# Patient Record
Sex: Female | Born: 1940
Health system: Southern US, Community
[De-identification: ages and names within clinical notes are randomized; demographics above are authoritative.]

## PROBLEM LIST (undated history)

## (undated) DIAGNOSIS — E785 Hyperlipidemia, unspecified: Secondary | ICD-10-CM

## (undated) DIAGNOSIS — I471 Supraventricular tachycardia, unspecified: Secondary | ICD-10-CM

## (undated) DIAGNOSIS — R42 Dizziness and giddiness: Secondary | ICD-10-CM

## (undated) DIAGNOSIS — G629 Polyneuropathy, unspecified: Secondary | ICD-10-CM

## (undated) DIAGNOSIS — D649 Anemia, unspecified: Secondary | ICD-10-CM

## (undated) DIAGNOSIS — IMO0001 Reserved for inherently not codable concepts without codable children: Secondary | ICD-10-CM

## (undated) DIAGNOSIS — F329 Major depressive disorder, single episode, unspecified: Secondary | ICD-10-CM

## (undated) DIAGNOSIS — Z8719 Personal history of other diseases of the digestive system: Secondary | ICD-10-CM

## (undated) DIAGNOSIS — G9520 Unspecified cord compression: Secondary | ICD-10-CM

## (undated) DIAGNOSIS — R519 Headache, unspecified: Secondary | ICD-10-CM

## (undated) DIAGNOSIS — F32A Depression, unspecified: Secondary | ICD-10-CM

## (undated) DIAGNOSIS — T4145XA Adverse effect of unspecified anesthetic, initial encounter: Secondary | ICD-10-CM

## (undated) DIAGNOSIS — R112 Nausea with vomiting, unspecified: Secondary | ICD-10-CM

## (undated) DIAGNOSIS — M797 Fibromyalgia: Secondary | ICD-10-CM

## (undated) DIAGNOSIS — K591 Functional diarrhea: Secondary | ICD-10-CM

## (undated) DIAGNOSIS — M1990 Unspecified osteoarthritis, unspecified site: Secondary | ICD-10-CM

## (undated) DIAGNOSIS — F410 Panic disorder [episodic paroxysmal anxiety] without agoraphobia: Secondary | ICD-10-CM

## (undated) DIAGNOSIS — R51 Headache: Secondary | ICD-10-CM

## (undated) DIAGNOSIS — F419 Anxiety disorder, unspecified: Secondary | ICD-10-CM

## (undated) DIAGNOSIS — J189 Pneumonia, unspecified organism: Secondary | ICD-10-CM

## (undated) DIAGNOSIS — E119 Type 2 diabetes mellitus without complications: Secondary | ICD-10-CM

## (undated) DIAGNOSIS — M810 Age-related osteoporosis without current pathological fracture: Secondary | ICD-10-CM

## (undated) DIAGNOSIS — I499 Cardiac arrhythmia, unspecified: Secondary | ICD-10-CM

## (undated) DIAGNOSIS — T8859XA Other complications of anesthesia, initial encounter: Secondary | ICD-10-CM

## (undated) DIAGNOSIS — I4719 Other supraventricular tachycardia: Secondary | ICD-10-CM

## (undated) DIAGNOSIS — I10 Essential (primary) hypertension: Secondary | ICD-10-CM

## (undated) DIAGNOSIS — C801 Malignant (primary) neoplasm, unspecified: Secondary | ICD-10-CM

## (undated) DIAGNOSIS — K219 Gastro-esophageal reflux disease without esophagitis: Secondary | ICD-10-CM

## (undated) DIAGNOSIS — G2581 Restless legs syndrome: Secondary | ICD-10-CM

## (undated) DIAGNOSIS — Z9889 Other specified postprocedural states: Secondary | ICD-10-CM

## (undated) HISTORY — PX: BACK SURGERY: SHX140

## (undated) HISTORY — DX: Dizziness and giddiness: R42

## (undated) HISTORY — PX: KNEE SURGERY: SHX244

## (undated) HISTORY — PX: ABDOMINAL HYSTERECTOMY: SHX81

## (undated) HISTORY — PX: EXCISION MORTON'S NEUROMA: SHX5013

## (undated) HISTORY — PX: UPPER GASTROINTESTINAL ENDOSCOPY: SHX188

## (undated) HISTORY — DX: Age-related osteoporosis without current pathological fracture: M81.0

## (undated) HISTORY — PX: WRIST SURGERY: SHX841

## (undated) HISTORY — PX: NECK SURGERY: SHX720

## (undated) HISTORY — PX: TONSILLECTOMY: SUR1361

## (undated) HISTORY — PX: EYE SURGERY: SHX253

## (undated) HISTORY — PX: FRACTURE SURGERY: SHX138

## (undated) HISTORY — PX: CHOLECYSTECTOMY: SHX55

## (undated) HISTORY — PX: ANKLE SURGERY: SHX546

## (undated) HISTORY — PX: COLONOSCOPY: SHX174

## (undated) HISTORY — PX: ROTATOR CUFF REPAIR: SHX139

---

## 1997-07-09 ENCOUNTER — Ambulatory Visit (HOSPITAL_COMMUNITY): Admission: RE | Admit: 1997-07-09 | Discharge: 1997-07-09 | Payer: Self-pay | Admitting: *Deleted

## 1997-08-18 ENCOUNTER — Ambulatory Visit (HOSPITAL_COMMUNITY): Admission: RE | Admit: 1997-08-18 | Discharge: 1997-08-18 | Payer: Self-pay | Admitting: *Deleted

## 1998-03-27 ENCOUNTER — Emergency Department (HOSPITAL_COMMUNITY): Admission: EM | Admit: 1998-03-27 | Discharge: 1998-03-27 | Payer: Self-pay | Admitting: Emergency Medicine

## 1998-11-05 ENCOUNTER — Encounter: Payer: Self-pay | Admitting: *Deleted

## 1998-11-05 ENCOUNTER — Ambulatory Visit (HOSPITAL_COMMUNITY): Admission: RE | Admit: 1998-11-05 | Discharge: 1998-11-05 | Payer: Self-pay | Admitting: *Deleted

## 1998-11-29 ENCOUNTER — Encounter: Payer: Self-pay | Admitting: Unknown Physician Specialty

## 1998-11-29 ENCOUNTER — Ambulatory Visit (HOSPITAL_COMMUNITY): Admission: RE | Admit: 1998-11-29 | Discharge: 1998-11-29 | Payer: Self-pay | Admitting: Unknown Physician Specialty

## 1999-04-14 ENCOUNTER — Observation Stay (HOSPITAL_COMMUNITY): Admission: EM | Admit: 1999-04-14 | Discharge: 1999-04-15 | Payer: Self-pay | Admitting: Emergency Medicine

## 1999-04-14 ENCOUNTER — Encounter: Payer: Self-pay | Admitting: Emergency Medicine

## 1999-06-24 ENCOUNTER — Encounter: Payer: Self-pay | Admitting: Unknown Physician Specialty

## 1999-06-24 ENCOUNTER — Ambulatory Visit (HOSPITAL_COMMUNITY): Admission: RE | Admit: 1999-06-24 | Discharge: 1999-06-24 | Payer: Self-pay | Admitting: Unknown Physician Specialty

## 1999-08-10 ENCOUNTER — Ambulatory Visit (HOSPITAL_COMMUNITY): Admission: RE | Admit: 1999-08-10 | Discharge: 1999-08-10 | Payer: Self-pay | Admitting: Internal Medicine

## 1999-08-10 ENCOUNTER — Encounter: Payer: Self-pay | Admitting: Internal Medicine

## 1999-11-07 ENCOUNTER — Encounter: Payer: Self-pay | Admitting: *Deleted

## 1999-11-07 ENCOUNTER — Ambulatory Visit (HOSPITAL_COMMUNITY): Admission: RE | Admit: 1999-11-07 | Discharge: 1999-11-07 | Payer: Self-pay | Admitting: *Deleted

## 2000-10-08 ENCOUNTER — Encounter: Admission: RE | Admit: 2000-10-08 | Discharge: 2000-10-25 | Payer: Self-pay | Admitting: Family Medicine

## 2000-10-16 ENCOUNTER — Emergency Department (HOSPITAL_COMMUNITY): Admission: EM | Admit: 2000-10-16 | Discharge: 2000-10-17 | Payer: Self-pay | Admitting: Emergency Medicine

## 2000-11-14 ENCOUNTER — Encounter: Payer: Self-pay | Admitting: Neurological Surgery

## 2000-11-15 ENCOUNTER — Observation Stay (HOSPITAL_COMMUNITY): Admission: RE | Admit: 2000-11-15 | Discharge: 2000-11-16 | Payer: Self-pay | Admitting: Neurological Surgery

## 2000-11-15 ENCOUNTER — Encounter: Payer: Self-pay | Admitting: Neurological Surgery

## 2000-12-12 ENCOUNTER — Encounter: Admission: RE | Admit: 2000-12-12 | Discharge: 2000-12-12 | Payer: Self-pay | Admitting: Neurological Surgery

## 2000-12-12 ENCOUNTER — Encounter: Payer: Self-pay | Admitting: Neurological Surgery

## 2001-06-12 ENCOUNTER — Emergency Department (HOSPITAL_COMMUNITY): Admission: EM | Admit: 2001-06-12 | Discharge: 2001-06-12 | Payer: Self-pay | Admitting: Emergency Medicine

## 2001-06-18 ENCOUNTER — Ambulatory Visit (HOSPITAL_BASED_OUTPATIENT_CLINIC_OR_DEPARTMENT_OTHER): Admission: RE | Admit: 2001-06-18 | Discharge: 2001-06-18 | Payer: Self-pay | Admitting: Orthopedic Surgery

## 2001-07-30 ENCOUNTER — Encounter: Payer: Self-pay | Admitting: *Deleted

## 2001-07-30 ENCOUNTER — Encounter: Admission: RE | Admit: 2001-07-30 | Discharge: 2001-07-30 | Payer: Self-pay | Admitting: *Deleted

## 2001-09-03 ENCOUNTER — Emergency Department (HOSPITAL_COMMUNITY): Admission: EM | Admit: 2001-09-03 | Discharge: 2001-09-03 | Payer: Self-pay | Admitting: Emergency Medicine

## 2001-09-03 ENCOUNTER — Encounter: Payer: Self-pay | Admitting: Emergency Medicine

## 2001-09-05 ENCOUNTER — Encounter: Payer: Self-pay | Admitting: *Deleted

## 2001-09-05 ENCOUNTER — Encounter: Admission: RE | Admit: 2001-09-05 | Discharge: 2001-09-05 | Payer: Self-pay | Admitting: *Deleted

## 2001-09-10 ENCOUNTER — Encounter: Payer: Self-pay | Admitting: *Deleted

## 2001-09-11 ENCOUNTER — Encounter (INDEPENDENT_AMBULATORY_CARE_PROVIDER_SITE_OTHER): Payer: Self-pay | Admitting: Specialist

## 2001-09-11 ENCOUNTER — Ambulatory Visit (HOSPITAL_COMMUNITY): Admission: RE | Admit: 2001-09-11 | Discharge: 2001-09-12 | Payer: Self-pay | Admitting: *Deleted

## 2001-09-11 ENCOUNTER — Encounter: Payer: Self-pay | Admitting: *Deleted

## 2002-05-26 ENCOUNTER — Encounter: Payer: Self-pay | Admitting: Emergency Medicine

## 2002-05-26 ENCOUNTER — Emergency Department (HOSPITAL_COMMUNITY): Admission: EM | Admit: 2002-05-26 | Discharge: 2002-05-26 | Payer: Self-pay

## 2002-08-22 ENCOUNTER — Encounter: Payer: Self-pay | Admitting: *Deleted

## 2002-08-22 ENCOUNTER — Encounter: Admission: RE | Admit: 2002-08-22 | Discharge: 2002-08-22 | Payer: Self-pay | Admitting: *Deleted

## 2002-11-01 ENCOUNTER — Encounter: Payer: Self-pay | Admitting: Emergency Medicine

## 2002-11-01 ENCOUNTER — Emergency Department (HOSPITAL_COMMUNITY): Admission: EM | Admit: 2002-11-01 | Discharge: 2002-11-01 | Payer: Self-pay | Admitting: Emergency Medicine

## 2002-12-31 ENCOUNTER — Ambulatory Visit (HOSPITAL_COMMUNITY): Admission: RE | Admit: 2002-12-31 | Discharge: 2002-12-31 | Payer: Self-pay | Admitting: Family Medicine

## 2003-09-03 ENCOUNTER — Ambulatory Visit (HOSPITAL_COMMUNITY): Admission: RE | Admit: 2003-09-03 | Discharge: 2003-09-03 | Payer: Self-pay | Admitting: Internal Medicine

## 2003-12-28 ENCOUNTER — Ambulatory Visit (HOSPITAL_COMMUNITY): Admission: RE | Admit: 2003-12-28 | Discharge: 2003-12-28 | Payer: Self-pay | Admitting: Specialist

## 2004-01-25 ENCOUNTER — Ambulatory Visit (HOSPITAL_COMMUNITY): Admission: RE | Admit: 2004-01-25 | Discharge: 2004-01-25 | Payer: Self-pay | Admitting: Specialist

## 2004-01-27 ENCOUNTER — Ambulatory Visit: Payer: Self-pay | Admitting: Family Medicine

## 2004-04-27 ENCOUNTER — Ambulatory Visit: Payer: Self-pay | Admitting: Family Medicine

## 2004-05-30 ENCOUNTER — Emergency Department (HOSPITAL_COMMUNITY): Admission: EM | Admit: 2004-05-30 | Discharge: 2004-05-30 | Payer: Self-pay | Admitting: Emergency Medicine

## 2004-07-05 ENCOUNTER — Ambulatory Visit: Payer: Self-pay | Admitting: Family Medicine

## 2004-07-27 ENCOUNTER — Ambulatory Visit: Payer: Self-pay | Admitting: Family Medicine

## 2004-09-14 ENCOUNTER — Ambulatory Visit: Payer: Self-pay | Admitting: Family Medicine

## 2004-10-04 ENCOUNTER — Ambulatory Visit: Payer: Self-pay | Admitting: Family Medicine

## 2004-12-13 ENCOUNTER — Ambulatory Visit: Payer: Self-pay | Admitting: Family Medicine

## 2004-12-16 ENCOUNTER — Ambulatory Visit (HOSPITAL_COMMUNITY): Admission: RE | Admit: 2004-12-16 | Discharge: 2004-12-16 | Payer: Self-pay | Admitting: *Deleted

## 2004-12-20 ENCOUNTER — Encounter: Admission: RE | Admit: 2004-12-20 | Discharge: 2004-12-20 | Payer: Self-pay | Admitting: Family Medicine

## 2004-12-27 ENCOUNTER — Ambulatory Visit: Payer: Self-pay | Admitting: Family Medicine

## 2005-01-11 ENCOUNTER — Ambulatory Visit: Payer: Self-pay | Admitting: Family Medicine

## 2005-02-22 ENCOUNTER — Ambulatory Visit: Payer: Self-pay | Admitting: Family Medicine

## 2005-03-20 ENCOUNTER — Inpatient Hospital Stay (HOSPITAL_COMMUNITY): Admission: RE | Admit: 2005-03-20 | Discharge: 2005-03-22 | Payer: Self-pay | Admitting: Neurological Surgery

## 2005-05-30 ENCOUNTER — Encounter: Admission: RE | Admit: 2005-05-30 | Discharge: 2005-06-15 | Payer: Self-pay | Admitting: Neurological Surgery

## 2005-05-31 ENCOUNTER — Ambulatory Visit: Payer: Self-pay | Admitting: Family Medicine

## 2005-06-26 ENCOUNTER — Encounter: Admission: RE | Admit: 2005-06-26 | Discharge: 2005-06-26 | Payer: Self-pay | Admitting: Family Medicine

## 2005-09-07 ENCOUNTER — Ambulatory Visit: Payer: Self-pay | Admitting: Family Medicine

## 2005-12-18 ENCOUNTER — Ambulatory Visit: Payer: Self-pay | Admitting: Family Medicine

## 2006-02-14 ENCOUNTER — Encounter: Admission: RE | Admit: 2006-02-14 | Discharge: 2006-02-14 | Payer: Self-pay | Admitting: Family Medicine

## 2006-02-15 ENCOUNTER — Ambulatory Visit: Payer: Self-pay | Admitting: Family Medicine

## 2006-03-19 ENCOUNTER — Ambulatory Visit: Payer: Self-pay | Admitting: Family Medicine

## 2006-04-11 ENCOUNTER — Ambulatory Visit: Payer: Self-pay | Admitting: Family Medicine

## 2006-06-27 ENCOUNTER — Ambulatory Visit: Payer: Self-pay | Admitting: Family Medicine

## 2006-09-12 ENCOUNTER — Emergency Department (HOSPITAL_COMMUNITY): Admission: EM | Admit: 2006-09-12 | Discharge: 2006-09-12 | Payer: Self-pay | Admitting: Emergency Medicine

## 2007-08-18 ENCOUNTER — Emergency Department (HOSPITAL_COMMUNITY): Admission: EM | Admit: 2007-08-18 | Discharge: 2007-08-18 | Payer: Self-pay | Admitting: Family Medicine

## 2007-09-24 ENCOUNTER — Encounter: Admission: RE | Admit: 2007-09-24 | Discharge: 2007-09-24 | Payer: Self-pay | Admitting: Family Medicine

## 2008-09-24 ENCOUNTER — Encounter: Admission: RE | Admit: 2008-09-24 | Discharge: 2008-09-24 | Payer: Self-pay | Admitting: Family Medicine

## 2008-10-20 ENCOUNTER — Ambulatory Visit (HOSPITAL_COMMUNITY): Admission: RE | Admit: 2008-10-20 | Discharge: 2008-10-20 | Payer: Self-pay | Admitting: Family Medicine

## 2008-10-20 ENCOUNTER — Encounter: Payer: Self-pay | Admitting: Family Medicine

## 2009-03-14 ENCOUNTER — Inpatient Hospital Stay (HOSPITAL_COMMUNITY): Admission: EM | Admit: 2009-03-14 | Discharge: 2009-03-16 | Payer: Self-pay | Admitting: Emergency Medicine

## 2009-09-30 ENCOUNTER — Encounter: Admission: RE | Admit: 2009-09-30 | Discharge: 2009-09-30 | Payer: Self-pay | Admitting: *Deleted

## 2010-03-06 ENCOUNTER — Encounter: Payer: Self-pay | Admitting: Family Medicine

## 2010-04-17 ENCOUNTER — Emergency Department (INDEPENDENT_AMBULATORY_CARE_PROVIDER_SITE_OTHER): Payer: Medicare Other

## 2010-04-17 ENCOUNTER — Emergency Department (HOSPITAL_BASED_OUTPATIENT_CLINIC_OR_DEPARTMENT_OTHER)
Admission: EM | Admit: 2010-04-17 | Discharge: 2010-04-17 | Disposition: A | Discharge status: Home / Self Care | Payer: Medicare Other | Attending: Emergency Medicine | Admitting: Emergency Medicine

## 2010-04-17 DIAGNOSIS — R197 Diarrhea, unspecified: Secondary | ICD-10-CM | POA: Insufficient documentation

## 2010-04-17 DIAGNOSIS — IMO0001 Reserved for inherently not codable concepts without codable children: Secondary | ICD-10-CM | POA: Insufficient documentation

## 2010-04-17 DIAGNOSIS — R110 Nausea: Secondary | ICD-10-CM

## 2010-04-17 DIAGNOSIS — R109 Unspecified abdominal pain: Secondary | ICD-10-CM

## 2010-04-17 DIAGNOSIS — Z79899 Other long term (current) drug therapy: Secondary | ICD-10-CM | POA: Insufficient documentation

## 2010-04-17 DIAGNOSIS — E1169 Type 2 diabetes mellitus with other specified complication: Secondary | ICD-10-CM | POA: Insufficient documentation

## 2010-04-17 LAB — URINE MICROSCOPIC-ADD ON

## 2010-04-17 LAB — URINALYSIS, ROUTINE W REFLEX MICROSCOPIC
Bilirubin Urine: NEGATIVE
Glucose, UA: 250 mg/dL — AB
Hgb urine dipstick: NEGATIVE
Ketones, ur: NEGATIVE mg/dL
Leukocytes, UA: NEGATIVE
Nitrite: NEGATIVE
Protein, ur: 30 mg/dL — AB
Specific Gravity, Urine: 1.015 (ref 1.005–1.030)
Urobilinogen, UA: 0.2 mg/dL (ref 0.0–1.0)
pH: 7.5 (ref 5.0–8.0)

## 2010-04-17 LAB — CBC
HCT: 41.4 % (ref 36.0–46.0)
Hemoglobin: 14.3 g/dL (ref 12.0–15.0)
MCH: 29.2 pg (ref 26.0–34.0)
MCHC: 34.5 g/dL (ref 30.0–36.0)
MCV: 84.5 fL (ref 78.0–100.0)
Platelets: 286 10*3/uL (ref 150–400)
RBC: 4.9 MIL/uL (ref 3.87–5.11)
RDW: 12.7 % (ref 11.5–15.5)
WBC: 9.5 10*3/uL (ref 4.0–10.5)

## 2010-04-17 LAB — DIFFERENTIAL
Basophils Absolute: 0 10*3/uL (ref 0.0–0.1)
Basophils Relative: 0 % (ref 0–1)
Eosinophils Absolute: 0.1 10*3/uL (ref 0.0–0.7)
Eosinophils Relative: 1 % (ref 0–5)
Lymphocytes Relative: 24 % (ref 12–46)
Lymphs Abs: 2.3 10*3/uL (ref 0.7–4.0)
Monocytes Absolute: 0.6 10*3/uL (ref 0.1–1.0)
Monocytes Relative: 7 % (ref 3–12)
Neutro Abs: 6.5 10*3/uL (ref 1.7–7.7)
Neutrophils Relative %: 68 % (ref 43–77)

## 2010-04-17 LAB — LIPASE, BLOOD: Lipase: 133 U/L (ref 23–300)

## 2010-04-17 LAB — COMPREHENSIVE METABOLIC PANEL
ALT: 39 U/L — ABNORMAL HIGH (ref 0–35)
AST: 30 U/L (ref 0–37)
Albumin: 4 g/dL (ref 3.5–5.2)
Alkaline Phosphatase: 73 U/L (ref 39–117)
BUN: 13 mg/dL (ref 6–23)
CO2: 22 mEq/L (ref 19–32)
Calcium: 9.5 mg/dL (ref 8.4–10.5)
Chloride: 104 mEq/L (ref 96–112)
Creatinine, Ser: 0.6 mg/dL (ref 0.4–1.2)
GFR calc Af Amer: >60 mL/min (ref >60)
GFR calc non Af Amer: >60 mL/min (ref >60)
Glucose, Bld: 223 mg/dL — ABNORMAL HIGH (ref 70–99)
Potassium: 4.4 mEq/L (ref 3.5–5.1)
Sodium: 137 mEq/L (ref 135–145)
Total Bilirubin: 0.8 mg/dL (ref 0.3–1.2)
Total Protein: 7.1 g/dL (ref 6.0–8.3)

## 2010-04-17 LAB — DIGOXIN LEVEL: Digoxin Level: 0.9 ng/mL (ref 0.8–2.0)

## 2010-04-20 ENCOUNTER — Emergency Department (HOSPITAL_BASED_OUTPATIENT_CLINIC_OR_DEPARTMENT_OTHER)
Admission: EM | Admit: 2010-04-20 | Discharge: 2010-04-20 | Disposition: A | Discharge status: Home / Self Care | Payer: Medicare Other | Attending: Emergency Medicine | Admitting: Emergency Medicine

## 2010-04-20 DIAGNOSIS — R112 Nausea with vomiting, unspecified: Secondary | ICD-10-CM | POA: Insufficient documentation

## 2010-04-20 DIAGNOSIS — R197 Diarrhea, unspecified: Secondary | ICD-10-CM | POA: Insufficient documentation

## 2010-04-20 DIAGNOSIS — Z79899 Other long term (current) drug therapy: Secondary | ICD-10-CM | POA: Insufficient documentation

## 2010-04-20 DIAGNOSIS — E119 Type 2 diabetes mellitus without complications: Secondary | ICD-10-CM | POA: Insufficient documentation

## 2010-04-20 DIAGNOSIS — IMO0001 Reserved for inherently not codable concepts without codable children: Secondary | ICD-10-CM | POA: Insufficient documentation

## 2010-04-20 LAB — COMPREHENSIVE METABOLIC PANEL
ALT: 38 U/L — ABNORMAL HIGH (ref 0–35)
AST: 31 U/L (ref 0–37)
Albumin: 4 g/dL (ref 3.5–5.2)
Alkaline Phosphatase: 75 U/L (ref 39–117)
BUN: 18 mg/dL (ref 6–23)
CO2: 23 mEq/L (ref 19–32)
Calcium: 9.5 mg/dL (ref 8.4–10.5)
Chloride: 104 mEq/L (ref 96–112)
Creatinine, Ser: 0.7 mg/dL (ref 0.4–1.2)
GFR calc Af Amer: >60 mL/min (ref >60)
GFR calc non Af Amer: >60 mL/min (ref >60)
Glucose, Bld: 197 mg/dL — ABNORMAL HIGH (ref 70–99)
Potassium: 4.3 mEq/L (ref 3.5–5.1)
Sodium: 140 mEq/L (ref 135–145)
Total Bilirubin: 0.6 mg/dL (ref 0.3–1.2)
Total Protein: 7 g/dL (ref 6.0–8.3)

## 2010-04-20 LAB — LIPASE, BLOOD: Lipase: 102 U/L (ref 23–300)

## 2010-05-02 LAB — DIFFERENTIAL
Basophils Absolute: 0 10*3/uL (ref 0.0–0.1)
Basophils Relative: 1 % (ref 0–1)
Eosinophils Absolute: 0.2 10*3/uL (ref 0.0–0.7)
Eosinophils Relative: 3 % (ref 0–5)
Lymphocytes Relative: 34 % (ref 12–46)
Lymphs Abs: 2.7 10*3/uL (ref 0.7–4.0)
Monocytes Absolute: 0.6 10*3/uL (ref 0.1–1.0)
Monocytes Relative: 7 % (ref 3–12)
Neutro Abs: 4.3 10*3/uL (ref 1.7–7.7)
Neutrophils Relative %: 55 % (ref 43–77)

## 2010-05-02 LAB — GLUCOSE, CAPILLARY
Glucose-Capillary: 131 mg/dL — ABNORMAL HIGH (ref 70–99)
Glucose-Capillary: 169 mg/dL — ABNORMAL HIGH (ref 70–99)
Glucose-Capillary: 195 mg/dL — ABNORMAL HIGH (ref 70–99)
Glucose-Capillary: 221 mg/dL — ABNORMAL HIGH (ref 70–99)

## 2010-05-02 LAB — HEPARIN LEVEL (UNFRACTIONATED)
Heparin Unfractionated: 0.14 IU/mL — ABNORMAL LOW (ref 0.30–0.70)
Heparin Unfractionated: <0.1 IU/mL — ABNORMAL LOW (ref 0.30–0.70)

## 2010-05-02 LAB — CARDIAC PANEL(CRET KIN+CKTOT+MB+TROPI)
CK, MB: 1 ng/mL (ref 0.3–4.0)
CK, MB: 1.1 ng/mL (ref 0.3–4.0)
Relative Index: INVALID (ref 0.0–2.5)
Relative Index: INVALID (ref 0.0–2.5)
Total CK: 58 U/L (ref 7–177)
Total CK: 66 U/L (ref 7–177)
Troponin I: 0.01 ng/mL (ref 0.00–0.06)
Troponin I: 0.01 ng/mL (ref 0.00–0.06)

## 2010-05-02 LAB — POCT CARDIAC MARKERS
CKMB, poc: <1 ng/mL — ABNORMAL LOW (ref 1.0–8.0)
Myoglobin, poc: 42.2 ng/mL (ref 12–200)
Troponin i, poc: <0.05 ng/mL (ref 0.00–0.09)

## 2010-05-02 LAB — CK TOTAL AND CKMB (NOT AT ARMC)
CK, MB: 1.2 ng/mL (ref 0.3–4.0)
Relative Index: INVALID (ref 0.0–2.5)
Total CK: 66 U/L (ref 7–177)

## 2010-05-02 LAB — CBC
HCT: 36.9 % (ref 36.0–46.0)
HCT: 39.1 % (ref 36.0–46.0)
Hemoglobin: 12.5 g/dL (ref 12.0–15.0)
Hemoglobin: 13.2 g/dL (ref 12.0–15.0)
MCHC: 33.8 g/dL (ref 30.0–36.0)
MCHC: 33.9 g/dL (ref 30.0–36.0)
MCV: 88.3 fL (ref 78.0–100.0)
MCV: 88.3 fL (ref 78.0–100.0)
Platelets: 213 10*3/uL (ref 150–400)
Platelets: 217 10*3/uL (ref 150–400)
RBC: 4.18 MIL/uL (ref 3.87–5.11)
RBC: 4.43 MIL/uL (ref 3.87–5.11)
RDW: 13.2 % (ref 11.5–15.5)
RDW: 13.5 % (ref 11.5–15.5)
WBC: 7.8 10*3/uL (ref 4.0–10.5)
WBC: 9 10*3/uL (ref 4.0–10.5)

## 2010-05-02 LAB — COMPREHENSIVE METABOLIC PANEL
ALT: 38 U/L — ABNORMAL HIGH (ref 0–35)
AST: 34 U/L (ref 0–37)
Albumin: 3.7 g/dL (ref 3.5–5.2)
Alkaline Phosphatase: 69 U/L (ref 39–117)
BUN: 13 mg/dL (ref 6–23)
CO2: 26 mEq/L (ref 19–32)
Calcium: 9.2 mg/dL (ref 8.4–10.5)
Chloride: 104 mEq/L (ref 96–112)
Creatinine, Ser: 0.73 mg/dL (ref 0.4–1.2)
GFR calc Af Amer: >60 mL/min (ref >60)
GFR calc non Af Amer: >60 mL/min (ref >60)
Glucose, Bld: 197 mg/dL — ABNORMAL HIGH (ref 70–99)
Potassium: 3.7 mEq/L (ref 3.5–5.1)
Sodium: 138 mEq/L (ref 135–145)
Total Bilirubin: 0.7 mg/dL (ref 0.3–1.2)
Total Protein: 6.2 g/dL (ref 6.0–8.3)

## 2010-05-02 LAB — POCT I-STAT, CHEM 8
BUN: 14 mg/dL (ref 6–23)
Calcium, Ion: 1.15 mmol/L (ref 1.12–1.32)
Chloride: 105 mEq/L (ref 96–112)
Creatinine, Ser: 0.5 mg/dL (ref 0.4–1.2)
Glucose, Bld: 111 mg/dL — ABNORMAL HIGH (ref 70–99)
HCT: 39 % (ref 36.0–46.0)
Hemoglobin: 13.3 g/dL (ref 12.0–15.0)
Potassium: 3.7 mEq/L (ref 3.5–5.1)
Sodium: 138 mEq/L (ref 135–145)
TCO2: 27 mmol/L (ref 0–100)

## 2010-05-02 LAB — LIPID PANEL
Cholesterol: 172 mg/dL (ref 0–200)
HDL: 27 mg/dL — ABNORMAL LOW (ref >39)
LDL Cholesterol: UNDETERMINED mg/dL (ref 0–99)
Total CHOL/HDL Ratio: 6.4 RATIO
Triglycerides: 741 mg/dL — ABNORMAL HIGH (ref <150)
VLDL: UNDETERMINED mg/dL (ref 0–40)

## 2010-05-02 LAB — APTT: aPTT: 48 seconds — ABNORMAL HIGH (ref 24–37)

## 2010-05-02 LAB — BASIC METABOLIC PANEL
BUN: 14 mg/dL (ref 6–23)
CO2: 22 mEq/L (ref 19–32)
Calcium: 9.1 mg/dL (ref 8.4–10.5)
Chloride: 104 mEq/L (ref 96–112)
Creatinine, Ser: 0.66 mg/dL (ref 0.4–1.2)
GFR calc Af Amer: >60 mL/min (ref >60)
GFR calc non Af Amer: >60 mL/min (ref >60)
Glucose, Bld: 169 mg/dL — ABNORMAL HIGH (ref 70–99)
Potassium: 4 mEq/L (ref 3.5–5.1)
Sodium: 136 mEq/L (ref 135–145)

## 2010-05-02 LAB — HEMOGLOBIN A1C
Hgb A1c MFr Bld: 7.9 % — ABNORMAL HIGH (ref 4.6–6.1)
Mean Plasma Glucose: 180 mg/dL

## 2010-05-02 LAB — TSH: TSH: 1.075 u[IU]/mL (ref 0.350–4.500)

## 2010-05-02 LAB — PROTIME-INR
INR: 0.91 (ref 0.00–1.49)
Prothrombin Time: 12.2 seconds (ref 11.6–15.2)

## 2010-05-02 LAB — TROPONIN I: Troponin I: 0.02 ng/mL (ref 0.00–0.06)

## 2010-05-06 LAB — GLUCOSE, CAPILLARY
Glucose-Capillary: 161 mg/dL — ABNORMAL HIGH (ref 70–99)
Glucose-Capillary: 210 mg/dL — ABNORMAL HIGH (ref 70–99)

## 2010-06-06 ENCOUNTER — Emergency Department (HOSPITAL_COMMUNITY)
Admission: EM | Admit: 2010-06-06 | Discharge: 2010-06-06 | Disposition: A | Discharge status: Home / Self Care | Payer: Medicare Other | Attending: Emergency Medicine | Admitting: Emergency Medicine

## 2010-06-06 DIAGNOSIS — K5289 Other specified noninfective gastroenteritis and colitis: Secondary | ICD-10-CM | POA: Insufficient documentation

## 2010-06-06 DIAGNOSIS — E860 Dehydration: Secondary | ICD-10-CM | POA: Insufficient documentation

## 2010-06-06 LAB — COMPREHENSIVE METABOLIC PANEL
ALT: 36 U/L — ABNORMAL HIGH (ref 0–35)
AST: 39 U/L — ABNORMAL HIGH (ref 0–37)
Albumin: 4.2 g/dL (ref 3.5–5.2)
Alkaline Phosphatase: 62 U/L (ref 39–117)
BUN: 21 mg/dL (ref 6–23)
CO2: 22 mEq/L (ref 19–32)
Calcium: 10.2 mg/dL (ref 8.4–10.5)
Chloride: 101 mEq/L (ref 96–112)
Creatinine, Ser: 1.44 mg/dL — ABNORMAL HIGH (ref 0.4–1.2)
GFR calc Af Amer: 44 mL/min — ABNORMAL LOW (ref >60)
GFR calc non Af Amer: 36 mL/min — ABNORMAL LOW (ref >60)
Glucose, Bld: 246 mg/dL — ABNORMAL HIGH (ref 70–99)
Potassium: 4.4 mEq/L (ref 3.5–5.1)
Sodium: 135 mEq/L (ref 135–145)
Total Bilirubin: 0.9 mg/dL (ref 0.3–1.2)
Total Protein: 7.4 g/dL (ref 6.0–8.3)

## 2010-06-06 LAB — DIFFERENTIAL
Basophils Absolute: 0 10*3/uL (ref 0.0–0.1)
Basophils Relative: 0 % (ref 0–1)
Eosinophils Absolute: 0.1 10*3/uL (ref 0.0–0.7)
Eosinophils Relative: 0 % (ref 0–5)
Lymphocytes Relative: 5 % — ABNORMAL LOW (ref 12–46)
Lymphs Abs: 1.1 10*3/uL (ref 0.7–4.0)
Monocytes Absolute: 1.4 10*3/uL — ABNORMAL HIGH (ref 0.1–1.0)
Monocytes Relative: 7 % (ref 3–12)
Neutro Abs: 18.5 10*3/uL — ABNORMAL HIGH (ref 1.7–7.7)
Neutrophils Relative %: 88 % — ABNORMAL HIGH (ref 43–77)

## 2010-06-06 LAB — CBC
HCT: 45.9 % (ref 36.0–46.0)
Hemoglobin: 15.5 g/dL — ABNORMAL HIGH (ref 12.0–15.0)
MCH: 29.4 pg (ref 26.0–34.0)
MCHC: 33.8 g/dL (ref 30.0–36.0)
MCV: 87.1 fL (ref 78.0–100.0)
Platelets: 294 10*3/uL (ref 150–400)
RBC: 5.27 MIL/uL — ABNORMAL HIGH (ref 3.87–5.11)
RDW: 12.7 % (ref 11.5–15.5)
WBC: 21.1 10*3/uL — ABNORMAL HIGH (ref 4.0–10.5)

## 2010-07-01 NOTE — H&P (Signed)
[place]. [facility]  Patient:    [name], DUQUETTE                        MRN: [id number] Adm. Date:  [id number] Attending:  Doug Sou CC:         Dr. Dewaine Conger                         History and Physical  REASON FOR ADMISSION: Chest discomfort.  HISTORY:  This 70 year old female has a long-standing history of severe anxiety and tenseness.  She has a history of PAT, which has been treated medically.  She has been under marked situational stress.  She has had rare episodes of PAT.  She has had recent fibromyalgia and was treated by Dr. Estill Bakes recently with some prednisone and has been on Vioxx for approximately one year.  She had the onset of lower sternal and epigastric and mid-sternal chest discomfort described as heaviness, pressure, and tightness.  She uses the words diaphoresis, paresthesia, and sweaty when describing her pain.  She took a nitroglycerin that was in the house that belonged to her mother with relief of the pain.  Her daughter called EMS and she was transported here.  The symptoms lasted around 20 minutes and were nonradiating.  She described sweatiness in both extremities at the time.  The emergency room doctor felt that she should be admitted to be ruled out for a myocardial infarction.  PAST MEDICAL HISTORY:  No diabetes, hiatal hernia, or hypertension.   Does have  fibromyalgia.  PAST SURGICAL HISTORY: 1. Vaginal hysterectomy. 2. Wrist surgery.  ALLERGIES:  None.  CURRENT MEDICATIONS: 1. Calan SR 120 mg b.i.d. 2. Lanoxin 0.25 mg daily. 3. Baby aspirin daily. 4. Vioxx daily. 5. Prozac 40 mg daily.  FAMILY HISTORY:  Mother has had a stroke after a catheterization previously that concerns her tremendously.  Father is deceased and had a past history of lymphoma. Her mother has recently moved in with her.  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  She quit smoking in 1893.   She has a  very tense and anxious ______.  Lives at home with  her husband; her mother has recently moved in with her.  REVIEW OF SYSTEMS:  She says she does not feel good.  Complains of fatigue, malaise, generalized aching and has been diagnosed with fibromyalgia.   Has chronic anxiety and depression.   Seen by Dr. Anne Hahn in the past and was thought to have possible panic disorder in the past.  She has some chronic dyspepsia.  Has frequency of urination.   Has no edema.  Remainder of review of systems unremarkable except as noted above.  PHYSICAL EXAMINATION:  On examination, she is a tense, anxious woman.  VITAL SIGNS:  Blood pressure 140/80, pulse 76.  Skin:  Warm and dry.  ENT:  Unremarkable.  Neck:  No carotid bruits or thyromegaly.  Lungs:  Clear.  Cardiac:  Normal S1, S2, no S3.  Abdomen:  Soft and nontender.  Extremities:  Femoral distal pulses are 2+, no edema is present.  12-lead ECG shows nonspecific ST changes.   Chest x-ray is unremarkable.  LABORATORY DATA:  ISTAT shows a potassium of 3.6, hemoglobin 14, hematocrit 40%, sodium 136.  IMPRESSION: 1. Single isolated episode of prolonged chest discomfort occurring at rest    with features suggestive of myocardial ischemia.  2. Anxiety and depression. 3. History of fibromyalgia. 4. History of PAT.  RECOMMENDATIONS:  Admit to rule myocardial infarction.  Begin Lovenox.  She says that she is frightened of having a catheterization because of her mother having had a stroke.  If she has no recurrent chest discomfort and rules out for an myocardial infarction, may consider an adenosine Cardiolite scan.  I suspect that the stress and anxiety have been playing large role in this. DD:  04/14/99 TD:  04/14/99 Job: [id number]

## 2010-07-01 NOTE — Op Note (Signed)
[place]  Patient:    DIANAH, PRUETT Visit Number: [id number] MRN: [id number]          Service Type: SUR Location: 3000 [id number] Attending Physician:  Jonne Ply Dictated by:   Stefani Dama, M.D. Proc. Date: 11/15/00 Admit Date:  11/15/2000                             Operative Report  PREOPERATIVE DIAGNOSIS:  Cervical spondylosis with right cervical radiculopathy, C4-5, C5-6 and C6-7.  POSTOPERATIVE DIAGNOSIS:  Cervical spondylosis with right cervical radiculopathy, C4-5, C5-6 and C6-7.  OPERATION PERFORMED:  Anterior cervical diskectomy and arthrodesis, C4-5, C5-6 and C6-7.  SURGEON:  Stefani Dama, M.D.  ANESTHESIA:  General endotracheal.  INDICATIONS FOR PROCEDURE:  The patient is a 70 year old individual who has had significant neck, shoulder and right arm pain.  She has severe spondylitic disease at C4-5, 5-6 and 6-7 and this has been progressively getting worse for a number of months despite conservative management.  She is admitted to the operating room to undergo surgical decompression.  DESCRIPTION OF PROCEDURE:  The patient was brought to the operating room and placed on the table in supine position.  After smooth induction of general endotracheal anesthesia, she was placed in five pounds of halter traction, neck was shaved, prepped with DuraPrep and draped in a sterile fashion.  A transverse incision was made on the left side of the neck and carried down through the platysma.  The plane between the sternocleidomastoid and the strap muscles was dissected bluntly until the prevertebral space was identified, the first space being noted to be C3-4.  C4-5, C5-6 and then C6-7 were all sequentially identified and dissected free with the longus colli muscle being peeled laterally, so a Caspar retractor could be placed in the wound. Diskectomy was then performed at C4-5 using combination of curets and rongeurs to  remove the ventral aspect of the disk space.  The space was then opened further by placing an interbody spacer into the disk space and then using a high speed drill and a 2.3 mm dissecting tool to remove lateral bony osteophytes and posterior bony osteophytes from the inferior portion of the body of C4 and the superior margin of the body of C5 including the uncinate processes.  The compressive phenomenon was primarily bony with a markedly degenerated disk.  Once this was all relieved, hemostasis in the lateral gutters was obtained with some pledgets of Gelfoam soaked in thrombin which were later removed and then an 8 mm graft was used but cut down to the 7 mm size and this was sized and fitted appropriately and tamped into position. Attention was then turned to the next disk space and at C5-6 a similar procedure was carried out as was a similar procedure carried out at C6-7.  The C5-6 disk space was quite as severe as the C4-5 space.  At C6-7, however, the spondylitic process was not nearly as severe though it was moderate in degree. Adequate decompressions were performed at each level.  Once all the grafts were placed, traction was removed.  The neck was placed in slight flexion.  A 51 mm standard Synthes plate was then affixed to the ventral aspect of the vertebral bodies with eight locking 4 x 14 mm screws.  A number of the screws were required to be 4.35 x 14 mm because of decreased count of the 4 mm  screws.  Hemostasis was achieved in the soft tissues.  The area was copiously irrigated with antibiotic irrigating solution.  Radiographic positioning of the grafts and the plate was obtained and once this was verified, the area was then closed using 3-0 Vicryl in interrupted fashion to close the platysma and 3-0 Vicryl to close the subcuticular tissues.  Steri-Strips were then placed on the skin.  The patient tolerated the procedure well and was returned to the recovery room in a stable  condition.  Estimated blood loss was 250 cc. Dictated by:   Stefani Dama, M.D. Attending Physician:  Jonne Ply DD:  11/15/00 TD:  11/16/00 Job: [id number]

## 2010-07-01 NOTE — Op Note (Signed)
[name], [name] NO.:  [id number]   MEDICAL RECORD NO.:  [id number]          PATIENT TYPE:  INP   LOCATION:  3172                         FACILITY:  [facility]   PHYSICIAN:  Stefani Dama, M.D.  DATE OF BIRTH:  06-28-1940   DATE OF PROCEDURE:  03/20/2005  DATE OF DISCHARGE:                                 OPERATIVE REPORT   PREOPERATIVE DIAGNOSIS:  T12-L1 herniated nucleus pulposus with spondylosis  and myelopathy, stenosis.   POSTOPERATIVE DIAGNOSIS:  T12-L1 herniated nucleus pulposus with spondylosis  and myelopathy, stenosis.   PROCEDURE:  T12-L1 bilateral diskectomy, posterior lumbar interbody fusion  with PEEK bone spacers, fixation, T12-L1, with pedicle screws, and  posterolateral arthrodesis with local autograft, allograft and Infuse.   SURGEON:  Stefani Dama, M.D.   FIRST ASSISTANT:  Hilda Lias, M.D.   ANESTHESIA:  General endotracheal.   INDICATIONS:  [name] is a 70 year old individual who has had  significant problems with cervical spondylosis that I had taken care of in  the past.  She developed progressive difficulty with her gait, noticing  significant weakness in her gait, dysesthesias in the lower extremities, and  was felt to be myelopathic.  An MRI demonstrated the presence of a large,  chronically herniated nucleus pulposus at the T12-L1 level, which was  flattening just at the bottom of the conus.  The patient had no problems  with bowel or bladder control to this point but was having difficulties with  bilateral lower extremity pain and weakness.   PROCEDURE:  The patient was brought to the operating room supine on the  stretcher.  After smooth induction of general endotracheal anesthesia, she  was turned prone and the back was prepped with DuraPrep and draped in a  sterile fashion.  A midline incision was created over the region of T12-L1  and the spinous process of T12 was ultimately identified positively with  several radiographs, both in the AP and lateral projection, as the patient's  large body habitus made it very difficult visualize exact segment.  Once the  area was localized definitively, a laminotomy was then created first on the  left side, removing the inferior margin of the lamina at T12 out to and  including the entirety of the facet joint.  The disk space was identified  and as the lateral aspect of the dura was explored, it was noted that there  was a substantial mass from the dorsal aspect of the disk space that was  compressing the common dural tube.  By enlarging and performing a laminotomy  on the opposite side, this allowed for enough room to allow mobilization of  dura, just medial enough to allow entry into the disk space.  Once the disk  space was entered, a significant fragment of material could be removed from  the dorsal aspect of the disk space.  However, there was noted to be  significant attachment of the disk to a bony-covered ligament that was also  causing partial compression.  Then in a piecemeal fashion using a  combination of curettes, rongeurs and small osteotomes, the  disk itself was  removed along with posterior longitudinal ligament from this left-sided  approach.  Once the medial aspect was explored, it was felt that  decompression should be attempted from the opposite side, and this similarly  revealed some bulging material with bony cover.  This was incised and then  using a 2 mm Kerrison punch with the use the operating microscope and  microdissection technique, this space could be gradually dissected free and  decompressed.  The midline structures were then carefully decompressed using  a series of [employer] dissectors in a downgoing mode and small  curved osteotomes.  In the end the disk space was completely evacuated, the  subligamentous mass that had been present previously was removed, and the  alignment was more neutral.  At this point the  interspace was sized for an  appropriate-size spacer and it was felt that an 8 mm PLIF bone spacer would  be used.  This was filled with some of the autologous bone that was mixed  with 10 mL of AlphaGrain allograft bone substitute.  The disk space was then  shaped to the appropriate size using a series of disk space shapers out to  the 8 mm size.  Infuse was also prepared and a small strip of Infuse was  placed along the ventral aspect of the disk space.  Then the interbody space  was filled with a combination of allograft and autograft bone along with  bone marrow aspirate that was obtained from the left pedicle of the L1  vertebra.  Then a spacer was placed first on the left side and then a  similar procedure was carried out on right side.  Care was taken to make  sure that the common dural tube and the subligamentous space remained well-  decompressed during this procedure.  Pedicle entry sites were then chosen at  T12 and L1.  These were verified radiographically.  Once verified, a 5.5 mm  tap was used to tap down to 45 mm depth and 5.5 x 45 mm pedicle screws were  inserted to each of T12 and L1.  Again, radiographic localization of the  screws was obtained.  Cutout was checked for prior to placement of the  screws after tapping was performed.  The construct was then fixed in the  neutral position with 40 mm straight rods.  The system was torqued down into  its final configuration.  Prior to doing this, the posterolateral gutters  were decorticated and the remainder of the autograft and allograft mixture  was placed into these lateral gutters along with two strips of Infuse.  The  medial aspect was then grafted with a strip of the Vitoss tricalcium  phosphate that was soaked in the patient's own blood.  This was placed along  the laminotomy defect.  With this being completed, then the lumbodorsal  fascia was closed with Vicryl in interrupted fashion, 2-0 Vicryl was used in the  subcutaneous and subcuticular tissues, 3-0 Vicryl was used subcuticular.  Dermabond was placed on the skin.  The patient tolerated the procedure well.  Blood loss estimated about 400 mL.      Stefani Dama, M.D.  Electronically Signed     HJE/MEDQ  D:  03/20/2005  T:  03/20/2005  Job:  [id number]

## 2010-07-01 NOTE — Op Note (Signed)
[name], [name]                         ACCOUNT NO.:  [id number]   MEDICAL RECORD NO.:  [id number]                   PATIENT TYPE:  AMB   LOCATION:  DAY                                  FACILITY:  [facility]   PHYSICIAN:  Lionel December, M.D.                 DATE OF BIRTH:  03/13/1940   DATE OF PROCEDURE:  09/03/2003  DATE OF DISCHARGE:                                 OPERATIVE REPORT   PROCEDURE:  Total colonoscopy followed by esophagogastroduodenoscopy.   INDICATIONS:  Cartha is a 70 year old Caucasian female with chronic  diarrhea felt to be due to IBS.  She also has chronic GERD.  Symptoms are  well controlled with therapy.  It was recommended that she should have EGD  to make sure she does not have Barrett's esophagus.  She is undergoing  colonoscopy both for diagnostic and screening purposes.  Both procedures  were reviewed with the patient and informed consent was obtained.   PREOPERATIVE MEDICATIONS:  Demerol 50 mg IV, Versed 8 mg IV, Cetacaine spray  pharyngeal topical anesthesia.   FINDINGS:  Both procedures were performed in the endoscopy suite.  The  patient's vital signs and O2 saturation were monitored during the procedure  and remained stable.   TOTAL COLONOSCOPY:  Rectal examination was performed.  No abnormality noted  on the external or digital exam.  Olympus videoscope was placed in the  rectum and advanced under vision into the sigmoid colon and beyond.  Preparation was satisfactory.  The scope was passed to the cecum which was  identified by the ileocecal valve and appendiceal orifice.  Short segment of  terminal ileum was also examined and was normal.  Pictures taken for the  record.  As the scope was withdrawn, colonic mucosa was once again carefully  examined and was normal throughout.  Rectal mucosa similarly was normal.  Scope was retroflexed to examine the anorectal junction which is  unremarkable.  She had small hemorrhoids below the dentate line.   Endoscope  was withdrawn and the patient prepared for procedure #2.   ESOPHAGOGASTRODUODENOSCOPY:  The Olympus videoscope was passed through the  oropharynx without any difficulty into the esophagus.   Esophagus:  Mucosa of the esophagus was normal throughout.  The  squamocolumnar junction was unremarkable and no ring, stricture or hernia  was noted.   Stomach:  There was some bile in it but there was no food debris.  Stomach  distended very well on insufflation.  Folds of the proximal stomach were  normal.  Examination of the mucosa and body, pyloric channel, angularis,  fundus and cardia was normal.   Duodenum:  Examination of the bulb revealed normal mucosa.  Scope was passed  to the second part of the duodenum.  Mucosa and folds were normal.  The  endoscope was withdrawn.  The patient tolerated the procedure well.   FINAL DIAGNOSIS:  1. Normal colonoscopy and  terminal ileoscopy except small external     hemorrhoids.  2. Her symptoms were felt to be due to irritable bowel syndrome.  3. Normal esophagogastroduodenoscopy.  No evidence of Barrett's esophagus or     hiatal hernia.   RECOMMENDATIONS:  1. She will continue her antireflux therapy and __________  as before.  2. She will also continue sugar-free Citrucel half to one tablet every     morning and use Imodium 1-2 mg before lunch daily p.r.n.      ___________________________________________                                            Lionel December, M.D.   NR/MEDQ  D:  09/03/2003  T:  09/03/2003  Job:  [id number]   cc:   Colon Flattery, D.O.  [address]  [place]  [place] [address]  Fax: [phone]

## 2010-07-01 NOTE — Op Note (Signed)
[place]. [facility]  Patient:    [name], [name] Visit Number: [id number] MRN: [id number]          Service Type: DSU Location: [facility] Attending Physician:  Twana First Dictated by:   Elana Alm Thurston Hole, M.D. Proc. Date: 06/18/01 Admit Date:  06/18/2001                             Operative Report  PREOPERATIVE DIAGNOSIS: Right knee lateral meniscus tear and lateral meniscal cyst.  POSTOPERATIVE DIAGNOSIS:  1. Right knee medial and lateral meniscal tears.  2. Right knee chondromalacia.  3. Right knee lateral meniscal cyst.  OPERATION/PROCEDURE:  1. Right knee examination under anesthesia followed by arthroscopic partial     medial and lateral meniscectomy.  2. Right knee chondroplasty.  3. Right knee open lateral meniscal cyst excision.  SURGEON: Elana Alm. Thurston Hole, M.D.  ASSISTANT: Julien Girt, P.A.  ANESTHESIA: General.  OPERATIVE TIME: Forty-five minutes.  COMPLICATIONS: None.  INDICATIONS FOR PROCEDURE: [name] is a 70 year old who has had significant right knee pain for the past year, increasing in nature, with signs and symptoms and MRI documenting lateral meniscal tear and meniscal cyst, who has failed conservative care and is now to undergo arthroscopy and meniscal cystic excision.  DESCRIPTION: [name] was brought to the operating room on Jun 18, 2001, placed on the operative table in the supine position.  After an adequate level of general anesthesia was obtained her right knee was examined under anesthesia.  She had full range of motion.  Her knee was stable ligamentous examination with normal patella tracking.  There was a small palpable mass noted on the lateral joint line consistent with the lateral meniscal cyst. The right knee was sterilely injected with 0.25% Marcaine with epinephrine. The right leg was then prepped using sterile Betadine and draped using sterile technique.  Originally through an inferolateral  portal the arthroscope with a pump attached was placed and through an inferior medial portal an arthroscopic probe was placed.  On initial inspection of the medial compartment the articular cartilage, medial femoral condyle, medial tibial plateau showed 25% grade III chondromalacia, which was debrided.  Medial meniscus showed small tear of the posterior medial corner of which 25% was resected back to a stable rim.  The intercondylar notch was inspected and anterior and posterior cruciate ligaments were normal.  Lateral compartment inspected.  She had 30-40% grade III chondromalacia which was debrided on the lateral femoral condyle, lateral tibial plateau.  She had some complex tearing of the posterior and lateral horn of the lateral meniscus, of which 30-40% was resected back to a stable rim.  The cyst could not be well identified from the intra-articular portion on visualization.  The popliteus tendon was inspected. This was found to be normal.  The patellofemoral joint was inspected.  She had 25-30% grade III chondromalacia on the patella which was debrided.  Femoral groove showed grade I and II chondromalacia.  The patella tracked normally. Moderate synovitis in the medial and lateral gutters were debrided; otherwise, they were free of pathology.  After this was done the arthroscopic instruments were removed and then a 3 cm lateral incision was made over the lateral joint line.  The underlying subcutaneous tissues were incised in line with the skin incision.  The iliotibial band was incised longitudinally revealing the underlying cyst, which was multiloculated and was excised.  It was very thin-walled and filled with  a gelatinous material and was not sent for pathologic review due to the obvious benign nature of it.  After this was done then the joint capsule over this was closed with interrupted 2-0 Vicryl suture and then the iliotibial band closed with 2-0 Vicryl and subcutaneous  tissues closed with 2-0 Vicryl, subcuticular layer closed with 3-0 Prolene, Steri-Strips were applied.  Sterile dressings were applied after the knee was injected with 0.25% Marcaine with epinephrine and 4 mg of morphine.  The patient was then awakened and taken to the recovery room in stable condition. She had received Ancef 1 g IV preoperatively for prophylaxis.  FOLLOW-UP CARE: Ms. Hurd will be followed as an outpatient, on Vicodin and Bextra.  See her back in the office in a week for sutures out and follow-up. Dictated by:   Elana Alm Thurston Hole, M.D. Attending Physician:  Twana First DD:  06/18/01 TD:  06/19/01 Job: [id number]

## 2010-07-01 NOTE — Op Note (Signed)
[name], [name]                         ACCOUNT NO.:  [id number]   MEDICAL RECORD NO.:  [id number]                   PATIENT TYPE:  OIB   LOCATION:  5740                                 FACILITY:  [facility]   PHYSICIAN:  Maisie Fus B. Samuella Cota, M.D.               DATE OF BIRTH:  [birth year]   DATE OF PROCEDURE:  09/11/2001  DATE OF DISCHARGE:  09/12/2001                                 OPERATIVE REPORT   PREOPERATIVE DIAGNOSIS:  Chronic cholecystitis with cholelithiasis.   POSTOPERATIVE DIAGNOSIS:  Chronic cholecystitis with cholelithiasis.   OPERATION:  Laparoscopic cholecystectomy with operative cholangiogram.   SURGEON:  Maisie Fus B. Samuella Cota, M.D.   ASSISTANT:  Thornton Park. Daphine Deutscher, M.D.   ANESTHESIA:  General.   ANESTHESIOLOGIST:  Maren Beach, M.D. and CRNA.   DESCRIPTION OF PROCEDURE:  The patient was taken to the operating room,  placed on the table in the supine position.  After satisfactory general  anesthetic with intubation the entire abdomen was prepped and draped as a  sterile field.  A small vertical infraumbilical incision was made through  the skin and subcutaneous tissue and the midline fascia.  A finger was  placed in the peritoneal cavity and there were no adhesions noted to the  anterior abdominal wall.  A pursestring suture of 0 Vicryl was placed and  the Hasson trocar placed into the abdomen, and the abdomin insufflated to 14  mmHg pressure.  A second 10 mm trocar was placed just to the right of the  midline in the subxiphoid area.  Two 5 mm trocars were placed laterally.  The gallbladder was visualized.  There were adhesions extending up about two-  thirds of the way of the gallbladder.  The adhesions were taken down with  gentle dissection.  The patient had a prominent common duct which was easily  visible.  The cystic duct was somewhat short.  The patient had a small  vessel which was probably a lymphatic coming across superior to the cystic  duct and this  was clipped on each side and divided.  The cystic duct was  dissected free and a clip placed on the gallbladder side.  A Cook  cholangiocath was introduced through a separate stab wound in the right  upper quadrant of the abdomen.  A small opening was made in the cystic duct  and the catheter was easily placed in the cystic duct and held with an  Endoclip.  Using Real-Time CR fluoroscopy a cholangiogram was carried out.  This revealed good filling of the entire biliary system with free spillage  of contrast into the duodenum.  There was no evidence of any stones.  Following this, the cystic duct was triple clipped on the remaining side and  divided.  Dissection then revealed a cystic artery which was superior to the  cystic duct.  The cystic artery was triple clipped on the remaining side,  once on the gallbladder side, and divided.  The gallbladder was dissected  from the gallbladder bed using the cautery.  A small opening was made into  the gallbladder with some leakage of bile but no stones leaked out.  After  the gallbladder had been freed up it was placed in the EndoCatch bag and  easily delivered through the infraumbilical incision.  By palpation the  patient had one large stone - perhaps a centimeter in size.  The gallbladder  bed was irrigated and there was no evidence of bleeding.  The pursestring  suture at the infraumbilical incision was tied to give a good closure.  The  two lateral trocars were removed under direct vision after the fluid in the  right upper quadrant had been suctioned.  The abdomen was deflated.  The  second 10 mm trocar was removed.  Marcaine 0.25% without epinephrine was  injected at the infraumbilical incision at the end of the procedure and it  had previously been injected at the other trocar sites.  The two midline  incisions were closed with running subcuticular sutures of 4-0 Vicryl and a  single subcuticular 4-0 Vicryl was placed at the two lateral  trocar sites.  Benzoin and half-inch Steri-Strips were used to reinforce all skin closures,  and one strip was also placed across the stab wound made by the  cholangiocath. Dry sterile dressings were applied.  The patient seemed to  tolerate the procedure well and was taken to the PACU in satisfactory  condition.                                               Thomas B. Samuella Cota, M.D.    TBP/MEDQ  D:  09/11/2001  T:  09/16/2001  Job:  [id number]   cc:   Colon Flattery

## 2010-07-01 NOTE — H&P (Signed)
NAMEMARCHELLE, [name] NO.:  [id number]   MEDICAL RECORD NO.:  [id number]          PATIENT TYPE:  INP   LOCATION:  3016                         FACILITY:  [facility]   PHYSICIAN:  Stefani Dama, M.D.  DATE OF BIRTH:  1940/09/09   DATE OF ADMISSION:  03/20/2005  DATE OF DISCHARGE:                                HISTORY & PHYSICAL   ADMITTING DIAGNOSES:  Lumbar spondylosis, herniated nucleus pulposus T12-L1  with myelopathy, lumbar radiculopathy.   HISTORY OF PRESENT ILLNESS:  [name] is a 70 year old right-handed  individual who I have taken care of for cervical spondylitic disease.  I saw  her on March 10, 2005, but with complaints of bilateral lower extremity  weakness, back pain, and spasms which has been becoming progressively more  severe.  She finds that she has been requiring increasingly small steps to  walk and she has decreased stamina in her legs.  This has been a process  that has been coming on for several years.  She was diagnosed with diabetes  several years ago and she is managed by Dr. Joette Catching.  She is on oral  hypoglycemics and her last  HBA1c has been 6.  An MRI in November of this  year demonstrated she has evidence of profound sclerotic changes at T12-L1  with chronic disk herniation with calcification and effacement of the nerve  roots and the conus medullaris.  She has been having increasing frequency of  bladder and bowel incontinence and on occasion has had some difficulties  with this.  Furthermore, she notes that there is an associated decrease in  sensation in her distal lower extremities and her peroneal area.   PAST MEDICAL HISTORY:  1.  She is diabetic.  2.  She has some history of PAT.  3.  Fibromyalgia.   CURRENT MEDICATIONS:  Meloxicam, Zyrtec, Skelaxin, Avandia, Nexium,  Singulair, vitamin supplement.  She has also been on verapamil, potassium  supplement, aspirin, Lanoxin, Toprol, fluoxetine, TriCor, Zetia,  Requip.  Carisoprodol and Ambien have been used to help with sleep.  She is also  taking some fish oil supplements and calcium supplementation.   She is allergic to DEMEROL which causes vomiting.   SOCIAL HISTORY:  She does not smoke.  She does not drink alcohol.  Her  height/weight have been stable, 5 feet 5 inches, 190 pounds.   REVIEW OF SYSTEMS:  Notable for balance disturbance and sinus problems,  irregular pulse, swelling in her feet and hands, leg pain while walking,  swelling in lower extremities, weakness, back pain, incontinence are also  noted as in history of present illness.   PHYSICAL EXAMINATION:  NEUROLOGIC:  She is alert, oriented, and cooperative  individual.  She is moderately anxious.  She stands straight and erect  without any difficulty and she flexes forwards through 90 degrees.  She  extends 10 degrees.  She side bends only 15 degrees.  Her motor strength is  good in the iliopsoas, quadriceps, tibialis anterior, and the gastrocnemii.  Reflexes are 2+ in the patella and 3+ in the Achilles' with some modest  hyperreflexia and a hint of clonus.  Babinski's are equivocal bilaterally.  Sensation is intact to vibration in the distal lower extremities.  Palpation  and percussion in her back does not reproduce any localized pain or  tenderness.  Cranial nerve examination reveals the pupils are 3 mm, briskly  reactive to light and accommodation.  The extraocular movements are full.  Face is symmetric to grimace.  Tongue and uvula are in the midline.  Sclerae  and conjunctivae are clear.  Neck reveals no masses.  No bruits are heard.  Upper extremity strength and reflexes are within the limits of normal.  LUNGS:  Clear to auscultation.  HEART:  Regular rate and rhythm.  ABDOMEN:  Soft.  Bowel sounds positive.  No masses are palpable.  EXTREMITIES:  No clubbing, cyanosis, edema.   IMPRESSION:  The patient has evidence of spondylitic disease in the lower  lumbar spine.   She is now being admitted to undergo surgical decompression  at the T12-L1 level where there is a large disk herniation with conus  compression.  She will be mobilized early and we will institute some  diabetic control measures.      Stefani Dama, M.D.  Electronically Signed     HJE/MEDQ  D:  03/21/2005  T:  03/21/2005  Job:  [id number]

## 2010-07-01 NOTE — Discharge Summary (Signed)
NAMEZORI, BENBROOK NO.:  [id number]   MEDICAL RECORD NO.:  [id number]          PATIENT TYPE:  INP   LOCATION:  3016                         FACILITY:  [facility]   PHYSICIAN:  Stefani Dama, M.D.  DATE OF BIRTH:  [birth year]   DATE OF ADMISSION:  03/20/2005  DATE OF DISCHARGE:  03/22/2005                                 DISCHARGE SUMMARY   ADMISSION DIAGNOSIS:  Herniated nucleus pulposus with spondylosis, T12-L1,  with lumbar myelopathy.   POSTOPERATIVE DIAGNOSIS:  Herniated nucleus pulposus with spondylosis, T12-  L1, with lumbar myelopathy.   PROCEDURE:  Bilateral diskectomy, T12-L1, posterior interbody arthrodesis  with cage technique, segmental fixation T12-L1, posterolateral arthrodesis  with local autograft and allograft.   CONDITION ON DISCHARGE:  Improving.   HOSPITAL COURSE:  [name] is a 70 year old individual who has had  significant back and bilateral lower extremity pain with weakness.  She has  evidence of spondylitic myelopathy at the T12-L1 level.  She was advised  regarding surgical decompression, and this was undertaken on February 5.  Postoperatively she has done quite well.  She was able to be weaned off of  narcotic pain medications in the first 24 hours.  She tolerated oral pain  medication, and the last 24 hours she is walking independently.  Her bowel  have not moved; however, she is feeling well, tolerating a diet.  She is  passing her water without difficulty.  Neurologically she appears intact and  walks independently.  Her incision is clean and dry.  She is given a  prescription for Percocet #60 without refills, Valium 5 mg #40 without  refills.  She will be seen in the office in two weeks' time.   CONDITION ON DISCHARGE:  Improved.   DISCHARGE AND FINAL DIAGNOSIS:  Herniated nucleus pulposus with spondylosis,  T12-L1, with lumbar myelopathy.      Stefani Dama, M.D.  Electronically Signed     HJE/MEDQ  D:   03/22/2005  T:  03/23/2005  Job:  [id number]

## 2010-09-21 ENCOUNTER — Other Ambulatory Visit: Payer: Self-pay | Admitting: Family Medicine

## 2010-09-21 DIAGNOSIS — Z1231 Encounter for screening mammogram for malignant neoplasm of breast: Secondary | ICD-10-CM

## 2010-10-03 ENCOUNTER — Ambulatory Visit
Admission: RE | Admit: 2010-10-03 | Discharge: 2010-10-03 | Disposition: A | Discharge status: Home / Self Care | Payer: Medicare Other | Source: Ambulatory Visit | Attending: Family Medicine | Admitting: Family Medicine

## 2010-10-03 DIAGNOSIS — Z1231 Encounter for screening mammogram for malignant neoplasm of breast: Secondary | ICD-10-CM

## 2010-11-23 ENCOUNTER — Emergency Department (HOSPITAL_BASED_OUTPATIENT_CLINIC_OR_DEPARTMENT_OTHER)
Admission: EM | Admit: 2010-11-23 | Discharge: 2010-11-23 | Disposition: A | Discharge status: Home / Self Care | Payer: Medicare Other | Attending: Emergency Medicine | Admitting: Emergency Medicine

## 2010-11-23 ENCOUNTER — Encounter: Payer: Self-pay | Admitting: *Deleted

## 2010-11-23 DIAGNOSIS — R197 Diarrhea, unspecified: Secondary | ICD-10-CM

## 2010-11-23 DIAGNOSIS — I10 Essential (primary) hypertension: Secondary | ICD-10-CM | POA: Insufficient documentation

## 2010-11-23 DIAGNOSIS — F341 Dysthymic disorder: Secondary | ICD-10-CM | POA: Insufficient documentation

## 2010-11-23 DIAGNOSIS — R112 Nausea with vomiting, unspecified: Secondary | ICD-10-CM

## 2010-11-23 DIAGNOSIS — IMO0001 Reserved for inherently not codable concepts without codable children: Secondary | ICD-10-CM | POA: Insufficient documentation

## 2010-11-23 DIAGNOSIS — E785 Hyperlipidemia, unspecified: Secondary | ICD-10-CM | POA: Insufficient documentation

## 2010-11-23 HISTORY — DX: Supraventricular tachycardia: I47.1

## 2010-11-23 HISTORY — DX: Restless legs syndrome: G25.81

## 2010-11-23 HISTORY — DX: Hyperlipidemia, unspecified: E78.5

## 2010-11-23 HISTORY — DX: Fibromyalgia: M79.7

## 2010-11-23 HISTORY — DX: Major depressive disorder, single episode, unspecified: F32.9

## 2010-11-23 HISTORY — DX: Depression, unspecified: F32.A

## 2010-11-23 HISTORY — DX: Gastro-esophageal reflux disease without esophagitis: K21.9

## 2010-11-23 HISTORY — DX: Reserved for inherently not codable concepts without codable children: IMO0001

## 2010-11-23 HISTORY — DX: Anxiety disorder, unspecified: F41.9

## 2010-11-23 HISTORY — DX: Other supraventricular tachycardia: I47.19

## 2010-11-23 LAB — CBC
HCT: 45.7 % (ref 36.0–46.0)
Hemoglobin: 15.9 g/dL — ABNORMAL HIGH (ref 12.0–15.0)
MCH: 29.5 pg (ref 26.0–34.0)
MCHC: 34.8 g/dL (ref 30.0–36.0)
MCV: 84.8 fL (ref 78.0–100.0)
Platelets: 312 10*3/uL (ref 150–400)
RBC: 5.39 MIL/uL — ABNORMAL HIGH (ref 3.87–5.11)
RDW: 12.9 % (ref 11.5–15.5)
WBC: 10.8 10*3/uL — ABNORMAL HIGH (ref 4.0–10.5)

## 2010-11-23 LAB — COMPREHENSIVE METABOLIC PANEL
ALT: 31 U/L (ref 0–35)
AST: 28 U/L (ref 0–37)
Albumin: 4.2 g/dL (ref 3.5–5.2)
Alkaline Phosphatase: 65 U/L (ref 39–117)
BUN: 24 mg/dL — ABNORMAL HIGH (ref 6–23)
CO2: 22 mEq/L (ref 19–32)
Calcium: 10.3 mg/dL (ref 8.4–10.5)
Chloride: 101 mEq/L (ref 96–112)
Creatinine, Ser: 0.8 mg/dL (ref 0.50–1.10)
GFR calc Af Amer: 86 mL/min — ABNORMAL LOW (ref >90)
GFR calc non Af Amer: 74 mL/min — ABNORMAL LOW (ref >90)
Glucose, Bld: 200 mg/dL — ABNORMAL HIGH (ref 70–99)
Potassium: 4.1 mEq/L (ref 3.5–5.1)
Sodium: 136 mEq/L (ref 135–145)
Total Bilirubin: 0.7 mg/dL (ref 0.3–1.2)
Total Protein: 7.3 g/dL (ref 6.0–8.3)

## 2010-11-23 LAB — URINE MICROSCOPIC-ADD ON

## 2010-11-23 LAB — URINALYSIS, ROUTINE W REFLEX MICROSCOPIC
Bilirubin Urine: NEGATIVE
Glucose, UA: NEGATIVE mg/dL
Hgb urine dipstick: NEGATIVE
Ketones, ur: NEGATIVE mg/dL
Leukocytes, UA: NEGATIVE
Nitrite: NEGATIVE
Protein, ur: 30 mg/dL — AB
Specific Gravity, Urine: 1.028 (ref 1.005–1.030)
Urobilinogen, UA: 0.2 mg/dL (ref 0.0–1.0)
pH: 6 (ref 5.0–8.0)

## 2010-11-23 MED ORDER — PANTOPRAZOLE SODIUM 40 MG IV SOLR
40.0000 mg | Freq: Once | INTRAVENOUS | Status: AC
  Administered 2010-11-23: 40 mg via INTRAVENOUS
  Filled 2010-11-23: qty 40

## 2010-11-23 MED ORDER — SODIUM CHLORIDE 0.9 % IV BOLUS (SEPSIS)
1000.0000 mL | Freq: Once | INTRAVENOUS | Status: AC
  Administered 2010-11-23: 1000 mL via INTRAVENOUS

## 2010-11-23 MED ORDER — ONDANSETRON HCL 4 MG/2ML IJ SOLN
4.0000 mg | Freq: Once | INTRAMUSCULAR | Status: AC
  Administered 2010-11-23: 4 mg via INTRAVENOUS
  Filled 2010-11-23: qty 2

## 2010-11-23 MED ORDER — ONDANSETRON HCL 8 MG PO TABS: ORAL_TABLET | ORAL | Status: AC

## 2010-11-23 MED ORDER — LORAZEPAM 2 MG/ML IJ SOLN
0.5000 mg | Freq: Once | INTRAMUSCULAR | Status: AC
  Administered 2010-11-23: 11:00:00 via INTRAVENOUS
  Filled 2010-11-23: qty 1

## 2010-11-23 MED ORDER — SODIUM CHLORIDE 0.9 % IV BOLUS (SEPSIS)
500.0000 mL | Freq: Once | INTRAVENOUS | Status: AC
  Administered 2010-11-23: 1000 mL via INTRAVENOUS

## 2010-11-23 MED ORDER — MORPHINE SULFATE 4 MG/ML IJ SOLN
4.0000 mg | Freq: Once | INTRAMUSCULAR | Status: AC
  Administered 2010-11-23: 4 mg via INTRAVENOUS
  Filled 2010-11-23: qty 1

## 2010-11-23 NOTE — ED Provider Notes (Signed)
History     CSN: [id number] Arrival date & time: 11/23/2010  8:33 AM  Chief Complaint  Patient presents with  . Emesis    Nausea and diarrhea x 12 hours    (Consider location/radiation/quality/duration/timing/severity/associated sxs/prior treatment) Patient is a 70 y.o. female presenting with vomiting. The history is provided by the patient.  Emesis  This is a new problem. There has been no fever. Associated symptoms include diarrhea. Pertinent negatives include no fever and no headaches.  pt c/o nvd since last pm. Several episodes of each. Emesis clear, not bloody or bilious. Diarrhea watery, not bloody. No exacerbating or alleviating factors. Mid abd crampy pain, comes and goes, non radiating. No constant or focal pain. No gu c/o. Prior abd surgery includes hysterectomy and cholecystectomy. No fever or chills. Notes 3-4 other episodes similar symptoms within past year, no specific dx. No recent ill contacts, travel or antibiotic use.   Past Medical History  Diagnosis Date  . Diabetes mellitus   . PAT (paroxysmal atrial tachycardia)   . Hypertension   . Reflux   . Depression   . Anxiety   . Restless leg syndrome   . Hyperlipemia   . Hypertriglyceridemia   . Fibromyalgia     Past Surgical History  Procedure Date  . Cholecystectomy   . Depression   . Anxiety   . Back surgery     T12-L1 fusion, Anterior Cervical fusion  . Tonsillectomy   . Fracture surgery   . Knee surgery     x 3  . Excision morton's neuroma   . Abdominal hysterectomy     No family history on file.  History  Substance Use Topics  . Smoking status: Never Smoker   . Smokeless tobacco: Not on file  . Alcohol Use: No    OB History    Grav Para Term Preterm Abortions TAB SAB Ect Mult Living                  Review of Systems  Constitutional: Negative for fever.  HENT: Negative for neck pain.   Eyes: Negative for redness.  Respiratory: Negative for shortness of breath.   Cardiovascular:  Negative for chest pain.  Gastrointestinal: Positive for vomiting and diarrhea.  Genitourinary: Negative for dysuria.  Skin: Negative for rash.  Neurological: Negative for headaches.  Hematological: Does not bruise/bleed easily.  Psychiatric/Behavioral: Negative for behavioral problems.    Allergies  Chocolate; Demerol; and Lisinopril  Home Medications   Current Outpatient Rx  Name Route Sig Dispense Refill  . ALPRAZOLAM 0.25 MG PO TABS Oral Take 0.25 mg by mouth at bedtime as needed.     . ASPIRIN 81 MG PO TABS Oral Take 81 mg by mouth daily.      Marland Kitchen CALCIUM CARBONATE-VITAMIN D 500-200 MG-UNIT PO TABS Oral Take 2 tablets by mouth 2 (two) times daily.      Marland Kitchen CETIRIZINE HCL 10 MG PO TABS Oral Take 10 mg by mouth 2 (two) times daily.      Marland Kitchen VITAMIN D 1000 UNITS PO TABS Oral Take 50,000 Units by mouth 2 (two) times a week.     . CHOLINE FENOFIBRATE 135 MG PO CPDR Oral Take 135 mg by mouth daily.     Marland Kitchen CLOPIDOGREL BISULFATE 75 MG PO TABS Oral Take 75 mg by mouth daily.     Marland Kitchen DIGOXIN 0.25 MG PO TABS Oral Take 250 mcg by mouth daily.     . DULOXETINE HCL 60 MG PO CPEP  Oral Take 60 mg by mouth daily. As needed for fibromyalgia     . EZETIMIBE 10 MG PO TABS Oral Take 10 mg by mouth daily.     . OMEGA-3 FATTY ACIDS 1000 MG PO CAPS Oral Take 2 g by mouth 2 (two) times daily.      Marland Kitchen GLIMEPIRIDE 2 MG PO TABS Oral Take 2 mg by mouth daily before breakfast.      . INSULIN GLARGINE 100 UNIT/ML [place] SOLN Subcutaneous Inject into the skin at bedtime.      Marland Kitchen LIRAGLUTIDE 18 MG/3ML [place] SOLN Subcutaneous Inject 1.8 mg into the skin daily.     Marland Kitchen METFORMIN HCL 1000 MG PO TABS Oral Take 500 mg by mouth 2 (two) times daily with a meal.     . METOPROLOL SUCCINATE 25 MG PO TB24 Oral Take 25 mg by mouth daily.     Marland Kitchen PRESERVISION AREDS 2 PO CAPS Oral Take 2 capsules by mouth 2 (two) times daily.      Marland Kitchen OMEPRAZOLE 40 MG PO CPDR Oral Take 40 mg by mouth daily.     Marland Kitchen PRAVASTATIN SODIUM 40 MG PO TABS Oral Take 40 mg  by mouth daily.     Marland Kitchen RAMIPRIL 10 MG PO CAPS Oral Take 10 mg by mouth daily.     Marland Kitchen VERAPAMIL HCL ER 240 MG PO CP24 Oral Take 240 mg by mouth at bedtime.     . CYCLOBENZAPRINE HCL 5 MG PO TABS Oral Take 5 mg by mouth 3 (three) times daily as needed. As need for fibromialgia    . DIAZEPAM 5 MG PO TABS Oral Take 5 mg by mouth every 6 (six) hours as needed. As needed for axiety    . NITROGLYCERIN 0.4 MG SL SUBL Sublingual Place 0.4 mg under the tongue every 5 (five) minutes as needed. As needed chest pain    . ONDANSETRON HCL 8 MG PO TABS  One (1) po q 6 hours prn nausea 12 tablet 0    BP 158/86  Pulse 93  Temp(Src) 97.7 F (36.5 C) (Oral)  Resp 20  Ht 5\' 3"  (1.6 m)  Wt 194 lb (87.998 kg)  BMI 34.37 kg/m2  SpO2 96%  Physical Exam  Nursing note and vitals reviewed. Constitutional: She is oriented to person, place, and time. She appears well-developed and well-nourished. No distress.  HENT:  Head: Atraumatic.  Eyes: Conjunctivae are normal. No scleral icterus.  Neck: Neck supple. No tracheal deviation present.  Cardiovascular: Normal rate and normal heart sounds.   Pulmonary/Chest: Effort normal and breath sounds normal. No respiratory distress.  Abdominal: Soft. Normal appearance and bowel sounds are normal. She exhibits no distension and no mass. There is no tenderness. There is no rebound and no guarding.  Genitourinary:       No cva tenderness  Musculoskeletal: She exhibits no edema.  Neurological: She is alert and oriented to person, place, and time.  Skin: Skin is warm and dry. No rash noted.  Psychiatric: She has a normal mood and affect.    ED Course  Procedures (including critical care time)  Labs Reviewed  CBC - Abnormal; Notable for the following:    WBC 10.8 (*)    RBC 5.39 (*)    Hemoglobin 15.9 (*)    All other components within normal limits  COMPREHENSIVE METABOLIC PANEL - Abnormal; Notable for the following:    Glucose, Bld 200 (*)    BUN 24 (*)    GFR calc  non Af Amer 74 (*)    GFR calc Af Amer 86 (*)    All other components within normal limits  URINALYSIS, ROUTINE W REFLEX MICROSCOPIC - Abnormal; Notable for the following:    Protein, ur 30 (*)    All other components within normal limits  URINE MICROSCOPIC-ADD ON - Abnormal; Notable for the following:    Casts HYALINE CASTS (*) GRANULAR CAST   All other components within normal limits     1. Nausea vomiting and diarrhea       MDM  IV ns bolus. zofran iv. Labs.    Additional ivf. abd soft nt. Pt takes xanax for anxiety. Ativan .5 iv. protonix iv, morphine iv.   Pt notes resolution of symptoms, no nvd in ed. abd soft nt.      Suzi Roots, MD 11/23/10 1101

## 2010-11-23 NOTE — ED Notes (Signed)
Patient states she has had nausea, vomiting and diarrhea for the last 12 hours.  States she had same symptoms 3 days ago which resolved and she felt better.  States this is the 4th time this year she had theses symptoms.

## 2010-11-28 LAB — POCT CARDIAC MARKERS
CKMB, poc: <1 — ABNORMAL LOW
CKMB, poc: <1 — ABNORMAL LOW
Myoglobin, poc: 36.2
Myoglobin, poc: 43.9
Operator id: 4295
Operator id: 4661
Troponin i, poc: <0.05
Troponin i, poc: <0.05

## 2010-11-28 LAB — DIFFERENTIAL
Basophils Absolute: 0
Basophils Relative: 0
Eosinophils Absolute: 0.1
Eosinophils Relative: 2
Lymphocytes Relative: 37
Lymphs Abs: 2.6
Monocytes Absolute: 0.5
Monocytes Relative: 7
Neutro Abs: 3.8
Neutrophils Relative %: 54

## 2010-11-28 LAB — CBC
HCT: 39.6
Hemoglobin: 13.9
MCHC: 35.1
MCV: 85.5
Platelets: 255
RBC: 4.63
RDW: 13.6
WBC: 7.1

## 2010-11-28 LAB — BASIC METABOLIC PANEL
BUN: 17
CO2: 23
Calcium: 10
Chloride: 106
Creatinine, Ser: 0.81
GFR calc Af Amer: >60
GFR calc non Af Amer: >60
Glucose, Bld: 127 — ABNORMAL HIGH
Potassium: 4
Sodium: 139

## 2010-11-28 LAB — DIGOXIN LEVEL: Digoxin Level: 0.6 — ABNORMAL LOW

## 2010-11-28 LAB — D-DIMER, QUANTITATIVE (NOT AT ARMC): D-Dimer, Quant: 0.94 — ABNORMAL HIGH

## 2010-12-21 ENCOUNTER — Encounter (HOSPITAL_COMMUNITY)
Admission: RE | Disposition: A | Discharge status: Home / Self Care | Payer: Self-pay | Source: Ambulatory Visit | Attending: Gastroenterology

## 2010-12-21 ENCOUNTER — Ambulatory Visit (HOSPITAL_COMMUNITY)
Admission: RE | Admit: 2010-12-21 | Discharge: 2010-12-21 | Disposition: A | Discharge status: Home / Self Care | Payer: Medicare Other | Source: Ambulatory Visit | Attending: Gastroenterology | Admitting: Gastroenterology

## 2010-12-21 ENCOUNTER — Other Ambulatory Visit: Payer: Self-pay | Admitting: Gastroenterology

## 2010-12-21 ENCOUNTER — Encounter (HOSPITAL_COMMUNITY): Payer: Self-pay | Admitting: Anesthesiology

## 2010-12-21 ENCOUNTER — Encounter (HOSPITAL_COMMUNITY): Payer: Self-pay | Admitting: *Deleted

## 2010-12-21 ENCOUNTER — Ambulatory Visit (HOSPITAL_COMMUNITY): Payer: Medicare Other | Admitting: Anesthesiology

## 2010-12-21 DIAGNOSIS — K644 Residual hemorrhoidal skin tags: Secondary | ICD-10-CM | POA: Insufficient documentation

## 2010-12-21 DIAGNOSIS — K921 Melena: Secondary | ICD-10-CM | POA: Insufficient documentation

## 2010-12-21 DIAGNOSIS — E785 Hyperlipidemia, unspecified: Secondary | ICD-10-CM | POA: Insufficient documentation

## 2010-12-21 DIAGNOSIS — E119 Type 2 diabetes mellitus without complications: Secondary | ICD-10-CM | POA: Insufficient documentation

## 2010-12-21 DIAGNOSIS — K5730 Diverticulosis of large intestine without perforation or abscess without bleeding: Secondary | ICD-10-CM | POA: Insufficient documentation

## 2010-12-21 DIAGNOSIS — R1310 Dysphagia, unspecified: Secondary | ICD-10-CM | POA: Insufficient documentation

## 2010-12-21 DIAGNOSIS — IMO0001 Reserved for inherently not codable concepts without codable children: Secondary | ICD-10-CM | POA: Insufficient documentation

## 2010-12-21 DIAGNOSIS — K648 Other hemorrhoids: Secondary | ICD-10-CM | POA: Insufficient documentation

## 2010-12-21 DIAGNOSIS — K219 Gastro-esophageal reflux disease without esophagitis: Secondary | ICD-10-CM | POA: Insufficient documentation

## 2010-12-21 DIAGNOSIS — R112 Nausea with vomiting, unspecified: Secondary | ICD-10-CM | POA: Insufficient documentation

## 2010-12-21 DIAGNOSIS — Z79899 Other long term (current) drug therapy: Secondary | ICD-10-CM | POA: Insufficient documentation

## 2010-12-21 DIAGNOSIS — K449 Diaphragmatic hernia without obstruction or gangrene: Secondary | ICD-10-CM | POA: Insufficient documentation

## 2010-12-21 DIAGNOSIS — K2970 Gastritis, unspecified, without bleeding: Secondary | ICD-10-CM | POA: Insufficient documentation

## 2010-12-21 HISTORY — PX: ESOPHAGOGASTRODUODENOSCOPY: SHX5428

## 2010-12-21 LAB — GLUCOSE, CAPILLARY: Glucose-Capillary: 199 mg/dL — ABNORMAL HIGH (ref 70–99)

## 2010-12-21 SURGERY — EGD (ESOPHAGOGASTRODUODENOSCOPY)
Anesthesia: Monitor Anesthesia Care

## 2010-12-21 MED ORDER — OMEPRAZOLE 40 MG PO CPDR: DELAYED_RELEASE_CAPSULE | ORAL | Status: DC

## 2010-12-21 MED ORDER — MIDAZOLAM HCL 5 MG/5ML IJ SOLN
INTRAMUSCULAR | Status: DC | PRN
  Administered 2010-12-21: 2 mg via INTRAVENOUS

## 2010-12-21 MED ORDER — PROPOFOL 10 MG/ML IV EMUL
INTRAVENOUS | Status: DC | PRN
  Administered 2010-12-21: 70 ug/kg/min via INTRAVENOUS

## 2010-12-21 MED ORDER — KETAMINE HCL 10 MG/ML IJ SOLN
INTRAMUSCULAR | Status: DC | PRN
  Administered 2010-12-21: 20 mg via INTRAVENOUS

## 2010-12-21 MED ORDER — STERILE WATER FOR IRRIGATION IR SOLN
Status: DC | PRN
  Administered 2010-12-21: 10:00:00

## 2010-12-21 MED ORDER — SODIUM CHLORIDE 0.9 % IV SOLN
Freq: Once | INTRAVENOUS | Status: AC
  Administered 2010-12-21: 20 mL via INTRAVENOUS
  Administered 2010-12-21: 09:00:00 via INTRAVENOUS

## 2010-12-21 MED ORDER — BUTAMBEN-TETRACAINE-BENZOCAINE 2-2-14 % EX AERO
INHALATION_SPRAY | CUTANEOUS | Status: DC | PRN
  Administered 2010-12-21: 2 via TOPICAL

## 2010-12-21 MED ORDER — FENTANYL CITRATE 0.05 MG/ML IJ SOLN
INTRAMUSCULAR | Status: DC | PRN
  Administered 2010-12-21: 100 ug via INTRAVENOUS

## 2010-12-21 NOTE — Anesthesia Preprocedure Evaluation (Addendum)
Anesthesia Evaluation  Patient identified by MRN, date of birth, ID band Patient awake    Reviewed: Allergy & Precautions, H&P , NPO status , Patient's Chart, lab work & pertinent test results, reviewed documented beta blocker date and time   Airway Mallampati: III TM Distance: >3 FB Neck ROM: Full    Dental No notable dental hx. (+) Teeth Intact   Pulmonary neg pulmonary ROS,  clear to auscultation  Pulmonary exam normal       Cardiovascular hypertension, neg cardio ROS Regular Normal H/o PAT on dig plus b-blocker    Neuro/Psych Negative Neurological ROS  Negative Psych ROS   GI/Hepatic negative GI ROS, Neg liver ROS,   Endo/Other  Negative Endocrine ROSDiabetes mellitus-  Renal/GU negative Renal ROS  Genitourinary negative   Musculoskeletal negative musculoskeletal ROS (+) Fibromyalgia -  Abdominal   Peds negative pediatric ROS (+)  Hematology negative hematology ROS (+)   Anesthesia Other Findings   Reproductive/Obstetrics negative OB ROS                           Anesthesia Physical Anesthesia Plan  ASA: III  Anesthesia Plan: General and MAC   Post-op Pain Management:    Induction: Intravenous  Airway Management Planned:   Additional Equipment:   Intra-op Plan:   Post-operative Plan:   Informed Consent: I have reviewed the patients History and Physical, chart, labs and discussed the procedure including the risks, benefits and alternatives for the proposed anesthesia with the patient or authorized representative who has indicated his/her understanding and acceptance.   Dental advisory given  Plan Discussed with: CRNA  Anesthesia Plan Comments:         Anesthesia Quick Evaluation Pt took Metoprolol 12-21-10 at [facility]

## 2010-12-21 NOTE — Preoperative (Signed)
Beta Blockers   Reason not to administer Beta Blockers:Not Applicable 

## 2010-12-21 NOTE — Transfer of Care (Signed)
Immediate Anesthesia Transfer of Care Note  Patient: [name]  Procedure(s) Performed:  ESOPHAGOGASTRODUODENOSCOPY (EGD); BALLOON DILATION; COLONOSCOPY WITH PROPOFOL  Patient Location: ENDO PACU  Anesthesia Type: MAC  Level of Consciousness: awake, alert , oriented and patient cooperative  Airway & Oxygen Therapy: Patient Spontanous Breathing and Patient connected to nasal cannula oxygen  Post-op Assessment: Report given to PACU RN  Post vital signs: Reviewed  Complications: No apparent anesthesia complications

## 2010-12-21 NOTE — Anesthesia Postprocedure Evaluation (Signed)
  Anesthesia Post-op Note  Patient: [name]  Procedure(s) Performed:  ESOPHAGOGASTRODUODENOSCOPY (EGD); BALLOON DILATION; COLONOSCOPY WITH PROPOFOL  Patient Location: PACU  Anesthesia Type: MAC  Level of Consciousness: awake and alert   Airway and Oxygen Therapy: Patient Spontanous Breathing  Post-op Pain: mild  Post-op Assessment: Post-op Vital signs reviewed, Patient's Cardiovascular Status Stable, Respiratory Function Stable, Patent Airway and No signs of Nausea or vomiting  Post-op Vital Signs: stable  Complications: No apparent anesthesia complications

## 2010-12-21 NOTE — Brief Op Note (Signed)
Please see [facility] note dated 12/21/2010              

## 2010-12-21 NOTE — H&P (Signed)
Patient interval history reviewed.  Patient examined again.  There has been no change from documented H/P dated 12/09/10 (scanned into chart from our office) except as documented above.

## 2010-12-23 ENCOUNTER — Emergency Department (HOSPITAL_BASED_OUTPATIENT_CLINIC_OR_DEPARTMENT_OTHER)
Admission: EM | Admit: 2010-12-23 | Discharge: 2010-12-23 | Disposition: A | Discharge status: Home / Self Care | Payer: Medicare Other | Attending: Emergency Medicine | Admitting: Emergency Medicine

## 2010-12-23 ENCOUNTER — Encounter (HOSPITAL_BASED_OUTPATIENT_CLINIC_OR_DEPARTMENT_OTHER): Payer: Self-pay

## 2010-12-23 ENCOUNTER — Emergency Department (INDEPENDENT_AMBULATORY_CARE_PROVIDER_SITE_OTHER): Payer: Medicare Other

## 2010-12-23 DIAGNOSIS — R109 Unspecified abdominal pain: Secondary | ICD-10-CM

## 2010-12-23 DIAGNOSIS — K2970 Gastritis, unspecified, without bleeding: Secondary | ICD-10-CM | POA: Insufficient documentation

## 2010-12-23 DIAGNOSIS — R110 Nausea: Secondary | ICD-10-CM

## 2010-12-23 DIAGNOSIS — R112 Nausea with vomiting, unspecified: Secondary | ICD-10-CM | POA: Insufficient documentation

## 2010-12-23 DIAGNOSIS — I10 Essential (primary) hypertension: Secondary | ICD-10-CM | POA: Insufficient documentation

## 2010-12-23 DIAGNOSIS — E119 Type 2 diabetes mellitus without complications: Secondary | ICD-10-CM | POA: Insufficient documentation

## 2010-12-23 DIAGNOSIS — F341 Dysthymic disorder: Secondary | ICD-10-CM | POA: Insufficient documentation

## 2010-12-23 DIAGNOSIS — K2990 Gastroduodenitis, unspecified, without bleeding: Secondary | ICD-10-CM | POA: Insufficient documentation

## 2010-12-23 DIAGNOSIS — IMO0001 Reserved for inherently not codable concepts without codable children: Secondary | ICD-10-CM | POA: Insufficient documentation

## 2010-12-23 LAB — COMPREHENSIVE METABOLIC PANEL
ALT: 35 U/L (ref 0–35)
AST: 28 U/L (ref 0–37)
Albumin: 4 g/dL (ref 3.5–5.2)
Alkaline Phosphatase: 65 U/L (ref 39–117)
BUN: 13 mg/dL (ref 6–23)
CO2: 22 mEq/L (ref 19–32)
Calcium: 9.8 mg/dL (ref 8.4–10.5)
Chloride: 103 mEq/L (ref 96–112)
Creatinine, Ser: 0.6 mg/dL (ref 0.50–1.10)
GFR calc Af Amer: >90 mL/min (ref >90)
GFR calc non Af Amer: >90 mL/min (ref >90)
Glucose, Bld: 211 mg/dL — ABNORMAL HIGH (ref 70–99)
Potassium: 4.2 mEq/L (ref 3.5–5.1)
Sodium: 136 mEq/L (ref 135–145)
Total Bilirubin: 0.4 mg/dL (ref 0.3–1.2)
Total Protein: 6.8 g/dL (ref 6.0–8.3)

## 2010-12-23 LAB — URINE MICROSCOPIC-ADD ON

## 2010-12-23 LAB — DIFFERENTIAL
Basophils Absolute: 0 10*3/uL (ref 0.0–0.1)
Basophils Relative: 0 % (ref 0–1)
Eosinophils Absolute: 0.2 10*3/uL (ref 0.0–0.7)
Eosinophils Relative: 1 % (ref 0–5)
Lymphocytes Relative: 21 % (ref 12–46)
Lymphs Abs: 2.5 10*3/uL (ref 0.7–4.0)
Monocytes Absolute: 0.5 10*3/uL (ref 0.1–1.0)
Monocytes Relative: 4 % (ref 3–12)
Neutro Abs: 8.8 10*3/uL — ABNORMAL HIGH (ref 1.7–7.7)
Neutrophils Relative %: 74 % (ref 43–77)

## 2010-12-23 LAB — URINALYSIS, ROUTINE W REFLEX MICROSCOPIC
Bilirubin Urine: NEGATIVE
Glucose, UA: NEGATIVE mg/dL
Hgb urine dipstick: NEGATIVE
Ketones, ur: NEGATIVE mg/dL
Nitrite: NEGATIVE
Protein, ur: NEGATIVE mg/dL
Specific Gravity, Urine: 1.016 (ref 1.005–1.030)
Urobilinogen, UA: 0.2 mg/dL (ref 0.0–1.0)
pH: 5.5 (ref 5.0–8.0)

## 2010-12-23 LAB — CBC
HCT: 40.5 % (ref 36.0–46.0)
Hemoglobin: 13.6 g/dL (ref 12.0–15.0)
MCH: 29.3 pg (ref 26.0–34.0)
MCHC: 33.6 g/dL (ref 30.0–36.0)
MCV: 87.3 fL (ref 78.0–100.0)
Platelets: 273 10*3/uL (ref 150–400)
RBC: 4.64 MIL/uL (ref 3.87–5.11)
RDW: 12.8 % (ref 11.5–15.5)
WBC: 12 10*3/uL — ABNORMAL HIGH (ref 4.0–10.5)

## 2010-12-23 LAB — LIPASE, BLOOD: Lipase: 38 U/L (ref 11–59)

## 2010-12-23 MED ORDER — MORPHINE SULFATE 4 MG/ML IJ SOLN
4.0000 mg | Freq: Once | INTRAMUSCULAR | Status: AC
  Administered 2010-12-23: 4 mg via INTRAVENOUS
  Filled 2010-12-23: qty 1

## 2010-12-23 MED ORDER — SODIUM CHLORIDE 0.9 % IV BOLUS (SEPSIS)
1000.0000 mL | Freq: Once | INTRAVENOUS | Status: AC
  Administered 2010-12-23: 1000 mL via INTRAVENOUS

## 2010-12-23 MED ORDER — SODIUM CHLORIDE 0.9 % IV SOLN: 999.0000 mL | INTRAVENOUS | Status: DC

## 2010-12-23 MED ORDER — ONDANSETRON HCL 4 MG/2ML IJ SOLN
4.0000 mg | Freq: Once | INTRAMUSCULAR | Status: AC
  Administered 2010-12-23: 4 mg via INTRAVENOUS
  Filled 2010-12-23: qty 2

## 2010-12-23 MED ORDER — ONDANSETRON 8 MG PO TBDP: 8.0000 mg | ORAL_TABLET | Freq: Three times a day (TID) | ORAL | Status: AC | PRN

## 2010-12-23 NOTE — ED Provider Notes (Addendum)
History     CSN: [id number] Arrival date & time: 12/23/2010  7:57 AM   First MD Initiated Contact with Patient 12/23/10 0754      No chief complaint on file.   (Consider location/radiation/quality/duration/timing/severity/associated sxs/prior treatment) HPI Patient states she's having nausea and vomiting that started this morning. Patient states she vomited up about 1000 cc of fluid. The emesis is clear without blood or bilious color. Patient states she has had a burning upper abdominal pain associated with this. The pain does not radiate. She has not had any diarrhea or fever with this. The patient has had prior abdominal surgeries including hysterectomy and cholecystectomy. She recently saw her GI doctor and had an endoscopy and colonoscopy. Patient brought the results for today and that showed inflammation/gastritis. Patient states she had a biopsy done as well to test for helicobacter pylori.  Patient states the symptoms are severe. She's tired of having these recurrent episodes that have been ongoing over the last year requiring several ED visits. Patient denies any chest pain or shortness of breath   Past Medical History  Diagnosis Date  . Diabetes mellitus   . PAT (paroxysmal atrial tachycardia)   . Hypertension   . Reflux   . Depression   . Anxiety   . Restless leg syndrome   . Hyperlipemia   . Hypertriglyceridemia   . Fibromyalgia     Past Surgical History  Procedure Date  . Cholecystectomy   . Depression   . Anxiety   . Back surgery     T12-L1 fusion, Anterior Cervical fusion  . Tonsillectomy   . Fracture surgery   . Knee surgery     x 3  . Excision morton's neuroma   . Abdominal hysterectomy     No family history on file.  History  Substance Use Topics  . Smoking status: Never Smoker   . Smokeless tobacco: Not on file  . Alcohol Use: No    OB History    Grav Para Term Preterm Abortions TAB SAB Ect Mult Living                  Review of Systems    All other systems reviewed and are negative.    Allergies  Chocolate; Demerol; and Lisinopril  Home Medications   Current Outpatient Rx  Name Route Sig Dispense Refill  . ALPRAZOLAM 0.25 MG PO TABS Oral Take 0.25 mg by mouth at bedtime as needed.     . ASPIRIN 81 MG PO TABS Oral Take 81 mg by mouth daily.      Marland Kitchen CALCIUM CARBONATE-VITAMIN D 500-200 MG-UNIT PO TABS Oral Take 2 tablets by mouth 2 (two) times daily.      Marland Kitchen CETIRIZINE HCL 10 MG PO TABS Oral Take 10 mg by mouth 2 (two) times daily.      Marland Kitchen VITAMIN D 1000 UNITS PO TABS Oral Take 50,000 Units by mouth 2 (two) times a week.     . CHOLINE FENOFIBRATE 135 MG PO CPDR Oral Take 135 mg by mouth daily.     Marland Kitchen CLOPIDOGREL BISULFATE 75 MG PO TABS Oral Take 75 mg by mouth daily.     . CYCLOBENZAPRINE HCL 5 MG PO TABS Oral Take 5 mg by mouth 3 (three) times daily as needed. As need for fibromialgia    . DIAZEPAM 5 MG PO TABS Oral Take 5 mg by mouth every 6 (six) hours as needed. As needed for axiety    . DIGOXIN 0.25  MG PO TABS Oral Take 250 mcg by mouth daily.     . DULOXETINE HCL 60 MG PO CPEP Oral Take 60 mg by mouth daily. As needed for fibromyalgia     . EXENATIDE 10 MCG/0.04ML [place] SOLN Subcutaneous Inject 10 mcg into the skin 2 (two) times daily with a meal.      . EZETIMIBE 10 MG PO TABS Oral Take 10 mg by mouth daily.     . OMEGA-3 FATTY ACIDS 1000 MG PO CAPS Oral Take 2 g by mouth 2 (two) times daily.      Marland Kitchen GLIMEPIRIDE 2 MG PO TABS Oral Take 2 mg by mouth daily before breakfast.      . INSULIN GLARGINE 100 UNIT/ML [place] SOLN Subcutaneous Inject into the skin at bedtime.      Marland Kitchen LIRAGLUTIDE 18 MG/3ML [place] SOLN Subcutaneous Inject 1.8 mg into the skin daily.     Marland Kitchen METFORMIN HCL 1000 MG PO TABS Oral Take 500 mg by mouth 2 (two) times daily with a meal.     . METOPROLOL SUCCINATE 25 MG PO TB24 Oral Take 25 mg by mouth daily.     Marland Kitchen PRESERVISION AREDS 2 PO CAPS Oral Take 2 capsules by mouth 2 (two) times daily.      Marland Kitchen NITROGLYCERIN 0.4 MG  SL SUBL Sublingual Place 0.4 mg under the tongue every 5 (five) minutes as needed. As needed chest pain    . OMEPRAZOLE 40 MG PO CPDR  Take one tablet thirty minutes before breakfast, and another tablet thirty minutes before dinner. 60 capsule 2  . ONDANSETRON HCL 4 MG PO TABS Oral Take 4 mg by mouth every 8 (eight) hours as needed.      Marland Kitchen PRAVASTATIN SODIUM 40 MG PO TABS Oral Take 40 mg by mouth daily.     Marland Kitchen RAMIPRIL 10 MG PO CAPS Oral Take 10 mg by mouth daily.     Marland Kitchen ROPINIROLE HCL 1 MG PO TABS Oral Take 1 mg by mouth 1 day or 1 dose.      Marland Kitchen VERAPAMIL HCL ER 240 MG PO CP24 Oral Take 240 mg by mouth at bedtime.       There were no vitals taken for this visit.  Physical Exam  Nursing note and vitals reviewed. Constitutional: She appears distressed.       Overweight  HENT:  Head: Normocephalic and atraumatic.  Right Ear: External ear normal.  Left Ear: External ear normal.  Eyes: Conjunctivae are normal. Right eye exhibits no discharge. Left eye exhibits no discharge. No scleral icterus.  Neck: Neck supple. No tracheal deviation present.  Cardiovascular: Normal rate, regular rhythm and intact distal pulses.   Pulmonary/Chest: Effort normal and breath sounds normal. No stridor. No respiratory distress. She has no wheezes. She has no rales.  Abdominal: Soft. Bowel sounds are normal. She exhibits no distension. There is tenderness. There is no rebound and no guarding.       Mild tenderness palpation epigastric  Musculoskeletal: She exhibits no edema and no tenderness.  Neurological: She is alert. She has normal strength. No sensory deficit. Cranial nerve deficit:  no gross defecits noted. She exhibits normal muscle tone. She displays no seizure activity. Coordination normal.  Skin: Skin is warm and dry. No rash noted.  Psychiatric: She has a normal mood and affect.    ED Course  Procedures (including critical care time)  Labs Reviewed  CBC - Abnormal; Notable for the following:    WBC  12.0 (*)    All other components within normal limits  DIFFERENTIAL - Abnormal; Notable for the following:    Neutro Abs 8.8 (*)    All other components within normal limits  COMPREHENSIVE METABOLIC PANEL - Abnormal; Notable for the following:    Glucose, Bld 211 (*)    All other components within normal limits  URINALYSIS, ROUTINE W REFLEX MICROSCOPIC - Abnormal; Notable for the following:    Leukocytes, UA SMALL (*)    All other components within normal limits  URINE MICROSCOPIC-ADD ON - Abnormal; Notable for the following:    Bacteria, UA FEW (*)    All other components within normal limits  LIPASE, BLOOD   Dg Abd Acute W/chest  12/23/2010  *RADIOLOGY REPORT*  Clinical Data: Abdominal pain.  Nausea and vomiting.  Upper endoscopy and colonoscopy 2 days ago.  ACUTE ABDOMEN SERIES (ABDOMEN 2 VIEW & CHEST 1 VIEW) 12/23/2010:  Comparison: Two-view abdomen x-ray 04/17/2010 [facility]. Portable chest x-ray 03/14/2009 [facility].  Findings: Bowel gas pattern unremarkable without evidence of obstruction or significant ileus.  No evidence of free air on the erect image.  Scattered air-fluid levels in the colon consistent with liquid stool.  Phleboliths in the pelvis.  No visible opaque urinary tract calculi.  Prior L1-2 fusion.  Surgical clips in the right upper quadrant from prior cholecystectomy.  Degenerative changes throughout the remainder of the lumbar spine.  Cardiomediastinal silhouette unremarkable.  Lungs clear.  No pleural effusions.  IMPRESSION: No acute abdominal or pulmonary abnormality.  Original Report Authenticated By: Arnell Sieving, M.D.    Medications  insulin glargine (LANTUS) 100 UNIT/ML injection (not administered)  0.9 %  sodium chloride infusion (0 mL Intravenous Stopped 12/23/10 0815)  omeprazole (PRILOSEC) 40 MG capsule (not administered)  ondansetron (ZOFRAN ODT) 8 MG disintegrating tablet (not administered)  ondansetron (ZOFRAN) injection 4 mg (4  mg Intravenous Given 12/23/10 0828)  morphine 4 MG/ML injection 4 mg (4 mg Intravenous Given 12/23/10 0828)  sodium chloride 0.9 % bolus 1,000 mL (1000 mL Intravenous Given 12/23/10 0820)      MDM  Patient is feeling better after treatment. She has a history of recurrent episodes of this. Patient has recently had an endoscopy that was confirmed gastritis. I suspect this is a re\re exacerbation of the same. There does not appear to be any evidence of an acute emergency condition at this time. I doubt cardiac etiology. There is no evidence to suggest a bowel obstruction. Known to suggest pancreatitis or biliary disease. Patient will be discharged home to continue her outpatient medications.  Diagnosis: #1 gastritis #2 abdominal pain        Celene Kras, MD 12/23/10 1009  Celene Kras, MD 12/23/10 1016

## 2010-12-23 NOTE — ED Notes (Signed)
Pt vomited moderate amount of emesis.  Pt transported to radiology.

## 2010-12-23 NOTE — ED Notes (Signed)
Family at bedside. 

## 2010-12-27 ENCOUNTER — Other Ambulatory Visit (HOSPITAL_COMMUNITY): Payer: Self-pay | Admitting: Gastroenterology

## 2010-12-27 DIAGNOSIS — R112 Nausea with vomiting, unspecified: Secondary | ICD-10-CM

## 2011-01-06 ENCOUNTER — Encounter (HOSPITAL_COMMUNITY): Payer: Self-pay | Admitting: Gastroenterology

## 2011-01-16 ENCOUNTER — Encounter (HOSPITAL_COMMUNITY)
Admission: RE | Admit: 2011-01-16 | Discharge: 2011-01-16 | Disposition: A | Discharge status: Home / Self Care | Payer: Medicare Other | Source: Ambulatory Visit | Attending: Gastroenterology | Admitting: Gastroenterology

## 2011-01-16 DIAGNOSIS — R112 Nausea with vomiting, unspecified: Secondary | ICD-10-CM

## 2011-01-16 MED ORDER — TECHNETIUM TC 99M SULFUR COLLOID
2.0000 | Freq: Once | INTRAVENOUS | Status: AC | PRN
  Administered 2011-01-16: 2 via INTRAVENOUS

## 2011-05-23 ENCOUNTER — Other Ambulatory Visit: Payer: Self-pay | Admitting: Neurological Surgery

## 2011-05-23 DIAGNOSIS — M47812 Spondylosis without myelopathy or radiculopathy, cervical region: Secondary | ICD-10-CM

## 2011-05-29 ENCOUNTER — Ambulatory Visit
Admission: RE | Admit: 2011-05-29 | Discharge: 2011-05-29 | Disposition: A | Discharge status: Home / Self Care | Payer: Medicare Other | Source: Ambulatory Visit | Attending: Neurological Surgery | Admitting: Neurological Surgery

## 2011-05-29 DIAGNOSIS — M47812 Spondylosis without myelopathy or radiculopathy, cervical region: Secondary | ICD-10-CM

## 2011-09-08 ENCOUNTER — Other Ambulatory Visit: Payer: Self-pay | Admitting: Family Medicine

## 2011-09-08 DIAGNOSIS — Z1231 Encounter for screening mammogram for malignant neoplasm of breast: Secondary | ICD-10-CM

## 2011-10-04 ENCOUNTER — Ambulatory Visit
Admission: RE | Admit: 2011-10-04 | Discharge: 2011-10-04 | Disposition: A | Discharge status: Home / Self Care | Payer: Medicare Other | Source: Ambulatory Visit | Attending: Family Medicine | Admitting: Family Medicine

## 2011-10-04 DIAGNOSIS — Z1231 Encounter for screening mammogram for malignant neoplasm of breast: Secondary | ICD-10-CM

## 2012-04-30 ENCOUNTER — Ambulatory Visit: Payer: Medicare Other | Attending: Specialist | Admitting: Physical Therapy

## 2012-04-30 DIAGNOSIS — IMO0001 Reserved for inherently not codable concepts without codable children: Secondary | ICD-10-CM | POA: Insufficient documentation

## 2012-04-30 DIAGNOSIS — R5381 Other malaise: Secondary | ICD-10-CM | POA: Insufficient documentation

## 2012-04-30 DIAGNOSIS — M25519 Pain in unspecified shoulder: Secondary | ICD-10-CM | POA: Insufficient documentation

## 2012-05-02 ENCOUNTER — Ambulatory Visit: Payer: Medicare Other | Admitting: Physical Therapy

## 2012-05-07 ENCOUNTER — Emergency Department (HOSPITAL_BASED_OUTPATIENT_CLINIC_OR_DEPARTMENT_OTHER): Payer: Medicare Other

## 2012-05-07 ENCOUNTER — Inpatient Hospital Stay (HOSPITAL_BASED_OUTPATIENT_CLINIC_OR_DEPARTMENT_OTHER)
Admission: EM | Admit: 2012-05-07 | Discharge: 2012-05-08 | DRG: 392 | Disposition: A | Discharge status: Home / Self Care | Payer: Medicare Other | Attending: Internal Medicine | Admitting: Internal Medicine

## 2012-05-07 ENCOUNTER — Ambulatory Visit: Payer: Medicare Other | Admitting: Physical Therapy

## 2012-05-07 ENCOUNTER — Encounter (HOSPITAL_BASED_OUTPATIENT_CLINIC_OR_DEPARTMENT_OTHER): Payer: Self-pay | Admitting: Radiology

## 2012-05-07 DIAGNOSIS — K5289 Other specified noninfective gastroenteritis and colitis: Principal | ICD-10-CM | POA: Diagnosis present

## 2012-05-07 DIAGNOSIS — F32A Depression, unspecified: Secondary | ICD-10-CM | POA: Diagnosis present

## 2012-05-07 DIAGNOSIS — F3289 Other specified depressive episodes: Secondary | ICD-10-CM | POA: Diagnosis present

## 2012-05-07 DIAGNOSIS — I471 Supraventricular tachycardia, unspecified: Secondary | ICD-10-CM | POA: Diagnosis present

## 2012-05-07 DIAGNOSIS — R109 Unspecified abdominal pain: Secondary | ICD-10-CM

## 2012-05-07 DIAGNOSIS — K529 Noninfective gastroenteritis and colitis, unspecified: Secondary | ICD-10-CM | POA: Diagnosis present

## 2012-05-07 DIAGNOSIS — E1149 Type 2 diabetes mellitus with other diabetic neurological complication: Secondary | ICD-10-CM | POA: Diagnosis present

## 2012-05-07 DIAGNOSIS — E119 Type 2 diabetes mellitus without complications: Secondary | ICD-10-CM | POA: Diagnosis present

## 2012-05-07 DIAGNOSIS — E785 Hyperlipidemia, unspecified: Secondary | ICD-10-CM | POA: Diagnosis present

## 2012-05-07 DIAGNOSIS — I10 Essential (primary) hypertension: Secondary | ICD-10-CM | POA: Diagnosis present

## 2012-05-07 DIAGNOSIS — IMO0001 Reserved for inherently not codable concepts without codable children: Secondary | ICD-10-CM | POA: Diagnosis present

## 2012-05-07 DIAGNOSIS — M797 Fibromyalgia: Secondary | ICD-10-CM | POA: Diagnosis present

## 2012-05-07 DIAGNOSIS — F329 Major depressive disorder, single episode, unspecified: Secondary | ICD-10-CM | POA: Diagnosis present

## 2012-05-07 HISTORY — DX: Unspecified osteoarthritis, unspecified site: M19.90

## 2012-05-07 LAB — CBC WITH DIFFERENTIAL/PLATELET
Band Neutrophils: 2 % (ref 0–10)
Basophils Absolute: 0 10*3/uL (ref 0.0–0.1)
Basophils Relative: 0 % (ref 0–1)
Eosinophils Absolute: 0 10*3/uL (ref 0.0–0.7)
Eosinophils Relative: 0 % (ref 0–5)
HCT: 50.6 % — ABNORMAL HIGH (ref 36.0–46.0)
Hemoglobin: 17 g/dL — ABNORMAL HIGH (ref 12.0–15.0)
Lymphocytes Relative: 5 % — ABNORMAL LOW (ref 12–46)
Lymphs Abs: 1.3 10*3/uL (ref 0.7–4.0)
MCH: 29.5 pg (ref 26.0–34.0)
MCHC: 33.6 g/dL (ref 30.0–36.0)
MCV: 87.7 fL (ref 78.0–100.0)
Monocytes Absolute: 1.3 10*3/uL — ABNORMAL HIGH (ref 0.1–1.0)
Monocytes Relative: 5 % (ref 3–12)
Neutro Abs: 23.2 10*3/uL — ABNORMAL HIGH (ref 1.7–7.7)
Neutrophils Relative %: 88 % — ABNORMAL HIGH (ref 43–77)
Platelets: 331 10*3/uL (ref 150–400)
RBC: 5.77 MIL/uL — ABNORMAL HIGH (ref 3.87–5.11)
RDW: 14.2 % (ref 11.5–15.5)
WBC: 25.8 10*3/uL — ABNORMAL HIGH (ref 4.0–10.5)

## 2012-05-07 LAB — URINALYSIS, ROUTINE W REFLEX MICROSCOPIC
Bilirubin Urine: NEGATIVE
Glucose, UA: NEGATIVE mg/dL
Hgb urine dipstick: NEGATIVE
Ketones, ur: 15 mg/dL — AB
Leukocytes, UA: NEGATIVE
Nitrite: NEGATIVE
Protein, ur: 30 mg/dL — AB
Specific Gravity, Urine: 1.029 (ref 1.005–1.030)
Urobilinogen, UA: 0.2 mg/dL (ref 0.0–1.0)
pH: 5.5 (ref 5.0–8.0)

## 2012-05-07 LAB — TROPONIN I: Troponin I: <0.3 ng/mL (ref <0.30)

## 2012-05-07 LAB — BASIC METABOLIC PANEL
BUN: 30 mg/dL — ABNORMAL HIGH (ref 6–23)
CO2: 22 mEq/L (ref 19–32)
Calcium: 10.2 mg/dL (ref 8.4–10.5)
Chloride: 99 mEq/L (ref 96–112)
Creatinine, Ser: 0.9 mg/dL (ref 0.50–1.10)
GFR calc Af Amer: 74 mL/min — ABNORMAL LOW (ref >90)
GFR calc non Af Amer: 64 mL/min — ABNORMAL LOW (ref >90)
Glucose, Bld: 195 mg/dL — ABNORMAL HIGH (ref 70–99)
Potassium: 4.5 mEq/L (ref 3.5–5.1)
Sodium: 136 mEq/L (ref 135–145)

## 2012-05-07 LAB — LIPASE, BLOOD: Lipase: 127 U/L — ABNORMAL HIGH (ref 11–59)

## 2012-05-07 LAB — URINE MICROSCOPIC-ADD ON

## 2012-05-07 LAB — DIGOXIN LEVEL: Digoxin Level: 0.5 ng/mL — ABNORMAL LOW (ref 0.8–2.0)

## 2012-05-07 LAB — GLUCOSE, CAPILLARY: Glucose-Capillary: 117 mg/dL — ABNORMAL HIGH (ref 70–99)

## 2012-05-07 MED ORDER — METOPROLOL SUCCINATE ER 25 MG PO TB24
25.0000 mg | ORAL_TABLET | Freq: Every day | ORAL | Status: DC
  Administered 2012-05-08: 25 mg via ORAL
  Filled 2012-05-07: qty 1

## 2012-05-07 MED ORDER — INSULIN ASPART 100 UNIT/ML ~~LOC~~ SOLN: 0.0000 [IU] | Freq: Every day | SUBCUTANEOUS | Status: DC

## 2012-05-07 MED ORDER — OMEGA-3-ACID ETHYL ESTERS 1 G PO CAPS
2.0000 g | ORAL_CAPSULE | Freq: Two times a day (BID) | ORAL | Status: DC
  Administered 2012-05-07 – 2012-05-08 (×2): 2 g via ORAL
  Filled 2012-05-07 (×3): qty 2

## 2012-05-07 MED ORDER — SODIUM CHLORIDE 0.9 % IV SOLN: INTRAVENOUS | Status: DC

## 2012-05-07 MED ORDER — IOHEXOL 300 MG/ML  SOLN
100.0000 mL | Freq: Once | INTRAMUSCULAR | Status: AC | PRN
  Administered 2012-05-07: 100 mL via INTRAVENOUS

## 2012-05-07 MED ORDER — VITAMIN D 1000 UNITS PO TABS
50000.0000 [IU] | ORAL_TABLET | ORAL | Status: DC
  Filled 2012-05-07: qty 50

## 2012-05-07 MED ORDER — EZETIMIBE 10 MG PO TABS
10.0000 mg | ORAL_TABLET | Freq: Every day | ORAL | Status: DC
  Administered 2012-05-08: 10 mg via ORAL
  Filled 2012-05-07: qty 1

## 2012-05-07 MED ORDER — OMEGA-3 FATTY ACIDS 1000 MG PO CAPS: 2.0000 g | ORAL_CAPSULE | Freq: Two times a day (BID) | ORAL | Status: DC

## 2012-05-07 MED ORDER — ROPINIROLE HCL 1 MG PO TABS
1.0000 mg | ORAL_TABLET | Freq: Every day | ORAL | Status: DC
  Administered 2012-05-07: 1 mg via ORAL
  Filled 2012-05-07 (×2): qty 1

## 2012-05-07 MED ORDER — ENOXAPARIN SODIUM 40 MG/0.4ML ~~LOC~~ SOLN
40.0000 mg | SUBCUTANEOUS | Status: DC
  Administered 2012-05-07: 40 mg via SUBCUTANEOUS
  Filled 2012-05-07 (×2): qty 0.4

## 2012-05-07 MED ORDER — ONDANSETRON HCL 4 MG/2ML IJ SOLN: 4.0000 mg | Freq: Four times a day (QID) | INTRAMUSCULAR | Status: DC | PRN

## 2012-05-07 MED ORDER — INSULIN ASPART 100 UNIT/ML ~~LOC~~ SOLN
0.0000 [IU] | Freq: Three times a day (TID) | SUBCUTANEOUS | Status: DC
  Administered 2012-05-08: 13:00:00 via SUBCUTANEOUS

## 2012-05-07 MED ORDER — DULOXETINE HCL 60 MG PO CPEP
60.0000 mg | ORAL_CAPSULE | Freq: Every day | ORAL | Status: DC
  Administered 2012-05-08: 60 mg via ORAL
  Filled 2012-05-07: qty 1

## 2012-05-07 MED ORDER — CIPROFLOXACIN IN D5W 400 MG/200ML IV SOLN
400.0000 mg | Freq: Two times a day (BID) | INTRAVENOUS | Status: DC
  Administered 2012-05-07 – 2012-05-08 (×2): 400 mg via INTRAVENOUS
  Filled 2012-05-07 (×3): qty 200

## 2012-05-07 MED ORDER — DIGOXIN 250 MCG PO TABS
250.0000 ug | ORAL_TABLET | Freq: Every day | ORAL | Status: DC
  Administered 2012-05-08: 250 ug via ORAL
  Filled 2012-05-07: qty 1

## 2012-05-07 MED ORDER — CLOPIDOGREL BISULFATE 75 MG PO TABS
75.0000 mg | ORAL_TABLET | Freq: Every day | ORAL | Status: DC
  Filled 2012-05-07: qty 1

## 2012-05-07 MED ORDER — ASPIRIN 81 MG PO CHEW
81.0000 mg | CHEWABLE_TABLET | Freq: Every day | ORAL | Status: DC
  Administered 2012-05-08: 81 mg via ORAL
  Filled 2012-05-07: qty 1

## 2012-05-07 MED ORDER — VERAPAMIL HCL ER 240 MG PO TBCR
240.0000 mg | EXTENDED_RELEASE_TABLET | Freq: Every day | ORAL | Status: DC
  Administered 2012-05-08: 240 mg via ORAL
  Filled 2012-05-07: qty 1

## 2012-05-07 MED ORDER — SIMVASTATIN 20 MG PO TABS
20.0000 mg | ORAL_TABLET | Freq: Every day | ORAL | Status: DC
  Filled 2012-05-07: qty 1

## 2012-05-07 MED ORDER — SODIUM CHLORIDE 0.9 % IV SOLN
INTRAVENOUS | Status: DC
  Administered 2012-05-07 – 2012-05-08 (×2): via INTRAVENOUS

## 2012-05-07 MED ORDER — HYDROMORPHONE HCL PF 1 MG/ML IJ SOLN
0.5000 mg | Freq: Once | INTRAMUSCULAR | Status: AC
  Administered 2012-05-07: 0.5 mg via INTRAVENOUS
  Filled 2012-05-07: qty 1

## 2012-05-07 MED ORDER — IOHEXOL 300 MG/ML  SOLN
50.0000 mL | Freq: Once | INTRAMUSCULAR | Status: AC | PRN
  Administered 2012-05-07: 50 mL via ORAL

## 2012-05-07 MED ORDER — GI COCKTAIL ~~LOC~~
30.0000 mL | Freq: Once | ORAL | Status: AC
  Administered 2012-05-07: 30 mL via ORAL
  Filled 2012-05-07: qty 30

## 2012-05-07 MED ORDER — METRONIDAZOLE IN NACL 5-0.79 MG/ML-% IV SOLN
500.0000 mg | Freq: Three times a day (TID) | INTRAVENOUS | Status: DC
  Administered 2012-05-07 – 2012-05-08 (×3): 500 mg via INTRAVENOUS
  Filled 2012-05-07 (×5): qty 100

## 2012-05-07 MED ORDER — ONDANSETRON HCL 4 MG/2ML IJ SOLN
4.0000 mg | Freq: Once | INTRAMUSCULAR | Status: AC
  Administered 2012-05-07: 4 mg via INTRAVENOUS
  Filled 2012-05-07: qty 2

## 2012-05-07 MED ORDER — CALCIUM CARBONATE-VITAMIN D 500-200 MG-UNIT PO TABS
2.0000 | ORAL_TABLET | Freq: Two times a day (BID) | ORAL | Status: DC
  Administered 2012-05-07 – 2012-05-08 (×2): 2 via ORAL
  Filled 2012-05-07 (×3): qty 2

## 2012-05-07 MED ORDER — RAMIPRIL 10 MG PO CAPS
10.0000 mg | ORAL_CAPSULE | Freq: Every day | ORAL | Status: DC
  Administered 2012-05-08: 10 mg via ORAL
  Filled 2012-05-07: qty 1

## 2012-05-07 MED ORDER — VERAPAMIL HCL ER 240 MG PO CP24: 240.0000 mg | ORAL_CAPSULE | Freq: Every day | ORAL | Status: DC

## 2012-05-07 MED ORDER — PANTOPRAZOLE SODIUM 40 MG PO TBEC
40.0000 mg | DELAYED_RELEASE_TABLET | Freq: Every day | ORAL | Status: DC
  Administered 2012-05-08: 40 mg via ORAL
  Filled 2012-05-07: qty 1

## 2012-05-07 MED ORDER — HYDROCODONE-ACETAMINOPHEN 5-325 MG PO TABS
1.0000 | ORAL_TABLET | ORAL | Status: DC | PRN
  Administered 2012-05-07: 1 via ORAL
  Filled 2012-05-07: qty 2

## 2012-05-07 MED ORDER — LORATADINE 10 MG PO TABS
10.0000 mg | ORAL_TABLET | Freq: Every day | ORAL | Status: DC
  Administered 2012-05-08: 10 mg via ORAL
  Filled 2012-05-07: qty 1

## 2012-05-07 MED ORDER — ONDANSETRON HCL 4 MG PO TABS: 4.0000 mg | ORAL_TABLET | Freq: Four times a day (QID) | ORAL | Status: DC | PRN

## 2012-05-07 MED ORDER — NITROGLYCERIN 0.4 MG SL SUBL: 0.4000 mg | SUBLINGUAL_TABLET | SUBLINGUAL | Status: DC | PRN

## 2012-05-07 MED ORDER — ASPIRIN 81 MG PO TABS: 81.0000 mg | ORAL_TABLET | Freq: Every day | ORAL | Status: DC

## 2012-05-07 NOTE — ED Provider Notes (Cosign Needed)
Medical screening examination/treatment/procedure(s) were conducted as a shared visit with non-physician practitioner(s) and myself.  I personally evaluated the patient during the encounter  Patient's blood work and EKG noted. Patient with persistent epigastric pain. We'll check troponin although her symptoms are consistent with ACS. Her abdomen is nonsurgical at this time. Have spoken with [facility] hospitalist and patient will be admitted  Toy Baker, MD 05/07/12 [phone]

## 2012-05-07 NOTE — ED Provider Notes (Signed)
History     CSN: [id number]  Arrival date & time 05/07/12  1433   First MD Initiated Contact with Patient 05/07/12 1447      Chief Complaint  Patient presents with  . Nausea  . Emesis  . Diarrhea    (Consider location/radiation/quality/duration/timing/severity/associated sxs/prior treatment) HPI Comments: Patient is a 72 y/o F with PMHx DM, HTN, HLD, PAT c/o abdominal pain, nausea, emesis, and diarrhea x 1 day. Patient reported that symptoms started this morning (05/07/2012) at approximately 2:00-3:00am. Patient reported that she did not feel like herself yesterday (05/06/2012). Patient reported abdominal pain as being generalized that is a constant, sharp pain. Patient reported no blood in emesis and diarrhea. Patient reported emesis to be mainly of stomach contents and diarrhea to be loose-watery consistency. Patient reported that Sunday night family went out to eat for dinner; daughter, grandson, and herself ate chicken that night and all individuals that has the chicken had similar symptoms. Patient reported difficulty breathing. Denied fever, sore throat, eye pain, ear pain, chest pain, shortness of breathe.   Patient is a 72 y.o. female presenting with abdominal pain. The history is provided by the patient. No language interpreter was used.  Abdominal Pain Pain location:  Generalized Pain quality: burning   Pain radiation: radiates all over abdomen.  Pain severity:  Severe Onset quality:  Sudden Duration:  1 day Timing:  Constant Progression:  Unchanged Chronicity:  New Context: suspicious food intake   Context: not recent illness   Relieved by:  None tried Worsened by:  Nothing tried Ineffective treatments:  None tried Associated symptoms: diarrhea, nausea and vomiting   Associated symptoms: no chest pain, no fever and no shortness of breath   Diarrhea:    Quality:  Watery   Severity:  Moderate   Duration:  1 day   Timing:  Intermittent   Progression:   Unchanged Vomiting:    Quality:  Stomach contents   Severity:  Moderate   Duration:  1 day   Timing:  Intermittent   Progression:  Unchanged Risk factors: being elderly     Past Medical History  Diagnosis Date  . Diabetes mellitus   . PAT (paroxysmal atrial tachycardia)   . Hypertension   . Reflux   . Depression   . Anxiety   . Restless leg syndrome   . Hyperlipemia   . Hypertriglyceridemia   . Fibromyalgia     Past Surgical History  Procedure Laterality Date  . Cholecystectomy    . Depression    . Anxiety    . Back surgery      T12-L1 fusion, Anterior Cervical fusion  . Tonsillectomy    . Fracture surgery    . Knee surgery      x 3  . Excision morton's neuroma    . Abdominal hysterectomy    . Upper gastrointestinal endoscopy    . Colonoscopy    . Esophagogastroduodenoscopy  12/21/2010    Procedure: ESOPHAGOGASTRODUODENOSCOPY (EGD);  Surgeon: Freddy Jaksch, MD;  Location: [facility];  Service: Endoscopy;  Laterality: N/A;    No family history on file.  History  Substance Use Topics  . Smoking status: Never Smoker   . Smokeless tobacco: Not on file  . Alcohol Use: No    OB History   Grav Para Term Preterm Abortions TAB SAB Ect Mult Living                  Review of Systems  Constitutional: Negative for fever.  HENT: Negative for ear pain and neck stiffness.   Respiratory: Negative for chest tightness and shortness of breath.   Cardiovascular: Negative for chest pain.  Gastrointestinal: Positive for nausea, vomiting, abdominal pain and diarrhea.  10 Systems reviewed and are negative for acute change except as noted in the HPI.   Allergies  Amoxil; Chocolate; Demerol; and Lisinopril  Home Medications   Current Outpatient Rx  Name  Route  Sig  Dispense  Refill  . Exenatide (BYDUREON [place])   Subcutaneous   Inject into the skin.         Marland Kitchen aspirin 81 MG tablet   Oral   Take 81 mg by mouth daily.          . calcium-vitamin D (OSCAL WITH  D) 500-200 MG-UNIT per tablet   Oral   Take 2 tablets by mouth 2 (two) times daily.          . cetirizine (ZYRTEC) 10 MG tablet   Oral   Take 10 mg by mouth 2 (two) times daily.          . cholecalciferol (VITAMIN D) 1000 UNITS tablet   Oral   Take 50,000 Units by mouth 2 (two) times a week. Last taken on Tuesday 12-20-10         . Choline Fenofibrate (TRILIPIX) 135 MG capsule   Oral   Take 135 mg by mouth daily.          . clopidogrel (PLAVIX) 75 MG tablet   Oral   Take 75 mg by mouth daily.          . digoxin (LANOXIN) 0.25 MG tablet   Oral   Take 250 mcg by mouth daily.          . DULoxetine (CYMBALTA) 60 MG capsule   Oral   Take 60 mg by mouth daily. As needed for fibromyalgia          . exenatide (BYETTA) 10 MCG/0.04ML SOLN   Subcutaneous   Inject 10 mcg into the skin 2 (two) times daily with a meal.          . ezetimibe (ZETIA) 10 MG tablet   Oral   Take 10 mg by mouth daily.          . fish oil-omega-3 fatty acids 1000 MG capsule   Oral   Take 2 g by mouth 2 (two) times daily.          . insulin glargine (LANTUS) 100 UNIT/ML injection   Subcutaneous   Inject 66 Units into the skin at bedtime.          . metFORMIN (GLUCOPHAGE) 1000 MG tablet   Oral   Take 1,000 mg by mouth 2 (two) times daily with a meal.          . metoprolol succinate (TOPROL-XL) 25 MG 24 hr tablet   Oral   Take 25 mg by mouth daily.          . nitroGLYCERIN (NITROSTAT) 0.4 MG SL tablet   Sublingual   Place 0.4 mg under the tongue every 5 (five) minutes as needed. As needed chest pain         . omeprazole (PRILOSEC) 40 MG capsule   Oral   Take 40 mg by mouth 2 (two) times daily. Take one tablet thirty minutes before breakfast, and another tablet thirty minutes before dinner.          . ondansetron (ZOFRAN) 4 MG tablet  Oral   Take 4 mg by mouth every 8 (eight) hours as needed.          . pravastatin (PRAVACHOL) 40 MG tablet   Oral   Take 40 mg  by mouth daily.          . ramipril (ALTACE) 10 MG capsule   Oral   Take 10 mg by mouth daily.          Marland Kitchen rOPINIRole (REQUIP) 1 MG tablet   Oral   Take 1 mg by mouth at bedtime.          . verapamil (VERELAN PM) 240 MG 24 hr capsule   Oral   Take 240 mg by mouth daily.            BP 156/87  Pulse 113  Temp(Src) 97.3 F (36.3 C)  Resp 18  SpO2 100%  Physical Exam  Nursing note and vitals reviewed. Constitutional: She is oriented to person, place, and time. She appears well-developed and well-nourished. No distress.  HENT:  Head: Normocephalic.  Right Ear: External ear normal.  Left Ear: External ear normal.  Nose: Nose normal.  Mouth/Throat: Oropharynx is clear and moist. No oropharyngeal exudate.  Possible hairy leukoplakia - soft, black dots on tongue.  Eyes: Conjunctivae and EOM are normal. Pupils are equal, round, and reactive to light. Right eye exhibits no discharge. Left eye exhibits no discharge.  Neck: Normal range of motion. Neck supple.  Cardiovascular: Normal rate, regular rhythm, normal heart sounds and intact distal pulses.  Exam reveals no friction rub.   No murmur heard. Pulmonary/Chest: Effort normal and breath sounds normal. No respiratory distress. She has no wheezes. She has no rales.  Abdominal: Soft. Bowel sounds are normal. She exhibits no distension. There is tenderness. There is no rebound and no guarding.  Tenderness upon palpation to all 4 quadrants.  Lymphadenopathy:    She has no cervical adenopathy.  Neurological: She is alert and oriented to person, place, and time.  Skin: Skin is warm and dry. No rash noted. She is not diaphoretic. No erythema.  Psychiatric: She has a normal mood and affect. Her behavior is normal.    ED Course  Procedures (including critical care time)  Labs Reviewed  CBC WITH DIFFERENTIAL - Abnormal; Notable for the following:    WBC 25.8 (*)    RBC 5.77 (*)    Hemoglobin 17.0 (*)    HCT 50.6 (*)     Neutrophils Relative 88 (*)    Lymphocytes Relative 5 (*)    Neutro Abs 23.2 (*)    Monocytes Absolute 1.3 (*)    All other components within normal limits  BASIC METABOLIC PANEL - Abnormal; Notable for the following:    Glucose, Bld 195 (*)    BUN 30 (*)    GFR calc non Af Amer 64 (*)    GFR calc Af Amer 74 (*)    All other components within normal limits  LIPASE, BLOOD - Abnormal; Notable for the following:    Lipase 127 (*)    All other components within normal limits  URINALYSIS, ROUTINE W REFLEX MICROSCOPIC - Abnormal; Notable for the following:    Color, Urine AMBER (*)    APPearance CLOUDY (*)    Ketones, ur 15 (*)    Protein, ur 30 (*)    All other components within normal limits  URINE MICROSCOPIC-ADD ON - Abnormal; Notable for the following:    Squamous Epithelial / LPF FEW (*)  All other components within normal limits  DIGOXIN LEVEL - Abnormal; Notable for the following:    Digoxin Level 0.5 (*)    All other components within normal limits  TROPONIN I    Date: 05/07/2012  Rate: 105  Rhythm: sinus tachycardia  QRS Axis: right  Intervals: normal  ST/T Wave abnormalities: nonspecific ST/T changes - mild ST segment depressions - nothing to compare EKG to  Conduction Disutrbances:none  Narrative Interpretation:   Old EKG Reviewed: none available PQRS: Left atrial abnormality  Results for orders placed during the hospital encounter of 05/07/12  CBC WITH DIFFERENTIAL      Result Value Range   WBC 25.8 (*) 4.0 - 10.5 K/uL   RBC 5.77 (*) 3.87 - 5.11 MIL/uL   Hemoglobin 17.0 (*) 12.0 - 15.0 g/dL   HCT 16.1 (*) 09.6 - 04.5 %   MCV 87.7  78.0 - 100.0 fL   MCH 29.5  26.0 - 34.0 pg   MCHC 33.6  30.0 - 36.0 g/dL   RDW 40.9  81.1 - 91.4 %   Platelets 331  150 - 400 K/uL   Neutrophils Relative 88 (*) 43 - 77 %   Lymphocytes Relative 5 (*) 12 - 46 %   Monocytes Relative 5  3 - 12 %   Eosinophils Relative 0  0 - 5 %   Basophils Relative 0  0 - 1 %   Band Neutrophils  2  0 - 10 %   Neutro Abs 23.2 (*) 1.7 - 7.7 K/uL   Lymphs Abs 1.3  0.7 - 4.0 K/uL   Monocytes Absolute 1.3 (*) 0.1 - 1.0 K/uL   Eosinophils Absolute 0.0  0.0 - 0.7 K/uL   Basophils Absolute 0.0  0.0 - 0.1 K/uL  BASIC METABOLIC PANEL      Result Value Range   Sodium 136  135 - 145 mEq/L   Potassium 4.5  3.5 - 5.1 mEq/L   Chloride 99  96 - 112 mEq/L   CO2 22  19 - 32 mEq/L   Glucose, Bld 195 (*) 70 - 99 mg/dL   BUN 30 (*) 6 - 23 mg/dL   Creatinine, Ser 7.82  0.50 - 1.10 mg/dL   Calcium 95.6  8.4 - 21.3 mg/dL   GFR calc non Af Amer 64 (*) >90 mL/min   GFR calc Af Amer 74 (*) >90 mL/min  LIPASE, BLOOD      Result Value Range   Lipase 127 (*) 11 - 59 U/L  URINALYSIS, ROUTINE W REFLEX MICROSCOPIC      Result Value Range   Color, Urine AMBER (*) YELLOW   APPearance CLOUDY (*) CLEAR   Specific Gravity, Urine 1.029  1.005 - 1.030   pH 5.5  5.0 - 8.0   Glucose, UA NEGATIVE  NEGATIVE mg/dL   Hgb urine dipstick NEGATIVE  NEGATIVE   Bilirubin Urine NEGATIVE  NEGATIVE   Ketones, ur 15 (*) NEGATIVE mg/dL   Protein, ur 30 (*) NEGATIVE mg/dL   Urobilinogen, UA 0.2  0.0 - 1.0 mg/dL   Nitrite NEGATIVE  NEGATIVE   Leukocytes, UA NEGATIVE  NEGATIVE  URINE MICROSCOPIC-ADD ON      Result Value Range   Squamous Epithelial / LPF FEW (*) RARE   Bacteria, UA RARE  RARE   Urine-Other MUCOUS PRESENT    TROPONIN I      Result Value Range   Troponin I <0.30  <0.30 ng/mL  DIGOXIN LEVEL      Result  Value Range   Digoxin Level 0.5 (*) 0.8 - 2.0 ng/mL   Dg Chest 2 View  05/07/2012  *RADIOLOGY REPORT*  Clinical Data: Nausea, diarrhea, vomiting, hypertension, history of diabetes  CHEST - 2 VIEW  Comparison: March 14, 2009  Findings: There is no focal infiltrate, pulmonary edema, or pleural effusion.  The right hemidiaphragm is elevated worse compared to prior exam.  The aorta is tortuous.  The heart size is normal.  The patient status post prior fixation of the lower cervical spine and of the lumbar  spine.  Surgical clips are identified in the right upper quadrant consistent with prior cholecystectomy.  Degenerative joint changes of the spine are noted.  IMPRESSION: No acute cardiopulmonary disease identified.   Original Report Authenticated By: Sherian Rein, M.D.    Ct Abdomen Pelvis W Contrast  05/07/2012  *RADIOLOGY REPORT*  Clinical Data: Nausea, vomiting, diarrhea, abdominal cramping, status post hysterectomy, status post cholecystectomy  CT ABDOMEN AND PELVIS WITH CONTRAST  Technique:  Multidetector CT imaging of the abdomen and pelvis was performed following the standard protocol during bolus administration of intravenous contrast.  Contrast: 50mL OMNIPAQUE IOHEXOL 300 MG/ML  SOLN, OMNIPAQUE IOHEXOL 300 MG/ML  SOLN  Comparison: None.  Findings: Lung bases are unremarkable.  Sagittal images of the spine shows postsurgical changes at the T12-L1 level.  Degenerative changes are noted lumbar spine multilevel.  Mild hepatic fatty infiltration.  Status post cholecystectomy.  The pancreas, spleen and adrenal glands are unremarkable.  Kidneys shows a lobulated contour.  There is symmetrical enhancement.  No focal renal mass.  No hydronephrosis or hydroureter.  Delayed renal images shows bilateral renal symmetrical excretion.  Bilateral visualized proximal ureter is unremarkable.  There is no small bowel obstruction.  Mild distention of the jejunal loops without evidence of transition in caliber.  No surrounding inflammation. Mild enteritis cannot be excluded. Colon is partially collapsed with fluid.  Normal appendix is clearly visualized axial image 55.  There is no pericecal inflammation.  The patient is status post hysterectomy.  Fluid is noted in the rectosigmoid colon.  The urinary bladder is unremarkable.  IMPRESSION:  1.  Fatty infiltration of the liver.  Status post cholecystectomy. 2.  Nonspecific mild distention of jejunal bowel loops without surrounding inflammation.  Mild enteritis cannot be  excluded. No small bowel obstruction.   3.  No hydronephrosis or hydroureter.  Bilateral renal symmetrical excretion. 4.  Normal appendix.  No pericecal inflammation.   Original Report Authenticated By: Natasha Mead, M.D.     1. Abdominal pain       MDM  Patient is a 72 y/o F c/o abdominal pain, nausea, vomiting, diarrhea x 1 day. Patient reported difficulty breathing. Denied fever, chest pain, shortness of breathe, ear pain, eye pain.  I personally evaluated and examined the patient. Discussed case with Dr. Lorre Nick PE: Alert and oriented. Abdomen: Non-distended, BS normoactive, tenderness upon palpation to all quadrants, tenderness upon epigastrum extreme.   Ordered EKG, troponin, CBC, BMP, Lipase, UA, urine microscopic, Chest x-ray, Digoxin level  Zofran given for nausea/vomting Dilaudid and GI cocktail given for abdominal discomfort  CBC: High WBC (25.8), low Hgb and Hct consistent with previous labs reviewed BMP: high BUN, negative findings Lipase: High (127) Chest x-ray: no acute cardiopulmonary disease. Troponins: negative (< 0.30) UA: cloudy, proteins (30), ketones (15); negative Hgb, bilirubin, nitrites, leukocytes Urine microscopic: rare bacteria, mucus present Digoxin levels: Low levels (0.5) CT Scan with contrast of Abdomen Pelvis:  Pancreas, spleen, and  adrenal glands unremarkable. No small bowel obstruction. No surrounding inflammation to bowels. Negative findings.   Discussed case with Dr. Lorre Nick - patient will be admitted to [facility] for Telemetry with contact isolation for possible acute pancreatitis - admission orders filled out by Dr. Lorre Nick, Dr. Lorre Nick spoke with admitting physician at [facility].          Raymon Mutton, PA-C 05/07/12 2233

## 2012-05-07 NOTE — ED Notes (Signed)
Pt. Walked steady gait to restroom and back to room 11.  No distress noted in Pt. And no complaints at present time.  IV patent and flushes well.

## 2012-05-07 NOTE — ED Notes (Signed)
Pt. Asking for ice chips.  Explained why she cant due to her complaints of nausea vomiting and diarrhea.

## 2012-05-07 NOTE — ED Provider Notes (Signed)
4:06 PM  Date: 05/07/2012  Rate: 105  Rhythm: sinus tachycardia  QRS Axis: right  Intervals: normal PQRS:  Left atrial abnormality  ST/T Wave abnormalities: nonspecific ST/T changes  Conduction Disutrbances:none  Narrative Interpretation: Abnormal EKG  Old EKG Reviewed: none available    Carleene Cooper III, MD 05/07/12 [phone]

## 2012-05-07 NOTE — H&P (Signed)
History and Physical  DULA HAVLIK ZOX:[id number] DOB: December 24, 1940 DOA: 05/07/2012  Referring physician: [facility] [place] PCP: No primary provider on file.   Chief Complaint: Nausea vomiting and diarrhea  HPI: Patient is a 72 y/o F with PMHx DM, HTN, HLD and PAT c/o abdominal pain, nausea, emesis, and diarrhea x 1 day. Patient reported that symptoms started this morning (05/07/2012) at approximately 2:00-3:00am. Patient reported that she did not feel like herself yesterday (05/06/2012). Patient reported abdominal pain as being generalized that is a constant, sharp pain, 10/10 in intensity, non radiating. No aggravating and relieving factors. Patient reported no blood in emesis and diarrhea. Patient reported emesis to be mainly of stomach contents and diarrhea to be loose-watery consistency. Patient reported that Sunday night family went out to eat for dinner at a [place]; daughter, grandson, and herself ate chicken that night and all have similar symptoms. Patient reported difficulty breathing. Denied fever, sore throat, eye pain, ear pain, chest pain, shortness of breathe.    Review of Systems:  15 point review of systems is negative except what is noted above  Past Medical History  Diagnosis Date  . Diabetes mellitus   . PAT (paroxysmal atrial tachycardia)   . Hypertension   . Reflux   . Depression   . Anxiety   . Restless leg syndrome   . Hyperlipemia   . Hypertriglyceridemia   . Fibromyalgia     Past Surgical History  Procedure Laterality Date  . Cholecystectomy    . Depression    . Anxiety    . Back surgery      T12-L1 fusion, Anterior Cervical fusion  . Tonsillectomy    . Fracture surgery    . Knee surgery      x 3  . Excision morton's neuroma    . Abdominal hysterectomy    . Upper gastrointestinal endoscopy    . Colonoscopy    . Esophagogastroduodenoscopy  12/21/2010    Procedure: ESOPHAGOGASTRODUODENOSCOPY (EGD);  Surgeon: Freddy Jaksch, MD;   Location: [facility];  Service: Endoscopy;  Laterality: N/A;    Social History:  reports that she has never smoked. She does not have any smokeless tobacco history on file. She reports that she does not drink alcohol or use illicit drugs.  Allergies  Allergen Reactions  . Amoxil (Amoxicillin)   . Chocolate Hives  . Demerol Nausea And Vomiting  . Lisinopril Itching and Swelling    No family history on file.   Prior to Admission medications   Medication Sig Start Date End Date Taking? Authorizing Provider  Exenatide (BYDUREON [place]) Inject into the skin.   Yes Historical Provider, MD  aspirin 81 MG tablet Take 81 mg by mouth daily.     Historical Provider, MD  calcium-vitamin D (OSCAL WITH D) 500-200 MG-UNIT per tablet Take 2 tablets by mouth 2 (two) times daily.     Historical Provider, MD  cetirizine (ZYRTEC) 10 MG tablet Take 10 mg by mouth 2 (two) times daily.     Historical Provider, MD  cholecalciferol (VITAMIN D) 1000 UNITS tablet Take 50,000 Units by mouth 2 (two) times a week. Last taken on Tuesday 12-20-10    Historical Provider, MD  Choline Fenofibrate (TRILIPIX) 135 MG capsule Take 135 mg by mouth daily.     Historical Provider, MD  clopidogrel (PLAVIX) 75 MG tablet Take 75 mg by mouth daily.     Historical Provider, MD  digoxin (LANOXIN) 0.25 MG tablet Take 250 mcg by  mouth daily.     Historical Provider, MD  DULoxetine (CYMBALTA) 60 MG capsule Take 60 mg by mouth daily. As needed for fibromyalgia     Historical Provider, MD  exenatide (BYETTA) 10 MCG/0.04ML SOLN Inject 10 mcg into the skin 2 (two) times daily with a meal.     Historical Provider, MD  ezetimibe (ZETIA) 10 MG tablet Take 10 mg by mouth daily.     Historical Provider, MD  fish oil-omega-3 fatty acids 1000 MG capsule Take 2 g by mouth 2 (two) times daily.     Historical Provider, MD  insulin glargine (LANTUS) 100 UNIT/ML injection Inject 66 Units into the skin at bedtime.     Historical Provider, MD  metFORMIN  (GLUCOPHAGE) 1000 MG tablet Take 1,000 mg by mouth 2 (two) times daily with a meal.     Historical Provider, MD  metoprolol succinate (TOPROL-XL) 25 MG 24 hr tablet Take 25 mg by mouth daily.     Historical Provider, MD  nitroGLYCERIN (NITROSTAT) 0.4 MG SL tablet Place 0.4 mg under the tongue every 5 (five) minutes as needed. As needed chest pain    Historical Provider, MD  omeprazole (PRILOSEC) 40 MG capsule Take 40 mg by mouth 2 (two) times daily. Take one tablet thirty minutes before breakfast, and another tablet thirty minutes before dinner.  12/21/10   Willis Modena, MD  ondansetron (ZOFRAN) 4 MG tablet Take 4 mg by mouth every 8 (eight) hours as needed.     Historical Provider, MD  pravastatin (PRAVACHOL) 40 MG tablet Take 40 mg by mouth daily.     Historical Provider, MD  ramipril (ALTACE) 10 MG capsule Take 10 mg by mouth daily.     Historical Provider, MD  rOPINIRole (REQUIP) 1 MG tablet Take 1 mg by mouth at bedtime.     Historical Provider, MD  verapamil (VERELAN PM) 240 MG 24 hr capsule Take 240 mg by mouth daily.     Historical Provider, MD   Physical Exam: Filed Vitals:   05/07/12 1447 05/07/12 1734 05/07/12 1949 05/07/12 2110  BP:  156/87  139/85  Pulse:  113 94 110  Temp: 97.3 F (36.3 C)   99.1 F (37.3 C)  TempSrc:    Oral  Resp:  18  18  Height:    5' 4.5" (1.638 m)  Weight:    209 lb 10.5 oz (95.1 kg)  SpO2:  100%  96%   Physical Exam: General: Vital signs reviewed and noted. Well-developed, well-nourished, in no acute distress; alert, appropriate and cooperative throughout examination.  Head: Normocephalic, atraumatic.  Eyes: PERRL, EOMI, No signs of anemia or jaundince.  Nose: Mucous membranes moist, not inflammed, nonerythematous.  Throat: Oropharynx nonerythematous, no exudate appreciated.   Neck: No deformities, masses, or tenderness noted.Supple, No carotid Bruits, no JVD.  Lungs:  Normal respiratory effort. Clear to auscultation BL without crackles or  wheezes.  Heart: RRR. S1 and S2 normal without gallop, murmur, or rubs.  Abdomen:  BS normoactive. Soft, Nondistended, non-tender.  No masses or organomegaly.  Extremities: No pretibial edema.  Neurologic: A&O X3, CN II - XII are grossly intact. Motor strength is 5/5 in the all 4 extremities, Sensations intact to light touch, Cerebellar signs negative.  Skin: No visible rashes, scars.     Wt Readings from Last 3 Encounters:  05/07/12 209 lb 10.5 oz (95.1 kg)  12/23/10 200 lb (90.719 kg)  12/21/10 194 lb (87.998 kg)    Labs on Admission:  Basic  Metabolic Panel:  Recent Labs Lab 05/07/12 1540  NA 136  K 4.5  CL 99  CO2 22  GLUCOSE 195*  BUN 30*  CREATININE 0.90  CALCIUM 10.2     Recent Labs Lab 05/07/12 1540  LIPASE 127*   CBC:  Recent Labs Lab 05/07/12 1540  WBC 25.8*  NEUTROABS 23.2*  HGB 17.0*  HCT 50.6*  MCV 87.7  PLT 331    Cardiac Enzymes:  Recent Labs Lab 05/07/12 1845  TROPONINI <0.30    Radiological Exams on Admission: Dg Chest 2 View  05/07/2012  *RADIOLOGY REPORT*  Clinical Data: Nausea, diarrhea, vomiting, hypertension, history of diabetes  CHEST - 2 VIEW  Comparison: March 14, 2009  Findings: There is no focal infiltrate, pulmonary edema, or pleural effusion.  The right hemidiaphragm is elevated worse compared to prior exam.  The aorta is tortuous.  The heart size is normal.  The patient status post prior fixation of the lower cervical spine and of the lumbar spine.  Surgical clips are identified in the right upper quadrant consistent with prior cholecystectomy.  Degenerative joint changes of the spine are noted.  IMPRESSION: No acute cardiopulmonary disease identified.   Original Report Authenticated By: Sherian Rein, M.D.    Ct Abdomen Pelvis W Contrast  05/07/2012  *RADIOLOGY REPORT*  Clinical Data: Nausea, vomiting, diarrhea, abdominal cramping, status post hysterectomy, status post cholecystectomy  CT ABDOMEN AND PELVIS WITH CONTRAST   Technique:  Multidetector CT imaging of the abdomen and pelvis was performed following the standard protocol during bolus administration of intravenous contrast.  Contrast: 50mL OMNIPAQUE IOHEXOL 300 MG/ML  SOLN, OMNIPAQUE IOHEXOL 300 MG/ML  SOLN  Comparison: None.  Findings: Lung bases are unremarkable.  Sagittal images of the spine shows postsurgical changes at the T12-L1 level.  Degenerative changes are noted lumbar spine multilevel.  Mild hepatic fatty infiltration.  Status post cholecystectomy.  The pancreas, spleen and adrenal glands are unremarkable.  Kidneys shows a lobulated contour.  There is symmetrical enhancement.  No focal renal mass.  No hydronephrosis or hydroureter.  Delayed renal images shows bilateral renal symmetrical excretion.  Bilateral visualized proximal ureter is unremarkable.  There is no small bowel obstruction.  Mild distention of the jejunal loops without evidence of transition in caliber.  No surrounding inflammation. Mild enteritis cannot be excluded. Colon is partially collapsed with fluid.  Normal appendix is clearly visualized axial image 55.  There is no pericecal inflammation.  The patient is status post hysterectomy.  Fluid is noted in the rectosigmoid colon.  The urinary bladder is unremarkable.  IMPRESSION:  1.  Fatty infiltration of the liver.  Status post cholecystectomy. 2.  Nonspecific mild distention of jejunal bowel loops without surrounding inflammation.  Mild enteritis cannot be excluded. No small bowel obstruction.   3.  No hydronephrosis or hydroureter.  Bilateral renal symmetrical excretion. 4.  Normal appendix.  No pericecal inflammation.   Original Report Authenticated By: Natasha Mead, M.D.    Principal Problem:   Acute gastroenteritis Active Problems:   Paroxysmal atrial tachycardia   Depression   Fibromyalgia   Type II or unspecified type diabetes mellitus with neurological manifestations, not stated as uncontrolled(250.60)   Essential hypertension,  benign   Other and unspecified hyperlipidemia   Assessment/Plan  Patient is 72 year old female with past medical history most significant for diabetes, hypertension, hyperlipidemia, depression, fibromyalgia and paroxysmal atrial tachycardia who complains of symptoms consistent with acute gastroenteritis. I will start her on IV antibiotics for presumptive bacterial gastroenteritis  with Cipro and Flagyl. Given that patient has high white cell count with left shift and not being a very frequent care seeker I believe that this is something which needs to be treated aggressively at least initially.  -Keep n.p.o. for tonight and start clear liquid diet in the morning -IV fluids at 125 cc an hour normal saline -IV ciprofloxacin and Flagyl -Supportive treatment  Type 2 diabetes : Sliding scale insulin for diabetes and hold oral hypoglycemics and diet  Paroxysmal atrial tachycardia: Continue metoprolol and verapamil  Continue home medications for other medical problems.  Code Status: Full code Family Communication: Updated at bedside Disposition Plan/Anticipated LOS: Guarded  Time spent: 75 minutes  Lars Mage, MD  [facility] Team 5  If 7PM-7AM, please contact night-coverage at [email or web address], password [facility] 05/07/2012, 9:17 PM

## 2012-05-07 NOTE — ED Notes (Signed)
Vitals with EMS B/P 120 82  HR 99  Resp 16   Sat. 98%  CBG is 289

## 2012-05-07 NOTE — Progress Notes (Signed)
Dr. Lorre Nick, EDP at [facility] requested transfer of this patient to [facility] for further evaluation, observation and management. Patient with history of diabetes, hypertension, hyperlipidemia, paroxysmal atrial tachycardia presented with one-day history of intractable nausea, vomiting, diarrhea and abdominal pain. Vital signs stable. Marked leukocytosis. CT abdomen does not show any acute findings. No chest pain. Accepted patient for transfer to telemetry bed, observation status, TRH team 10. Advised Dr. Freida Busman & [facility] to call Flow Manager when patient arrives.  [name] 6:30 PM 05/07/2012.

## 2012-05-07 NOTE — ED Notes (Signed)
Pt. Reports she had an egg sandwich this morning.

## 2012-05-08 DIAGNOSIS — F329 Major depressive disorder, single episode, unspecified: Secondary | ICD-10-CM

## 2012-05-08 DIAGNOSIS — I10 Essential (primary) hypertension: Secondary | ICD-10-CM

## 2012-05-08 DIAGNOSIS — F3289 Other specified depressive episodes: Secondary | ICD-10-CM

## 2012-05-08 DIAGNOSIS — K5289 Other specified noninfective gastroenteritis and colitis: Principal | ICD-10-CM

## 2012-05-08 LAB — CBC WITH DIFFERENTIAL/PLATELET
Basophils Absolute: 0 K/uL (ref 0.0–0.1)
Basophils Relative: 0 % (ref 0–1)
Eosinophils Absolute: 0.1 K/uL (ref 0.0–0.7)
Eosinophils Relative: 1 % (ref 0–5)
HCT: 40.4 % (ref 36.0–46.0)
Hemoglobin: 13.3 g/dL (ref 12.0–15.0)
Lymphocytes Relative: 15 % (ref 12–46)
Lymphs Abs: 1.4 K/uL (ref 0.7–4.0)
MCH: 29.1 pg (ref 26.0–34.0)
MCHC: 32.9 g/dL (ref 30.0–36.0)
MCV: 88.4 fL (ref 78.0–100.0)
Monocytes Absolute: 0.6 K/uL (ref 0.1–1.0)
Monocytes Relative: 6 % (ref 3–12)
Neutro Abs: 7.1 K/uL (ref 1.7–7.7)
Neutrophils Relative %: 78 % — ABNORMAL HIGH (ref 43–77)
Platelets: 228 K/uL (ref 150–400)
RBC: 4.57 MIL/uL (ref 3.87–5.11)
RDW: 14 % (ref 11.5–15.5)
WBC: 9.2 K/uL (ref 4.0–10.5)

## 2012-05-08 LAB — CLOSTRIDIUM DIFFICILE BY PCR: Toxigenic C. Difficile by PCR: NEGATIVE

## 2012-05-08 LAB — GLUCOSE, CAPILLARY
Glucose-Capillary: 95 mg/dL (ref 70–99)
Glucose-Capillary: 135 mg/dL — ABNORMAL HIGH (ref 70–99)

## 2012-05-08 LAB — COMPREHENSIVE METABOLIC PANEL WITH GFR
ALT: 35 U/L (ref 0–35)
AST: 34 U/L (ref 0–37)
Albumin: 3.2 g/dL — ABNORMAL LOW (ref 3.5–5.2)
Alkaline Phosphatase: 60 U/L (ref 39–117)
BUN: 22 mg/dL (ref 6–23)
CO2: 25 meq/L (ref 19–32)
Calcium: 8.7 mg/dL (ref 8.4–10.5)
Chloride: 101 meq/L (ref 96–112)
Creatinine, Ser: 0.79 mg/dL (ref 0.50–1.10)
GFR calc Af Amer: >90 mL/min
GFR calc non Af Amer: 83 mL/min — ABNORMAL LOW
Glucose, Bld: 107 mg/dL — ABNORMAL HIGH (ref 70–99)
Potassium: 4.2 meq/L (ref 3.5–5.1)
Sodium: 136 meq/L (ref 135–145)
Total Bilirubin: 0.5 mg/dL (ref 0.3–1.2)
Total Protein: 5.9 g/dL — ABNORMAL LOW (ref 6.0–8.3)

## 2012-05-08 MED ORDER — SACCHAROMYCES BOULARDII 250 MG PO CAPS
250.0000 mg | ORAL_CAPSULE | Freq: Two times a day (BID) | ORAL | Status: DC
  Administered 2012-05-08: 250 mg via ORAL
  Filled 2012-05-08 (×2): qty 1

## 2012-05-08 MED ORDER — INSULIN GLARGINE 100 UNIT/ML ~~LOC~~ SOLN
15.0000 [IU] | Freq: Every day | SUBCUTANEOUS | Status: DC
  Filled 2012-05-08: qty 0.15

## 2012-05-08 MED ORDER — LOPERAMIDE HCL 2 MG PO CAPS
2.0000 mg | ORAL_CAPSULE | ORAL | Status: DC | PRN
  Administered 2012-05-08: 2 mg via ORAL
  Filled 2012-05-08: qty 1

## 2012-05-08 MED ORDER — VITAMIN D (ERGOCALCIFEROL) 1.25 MG (50000 UNIT) PO CAPS: 50000.0000 [IU] | ORAL_CAPSULE | ORAL | Status: DC

## 2012-05-08 MED ORDER — CIPROFLOXACIN HCL 500 MG PO TABS: 500.0000 mg | ORAL_TABLET | Freq: Two times a day (BID) | ORAL | Status: DC

## 2012-05-08 MED ORDER — METRONIDAZOLE 500 MG PO TABS: 500.0000 mg | ORAL_TABLET | Freq: Three times a day (TID) | ORAL | Status: DC

## 2012-05-08 MED ORDER — ACETAMINOPHEN 325 MG PO TABS
650.0000 mg | ORAL_TABLET | ORAL | Status: DC | PRN
  Administered 2012-05-08: 650 mg via ORAL
  Filled 2012-05-08: qty 2

## 2012-05-08 MED ORDER — PRAVASTATIN SODIUM 40 MG PO TABS
40.0000 mg | ORAL_TABLET | Freq: Every day | ORAL | Status: DC
  Filled 2012-05-08: qty 1

## 2012-05-08 NOTE — Care Management Note (Signed)
    Page 1 of 1   05/08/2012     4:10:33 PM   CARE MANAGEMENT NOTE 05/08/2012  Patient:  [name], [name]   Account Number:  [id number]  Date Initiated:  05/08/2012  Documentation initiated by:  Letha Cape  Subjective/Objective Assessment:   dx acute gastroenteritis  admit- lives with spouse.     Action/Plan:   Anticipated DC Date:  05/10/2012   Anticipated DC Plan:  HOME/SELF CARE      DC Planning Services  CM consult      Choice offered to / List presented to:             Status of service:  Completed, signed off Medicare Important Message given?   (If response is "NO", the following Medicare IM given date fields will be blank) Date Medicare IM given:   Date Additional Medicare IM given:    Discharge Disposition:  HOME/SELF CARE  Per UR Regulation:  Reviewed for med. necessity/level of care/duration of stay  If discussed at Long Length of Stay Meetings, dates discussed:    Comments:  05/08/12 14:09 Letha Cape RN, BSN [phone] patient lives with spouse, pta indep.  Patient has medication coverage.  Patient has transportation , no needs anticipated.

## 2012-05-08 NOTE — Progress Notes (Signed)
Patient discharge teaching given, including activity, diet, follow-up appoints, and medications. Patient verbalized understanding of all discharge instructions. IV access was d/c'd. Vitals are stable. Skin is intact except as charted in most recent assessments. Pt to be escorted out by NT, to be driven home by family. 

## 2012-05-08 NOTE — Discharge Summary (Signed)
PATIENT DETAILS Name: [name] Age: 72 y.o. Sex: female Date of Birth: 1940-09-06 MRN: [id number]. Admit Date: 05/07/2012 Admitting Physician: Elease Etienne, MD PCP:No primary provider on file.  Recommendations for Outpatient Follow-up:  1. General Health Maintenance  PRIMARY DISCHARGE DIAGNOSIS:  Principal Problem:   Acute gastroenteritis Active Problems:   Type II or unspecified type diabetes mellitus with neurological manifestations, not stated as uncontrolled(250.60)   Paroxysmal atrial tachycardia   Essential hypertension, benign   Depression   Other and unspecified hyperlipidemia   Fibromyalgia      PAST MEDICAL HISTORY: Past Medical History  Diagnosis Date  . Diabetes mellitus   . PAT (paroxysmal atrial tachycardia)   . Hypertension   . Reflux   . Depression   . Anxiety   . Restless leg syndrome   . Hyperlipemia   . Hypertriglyceridemia   . Fibromyalgia   . Arthritis     DISCHARGE MEDICATIONS:   Medication List    TAKE these medications       ALIGN PO  Take 1 tablet by mouth at bedtime and may repeat dose one time if needed.     aspirin 81 MG tablet  Take 81 mg by mouth daily.     BYDUREON 2 MG Susr  Generic drug:  Exenatide  Inject 2 mg into the skin once a week. On Sunday     cetirizine 10 MG tablet  Commonly known as:  ZYRTEC  Take 10 mg by mouth 2 (two) times daily.     cholecalciferol 1000 UNITS tablet  Commonly known as:  VITAMIN D  Take 50,000 Units by mouth 2 (two) times a week. Last taken on Tuesday 12-20-10     ciprofloxacin 500 MG tablet  Commonly known as:  CIPRO  Take 1 tablet (500 mg total) by mouth 2 (two) times daily.     digoxin 0.25 MG tablet  Commonly known as:  LANOXIN  Take 250 mcg by mouth daily.     DULoxetine 60 MG capsule  Commonly known as:  CYMBALTA  Take 60 mg by mouth daily. As needed for fibromyalgia       ezetimibe 10 MG tablet  Commonly known as:  ZETIA  Take 10 mg by mouth daily.     ICAPS  AREDS FORMULA PO  Take 1 tablet by mouth 2 (two) times daily.     insulin glargine 100 UNIT/ML injection  Commonly known as:  LANTUS  Inject 66 Units into the skin at bedtime.     metFORMIN 1000 MG tablet  Commonly known as:  GLUCOPHAGE  Take 1,000 mg by mouth 2 (two) times daily with a meal.     metoprolol succinate 25 MG 24 hr tablet  Commonly known as:  TOPROL-XL  Take 25 mg by mouth daily.     metroNIDAZOLE 500 MG tablet  Commonly known as:  FLAGYL  Take 1 tablet (500 mg total) by mouth 3 (three) times daily.     nitroGLYCERIN 0.4 MG SL tablet  Commonly known as:  NITROSTAT  Place 0.4 mg under the tongue every 5 (five) minutes as needed. As needed chest pain     omeprazole 40 MG capsule  Commonly known as:  PRILOSEC  Take 40 mg by mouth 2 (two) times daily. Take one tablet thirty minutes before breakfast, and another tablet thirty minutes before dinner.     ondansetron 4 MG tablet  Commonly known as:  ZOFRAN  Take 4 mg by mouth every 8 (eight)  hours as needed.     pravastatin 40 MG tablet  Commonly known as:  PRAVACHOL  Take 40 mg by mouth daily.     ramipril 10 MG capsule  Commonly known as:  ALTACE  Take 10 mg by mouth daily.     rOPINIRole 1 MG tablet  Commonly known as:  REQUIP  Take 1 mg by mouth at bedtime.     TRILIPIX 135 MG capsule  Generic drug:  Choline Fenofibrate  Take 135 mg by mouth daily.     verapamil 240 MG 24 hr capsule  Commonly known as:  VERELAN PM  Take 240 mg by mouth daily.     VITAMIN B COMPLEX IJ  Inject as directed every 30 (thirty) days. Receives at Dr. Jillyn Hidden         BRIEF HPI:  See H&P, Labs, Consult and Test reports for all details in brief,Patient is a 72 y/o F with PMHx DM, HTN, HLD and PAT c/o abdominal pain, nausea, emesis, and diarrhea x 1 day. She was then admitted to the hospitalist service for further evaluation   CONSULTATIONS:   None  PERTINENT RADIOLOGIC STUDIES: Dg Chest 2 View  05/07/2012  *RADIOLOGY  REPORT*  Clinical Data: Nausea, diarrhea, vomiting, hypertension, history of diabetes  CHEST - 2 VIEW  Comparison: March 14, 2009  Findings: There is no focal infiltrate, pulmonary edema, or pleural effusion.  The right hemidiaphragm is elevated worse compared to prior exam.  The aorta is tortuous.  The heart size is normal.  The patient status post prior fixation of the lower cervical spine and of the lumbar spine.  Surgical clips are identified in the right upper quadrant consistent with prior cholecystectomy.  Degenerative joint changes of the spine are noted.  IMPRESSION: No acute cardiopulmonary disease identified.   Original Report Authenticated By: Sherian Rein, M.D.    Ct Abdomen Pelvis W Contrast  05/07/2012  *RADIOLOGY REPORT*  Clinical Data: Nausea, vomiting, diarrhea, abdominal cramping, status post hysterectomy, status post cholecystectomy  CT ABDOMEN AND PELVIS WITH CONTRAST  Technique:  Multidetector CT imaging of the abdomen and pelvis was performed following the standard protocol during bolus administration of intravenous contrast.  Contrast: 50mL OMNIPAQUE IOHEXOL 300 MG/ML  SOLN, OMNIPAQUE IOHEXOL 300 MG/ML  SOLN  Comparison: None.  Findings: Lung bases are unremarkable.  Sagittal images of the spine shows postsurgical changes at the T12-L1 level.  Degenerative changes are noted lumbar spine multilevel.  Mild hepatic fatty infiltration.  Status post cholecystectomy.  The pancreas, spleen and adrenal glands are unremarkable.  Kidneys shows a lobulated contour.  There is symmetrical enhancement.  No focal renal mass.  No hydronephrosis or hydroureter.  Delayed renal images shows bilateral renal symmetrical excretion.  Bilateral visualized proximal ureter is unremarkable.  There is no small bowel obstruction.  Mild distention of the jejunal loops without evidence of transition in caliber.  No surrounding inflammation. Mild enteritis cannot be excluded. Colon is partially collapsed with  fluid.  Normal appendix is clearly visualized axial image 55.  There is no pericecal inflammation.  The patient is status post hysterectomy.  Fluid is noted in the rectosigmoid colon.  The urinary bladder is unremarkable.  IMPRESSION:  1.  Fatty infiltration of the liver.  Status post cholecystectomy. 2.  Nonspecific mild distention of jejunal bowel loops without surrounding inflammation.  Mild enteritis cannot be excluded. No small bowel obstruction.   3.  No hydronephrosis or hydroureter.  Bilateral renal symmetrical excretion. 4.  Normal  appendix.  No pericecal inflammation.   Original Report Authenticated By: Natasha Mead, M.D.      PERTINENT LAB RESULTS: CBC:  Recent Labs  05/07/12 1540 05/08/12 0530  WBC 25.8* 9.2  HGB 17.0* 13.3  HCT 50.6* 40.4  PLT 331 228   CMET CMP     Component Value Date/Time   NA 136 05/08/2012 0530   K 4.2 05/08/2012 0530   CL 101 05/08/2012 0530   CO2 25 05/08/2012 0530   GLUCOSE 107* 05/08/2012 0530   BUN 22 05/08/2012 0530   CREATININE 0.79 05/08/2012 0530   CALCIUM 8.7 05/08/2012 0530   PROT 5.9* 05/08/2012 0530   ALBUMIN 3.2* 05/08/2012 0530   AST 34 05/08/2012 0530   ALT 35 05/08/2012 0530   ALKPHOS 60 05/08/2012 0530   BILITOT 0.5 05/08/2012 0530   GFRNONAA 83* 05/08/2012 0530   GFRAA >90 05/08/2012 0530    GFR Estimated Creatinine Clearance: 75 ml/min (by C-G formula based on Cr of 0.79).  Recent Labs  05/07/12 1540  LIPASE 127*    Recent Labs  05/07/12 1845  TROPONINI <0.30   No components found with this basename: POCBNP,  No results found for this basename: DDIMER,  in the last 72 hours No results found for this basename: HGBA1C,  in the last 72 hours No results found for this basename: CHOL, HDL, LDLCALC, TRIG, CHOLHDL, LDLDIRECT,  in the last 72 hours No results found for this basename: TSH, T4TOTAL, FREET3, T3FREE, THYROIDAB,  in the last 72 hours No results found for this basename: VITAMINB12, FOLATE, FERRITIN, TIBC, IRON,  RETICCTPCT,  in the last 72 hours Coags: No results found for this basename: PT, INR,  in the last 72 hours Microbiology: Recent Results (from the past 240 hour(s))  CLOSTRIDIUM DIFFICILE BY PCR     Status: None   Collection Time    05/08/12  9:47 AM      Result Value Range Status   C difficile by pcr NEGATIVE  NEGATIVE Final     BRIEF HOSPITAL COURSE:   Principal Problem: Acute gastroenteritis  -did have significant leukocytosis on admission  -3 out of 5 family members who ate at a same restaurant-are down with similar illness -stool for C Diff-neg -given the fact that the patient had significant leukocytosis-she was started on Cipro and Flagyl on admission,I dont think that this is bacterial-I suggested we stop all antibiotics at discharge-however the patient requested that it be continue for a few more days-therefore will continue for total of 5 days and stop.   Active Problems: Type II or unspecified type diabetes mellitus  -CBG's stable  -patient has been instructed to take half of her lantus dosing for the next 1-2 days-and then slowly increase to her usual dosing.  Paroxysmal atrial tachycardia:  Continue Digoxin, metoprolol and verapamil   HTN  -controlled with Metoprolol, Ramipril and Verapamil   Depression  -stable  -c/w Cymbalta   Dyslipidemia  -c/w statins and Zetia   H/o IBS -prn imodium  TODAY-DAY OF DISCHARGE:  Subjective:   Myca Perno today has no headache,no chest abdominal pain,no new weakness tingling or numbness, feels much better wants to go home today.  Objective:   Blood pressure 152/78, pulse 84, temperature 98.3 F (36.8 C), temperature source Oral, resp. rate 18, height 5' 4.5" (1.638 m), weight 95.1 kg (209 lb 10.5 oz), SpO2 96.00%.  Intake/Output Summary (Last 24 hours) at 05/08/12 1632 Last data filed at 05/08/12 1114  Gross per 24  hour  Intake 1582.5 ml  Output    975 ml  Net  607.5 ml    Exam Awake Alert, Oriented *3, No  new F.N deficits, Normal affect [place].AT,PERRAL Supple Neck,No JVD, No cervical lymphadenopathy appriciated.  Symmetrical Chest wall movement, Good air movement bilaterally, CTAB RRR,No Gallops,Rubs or new Murmurs, No Parasternal Heave +ve B.Sounds, Abd Soft, Non tender, No organomegaly appriciated, No rebound -guarding or rigidity. No Cyanosis, Clubbing or edema, No new Rash or bruise  DISCHARGE CONDITION: Stable  DISPOSITION: HOME  DISCHARGE INSTRUCTIONS:    Activity:  As tolerated   Diet recommendation: Diabetic Diet Heart Healthy diet  Follow-up Information   Follow up with FULP, CAMMIE, MD. Schedule an appointment as soon as possible for a visit in 1 week.   Contact information:   [address] [place] [address] [phone]         Total Time spent on discharge equals 45 minutes.  SignedJeoffrey Massed 05/08/2012 4:32 PM

## 2012-05-08 NOTE — Progress Notes (Signed)
Diarrhea and vomiting has stopped-no abdominal pain-patient has tolerated a regular diet-and is requesting to be discharged home. Abd is soft and non tender on exam. C Diff PCR neg. Will discharge at patient's request.

## 2012-05-08 NOTE — Progress Notes (Addendum)
Dr,Garg came to see pt. & wrote orders.Called him back to ask if she needs  To be on TELE.  & if she needs to be on enteric prec. & to get stool specimen for c.diff.pcr,but he said she does'nt need to.Discontinued tele. & notified central tele.Lissa Hoard Satsuki Zillmer.RN

## 2012-05-08 NOTE — Progress Notes (Addendum)
Admitted pt.to 5504 ,from [facility] ED ,via [facility],AAO X 4,bec.of nausea vomiting & diarrhea since Sunday after eating in a [place] Rest.Oriented pt.to the room ,call bell & let her watched  the Patient safety video,& gave to pt.the Patient Care guide booklet & INP admission arm band verified with pt. & put in place.Fall risk assessment done  & discussed with pt.the Fall prevention safety plan & verbalized understanding.Skin is intact,no evidence of skin breakdown.  Called DR.GARG who is the admitting MD.& ask him if the pt.needs to be on TELE.& said to put her on Tele.for now,& he will come to see pt.Placed pt.on tele.& verified with Central tele,With IVF NS infusing well on her rt.antecub.@ 100 cc/hr.Will continue to monitor pt.Lissa Hoard Adelita Hone,RN

## 2012-05-08 NOTE — Progress Notes (Signed)
PATIENT DETAILS Name: [name] Age: 72 y.o. Sex: female Date of Birth: [birth year] Admit Date: 05/07/2012 Admitting Physician Elease Etienne, MD PCP:No primary provider on file.  Subjective: Feels better-abd pain resolved, no vomiting-diarrhea seems to be slowing down  Assessment/Plan: Principal Problem:   Acute gastroenteritis -did have significant leukocytosis on admission -3 out of 5 family members who ate at a same restaurant-are down with similar illness -check stool for C Diff-but think this is acute gastroenteritis-from ?viral food poisoning-c/w cipro and flagyl for now-day 2 -if stool C Diff neg-will start Imodium -monitor clinical course  Active Problems:   Type II or unspecified type diabetes mellitus  -CBG's stable -c/w SSI -restart  Lantus take 66 units at bedtime-will start from 25 untis-as diet advanced-suspect more insulin requirement as diet is advanced -resume Metformin on Lantus  Paroxysmal atrial tachycardia:  Continue Digoxin, metoprolol and verapamil  HTN -controlled with Metoprolol, Ramipril and Verapamil    Depression -stable -c/w Cymbalta  Dyslipidemia -c/w statins and Zetia  H/o IBS -resume Imodium if CDiff neg  Disposition: Remain inpatient-home 1-2 days  DVT Prophylaxis: Prophylactic Lovenox   Code Status: Full code   Procedures:  None  CONSULTS:  None  PHYSICAL EXAM: Vital signs in last 24 hours: Filed Vitals:   05/07/12 1949 05/07/12 2110 05/08/12 0448 05/08/12 1013  BP:  139/85 145/82 153/79  Pulse: 94 110 96 76  Temp:  99.1 F (37.3 C) 98 F (36.7 C)   TempSrc:  Oral Oral   Resp:  18 20   Height:  5' 4.5" (1.638 m)    Weight:  95.1 kg (209 lb 10.5 oz)    SpO2:  96% 98%     Weight change:  Body mass index is 35.44 kg/(m^2).   Gen Exam: Awake and alert with clear speech.   Neck: Supple, No JVD.   Chest: B/L Clear.   CVS: S1 S2 Regular, no murmurs.  Abdomen: soft, BS +, non tender, non distended.   Extremities: no edema, lower extremities warm to touch. Neurologic: Non Focal.   Skin: No Rash.   Wounds: N/A.    Intake/Output from previous day:  Intake/Output Summary (Last 24 hours) at 05/08/12 1047 Last data filed at 05/08/12 5621  Gross per 24 hour  Intake 1582.5 ml  Output    450 ml  Net 1132.5 ml     LAB RESULTS: CBC  Recent Labs Lab 05/07/12 1540 05/08/12 0530  WBC 25.8* 9.2  HGB 17.0* 13.3  HCT 50.6* 40.4  PLT 331 228  MCV 87.7 88.4  MCH 29.5 29.1  MCHC 33.6 32.9  RDW 14.2 14.0  LYMPHSABS 1.3 1.4  MONOABS 1.3* 0.6  EOSABS 0.0 0.1  BASOSABS 0.0 0.0    Chemistries   Recent Labs Lab 05/07/12 1540 05/08/12 0530  NA 136 136  K 4.5 4.2  CL 99 101  CO2 22 25  GLUCOSE 195* 107*  BUN 30* 22  CREATININE 0.90 0.79  CALCIUM 10.2 8.7    CBG:  Recent Labs Lab 05/07/12 2254 05/08/12 0740  GLUCAP 117* 95    GFR Estimated Creatinine Clearance: 75 ml/min (by C-G formula based on Cr of 0.79).  Coagulation profile No results found for this basename: INR, PROTIME,  in the last 168 hours  Cardiac Enzymes  Recent Labs Lab 05/07/12 1845  TROPONINI <0.30    No components found with this basename: POCBNP,  No results found for this basename: DDIMER,  in the last 72 hours No  results found for this basename: HGBA1C,  in the last 72 hours No results found for this basename: CHOL, HDL, LDLCALC, TRIG, CHOLHDL, LDLDIRECT,  in the last 72 hours No results found for this basename: TSH, T4TOTAL, FREET3, T3FREE, THYROIDAB,  in the last 72 hours No results found for this basename: VITAMINB12, FOLATE, FERRITIN, TIBC, IRON, RETICCTPCT,  in the last 72 hours  Recent Labs  05/07/12 1540  LIPASE 127*    Urine Studies No results found for this basename: UACOL, UAPR, USPG, UPH, UTP, UGL, UKET, UBIL, UHGB, UNIT, UROB, ULEU, UEPI, UWBC, URBC, UBAC, CAST, CRYS, UCOM, BILUA,  in the last 72 hours  MICROBIOLOGY: No results found for this or any previous visit  (from the past 240 hour(s)).  RADIOLOGY STUDIES/RESULTS: Dg Chest 2 View  05/07/2012  *RADIOLOGY REPORT*  Clinical Data: Nausea, diarrhea, vomiting, hypertension, history of diabetes  CHEST - 2 VIEW  Comparison: March 14, 2009  Findings: There is no focal infiltrate, pulmonary edema, or pleural effusion.  The right hemidiaphragm is elevated worse compared to prior exam.  The aorta is tortuous.  The heart size is normal.  The patient status post prior fixation of the lower cervical spine and of the lumbar spine.  Surgical clips are identified in the right upper quadrant consistent with prior cholecystectomy.  Degenerative joint changes of the spine are noted.  IMPRESSION: No acute cardiopulmonary disease identified.   Original Report Authenticated By: Sherian Rein, M.D.    Ct Abdomen Pelvis W Contrast  05/07/2012  *RADIOLOGY REPORT*  Clinical Data: Nausea, vomiting, diarrhea, abdominal cramping, status post hysterectomy, status post cholecystectomy  CT ABDOMEN AND PELVIS WITH CONTRAST  Technique:  Multidetector CT imaging of the abdomen and pelvis was performed following the standard protocol during bolus administration of intravenous contrast.  Contrast: 50mL OMNIPAQUE IOHEXOL 300 MG/ML  SOLN, OMNIPAQUE IOHEXOL 300 MG/ML  SOLN  Comparison: None.  Findings: Lung bases are unremarkable.  Sagittal images of the spine shows postsurgical changes at the T12-L1 level.  Degenerative changes are noted lumbar spine multilevel.  Mild hepatic fatty infiltration.  Status post cholecystectomy.  The pancreas, spleen and adrenal glands are unremarkable.  Kidneys shows a lobulated contour.  There is symmetrical enhancement.  No focal renal mass.  No hydronephrosis or hydroureter.  Delayed renal images shows bilateral renal symmetrical excretion.  Bilateral visualized proximal ureter is unremarkable.  There is no small bowel obstruction.  Mild distention of the jejunal loops without evidence of transition in caliber.   No surrounding inflammation. Mild enteritis cannot be excluded. Colon is partially collapsed with fluid.  Normal appendix is clearly visualized axial image 55.  There is no pericecal inflammation.  The patient is status post hysterectomy.  Fluid is noted in the rectosigmoid colon.  The urinary bladder is unremarkable.  IMPRESSION:  1.  Fatty infiltration of the liver.  Status post cholecystectomy. 2.  Nonspecific mild distention of jejunal bowel loops without surrounding inflammation.  Mild enteritis cannot be excluded. No small bowel obstruction.   3.  No hydronephrosis or hydroureter.  Bilateral renal symmetrical excretion. 4.  Normal appendix.  No pericecal inflammation.   Original Report Authenticated By: Natasha Mead, M.D.     MEDICATIONS: Scheduled Meds: . aspirin  81 mg Oral Daily  . calcium-vitamin D  2 tablet Oral BID  . ciprofloxacin  400 mg Intravenous Q12H  . clopidogrel  75 mg Oral Daily  . digoxin  250 mcg Oral Daily  . DULoxetine  60 mg Oral  Daily  . enoxaparin (LOVENOX) injection  40 mg Subcutaneous Q24H  . ezetimibe  10 mg Oral Daily  . insulin aspart  0-5 Units Subcutaneous QHS  . insulin aspart  0-9 Units Subcutaneous TID WC  . loratadine  10 mg Oral Daily  . metoprolol succinate  25 mg Oral Daily  . metronidazole  500 mg Intravenous Q8H  . omega-3 acid ethyl esters  2 g Oral BID  . pantoprazole  40 mg Oral Daily  . ramipril  10 mg Oral Daily  . rOPINIRole  1 mg Oral QHS  . saccharomyces boulardii  250 mg Oral BID  . simvastatin  20 mg Oral q1800  . verapamil  240 mg Oral Daily  . [START ON 05/09/2012] Vitamin D (Ergocalciferol)  50,000 Units Oral Custom   Continuous Infusions: . sodium chloride 125 mL/hr at 05/08/12 1018   PRN Meds:.acetaminophen, HYDROcodone-acetaminophen, nitroGLYCERIN, ondansetron (ZOFRAN) IV, ondansetron  Antibiotics: Anti-infectives   Start     Dose/Rate Route Frequency Ordered Stop   05/07/12 2300  ciprofloxacin (CIPRO) IVPB 400 mg     400  mg 200 mL/hr over 60 Minutes Intravenous Every 12 hours 05/07/12 2247     05/07/12 2300  metroNIDAZOLE (FLAGYL) IVPB 500 mg     500 mg 100 mL/hr over 60 Minutes Intravenous Every 8 hours 05/07/12 2247         Jeoffrey Massed, MD  [facility] Pager:[phone]  If 7PM-7AM, please contact night-coverage [email or web address] Password [facility] 05/08/2012, 10:47 AM   LOS: 1 day

## 2012-05-09 ENCOUNTER — Encounter: Payer: Medicare Other | Admitting: Physical Therapy

## 2012-05-10 NOTE — ED Provider Notes (Signed)
Medical screening examination/treatment/procedure(s) were performed by non-physician practitioner and as supervising physician I was immediately available for consultation/collaboration.  Trevis Eden T Clotiel Troop, MD 05/10/12 0734 

## 2012-05-20 ENCOUNTER — Ambulatory Visit: Payer: Medicare Other | Attending: Specialist | Admitting: *Deleted

## 2012-05-20 DIAGNOSIS — IMO0001 Reserved for inherently not codable concepts without codable children: Secondary | ICD-10-CM | POA: Insufficient documentation

## 2012-05-20 DIAGNOSIS — R5381 Other malaise: Secondary | ICD-10-CM | POA: Insufficient documentation

## 2012-05-20 DIAGNOSIS — M25519 Pain in unspecified shoulder: Secondary | ICD-10-CM | POA: Insufficient documentation

## 2012-05-22 ENCOUNTER — Ambulatory Visit: Payer: Medicare Other | Admitting: *Deleted

## 2012-05-27 ENCOUNTER — Ambulatory Visit: Payer: Medicare Other | Admitting: *Deleted

## 2012-05-29 ENCOUNTER — Encounter: Payer: Medicare Other | Admitting: *Deleted

## 2012-05-30 ENCOUNTER — Ambulatory Visit: Payer: Medicare Other | Admitting: Physical Therapy

## 2012-06-03 ENCOUNTER — Encounter: Payer: Medicare Other | Admitting: Physical Therapy

## 2012-06-11 ENCOUNTER — Ambulatory Visit: Payer: Medicare Other | Admitting: Physical Therapy

## 2012-06-12 ENCOUNTER — Ambulatory Visit: Payer: Medicare Other | Admitting: Physical Therapy

## 2012-08-13 ENCOUNTER — Encounter (HOSPITAL_BASED_OUTPATIENT_CLINIC_OR_DEPARTMENT_OTHER): Payer: Self-pay | Admitting: *Deleted

## 2012-08-13 ENCOUNTER — Emergency Department (HOSPITAL_BASED_OUTPATIENT_CLINIC_OR_DEPARTMENT_OTHER)
Admission: EM | Admit: 2012-08-13 | Discharge: 2012-08-13 | Disposition: A | Discharge status: Home / Self Care | Payer: Medicare Other | Attending: Emergency Medicine | Admitting: Emergency Medicine

## 2012-08-13 ENCOUNTER — Emergency Department (HOSPITAL_BASED_OUTPATIENT_CLINIC_OR_DEPARTMENT_OTHER): Payer: Medicare Other

## 2012-08-13 DIAGNOSIS — I471 Supraventricular tachycardia, unspecified: Secondary | ICD-10-CM | POA: Insufficient documentation

## 2012-08-13 DIAGNOSIS — Y939 Activity, unspecified: Secondary | ICD-10-CM | POA: Insufficient documentation

## 2012-08-13 DIAGNOSIS — K219 Gastro-esophageal reflux disease without esophagitis: Secondary | ICD-10-CM | POA: Insufficient documentation

## 2012-08-13 DIAGNOSIS — Z862 Personal history of diseases of the blood and blood-forming organs and certain disorders involving the immune mechanism: Secondary | ICD-10-CM | POA: Insufficient documentation

## 2012-08-13 DIAGNOSIS — W19XXXA Unspecified fall, initial encounter: Secondary | ICD-10-CM

## 2012-08-13 DIAGNOSIS — G2581 Restless legs syndrome: Secondary | ICD-10-CM | POA: Insufficient documentation

## 2012-08-13 DIAGNOSIS — E785 Hyperlipidemia, unspecified: Secondary | ICD-10-CM | POA: Insufficient documentation

## 2012-08-13 DIAGNOSIS — E119 Type 2 diabetes mellitus without complications: Secondary | ICD-10-CM | POA: Insufficient documentation

## 2012-08-13 DIAGNOSIS — IMO0001 Reserved for inherently not codable concepts without codable children: Secondary | ICD-10-CM | POA: Insufficient documentation

## 2012-08-13 DIAGNOSIS — Z79899 Other long term (current) drug therapy: Secondary | ICD-10-CM | POA: Insufficient documentation

## 2012-08-13 DIAGNOSIS — F411 Generalized anxiety disorder: Secondary | ICD-10-CM | POA: Insufficient documentation

## 2012-08-13 DIAGNOSIS — F329 Major depressive disorder, single episode, unspecified: Secondary | ICD-10-CM | POA: Insufficient documentation

## 2012-08-13 DIAGNOSIS — Z7982 Long term (current) use of aspirin: Secondary | ICD-10-CM | POA: Insufficient documentation

## 2012-08-13 DIAGNOSIS — S52599A Other fractures of lower end of unspecified radius, initial encounter for closed fracture: Secondary | ICD-10-CM | POA: Insufficient documentation

## 2012-08-13 DIAGNOSIS — F3289 Other specified depressive episodes: Secondary | ICD-10-CM | POA: Insufficient documentation

## 2012-08-13 DIAGNOSIS — M129 Arthropathy, unspecified: Secondary | ICD-10-CM | POA: Insufficient documentation

## 2012-08-13 DIAGNOSIS — Z794 Long term (current) use of insulin: Secondary | ICD-10-CM | POA: Insufficient documentation

## 2012-08-13 DIAGNOSIS — S5292XA Unspecified fracture of left forearm, initial encounter for closed fracture: Secondary | ICD-10-CM

## 2012-08-13 DIAGNOSIS — I10 Essential (primary) hypertension: Secondary | ICD-10-CM | POA: Insufficient documentation

## 2012-08-13 DIAGNOSIS — W010XXA Fall on same level from slipping, tripping and stumbling without subsequent striking against object, initial encounter: Secondary | ICD-10-CM | POA: Insufficient documentation

## 2012-08-13 DIAGNOSIS — Y929 Unspecified place or not applicable: Secondary | ICD-10-CM | POA: Insufficient documentation

## 2012-08-13 DIAGNOSIS — Z8639 Personal history of other endocrine, nutritional and metabolic disease: Secondary | ICD-10-CM | POA: Insufficient documentation

## 2012-08-13 MED ORDER — HYDROCODONE-ACETAMINOPHEN 5-325 MG PO TABS
1.0000 | ORAL_TABLET | Freq: Once | ORAL | Status: AC
  Administered 2012-08-13: 1 via ORAL
  Filled 2012-08-13: qty 1

## 2012-08-13 MED ORDER — HYDROCODONE-ACETAMINOPHEN 5-325 MG PO TABS: 2.0000 | ORAL_TABLET | ORAL | Status: DC | PRN

## 2012-08-13 NOTE — ED Notes (Signed)
Pt c/o left wrist injury x 1 hr ago 

## 2012-08-13 NOTE — ED Provider Notes (Signed)
This chart was scribed for Glynn Octave, MD, by Candelaria Stagers, ED Scribe. This patient was seen in room MH06/MH06 and the patient's care was started at 9:24 PM   History    CSN: [id number] Arrival date & time 08/13/12  2101  First MD Initiated Contact with Patient 08/13/12 2116     Chief Complaint  Patient presents with  . Wrist Pain    The history is provided by the patient. No language interpreter was used.   HPI Comments: [name] is a 72 y.o. female who presents to the Emergency Department complaining of sudden onset of left wrist pain after she tripped and fell backwards catching herself with her left hand and landing on her buttocks earlier today.  She denies hitting her head or LOC.  She has no other injuries.  She has taken tylenol with some relief.    Past Medical History  Diagnosis Date  . Diabetes mellitus   . PAT (paroxysmal atrial tachycardia)   . Hypertension   . Reflux   . Depression   . Anxiety   . Restless leg syndrome   . Hyperlipemia   . Hypertriglyceridemia   . Fibromyalgia   . Arthritis    Past Surgical History  Procedure Laterality Date  . Cholecystectomy    . Depression    . Anxiety    . Back surgery      T12-L1 fusion, Anterior Cervical fusion  . Tonsillectomy    . Fracture surgery    . Knee surgery      x 3  . Excision morton's neuroma    . Abdominal hysterectomy    . Upper gastrointestinal endoscopy    . Colonoscopy    . Esophagogastroduodenoscopy  12/21/2010    Procedure: ESOPHAGOGASTRODUODENOSCOPY (EGD);  Surgeon: Freddy Jaksch, MD;  Location: [facility];  Service: Endoscopy;  Laterality: N/A;   History reviewed. No pertinent family history. History  Substance Use Topics  . Smoking status: Never Smoker   . Smokeless tobacco: Not on file  . Alcohol Use: No   OB History   Grav Para Term Preterm Abortions TAB SAB Ect Mult Living                 Review of Systems  Musculoskeletal: Positive for arthralgias (left elbow  pain, left wrist pain).  All other systems reviewed and are negative.    Allergies  Amoxil; Chocolate; Demerol; and Lisinopril  Home Medications   Current Outpatient Rx  Name  Route  Sig  Dispense  Refill  . aspirin 81 MG tablet   Oral   Take 81 mg by mouth daily.          . B Complex Vitamins (VITAMIN B COMPLEX IJ)   Injection   Inject as directed every 30 (thirty) days. Receives at Dr. Jillyn Hidden         . cetirizine (ZYRTEC) 10 MG tablet   Oral   Take 10 mg by mouth 2 (two) times daily.          . cholecalciferol (VITAMIN D) 1000 UNITS tablet   Oral   Take 50,000 Units by mouth 2 (two) times a week. Last taken on Tuesday 12-20-10         . Choline Fenofibrate (TRILIPIX) 135 MG capsule   Oral   Take 135 mg by mouth daily.          . ciprofloxacin (CIPRO) 500 MG tablet   Oral   Take 1 tablet (500 mg  total) by mouth 2 (two) times daily.   6 tablet   0   . digoxin (LANOXIN) 0.25 MG tablet   Oral   Take 250 mcg by mouth daily.          . DULoxetine (CYMBALTA) 60 MG capsule   Oral   Take 60 mg by mouth daily. As needed for fibromyalgia          . Exenatide (BYDUREON) 2 MG SUSR   Subcutaneous   Inject 2 mg into the skin once a week. On Sunday         . ezetimibe (ZETIA) 10 MG tablet   Oral   Take 10 mg by mouth daily.          . insulin glargine (LANTUS) 100 UNIT/ML injection   Subcutaneous   Inject 66 Units into the skin at bedtime.          . metFORMIN (GLUCOPHAGE) 1000 MG tablet   Oral   Take 1,000 mg by mouth 2 (two) times daily with a meal.          . metoprolol succinate (TOPROL-XL) 25 MG 24 hr tablet   Oral   Take 25 mg by mouth daily.          . metroNIDAZOLE (FLAGYL) 500 MG tablet   Oral   Take 1 tablet (500 mg total) by mouth 3 (three) times daily.   9 tablet   0   . Multiple Vitamins-Minerals (ICAPS AREDS FORMULA PO)   Oral   Take 1 tablet by mouth 2 (two) times daily.         . nitroGLYCERIN (NITROSTAT) 0.4 MG SL  tablet   Sublingual   Place 0.4 mg under the tongue every 5 (five) minutes as needed. As needed chest pain         . omeprazole (PRILOSEC) 40 MG capsule   Oral   Take 40 mg by mouth 2 (two) times daily. Take one tablet thirty minutes before breakfast, and another tablet thirty minutes before dinner.          . ondansetron (ZOFRAN) 4 MG tablet   Oral   Take 4 mg by mouth every 8 (eight) hours as needed.          . pravastatin (PRAVACHOL) 40 MG tablet   Oral   Take 40 mg by mouth daily.          . Probiotic Product (ALIGN PO)   Oral   Take 1 tablet by mouth at bedtime and may repeat dose one time if needed.         . ramipril (ALTACE) 10 MG capsule   Oral   Take 10 mg by mouth daily.          Marland Kitchen rOPINIRole (REQUIP) 1 MG tablet   Oral   Take 1 mg by mouth at bedtime.          . verapamil (VERELAN PM) 240 MG 24 hr capsule   Oral   Take 240 mg by mouth daily.           BP 160/80  Pulse 89  Temp(Src) 98.3 F (36.8 C)  Resp 16  Ht 5\' 5"  (1.651 m)  Wt 212 lb (96.163 kg)  BMI 35.28 kg/m2  SpO2 100% Physical Exam  Nursing note and vitals reviewed. Constitutional: She is oriented to person, place, and time. She appears well-developed and well-nourished.  HENT:  Head: Normocephalic and atraumatic.  Right Ear: External ear normal.  Left Ear: External ear normal.  Nose: Nose normal.  Mouth/Throat: Oropharynx is clear and moist.  Eyes: Conjunctivae and EOM are normal. Pupils are equal, round, and reactive to light.  Neck: Normal range of motion. Neck supple.  Cardiovascular: Normal rate, regular rhythm, normal heart sounds and intact distal pulses.   Pulmonary/Chest: Effort normal and breath sounds normal.  Abdominal: Soft. Bowel sounds are normal.  Musculoskeletal: Normal range of motion.  No C-spine tenderness. Midline lumbar spine tenderness.  Tender over the left distal radius with slight dorsal deformity.  +2 radial pulse.  Cardinal hand movements are  intact.  Some pain to palpation over left lateral elbow with full ROM.    Neurological: She is alert and oriented to person, place, and time. She has normal reflexes. No cranial nerve deficit.     Skin: Skin is warm and dry.  Psychiatric: She has a normal mood and affect. Her behavior is normal. Thought content normal.    ED Course  Procedures  DIAGNOSTIC STUDIES: Oxygen Saturation is 100% on room air, normal by my interpretation.    COORDINATION OF CARE:  9:37 PM Discussed course of care with pt which includes additional images of elbow and back.  Pt understands and agrees.   10:02 PM Consult with Dr. Amanda Pea who will see pt tomorrow.  Advised pt to have sugar tong splint placed.    10:17 PM Discussed images with pt.  Will prescribe pain medication.    Labs Reviewed - No data to display Dg Lumbar Spine Complete  08/13/2012   *RADIOLOGY REPORT*  Clinical Data: Low back pain.  Injury.  LUMBAR SPINE - COMPLETE 4+ VIEW  Comparison: Of CT abdomen and pelvis 05/07/2012.  Findings: Again seen is postoperative change of T12-L1 fusion. Multilevel degenerative disc disease appears worst at L2-3 and L4- 5.  Cholecystectomy clips are noted.  IMPRESSION:  1.  No acute finding. 2.  Multilevel degenerative change.  Status post T12-L1 fusion.   Original Report Authenticated By: Holley Dexter, M.D.   Dg Elbow Complete Left  08/13/2012   *RADIOLOGY REPORT*  Clinical Data: Left elbow injury, pain.  LEFT ELBOW - COMPLETE 3+ VIEW  Comparison: None.  Findings: No acute bony or joint abnormality is identified.  There is no joint effusion.  Mild degenerative change is noted.  IMPRESSION: No acute abnormality.   Original Report Authenticated By: Holley Dexter, M.D.   Dg Wrist Complete Left  08/13/2012   *RADIOLOGY REPORT*  Clinical Data: Left wrist pain after injury.  LEFT WRIST - COMPLETE 3+ VIEW  Comparison: None.  Findings: The patient has a mildly impacted intra-articular fracture of the distal radius  with slight dorsal angulation.  No other fracture is identified.  Small accessory ossicle off the ulnar styloid is noted.  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  IMPRESSION: Mildly impacted intra-articular fracture of the left distal radius.   Original Report Authenticated By: Holley Dexter, M.D.   No diagnosis found.  MDM  Mechanical stumble and fall one hour ago landing backwards onto a trash left wrist. Denies hitting head or losing consciousness. No use of anticoagulants. No dizziness, lightheadedness, chest pain or shortness of breath.  Complaining of pain to the left wrist and low back. Denies any focal weakness, numbness or tingling. Elbow and lumbar xray negative. Wrist is closed without obvious deformity. Intra-articular distal radius fracture seen on x-ray. Neurovascularly intact.  D/w Dr. Amanda Pea who will see at 4pm tomorrow. Sugartong splint placed. Fracture splinted. Patient understands to follow up  with Dr. Amanda Pea tomorrow.  I personally performed the services described in this documentation, which was scribed in my presence. The recorded information has been reviewed and is accurate.   Glynn Octave, MD 08/13/12 [phone]

## 2012-08-28 ENCOUNTER — Other Ambulatory Visit: Payer: Self-pay

## 2012-08-28 DIAGNOSIS — Z1231 Encounter for screening mammogram for malignant neoplasm of breast: Secondary | ICD-10-CM

## 2012-10-04 ENCOUNTER — Ambulatory Visit: Payer: Medicare Other

## 2012-10-17 ENCOUNTER — Ambulatory Visit
Admission: RE | Admit: 2012-10-17 | Discharge: 2012-10-17 | Disposition: A | Discharge status: Home / Self Care | Payer: Self-pay | Source: Ambulatory Visit

## 2012-10-17 DIAGNOSIS — Z1231 Encounter for screening mammogram for malignant neoplasm of breast: Secondary | ICD-10-CM

## 2013-07-03 ENCOUNTER — Other Ambulatory Visit: Payer: Self-pay | Admitting: Gastroenterology

## 2013-07-03 DIAGNOSIS — R1310 Dysphagia, unspecified: Secondary | ICD-10-CM

## 2013-07-03 DIAGNOSIS — K219 Gastro-esophageal reflux disease without esophagitis: Secondary | ICD-10-CM

## 2013-07-09 ENCOUNTER — Ambulatory Visit
Admission: RE | Admit: 2013-07-09 | Discharge: 2013-07-09 | Disposition: A | Discharge status: Home / Self Care | Payer: Medicare Other | Source: Ambulatory Visit | Attending: Gastroenterology | Admitting: Gastroenterology

## 2013-07-09 DIAGNOSIS — R1310 Dysphagia, unspecified: Secondary | ICD-10-CM

## 2013-07-09 DIAGNOSIS — K219 Gastro-esophageal reflux disease without esophagitis: Secondary | ICD-10-CM

## 2013-07-13 DIAGNOSIS — E119 Type 2 diabetes mellitus without complications: Secondary | ICD-10-CM | POA: Insufficient documentation

## 2013-07-13 DIAGNOSIS — H35369 Drusen (degenerative) of macula, unspecified eye: Secondary | ICD-10-CM | POA: Insufficient documentation

## 2013-12-02 ENCOUNTER — Other Ambulatory Visit: Payer: Self-pay

## 2013-12-02 DIAGNOSIS — Z1231 Encounter for screening mammogram for malignant neoplasm of breast: Secondary | ICD-10-CM

## 2013-12-25 ENCOUNTER — Ambulatory Visit
Admission: RE | Admit: 2013-12-25 | Discharge: 2013-12-25 | Disposition: A | Discharge status: Home / Self Care | Payer: Medicare Other | Source: Ambulatory Visit

## 2013-12-25 DIAGNOSIS — Z1231 Encounter for screening mammogram for malignant neoplasm of breast: Secondary | ICD-10-CM

## 2014-08-03 ENCOUNTER — Ambulatory Visit
Admission: RE | Admit: 2014-08-03 | Discharge: 2014-08-03 | Disposition: A | Discharge status: Home / Self Care | Payer: Medicare Other | Source: Ambulatory Visit | Attending: Internal Medicine | Admitting: Internal Medicine

## 2014-08-03 ENCOUNTER — Other Ambulatory Visit: Payer: Self-pay | Admitting: Internal Medicine

## 2014-08-03 DIAGNOSIS — R1310 Dysphagia, unspecified: Secondary | ICD-10-CM

## 2014-08-19 ENCOUNTER — Other Ambulatory Visit: Payer: Self-pay | Admitting: Family Medicine

## 2014-08-19 ENCOUNTER — Ambulatory Visit
Admission: RE | Admit: 2014-08-19 | Discharge: 2014-08-19 | Disposition: A | Discharge status: Home / Self Care | Payer: Medicare Other | Source: Ambulatory Visit | Attending: Family Medicine | Admitting: Family Medicine

## 2014-08-19 DIAGNOSIS — R0602 Shortness of breath: Secondary | ICD-10-CM

## 2014-08-25 ENCOUNTER — Encounter (HOSPITAL_BASED_OUTPATIENT_CLINIC_OR_DEPARTMENT_OTHER): Payer: Self-pay

## 2014-08-25 ENCOUNTER — Emergency Department (HOSPITAL_BASED_OUTPATIENT_CLINIC_OR_DEPARTMENT_OTHER): Payer: Medicare Other

## 2014-08-25 ENCOUNTER — Emergency Department (HOSPITAL_BASED_OUTPATIENT_CLINIC_OR_DEPARTMENT_OTHER)
Admission: EM | Admit: 2014-08-25 | Discharge: 2014-08-25 | Disposition: A | Discharge status: Home / Self Care | Payer: Medicare Other | Attending: Emergency Medicine | Admitting: Emergency Medicine

## 2014-08-25 DIAGNOSIS — Z8739 Personal history of other diseases of the musculoskeletal system and connective tissue: Secondary | ICD-10-CM | POA: Diagnosis not present

## 2014-08-25 DIAGNOSIS — M25561 Pain in right knee: Secondary | ICD-10-CM

## 2014-08-25 DIAGNOSIS — S8991XA Unspecified injury of right lower leg, initial encounter: Secondary | ICD-10-CM | POA: Diagnosis not present

## 2014-08-25 DIAGNOSIS — F329 Major depressive disorder, single episode, unspecified: Secondary | ICD-10-CM | POA: Diagnosis not present

## 2014-08-25 DIAGNOSIS — Y998 Other external cause status: Secondary | ICD-10-CM | POA: Diagnosis not present

## 2014-08-25 DIAGNOSIS — E119 Type 2 diabetes mellitus without complications: Secondary | ICD-10-CM | POA: Insufficient documentation

## 2014-08-25 DIAGNOSIS — X58XXXA Exposure to other specified factors, initial encounter: Secondary | ICD-10-CM | POA: Insufficient documentation

## 2014-08-25 DIAGNOSIS — F419 Anxiety disorder, unspecified: Secondary | ICD-10-CM | POA: Insufficient documentation

## 2014-08-25 DIAGNOSIS — I10 Essential (primary) hypertension: Secondary | ICD-10-CM | POA: Diagnosis not present

## 2014-08-25 DIAGNOSIS — Z794 Long term (current) use of insulin: Secondary | ICD-10-CM | POA: Diagnosis not present

## 2014-08-25 DIAGNOSIS — K219 Gastro-esophageal reflux disease without esophagitis: Secondary | ICD-10-CM | POA: Diagnosis not present

## 2014-08-25 DIAGNOSIS — Z79899 Other long term (current) drug therapy: Secondary | ICD-10-CM | POA: Insufficient documentation

## 2014-08-25 DIAGNOSIS — E785 Hyperlipidemia, unspecified: Secondary | ICD-10-CM | POA: Insufficient documentation

## 2014-08-25 DIAGNOSIS — Y9289 Other specified places as the place of occurrence of the external cause: Secondary | ICD-10-CM | POA: Insufficient documentation

## 2014-08-25 DIAGNOSIS — M25569 Pain in unspecified knee: Secondary | ICD-10-CM

## 2014-08-25 DIAGNOSIS — Y9389 Activity, other specified: Secondary | ICD-10-CM | POA: Insufficient documentation

## 2014-08-25 DIAGNOSIS — G2581 Restless legs syndrome: Secondary | ICD-10-CM | POA: Diagnosis not present

## 2014-08-25 NOTE — Discharge Instructions (Signed)
Where knee immobilizer as applied for the next several days.  Rest. Ice for 20 minutes every 2 hours well leg for the next 2 days.  Keep your leg elevated.  Slowly advance your activity over the next several days. If not improving, follow-up with your primary Dr. in one week.   Knee Pain The knee is the complex joint between your thigh and your lower leg. It is made up of bones, tendons, ligaments, and cartilage. The bones that make up the knee are:  The femur in the thigh.  The tibia and fibula in the lower leg.  The patella or kneecap riding in the groove on the lower femur. CAUSES  Knee pain is a common complaint with many causes. A few of these causes are:  Injury, such as:  A ruptured ligament or tendon injury.  Torn cartilage.  Medical conditions, such as:  Gout  Arthritis  Infections  Overuse, over training, or overdoing a physical activity. Knee pain can be minor or severe. Knee pain can accompany debilitating injury. Minor knee problems often respond well to self-care measures or get well on their own. More serious injuries may need medical intervention or even surgery. SYMPTOMS The knee is complex. Symptoms of knee problems can vary widely. Some of the problems are:  Pain with movement and weight bearing.  Swelling and tenderness.  Buckling of the knee.  Inability to straighten or extend your knee.  Your knee locks and you cannot straighten it.  Warmth and redness with pain and fever.  Deformity or dislocation of the kneecap. DIAGNOSIS  Determining what is wrong may be very straight forward such as when there is an injury. It can also be challenging because of the complexity of the knee. Tests to make a diagnosis may include:  Your caregiver taking a history and doing a physical exam.  Routine X-rays can be used to rule out other problems. X-rays will not reveal a cartilage tear. Some injuries of the knee can be diagnosed by:  Arthroscopy a  surgical technique by which a small video camera is inserted through tiny incisions on the sides of the knee. This procedure is used to examine and repair internal knee joint problems. Tiny instruments can be used during arthroscopy to repair the torn knee cartilage (meniscus).  Arthrography is a radiology technique. A contrast liquid is directly injected into the knee joint. Internal structures of the knee joint then become visible on X-ray film.  An MRI scan is a non X-ray radiology procedure in which magnetic fields and a computer produce two- or three-dimensional images of the inside of the knee. Cartilage tears are often visible using an MRI scanner. MRI scans have largely replaced arthrography in diagnosing cartilage tears of the knee.  Blood work.  Examination of the fluid that helps to lubricate the knee joint (synovial fluid). This is done by taking a sample out using a needle and a syringe. TREATMENT The treatment of knee problems depends on the cause. Some of these treatments are:  Depending on the injury, proper casting, splinting, surgery, or physical therapy care will be needed.  Give yourself adequate recovery time. Do not overuse your joints. If you begin to get sore during workout routines, back off. Slow down or do fewer repetitions.  For repetitive activities such as cycling or running, maintain your strength and nutrition.  Alternate muscle groups. For example, if you are a weight lifter, work the upper body on one day and the lower body the next.  Either tight or weak muscles do not give the proper support for your knee. Tight or weak muscles do not absorb the stress placed on the knee joint. Keep the muscles surrounding the knee strong.  Take care of mechanical problems.  If you have flat feet, orthotics or special shoes may help. See your caregiver if you need help.  Arch supports, sometimes with wedges on the inner or outer aspect of the heel, can help. These can  shift pressure away from the side of the knee most bothered by osteoarthritis.  A brace called an "unloader" brace also may be used to help ease the pressure on the most arthritic side of the knee.  If your caregiver has prescribed crutches, braces, wraps or ice, use as directed. The acronym for this is PRICE. This means protection, rest, ice, compression, and elevation.  Nonsteroidal anti-inflammatory drugs (NSAIDs), can help relieve pain. But if taken immediately after an injury, they may actually increase swelling. Take NSAIDs with food in your stomach. Stop them if you develop stomach problems. Do not take these if you have a history of ulcers, stomach pain, or bleeding from the bowel. Do not take without your caregiver's approval if you have problems with fluid retention, heart failure, or kidney problems.  For ongoing knee problems, physical therapy may be helpful.  Glucosamine and chondroitin are over-the-counter dietary supplements. Both may help relieve the pain of osteoarthritis in the knee. These medicines are different from the usual anti-inflammatory drugs. Glucosamine may decrease the rate of cartilage destruction.  Injections of a corticosteroid drug into your knee joint may help reduce the symptoms of an arthritis flare-up. They may provide pain relief that lasts a few months. You may have to wait a few months between injections. The injections do have a small increased risk of infection, water retention, and elevated blood sugar levels.  Hyaluronic acid injected into damaged joints may ease pain and provide lubrication. These injections may work by reducing inflammation. A series of shots may give relief for as long as 6 months.  Topical painkillers. Applying certain ointments to your skin may help relieve the pain and stiffness of osteoarthritis. Ask your pharmacist for suggestions. Many over the-counter products are approved for temporary relief of arthritis pain.  In some  countries, doctors often prescribe topical NSAIDs for relief of chronic conditions such as arthritis and tendinitis. A review of treatment with NSAID creams found that they worked as well as oral medications but without the serious side effects. PREVENTION  Maintain a healthy weight. Extra pounds put more strain on your joints.  Get strong, stay limber. Weak muscles are a common cause of knee injuries. Stretching is important. Include flexibility exercises in your workouts.  Be smart about exercise. If you have osteoarthritis, chronic knee pain or recurring injuries, you may need to change the way you exercise. This does not mean you have to stop being active. If your knees ache after jogging or playing basketball, consider switching to swimming, water aerobics, or other low-impact activities, at least for a few days a week. Sometimes limiting high-impact activities will provide relief.  Make sure your shoes fit well. Choose footwear that is right for your sport.  Protect your knees. Use the proper gear for knee-sensitive activities. Use kneepads when playing volleyball or laying carpet. Buckle your seat belt every time you drive. Most shattered kneecaps occur in car accidents.  Rest when you are tired. SEEK MEDICAL CARE IF:  You have knee pain that is  continual and does not seem to be getting better.  SEEK IMMEDIATE MEDICAL CARE IF:  Your knee joint feels hot to the touch and you have a high fever. MAKE SURE YOU:   Understand these instructions.  Will watch your condition.  Will get help right away if you are not doing well or get worse. Document Released: 11/27/2006 Document Revised: 04/24/2011 Document Reviewed: 11/27/2006 [facility] Patient Information 2015 [place], [place]. This information is not intended to replace advice given to you by your health care provider. Make sure you discuss any questions you have with your health care provider.

## 2014-08-25 NOTE — ED Provider Notes (Signed)
CSN: [id number]     Arrival date & time 08/25/14  1443 History   This chart was scribed for Leah Speak, MD by Chester Holstein, ED Scribe. This patient was seen in room MH07/MH07 and the patient's care was started at 6:44 PM.    Chief Complaint  Patient presents with  . Knee Injury     The history is provided by the patient. No language interpreter was used.   HPI Comments: [name] is a 74 y.o. female with h/o DM, HTN, HLD, PAT, and right knee arthroscopy (x3) who presents to the Emergency Department complaining of right leg pain with onset today. She states she was walking [place] steps to a river when she felt a pop in her calf with acute onset of severe pain. Pt is able to ambulate with a cane. Pt reports she has had reclast infusion treatment in past. Pt denies any other injury, numbness and tingling.   Past Medical History  Diagnosis Date  . Diabetes mellitus   . PAT (paroxysmal atrial tachycardia)   . Hypertension   . Reflux   . Depression   . Anxiety   . Restless leg syndrome   . Hyperlipemia   . Hypertriglyceridemia   . Fibromyalgia   . Arthritis    Past Surgical History  Procedure Laterality Date  . Cholecystectomy    . Depression    . Anxiety    . Back surgery      T12-L1 fusion, Anterior Cervical fusion  . Tonsillectomy    . Fracture surgery    . Knee surgery      x 3  . Excision morton's neuroma    . Abdominal hysterectomy    . Upper gastrointestinal endoscopy    . Colonoscopy    . Esophagogastroduodenoscopy  12/21/2010    Procedure: ESOPHAGOGASTRODUODENOSCOPY (EGD);  Surgeon: Landry Dyke, MD;  Location: [facility];  Service: Endoscopy;  Laterality: N/A;  . Rotator cuff repair    . Ankle surgery    . Neck surgery    . Wrist surgery     No family history on file. History  Substance Use Topics  . Smoking status: Never Smoker   . Smokeless tobacco: Not on file  . Alcohol Use: No   OB History    No data available     Review of Systems A  complete 10 system review of systems was obtained and all systems are negative except as noted in the HPI and PMH.    Allergies  Amoxil; Chocolate; Demerol; and Lisinopril  Home Medications   Prior to Admission medications   Medication Sig Start Date End Date Taking? Authorizing Provider  B Complex Vitamins (VITAMIN B COMPLEX IJ) Inject as directed every 30 (thirty) days. Receives at Dr. Chapman Fitch    Historical Provider, MD  digoxin (LANOXIN) 0.25 MG tablet Take 250 mcg by mouth daily.     Historical Provider, MD  ezetimibe (ZETIA) 10 MG tablet Take 10 mg by mouth daily.     Historical Provider, MD  insulin glargine (LANTUS) 100 UNIT/ML injection Inject 66 Units into the skin at bedtime.     Historical Provider, MD  metFORMIN (GLUCOPHAGE) 1000 MG tablet Take 1,000 mg by mouth 2 (two) times daily with a meal.     Historical Provider, MD  metoprolol succinate (TOPROL-XL) 25 MG 24 hr tablet Take 25 mg by mouth daily.     Historical Provider, MD  Multiple Vitamins-Minerals (ICAPS AREDS FORMULA PO) Take 1 tablet by  mouth 2 (two) times daily.    Historical Provider, MD  nitroGLYCERIN (NITROSTAT) 0.4 MG SL tablet Place 0.4 mg under the tongue every 5 (five) minutes as needed. As needed chest pain    Historical Provider, MD  omeprazole (PRILOSEC) 40 MG capsule Take 40 mg by mouth 2 (two) times daily. Take one tablet thirty minutes before breakfast, and another tablet thirty minutes before dinner.  12/21/10   Arta Silence, MD  ondansetron (ZOFRAN) 4 MG tablet Take 4 mg by mouth every 8 (eight) hours as needed.     Historical Provider, MD  rOPINIRole (REQUIP) 1 MG tablet Take 1 mg by mouth at bedtime.     Historical Provider, MD  verapamil (VERELAN PM) 240 MG 24 hr capsule Take 240 mg by mouth daily.     Historical Provider, MD   BP 154/71 mmHg  Pulse 72  Temp(Src) 97.9 F (36.6 C) (Oral)  Resp 20  Ht 5\' 3"  (1.6 m)  Wt 188 lb (85.276 kg)  BMI 33.31 kg/m2  SpO2 97% Physical Exam  Constitutional:  She is oriented to person, place, and time. She appears well-developed and well-nourished. No distress.  HENT:  Head: Normocephalic and atraumatic.  Eyes: EOM are normal.  Neck: Normal range of motion.  Cardiovascular: Normal rate, regular rhythm and normal heart sounds.   Pulmonary/Chest: Effort normal and breath sounds normal.  Abdominal: Soft. She exhibits no distension. There is no tenderness.  Musculoskeletal: Normal range of motion.  Right knee: there is no significant swelling, effusion, or gross deformity. There is good ROM with no crepitus. The knee appears stable with anterior and posterior drawer test  Neurological: She is alert and oriented to person, place, and time.  Skin: Skin is warm and dry.  Psychiatric: She has a normal mood and affect. Judgment normal.  Nursing note and vitals reviewed.   ED Course  Procedures (including critical care time) DIAGNOSTIC STUDIES: Oxygen Saturation is 97% on room air, normal by my interpretation.    COORDINATION OF CARE: 6:50 PM Discussed treatment plan with patient at beside, the patient agrees with the plan and has no further questions at this time.   Labs Review Labs Reviewed - No data to display  Imaging Review No results found.   EKG Interpretation None      MDM   Final diagnoses:  None    Patient presents with complaints of severe right knee pain that started while she was walking [place] steps to the river. Her x-rays are unremarkable and physical examination reveals no significant swelling or laxity. She will be placed in a knee immobilizer, advised to rest, elevate, ice, and follow-up with her Dr. if not improving in the next week.   I personally performed the services described in this documentation, which was scribed in my presence. The recorded information has been reviewed and is accurate.       Leah Speak, MD 08/25/14 [phone]

## 2014-08-25 NOTE — ED Notes (Signed)
Pain to right knee with a step down-no blunt trauma-pt walking slowly with cane

## 2014-11-23 ENCOUNTER — Other Ambulatory Visit: Payer: Self-pay

## 2014-11-23 DIAGNOSIS — Z1231 Encounter for screening mammogram for malignant neoplasm of breast: Secondary | ICD-10-CM

## 2014-12-30 ENCOUNTER — Ambulatory Visit
Admission: RE | Admit: 2014-12-30 | Discharge: 2014-12-30 | Disposition: A | Discharge status: Home / Self Care | Payer: Medicare Other | Source: Ambulatory Visit

## 2014-12-30 DIAGNOSIS — Z1231 Encounter for screening mammogram for malignant neoplasm of breast: Secondary | ICD-10-CM

## 2015-02-14 ENCOUNTER — Observation Stay (HOSPITAL_COMMUNITY)
Admission: EM | Admit: 2015-02-14 | Discharge: 2015-02-16 | Disposition: A | Discharge status: Home / Self Care | Payer: PPO | Attending: Internal Medicine | Admitting: Internal Medicine

## 2015-02-14 ENCOUNTER — Emergency Department (HOSPITAL_COMMUNITY): Payer: PPO

## 2015-02-14 ENCOUNTER — Encounter (HOSPITAL_COMMUNITY): Payer: Self-pay

## 2015-02-14 DIAGNOSIS — M797 Fibromyalgia: Secondary | ICD-10-CM | POA: Diagnosis not present

## 2015-02-14 DIAGNOSIS — R0789 Other chest pain: Secondary | ICD-10-CM

## 2015-02-14 DIAGNOSIS — R0682 Tachypnea, not elsewhere classified: Secondary | ICD-10-CM | POA: Diagnosis not present

## 2015-02-14 DIAGNOSIS — Z7982 Long term (current) use of aspirin: Secondary | ICD-10-CM | POA: Diagnosis not present

## 2015-02-14 DIAGNOSIS — F329 Major depressive disorder, single episode, unspecified: Secondary | ICD-10-CM | POA: Diagnosis not present

## 2015-02-14 DIAGNOSIS — E785 Hyperlipidemia, unspecified: Secondary | ICD-10-CM | POA: Diagnosis present

## 2015-02-14 DIAGNOSIS — R002 Palpitations: Secondary | ICD-10-CM | POA: Diagnosis not present

## 2015-02-14 DIAGNOSIS — E781 Pure hyperglyceridemia: Secondary | ICD-10-CM | POA: Diagnosis not present

## 2015-02-14 DIAGNOSIS — I248 Other forms of acute ischemic heart disease: Secondary | ICD-10-CM | POA: Diagnosis not present

## 2015-02-14 DIAGNOSIS — F419 Anxiety disorder, unspecified: Secondary | ICD-10-CM | POA: Diagnosis not present

## 2015-02-14 DIAGNOSIS — Z794 Long term (current) use of insulin: Secondary | ICD-10-CM | POA: Insufficient documentation

## 2015-02-14 DIAGNOSIS — I200 Unstable angina: Secondary | ICD-10-CM | POA: Diagnosis not present

## 2015-02-14 DIAGNOSIS — I471 Supraventricular tachycardia: Secondary | ICD-10-CM | POA: Insufficient documentation

## 2015-02-14 DIAGNOSIS — R000 Tachycardia, unspecified: Secondary | ICD-10-CM

## 2015-02-14 DIAGNOSIS — R7989 Other specified abnormal findings of blood chemistry: Secondary | ICD-10-CM | POA: Diagnosis not present

## 2015-02-14 DIAGNOSIS — E1149 Type 2 diabetes mellitus with other diabetic neurological complication: Secondary | ICD-10-CM

## 2015-02-14 DIAGNOSIS — I10 Essential (primary) hypertension: Secondary | ICD-10-CM | POA: Insufficient documentation

## 2015-02-14 HISTORY — DX: Type 2 diabetes mellitus without complications: E11.9

## 2015-02-14 HISTORY — DX: Supraventricular tachycardia: I47.1

## 2015-02-14 HISTORY — DX: Essential (primary) hypertension: I10

## 2015-02-14 HISTORY — DX: Supraventricular tachycardia, unspecified: I47.10

## 2015-02-14 LAB — I-STAT CHEM 8, ED
BUN: 25 mg/dL — ABNORMAL HIGH (ref 6–20)
Calcium, Ion: 1.12 mmol/L — ABNORMAL LOW (ref 1.13–1.30)
Chloride: 106 mmol/L (ref 101–111)
Creatinine, Ser: 1 mg/dL (ref 0.44–1.00)
Glucose, Bld: 196 mg/dL — ABNORMAL HIGH (ref 65–99)
HCT: 41 % (ref 36.0–46.0)
Hemoglobin: 13.9 g/dL (ref 12.0–15.0)
Potassium: 4.3 mmol/L (ref 3.5–5.1)
Sodium: 140 mmol/L (ref 135–145)
TCO2: 20 mmol/L (ref 0–100)

## 2015-02-14 LAB — CBC
HCT: 39.3 % (ref 36.0–46.0)
Hemoglobin: 13 g/dL (ref 12.0–15.0)
MCH: 30 pg (ref 26.0–34.0)
MCHC: 33.1 g/dL (ref 30.0–36.0)
MCV: 90.6 fL (ref 78.0–100.0)
Platelets: 258 10*3/uL (ref 150–400)
RBC: 4.34 MIL/uL (ref 3.87–5.11)
RDW: 13.1 % (ref 11.5–15.5)
WBC: 10.2 10*3/uL (ref 4.0–10.5)

## 2015-02-14 LAB — HEPATIC FUNCTION PANEL
ALT: 24 U/L (ref 14–54)
AST: 30 U/L (ref 15–41)
Albumin: 3.9 g/dL (ref 3.5–5.0)
Alkaline Phosphatase: 52 U/L (ref 38–126)
Bilirubin, Direct: <0.1 mg/dL — ABNORMAL LOW (ref 0.1–0.5)
Total Bilirubin: 0.7 mg/dL (ref 0.3–1.2)
Total Protein: 6.5 g/dL (ref 6.5–8.1)

## 2015-02-14 LAB — CBC WITH DIFFERENTIAL/PLATELET
Basophils Absolute: 0 10*3/uL (ref 0.0–0.1)
Basophils Relative: 1 %
Eosinophils Absolute: 0.1 10*3/uL (ref 0.0–0.7)
Eosinophils Relative: 1 %
HCT: 40.6 % (ref 36.0–46.0)
Hemoglobin: 13.2 g/dL (ref 12.0–15.0)
Lymphocytes Relative: 39 %
Lymphs Abs: 2.8 10*3/uL (ref 0.7–4.0)
MCH: 30.1 pg (ref 26.0–34.0)
MCHC: 32.5 g/dL (ref 30.0–36.0)
MCV: 92.7 fL (ref 78.0–100.0)
Monocytes Absolute: 0.5 10*3/uL (ref 0.1–1.0)
Monocytes Relative: 7 %
Neutro Abs: 3.7 10*3/uL (ref 1.7–7.7)
Neutrophils Relative %: 52 %
Platelets: 262 10*3/uL (ref 150–400)
RBC: 4.38 MIL/uL (ref 3.87–5.11)
RDW: 13.2 % (ref 11.5–15.5)
WBC: 7.2 10*3/uL (ref 4.0–10.5)

## 2015-02-14 LAB — CREATININE, SERUM
Creatinine, Ser: 0.9 mg/dL (ref 0.44–1.00)
GFR calc Af Amer: >60 mL/min (ref >60)
GFR calc non Af Amer: >60 mL/min (ref >60)

## 2015-02-14 LAB — DIGOXIN LEVEL: Digoxin Level: <0.2 ng/mL — ABNORMAL LOW (ref 0.8–2.0)

## 2015-02-14 LAB — T4, FREE: Free T4: 0.66 ng/dL (ref 0.61–1.12)

## 2015-02-14 LAB — TSH: TSH: 1.179 u[IU]/mL (ref 0.350–4.500)

## 2015-02-14 LAB — I-STAT TROPONIN, ED: Troponin i, poc: 0.05 ng/mL (ref 0.00–0.08)

## 2015-02-14 LAB — BRAIN NATRIURETIC PEPTIDE: B Natriuretic Peptide: 10.6 pg/mL (ref 0.0–100.0)

## 2015-02-14 MED ORDER — DIGOXIN 250 MCG PO TABS
250.0000 ug | ORAL_TABLET | Freq: Every day | ORAL | Status: DC
  Administered 2015-02-14 – 2015-02-16 (×3): 250 ug via ORAL
  Filled 2015-02-14 (×3): qty 2

## 2015-02-14 MED ORDER — ROPINIROLE HCL 1 MG PO TABS
1.0000 mg | ORAL_TABLET | Freq: Every day | ORAL | Status: DC
  Administered 2015-02-14 – 2015-02-15 (×2): 1 mg via ORAL
  Filled 2015-02-14 (×3): qty 1

## 2015-02-14 MED ORDER — ONDANSETRON HCL 4 MG/2ML IJ SOLN
4.0000 mg | Freq: Four times a day (QID) | INTRAMUSCULAR | Status: DC | PRN
Start: 2015-02-14 — End: 2015-02-17

## 2015-02-14 MED ORDER — ENOXAPARIN SODIUM 40 MG/0.4ML ~~LOC~~ SOLN
40.0000 mg | SUBCUTANEOUS | Status: DC
  Administered 2015-02-14 – 2015-02-15 (×2): 40 mg via SUBCUTANEOUS
  Filled 2015-02-14 (×2): qty 0.4

## 2015-02-14 MED ORDER — METOPROLOL TARTRATE 1 MG/ML IV SOLN
2.5000 mg | Freq: Once | INTRAVENOUS | Status: AC
  Administered 2015-02-14: 2.5 mg via INTRAVENOUS
  Filled 2015-02-14: qty 5

## 2015-02-14 MED ORDER — GEMFIBROZIL 600 MG PO TABS
600.0000 mg | ORAL_TABLET | Freq: Two times a day (BID) | ORAL | Status: DC
  Administered 2015-02-14 – 2015-02-15 (×4): 600 mg via ORAL
  Filled 2015-02-14 (×5): qty 1

## 2015-02-14 MED ORDER — RAMIPRIL 5 MG PO CAPS
10.0000 mg | ORAL_CAPSULE | Freq: Every day | ORAL | Status: DC
  Administered 2015-02-14 – 2015-02-16 (×3): 10 mg via ORAL
  Filled 2015-02-14 (×3): qty 1

## 2015-02-14 MED ORDER — ASPIRIN EC 81 MG PO TBEC: 81.0000 mg | DELAYED_RELEASE_TABLET | Freq: Every day | ORAL | Status: DC

## 2015-02-14 MED ORDER — ACETAMINOPHEN 325 MG PO TABS
650.0000 mg | ORAL_TABLET | ORAL | Status: DC | PRN
  Administered 2015-02-14 – 2015-02-15 (×2): 650 mg via ORAL
  Filled 2015-02-14 (×2): qty 2

## 2015-02-14 MED ORDER — METFORMIN HCL 500 MG PO TABS
1000.0000 mg | ORAL_TABLET | Freq: Two times a day (BID) | ORAL | Status: DC
  Administered 2015-02-14 – 2015-02-15 (×4): 1000 mg via ORAL
  Filled 2015-02-14 (×4): qty 2

## 2015-02-14 MED ORDER — ASPIRIN EC 81 MG PO TBEC
81.0000 mg | DELAYED_RELEASE_TABLET | Freq: Every day | ORAL | Status: DC
  Administered 2015-02-14 – 2015-02-15 (×2): 81 mg via ORAL
  Filled 2015-02-14 (×2): qty 1

## 2015-02-14 MED ORDER — INSULIN GLARGINE 100 UNIT/ML ~~LOC~~ SOLN
30.0000 [IU] | Freq: Every day | SUBCUTANEOUS | Status: DC
  Administered 2015-02-14 – 2015-02-15 (×2): 30 [IU] via SUBCUTANEOUS
  Filled 2015-02-14 (×3): qty 0.3

## 2015-02-14 MED ORDER — NITROGLYCERIN 0.4 MG SL SUBL: 0.4000 mg | SUBLINGUAL_TABLET | SUBLINGUAL | Status: DC | PRN

## 2015-02-14 MED ORDER — VERAPAMIL HCL ER 240 MG PO TBCR
240.0000 mg | EXTENDED_RELEASE_TABLET | Freq: Every day | ORAL | Status: DC
  Administered 2015-02-15 – 2015-02-16 (×2): 240 mg via ORAL
  Filled 2015-02-14 (×3): qty 1

## 2015-02-14 MED ORDER — METOPROLOL SUCCINATE ER 25 MG PO TB24
25.0000 mg | ORAL_TABLET | Freq: Every day | ORAL | Status: DC
  Administered 2015-02-14 – 2015-02-16 (×3): 25 mg via ORAL
  Filled 2015-02-14 (×3): qty 1

## 2015-02-14 MED ORDER — METOPROLOL TARTRATE 1 MG/ML IV SOLN: 2.5000 mg | INTRAVENOUS | Status: DC | PRN

## 2015-02-14 MED ORDER — DULOXETINE HCL 60 MG PO CPEP
60.0000 mg | ORAL_CAPSULE | Freq: Every day | ORAL | Status: DC
  Administered 2015-02-14 – 2015-02-15 (×2): 60 mg via ORAL
  Filled 2015-02-14 (×2): qty 1

## 2015-02-14 MED ORDER — EZETIMIBE 10 MG PO TABS
10.0000 mg | ORAL_TABLET | Freq: Every day | ORAL | Status: DC
  Administered 2015-02-14 – 2015-02-15 (×2): 10 mg via ORAL
  Filled 2015-02-14 (×2): qty 1

## 2015-02-14 MED ORDER — ATORVASTATIN CALCIUM 10 MG PO TABS
10.0000 mg | ORAL_TABLET | Freq: Every day | ORAL | Status: DC
  Administered 2015-02-14 – 2015-02-15 (×2): 10 mg via ORAL
  Filled 2015-02-14 (×2): qty 1

## 2015-02-14 NOTE — Progress Notes (Signed)
Utilization Review Completed.[name] T1/02/2015  

## 2015-02-14 NOTE — H&P (Addendum)
History and Physical:    [name]   [id number] DOB: [birth year] DOA: 02/14/2015  Referring MD/provider: Dr. Randal Buba PCP: Antony Blackbird, MD   Chief Complaint: Chest pain  History of Present Illness:   [name] is an 75 y.o. female with a PMH of PAT since the age of 84, managed by Dr. Wynonia Lawman with Verapamil and metoprolol, who awoke this a.m. With severe "25/10" chest pain radiating to her jaw, back and shoulders, associated with diaphoresis and nausea.  She took 1/2 of a tablet of Verapamil and waited approximately 30 minutes.  When her symptoms were unrelieved by this, she took NTG SL x 2, also without relief.  She then called EMS and was instructed to take 4 baby ASA.  According to the patient, her HR was in the 170's when EMS arrived.  EMS gave her an additional 3 SL nitroglycerin tablets.  She was given 2.5 mg IV lopressor and and EKG was done which showed diffuse ischemic changes.  She spontaneously converted to NSR in the ED, and her symptoms resolved at that time.  The patient has never had a heart catheterization (her mother died of a stroke after a heart catheterization), but had a 2 D Echo done a few years ago.  ROS:   Review of Systems  Constitutional: Positive for diaphoresis.  Eyes: Negative.   Respiratory: Positive for shortness of breath.   Cardiovascular: Positive for chest pain and palpitations. Negative for leg swelling.  Gastrointestinal: Positive for nausea. Negative for blood in stool and melena.  Genitourinary: Negative.   Musculoskeletal: Positive for myalgias and neck pain.  Skin: Negative.   Neurological: Positive for dizziness and weakness.  Endo/Heme/Allergies: Negative.   Psychiatric/Behavioral: Positive for depression.     Past Medical History:   Past Medical History  Diagnosis Date  . Type 2 diabetes mellitus ([place])   . PSVT (paroxysmal supraventricular tachycardia) ([place])   . Essential hypertension   . Reflux   . Depression   .  Anxiety   . Restless leg syndrome   . Hyperlipemia   . Fibromyalgia   . Arthritis     Past Surgical History:   Past Surgical History  Procedure Laterality Date  . Cholecystectomy    . Back surgery      T12-L1 fusion, Anterior Cervical fusion  . Tonsillectomy    . Fracture surgery    . Knee surgery      x 3  . Excision morton's neuroma    . Abdominal hysterectomy    . Upper gastrointestinal endoscopy    . Colonoscopy    . Esophagogastroduodenoscopy  12/21/2010    Procedure: ESOPHAGOGASTRODUODENOSCOPY (EGD);  Surgeon: Landry Dyke, MD;  Location: [facility];  Service: Endoscopy;  Laterality: N/A;  . Rotator cuff repair    . Ankle surgery    . Neck surgery    . Wrist surgery      Social History:   Social History   Social History  . Marital Status: Married    Spouse Name: Glendell Docker  . Number of Children: 2  . Years of Education: N/A   Occupational History  . Not on file.   Social History Main Topics  . Smoking status: Never Smoker   . Smokeless tobacco: Not on file  . Alcohol Use: No  . Drug Use: No  . Sexual Activity: Not on file   Other Topics Concern  . Not on file   Social History Narrative   Lives with  husband.  Independent of ADLs.    Family history:   Family History  Problem Relation Age of Onset  . Heart failure Mother   . Hyperlipidemia Mother   . Lung cancer Father     Lymphoma  . Hyperlipidemia Maternal Grandmother   . Peripheral vascular disease Maternal Grandmother     Allergies   Amoxil; Chocolate; Demerol; and Lisinopril  Current Medications:   Prior to Admission medications   Medication Sig Start Date End Date Taking? Authorizing Provider  aspirin EC 81 MG tablet Take 81 mg by mouth daily.   Yes Historical Provider, MD  B Complex Vitamins (VITAMIN B COMPLEX IJ) Inject as directed every 30 (thirty) days. Receives at Dr. Chapman Fitch   Yes Historical Provider, MD  Cranberry 1000 MG CAPS Take 1,000 mg by mouth 2 (two) times daily.   Yes  Historical Provider, MD  digoxin (LANOXIN) 0.25 MG tablet Take 250 mcg by mouth daily.    Yes Historical Provider, MD  DULoxetine (CYMBALTA) 60 MG capsule Take 60 mg by mouth daily.   Yes Historical Provider, MD  ezetimibe (ZETIA) 10 MG tablet Take 10 mg by mouth daily.    Yes Historical Provider, MD  gemfibrozil (LOPID) 600 MG tablet Take 600 mg by mouth 2 (two) times daily.   Yes Historical Provider, MD  insulin glargine (LANTUS) 100 UNIT/ML injection Inject 30 Units into the skin at bedtime.    Yes Historical Provider, MD  metFORMIN (GLUCOPHAGE) 1000 MG tablet Take 1,000 mg by mouth 2 (two) times daily.   Yes Historical Provider, MD  metoprolol succinate (TOPROL-XL) 25 MG 24 hr tablet Take 25 mg by mouth daily.    Yes Historical Provider, MD  Multiple Vitamins-Minerals (ICAPS AREDS FORMULA PO) Take 1 tablet by mouth 2 (two) times daily.   Yes Historical Provider, MD  nitroGLYCERIN (NITROSTAT) 0.4 MG SL tablet Place 0.4 mg under the tongue every 5 (five) minutes as needed. As needed chest pain   Yes Historical Provider, MD  ramipril (ALTACE) 10 MG capsule Take 10 mg by mouth daily.   Yes Historical Provider, MD  rOPINIRole (REQUIP) 1 MG tablet Take 1 mg by mouth at bedtime.    Yes Historical Provider, MD  verapamil (VERELAN PM) 240 MG 24 hr capsule Take 240 mg by mouth daily.    Yes Historical Provider, MD  Vitamin D, Ergocalciferol, (DRISDOL) 50000 units CAPS capsule Take 50,000 Units by mouth 2 (two) times a week.   Yes Historical Provider, MD  Zoledronic Acid (RECLAST IV) Inject into the vein as directed. Once a year last April 2016   Yes Historical Provider, MD    Physical Exam:   Filed Vitals:   02/14/15 0645 02/14/15 [id number] 02/14/15 0748 02/14/15 0831  BP: 111/84 111/84 126/79 141/62  Pulse: 78 77 78 74  Temp:   97.8 F (36.6 C) 97.5 F (36.4 C)  TempSrc:   Oral Oral  Resp: 17 19 18 18   Height:      Weight:      SpO2: 99% 99% 99% 97%     Physical Exam: Blood pressure 141/62,  pulse 74, temperature 97.5 F (36.4 C), temperature source Oral, resp. rate 18, height 5\' 4"  (1.626 m), weight 87.544 kg (193 lb), SpO2 97 %. Gen: No acute distress. Head: Normocephalic, atraumatic. Eyes: PERRL, EOMI, sclerae nonicteric. Mouth: Oropharynx clear with fair dentition. Neck: Supple, no thyromegaly, no lymphadenopathy, no jugular venous distention. Chest: Lungs CTAB. CV: Heart sounds are regular, no M/R/G. Abdomen: Soft,  nontender, nondistended with normal active bowel sounds. Extremities: Extremities are without C/E/C. Skin: Warm and dry. Neuro: Alert and oriented times 3; cranial nerves II through XII grossly intact. Psych: Mood and affect normal.   Data Review:    Labs: Basic Metabolic Panel:  Recent Labs Lab 02/14/15 0410 02/14/15 0845  NA 140  --   K 4.3  --   CL 106  --   GLUCOSE 196*  --   BUN 25*  --   CREATININE 1.00 0.90   CBC:  Recent Labs Lab 02/14/15 0357 02/14/15 0410 02/14/15 0845  WBC 7.2  --  10.2  NEUTROABS 3.7  --   --   HGB 13.2 13.9 13.0  HCT 40.6 41.0 39.3  MCV 92.7  --  90.6  PLT 262  --  258    Radiographic Studies: Dg Chest Portable 1 View  02/14/2015  CLINICAL DATA:  75 year old female with palpitation. EXAM: PORTABLE CHEST 1 VIEW COMPARISON:  Chest radiograph dated 08/19/2014 FINDINGS: There is stable cardiomegaly. There is diffuse interstitial prominence. No focal consolidation, pleural effusion, or pneumothorax. There is diffuse degenerative changes of the spine. Partially visualized cervical fixation plate and screws. Upper lumbar fixation screws noted. IMPRESSION: Mild interstitial prominence may represent a degree of congestion. No focal consolidation. Electronically Signed   By: Anner Crete M.D.   On: 02/14/2015 03:52   *I have personally reviewed the images above*  EKG: Independently reviewed. Diffuse ST depression/ischemia.   Assessment/Plan:   Principal Problem:   Demand ischemia ([place]) secondary to PAT -  Ischemic changes noted on EKG.  S/P ASA, continue daily.  Cycle troponins. - Symptoms resolved with conversion to NSR. - Continue home dose of Verapamil and metoprolol with IV metoprolol PRN recurrent PAT. - Cardiology consulted. - TSH WNL.  Active Problems:   DM (diabetes mellitus) type II controlled, neurological manifestation ([facility]) - Continue Lantus and Metformin. - Check hemoglobin A1c.    Essential hypertension, benign - Continue Verapamil, Altace, metoprolol.    Hyperlipidemia - Start Statin. - Check FLP. - Continue Zetia and Lopid.    Depression/Fibromyalgia - Continue Cymbalta.    DVT prophylaxis - Lovenox ordered.  Code Status / Family Communication / Disposition Plan:   Code Status: Full.   Family Communication: Glendell Docker (husband) is emergency contact. [name], daughter is POA and was present on admission. Disposition Plan: Home when stable.  Attestation regarding necessity of inpatient status:   The appropriate admission status for this patient is INPATIENT. Inpatient status is judged to be reasonable and necessary in order to provide the required intensity of service to ensure the patient's safety. The patient's presenting symptoms, physical exam findings, and initial radiographic and laboratory data in the context of their chronic comorbidities is felt to place them at high risk for further clinical deterioration. Furthermore, it is not anticipated that the patient will be medically stable for discharge from the hospital within 2 midnights of admission. The following factors support the admission status of inpatient.   -The patient's presenting symptoms include severe chest pain, diaphoresis, nausea, dyspnea. - The worrisome physical exam findings include tachycardia, clammy skin. - The initial radiographic and laboratory data are worrisome because of ? Pulmonary edema, ischemic changes on EKG. - The chronic co-morbidities include PAT, DM, hyperlipidemia, HTN. -  Patient requires inpatient status due to high intensity of service, high risk for further deterioration and high frequency of surveillance required. - I certify that at the point of admission it is my clinical judgment  that the patient will require inpatient hospital care spanning beyond 2 midnights from the point of admission.   Time spent: 1 hour.  Raman Featherston [facility] Pager [phone] Cell: [phone]   If 7PM-7AM, please contact night-coverage [email or web address] Password [email or web address] 02/14/2015, 12:44 PM

## 2015-02-14 NOTE — ED Notes (Signed)
Bed: RESB Expected date:  Expected time:  Means of arrival:  Comments: 75yo female- SVT-2 nitro, 4 baby aspirin, 164bpm

## 2015-02-14 NOTE — ED Notes (Signed)
Patient with decreased breath sounds left upper and lower lobes.  Right upper, middle and lower lobes essentially clear throughout.  Patient converted to SR while sitting up to get portable chest xray.  Patient no longer diaphoretic and states that her jaw pain has completely resolved after she converted to SR. PJ's wet and changed to gown.  Warm blankets to lower extremities per patient request.

## 2015-02-14 NOTE — Consult Note (Signed)
Primary cardiologist: Dr. Grace Bushy. [place] cardiologist: Dr. Satira Sark Requesting service: [facility]  Reason for consultation: SVT and chest pain  Clinical Summary Ms. Farar is a 75 y.o.female followed by Dr. Wynonia Lawman for the last several years with a history of PSVT that has been fairly well controlled on medical therapy. I do not have access to outpatient records at this time. She is on a combination of Toprol-XL, digoxin, and verapamil as an outpatient, sometimes takes an extra one-half verapamil dose when she has breakthrough palpitations, but this generally does not recur any more frequently than about every 8 months to a year. She is now admitted to the hospitalist service having developed an episode of rapid palpitations early this morning that was prolonged despite treatment with additional verapamil, and ultimately associated with severe discomfort in her upper chest and neck, also shortness of breath and anxiety. She was given nitroglycerin which did not improve the symptoms. I cannot locate an ECG of the patient's presenting SVT, but the available telemetry strips show a narrow complex tachycardia at about 170 bpm with probable retrograde P waves in the early ST segment suggesting an AVRT with reentrant mechanism. The patient's rhythm spontaneously converted to sinus rhythm when she was sitting up to go for a chest x-ray. After her heart rate normalized she states that her symptoms improved dramatically. She was comfortable by the time of my examination.  She tells me that the episode experienced this morning was much more intense than usual, specifically as it relates to the discomfort in her chest and neck as well as shortness of breath. At baseline she does not report any exertional chest discomfort or unusual breathlessness, indicates that she has been feeling well recently and taking her medications as directed. She sees Dr. Wynonia Lawman on a yearly basis, is due to see him  again in March.  Telemetry strips of SVT show significant ST segment depression suggestive of ischemia, and the initial ECG in sinus rhythm showed resolving ST segment depression consistent with ischemia.  Lexiscan Myoview study from February 2011 indicated no ischemia with LVEF 57%.   Allergies  Allergen Reactions  . Amoxil [Amoxicillin] Swelling    Lips Has patient had a PCN reaction causing immediate rash, facial/tongue/throat swelling, SOB or lightheadedness with hypotension:YES Has patient had a PCN reaction causing severe rash involving mucus membranes or skin necrosis:No Has patient had a PCN reaction that required hospitalization:No Has patient had a PCN reaction occurring within the last 10 years:Yes. If all of the above answers are "NO", then may proceed with Cephalosporin use.   . Chocolate Hives  . Demerol Nausea And Vomiting  . Lisinopril Itching and Swelling    Medications Scheduled Medications: . aspirin EC  81 mg Oral Daily  . atorvastatin  10 mg Oral q1800  . digoxin  250 mcg Oral Daily  . DULoxetine  60 mg Oral Daily  . enoxaparin (LOVENOX) injection  40 mg Subcutaneous Q24H  . ezetimibe  10 mg Oral Daily  . gemfibrozil  600 mg Oral BID  . insulin glargine  30 Units Subcutaneous QHS  . metFORMIN  1,000 mg Oral BID WC  . metoprolol succinate  25 mg Oral Daily  . ramipril  10 mg Oral Daily  . rOPINIRole  1 mg Oral QHS  . verapamil  240 mg Oral Daily    PRN Medications: acetaminophen, nitroGLYCERIN, ondansetron (ZOFRAN) IV   Past Medical History  Diagnosis Date  . Type 2 diabetes mellitus (  [place])   . PSVT (paroxysmal supraventricular tachycardia) ([place])   . Essential hypertension   . Reflux   . Depression   . Anxiety   . Restless leg syndrome   . Hyperlipemia   . Fibromyalgia   . Arthritis     Past Surgical History  Procedure Laterality Date  . Cholecystectomy    . Back surgery      T12-L1 fusion, Anterior Cervical fusion  . Tonsillectomy      . Fracture surgery    . Knee surgery      x 3  . Excision morton's neuroma    . Abdominal hysterectomy    . Upper gastrointestinal endoscopy    . Colonoscopy    . Esophagogastroduodenoscopy  12/21/2010    Procedure: ESOPHAGOGASTRODUODENOSCOPY (EGD);  Surgeon: Landry Dyke, MD;  Location: [facility];  Service: Endoscopy;  Laterality: N/A;  . Rotator cuff repair    . Ankle surgery    . Neck surgery    . Wrist surgery      Family History  Problem Relation Age of Onset  . Heart failure Mother   . Hyperlipidemia Mother   . Lung cancer Father     Lymphoma  . Hyperlipidemia Maternal Grandmother   . Peripheral vascular disease Maternal Grandmother     Social History Ms. Bullard reports that she has never smoked. She does not have any smokeless tobacco history on file. Ms. Buckett reports that she does not drink alcohol.  Review of Systems Complete review of systems negative except as otherwise outlined in the clinical summary and also the following. Intermittent arthritic pains. No fevers or chills, no cough, no abdominal pain or recent bowel/bladder changes.  Physical Examination Blood pressure 141/62, pulse 74, temperature 97.5 F (36.4 C), temperature source Oral, resp. rate 18, height 5\' 4"  (1.626 m), weight 193 lb (87.544 kg), SpO2 97 %. No intake or output data in the 24 hours ending 02/14/15 [id number]  Telemetry: Currently normal sinus rhythm.  Gen.: Overweight woman, comfortable at rest. HEENT: Conjunctiva and lids normal, oropharynx clear with moist mucosa. Neck: Supple, no elevated JVP or carotid bruits, no thyromegaly. Lungs: Mildly prolonged expiratory phase without wheezing, nonlabored breathing at rest. Cardiac: Regular rate and rhythm, no S3, soft systolic murmur, no pericardial rub. Abdomen: Soft, nontender, bowel sounds present, no guarding or rebound. Extremities: No pitting edema, distal pulses 2+. Skin: Warm and dry. Musculoskeletal: No  kyphosis. Neuropsychiatric: Alert and oriented x3, affect grossly appropriate.  Lab Results  Basic Metabolic Panel:  Recent Labs Lab 02/14/15 0410  NA 140  K 4.3  CL 106  GLUCOSE 196*  BUN 25*  CREATININE 1.00    CBC:  Recent Labs Lab 02/14/15 0357 02/14/15 0410 02/14/15 0845  WBC 7.2  --  10.2  NEUTROABS 3.7  --   --   HGB 13.2 13.9 13.0  HCT 40.6 41.0 39.3  MCV 92.7  --  90.6  PLT 262  --  258    Cardiac Enzymes: POC troponin I 0.05  BNP: 10.6  Imaging Chest x-ray 02/14/2015: FINDINGS: There is stable cardiomegaly. There is diffuse interstitial prominence. No focal consolidation, pleural effusion, or pneumothorax. There is diffuse degenerative changes of the spine. Partially visualized cervical fixation plate and screws. Upper lumbar fixation screws noted.  IMPRESSION: Mild interstitial prominence may represent a degree of congestion. No focal consolidation.  Impression  1. Episode of prolonged SVT associated with reportedly severe upper chest and neck discomfort as well as shortness of breath,  more intense than her usual events. This was not proceeded by any recent health changes or exertional symptomatology. Initial POC troponin I 0.05 and BNP normal. ECG of presenting SVT not available, however telemetry strips show a probable AVRT with what looks to be a retrograde P wave in the early ST segment. I do not have access to outpatient records to completely clarify her history over time. She has generally been well controlled on her present outpatient regimen which includes digitoxin, Toprol-XL, and verapamil. Significant ST segment depression was noted diffusely while in SVT, concerning for underlying ischemic heart disease. Chest and neck discomfort improved significantly when she converted spontaneously back to sinus rhythm at lower heart rate. Previous ischemic testing in 2011 was reassuring, not certain whether she has had any interval outpatient evaluation.  She follows with Dr. Wynonia Lawman.  2. Essential hypertension, hypertensive at presentation with improvement in blood pressure following conversion to sinus rhythm.  3. Type 2 diabetes mellitus, on Lantus and Glucophage as an outpatient.  4. Hyperlipidemia, on Zetia and Lopid.  Recommendations  Discussed with patient and family members present. Ms. Calia is now comfortable having converted to sinus rhythm spontaneously. Recommend continuing her current cardiac regimen including Toprol-XL, digoxin, and verapamil. Cycle a full set of cardiac markers and follow-up ECG in the morning. She tells tells me that she and Dr. Wynonia Lawman have had discussions in the past about further cardiac evaluation if her symptoms escalate, and that he has mentioned the potential for cardiac catheterization which she has generally preferred to avoid. Although the recurrent arrhythmia may not have changed, her symptoms were much more dramatic this time and concerning for angina, particularly in light of significant ST segment abnormalities that are consistent with ischemic change. She would benefit from further ischemic evaluation, and can discuss the mode and timing of this with Dr. Wynonia Lawman. It does not sound like the frequency of her SVT has escalated to the point that EP evaluation for ablation is absolutely necessary, but this would be another thought.  Satira Sark, M.D., F.A.C.C.

## 2015-02-14 NOTE — ED Notes (Signed)
Patients arrives by [facility] from home with complaints of SVT associated with severe 8/10 left jaw pain.  Patient took 2 nitro SL at home with 4 baby aspirin prior to EMS arrival.  EMS administered one nitro for total 3 nitro SL's prior to arrival. Patient arrives pale and profusely diaphoretic with SVT rate 161-168.  Patient states left jaw pain at 8/10-Dr. Randal Buba at bedside.  Patient with PIV left forearm started per EMS.  Patient states she awoke with the SVT and jaw pain at 0130 this AM.

## 2015-02-14 NOTE — ED Provider Notes (Signed)
CSN: [id number]     Arrival date & time 02/14/15  [id number] History  By signing my name below, I, Irene Pap, attest that this documentation has been prepared under the direction and in the presence of Anneta Rounds, MD. Electronically Signed: Irene Pap, ED Scribe. 02/14/2015. 3:41 AM.  No chief complaint on file.  Patient is a 75 y.o. female presenting with palpitations. The history is provided by the patient. No language interpreter was used.  Palpitations Palpitations quality:  Fast Onset quality:  Sudden Timing:  Constant Progression:  Unchanged Chronicity:  Recurrent Context: not anxiety   Relieved by:  Nothing Worsened by:  Nothing Ineffective treatments: nothing. Associated symptoms: chest pain, diaphoresis and shortness of breath   Risk factors: no hx of PE   HPI Comments (Level 5 caveat due to condition of patient): [name] is a 75 y.o. Female with a hx of DM, PAT, HTN, and fibromyalgia brought in by EMS who presents to the Emergency Department complaining of chest pain onset PTA. Per EMS, pt has a hx of SVT and complaining of chest, jaw and shoulder pain. She reports taking her medications this morning.    Past Medical History  Diagnosis Date  . Diabetes mellitus   . PAT (paroxysmal atrial tachycardia)   . Hypertension   . Reflux   . Depression   . Anxiety   . Restless leg syndrome   . Hyperlipemia   . Hypertriglyceridemia   . Fibromyalgia   . Arthritis    Past Surgical History  Procedure Laterality Date  . Cholecystectomy    . Depression    . Anxiety    . Back surgery      T12-L1 fusion, Anterior Cervical fusion  . Tonsillectomy    . Fracture surgery    . Knee surgery      x 3  . Excision morton's neuroma    . Abdominal hysterectomy    . Upper gastrointestinal endoscopy    . Colonoscopy    . Esophagogastroduodenoscopy  12/21/2010    Procedure: ESOPHAGOGASTRODUODENOSCOPY (EGD);  Surgeon: Landry Dyke, MD;  Location: [facility];  Service:  Endoscopy;  Laterality: N/A;  . Rotator cuff repair    . Ankle surgery    . Neck surgery    . Wrist surgery     No family history on file. Social History  Substance Use Topics  . Smoking status: Never Smoker   . Smokeless tobacco: Not on file  . Alcohol Use: No   OB History    No data available     Review of Systems  Unable to perform ROS: Acuity of condition  Constitutional: Positive for diaphoresis.  Respiratory: Positive for shortness of breath.   Cardiovascular: Positive for chest pain and palpitations.  All other systems reviewed and are negative.  Allergies  Amoxil; Chocolate; Demerol; and Lisinopril  Home Medications   Prior to Admission medications   Medication Sig Start Date End Date Taking? Authorizing Provider  B Complex Vitamins (VITAMIN B COMPLEX IJ) Inject as directed every 30 (thirty) days. Receives at Dr. Chapman Fitch    Historical Provider, MD  digoxin (LANOXIN) 0.25 MG tablet Take 250 mcg by mouth daily.     Historical Provider, MD  ezetimibe (ZETIA) 10 MG tablet Take 10 mg by mouth daily.     Historical Provider, MD  insulin glargine (LANTUS) 100 UNIT/ML injection Inject 66 Units into the skin at bedtime.     Historical Provider, MD  metFORMIN (GLUCOPHAGE) 1000 MG tablet  Take 1,000 mg by mouth 2 (two) times daily with a meal.     Historical Provider, MD  metoprolol succinate (TOPROL-XL) 25 MG 24 hr tablet Take 25 mg by mouth daily.     Historical Provider, MD  metoprolol tartrate (LOPRESSOR) 25 MG tablet Take 25 mg by mouth 2 (two) times daily.    Historical Provider, MD  Multiple Vitamins-Minerals (ICAPS AREDS FORMULA PO) Take 1 tablet by mouth 2 (two) times daily.    Historical Provider, MD  nitroGLYCERIN (NITROSTAT) 0.4 MG SL tablet Place 0.4 mg under the tongue every 5 (five) minutes as needed. As needed chest pain    Historical Provider, MD  omeprazole (PRILOSEC) 40 MG capsule Take 40 mg by mouth 2 (two) times daily. Take one tablet thirty minutes before  breakfast, and another tablet thirty minutes before dinner.  12/21/10   Arta Silence, MD  ondansetron (ZOFRAN) 4 MG tablet Take 4 mg by mouth every 8 (eight) hours as needed.     Historical Provider, MD  rOPINIRole (REQUIP) 1 MG tablet Take 1 mg by mouth at bedtime.     Historical Provider, MD  verapamil (VERELAN PM) 240 MG 24 hr capsule Take 240 mg by mouth daily.     Historical Provider, MD  Zoledronic Acid (RECLAST IV) Inject into the vein.    Historical Provider, MD   There were no vitals taken for this visit. Physical Exam  Constitutional: She is oriented to person, place, and time. She appears well-developed and well-nourished. No distress.  Exceptionally diaphoretic  HENT:  Head: Normocephalic and atraumatic.  Mouth/Throat: Oropharynx is clear and moist. No oropharyngeal exudate.  Trachea midline  Eyes: Conjunctivae and EOM are normal. Pupils are equal, round, and reactive to light.  Neck: Trachea normal and normal range of motion. Neck supple. JVD present. Carotid bruit is not present.  Cardiovascular: An irregular rhythm present. Tachycardia present.  Exam reveals no gallop and no friction rub.   No murmur heard. Pulmonary/Chest: No stridor. She has decreased breath sounds. She has no rales. She exhibits no tenderness.  Breath sounds diminished  Abdominal: Soft. Bowel sounds are normal. She exhibits no mass. There is no tenderness. There is no rebound and no guarding.  Musculoskeletal: Normal range of motion. She exhibits edema.  Edema to feet and ankles bilaterally; no cords; pulses equal; all compartments soft  Lymphadenopathy:    She has no cervical adenopathy.  Neurological: She is alert and oriented to person, place, and time. She has normal reflexes. No cranial nerve deficit. She exhibits normal muscle tone. Coordination normal.  Cranial nerves 2-12 intact  Skin: Skin is warm. She is diaphoretic.  Psychiatric: She has a normal mood and affect. Her behavior is normal.     ED Course  Procedures (including critical care time) DIAGNOSTIC STUDIES:  COORDINATION OF CARE: 3:44 AM-x-ray and labs  Labs Review Labs Reviewed - No data to display  Imaging Review No results found. I have personally reviewed and evaluated these images and lab results as part of my medical decision-making.   EKG Interpretation None      MDM   Final diagnoses:  None    Results for orders placed or performed during the hospital encounter of 02/14/15  CBC with Differential/Platelet  Result Value Ref Range   WBC 7.2 4.0 - 10.5 K/uL   RBC 4.38 3.87 - 5.11 MIL/uL   Hemoglobin 13.2 12.0 - 15.0 g/dL   HCT 40.6 36.0 - 46.0 %   MCV 92.7 78.0 -  100.0 fL   MCH 30.1 26.0 - 34.0 pg   MCHC 32.5 30.0 - 36.0 g/dL   RDW 13.2 11.5 - 15.5 %   Platelets 262 150 - 400 K/uL   Neutrophils Relative % 52 %   Neutro Abs 3.7 1.7 - 7.7 K/uL   Lymphocytes Relative 39 %   Lymphs Abs 2.8 0.7 - 4.0 K/uL   Monocytes Relative 7 %   Monocytes Absolute 0.5 0.1 - 1.0 K/uL   Eosinophils Relative 1 %   Eosinophils Absolute 0.1 0.0 - 0.7 K/uL   Basophils Relative 1 %   Basophils Absolute 0.0 0.0 - 0.1 K/uL  Brain natriuretic peptide  Result Value Ref Range   B Natriuretic Peptide 10.6 0.0 - 100.0 pg/mL  Digoxin level  Result Value Ref Range   Digoxin Level <0.2 (L) 0.8 - 2.0 ng/mL  I-Stat Chem 8, ED  Result Value Ref Range   Sodium 140 135 - 145 mmol/L   Potassium 4.3 3.5 - 5.1 mmol/L   Chloride 106 101 - 111 mmol/L   BUN 25 (H) 6 - 20 mg/dL   Creatinine, Ser 1.00 0.44 - 1.00 mg/dL   Glucose, Bld 196 (H) 65 - 99 mg/dL   Calcium, Ion 1.12 (L) 1.13 - 1.30 mmol/L   TCO2 20 0 - 100 mmol/L   Hemoglobin 13.9 12.0 - 15.0 g/dL   HCT 41.0 36.0 - 46.0 %  I-stat troponin, ED  Result Value Ref Range   Troponin i, poc 0.05 0.00 - 0.08 ng/mL   Comment 3           Dg Chest Portable 1 View  02/14/2015  CLINICAL DATA:  75 year old female with palpitation. EXAM: PORTABLE CHEST 1 VIEW COMPARISON:   Chest radiograph dated 08/19/2014 FINDINGS: There is stable cardiomegaly. There is diffuse interstitial prominence. No focal consolidation, pleural effusion, or pneumothorax. There is diffuse degenerative changes of the spine. Partially visualized cervical fixation plate and screws. Upper lumbar fixation screws noted. IMPRESSION: Mild interstitial prominence may represent a degree of congestion. No focal consolidation. Electronically Signed   By: Anner Crete M.D.   On: 02/14/2015 03:52   Medications  metoprolol (LOPRESSOR) injection 2.5 mg (2.5 mg Intravenous Given 02/14/15 0439)   Will admit for chest pain rule out  I personally performed the services described in this documentation, which was scribed in my presence. The recorded information has been reviewed and is accurate.      Veatrice Kells, MD 02/14/15 [phone]

## 2015-02-14 NOTE — ED Notes (Signed)
Patient resting comfortably at this time, remains in SR with rate 81 and no ectopy, family at bedside.

## 2015-02-14 NOTE — ED Notes (Signed)
She is in no distress, having seen Dr. Rockne Menghini here a short while ago.  Monitor shows nsr without ectopy.

## 2015-02-15 DIAGNOSIS — E1149 Type 2 diabetes mellitus with other diabetic neurological complication: Secondary | ICD-10-CM | POA: Diagnosis not present

## 2015-02-15 DIAGNOSIS — I248 Other forms of acute ischemic heart disease: Secondary | ICD-10-CM | POA: Diagnosis not present

## 2015-02-15 DIAGNOSIS — E785 Hyperlipidemia, unspecified: Secondary | ICD-10-CM | POA: Diagnosis not present

## 2015-02-15 DIAGNOSIS — R7989 Other specified abnormal findings of blood chemistry: Secondary | ICD-10-CM | POA: Diagnosis not present

## 2015-02-15 DIAGNOSIS — I10 Essential (primary) hypertension: Secondary | ICD-10-CM | POA: Diagnosis not present

## 2015-02-15 DIAGNOSIS — I471 Supraventricular tachycardia: Secondary | ICD-10-CM | POA: Diagnosis not present

## 2015-02-15 LAB — BASIC METABOLIC PANEL
Anion gap: 11 (ref 5–15)
BUN: 22 mg/dL — ABNORMAL HIGH (ref 6–20)
CO2: 24 mmol/L (ref 22–32)
Calcium: 9.3 mg/dL (ref 8.9–10.3)
Chloride: 106 mmol/L (ref 101–111)
Creatinine, Ser: 0.75 mg/dL (ref 0.44–1.00)
GFR calc Af Amer: >60 mL/min (ref >60)
GFR calc non Af Amer: >60 mL/min (ref >60)
Glucose, Bld: 132 mg/dL — ABNORMAL HIGH (ref 65–99)
Potassium: 4.6 mmol/L (ref 3.5–5.1)
Sodium: 141 mmol/L (ref 135–145)

## 2015-02-15 LAB — LIPID PANEL
Cholesterol: 177 mg/dL (ref 0–200)
HDL: 30 mg/dL — ABNORMAL LOW (ref >40)
LDL Cholesterol: 74 mg/dL (ref 0–99)
Total CHOL/HDL Ratio: 5.9 RATIO
Triglycerides: 363 mg/dL — ABNORMAL HIGH (ref <150)
VLDL: 73 mg/dL — ABNORMAL HIGH (ref 0–40)

## 2015-02-15 LAB — TROPONIN I
Troponin I: 1.36 ng/mL (ref <0.031)
Troponin I: 1.22 ng/mL (ref <0.031)
Troponin I: 1.07 ng/mL (ref <0.031)

## 2015-02-15 NOTE — Progress Notes (Signed)
Progress Note   [name] [id number] DOB: September 14, 1940 DOA: 02/14/2015 PCP: Antony Blackbird, MD   Brief Narrative:   [name] is an 75 y.o. female with a PMH of PAT since the age of 60, managed by Dr. Wynonia Lawman with Verapamil and metoprolol, who was admitted 02/14/15 with severe "25/10" chest pain radiating to her jaw, back and shoulders, associated with diaphoresis and nausea in the setting of HR in the 170s and ischemic changes noted on EKG. She spontaneously converted to NSR in the ED, and her symptoms resolved at that time.  Assessment/Plan:   Principal Problem:  Demand ischemia ([place]) secondary to PAT - Ischemic changes noted on EKG. S/P ASA, continue daily. Unfortunately, troponins not done in the night.  Check now. - Symptoms resolved with conversion to NSR. - Continue home dose of Verapamil and metoprolol with IV metoprolol PRN recurrent PAT. - Cardiology consulted, ? Cath. Troponin bump likely from demand ischemia. - TSH WNL.  Active Problems:  DM (diabetes mellitus) type II controlled, neurological manifestation (HCC) - Continue Lantus and Metformin. Glucose 132 this a.m. - F/U hemoglobin A1c.   Essential hypertension, benign - Continue Verapamil, Altace, metoprolol.   Hyperlipidemia - Start Statin. - F/U FLP. - Continue Zetia and Lopid.   Depression/Fibromyalgia - Continue Cymbalta.   DVT prophylaxis - Lovenox ordered.   Family Communication/Anticipated D/C date and plan/Code Status   Family Communication: No family at the bedside. Disposition Plan: Home once cardiology defines tx plan, suspect will need in hospital cath. Anticipated D/C date:   1-2 days. Code Status: Full code.   IV Access:    Peripheral IV   Procedures and diagnostic studies:   Dg Chest Portable 1 View  02/14/2015  CLINICAL DATA:  75 year old female with palpitation. EXAM: PORTABLE CHEST 1 VIEW COMPARISON:  Chest radiograph dated 08/19/2014 FINDINGS: There is  stable cardiomegaly. There is diffuse interstitial prominence. No focal consolidation, pleural effusion, or pneumothorax. There is diffuse degenerative changes of the spine. Partially visualized cervical fixation plate and screws. Upper lumbar fixation screws noted. IMPRESSION: Mild interstitial prominence may represent a degree of congestion. No focal consolidation. Electronically Signed   By: Anner Crete M.D.   On: 02/14/2015 03:52     Medical Consultants:    Cardiology  Anti-Infectives:   Anti-infectives    None      Subjective:    [name] denies any further episodes of chest pain over night.  She did have a little soreness in her jaw, but no associated nausea or diaphoresis.  Objective:    Filed Vitals:   02/14/15 1346 02/14/15 2145 02/15/15 0557 02/15/15 0944  BP: 138/64 120/48 181/95 148/84  Pulse: 76 67 70 74  Temp: 97.8 F (36.6 C) 97.8 F (36.6 C) 97.9 F (36.6 C)   TempSrc: Oral Oral Oral   Resp: 18 18 18    Height:      Weight:   88.225 kg (194 lb 8 oz)   SpO2: 99% 99% 100%     Intake/Output Summary (Last 24 hours) at 02/15/15 1215 Last data filed at 02/15/15 0700  Gross per 24 hour  Intake    240 ml  Output      0 ml  Net    240 ml   Filed Weights   02/14/15 0350 02/15/15 0557  Weight: 87.544 kg (193 lb) 88.225 kg (194 lb 8 oz)    Exam: Gen:  NAD Cardiovascular:  RRR, No M/R/G Respiratory:  Lungs  CTAB Gastrointestinal:  Abdomen soft, NT/ND, + BS Extremities:  No C/E/C   Data Reviewed:    Labs: Basic Metabolic Panel:  Recent Labs Lab 02/14/15 0410 02/14/15 0845 02/15/15 0544  NA 140  --  141  K 4.3  --  4.6  CL 106  --  106  CO2  --   --  24  GLUCOSE 196*  --  132*  BUN 25*  --  22*  CREATININE 1.00 0.90 0.75  CALCIUM  --   --  9.3   GFR Estimated Creatinine Clearance: 68.3 mL/min (by C-G formula based on Cr of 0.75). Liver Function Tests:  Recent Labs Lab 02/14/15 0845  AST 30  ALT 24  ALKPHOS 52  BILITOT  0.7  PROT 6.5  ALBUMIN 3.9   CBC:  Recent Labs Lab 02/14/15 0357 02/14/15 0410 02/14/15 0845  WBC 7.2  --  10.2  NEUTROABS 3.7  --   --   HGB 13.2 13.9 13.0  HCT 40.6 41.0 39.3  MCV 92.7  --  90.6  PLT 262  --  258   Cardiac Enzymes:  Recent Labs Lab 02/15/15 0826  TROPONINI 1.36*   Hgb A1c: No results for input(s): HGBA1C in the last 72 hours. Lipid Profile:  Recent Labs  02/15/15 0514  CHOL 177  HDL 30*  LDLCALC 74  TRIG 363*  CHOLHDL 5.9   Thyroid function studies:  Recent Labs  02/14/15 0845  TSH 1.179   Microbiology No results found for this or any previous visit (from the past 240 hour(s)).   Medications:   . aspirin EC  81 mg Oral Daily  . atorvastatin  10 mg Oral q1800  . digoxin  250 mcg Oral Daily  . DULoxetine  60 mg Oral Daily  . enoxaparin (LOVENOX) injection  40 mg Subcutaneous Q24H  . ezetimibe  10 mg Oral Daily  . gemfibrozil  600 mg Oral BID  . insulin glargine  30 Units Subcutaneous QHS  . metFORMIN  1,000 mg Oral BID WC  . metoprolol succinate  25 mg Oral Daily  . ramipril  10 mg Oral Daily  . rOPINIRole  1 mg Oral QHS  . verapamil  240 mg Oral Daily   Continuous Infusions:   Time spent: 25 minutes.   LOS: 1 day   Kyrie Fludd  [facility] Pager [phone]. If unable to reach me by pager, please call my cell phone at [phone].  *Please refer to [email or web address], password [email or web address] to get updated schedule on who will round on this patient, as hospitalists switch teams weekly. If 7PM-7AM, please contact night-coverage at [email or web address], password [email or web address] for any overnight needs.  02/15/2015, 12:15 PM

## 2015-02-15 NOTE — Progress Notes (Signed)
ABNORMAL LAB- TROP 1.36, Provider on call paged with update. Pt denies complaints of any kind at this time. VS stable 140/60, 74, 16, 98 % ra. Will continue to monitor

## 2015-02-16 ENCOUNTER — Encounter (HOSPITAL_COMMUNITY)
Admission: EM | Disposition: A | Discharge status: Home / Self Care | Payer: Self-pay | Source: Home / Self Care | Attending: Internal Medicine

## 2015-02-16 ENCOUNTER — Encounter (HOSPITAL_COMMUNITY): Payer: Self-pay | Admitting: Cardiovascular Disease

## 2015-02-16 DIAGNOSIS — I10 Essential (primary) hypertension: Secondary | ICD-10-CM | POA: Diagnosis not present

## 2015-02-16 DIAGNOSIS — E1149 Type 2 diabetes mellitus with other diabetic neurological complication: Secondary | ICD-10-CM | POA: Diagnosis not present

## 2015-02-16 DIAGNOSIS — E785 Hyperlipidemia, unspecified: Secondary | ICD-10-CM | POA: Diagnosis not present

## 2015-02-16 DIAGNOSIS — R7989 Other specified abnormal findings of blood chemistry: Secondary | ICD-10-CM | POA: Diagnosis not present

## 2015-02-16 DIAGNOSIS — R079 Chest pain, unspecified: Secondary | ICD-10-CM | POA: Diagnosis not present

## 2015-02-16 DIAGNOSIS — I471 Supraventricular tachycardia: Secondary | ICD-10-CM | POA: Diagnosis not present

## 2015-02-16 DIAGNOSIS — I248 Other forms of acute ischemic heart disease: Secondary | ICD-10-CM | POA: Diagnosis not present

## 2015-02-16 HISTORY — PX: CARDIAC CATHETERIZATION: SHX172

## 2015-02-16 LAB — CBC
HCT: 42 % (ref 36.0–46.0)
Hemoglobin: 14 g/dL (ref 12.0–15.0)
MCH: 30.6 pg (ref 26.0–34.0)
MCHC: 33.3 g/dL (ref 30.0–36.0)
MCV: 91.7 fL (ref 78.0–100.0)
Platelets: 242 10*3/uL (ref 150–400)
RBC: 4.58 MIL/uL (ref 3.87–5.11)
RDW: 13.2 % (ref 11.5–15.5)
WBC: 6.5 10*3/uL (ref 4.0–10.5)

## 2015-02-16 LAB — PROTIME-INR
INR: 1 (ref 0.00–1.49)
Prothrombin Time: 13.4 seconds (ref 11.6–15.2)

## 2015-02-16 LAB — COMPREHENSIVE METABOLIC PANEL
ALT: 20 U/L (ref 14–54)
AST: 27 U/L (ref 15–41)
Albumin: 4.2 g/dL (ref 3.5–5.0)
Alkaline Phosphatase: 59 U/L (ref 38–126)
Anion gap: 8 (ref 5–15)
BUN: 20 mg/dL (ref 6–20)
CO2: 22 mmol/L (ref 22–32)
Calcium: 9.4 mg/dL (ref 8.9–10.3)
Chloride: 108 mmol/L (ref 101–111)
Creatinine, Ser: 0.64 mg/dL (ref 0.44–1.00)
GFR calc Af Amer: >60 mL/min (ref >60)
GFR calc non Af Amer: >60 mL/min (ref >60)
Glucose, Bld: 103 mg/dL — ABNORMAL HIGH (ref 65–99)
Potassium: 4.6 mmol/L (ref 3.5–5.1)
Sodium: 138 mmol/L (ref 135–145)
Total Bilirubin: 1.1 mg/dL (ref 0.3–1.2)
Total Protein: 7 g/dL (ref 6.5–8.1)

## 2015-02-16 LAB — HEMOGLOBIN A1C
Hgb A1c MFr Bld: 7 % — ABNORMAL HIGH (ref 4.8–5.6)
Mean Plasma Glucose: 154 mg/dL

## 2015-02-16 LAB — GLUCOSE, CAPILLARY: Glucose-Capillary: 101 mg/dL — ABNORMAL HIGH (ref 65–99)

## 2015-02-16 LAB — TROPONIN I: Troponin I: 0.97 ng/mL (ref <0.031)

## 2015-02-16 SURGERY — LEFT HEART CATH AND CORONARY ANGIOGRAPHY
Anesthesia: LOCAL

## 2015-02-16 MED ORDER — SODIUM CHLORIDE 0.9 % IV SOLN: 250.0000 mL | INTRAVENOUS | Status: DC | PRN

## 2015-02-16 MED ORDER — HEPARIN SODIUM (PORCINE) 1000 UNIT/ML IJ SOLN
INTRAMUSCULAR | Status: DC | PRN
  Administered 2015-02-16: 5000 [IU] via INTRAVENOUS

## 2015-02-16 MED ORDER — MIDAZOLAM HCL 2 MG/2ML IJ SOLN
INTRAMUSCULAR | Status: AC
  Filled 2015-02-16: qty 2

## 2015-02-16 MED ORDER — ASPIRIN 81 MG PO CHEW
81.0000 mg | CHEWABLE_TABLET | ORAL | Status: AC
  Administered 2015-02-16: 81 mg via ORAL

## 2015-02-16 MED ORDER — LIDOCAINE HCL (PF) 1 % IJ SOLN
INTRAMUSCULAR | Status: AC
  Filled 2015-02-16: qty 30

## 2015-02-16 MED ORDER — SODIUM CHLORIDE 0.9 % IJ SOLN: 3.0000 mL | Freq: Two times a day (BID) | INTRAMUSCULAR | Status: DC

## 2015-02-16 MED ORDER — SODIUM CHLORIDE 0.9 % IV SOLN: INTRAVENOUS | Status: DC

## 2015-02-16 MED ORDER — HEPARIN SODIUM (PORCINE) 1000 UNIT/ML IJ SOLN
INTRAMUSCULAR | Status: AC
  Filled 2015-02-16: qty 1

## 2015-02-16 MED ORDER — SODIUM CHLORIDE 0.9 % IJ SOLN: 3.0000 mL | INTRAMUSCULAR | Status: DC | PRN

## 2015-02-16 MED ORDER — IOHEXOL 350 MG/ML SOLN
INTRAVENOUS | Status: DC | PRN
  Administered 2015-02-16: 60 mL via INTRA_ARTERIAL

## 2015-02-16 MED ORDER — LIDOCAINE HCL (PF) 1 % IJ SOLN
INTRAMUSCULAR | Status: DC | PRN
  Administered 2015-02-16: 16:00:00

## 2015-02-16 MED ORDER — MIDAZOLAM HCL 2 MG/2ML IJ SOLN
INTRAMUSCULAR | Status: DC | PRN
  Administered 2015-02-16: 2 mg via INTRAVENOUS

## 2015-02-16 MED ORDER — HEPARIN (PORCINE) IN NACL 2-0.9 UNIT/ML-% IJ SOLN
INTRAMUSCULAR | Status: AC
  Filled 2015-02-16: qty 1000

## 2015-02-16 MED ORDER — VERAPAMIL HCL 2.5 MG/ML IV SOLN
INTRAVENOUS | Status: DC | PRN
  Administered 2015-02-16: 10 mL via INTRA_ARTERIAL

## 2015-02-16 MED ORDER — VERAPAMIL HCL 2.5 MG/ML IV SOLN
INTRAVENOUS | Status: AC
  Filled 2015-02-16: qty 2

## 2015-02-16 MED ORDER — ASPIRIN 81 MG PO CHEW
CHEWABLE_TABLET | ORAL | Status: AC
  Filled 2015-02-16: qty 1

## 2015-02-16 SURGICAL SUPPLY — 11 items

## 2015-02-16 NOTE — H&P (View-Only) (Signed)
Subjective:  Patient admitted with SVT but also severe chest pain is different than what she had before.  Opponens are abnormal and post tachycardia EKG showed significant ST depression.  Multiple cardiac risk factors.  Objective:  Vital Signs in the last 24 hours: BP 164/88 mmHg  Pulse 80  Temp(Src) 98 F (36.7 C) (Oral)  Resp 18  Ht 5\' 4"  (1.626 m)  Wt 88.361 kg (194 lb 12.8 oz)  BMI 33.42 kg/m2  SpO2 98%  Physical Exam: Pleasant mildly obese female in no acute distress Lungs:  Clear Cardiac:  Regular rhythm, normal S1 and S2, no S3 Abdomen:  Soft, nontender, no masses Extremities: Radial pulse present bilaterally.  Intake/Output from previous day:    Weight Filed Weights   02/14/15 0350 02/15/15 0557 02/16/15 0619  Weight: 87.544 kg (193 lb) 88.225 kg (194 lb 8 oz) 88.361 kg (194 lb 12.8 oz)    Lab Results: Basic Metabolic Panel:  Recent Labs  02/14/15 0410 02/14/15 0845 02/15/15 0544  NA 140  --  141  K 4.3  --  4.6  CL 106  --  106  CO2  --   --  24  GLUCOSE 196*  --  132*  BUN 25*  --  22*  CREATININE 1.00 0.90 0.75   CBC:  Recent Labs  02/14/15 0357 02/14/15 0410 02/14/15 0845  WBC 7.2  --  10.2  NEUTROABS 3.7  --   --   HGB 13.2 13.9 13.0  HCT 40.6 41.0 39.3  MCV 92.7  --  90.6  PLT 262  --  258   Cardiac Enzymes: Troponin (Point of Care Test)  Recent Labs  02/14/15 0407  TROPIPOC 0.05   Cardiac Panel (last 3 results)  Recent Labs  02/15/15 1430 02/15/15 2028 02/16/15 0220  TROPONINI 1.22* 1.07* 0.97*    Telemetry: Sinus rhythm overnight  Assessment/Plan:  1.  Prolonged chest discomfort in the setting of SVT accompanied with significant ST depression and elevated troponins.  Unclear if troponins are due to demand ischemia due to tachycardia or due to underlying coronary artery disease.  She has had a functional stress test.  With multiple cardiovascular risk factors and abnormal troponins and ST depression recommend  catheterization.  Cardiac catheterization was discussed with the patient fully including risks of myocardial infarction, death, stroke, bleeding, arrhythmia, dye allergy, renal insufficiency or bleeding.  The patient understands and is willing to proceed.  The stability of stenting also discussed with patient.  Discussed radial versus femoral approach.  She is agreeable to proceed. 2.  Type 2 diabetes 3.  History of anxiety 4.  History of SVT  Recommendations:  Nothing by mouth for now and plan catheterization later today by [facility]   Through the radial approach.     Kerry Hough  MD [facility]  02/16/2015, 7:21 AM

## 2015-02-16 NOTE — Discharge Summary (Signed)
Physician Discharge Summary  [name] [id number] DOB: 10/31/1940 DOA: 02/14/2015  PCP: Antony Blackbird, MD  Admit date: 02/14/2015 Discharge date: 02/16/2015   Recommendations for Outpatient Follow-Up:   1. F/U with PCP and cardiologist as needed.  Discharge Diagnosis:   Principal Problem:    Demand ischemia ([place]) Active Problems:    DM (diabetes mellitus) type II controlled, neurological manifestation (HCC)    Paroxysmal atrial tachycardia (HCC)    Essential hypertension, benign    Hyperlipidemia    Palpitations    PAT (paroxysmal atrial tachycardia) (HCC)    Elevated troponin  Discharge disposition:  Home.   Discharge Condition: Improved.  Diet recommendation: Low sodium, heart healthy.  Carbohydrate-modified.    History of Present Illness:   [name] is an 75 y.o. female with a PMH of PAT since the age of 34, managed by Dr. Wynonia Lawman with Verapamil and metoprolol, who was admitted 02/14/15 with severe "25/10" chest pain radiating to her jaw, back and shoulders, associated with diaphoresis and nausea in the setting of HR in the 170s and ischemic changes noted on EKG. She spontaneously converted to NSR in the ED, and her symptoms resolved at that time.   Hospital Course by Problem:   Principal Problem:  Demand ischemia ([place]) secondary to PAT - Ischemic changes noted on EKG. S/P ASA, continue daily.  - Symptoms resolved with conversion to NSR. - Continue home dose of Verapamil and metoprolol with IV metoprolol PRN recurrent PAT. - Cardiology consulted, s/p cardiac catheterization. Troponin elevations likely from demand ischemia. - TSH WNL. - Cleared for D/C by cardiology post cath.  Active Problems:  DM (diabetes mellitus) type II controlled, neurological manifestation (HCC) - Continue Lantus and Metformin.  - Hemoglobin A1c 7%.   Essential hypertension, benign - Continue Verapamil, Altace, metoprolol.    Hyperlipidemia/Hypertriglyceridemia - Continue Statin, Zetia and Lopid.   Depression/Fibromyalgia - Continue Cymbalta.    Medical Consultants:    Cardiology   Discharge Exam:   Filed Vitals:   02/16/15 1625 02/16/15 1630  BP: 152/79 147/85  Pulse: 67 69  Temp:    Resp: 12 9   Filed Vitals:   02/16/15 1615 02/16/15 1620 02/16/15 1625 02/16/15 1630  BP: 166/78 165/79 152/79 147/85  Pulse: 72 75 67 69  Temp:      TempSrc:      Resp: 19 14 12 9   Height:      Weight:      SpO2: 96% 97% 100% 98%    Gen:  NAD Cardiovascular:  RRR, No M/R/G Respiratory: Lungs CTAB Gastrointestinal: Abdomen soft, NT/ND with normal active bowel sounds. Extremities: No C/E/C   The results of significant diagnostics from this hospitalization (including imaging, microbiology, ancillary and laboratory) are listed below for reference.     Procedures and Diagnostic Studies:   Dg Chest Portable 1 View  02/14/2015  CLINICAL DATA:  75 year old female with palpitation. EXAM: PORTABLE CHEST 1 VIEW COMPARISON:  Chest radiograph dated 08/19/2014 FINDINGS: There is stable cardiomegaly. There is diffuse interstitial prominence. No focal consolidation, pleural effusion, or pneumothorax. There is diffuse degenerative changes of the spine. Partially visualized cervical fixation plate and screws. Upper lumbar fixation screws noted. IMPRESSION: Mild interstitial prominence may represent a degree of congestion. No focal consolidation. Electronically Signed   By: Anner Crete M.D.   On: 02/14/2015 03:52     Labs:   Basic Metabolic Panel:  Recent Labs Lab 02/14/15 0410 02/14/15 0845 02/15/15 0544 02/16/15 1243  NA 140  --  141 138  K 4.3  --  4.6 4.6  CL 106  --  106 108  CO2  --   --  24 22  GLUCOSE 196*  --  132* 103*  BUN 25*  --  22* 20  CREATININE 1.00 0.90 0.75 0.64  CALCIUM  --   --  9.3 9.4   GFR Estimated Creatinine Clearance: 68.4 mL/min (by C-G formula based on Cr of  0.64). Liver Function Tests:  Recent Labs Lab 02/14/15 0845 02/16/15 1243  AST 30 27  ALT 24 20  ALKPHOS 52 59  BILITOT 0.7 1.1  PROT 6.5 7.0  ALBUMIN 3.9 4.2   Coagulation profile  Recent Labs Lab 02/16/15 1243  INR 1.00    CBC:  Recent Labs Lab 02/14/15 0357 02/14/15 0410 02/14/15 0845 02/16/15 1243  WBC 7.2  --  10.2 6.5  NEUTROABS 3.7  --   --   --   HGB 13.2 13.9 13.0 14.0  HCT 40.6 41.0 39.3 42.0  MCV 92.7  --  90.6 91.7  PLT 262  --  258 242   Cardiac Enzymes:  Recent Labs Lab 02/15/15 0826 02/15/15 1430 02/15/15 2028 02/16/15 0220  TROPONINI 1.36* 1.22* 1.07* 0.97*   CBG:  Recent Labs Lab 02/16/15 1511  GLUCAP 101*   Hgb A1c  Recent Labs  02/14/15 0845  HGBA1C 7.0*   Lipid Profile  Recent Labs  02/15/15 0514  CHOL 177  HDL 30*  LDLCALC 74  TRIG 363*  CHOLHDL 5.9   Thyroid function studies  Recent Labs  02/14/15 0845  TSH 1.179     Discharge Instructions:   Discharge Instructions    Call MD for:  extreme fatigue    Complete by:  As directed      Call MD for:  persistant nausea and vomiting    Complete by:  As directed      Call MD for:  severe uncontrolled pain    Complete by:  As directed      Diet - low sodium heart healthy    Complete by:  As directed      Diet Carb Modified    Complete by:  As directed      Increase activity slowly    Complete by:  As directed             Medication List    STOP taking these medications        metFORMIN 1000 MG tablet  Commonly known as:  GLUCOPHAGE      TAKE these medications        aspirin EC 81 MG tablet  Take 81 mg by mouth daily.     Cranberry 1000 MG Caps  Take 1,000 mg by mouth 2 (two) times daily.     digoxin 0.25 MG tablet  Commonly known as:  LANOXIN  Take 250 mcg by mouth daily.     DULoxetine 60 MG capsule  Commonly known as:  CYMBALTA  Take 60 mg by mouth daily.     ezetimibe 10 MG tablet  Commonly known as:  ZETIA  Take 10 mg by mouth  daily.     gemfibrozil 600 MG tablet  Commonly known as:  LOPID  Take 600 mg by mouth 2 (two) times daily.     ICAPS AREDS FORMULA PO  Take 1 tablet by mouth 2 (two) times daily.     insulin glargine 100 UNIT/ML injection  Commonly known as:  LANTUS  Inject  30 Units into the skin at bedtime.     metoprolol succinate 25 MG 24 hr tablet  Commonly known as:  TOPROL-XL  Take 25 mg by mouth daily.     nitroGLYCERIN 0.4 MG SL tablet  Commonly known as:  NITROSTAT  Place 0.4 mg under the tongue every 5 (five) minutes as needed. As needed chest pain     ramipril 10 MG capsule  Commonly known as:  ALTACE  Take 10 mg by mouth daily.     RECLAST IV  Inject into the vein as directed. Once a year last April 2016     rOPINIRole 1 MG tablet  Commonly known as:  REQUIP  Take 1 mg by mouth at bedtime.     verapamil 240 MG 24 hr capsule  Commonly known as:  VERELAN PM  Take 240 mg by mouth daily.     VITAMIN B COMPLEX IJ  Inject as directed every 30 (thirty) days. Receives at Dr. Chapman Fitch     Vitamin D (Ergocalciferol) 50000 units Caps capsule  Commonly known as:  DRISDOL  Take 50,000 Units by mouth 2 (two) times a week.           Follow-up Information    Schedule an appointment as soon as possible for a visit with FULP, CAMMIE, MD.   Specialty:  Family Medicine   Why:  If symptoms worsen   Contact information:   [address] [address] [phone]        Time coordinating discharge: 35 minutes.  Signed:  Niralya Ohanian  Pager [phone] [facility] 02/16/2015, 4:50 PM

## 2015-02-16 NOTE — Interval H&P Note (Signed)
Cath Lab Visit (complete for each Cath Lab visit)  Clinical Evaluation Leading to the Procedure:   ACS: Yes.    Non-ACS:    Anginal Classification: CCS IV  Anti-ischemic medical therapy: Minimal Therapy (1 class of medications)  Non-Invasive Test Results: No non-invasive testing performed  Prior CABG: No previous CABG      History and Physical Interval Note:  02/16/2015 3:55 PM  [name]  has presented today for surgery, with the diagnosis of c/p  The various methods of treatment have been discussed with the patient and family. After consideration of risks, benefits and other options for treatment, the patient has consented to  Procedure(s): Left Heart Cath and Coronary Angiography (N/A) as a surgical intervention .  The patient's history has been reviewed, patient examined, no change in status, stable for surgery.  I have reviewed the patient's chart and labs.  Questions were answered to the patient's satisfaction.     Sherren Mocha

## 2015-02-16 NOTE — Progress Notes (Signed)
Subjective:  Patient admitted with SVT but also severe chest pain is different than what she had before.  Opponens are abnormal and post tachycardia EKG showed significant ST depression.  Multiple cardiac risk factors.  Objective:  Vital Signs in the last 24 hours: BP 164/88 mmHg  Pulse 80  Temp(Src) 98 F (36.7 C) (Oral)  Resp 18  Ht 5\' 4"  (1.626 m)  Wt 88.361 kg (194 lb 12.8 oz)  BMI 33.42 kg/m2  SpO2 98%  Physical Exam: Pleasant mildly obese female in no acute distress Lungs:  Clear Cardiac:  Regular rhythm, normal S1 and S2, no S3 Abdomen:  Soft, nontender, no masses Extremities: Radial pulse present bilaterally.  Intake/Output from previous day:    Weight Filed Weights   02/14/15 0350 02/15/15 0557 02/16/15 0619  Weight: 87.544 kg (193 lb) 88.225 kg (194 lb 8 oz) 88.361 kg (194 lb 12.8 oz)    Lab Results: Basic Metabolic Panel:  Recent Labs  02/14/15 0410 02/14/15 0845 02/15/15 0544  NA 140  --  141  K 4.3  --  4.6  CL 106  --  106  CO2  --   --  24  GLUCOSE 196*  --  132*  BUN 25*  --  22*  CREATININE 1.00 0.90 0.75   CBC:  Recent Labs  02/14/15 0357 02/14/15 0410 02/14/15 0845  WBC 7.2  --  10.2  NEUTROABS 3.7  --   --   HGB 13.2 13.9 13.0  HCT 40.6 41.0 39.3  MCV 92.7  --  90.6  PLT 262  --  258   Cardiac Enzymes: Troponin (Point of Care Test)  Recent Labs  02/14/15 0407  TROPIPOC 0.05   Cardiac Panel (last 3 results)  Recent Labs  02/15/15 1430 02/15/15 2028 02/16/15 0220  TROPONINI 1.22* 1.07* 0.97*    Telemetry: Sinus rhythm overnight  Assessment/Plan:  1.  Prolonged chest discomfort in the setting of SVT accompanied with significant ST depression and elevated troponins.  Unclear if troponins are due to demand ischemia due to tachycardia or due to underlying coronary artery disease.  She has had a functional stress test.  With multiple cardiovascular risk factors and abnormal troponins and ST depression recommend  catheterization.  Cardiac catheterization was discussed with the patient fully including risks of myocardial infarction, death, stroke, bleeding, arrhythmia, dye allergy, renal insufficiency or bleeding.  The patient understands and is willing to proceed.  The stability of stenting also discussed with patient.  Discussed radial versus femoral approach.  She is agreeable to proceed. 2.  Type 2 diabetes 3.  History of anxiety 4.  History of SVT  Recommendations:  Nothing by mouth for now and plan catheterization later today by [facility]   Through the radial approach.     Kerry Hough  MD [facility]  02/16/2015, 7:21 AM

## 2015-02-16 NOTE — Progress Notes (Signed)
Inpatient Diabetes Program Recommendations  AACE/ADA: New Consensus Statement on Inpatient Glycemic Control (2015)  Target Ranges:  Prepandial:   less than 140 mg/dL      Peak postprandial:   less than 180 mg/dL (1-2 hours)      Critically ill patients:  140 - 180 mg/dL   Review of Glycemic Control  Diabetes history: DM2 Outpatient Diabetes medications: Lantus 30 units QHS, metformin 1000 mg bid Current orders for Inpatient glycemic control: Lantus 30 units QHS  Results for Leah, [name] (MRN [id number]) as of 02/16/2015 10:52  Ref. Range 02/14/2015 08:45  Hemoglobin A1C Latest Ref Range: 4.8-5.6 % 7.0 (H)  Results for Leah, [name] (MRN [id number]) as of 02/16/2015 10:52  Ref. Range 02/15/2015 05:44  Sodium Latest Ref Range: 135-145 mmol/L 141  Potassium Latest Ref Range: 3.5-5.1 mmol/L 4.6  Chloride Latest Ref Range: 101-111 mmol/L 106  CO2 Latest Ref Range: 22-32 mmol/L 24  BUN Latest Ref Range: 6-20 mg/dL 22 (H)  Creatinine Latest Ref Range: 0.44-1.00 mg/dL 0.75  Calcium Latest Ref Range: 8.9-10.3 mg/dL 9.3  EGFR (Non-African Amer.) Latest Ref Range: >60 mL/min >60  EGFR (African American) Latest Ref Range: >60 mL/min >60  Glucose Latest Ref Range: 65-99 mg/dL 132 (H)  Anion gap Latest Ref Range: 5-15  11  HgbA1C shows good glycemic control at home.  Inpatient Diabetes Program Recommendations:    Add Novolog sensitive tidwc.  Will continue to follow. Thank you. Lorenda Peck, RD, LDN, CDE Inpatient Diabetes Coordinator [phone]

## 2015-02-16 NOTE — Progress Notes (Signed)
Progress Note   [name] [id number] DOB: July 16, 1940 DOA: 02/14/2015 PCP: Antony Blackbird, MD   Brief Narrative:   [name] is an 75 y.o. female with a PMH of PAT since the age of 89, managed by Dr. Wynonia Lawman with Verapamil and metoprolol, who was admitted 02/14/15 with severe "25/10" chest pain radiating to her jaw, back and shoulders, associated with diaphoresis and nausea in the setting of HR in the 170s and ischemic changes noted on EKG. She spontaneously converted to NSR in the ED, and her symptoms resolved at that time.  Assessment/Plan:   Principal Problem:  Demand ischemia ([place]) secondary to PAT - Ischemic changes noted on EKG. S/P ASA, continue daily.  - Symptoms resolved with conversion to NSR. - Continue home dose of Verapamil and metoprolol with IV metoprolol PRN recurrent PAT. - Cardiology consulted, cardiac catheterization planned for today. Troponin elevations likely from demand ischemia. - TSH WNL.  Active Problems:  DM (diabetes mellitus) type II controlled, neurological manifestation (HCC) - Continue Lantus and Metformin.  - Hemoglobin A1c 7%.   Essential hypertension, benign - Continue Verapamil, Altace, metoprolol.   Hyperlipidemia/Hypertriglyceridemia - Continue Statin, Zetia and Lopid.   Depression/Fibromyalgia - Continue Cymbalta.   DVT prophylaxis - Lovenox ordered.   Family Communication/Anticipated D/C date and plan/Code Status   Family Communication: No family at the bedside. Disposition Plan: Home if stable after cardiac catheterization. Anticipated D/C date:   Later today if catheterization clear, otherwise tomorrow. Code Status: Full code.   IV Access:    Peripheral IV   Procedures and diagnostic studies:   Dg Chest Portable 1 View  02/14/2015  CLINICAL DATA:  75 year old female with palpitation. EXAM: PORTABLE CHEST 1 VIEW COMPARISON:  Chest radiograph dated 08/19/2014 FINDINGS: There is stable cardiomegaly.  There is diffuse interstitial prominence. No focal consolidation, pleural effusion, or pneumothorax. There is diffuse degenerative changes of the spine. Partially visualized cervical fixation plate and screws. Upper lumbar fixation screws noted. IMPRESSION: Mild interstitial prominence may represent a degree of congestion. No focal consolidation. Electronically Signed   By: Anner Crete M.D.   On: 02/14/2015 03:52     Medical Consultants:    Cardiology  Anti-Infectives:   Anti-infectives    None      Subjective:   [name] denies any further episodes of chest pain. Ambulating in the halls without difficulty. No nausea or diaphoresis.  Objective:    Filed Vitals:   02/15/15 0944 02/15/15 1357 02/15/15 2053 02/16/15 0619  BP: 148/84 121/66 157/73 164/88  Pulse: 74 66 59 80  Temp:  97.7 F (36.5 C) 97.6 F (36.4 C) 98 F (36.7 C)  TempSrc:  Oral Oral Oral  Resp:  18 20 18   Height:      Weight:    88.361 kg (194 lb 12.8 oz)  SpO2:  99% 97% 98%   No intake or output data in the 24 hours ending 02/16/15 0852 Filed Weights   02/14/15 0350 02/15/15 0557 02/16/15 0619  Weight: 87.544 kg (193 lb) 88.225 kg (194 lb 8 oz) 88.361 kg (194 lb 12.8 oz)    Exam: Gen:  NAD Cardiovascular:  RRR, No M/R/G Respiratory:  Lungs CTAB Gastrointestinal:  Abdomen soft, NT/ND, + BS Extremities:  No C/E/C   Data Reviewed:    Labs: Basic Metabolic Panel:  Recent Labs Lab 02/14/15 0410 02/14/15 0845 02/15/15 0544  NA 140  --  141  K 4.3  --  4.6  CL  106  --  106  CO2  --   --  24  GLUCOSE 196*  --  132*  BUN 25*  --  22*  CREATININE 1.00 0.90 0.75  CALCIUM  --   --  9.3   GFR Estimated Creatinine Clearance: 68.4 mL/min (by C-G formula based on Cr of 0.75). Liver Function Tests:  Recent Labs Lab 02/14/15 0845  AST 30  ALT 24  ALKPHOS 52  BILITOT 0.7  PROT 6.5  ALBUMIN 3.9   CBC:  Recent Labs Lab 02/14/15 0357 02/14/15 0410 02/14/15 0845  WBC 7.2   --  10.2  NEUTROABS 3.7  --   --   HGB 13.2 13.9 13.0  HCT 40.6 41.0 39.3  MCV 92.7  --  90.6  PLT 262  --  258   Cardiac Enzymes:  Recent Labs Lab 02/15/15 0826 02/15/15 1430 02/15/15 2028 02/16/15 0220  TROPONINI 1.36* 1.22* 1.07* 0.97*   Hgb A1c:  Recent Labs  02/14/15 0845  HGBA1C 7.0*   CBG (last 3)  No results for input(s): GLUCAP in the last 72 hours.   Lipid Profile:  Recent Labs  02/15/15 0514  CHOL 177  HDL 30*  LDLCALC 74  TRIG 363*  CHOLHDL 5.9   Thyroid function studies:  Recent Labs  02/14/15 0845  TSH 1.179   Microbiology No results found for this or any previous visit (from the past 240 hour(s)).   Medications:   . aspirin EC  81 mg Oral Daily  . atorvastatin  10 mg Oral q1800  . digoxin  250 mcg Oral Daily  . DULoxetine  60 mg Oral Daily  . enoxaparin (LOVENOX) injection  40 mg Subcutaneous Q24H  . ezetimibe  10 mg Oral Daily  . gemfibrozil  600 mg Oral BID  . insulin glargine  30 Units Subcutaneous QHS  . metFORMIN  1,000 mg Oral BID WC  . metoprolol succinate  25 mg Oral Daily  . ramipril  10 mg Oral Daily  . rOPINIRole  1 mg Oral QHS  . verapamil  240 mg Oral Daily   Continuous Infusions:   Time spent: 25 minutes.   LOS: 2 days   Renley Banwart  [facility] Pager [phone]. If unable to reach me by pager, please call my cell phone at [phone].  *Please refer to [email or web address], password [email or web address] to get updated schedule on who will round on this patient, as hospitalists switch teams weekly. If 7PM-7AM, please contact night-coverage at [email or web address], password [email or web address] for any overnight needs.  02/16/2015, 8:52 AM

## 2015-02-18 DIAGNOSIS — I2510 Atherosclerotic heart disease of native coronary artery without angina pectoris: Secondary | ICD-10-CM | POA: Diagnosis not present

## 2015-02-18 DIAGNOSIS — I471 Supraventricular tachycardia: Secondary | ICD-10-CM | POA: Diagnosis not present

## 2015-02-18 DIAGNOSIS — E538 Deficiency of other specified B group vitamins: Secondary | ICD-10-CM | POA: Diagnosis not present

## 2015-02-18 DIAGNOSIS — Z01810 Encounter for preprocedural cardiovascular examination: Secondary | ICD-10-CM | POA: Diagnosis not present

## 2015-02-18 DIAGNOSIS — E785 Hyperlipidemia, unspecified: Secondary | ICD-10-CM | POA: Diagnosis not present

## 2015-02-18 DIAGNOSIS — E668 Other obesity: Secondary | ICD-10-CM | POA: Diagnosis not present

## 2015-02-18 DIAGNOSIS — I10 Essential (primary) hypertension: Secondary | ICD-10-CM | POA: Diagnosis not present

## 2015-02-18 DIAGNOSIS — E1140 Type 2 diabetes mellitus with diabetic neuropathy, unspecified: Secondary | ICD-10-CM | POA: Diagnosis not present

## 2015-03-03 DIAGNOSIS — E1140 Type 2 diabetes mellitus with diabetic neuropathy, unspecified: Secondary | ICD-10-CM | POA: Diagnosis not present

## 2015-03-03 DIAGNOSIS — E559 Vitamin D deficiency, unspecified: Secondary | ICD-10-CM | POA: Diagnosis not present

## 2015-03-03 DIAGNOSIS — I471 Supraventricular tachycardia: Secondary | ICD-10-CM | POA: Diagnosis not present

## 2015-03-03 DIAGNOSIS — I10 Essential (primary) hypertension: Secondary | ICD-10-CM | POA: Diagnosis not present

## 2015-03-03 DIAGNOSIS — E785 Hyperlipidemia, unspecified: Secondary | ICD-10-CM | POA: Diagnosis not present

## 2015-03-03 DIAGNOSIS — F419 Anxiety disorder, unspecified: Secondary | ICD-10-CM | POA: Diagnosis not present

## 2015-03-15 DIAGNOSIS — M25551 Pain in right hip: Secondary | ICD-10-CM | POA: Diagnosis not present

## 2015-03-18 DIAGNOSIS — M1611 Unilateral primary osteoarthritis, right hip: Secondary | ICD-10-CM | POA: Diagnosis not present

## 2015-03-22 DIAGNOSIS — E538 Deficiency of other specified B group vitamins: Secondary | ICD-10-CM | POA: Diagnosis not present

## 2015-04-19 DIAGNOSIS — E538 Deficiency of other specified B group vitamins: Secondary | ICD-10-CM | POA: Diagnosis not present

## 2015-05-20 DIAGNOSIS — E538 Deficiency of other specified B group vitamins: Secondary | ICD-10-CM | POA: Diagnosis not present

## 2015-05-21 DIAGNOSIS — H35033 Hypertensive retinopathy, bilateral: Secondary | ICD-10-CM | POA: Diagnosis not present

## 2015-05-21 DIAGNOSIS — H524 Presbyopia: Secondary | ICD-10-CM | POA: Diagnosis not present

## 2015-05-24 DIAGNOSIS — E781 Pure hyperglyceridemia: Secondary | ICD-10-CM | POA: Diagnosis not present

## 2015-05-24 DIAGNOSIS — E042 Nontoxic multinodular goiter: Secondary | ICD-10-CM | POA: Diagnosis not present

## 2015-05-24 DIAGNOSIS — Z7984 Long term (current) use of oral hypoglycemic drugs: Secondary | ICD-10-CM | POA: Diagnosis not present

## 2015-05-24 DIAGNOSIS — E1140 Type 2 diabetes mellitus with diabetic neuropathy, unspecified: Secondary | ICD-10-CM | POA: Diagnosis not present

## 2015-05-24 DIAGNOSIS — E113293 Type 2 diabetes mellitus with mild nonproliferative diabetic retinopathy without macular edema, bilateral: Secondary | ICD-10-CM | POA: Diagnosis not present

## 2015-05-24 DIAGNOSIS — Z5181 Encounter for therapeutic drug level monitoring: Secondary | ICD-10-CM | POA: Diagnosis not present

## 2015-05-24 DIAGNOSIS — Z6834 Body mass index (BMI) 34.0-34.9, adult: Secondary | ICD-10-CM | POA: Diagnosis not present

## 2015-05-24 DIAGNOSIS — Z808 Family history of malignant neoplasm of other organs or systems: Secondary | ICD-10-CM | POA: Diagnosis not present

## 2015-05-24 DIAGNOSIS — M810 Age-related osteoporosis without current pathological fracture: Secondary | ICD-10-CM | POA: Diagnosis not present

## 2015-06-14 DIAGNOSIS — E1140 Type 2 diabetes mellitus with diabetic neuropathy, unspecified: Secondary | ICD-10-CM | POA: Diagnosis not present

## 2015-06-14 DIAGNOSIS — M810 Age-related osteoporosis without current pathological fracture: Secondary | ICD-10-CM | POA: Diagnosis not present

## 2015-06-14 DIAGNOSIS — Z7984 Long term (current) use of oral hypoglycemic drugs: Secondary | ICD-10-CM | POA: Diagnosis not present

## 2015-06-14 DIAGNOSIS — Z794 Long term (current) use of insulin: Secondary | ICD-10-CM | POA: Diagnosis not present

## 2015-06-22 DIAGNOSIS — E538 Deficiency of other specified B group vitamins: Secondary | ICD-10-CM | POA: Diagnosis not present

## 2015-06-24 DIAGNOSIS — T148 Other injury of unspecified body region: Secondary | ICD-10-CM | POA: Diagnosis not present

## 2015-06-24 DIAGNOSIS — R599 Enlarged lymph nodes, unspecified: Secondary | ICD-10-CM | POA: Diagnosis not present

## 2015-07-02 DIAGNOSIS — M810 Age-related osteoporosis without current pathological fracture: Secondary | ICD-10-CM | POA: Diagnosis not present

## 2015-07-15 DIAGNOSIS — M810 Age-related osteoporosis without current pathological fracture: Secondary | ICD-10-CM | POA: Diagnosis not present

## 2015-07-15 DIAGNOSIS — I471 Supraventricular tachycardia: Secondary | ICD-10-CM | POA: Diagnosis not present

## 2015-07-15 DIAGNOSIS — M797 Fibromyalgia: Secondary | ICD-10-CM | POA: Diagnosis not present

## 2015-07-15 DIAGNOSIS — E559 Vitamin D deficiency, unspecified: Secondary | ICD-10-CM | POA: Diagnosis not present

## 2015-07-15 DIAGNOSIS — E871 Hypo-osmolality and hyponatremia: Secondary | ICD-10-CM | POA: Diagnosis not present

## 2015-07-15 DIAGNOSIS — F329 Major depressive disorder, single episode, unspecified: Secondary | ICD-10-CM | POA: Diagnosis not present

## 2015-07-15 DIAGNOSIS — K219 Gastro-esophageal reflux disease without esophagitis: Secondary | ICD-10-CM | POA: Diagnosis not present

## 2015-07-15 DIAGNOSIS — E1140 Type 2 diabetes mellitus with diabetic neuropathy, unspecified: Secondary | ICD-10-CM | POA: Diagnosis not present

## 2015-07-15 DIAGNOSIS — Z8719 Personal history of other diseases of the digestive system: Secondary | ICD-10-CM | POA: Diagnosis not present

## 2015-07-15 DIAGNOSIS — E785 Hyperlipidemia, unspecified: Secondary | ICD-10-CM | POA: Diagnosis not present

## 2015-07-15 DIAGNOSIS — E538 Deficiency of other specified B group vitamins: Secondary | ICD-10-CM | POA: Diagnosis not present

## 2015-07-15 DIAGNOSIS — I10 Essential (primary) hypertension: Secondary | ICD-10-CM | POA: Diagnosis not present

## 2015-07-15 DIAGNOSIS — M179 Osteoarthritis of knee, unspecified: Secondary | ICD-10-CM | POA: Diagnosis not present

## 2015-07-26 ENCOUNTER — Encounter: Payer: Self-pay | Admitting: Cardiovascular Disease

## 2015-08-11 DIAGNOSIS — D239 Other benign neoplasm of skin, unspecified: Secondary | ICD-10-CM | POA: Diagnosis not present

## 2015-08-11 DIAGNOSIS — B079 Viral wart, unspecified: Secondary | ICD-10-CM | POA: Diagnosis not present

## 2015-08-11 DIAGNOSIS — L821 Other seborrheic keratosis: Secondary | ICD-10-CM | POA: Diagnosis not present

## 2015-09-21 DIAGNOSIS — E538 Deficiency of other specified B group vitamins: Secondary | ICD-10-CM | POA: Diagnosis not present

## 2015-09-21 DIAGNOSIS — M5136 Other intervertebral disc degeneration, lumbar region: Secondary | ICD-10-CM | POA: Diagnosis not present

## 2015-09-21 DIAGNOSIS — R296 Repeated falls: Secondary | ICD-10-CM | POA: Diagnosis not present

## 2015-10-05 DIAGNOSIS — M5136 Other intervertebral disc degeneration, lumbar region: Secondary | ICD-10-CM | POA: Diagnosis not present

## 2015-10-05 DIAGNOSIS — M545 Low back pain: Secondary | ICD-10-CM | POA: Diagnosis not present

## 2015-10-05 DIAGNOSIS — R296 Repeated falls: Secondary | ICD-10-CM | POA: Diagnosis not present

## 2015-10-08 DIAGNOSIS — R296 Repeated falls: Secondary | ICD-10-CM | POA: Diagnosis not present

## 2015-10-08 DIAGNOSIS — M5136 Other intervertebral disc degeneration, lumbar region: Secondary | ICD-10-CM | POA: Diagnosis not present

## 2015-10-08 DIAGNOSIS — M545 Low back pain: Secondary | ICD-10-CM | POA: Diagnosis not present

## 2015-10-11 DIAGNOSIS — M545 Low back pain: Secondary | ICD-10-CM | POA: Diagnosis not present

## 2015-10-11 DIAGNOSIS — R296 Repeated falls: Secondary | ICD-10-CM | POA: Diagnosis not present

## 2015-10-11 DIAGNOSIS — M5136 Other intervertebral disc degeneration, lumbar region: Secondary | ICD-10-CM | POA: Diagnosis not present

## 2015-10-15 DIAGNOSIS — M5136 Other intervertebral disc degeneration, lumbar region: Secondary | ICD-10-CM | POA: Diagnosis not present

## 2015-10-15 DIAGNOSIS — R296 Repeated falls: Secondary | ICD-10-CM | POA: Diagnosis not present

## 2015-10-15 DIAGNOSIS — M545 Low back pain: Secondary | ICD-10-CM | POA: Diagnosis not present

## 2015-10-19 DIAGNOSIS — E538 Deficiency of other specified B group vitamins: Secondary | ICD-10-CM | POA: Diagnosis not present

## 2015-10-19 DIAGNOSIS — M069 Rheumatoid arthritis, unspecified: Secondary | ICD-10-CM | POA: Diagnosis not present

## 2015-11-18 DIAGNOSIS — E538 Deficiency of other specified B group vitamins: Secondary | ICD-10-CM | POA: Diagnosis not present

## 2015-11-18 DIAGNOSIS — Z23 Encounter for immunization: Secondary | ICD-10-CM | POA: Diagnosis not present

## 2015-12-06 ENCOUNTER — Other Ambulatory Visit: Payer: Self-pay | Admitting: Family Medicine

## 2015-12-06 DIAGNOSIS — Z1231 Encounter for screening mammogram for malignant neoplasm of breast: Secondary | ICD-10-CM

## 2015-12-08 ENCOUNTER — Other Ambulatory Visit: Payer: Self-pay | Admitting: Dermatology

## 2015-12-08 DIAGNOSIS — D485 Neoplasm of uncertain behavior of skin: Secondary | ICD-10-CM | POA: Diagnosis not present

## 2015-12-08 DIAGNOSIS — L820 Inflamed seborrheic keratosis: Secondary | ICD-10-CM | POA: Diagnosis not present

## 2015-12-23 DIAGNOSIS — E539 Vitamin B deficiency, unspecified: Secondary | ICD-10-CM | POA: Diagnosis not present

## 2016-01-10 DIAGNOSIS — J069 Acute upper respiratory infection, unspecified: Secondary | ICD-10-CM | POA: Diagnosis not present

## 2016-01-10 DIAGNOSIS — Z636 Dependent relative needing care at home: Secondary | ICD-10-CM | POA: Diagnosis not present

## 2016-01-13 ENCOUNTER — Ambulatory Visit: Payer: PPO

## 2016-01-17 DIAGNOSIS — E042 Nontoxic multinodular goiter: Secondary | ICD-10-CM | POA: Diagnosis not present

## 2016-01-17 DIAGNOSIS — Z5181 Encounter for therapeutic drug level monitoring: Secondary | ICD-10-CM | POA: Diagnosis not present

## 2016-01-17 DIAGNOSIS — E1140 Type 2 diabetes mellitus with diabetic neuropathy, unspecified: Secondary | ICD-10-CM | POA: Diagnosis not present

## 2016-01-17 DIAGNOSIS — Z808 Family history of malignant neoplasm of other organs or systems: Secondary | ICD-10-CM | POA: Diagnosis not present

## 2016-01-17 DIAGNOSIS — M810 Age-related osteoporosis without current pathological fracture: Secondary | ICD-10-CM | POA: Diagnosis not present

## 2016-01-17 DIAGNOSIS — E113293 Type 2 diabetes mellitus with mild nonproliferative diabetic retinopathy without macular edema, bilateral: Secondary | ICD-10-CM | POA: Diagnosis not present

## 2016-01-17 DIAGNOSIS — E781 Pure hyperglyceridemia: Secondary | ICD-10-CM | POA: Diagnosis not present

## 2016-01-17 DIAGNOSIS — E559 Vitamin D deficiency, unspecified: Secondary | ICD-10-CM | POA: Diagnosis not present

## 2016-01-17 DIAGNOSIS — Z7984 Long term (current) use of oral hypoglycemic drugs: Secondary | ICD-10-CM | POA: Diagnosis not present

## 2016-01-17 DIAGNOSIS — Z79899 Other long term (current) drug therapy: Secondary | ICD-10-CM | POA: Diagnosis not present

## 2016-01-19 DIAGNOSIS — F329 Major depressive disorder, single episode, unspecified: Secondary | ICD-10-CM | POA: Diagnosis not present

## 2016-01-19 DIAGNOSIS — E538 Deficiency of other specified B group vitamins: Secondary | ICD-10-CM | POA: Diagnosis not present

## 2016-01-19 DIAGNOSIS — M810 Age-related osteoporosis without current pathological fracture: Secondary | ICD-10-CM | POA: Diagnosis not present

## 2016-01-19 DIAGNOSIS — E781 Pure hyperglyceridemia: Secondary | ICD-10-CM | POA: Diagnosis not present

## 2016-01-19 DIAGNOSIS — K219 Gastro-esophageal reflux disease without esophagitis: Secondary | ICD-10-CM | POA: Diagnosis not present

## 2016-01-19 DIAGNOSIS — G2581 Restless legs syndrome: Secondary | ICD-10-CM | POA: Diagnosis not present

## 2016-01-19 DIAGNOSIS — I10 Essential (primary) hypertension: Secondary | ICD-10-CM | POA: Diagnosis not present

## 2016-01-19 DIAGNOSIS — I471 Supraventricular tachycardia: Secondary | ICD-10-CM | POA: Diagnosis not present

## 2016-01-20 LAB — LIPID PANEL
Cholesterol: 151 (ref 0–200)
HDL: 27 — AB (ref 35–70)
LDL Cholesterol: 82
Triglycerides: 438 — AB (ref 40–160)

## 2016-01-20 LAB — BASIC METABOLIC PANEL
BUN: 18 (ref 4–21)
Creatinine: 0.9 (ref <=1.1)
Glucose: 187
Potassium: 4.3 (ref 3.4–5.3)
Sodium: 136 — AB (ref 137–147)

## 2016-01-20 LAB — HEPATIC FUNCTION PANEL
ALT: 28 (ref 7–35)
AST: 23 (ref 13–35)

## 2016-01-20 LAB — HEMOGLOBIN A1C: Hemoglobin A1C: 7.7

## 2016-01-20 LAB — MICROALBUMIN, URINE: Microalb, Ur: 118.07

## 2016-02-15 ENCOUNTER — Ambulatory Visit
Admission: RE | Admit: 2016-02-15 | Discharge: 2016-02-15 | Disposition: A | Discharge status: Home / Self Care | Payer: PPO | Source: Ambulatory Visit | Attending: Family Medicine | Admitting: Family Medicine

## 2016-02-15 DIAGNOSIS — Z1231 Encounter for screening mammogram for malignant neoplasm of breast: Secondary | ICD-10-CM

## 2016-02-18 DIAGNOSIS — E538 Deficiency of other specified B group vitamins: Secondary | ICD-10-CM | POA: Diagnosis not present

## 2016-02-18 DIAGNOSIS — M722 Plantar fascial fibromatosis: Secondary | ICD-10-CM | POA: Diagnosis not present

## 2016-02-18 DIAGNOSIS — J019 Acute sinusitis, unspecified: Secondary | ICD-10-CM | POA: Diagnosis not present

## 2016-02-18 DIAGNOSIS — B9689 Other specified bacterial agents as the cause of diseases classified elsewhere: Secondary | ICD-10-CM | POA: Diagnosis not present

## 2016-02-22 DIAGNOSIS — D485 Neoplasm of uncertain behavior of skin: Secondary | ICD-10-CM | POA: Diagnosis not present

## 2016-02-22 DIAGNOSIS — M65872 Other synovitis and tenosynovitis, left ankle and foot: Secondary | ICD-10-CM | POA: Diagnosis not present

## 2016-02-22 DIAGNOSIS — M722 Plantar fascial fibromatosis: Secondary | ICD-10-CM | POA: Diagnosis not present

## 2016-02-22 DIAGNOSIS — M792 Neuralgia and neuritis, unspecified: Secondary | ICD-10-CM | POA: Diagnosis not present

## 2016-02-29 DIAGNOSIS — M722 Plantar fascial fibromatosis: Secondary | ICD-10-CM | POA: Diagnosis not present

## 2016-02-29 DIAGNOSIS — M7662 Achilles tendinitis, left leg: Secondary | ICD-10-CM | POA: Diagnosis not present

## 2016-02-29 DIAGNOSIS — M792 Neuralgia and neuritis, unspecified: Secondary | ICD-10-CM | POA: Diagnosis not present

## 2016-03-16 ENCOUNTER — Ambulatory Visit (HOSPITAL_COMMUNITY)
Admission: RE | Admit: 2016-03-16 | Discharge: 2016-03-16 | Disposition: A | Discharge status: Home / Self Care | Payer: PPO | Source: Ambulatory Visit | Attending: Internal Medicine | Admitting: Internal Medicine

## 2016-03-16 ENCOUNTER — Ambulatory Visit (INDEPENDENT_AMBULATORY_CARE_PROVIDER_SITE_OTHER): Payer: PPO | Admitting: Internal Medicine

## 2016-03-16 VITALS — BP 152/73 | HR 78 | Temp 97.5°F | Ht 63.0 in | Wt 202.3 lb

## 2016-03-16 DIAGNOSIS — Z9689 Presence of other specified functional implants: Secondary | ICD-10-CM | POA: Insufficient documentation

## 2016-03-16 DIAGNOSIS — M79672 Pain in left foot: Secondary | ICD-10-CM | POA: Diagnosis not present

## 2016-03-16 DIAGNOSIS — M797 Fibromyalgia: Secondary | ICD-10-CM | POA: Diagnosis not present

## 2016-03-16 DIAGNOSIS — Z79899 Other long term (current) drug therapy: Secondary | ICD-10-CM

## 2016-03-16 DIAGNOSIS — M7732 Calcaneal spur, left foot: Secondary | ICD-10-CM | POA: Insufficient documentation

## 2016-03-16 DIAGNOSIS — R05 Cough: Secondary | ICD-10-CM | POA: Insufficient documentation

## 2016-03-16 DIAGNOSIS — F330 Major depressive disorder, recurrent, mild: Secondary | ICD-10-CM

## 2016-03-16 DIAGNOSIS — F418 Other specified anxiety disorders: Secondary | ICD-10-CM | POA: Diagnosis not present

## 2016-03-16 DIAGNOSIS — I10 Essential (primary) hypertension: Secondary | ICD-10-CM | POA: Diagnosis not present

## 2016-03-16 DIAGNOSIS — J9811 Atelectasis: Secondary | ICD-10-CM | POA: Insufficient documentation

## 2016-03-16 DIAGNOSIS — R053 Chronic cough: Secondary | ICD-10-CM

## 2016-03-16 MED ORDER — SERTRALINE HCL 50 MG PO TABS: 50.0000 mg | ORAL_TABLET | Freq: Every day | ORAL | 2 refills | Status: DC

## 2016-03-16 MED ORDER — GUAIFENESIN-DM 100-10 MG/5ML PO SYRP: 5.0000 mL | ORAL_SOLUTION | ORAL | 0 refills | Status: DC | PRN

## 2016-03-16 MED ORDER — PREDNISONE 20 MG PO TABS: 40.0000 mg | ORAL_TABLET | Freq: Every day | ORAL | 0 refills | Status: AC

## 2016-03-16 NOTE — Assessment & Plan Note (Signed)
Patient reports feeling quite depressed and frustrated with multiple areas of her life. She is depressed and frustrated with her husbands rapidly declining health and being his primary care taker while she is not feeling well herself. She has been on Zoloft before temporarily and had significant improvement of her symptoms during a particularly rough period.  -Start Zoloft 50 mg daily -Follow up 1 month

## 2016-03-16 NOTE — Assessment & Plan Note (Addendum)
Suspect viral upper respiratory illness that has not yet cleared however differential includes bronchitis vs pneumonia as well. Lungs clear however will order CXR for evaluation and will treat depending on results.  -CXR -Prednisone 40 mg x 5 days -Robitussin DM for cough -Supportive therapy with tylenol, ibuprofen, increased fluids and rest

## 2016-03-16 NOTE — Assessment & Plan Note (Signed)
Compliant with Toprol XL 25 mg daily and ramipril 10 mg daily as well. Pressure elevated today here in clinic and patient attributes this to anxiety and stress from current condition. Will recheck at next visit to ensure proper control.

## 2016-03-16 NOTE — Progress Notes (Signed)
   CC: persistent cough, depression  HPI:  [name] is a 76 y.o. female well-known to me with medical history as outlined below who presents for evaluation of cough.   Cough: Describes a 2 month history of cough and congestion. Initially was improving on her own however symptoms had worsened and she reportedly was started on an antibiotic without significant improvement of her symptoms. Since that time her symptoms have been progressing to include chest congestion, nagging cough that keeps her awake and generalized malaise. She also endorsed occasional bilateral ear pain as well. Has tried OTC Mucinex however noticed no improvement.   Depression and Anxiety: [name] reports she has a lot of stress at home. Reports her husbands health has declined significantly over the past 2 months and that he is very weak and barely able to walk without assistance. In addition, he developed renal failure which requires hemodialysis. She is the primary caretaker of her husband and feels distraught over their current situation. (Husband refuses to go to ED however has an appt here tomorrow). Has been on zoloft in the past with significant improvement of her symptoms.  Fibromyalgia: Has been on Cymbalta for many many years. Notes worsening of her pain and increasing fatigue over the past few months.   Past Medical History:  Diagnosis Date  . Anxiety   . Arthritis   . Depression   . Essential hypertension   . Fibromyalgia   . Hyperlipemia   . PSVT (paroxysmal supraventricular tachycardia) ([place])   . Reflux   . Restless leg syndrome   . Type 2 diabetes mellitus ([place])     Review of Systems:  Review of Systems  Constitutional: Positive for malaise/fatigue. Negative for chills, fever and weight loss.  HENT: Positive for congestion, ear pain, sinus pain and sore throat.   Respiratory: Positive for cough and wheezing. Negative for sputum production.   Gastrointestinal: Negative for abdominal pain,  diarrhea, nausea and vomiting.  Musculoskeletal: Positive for myalgias. Negative for falls.  Neurological: Positive for weakness.  Psychiatric/Behavioral: Positive for depression. Negative for suicidal ideas. The patient is nervous/anxious.     Physical Exam: Physical Exam  Constitutional:  Anxious and depressed.   HENT:  Head: Normocephalic and atraumatic.  Mouth/Throat: Oropharynx is clear and moist. No oropharyngeal exudate.  Eyes: Right eye exhibits no discharge. Left eye exhibits no discharge. No scleral icterus.  Cardiovascular: Normal rate, regular rhythm and normal heart sounds.   Pulmonary/Chest: Effort normal. No respiratory distress.  Abdominal: Soft. Bowel sounds are normal. She exhibits no distension.  Musculoskeletal: She exhibits tenderness (diffuse msk tenderness throughout. no gross deformity. ).  Skin: Skin is warm and dry. She is not diaphoretic. No erythema.  Psychiatric:  Depressed with labile moods.     Vitals:   03/16/16 1416  BP: (!) 152/73  Pulse: 78  Temp: 97.5 F (36.4 C)  TempSrc: Oral  SpO2: 97%  Weight: 202 lb 4.8 oz (91.8 kg)  Height: 5\' 3"  (1.6 m)    Assessment & Plan:   See Encounters Tab for problem based charting.  Patient seen with Dr. Beryle Beams

## 2016-03-16 NOTE — Assessment & Plan Note (Signed)
Patient reports worsening fatigue, weakness and diffuse body pain, consistent with her fibro flares. Has been on Cymbalta for many years and reports frustration that this condition is not yet controlled.  I suspect her worsening fibromyalgia presentation is due to her overall increased stress levels, depression and acute illness. Would favor optimizing the above before starting any sort of chronic pain regimen.  -Cymbalta 60 mg -- treat modifiable triggers

## 2016-03-16 NOTE — Progress Notes (Signed)
Medicine attending: Medical history, presenting problems, physical findings, and medications, reviewed with resident physician Dr Bethany Molt on the day of the patient visit and I concur with her evaluation and management plan. 

## 2016-03-16 NOTE — Patient Instructions (Addendum)
It was a pleasure seeing you again! I'm sorry you are not feeling well.   1. Today we talked about your persistent cough and not feeling well. For this I am getting a Chest X-ray to make sure there is nothing going on in your lungs. I am also prescribing you a steroid to help relax your lungs. I have also written a cough syrup as well.  2. I will wait on the results of the x-ray before prescribing antibiotics. 3. Please continue to drink fluids, take Tylenol or Ibuprofen.  4. For your low mood secondary to all the stressful things you have going on in your life, I have started a medication called Zoloft 50 mg. Please take one daily.

## 2016-03-17 ENCOUNTER — Other Ambulatory Visit: Payer: Self-pay | Admitting: Internal Medicine

## 2016-03-17 ENCOUNTER — Telehealth: Payer: Self-pay

## 2016-03-17 DIAGNOSIS — E785 Hyperlipidemia, unspecified: Secondary | ICD-10-CM | POA: Diagnosis not present

## 2016-03-17 DIAGNOSIS — I10 Essential (primary) hypertension: Secondary | ICD-10-CM | POA: Diagnosis not present

## 2016-03-17 DIAGNOSIS — I471 Supraventricular tachycardia: Secondary | ICD-10-CM | POA: Diagnosis not present

## 2016-03-17 DIAGNOSIS — E668 Other obesity: Secondary | ICD-10-CM | POA: Diagnosis not present

## 2016-03-17 DIAGNOSIS — I2510 Atherosclerotic heart disease of native coronary artery without angina pectoris: Secondary | ICD-10-CM | POA: Diagnosis not present

## 2016-03-17 DIAGNOSIS — E1140 Type 2 diabetes mellitus with diabetic neuropathy, unspecified: Secondary | ICD-10-CM | POA: Diagnosis not present

## 2016-03-17 LAB — CBC WITH DIFFERENTIAL/PLATELET
Basophils Absolute: 0 10*3/uL (ref 0.0–0.2)
Basos: 0 %
EOS (ABSOLUTE): 0.2 10*3/uL (ref 0.0–0.4)
Eos: 3 %
Hematocrit: 40.4 % (ref 34.0–46.6)
Hemoglobin: 13.4 g/dL (ref 11.1–15.9)
Immature Grans (Abs): 0 10*3/uL (ref 0.0–0.1)
Immature Granulocytes: 1 %
Lymphocytes Absolute: 3.6 10*3/uL — ABNORMAL HIGH (ref 0.7–3.1)
Lymphs: 59 %
MCH: 29.6 pg (ref 26.6–33.0)
MCHC: 33.2 g/dL (ref 31.5–35.7)
MCV: 89 fL (ref 79–97)
Monocytes Absolute: 0.4 10*3/uL (ref 0.1–0.9)
Monocytes: 6 %
Neutrophils Absolute: 1.8 10*3/uL (ref 1.4–7.0)
Neutrophils: 31 %
Platelets: 235 10*3/uL (ref 150–379)
RBC: 4.53 x10E6/uL (ref 3.77–5.28)
RDW: 14 % (ref 12.3–15.4)
WBC: 6 10*3/uL (ref 3.4–10.8)

## 2016-03-17 MED ORDER — AZITHROMYCIN 250 MG PO TABS: ORAL_TABLET | ORAL | 0 refills | Status: AC

## 2016-03-17 NOTE — Telephone Encounter (Signed)
Informed pt .

## 2016-03-17 NOTE — Telephone Encounter (Signed)
Hello Bonnita Nasuti! If you could please let the patient know I am sending in Azithromycin for her.

## 2016-03-17 NOTE — Telephone Encounter (Signed)
Pt want to reminder Dr. Danford Bad to called her antibiotic into the pharmacy. Please call pt back.

## 2016-03-27 ENCOUNTER — Ambulatory Visit (INDEPENDENT_AMBULATORY_CARE_PROVIDER_SITE_OTHER): Payer: PPO | Admitting: Pulmonary Disease

## 2016-03-27 VITALS — BP 142/70 | HR 75 | Temp 97.5°F | Ht 63.0 in | Wt 203.8 lb

## 2016-03-27 DIAGNOSIS — K219 Gastro-esophageal reflux disease without esophagitis: Secondary | ICD-10-CM

## 2016-03-27 DIAGNOSIS — J309 Allergic rhinitis, unspecified: Secondary | ICD-10-CM

## 2016-03-27 DIAGNOSIS — E538 Deficiency of other specified B group vitamins: Secondary | ICD-10-CM

## 2016-03-27 DIAGNOSIS — M797 Fibromyalgia: Secondary | ICD-10-CM

## 2016-03-27 DIAGNOSIS — I10 Essential (primary) hypertension: Secondary | ICD-10-CM

## 2016-03-27 DIAGNOSIS — Z79899 Other long term (current) drug therapy: Secondary | ICD-10-CM

## 2016-03-27 DIAGNOSIS — R053 Chronic cough: Secondary | ICD-10-CM

## 2016-03-27 DIAGNOSIS — F330 Major depressive disorder, recurrent, mild: Secondary | ICD-10-CM

## 2016-03-27 DIAGNOSIS — R05 Cough: Secondary | ICD-10-CM | POA: Diagnosis not present

## 2016-03-27 MED ORDER — DULOXETINE HCL 20 MG PO CPEP: ORAL_CAPSULE | ORAL | 0 refills | Status: DC

## 2016-03-27 MED ORDER — LORATADINE 10 MG PO TABS: 10.0000 mg | ORAL_TABLET | Freq: Every day | ORAL | 1 refills | Status: DC

## 2016-03-27 MED ORDER — CYANOCOBALAMIN 1000 MCG/ML IJ SOLN
1000.0000 ug | Freq: Once | INTRAMUSCULAR | Status: AC
  Administered 2016-03-27: 1000 ug via INTRAMUSCULAR

## 2016-03-27 MED ORDER — SERTRALINE HCL 50 MG PO TABS: 50.0000 mg | ORAL_TABLET | Freq: Every day | ORAL | 1 refills | Status: DC

## 2016-03-27 MED ORDER — FLUTICASONE PROPIONATE 50 MCG/ACT NA SUSP: 2.0000 | Freq: Every day | NASAL | 2 refills | Status: DC

## 2016-03-27 NOTE — Assessment & Plan Note (Signed)
She feels that Zoloft is helping her fibromyalgia. Would prefer to have her not be on both an SSRI and SNRI. Will taper her Cymbalta. She may increase her Zoloft to 100mg  daily in 1-2 weeks if she feels she needs better control

## 2016-03-27 NOTE — Progress Notes (Signed)
   CC: cough  HPI:  [name] is a 76 y.o. woman with history as noted below presenting with cough.  She was seen in clinic 03/16/2016. She was prescribed prednisone and azithromycin. She still has cough. It has been going on since December 24. She has nasal congestion. She uses a saline spray. The mucous is white. No blood. She does have heartburn and acid reflux which is controlled by her omeprazole.    Past Medical History:  Diagnosis Date  . Anxiety   . Arthritis   . Depression   . Essential hypertension   . Fibromyalgia   . Hyperlipemia   . PSVT (paroxysmal supraventricular tachycardia) ([place])   . Reflux   . Restless leg syndrome   . Type 2 diabetes mellitus (HCC)     Review of Systems:   No chest pain No diarrhea  Physical Exam:  Vitals:   03/27/16 1447 03/27/16 1525  BP: (!) 160/78 (!) 142/70  Pulse: 69 75  Temp: 97.5 F (36.4 C)   TempSrc: Oral   SpO2: 100%   Weight: 203 lb 12.8 oz (92.4 kg)   Height: 5\' 3"  (1.6 m)    General Apperance: NAD HEENT: Normocephalic, atraumatic, anicteric sclera Neck: Supple, trachea midline Lungs: Clear to auscultation bilaterally. No wheezes, rhonchi or rales. Breathing comfortably Heart: Regular rate and rhythm, no murmur/rub/gallop Abdomen: Soft, nontender, nondistended, no rebound/guarding Extremities: Warm and well perfused, no edema Skin: No rashes or lesions Neurologic: Alert and interactive. No gross deficits.  Assessment & Plan:   See Encounters Tab for problem based charting.  Patient discussed with Dr. Lynnae January

## 2016-03-27 NOTE — Patient Instructions (Addendum)
Cymbalta: Take 2 capsules (40 mg total) by mouth daily for 2 weeks then decrease to 1 capsule (20 mg total) by mouth daily for 2 weeks. Stop taking cymbalta after this.  You may increase your Zoloft to two tablets daily in 1-2 weeks.  Take loratidine daily Use Flonase nasal spray daily Do saline rinses daily  Follow up in 6 weeks

## 2016-03-27 NOTE — Assessment & Plan Note (Signed)
She gets monthly injections. Will administer today. At follow up recheck vitamin b12 levels.

## 2016-03-27 NOTE — Assessment & Plan Note (Addendum)
Assessment: BP initially 160/78 and on repeat 142/70  Plan: Cont metoprolol succinate 25mg  daily, ramipril 10mg  daily, verapamil 240mg  daily.  She will work on diet control

## 2016-03-27 NOTE — Assessment & Plan Note (Deleted)
Assessment: Chronic cough that has not responded to abx and PO prednisone. She is already on PPI for GERD. Will treat for allergic rhinitis/post nasal drip  Plan: Flonase daily Claritin daily Saline rinses Follow up in 4-6 weeks

## 2016-03-27 NOTE — Assessment & Plan Note (Signed)
Assessment: Chronic cough that has not responded to abx and PO prednisone. She is already on PPI for GERD. Will treat for allergic rhinitis/post nasal drip  Plan: Flonase daily Claritin daily Saline rinses Follow up in 4-6 weeks

## 2016-03-31 NOTE — Progress Notes (Signed)
Internal Medicine Clinic Attending  Case discussed with Dr. Krall soon after the resident saw the patient.  We reviewed the resident's history and exam and pertinent patient test results.  I agree with the assessment, diagnosis, and plan of care documented in the resident's note. 

## 2016-04-11 ENCOUNTER — Telehealth: Payer: Self-pay | Admitting: *Deleted

## 2016-04-11 NOTE — Telephone Encounter (Signed)
Pt had called and spoken w/ hme, wanted a B12 injection, became upset and hung up, attempted to call pt back, no answer

## 2016-04-12 DIAGNOSIS — M9904 Segmental and somatic dysfunction of sacral region: Secondary | ICD-10-CM | POA: Diagnosis not present

## 2016-04-12 DIAGNOSIS — M9902 Segmental and somatic dysfunction of thoracic region: Secondary | ICD-10-CM | POA: Diagnosis not present

## 2016-04-12 DIAGNOSIS — M5430 Sciatica, unspecified side: Secondary | ICD-10-CM | POA: Diagnosis not present

## 2016-04-12 DIAGNOSIS — M9903 Segmental and somatic dysfunction of lumbar region: Secondary | ICD-10-CM | POA: Diagnosis not present

## 2016-04-13 ENCOUNTER — Ambulatory Visit (INDEPENDENT_AMBULATORY_CARE_PROVIDER_SITE_OTHER): Payer: PPO

## 2016-04-13 ENCOUNTER — Ambulatory Visit (INDEPENDENT_AMBULATORY_CARE_PROVIDER_SITE_OTHER): Payer: PPO | Admitting: Podiatry

## 2016-04-13 ENCOUNTER — Other Ambulatory Visit: Payer: Self-pay | Admitting: *Deleted

## 2016-04-13 ENCOUNTER — Encounter: Payer: Self-pay | Admitting: *Deleted

## 2016-04-13 DIAGNOSIS — M722 Plantar fascial fibromatosis: Secondary | ICD-10-CM

## 2016-04-13 NOTE — Patient Instructions (Signed)

## 2016-04-13 NOTE — Progress Notes (Signed)
   Subjective:    Patient ID: [name], female    DOB: December 20, 1940, 76 y.o.   MRN: [id number]  HPI: She presents today with a chief complaint of pain to the left heel times a past several months states that she had plantar fasciitis in the right heel several years ago had multiple injections and the only injection that really work was the one that went through the bottom of her foot. She is also diabetic Ace that she is in good control with hemoglobin A1c was 7.0.    Review of Systems  Constitutional: Positive for diaphoresis and fatigue.  HENT: Positive for sinus pain and sinus pressure.   Eyes: Positive for pain, redness and itching.  Respiratory: Positive for shortness of breath.   Endocrine: Positive for polydipsia.  Musculoskeletal: Positive for arthralgias, back pain, gait problem and myalgias.  Neurological: Positive for weakness.  All other systems reviewed and are negative.      Objective:   Physical Exam: Vital signs are stable alert and oriented 3. Pulses are palpable. Neurologic sensorium is intact. Deep tendon reflexes are intact. Muscle strength was 5 over 5 dorsiflexion plantar flexors and inverters everters onto the musculature is intact. Orthopedic evaluation and states all joints distal to the ankle full range of motion without crepitation. She has pain with palpation medical Kenner tubercle of the left heel. Radiographs taken today bilateral foot do not demonstrate any type of osseus arm allergies other than soft tissue increase in density of the plantar fascia calcaneal insertion site of the left heel. Plantar distally oriented cranial spurs are present. No open lesions are noted.        Assessment & Plan:  Diabetes and plantar fasciitis left.  Plan: I injected plantarly today into the left heel with Kenalog and local anesthetic. Provided her with a ankle splint and a night splint discussed appropriate shoe gear stretching excise ice therapy and shoe  modifications. Follow-up with her in 1 month.

## 2016-05-10 DIAGNOSIS — M9903 Segmental and somatic dysfunction of lumbar region: Secondary | ICD-10-CM | POA: Diagnosis not present

## 2016-05-10 DIAGNOSIS — E538 Deficiency of other specified B group vitamins: Secondary | ICD-10-CM | POA: Diagnosis not present

## 2016-05-10 DIAGNOSIS — M5430 Sciatica, unspecified side: Secondary | ICD-10-CM | POA: Diagnosis not present

## 2016-05-10 DIAGNOSIS — M9904 Segmental and somatic dysfunction of sacral region: Secondary | ICD-10-CM | POA: Diagnosis not present

## 2016-05-10 DIAGNOSIS — M9902 Segmental and somatic dysfunction of thoracic region: Secondary | ICD-10-CM | POA: Diagnosis not present

## 2016-05-11 ENCOUNTER — Ambulatory Visit: Payer: PPO | Admitting: Podiatry

## 2016-05-12 ENCOUNTER — Ambulatory Visit: Payer: PPO | Admitting: Podiatry

## 2016-05-15 DIAGNOSIS — M9904 Segmental and somatic dysfunction of sacral region: Secondary | ICD-10-CM | POA: Diagnosis not present

## 2016-05-15 DIAGNOSIS — M9902 Segmental and somatic dysfunction of thoracic region: Secondary | ICD-10-CM | POA: Diagnosis not present

## 2016-05-15 DIAGNOSIS — M5430 Sciatica, unspecified side: Secondary | ICD-10-CM | POA: Diagnosis not present

## 2016-05-15 DIAGNOSIS — M9903 Segmental and somatic dysfunction of lumbar region: Secondary | ICD-10-CM | POA: Diagnosis not present

## 2016-05-16 DIAGNOSIS — H35033 Hypertensive retinopathy, bilateral: Secondary | ICD-10-CM | POA: Diagnosis not present

## 2016-05-16 DIAGNOSIS — H524 Presbyopia: Secondary | ICD-10-CM | POA: Diagnosis not present

## 2016-05-17 ENCOUNTER — Encounter: Payer: Self-pay | Admitting: Internal Medicine

## 2016-05-17 ENCOUNTER — Ambulatory Visit (INDEPENDENT_AMBULATORY_CARE_PROVIDER_SITE_OTHER): Payer: PPO | Admitting: Internal Medicine

## 2016-05-17 VITALS — BP 142/60 | HR 67 | Temp 97.8°F | Ht 63.0 in | Wt 204.5 lb

## 2016-05-17 DIAGNOSIS — Z79899 Other long term (current) drug therapy: Secondary | ICD-10-CM

## 2016-05-17 DIAGNOSIS — I10 Essential (primary) hypertension: Secondary | ICD-10-CM

## 2016-05-17 MED ORDER — VERAPAMIL HCL ER 240 MG PO CP24: 240.0000 mg | ORAL_CAPSULE | Freq: Every day | ORAL | 2 refills | Status: DC

## 2016-05-17 NOTE — Patient Instructions (Signed)
General Instructions: - Record blood pressures on log for next 1 week - Only take blood pressure in morning - Bring your home blood pressure machine with you so we can compare to ours - Your blood pressure is doing great here! - Try to do some relaxing activities such as sewing, going for a walk, reading, coloring books, etc - Follow up in 1 week  Thank you for bringing your medicines today. This helps [place] keep you safe from mistakes.   Progress Toward Treatment Goals:  No flowsheet data found.  Self Care Goals & Plans:  No flowsheet data found.  No flowsheet data found.   Care Management & Community Referrals:  No flowsheet data found.

## 2016-05-17 NOTE — Progress Notes (Signed)
   CC: High blood pressure  HPI:  [name] is a 76 y.o. woman with past medical history as noted below who presents today for follow-up of her hypertension.  She reports her blood pressure has been running significantly higher than usual over the last few days. She reports she went to her eye doctor a few days ago and her blood pressure was 190/99 at that visit. She has since rechecked her blood pressures at home with readings of 209/99, 174/89, and 170/85. The 170/85 reading was this morning. She uses an electronic pressure machine at home. Her blood pressure today is 142/60. She states her normal blood pressure is in the 347Q systolic. She is taking verapamil ER 240 mg daily, metoprolol ER 25 mg daily, and ramipril 10 mg daily. She denies any chest pain, shortness of breath, or headaches. She does note yesterday she had some left-sided neck squeezing but this only lasted a few seconds. She states she is a very anxious person and her elevated blood pressures have been stressing her out. She also notes her husband just passed away last month and has been significantly upsetting to her. Additionally, she reports her daughter is a Marine scientist and encouraged her to follow up with her doctor in reference to her blood pressure. She states her family has been "driving me nuts" since the death of her husband and this has been stressful as well.  Past Medical History:  Diagnosis Date  . Anxiety   . Arthritis   . Depression   . Essential hypertension   . Fibromyalgia   . Hyperlipemia   . PSVT (paroxysmal supraventricular tachycardia) ([place])   . Reflux   . Restless leg syndrome   . Type 2 diabetes mellitus (H[facility])     Review of Systems:   General: Denies fever, chills, night sweats, changes in weight, changes in appetite HEENT: Denies ear pain, changes in vision, rhinorrhea, sore throat CV: Denies palpitations, orthopnea Pulm: Denies cough, wheezing GI: Denies abdominal pain, nausea, vomiting,  diarrhea, constipation, melena, hematochezia GU: Denies dysuria, hematuria, frequency Msk: Denies muscle cramps, joint pains Neuro: Denies weakness, numbness, tingling Skin: Denies rashes, bruising Psych: Denies depression, hallucinations  Physical Exam:  Vitals:   05/17/16 1025  BP: (!) 142/60  Pulse: 67  Temp: 97.8 F (36.6 C)  TempSrc: Oral  SpO2: 100%  Weight: 204 lb 8 oz (92.8 kg)  Height: 5\' 3"  (1.6 m)   General: Well-nourished elderly woman who appears anxious CV: RRR, no m/g/r Pulm: CTA bilaterally, breaths non-labored Ext: warm, no peripheral edema   Assessment & Plan:   See Encounters Tab for problem based charting.  Patient discussed with Dr. Eppie Gibson

## 2016-05-17 NOTE — Assessment & Plan Note (Signed)
Her blood pressure is well-controlled here in the office today. Recommended that she log her blood pressure over the next week. She will bring in her home blood pressure monitor so that it can be compared to our blood pressure monitors here. Her elevated blood pressures at home are likely in part due to anxiety and the recent death of her husband. Advised to follow a low-sodium diet and to find activities that relax her. Continue her current regimen. Will have her follow-up in 1 week to reassess her blood pressure.

## 2016-05-18 DIAGNOSIS — M9903 Segmental and somatic dysfunction of lumbar region: Secondary | ICD-10-CM | POA: Diagnosis not present

## 2016-05-18 DIAGNOSIS — M5430 Sciatica, unspecified side: Secondary | ICD-10-CM | POA: Diagnosis not present

## 2016-05-18 DIAGNOSIS — M9902 Segmental and somatic dysfunction of thoracic region: Secondary | ICD-10-CM | POA: Diagnosis not present

## 2016-05-18 DIAGNOSIS — M9904 Segmental and somatic dysfunction of sacral region: Secondary | ICD-10-CM | POA: Diagnosis not present

## 2016-05-18 NOTE — Progress Notes (Signed)
Case discussed with Dr. Rivet soon after the resident saw the patient. We reviewed the resident's history and exam and pertinent patient test results. I agree with the assessment, diagnosis, and plan of care documented in the resident's note. 

## 2016-05-24 ENCOUNTER — Ambulatory Visit: Payer: PPO

## 2016-05-30 DIAGNOSIS — E668 Other obesity: Secondary | ICD-10-CM | POA: Diagnosis not present

## 2016-05-30 DIAGNOSIS — I10 Essential (primary) hypertension: Secondary | ICD-10-CM | POA: Diagnosis not present

## 2016-05-30 DIAGNOSIS — E785 Hyperlipidemia, unspecified: Secondary | ICD-10-CM | POA: Diagnosis not present

## 2016-05-30 DIAGNOSIS — I471 Supraventricular tachycardia: Secondary | ICD-10-CM | POA: Diagnosis not present

## 2016-05-30 DIAGNOSIS — I2510 Atherosclerotic heart disease of native coronary artery without angina pectoris: Secondary | ICD-10-CM | POA: Diagnosis not present

## 2016-05-30 DIAGNOSIS — E1140 Type 2 diabetes mellitus with diabetic neuropathy, unspecified: Secondary | ICD-10-CM | POA: Diagnosis not present

## 2016-06-01 ENCOUNTER — Encounter: Payer: Self-pay | Admitting: Family Medicine

## 2016-06-01 ENCOUNTER — Ambulatory Visit (INDEPENDENT_AMBULATORY_CARE_PROVIDER_SITE_OTHER): Payer: PPO | Admitting: Family Medicine

## 2016-06-01 VITALS — BP 140/80 | HR 65 | Resp 12 | Ht 63.0 in | Wt 205.2 lb

## 2016-06-01 DIAGNOSIS — M797 Fibromyalgia: Secondary | ICD-10-CM | POA: Diagnosis not present

## 2016-06-01 DIAGNOSIS — E66812 Obesity, class 2: Secondary | ICD-10-CM

## 2016-06-01 DIAGNOSIS — Z6836 Body mass index (BMI) 36.0-36.9, adult: Secondary | ICD-10-CM

## 2016-06-01 DIAGNOSIS — E1149 Type 2 diabetes mellitus with other diabetic neurological complication: Secondary | ICD-10-CM

## 2016-06-01 DIAGNOSIS — E559 Vitamin D deficiency, unspecified: Secondary | ICD-10-CM

## 2016-06-01 DIAGNOSIS — M5410 Radiculopathy, site unspecified: Secondary | ICD-10-CM | POA: Diagnosis not present

## 2016-06-01 DIAGNOSIS — I10 Essential (primary) hypertension: Secondary | ICD-10-CM

## 2016-06-01 DIAGNOSIS — Z794 Long term (current) use of insulin: Secondary | ICD-10-CM | POA: Diagnosis not present

## 2016-06-01 DIAGNOSIS — F330 Major depressive disorder, recurrent, mild: Secondary | ICD-10-CM

## 2016-06-01 MED ORDER — SERTRALINE HCL 50 MG PO TABS: 50.0000 mg | ORAL_TABLET | Freq: Every day | ORAL | 1 refills | Status: DC

## 2016-06-01 NOTE — Patient Instructions (Signed)
A few things to remember from today's visit:   Essential hypertension, benign   Please be sure medication list is accurate. If a new problem present, please set up appointment sooner than planned today.           Prediabetes Eating Plan Prediabetes-also called impaired glucose tolerance or impaired fasting glucose-is a condition that causes blood sugar (blood glucose) levels to be higher than normal. Following a healthy diet can help to keep prediabetes under control. It can also help to lower the risk of type 2 diabetes and heart disease, which are increased in people who have prediabetes. Along with regular exercise, a healthy diet:  Promotes weight loss.  Helps to control blood sugar levels.  Helps to improve the way that the body uses insulin. What do I need to know about this eating plan?  Use the glycemic index (GI) to plan your meals. The index tells you how quickly a food will raise your blood sugar. Choose low-GI foods. These foods take a longer time to raise blood sugar.  Pay close attention to the amount of carbohydrates in the food that you eat. Carbohydrates increase blood sugar levels.  Keep track of how many calories you take in. Eating the right amount of calories will help you to achieve a healthy weight. Losing about 7 percent of your starting weight can help to prevent type 2 diabetes.  You may want to follow a Mediterranean diet. This diet includes a lot of vegetables, lean meats or fish, whole grains, fruits, and healthy oils and fats. What foods can I eat? Grains  Whole grains, such as whole-wheat or whole-grain breads, crackers, cereals, and pasta. Unsweetened oatmeal. Bulgur. Barley. Quinoa. Brown rice. Corn or whole-wheat flour tortillas or taco shells. Vegetables  Lettuce. Spinach. Peas. Beets. Cauliflower. Cabbage. Broccoli. Carrots. Tomatoes. Squash. Eggplant. Herbs. Peppers. Onions. Cucumbers. Brussels sprouts. Fruits  Berries. Bananas. Apples.  Oranges. Grapes. Papaya. Mango. Pomegranate. Kiwi. Grapefruit. Cherries. Meats and Other Protein Sources  Seafood. Lean meats, such as chicken and [place] or lean cuts of pork and beef. Tofu. Eggs. Nuts. Beans. Dairy  Low-fat or fat-free dairy products, such as yogurt, cottage cheese, and cheese. Beverages  Water. Tea. Coffee. Sugar-free or diet soda. Seltzer water. Milk. Milk alternatives, such as soy or almond milk. Condiments  Mustard. Relish. Low-fat, low-sugar ketchup. Low-fat, low-sugar barbecue sauce. Low-fat or fat-free mayonnaise. Sweets and Desserts  Sugar-free or low-fat pudding. Sugar-free or low-fat ice cream and other frozen treats. Fats and Oils  Avocado. Walnuts. Olive oil. The items listed above may not be a complete list of recommended foods or beverages. Contact your dietitian for more options.  What foods are not recommended? Grains  Refined white flour and flour products, such as bread, pasta, snack foods, and cereals. Beverages  Sweetened drinks, such as sweet iced tea and soda. Sweets and Desserts  Baked goods, such as cake, cupcakes, pastries, cookies, and cheesecake. The items listed above may not be a complete list of foods and beverages to avoid. Contact your dietitian for more information.  This information is not intended to replace advice given to you by your health care provider. Make sure you discuss any questions you have with your health care provider. Document Released: 06/16/2014 Document Revised: 07/08/2015 Document Reviewed: 02/25/2014 Elsevier Interactive Patient Education  2017 [employer].

## 2016-06-01 NOTE — Progress Notes (Signed)
Pre visit review using our clinic review tool, if applicable. No additional management support is needed unless otherwise documented below in the visit note. 

## 2016-06-01 NOTE — Progress Notes (Signed)
HPI:   [name] is a 76 y.o. female, who is here today to establish care.  [facility] PCP: [facility]   Chronic medical problems: Depression, DM II, HTN, fibromyalgia,chronic back pain with radiculopathy among some.   Concerns today: Back pain, depression, myalgias, IBS,HLD,and HTN among some.  Hypertension:  Dx a few years ago. Currently on Metoprolol Succinate 25 mg daily,Verapamil 240 mg daily, Ramipril 10 mg, and recently added Spironolactone 25 mg (has not started yet)  Hx of premature atrial tachycardia, well controlled with medication, follows with Dr Chalmers Cater. She is taking medications as instructed, no side effects reported.  She has not noted unusual headache, visual changes, exertional chest pain, dyspnea, or focal weakness. BP 160/80 yesterday. 2 weeks ago BP 209/160.   Lab Results  Component Value Date   CREATININE 0.64 02/16/2015   BUN 20 02/16/2015   NA 138 02/16/2015   K 4.6 02/16/2015   CL 108 02/16/2015   CO2 22 02/16/2015   Depression: She discontinued Zooft 50 mg a couple weeks ago, this was started a couple months ago and Cymbalta was discontinued. According to patient, she has been on Cymbalta for years and he was not longer helping. States that she did not know whether or not she was supposed to continue taking Zoloft.  Her husband died in 20-May-2016 after long illness. She lives alone,her daughter visits frequently. She denies suicidal thoughts.  + Crying spells.  Chronic pain:  C/O severe lower back pain and hurting all over. It seems to be worse in the morning and for the past 2 weeks having burning sensation on anterior aspect of thighs and inner right thigh. She denies changes in urine or bowel continence or saddle anesthesia. Pain is constant, exacerbated by movement and alleviated by rest. She follows with ortho, s/p lumbar and cervical spine surgery. Has appt next Wednesday. According to pt, she had an  abnormal lumbar MRI recently but surgery was not recommended because surgical risk.  She also mentions Hx of left plantar fascitis, s/p steroid injection that did not help.   Vit D deficiency, recently changed from Ergocalciferol 50,000 U weekly to OTC Vit D3 1000 U daily.She wonders if it is Ok to change.  She would like some dietary recommendations in regard to diabetes diet. She has not been consistent with a healthy diet. She is upset because "no salt and not eating out" has been recommended during prior visits. She states that she eats "what and when" she can. She does not exercise regularly either.  She follows with Dr Buddy Duty. Dx with DM II about 10 years ago. Last F/U 01/2016.   Review of Systems  Constitutional: Positive for appetite change and fatigue. Negative for fever.  HENT: Negative for mouth sores, nosebleeds, sore throat and trouble swallowing.   Eyes: Negative for redness and visual disturbance.  Respiratory: Negative for cough, shortness of breath and wheezing.   Cardiovascular: Negative for chest pain, palpitations and leg swelling.  Gastrointestinal: Negative for abdominal pain, nausea and vomiting.       Negative for changes in bowel habits.  Endocrine: Negative for polydipsia, polyphagia and polyuria.  Genitourinary: Negative for decreased urine volume, dysuria and hematuria.  Musculoskeletal: Positive for arthralgias, back pain, myalgias and neck pain. Negative for gait problem.  Skin: Negative for color change and rash.  Neurological: Negative for syncope, weakness and headaches.  Psychiatric/Behavioral: Negative for confusion, hallucinations and suicidal ideas. The patient is nervous/anxious.  Current Outpatient Prescriptions on File Prior to Visit  Medication Sig Dispense Refill  . ALPRAZolam (XANAX) 0.25 MG tablet     . aspirin EC 81 MG tablet Take 81 mg by mouth daily.    . Cranberry 1000 MG CAPS Take 1,000 mg by mouth 2 (two) times daily.    .  digoxin (LANOXIN) 0.125 MG tablet Take 0.125 mg by mouth daily.     Marland Kitchen ezetimibe (ZETIA) 10 MG tablet Take 10 mg by mouth daily.     Marland Kitchen gemfibrozil (LOPID) 600 MG tablet Take 600 mg by mouth 2 (two) times daily.    . insulin glargine (LANTUS) 100 UNIT/ML injection Inject 60 Units into the skin at bedtime.     . metFORMIN (GLUCOPHAGE) 1000 MG tablet Take 1,000 mg by mouth 2 (two) times daily with a meal.     . metoprolol succinate (TOPROL-XL) 25 MG 24 hr tablet Take 25 mg by mouth daily.     . nitroGLYCERIN (NITROSTAT) 0.4 MG SL tablet Place 0.4 mg under the tongue every 5 (five) minutes as needed. As needed chest pain    . ramipril (ALTACE) 10 MG capsule Take 10 mg by mouth daily.    Marland Kitchen RELION INSULIN SYRINGE 1ML/31G 31G X 5/16" 1 ML MISC     . rOPINIRole (REQUIP) 1 MG tablet Take 1 mg by mouth at bedtime.     . verapamil (VERELAN PM) 240 MG 24 hr capsule Take 1 capsule (240 mg total) by mouth daily. 30 capsule 2   No current facility-administered medications on file prior to visit.      Past Medical History:  Diagnosis Date  . Anxiety   . Arthritis   . Depression   . Essential hypertension   . Fibromyalgia   . Hyperlipemia   . PSVT (paroxysmal supraventricular tachycardia) ([place])   . Reflux   . Restless leg syndrome   . Type 2 diabetes mellitus (HCC)    Allergies  Allergen Reactions  . Amoxil [Amoxicillin] Swelling    Lips Has patient had a PCN reaction causing immediate rash, facial/tongue/throat swelling, SOB or lightheadedness with hypotension:YES Has patient had a PCN reaction causing severe rash involving mucus membranes or skin necrosis:No Has patient had a PCN reaction that required hospitalization:No Has patient had a PCN reaction occurring within the last 10 years:Yes. If all of the above answers are "NO", then may proceed with Cephalosporin use.   . Chocolate Hives  . Demerol Nausea And Vomiting  . Lisinopril Itching and Swelling    Family History  Problem Relation  Age of Onset  . Heart failure Mother   . Hyperlipidemia Mother   . Lung cancer Father     Lymphoma  . Hyperlipidemia Maternal Grandmother   . Peripheral vascular disease Maternal Grandmother     Social History   Social History  . Marital status: Married    Spouse name: Glendell Docker  . Number of children: 2  . Years of education: N/A   Social History Main Topics  . Smoking status: Never Smoker  . Smokeless tobacco: Never Used  . Alcohol use No  . Drug use: No  . Sexual activity: Not Asked   Other Topics Concern  . None   Social History Narrative   Lives with husband.  Independent of ADLs.    Vitals:   06/01/16 1505 06/01/16 1550  BP: 138/80 140/80  Pulse: 65   Resp: 12   O2 sat 97% at RA.  Body mass index is  36.36 kg/m.   Physical Exam  Constitutional: She is oriented to person, place, and time. She appears well-developed. No distress.  HENT:  Head: Atraumatic.  Mouth/Throat: Oropharynx is clear and moist and mucous membranes are normal.  Eyes: Conjunctivae and EOM are normal. Pupils are equal, round, and reactive to light.  Cardiovascular: Normal rate and regular rhythm.   No murmur heard. Pulses:      Dorsalis pedis pulses are 2+ on the right side, and 2+ on the left side.  Respiratory: Effort normal and breath sounds normal. No respiratory distress. She exhibits tenderness.  GI: Soft. She exhibits no mass. There is no hepatomegaly. There is no tenderness.  Musculoskeletal: She exhibits no edema.  +Trigger points on upper, mid, and lower back. Also on chest wall, upper and lower extremities. Mild limitation of ROM of cervical spine.  Lymphadenopathy:    She has no cervical adenopathy.  Neurological: She is alert and oriented to person, place, and time. She has normal strength. Coordination and gait normal.  Skin: Skin is warm. No erythema.  Psychiatric: Her mood appears anxious. Her affect is labile. She expresses no suicidal ideation.  Fairly groomed, good eye  contact.      ASSESSMENT AND PLAN:   Kaoru was seen today for establish care.  Diagnoses and all orders for this visit:  Radicular pain of both lower extremities  Seems to be worse for the past 2 weeks. Keep appt with ortho, she is hoping to get epidural injection. Instructed about warning signs. Gabapentin to consider in the future.  Essential hypertension, benign  Today BP adequate. Some side effects of antihypertensive medications she is taking were discussed. BMP in 2 weeks given the fact Spironolactone was recently added. Possible complications of elevated BP discussed. Annual eye examination. BP check in 6 weeks and f/u in 3-4 months.  -     Basic metabolic panel; Future  Fibromyalgia  We discussed physiopathology and prognosis of this problem as well as treatment options. Cymbalta was recently discontinued because he was not helping with depression. Other treatment options gabapentin or Lyrica, which we could consider later on. Low impact exercise and fall precautions.  Mild episode of recurrent major depressive disorder (HCC)  Symptomatic and aggravated most likely by the lost of her husband. She will resume Sertraline 50 mg, explained that this type of medication cannot be discontinue abruptly.  Instructed about warning signs. F/U in 4-6 weeks,before if needed.  -     sertraline (ZOLOFT) 50 MG tablet; Take 1 tablet (50 mg total) by mouth daily. May increase to 2 tablets (100mg  total) by mouth daily in 1-2 weeks  Class 2 severe obesity due to excess calories with serious comorbidity and body mass index (BMI) of 36.0 to 36.9 in adult [facility])  We discussed benefits of wt loss as well as adverse effects of obesity. Consistency with a healthy diet and low impact physical activity as tolerated.   Controlled type 2 diabetes mellitus with other neurologic complication, with long-term current use of insulin ([place])  We discussed some dietary recommendations. No  changes in current management. She will continue following with Dr. Buddy Duty Last eye exam reported: last week.  Vitamin D deficiency  For now she was instructed to continue vitamin D3 1000 units daily. We'll recheck in 3-4 months.     Zariya Minner G. [name], MD  [facility]. [place] office.

## 2016-06-02 ENCOUNTER — Encounter (HOSPITAL_COMMUNITY): Payer: Self-pay | Admitting: Emergency Medicine

## 2016-06-02 ENCOUNTER — Emergency Department (HOSPITAL_COMMUNITY)
Admission: EM | Admit: 2016-06-02 | Discharge: 2016-06-02 | Disposition: A | Discharge status: Home / Self Care | Payer: PPO | Attending: Emergency Medicine | Admitting: Emergency Medicine

## 2016-06-02 DIAGNOSIS — I10 Essential (primary) hypertension: Secondary | ICD-10-CM | POA: Diagnosis not present

## 2016-06-02 DIAGNOSIS — R040 Epistaxis: Secondary | ICD-10-CM | POA: Diagnosis not present

## 2016-06-02 DIAGNOSIS — Z794 Long term (current) use of insulin: Secondary | ICD-10-CM | POA: Insufficient documentation

## 2016-06-02 DIAGNOSIS — E119 Type 2 diabetes mellitus without complications: Secondary | ICD-10-CM | POA: Diagnosis not present

## 2016-06-02 DIAGNOSIS — Z79899 Other long term (current) drug therapy: Secondary | ICD-10-CM | POA: Insufficient documentation

## 2016-06-02 DIAGNOSIS — Z7982 Long term (current) use of aspirin: Secondary | ICD-10-CM | POA: Insufficient documentation

## 2016-06-02 DIAGNOSIS — R58 Hemorrhage, not elsewhere classified: Secondary | ICD-10-CM | POA: Diagnosis not present

## 2016-06-02 LAB — I-STAT CHEM 8, ED
BUN: 25 mg/dL — ABNORMAL HIGH (ref 6–20)
Calcium, Ion: 1.27 mmol/L (ref 1.15–1.40)
Chloride: 103 mmol/L (ref 101–111)
Creatinine, Ser: 0.9 mg/dL (ref 0.44–1.00)
Glucose, Bld: 187 mg/dL — ABNORMAL HIGH (ref 65–99)
HCT: 38 % (ref 36.0–46.0)
Hemoglobin: 12.9 g/dL (ref 12.0–15.0)
Potassium: 4.1 mmol/L (ref 3.5–5.1)
Sodium: 141 mmol/L (ref 135–145)
TCO2: 25 mmol/L (ref 0–100)

## 2016-06-02 MED ORDER — TRANEXAMIC ACID 1000 MG/10ML IV SOLN
500.0000 mg | Freq: Once | INTRAVENOUS | Status: AC
  Administered 2016-06-02: 500 mg via TOPICAL
  Filled 2016-06-02: qty 10

## 2016-06-02 MED ORDER — ALPRAZOLAM 0.25 MG PO TABS
0.2500 mg | ORAL_TABLET | Freq: Once | ORAL | Status: AC
  Administered 2016-06-02: 0.25 mg via ORAL
  Filled 2016-06-02: qty 1

## 2016-06-02 NOTE — ED Provider Notes (Signed)
[facility] DEPT Provider Note   CSN: [id number] Arrival date & time: 06/02/16  0045  By signing my name below, I, [name], attest that this documentation has been prepared under the direction and in the presence of No att. providers found. Electronically Signed: Oleh [name], Scribe. 06/04/16. 7:41 AM.   History   Chief Complaint Chief Complaint  Patient presents with  . Epistaxis    HPI [name] is a 76 y.o. female with history of hypertension, hyperlipidemia, and diabetes who presents to the ED with epistaxis. According to medic report, the patient developed bleeding out the L nare approximately 1 hour ago. She was given 2x Afrin by EMS which has improved her symptoms. She takes 81 mg aspirin daily. She is not otherwise anticoagulated. At interview, she states that she is feeling better, although is experiencing mild bleeding. No syncope.   The history is provided by the patient. No language interpreter was used.    Past Medical History:  Diagnosis Date  . Anxiety   . Arthritis   . Depression   . Essential hypertension   . Fibromyalgia   . Hyperlipemia   . PSVT (paroxysmal supraventricular tachycardia) ([place])   . Reflux   . Restless leg syndrome   . Type 2 diabetes mellitus [facility])     Patient Active Problem List   Diagnosis Date Noted  . Class 2 severe obesity due to excess calories with serious comorbidity and body mass index (BMI) of 36.0 to 36.9 in adult ([place]) 06/01/2016  . Vitamin D deficiency 06/01/2016  . Vitamin B12 deficiency 03/27/2016  . Chronic cough 03/16/2016  . Left foot pain 03/16/2016  . Demand ischemia ([place]) 02/14/2015  . Degenerative drusen 07/13/2013  . Type 2 diabetes mellitus without ophthalmic manifestations ([place]) 07/13/2013  . DM (diabetes mellitus) type II controlled, neurological manifestation ([place]) 05/07/2012  . Paroxysmal atrial tachycardia ([place]) 05/07/2012  . Essential hypertension, benign 05/07/2012  . Depression  05/07/2012  . Hyperlipidemia 05/07/2012  . Fibromyalgia 05/07/2012    Past Surgical History:  Procedure Laterality Date  . ABDOMINAL HYSTERECTOMY    . ANKLE SURGERY    . BACK SURGERY     T12-L1 fusion, Anterior Cervical fusion  . CARDIAC CATHETERIZATION N/A 02/16/2015   Procedure: Left Heart Cath and Coronary Angiography;  Surgeon: Sherren Mocha, MD;  Location: [facility] CV LAB;  Service: Cardiovascular;  Laterality: N/A;  . CHOLECYSTECTOMY    . COLONOSCOPY    . ESOPHAGOGASTRODUODENOSCOPY  12/21/2010   Procedure: ESOPHAGOGASTRODUODENOSCOPY (EGD);  Surgeon: Landry Dyke, MD;  Location: [facility];  Service: Endoscopy;  Laterality: N/A;  . EXCISION MORTON'S NEUROMA    . FRACTURE SURGERY    . KNEE SURGERY     x 3  . NECK SURGERY    . ROTATOR CUFF REPAIR    . TONSILLECTOMY    . UPPER GASTROINTESTINAL ENDOSCOPY    . WRIST SURGERY      OB History    No data available       Home Medications    Prior to Admission medications   Medication Sig Start Date End Date Taking? Authorizing Provider  ALPRAZolam (XANAX) 0.25 MG tablet Take 0.25 mg by mouth 3 (three) times daily as needed for anxiety.  03/03/16  Yes Historical Provider, MD  aspirin EC 81 MG tablet Take 81 mg by mouth daily.   Yes Historical Provider, MD  Calcium Carbonate (CALCIUM 600 PO) Take 1 tablet by mouth daily.   Yes Historical Provider, MD  cholecalciferol (VITAMIN D) 1000 units tablet Take 1,000 Units by mouth daily.   Yes Historical Provider, MD  Cranberry 1000 MG CAPS Take 1,000 mg by mouth 2 (two) times daily.   Yes Historical Provider, MD  Cyanocobalamin (VITAMIN B-12 PO) Take 1 tablet by mouth daily.   Yes Historical Provider, MD  digoxin (LANOXIN) 0.125 MG tablet Take 0.125 mg by mouth daily.  02/12/16  Yes Historical Provider, MD  ezetimibe (ZETIA) 10 MG tablet Take 10 mg by mouth daily.    Yes Historical Provider, MD  gemfibrozil (LOPID) 600 MG tablet Take 600 mg by mouth 2 (two) times daily.   Yes  Historical Provider, MD  insulin glargine (LANTUS) 100 UNIT/ML injection Inject 60 Units into the skin at bedtime.    Yes Historical Provider, MD  metFORMIN (GLUCOPHAGE) 1000 MG tablet Take 1,000 mg by mouth 2 (two) times daily with a meal.  02/12/16  Yes Historical Provider, MD  metoprolol succinate (TOPROL-XL) 25 MG 24 hr tablet Take 25 mg by mouth daily.    Yes Historical Provider, MD  Multiple Vitamins-Minerals (ICAPS AREDS 2 PO) Take 1 capsule by mouth 2 (two) times daily.   Yes Historical Provider, MD  nitroGLYCERIN (NITROSTAT) 0.4 MG SL tablet Place 0.4 mg under the tongue every 5 (five) minutes as needed. As needed chest pain   Yes Historical Provider, MD  omeprazole (PRILOSEC OTC) 20 MG tablet Take 20 mg by mouth daily.   Yes Historical Provider, MD  ramipril (ALTACE) 10 MG capsule Take 10 mg by mouth daily.   Yes Historical Provider, MD  rOPINIRole (REQUIP) 1 MG tablet Take 1 mg by mouth at bedtime.    Yes Historical Provider, MD  verapamil (VERELAN PM) 240 MG 24 hr capsule Take 1 capsule (240 mg total) by mouth daily. 05/17/16  Yes Juliet Rude, MD  RELION INSULIN SYRINGE 1ML/31G 31G X 5/16" 1 ML MISC  02/15/16   Historical Provider, MD  sertraline (ZOLOFT) 50 MG tablet Take 1 tablet (50 mg total) by mouth daily. May increase to 2 tablets (100mg  total) by mouth daily in 1-2 weeks 06/01/16 06/01/17  Leah G [name], MD  spironolactone (ALDACTONE) 25 MG tablet Take 25 mg by mouth daily.    Historical Provider, MD    Family History Family History  Problem Relation Age of Onset  . Heart failure Mother   . Hyperlipidemia Mother   . Lung cancer Father     Lymphoma  . Hyperlipidemia Maternal Grandmother   . Peripheral vascular disease Maternal Grandmother     Social History Social History  Substance Use Topics  . Smoking status: Never Smoker  . Smokeless tobacco: Never Used  . Alcohol use No     Allergies   Amoxil [amoxicillin]; Chocolate; Demerol; and Lisinopril   Review of  Systems Review of Systems  HENT: Positive for nosebleeds.   Neurological: Negative for syncope.  All other systems reviewed and are negative.    Physical Exam Updated Vital Signs BP (!) 158/83   Pulse 73   Temp (!) 96.6 F (35.9 C) (Oral)   Resp 18   Ht 5\' 3"  (1.6 m)   Wt 205 lb (93 kg)   SpO2 98%   BMI 36.31 kg/m   Physical Exam  Constitutional: She is oriented to person, place, and time. She appears well-developed and well-nourished. No distress.  HENT:  Head: Normocephalic and atraumatic.  Right Ear: Hearing normal.  Left Ear: Hearing normal.  Mouth/Throat: Oropharynx is clear and  moist and mucous membranes are normal.  Active bleeding from the L nare.   Eyes: Conjunctivae and EOM are normal. Pupils are equal, round, and reactive to light.  Neck: Normal range of motion. Neck supple.  Cardiovascular: Regular rhythm, S1 normal and S2 normal.  Exam reveals no gallop and no friction rub.   No murmur heard. Pulmonary/Chest: Effort normal and breath sounds normal. No respiratory distress. She exhibits no tenderness.  Abdominal: Soft. Normal appearance and bowel sounds are normal. There is no hepatosplenomegaly. There is no tenderness. There is no rebound, no guarding, no tenderness at McBurney's point and negative Murphy's sign. No hernia.  Musculoskeletal: Normal range of motion.  Neurological: She is alert and oriented to person, place, and time. She has normal strength. No cranial nerve deficit or sensory deficit. Coordination normal. GCS eye subscore is 4. GCS verbal subscore is 5. GCS motor subscore is 6.  Skin: Skin is warm, dry and intact. No rash noted. No cyanosis.  Psychiatric: She has a normal mood and affect. Her speech is normal and behavior is normal. Thought content normal.  Nursing note and vitals reviewed.    ED Treatments / Results  Labs (all labs ordered are listed, but only abnormal results are displayed) Labs Reviewed  I-STAT CHEM 8, ED - Abnormal;  Notable for the following:       Result Value   BUN 25 (*)    Glucose, Bld 187 (*)    All other components within normal limits    EKG  EKG Interpretation None       Radiology No results found.  Procedures Procedures (including critical care time)  Medications Ordered in ED Medications  tranexamic acid (CYKLOKAPRON) injection 500 mg (500 mg Topical Given 06/02/16 0100)  ALPRAZolam (XANAX) tablet 0.25 mg (0.25 mg Oral Given 06/02/16 0226)     Initial Impression / Assessment and Plan / ED Course  I have reviewed the triage vital signs and the nursing notes.  Pertinent labs & imaging results that were available during my care of the patient were reviewed by me and considered in my medical decision making (see chart for details).     Patient presents to the emergency para for evaluation of nosebleed. Patient has been having bleeding from the left side of her nose tonight. She takes low-dose aspirin, no other blood thinners. She denies trauma. Hemoglobin within normal limits. Patient hypertensive at arrival, does have a history. She apparently has been fairly well-controlled on her medications, appears to be very anxious. Was given Xanax which she takes for anxiety and blood pressure improved.  Treated with tranexemic acid but continued to bleed. Patient therefore was packed. Bleeding slowly stopped. Patient did sneeze and the packing was dislodged but there was no further bleeding.  Final Clinical Impressions(s) / ED Diagnoses   Final diagnoses:  Epistaxis    New Prescriptions Discharge Medication List as of 06/02/2016  5:02 AM    I personally performed the services described in this documentation, which was scribed in my presence. The recorded information has been reviewed and is accurate.    Orpah Greek, MD 06/04/16 [phone]

## 2016-06-02 NOTE — ED Notes (Signed)
Pt sneezed rhino rocket out

## 2016-06-02 NOTE — ED Triage Notes (Addendum)
Patient with nosebleed from left nare.  She awoke from sleep with the bleeding.  Patient is hypertensive per EMS.  Patient was given Affrin with EMS (2 sprays) which did slow the bleeding a bit.  Patient is CAOx4.  Patient is on ASA.

## 2016-06-02 NOTE — ED Notes (Signed)
TXA given to MD for use

## 2016-06-02 NOTE — ED Notes (Signed)
Pt ambulated to restroom and back with this RN

## 2016-06-02 NOTE — ED Notes (Signed)
Pt ambulated to restroom and given diet coke

## 2016-06-07 DIAGNOSIS — M48062 Spinal stenosis, lumbar region with neurogenic claudication: Secondary | ICD-10-CM | POA: Diagnosis not present

## 2016-06-22 DIAGNOSIS — M48062 Spinal stenosis, lumbar region with neurogenic claudication: Secondary | ICD-10-CM | POA: Diagnosis not present

## 2016-06-22 DIAGNOSIS — M545 Low back pain: Secondary | ICD-10-CM | POA: Diagnosis not present

## 2016-07-06 DIAGNOSIS — M48062 Spinal stenosis, lumbar region with neurogenic claudication: Secondary | ICD-10-CM | POA: Diagnosis not present

## 2016-07-11 ENCOUNTER — Telehealth: Payer: Self-pay | Admitting: Family Medicine

## 2016-07-11 NOTE — Telephone Encounter (Signed)
Called to see if pt wanted to schedule awv ( 5/31 appt - awv at 3? )  Called twice - first time sounded like we got disconnected ; second time phone didn't ring.

## 2016-07-12 NOTE — Progress Notes (Signed)
HPI:   [name] is a 76 y.o. female, who is here today to follow on recent OV.   She was seen on 06/01/16 when Sertraline 50 mg was re-started for depression, she was taking this medication before but she discontinued abruptly. She is tolerating medication well,deneis side effects. She is not longer taking Alprazolam. In general she feels better, still feeling frustrated dealing with all the things related to her husband recently death. She tells me that her family wants to place her in a assisted leaving. She is dealing with stress better. She denies suicidal thoughts.  She is eating healthier. Limited in regard to regular exercise due to chronic pain. Foot pain, today she has appt with ortho.  Hypertension:   Currently on Aldactone 25 mg daily, Altace 10 mg daily,Metoprolol Succinate 25 mg daily, and Verapamil 240 mg daily.  Aldactone was added a few weeks ago. She has not checked BP at home. No side effects reported.  She has not noted unusual headache, visual changes, exertional chest pain, dyspnea,  focal weakness, or edema.   Lab Results  Component Value Date   CREATININE 0.90 06/02/2016   BUN 25 (H) 06/02/2016   NA 141 06/02/2016   K 4.1 06/02/2016   CL 103 06/02/2016   CO2 22 02/16/2015     GERD: + Heartburn exacerbated by any type of food. She has not identified alleviating factors.  Regurgitation food and non productive cough. Burning sensation on mid chest.  Omeprazole discontinued because somebody from [facility] told her this medication can cause "soft bones."  Denies dysphagia. Denies abdominal pain,vomiting, changes in bowel habits, blood in stool or melena.   Review of Systems  Constitutional: Positive for fatigue. Negative for activity change, appetite change, fever and unexpected weight change.  HENT: Negative for mouth sores, nosebleeds and trouble swallowing.   Eyes: Negative for redness and visual disturbance.  Respiratory:  Positive for cough. Negative for shortness of breath and wheezing.   Cardiovascular: Negative for palpitations and leg swelling.  Gastrointestinal: Positive for nausea. Negative for abdominal pain and vomiting.       Negative for changes in bowel habits.  Genitourinary: Negative for decreased urine volume and hematuria.  Musculoskeletal: Positive for arthralgias and gait problem.  Skin: Negative for rash.  Neurological: Negative for syncope, weakness and headaches.  Psychiatric/Behavioral: Negative for confusion and suicidal ideas. The patient is nervous/anxious.     Current Outpatient Prescriptions on File Prior to Visit  Medication Sig Dispense Refill  . ALPRAZolam (XANAX) 0.25 MG tablet Take 0.25 mg by mouth 3 (three) times daily as needed for anxiety.     Marland Kitchen aspirin EC 81 MG tablet Take 81 mg by mouth daily.    . cholecalciferol (VITAMIN D) 1000 units tablet Take 1,000 Units by mouth daily.    . Cranberry 1000 MG CAPS Take 1,000 mg by mouth 2 (two) times daily.    . Cyanocobalamin (VITAMIN B-12 PO) Take 1 tablet by mouth daily.    . digoxin (LANOXIN) 0.125 MG tablet Take 0.125 mg by mouth daily.     Marland Kitchen ezetimibe (ZETIA) 10 MG tablet Take 10 mg by mouth daily.     Marland Kitchen gemfibrozil (LOPID) 600 MG tablet Take 600 mg by mouth 2 (two) times daily.    . insulin glargine (LANTUS) 100 UNIT/ML injection Inject 60 Units into the skin at bedtime.     . metFORMIN (GLUCOPHAGE) 1000 MG tablet Take 1,000 mg by mouth 2 (two)  times daily with a meal.     . metoprolol succinate (TOPROL-XL) 25 MG 24 hr tablet Take 25 mg by mouth daily.     . nitroGLYCERIN (NITROSTAT) 0.4 MG SL tablet Place 0.4 mg under the tongue every 5 (five) minutes as needed. As needed chest pain    . ramipril (ALTACE) 10 MG capsule Take 10 mg by mouth daily.    Marland Kitchen RELION INSULIN SYRINGE 1ML/31G 31G X 5/16" 1 ML MISC     . rOPINIRole (REQUIP) 1 MG tablet Take 1 mg by mouth at bedtime.     Marland Kitchen spironolactone (ALDACTONE) 25 MG tablet Take 25  mg by mouth daily.    . verapamil (VERELAN PM) 240 MG 24 hr capsule Take 1 capsule (240 mg total) by mouth daily. 30 capsule 2   No current facility-administered medications on file prior to visit.      Past Medical History:  Diagnosis Date  . Anxiety   . Arthritis   . Depression   . Essential hypertension   . Fibromyalgia   . Hyperlipemia   . PSVT (paroxysmal supraventricular tachycardia) ([place])   . Reflux   . Restless leg syndrome   . Type 2 diabetes mellitus (HCC)    Allergies  Allergen Reactions  . Amoxil [Amoxicillin] Swelling    Lips Has patient had a PCN reaction causing immediate rash, facial/tongue/throat swelling, SOB or lightheadedness with hypotension:YES Has patient had a PCN reaction causing severe rash involving mucus membranes or skin necrosis:No Has patient had a PCN reaction that required hospitalization:No Has patient had a PCN reaction occurring within the last 10 years:Yes. If all of the above answers are "NO", then may proceed with Cephalosporin use.   . Chocolate Hives  . Demerol Nausea And Vomiting  . Lisinopril Itching and Swelling    Social History   Social History  . Marital status: Married    Spouse name: Glendell Docker  . Number of children: 2  . Years of education: N/A   Social History Main Topics  . Smoking status: Never Smoker  . Smokeless tobacco: Never Used  . Alcohol use No  . Drug use: No  . Sexual activity: Not Asked   Other Topics Concern  . None   Social History Narrative   Lives with husband.  Independent of ADLs.    Vitals:   07/13/16 1350 07/13/16 1407  BP: 138/90 120/75  Pulse: 83   Resp: 12   O2 sat at RA 97% Body mass index is 36.14 kg/m.   Physical Exam  Nursing note and vitals reviewed. Constitutional: She is oriented to person, place, and time. She appears well-developed. No distress.  HENT:  Head: Atraumatic.  Mouth/Throat: Oropharynx is clear and moist and mucous membranes are normal.  Eyes: Conjunctivae  and EOM are normal. Pupils are equal, round, and reactive to light.  Cardiovascular: Normal rate and regular rhythm.   No murmur heard. Pulses:      Dorsalis pedis pulses are 2+ on the right side, and 2+ on the left side.  Respiratory: Effort normal and breath sounds normal. No respiratory distress.  GI: Soft. She exhibits no mass. There is no hepatomegaly. There is no tenderness.  Musculoskeletal: She exhibits no edema.  Lymphadenopathy:    She has no cervical adenopathy.  Neurological: She is alert and oriented to person, place, and time. She has normal strength. Coordination normal.  Antalgic gait, stable overall,not assisted.  Skin: Skin is warm. No erythema.  Psychiatric: Her mood appears anxious.  Well groomed, good eye contact.     ASSESSMENT AND PLAN:   Wells was seen today for follow-up.  Diagnoses and all orders for this visit:  Gastroesophageal reflux disease, esophagitis presence not specified  We discussed some side effects of PPI medications, she is very symptomatic, so I recommend resuming Omeprazole. GERD precautions discussed. Follow-up in 3-4 months.  -     omeprazole (PRILOSEC) 20 MG capsule; Take 1 capsule (20 mg total) by mouth daily.  Essential hypertension, benign  Adequately controlled. No changes in current management, further recommendations will be given according to lab results. We discussed some side effects of antihypertensive medications. DASH-Low salt diet recommended. Monitor BP at home. F/U in 3-4 months, before if needed.  -     Basic metabolic panel  Mild episode of recurrent major depressive disorder (HCC)  Improved. Continue sertraline 50 mg daily. Some side effects discussed. Instructed about warning signs. Follow-up in 3-4 months, before if needed.  -     sertraline (ZOLOFT) 50 MG tablet; Take 1 tablet (50 mg total) by mouth daily.       Lorretta Kerce G. [name], MD  [facility]. [place]  office.

## 2016-07-13 ENCOUNTER — Ambulatory Visit (INDEPENDENT_AMBULATORY_CARE_PROVIDER_SITE_OTHER): Payer: PPO | Admitting: Family Medicine

## 2016-07-13 ENCOUNTER — Ambulatory Visit: Payer: PPO | Admitting: Podiatry

## 2016-07-13 ENCOUNTER — Encounter: Payer: Self-pay | Admitting: Family Medicine

## 2016-07-13 VITALS — BP 120/75 | HR 83 | Resp 12 | Ht 63.0 in | Wt 204.0 lb

## 2016-07-13 DIAGNOSIS — I10 Essential (primary) hypertension: Secondary | ICD-10-CM

## 2016-07-13 DIAGNOSIS — M722 Plantar fascial fibromatosis: Secondary | ICD-10-CM | POA: Diagnosis not present

## 2016-07-13 DIAGNOSIS — F330 Major depressive disorder, recurrent, mild: Secondary | ICD-10-CM

## 2016-07-13 DIAGNOSIS — K219 Gastro-esophageal reflux disease without esophagitis: Secondary | ICD-10-CM | POA: Insufficient documentation

## 2016-07-13 LAB — BASIC METABOLIC PANEL
BUN: 28 mg/dL — ABNORMAL HIGH (ref 6–23)
CO2: 24 mEq/L (ref 19–32)
Calcium: 9.9 mg/dL (ref 8.4–10.5)
Chloride: 102 mEq/L (ref 96–112)
Creatinine, Ser: 1.13 mg/dL (ref 0.40–1.20)
GFR: 50.04 mL/min — ABNORMAL LOW (ref >60.00)
Glucose, Bld: 186 mg/dL — ABNORMAL HIGH (ref 70–99)
Potassium: 4.4 mEq/L (ref 3.5–5.1)
Sodium: 135 mEq/L (ref 135–145)

## 2016-07-13 MED ORDER — OMEPRAZOLE 20 MG PO CPDR: 20.0000 mg | DELAYED_RELEASE_CAPSULE | Freq: Every day | ORAL | 3 refills | Status: DC

## 2016-07-13 MED ORDER — SERTRALINE HCL 50 MG PO TABS: 50.0000 mg | ORAL_TABLET | Freq: Every day | ORAL | 1 refills | Status: DC

## 2016-07-13 NOTE — Patient Instructions (Signed)
A few things to remember from today's visit:   Essential hypertension, benign  Mild episode of recurrent major depressive disorder (HCC)  Gastroesophageal reflux disease, esophagitis presence not specified - Plan: omeprazole (PRILOSEC) 20 MG capsule   Please be sure medication list is accurate. If a new problem present, please set up appointment sooner than planned today.

## 2016-07-17 DIAGNOSIS — Z634 Disappearance and death of family member: Secondary | ICD-10-CM | POA: Diagnosis not present

## 2016-07-17 DIAGNOSIS — E119 Type 2 diabetes mellitus without complications: Secondary | ICD-10-CM | POA: Diagnosis not present

## 2016-07-17 DIAGNOSIS — M810 Age-related osteoporosis without current pathological fracture: Secondary | ICD-10-CM | POA: Diagnosis not present

## 2016-07-17 DIAGNOSIS — Z794 Long term (current) use of insulin: Secondary | ICD-10-CM | POA: Diagnosis not present

## 2016-07-17 DIAGNOSIS — Z808 Family history of malignant neoplasm of other organs or systems: Secondary | ICD-10-CM | POA: Diagnosis not present

## 2016-07-17 DIAGNOSIS — E781 Pure hyperglyceridemia: Secondary | ICD-10-CM | POA: Diagnosis not present

## 2016-07-17 DIAGNOSIS — E042 Nontoxic multinodular goiter: Secondary | ICD-10-CM | POA: Diagnosis not present

## 2016-07-25 ENCOUNTER — Ambulatory Visit: Payer: PPO | Admitting: Family Medicine

## 2016-07-25 ENCOUNTER — Encounter: Payer: Self-pay | Admitting: Internal Medicine

## 2016-07-25 ENCOUNTER — Encounter: Payer: Self-pay | Admitting: Family Medicine

## 2016-07-25 ENCOUNTER — Ambulatory Visit (INDEPENDENT_AMBULATORY_CARE_PROVIDER_SITE_OTHER): Payer: PPO | Admitting: Internal Medicine

## 2016-07-25 VITALS — BP 130/78 | HR 86 | Temp 98.5°F

## 2016-07-25 DIAGNOSIS — J22 Unspecified acute lower respiratory infection: Secondary | ICD-10-CM

## 2016-07-25 DIAGNOSIS — J019 Acute sinusitis, unspecified: Secondary | ICD-10-CM | POA: Diagnosis not present

## 2016-07-25 DIAGNOSIS — Z7189 Other specified counseling: Secondary | ICD-10-CM | POA: Diagnosis not present

## 2016-07-25 MED ORDER — DOXYCYCLINE HYCLATE 100 MG PO TABS: 100.0000 mg | ORAL_TABLET | Freq: Two times a day (BID) | ORAL | 0 refills | Status: DC

## 2016-07-25 MED ORDER — PROMETHAZINE-DM 6.25-15 MG/5ML PO SYRP: 2.5000 mL | ORAL_SOLUTION | Freq: Four times a day (QID) | ORAL | 0 refills | Status: DC | PRN

## 2016-07-25 NOTE — Patient Instructions (Signed)
This is a respiratory infection   . With sinus and   Bronchitis sx .   Expect achiness and sob .Marland Kitchen   To be better in the next few days.    If face pain or not improved by  The weekend can add anitbiotic.  Cough meds  ,may not work  But can try  mucinex  Dm ,    Or the rx one I gave you .    Fu   If  Fever  .    Or not getting better .  You oxygen level is good.

## 2016-07-25 NOTE — Progress Notes (Signed)
Chief Complaint  Patient presents with  . Shortness of Breath    sx started   . Generalized Body Aches  . Cough  . Nasal Congestion    HPI: [name] 76 y.o. sda pcp na  Got sick fairly fast.  Onset 2 dauys ago had sore throat and   Drainage upper and coughin junk and yellow  .   Bronchial like  Sore all over .    Doesn't feel good and achy .  Feels like  Fever but doesn't have one.  Runny nose   .   At onset  Sinuses hurt  No hx of copd or such .   Had hx of  Flu 2 x per year  . Didn't this year   Husband passed  March 4   renbal    2 weeks ago has plantar fasciitis.   hsa FM and dm.   Very tired and grieving the recnet passing of her husband of many  Years.  No cp no wheezing but feels sob   ROS: See pertinent positives and negatives per HPI. No chills  henoptysisi bad cough at night    Past Medical History:  Diagnosis Date  . Anxiety   . Arthritis   . Depression   . Essential hypertension   . Fibromyalgia   . Hyperlipemia   . PSVT (paroxysmal supraventricular tachycardia) ([place])   . Reflux   . Restless leg syndrome   . Type 2 diabetes mellitus (HCC)     Family History  Problem Relation Age of Onset  . Heart failure Mother   . Hyperlipidemia Mother   . Lung cancer Father        Lymphoma  . Hyperlipidemia Maternal Grandmother   . Peripheral vascular disease Maternal Grandmother     Social History   Social History  . Marital status: Married    Spouse name: Glendell Docker  . Number of children: 2  . Years of education: N/A   Social History Main Topics  . Smoking status: Never Smoker  . Smokeless tobacco: Never Used  . Alcohol use No  . Drug use: No  . Sexual activity: Not Asked   Other Topics Concern  . None   Social History Narrative   Lives with husband.  Independent of ADLs.    Outpatient Medications Prior to Visit  Medication Sig Dispense Refill  . ALPRAZolam (XANAX) 0.25 MG tablet Take 0.25 mg by mouth 3 (three) times daily as needed for  anxiety.     Marland Kitchen aspirin EC 81 MG tablet Take 81 mg by mouth daily.    . Calcium Citrate-Vitamin D (CALCIUM CITRATE +D PO) Take 1 tablet by mouth daily.    . cholecalciferol (VITAMIN D) 1000 units tablet Take 1,000 Units by mouth daily.    . Cranberry 1000 MG CAPS Take 1,000 mg by mouth 2 (two) times daily.    . Cyanocobalamin (VITAMIN B-12 PO) Take 1 tablet by mouth daily.    . digoxin (LANOXIN) 0.125 MG tablet Take 0.125 mg by mouth daily.     Marland Kitchen ezetimibe (ZETIA) 10 MG tablet Take 10 mg by mouth daily.     Marland Kitchen gemfibrozil (LOPID) 600 MG tablet Take 600 mg by mouth 2 (two) times daily.    . insulin glargine (LANTUS) 100 UNIT/ML injection Inject 60 Units into the skin at bedtime.     Marland Kitchen loratadine (CLARITIN) 10 MG tablet Take 10 mg by mouth daily.    . metFORMIN (GLUCOPHAGE) 1000  MG tablet Take 1,000 mg by mouth 2 (two) times daily with a meal.     . methocarbamol (ROBAXIN) 500 MG tablet Take 500 mg by mouth.    . metoprolol succinate (TOPROL-XL) 25 MG 24 hr tablet Take 25 mg by mouth daily.     . nitroGLYCERIN (NITROSTAT) 0.4 MG SL tablet Place 0.4 mg under the tongue every 5 (five) minutes as needed. As needed chest pain    . omeprazole (PRILOSEC) 20 MG capsule Take 1 capsule (20 mg total) by mouth daily. 90 capsule 3  . ramipril (ALTACE) 10 MG capsule Take 10 mg by mouth daily.    Marland Kitchen RELION INSULIN SYRINGE 1ML/31G 31G X 5/16" 1 ML MISC     . rOPINIRole (REQUIP) 1 MG tablet Take 1 mg by mouth at bedtime.     . sertraline (ZOLOFT) 50 MG tablet Take 1 tablet (50 mg total) by mouth daily. 60 tablet 1  . spironolactone (ALDACTONE) 25 MG tablet Take 25 mg by mouth daily.    . verapamil (VERELAN PM) 240 MG 24 hr capsule Take 1 capsule (240 mg total) by mouth daily. 30 capsule 2   No facility-administered medications prior to visit.      EXAM:  BP 130/78 (BP Location: Left Arm, Patient Position: Sitting, Cuff Size: Large)   Pulse 86   Temp 98.5 F (36.9 C) (Oral)   SpO2 98%   There is no  height or weight on file to calculate BMI. WDWN in NAD  quiet respirations; mildly congested  somewhat hoarse. Non toxic . Looks like under the weather   ? Some dyspnea at rest ?  Go color no retraction s HEENT: Normocephalic ;atraumatic , Eyes;  PERRL, EOMs  Full, lids and conjunctiva clear,,Ears: no deformities, canals nl, TM landmarks normal, Nose: no deformity or discharge but congested;face max  tender Mouth : OP clear without lesion or edema . Neck: Supple without adenopathy or masses or bruits Chest:  Clear to A&P without wheezes rales or rhonchi deep bronchial cough  CV:  S1-S2 no gallops or murmurs peripheral perfusion is normal Skin :nl perfusion and no acute rashes  Walks with cane can get on table independently  ASSESSMENT AND PLAN:  Discussed the following assessment and plan:  Acute respiratory infection - flu like viral but sinusitis   comfort care declined chest x ray  add antibiotic if continues wo improvment  rov if worse  Acute rhinosinusitis  Bereavement counseling - lossof husband  march  suggest hospice supportive grief counseling Total visit 29mins > 50% spent counseling and coordinating care as indicated in above note and in instructions to patient .   -Patient advised to return or notify health care team  if symptoms worsen ,persist or new concerns arise.  Patient Instructions  This is a respiratory infection   . With sinus and   Bronchitis sx .   Expect achiness and sob .Marland Kitchen   To be better in the next few days.    If face pain or not improved by  The weekend can add anitbiotic.  Cough meds  ,may not work  But can try  mucinex  Dm ,    Or the rx one I gave you .    Fu   If  Fever  .    Or not getting better .  You oxygen level is good.        Standley Brooking. Lacy Sofia M.D.

## 2016-07-31 ENCOUNTER — Ambulatory Visit (INDEPENDENT_AMBULATORY_CARE_PROVIDER_SITE_OTHER): Payer: PPO | Admitting: Family Medicine

## 2016-07-31 ENCOUNTER — Encounter: Payer: Self-pay | Admitting: Family Medicine

## 2016-07-31 VITALS — BP 120/74 | HR 70 | Temp 98.5°F | Wt 208.6 lb

## 2016-07-31 DIAGNOSIS — R05 Cough: Secondary | ICD-10-CM | POA: Diagnosis not present

## 2016-07-31 DIAGNOSIS — R062 Wheezing: Secondary | ICD-10-CM

## 2016-07-31 DIAGNOSIS — J069 Acute upper respiratory infection, unspecified: Secondary | ICD-10-CM | POA: Diagnosis not present

## 2016-07-31 DIAGNOSIS — R059 Cough, unspecified: Secondary | ICD-10-CM

## 2016-07-31 MED ORDER — PROMETHAZINE-DM 6.25-15 MG/5ML PO SYRP: 2.5000 mL | ORAL_SOLUTION | Freq: Four times a day (QID) | ORAL | 0 refills | Status: DC | PRN

## 2016-07-31 MED ORDER — ALBUTEROL SULFATE (2.5 MG/3ML) 0.083% IN NEBU
2.5000 mg | INHALATION_SOLUTION | Freq: Once | RESPIRATORY_TRACT | Status: AC
  Administered 2016-07-31: 2.5 mg via RESPIRATORY_TRACT

## 2016-07-31 MED ORDER — DOXYCYCLINE HYCLATE 100 MG PO TABS: 100.0000 mg | ORAL_TABLET | Freq: Two times a day (BID) | ORAL | 0 refills | Status: DC

## 2016-07-31 NOTE — Progress Notes (Signed)
Patient ID: [name], female   DOB: 16-Jul-1940, 76 y.o.   MRN: [id number]  PCP: [name], [name], MD  Subjective:  [name] is a 76 y.o. year old very pleasant female patient who presents with Upper Respiratory infection symptoms including nasal congestion, sore throat, cough that is productive yellow drainage, intermittent SOB is noted with exertion and is not present at rest -started: 8 to 10 days ago, symptoms are not improving; she was evaluated and treated on 07/25/16 for symptoms that she reports have minimally improved; she also reports having stress due to recent death of her husband. -previous treatments: Doxycycline for 5 days has provided limited/moderate benefit. Promethazine/dextromethorphan has provided limited benefit. Mucinex DM has provided limited benefit. -sick contacts/travel/risks: denies flu exposure; denies recent sick contact exposure -Hx of: allergies  ROS-denies fever, NVD, tooth pain  Pertinent Past Medical History- HTN, T2DM that is controlled, depression, hyperlipidemia  Medications- reviewed  Current Outpatient Prescriptions  Medication Sig Dispense Refill  . ALPRAZolam (XANAX) 0.25 MG tablet Take 0.25 mg by mouth 3 (three) times daily as needed for anxiety.     Marland Kitchen aspirin EC 81 MG tablet Take 81 mg by mouth daily.    . Calcium Citrate-Vitamin D (CALCIUM CITRATE +D PO) Take 1 tablet by mouth daily.    . cholecalciferol (VITAMIN D) 1000 units tablet Take 1,000 Units by mouth daily.    . Cranberry 1000 MG CAPS Take 1,000 mg by mouth 2 (two) times daily.    . Cyanocobalamin (VITAMIN B-12 PO) Take 1 tablet by mouth daily.    . digoxin (LANOXIN) 0.125 MG tablet Take 0.125 mg by mouth daily.     Marland Kitchen doxycycline (VIBRA-TABS) 100 MG tablet Take 1 tablet (100 mg total) by mouth 2 (two) times daily. 14 tablet 0  . ezetimibe (ZETIA) 10 MG tablet Take 10 mg by mouth daily.     Marland Kitchen gemfibrozil (LOPID) 600 MG tablet Take 600 mg by mouth 2 (two) times daily.    .  insulin glargine (LANTUS) 100 UNIT/ML injection Inject 60 Units into the skin at bedtime.     Marland Kitchen loratadine (CLARITIN) 10 MG tablet Take 10 mg by mouth daily.    . metFORMIN (GLUCOPHAGE) 1000 MG tablet Take 1,000 mg by mouth 2 (two) times daily with a meal.     . methocarbamol (ROBAXIN) 500 MG tablet Take 500 mg by mouth.    . metoprolol succinate (TOPROL-XL) 25 MG 24 hr tablet Take 25 mg by mouth daily.     . nitroGLYCERIN (NITROSTAT) 0.4 MG SL tablet Place 0.4 mg under the tongue every 5 (five) minutes as needed. As needed chest pain    . omeprazole (PRILOSEC) 20 MG capsule Take 1 capsule (20 mg total) by mouth daily. 90 capsule 3  . promethazine-dextromethorphan (PROMETHAZINE-DM) 6.25-15 MG/5ML syrup Take 2.5 mLs by mouth 4 (four) times daily as needed for cough. 118 mL 0  . ramipril (ALTACE) 10 MG capsule Take 10 mg by mouth daily.    Marland Kitchen RELION INSULIN SYRINGE 1ML/31G 31G X 5/16" 1 ML MISC     . rOPINIRole (REQUIP) 1 MG tablet Take 1 mg by mouth at bedtime.     . sertraline (ZOLOFT) 50 MG tablet Take 1 tablet (50 mg total) by mouth daily. 60 tablet 1  . spironolactone (ALDACTONE) 25 MG tablet Take 25 mg by mouth daily.    . verapamil (VERELAN PM) 240 MG 24 hr capsule Take 1 capsule (240 mg total) by mouth  daily. 30 capsule 2   No current facility-administered medications for this visit.     Objective: BP 120/74 (BP Location: Left Arm, Patient Position: Sitting, Cuff Size: Large)   Pulse 70   Temp 98.5 F (36.9 C) (Oral)   Wt 208 lb 9.6 oz (94.6 kg)   SpO2 97%   BMI 36.95 kg/m  Gen: NAD, resting comfortably HEENT: Turbinates erythematous, TM normal, pharynx mildly erythematous with no tonsilar exudate or edema, no sinus tenderness CV: RRR no murmurs rubs or gallops Lungs: CTAB no crackles, mild expiratory wheeze noted in right and left upper fields, No rhonchi present Abdomen: soft/nontender/nondistended/normal bowel sounds. No rebound or guarding.  Ext: no edema Skin: warm, dry, no  rash Neuro: grossly normal, moves all extremities  Assessment/Plan: 1. Upper respiratory tract infection, unspecified type Symptoms improving slowly; chronic comorbid conditions; will extend doxycycline order by 3 days for a total of 10 days of treatment that was initiated by Dr. Regis Bill on 07/25/16. Also, provided refill for cough medication and discussed that cough can last 2 to 4 weeks.   2. Cough Advised supportive measures of increasing fluids and using cough syrup as needed.   3. Wheezing Minimal wheezing noted; pulse oximeter 97%; no fever; RR 15; will provide albuterol nebulizer for patient and advise follow up if symptoms do not improve with treatment, worsen, or she develops SOB or fever .  We discussed that we did not find any infection that had higher probability of being bacterial such as pneumonia or strep throat. Further discussed that symptom of cough can linger for 2 to 4 weeks but should be improving. Also discussed that this may likely be viral in nature. Finally, we reviewed reasons to return to care including if symptoms worsen or persist or new concerns arise- once again particularly shortness of breath or fever.  She reported improvement with nebulizer and oxygenation is good with pulse oximeter at 97% prior to treatment. Can send for chest  X-ray if symptoms are not improving with extension of antibiotic and supportive care.    Laurita Quint, FNP

## 2016-07-31 NOTE — Patient Instructions (Signed)
Please take medication as directed and follow up with Dr. [name] if symptoms do not improve with treatment, worsen, or you develop a fever or shortness of breath.   Upper Respiratory Infection, Adult Most upper respiratory infections (URIs) are caused by a virus. A URI affects the nose, throat, and upper air passages. The most common type of URI is often called "the common cold." Follow these instructions at home:  Take medicines only as told by your doctor.  Gargle warm saltwater or take cough drops to comfort your throat as told by your doctor.  Use a warm mist humidifier or inhale steam from a shower to increase air moisture. This may make it easier to breathe.  Drink enough fluid to keep your pee (urine) clear or pale yellow.  Eat soups and other clear broths.  Have a healthy diet.  Rest as needed.  Go back to work when your fever is gone or your doctor says it is okay. ? You may need to stay home longer to avoid giving your URI to others. ? You can also wear a face mask and wash your hands often to prevent spread of the virus.  Use your inhaler more if you have asthma.  Do not use any tobacco products, including cigarettes, chewing tobacco, or electronic cigarettes. If you need help quitting, ask your doctor. Contact a doctor if:  You are getting worse, not better.  Your symptoms are not helped by medicine.  You have chills.  You are getting more short of breath.  You have brown or red mucus.  You have yellow or brown discharge from your nose.  You have pain in your face, especially when you bend forward.  You have a fever.  You have puffy (swollen) neck glands.  You have pain while swallowing.  You have white areas in the back of your throat. Get help right away if:  You have very bad or constant: ? Headache. ? Ear pain. ? Pain in your forehead, behind your eyes, and over your cheekbones (sinus pain). ? Chest pain.  You have long-lasting (chronic) lung  disease and any of the following: ? Wheezing. ? Long-lasting cough. ? Coughing up blood. ? A change in your usual mucus.  You have a stiff neck.  You have changes in your: ? Vision. ? Hearing. ? Thinking. ? Mood. This information is not intended to replace advice given to you by your health care provider. Make sure you discuss any questions you have with your health care provider. Document Released: 07/19/2007 Document Revised: 10/03/2015 Document Reviewed: 05/07/2013 Elsevier Interactive Patient Education  2018 [employer].

## 2016-08-01 ENCOUNTER — Other Ambulatory Visit: Payer: Self-pay | Admitting: Family Medicine

## 2016-08-01 NOTE — Telephone Encounter (Signed)
Please verify with pt that she is taking Ramipril because Lisinopril is on her allergy list. Also if she is taking Lopid and Zetia.  Thanks, BJ

## 2016-08-02 ENCOUNTER — Other Ambulatory Visit: Payer: Self-pay | Admitting: Pulmonary Disease

## 2016-08-02 DIAGNOSIS — F330 Major depressive disorder, recurrent, mild: Secondary | ICD-10-CM

## 2016-08-02 MED ORDER — GEMFIBROZIL 600 MG PO TABS: 600.0000 mg | ORAL_TABLET | Freq: Two times a day (BID) | ORAL | 2 refills | Status: DC

## 2016-08-02 MED ORDER — RAMIPRIL 10 MG PO CAPS: 10.0000 mg | ORAL_CAPSULE | Freq: Every day | ORAL | 1 refills | Status: DC

## 2016-08-02 NOTE — Telephone Encounter (Signed)
Patient said that she is taking the Ramipril, and she is also taking the Lopid and Zetia.

## 2016-09-04 ENCOUNTER — Other Ambulatory Visit: Payer: Self-pay

## 2016-09-04 DIAGNOSIS — F330 Major depressive disorder, recurrent, mild: Secondary | ICD-10-CM

## 2016-09-04 MED ORDER — SERTRALINE HCL 50 MG PO TABS: 50.0000 mg | ORAL_TABLET | Freq: Every day | ORAL | 3 refills | Status: DC

## 2016-09-12 DIAGNOSIS — I2510 Atherosclerotic heart disease of native coronary artery without angina pectoris: Secondary | ICD-10-CM | POA: Diagnosis not present

## 2016-09-12 DIAGNOSIS — E1140 Type 2 diabetes mellitus with diabetic neuropathy, unspecified: Secondary | ICD-10-CM | POA: Diagnosis not present

## 2016-09-12 DIAGNOSIS — I471 Supraventricular tachycardia: Secondary | ICD-10-CM | POA: Diagnosis not present

## 2016-09-12 DIAGNOSIS — E668 Other obesity: Secondary | ICD-10-CM | POA: Diagnosis not present

## 2016-09-12 DIAGNOSIS — I10 Essential (primary) hypertension: Secondary | ICD-10-CM | POA: Diagnosis not present

## 2016-09-12 DIAGNOSIS — E785 Hyperlipidemia, unspecified: Secondary | ICD-10-CM | POA: Diagnosis not present

## 2016-10-04 ENCOUNTER — Ambulatory Visit: Payer: PPO | Admitting: Adult Health

## 2016-11-02 ENCOUNTER — Encounter: Payer: Self-pay | Admitting: Family Medicine

## 2016-11-09 DIAGNOSIS — G2581 Restless legs syndrome: Secondary | ICD-10-CM | POA: Insufficient documentation

## 2016-11-09 NOTE — Progress Notes (Signed)
HPI:   [name] is a 76 y.o. female, who is here today with her daughter for 4 months follow up.   Her daughter has a few questions about some of her medications.  She was last seen on 07/13/16.  Since her last OV she has seen twice in 07/2016 for UR symptoms. DM II, she follows with Dr Buddy Duty at [facility] [place]   RLS: She is on Requip 1 mg at bedtime. Medication has helped with symptoms, leg discomfort. She denies erythema or edema  Lab Results  Component Value Date   WBC 6.0 03/16/2016   HGB 12.9 06/02/2016   HCT 38.0 06/02/2016   MCV 89 03/16/2016   PLT 235 03/16/2016     Depression: She is on Sertraline 50 mg daily, which seems to be helping more that Cymbalta did but still feeling depressed. Denies suicidal thoughts.  Sleeping about 8-10 hours, nocturia X 1-2 but able to fall asleep in a few minutes. She denies suicidal ideation. Grandson living with her, which seems to be causing some stress. According to daughter, she chose to allow him live with her, he has shown not responsibility and has not been able to keep a job. Complains of not able to have every thing she wants to do done.  + Crying spells.   Hypertension:   Currently on Aldactone 25 mg,Altace 10 mg,Metoprolol Succinate 25 mg,and Verapamil 240 mg daily.   She is taking medications as instructed, no side effects reported.  She has not noted unusual headache, visual changes, exertional chest pain, dyspnea,  focal weakness, or edema.   Lab Results  Component Value Date   CREATININE 1.13 07/13/2016   BUN 28 (H) 07/13/2016   NA 135 07/13/2016   K 4.4 07/13/2016   CL 102 07/13/2016   CO2 24 07/13/2016   HLD:  She has not tolerated statin medications. 01/2016 Lopid recommended, she noted worsening myalgias,so discontinued. She has not been consistent with low fat diet. She is on Zetia 10 mg daily.   Lab Results  Component Value Date   CHOL 151 01/20/2016   HDL 27 (A)  01/20/2016   LDLCALC 82 01/20/2016   TRIG 438 (A) 01/20/2016   CHOLHDL 5.9 02/15/2015    GERD:  In 06/2016 Omeprazole 20 mg was started. Still having heartburn and acid reflux at night. According to daughter,she does not follow dietary recommendations. She eat cookies and cakes daily.   Denies abdominal pain, nausea, vomiting, changes in bowel habits, blood in stool or melena.  Obesity:  Dietary changes since last OV: None Exercise: Not regularly die to back and hip pain. Hx of chronic foot pain, so it is hard to exercise regularly.  Chronic pain: Hx of fibromyalgia and lower back pain with radiation to LE's, R>L + burning sensation of LE's. According to pt, neuro referred her to ortho because right hip and leg pain do not seem to be related to her back. It was recommended to try PT but she has not heard from appt arrangements.   Vit D deficiency: She is on Vit D3 1000 U daily. She completed Ergocalciferol 50,000 U treatment. States that this has not been checked since she complated treatment.   B12 deficiency: She is on OTC B12 1000 mcg daily. She was receiving B12 injections in the past. Reporting Hx of pernicious anemia.   Review of Systems  Constitutional: Positive for fatigue. Negative for appetite change and fever.  HENT: Negative for mouth  sores, nosebleeds, sore throat and trouble swallowing.   Eyes: Negative for redness and visual disturbance.  Respiratory: Negative for cough, shortness of breath and wheezing.   Cardiovascular: Negative for chest pain, palpitations and leg swelling.  Gastrointestinal: Negative for abdominal pain, nausea and vomiting.       Negative for changes in bowel habits.  Endocrine: Negative for cold intolerance, heat intolerance, polydipsia, polyphagia and polyuria.  Genitourinary: Negative for decreased urine volume, dysuria and hematuria.  Musculoskeletal: Positive for arthralgias, back pain and myalgias.  Skin: Negative for rash and  wound.  Allergic/Immunologic: Positive for environmental allergies.  Neurological: Negative for syncope, weakness and headaches.  Hematological: Negative for adenopathy. Does not bruise/bleed easily.  Psychiatric/Behavioral: Negative for confusion, sleep disturbance and suicidal ideas. The patient is nervous/anxious.     Current Outpatient Prescriptions on File Prior to Visit  Medication Sig Dispense Refill  . aspirin EC 81 MG tablet Take 81 mg by mouth daily.    . Calcium Citrate-Vitamin D (CALCIUM CITRATE +D PO) Take 1 tablet by mouth daily.    . cholecalciferol (VITAMIN D) 1000 units tablet Take 1,000 Units by mouth daily.    . Cranberry 1000 MG CAPS Take 4,200 mg by mouth as needed. + Vit C 40 mg    . Cyanocobalamin (VITAMIN B-12 PO) Take 1 tablet by mouth daily.    . digoxin (LANOXIN) 0.125 MG tablet Take 0.125 mg by mouth daily.     . insulin glargine (LANTUS) 100 UNIT/ML injection Inject 60 Units into the skin at bedtime.     . metFORMIN (GLUCOPHAGE) 1000 MG tablet Take 1,000 mg by mouth 2 (two) times daily with a meal.     . nitroGLYCERIN (NITROSTAT) 0.4 MG SL tablet Place 0.4 mg under the tongue every 5 (five) minutes as needed. As needed chest pain    . ramipril (ALTACE) 10 MG capsule Take 1 capsule (10 mg total) by mouth daily. 90 capsule 1  . RELION INSULIN SYRINGE 1ML/31G 31G X 5/16" 1 ML MISC     . verapamil (VERELAN PM) 240 MG 24 hr capsule Take 1 capsule (240 mg total) by mouth daily. 30 capsule 2   No current facility-administered medications on file prior to visit.      Past Medical History:  Diagnosis Date  . Anxiety   . Arthritis   . Depression   . Essential hypertension   . Fibromyalgia   . Hyperlipemia   . PSVT (paroxysmal supraventricular tachycardia) ([place])   . Reflux   . Restless leg syndrome   . Type 2 diabetes mellitus (HCC)    Allergies  Allergen Reactions  . Amoxil [Amoxicillin] Swelling    Lips Has patient had a PCN reaction causing immediate  rash, facial/tongue/throat swelling, SOB or lightheadedness with hypotension:YES Has patient had a PCN reaction causing severe rash involving mucus membranes or skin necrosis:No Has patient had a PCN reaction that required hospitalization:No Has patient had a PCN reaction occurring within the last 10 years:Yes. If all of the above answers are "NO", then may proceed with Cephalosporin use.   . Chocolate Hives  . Demerol Nausea And Vomiting  . Lisinopril Itching and Swelling    Social History   Social History  . Marital status: Married    Spouse name: Glendell Docker  . Number of children: 2  . Years of education: N/A   Social History Main Topics  . Smoking status: Never Smoker  . Smokeless tobacco: Never Used  . Alcohol use No  .  Drug use: No  . Sexual activity: Not Asked   Other Topics Concern  . None   Social History Narrative   Lives with husband.  Independent of ADLs.    Vitals:   11/10/16 1443  BP: 136/80  Pulse: 80  Resp: 16  SpO2: 95%   Body mass index is 36.38 kg/m.   Wt Readings from Last 3 Encounters:  11/10/16 205 lb 6 oz (93.2 kg)  07/31/16 208 lb 9.6 oz (94.6 kg)  07/13/16 204 lb (92.5 kg)    Physical Exam  Nursing note and vitals reviewed. Constitutional: She is oriented to person, place, and time. She appears well-developed. No distress.  HENT:  Head: Normocephalic and atraumatic.  Mouth/Throat: Oropharynx is clear and moist and mucous membranes are normal.  Eyes: Pupils are equal, round, and reactive to light. Conjunctivae are normal.  Cardiovascular: Normal rate and regular rhythm.   No murmur heard. Pulses:      Dorsalis pedis pulses are 2+ on the right side, and 2+ on the left side.  Respiratory: Effort normal and breath sounds normal. No respiratory distress.  GI: Soft. She exhibits no mass. There is no hepatomegaly. There is no tenderness.  Musculoskeletal: She exhibits no edema.       Cervical back: She exhibits decreased range of motion. She  exhibits no bony tenderness.       Thoracic back: She exhibits tenderness. She exhibits no bony tenderness.       Lumbar back: She exhibits tenderness and spasm. She exhibits no bony tenderness.  Tender points upon palpation of cervical,thoracic,and lumbar paraspinal muscles. Knee crepitus bilateral.  Pain of hips with ROM, elicits pain, mainly with rotation and R>L. ROM with no significant limitations.   Lymphadenopathy:    She has no cervical adenopathy.  Neurological: She is alert and oriented to person, place, and time. She has normal strength. Coordination normal.  Stable gait with no assistance. SLR negative bilateral.  Skin: Skin is warm. No rash noted. No erythema.  Psychiatric: Her mood appears anxious. Her affect is labile. She expresses no suicidal ideation.  Fairly groomed, good eye contact.    ASSESSMENT AND PLAN:   [name] was seen today for follow-up.  Diagnoses and all orders for this visit:  Lab Results  Component Value Date   [id number] 098 11/10/2016   Lab Results  Component Value Date   CREATININE 0.97 (H) 11/10/2016   BUN 24 11/10/2016   NA 135 11/10/2016   K 4.6 11/10/2016   CL 101 11/10/2016   CO2 21 11/10/2016    Gastroesophageal reflux disease, esophagitis presence not specified  Still symptomatic but she is not following dietary recommendations either. Omeprazole increased to 20 mg bid. GERD precautions discussed and recommended. F/U in 2 months.  -     omeprazole (PRILOSEC) 20 MG capsule; Take 1 capsule (20 mg total) by mouth 2 (two) times daily before a meal.  RLS (restless legs syndrome)  Stable. No changes in current management. F/U in 12 months.  -     rOPINIRole (REQUIP) 1 MG tablet; Take 1 tablet (1 mg total) by mouth at bedtime.  Essential hypertension, benign  Adequately controlled. No changes in current management. DASH diet recommended. Eye exam recommended annually. F/U in 6 months, before if needed.  -      metoprolol succinate (TOPROL-XL) 25 MG 24 hr tablet; Take 1 tablet (25 mg total) by mouth daily. -     Basic metabolic panel  Class 2 severe  obesity due to excess calories with serious comorbidity and body mass index (BMI) of 36.0 to 36.9 in adult [facility])  We discussed benefits of wt loss as well as adverse effects of obesity. Consistency with healthy diet and physical activity recommended. Low impact regular exercise recommended.  Mild episode of recurrent major depressive disorder (HCC)  Improved some but still symptomatic. Zoloft increased from 50 mg to 100 mg. Some side effects discussed. Instructed about warning signs. F/U in 6-8 weeks.  -     sertraline (ZOLOFT) 100 MG tablet; Take 1 tablet (100 mg total) by mouth daily.  Vitamin B12 deficiency  No changes in current management, will follow labs done today and will give further recommendations accordingly.  -     Vitamin B12  Vitamin D deficiency  No changes in current management, will make further recommendations according to lab results.  -     VITAMIN D 25 Hydroxy (Vit-D Deficiency, Fractures)  Hyperlipidemia, unspecified hyperlipidemia type  She has not tolerated statins, Tricor, and now Lopid. Continue Zetia 10 mg. Livalo can be considered,she is not interested for now. Low fat diet discussed and recommended.  -     ezetimibe (ZETIA) 10 MG tablet; Take 1 tablet (10 mg total) by mouth daily.  Need for prophylactic vaccination and inoculation against influenza -     Flu vaccine HIGH DOSE PF   40 min face to face OV. > 50% was dedicated to discussion of all above Dx as well as DM II,fibromyalgia, and diabetic neuropathy. Prognosis, treatment options, side effects of medications, and coordination of care. We reviewed medication list  and indications. Fall precaution discussed also. Her daughter is planning on scheduling appt with ortho. Pain management referral is an options depending of discussion with ortho and  changes after PT.    -[name] was advised to return sooner than planned today if new concerns arise.       Leah Mittag G. [name], MD  [facility]. [place] office.

## 2016-11-10 ENCOUNTER — Ambulatory Visit (INDEPENDENT_AMBULATORY_CARE_PROVIDER_SITE_OTHER): Payer: PPO | Admitting: Family Medicine

## 2016-11-10 ENCOUNTER — Encounter: Payer: Self-pay | Admitting: Family Medicine

## 2016-11-10 VITALS — BP 136/80 | HR 80 | Resp 16 | Ht 63.0 in | Wt 205.4 lb

## 2016-11-10 DIAGNOSIS — E538 Deficiency of other specified B group vitamins: Secondary | ICD-10-CM

## 2016-11-10 DIAGNOSIS — G2581 Restless legs syndrome: Secondary | ICD-10-CM

## 2016-11-10 DIAGNOSIS — E559 Vitamin D deficiency, unspecified: Secondary | ICD-10-CM | POA: Diagnosis not present

## 2016-11-10 DIAGNOSIS — I10 Essential (primary) hypertension: Secondary | ICD-10-CM

## 2016-11-10 DIAGNOSIS — E785 Hyperlipidemia, unspecified: Secondary | ICD-10-CM

## 2016-11-10 DIAGNOSIS — F330 Major depressive disorder, recurrent, mild: Secondary | ICD-10-CM | POA: Diagnosis not present

## 2016-11-10 DIAGNOSIS — K219 Gastro-esophageal reflux disease without esophagitis: Secondary | ICD-10-CM

## 2016-11-10 DIAGNOSIS — E66812 Obesity, class 2: Secondary | ICD-10-CM

## 2016-11-10 DIAGNOSIS — Z23 Encounter for immunization: Secondary | ICD-10-CM

## 2016-11-10 DIAGNOSIS — Z6836 Body mass index (BMI) 36.0-36.9, adult: Secondary | ICD-10-CM | POA: Diagnosis not present

## 2016-11-10 MED ORDER — ROPINIROLE HCL 1 MG PO TABS: 1.0000 mg | ORAL_TABLET | Freq: Every day | ORAL | 1 refills | Status: DC

## 2016-11-10 MED ORDER — METOPROLOL SUCCINATE ER 25 MG PO TB24: 25.0000 mg | ORAL_TABLET | Freq: Every day | ORAL | 1 refills | Status: DC

## 2016-11-10 MED ORDER — EZETIMIBE 10 MG PO TABS: 10.0000 mg | ORAL_TABLET | Freq: Every day | ORAL | 2 refills | Status: DC

## 2016-11-10 MED ORDER — SERTRALINE HCL 100 MG PO TABS: 100.0000 mg | ORAL_TABLET | Freq: Every day | ORAL | 3 refills | Status: DC

## 2016-11-10 MED ORDER — OMEPRAZOLE 20 MG PO CPDR: 20.0000 mg | DELAYED_RELEASE_CAPSULE | Freq: Two times a day (BID) | ORAL | 3 refills | Status: DC

## 2016-11-10 NOTE — Patient Instructions (Addendum)
A few things to remember from today's visit:   Essential hypertension, benign - Plan: Basic metabolic panel  RLS (restless legs syndrome)  Class 2 severe obesity due to excess calories with serious comorbidity and body mass index (BMI) of 36.0 to 36.9 in adult [facility])  Mild episode of recurrent major depressive disorder ([place]) - Plan: sertraline (ZOLOFT) 100 MG tablet  Gastroesophageal reflux disease, esophagitis presence not specified - Plan: omeprazole (PRILOSEC) 20 MG capsule  Vitamin B12 deficiency - Plan: Vitamin B12  Vitamin D deficiency - Plan: VITAMIN D 25 Hydroxy (Vit-D Deficiency, Fractures)  Today Zoloft increased to 100 mg. Ortho appt.  Omeprazole increased to 20 mg 2 times per day.   Please be sure medication list is accurate. If a new problem present, please set up appointment sooner than planned today.

## 2016-11-11 LAB — BASIC METABOLIC PANEL
BUN/Creatinine Ratio: 25 (calc) — ABNORMAL HIGH (ref 6–22)
BUN: 24 mg/dL (ref 7–25)
CO2: 21 mmol/L (ref 20–32)
Calcium: 9.7 mg/dL (ref 8.6–10.4)
Chloride: 101 mmol/L (ref 98–110)
Creat: 0.97 mg/dL — ABNORMAL HIGH (ref 0.60–0.93)
Glucose, Bld: 248 mg/dL — ABNORMAL HIGH (ref 65–99)
Potassium: 4.6 mmol/L (ref 3.5–5.3)
Sodium: 135 mmol/L (ref 135–146)

## 2016-11-11 LAB — VITAMIN B12: Vitamin B-12: 914 pg/mL (ref 200–1100)

## 2016-11-11 LAB — VITAMIN D 25 HYDROXY (VIT D DEFICIENCY, FRACTURES): Vit D, 25-Hydroxy: 22 ng/mL — ABNORMAL LOW (ref 30–100)

## 2016-11-12 ENCOUNTER — Encounter: Payer: Self-pay | Admitting: Family Medicine

## 2016-11-15 DIAGNOSIS — M9904 Segmental and somatic dysfunction of sacral region: Secondary | ICD-10-CM | POA: Diagnosis not present

## 2016-11-15 DIAGNOSIS — M9903 Segmental and somatic dysfunction of lumbar region: Secondary | ICD-10-CM | POA: Diagnosis not present

## 2016-11-15 DIAGNOSIS — M9905 Segmental and somatic dysfunction of pelvic region: Secondary | ICD-10-CM | POA: Diagnosis not present

## 2016-11-15 DIAGNOSIS — M5417 Radiculopathy, lumbosacral region: Secondary | ICD-10-CM | POA: Diagnosis not present

## 2016-11-16 DIAGNOSIS — M9905 Segmental and somatic dysfunction of pelvic region: Secondary | ICD-10-CM | POA: Diagnosis not present

## 2016-11-16 DIAGNOSIS — M5417 Radiculopathy, lumbosacral region: Secondary | ICD-10-CM | POA: Diagnosis not present

## 2016-11-16 DIAGNOSIS — M9903 Segmental and somatic dysfunction of lumbar region: Secondary | ICD-10-CM | POA: Diagnosis not present

## 2016-11-16 DIAGNOSIS — M9904 Segmental and somatic dysfunction of sacral region: Secondary | ICD-10-CM | POA: Diagnosis not present

## 2016-11-18 ENCOUNTER — Encounter: Payer: Self-pay | Admitting: Family Medicine

## 2016-11-20 DIAGNOSIS — M9905 Segmental and somatic dysfunction of pelvic region: Secondary | ICD-10-CM | POA: Diagnosis not present

## 2016-11-20 DIAGNOSIS — M5417 Radiculopathy, lumbosacral region: Secondary | ICD-10-CM | POA: Diagnosis not present

## 2016-11-20 DIAGNOSIS — M9904 Segmental and somatic dysfunction of sacral region: Secondary | ICD-10-CM | POA: Diagnosis not present

## 2016-11-20 DIAGNOSIS — M9903 Segmental and somatic dysfunction of lumbar region: Secondary | ICD-10-CM | POA: Diagnosis not present

## 2016-11-22 DIAGNOSIS — M9904 Segmental and somatic dysfunction of sacral region: Secondary | ICD-10-CM | POA: Diagnosis not present

## 2016-11-22 DIAGNOSIS — M9903 Segmental and somatic dysfunction of lumbar region: Secondary | ICD-10-CM | POA: Diagnosis not present

## 2016-11-22 DIAGNOSIS — M5417 Radiculopathy, lumbosacral region: Secondary | ICD-10-CM | POA: Diagnosis not present

## 2016-11-22 DIAGNOSIS — M9905 Segmental and somatic dysfunction of pelvic region: Secondary | ICD-10-CM | POA: Diagnosis not present

## 2016-11-23 DIAGNOSIS — M9903 Segmental and somatic dysfunction of lumbar region: Secondary | ICD-10-CM | POA: Diagnosis not present

## 2016-11-23 DIAGNOSIS — M5417 Radiculopathy, lumbosacral region: Secondary | ICD-10-CM | POA: Diagnosis not present

## 2016-11-23 DIAGNOSIS — M9905 Segmental and somatic dysfunction of pelvic region: Secondary | ICD-10-CM | POA: Diagnosis not present

## 2016-11-23 DIAGNOSIS — M9904 Segmental and somatic dysfunction of sacral region: Secondary | ICD-10-CM | POA: Diagnosis not present

## 2016-11-28 DIAGNOSIS — M5417 Radiculopathy, lumbosacral region: Secondary | ICD-10-CM | POA: Diagnosis not present

## 2016-11-28 DIAGNOSIS — M9904 Segmental and somatic dysfunction of sacral region: Secondary | ICD-10-CM | POA: Diagnosis not present

## 2016-11-28 DIAGNOSIS — M9903 Segmental and somatic dysfunction of lumbar region: Secondary | ICD-10-CM | POA: Diagnosis not present

## 2016-11-28 DIAGNOSIS — M9905 Segmental and somatic dysfunction of pelvic region: Secondary | ICD-10-CM | POA: Diagnosis not present

## 2016-11-29 DIAGNOSIS — M9904 Segmental and somatic dysfunction of sacral region: Secondary | ICD-10-CM | POA: Diagnosis not present

## 2016-11-29 DIAGNOSIS — M5417 Radiculopathy, lumbosacral region: Secondary | ICD-10-CM | POA: Diagnosis not present

## 2016-11-29 DIAGNOSIS — M9905 Segmental and somatic dysfunction of pelvic region: Secondary | ICD-10-CM | POA: Diagnosis not present

## 2016-11-29 DIAGNOSIS — M9903 Segmental and somatic dysfunction of lumbar region: Secondary | ICD-10-CM | POA: Diagnosis not present

## 2016-11-30 DIAGNOSIS — M9904 Segmental and somatic dysfunction of sacral region: Secondary | ICD-10-CM | POA: Diagnosis not present

## 2016-11-30 DIAGNOSIS — M5417 Radiculopathy, lumbosacral region: Secondary | ICD-10-CM | POA: Diagnosis not present

## 2016-11-30 DIAGNOSIS — M9903 Segmental and somatic dysfunction of lumbar region: Secondary | ICD-10-CM | POA: Diagnosis not present

## 2016-11-30 DIAGNOSIS — M9905 Segmental and somatic dysfunction of pelvic region: Secondary | ICD-10-CM | POA: Diagnosis not present

## 2016-12-04 DIAGNOSIS — M9904 Segmental and somatic dysfunction of sacral region: Secondary | ICD-10-CM | POA: Diagnosis not present

## 2016-12-04 DIAGNOSIS — M9905 Segmental and somatic dysfunction of pelvic region: Secondary | ICD-10-CM | POA: Diagnosis not present

## 2016-12-04 DIAGNOSIS — M9903 Segmental and somatic dysfunction of lumbar region: Secondary | ICD-10-CM | POA: Diagnosis not present

## 2016-12-04 DIAGNOSIS — M5417 Radiculopathy, lumbosacral region: Secondary | ICD-10-CM | POA: Diagnosis not present

## 2016-12-06 DIAGNOSIS — M5417 Radiculopathy, lumbosacral region: Secondary | ICD-10-CM | POA: Diagnosis not present

## 2016-12-06 DIAGNOSIS — M9905 Segmental and somatic dysfunction of pelvic region: Secondary | ICD-10-CM | POA: Diagnosis not present

## 2016-12-06 DIAGNOSIS — M9904 Segmental and somatic dysfunction of sacral region: Secondary | ICD-10-CM | POA: Diagnosis not present

## 2016-12-06 DIAGNOSIS — M9903 Segmental and somatic dysfunction of lumbar region: Secondary | ICD-10-CM | POA: Diagnosis not present

## 2016-12-11 DIAGNOSIS — M9903 Segmental and somatic dysfunction of lumbar region: Secondary | ICD-10-CM | POA: Diagnosis not present

## 2016-12-11 DIAGNOSIS — M9904 Segmental and somatic dysfunction of sacral region: Secondary | ICD-10-CM | POA: Diagnosis not present

## 2016-12-11 DIAGNOSIS — M9905 Segmental and somatic dysfunction of pelvic region: Secondary | ICD-10-CM | POA: Diagnosis not present

## 2016-12-11 DIAGNOSIS — M5417 Radiculopathy, lumbosacral region: Secondary | ICD-10-CM | POA: Diagnosis not present

## 2016-12-14 DIAGNOSIS — M5417 Radiculopathy, lumbosacral region: Secondary | ICD-10-CM | POA: Diagnosis not present

## 2016-12-14 DIAGNOSIS — M9905 Segmental and somatic dysfunction of pelvic region: Secondary | ICD-10-CM | POA: Diagnosis not present

## 2016-12-14 DIAGNOSIS — M9904 Segmental and somatic dysfunction of sacral region: Secondary | ICD-10-CM | POA: Diagnosis not present

## 2016-12-14 DIAGNOSIS — M9903 Segmental and somatic dysfunction of lumbar region: Secondary | ICD-10-CM | POA: Diagnosis not present

## 2016-12-18 DIAGNOSIS — M5417 Radiculopathy, lumbosacral region: Secondary | ICD-10-CM | POA: Diagnosis not present

## 2016-12-18 DIAGNOSIS — M9904 Segmental and somatic dysfunction of sacral region: Secondary | ICD-10-CM | POA: Diagnosis not present

## 2016-12-18 DIAGNOSIS — M9903 Segmental and somatic dysfunction of lumbar region: Secondary | ICD-10-CM | POA: Diagnosis not present

## 2016-12-18 DIAGNOSIS — M9905 Segmental and somatic dysfunction of pelvic region: Secondary | ICD-10-CM | POA: Diagnosis not present

## 2016-12-27 ENCOUNTER — Other Ambulatory Visit: Payer: Self-pay | Admitting: Dermatology

## 2016-12-27 DIAGNOSIS — L821 Other seborrheic keratosis: Secondary | ICD-10-CM | POA: Diagnosis not present

## 2016-12-27 DIAGNOSIS — L570 Actinic keratosis: Secondary | ICD-10-CM | POA: Diagnosis not present

## 2016-12-27 DIAGNOSIS — D229 Melanocytic nevi, unspecified: Secondary | ICD-10-CM | POA: Diagnosis not present

## 2016-12-27 DIAGNOSIS — D492 Neoplasm of unspecified behavior of bone, soft tissue, and skin: Secondary | ICD-10-CM | POA: Diagnosis not present

## 2016-12-27 DIAGNOSIS — L720 Epidermal cyst: Secondary | ICD-10-CM | POA: Diagnosis not present

## 2017-01-01 DIAGNOSIS — M238X2 Other internal derangements of left knee: Secondary | ICD-10-CM | POA: Diagnosis not present

## 2017-01-01 DIAGNOSIS — M25552 Pain in left hip: Secondary | ICD-10-CM | POA: Diagnosis not present

## 2017-01-01 DIAGNOSIS — M25551 Pain in right hip: Secondary | ICD-10-CM | POA: Diagnosis not present

## 2017-01-09 DIAGNOSIS — M238X2 Other internal derangements of left knee: Secondary | ICD-10-CM | POA: Diagnosis not present

## 2017-01-11 NOTE — Progress Notes (Signed)
HPI:   [name] is a 76 y.o. female, who is here today with her daughter for 2 months follow up.   She was seen last on 11/10/16.   GERD: Last OV Omeprazole increased from 20 mg daily to bid, she to this dose for 2 weeks and decreased it back to 20 mg at night after symptoms resolved.  Denies abdominal pain, nausea, vomiting, changes in bowel habits (Hx of IBS), blood in stool or melena.  Depression: Zoloft increased from 50 mg to 100 mg.  Symptoms greatly better.  Denies suicidal thoughts or changes in sleep. No side effects reported.  Concerns today: Her daughter has a list of questions related to other problems, no new ones.  -DM II, she follows with Dr Buddy Duty and has not followed since 01/2016. She is asking me if I can continue managing her DM II. Chronic, Dx 10+ years ago. She is on Metformin and Lantus.  Hx of peripheral neuropathy. She has had nutrition education x 2 but she does not follow recommendations.  Last eye exam in 05/2016. Denies abdominal pain, nausea,vomiting, polydipsia,polyuria, or polyphagia.   -Problems with urine: She tells me that she had recurrent UTI 6 years ago, she saw urologist for a few years. States that Cranberry pills helped her to "peeing", she does not have urgency or feeling like she cannot urinate. She ran out of cranberry pills while out of town and symptoms re-occurred: Urgency and difficulty starting urination. She resumed cranberry pill and symptoms resolved. She wonders if she can continue cranberry pills. She is not having any urinary symptom now, denies gross hematuria.   "A little bit of something going on" and "I do not want to see cardiologist" then adds that "the other day" she had an episode of headache, "not bad" and a "little bit" of numbness and tingling on her whole head. Symptoms lasted about an hour, she took Tylenol 500 mg and did lie down. She denies associated chest pain ,palpitation,or diaphoresis.    She has had similar episodes in the past, stress seems to be a precipitator factor.  Occasional palpitations and usually she takes an extra 1/2 Verapamil tab.  -Right middle finger,PIP joint pain. Hx of OA, she has not had recent injury. Mild limitation of ROM. She has been sewing and this seems to aggravate pain. Tylenol helps.  [place] also concerned about balance issues: Chronic. Hx of peripheral neuropathy,fibromyalgia,and lumbar spine stenosis.  She follows with ortho and according to pt,she has a right knee MRI schedule. She has a cane but does not like to use it. She has frequent falls, last one yesterday,unharmed.   Review of Systems  Constitutional: Positive for fatigue. Negative for activity change, appetite change and fever.  HENT: Negative for mouth sores, nosebleeds and trouble swallowing.   Eyes: Negative for redness and visual disturbance.  Respiratory: Negative for cough, shortness of breath and wheezing.   Cardiovascular: Positive for palpitations. Negative for chest pain and leg swelling.  Gastrointestinal: Negative for abdominal pain, nausea and vomiting.       Negative for changes in bowel habits.  Endocrine: Negative for cold intolerance, heat intolerance, polydipsia, polyphagia and polyuria.  Genitourinary: Negative for decreased urine volume, dysuria and hematuria.  Musculoskeletal: Positive for arthralgias, back pain, gait problem, joint swelling and myalgias.  Skin: Negative for rash and wound.  Neurological: Positive for numbness. Negative for syncope, facial asymmetry, speech difficulty and weakness.  Psychiatric/Behavioral: Negative for confusion. The patient is  nervous/anxious.       Current Outpatient Medications on File Prior to Visit  Medication Sig Dispense Refill  . aspirin EC 81 MG tablet Take 81 mg by mouth daily.    . Calcium Citrate-Vitamin D (CALCIUM CITRATE +D PO) Take 1 tablet by mouth daily.    . cholecalciferol (VITAMIN D) 1000  units tablet Take 1,000 Units by mouth daily.    . Cranberry 1000 MG CAPS Take 4,200 mg by mouth as needed. + Vit C 40 mg    . Cyanocobalamin (VITAMIN B-12 PO) Take 1 tablet by mouth daily.    . digoxin (LANOXIN) 0.125 MG tablet Take 0.125 mg by mouth daily.     Marland Kitchen ezetimibe (ZETIA) 10 MG tablet Take 1 tablet (10 mg total) by mouth daily. 90 tablet 2  . insulin glargine (LANTUS) 100 UNIT/ML injection Inject 60 Units into the skin at bedtime.     . metFORMIN (GLUCOPHAGE) 1000 MG tablet Take 1,000 mg by mouth 2 (two) times daily with a meal.     . metoprolol succinate (TOPROL-XL) 25 MG 24 hr tablet Take 1 tablet (25 mg total) by mouth daily. 90 tablet 1  . nitroGLYCERIN (NITROSTAT) 0.4 MG SL tablet Place 0.4 mg under the tongue every 5 (five) minutes as needed. As needed chest pain    . omeprazole (PRILOSEC) 20 MG capsule Take 1 capsule (20 mg total) by mouth 2 (two) times daily before a meal. 90 capsule 3  . ramipril (ALTACE) 10 MG capsule Take 1 capsule (10 mg total) by mouth daily. 90 capsule 1  . RELION INSULIN SYRINGE 1ML/31G 31G X 5/16" 1 ML MISC     . rOPINIRole (REQUIP) 1 MG tablet Take 1 tablet (1 mg total) by mouth at bedtime. 90 tablet 1  . sertraline (ZOLOFT) 100 MG tablet Take 1 tablet (100 mg total) by mouth daily. 30 tablet 3  . verapamil (VERELAN PM) 120 MG 24 hr capsule Take 120 mg by mouth as needed.    . verapamil (VERELAN PM) 240 MG 24 hr capsule Take 1 capsule (240 mg total) by mouth daily. 30 capsule 2   No current facility-administered medications on file prior to visit.      Past Medical History:  Diagnosis Date  . Anxiety   . Arthritis   . Depression   . Essential hypertension   . Fibromyalgia   . Hyperlipemia   . PSVT (paroxysmal supraventricular tachycardia) ([place])   . Reflux   . Restless leg syndrome   . Type 2 diabetes mellitus (HCC)    Allergies  Allergen Reactions  . Amoxil [Amoxicillin] Swelling    Lips Has patient had a PCN reaction causing immediate  rash, facial/tongue/throat swelling, SOB or lightheadedness with hypotension:YES Has patient had a PCN reaction causing severe rash involving mucus membranes or skin necrosis:No Has patient had a PCN reaction that required hospitalization:No Has patient had a PCN reaction occurring within the last 10 years:Yes. If all of the above answers are "NO", then may proceed with Cephalosporin use.   . Chocolate Hives  . Demerol Nausea And Vomiting  . Lisinopril Itching and Swelling    Social History   Socioeconomic History  . Marital status: Married    Spouse name: Glendell Docker  . Number of children: 2  . Years of education: None  . Highest education level: None  Social Needs  . Financial resource strain: None  . Food insecurity - worry: None  . Food insecurity - inability: None  .  Transportation needs - medical: None  . Transportation needs - non-medical: None  Occupational History  . None  Tobacco Use  . Smoking status: Never Smoker  . Smokeless tobacco: Never Used  Substance and Sexual Activity  . Alcohol use: No  . Drug use: No  . Sexual activity: None  Other Topics Concern  . None  Social History Narrative   Lives with husband.  Independent of ADLs.    Vitals:   01/12/17 1035  BP: 132/86  Pulse: 84  Resp: 16  Temp: 98 F (36.7 C)  SpO2: 95%   Body mass index is 35.85 kg/m.   Physical Exam  Nursing note and vitals reviewed. Constitutional: She is oriented to person, place, and time. She appears well-developed. No distress.  HENT:  Head: Normocephalic and atraumatic.  Mouth/Throat: Oropharynx is clear and moist and mucous membranes are normal.  Eyes: Conjunctivae are normal. Pupils are equal, round, and reactive to light.  Cardiovascular: Normal rate and regular rhythm.  No murmur heard. Pulses:      Dorsalis pedis pulses are 2+ on the right side, and 2+ on the left side.  Respiratory: Effort normal and breath sounds normal. No respiratory distress.  GI: Soft. She  exhibits no mass. There is no hepatomegaly. There is no tenderness.  Musculoskeletal: She exhibits no edema.  Heberden's node and Bouchard's nodes. Tender PIP joint of right middle finger, no erythema. No signs of synovitis.  Lymphadenopathy:    She has no cervical adenopathy.  Neurological: She is alert and oriented to person, place, and time. She has normal strength.  Unstable gait, not assisted.  Skin: Skin is warm. No erythema.  Psychiatric: Her mood appears anxious.  Well groomed, good eye contact.    ASSESSMENT AND PLAN:   Ms. Keviana was seen today for follow-up.  Diagnoses and all orders for this visit:  Lab Results  Component Value Date   HGBA1C 7.4 (H) 01/12/2017   Lab Results  Component Value Date   MICROALBUR 26.1 (H) 01/12/2017    Unstable gait Chronic. Possible etiologies discussed, including OA and peripheral neuropathy.Some of her medications can also increase risk of falls.  Fall precautions discussed. PT for fall prevention recommended, referral placed.  -     Ambulatory referral to Physical Therapy  Gastroesophageal reflux disease, esophagitis presence not specified  Improved. No changes in current management. GERD precautions discussed.  F/U in 6-12 months.  Generalized osteoarthritis of hand  Educated about Dx, treatment options, and prognosis. Tylenol OTC 500 mg tid as needed. Tumeric and fish oil may also help.  Uncontrolled type 2 diabetes mellitus with neurologic complication, with long-term current use of insulin ([place])  HgA1C 01/2016 was 7.7. No changes in current management, will adjust treatment accordingly.  Regular exercise and healthy diet with avoidance of added sugar food intake is an important part of treatment and recommended. Annual eye exam, periodic dental and foot care recommended. F/U in 5  months  -     Hemoglobin A1c -     Microalbumin / creatinine urine ratio  Headache, unspecified headache type  ? Tension  headache. Resolved. Instructed about warning signs. She is not interested in further work up for now.   She follows with neurosurgeon for spinal stenosis.  Mild episode of recurrent major depressive disorder (HCC)  Improved. Still great deal of anxiety. No changes in current management for now. Instructed about warning signs. F/U in 3 months.   In regard to urinary symptoms, all resolved,continue cranberry  pills daily.     -Ms. ALEXZIA KASLER was advised to return sooner than planned today if new concerns arise.       Davonta Stroot G. [name], MD  [facility]. [place] office.

## 2017-01-12 ENCOUNTER — Ambulatory Visit: Payer: PPO | Admitting: Family Medicine

## 2017-01-12 ENCOUNTER — Encounter: Payer: Self-pay | Admitting: Family Medicine

## 2017-01-12 ENCOUNTER — Other Ambulatory Visit: Payer: Self-pay | Admitting: *Deleted

## 2017-01-12 VITALS — BP 132/86 | HR 84 | Temp 98.0°F | Resp 16 | Ht 63.0 in | Wt 202.4 lb

## 2017-01-12 DIAGNOSIS — F330 Major depressive disorder, recurrent, mild: Secondary | ICD-10-CM

## 2017-01-12 DIAGNOSIS — IMO0002 Reserved for concepts with insufficient information to code with codable children: Secondary | ICD-10-CM

## 2017-01-12 DIAGNOSIS — R51 Headache: Secondary | ICD-10-CM

## 2017-01-12 DIAGNOSIS — R2681 Unsteadiness on feet: Secondary | ICD-10-CM | POA: Insufficient documentation

## 2017-01-12 DIAGNOSIS — R519 Headache, unspecified: Secondary | ICD-10-CM

## 2017-01-12 DIAGNOSIS — E1165 Type 2 diabetes mellitus with hyperglycemia: Secondary | ICD-10-CM

## 2017-01-12 DIAGNOSIS — E1149 Type 2 diabetes mellitus with other diabetic neurological complication: Secondary | ICD-10-CM | POA: Diagnosis not present

## 2017-01-12 DIAGNOSIS — Z794 Long term (current) use of insulin: Secondary | ICD-10-CM | POA: Diagnosis not present

## 2017-01-12 DIAGNOSIS — K219 Gastro-esophageal reflux disease without esophagitis: Secondary | ICD-10-CM | POA: Diagnosis not present

## 2017-01-12 DIAGNOSIS — M159 Polyosteoarthritis, unspecified: Secondary | ICD-10-CM

## 2017-01-12 LAB — MICROALBUMIN / CREATININE URINE RATIO
Creatinine,U: 184.8 mg/dL
Microalb Creat Ratio: 14.1 mg/g (ref 0.0–30.0)
Microalb, Ur: 26.1 mg/dL — ABNORMAL HIGH (ref 0.0–1.9)

## 2017-01-12 LAB — HEMOGLOBIN A1C: Hgb A1c MFr Bld: 7.4 % — ABNORMAL HIGH (ref 4.6–6.5)

## 2017-01-12 NOTE — Patient Instructions (Signed)
A few things to remember from today's visit:   Gastroesophageal reflux disease, esophagitis presence not specified  Mild episode of recurrent major depressive disorder ([name])  Controlled type 2 diabetes mellitus with other neurologic complication, with long-term current use of insulin ([name]) - Plan: Hemoglobin A1c, Microalbumin / creatinine urine ratio  Unstable gait - Plan: Ambulatory referral to Physical Therapy   Please be sure medication list is accurate. If a new problem present, please set up appointment sooner than planned today.

## 2017-01-13 ENCOUNTER — Encounter: Payer: Self-pay | Admitting: Family Medicine

## 2017-01-24 ENCOUNTER — Other Ambulatory Visit: Payer: Self-pay | Admitting: *Deleted

## 2017-01-25 ENCOUNTER — Ambulatory Visit: Payer: PPO | Attending: Family Medicine | Admitting: Physical Therapy

## 2017-01-25 ENCOUNTER — Encounter: Payer: Self-pay | Admitting: Physical Therapy

## 2017-01-25 DIAGNOSIS — M6281 Muscle weakness (generalized): Secondary | ICD-10-CM | POA: Insufficient documentation

## 2017-01-25 DIAGNOSIS — R2681 Unsteadiness on feet: Secondary | ICD-10-CM | POA: Diagnosis not present

## 2017-01-25 NOTE — Therapy (Signed)
[facility] [place], [place], [address] Phone: [phone]   Fax:  [phone]  Physical Therapy Evaluation  Patient Details  Name: [name] MRN: [id number] Date of Birth: Mar 19, 1940 Referring Provider: Betty [name] MD   Encounter Date: 01/25/2017  PT End of Session - 01/25/17 1658    Visit Number  1    Number of Visits  16    Date for PT Re-Evaluation  03/26/17    PT Start Time  0145    PT Stop Time  0233    PT Time Calculation (min)  48 min    Activity Tolerance  Patient tolerated treatment well    Behavior During Therapy  [facility] for tasks assessed/performed       Past Medical History:  Diagnosis Date  . Anxiety   . Arthritis   . Depression   . Essential hypertension   . Fibromyalgia   . Hyperlipemia   . PSVT (paroxysmal supraventricular tachycardia) ([place])   . Reflux   . Restless leg syndrome   . Type 2 diabetes mellitus ([place])     Past Surgical History:  Procedure Laterality Date  . ABDOMINAL HYSTERECTOMY    . ANKLE SURGERY    . BACK SURGERY     T12-L1 fusion, Anterior Cervical fusion  . CARDIAC CATHETERIZATION N/A 02/16/2015   Procedure: Left Heart Cath and Coronary Angiography;  Surgeon: Sherren Mocha, MD;  Location: [facility];  Service: Cardiovascular;  Laterality: N/A;  . CHOLECYSTECTOMY    . COLONOSCOPY    . ESOPHAGOGASTRODUODENOSCOPY  12/21/2010   Procedure: ESOPHAGOGASTRODUODENOSCOPY (EGD);  Surgeon: Landry Dyke, MD;  Location: [facility];  Service: Endoscopy;  Laterality: N/A;  . EXCISION MORTON'S NEUROMA    . FRACTURE SURGERY    . KNEE SURGERY     x 3  . NECK SURGERY    . ROTATOR CUFF REPAIR    . TONSILLECTOMY    . UPPER GASTROINTESTINAL ENDOSCOPY    . WRIST SURGERY      There were no vitals filed for this visit.   Subjective Assessment - 01/25/17 1653    Subjective  The patient presents to OPPT wiht c/o balance losses.  She reports 15 falls over the last 6 months.   She states that this began about a year ago after having to lift her feeble husband from the floor.  She now uses a standing cane at all times for safety.  The patient reports episodes of her right knee buckling.    Pertinent History  Neck, back, right RTC, left wrist and left ankle surgery.  Athritis and DM.    Patient Stated Goals  I would like to be more mobile, walk better and travel.    Currently in Pain?  Yes    Pain Score  6     Pain Location  Back    Pain Orientation  Mid    Pain Descriptors / Indicators  Aching "hurts"    Pain Type  Chronic pain    Pain Onset  More than a month ago    Pain Frequency  Constant         [facility] PT Assessment - 01/25/17 0001      Assessment   Medical Diagnosis  Unstable giat.    Referring Provider  Betty [name] MD    Onset Date/Surgical Date  -- One year.      Precautions   Precautions  Fall      Restrictions  Weight Bearing Restrictions  No      Balance Screen   Has the patient fallen in the past 6 months  Yes    How many times?  -- 15.    Has the patient had a decrease in activity level because of a fear of falling?   Yes    Is the patient reluctant to leave their home because of a fear of falling?   Yes      [place] residence      Prior Function   Level of Independence  Independent      Posture/Postural Control   Posture/Postural Control  Postural limitations    Postural Limitations  Rounded Shoulders;Forward head;Decreased lumbar lordosis      ROM / Strength   AROM / PROM / Strength  AROM;Strength      AROM   Overall AROM Comments  Functional LE ROM.      Strength   Overall Strength Comments  Right hip flexion= 4-/5, abduction= 4 to 4+/5.  Left hip flexion= 4-/5 and abduction= 4-/5.      Ambulation/Gait   Gait Comments  Patient walks with cane with wide BOS and increased toe out with right knee genu valgum      Standardized Balance Assessment   Standardized Balance Assessment   Berg Balance Test      Berg Balance Test   Sit to Stand  Able to stand  independently using hands    Standing Unsupported  Able to stand 30 seconds unsupported    Sitting with Back Unsupported but Feet Supported on Floor or Stool  Able to sit safely and securely 2 minutes    Stand to Sit  Controls descent by using hands    Transfers  Able to transfer safely, definite need of hands    Standing Unsupported with Eyes Closed  Able to stand 3 seconds    Standing Ubsupported with Feet Together  Needs help to attain position but able to stand for 30 seconds with feet together    From Standing, Reach Forward with Outstretched Arm  Can reach forward >12 cm safely (5")    From Standing Position, Pick up Object from Floor  Unable to pick up shoe, but reaches 2-5 cm (1-2") from shoe and balances independently    From Standing Position, Turn to Look Behind Over each Shoulder  Looks behind one side only/other side shows less weight shift    Turn 360 Degrees  Able to turn 360 degrees safely but slowly    Standing Unsupported, Alternately Place Feet on Step/Stool  Able to complete 4 steps without aid or supervision    Standing Unsupported, One Foot in Front  Needs help to step but can hold 15 seconds    Standing on One Leg  Able to lift leg independently and hold equal to or more than 3 seconds    Total Score  33    Berg comment:  -- (+) Romberg test.             Objective measurements completed on examination: See above findings.      [facility] Adult PT Treatment/Exercise - 01/25/17 0001      Exercises   Exercises  Knee/Hip      Knee/Hip Exercises: Aerobic   Nustep  Level 3 x 15 minutes with cadenence above 70 steps a minute.               PT Short Term Goals -  01/25/17 1731      PT SHORT TERM GOAL #1   Title  Independent with an initial HEP.    Time  4    Period  Weeks    Status  New      PT SHORT TERM GOAL #2   Title  Increase Berg score to 40/56.    Time  4    Period   Weeks    Status  New        PT Long Term Goals - 01/25/17 1731      PT LONG TERM GOAL #1   Title  Independent with an advanced HEP.    Time  8    Period  Weeks    Status  New      PT LONG TERM GOAL #2   Title  Improve Berg score to 49-50/56.    Time  8    Period  Weeks    Status  New      PT LONG TERM GOAL #3   Title  Negative Romberg test.      PT LONG TERM GOAL #4   Title  Improve bilateral hip strength to 5/5 to increase stability for walking and functional activites.    Time  8    Period  Weeks    Status  New      PT LONG TERM GOAL #5   Title  Patient walk in clinic 500 feet and up/down 4 stairs without loss of balance with a straight cane.    Time  8    Period  Weeks    Status  New             Plan - 01/25/17 1722    Clinical Impression Statement  The patient presents to OPPT with c/o balance losses.  She has fallen many times over the last 15 months.  She has bilateral hip weakness.  She has multiple comorbidites that make her condition much more complex.  She has a Berg score of 33/56 and a positive Romberg test.  The patient will benefit from skiled physical therapy intervention.    History and Personal Factors relevant to plan of care:  Multiple comorbidites.      Clinical Presentation  Evolving    Clinical Presentation due to:  Falls continue.    Clinical Decision Making  Moderate    Rehab Potential  Good    Clinical Impairments Affecting Rehab Potential  [place].    PT Frequency  2x / week    PT Duration  8 weeks    PT Treatment/Interventions  Gait training;Functional mobility training;Therapeutic activities;Therapeutic exercise;Balance training    PT Next Visit Plan  Please work on core strengthening; bilateral hip strengthening; balance activites.    Consulted and Agree with Plan of Care  Patient       Patient will benefit from skilled therapeutic intervention in order to improve the following deficits and impairments:  Abnormal gait,  Decreased activity tolerance, Decreased mobility, Decreased strength, Decreased balance, Pain, Postural dysfunction  Visit Diagnosis: Unsteadiness on feet - Plan: PT plan of care cert/re-cert  Muscle weakness (generalized) - Plan: PT plan of care cert/re-cert  G-Codes - 22/02/54 1659    Functional Assessment Tool Used (Outpatient Only)  Clinical judgement...    Functional Limitation  Mobility: Walking and moving around    Mobility: Walking and Moving Around Current Status [phone])  At least 40 percent but less than 60 percent impaired, limited or restricted    Mobility:  Walking and Moving Around Goal Status [phone])  At least 20 percent but less than 40 percent impaired, limited or restricted        Problem List Patient Active Problem List   Diagnosis Date Noted  . Unstable gait 01/12/2017  . Generalized osteoarthritis of hand 01/12/2017  . RLS (restless legs syndrome) 11/09/2016  . GERD (gastroesophageal reflux disease) 07/13/2016  . Class 2 severe obesity due to excess calories with serious comorbidity and body mass index (BMI) of 36.0 to 36.9 in adult ([place]) 06/01/2016  . Vitamin D deficiency 06/01/2016  . Vitamin B12 deficiency 03/27/2016  . Chronic cough 03/16/2016  . Left foot pain 03/16/2016  . Demand ischemia ([place]) 02/14/2015  . Degenerative drusen 07/13/2013  . Type 2 diabetes mellitus without ophthalmic manifestations ([place]) 07/13/2013  . DM (diabetes mellitus) type II controlled, neurological manifestation ([place]) 05/07/2012  . Paroxysmal atrial tachycardia ([place]) 05/07/2012  . Essential hypertension, benign 05/07/2012  . Depression 05/07/2012  . Hyperlipidemia 05/07/2012  . Fibromyalgia 05/07/2012    Cebert Dettmann, [place] MPT 01/25/2017, 5:42 PM  [facility] [place], [place], [address] Phone: [phone]   Fax:  [phone]  Name: [name] MRN: [id number] Date of Birth: 1940/07/01

## 2017-01-26 ENCOUNTER — Other Ambulatory Visit: Payer: Self-pay | Admitting: *Deleted

## 2017-01-26 ENCOUNTER — Telehealth: Payer: Self-pay | Admitting: *Deleted

## 2017-01-26 DIAGNOSIS — Z794 Long term (current) use of insulin: Secondary | ICD-10-CM

## 2017-01-26 DIAGNOSIS — E1140 Type 2 diabetes mellitus with diabetic neuropathy, unspecified: Secondary | ICD-10-CM

## 2017-01-26 MED ORDER — "RELION INSULIN SYRINGE 31G X 5/16"" 1 ML MISC": 1 refills | Status: DC

## 2017-01-26 NOTE — Telephone Encounter (Signed)
It is Ok to sent Rx for syringes as requested. Thanks, BJ

## 2017-01-26 NOTE — Telephone Encounter (Signed)
Request refill on Relion Insulin Syringe 34ml/31g/6MM  [place], [place]- [phone]

## 2017-01-29 ENCOUNTER — Encounter: Payer: Self-pay | Admitting: Physical Therapy

## 2017-01-29 ENCOUNTER — Ambulatory Visit: Payer: PPO | Admitting: Physical Therapy

## 2017-01-29 DIAGNOSIS — M6281 Muscle weakness (generalized): Secondary | ICD-10-CM

## 2017-01-29 DIAGNOSIS — R2681 Unsteadiness on feet: Secondary | ICD-10-CM

## 2017-01-29 NOTE — Therapy (Signed)
[facility] [place], [place], [address] Phone: [phone]   Fax:  [phone]  Physical Therapy Treatment  Patient Details  Name: [name] MRN: [id number] Date of Birth: Jun 20, 1940 Referring Provider: Betty [name] MD   Encounter Date: 01/29/2017  PT End of Session - 01/29/17 1018    Visit Number  2    Number of Visits  16    Date for PT Re-Evaluation  03/26/17    PT Start Time  4034    PT Stop Time  7425 Treatment interrupted due to bathroom use..per patient "I have IBS."    PT Time Calculation (min)  39 min       Past Medical History:  Diagnosis Date  . Anxiety   . Arthritis   . Depression   . Essential hypertension   . Fibromyalgia   . Hyperlipemia   . PSVT (paroxysmal supraventricular tachycardia) ([place])   . Reflux   . Restless leg syndrome   . Type 2 diabetes mellitus ([place])     Past Surgical History:  Procedure Laterality Date  . ABDOMINAL HYSTERECTOMY    . ANKLE SURGERY    . BACK SURGERY     T12-L1 fusion, Anterior Cervical fusion  . CARDIAC CATHETERIZATION N/A 02/16/2015   Procedure: Left Heart Cath and Coronary Angiography;  Surgeon: Sherren Mocha, MD;  Location: [facility];  Service: Cardiovascular;  Laterality: N/A;  . CHOLECYSTECTOMY    . COLONOSCOPY    . ESOPHAGOGASTRODUODENOSCOPY  12/21/2010   Procedure: ESOPHAGOGASTRODUODENOSCOPY (EGD);  Surgeon: Landry Dyke, MD;  Location: [facility];  Service: Endoscopy;  Laterality: N/A;  . EXCISION MORTON'S NEUROMA    . FRACTURE SURGERY    . KNEE SURGERY     x 3  . NECK SURGERY    . ROTATOR CUFF REPAIR    . TONSILLECTOMY    . UPPER GASTROINTESTINAL ENDOSCOPY    . WRIST SURGERY      There were no vitals filed for this visit.  Subjective Assessment - 01/29/17 1026    Subjective  No new complaints.    Patient Stated Goals  I would like to be more mobile, walk better and travel.                      [facility] Adult  PT Treatment/Exercise - 01/29/17 0001      Exercises   Exercises  Knee/Hip      Knee/Hip Exercises: Aerobic   Nustep  Level 3 x 20 minutes.      Knee/Hip Exercises: Machines for Strengthening   Cybex Knee Flexion  30# x 3 minutes done slowly with emphasis on eccentric control.          Balance Exercises - 01/29/17 1028      Balance Exercises: Standing   Rockerboard  -- 6 minutes with draw-in in parallel bars.    Overall Comments  -- Inverted BOSU x 3 minutes.          PT Short Term Goals - 01/25/17 1731      PT SHORT TERM GOAL #1   Title  Independent with an initial HEP.    Time  4    Period  Weeks    Status  New      PT SHORT TERM GOAL #2   Title  Increase Berg score to 40/56.    Time  4    Period  Weeks    Status  New  PT Long Term Goals - 01/25/17 1731      PT LONG TERM GOAL #1   Title  Independent with an advanced HEP.    Time  8    Period  Weeks    Status  New      PT LONG TERM GOAL #2   Title  Improve Berg score to 49-50/56.    Time  8    Period  Weeks    Status  New      PT LONG TERM GOAL #3   Title  Negative Romberg test.      PT LONG TERM GOAL #4   Title  Improve bilateral hip strength to 5/5 to increase stability for walking and functional activites.    Time  8    Period  Weeks    Status  New      PT LONG TERM GOAL #5   Title  Patient walk in clinic 500 feet and up/down 4 stairs without loss of balance with a straight cane.    Time  8    Period  Weeks    Status  New              Patient will benefit from skilled therapeutic intervention in order to improve the following deficits and impairments:     Visit Diagnosis: Muscle weakness (generalized)  Unsteadiness on feet     Problem List Patient Active Problem List   Diagnosis Date Noted  . Unstable gait 01/12/2017  . Generalized osteoarthritis of hand 01/12/2017  . RLS (restless legs syndrome) 11/09/2016  . GERD (gastroesophageal reflux disease) 07/13/2016   . Class 2 severe obesity due to excess calories with serious comorbidity and body mass index (BMI) of 36.0 to 36.9 in adult ([place]) 06/01/2016  . Vitamin D deficiency 06/01/2016  . Vitamin B12 deficiency 03/27/2016  . Chronic cough 03/16/2016  . Left foot pain 03/16/2016  . Demand ischemia ([place]) 02/14/2015  . Degenerative drusen 07/13/2013  . Type 2 diabetes mellitus without ophthalmic manifestations ([place]) 07/13/2013  . DM (diabetes mellitus) type II controlled, neurological manifestation ([place]) 05/07/2012  . Paroxysmal atrial tachycardia ([place]) 05/07/2012  . Essential hypertension, benign 05/07/2012  . Depression 05/07/2012  . Hyperlipidemia 05/07/2012  . Fibromyalgia 05/07/2012    Jniya Madara, [place] MPT 01/29/2017, 11:11 AM  [facility] [address] [place], [place], [address] Phone: [phone]   Fax:  [phone]  Name: [name] MRN: [id number] Date of Birth: 1940-05-19

## 2017-01-30 MED ORDER — "RELION INSULIN SYRINGE 31G X 5/16"" 1 ML MISC": 1 refills | Status: AC

## 2017-01-30 NOTE — Telephone Encounter (Signed)
Rx sent to pharmacy   

## 2017-01-30 NOTE — Addendum Note (Signed)
Addended by: Zacarias Pontes on: 01/30/2017 02:38 PM   Modules accepted: Orders

## 2017-01-31 ENCOUNTER — Encounter: Payer: PPO | Admitting: Physical Therapy

## 2017-02-08 ENCOUNTER — Other Ambulatory Visit: Payer: Self-pay | Admitting: Family Medicine

## 2017-02-23 DIAGNOSIS — M25562 Pain in left knee: Secondary | ICD-10-CM | POA: Diagnosis not present

## 2017-02-23 DIAGNOSIS — M1712 Unilateral primary osteoarthritis, left knee: Secondary | ICD-10-CM | POA: Diagnosis not present

## 2017-02-27 ENCOUNTER — Encounter: Payer: Self-pay | Admitting: Physical Therapy

## 2017-02-27 ENCOUNTER — Ambulatory Visit: Payer: PPO | Attending: Orthopedic Surgery | Admitting: Physical Therapy

## 2017-02-27 DIAGNOSIS — M25562 Pain in left knee: Secondary | ICD-10-CM | POA: Insufficient documentation

## 2017-02-27 DIAGNOSIS — G8929 Other chronic pain: Secondary | ICD-10-CM | POA: Diagnosis not present

## 2017-02-27 DIAGNOSIS — R2681 Unsteadiness on feet: Secondary | ICD-10-CM | POA: Diagnosis not present

## 2017-02-27 NOTE — Therapy (Signed)
[facility] [place], [place], [address] Phone: [phone]   Fax:  [phone]  Physical Therapy Treatment  Patient Details  Name: [name] MRN: [id number] Date of Birth: 1940/09/05 Referring Provider: Betty [name] MD   Encounter Date: 02/27/2017  PT End of Session - 02/27/17 0930    Visit Number  1    Number of Visits  8    Date for PT Re-Evaluation  03/27/17    PT Start Time  0858    PT Stop Time  0930    PT Time Calculation (min)  32 min    Activity Tolerance  Patient tolerated treatment well    Behavior During Therapy  [facility] for tasks assessed/performed       Past Medical History:  Diagnosis Date  . Anxiety   . Arthritis   . Depression   . Essential hypertension   . Fibromyalgia   . Hyperlipemia   . PSVT (paroxysmal supraventricular tachycardia) ([place])   . Reflux   . Restless leg syndrome   . Type 2 diabetes mellitus ([employer])     Past Surgical History:  Procedure Laterality Date  . ABDOMINAL HYSTERECTOMY    . ANKLE SURGERY    . BACK SURGERY     T12-L1 fusion, Anterior Cervical fusion  . CARDIAC CATHETERIZATION N/A 02/16/2015   Procedure: Left Heart Cath and Coronary Angiography;  Surgeon: Sherren Mocha, MD;  Location: [facility];  Service: Cardiovascular;  Laterality: N/A;  . CHOLECYSTECTOMY    . COLONOSCOPY    . ESOPHAGOGASTRODUODENOSCOPY  12/21/2010   Procedure: ESOPHAGOGASTRODUODENOSCOPY (EGD);  Surgeon: Landry Dyke, MD;  Location: [facility];  Service: Endoscopy;  Laterality: N/A;  . EXCISION MORTON'S NEUROMA    . FRACTURE SURGERY    . KNEE SURGERY     x 3  . NECK SURGERY    . ROTATOR CUFF REPAIR    . TONSILLECTOMY    . UPPER GASTROINTESTINAL ENDOSCOPY    . WRIST SURGERY      There were no vitals filed for this visit.  Subjective Assessment - 02/27/17 0932    Subjective  The patient re-presents to OPPT last seen 29 days ago.  Her order is regarding left knee OA.  She is to  have a toatl knee replacement in 4 weeks and the surgeon wants her stronger.  Her pain will rise to a 8+/10 after longer walks but she states she tries to avoid this. See "therapy note" section for details.    Pertinent History  Neck, back, right RTC, left wrist and left ankle surgery.  Athritis and DM.    Patient Stated Goals  I would like to be more mobile, walk better and travel.    Pain Score  4     Pain Location  Knee    Pain Orientation  Left    Pain Descriptors / Indicators  Aching    Pain Type  Chronic pain    Pain Onset  More than a month ago    Pain Frequency  Constant      Assessment:  AROM of left knee:  0 to 126 degerees.  3+/5 left hip abduction and flexion.  Left hip ER= 4 to 4+/5.  Left knee flexion and extension= 4+/5.  Left ankle strength= 5/5.  Crepitus noted with left knee AROM and she has a Positive Patello-femoral compression test.  Her left knee is remarkable for genu valgum and her left patella tilts laterally.  Her gait  is unstable with an increased BOS and increase toe out and decreased step length.  She needs to be using a cane constantly.  She states "it's in the car."  She transfers from sit to stand with the need of armrests and she does so in obvious pain.                        PT Education - 02/27/17 0935    Education provided  Yes    Education Details  SLR    Person(s) Educated  Patient    Methods  Explanation;Demonstration    Comprehension  Verbalized understanding;Returned demonstration       PT Short Term Goals - 02/27/17 0947      PT SHORT TERM GOAL #1   Title  Independent with an initial HEP.    Time  2    Period  Weeks    Status  New        PT Long Term Goals - 02/27/17 0947      PT LONG TERM GOAL #1   Title  Independent with an advanced HEP.    Time  4    Status  New            Plan - 02/27/17 0941    Clinical Impression Statement  The patient presents with chronic left knee pain with pain increasing wiht  prolonged walking.  She has full active left knee range of motion but she is weak especially in the left hip musculature and to a lesser degree her left knee.  She has a positive left patello-femoral compression test.  Her pain and deficits hs impaired her mobility  Plan is to see the patient 4 weeks pre-op for strengthening.    PT Frequency  2x / week    PT Duration  4 weeks    PT Treatment/Interventions  Therapeutic activities;Therapeutic exercise;Gait training;Patient/family education    PT Next Visit Plan  Left SLR; hip abduction; "clamshell"; OKC/CK[facility] PAIN-FREE left quad strengtehning.      Consulted and Agree with Plan of Care  Patient       Patient will benefit from skilled therapeutic intervention in order to improve the following deficits and impairments:  Abnormal gait, Decreased activity tolerance, Decreased mobility, Decreased strength, Decreased balance, Pain, Postural dysfunction  Visit Diagnosis: Chronic pain of left knee - Plan: PT plan of care cert/re-cert  Unsteadiness on feet - Plan: PT plan of care cert/re-cert     Problem List Patient Active Problem List   Diagnosis Date Noted  . Unstable gait 01/12/2017  . Generalized osteoarthritis of hand 01/12/2017  . RLS (restless legs syndrome) 11/09/2016  . GERD (gastroesophageal reflux disease) 07/13/2016  . Class 2 severe obesity due to excess calories with serious comorbidity and body mass index (BMI) of 36.0 to 36.9 in adult ([place]) 06/01/2016  . Vitamin D deficiency 06/01/2016  . Vitamin B12 deficiency 03/27/2016  . Chronic cough 03/16/2016  . Left foot pain 03/16/2016  . Demand ischemia ([place]) 02/14/2015  . Degenerative drusen 07/13/2013  . Type 2 diabetes mellitus without ophthalmic manifestations ([place]) 07/13/2013  . DM (diabetes mellitus) type II controlled, neurological manifestation ([place]) 05/07/2012  . Paroxysmal atrial tachycardia ([place]) 05/07/2012  . Essential hypertension, benign 05/07/2012  . Depression  05/07/2012  . Hyperlipidemia 05/07/2012  . Fibromyalgia 05/07/2012    Luciann Gossett, [place] MPT 02/27/2017, 9:54 AM  [facility] [address] [place], [place], [address] Phone: [phone]  Fax:  [phone]  Name: MAUDELL STANBROUGH MRN: [id number] Date of Birth: 1941-01-11

## 2017-03-02 ENCOUNTER — Ambulatory Visit: Payer: PPO | Admitting: Physical Therapy

## 2017-03-02 DIAGNOSIS — G8929 Other chronic pain: Secondary | ICD-10-CM

## 2017-03-02 DIAGNOSIS — M25562 Pain in left knee: Principal | ICD-10-CM

## 2017-03-02 NOTE — Therapy (Signed)
[facility] [place], [place], [address] Phone: [phone]   Fax:  [phone]  Physical Therapy Treatment  Patient Details  Name: [name] MRN: [id number] Date of Birth: 1940-08-29 Referring Provider: Betty [name] MD   Encounter Date: 03/02/2017  PT End of Session - 03/02/17 0907    Visit Number  2    Number of Visits  8    Date for PT Re-Evaluation  03/27/17    PT Start Time  0908    PT Stop Time  0950    PT Time Calculation (min)  42 min    Activity Tolerance  Patient tolerated treatment well    Behavior During Therapy  [facility] for tasks assessed/performed       Past Medical History:  Diagnosis Date  . Anxiety   . Arthritis   . Depression   . Essential hypertension   . Fibromyalgia   . Hyperlipemia   . PSVT (paroxysmal supraventricular tachycardia) ([place])   . Reflux   . Restless leg syndrome   . Type 2 diabetes mellitus ([employer])     Past Surgical History:  Procedure Laterality Date  . ABDOMINAL HYSTERECTOMY    . ANKLE SURGERY    . BACK SURGERY     T12-L1 fusion, Anterior Cervical fusion  . CARDIAC CATHETERIZATION N/A 02/16/2015   Procedure: Left Heart Cath and Coronary Angiography;  Surgeon: Sherren Mocha, MD;  Location: [place] [facility];  Service: Cardiovascular;  Laterality: N/A;  . CHOLECYSTECTOMY    . COLONOSCOPY    . ESOPHAGOGASTRODUODENOSCOPY  12/21/2010   Procedure: ESOPHAGOGASTRODUODENOSCOPY (EGD);  Surgeon: Landry Dyke, MD;  Location: [facility];  Service: Endoscopy;  Laterality: N/A;  . EXCISION MORTON'S NEUROMA    . FRACTURE SURGERY    . KNEE SURGERY     x 3  . NECK SURGERY    . ROTATOR CUFF REPAIR    . TONSILLECTOMY    . UPPER GASTROINTESTINAL ENDOSCOPY    . WRIST SURGERY      There were no vitals filed for this visit.  Subjective Assessment - 03/02/17 0905    Subjective  Patient inquired about whether or not  the clinic had any braces available that she could borrow for her  knee. She states OTC braces are not big enough for her.    Pertinent History  Neck, back, right RTC, left wrist and left ankle surgery.  Athritis and DM.    Patient Stated Goals  I would like to be more mobile, walk better and travel.    Currently in Pain?  Yes    Pain Score  7     Pain Location  Knee    Pain Orientation  Left    Pain Descriptors / Indicators  Aching    Pain Type  Chronic pain                      [facility] Adult PT Treatment/Exercise - 03/02/17 0001      Knee/Hip Exercises: Stretches   Other Knee/Hip Stretches  supine "C" stretch for L QL/lumbar x 60 seconds      Knee/Hip Exercises: Aerobic   Nustep  L4 x 15 min goal of 80 steps/min      Knee/Hip Exercises: Seated   Long Arc Quad  AROM;10 reps dynamic HS stretch/cues to not lean back      Knee/Hip Exercises: Supine   Bridges  Strengthening;Both;2 sets;15 reps    Other Supine Knee/Hip Exercises  marching left hip 1x7, 2x15      Knee/Hip Exercises: Sidelying   Clams  Left 1x20      Manual Therapy   Manual Therapy  Myofascial release    Myofascial Release  TPR to L QL             PT Education - 03/02/17 1239    Education Details  HEP    Person(s) Educated  Patient    Methods  Explanation;Demonstration;Verbal cues;Tactile cues;Handout    Comprehension  Verbalized understanding;Returned demonstration       PT Short Term Goals - 02/27/17 0947      PT SHORT TERM GOAL #1   Title  Independent with an initial HEP.    Time  2    Period  Weeks    Status  New        PT Long Term Goals - 02/27/17 0947      PT LONG TERM GOAL #1   Title  Independent with an advanced HEP.    Time  4    Status  New            Plan - 03/02/17 1240    Clinical Impression Statement  Patient did very well with TE today. She has significant L hip weakness, but tolerated strengthening well. She has a tight L QL likely due to hip hike with ambulation and responsded well to QL release and stretch. HEP was  progressed.    PT Treatment/Interventions  Therapeutic activities;Therapeutic exercise;Gait training;Patient/family education    PT Next Visit Plan  Continue Bil hip/knee strengthening; stretching as indicated.    PT Home Exercise Plan  supine marching, bridge, SDLY clam, QL stretch in supine    Consulted and Agree with Plan of Care  Patient       Patient will benefit from skilled therapeutic intervention in order to improve the following deficits and impairments:  Abnormal gait, Decreased activity tolerance, Decreased mobility, Decreased strength, Decreased balance, Pain, Postural dysfunction  Visit Diagnosis: Chronic pain of left knee     Problem List Patient Active Problem List   Diagnosis Date Noted  . Unstable gait 01/12/2017  . Generalized osteoarthritis of hand 01/12/2017  . RLS (restless legs syndrome) 11/09/2016  . GERD (gastroesophageal reflux disease) 07/13/2016  . Class 2 severe obesity due to excess calories with serious comorbidity and body mass index (BMI) of 36.0 to 36.9 in adult ([place]) 06/01/2016  . Vitamin D deficiency 06/01/2016  . Vitamin B12 deficiency 03/27/2016  . Chronic cough 03/16/2016  . Left foot pain 03/16/2016  . Demand ischemia ([place]) 02/14/2015  . Degenerative drusen 07/13/2013  . Type 2 diabetes mellitus without ophthalmic manifestations ([place]) 07/13/2013  . DM (diabetes mellitus) type II controlled, neurological manifestation ([place]) 05/07/2012  . Paroxysmal atrial tachycardia ([place]) 05/07/2012  . Essential hypertension, benign 05/07/2012  . Depression 05/07/2012  . Hyperlipidemia 05/07/2012  . Fibromyalgia 05/07/2012    [name] PT 03/02/2017, 12:43 PM  [facility] [address] [place], [place], [address] Phone: [phone]   Fax:  [phone]  Name: [name] MRN: [id number] Date of Birth: 1941-02-08

## 2017-03-02 NOTE — Patient Instructions (Signed)
    Madelyn Flavors, PT 03/02/17 9:50 AM; [facility] [place], [place], [address] Phone: [phone]   Fax:  [phone]

## 2017-03-06 ENCOUNTER — Ambulatory Visit: Payer: PPO | Admitting: Physical Therapy

## 2017-03-06 DIAGNOSIS — M25562 Pain in left knee: Secondary | ICD-10-CM | POA: Diagnosis not present

## 2017-03-06 DIAGNOSIS — G8929 Other chronic pain: Secondary | ICD-10-CM

## 2017-03-06 NOTE — Therapy (Signed)
[facility] [place], [place], [address] Phone: [phone]   Fax:  [phone]  Physical Therapy Treatment  Patient Details  Name: [name] MRN: [id number] Date of Birth: 08-17-1940 Referring Provider: Betty [name] MD   Encounter Date: 03/06/2017  PT End of Session - 03/06/17 1434    Visit Number  3    Number of Visits  8    Date for PT Re-Evaluation  03/27/17    PT Start Time  1430    PT Stop Time  1518    PT Time Calculation (min)  48 min    Activity Tolerance  Patient tolerated treatment well    Behavior During Therapy  [facility] for tasks assessed/performed       Past Medical History:  Diagnosis Date  . Anxiety   . Arthritis   . Depression   . Essential hypertension   . Fibromyalgia   . Hyperlipemia   . PSVT (paroxysmal supraventricular tachycardia) ([place])   . Reflux   . Restless leg syndrome   . Type 2 diabetes mellitus ([place])     Past Surgical History:  Procedure Laterality Date  . ABDOMINAL HYSTERECTOMY    . ANKLE SURGERY    . BACK SURGERY     T12-L1 fusion, Anterior Cervical fusion  . CARDIAC CATHETERIZATION N/A 02/16/2015   Procedure: Left Heart Cath and Coronary Angiography;  Surgeon: Sherren Mocha, MD;  Location: [facility];  Service: Cardiovascular;  Laterality: N/A;  . CHOLECYSTECTOMY    . COLONOSCOPY    . ESOPHAGOGASTRODUODENOSCOPY  12/21/2010   Procedure: ESOPHAGOGASTRODUODENOSCOPY (EGD);  Surgeon: Landry Dyke, MD;  Location: [facility];  Service: Endoscopy;  Laterality: N/A;  . EXCISION MORTON'S NEUROMA    . FRACTURE SURGERY    . KNEE SURGERY     x 3  . NECK SURGERY    . ROTATOR CUFF REPAIR    . TONSILLECTOMY    . UPPER GASTROINTESTINAL ENDOSCOPY    . WRIST SURGERY      There were no vitals filed for this visit.  Subjective Assessment - 03/06/17 1434    Subjective  Patient states she was hurting so much all over today due to her fibromyalgia. She states that "whatever  you did to that muscle in my hip was wonderful. It felt better for 2 days and still feels better overall".    Pertinent History  Neck, back, right RTC, left wrist and left ankle surgery.  Athritis and DM.    Patient Stated Goals  I would like to be more mobile, walk better and travel.    Currently in Pain?  Yes    Pain Score  7     Pain Location  Knee    Pain Orientation  Left    Pain Descriptors / Indicators  Aching    Pain Type  Chronic pain    Pain Onset  More than a month ago    Pain Frequency  Constant                      [facility] Adult PT Treatment/Exercise - 03/06/17 0001      Knee/Hip Exercises: Stretches   Piriformis Stretch  Left;1 rep;20 seconds bent knee across body    Other Knee/Hip Stretches  butterfly on L x 30 sec      Knee/Hip Exercises: Aerobic   Nustep  L4 x 15 min goal of 80 steps/min      Knee/Hip Exercises: Seated  Long Arc [employer]  Strengthening;Both;20 reps for [employer] stretch      Knee/Hip Exercises: Supine   Bridges  Strengthening;Both;3 sets;10 reps    Straight Leg Raises  Strengthening;Left;1 set;10 reps    Other Supine Knee/Hip Exercises  marching left hip 1x10, 1x20 with abdominal contraction      Knee/Hip Exercises: Sidelying   Clams  Left 1x20      Manual Therapy   Manual Therapy  Myofascial release    Myofascial Release  TPR to QL and glut med/min             PT Education - 03/06/17 1528    Education provided  Yes    Education Details  hep reissued as patient lost h/o    Person(s) Educated  Patient    Methods  Explanation;Demonstration;Handout;Verbal cues    Comprehension  Verbalized understanding;Returned demonstration       PT Short Term Goals - 02/27/17 0947      PT SHORT TERM GOAL #1   Title  Independent with an initial HEP.    Time  2    Period  Weeks    Status  New        PT Long Term Goals - 02/27/17 0947      PT LONG TERM GOAL #1   Title  Independent with an advanced HEP.    Time  4    Status  New             Plan - 03/06/17 1530    Clinical Impression Statement  Patient continues to do well with TE. HEP was re-issued as pt lost h/o due to moving. LTG is ongoing.    Rehab Potential  Good    PT Frequency  2x / week    PT Duration  4 weeks    PT Treatment/Interventions  Therapeutic activities;Therapeutic exercise;Gait training;Patient/family education    PT Next Visit Plan  Continue Bil hip/knee strengthening; stretching as indicated.    PT Home Exercise Plan  supine marching, bridge, SDLY clam, QL stretch in supine    Consulted and Agree with Plan of Care  Patient       Patient will benefit from skilled therapeutic intervention in order to improve the following deficits and impairments:  Abnormal gait, Decreased activity tolerance, Decreased mobility, Decreased strength, Decreased balance, Pain, Postural dysfunction  Visit Diagnosis: Chronic pain of left knee     Problem List Patient Active Problem List   Diagnosis Date Noted  . Unstable gait 01/12/2017  . Generalized osteoarthritis of hand 01/12/2017  . RLS (restless legs syndrome) 11/09/2016  . GERD (gastroesophageal reflux disease) 07/13/2016  . Class 2 severe obesity due to excess calories with serious comorbidity and body mass index (BMI) of 36.0 to 36.9 in adult ([place]) 06/01/2016  . Vitamin D deficiency 06/01/2016  . Vitamin B12 deficiency 03/27/2016  . Chronic cough 03/16/2016  . Left foot pain 03/16/2016  . Demand ischemia ([place]) 02/14/2015  . Degenerative drusen 07/13/2013  . Type 2 diabetes mellitus without ophthalmic manifestations ([place]) 07/13/2013  . DM (diabetes mellitus) type II controlled, neurological manifestation ([place]) 05/07/2012  . Paroxysmal atrial tachycardia ([place]) 05/07/2012  . Essential hypertension, benign 05/07/2012  . Depression 05/07/2012  . Hyperlipidemia 05/07/2012  . Fibromyalgia 05/07/2012    Madelyn Flavors PT 03/06/2017, 3:32 PM  [facility] [address] [place], [place], [address] Phone: [phone]   Fax:  [phone]  Name: [name] MRN: [id number] Date of Birth: [birth year]

## 2017-03-08 NOTE — Progress Notes (Signed)
Please place orders in Epic as patient is being scheduled for a pre-op appointment! Thank you! 

## 2017-03-09 ENCOUNTER — Encounter: Payer: PPO | Admitting: Physical Therapy

## 2017-03-09 DIAGNOSIS — I10 Essential (primary) hypertension: Secondary | ICD-10-CM | POA: Diagnosis not present

## 2017-03-09 DIAGNOSIS — E7849 Other hyperlipidemia: Secondary | ICD-10-CM | POA: Diagnosis not present

## 2017-03-09 DIAGNOSIS — Z01810 Encounter for preprocedural cardiovascular examination: Secondary | ICD-10-CM | POA: Diagnosis not present

## 2017-03-09 DIAGNOSIS — I2510 Atherosclerotic heart disease of native coronary artery without angina pectoris: Secondary | ICD-10-CM | POA: Diagnosis not present

## 2017-03-09 DIAGNOSIS — E668 Other obesity: Secondary | ICD-10-CM | POA: Diagnosis not present

## 2017-03-09 DIAGNOSIS — E1140 Type 2 diabetes mellitus with diabetic neuropathy, unspecified: Secondary | ICD-10-CM | POA: Diagnosis not present

## 2017-03-09 DIAGNOSIS — I471 Supraventricular tachycardia: Secondary | ICD-10-CM | POA: Diagnosis not present

## 2017-03-12 ENCOUNTER — Ambulatory Visit: Payer: PPO | Admitting: Physical Therapy

## 2017-03-12 DIAGNOSIS — M25562 Pain in left knee: Secondary | ICD-10-CM | POA: Diagnosis not present

## 2017-03-12 DIAGNOSIS — G8929 Other chronic pain: Secondary | ICD-10-CM

## 2017-03-12 NOTE — Therapy (Signed)
[facility] [place], [place], [address] Phone: [phone]   Fax:  [phone]  Physical Therapy Treatment  Patient Details  Name: [name] MRN: [id number] Date of Birth: 06-14-1940 Referring Provider: Betty [name] MD   Encounter Date: 03/12/2017  PT End of Session - 03/12/17 1150    Visit Number  4    Number of Visits  8    Date for PT Re-Evaluation  03/27/17    PT Start Time  1138    PT Stop Time  1207    PT Time Calculation (min)  29 min    Activity Tolerance  Patient tolerated treatment well    Behavior During Therapy  [facility] for tasks assessed/performed       Past Medical History:  Diagnosis Date  . Anxiety   . Arthritis   . Depression   . Essential hypertension   . Fibromyalgia   . Hyperlipemia   . PSVT (paroxysmal supraventricular tachycardia) ([place])   . Reflux   . Restless leg syndrome   . Type 2 diabetes mellitus ([place])     Past Surgical History:  Procedure Laterality Date  . ABDOMINAL HYSTERECTOMY    . ANKLE SURGERY    . BACK SURGERY     T12-L1 fusion, Anterior Cervical fusion  . CARDIAC CATHETERIZATION N/A 02/16/2015   Procedure: Left Heart Cath and Coronary Angiography;  Surgeon: Sherren Mocha, MD;  Location: [facility];  Service: Cardiovascular;  Laterality: N/A;  . CHOLECYSTECTOMY    . COLONOSCOPY    . ESOPHAGOGASTRODUODENOSCOPY  12/21/2010   Procedure: ESOPHAGOGASTRODUODENOSCOPY (EGD);  Surgeon: Landry Dyke, MD;  Location: [facility];  Service: Endoscopy;  Laterality: N/A;  . EXCISION MORTON'S NEUROMA    . FRACTURE SURGERY    . KNEE SURGERY     x 3  . NECK SURGERY    . ROTATOR CUFF REPAIR    . TONSILLECTOMY    . UPPER GASTROINTESTINAL ENDOSCOPY    . WRIST SURGERY      There were no vitals filed for this visit.  Subjective Assessment - 03/12/17 1150    Subjective  Patient states her hip was hurting so bad on Saturday that it made her cry. She put some OTC heat pads on  them and they gave her relief after 3 hours.     Pertinent History  Neck, back, right RTC, left wrist and left ankle surgery.  Athritis and DM.    Patient Stated Goals  I would like to be more mobile, walk better and travel.    Currently in Pain?  Yes    Pain Score  3     Pain Location  Knee    Pain Orientation  Left    Pain Descriptors / Indicators  Aching    Pain Radiating Towards  L hip 5/10    Pain Onset  More than a month ago    Pain Frequency  Constant                      [facility] Adult PT Treatment/Exercise - 03/12/17 0001      Knee/Hip Exercises: Aerobic   Nustep  L5 x 2 min, L 4 x 13 min 70 steps per min.      Knee/Hip Exercises: Seated   Sit to Sand  5 reps      Knee/Hip Exercises: Supine   Bridges  Strengthening;Both;1 set;5 reps pt c/o L hip and low back pain  Straight Leg Raises  Strengthening;Left;2 sets;10 reps      Knee/Hip Exercises: Sidelying   Hip ABduction  Strengthening;Left;2 sets;5 sets;1 set;10 reps 2x5 sets then 1 x 10    Clams  Left 1x20               PT Short Term Goals - 02/27/17 0947      PT SHORT TERM GOAL #1   Title  Independent with an initial HEP.    Time  2    Period  Weeks    Status  New        PT Long Term Goals - 02/27/17 0947      PT LONG TERM GOAL #1   Title  Independent with an advanced HEP.    Time  4    Status  New            Plan - 03/12/17 1209    Clinical Impression Statement  Patient was 23 min late for appt. She had pain with bridging today so we held it and did sit to stands.    PT Treatment/Interventions  Therapeutic activities;Therapeutic exercise;Gait training;Patient/family education    PT Next Visit Plan  Continue Bil hip/knee strengthening; stretching as indicated.    PT Home Exercise Plan  supine marching, bridge, SDLY clam, QL stretch in supine, sit to stand (hold bridge if painful)       Patient will benefit from skilled therapeutic intervention in order to improve the  following deficits and impairments:  Abnormal gait, Decreased activity tolerance, Decreased mobility, Decreased strength, Decreased balance, Pain, Postural dysfunction  Visit Diagnosis: Chronic pain of left knee     Problem List Patient Active Problem List   Diagnosis Date Noted  . Unstable gait 01/12/2017  . Generalized osteoarthritis of hand 01/12/2017  . RLS (restless legs syndrome) 11/09/2016  . GERD (gastroesophageal reflux disease) 07/13/2016  . Class 2 severe obesity due to excess calories with serious comorbidity and body mass index (BMI) of 36.0 to 36.9 in adult ([place]) 06/01/2016  . Vitamin D deficiency 06/01/2016  . Vitamin B12 deficiency 03/27/2016  . Chronic cough 03/16/2016  . Left foot pain 03/16/2016  . Demand ischemia ([place]) 02/14/2015  . Degenerative drusen 07/13/2013  . Type 2 diabetes mellitus without ophthalmic manifestations ([place]) 07/13/2013  . DM (diabetes mellitus) type II controlled, neurological manifestation ([place]) 05/07/2012  . Paroxysmal atrial tachycardia ([place]) 05/07/2012  . Essential hypertension, benign 05/07/2012  . Depression 05/07/2012  . Hyperlipidemia 05/07/2012  . Fibromyalgia 05/07/2012    Madelyn Flavors PT 03/12/2017, 12:13 PM  [facility] [address] [place], [place], [address] Phone: [phone]   Fax:  [phone]  Name: [name] MRN: [id number] Date of Birth: [birth year]

## 2017-03-15 ENCOUNTER — Encounter: Payer: Self-pay | Admitting: Physical Therapy

## 2017-03-15 ENCOUNTER — Ambulatory Visit: Payer: PPO | Admitting: Physical Therapy

## 2017-03-15 DIAGNOSIS — G8929 Other chronic pain: Secondary | ICD-10-CM

## 2017-03-15 DIAGNOSIS — M25562 Pain in left knee: Principal | ICD-10-CM

## 2017-03-15 DIAGNOSIS — R2681 Unsteadiness on feet: Secondary | ICD-10-CM

## 2017-03-15 NOTE — Therapy (Signed)
[facility] [place], [place], [address] Phone: [phone]   Fax:  [phone]  Physical Therapy Treatment  Patient Details  Name: [name] MRN: [id number] Date of Birth: 20-Jul-1940 Referring Provider: Betty [name] MD   Encounter Date: 03/15/2017  PT End of Session - 03/15/17 1149    Visit Number  5    Number of Visits  8    Date for PT Re-Evaluation  03/27/17    PT Start Time  1133 82mn late    PT Stop Time  1200    PT Time Calculation (min)  27 min    Activity Tolerance  Patient tolerated treatment well    Behavior During Therapy  [facility]       Past Medical History:  Diagnosis Date  . Anxiety   . Arthritis   . Depression   . Essential hypertension   . Fibromyalgia   . Hyperlipemia   . PSVT (paroxysmal [place]ventricular tachycardia) ([place]   . Reflux   . Restless leg syndrome   . Type 2 diabetes mellitus ([place]     Past Surgical History:  Procedure Laterality Date  . ABDOMINAL HYSTERECTOMY    . ANKLE SURGERY    . BACK SURGERY     T12-L1 fusion, Anterior Cervical fusion  . CARDIAC CATHETERIZATION N/A 02/16/2015   Procedure: Left Heart Cath and Coronary Angiography;  Surgeon: MSherren Mocha MD;  [facility];  Service: Cardiovascular;  Laterality: N/A;  . CHOLECYSTECTOMY    . COLONOSCOPY    . ESOPHAGOGASTRODUODENOSCOPY  12/21/2010   Procedure: ESOPHAGOGASTRODUODENOSCOPY (EGD);  Surgeon: WLandry Dyke MD;  [facility];  Service: Endoscopy;  Laterality: N/A;  . EXCISION MORTON'S NEUROMA    . FRACTURE SURGERY    . KNEE SURGERY     x 3  . NECK SURGERY    . ROTATOR CUFF REPAIR    . TONSILLECTOMY    . UPPER GASTROINTESTINAL ENDOSCOPY    . WRIST SURGERY      There were no vitals filed for this visit.  Subjective Assessment - 03/15/17 1135    Subjective  Patient reported less pain upon arrival and no assistive device    Pertinent History  Neck,  back, right RTC, left wrist and left ankle surgery.  Athritis and DM.    Patient Stated Goals  I would like to be more mobile, walk better and travel.    Currently in Pain?  Yes    Pain Score  2     Pain Location  Knee    Pain Orientation  Left    Pain Descriptors / Indicators  Aching    Pain Type  Chronic pain    Pain Onset  More than a month ago    Pain Frequency  Constant    Aggravating Factors   unsure    Pain Relieving Factors  at rest                      [facility]      Knee/Hip Exercises: Aerobic   Nustep  L4 x457m UE/LE monitored      Knee/Hip Exercises: Standing   Heel Raises  Both;2 sets;10 reps      Knee/Hip Exercises: Seated   Long Arc Quad  Strengthening;Left;10 reps;Weights;2 sets    Long Arc Quad Weight  2 lbs.    Other Seated Knee/Hip Exercises  HS curl with red  t-band x20 LT LE    Sit to Sand  10 reps;with UE support mid to high mat table      Knee/Hip Exercises: Supine   Straight Leg Raises  Strengthening;Left;2 sets;20 reps;Limitations    Straight Leg Raises Limitations  rest after 6-8 each set    Other Supine Knee/Hip Exercises  hip abd with red t-band x30    Other Supine Knee/Hip Exercises  ball squeeze x30      Knee/Hip Exercises: Sidelying   Clams  x10 LT LE               PT Short Term Goals - 03/15/17 1150      PT SHORT TERM GOAL #1   Title  Independent with an initial HEP.    Time  2    Period  Weeks    Status  Achieved        PT Long Term Goals - 02/27/17 0947      PT LONG TERM GOAL #1   Title  Independent with an advanced HEP.    Time  4    Status  New            Plan - 03/15/17 1158    Clinical Impression Statement  Patient was late to appt today and only able to perform exercises accordingly. Patient reported feeling the best she has felt in a long time. Patient tolerated treatment well today and reported no pain. Patient has reported doing HEP as directed with  no difficulty. Met HEP goal. others ongoing.     Rehab Potential  Good    PT Frequency  2x / week    PT Duration  4 weeks    PT Treatment/Interventions  Therapeutic activities;Therapeutic exercise;Gait training;Patient/family education    PT Next Visit Plan  Continue Bil hip/knee strengthening; stretching as indicated.    Consulted and Agree with Plan of Care  Patient       Patient will benefit from skilled therapeutic intervention in order to improve the following deficits and impairments:  Abnormal gait, Decreased activity tolerance, Decreased mobility, Decreased strength, Decreased balance, Pain, Postural dysfunction  Visit Diagnosis: Chronic pain of left knee  Unsteadiness on feet     Problem List Patient Active Problem List   Diagnosis Date Noted  . Unstable gait 01/12/2017  . Generalized osteoarthritis of hand 01/12/2017  . RLS (restless legs syndrome) 11/09/2016  . GERD (gastroesophageal reflux disease) 07/13/2016  . Class 2 severe obesity due to excess calories with serious comorbidity and body mass index (BMI) of 36.0 to 36.9 in adult ([place]) 06/01/2016  . Vitamin D deficiency 06/01/2016  . Vitamin B12 deficiency 03/27/2016  . Chronic cough 03/16/2016  . Left foot pain 03/16/2016  . Demand ischemia ([place]) 02/14/2015  . Degenerative drusen 07/13/2013  . Type 2 diabetes mellitus without ophthalmic manifestations ([place]) 07/13/2013  . DM (diabetes mellitus) type II controlled, neurological manifestation ([place]) 05/07/2012  . Paroxysmal atrial tachycardia ([place]) 05/07/2012  . Essential hypertension, benign 05/07/2012  . Depression 05/07/2012  . Hyperlipidemia 05/07/2012  . Fibromyalgia 05/07/2012    Phillips Climes, PTA 03/15/2017, 12:04 PM  [facility] [place], [place], [address] Phone: [phone]   Fax:  [phone]  Name: [name] MRN: [id number] Date of Birth: 1940-08-27

## 2017-03-20 ENCOUNTER — Ambulatory Visit: Payer: PPO | Attending: Orthopedic Surgery | Admitting: *Deleted

## 2017-03-20 DIAGNOSIS — M25562 Pain in left knee: Secondary | ICD-10-CM | POA: Insufficient documentation

## 2017-03-20 DIAGNOSIS — M6281 Muscle weakness (generalized): Secondary | ICD-10-CM

## 2017-03-20 DIAGNOSIS — R2681 Unsteadiness on feet: Secondary | ICD-10-CM | POA: Diagnosis not present

## 2017-03-20 DIAGNOSIS — G8929 Other chronic pain: Secondary | ICD-10-CM | POA: Diagnosis not present

## 2017-03-20 NOTE — Therapy (Signed)
[facility] [place], [place], [address] Phone: [phone]   Fax:  [phone]  Physical Therapy Treatment  Patient Details  Name: [name] MRN: [id number] Date of Birth: 22-Mar-1940 Referring Provider: Betty [name] MD   Encounter Date: 03/20/2017  PT End of Session - 03/20/17 1114    Visit Number  6    Number of Visits  8    Date for PT Re-Evaluation  03/27/17    PT Start Time  1115    PT Stop Time  1205    PT Time Calculation (min)  50 min       Past Medical History:  Diagnosis Date  . Anxiety   . Arthritis   . Depression   . Essential hypertension   . Fibromyalgia   . Hyperlipemia   . PSVT (paroxysmal supraventricular tachycardia) ([place])   . Reflux   . Restless leg syndrome   . Type 2 diabetes mellitus ([place])     Past Surgical History:  Procedure Laterality Date  . ABDOMINAL HYSTERECTOMY    . ANKLE SURGERY    . BACK SURGERY     T12-L1 fusion, Anterior Cervical fusion  . CARDIAC CATHETERIZATION N/A 02/16/2015   Procedure: Left Heart Cath and Coronary Angiography;  Surgeon: Sherren Mocha, MD;  Location: [facility];  Service: Cardiovascular;  Laterality: N/A;  . CHOLECYSTECTOMY    . COLONOSCOPY    . ESOPHAGOGASTRODUODENOSCOPY  12/21/2010   Procedure: ESOPHAGOGASTRODUODENOSCOPY (EGD);  Surgeon: Landry Dyke, MD;  Location: [facility];  Service: Endoscopy;  Laterality: N/A;  . EXCISION MORTON'S NEUROMA    . FRACTURE SURGERY    . KNEE SURGERY     x 3  . NECK SURGERY    . ROTATOR CUFF REPAIR    . TONSILLECTOMY    . UPPER GASTROINTESTINAL ENDOSCOPY    . WRIST SURGERY      There were no vitals filed for this visit.  Subjective Assessment - 03/20/17 1114    Pertinent History  Neck, back, right RTC, left wrist and left ankle surgery.  Athritis and DM.    Patient Stated Goals  I would like to be more mobile, walk better and travel.    Currently in Pain?  Yes    Pain Score  2     Pain  Location  Knee    Pain Orientation  Left    Pain Descriptors / Indicators  Aching    Pain Onset  More than a month ago    Pain Frequency  Constant                      [facility] Adult PT Treatment/Exercise - 03/20/17 0001      Knee/Hip Exercises: Aerobic   Nustep  L4 x   15 min UE/LE monitored      Knee/Hip Exercises: Standing   Heel Raises  --    Rocker Board  5 minutes      Knee/Hip Exercises: Seated   Long Arc Quad  Strengthening;Left;10 reps;Weights;2 sets    Long Arc Quad Weight  3 lbs.    Other Seated Knee/Hip Exercises  HS curl with red t-band x20 LT LE    Sit to Sand  10 reps;with UE support mid to high mat table      Knee/Hip Exercises: Supine   Straight Leg Raises  Strengthening;Left;2 sets;20 reps;Limitations    Straight Leg Raises Limitations  rest after 6-8 each set  Other Supine Knee/Hip Exercises  hip abd with red t-band x30               PT Short Term Goals - 03/15/17 1150      PT SHORT TERM GOAL #1   Title  Independent with an initial HEP.    Time  2    Period  Weeks    Status  Achieved        PT Long Term Goals - 02/27/17 0947      PT LONG TERM GOAL #1   Title  Independent with an advanced HEP.    Time  4    Status  New            Plan - 03/20/17 1258    Clinical Impression Statement  Pt arrived today doing better and tolerated LT LE Therex easier. She was able to complete all sitting and atanding exs with mainly fatigue in LT LE. She will continue PT until surgery in 2 weeks.    Clinical Presentation  Evolving    Rehab Potential  Good    Clinical Impairments Affecting Rehab Potential  [place].    PT Frequency  2x / week    PT Duration  4 weeks    PT Treatment/Interventions  Therapeutic activities;Therapeutic exercise;Gait training;Patient/family education    PT Next Visit Plan  Continue Bil hip/knee strengthening; stretching as indicated.    PT Home Exercise Plan  supine marching, bridge, SDLY clam, QL stretch in  supine, sit to stand (hold bridge if painful)    Consulted and Agree with Plan of Care  Patient       Patient will benefit from skilled therapeutic intervention in order to improve the following deficits and impairments:  Abnormal gait, Decreased activity tolerance, Decreased mobility, Decreased strength, Decreased balance, Pain, Postural dysfunction  Visit Diagnosis: Chronic pain of left knee  Unsteadiness on feet  Muscle weakness (generalized)     Problem List Patient Active Problem List   Diagnosis Date Noted  . Unstable gait 01/12/2017  . Generalized osteoarthritis of hand 01/12/2017  . RLS (restless legs syndrome) 11/09/2016  . GERD (gastroesophageal reflux disease) 07/13/2016  . Class 2 severe obesity due to excess calories with serious comorbidity and body mass index (BMI) of 36.0 to 36.9 in adult ([place]) 06/01/2016  . Vitamin D deficiency 06/01/2016  . Vitamin B12 deficiency 03/27/2016  . Chronic cough 03/16/2016  . Left foot pain 03/16/2016  . Demand ischemia ([place]) 02/14/2015  . Degenerative drusen 07/13/2013  . Type 2 diabetes mellitus without ophthalmic manifestations ([place]) 07/13/2013  . DM (diabetes mellitus) type II controlled, neurological manifestation ([place]) 05/07/2012  . Paroxysmal atrial tachycardia ([place]) 05/07/2012  . Essential hypertension, benign 05/07/2012  . Depression 05/07/2012  . Hyperlipidemia 05/07/2012  . Fibromyalgia 05/07/2012    [name],CHRIS, PTA 03/20/2017, 1:54 PM  [facility] [place], [place], [address] Phone: [phone]   Fax:  [phone]  Name: [name] MRN: [id number] Date of Birth: 07-03-1940

## 2017-03-23 ENCOUNTER — Ambulatory Visit: Payer: PPO | Admitting: *Deleted

## 2017-03-23 DIAGNOSIS — R2681 Unsteadiness on feet: Secondary | ICD-10-CM

## 2017-03-23 DIAGNOSIS — M25562 Pain in left knee: Principal | ICD-10-CM

## 2017-03-23 DIAGNOSIS — M6281 Muscle weakness (generalized): Secondary | ICD-10-CM

## 2017-03-23 DIAGNOSIS — G8929 Other chronic pain: Secondary | ICD-10-CM

## 2017-03-23 NOTE — Therapy (Signed)
[facility] [place], [place], [address] Phone: [phone]   Fax:  [phone]  Physical Therapy Treatment  Patient Details  Name: [name] MRN: [id number] Date of Birth: [birth year] Referring Provider: Betty [name] MD   Encounter Date: 03/23/2017  PT End of Session - 03/23/17 1130    Visit Number  7    Number of Visits  8    Date for PT Re-Evaluation  03/27/17    Authorization Time Period  To MD 03-29-17    PT Start Time  1115    PT Stop Time  1205    PT Time Calculation (min)  50 min       Past Medical History:  Diagnosis Date  . Anxiety   . Arthritis   . Depression   . Essential hypertension   . Fibromyalgia   . Hyperlipemia   . PSVT (paroxysmal supraventricular tachycardia) ([place])   . Reflux   . Restless leg syndrome   . Type 2 diabetes mellitus ([place])     Past Surgical History:  Procedure Laterality Date  . ABDOMINAL HYSTERECTOMY    . ANKLE SURGERY    . BACK SURGERY     T12-L1 fusion, Anterior Cervical fusion  . CARDIAC CATHETERIZATION N/A 02/16/2015   Procedure: Left Heart Cath and Coronary Angiography;  Surgeon: Sherren Mocha, MD;  Location: [facility];  Service: Cardiovascular;  Laterality: N/A;  . CHOLECYSTECTOMY    . COLONOSCOPY    . ESOPHAGOGASTRODUODENOSCOPY  12/21/2010   Procedure: ESOPHAGOGASTRODUODENOSCOPY (EGD);  Surgeon: Landry Dyke, MD;  Location: [facility];  Service: Endoscopy;  Laterality: N/A;  . EXCISION MORTON'S NEUROMA    . FRACTURE SURGERY    . KNEE SURGERY     x 3  . NECK SURGERY    . ROTATOR CUFF REPAIR    . TONSILLECTOMY    . UPPER GASTROINTESTINAL ENDOSCOPY    . WRIST SURGERY      There were no vitals filed for this visit.  Subjective Assessment - 03/23/17 1129    Subjective  Sore all over today from Fibro    Pertinent History  Neck, back, right RTC, left wrist and left ankle surgery.  Athritis and DM.    Patient Stated Goals  I would like to be more  mobile, walk better and travel.    Currently in Pain?  Yes    Pain Score  3     Pain Location  Knee    Pain Orientation  Left    Pain Descriptors / Indicators  Aching    Pain Type  Chronic pain    Pain Onset  More than a month ago    Pain Frequency  Constant                      [facility] Adult PT Treatment/Exercise - 03/23/17 0001      Knee/Hip Exercises: Aerobic   Nustep  L4 x   15 min UE/LE monitored      Knee/Hip Exercises: Standing   Rocker Board  5 minutes    Other Standing Knee Exercises  Romberg test (-)      Knee/Hip Exercises: Seated   Long Arc Quad  Strengthening;Left;10 reps;Weights;3 sets    Long Arc Quad Weight  3 lbs.    Other Seated Knee/Hip Exercises  HS curl with red t-band x  30 LT LE    Sit to Sand  10 reps;2 sets;without UE support mid to  high mat table               PT Short Term Goals - 03/15/17 1150      PT SHORT TERM GOAL #1   Title  Independent with an initial HEP.    Time  2    Period  Weeks    Status  Achieved        PT Long Term Goals - 03/23/17 1201      PT LONG TERM GOAL #1   Title  Independent with an advanced HEP.    Time  4    Period  Weeks    Status  On-going      PT LONG TERM GOAL #2   Title  Improve Berg score to 49-50/56.    Time  8    Period  Weeks    Status  On-going      PT LONG TERM GOAL #3   Title  Negative Romberg test.    Baseline  MET 03-23-17    Status  Achieved      PT LONG TERM GOAL #4   Title  Improve bilateral hip strength to 5/5 to increase stability for walking and functional activites.    Period  Weeks    Status  On-going            Plan - 03/23/17 1208    Clinical Impression Statement  Pt arrived today doing a little better today with higher energy levels. She was able to perform sitting and standing exs for LT LE strengthening. She also did better with balance and was able to meet LTG for Romberg    Clinical Presentation  Evolving    Rehab Potential  Good    Clinical  Impairments Affecting Rehab Potential  [place].    PT Frequency  2x / week    PT Duration  4 weeks    PT Treatment/Interventions  Therapeutic activities;Therapeutic exercise;Gait training;Patient/family education    PT Next Visit Plan  Continue Bil hip/knee strengthening; stretching as indicated.    PT Home Exercise Plan  supine marching, bridge, SDLY clam, QL stretch in supine, sit to stand (hold bridge if painful)      1 visit left.. More visits?    Consulted and Agree with Plan of Care  Patient       Patient will benefit from skilled therapeutic intervention in order to improve the following deficits and impairments:  Abnormal gait, Decreased activity tolerance, Decreased mobility, Decreased strength, Decreased balance, Pain, Postural dysfunction  Visit Diagnosis: Chronic pain of left knee  Unsteadiness on feet  Muscle weakness (generalized)     Problem List Patient Active Problem List   Diagnosis Date Noted  . Unstable gait 01/12/2017  . Generalized osteoarthritis of hand 01/12/2017  . RLS (restless legs syndrome) 11/09/2016  . GERD (gastroesophageal reflux disease) 07/13/2016  . Class 2 severe obesity due to excess calories with serious comorbidity and body mass index (BMI) of 36.0 to 36.9 in adult ([place]) 06/01/2016  . Vitamin D deficiency 06/01/2016  . Vitamin B12 deficiency 03/27/2016  . Chronic cough 03/16/2016  . Left foot pain 03/16/2016  . Demand ischemia ([place]) 02/14/2015  . Degenerative drusen 07/13/2013  . Type 2 diabetes mellitus without ophthalmic manifestations ([place]) 07/13/2013  . DM (diabetes mellitus) type II controlled, neurological manifestation ([place]) 05/07/2012  . Paroxysmal atrial tachycardia ([place]) 05/07/2012  . Essential hypertension, benign 05/07/2012  . Depression 05/07/2012  . Hyperlipidemia 05/07/2012  . Fibromyalgia 05/07/2012    Ruthie Berch,CHRIS,  PTA 03/23/2017, 12:16 PM  [facility] [address] [place], [place], [address] Phone: [phone]   Fax:  [phone]  Name: GORDIE BELVIN MRN: [id number] Date of Birth: 28-Apr-1940

## 2017-03-26 ENCOUNTER — Ambulatory Visit: Payer: PPO | Admitting: Physical Therapy

## 2017-03-26 ENCOUNTER — Encounter: Payer: Self-pay | Admitting: Physical Therapy

## 2017-03-26 DIAGNOSIS — M6281 Muscle weakness (generalized): Secondary | ICD-10-CM

## 2017-03-26 DIAGNOSIS — G8929 Other chronic pain: Secondary | ICD-10-CM

## 2017-03-26 DIAGNOSIS — M25562 Pain in left knee: Secondary | ICD-10-CM | POA: Diagnosis not present

## 2017-03-26 DIAGNOSIS — R2681 Unsteadiness on feet: Secondary | ICD-10-CM

## 2017-03-26 NOTE — Therapy (Addendum)
[facility] [place], [place], [address] Phone: [phone]   Fax:  [phone]  Physical Therapy Treatment  Patient Details  Name: [name] MRN: [id number] Date of Birth: 17-Mar-1940 Referring Provider: Betty [name] MD   Encounter Date: 03/26/2017  PT End of Session - 03/26/17 1359    Visit Number  8    Number of Visits  8    Date for PT Re-Evaluation  03/27/17    Authorization Time Period  To MD 03-29-17    PT Start Time  2297    PT Stop Time  1427    PT Time Calculation (min)  35 min    Activity Tolerance  Patient tolerated treatment well;Patient limited by fatigue    Behavior During Therapy  [facility] for tasks assessed/performed       Past Medical History:  Diagnosis Date  . Anxiety   . Arthritis   . Depression   . Essential hypertension   . Fibromyalgia   . Hyperlipemia   . PSVT (paroxysmal supraventricular tachycardia) ([place])   . Reflux   . Restless leg syndrome   . Type 2 diabetes mellitus ([place])     Past Surgical History:  Procedure Laterality Date  . ABDOMINAL HYSTERECTOMY    . ANKLE SURGERY    . BACK SURGERY     T12-L1 fusion, Anterior Cervical fusion  . CARDIAC CATHETERIZATION N/A 02/16/2015   Procedure: Left Heart Cath and Coronary Angiography;  Surgeon: Sherren Mocha, MD;  Location: [facility];  Service: Cardiovascular;  Laterality: N/A;  . CHOLECYSTECTOMY    . COLONOSCOPY    . ESOPHAGOGASTRODUODENOSCOPY  12/21/2010   Procedure: ESOPHAGOGASTRODUODENOSCOPY (EGD);  Surgeon: Landry Dyke, MD;  Location: [facility];  Service: Endoscopy;  Laterality: N/A;  . EXCISION MORTON'S NEUROMA    . FRACTURE SURGERY    . KNEE SURGERY     x 3  . NECK SURGERY    . ROTATOR CUFF REPAIR    . TONSILLECTOMY    . UPPER GASTROINTESTINAL ENDOSCOPY    . WRIST SURGERY      There were no vitals filed for this visit.  Subjective Assessment - 03/26/17 1354    Subjective  Patient reported little activity  today so little discomfort upon arrival    Pertinent History  Neck, back, right RTC, left wrist and left ankle surgery.  Athritis and DM.    Patient Stated Goals  I would like to be more mobile, walk better and travel.    Currently in Pain?  Yes    Pain Score  2     Pain Location  Knee    Pain Orientation  Left    Pain Descriptors / Indicators  Discomfort    Pain Type  Chronic pain    Pain Onset  More than a month ago    Pain Frequency  Constant    Aggravating Factors   increased activity    Pain Relieving Factors  at rest                      [facility] Adult PT Treatment/Exercise - 03/26/17 0001      Knee/Hip Exercises: Aerobic   Nustep  L4 x   15 min UE/LE monitored      Knee/Hip Exercises: Seated   Long Arc Quad  Strengthening;10 reps;Weights;3 sets;Both    Long Arc Quad Weight  4 lbs.    Other Seated Knee/Hip Exercises  HS curl bil with  blue t-band x  30 LT LE    Sit to Sand  10 reps;without UE support;1 set      Knee/Hip Exercises: Supine   Other Supine Knee/Hip Exercises  hip abd with blue t-band x30               PT Short Term Goals - 03/15/17 1150      PT SHORT TERM GOAL #1   Title  Independent with an initial HEP.    Time  2    Period  Weeks    Status  Achieved        PT Long Term Goals - 03/23/17 1201      PT LONG TERM GOAL #1   Title  Independent with an advanced HEP.    Time  4    Period  Weeks    Status  On-going      PT LONG TERM GOAL #2   Title  Improve Berg score to 49-50/56.    Time  8    Period  Weeks    Status  On-going      PT LONG TERM GOAL #3   Title  Negative Romberg test.    Baseline  MET 03-23-17    Status  Achieved      PT LONG TERM GOAL #4   Title  Improve bilateral hip strength to 5/5 to increase stability for walking and functional activites.    Period  Weeks    Status  On-going            Plan - 03/26/17 1427    Clinical Impression Statement  Patient tolerated treatment fair today. Patient did  well tostart with yet eneded treatment early due to fatigue. Patient is going to join a gym program to continue to prepare for surgury. Patient has good and bad days of discomfort in knee due to the level of activity. Patient going to MD for F/U appt. No further goals met.     Rehab Potential  Good    PT Frequency  2x / week    PT Duration  4 weeks    PT Treatment/Interventions  Therapeutic activities;Therapeutic exercise;Gait training;Patient/family education    PT Next Visit Plan  Continue Bil hip/knee strengthening; stretching as indicated. (MD note sent to Dr. Alvan Dame)    Consulted and Agree with Plan of Care  Patient       Patient will benefit from skilled therapeutic intervention in order to improve the following deficits and impairments:  Abnormal gait, Decreased activity tolerance, Decreased mobility, Decreased strength, Decreased balance, Pain, Postural dysfunction  Visit Diagnosis: Chronic pain of left knee  Unsteadiness on feet  Muscle weakness (generalized)     Problem List Patient Active Problem List   Diagnosis Date Noted  . Unstable gait 01/12/2017  . Generalized osteoarthritis of hand 01/12/2017  . RLS (restless legs syndrome) 11/09/2016  . GERD (gastroesophageal reflux disease) 07/13/2016  . Class 2 severe obesity due to excess calories with serious comorbidity and body mass index (BMI) of 36.0 to 36.9 in adult ([place]) 06/01/2016  . Vitamin D deficiency 06/01/2016  . Vitamin B12 deficiency 03/27/2016  . Chronic cough 03/16/2016  . Left foot pain 03/16/2016  . Demand ischemia ([place]) 02/14/2015  . Degenerative drusen 07/13/2013  . Type 2 diabetes mellitus without ophthalmic manifestations ([place]) 07/13/2013  . DM (diabetes mellitus) type II controlled, neurological manifestation ([place]) 05/07/2012  . Paroxysmal atrial tachycardia ([place]) 05/07/2012  . Essential hypertension, benign 05/07/2012  . Depression 05/07/2012  .  Hyperlipidemia 05/07/2012  . Fibromyalgia 05/07/2012    Ladean Raya, PTA 03/26/17 2:31 PM  [facility] [place], [place], [address] Phone: [phone]   Fax:  [phone]  Name: TEXAS OBORN MRN: [id number] Date of Birth: Dec 12, 1940  PHYSICAL THERAPY DISCHARGE SUMMARY  Visits from Start of Care: 8.  Current functional level related to goals / functional outcomes: See above.   Remaining deficits: See goal section.   Education / Equipment: HEP. Plan: Patient agrees to discharge.  Patient goals were partially met. Patient is being discharged due to                                                     ?????         Mali Applegate MPT

## 2017-03-27 ENCOUNTER — Telehealth: Payer: Self-pay | Admitting: Family Medicine

## 2017-03-27 NOTE — Telephone Encounter (Signed)
Patient's daughter is calling to report that her mother is doing physical therapy and she is having some discomfort and soreness. She can not take NSAID due to other medications she uses- what are her options- is there something that can be called in for her to use when she really needs it that is not a narcotic.

## 2017-03-28 ENCOUNTER — Encounter: Payer: PPO | Admitting: Physical Therapy

## 2017-03-30 NOTE — Telephone Encounter (Signed)
Acetaminophen 500 mg tid as needed. Also may help topical Icy Hot or [place]per cream on areas she has soreness.  Thanks, BJ

## 2017-03-30 NOTE — Telephone Encounter (Signed)
Left voicemail for daughter with instructions per Dr. [name].

## 2017-03-30 NOTE — H&P (Signed)
TOTAL KNEE ADMISSION H&P  Patient is being admitted for left total knee arthroplasty.  Subjective:  Chief Complaint:   Left knee primary OA / pain  HPI: [name], 77 y.o. female, has a history of pain and functional disability in the left knee due to arthritis and has failed non-surgical conservative treatments for greater than 12 weeks to include NSAID's and/or analgesics and activity modification.  Onset of symptoms was gradual, starting ~6 years ago with gradually worsening course since that time. The patient noted no past surgery on the left knee(s).  Patient currently rates pain in the left knee(s) at 4 out of 10 with activity, more issues with instability. Patient has worsening of pain with activity and weight bearing, pain that interferes with activities of daily living, pain with passive range of motion, crepitus and joint swelling.  Patient has evidence of periarticular osteophytes and joint space narrowing by imaging studies.  There is no active infection.  Risks, benefits and expectations were discussed with the patient.  Risks including but not limited to the risk of anesthesia, blood clots, nerve damage, blood vessel damage, failure of the prosthesis, infection and up to and including death.  Patient understand the risks, benefits and expectations and wishes to proceed with surgery.   PCP: [name], [name], MD  D/C Plans:       Home   Post-op Meds:       No Rx given   Tranexamic Acid:      To be given - IV   Decadron:      Is to be given  FYI:     Xarelto then ASA (PAT and worried about clots)  Norco  No Celebrex  DME:   Pt already has equipment  PT:   OPPT Rx given   Patient Active Problem List   Diagnosis Date Noted  . Unstable gait 01/12/2017  . Generalized osteoarthritis of hand 01/12/2017  . RLS (restless legs syndrome) 11/09/2016  . GERD (gastroesophageal reflux disease) 07/13/2016  . Class 2 severe obesity due to excess calories with serious comorbidity  and body mass index (BMI) of 36.0 to 36.9 in adult ([place]) 06/01/2016  . Vitamin D deficiency 06/01/2016  . Vitamin B12 deficiency 03/27/2016  . Chronic cough 03/16/2016  . Left foot pain 03/16/2016  . Demand ischemia ([place]) 02/14/2015  . Degenerative drusen 07/13/2013  . Type 2 diabetes mellitus without ophthalmic manifestations ([place]) 07/13/2013  . DM (diabetes mellitus) type II controlled, neurological manifestation ([place]) 05/07/2012  . Paroxysmal atrial tachycardia ([place]) 05/07/2012  . Essential hypertension, benign 05/07/2012  . Depression 05/07/2012  . Hyperlipidemia 05/07/2012  . Fibromyalgia 05/07/2012   Past Medical History:  Diagnosis Date  . Anxiety   . Arthritis   . Depression   . Essential hypertension   . Fibromyalgia   . Hyperlipemia   . PSVT (paroxysmal supraventricular tachycardia) ([place])   . Reflux   . Restless leg syndrome   . Type 2 diabetes mellitus ([place])     Past Surgical History:  Procedure Laterality Date  . ABDOMINAL HYSTERECTOMY    . ANKLE SURGERY    . BACK SURGERY     T12-L1 fusion, Anterior Cervical fusion  . CARDIAC CATHETERIZATION N/A 02/16/2015   Procedure: Left Heart Cath and Coronary Angiography;  Surgeon: Sherren Mocha, MD;  Location: [facility];  Service: Cardiovascular;  Laterality: N/A;  . CHOLECYSTECTOMY    . COLONOSCOPY    . ESOPHAGOGASTRODUODENOSCOPY  12/21/2010   Procedure: ESOPHAGOGASTRODUODENOSCOPY (EGD);  Surgeon: Landry Dyke, MD;  Location: [facility];  Service: Endoscopy;  Laterality: N/A;  . EXCISION MORTON'S NEUROMA    . FRACTURE SURGERY    . KNEE SURGERY     x 3  . NECK SURGERY    . ROTATOR CUFF REPAIR    . TONSILLECTOMY    . UPPER GASTROINTESTINAL ENDOSCOPY    . WRIST SURGERY      No current facility-administered medications for this encounter.    Current Outpatient Medications  Medication Sig Dispense Refill Last Dose  . aspirin EC 81 MG tablet Take 81 mg by mouth at bedtime.    Taking  . Calcium  Citrate-Vitamin D (CALCIUM CITRATE +D PO) Take 1 tablet by mouth at bedtime.    Taking  . cholecalciferol (VITAMIN D) 1000 units tablet Take 1,000 Units by mouth daily.   Taking  . Cranberry 1000 MG CAPS Take 1 capsule by mouth 2 (two) times daily. + Vit C 40 mg   Taking  . Cyanocobalamin (VITAMIN B-12 PO) Take 1 tablet by mouth daily.   Taking  . digoxin (LANOXIN) 0.125 MG tablet Take 0.125 mg by mouth daily.    Taking  . ezetimibe (ZETIA) 10 MG tablet Take 1 tablet (10 mg total) by mouth daily. 90 tablet 2 Taking  . insulin glargine (LANTUS) 100 UNIT/ML injection Inject 60 Units into the skin at bedtime.    Taking  . loperamide (IMODIUM A-D) 2 MG tablet Take 2 mg by mouth 4 (four) times daily as needed for diarrhea or loose stools.     . metFORMIN (GLUCOPHAGE) 1000 MG tablet Take 1,000 mg by mouth 2 (two) times daily with a meal.    Taking  . metoprolol succinate (TOPROL-XL) 25 MG 24 hr tablet Take 1 tablet (25 mg total) by mouth daily. 90 tablet 1 Taking  . Multiple Vitamins-Minerals (PRESERVISION AREDS 2 PO) Take 1 capsule by mouth 2 (two) times daily.     . nitroGLYCERIN (NITROSTAT) 0.4 MG SL tablet Place 0.4 mg under the tongue every 5 (five) minutes as needed. As needed chest pain   Taking  . omeprazole (PRILOSEC) 20 MG capsule Take 1 capsule (20 mg total) by mouth 2 (two) times daily before a meal. (Patient taking differently: Take 20 mg by mouth at bedtime. ) 90 capsule 3 Taking  . ramipril (ALTACE) 10 MG capsule TAKE 1 CAPSULE BY MOUTH ONCE DAILY 90 capsule 0   . rOPINIRole (REQUIP) 1 MG tablet Take 1 tablet (1 mg total) by mouth at bedtime. 90 tablet 1 Taking  . sertraline (ZOLOFT) 100 MG tablet Take 1 tablet (100 mg total) by mouth daily. 30 tablet 3 Taking  . verapamil (VERELAN PM) 120 MG 24 hr capsule Take 120 mg by mouth daily as needed (tachycardia).    Taking  . verapamil (VERELAN PM) 240 MG 24 hr capsule Take 1 capsule (240 mg total) by mouth daily. 30 capsule 2 Taking  . RELION  INSULIN SYRINGE 1ML/31G 31G X 5/16" 1 ML MISC Use as directed (Patient not taking: Reported on 03/28/2017) 100 each 1 Not Taking at Unknown time   Allergies  Allergen Reactions  . Amoxil [Amoxicillin] Swelling and Other (See Comments)    Lips Has patient had a PCN reaction causing immediate rash, facial/tongue/throat swelling, SOB or lightheadedness with hypotension:YES Has patient had a PCN reaction causing severe rash involving mucus membranes or skin necrosis:No Has patient had a PCN reaction that required hospitalization:No Has patient had a PCN reaction  occurring within the last 10 years:Yes. If all of the above answers are "NO", then may proceed with Cephalosporin use.   . Chocolate Hives  . Demerol Nausea And Vomiting  . Lisinopril Itching and Swelling    Social History   Tobacco Use  . Smoking status: Never Smoker  . Smokeless tobacco: Never Used  Substance Use Topics  . Alcohol use: No    Family History  Problem Relation Age of Onset  . Heart failure Mother   . Hyperlipidemia Mother   . Lung cancer Father        Lymphoma  . Hyperlipidemia Maternal Grandmother   . Peripheral vascular disease Maternal Grandmother      Review of Systems  Constitutional: Negative.   HENT: Negative.   Eyes: Negative.   Respiratory: Negative.   Cardiovascular: Negative.   Gastrointestinal: Positive for diarrhea and heartburn.  Genitourinary: Positive for urgency.  Musculoskeletal: Positive for joint pain.  Skin: Negative.   Neurological: Negative.   Endo/Heme/Allergies: Negative.   Psychiatric/Behavioral: Positive for depression.    Objective:  Physical Exam  Constitutional: She is oriented to person, place, and time. She appears well-developed.  HENT:  Head: Normocephalic.  Eyes: Pupils are equal, round, and reactive to light.  Neck: Neck supple. No JVD present. No tracheal deviation present. No thyromegaly present.  Cardiovascular: Normal rate, regular rhythm and intact  distal pulses.  Respiratory: Effort normal and breath sounds normal. No respiratory distress. She has no wheezes.  GI: Soft. There is no tenderness. There is no guarding.  Musculoskeletal:       Left knee: She exhibits decreased range of motion, swelling and bony tenderness. She exhibits no ecchymosis, no deformity, no laceration and no erythema. Tenderness found.  Lymphadenopathy:    She has no cervical adenopathy.  Neurological: She is alert and oriented to person, place, and time. A sensory deficit (bilateral LE neuropathy) is present.  Skin: Skin is warm and dry.  Psychiatric: She has a normal mood and affect.      Labs:  Estimated body mass index is 35.85 kg/m as calculated from the following:   Height as of 01/12/17: 5\' 3"  (1.6 m).   Weight as of 01/12/17: 91.8 kg (202 lb 6 oz).   Imaging Review Plain radiographs demonstrate severe degenerative joint disease of the left knee(s). The bone quality appears to be good for age and reported activity level.  Assessment/Plan:  End stage arthritis, left knee   The patient history, physical examination, clinical judgment of the provider and imaging studies are consistent with end stage degenerative joint disease of the left knee(s) and total knee arthroplasty is deemed medically necessary. The treatment options including medical management, injection therapy arthroscopy and arthroplasty were discussed at length. The risks and benefits of total knee arthroplasty were presented and reviewed. The risks due to aseptic loosening, infection, stiffness, patella tracking problems, thromboembolic complications and other imponderables were discussed. The patient acknowledged the explanation, agreed to proceed with the plan and consent was signed. Patient is being admitted for inpatient treatment for surgery, pain control, PT, OT, prophylactic antibiotics, VTE prophylaxis, progressive ambulation and ADL's and discharge planning. The patient is planning  to be discharged home.      [place] Pugh Zaiden Ludlum   PA-C  03/30/2017, 1:15 PM

## 2017-04-04 NOTE — Patient Instructions (Signed)
[name]  04/04/2017   Your procedure is scheduled on: Tuesday 04/10/2017  Report to [facility] Main  Entrance              Report to admitting at  1025  AM   Call this number if you have problems the morning of surgery [phone]   How to Manage Your Diabetes Before and After Surgery  Why is it important to control my blood sugar before and after surgery? . Improving blood sugar levels before and after surgery helps healing and can limit problems. . A way of improving blood sugar control is eating a healthy diet by: o  Eating less sugar and carbohydrates o  Increasing activity/exercise o  Talking with your doctor about reaching your blood sugar goals . High blood sugars (greater than 180 mg/dL) can raise your risk of infections and slow your recovery, so you will need to focus on controlling your diabetes during the weeks before surgery. . Make sure that the doctor who takes care of your diabetes knows about your planned surgery including the date and location.  How do I manage my blood sugar before surgery? . Check your blood sugar at least 4 times a day, starting 2 days before surgery, to make sure that the level is not too high or low. o Check your blood sugar the morning of your surgery when you wake up and every 2 hours until you get to the Short Stay unit. . If your blood sugar is less than 70 mg/dL, you will need to treat for low blood sugar: o Do not take insulin. o Treat a low blood sugar (less than 70 mg/dL) with  cup of clear juice (cranberry or apple), 4 glucose tablets, OR glucose gel. o Recheck blood sugar in 15 minutes after treatment (to make sure it is greater than 70 mg/dL). If your blood sugar is not greater than 70 mg/dL on recheck, call [phone] for further instructions. . Report your blood sugar to the short stay nurse when you get to Short Stay.  . If you are admitted to the hospital after surgery: o Your blood sugar will  be checked by the staff and you will probably be given insulin after surgery (instead of oral diabetes medicines) to make sure you have good blood sugar levels. o The goal for blood sugar control after surgery is 80-180 mg/dL.   WHAT DO I DO ABOUT MY DIABETES MEDICATION?       Take the Metformin the day before surgery as usual.  . Do not take oral diabetes medicines (pills) the morning of surgery.  . THE NIGHT BEFORE SURGERY, take  30   units of    Lantus   insulin.        Remember: Do not eat food or drink liquids :After Midnight.     Take these medicines the morning of surgery with A SIP OF WATER: Digoxin (Lanoxin), Metoprolol Succinate(Toprol XL), Sertraline (Zoloft), Verapamil (Verelan)               DO NOT TAKE ANY DIABETIC MEDICATIONS DAY OF YOUR SURGERY                               You may not have any metal on your body including hair pins and  piercings  Do not wear jewelry, make-up, lotions, powders or perfumes, deodorant             Do not wear nail polish.  Do not shave  48 hours prior to surgery.              Men may shave face and neck.   Do not bring valuables to the hospital. [place].  Contacts, dentures or bridgework may not be worn into surgery.  Leave suitcase in the car. After surgery it may be brought to your room.                  Please read over the following fact sheets you were given: _____________________________________________________________________             [facility] - Preparing for Surgery Before surgery, you can play an important role.  Because skin is not sterile, your skin needs to be as free of germs as possible.  You can reduce the number of germs on your skin by washing with CHG (chlorahexidine gluconate) soap before surgery.  CHG is an antiseptic cleaner which kills germs and bonds with the skin to continue killing germs even after washing. Please DO NOT use if you have  an allergy to CHG or antibacterial soaps.  If your skin becomes reddened/irritated stop using the CHG and inform your nurse when you arrive at Short Stay. Do not shave (including legs and underarms) for at least 48 hours prior to the first CHG shower.  You may shave your face/neck. Please follow these instructions carefully:  1.  Shower with CHG Soap the night before surgery and the  morning of Surgery.  2.  If you choose to wash your hair, wash your hair first as usual with your  normal  shampoo.  3.  After you shampoo, rinse your hair and body thoroughly to remove the  shampoo.                           4.  Use CHG as you would any other liquid soap.  You can apply chg directly  to the skin and wash                       Gently with a scrungie or clean washcloth.  5.  Apply the CHG Soap to your body ONLY FROM THE NECK DOWN.   Do not use on face/ open                           Wound or open sores. Avoid contact with eyes, ears mouth and genitals (private parts).                       Wash face,  Genitals (private parts) with your normal soap.             6.  Wash thoroughly, paying special attention to the area where your surgery  will be performed.  7.  Thoroughly rinse your body with warm water from the neck down.  8.  DO NOT shower/wash with your normal soap after using and rinsing off  the CHG Soap.                9.  Pat yourself  dry with a clean towel.            10.  Wear clean pajamas.            11.  Place clean sheets on your bed the night of your first shower and do not  sleep with pets. Day of Surgery : Do not apply any lotions/deodorants the morning of surgery.  Please wear clean clothes to the hospital/surgery center.  FAILURE TO FOLLOW THESE INSTRUCTIONS MAY RESULT IN THE CANCELLATION OF YOUR SURGERY PATIENT SIGNATURE_________________________________  NURSE  SIGNATURE__________________________________  ________________________________________________________________________   Adam Phenix  An incentive spirometer is a tool that can help keep your lungs clear and active. This tool measures how well you are filling your lungs with each breath. Taking long deep breaths may help reverse or decrease the chance of developing breathing (pulmonary) problems (especially infection) following:  A long period of time when you are unable to move or be active. BEFORE THE PROCEDURE   If the spirometer includes an indicator to show your best effort, your nurse or respiratory therapist will set it to a desired goal.  If possible, sit up straight or lean slightly forward. Try not to slouch.  Hold the incentive spirometer in an upright position. INSTRUCTIONS FOR USE  1. Sit on the edge of your bed if possible, or sit up as far as you can in bed or on a chair. 2. Hold the incentive spirometer in an upright position. 3. Breathe out normally. 4. Place the mouthpiece in your mouth and seal your lips tightly around it. 5. Breathe in slowly and as deeply as possible, raising the piston or the ball toward the top of the column. 6. Hold your breath for 3-5 seconds or for as long as possible. Allow the piston or ball to fall to the bottom of the column. 7. Remove the mouthpiece from your mouth and breathe out normally. 8. Rest for a few seconds and repeat Steps 1 through 7 at least 10 times every 1-2 hours when you are awake. Take your time and take a few normal breaths between deep breaths. 9. The spirometer may include an indicator to show your best effort. Use the indicator as a goal to work toward during each repetition. 10. After each set of 10 deep breaths, practice coughing to be sure your lungs are clear. If you have an incision (the cut made at the time of surgery), support your incision when coughing by placing a pillow or rolled up towels firmly  against it. Once you are able to get out of bed, walk around indoors and cough well. You may stop using the incentive spirometer when instructed by your caregiver.  RISKS AND COMPLICATIONS  Take your time so you do not get dizzy or light-headed.  If you are in pain, you may need to take or ask for pain medication before doing incentive spirometry. It is harder to take a deep breath if you are having pain. AFTER USE  Rest and breathe slowly and easily.  It can be helpful to keep track of a log of your progress. Your caregiver can provide you with a simple table to help with this. If you are using the spirometer at home, follow these instructions: [place] IF:   You are having difficultly using the spirometer.  You have trouble using the spirometer as often as instructed.  Your pain medication is not giving enough relief while using the spirometer.  You develop fever of 100.5  F (38.1 C) or higher. SEEK IMMEDIATE MEDICAL CARE IF:   You cough up bloody sputum that had not been present before.  You develop fever of 102 F (38.9 C) or greater.  You develop worsening pain at or near the incision site. MAKE SURE YOU:   Understand these instructions.  Will watch your condition.  Will get help right away if you are not doing well or get worse. Document Released: 06/12/2006 Document Revised: 04/24/2011 Document Reviewed: 08/13/2006 Exit[employer] Patient Information 2014 [employer], [place].   ________________________________________________________________________  WHAT IS A BLOOD TRANSFUSION? Blood Transfusion Information  A transfusion is the replacement of blood or some of its parts. Blood is made up of multiple cells which provide different functions.  Red blood cells carry oxygen and are used for blood loss replacement.  White blood cells fight against infection.  Platelets control bleeding.  Plasma helps clot blood.  Other blood products are available for  specialized needs, such as hemophilia or other clotting disorders. BEFORE THE TRANSFUSION  Who gives blood for transfusions?   Healthy volunteers who are fully evaluated to make sure their blood is safe. This is blood bank blood. Transfusion therapy is the safest it has ever been in the practice of medicine. Before blood is taken from a donor, a complete history is taken to make sure that person has no history of diseases nor engages in risky social behavior (examples are intravenous drug use or sexual activity with multiple partners). The donor's travel history is screened to minimize risk of transmitting infections, such as malaria. The donated blood is tested for signs of infectious diseases, such as HIV and hepatitis. The blood is then tested to be sure it is compatible with you in order to minimize the chance of a transfusion reaction. If you or a relative donates blood, this is often done in anticipation of surgery and is not appropriate for emergency situations. It takes many days to process the donated blood. RISKS AND COMPLICATIONS Although transfusion therapy is very safe and saves many lives, the main dangers of transfusion include:   Getting an infectious disease.  Developing a transfusion reaction. This is an allergic reaction to something in the blood you were given. Every precaution is taken to prevent this. The decision to have a blood transfusion has been considered carefully by your caregiver before blood is given. Blood is not given unless the benefits outweigh the risks. AFTER THE TRANSFUSION  Right after receiving a blood transfusion, you will usually feel much better and more energetic. This is especially true if your red blood cells have gotten low (anemic). The transfusion raises the level of the red blood cells which carry oxygen, and this usually causes an energy increase.  The nurse administering the transfusion will monitor you carefully for complications. HOME CARE  INSTRUCTIONS  No special instructions are needed after a transfusion. You may find your energy is better. Speak with your caregiver about any limitations on activity for underlying diseases you may have. SEEK MEDICAL CARE IF:   Your condition is not improving after your transfusion.  You develop redness or irritation at the intravenous (IV) site. SEEK IMMEDIATE MEDICAL CARE IF:  Any of the following symptoms occur over the next 12 hours:  Shaking chills.  You have a temperature by mouth above 102 F (38.9 C), not controlled by medicine.  Chest, back, or muscle pain.  People around you feel you are not acting correctly or are confused.  Shortness of breath or difficulty breathing.  Dizziness and fainting.  You get a rash or develop hives.  You have a decrease in urine output.  Your urine turns a dark color or changes to pink, red, or brown. Any of the following symptoms occur over the next 10 days:  You have a temperature by mouth above 102 F (38.9 C), not controlled by medicine.  Shortness of breath.  Weakness after normal activity.  The white part of the eye turns yellow (jaundice).  You have a decrease in the amount of urine or are urinating less often.  Your urine turns a dark color or changes to pink, red, or brown. Document Released: 01/28/2000 Document Revised: 04/24/2011 Document Reviewed: 09/16/2007 [facility] [employer] Patient Information 2014 [place], [place].  _______________________________________________________________________

## 2017-04-04 NOTE — Progress Notes (Signed)
03/09/2017- Cardiac Clearance from Dr. Wynonia Lawman on chart with office notes.  03/09/2017- EKG from Dr. Wynonia Lawman on chart.

## 2017-04-05 ENCOUNTER — Encounter (HOSPITAL_COMMUNITY)
Admission: RE | Admit: 2017-04-05 | Discharge: 2017-04-05 | Disposition: A | Discharge status: Home / Self Care | Payer: PPO | Source: Ambulatory Visit | Attending: Orthopedic Surgery | Admitting: Orthopedic Surgery

## 2017-04-05 ENCOUNTER — Other Ambulatory Visit: Payer: Self-pay

## 2017-04-05 ENCOUNTER — Encounter (HOSPITAL_COMMUNITY): Payer: Self-pay

## 2017-04-05 DIAGNOSIS — Z01812 Encounter for preprocedural laboratory examination: Secondary | ICD-10-CM | POA: Diagnosis not present

## 2017-04-05 DIAGNOSIS — M1712 Unilateral primary osteoarthritis, left knee: Secondary | ICD-10-CM | POA: Insufficient documentation

## 2017-04-05 DIAGNOSIS — Z0183 Encounter for blood typing: Secondary | ICD-10-CM | POA: Diagnosis not present

## 2017-04-05 HISTORY — DX: Other complications of anesthesia, initial encounter: T88.59XA

## 2017-04-05 HISTORY — DX: Other specified postprocedural states: R11.2

## 2017-04-05 HISTORY — DX: Gastro-esophageal reflux disease without esophagitis: K21.9

## 2017-04-05 HISTORY — DX: Cardiac arrhythmia, unspecified: I49.9

## 2017-04-05 HISTORY — DX: Other specified postprocedural states: Z98.890

## 2017-04-05 HISTORY — DX: Adverse effect of unspecified anesthetic, initial encounter: T41.45XA

## 2017-04-05 LAB — CBC
HCT: 40.4 % (ref 36.0–46.0)
Hemoglobin: 13.7 g/dL (ref 12.0–15.0)
MCH: 29.7 pg (ref 26.0–34.0)
MCHC: 33.9 g/dL (ref 30.0–36.0)
MCV: 87.4 fL (ref 78.0–100.0)
Platelets: 196 10*3/uL (ref 150–400)
RBC: 4.62 MIL/uL (ref 3.87–5.11)
RDW: 14.2 % (ref 11.5–15.5)
WBC: 8.2 10*3/uL (ref 4.0–10.5)

## 2017-04-05 LAB — BASIC METABOLIC PANEL
Anion gap: 10 (ref 5–15)
BUN: 20 mg/dL (ref 6–20)
CO2: 20 mmol/L — ABNORMAL LOW (ref 22–32)
Calcium: 9.3 mg/dL (ref 8.9–10.3)
Chloride: 108 mmol/L (ref 101–111)
Creatinine, Ser: 0.88 mg/dL (ref 0.44–1.00)
GFR calc Af Amer: >60 mL/min (ref >60)
GFR calc non Af Amer: >60 mL/min (ref >60)
Glucose, Bld: 87 mg/dL (ref 65–99)
Potassium: 4.1 mmol/L (ref 3.5–5.1)
Sodium: 138 mmol/L (ref 135–145)

## 2017-04-05 LAB — SURGICAL PCR SCREEN
MRSA, PCR: NEGATIVE
Staphylococcus aureus: NEGATIVE

## 2017-04-05 LAB — GLUCOSE, CAPILLARY: Glucose-Capillary: 96 mg/dL (ref 65–99)

## 2017-04-06 LAB — HEMOGLOBIN A1C
Hgb A1c MFr Bld: 6.8 % — ABNORMAL HIGH (ref 4.8–5.6)
Mean Plasma Glucose: 148.46 mg/dL

## 2017-04-06 LAB — TYPE AND SCREEN
ABO/RH(D): A NEG
Antibody Screen: POSITIVE
DAT, IgG: NEGATIVE

## 2017-04-08 NOTE — Progress Notes (Signed)
HPI:   [name] is a 77 y.o. female, who is here today with her daughter for 3 months follow up.   She was last seen on 01/12/17.  Since her last OV she has done PT for unstable gait and has followed with ortho.  GERD:  Currently she is on Omeprazole 20 mg daily. She took PPI bid for a few weeks until symptoms resolved.  Symptoms well controlled. She is also avoiding foods that trigger symptoms.   Depression:  She is on Zoloft 100 mg daily. Greatly improvement. She has not had stressful situations.  She denies suicidal thoughts. No side effects.   Hypertension:    Currently on Verapamil 240 mg daily,Toprol Succinate 25 gm daily,and Ramipril 10 mg daily. She also takes Verapamil 120 mg daily prn.  Hx of atrial tachycardia with elevated troponin. Cardiac cath 02/2015 no significant CAD, coronary arteries widely patent.   She is taking medications as instructed, no side effects reported.  She has not noted unusual headache, visual changes, exertional chest pain, worsening dyspnea,  focal weakness, or edema.   Lab Results  Component Value Date   CREATININE 0.88 04/05/2017   BUN 20 04/05/2017   NA 138 04/05/2017   K 4.1 04/05/2017   CL 108 04/05/2017   CO2 20 (L) 04/05/2017     Diarrhea: According to her daughter "she takes Imodium a lot" 2-3 times per day.  Occasional fecal incontinence. Hx of IBS. This problem is chronic and getting worse for the past year.  Stool "looks like infection", "clumpy." Symptoms are worse in the morning. She has not identified exacerbating or alleviating factors.  Last colonoscopy in 2012,when she was having similar symptoms.   Denies abdominal pain, nausea, vomiting, blood in stool or melena.  She is also requesting pre op evaluation,she has a form for me to complete that she has to turn in today.  She is having left TKR surgery tomorrow. According to pt,she already was cleared by cardiologist. She had  already discontinued Aspirin and has not taken NSAID's in a week.  She has had lumbar surgery and denies having any complication during or after procedure. She does not exercise regularly due to chronic pain, Hx of SOB, which has been stable. She denies associated chest pain or diaphoresis.  DM II:  She is on Metformin 1000 mg bid. She has not had hypoglycemia. Denies abdominal pain, nausea,vomiting, polydipsia,polyuria, or polyphagia.   Lab Results  Component Value Date   HGBA1C 6.8 (H) 04/05/2017     Review of Systems  Constitutional: Positive for fatigue. Negative for activity change, appetite change and fever.  HENT: Negative for mouth sores, nosebleeds and trouble swallowing.   Eyes: Negative for redness and visual disturbance.  Respiratory: Negative for cough and wheezing.   Cardiovascular: Negative for chest pain, palpitations and leg swelling.  Gastrointestinal: Positive for diarrhea. Negative for abdominal pain, nausea and vomiting.  Endocrine: Negative for cold intolerance, heat intolerance, polydipsia, polyphagia and polyuria.  Genitourinary: Negative for decreased urine volume, dysuria and hematuria.  Musculoskeletal: Positive for arthralgias and myalgias. Negative for gait problem.  Skin: Negative for rash and wound.  Neurological: Negative for syncope, weakness and headaches.  Psychiatric/Behavioral: Negative for confusion. The patient is nervous/anxious.       Current Outpatient Medications on File Prior to Visit  Medication Sig Dispense Refill  . Calcium Citrate-Vitamin D (CALCIUM CITRATE +D PO) Take 1 tablet by mouth at bedtime.     Marland Kitchen  cholecalciferol (VITAMIN D) 1000 units tablet Take 1,000 Units by mouth daily.    . Cranberry 1000 MG CAPS Take 1 capsule by mouth 2 (two) times daily. + Vit C 40 mg    . Cyanocobalamin (VITAMIN B-12 PO) Take 1 tablet by mouth daily.    . digoxin (LANOXIN) 0.125 MG tablet Take 0.125 mg by mouth daily.     Marland Kitchen ezetimibe (ZETIA)  10 MG tablet Take 1 tablet (10 mg total) by mouth daily. 90 tablet 2  . insulin glargine (LANTUS) 100 UNIT/ML injection Inject 60 Units into the skin at bedtime.     Marland Kitchen loperamide (IMODIUM A-D) 2 MG tablet Take 2 mg by mouth 4 (four) times daily as needed for diarrhea or loose stools.    . metFORMIN (GLUCOPHAGE) 1000 MG tablet Take 1,000 mg by mouth 2 (two) times daily with a meal.     . metoprolol succinate (TOPROL-XL) 25 MG 24 hr tablet Take 1 tablet (25 mg total) by mouth daily. 90 tablet 1  . Multiple Vitamins-Minerals (PRESERVISION AREDS 2 PO) Take 1 capsule by mouth 2 (two) times daily.    . nitroGLYCERIN (NITROSTAT) 0.4 MG SL tablet Place 0.4 mg under the tongue every 5 (five) minutes as needed. As needed chest pain    . omeprazole (PRILOSEC) 20 MG capsule Take 1 capsule (20 mg total) by mouth 2 (two) times daily before a meal. (Patient taking differently: Take 20 mg by mouth at bedtime. ) 90 capsule 3  . ramipril (ALTACE) 10 MG capsule TAKE 1 CAPSULE BY MOUTH ONCE DAILY 90 capsule 0  . RELION INSULIN SYRINGE 1ML/31G 31G X 5/16" 1 ML MISC Use as directed 100 each 1  . rOPINIRole (REQUIP) 1 MG tablet Take 1 tablet (1 mg total) by mouth at bedtime. 90 tablet 1  . sertraline (ZOLOFT) 100 MG tablet Take 1 tablet (100 mg total) by mouth daily. 30 tablet 3  . verapamil (VERELAN PM) 120 MG 24 hr capsule Take 120 mg by mouth daily as needed (tachycardia).     . verapamil (VERELAN PM) 240 MG 24 hr capsule Take 1 capsule (240 mg total) by mouth daily. 30 capsule 2  . [START ON 04/28/2017] aspirin (ASPIRIN CHILDRENS) 81 MG chewable tablet Chew 1 tablet (81 mg total) by mouth 2 (two) times daily. Start the day after finishing Xarelto. Take for 4 weeks, then resume regular dose. 60 tablet 0  . docusate sodium (COLACE) 100 MG capsule Take 1 capsule (100 mg total) by mouth 2 (two) times daily. 10 capsule 0  . ferrous sulfate (FERROUSUL) 325 (65 FE) MG tablet Take 1 tablet (325 mg total) by mouth 3 (three)  times daily with meals.  3  . HYDROcodone-acetaminophen (NORCO) 7.5-325 MG tablet Take 1-2 tablets by mouth every 4 (four) hours as needed for moderate pain. 60 tablet 0  . methocarbamol (ROBAXIN) 500 MG tablet Take 1 tablet (500 mg total) by mouth every 6 (six) hours as needed for muscle spasms. 40 tablet 0  . polyethylene glycol (MIRALAX / GLYCOLAX) packet Take 17 g by mouth 2 (two) times daily. 14 each 0  . [START ON 04/13/2017] rivaroxaban (XARELTO) 10 MG TABS tablet Take 1 tablet (10 mg total) by mouth daily for 14 days. 14 tablet 0   No current facility-administered medications on file prior to visit.      Past Medical History:  Diagnosis Date  . Anxiety   . Arthritis   . Complication of anesthesia   .  Depression   . Dysrhythmia    Paroysmal Atrial Tachycardia  . Essential hypertension   . Fibromyalgia   . GERD (gastroesophageal reflux disease)   . Hyperlipemia   . PONV (postoperative nausea and vomiting)   . PSVT (paroxysmal supraventricular tachycardia) ([facility])   . Reflux   . Restless leg syndrome   . Type 2 diabetes mellitus (HCC)    Allergies  Allergen Reactions  . Amoxil [Amoxicillin] Swelling and Other (See Comments)    Lips Has patient had a PCN reaction causing immediate rash, facial/tongue/throat swelling, SOB or lightheadedness with hypotension:YES Has patient had a PCN reaction causing severe rash involving mucus membranes or skin necrosis:No Has patient had a PCN reaction that required hospitalization:No Has patient had a PCN reaction occurring within the last 10 years:Yes. If all of the above answers are "NO", then may proceed with Cephalosporin use.   . Chocolate Hives  . Demerol Nausea And Vomiting  . Lisinopril Itching and Swelling    Social History   Socioeconomic History  . Marital status: Widowed    Spouse name: [name]  . Number of children: 2  . Years of education: None  . Highest education level: None  Social Needs  . Financial resource strain:  None  . Food insecurity - worry: None  . Food insecurity - inability: None  . Transportation needs - medical: None  . Transportation needs - non-medical: None  Occupational History  . None  Tobacco Use  . Smoking status: Never Smoker  . Smokeless tobacco: Never Used  Substance and Sexual Activity  . Alcohol use: No  . Drug use: No  . Sexual activity: None  Other Topics Concern  . None  Social History Narrative   Lives with husband.  Independent of ADLs.    Vitals:   04/09/17 1044  BP: 130/76  Pulse: 88  Resp: 12  Temp: 98.3 F (36.8 C)  SpO2: 96%   Body mass index is 34.94 kg/m.  Wt Readings from Last 3 Encounters:  04/10/17 200 lb (90.7 kg)  04/09/17 200 lb 6 oz (90.9 kg)  04/05/17 201 lb 6.4 oz (91.4 kg)    Physical Exam  Nursing note and vitals reviewed. Constitutional: She is oriented to person, place, and time. She appears well-developed. No distress.  HENT:  Head: Normocephalic and atraumatic.  Mouth/Throat: Oropharynx is clear and moist and mucous membranes are normal.  Eyes: Conjunctivae are normal. Pupils are equal, round, and reactive to light.  Cardiovascular: Normal rate and regular rhythm.  No murmur heard. Pulses:      Dorsalis pedis pulses are 2+ on the right side, and 2+ on the left side.  Respiratory: Effort normal and breath sounds normal. No respiratory distress.  GI: Soft. She exhibits no mass. There is no hepatomegaly. There is no tenderness.  Musculoskeletal: She exhibits tenderness. She exhibits no edema.  Pain with movement on examination table.   Lymphadenopathy:    She has no cervical adenopathy.  Neurological: She is alert and oriented to person, place, and time. She has normal strength.  Stable gait with no assistance.  Skin: Skin is warm. No erythema.  Psychiatric: Her mood appears anxious.  Well groomed, good eye contact.   Diabetic Foot Exam - Simple   Simple Foot Form Diabetic Foot exam was performed with the following  findings:  Yes 04/09/2017  9:39 PM  Visual Inspection See comments:  Yes Sensation Testing Intact to touch and monofilament testing bilaterally:  Yes Pulse Check Posterior Tibialis  and Dorsalis pulse intact bilaterally:  Yes Comments Long hypertrophic toenails. Decreased monofilament bilateral.         ASSESSMENT AND PLAN:   [name] was seen today for 3 months follow-up.  No orders of the defined types were placed in this encounter.  Pre-op evaluation  She feels like TKR will improve quality of life. Instructed to take her Metoprolol tomorrow morning. Hold Metformin and resume a day after surgery if no complications. DVT prophylaxis: Early ambulation and oral anticoagulation. Xarelto was already recommended. Adequate glucose controlled, GLU < 180.  Form completed.  DM (diabetes mellitus) type II controlled, neurological manifestation ([place]) HgA1C at goal. She is  Not interested in stopping Metformin,so no changes in current management Regular exercise and healthy diet with avoidance of added sugar food intake is an important part of treatment and recommended. Annual eye exam, periodic dental and foot care recommended. F/U in 4 months   Essential hypertension, benign Adequately controlled. No changes in current management. DASH low salt diet to continue. Eye exam recommended annually. F/U in 4 months, before if needed.   GERD (gastroesophageal reflux disease) Improved. No changes in current management. GERD precautions discussed. F/U in 6-12 months.   Chronic diarrhea  IBS. She is not interested in decreasing or stopping Metformin. OTC Metamucil recommended. Low residual diet. Adequate hydration. Some side effects of Imodium discussed.     -[name] was advised to return sooner than planned today if new concerns arise.       Boluwatife Flight G. [name], MD  [facility] office.

## 2017-04-09 ENCOUNTER — Ambulatory Visit (INDEPENDENT_AMBULATORY_CARE_PROVIDER_SITE_OTHER): Payer: PPO | Admitting: Family Medicine

## 2017-04-09 ENCOUNTER — Encounter: Payer: Self-pay | Admitting: Family Medicine

## 2017-04-09 VITALS — BP 130/76 | HR 88 | Temp 98.3°F | Resp 12 | Ht 63.5 in | Wt 200.4 lb

## 2017-04-09 DIAGNOSIS — I10 Essential (primary) hypertension: Secondary | ICD-10-CM | POA: Diagnosis not present

## 2017-04-09 DIAGNOSIS — Z01818 Encounter for other preprocedural examination: Secondary | ICD-10-CM

## 2017-04-09 DIAGNOSIS — K219 Gastro-esophageal reflux disease without esophagitis: Secondary | ICD-10-CM | POA: Diagnosis not present

## 2017-04-09 DIAGNOSIS — E1140 Type 2 diabetes mellitus with diabetic neuropathy, unspecified: Secondary | ICD-10-CM

## 2017-04-09 DIAGNOSIS — K529 Noninfective gastroenteritis and colitis, unspecified: Secondary | ICD-10-CM

## 2017-04-09 DIAGNOSIS — Z794 Long term (current) use of insulin: Secondary | ICD-10-CM | POA: Diagnosis not present

## 2017-04-09 MED ORDER — SODIUM CHLORIDE 0.9 % IV SOLN
1000.0000 mg | INTRAVENOUS | Status: AC
  Administered 2017-04-10: 1000 mg via INTRAVENOUS
  Filled 2017-04-09: qty 1100

## 2017-04-09 MED ORDER — GENTAMICIN SULFATE 40 MG/ML IJ SOLN
5.0000 mg/kg | INTRAVENOUS | Status: DC
  Administered 2017-04-10: 340 mg via INTRAVENOUS
  Filled 2017-04-09: qty 8.5

## 2017-04-09 NOTE — Assessment & Plan Note (Signed)
Improved. No changes in current management. GERD precautions discussed. F/U in 6-12 months.

## 2017-04-09 NOTE — Patient Instructions (Addendum)
A few things to remember from today's visit:   Controlled type 2 diabetes mellitus with diabetic neuropathy, with long-term current use of insulin ([place])  Essential hypertension, benign  Gastroesophageal reflux disease, esophagitis presence not specified  Pre-op evaluation     Please be sure medication list is accurate. If a new problem present, please set up appointment sooner than planned today.

## 2017-04-09 NOTE — Assessment & Plan Note (Signed)
Adequately controlled. No changes in current management. DASH low salt diet to continue. Eye exam recommended annually. F/U in 4 months, before if needed.

## 2017-04-09 NOTE — Assessment & Plan Note (Signed)
HgA1C at goal. She is  Not interested in stopping Metformin,so no changes in current management Regular exercise and healthy diet with avoidance of added sugar food intake is an important part of treatment and recommended. Annual eye exam, periodic dental and foot care recommended. F/U in 4 months

## 2017-04-10 ENCOUNTER — Inpatient Hospital Stay (HOSPITAL_COMMUNITY): Payer: PPO | Admitting: Certified Registered Nurse Anesthetist

## 2017-04-10 ENCOUNTER — Other Ambulatory Visit: Payer: Self-pay

## 2017-04-10 ENCOUNTER — Observation Stay (HOSPITAL_COMMUNITY)
Admission: RE | Admit: 2017-04-10 | Discharge: 2017-04-11 | Disposition: A | Discharge status: Home / Self Care | Payer: PPO | Source: Ambulatory Visit | Attending: Orthopedic Surgery | Admitting: Orthopedic Surgery

## 2017-04-10 ENCOUNTER — Encounter (HOSPITAL_COMMUNITY)
Admission: RE | Disposition: A | Discharge status: Home / Self Care | Payer: Self-pay | Source: Ambulatory Visit | Attending: Orthopedic Surgery

## 2017-04-10 ENCOUNTER — Encounter (HOSPITAL_COMMUNITY): Payer: Self-pay | Admitting: *Deleted

## 2017-04-10 DIAGNOSIS — F329 Major depressive disorder, single episode, unspecified: Secondary | ICD-10-CM | POA: Diagnosis not present

## 2017-04-10 DIAGNOSIS — M25762 Osteophyte, left knee: Secondary | ICD-10-CM | POA: Diagnosis not present

## 2017-04-10 DIAGNOSIS — Z794 Long term (current) use of insulin: Secondary | ICD-10-CM | POA: Insufficient documentation

## 2017-04-10 DIAGNOSIS — F419 Anxiety disorder, unspecified: Secondary | ICD-10-CM | POA: Insufficient documentation

## 2017-04-10 DIAGNOSIS — E538 Deficiency of other specified B group vitamins: Secondary | ICD-10-CM | POA: Diagnosis not present

## 2017-04-10 DIAGNOSIS — G2581 Restless legs syndrome: Secondary | ICD-10-CM | POA: Diagnosis not present

## 2017-04-10 DIAGNOSIS — M1712 Unilateral primary osteoarthritis, left knee: Secondary | ICD-10-CM | POA: Diagnosis not present

## 2017-04-10 DIAGNOSIS — E559 Vitamin D deficiency, unspecified: Secondary | ICD-10-CM | POA: Insufficient documentation

## 2017-04-10 DIAGNOSIS — Z7982 Long term (current) use of aspirin: Secondary | ICD-10-CM | POA: Diagnosis not present

## 2017-04-10 DIAGNOSIS — Z96659 Presence of unspecified artificial knee joint: Secondary | ICD-10-CM

## 2017-04-10 DIAGNOSIS — E1139 Type 2 diabetes mellitus with other diabetic ophthalmic complication: Secondary | ICD-10-CM | POA: Insufficient documentation

## 2017-04-10 DIAGNOSIS — E1140 Type 2 diabetes mellitus with diabetic neuropathy, unspecified: Secondary | ICD-10-CM | POA: Diagnosis not present

## 2017-04-10 DIAGNOSIS — Z6836 Body mass index (BMI) 36.0-36.9, adult: Secondary | ICD-10-CM

## 2017-04-10 DIAGNOSIS — K219 Gastro-esophageal reflux disease without esophagitis: Secondary | ICD-10-CM | POA: Diagnosis not present

## 2017-04-10 DIAGNOSIS — Z96652 Presence of left artificial knee joint: Secondary | ICD-10-CM

## 2017-04-10 DIAGNOSIS — I10 Essential (primary) hypertension: Secondary | ICD-10-CM | POA: Diagnosis not present

## 2017-04-10 DIAGNOSIS — Z6834 Body mass index (BMI) 34.0-34.9, adult: Secondary | ICD-10-CM | POA: Diagnosis not present

## 2017-04-10 DIAGNOSIS — Z79899 Other long term (current) drug therapy: Secondary | ICD-10-CM | POA: Insufficient documentation

## 2017-04-10 DIAGNOSIS — E785 Hyperlipidemia, unspecified: Secondary | ICD-10-CM | POA: Diagnosis not present

## 2017-04-10 DIAGNOSIS — G8918 Other acute postprocedural pain: Secondary | ICD-10-CM | POA: Diagnosis not present

## 2017-04-10 HISTORY — PX: TOTAL KNEE ARTHROPLASTY: SHX125

## 2017-04-10 LAB — TYPE AND SCREEN
ABO/RH(D): A NEG
Antibody Screen: POSITIVE

## 2017-04-10 LAB — GLUCOSE, CAPILLARY
Glucose-Capillary: 88 mg/dL (ref 65–99)
Glucose-Capillary: 132 mg/dL — ABNORMAL HIGH (ref 65–99)
Glucose-Capillary: 180 mg/dL — ABNORMAL HIGH (ref 65–99)

## 2017-04-10 SURGERY — ARTHROPLASTY, KNEE, TOTAL
Anesthesia: Spinal | Site: Knee | Laterality: Left

## 2017-04-10 MED ORDER — ONDANSETRON HCL 4 MG/2ML IJ SOLN
INTRAMUSCULAR | Status: AC
  Filled 2017-04-10: qty 2

## 2017-04-10 MED ORDER — SODIUM CHLORIDE 0.9 % IR SOLN
Status: DC | PRN
  Administered 2017-04-10: 1000 mL

## 2017-04-10 MED ORDER — PROPOFOL 10 MG/ML IV BOLUS
INTRAVENOUS | Status: AC
Start: 2017-04-10 — End: ?
  Filled 2017-04-10: qty 20

## 2017-04-10 MED ORDER — ROPIVACAINE HCL 7.5 MG/ML IJ SOLN
INTRAMUSCULAR | Status: DC | PRN
  Administered 2017-04-10: 20 mL via PERINEURAL

## 2017-04-10 MED ORDER — MAGNESIUM CITRATE PO SOLN: 1.0000 | Freq: Once | ORAL | Status: DC | PRN

## 2017-04-10 MED ORDER — PHENOL 1.4 % MT LIQD
1.0000 | OROMUCOSAL | Status: DC | PRN
  Filled 2017-04-10: qty 177

## 2017-04-10 MED ORDER — ACETAMINOPHEN 650 MG RE SUPP: 650.0000 mg | RECTAL | Status: DC | PRN

## 2017-04-10 MED ORDER — METFORMIN HCL 500 MG PO TABS
1000.0000 mg | ORAL_TABLET | Freq: Two times a day (BID) | ORAL | Status: DC
  Administered 2017-04-10 – 2017-04-11 (×2): 1000 mg via ORAL
  Filled 2017-04-10 (×2): qty 2

## 2017-04-10 MED ORDER — BUPIVACAINE-EPINEPHRINE 0.25% -1:200000 IJ SOLN
INTRAMUSCULAR | Status: AC
  Filled 2017-04-10: qty 1

## 2017-04-10 MED ORDER — HYDROCODONE-ACETAMINOPHEN 7.5-325 MG PO TABS: 2.0000 | ORAL_TABLET | ORAL | Status: DC | PRN

## 2017-04-10 MED ORDER — METHOCARBAMOL 500 MG PO TABS: 500.0000 mg | ORAL_TABLET | Freq: Four times a day (QID) | ORAL | 0 refills | Status: DC | PRN

## 2017-04-10 MED ORDER — DIPHENHYDRAMINE HCL 12.5 MG/5ML PO ELIX: 12.5000 mg | ORAL_SOLUTION | ORAL | Status: DC | PRN

## 2017-04-10 MED ORDER — DOCUSATE SODIUM 100 MG PO CAPS
100.0000 mg | ORAL_CAPSULE | Freq: Two times a day (BID) | ORAL | Status: DC
  Administered 2017-04-10 – 2017-04-11 (×2): 100 mg via ORAL
  Filled 2017-04-10 (×2): qty 1

## 2017-04-10 MED ORDER — DOCUSATE SODIUM 100 MG PO CAPS: 100.0000 mg | ORAL_CAPSULE | Freq: Two times a day (BID) | ORAL | 0 refills | Status: DC

## 2017-04-10 MED ORDER — SODIUM CHLORIDE 0.9 % IJ SOLN
INTRAMUSCULAR | Status: AC
  Filled 2017-04-10: qty 50

## 2017-04-10 MED ORDER — DEXAMETHASONE SODIUM PHOSPHATE 10 MG/ML IJ SOLN
10.0000 mg | Freq: Once | INTRAMUSCULAR | Status: AC
  Administered 2017-04-11: 10 mg via INTRAVENOUS
  Filled 2017-04-10: qty 1

## 2017-04-10 MED ORDER — NITROGLYCERIN 0.4 MG SL SUBL: 0.4000 mg | SUBLINGUAL_TABLET | SUBLINGUAL | Status: DC | PRN

## 2017-04-10 MED ORDER — PROPOFOL 10 MG/ML IV BOLUS
INTRAVENOUS | Status: AC
  Filled 2017-04-10: qty 20

## 2017-04-10 MED ORDER — STERILE WATER FOR IRRIGATION IR SOLN
Status: DC | PRN
  Administered 2017-04-10: 2000 mL

## 2017-04-10 MED ORDER — RIVAROXABAN 10 MG PO TABS: 10.0000 mg | ORAL_TABLET | Freq: Every day | ORAL | 0 refills | Status: DC

## 2017-04-10 MED ORDER — HYDROCODONE-ACETAMINOPHEN 7.5-325 MG PO TABS: 1.0000 | ORAL_TABLET | ORAL | 0 refills | Status: DC | PRN

## 2017-04-10 MED ORDER — HYDROMORPHONE HCL 1 MG/ML IJ SOLN: 0.2500 mg | INTRAMUSCULAR | Status: DC | PRN

## 2017-04-10 MED ORDER — ASPIRIN 81 MG PO CHEW: 81.0000 mg | CHEWABLE_TABLET | Freq: Two times a day (BID) | ORAL | 0 refills | Status: AC

## 2017-04-10 MED ORDER — ONDANSETRON HCL 4 MG PO TABS: 4.0000 mg | ORAL_TABLET | Freq: Three times a day (TID) | ORAL | Status: DC | PRN

## 2017-04-10 MED ORDER — ONDANSETRON HCL 4 MG/2ML IJ SOLN
INTRAMUSCULAR | Status: DC | PRN
  Administered 2017-04-10: 4 mg via INTRAVENOUS

## 2017-04-10 MED ORDER — MENTHOL 3 MG MT LOZG: 1.0000 | LOZENGE | OROMUCOSAL | Status: DC | PRN

## 2017-04-10 MED ORDER — PROPOFOL 10 MG/ML IV BOLUS
INTRAVENOUS | Status: AC
  Filled 2017-04-10: qty 40

## 2017-04-10 MED ORDER — PHENYLEPHRINE HCL 10 MG/ML IJ SOLN
INTRAMUSCULAR | Status: DC | PRN
  Administered 2017-04-10: 25 ug/min via INTRAVENOUS

## 2017-04-10 MED ORDER — EZETIMIBE 10 MG PO TABS
10.0000 mg | ORAL_TABLET | Freq: Every day | ORAL | Status: DC
Start: 2017-04-10 — End: 2017-04-11
  Administered 2017-04-10: 10 mg via ORAL
  Filled 2017-04-10: qty 1

## 2017-04-10 MED ORDER — CRANBERRY 1000 MG PO CAPS: 1.0000 | ORAL_CAPSULE | Freq: Two times a day (BID) | ORAL | Status: DC

## 2017-04-10 MED ORDER — KETOROLAC TROMETHAMINE 30 MG/ML IJ SOLN
INTRAMUSCULAR | Status: DC | PRN
  Administered 2017-04-10: 30 mg

## 2017-04-10 MED ORDER — PROPOFOL 500 MG/50ML IV EMUL
INTRAVENOUS | Status: DC | PRN
  Administered 2017-04-10: 75 ug/kg/min via INTRAVENOUS

## 2017-04-10 MED ORDER — SODIUM CHLORIDE 0.9 % IV SOLN
INTRAVENOUS | Status: DC
  Administered 2017-04-10 – 2017-04-11 (×2): via INTRAVENOUS

## 2017-04-10 MED ORDER — METOCLOPRAMIDE HCL 5 MG PO TABS: 5.0000 mg | ORAL_TABLET | Freq: Three times a day (TID) | ORAL | Status: DC | PRN

## 2017-04-10 MED ORDER — RIVAROXABAN 10 MG PO TABS
10.0000 mg | ORAL_TABLET | ORAL | Status: DC
  Administered 2017-04-11: 10 mg via ORAL
  Filled 2017-04-10: qty 1

## 2017-04-10 MED ORDER — TRANEXAMIC ACID 1000 MG/10ML IV SOLN
1000.0000 mg | Freq: Once | INTRAVENOUS | Status: AC
  Administered 2017-04-10: 1000 mg via INTRAVENOUS
  Filled 2017-04-10: qty 1100

## 2017-04-10 MED ORDER — CHLORHEXIDINE GLUCONATE 4 % EX LIQD: 60.0000 mL | Freq: Once | CUTANEOUS | Status: DC

## 2017-04-10 MED ORDER — EPHEDRINE 5 MG/ML INJ
INTRAVENOUS | Status: AC
  Filled 2017-04-10: qty 10

## 2017-04-10 MED ORDER — BUPIVACAINE IN DEXTROSE 0.75-8.25 % IT SOLN
INTRATHECAL | Status: DC | PRN
  Administered 2017-04-10: 1.6 mL via INTRATHECAL

## 2017-04-10 MED ORDER — METHOCARBAMOL 1000 MG/10ML IJ SOLN
500.0000 mg | Freq: Four times a day (QID) | INTRAVENOUS | Status: DC | PRN
  Administered 2017-04-10: 500 mg via INTRAVENOUS
  Filled 2017-04-10: qty 550

## 2017-04-10 MED ORDER — ALUM & MAG HYDROXIDE-SIMETH 200-200-20 MG/5ML PO SUSP: 15.0000 mL | ORAL | Status: DC | PRN

## 2017-04-10 MED ORDER — ACETAMINOPHEN 325 MG PO TABS: 650.0000 mg | ORAL_TABLET | ORAL | Status: DC | PRN

## 2017-04-10 MED ORDER — VERAPAMIL HCL ER 120 MG PO TBCR
120.0000 mg | EXTENDED_RELEASE_TABLET | Freq: Every day | ORAL | Status: DC | PRN
  Filled 2017-04-10: qty 1

## 2017-04-10 MED ORDER — METOCLOPRAMIDE HCL 5 MG/ML IJ SOLN: 5.0000 mg | Freq: Three times a day (TID) | INTRAMUSCULAR | Status: DC | PRN

## 2017-04-10 MED ORDER — METHOCARBAMOL 500 MG PO TABS
500.0000 mg | ORAL_TABLET | Freq: Four times a day (QID) | ORAL | Status: DC | PRN
  Administered 2017-04-11: 500 mg via ORAL
  Filled 2017-04-10: qty 1

## 2017-04-10 MED ORDER — MIDAZOLAM HCL 2 MG/2ML IJ SOLN
INTRAMUSCULAR | Status: AC
  Filled 2017-04-10: qty 2

## 2017-04-10 MED ORDER — LACTATED RINGERS IV SOLN
INTRAVENOUS | Status: DC
  Administered 2017-04-10 (×2): via INTRAVENOUS

## 2017-04-10 MED ORDER — VANCOMYCIN HCL IN DEXTROSE 1-5 GM/200ML-% IV SOLN
1000.0000 mg | INTRAVENOUS | Status: AC
  Administered 2017-04-10: 1000 mg via INTRAVENOUS
  Filled 2017-04-10: qty 200

## 2017-04-10 MED ORDER — ROPINIROLE HCL 1 MG PO TABS
1.0000 mg | ORAL_TABLET | Freq: Every day | ORAL | Status: DC
Start: 2017-04-10 — End: 2017-04-11
  Administered 2017-04-10: 1 mg via ORAL
  Filled 2017-04-10: qty 1

## 2017-04-10 MED ORDER — SODIUM CHLORIDE 0.9 % IJ SOLN
INTRAMUSCULAR | Status: DC | PRN
  Administered 2017-04-10: 30 mL

## 2017-04-10 MED ORDER — EPHEDRINE SULFATE 50 MG/ML IJ SOLN
INTRAMUSCULAR | Status: DC | PRN
  Administered 2017-04-10 (×2): 10 mg via INTRAVENOUS

## 2017-04-10 MED ORDER — POLYETHYLENE GLYCOL 3350 17 G PO PACK
17.0000 g | PACK | Freq: Two times a day (BID) | ORAL | Status: DC
  Filled 2017-04-10: qty 1

## 2017-04-10 MED ORDER — NON FORMULARY: 20.0000 mg | Freq: Every day | Status: DC

## 2017-04-10 MED ORDER — FENTANYL CITRATE (PF) 100 MCG/2ML IJ SOLN
50.0000 ug | INTRAMUSCULAR | Status: DC
  Administered 2017-04-10: 100 ug via INTRAVENOUS

## 2017-04-10 MED ORDER — OMEPRAZOLE 20 MG PO CPDR
20.0000 mg | DELAYED_RELEASE_CAPSULE | Freq: Every day | ORAL | Status: DC
  Administered 2017-04-10: 20 mg via ORAL
  Filled 2017-04-10: qty 1

## 2017-04-10 MED ORDER — VERAPAMIL HCL ER 240 MG PO TBCR
240.0000 mg | EXTENDED_RELEASE_TABLET | Freq: Every day | ORAL | Status: DC
  Administered 2017-04-10: 240 mg via ORAL
  Filled 2017-04-10: qty 1

## 2017-04-10 MED ORDER — SERTRALINE HCL 100 MG PO TABS
100.0000 mg | ORAL_TABLET | Freq: Every day | ORAL | Status: DC
  Administered 2017-04-10: 100 mg via ORAL
  Filled 2017-04-10: qty 1

## 2017-04-10 MED ORDER — DEXAMETHASONE SODIUM PHOSPHATE 10 MG/ML IJ SOLN
INTRAMUSCULAR | Status: AC
  Filled 2017-04-10: qty 1

## 2017-04-10 MED ORDER — METOPROLOL SUCCINATE ER 25 MG PO TB24
25.0000 mg | ORAL_TABLET | Freq: Every day | ORAL | Status: DC
Start: 2017-04-11 — End: 2017-04-11
  Administered 2017-04-11: 25 mg via ORAL
  Filled 2017-04-10: qty 1

## 2017-04-10 MED ORDER — INSULIN GLARGINE 100 UNIT/ML ~~LOC~~ SOLN
60.0000 [IU] | Freq: Every day | SUBCUTANEOUS | Status: DC
  Administered 2017-04-10: 60 [IU] via SUBCUTANEOUS
  Filled 2017-04-10 (×2): qty 0.6

## 2017-04-10 MED ORDER — BISACODYL 10 MG RE SUPP: 10.0000 mg | Freq: Every day | RECTAL | Status: DC | PRN

## 2017-04-10 MED ORDER — MIDAZOLAM HCL 2 MG/2ML IJ SOLN
1.0000 mg | INTRAMUSCULAR | Status: DC
Start: 2017-04-10 — End: 2017-04-11

## 2017-04-10 MED ORDER — VANCOMYCIN HCL IN DEXTROSE 1-5 GM/200ML-% IV SOLN
1000.0000 mg | Freq: Two times a day (BID) | INTRAVENOUS | Status: AC
  Administered 2017-04-11: 1000 mg via INTRAVENOUS
  Filled 2017-04-10: qty 200

## 2017-04-10 MED ORDER — FERROUS SULFATE 325 (65 FE) MG PO TABS: 325.0000 mg | ORAL_TABLET | Freq: Three times a day (TID) | ORAL | 3 refills | Status: DC

## 2017-04-10 MED ORDER — HYDROCODONE-ACETAMINOPHEN 7.5-325 MG PO TABS
1.0000 | ORAL_TABLET | ORAL | Status: DC | PRN
  Administered 2017-04-10 – 2017-04-11 (×5): 1 via ORAL
  Filled 2017-04-10 (×7): qty 1

## 2017-04-10 MED ORDER — BUPIVACAINE-EPINEPHRINE (PF) 0.25% -1:200000 IJ SOLN
INTRAMUSCULAR | Status: DC | PRN
  Administered 2017-04-10: 30 mL

## 2017-04-10 MED ORDER — HYDROMORPHONE HCL 1 MG/ML IJ SOLN
0.5000 mg | INTRAMUSCULAR | Status: DC | PRN
  Administered 2017-04-11: 0.5 mg via INTRAVENOUS
  Filled 2017-04-10: qty 1

## 2017-04-10 MED ORDER — PROMETHAZINE HCL 25 MG/ML IJ SOLN: 6.2500 mg | INTRAMUSCULAR | Status: DC | PRN

## 2017-04-10 MED ORDER — PHENYLEPHRINE HCL 10 MG/ML IJ SOLN
INTRAMUSCULAR | Status: AC
  Filled 2017-04-10: qty 1

## 2017-04-10 MED ORDER — KETOROLAC TROMETHAMINE 30 MG/ML IJ SOLN
INTRAMUSCULAR | Status: AC
  Filled 2017-04-10: qty 1

## 2017-04-10 MED ORDER — ONDANSETRON HCL 4 MG/2ML IJ SOLN: 4.0000 mg | Freq: Three times a day (TID) | INTRAMUSCULAR | Status: DC | PRN

## 2017-04-10 MED ORDER — INSULIN ASPART 100 UNIT/ML ~~LOC~~ SOLN: 0.0000 [IU] | Freq: Three times a day (TID) | SUBCUTANEOUS | Status: DC

## 2017-04-10 MED ORDER — POLYETHYLENE GLYCOL 3350 17 G PO PACK: 17.0000 g | PACK | Freq: Two times a day (BID) | ORAL | 0 refills | Status: DC

## 2017-04-10 MED ORDER — DEXAMETHASONE SODIUM PHOSPHATE 10 MG/ML IJ SOLN
10.0000 mg | Freq: Once | INTRAMUSCULAR | Status: AC
Start: 2017-04-10 — End: 2017-04-10
  Administered 2017-04-10: 10 mg via INTRAVENOUS

## 2017-04-10 MED ORDER — DIGOXIN 125 MCG PO TABS
0.1250 mg | ORAL_TABLET | Freq: Every day | ORAL | Status: DC
  Administered 2017-04-11: 0.125 mg via ORAL
  Filled 2017-04-10: qty 1

## 2017-04-10 MED ORDER — FERROUS SULFATE 325 (65 FE) MG PO TABS
325.0000 mg | ORAL_TABLET | Freq: Three times a day (TID) | ORAL | Status: DC
  Administered 2017-04-11 (×2): 325 mg via ORAL
  Filled 2017-04-10 (×2): qty 1

## 2017-04-10 MED ORDER — FENTANYL CITRATE (PF) 100 MCG/2ML IJ SOLN
INTRAMUSCULAR | Status: AC
  Administered 2017-04-10: 100 ug via INTRAVENOUS
  Filled 2017-04-10: qty 2

## 2017-04-10 SURGICAL SUPPLY — 49 items
ADH SKN CLS APL DERMABOND .7 (GAUZE/BANDAGES/DRESSINGS) ×1
BAG DECANTER FOR FLEXI CONT (MISCELLANEOUS) IMPLANT
BAG SPEC THK2 15X12 ZIP CLS (MISCELLANEOUS)
BAG ZIPLOCK 12X15 (MISCELLANEOUS) IMPLANT
BANDAGE ACE 6X5 VEL STRL LF (GAUZE/BANDAGES/DRESSINGS) ×2 IMPLANT
BLADE SAW SGTL 11.0X1.19X90.0M (BLADE) IMPLANT
BLADE SAW SGTL 13.0X1.19X90.0M (BLADE) ×2 IMPLANT
BONE CEMENT GENTAMICIN (Cement) ×4 IMPLANT
BOWL SMART MIX CTS (DISPOSABLE) ×2 IMPLANT
CAPT KNEE TOTAL 3 ATTUNE ×1 IMPLANT
CEMENT BONE GENTAMICIN 40 (Cement) IMPLANT
COVER SURGICAL LIGHT HANDLE (MISCELLANEOUS) ×2 IMPLANT
CUFF TOURN SGL QUICK 34 (TOURNIQUET CUFF) ×2
CUFF TRNQT CYL 34X4X40X1 (TOURNIQUET CUFF) ×1 IMPLANT
DECANTER SPIKE VIAL GLASS SM (MISCELLANEOUS) ×2 IMPLANT
DERMABOND ADVANCED (GAUZE/BANDAGES/DRESSINGS) ×1
DERMABOND ADVANCED .7 DNX12 (GAUZE/BANDAGES/DRESSINGS) ×1 IMPLANT
DRAPE U-SHAPE 47X51 STRL (DRAPES) ×2 IMPLANT
DRESSING AQUACEL AG SP 3.5X10 (GAUZE/BANDAGES/DRESSINGS) ×1 IMPLANT
DRSG AQUACEL AG SP 3.5X10 (GAUZE/BANDAGES/DRESSINGS) ×2
DURAPREP 26ML APPLICATOR (WOUND CARE) ×4 IMPLANT
ELECT REM PT RETURN 15FT ADLT (MISCELLANEOUS) ×2 IMPLANT
GLOVE BIOGEL M 7.0 STRL (GLOVE) IMPLANT
GLOVE BIOGEL PI IND STRL 7.0 (GLOVE) IMPLANT
GLOVE BIOGEL PI IND STRL 7.5 (GLOVE) ×1 IMPLANT
GLOVE BIOGEL PI IND STRL 8.5 (GLOVE) ×1 IMPLANT
GLOVE BIOGEL PI INDICATOR 7.0 (GLOVE) ×6
GLOVE BIOGEL PI INDICATOR 7.5 (GLOVE) ×1
GLOVE BIOGEL PI INDICATOR 8.5 (GLOVE) ×1
GLOVE ECLIPSE 8.0 STRL XLNG CF (GLOVE) ×2 IMPLANT
GLOVE ORTHO TXT STRL SZ7.5 (GLOVE) ×4 IMPLANT
GOWN STRL REUS W/TWL LRG LVL3 (GOWN DISPOSABLE) ×4 IMPLANT
GOWN STRL REUS W/TWL XL LVL3 (GOWN DISPOSABLE) ×3 IMPLANT
HANDPIECE INTERPULSE COAX TIP (DISPOSABLE) ×2
MANIFOLD NEPTUNE II (INSTRUMENTS) ×2 IMPLANT
PACK TOTAL KNEE CUSTOM (KITS) ×2 IMPLANT
POSITIONER SURGICAL ARM (MISCELLANEOUS) ×2 IMPLANT
SET HNDPC FAN SPRY TIP SCT (DISPOSABLE) ×1 IMPLANT
SET PAD KNEE POSITIONER (MISCELLANEOUS) ×2 IMPLANT
SUT MNCRL AB 4-0 PS2 18 (SUTURE) ×2 IMPLANT
SUT STRATAFIX PDS+ 0 24IN (SUTURE) ×4 IMPLANT
SUT VIC AB 1 CT1 36 (SUTURE) ×2 IMPLANT
SUT VIC AB 2-0 CT1 27 (SUTURE) ×6
SUT VIC AB 2-0 CT1 TAPERPNT 27 (SUTURE) ×3 IMPLANT
SYR 50ML LL SCALE MARK (SYRINGE) ×2 IMPLANT
TRAY FOLEY CATH 14FR (SET/KITS/TRAYS/PACK) ×1 IMPLANT
WATER STERILE IRR 1000ML POUR (IV SOLUTION) ×3 IMPLANT
WRAP KNEE MAXI GEL POST OP (GAUZE/BANDAGES/DRESSINGS) ×2 IMPLANT
YANKAUER SUCT BULB TIP 10FT TU (MISCELLANEOUS) ×2 IMPLANT

## 2017-04-10 NOTE — Progress Notes (Signed)
Assisted Dr. Rose with left, ultrasound guided, adductor canal block. Side rails up, monitors on throughout procedure. See vital signs in flow sheet. Tolerated Procedure well.  

## 2017-04-10 NOTE — Anesthesia Preprocedure Evaluation (Addendum)
Anesthesia Evaluation  Patient identified by MRN, date of birth, ID band Patient awake    Reviewed: Allergy & Precautions, NPO status , Patient's Chart, lab work & pertinent test results  History of Anesthesia Complications (+) PONV  Airway Mallampati: II  TM Distance: <3 FB Neck ROM: Full    Dental no notable dental hx.    Pulmonary neg pulmonary ROS,    Pulmonary exam normal breath sounds clear to auscultation       Cardiovascular hypertension, Pt. on medications and Pt. on home beta blockers Normal cardiovascular exam Rhythm:Regular Rate:Normal     Neuro/Psych negative neurological ROS  negative psych ROS   GI/Hepatic Neg liver ROS, GERD  ,  Endo/Other  diabetes, Insulin Dependent  Renal/GU negative Renal ROS  negative genitourinary   Musculoskeletal negative musculoskeletal ROS (+)   Abdominal   Peds negative pediatric ROS (+)  Hematology negative hematology ROS (+)   Anesthesia Other Findings   Reproductive/Obstetrics negative OB ROS                             Anesthesia Physical Anesthesia Plan  ASA: III  Anesthesia Plan: Spinal   Post-op Pain Management:  Regional for Post-op pain   Induction: Intravenous  PONV Risk Score and Plan: 2 and Ondansetron and Dexamethasone  Airway Management Planned: Simple Face Mask  Additional Equipment:   Intra-op Plan:   Post-operative Plan:   Informed Consent: I have reviewed the patients History and Physical, chart, labs and discussed the procedure including the risks, benefits and alternatives for the proposed anesthesia with the patient or authorized representative who has indicated his/her understanding and acceptance.   Dental advisory given  Plan Discussed with: CRNA and Surgeon  Anesthesia Plan Comments:        Anesthesia Quick Evaluation

## 2017-04-10 NOTE — Anesthesia Postprocedure Evaluation (Signed)
Anesthesia Post Note  Patient: [name]  Procedure(s) Performed: LEFT TOTAL KNEE ARTHROPLASTY (Left Knee)     Patient location during evaluation: PACU Anesthesia Type: Spinal Level of consciousness: oriented and awake and alert Pain management: pain level controlled Vital Signs Assessment: post-procedure vital signs reviewed and stable Respiratory status: spontaneous breathing, respiratory function stable and patient connected to nasal cannula oxygen Cardiovascular status: blood pressure returned to baseline and stable Postop Assessment: no headache, no backache and no apparent nausea or vomiting Anesthetic complications: no    Last Vitals:  Vitals:   04/10/17 1530 04/10/17 1545  BP: 129/83 (!) 142/73  Pulse: 64 68  Resp: 16 14  Temp:    SpO2: 97% 98%    Last Pain:  Vitals:   04/10/17 1545  TempSrc:   PainSc: 0-No pain                 Eloy Fehl S

## 2017-04-10 NOTE — Anesthesia Procedure Notes (Signed)
Spinal  Patient location during procedure: OR Start time: 04/10/2017 12:11 PM End time: 04/10/2017 12:20 PM Staffing Anesthesiologist: Myrtie Soman, MD Performed: anesthesiologist  Preanesthetic Checklist Completed: patient identified, site marked, surgical consent, pre-op evaluation, timeout performed, IV checked, risks and benefits discussed and monitors and equipment checked Spinal Block Patient position: sitting Prep: Betadine Patient monitoring: heart rate, continuous pulse ox and blood pressure Injection technique: single-shot Needle Needle type: Sprotte  Needle gauge: 24 G Needle length: 9 cm Additional Notes Expiration date of kit checked and confirmed. Patient tolerated procedure well, without complications.

## 2017-04-10 NOTE — Discharge Instructions (Addendum)

## 2017-04-10 NOTE — Progress Notes (Signed)
PHARMACIST - PHYSICIAN ORDER COMMUNICATION  CONCERNING: P&T Medication Policy on Herbal Medications  DESCRIPTION:  This patient's order for:  Cranberry  has been noted.  This product(s) is classified as an "herbal" or natural product. Due to a lack of definitive safety studies or FDA approval, nonstandard manufacturing practices, plus the potential risk of unknown drug-drug interactions while on inpatient medications, the Pharmacy and Therapeutics Committee does not permit the use of "herbal" or natural products of this type within [place].   ACTION TAKEN: The pharmacy department is unable to verify this order at this time and your patient has been informed of this safety policy. Please reevaluate patient's clinical condition at discharge and address if the herbal or natural product(s) should be resumed at that time.   Benecio Kluger, PharmD 

## 2017-04-10 NOTE — Anesthesia Procedure Notes (Signed)
Anesthesia Regional Block: Adductor canal block   Pre-Anesthetic Checklist: ,, timeout performed, Correct Patient, Correct Site, Correct Laterality, Correct Procedure, Correct Position, site marked, Risks and benefits discussed,  Surgical consent,  Pre-op evaluation,  At surgeon's request and post-op pain management  Laterality: Left  Prep: chloraprep       Needles:  Injection technique: Single-shot  Needle Type: Echogenic Needle     Needle Length: 9cm      Additional Needles:   Procedures:,,,, ultrasound used (permanent image in chart),,,,  Narrative:  Start time: 04/10/2017 11:58 AM End time: 04/10/2017 12:07 PM Injection made incrementally with aspirations every 5 mL.  Performed by: Personally  Anesthesiologist: Myrtie Soman, MD  Additional Notes: Patient tolerated the procedure well without complications

## 2017-04-10 NOTE — Anesthesia Procedure Notes (Signed)
Anesthesia Procedure Image    

## 2017-04-10 NOTE — Interval H&P Note (Signed)
History and Physical Interval Note:  04/10/2017 11:11 AM  [name]  has presented today for surgery, with the diagnosis of Left knee osteoarthritis  The various methods of treatment have been discussed with the patient and family. After consideration of risks, benefits and other options for treatment, the patient has consented to  Procedure(s) with comments: LEFT TOTAL KNEE ARTHROPLASTY (Left) - 70 mins as a surgical intervention .  The patient's history has been reviewed, patient examined, no change in status, stable for surgery.  I have reviewed the patient's chart and labs.  Questions were answered to the patient's satisfaction.     Mauri Pole

## 2017-04-10 NOTE — Transfer of Care (Signed)
Immediate Anesthesia Transfer of Care Note  Patient: [name]  Procedure(s) Performed: LEFT TOTAL KNEE ARTHROPLASTY (Left Knee)  Patient Location: PACU  Anesthesia Type:Spinal  Level of Consciousness: awake, alert  and oriented  Airway & Oxygen Therapy: Patient Spontanous Breathing and Patient connected to face mask oxygen  Post-op Assessment: Report given to RN and Post -op Vital signs reviewed and stable  Post vital signs: Reviewed and stable  Last Vitals:  Vitals:   04/10/17 1207 04/10/17 1208  BP: (!) 160/81   Pulse: 69 66  Resp: 15 19  Temp:    SpO2: 97% 99%    Last Pain:  Vitals:   04/10/17 1046  TempSrc:   PainSc: 7          Complications: No apparent anesthesia complications

## 2017-04-10 NOTE — Op Note (Signed)
NAME:  [name]                      MEDICAL RECORD NO.:  [id number]                             FACILITY:  [facility]      PHYSICIAN:  Pietro Cassis. Alvan Dame, M.D.  DATE OF BIRTH:  12/02/1940      DATE OF PROCEDURE:  04/10/2017                                     OPERATIVE REPORT         PREOPERATIVE DIAGNOSIS:  Left knee osteoarthritis.      POSTOPERATIVE DIAGNOSIS:  Left knee osteoarthritis.      FINDINGS:  The patient was noted to have complete loss of cartilage and   bone-on-bone arthritis with associated osteophytes in the lateral and patellofemoral compartments of   the knee with a significant synovitis and associated effusion.      PROCEDURE:  Left total knee replacement.      COMPONENTS USED:  DePuy Attune rotating platform posterior stabilized knee   system, a size 6N femur, 4 tibia, size 8 mm PS AOX insert, and 35 anatomic patellar   button.      SURGEON:  Pietro Cassis. Alvan Dame, M.D.      ASSISTANT:  Danae Orleans, PA-C.      ANESTHESIA:  Regional and Spinal.      SPECIMENS:  None.      COMPLICATION:  None.      DRAINS:  None.  EBL: <100cc      TOURNIQUET TIME:   Total Tourniquet Time Documented: Thigh (Left) - 24 minutes Total: Thigh (Left) - 24 minutes  .      The patient was stable to the recovery room.      INDICATION FOR PROCEDURE:  [name] is a 77 y.o. female patient of   mine.  The patient had been seen, evaluated, and treated conservatively in the   office with medication, activity modification, and injections.  The patient had   radiographic changes of bone-on-bone arthritis with endplate sclerosis and osteophytes noted.      The patient failed conservative measures including medication, injections, and activity modification, and at this point was ready for more definitive measures.   Based on the radiographic changes and failed conservative measures, the patient   decided to proceed with total knee replacement.  Risks of infection,   DVT,  component failure, need for revision surgery, postop course, and   expectations were all   discussed and reviewed.  Consent was obtained for benefit of pain   relief.      PROCEDURE IN DETAIL:  The patient was brought to the operative theater.   Once adequate anesthesia, preoperative antibiotics, 2 gm of Ancef, 1 gm of Tranexamic Acid, and 10 mg of Decadron administered, the patient was positioned supine with the left thigh tourniquet placed.  The  left lower extremity was prepped and draped in sterile fashion.  A time-   out was performed identifying the patient, planned procedure, and   extremity.      The left lower extremity was placed in the [facility] leg holder.  The leg was   exsanguinated, tourniquet elevated to 250 mmHg.  A midline incision was  made followed by median parapatellar arthrotomy.  Following initial   exposure, attention was first directed to the patella.  Precut   measurement was noted to be 20 mm.  I resected down to 12-13 mm and used a   35 patellar button to restore patellar height as well as cover the cut   surface.      The lug holes were drilled and a metal shim was placed to protect the   patella from retractors and saw blades.      At this point, attention was now directed to the femur.  The femoral   canal was opened with a drill, irrigated to try to prevent fat emboli.  An   intramedullary rod was passed at 3 degrees valgus, 8 mm of bone was   resected off the distal femur due to passive hyperextension pre-op.  Following this resection, the tibia was   subluxated anteriorly.  Using the extramedullary guide, 3 mm of bone was resected off   the proximal medial tibia.  We confirmed the gap would be   stable medially and laterally with a size 6 spacer block as well as confirmed   the cut was perpendicular in the coronal plane, checking with an alignment rod.      Once this was done, I sized the femur to be a size 6 in the anterior-   posterior dimension,  chose a narrow component based on medial and   lateral dimension.  The size 6 rotation block was then pinned in   position anterior referenced using the C-clamp to set rotation.  The   anterior, posterior, and  chamfer cuts were made without difficulty nor   notching making certain that I was along the anterior cortex to help   with flexion gap stability.      The final box cut was made off the lateral aspect of distal femur.      At this point, the tibia was sized to be a size 4, the size 4 tray was   then pinned in position through the medial third of the tubercle,   drilled, and keel punched.  Trial reduction was now carried with a size 6 femur,  4 tibia, a size 6 then up to the 8 mm PS insert, and the 35 anatomic patella botton.  The knee was brought to   extension, full extension with good flexion stability with the patella   tracking through the trochlea without application of pressure.  Given   all these findings the femoral lug holes were drilled and then the trial components removed.  Final components were   opened and cement was mixed.  The knee was irrigated with normal saline   solution and pulse lavage.  The synovial lining was   then injected with 30 cc of 0.25% Marcaine with epinephrine and 1 cc of Toradol plus 30 cc of NS for a total of 61 cc.      The knee was irrigated.  Final implants were then cemented onto clean and   dried cut surfaces of bone with the knee brought to extension with a size 8 mm PS trial insert.      Once the cement had fully cured, the excess cement was removed   throughout the knee.  I confirmed I was satisfied with the range of   motion and stability, and the final size 8 mm PS AOX insert was chosen.  It was   placed into the knee.  The tourniquet had been let down at 24 minutes.  No significant   hemostasis required.  The   extensor mechanism was then reapproximated using #1 Vicryl and #1 Stratafix sutures with the knee   in flexion.  The    remaining wound was closed with 2-0 Vicryl and running 4-0 Monocryl.   The knee was cleaned, dried, dressed sterilely using Dermabond and   Aquacel dressing.  The patient was then   brought to recovery room in stable condition, tolerating the procedure   well.   Please note that Physician Assistant, Danae Orleans, PA-C, was present for the entirety of the case, and was utilized for pre-operative positioning, peri-operative retractor management, general facilitation of the procedure.  He was also utilized for primary wound closure at the end of the case.              Pietro Cassis Alvan Dame, M.D.    04/10/2017 1:37 PM

## 2017-04-11 DIAGNOSIS — M1712 Unilateral primary osteoarthritis, left knee: Secondary | ICD-10-CM | POA: Diagnosis not present

## 2017-04-11 LAB — BASIC METABOLIC PANEL
Anion gap: 7 (ref 5–15)
BUN: 19 mg/dL (ref 6–20)
CO2: 24 mmol/L (ref 22–32)
Calcium: 8.3 mg/dL — ABNORMAL LOW (ref 8.9–10.3)
Chloride: 104 mmol/L (ref 101–111)
Creatinine, Ser: 0.81 mg/dL (ref 0.44–1.00)
GFR calc Af Amer: >60 mL/min (ref >60)
GFR calc non Af Amer: >60 mL/min (ref >60)
Glucose, Bld: 141 mg/dL — ABNORMAL HIGH (ref 65–99)
Potassium: 4.9 mmol/L (ref 3.5–5.1)
Sodium: 135 mmol/L (ref 135–145)

## 2017-04-11 LAB — CBC
HCT: 34 % — ABNORMAL LOW (ref 36.0–46.0)
Hemoglobin: 11.5 g/dL — ABNORMAL LOW (ref 12.0–15.0)
MCH: 29.9 pg (ref 26.0–34.0)
MCHC: 33.8 g/dL (ref 30.0–36.0)
MCV: 88.3 fL (ref 78.0–100.0)
Platelets: 184 10*3/uL (ref 150–400)
RBC: 3.85 MIL/uL — ABNORMAL LOW (ref 3.87–5.11)
RDW: 13.8 % (ref 11.5–15.5)
WBC: 10.6 10*3/uL — ABNORMAL HIGH (ref 4.0–10.5)

## 2017-04-11 LAB — GLUCOSE, CAPILLARY
Glucose-Capillary: 249 mg/dL — ABNORMAL HIGH (ref 65–99)
Glucose-Capillary: 127 mg/dL — ABNORMAL HIGH (ref 65–99)

## 2017-04-11 MED ORDER — EZETIMIBE 10 MG PO TABS: 10.0000 mg | ORAL_TABLET | ORAL | Status: DC

## 2017-04-11 MED ORDER — SERTRALINE HCL 100 MG PO TABS: 100.0000 mg | ORAL_TABLET | ORAL | Status: DC

## 2017-04-11 NOTE — Progress Notes (Signed)
     Subjective: 1 Day Post-Op Procedure(s) (LRB): LEFT TOTAL KNEE ARTHROPLASTY (Left)   Patient reports pain as mild, pain controlled. No events throughout the night. Dr Alvan Dame discussed the procedure and how the surgery proceeded.  Patient states that she is wanting to go home.  Patient ready to be discharged home if she does well with PT.   Objective:   VITALS:   Vitals:   04/11/17 0120 04/11/17 0633  BP: 127/64 (!) 162/73  Pulse: 66 67  Resp: 13 14  Temp: 97.9 F (36.6 C) 97.7 F (36.5 C)  SpO2: 100% 96%    Dorsiflexion/Plantar flexion intact Incision: dressing C/D/I No cellulitis present Compartment soft  LABS Recent Labs    04/11/17 0536  HGB 11.5*  HCT 34.0*  WBC 10.6*  PLT 184    Recent Labs    04/11/17 0536  NA 135  K 4.9  BUN 19  CREATININE 0.81  GLUCOSE 141*     Assessment/Plan: 1 Day Post-Op Procedure(s) (LRB): LEFT TOTAL KNEE ARTHROPLASTY (Left) Foley cath d/c'ed Advance diet Up with therapy D/C IV fluids Discharge home Follow up in 2 weeks at [facility]. Follow up with OLIN,Taivon Haroon D in 2 weeks.  Contact information:  [facility] [address], [place] [phone]    Obese (BMI 30-39.9) Estimated body mass index is 34.87 kg/m as calculated from the following:   Height as of this encounter: 5' 3.5" (1.613 m).   Weight as of this encounter: 90.7 kg (200 lb). Patient also counseled that weight may inhibit the healing process Patient counseled that losing weight will help with future health issues      West Pugh. Kaydence Menard   PAC  04/11/2017, 7:45 AM

## 2017-04-11 NOTE — Evaluation (Signed)
Occupational Therapy Evaluation Patient Details Name: [name] MRN: [id number] DOB: Jun 30, 1940 Today's Date: 04/11/2017    History of Present Illness Pt admitted for L TKA. PMH: DM, fibromyalgia, arthritis, HTN.   Clinical Impression   Pt reports many falls leading up to surgery. All education completed with pt and daughter verbalizing and/or demonstrating understanding. Pt's daughter is an Therapist, sports and will be assisting pt 24 hours at home.  No further OT needs.    Follow Up Recommendations  No OT follow up    Equipment Recommendations  None recommended by OT    Recommendations for Other Services       Precautions / Restrictions Precautions Precautions: Knee;Fall Restrictions Weight Bearing Restrictions: No      Mobility Bed Mobility               General bed mobility comments: pt in chair  Transfers Overall transfer level: Needs assistance Equipment used: Rolling walker (2 wheeled) Transfers: Sit to/from Stand Sit to Stand: Min assist         General transfer comment: cues for technique, min assist to rise and steady    Balance                                           ADL either performed or assessed with clinical judgement   ADL Overall ADL's : Needs assistance/impaired Eating/Feeding: Independent;Sitting   Grooming: Min guard;Standing   Upper Body Bathing: Set up;Sitting   Lower Body Bathing: Minimal assistance;Sit to/from stand Lower Body Bathing Details (indicate cue type and reason): pt has a long handled bath sponge at home Upper Body Dressing : Set up;Sitting   Lower Body Dressing: Minimal assistance;Sit to/from stand Lower Body Dressing Details (indicate cue type and reason): pt able to don socks in sitting Toilet Transfer: Minimal assistance;Ambulation;RW Toilet Transfer Details (indicate cue type and reason): instructed in use of 3 in 1 over toilet Toileting- Clothing Manipulation and Hygiene: Minimal assistance;Sit  to/from Nurse, children's Details (indicate cue type and reason): demonstrated technique for walk in shower transfer to pt and daughter Functional mobility during ADLs: Minimal assistance;Rolling walker       Vision Baseline Vision/History: Wears glasses Wears Glasses: At all times Patient Visual Report: No change from baseline       Perception     Praxis      Pertinent Vitals/Pain Pain Assessment: 0-10 Pain Score: 5  Pain Location: L knee Pain Descriptors / Indicators: Sore;Guarding Pain Intervention(s): Monitored during session;Premedicated before session;Repositioned;Ice applied     Hand Dominance Right   Extremity/Trunk Assessment Upper Extremity Assessment Upper Extremity Assessment: RUE deficits/detail RUE Deficits / Details: hx of RCR   Lower Extremity Assessment Lower Extremity Assessment: Defer to PT evaluation   Cervical / Trunk Assessment Cervical / Trunk Assessment: Other exceptions(spinal stenosis)   Communication Communication Communication: No difficulties   Cognition Arousal/Alertness: Awake/alert Behavior During Therapy: WFL for tasks assessed/performed Overall Cognitive Status: Within Functional Limits for tasks assessed                                     General Comments       Exercises     Shoulder Instructions      Home Living Family/patient expects to be discharged to:: Private residence Living  Arrangements: Alone Available Help at Discharge: Family;Available 24 hours/day Type of Home: House Home Access: Ramped entrance     Home Layout: One level     Bathroom Shower/Tub: Occupational psychologist: Handicapped height     Home Equipment: Clinical cytogeneticist - 2 wheels;Bedside commode;Hand held shower head;Cane - single point(transport chair)          Prior Functioning/Environment Level of Independence: Independent with assistive device(s)        Comments: used either the walker or  the cane, many falls        OT Problem List: Impaired balance (sitting and/or standing);Pain;Obesity      OT Treatment/Interventions:      OT Goals(Current goals can be found in the care plan section) Acute Rehab OT Goals Patient Stated Goal: to stop falling  OT Frequency:     Barriers to D/C:            Co-evaluation              AM-PAC PT "6 Clicks" Daily Activity     Outcome Measure Help from another person eating meals?: None Help from another person taking care of personal grooming?: A Little Help from another person toileting, which includes using toliet, bedpan, or urinal?: A Little Help from another person bathing (including washing, rinsing, drying)?: A Little Help from another person to put on and taking off regular upper body clothing?: None Help from another person to put on and taking off regular lower body clothing?: A Little 6 Click Score: 20   End of Session Equipment Utilized During Treatment: Rolling walker;Gait belt  Activity Tolerance: Patient tolerated treatment well Patient left: in chair;with call bell/phone within reach;with chair alarm set;with family/visitor present  OT Visit Diagnosis: Unsteadiness on feet (R26.81);Other abnormalities of gait and mobility (R26.89);Pain;History of falling (Z91.81)                Time: 8099-8338 OT Time Calculation (min): 28 min Charges:  OT General Charges $OT Visit: 1 Visit OT Evaluation $OT Eval Moderate Complexity: 1 Mod OT Treatments $Self Care/Home Management : 8-22 mins G-Codes:     05-07-2017 Nestor Lewandowsky, OTR/L Pager: [phone]  Werner Lean, Haze Boyden 05/07/2017, 11:21 AM

## 2017-04-11 NOTE — Discharge Summary (Signed)
Physician Discharge Summary  Patient ID: [name] MRN: [id number] DOB/AGE: August 31, 1940 77 y.o.  Admit date: 04/10/2017 Discharge date:  04/11/2017  Procedures:  Procedure(s) (LRB): LEFT TOTAL KNEE ARTHROPLASTY (Left)  Attending Physician:  Dr. Paralee Cancel   Admission Diagnoses:    Left knee primary OA / pain  Discharge Diagnoses:  Principal Problem:   S/P left TKA Active Problems:   Class 2 severe obesity due to excess calories with serious comorbidity and body mass index (BMI) of 36.0 to 36.9 in adult [facility])  Past Medical History:  Diagnosis Date  . Anxiety   . Arthritis   . Complication of anesthesia   . Depression   . Dysrhythmia    Paroysmal Atrial Tachycardia  . Essential hypertension   . Fibromyalgia   . GERD (gastroesophageal reflux disease)   . Hyperlipemia   . PONV (postoperative nausea and vomiting)   . PSVT (paroxysmal supraventricular tachycardia) ([place])   . Reflux   . Restless leg syndrome   . Type 2 diabetes mellitus (HCC)     HPI:    [name], 77 y.o. female, has a history of pain and functional disability in the left knee due to arthritis and has failed non-surgical conservative treatments for greater than 12 weeks to include NSAID's and/or analgesics and activity modification.  Onset of symptoms was gradual, starting ~6 years ago with gradually worsening course since that time. The patient noted no past surgery on the left knee(s).  Patient currently rates pain in the left knee(s) at 4 out of 10 with activity, more issues with instability. Patient has worsening of pain with activity and weight bearing, pain that interferes with activities of daily living, pain with passive range of motion, crepitus and joint swelling.  Patient has evidence of periarticular osteophytes and joint space narrowing by imaging studies.  There is no active infection.  Risks, benefits and expectations were discussed with the patient.  Risks including but not limited to  the risk of anesthesia, blood clots, nerve damage, blood vessel damage, failure of the prosthesis, infection and up to and including death.  Patient understand the risks, benefits and expectations and wishes to proceed with surgery.  PCP: [name], [name], MD   Discharged Condition: good  Hospital Course:  Patient underwent the above stated procedure on 04/10/2017. Patient tolerated the procedure well and brought to the recovery room in good condition and subsequently to the floor.  POD #1 BP: 162/73 ; Pulse: 67 ; Temp: 97.7 F (36.5 C) ; Resp: 14 Patient reports pain as mild, pain controlled. No events throughout the night. Dr Alvan Dame discussed the procedure and how the surgery proceeded.  Patient states that she is wanting to go home.  Patient ready to be discharged home. Dorsiflexion/plantar flexion intact, incision: dressing C/D/I, no cellulitis present and compartment soft.   LABS  Basename    HGB     11.5  HCT     34.0    Discharge Exam: General appearance: alert, cooperative and no distress Extremities: Homans sign is negative, no sign of DVT, no edema, redness or tenderness in the calves or thighs and no ulcers, gangrene or trophic changes  Disposition: Home with follow up in 2 weeks   Follow-up Information    Paralee Cancel, MD. Schedule an appointment as soon as possible for a visit in 2 week(s).   Specialty:  Orthopedic Surgery Contact information: [address] [place] [address] [phone]  Discharge Instructions    Call MD / Call 911   Complete by:  As directed    If you experience chest pain or shortness of breath, CALL 911 and be transported to the hospital emergency room.  If you develope a fever above 101 F, pus (white drainage) or increased drainage or redness at the wound, or calf pain, call your surgeon's office.   Change dressing   Complete by:  As directed    Maintain surgical dressing until follow up in the clinic. If the  edges start to pull up, may reinforce with tape. If the dressing is no longer working, may remove and cover with gauze and tape, but must keep the area dry and clean.  Call with any questions or concerns.   Constipation Prevention   Complete by:  As directed    Drink plenty of fluids.  Prune juice may be helpful.  You may use a stool softener, such as Colace (over the counter) 100 mg twice a day.  Use MiraLax (over the counter) for constipation as needed.   Diet - low sodium heart healthy   Complete by:  As directed    Discharge instructions   Complete by:  As directed    Maintain surgical dressing until follow up in the clinic. If the edges start to pull up, may reinforce with tape. If the dressing is no longer working, may remove and cover with gauze and tape, but must keep the area dry and clean.  Follow up in 2 weeks at [facility]. Call with any questions or concerns.   Increase activity slowly as tolerated   Complete by:  As directed    Weight bearing as tolerated with assist device (walker, cane, etc) as directed, use it as long as suggested by your surgeon or therapist, typically at least 4-6 weeks.   TED hose   Complete by:  As directed    Use stockings (TED hose) for 2 weeks on both leg(s).  You may remove them at night for sleeping.      Allergies as of 04/11/2017      Reactions   Amoxil [amoxicillin] Swelling, Other (See Comments)   Lips Has patient had a PCN reaction causing immediate rash, facial/tongue/throat swelling, SOB or lightheadedness with hypotension:YES Has patient had a PCN reaction causing severe rash involving mucus membranes or skin necrosis:No Has patient had a PCN reaction that required hospitalization:No Has patient had a PCN reaction occurring within the last 10 years:Yes. If all of the above answers are "NO", then may proceed with Cephalosporin use.   Chocolate Hives   Demerol Nausea And Vomiting   Lisinopril Itching, Swelling        Medication List    STOP taking these medications   aspirin EC 81 MG tablet Replaced by:  aspirin 81 MG chewable tablet     TAKE these medications   aspirin 81 MG chewable tablet Commonly known as:  ASPIRIN CHILDRENS Chew 1 tablet (81 mg total) by mouth 2 (two) times daily. Start the day after finishing Xarelto. Take for 4 weeks, then resume regular dose. Start taking on:  04/28/2017 Replaces:  aspirin EC 81 MG tablet   CALCIUM CITRATE +D PO Take 1 tablet by mouth at bedtime.   cholecalciferol 1000 units tablet Commonly known as:  VITAMIN D Take 1,000 Units by mouth daily.   Cranberry 1000 MG Caps Take 1 capsule by mouth 2 (two) times daily. + Vit C 40 mg   digoxin 0.125  MG tablet Commonly known as:  LANOXIN Take 0.125 mg by mouth daily.   docusate sodium 100 MG capsule Commonly known as:  COLACE Take 1 capsule (100 mg total) by mouth 2 (two) times daily.   ezetimibe 10 MG tablet Commonly known as:  ZETIA Take 1 tablet (10 mg total) by mouth daily.   ferrous sulfate 325 (65 FE) MG tablet Commonly known as:  FERROUSUL Take 1 tablet (325 mg total) by mouth 3 (three) times daily with meals.   HYDROcodone-acetaminophen 7.5-325 MG tablet Commonly known as:  NORCO Take 1-2 tablets by mouth every 4 (four) hours as needed for moderate pain.   insulin glargine 100 UNIT/ML injection Commonly known as:  LANTUS Inject 60 Units into the skin at bedtime.   loperamide 2 MG tablet Commonly known as:  IMODIUM A-D Take 2 mg by mouth 4 (four) times daily as needed for diarrhea or loose stools.   metFORMIN 1000 MG tablet Commonly known as:  GLUCOPHAGE Take 1,000 mg by mouth 2 (two) times daily with a meal.   methocarbamol 500 MG tablet Commonly known as:  ROBAXIN Take 1 tablet (500 mg total) by mouth every 6 (six) hours as needed for muscle spasms.   metoprolol succinate 25 MG 24 hr tablet Commonly known as:  TOPROL-XL Take 1 tablet (25 mg total) by mouth daily.    nitroGLYCERIN 0.4 MG SL tablet Commonly known as:  NITROSTAT Place 0.4 mg under the tongue every 5 (five) minutes as needed. As needed chest pain   omeprazole 20 MG capsule Commonly known as:  PRILOSEC Take 1 capsule (20 mg total) by mouth 2 (two) times daily before a meal. What changed:  when to take this   polyethylene glycol packet Commonly known as:  MIRALAX / GLYCOLAX Take 17 g by mouth 2 (two) times daily.   PRESERVISION AREDS 2 PO Take 1 capsule by mouth 2 (two) times daily.   ramipril 10 MG capsule Commonly known as:  ALTACE TAKE 1 CAPSULE BY MOUTH ONCE DAILY   RELION INSULIN SYRINGE 1ML/31G 31G X 5/16" 1 ML Misc Generic drug:  Insulin Syringe-Needle U-100 Use as directed   rivaroxaban 10 MG Tabs tablet Commonly known as:  XARELTO Take 1 tablet (10 mg total) by mouth daily for 14 days. Start taking on:  04/13/2017   rOPINIRole 1 MG tablet Commonly known as:  REQUIP Take 1 tablet (1 mg total) by mouth at bedtime.   sertraline 100 MG tablet Commonly known as:  ZOLOFT Take 1 tablet (100 mg total) by mouth daily.   verapamil 120 MG 24 hr capsule Commonly known as:  VERELAN PM Take 120 mg by mouth daily as needed (tachycardia).   verapamil 240 MG 24 hr capsule Commonly known as:  VERELAN PM Take 1 capsule (240 mg total) by mouth daily.   VITAMIN B-12 PO Take 1 tablet by mouth daily.            Discharge Care Instructions  (From admission, onward)        Start     Ordered   04/11/17 0000  Change dressing    Comments:  Maintain surgical dressing until follow up in the clinic. If the edges start to pull up, may reinforce with tape. If the dressing is no longer working, may remove and cover with gauze and tape, but must keep the area dry and clean.  Call with any questions or concerns.   04/11/17 0749       Signed:  West Pugh Pallavi Clifton   PA-C  04/11/2017, 7:50 AM

## 2017-04-11 NOTE — Care Management Obs Status (Signed)
[facility] NOTIFICATION   Patient Details  Name: [name] MRN: [id number] Date of Birth: [birth year]   Medicare Observation Status Notification Given:  Yes    Guadalupe Maple, RN 04/11/2017, 11:23 AM

## 2017-04-11 NOTE — Progress Notes (Signed)
Physical Therapy Treatment Patient Details Name: [name] MRN: [id number] DOB: 13-Aug-1940 Today's Date: 04/11/2017    History of Present Illness Pt admitted for L TKA. PMH: DM, fibromyalgia, arthritis, HTN.    PT Comments    Pt motivated and progressing with mobility.  Home therex reviewed with written instruction provided.   Follow Up Recommendations  Follow surgeon's recommendation for DC plan and follow-up therapies     Equipment Recommendations  None recommended by PT    Recommendations for Other Services OT consult     Precautions / Restrictions Precautions Precautions: Knee;Fall Restrictions Weight Bearing Restrictions: No Other Position/Activity Restrictions: WBAT    Mobility  Bed Mobility Overal bed mobility: Needs Assistance Bed Mobility: Supine to Sit     Supine to sit: Min assist     General bed mobility comments: Pt up in chair and requests back to same  Transfers Overall transfer level: Needs assistance Equipment used: Rolling walker (2 wheeled) Transfers: Sit to/from Stand Sit to Stand: Min guard         General transfer comment: cues for LE management and use of UEs to self assist  Ambulation/Gait Ambulation/Gait assistance: Min guard Ambulation Distance (Feet): 50 Feet Assistive device: Rolling walker (2 wheeled) Gait Pattern/deviations: Step-to pattern;Decreased step length - right;Decreased step length - left;Shuffle;Trunk flexed Gait velocity: decr Gait velocity interpretation: Below normal speed for age/gender General Gait Details: cues for sequence, posture and position from [employer]            Wheelchair Mobility    Modified Rankin (Stroke Patients Only)       Balance Overall balance assessment: Needs assistance Sitting-balance support: No upper extremity supported;Feet supported Sitting balance-[name]: Good     Standing balance support: Bilateral upper extremity supported Standing balance-Leahy  Scale: Poor                              Cognition Arousal/Alertness: Awake/alert Behavior During Therapy: WFL for tasks assessed/performed Overall Cognitive Status: Within Functional Limits for tasks assessed                                        Exercises Total Joint Exercises Ankle Circles/Pumps: AROM;Both;15 reps;Supine Quad Sets: AROM;Both;10 reps;Supine Heel Slides: AAROM;Left;15 reps;Supine Hip ABduction/ADduction: AAROM;Left;10 reps;Supine Straight Leg Raises: AAROM;Left;10 reps;Supine Knee Flexion: AAROM;AROM;Left;10 reps;Seated    General Comments        Pertinent Vitals/Pain Pain Assessment: 0-10 Pain Score: 6  Pain Location: L knee Pain Descriptors / Indicators: Sore;Guarding Pain Intervention(s): Limited activity within patient's tolerance;Monitored during session;Premedicated before session;Ice applied    Home Living Family/patient expects to be discharged to:: Private residence Living Arrangements: Alone Available Help at Discharge: Family;Available 24 hours/day Type of Home: House Home Access: Ramped entrance   Home Layout: One level Home Equipment: Clinical cytogeneticist - 2 wheels;Bedside commode;Hand held shower head;Cane - single point      Prior Function Level of Independence: Independent with assistive device(s)      Comments: used either the walker or the cane, many falls   PT Goals (current goals can now be found in the care plan section) Acute Rehab PT Goals Patient Stated Goal: be able to get out and walk again PT Goal Formulation: With patient Time For Goal Achievement: 04/18/17 Potential to Achieve Goals: Good Progress towards PT goals: Progressing toward  goals    Frequency    7X/week      PT Plan Current plan remains appropriate    Co-evaluation              AM-PAC PT "6 Clicks" Daily Activity  Outcome Measure  Difficulty turning over in bed (including adjusting bedclothes, sheets and  blankets)?: Unable Difficulty moving from lying on back to sitting on the side of the bed? : Unable Difficulty sitting down on and standing up from a chair with arms (e.g., wheelchair, bedside commode, etc,.)?: Unable Help needed moving to and from a bed to chair (including a wheelchair)?: A Little Help needed walking in hospital room?: A Little Help needed climbing 3-5 steps with a railing? : A Lot 6 Click Score: 11    End of Session Equipment Utilized During Treatment: Gait belt Activity Tolerance: Patient tolerated treatment well;Patient limited by fatigue Patient left: in chair;with call bell/phone within reach;with family/visitor present Nurse Communication: Mobility status PT Visit Diagnosis: Difficulty in walking, not elsewhere classified (R26.2)     Time: 2518-9842 PT Time Calculation (min) (ACUTE ONLY): 31 min  Charges:  $Gait Training: 8-22 mins $Therapeutic Exercise: 8-22 mins                    G Codes:       [phone]    Sereen Schaff 04/11/2017, 1:28 PM

## 2017-04-11 NOTE — Evaluation (Signed)
Physical Therapy Evaluation Patient Details Name: [name] MRN: [id number] DOB: [birth year] Today's Date: 04/11/2017   History of Present Illness  Pt admitted for L TKA. PMH: DM, fibromyalgia, arthritis, HTN.  Clinical Impression  Pt s/p L TKR and presents with decreased L LE strength/ROM and post op pain limiting functional mobility.  Pt should progress to dc home with assist of family.    Follow Up Recommendations Follow surgeon's recommendation for DC plan and follow-up therapies    Equipment Recommendations  None recommended by PT    Recommendations for Other Services OT consult     Precautions / Restrictions Precautions Precautions: Knee;Fall Restrictions Weight Bearing Restrictions: No Other Position/Activity Restrictions: WBAT      Mobility  Bed Mobility Overal bed mobility: Needs Assistance Bed Mobility: Supine to Sit     Supine to sit: Min assist     General bed mobility comments: cues for sequence and use of R LE to self assist  Transfers Overall transfer level: Needs assistance Equipment used: Rolling walker (2 wheeled) Transfers: Sit to/from Stand Sit to Stand: Min assist         General transfer comment: cues for technique, min assist to rise and steady  Ambulation/Gait Ambulation/Gait assistance: Min assist Ambulation Distance (Feet): 74 Feet Assistive device: Rolling walker (2 wheeled) Gait Pattern/deviations: Step-to pattern;Decreased step length - right;Decreased step length - left;Shuffle;Trunk flexed Gait velocity: decr Gait velocity interpretation: Below normal speed for age/gender General Gait Details: cues for sequence, posture and position from [employer]            Wheelchair Mobility    Modified Rankin (Stroke Patients Only)       Balance Overall balance assessment: Needs assistance Sitting-balance support: No upper extremity supported;Feet supported Sitting balance-[name]: Good     Standing balance  support: Bilateral upper extremity supported Standing balance-[name]: Poor                               Pertinent Vitals/Pain Pain Assessment: 0-10 Pain Score: 5  Pain Location: L knee Pain Descriptors / Indicators: Sore;Guarding Pain Intervention(s): Limited activity within patient's tolerance;Monitored during session;Premedicated before session;Ice applied    Home Living Family/patient expects to be discharged to:: Private residence Living Arrangements: Alone Available Help at Discharge: Family;Available 24 hours/day Type of Home: House Home Access: Ramped entrance     Home Layout: One level Home Equipment: Clinical cytogeneticist - 2 wheels;Bedside commode;Hand held shower head;Cane - single point      Prior Function Level of Independence: Independent with assistive device(s)         Comments: used either the walker or the cane, many falls     Hand Dominance   Dominant Hand: Right    Extremity/Trunk Assessment   Upper Extremity Assessment Upper Extremity Assessment: Defer to OT evaluation RUE Deficits / Details: hx of RCR    Lower Extremity Assessment Lower Extremity Assessment: LLE deficits/detail LLE Deficits / Details: 3-/5 quads with AAROM at knee -10 - 90    Cervical / Trunk Assessment Cervical / Trunk Assessment: Other exceptions Cervical / Trunk Exceptions: spinal stenosis  Communication   Communication: No difficulties  Cognition Arousal/Alertness: Awake/alert Behavior During Therapy: WFL for tasks assessed/performed Overall Cognitive Status: Within Functional Limits for tasks assessed  General Comments      Exercises Total Joint Exercises Ankle Circles/Pumps: AROM;Both;15 reps;Supine Quad Sets: AROM;Both;10 reps;Supine Heel Slides: AAROM;Left;15 reps;Supine Straight Leg Raises: AAROM;Left;10 reps;Supine   Assessment/Plan    PT Assessment Patient needs continued PT  services  PT Problem List Decreased strength;Decreased range of motion;Decreased activity tolerance;Decreased balance;Decreased mobility;Decreased knowledge of use of DME;Pain       PT Treatment Interventions DME instruction;Gait training;Stair training;Functional mobility training;Therapeutic activities;Therapeutic exercise;Patient/family education    PT Goals (Current goals can be found in the Care Plan section)  Acute Rehab PT Goals Patient Stated Goal: be able to get out and walk again PT Goal Formulation: With patient Time For Goal Achievement: 04/18/17 Potential to Achieve Goals: Good    Frequency 7X/week   Barriers to discharge        Co-evaluation               AM-PAC PT "6 Clicks" Daily Activity  Outcome Measure Difficulty turning over in bed (including adjusting bedclothes, sheets and blankets)?: Unable Difficulty moving from lying on back to sitting on the side of the bed? : Unable Difficulty sitting down on and standing up from a chair with arms (e.g., wheelchair, bedside commode, etc,.)?: Unable Help needed moving to and from a bed to chair (including a wheelchair)?: A Little Help needed walking in hospital room?: A Little Help needed climbing 3-5 steps with a railing? : A Lot 6 Click Score: 11    End of Session Equipment Utilized During Treatment: Gait belt Activity Tolerance: Patient tolerated treatment well;Patient limited by fatigue Patient left: in chair;with call bell/phone within reach;with family/visitor present Nurse Communication: Mobility status PT Visit Diagnosis: Difficulty in walking, not elsewhere classified (R26.2)    Time: 9163-8466 PT Time Calculation (min) (ACUTE ONLY): 42 min   Charges:   PT Evaluation $PT Eval Low Complexity: 1 Low PT Treatments $Gait Training: 8-22 mins $Therapeutic Exercise: 8-22 mins   PT G Codes:        Pg [phone]   Yosiel Thieme 04/11/2017, 11:44 AM

## 2017-04-16 ENCOUNTER — Ambulatory Visit: Payer: PPO | Attending: Orthopedic Surgery | Admitting: Physical Therapy

## 2017-04-16 ENCOUNTER — Other Ambulatory Visit: Payer: Self-pay

## 2017-04-16 VITALS — BP 146/75

## 2017-04-16 DIAGNOSIS — G8929 Other chronic pain: Secondary | ICD-10-CM | POA: Diagnosis not present

## 2017-04-16 DIAGNOSIS — R2681 Unsteadiness on feet: Secondary | ICD-10-CM | POA: Insufficient documentation

## 2017-04-16 DIAGNOSIS — R600 Localized edema: Secondary | ICD-10-CM | POA: Insufficient documentation

## 2017-04-16 DIAGNOSIS — M25562 Pain in left knee: Secondary | ICD-10-CM | POA: Insufficient documentation

## 2017-04-16 DIAGNOSIS — M6281 Muscle weakness (generalized): Secondary | ICD-10-CM | POA: Diagnosis not present

## 2017-04-16 DIAGNOSIS — M25662 Stiffness of left knee, not elsewhere classified: Secondary | ICD-10-CM

## 2017-04-16 NOTE — Therapy (Signed)
[facility] [place], [place], [address] Phone: [phone]   Fax:  [phone]  Physical Therapy Evaluation  Patient Details  Name: [name] MRN: [id number] Date of Birth: 1940/03/06 Referring Provider: Paralee Cancel MD   Encounter Date: 04/16/2017  PT End of Session - 04/16/17 1337    Visit Number  1    Number of Visits  12    Date for PT Re-Evaluation  05/14/17    Authorization Type  FOTO AT LEAST EVERY 5TH VISIT.    PT Start Time  1256    PT Stop Time  1340    PT Time Calculation (min)  44 min    Activity Tolerance  Patient tolerated treatment well C/o nausea today.    Behavior During Therapy  [facility] for tasks assessed/performed       Past Medical History:  Diagnosis Date  . Anxiety   . Arthritis   . Complication of anesthesia   . Depression   . Dysrhythmia    Paroysmal Atrial Tachycardia  . Essential hypertension   . Fibromyalgia   . GERD (gastroesophageal reflux disease)   . Hyperlipemia   . PONV (postoperative nausea and vomiting)   . PSVT (paroxysmal supraventricular tachycardia) ([place])   . Reflux   . Restless leg syndrome   . Type 2 diabetes mellitus ([place])     Past Surgical History:  Procedure Laterality Date  . ABDOMINAL HYSTERECTOMY    . ANKLE SURGERY    . BACK SURGERY     T12-L1 fusion, Anterior Cervical fusion  . CARDIAC CATHETERIZATION N/A 02/16/2015   Procedure: Left Heart Cath and Coronary Angiography;  Surgeon: Sherren Mocha, MD;  Location: [facility];  Service: Cardiovascular;  Laterality: N/A;  . CHOLECYSTECTOMY    . COLONOSCOPY    . ESOPHAGOGASTRODUODENOSCOPY  12/21/2010   Procedure: ESOPHAGOGASTRODUODENOSCOPY (EGD);  Surgeon: Landry Dyke, MD;  Location: [facility];  Service: Endoscopy;  Laterality: N/A;  . EXCISION MORTON'S NEUROMA    . FRACTURE SURGERY    . KNEE SURGERY     x 3  . NECK SURGERY    . ROTATOR CUFF REPAIR    . TONSILLECTOMY    . TOTAL KNEE  ARTHROPLASTY Left 04/10/2017   Procedure: LEFT TOTAL KNEE ARTHROPLASTY;  Surgeon: Paralee Cancel, MD;  Location: [facility];  Service: Orthopedics;  Laterality: Left;  70 mins  . UPPER GASTROINTESTINAL ENDOSCOPY    . WRIST SURGERY      Vitals:   04/16/17 1316  BP: (!) 146/75     Subjective Assessment - 04/16/17 1321    Subjective  The patient underwent a left total knee replacement on 04/10/17.  She feels nauseous today and would like not exercise today.  Her daughter brought her in and states they have faithfully been doing the exercises threes times a day.  Her at rest pain-level is alow 2/10 today and much higher with range of motion activites.    Patient is accompained by:  Family member Daughter.    Patient Stated Goals  I would like to be more mobile, walk better and travel.    Currently in Pain?  Yes    Pain Score  2     Pain Location  Knee    Pain Orientation  Left    Pain Descriptors / Indicators  Aching;Throbbing    Pain Type  Surgical pain    Pain Onset  1 to 4 weeks ago    Pain Frequency  Constant    Aggravating Factors   See above.    Pain Relieving Factors  See above.         [facility] PT Assessment - 04/16/17 0001      Assessment   Medical Diagnosis  Left total knee replacement.    Referring Provider  Paralee Cancel MD    Onset Date/Surgical Date  -- 04/10/17(surgery date).      Precautions   Precautions  Fall      Restrictions   Weight Bearing Restrictions  No      Balance Screen   Has the patient fallen in the past 6 months  Yes    How many times?  -- Multiple.    Has the patient had a decrease in activity level because of a fear of falling?   Yes    Is the patient reluctant to leave their home because of a fear of falling?   Yes      [place] residence      Prior Function   Level of Independence  Independent      Observation/Other Assessments   Observations  Aquacel covering anterior left knee surgical site.    Focus  on Therapeutic Outcomes (FOTO)   90% limitation.      ROM / Strength   AROM / PROM / Strength  AROM;Strength      AROM   Overall AROM Comments  -15 degrees of left knee extension in supine and -10 degrees passively with active flexion to 90 dgerees and 95 degrees passive.      Strength   Overall Strength Comments  Left hip flexion= 4/5; abduction= 4/5 and left knee 4/5.      Palpation   Palpation comment  Very tender to palpation diffusely around left anterior knee.      Special Tests   Other special tests  Circumferential measurement at mid-patellar region is 6 cms > on left than right.      Ambulation/Gait   Gait Comments  Slow and antalgic gait pattern with FWW.             Objective measurements completed on examination: See above findings.      [place] Adult PT Treatment/Exercise - 04/16/17 0001      Modalities   Modalities  Vasopneumatic      Vasopneumatic   Number Minutes Vasopneumatic   15 minutes    Vasopnuematic Location   -- Left knee.    Vasopneumatic Pressure  Low               PT Short Term Goals - 04/16/17 1447      PT SHORT TERM GOAL #1   Title  STG's=LTG's.        PT Long Term Goals - 04/16/17 1447      PT LONG TERM GOAL #1   Title  Independent with a HEP.    Time  4    Period  Weeks    Status  New      PT LONG TERM GOAL #2   Title  Full active left knee extension in order to normalize gait.    Time  4    Period  Weeks    Status  New      PT LONG TERM GOAL #3   Title  Active knee flexion to 115 degrees+ so the patient can perform functional tasks and do so with pain not > 2-3/10.    Time  4    Period  Weeks    Status  New      PT LONG TERM GOAL #4   Title  Increase left knee strength to a solid 4+/5 to provide good stability for accomplishment of functional activities.    Time  4    Period  Weeks    Status  New      PT LONG TERM GOAL #5   Title  Perform a reciprocating stair gait with one railing with pain not >  2-3/10.    Time  4    Period  Weeks    Status  New      PT LONG TERM GOAL #6   Title  Decrease edema to within 3 cms of non-affected side to assist with pain reduction and range of motion gains.    Time  4    Period  Weeks    Status  New             Plan - 04/16/17 1442    Clinical Impression Statement  The patient is s/p left total knee replacement performed on 04/10/17.  As expected she has a loss of left knee range of motion and strength.  Her functional mobility is currently impaired and she has notable left knee edema.  She is expected to do very well with a progression through a total knee replacement protocol.    History and Personal Factors relevant to plan of care:  Multiple co-morbidities.    Clinical Presentation  Stable    Clinical Presentation due to:  Good surgical outcome.    Clinical Decision Making  Low    Rehab Potential  Excellent    PT Frequency  3x / week    PT Duration  4 weeks    PT Treatment/Interventions  ADLs/Self Care Home Management;Cryotherapy;Electrical Stimulation;Gait training;Stair training;Functional mobility training;Therapeutic activities;Therapeutic exercise;Neuromuscular re-education;Patient/family education;Passive range of motion;Manual techniques;Vasopneumatic Device    PT Next Visit Plan  Total knee replacment protocol.  Electrical stimulation and vasopneumatic.    Consulted and Agree with Plan of Care  Patient       Patient will benefit from skilled therapeutic intervention in order to improve the following deficits and impairments:  Abnormal gait, Decreased activity tolerance, Decreased mobility, Decreased range of motion, Decreased strength, Increased edema, Pain  Visit Diagnosis: Chronic pain of left knee - Plan: PT plan of care cert/re-cert  Stiffness of left knee, not elsewhere classified - Plan: PT plan of care cert/re-cert  Localized edema - Plan: PT plan of care cert/re-cert     Problem List Patient Active Problem List    Diagnosis Date Noted  . S/P left TKA 04/10/2017  . Unstable gait 01/12/2017  . Generalized osteoarthritis of hand 01/12/2017  . RLS (restless legs syndrome) 11/09/2016  . GERD (gastroesophageal reflux disease) 07/13/2016  . Class 2 severe obesity due to excess calories with serious comorbidity and body mass index (BMI) of 36.0 to 36.9 in adult ([place]) 06/01/2016  . Vitamin D deficiency 06/01/2016  . Vitamin B12 deficiency 03/27/2016  . Chronic cough 03/16/2016  . Left foot pain 03/16/2016  . Demand ischemia ([place]) 02/14/2015  . Degenerative drusen 07/13/2013  . Type 2 diabetes mellitus without ophthalmic manifestations ([place]) 07/13/2013  . DM (diabetes mellitus) type II controlled, neurological manifestation ([place]) 05/07/2012  . Paroxysmal atrial tachycardia ([place]) 05/07/2012  . Essential hypertension, benign 05/07/2012  . Depression 05/07/2012  . Hyperlipidemia 05/07/2012  . Fibromyalgia 05/07/2012    Srinika Delone, [place] MPT 04/16/2017, 3:13  PM  [facility] [place], [place], [address] Phone: [phone]   Fax:  [phone]  Name: TAMYAH CUTBIRTH MRN: [id number] Date of Birth: [birth year]

## 2017-04-17 ENCOUNTER — Ambulatory Visit: Payer: PPO | Admitting: Physical Therapy

## 2017-04-17 DIAGNOSIS — M25662 Stiffness of left knee, not elsewhere classified: Secondary | ICD-10-CM

## 2017-04-17 DIAGNOSIS — R600 Localized edema: Secondary | ICD-10-CM

## 2017-04-17 DIAGNOSIS — M25562 Pain in left knee: Secondary | ICD-10-CM

## 2017-04-17 DIAGNOSIS — G8929 Other chronic pain: Secondary | ICD-10-CM

## 2017-04-17 NOTE — Therapy (Signed)
[facility] [place], [place], [address] Phone: [phone]   Fax:  [phone]  Physical Therapy Treatment  Patient Details  Name: [name] MRN: [id number] Date of Birth: 15-Dec-1940 Referring Provider: Paralee Cancel MD   Encounter Date: 04/17/2017  PT End of Session - 04/17/17 1726    Visit Number  2    Number of Visits  12    Date for PT Re-Evaluation  05/14/17    Authorization Type  FOTO AT LEAST EVERY 5TH VISIT.    PT Start Time  0400    PT Stop Time  0456    PT Time Calculation (min)  56 min    Activity Tolerance  Patient tolerated treatment well;Other (comment) Some c/o mild nausea.       Past Medical History:  Diagnosis Date  . Anxiety   . Arthritis   . Complication of anesthesia   . Depression   . Dysrhythmia    Paroysmal Atrial Tachycardia  . Essential hypertension   . Fibromyalgia   . GERD (gastroesophageal reflux disease)   . Hyperlipemia   . PONV (postoperative nausea and vomiting)   . PSVT (paroxysmal supraventricular tachycardia) ([place])   . Reflux   . Restless leg syndrome   . Type 2 diabetes mellitus ([place])     Past Surgical History:  Procedure Laterality Date  . ABDOMINAL HYSTERECTOMY    . ANKLE SURGERY    . BACK SURGERY     T12-L1 fusion, Anterior Cervical fusion  . CARDIAC CATHETERIZATION N/A 02/16/2015   Procedure: Left Heart Cath and Coronary Angiography;  Surgeon: Sherren Mocha, MD;  Location: [facility];  Service: Cardiovascular;  Laterality: N/A;  . CHOLECYSTECTOMY    . COLONOSCOPY    . ESOPHAGOGASTRODUODENOSCOPY  12/21/2010   Procedure: ESOPHAGOGASTRODUODENOSCOPY (EGD);  Surgeon: Landry Dyke, MD;  Location: [facility];  Service: Endoscopy;  Laterality: N/A;  . EXCISION MORTON'S NEUROMA    . FRACTURE SURGERY    . KNEE SURGERY     x 3  . NECK SURGERY    . ROTATOR CUFF REPAIR    . TONSILLECTOMY    . TOTAL KNEE ARTHROPLASTY Left 04/10/2017   Procedure: LEFT TOTAL  KNEE ARTHROPLASTY;  Surgeon: Paralee Cancel, MD;  Location: [facility];  Service: Orthopedics;  Laterality: Left;  70 mins  . UPPER GASTROINTESTINAL ENDOSCOPY    . WRIST SURGERY      There were no vitals filed for this visit.  Subjective Assessment - 04/17/17 1737    Subjective  I'm okay.  Still feel sick.    Patient Stated Goals  I would like to be more mobile, walk better and travel.    Currently in Pain?  Yes    Pain Score  3     Pain Location  Knee    Pain Orientation  Left    Pain Descriptors / Indicators  Aching;Throbbing    Pain Type  Surgical pain    Pain Onset  1 to 4 weeks ago                      [facility] Adult PT Treatment/Exercise - 04/17/17 0001      Exercises   Exercises  Knee/Hip      Knee/Hip Exercises: Aerobic   Nustep  Level 1 x 20 minutes monitored moving seat forward x 3 to increase flexion.      Vasopneumatic   Number Minutes Vasopneumatic   20 minutes  Vasopnuematic Location   -- Left knee.    Vasopneumatic Pressure  Medium      Manual Therapy   Manual Therapy  Passive ROM    Manual therapy comments  In supine:  PROM to patient's left knee into flexion and extension x 5 minutes.               PT Short Term Goals - 04/16/17 1447      PT SHORT TERM GOAL #1   Title  STG's=LTG's.        PT Long Term Goals - 04/16/17 1447      PT LONG TERM GOAL #1   Title  Independent with a HEP.    Time  4    Period  Weeks    Status  New      PT LONG TERM GOAL #2   Title  Full active left knee extension in order to normalize gait.    Time  4    Period  Weeks    Status  New      PT LONG TERM GOAL #3   Title  Active knee flexion to 115 degrees+ so the patient can perform functional tasks and do so with pain not > 2-3/10.    Time  4    Period  Weeks    Status  New      PT LONG TERM GOAL #4   Title  Increase left knee strength to a solid 4+/5 to provide good stability for accomplishment of functional activities.    Time  4    Period   Weeks    Status  New      PT LONG TERM GOAL #5   Title  Perform a reciprocating stair gait with one railing with pain not > 2-3/10.    Time  4    Period  Weeks    Status  New      PT LONG TERM GOAL #6   Title  Decrease edema to within 3 cms of non-affected side to assist with pain reduction and range of motion gains.    Time  4    Period  Weeks    Status  New            Plan - 04/17/17 1745    Clinical Impression Statement  Patient did well today but after 5 minutes of PROM she asked to stop as she was beginning to feel nauseous.  Patient nearly achieved full passive extension in supine today.    Consulted and Agree with Plan of Care  Patient       Patient will benefit from skilled therapeutic intervention in order to improve the following deficits and impairments:  Abnormal gait, Decreased activity tolerance, Decreased mobility, Decreased range of motion, Decreased strength, Increased edema, Pain  Visit Diagnosis: Stiffness of left knee, not elsewhere classified  Chronic pain of left knee  Localized edema     Problem List Patient Active Problem List   Diagnosis Date Noted  . S/P left TKA 04/10/2017  . Unstable gait 01/12/2017  . Generalized osteoarthritis of hand 01/12/2017  . RLS (restless legs syndrome) 11/09/2016  . GERD (gastroesophageal reflux disease) 07/13/2016  . Class 2 severe obesity due to excess calories with serious comorbidity and body mass index (BMI) of 36.0 to 36.9 in adult ([place]) 06/01/2016  . Vitamin D deficiency 06/01/2016  . Vitamin B12 deficiency 03/27/2016  . Chronic cough 03/16/2016  . Left foot pain 03/16/2016  . Demand ischemia (  [place]) 02/14/2015  . Degenerative drusen 07/13/2013  . Type 2 diabetes mellitus without ophthalmic manifestations ([place]) 07/13/2013  . DM (diabetes mellitus) type II controlled, neurological manifestation ([place]) 05/07/2012  . Paroxysmal atrial tachycardia ([place]) 05/07/2012  . Essential hypertension, benign  05/07/2012  . Depression 05/07/2012  . Hyperlipidemia 05/07/2012  . Fibromyalgia 05/07/2012    Juanda Luba, [place] MPT 04/17/2017, 5:49 PM  [facility] [address] [place], [place], [address] Phone: [phone]   Fax:  [phone]  Name: [name] MRN: [id number] Date of Birth: [birth year]

## 2017-04-19 ENCOUNTER — Encounter: Payer: Self-pay | Admitting: Physical Therapy

## 2017-04-19 ENCOUNTER — Ambulatory Visit: Payer: PPO | Admitting: Physical Therapy

## 2017-04-19 DIAGNOSIS — R600 Localized edema: Secondary | ICD-10-CM

## 2017-04-19 DIAGNOSIS — M25562 Pain in left knee: Principal | ICD-10-CM

## 2017-04-19 DIAGNOSIS — M25662 Stiffness of left knee, not elsewhere classified: Secondary | ICD-10-CM

## 2017-04-19 DIAGNOSIS — G8929 Other chronic pain: Secondary | ICD-10-CM

## 2017-04-19 NOTE — Therapy (Signed)
[facility] [place], [place], [address] Phone: [phone]   Fax:  [phone]  Physical Therapy Treatment  Patient Details  Name: [name] MRN: [id number] Date of Birth: 1940/08/01 Referring Provider: Paralee Cancel MD   Encounter Date: 04/19/2017  PT End of Session - 04/19/17 1438    Visit Number  3    Number of Visits  12    Date for PT Re-Evaluation  05/14/17    Authorization Type  FOTO AT LEAST EVERY 5TH VISIT.    Authorization Time Period  To MD 03-29-17    PT Start Time  1438    PT Stop Time  1520    PT Time Calculation (min)  42 min    Activity Tolerance  Other (comment) limted by nausea    Behavior During Therapy  WFL for tasks assessed/performed       Past Medical History:  Diagnosis Date  . Anxiety   . Arthritis   . Complication of anesthesia   . Depression   . Dysrhythmia    Paroysmal Atrial Tachycardia  . Essential hypertension   . Fibromyalgia   . GERD (gastroesophageal reflux disease)   . Hyperlipemia   . PONV (postoperative nausea and vomiting)   . PSVT (paroxysmal supraventricular tachycardia) ([place])   . Reflux   . Restless leg syndrome   . Type 2 diabetes mellitus ([place])     Past Surgical History:  Procedure Laterality Date  . ABDOMINAL HYSTERECTOMY    . ANKLE SURGERY    . BACK SURGERY     T12-L1 fusion, Anterior Cervical fusion  . CARDIAC CATHETERIZATION N/A 02/16/2015   Procedure: Left Heart Cath and Coronary Angiography;  Surgeon: Sherren Mocha, MD;  Location: [facility];  Service: Cardiovascular;  Laterality: N/A;  . CHOLECYSTECTOMY    . COLONOSCOPY    . ESOPHAGOGASTRODUODENOSCOPY  12/21/2010   Procedure: ESOPHAGOGASTRODUODENOSCOPY (EGD);  Surgeon: Landry Dyke, MD;  Location: [facility];  Service: Endoscopy;  Laterality: N/A;  . EXCISION MORTON'S NEUROMA    . FRACTURE SURGERY    . KNEE SURGERY     x 3  . NECK SURGERY    . ROTATOR CUFF REPAIR    . TONSILLECTOMY     . TOTAL KNEE ARTHROPLASTY Left 04/10/2017   Procedure: LEFT TOTAL KNEE ARTHROPLASTY;  Surgeon: Paralee Cancel, MD;  [facility];  Service: Orthopedics;  Laterality: Left;  70 mins  . UPPER GASTROINTESTINAL ENDOSCOPY    . WRIST SURGERY      There were no vitals filed for this visit.  Subjective Assessment - 04/19/17 1437    Subjective  Reports that she still has some sick feeling.     Pertinent History  Neck, back, right RTC, left wrist and left ankle surgery.  Athritis and DM.    Patient Stated Goals  I would like to be more mobile, walk better and travel.    Currently in Pain?  Yes    Pain Score  5     Pain Location  Knee    Pain Orientation  Left    Pain Descriptors / Indicators  Aching    Pain Type  Surgical pain    Pain Onset  1 to 4 weeks ago    Pain Frequency  Constant         [facility] PT Assessment - 04/19/17 0001      Assessment   Medical Diagnosis  Left total knee replacement.    Onset Date/Surgical  Date  04/10/17    Next MD Visit  04/27/2017      Precautions   Precautions  Fall      Restrictions   Weight Bearing Restrictions  No      ROM / Strength   AROM / PROM / Strength  AROM      AROM   Overall AROM   Within functional limits for tasks performed    AROM Assessment Site  Knee    Right/Left Knee  Left    Left Knee Flexion  105                  [facility] Adult PT Treatment/Exercise - 04/19/17 0001      Knee/Hip Exercises: Aerobic   Nustep  L1, seat 9-7 x15 min      Modalities   Modalities  Vasopneumatic      Vasopneumatic   Number Minutes Vasopneumatic   15 minutes    Vasopnuematic Location   Knee    Vasopneumatic Pressure  Low    Vasopneumatic Temperature   50      Manual Therapy   Manual Therapy  Passive ROM    Passive ROM  PROM of L knee into flexion, extension in sitting and supine to improve ROM stopped secondary to nausea               PT Short Term Goals - 04/16/17 1447      PT SHORT TERM GOAL #1   Title   STG's=LTG's.        PT Long Term Goals - 04/16/17 1447      PT LONG TERM GOAL #1   Title  Independent with a HEP.    Time  4    Period  Weeks    Status  New      PT LONG TERM GOAL #2   Title  Full active left knee extension in order to normalize gait.    Time  4    Period  Weeks    Status  New      PT LONG TERM GOAL #3   Title  Active knee flexion to 115 degrees+ so the patient can perform functional tasks and do so with pain not > 2-3/10.    Time  4    Period  Weeks    Status  New      PT LONG TERM GOAL #4   Title  Increase left knee strength to a solid 4+/5 to provide good stability for accomplishment of functional activities.    Time  4    Period  Weeks    Status  New      PT LONG TERM GOAL #5   Title  Perform a reciprocating stair gait with one railing with pain not > 2-3/10.    Time  4    Period  Weeks    Status  New      PT LONG TERM GOAL #6   Title  Decrease edema to within 3 cms of non-affected side to assist with pain reduction and range of motion gains.    Time  4    Period  Weeks    Status  New            Plan - 04/19/17 1500    Clinical Impression Statement  Patient tolerated today's treatment fairly well today as she reports she is still battling pain as well as nausea. Initially PROM completed in sitting and then supine to improve ROM.  AROM of L knee measured as 105 deg in supine. Patient experienced greater discomfort with PROM of L knee into extension and PROM was stopped secondary to nauseated sensation. Patient was sat up and fanned until symptoms subsided. Patient tolerated Nustep very well with no complaints and feeling as if the L knee was loosening more. Ecchymosis observed throughout posterior LLE and medial L knee with purple to yellow coloration. Aquacell still in place over the L knee incision. Normal vasopneuamtic response noted following removal of the modalities.     Rehab Potential  Excellent    Clinical Impairments Affecting Rehab  Potential  [place].    PT Frequency  3x / week    PT Duration  4 weeks    PT Treatment/Interventions  ADLs/Self Care Home Management;Cryotherapy;Electrical Stimulation;Gait training;Stair training;Functional mobility training;Therapeutic activities;Therapeutic exercise;Neuromuscular re-education;Patient/family education;Passive range of motion;Manual techniques;Vasopneumatic Device    PT Next Visit Plan  Total knee replacment protocol.  Electrical stimulation and vasopneumatic.    PT Home Exercise Plan  supine marching, bridge, SDLY clam, QL stretch in supine, sit to stand (hold bridge if painful)      1 visit left.. More visits?    Consulted and Agree with Plan of Care  Patient       Patient will benefit from skilled therapeutic intervention in order to improve the following deficits and impairments:  Abnormal gait, Decreased activity tolerance, Decreased mobility, Decreased range of motion, Decreased strength, Increased edema, Pain  Visit Diagnosis: Chronic pain of left knee  Stiffness of left knee, not elsewhere classified  Localized edema     Problem List Patient Active Problem List   Diagnosis Date Noted  . S/P left TKA 04/10/2017  . Unstable gait 01/12/2017  . Generalized osteoarthritis of hand 01/12/2017  . RLS (restless legs syndrome) 11/09/2016  . GERD (gastroesophageal reflux disease) 07/13/2016  . Class 2 severe obesity due to excess calories with serious comorbidity and body mass index (BMI) of 36.0 to 36.9 in adult ([place]) 06/01/2016  . Vitamin D deficiency 06/01/2016  . Vitamin B12 deficiency 03/27/2016  . Chronic cough 03/16/2016  . Left foot pain 03/16/2016  . Demand ischemia ([place]) 02/14/2015  . Degenerative drusen 07/13/2013  . Type 2 diabetes mellitus without ophthalmic manifestations ([place]) 07/13/2013  . DM (diabetes mellitus) type II controlled, neurological manifestation ([place]) 05/07/2012  . Paroxysmal atrial tachycardia ([place]) 05/07/2012  . Essential  hypertension, benign 05/07/2012  . Depression 05/07/2012  . Hyperlipidemia 05/07/2012  . Fibromyalgia 05/07/2012    Standley Brooking, PTA 04/19/2017, 4:09 PM  [facility] [address] [place], [place], [address] Phone: [phone]   Fax:  [phone]  Name: [name] MRN: [id number] Date of Birth: 1940/05/30

## 2017-04-23 ENCOUNTER — Telehealth: Payer: Self-pay | Admitting: Family Medicine

## 2017-04-23 ENCOUNTER — Encounter: Payer: PPO | Admitting: Physical Therapy

## 2017-04-23 DIAGNOSIS — F330 Major depressive disorder, recurrent, mild: Secondary | ICD-10-CM

## 2017-04-23 MED ORDER — SERTRALINE HCL 100 MG PO TABS: 100.0000 mg | ORAL_TABLET | Freq: Every day | ORAL | 4 refills | Status: DC

## 2017-04-23 NOTE — Telephone Encounter (Signed)
Last office visit 04/09/17 PCP: Dr. [name] Pharmacy: [facility] in [place] for Zoloft refilled per protocol

## 2017-04-23 NOTE — Telephone Encounter (Signed)
Copied from [place] [id number]. Topic: Quick Communication - Rx Refill/Question >> Apr 23, 2017  9:52 AM Lolita Rieger, RMA wrote: Medication: zoloft 100 mg   Has the patient contacted their pharmacy? yes   (Agent: If no, request that the patient contact the pharmacy for the refill.)   Preferred Pharmacy (with phone number or street name): [facility] in [place]   Agent: Please be advised that RX refills may take up to 3 business days. We ask that you follow-up with your pharmacy.

## 2017-04-25 ENCOUNTER — Encounter: Payer: Self-pay | Admitting: Physical Therapy

## 2017-04-25 ENCOUNTER — Ambulatory Visit: Payer: PPO | Admitting: Physical Therapy

## 2017-04-25 DIAGNOSIS — G8929 Other chronic pain: Secondary | ICD-10-CM

## 2017-04-25 DIAGNOSIS — R600 Localized edema: Secondary | ICD-10-CM

## 2017-04-25 DIAGNOSIS — M25562 Pain in left knee: Secondary | ICD-10-CM | POA: Diagnosis not present

## 2017-04-25 DIAGNOSIS — M25662 Stiffness of left knee, not elsewhere classified: Secondary | ICD-10-CM

## 2017-04-25 NOTE — Therapy (Signed)
[facility] [place], [place], [address] Phone: [phone]   Fax:  [phone]  Physical Therapy Treatment  Patient Details  Name: [name] MRN: [id number] Date of Birth: [birth year] Referring Provider: Paralee Cancel MD   Encounter Date: 04/25/2017  PT End of Session - 04/25/17 1119    Visit Number  4    Number of Visits  12    Date for PT Re-Evaluation  05/14/17    Authorization Type  FOTO AT LEAST EVERY 5TH VISIT.    PT Start Time  1116    PT Stop Time  1206    PT Time Calculation (min)  50 min    Activity Tolerance  Patient tolerated treatment well    Behavior During Therapy  WFL for tasks assessed/performed       Past Medical History:  Diagnosis Date  . Anxiety   . Arthritis   . Complication of anesthesia   . Depression   . Dysrhythmia    Paroysmal Atrial Tachycardia  . Essential hypertension   . Fibromyalgia   . GERD (gastroesophageal reflux disease)   . Hyperlipemia   . PONV (postoperative nausea and vomiting)   . PSVT (paroxysmal supraventricular tachycardia) ([place])   . Reflux   . Restless leg syndrome   . Type 2 diabetes mellitus ([place])     Past Surgical History:  Procedure Laterality Date  . ABDOMINAL HYSTERECTOMY    . ANKLE SURGERY    . BACK SURGERY     T12-L1 fusion, Anterior Cervical fusion  . CARDIAC CATHETERIZATION N/A 02/16/2015   Procedure: Left Heart Cath and Coronary Angiography;  Surgeon: Sherren Mocha, MD;  Location: [facility];  Service: Cardiovascular;  Laterality: N/A;  . CHOLECYSTECTOMY    . COLONOSCOPY    . ESOPHAGOGASTRODUODENOSCOPY  12/21/2010   Procedure: ESOPHAGOGASTRODUODENOSCOPY (EGD);  Surgeon: Landry Dyke, MD;  Location: [facility];  Service: Endoscopy;  Laterality: N/A;  . EXCISION MORTON'S NEUROMA    . FRACTURE SURGERY    . KNEE SURGERY     x 3  . NECK SURGERY    . ROTATOR CUFF REPAIR    . TONSILLECTOMY    . TOTAL KNEE ARTHROPLASTY Left 04/10/2017    Procedure: LEFT TOTAL KNEE ARTHROPLASTY;  Surgeon: Paralee Cancel, MD;  Location: [facility];  Service: Orthopedics;  Laterality: Left;  70 mins  . UPPER GASTROINTESTINAL ENDOSCOPY    . WRIST SURGERY      There were no vitals filed for this visit.  Subjective Assessment - 04/25/17 1109    Subjective  Reports that she had the stomach bug and missed the last treatment. Reports that her bruising is almost gone but her pain is off the charts.     Patient is accompained by:  Family member [name]    Pertinent History  Neck, back, right RTC, left wrist and left ankle surgery.  Athritis and DM.    Patient Stated Goals  I would like to be more mobile, walk better and travel.    Currently in Pain?  Yes    Pain Score  -- "12, 15"    Pain Location  Knee    Pain Orientation  Left    Pain Descriptors / Indicators  Sore;Aching    Pain Type  Surgical pain    Pain Onset  1 to 4 weeks ago         [facility] PT Assessment - 04/25/17 0001      Assessment  Medical Diagnosis  Left total knee replacement.    Onset Date/Surgical Date  04/10/17    Next MD Visit  04/27/2017      Precautions   Precautions  Fall      Restrictions   Weight Bearing Restrictions  No      AROM   Overall AROM   Deficits    AROM Assessment Site  Knee    Right/Left Knee  Left    Left Knee Extension  -5    Left Knee Flexion  108                  [facility] Adult PT Treatment/Exercise - 04/25/17 0001      Knee/Hip Exercises: Aerobic   Nustep  L1, seat 8-7 x15 min      Knee/Hip Exercises: Standing   Hip Flexion  AROM;Left;10 reps;Knee bent    Forward Lunges  Left;15 reps;2 seconds    Hip Abduction  AROM;Left;10 reps;Knee straight    Rocker Board  2 minutes      Knee/Hip Exercises: Supine   Short Arc Quad Sets  AROM;Left;15 reps 3 sec hold      Modalities   Modalities  Vasopneumatic      Vasopneumatic   Number Minutes Vasopneumatic   15 minutes    Vasopnuematic Location   Knee    Vasopneumatic Pressure  Low     Vasopneumatic Temperature   34      Manual Therapy   Manual Therapy  Passive ROM    Passive ROM  PROM of L knee into flexion in supine but limited secondary to pain               PT Short Term Goals - 04/16/17 1447      PT SHORT TERM GOAL #1   Title  STG's=LTG's.        PT Long Term Goals - 04/16/17 1447      PT LONG TERM GOAL #1   Title  Independent with a HEP.    Time  4    Period  Weeks    Status  New      PT LONG TERM GOAL #2   Title  Full active left knee extension in order to normalize gait.    Time  4    Period  Weeks    Status  New      PT LONG TERM GOAL #3   Title  Active knee flexion to 115 degrees+ so the patient can perform functional tasks and do so with pain not > 2-3/10.    Time  4    Period  Weeks    Status  New      PT LONG TERM GOAL #4   Title  Increase left knee strength to a solid 4+/5 to provide good stability for accomplishment of functional activities.    Time  4    Period  Weeks    Status  New      PT LONG TERM GOAL #5   Title  Perform a reciprocating stair gait with one railing with pain not > 2-3/10.    Time  4    Period  Weeks    Status  New      PT LONG TERM GOAL #6   Title  Decrease edema to within 3 cms of non-affected side to assist with pain reduction and range of motion gains.    Time  4    Period  Weeks  Status  New            Plan - 04/25/17 1158    Clinical Impression Statement  Patient tolerated today's treatment fairly well as she arrived with increased pain. Patient guided through more standing exercises today which she tolerated fairly well. Patient also guided through initial SAQ with hold at end range for quad strengthening. Patient limited with PROM of L knee today due to pain but AROM measured as 5-108 deg in supine. Patient still ambulating with FWW at this time. Increased edema of the L knee observed with minimal redness along lateral L knee. Bruising along medial LLE now yellowing at this time.  Normal vasopnuematic response noted following end of treatment.    Rehab Potential  Excellent    Clinical Impairments Affecting Rehab Potential  [place].    PT Frequency  3x / week    PT Duration  4 weeks    PT Treatment/Interventions  ADLs/Self Care Home Management;Cryotherapy;Electrical Stimulation;Gait training;Stair training;Functional mobility training;Therapeutic activities;Therapeutic exercise;Neuromuscular re-education;Patient/family education;Passive range of motion;Manual techniques;Vasopneumatic Device    PT Next Visit Plan  Total knee replacment protocol.  Electrical stimulation and vasopneumatic.    Consulted and Agree with Plan of Care  Patient       Patient will benefit from skilled therapeutic intervention in order to improve the following deficits and impairments:  Abnormal gait, Decreased activity tolerance, Decreased mobility, Decreased range of motion, Decreased strength, Increased edema, Pain  Visit Diagnosis: Chronic pain of left knee  Stiffness of left knee, not elsewhere classified  Localized edema     Problem List Patient Active Problem List   Diagnosis Date Noted  . S/P left TKA 04/10/2017  . Unstable gait 01/12/2017  . Generalized osteoarthritis of hand 01/12/2017  . RLS (restless legs syndrome) 11/09/2016  . GERD (gastroesophageal reflux disease) 07/13/2016  . Class 2 severe obesity due to excess calories with serious comorbidity and body mass index (BMI) of 36.0 to 36.9 in adult ([place]) 06/01/2016  . Vitamin D deficiency 06/01/2016  . Vitamin B12 deficiency 03/27/2016  . Chronic cough 03/16/2016  . Left foot pain 03/16/2016  . Demand ischemia ([place]) 02/14/2015  . Degenerative drusen 07/13/2013  . Type 2 diabetes mellitus without ophthalmic manifestations ([place]) 07/13/2013  . DM (diabetes mellitus) type II controlled, neurological manifestation ([place]) 05/07/2012  . Paroxysmal atrial tachycardia ([place]) 05/07/2012  . Essential hypertension, benign  05/07/2012  . Depression 05/07/2012  . Hyperlipidemia 05/07/2012  . Fibromyalgia 05/07/2012    Standley Brooking, PTA 04/25/17 12:07 PM   [facility] [address] [place], [place], [address] Phone: [phone]   Fax:  [phone]  Name: [name] MRN: [id number] Date of Birth: 1940-06-02

## 2017-04-26 ENCOUNTER — Encounter: Payer: Self-pay | Admitting: Physical Therapy

## 2017-04-26 ENCOUNTER — Ambulatory Visit: Payer: PPO | Admitting: Physical Therapy

## 2017-04-26 DIAGNOSIS — M25562 Pain in left knee: Secondary | ICD-10-CM

## 2017-04-26 DIAGNOSIS — R600 Localized edema: Secondary | ICD-10-CM

## 2017-04-26 DIAGNOSIS — M25662 Stiffness of left knee, not elsewhere classified: Secondary | ICD-10-CM

## 2017-04-26 DIAGNOSIS — G8929 Other chronic pain: Secondary | ICD-10-CM

## 2017-04-26 NOTE — Therapy (Signed)
[facility] [place], [place], [address] Phone: [phone]   Fax:  [phone]  Physical Therapy Treatment  Patient Details  Name: [name] MRN: [id number] Date of Birth: [birth year] Referring Provider: Paralee Cancel MD   Encounter Date: 04/26/2017  PT End of Session - 04/26/17 1202    Visit Number  5    Number of Visits  12    Date for PT Re-Evaluation  05/14/17    Authorization Type  FOTO AT LEAST EVERY 5TH VISIT.    Authorization Time Period  To MD 03-29-17    PT Start Time  1115    PT Stop Time  1158    PT Time Calculation (min)  43 min    Activity Tolerance  Patient tolerated treatment well    Behavior During Therapy  [facility] for tasks assessed/performed       Past Medical History:  Diagnosis Date  . Anxiety   . Arthritis   . Complication of anesthesia   . Depression   . Dysrhythmia    Paroysmal Atrial Tachycardia  . Essential hypertension   . Fibromyalgia   . GERD (gastroesophageal reflux disease)   . Hyperlipemia   . PONV (postoperative nausea and vomiting)   . PSVT (paroxysmal supraventricular tachycardia) ([place])   . Reflux   . Restless leg syndrome   . Type 2 diabetes mellitus ([place])     Past Surgical History:  Procedure Laterality Date  . ABDOMINAL HYSTERECTOMY    . ANKLE SURGERY    . BACK SURGERY     T12-L1 fusion, Anterior Cervical fusion  . CARDIAC CATHETERIZATION N/A 02/16/2015   Procedure: Left Heart Cath and Coronary Angiography;  Surgeon: Sherren Mocha, MD;  Location: [place];  Service: Cardiovascular;  Laterality: N/A;  . CHOLECYSTECTOMY    . COLONOSCOPY    . ESOPHAGOGASTRODUODENOSCOPY  12/21/2010   Procedure: ESOPHAGOGASTRODUODENOSCOPY (EGD);  Surgeon: Landry Dyke, MD;  Location: [facility];  Service: Endoscopy;  Laterality: N/A;  . EXCISION MORTON'S NEUROMA    . FRACTURE SURGERY    . KNEE SURGERY     x 3  . NECK SURGERY    . ROTATOR CUFF REPAIR    . TONSILLECTOMY     . TOTAL KNEE ARTHROPLASTY Left 04/10/2017   Procedure: LEFT TOTAL KNEE ARTHROPLASTY;  Surgeon: Paralee Cancel, MD;  Location: [facility];  Service: Orthopedics;  Laterality: Left;  70 mins  . UPPER GASTROINTESTINAL ENDOSCOPY    . WRIST SURGERY      There were no vitals filed for this visit.  Subjective Assessment - 04/26/17 1150    Subjective  I'm feeling better but I still hurt a lot.    Pain Score  8     Pain Location  Knee    Pain Orientation  Left    Pain Type  Surgical pain    Pain Onset  1 to 4 weeks ago                      [facility] Adult PT Treatment/Exercise - 04/26/17 0001      Exercises   Exercises  Knee/Hip      Knee/Hip Exercises: Aerobic   Nustep  Level 1 x 15 minutes moving seat forward x 2 to increase knee flexion.      Knee/Hip Exercises: Supine   Other Supine Knee/Hip Exercises  2# Left LAQ's x 3 minutes f/b SLR's to fatigue.      Modalities  Modalities  Vasopneumatic      Vasopneumatic   Number Minutes Vasopneumatic   15 minutes    Vasopnuematic Location   -- Left knee.    Vasopneumatic Pressure  Medium      Manual Therapy   Manual Therapy  Passive ROM    Passive ROM  PROM in supine left knee flexion and extension stretching including hamstring and calf stretching x 5 minutes.               PT Short Term Goals - 04/16/17 1447      PT SHORT TERM GOAL #1   Title  STG's=LTG's.        PT Long Term Goals - 04/16/17 1447      PT LONG TERM GOAL #1   Title  Independent with a HEP.    Time  4    Period  Weeks    Status  New      PT LONG TERM GOAL #2   Title  Full active left knee extension in order to normalize gait.    Time  4    Period  Weeks    Status  New      PT LONG TERM GOAL #3   Title  Active knee flexion to 115 degrees+ so the patient can perform functional tasks and do so with pain not > 2-3/10.    Time  4    Period  Weeks    Status  New      PT LONG TERM GOAL #4   Title  Increase left knee strength to a  solid 4+/5 to provide good stability for accomplishment of functional activities.    Time  4    Period  Weeks    Status  New      PT LONG TERM GOAL #5   Title  Perform a reciprocating stair gait with one railing with pain not > 2-3/10.    Time  4    Period  Weeks    Status  New      PT LONG TERM GOAL #6   Title  Decrease edema to within 3 cms of non-affected side to assist with pain reduction and range of motion gains.    Time  4    Period  Weeks    Status  New            Plan - 04/26/17 1157    Clinical Impression Statement  The patient is making excellent progress with left knee extension to -5 degrees and able to achieve passive flexion to 110 degrees.    PT Treatment/Interventions  ADLs/Self Care Home Management;Cryotherapy;Electrical Stimulation;Gait training;Stair training;Functional mobility training;Therapeutic activities;Therapeutic exercise;Neuromuscular re-education;Patient/family education;Passive range of motion;Manual techniques;Vasopneumatic Device    PT Next Visit Plan  Total knee replacment protocol.  Electrical stimulation and vasopneumatic.    PT Home Exercise Plan  supine marching, bridge, SDLY clam, QL stretch in supine, sit to stand (hold bridge if painful)      1 visit left.. More visits?    Consulted and Agree with Plan of Care  Patient       Patient will benefit from skilled therapeutic intervention in order to improve the following deficits and impairments:  Abnormal gait, Decreased activity tolerance, Decreased mobility, Decreased range of motion, Decreased strength, Increased edema, Pain  Visit Diagnosis: Stiffness of left knee, not elsewhere classified  Chronic pain of left knee  Localized edema     Problem List Patient Active Problem List  Diagnosis Date Noted  . S/P left TKA 04/10/2017  . Unstable gait 01/12/2017  . Generalized osteoarthritis of hand 01/12/2017  . RLS (restless legs syndrome) 11/09/2016  . GERD (gastroesophageal  reflux disease) 07/13/2016  . Class 2 severe obesity due to excess calories with serious comorbidity and body mass index (BMI) of 36.0 to 36.9 in adult ([place]) 06/01/2016  . Vitamin D deficiency 06/01/2016  . Vitamin B12 deficiency 03/27/2016  . Chronic cough 03/16/2016  . Left foot pain 03/16/2016  . Demand ischemia ([place]) 02/14/2015  . Degenerative drusen 07/13/2013  . Type 2 diabetes mellitus without ophthalmic manifestations ([place]) 07/13/2013  . DM (diabetes mellitus) type II controlled, neurological manifestation ([place]) 05/07/2012  . Paroxysmal atrial tachycardia ([place]) 05/07/2012  . Essential hypertension, benign 05/07/2012  . Depression 05/07/2012  . Hyperlipidemia 05/07/2012  . Fibromyalgia 05/07/2012    Phinneas Shakoor, [place] MPT 04/26/2017, 12:06 PM  [facility] [address] [place], [place], [address] Phone: [phone]   Fax:  [phone]  Name: [name] MRN: [id number] Date of Birth: 1940/09/22

## 2017-04-27 ENCOUNTER — Encounter: Payer: PPO | Admitting: Physical Therapy

## 2017-04-30 ENCOUNTER — Encounter: Payer: PPO | Admitting: Physical Therapy

## 2017-05-01 ENCOUNTER — Other Ambulatory Visit: Payer: Self-pay | Admitting: *Deleted

## 2017-05-01 ENCOUNTER — Ambulatory Visit: Payer: PPO | Admitting: Physical Therapy

## 2017-05-01 ENCOUNTER — Telehealth: Payer: Self-pay | Admitting: *Deleted

## 2017-05-01 ENCOUNTER — Encounter: Payer: Self-pay | Admitting: Physical Therapy

## 2017-05-01 DIAGNOSIS — R600 Localized edema: Secondary | ICD-10-CM

## 2017-05-01 DIAGNOSIS — K219 Gastro-esophageal reflux disease without esophagitis: Secondary | ICD-10-CM

## 2017-05-01 DIAGNOSIS — M25562 Pain in left knee: Secondary | ICD-10-CM | POA: Diagnosis not present

## 2017-05-01 DIAGNOSIS — G8929 Other chronic pain: Secondary | ICD-10-CM

## 2017-05-01 DIAGNOSIS — M25662 Stiffness of left knee, not elsewhere classified: Secondary | ICD-10-CM

## 2017-05-01 MED ORDER — OMEPRAZOLE 20 MG PO CPDR
20.0000 mg | DELAYED_RELEASE_CAPSULE | Freq: Every day | ORAL | 2 refills | Status: DC
Start: 2017-05-01 — End: 2021-08-09

## 2017-05-01 NOTE — Telephone Encounter (Signed)
She is supposed to be on Zoloft 100 mg daily. Can you please find out what is the problem, does she want to decrease dose?  Thanks, BJ

## 2017-05-01 NOTE — Telephone Encounter (Signed)
Refill request for Sertraline 50 mg 1 tab daily #60

## 2017-05-01 NOTE — Therapy (Signed)
[facility] [place], [place], [address] Phone: [phone]   Fax:  [phone]  Physical Therapy Treatment  Patient Details  Name: [name] MRN: [id number] Date of Birth: 08/13/1940 Referring Provider: Paralee Cancel MD   Encounter Date: 05/01/2017  PT End of Session - 05/01/17 1240    Visit Number  6    Number of Visits  12    Date for PT Re-Evaluation  05/14/17    Authorization Type  FOTO AT LEAST EVERY 5TH VISIT.    Authorization Time Period  To MD 03-29-17    PT Start Time  1115    PT Stop Time  1158    PT Time Calculation (min)  43 min    Activity Tolerance  Patient tolerated treatment well    Behavior During Therapy  [facility] for tasks assessed/performed       Past Medical History:  Diagnosis Date  . Anxiety   . Arthritis   . Complication of anesthesia   . Depression   . Dysrhythmia    Paroysmal Atrial Tachycardia  . Essential hypertension   . Fibromyalgia   . GERD (gastroesophageal reflux disease)   . Hyperlipemia   . PONV (postoperative nausea and vomiting)   . PSVT (paroxysmal supraventricular tachycardia) ([place])   . Reflux   . Restless leg syndrome   . Type 2 diabetes mellitus ([place])     Past Surgical History:  Procedure Laterality Date  . ABDOMINAL HYSTERECTOMY    . ANKLE SURGERY    . BACK SURGERY     T12-L1 fusion, Anterior Cervical fusion  . CARDIAC CATHETERIZATION N/A 02/16/2015   Procedure: Left Heart Cath and Coronary Angiography;  Surgeon: Sherren Mocha, MD;  Location: [facility];  Service: Cardiovascular;  Laterality: N/A;  . CHOLECYSTECTOMY    . COLONOSCOPY    . ESOPHAGOGASTRODUODENOSCOPY  12/21/2010   Procedure: ESOPHAGOGASTRODUODENOSCOPY (EGD);  Surgeon: Landry Dyke, MD;  Location: [facility];  Service: Endoscopy;  Laterality: N/A;  . EXCISION MORTON'S NEUROMA    . FRACTURE SURGERY    . KNEE SURGERY     x 3  . NECK SURGERY    . ROTATOR CUFF REPAIR    . TONSILLECTOMY     . TOTAL KNEE ARTHROPLASTY Left 04/10/2017   Procedure: LEFT TOTAL KNEE ARTHROPLASTY;  Surgeon: Paralee Cancel, MD;  Location: [facility];  Service: Orthopedics;  Laterality: Left;  70 mins  . UPPER GASTROINTESTINAL ENDOSCOPY    . WRIST SURGERY      There were no vitals filed for this visit.  Subjective Assessment - 05/01/17 1243    Subjective  The patient presented to the clinic today in a great deal of distress c/o severe left knee pain.  She reports she was walking in her home without her walker and felt severe left sided knee pain.                        [facility] Adult PT Treatment/Exercise - 05/01/17 0001      Exercises   Exercises  Knee/Hip      Knee/Hip Exercises: Aerobic   Nustep  Level 1 x 15 minutes.      Modalities   Modalities  Health visitor Stimulation Location  Left knee.    Electrical Stimulation Action  IFC    Electrical Stimulation Parameters  1-10 Hz x 12 minutes.    Printmaker  Goals  Edema;Pain      Vasopneumatic   Number Minutes Vasopneumatic   12 minutes    Vasopnuematic Location   -- Left knee.    Vasopneumatic Pressure  Medium               PT Short Term Goals - 04/16/17 1447      PT SHORT TERM GOAL #1   Title  STG's=LTG's.        PT Long Term Goals - 04/16/17 1447      PT LONG TERM GOAL #1   Title  Independent with a HEP.    Time  4    Period  Weeks    Status  New      PT LONG TERM GOAL #2   Title  Full active left knee extension in order to normalize gait.    Time  4    Period  Weeks    Status  New      PT LONG TERM GOAL #3   Title  Active knee flexion to 115 degrees+ so the patient can perform functional tasks and do so with pain not > 2-3/10.    Time  4    Period  Weeks    Status  New      PT LONG TERM GOAL #4   Title  Increase left knee strength to a solid 4+/5 to provide good stability for accomplishment of functional activities.     Time  4    Period  Weeks    Status  New      PT LONG TERM GOAL #5   Title  Perform a reciprocating stair gait with one railing with pain not > 2-3/10.    Time  4    Period  Weeks    Status  New      PT LONG TERM GOAL #6   Title  Decrease edema to within 3 cms of non-affected side to assist with pain reduction and range of motion gains.    Time  4    Period  Weeks    Status  New            Plan - 05/01/17 1245    Clinical Impression Statement  Patient was re-assessed today and found to have less edema than when she began therapy.  She demonstrated a negative Homan test and her calf was not warm or significantly tender to touch.  The patient demonstrated full left knee extension easily today.  As she thought about what happened she feels that she may have "torn some scar tissue."  By the conclusion of the treatment, which she tolerated very well, she was very calm and relieved.       PT Treatment/Interventions  ADLs/Self Care Home Management;Cryotherapy;Electrical Stimulation;Gait training;Stair training;Functional mobility training;Therapeutic activities;Therapeutic exercise;Neuromuscular re-education;Patient/family education;Passive range of motion;Manual techniques;Vasopneumatic Device    PT Next Visit Plan  Total knee replacment protocol.  Electrical stimulation and vasopneumatic.    PT Home Exercise Plan  supine marching, bridge, SDLY clam, QL stretch in supine, sit to stand (hold bridge if painful)      1 visit left.. More visits?    Consulted and Agree with Plan of Care  Patient       Patient will benefit from skilled therapeutic intervention in order to improve the following deficits and impairments:  Abnormal gait, Decreased activity tolerance, Decreased mobility, Decreased range of motion, Decreased strength, Increased edema, Pain  Visit Diagnosis: Chronic pain of left knee  Stiffness of left knee, not elsewhere classified  Localized edema     Problem List Patient  Active Problem List   Diagnosis Date Noted  . S/P left TKA 04/10/2017  . Unstable gait 01/12/2017  . Generalized osteoarthritis of hand 01/12/2017  . RLS (restless legs syndrome) 11/09/2016  . GERD (gastroesophageal reflux disease) 07/13/2016  . Class 2 severe obesity due to excess calories with serious comorbidity and body mass index (BMI) of 36.0 to 36.9 in adult ([place]) 06/01/2016  . Vitamin D deficiency 06/01/2016  . Vitamin B12 deficiency 03/27/2016  . Chronic cough 03/16/2016  . Left foot pain 03/16/2016  . Demand ischemia ([place]) 02/14/2015  . Degenerative drusen 07/13/2013  . Type 2 diabetes mellitus without ophthalmic manifestations ([place]) 07/13/2013  . DM (diabetes mellitus) type II controlled, neurological manifestation ([place]) 05/07/2012  . Paroxysmal atrial tachycardia ([place]) 05/07/2012  . Essential hypertension, benign 05/07/2012  . Depression 05/07/2012  . Hyperlipidemia 05/07/2012  . Fibromyalgia 05/07/2012    Trenace Coughlin, [place] MPT 05/01/2017, 12:54 PM  [facility] [address] [place], [place], [address] Phone: [phone]   Fax:  [phone]  Name: [name] MRN: [id number] Date of Birth: 09-Jun-1940

## 2017-05-02 ENCOUNTER — Other Ambulatory Visit: Payer: Self-pay | Admitting: *Deleted

## 2017-05-02 ENCOUNTER — Ambulatory Visit: Payer: PPO | Admitting: Physical Therapy

## 2017-05-02 ENCOUNTER — Encounter: Payer: Self-pay | Admitting: Physical Therapy

## 2017-05-02 DIAGNOSIS — M25662 Stiffness of left knee, not elsewhere classified: Secondary | ICD-10-CM

## 2017-05-02 DIAGNOSIS — M25562 Pain in left knee: Secondary | ICD-10-CM | POA: Diagnosis not present

## 2017-05-02 DIAGNOSIS — R600 Localized edema: Secondary | ICD-10-CM

## 2017-05-02 DIAGNOSIS — F330 Major depressive disorder, recurrent, mild: Secondary | ICD-10-CM

## 2017-05-02 DIAGNOSIS — I10 Essential (primary) hypertension: Secondary | ICD-10-CM

## 2017-05-02 DIAGNOSIS — G8929 Other chronic pain: Secondary | ICD-10-CM

## 2017-05-02 MED ORDER — SERTRALINE HCL 100 MG PO TABS: 100.0000 mg | ORAL_TABLET | Freq: Every day | ORAL | 4 refills | Status: DC

## 2017-05-02 MED ORDER — INSULIN GLARGINE 100 UNIT/ML ~~LOC~~ SOLN: 60.0000 [IU] | Freq: Every day | SUBCUTANEOUS | 3 refills | Status: DC

## 2017-05-02 MED ORDER — METOPROLOL SUCCINATE ER 25 MG PO TB24: 25.0000 mg | ORAL_TABLET | Freq: Every day | ORAL | 1 refills | Status: DC

## 2017-05-02 NOTE — Therapy (Signed)
[facility] [place], [place], [address] Phone: [phone]   Fax:  [phone]  Physical Therapy Treatment  Patient Details  Name: [name] MRN: [id number] Date of Birth: [birth year] Referring Provider: Paralee Cancel MD   Encounter Date: 05/02/2017  PT End of Session - 05/02/17 1236    Visit Number  7    Number of Visits  12    Date for PT Re-Evaluation  05/14/17    Authorization Type  FOTO AT LEAST EVERY 5TH VISIT.    Authorization Time Period  To MD 03-29-17    PT Start Time  1115    PT Stop Time  1156    PT Time Calculation (min)  41 min    Activity Tolerance  Patient tolerated treatment well    Behavior During Therapy  [facility] for tasks assessed/performed       Past Medical History:  Diagnosis Date  . Anxiety   . Arthritis   . Complication of anesthesia   . Depression   . Dysrhythmia    Paroysmal Atrial Tachycardia  . Essential hypertension   . Fibromyalgia   . GERD (gastroesophageal reflux disease)   . Hyperlipemia   . PONV (postoperative nausea and vomiting)   . PSVT (paroxysmal supraventricular tachycardia) ([place])   . Reflux   . Restless leg syndrome   . Type 2 diabetes mellitus ([place])     Past Surgical History:  Procedure Laterality Date  . ABDOMINAL HYSTERECTOMY    . ANKLE SURGERY    . BACK SURGERY     T12-L1 fusion, Anterior Cervical fusion  . CARDIAC CATHETERIZATION N/A 02/16/2015   Procedure: Left Heart Cath and Coronary Angiography;  Surgeon: Sherren Mocha, MD;  Location: [facility];  Service: Cardiovascular;  Laterality: N/A;  . CHOLECYSTECTOMY    . COLONOSCOPY    . ESOPHAGOGASTRODUODENOSCOPY  12/21/2010   Procedure: ESOPHAGOGASTRODUODENOSCOPY (EGD);  Surgeon: Landry Dyke, MD;  Location: [facility];  Service: Endoscopy;  Laterality: N/A;  . EXCISION MORTON'S NEUROMA    . FRACTURE SURGERY    . KNEE SURGERY     x 3  . NECK SURGERY    . ROTATOR CUFF REPAIR    . TONSILLECTOMY     . TOTAL KNEE ARTHROPLASTY Left 04/10/2017   Procedure: LEFT TOTAL KNEE ARTHROPLASTY;  Surgeon: Paralee Cancel, MD;  Location: [facility];  Service: Orthopedics;  Laterality: Left;  70 mins  . UPPER GASTROINTESTINAL ENDOSCOPY    . WRIST SURGERY      There were no vitals filed for this visit.  Subjective Assessment - 05/02/17 1232    Subjective  I'm doing better today.  I went home and took my pain medication.  I still want to go easy today.    Patient Stated Goals  I would like to be more mobile, walk better and travel.    Pain Score  5     Pain Location  Knee    Pain Orientation  Left    Pain Descriptors / Indicators  Sore;Aching    Pain Type  Surgical pain    Pain Onset  1 to 4 weeks ago                      [facility] Adult PT Treatment/Exercise - 05/02/17 0001      Exercises   Exercises  Knee/Hip      Knee/Hip Exercises: Aerobic   Nustep  Level 3 x 14 minutes moving seat  forward as tolerated to increase flexion.      Modalities   Modalities  Health visitor Stimulation Location  Left knee.    Electrical Stimulation Action  IFC    Electrical Stimulation Parameters  1-10 Hz x 15 minutes.    Electrical Stimulation Goals  Edema;Pain      Vasopneumatic   Number Minutes Vasopneumatic   15 minutes    Vasopnuematic Location   -- Left knee.    Vasopneumatic Pressure  Low               PT Short Term Goals - 04/16/17 1447      PT SHORT TERM GOAL #1   Title  STG's=LTG's.        PT Long Term Goals - 04/16/17 1447      PT LONG TERM GOAL #1   Title  Independent with a HEP.    Time  4    Period  Weeks    Status  New      PT LONG TERM GOAL #2   Title  Full active left knee extension in order to normalize gait.    Time  4    Period  Weeks    Status  New      PT LONG TERM GOAL #3   Title  Active knee flexion to 115 degrees+ so the patient can perform functional tasks and do so with pain not  > 2-3/10.    Time  4    Period  Weeks    Status  New      PT LONG TERM GOAL #4   Title  Increase left knee strength to a solid 4+/5 to provide good stability for accomplishment of functional activities.    Time  4    Period  Weeks    Status  New      PT LONG TERM GOAL #5   Title  Perform a reciprocating stair gait with one railing with pain not > 2-3/10.    Time  4    Period  Weeks    Status  New      PT LONG TERM GOAL #6   Title  Decrease edema to within 3 cms of non-affected side to assist with pain reduction and range of motion gains.    Time  4    Period  Weeks    Status  New            Plan - 05/02/17 1238    Clinical Impression Statement  Patient doing much better today but knee remains sore.  Her left knee passive extension was full range today.    PT Treatment/Interventions  ADLs/Self Care Home Management;Cryotherapy;Electrical Stimulation;Gait training;Stair training;Functional mobility training;Therapeutic activities;Therapeutic exercise;Neuromuscular re-education;Patient/family education;Passive range of motion;Manual techniques;Vasopneumatic Device    PT Next Visit Plan  Total knee replacment protocol.  Electrical stimulation and vasopneumatic.    PT Home Exercise Plan  supine marching, bridge, SDLY clam, QL stretch in supine, sit to stand (hold bridge if painful)      1 visit left.. More visits?    Consulted and Agree with Plan of Care  Patient       Patient will benefit from skilled therapeutic intervention in order to improve the following deficits and impairments:  Abnormal gait, Decreased activity tolerance, Decreased mobility, Decreased range of motion, Decreased strength, Increased edema, Pain  Visit Diagnosis: Chronic pain of left knee  Stiffness of left knee,  not elsewhere classified  Localized edema     Problem List Patient Active Problem List   Diagnosis Date Noted  . S/P left TKA 04/10/2017  . Unstable gait 01/12/2017  . Generalized  osteoarthritis of hand 01/12/2017  . RLS (restless legs syndrome) 11/09/2016  . GERD (gastroesophageal reflux disease) 07/13/2016  . Class 2 severe obesity due to excess calories with serious comorbidity and body mass index (BMI) of 36.0 to 36.9 in adult ([place]) 06/01/2016  . Vitamin D deficiency 06/01/2016  . Vitamin B12 deficiency 03/27/2016  . Chronic cough 03/16/2016  . Left foot pain 03/16/2016  . Demand ischemia ([place]) 02/14/2015  . Degenerative drusen 07/13/2013  . Type 2 diabetes mellitus without ophthalmic manifestations ([place]) 07/13/2013  . DM (diabetes mellitus) type II controlled, neurological manifestation ([place]) 05/07/2012  . Paroxysmal atrial tachycardia ([place]) 05/07/2012  . Essential hypertension, benign 05/07/2012  . Depression 05/07/2012  . Hyperlipidemia 05/07/2012  . Fibromyalgia 05/07/2012    Dondrea Clendenin, [place] MPT 05/02/2017, 12:39 PM  [facility] [address] [place], [place], [address] Phone: [phone]   Fax:  [phone]  Name: [name] MRN: [id number] Date of Birth: 05-05-1940

## 2017-05-02 NOTE — Telephone Encounter (Signed)
Spoke with patient she confirmed 100 mg of Zoloft daily. Rx sent to pharmacy

## 2017-05-04 ENCOUNTER — Ambulatory Visit: Payer: PPO | Admitting: Physical Therapy

## 2017-05-04 ENCOUNTER — Ambulatory Visit (HOSPITAL_COMMUNITY)
Admission: RE | Admit: 2017-05-04 | Discharge: 2017-05-04 | Disposition: A | Discharge status: Home / Self Care | Payer: PPO | Source: Ambulatory Visit | Attending: Internal Medicine | Admitting: Internal Medicine

## 2017-05-04 ENCOUNTER — Encounter: Payer: Self-pay | Admitting: Internal Medicine

## 2017-05-04 ENCOUNTER — Encounter: Payer: Self-pay | Admitting: Physical Therapy

## 2017-05-04 ENCOUNTER — Ambulatory Visit (INDEPENDENT_AMBULATORY_CARE_PROVIDER_SITE_OTHER): Payer: PPO | Admitting: Internal Medicine

## 2017-05-04 VITALS — BP 158/92 | HR 84 | Temp 97.6°F | Wt 197.2 lb

## 2017-05-04 DIAGNOSIS — R600 Localized edema: Secondary | ICD-10-CM

## 2017-05-04 DIAGNOSIS — Z9889 Other specified postprocedural states: Secondary | ICD-10-CM

## 2017-05-04 DIAGNOSIS — M79605 Pain in left leg: Secondary | ICD-10-CM

## 2017-05-04 DIAGNOSIS — R5383 Other fatigue: Secondary | ICD-10-CM | POA: Diagnosis not present

## 2017-05-04 DIAGNOSIS — M25662 Stiffness of left knee, not elsewhere classified: Secondary | ICD-10-CM

## 2017-05-04 DIAGNOSIS — G8929 Other chronic pain: Secondary | ICD-10-CM

## 2017-05-04 DIAGNOSIS — R8290 Unspecified abnormal findings in urine: Secondary | ICD-10-CM | POA: Diagnosis not present

## 2017-05-04 DIAGNOSIS — Z794 Long term (current) use of insulin: Secondary | ICD-10-CM

## 2017-05-04 DIAGNOSIS — E1140 Type 2 diabetes mellitus with diabetic neuropathy, unspecified: Secondary | ICD-10-CM

## 2017-05-04 DIAGNOSIS — M797 Fibromyalgia: Secondary | ICD-10-CM

## 2017-05-04 DIAGNOSIS — M25562 Pain in left knee: Secondary | ICD-10-CM | POA: Diagnosis not present

## 2017-05-04 LAB — COMPREHENSIVE METABOLIC PANEL
ALT: 15 U/L (ref 0–35)
AST: 16 U/L (ref 0–37)
Albumin: 4.2 g/dL (ref 3.5–5.2)
Alkaline Phosphatase: 98 U/L (ref 39–117)
BUN: 22 mg/dL (ref 6–23)
CO2: 21 mEq/L (ref 19–32)
Calcium: 9.6 mg/dL (ref 8.4–10.5)
Chloride: 104 mEq/L (ref 96–112)
Creatinine, Ser: 0.94 mg/dL (ref 0.40–1.20)
GFR: 61.75 mL/min (ref >60.00)
Glucose, Bld: 102 mg/dL — ABNORMAL HIGH (ref 70–99)
Potassium: 3.9 mEq/L (ref 3.5–5.1)
Sodium: 140 mEq/L (ref 135–145)
Total Bilirubin: 0.6 mg/dL (ref 0.2–1.2)
Total Protein: 6.8 g/dL (ref 6.0–8.3)

## 2017-05-04 LAB — POC URINALSYSI DIPSTICK (AUTOMATED)
Blood, UA: NEGATIVE
Glucose, UA: NEGATIVE
Nitrite, UA: NEGATIVE
Spec Grav, UA: >=1.03 — AB (ref 1.010–1.025)
Urobilinogen, UA: 1 E.U./dL
pH, UA: 5 (ref 5.0–8.0)

## 2017-05-04 LAB — CBC WITH DIFFERENTIAL/PLATELET
Basophils Absolute: 0 10*3/uL (ref 0.0–0.1)
Basophils Relative: 0.5 % (ref 0.0–3.0)
Eosinophils Absolute: 0.1 10*3/uL (ref 0.0–0.7)
Eosinophils Relative: 1.5 % (ref 0.0–5.0)
HCT: 38.6 % (ref 36.0–46.0)
Hemoglobin: 12.8 g/dL (ref 12.0–15.0)
Lymphocytes Relative: 29.1 % (ref 12.0–46.0)
Lymphs Abs: 2.8 10*3/uL (ref 0.7–4.0)
MCHC: 33.3 g/dL (ref 30.0–36.0)
MCV: 87.5 fl (ref 78.0–100.0)
Monocytes Absolute: 0.7 10*3/uL (ref 0.1–1.0)
Monocytes Relative: 7.2 % (ref 3.0–12.0)
Neutro Abs: 5.8 10*3/uL (ref 1.4–7.7)
Neutrophils Relative %: 61.7 % (ref 43.0–77.0)
Platelets: 318 10*3/uL (ref 150.0–400.0)
RBC: 4.41 Mil/uL (ref 3.87–5.11)
RDW: 14.4 % (ref 11.5–15.5)
WBC: 9.5 10*3/uL (ref 4.0–10.5)

## 2017-05-04 LAB — TSH: TSH: 0.92 u[IU]/mL (ref 0.35–4.50)

## 2017-05-04 LAB — SEDIMENTATION RATE: Sed Rate: 18 mm/hr (ref 0–30)

## 2017-05-04 NOTE — Progress Notes (Signed)
Chief Complaint  Patient presents with  . Knee Pain    Pt had L knee surgery 3 weeks 4 days ago. Having decreased energy and does not feel well at all. Pt states that her PT recommeded at Doppler asap for possible blood clot. Pt states that her leg is aching worse than a toothache. Pt states that she has really no help at home.     HPI: [name] 77 y.o. come in  With companion friend for sda  PCP NA   Send in by rehab  Getting knee pain   rx    And   Pt sent her for  Doppler to check for blood clot   Last seen  By pcp 2 25  She is post LTKA  Dr Alvan Dame and checked a week ago  And doing well   ? If feverish .  Says feeling bad all over  Tired not feeling well.  Not hungry and doesn't want to fix food .    Some depression since husb died but   More tired in past weeks and  Left leg hrting more? 4 days ago got up and had a as sharp twinge lateral left knee  And  Then improved but  Not able to walk as well   Oxycodone   Some every day  And not  2 per day .  Now on hydrocodone . Has fibromyalgia .and hurst all over   Blood sugar .  In control  ROS: See pertinent positives and negatives per HPI. No cp sob new  No uti sx but had incontinence after surgery .    No bleeding     Past Medical History:  Diagnosis Date  . Anxiety   . Arthritis   . Complication of anesthesia   . Depression   . Dysrhythmia    Paroysmal Atrial Tachycardia  . Essential hypertension   . Fibromyalgia   . GERD (gastroesophageal reflux disease)   . Hyperlipemia   . PONV (postoperative nausea and vomiting)   . PSVT (paroxysmal supraventricular tachycardia) ([place])   . Reflux   . Restless leg syndrome   . Type 2 diabetes mellitus (HCC)     Family History  Problem Relation Age of Onset  . Heart failure Mother   . Hyperlipidemia Mother   . Lung cancer Father        Lymphoma  . Hyperlipidemia Maternal Grandmother   . Peripheral vascular disease Maternal Grandmother     Social History   Socioeconomic  History  . Marital status: Widowed    Spouse name: Glendell Docker  . Number of children: 2  . Years of education: Not on file  . Highest education level: Not on file  Occupational History  . Not on file  Social Needs  . Financial resource strain: Not on file  . Food insecurity:    Worry: Not on file    Inability: Not on file  . Transportation needs:    Medical: Not on file    Non-medical: Not on file  Tobacco Use  . Smoking status: Never Smoker  . Smokeless tobacco: Never Used  Substance and Sexual Activity  . Alcohol use: No  . Drug use: No  . Sexual activity: Not on file  Lifestyle  . Physical activity:    Days per week: Not on file    Minutes per session: Not on file  . Stress: Not on file  Relationships  . Social connections:    Talks on  phone: Not on file    Gets together: Not on file    Attends religious service: Not on file    Active member of club or organization: Not on file    Attends meetings of clubs or organizations: Not on file    Relationship status: Not on file  Other Topics Concern  . Not on file  Social History Narrative   Lives with husband.  Independent of ADLs.    Outpatient Medications Prior to Visit  Medication Sig Dispense Refill  . aspirin (ASPIRIN CHILDRENS) 81 MG chewable tablet Chew 1 tablet (81 mg total) by mouth 2 (two) times daily. Start the day after finishing Xarelto. Take for 4 weeks, then resume regular dose. 60 tablet 0  . Calcium Citrate-Vitamin D (CALCIUM CITRATE +D PO) Take 1 tablet by mouth at bedtime.     . cholecalciferol (VITAMIN D) 1000 units tablet Take 1,000 Units by mouth daily.    . Cranberry 1000 MG CAPS Take 1 capsule by mouth 2 (two) times daily. + Vit C 40 mg    . Cyanocobalamin (VITAMIN B-12 PO) Take 1 tablet by mouth daily.    . digoxin (LANOXIN) 0.125 MG tablet Take 0.125 mg by mouth daily.     Marland Kitchen docusate sodium (COLACE) 100 MG capsule Take 1 capsule (100 mg total) by mouth 2 (two) times daily. 10 capsule 0  . ezetimibe  (ZETIA) 10 MG tablet Take 1 tablet (10 mg total) by mouth daily. 90 tablet 2  . ferrous sulfate (FERROUSUL) 325 (65 FE) MG tablet Take 1 tablet (325 mg total) by mouth 3 (three) times daily with meals.  3  . insulin glargine (LANTUS) 100 UNIT/ML injection Inject 0.6 mLs (60 Units total) into the skin at bedtime. 10 mL 3  . loperamide (IMODIUM A-D) 2 MG tablet Take 2 mg by mouth 4 (four) times daily as needed for diarrhea or loose stools.    . metFORMIN (GLUCOPHAGE) 1000 MG tablet Take 1,000 mg by mouth 2 (two) times daily with a meal.     . methocarbamol (ROBAXIN) 500 MG tablet Take 1 tablet (500 mg total) by mouth every 6 (six) hours as needed for muscle spasms. 40 tablet 0  . metoprolol succinate (TOPROL-XL) 25 MG 24 hr tablet Take 1 tablet (25 mg total) by mouth daily. 90 tablet 1  . Multiple Vitamins-Minerals (PRESERVISION AREDS 2 PO) Take 1 capsule by mouth 2 (two) times daily.    . nitroGLYCERIN (NITROSTAT) 0.4 MG SL tablet Place 0.4 mg under the tongue every 5 (five) minutes as needed. As needed chest pain    . omeprazole (PRILOSEC) 20 MG capsule Take 1 capsule (20 mg total) by mouth daily. 90 capsule 2  . polyethylene glycol (MIRALAX / GLYCOLAX) packet Take 17 g by mouth 2 (two) times daily. 14 each 0  . ramipril (ALTACE) 10 MG capsule TAKE 1 CAPSULE BY MOUTH ONCE DAILY 90 capsule 0  . RELION INSULIN SYRINGE 1ML/31G 31G X 5/16" 1 ML MISC Use as directed 100 each 1  . rOPINIRole (REQUIP) 1 MG tablet Take 1 tablet (1 mg total) by mouth at bedtime. 90 tablet 1  . sertraline (ZOLOFT) 100 MG tablet Take 1 tablet (100 mg total) by mouth daily. 30 tablet 4  . verapamil (VERELAN PM) 120 MG 24 hr capsule Take 120 mg by mouth daily as needed (tachycardia).     . verapamil (VERELAN PM) 240 MG 24 hr capsule Take 1 capsule (240 mg total) by mouth daily.  30 capsule 2  . HYDROcodone-acetaminophen (NORCO) 7.5-325 MG tablet Take 1-2 tablets by mouth every 4 (four) hours as needed for moderate pain. (Patient  not taking: Reported on 05/04/2017) 60 tablet 0  . rivaroxaban (XARELTO) 10 MG TABS tablet Take 1 tablet (10 mg total) by mouth daily for 14 days. 14 tablet 0   No facility-administered medications prior to visit.      EXAM:  Pulse 84   Temp 97.6 F (36.4 C) (Oral)   Wt 197 lb 3.2 oz (89.4 kg)   BMI 34.38 kg/m   Body mass index is 34.38 kg/m.  GENERAL: vitals reviewed and listed above, alert, oriented, appears well hydrated and in no acute distress  Co of feeling bad but non toxic   HEENT: atraumatic, conjunctiva  clear, no obvious abnormalities on inspection of external nose and ears   NECK: no obvious masses on inspection palpation  LUNGS: clear to auscultation bilaterally, no wheezes, rales or rhonchi, good air movement CV: HRRR, no clubbing cyanosis  Left keg healing  Knee scar with pink and swelling  No streaking   No dc  nofocal tenderness left  Left slightly edema compared to right MS: moves all extremities can use walker   PSYCH: pleasant and cooperative, actes tired and down  Lab Results  Component Value Date   WBC 10.6 (H) 04/11/2017   HGB 11.5 (L) 04/11/2017   HCT 34.0 (L) 04/11/2017   PLT 184 04/11/2017   GLUCOSE 141 (H) 04/11/2017   CHOL 151 01/20/2016   TRIG 438 (A) 01/20/2016   HDL 27 (A) 01/20/2016   LDLCALC 82 01/20/2016   ALT 28 01/20/2016   AST 23 01/20/2016   NA 135 04/11/2017   K 4.9 04/11/2017   CL 104 04/11/2017   CREATININE 0.81 04/11/2017   BUN 19 04/11/2017   CO2 24 04/11/2017   TSH 1.179 02/14/2015   INR 1.00 02/16/2015   HGBA1C 6.8 (H) 04/05/2017   MICROALBUR 26.1 (H) 01/12/2017   BP Readings from Last 3 Encounters:  04/16/17 (!) 146/75  04/11/17 (!) 181/79  04/09/17 130/76   Wt Readings from Last 3 Encounters:  05/04/17 197 lb 3.2 oz (89.4 kg)  04/10/17 200 lb (90.7 kg)  04/09/17 200 lb 6 oz (90.9 kg)    ASSESSMENT AND PLAN:  Discussed the following assessment and plan:  Pain in central left lower extremity - Plan: CBC with  Differential/Platelet, CMP, Sedimentation rate, TSH, VAS [place] LOWER EXTREMITY VENOUS (DVT), POCT Urinalysis Dipstick (Automated)  Other fatigue - Plan: CBC with Differential/Platelet, CMP, Sedimentation rate, TSH, VAS [place] LOWER EXTREMITY VENOUS (DVT), POCT Urinalysis Dipstick (Automated)  Controlled type 2 diabetes mellitus with diabetic neuropathy, with long-term current use of insulin (HCC) - Plan: CBC with Differential/Platelet, CMP, Sedimentation rate, TSH, VAS [place] LOWER EXTREMITY VENOUS (DVT), POCT Urinalysis Dipstick (Automated)  Fibromyalgia - Plan: CBC with Differential/Platelet, CMP, Sedimentation rate, TSH, VAS [place] LOWER EXTREMITY VENOUS (DVT), POCT Urinalysis Dipstick (Automated)  S/P knee surgery - Plan: CBC with Differential/Platelet, CMP, Sedimentation rate, TSH, VAS [place] LOWER EXTREMITY VENOUS (DVT), POCT Urinalysis Dipstick (Automated)  Abnormal urinalysis - Plan: Urine Culture, Urine Culture prob not dvt but   Underlying  Other  Sx  And perhaps the fl;are up of knee pain this week  Contributing  advise lab and doppler  Fu with PCP and ortho   Ortho usually does  HH orders  But  pcp can follow up  If needed   Report from  Vascular  NO  DVT advise fu with ortho about the increase in pain  Labs pending  And fu with PCP   Suspect underlying fm and  Bereavement  Is  Making recovery more difficult but  Knee  Still needs reevaluation -Patient advised to return or notify health care team  if  new concerns arise.  Patient Instructions  Can do labs today    Check for anemia  Unexpected metabolic issues    And doppler  And want  You to see  Your ortho again .  If knee pain not subsided based on  The episode of knee pain this week.     Standley Brooking. Jeanni Allshouse M.D.

## 2017-05-04 NOTE — Patient Instructions (Addendum)
Can do labs today    Check for anemia  Unexpected metabolic issues    And doppler  And want  You to see  Your ortho again .  If knee pain not subsided based on  The episode of knee pain this week.

## 2017-05-04 NOTE — Therapy (Signed)
[facility] [place], [place], [address] Phone: [phone]   Fax:  [phone]  Physical Therapy Treatment  Patient Details  Name: [name] MRN: [id number] Date of Birth: [birth year] Referring Provider: Paralee Cancel MD   Encounter Date: 05/04/2017  PT End of Session - 05/04/17 1121    Visit Number  8    Number of Visits  12    Date for PT Re-Evaluation  05/14/17    Authorization Type  FOTO AT LEAST EVERY 5TH VISIT.    Authorization Time Period  To MD 03-29-17    PT Start Time  1121    PT Stop Time  1146    PT Time Calculation (min)  25 min    Activity Tolerance  Patient tolerated treatment well    Behavior During Therapy  [facility] for tasks assessed/performed       Past Medical History:  Diagnosis Date  . Anxiety   . Arthritis   . Complication of anesthesia   . Depression   . Dysrhythmia    Paroysmal Atrial Tachycardia  . Essential hypertension   . Fibromyalgia   . GERD (gastroesophageal reflux disease)   . Hyperlipemia   . PONV (postoperative nausea and vomiting)   . PSVT (paroxysmal supraventricular tachycardia) ([place])   . Reflux   . Restless leg syndrome   . Type 2 diabetes mellitus ([place])     Past Surgical History:  Procedure Laterality Date  . ABDOMINAL HYSTERECTOMY    . ANKLE SURGERY    . BACK SURGERY     T12-L1 fusion, Anterior Cervical fusion  . CARDIAC CATHETERIZATION N/A 02/16/2015   Procedure: Left Heart Cath and Coronary Angiography;  Surgeon: Sherren Mocha, MD;  Location: [facility];  Service: Cardiovascular;  Laterality: N/A;  . CHOLECYSTECTOMY    . COLONOSCOPY    . ESOPHAGOGASTRODUODENOSCOPY  12/21/2010   Procedure: ESOPHAGOGASTRODUODENOSCOPY (EGD);  Surgeon: Landry Dyke, MD;  Location: [facility];  Service: Endoscopy;  Laterality: N/A;  . EXCISION MORTON'S NEUROMA    . FRACTURE SURGERY    . KNEE SURGERY     x 3  . NECK SURGERY    . ROTATOR CUFF REPAIR    . TONSILLECTOMY     . TOTAL KNEE ARTHROPLASTY Left 04/10/2017   Procedure: LEFT TOTAL KNEE ARTHROPLASTY;  Surgeon: Paralee Cancel, MD;  Location: [facility];  Service: Orthopedics;  Laterality: Left;  70 mins  . UPPER GASTROINTESTINAL ENDOSCOPY    . WRIST SURGERY      There were no vitals filed for this visit.  Subjective Assessment - 05/04/17 1116    Subjective  Reports she has had a Fibromyalgia flare and reports that she has a MD appt today at 12:30 pm.     Pertinent History  Neck, back, right RTC, left wrist and left ankle surgery.  Athritis and DM.    Patient Stated Goals  I would like to be more mobile, walk better and travel.    Currently in Pain?  Yes    Pain Score  -- "25/10"    Pain Location  Leg    Pain Orientation  Left    Pain Descriptors / Indicators  Discomfort    Pain Type  Surgical pain    Pain Onset  1 to 4 weeks ago         [facility] PT Assessment - 05/04/17 0001      Assessment   Medical Diagnosis  Left total knee replacement.  Onset Date/Surgical Date  04/10/17    Next MD Visit  04/27/2017      Precautions   Precautions  Fall      Restrictions   Weight Bearing Restrictions  No            No data recorded       [facility] Adult PT Treatment/Exercise - 05/04/17 0001      Manual Therapy   Manual Therapy  Soft tissue mobilization    Soft tissue mobilization  Gentle/ very light MEM of LLE to reduce edema               PT Short Term Goals - 04/16/17 1447      PT SHORT TERM GOAL #1   Title  STG's=LTG's.        PT Long Term Goals - 04/16/17 1447      PT LONG TERM GOAL #1   Title  Independent with a HEP.    Time  4    Period  Weeks    Status  New      PT LONG TERM GOAL #2   Title  Full active left knee extension in order to normalize gait.    Time  4    Period  Weeks    Status  New      PT LONG TERM GOAL #3   Title  Active knee flexion to 115 degrees+ so the patient can perform functional tasks and do so with pain not > 2-3/10.    Time  4     Period  Weeks    Status  New      PT LONG TERM GOAL #4   Title  Increase left knee strength to a solid 4+/5 to provide good stability for accomplishment of functional activities.    Time  4    Period  Weeks    Status  New      PT LONG TERM GOAL #5   Title  Perform a reciprocating stair gait with one railing with pain not > 2-3/10.    Time  4    Period  Weeks    Status  New      PT LONG TERM GOAL #6   Title  Decrease edema to within 3 cms of non-affected side to assist with pain reduction and range of motion gains.    Time  4    Period  Weeks    Status  New            Plan - 05/04/17 1209    Clinical Impression Statement  Patient tolerated today's treatment fair as she arrived with intense L knee pain and reports of a fibromyalgia flare. Patient very tender to very light palpation of L medial calf where three circles of yellow bruising located. Very light and gentle MEM completed from L ankle where increased edema notable to L hip. No modalities completed secondary to patient's intolerance.        Patient will benefit from skilled therapeutic intervention in order to improve the following deficits and impairments:     Visit Diagnosis: Chronic pain of left knee  Stiffness of left knee, not elsewhere classified  Localized edema     Problem List Patient Active Problem List   Diagnosis Date Noted  . S/P left TKA 04/10/2017  . Unstable gait 01/12/2017  . Generalized osteoarthritis of hand 01/12/2017  . RLS (restless legs syndrome) 11/09/2016  . GERD (gastroesophageal reflux disease) 07/13/2016  . Class 2  severe obesity due to excess calories with serious comorbidity and body mass index (BMI) of 36.0 to 36.9 in adult [facility]) 06/01/2016  . Vitamin D deficiency 06/01/2016  . Vitamin B12 deficiency 03/27/2016  . Chronic cough 03/16/2016  . Left foot pain 03/16/2016  . Demand ischemia ([place]) 02/14/2015  . Degenerative drusen 07/13/2013  . Type 2 diabetes mellitus without  ophthalmic manifestations ([place]) 07/13/2013  . DM (diabetes mellitus) type II controlled, neurological manifestation ([place]) 05/07/2012  . Paroxysmal atrial tachycardia ([place]) 05/07/2012  . Essential hypertension, benign 05/07/2012  . Depression 05/07/2012  . Hyperlipidemia 05/07/2012  . Fibromyalgia 05/07/2012    Standley Brooking, PTA 05/04/2017, 12:20 PM  [facility] [address] [place], [place], [address] Phone: [phone]   Fax:  [phone]  Name: [name] MRN: [id number] Date of Birth: 1940/05/08

## 2017-05-04 NOTE — Progress Notes (Signed)
*  Preliminary Results* Left lower extremity venous duplex completed. Left lower extremity is negative for deep vein thrombosis. There is no evidence of left Baker's cyst.  05/04/2017 3:21 PM Maudry Mayhew, BS, RVT, RDCS, RDMS

## 2017-05-07 ENCOUNTER — Encounter: Payer: Self-pay | Admitting: Physical Therapy

## 2017-05-07 ENCOUNTER — Ambulatory Visit: Payer: PPO | Admitting: Physical Therapy

## 2017-05-07 ENCOUNTER — Other Ambulatory Visit: Payer: Self-pay | Admitting: Family Medicine

## 2017-05-07 DIAGNOSIS — R600 Localized edema: Secondary | ICD-10-CM

## 2017-05-07 DIAGNOSIS — G8929 Other chronic pain: Secondary | ICD-10-CM

## 2017-05-07 DIAGNOSIS — M25562 Pain in left knee: Secondary | ICD-10-CM | POA: Diagnosis not present

## 2017-05-07 DIAGNOSIS — M25662 Stiffness of left knee, not elsewhere classified: Secondary | ICD-10-CM

## 2017-05-07 LAB — URINE CULTURE
MICRO NUMBER:: 90363217
SPECIMEN QUALITY:: ADEQUATE

## 2017-05-07 MED ORDER — SULFAMETHOXAZOLE-TRIMETHOPRIM 800-160 MG PO TABS: 1.0000 | ORAL_TABLET | Freq: Two times a day (BID) | ORAL | 0 refills | Status: DC

## 2017-05-07 NOTE — Therapy (Signed)
[facility] [place], [place], [address] Phone: [phone]   Fax:  [phone]  Physical Therapy Treatment  Patient Details  Name: [name] MRN: [id number] Date of Birth: [birth year] Referring Provider: Paralee Cancel MD   Encounter Date: 05/07/2017  PT End of Session - 05/07/17 1023    Visit Number  9    Number of Visits  12    Date for PT Re-Evaluation  05/14/17    Authorization Type  FOTO AT LEAST EVERY 5TH VISIT.    PT Start Time  1018    PT Stop Time  1112    PT Time Calculation (min)  54 min    Activity Tolerance  Patient tolerated treatment well    Behavior During Therapy  WFL for tasks assessed/performed       Past Medical History:  Diagnosis Date  . Anxiety   . Arthritis   . Complication of anesthesia   . Depression   . Dysrhythmia    Paroysmal Atrial Tachycardia  . Essential hypertension   . Fibromyalgia   . GERD (gastroesophageal reflux disease)   . Hyperlipemia   . PONV (postoperative nausea and vomiting)   . PSVT (paroxysmal supraventricular tachycardia) ([place])   . Reflux   . Restless leg syndrome   . Type 2 diabetes mellitus ([place])     Past Surgical History:  Procedure Laterality Date  . ABDOMINAL HYSTERECTOMY    . ANKLE SURGERY    . BACK SURGERY     T12-L1 fusion, Anterior Cervical fusion  . CARDIAC CATHETERIZATION N/A 02/16/2015   Procedure: Left Heart Cath and Coronary Angiography;  Surgeon: Sherren Mocha, MD;  Location: [facility];  Service: Cardiovascular;  Laterality: N/A;  . CHOLECYSTECTOMY    . COLONOSCOPY    . ESOPHAGOGASTRODUODENOSCOPY  12/21/2010   Procedure: ESOPHAGOGASTRODUODENOSCOPY (EGD);  Surgeon: Landry Dyke, MD;  Location: [facility];  Service: Endoscopy;  Laterality: N/A;  . EXCISION MORTON'S NEUROMA    . FRACTURE SURGERY    . KNEE SURGERY     x 3  . NECK SURGERY    . ROTATOR CUFF REPAIR    . TONSILLECTOMY    . TOTAL KNEE ARTHROPLASTY Left 04/10/2017    Procedure: LEFT TOTAL KNEE ARTHROPLASTY;  Surgeon: Paralee Cancel, MD;  Location: [facility];  Service: Orthopedics;  Laterality: Left;  70 mins  . UPPER GASTROINTESTINAL ENDOSCOPY    . WRIST SURGERY      There were no vitals filed for this visit.  Subjective Assessment - 05/07/17 1023    Subjective  Reports that the MD states she was negetive for blood clot but just in a fibromyalgia flare up. Reports that she is very fatigued following taking a shower this morning.    Pertinent History  Neck, back, right RTC, left wrist and left ankle surgery.  Athritis and DM.    Patient Stated Goals  I would like to be more mobile, walk better and travel.    Currently in Pain?  Yes    Pain Score  10-Worst pain ever    Pain Location  Knee    Pain Orientation  Left    Pain Descriptors / Indicators  Discomfort    Pain Type  Surgical pain    Pain Onset  1 to 4 weeks ago    Pain Frequency  Constant         [facility] PT Assessment - 05/07/17 0001      Assessment   Medical  Diagnosis  Left total knee replacement.    Onset Date/Surgical Date  04/10/17    Next MD Visit  05/25/2017      Precautions   Precautions  Fall      Restrictions   Weight Bearing Restrictions  No            No data recorded       [facility] Adult PT Treatment/Exercise - 05/07/17 0001      Knee/Hip Exercises: Aerobic   Nustep  L2, seat 7 x16 min      Knee/Hip Exercises: Supine   Short Arc Quad Sets  Strengthening;Left;15 reps    Heel Slides  AROM;Left;15 reps      Modalities   Modalities  Vasopneumatic      Vasopneumatic   Number Minutes Vasopneumatic   10 minutes    Vasopnuematic Location   Knee    Vasopneumatic Pressure  Low    Vasopneumatic Temperature   60      Manual Therapy   Manual Therapy  Soft tissue mobilization    Soft tissue mobilization  Gentle MEM of LLE (from L ankle to L thigh) to reduce edema               PT Short Term Goals - 04/16/17 1447      PT SHORT TERM GOAL #1   Title   STG's=LTG's.        PT Long Term Goals - 04/16/17 1447      PT LONG TERM GOAL #1   Title  Independent with a HEP.    Time  4    Period  Weeks    Status  New      PT LONG TERM GOAL #2   Title  Full active left knee extension in order to normalize gait.    Time  4    Period  Weeks    Status  New      PT LONG TERM GOAL #3   Title  Active knee flexion to 115 degrees+ so the patient can perform functional tasks and do so with pain not > 2-3/10.    Time  4    Period  Weeks    Status  New      PT LONG TERM GOAL #4   Title  Increase left knee strength to a solid 4+/5 to provide good stability for accomplishment of functional activities.    Time  4    Period  Weeks    Status  New      PT LONG TERM GOAL #5   Title  Perform a reciprocating stair gait with one railing with pain not > 2-3/10.    Time  4    Period  Weeks    Status  New      PT LONG TERM GOAL #6   Title  Decrease edema to within 3 cms of non-affected side to assist with pain reduction and range of motion gains.    Time  4    Period  Weeks    Status  New            Plan - 05/07/17 1108    Clinical Impression Statement  Patient tolerated today's treatment fairly well today as she was able to complete more exercises today although gently. Patient able to tolerate gentle edema mobilizations as well from L ankle to L thigh region. Patient noted as small scratch along posterior L knee and had area of redness along inferior L knee  incision. Patient still very tender to palpation along L medial knee. Normal vasopneumatic respone with low pressure and less time. Patient still ambulating with FWW at this time secondary to instability and fear of falling.    Rehab Potential  Excellent    Clinical Impairments Affecting Rehab Potential  [place].    PT Frequency  3x / week    PT Duration  4 weeks    PT Treatment/Interventions  ADLs/Self Care Home Management;Cryotherapy;Electrical Stimulation;Gait training;Stair  training;Functional mobility training;Therapeutic activities;Therapeutic exercise;Neuromuscular re-education;Patient/family education;Passive range of motion;Manual techniques;Vasopneumatic Device    PT Next Visit Plan  Total knee replacment protocol.  Electrical stimulation and vasopneumatic.    PT Home Exercise Plan  supine marching, bridge, SDLY clam, QL stretch in supine, sit to stand (hold bridge if painful)      1 visit left.. More visits?    Consulted and Agree with Plan of Care  Patient       Patient will benefit from skilled therapeutic intervention in order to improve the following deficits and impairments:  Abnormal gait, Decreased activity tolerance, Decreased mobility, Decreased range of motion, Decreased strength, Increased edema, Pain  Visit Diagnosis: Chronic pain of left knee  Stiffness of left knee, not elsewhere classified  Localized edema     Problem List Patient Active Problem List   Diagnosis Date Noted  . S/P left TKA 04/10/2017  . Unstable gait 01/12/2017  . Generalized osteoarthritis of hand 01/12/2017  . RLS (restless legs syndrome) 11/09/2016  . GERD (gastroesophageal reflux disease) 07/13/2016  . Class 2 severe obesity due to excess calories with serious comorbidity and body mass index (BMI) of 36.0 to 36.9 in adult ([place]) 06/01/2016  . Vitamin D deficiency 06/01/2016  . Vitamin B12 deficiency 03/27/2016  . Chronic cough 03/16/2016  . Left foot pain 03/16/2016  . Demand ischemia ([place]) 02/14/2015  . Degenerative drusen 07/13/2013  . Type 2 diabetes mellitus without ophthalmic manifestations ([place]) 07/13/2013  . DM (diabetes mellitus) type II controlled, neurological manifestation ([place]) 05/07/2012  . Paroxysmal atrial tachycardia ([place]) 05/07/2012  . Essential hypertension, benign 05/07/2012  . Depression 05/07/2012  . Hyperlipidemia 05/07/2012  . Fibromyalgia 05/07/2012    Standley Brooking, PTA 05/07/2017, 11:26 AM  [facility] [address] [place], [place], [address] Phone: [phone]   Fax:  [phone]  Name: YAZMINE SOREY MRN: [id number] Date of Birth: 01/02/1941

## 2017-05-09 ENCOUNTER — Encounter: Payer: PPO | Admitting: Physical Therapy

## 2017-05-11 ENCOUNTER — Ambulatory Visit: Payer: PPO | Admitting: *Deleted

## 2017-05-11 DIAGNOSIS — M6281 Muscle weakness (generalized): Secondary | ICD-10-CM

## 2017-05-11 DIAGNOSIS — M25562 Pain in left knee: Secondary | ICD-10-CM | POA: Diagnosis not present

## 2017-05-11 DIAGNOSIS — R600 Localized edema: Secondary | ICD-10-CM

## 2017-05-11 DIAGNOSIS — M25662 Stiffness of left knee, not elsewhere classified: Secondary | ICD-10-CM

## 2017-05-11 DIAGNOSIS — R2681 Unsteadiness on feet: Secondary | ICD-10-CM

## 2017-05-11 DIAGNOSIS — G8929 Other chronic pain: Secondary | ICD-10-CM

## 2017-05-11 NOTE — Therapy (Signed)
[facility] [place], [place], [address] Phone: [phone]   Fax:  [phone]  Physical Therapy Treatment  Patient Details  Name: [name] MRN: [id number] Date of Birth: Sep 14, 1940 Referring Provider: Paralee Cancel MD   Encounter Date: 05/11/2017  PT End of Session - 05/11/17 1040    Visit Number  10    Number of Visits  12    Date for PT Re-Evaluation  05/14/17    Authorization Type  FOTO AT LEAST EVERY 5TH VISIT.    Authorization Time Period  To MD 03-29-17    PT Start Time  1025    PT Stop Time  1124    PT Time Calculation (min)  59 min    Activity Tolerance  Patient tolerated treatment well    Behavior During Therapy  [facility] for tasks assessed/performed       Past Medical History:  Diagnosis Date  . Anxiety   . Arthritis   . Complication of anesthesia   . Depression   . Dysrhythmia    Paroysmal Atrial Tachycardia  . Essential hypertension   . Fibromyalgia   . GERD (gastroesophageal reflux disease)   . Hyperlipemia   . PONV (postoperative nausea and vomiting)   . PSVT (paroxysmal supraventricular tachycardia) ([place])   . Reflux   . Restless leg syndrome   . Type 2 diabetes mellitus ([place])     Past Surgical History:  Procedure Laterality Date  . ABDOMINAL HYSTERECTOMY    . ANKLE SURGERY    . BACK SURGERY     T12-L1 fusion, Anterior Cervical fusion  . CARDIAC CATHETERIZATION N/A 02/16/2015   Procedure: Left Heart Cath and Coronary Angiography;  Surgeon: Sherren Mocha, MD;  Location: [facility];  Service: Cardiovascular;  Laterality: N/A;  . CHOLECYSTECTOMY    . COLONOSCOPY    . ESOPHAGOGASTRODUODENOSCOPY  12/21/2010   Procedure: ESOPHAGOGASTRODUODENOSCOPY (EGD);  Surgeon: Landry Dyke, MD;  Location: [facility];  Service: Endoscopy;  Laterality: N/A;  . EXCISION MORTON'S NEUROMA    . FRACTURE SURGERY    . KNEE SURGERY     x 3  . NECK SURGERY    . ROTATOR CUFF REPAIR    . TONSILLECTOMY     . TOTAL KNEE ARTHROPLASTY Left 04/10/2017   Procedure: LEFT TOTAL KNEE ARTHROPLASTY;  Surgeon: Paralee Cancel, MD;  Location: [facility];  Service: Orthopedics;  Laterality: Left;  70 mins  . UPPER GASTROINTESTINAL ENDOSCOPY    . WRIST SURGERY      There were no vitals filed for this visit.  Subjective Assessment - 05/11/17 1036    Subjective  Reports that the MD states she was negetive for blood clot but just in a fibromyalgia flare up.   Doing better today    Patient is accompained by:  Family member    Pertinent History  Neck, back, right RTC, left wrist and left ankle surgery.  Athritis and DM.    Patient Stated Goals  I would like to be more mobile, walk better and travel.    Pain Score  7     Pain Orientation  Left    Pain Descriptors / Indicators  Discomfort    Pain Type  Surgical pain    Pain Onset  1 to 4 weeks ago    Pain Frequency  Constant                No data recorded       [facility] Adult  PT Treatment/Exercise - 05/11/17 0001      Knee/Hip Exercises: Aerobic   Nustep  L2, seat 7 x16 min      Knee/Hip Exercises: Standing   Forward Lunges  Right;2 sets;10 reps    Rocker Board  3 minutes calf stretch      Knee/Hip Exercises: Seated   Long Arc Quad  Strengthening;10 reps;Weights;3 sets;Both    Long Arc Quad Weight  4 lbs.      Modalities   Modalities  Vasopneumatic      Electrical Stimulation   Electrical Stimulation Location  Left knee.IFC x 15 mins 1-10hz     Electrical Stimulation Goals  Edema;Pain      Vasopneumatic   Number Minutes Vasopneumatic   10 minutes removed early due to RT shin pain    Vasopnuematic Location   Knee    Vasopneumatic Pressure  Low    Vasopneumatic Temperature   60      Manual Therapy   Manual Therapy  Passive ROM    Passive ROM  PROM in supine left knee flexion and extension stretching including patella mobs               PT Short Term Goals - 04/16/17 1447      PT SHORT TERM GOAL #1   Title   STG's=LTG's.        PT Long Term Goals - 04/16/17 1447      PT LONG TERM GOAL #1   Title  Independent with a HEP.    Time  4    Period  Weeks    Status  New      PT LONG TERM GOAL #2   Title  Full active left knee extension in order to normalize gait.    Time  4    Period  Weeks    Status  New      PT LONG TERM GOAL #3   Title  Active knee flexion to 115 degrees+ so the patient can perform functional tasks and do so with pain not > 2-3/10.    Time  4    Period  Weeks    Status  New      PT LONG TERM GOAL #4   Title  Increase left knee strength to a solid 4+/5 to provide good stability for accomplishment of functional activities.    Time  4    Period  Weeks    Status  New      PT LONG TERM GOAL #5   Title  Perform a reciprocating stair gait with one railing with pain not > 2-3/10.    Time  4    Period  Weeks    Status  New      PT LONG TERM GOAL #6   Title  Decrease edema to within 3 cms of non-affected side to assist with pain reduction and range of motion gains.    Time  4    Period  Weeks    Status  New            Plan - 05/11/17 1130    Clinical Impression Statement  Pt arrived today doing a little better with less pain in LT knee, but still hurts from her hip to her toes. She was able to do more today with therex and her ROM was 5-116 degrees PROM. She was tender around the patella and had to limit Vaso due to her shin hurting.      Clinical Presentation  Stable    Rehab Potential  Excellent    Clinical Impairments Affecting Rehab Potential  [place].    PT Frequency  3x / week    PT Duration  4 weeks    PT Treatment/Interventions  ADLs/Self Care Home Management;Cryotherapy;Electrical Stimulation;Gait training;Stair training;Functional mobility training;Therapeutic activities;Therapeutic exercise;Neuromuscular re-education;Patient/family education;Passive range of motion;Manual techniques;Vasopneumatic Device    PT Next Visit Plan  Take FOTO    PT Home  Exercise Plan  supine marching, bridge, SDLY clam, QL stretch in supine, sit to stand (hold bridge if painful)      1 visit left.. More visits?    Consulted and Agree with Plan of Care  Patient       Patient will benefit from skilled therapeutic intervention in order to improve the following deficits and impairments:  Abnormal gait, Decreased activity tolerance, Decreased mobility, Decreased range of motion, Decreased strength, Increased edema, Pain  Visit Diagnosis: Chronic pain of left knee  Stiffness of left knee, not elsewhere classified  Localized edema  Unsteadiness on feet  Muscle weakness (generalized)     Problem List Patient Active Problem List   Diagnosis Date Noted  . S/P left TKA 04/10/2017  . Unstable gait 01/12/2017  . Generalized osteoarthritis of hand 01/12/2017  . RLS (restless legs syndrome) 11/09/2016  . GERD (gastroesophageal reflux disease) 07/13/2016  . Class 2 severe obesity due to excess calories with serious comorbidity and body mass index (BMI) of 36.0 to 36.9 in adult ([place]) 06/01/2016  . Vitamin D deficiency 06/01/2016  . Vitamin B12 deficiency 03/27/2016  . Chronic cough 03/16/2016  . Left foot pain 03/16/2016  . Demand ischemia ([place]) 02/14/2015  . Degenerative drusen 07/13/2013  . Type 2 diabetes mellitus without ophthalmic manifestations ([place]) 07/13/2013  . DM (diabetes mellitus) type II controlled, neurological manifestation ([place]) 05/07/2012  . Paroxysmal atrial tachycardia ([place]) 05/07/2012  . Essential hypertension, benign 05/07/2012  . Depression 05/07/2012  . Hyperlipidemia 05/07/2012  . Fibromyalgia 05/07/2012    Lonn Im,CHRIS, PTA 05/11/2017, 12:22 PM  [facility] [place], [place], [address] Phone: [phone]   Fax:  [phone]  Name: TINZLEE CRAKER MRN: [id number] Date of Birth: 17-Nov-1940

## 2017-05-14 ENCOUNTER — Ambulatory Visit: Payer: Self-pay | Admitting: *Deleted

## 2017-05-14 ENCOUNTER — Encounter: Payer: Self-pay | Admitting: Physical Therapy

## 2017-05-14 ENCOUNTER — Encounter: Payer: Self-pay | Admitting: *Deleted

## 2017-05-14 ENCOUNTER — Ambulatory Visit: Payer: PPO | Attending: Orthopedic Surgery | Admitting: Physical Therapy

## 2017-05-14 DIAGNOSIS — M25562 Pain in left knee: Secondary | ICD-10-CM | POA: Diagnosis not present

## 2017-05-14 DIAGNOSIS — R2681 Unsteadiness on feet: Secondary | ICD-10-CM | POA: Insufficient documentation

## 2017-05-14 DIAGNOSIS — M25662 Stiffness of left knee, not elsewhere classified: Secondary | ICD-10-CM | POA: Insufficient documentation

## 2017-05-14 DIAGNOSIS — M6281 Muscle weakness (generalized): Secondary | ICD-10-CM | POA: Diagnosis not present

## 2017-05-14 DIAGNOSIS — G8929 Other chronic pain: Secondary | ICD-10-CM | POA: Diagnosis not present

## 2017-05-14 DIAGNOSIS — R600 Localized edema: Secondary | ICD-10-CM | POA: Insufficient documentation

## 2017-05-14 NOTE — Telephone Encounter (Signed)
This encounter was created in error - please disregard.

## 2017-05-14 NOTE — Telephone Encounter (Signed)
Pt scheduled for Dr [name] 05/15/17 Will route to Dr [name].

## 2017-05-14 NOTE — Telephone Encounter (Signed)
Pt reports no improvement with UTI S/S since finishing course of Septra DS on Saturday. Reports burning with urination, frequency, delay in starting stream. Denies hematuria, does report bladder "pressure." Unsure of temp but has "chills." During NT, pt hung up from call to answer call from Outpatient Rehab.  Attempted to call back to schedule appt, .Marland KitchenMarland KitchenVM not set up. Will continue to attempt call back.  Reason for Disposition . [1] Taking antibiotic > 72 hours (3 days) for UTI AND [2] painful urination or frequency not improved  Answer Assessment - Initial Assessment Questions 1. ANTIBIOTIC: "What antibiotic are you taking?" "How many times per day?"     Completed course of Septra DS Saturday. Took as CHEN. 2. DURATION: "When was the antibiotic started?"     05/07/17 3. MAIN SYMPTOM: "What is the main symptom you are concerned about?"     Burning, frequency, difficulty initiating stream 4. FEVER: "Do you have a fever?" If so, ask: "What is it, how was it measured, and when did it start?"     No but "chills" 5. OTHER SYMPTOMS: "Do you have any other symptoms?" (e.g., flank pain, vaginal discharge, blood in urine)     Urine dark amber with odor. States is staying hydrated.  Protocols used: URINARY TRACT INFECTION ON ANTIBIOTIC FOLLOW-UP CALL [facility]

## 2017-05-14 NOTE — Telephone Encounter (Signed)
Pt called back and office visit made per protocol. Pt voiced understanding. Home care advice given to patient with verbal understanding and will call back if elevated temp or worsening symptoms.

## 2017-05-14 NOTE — Therapy (Signed)
[facility] [place], [place], [address] Phone: [phone]   Fax:  [phone]  Physical Therapy Treatment  Patient Details  Name: [name] MRN: [id number] Date of Birth: Mar 23, 1940 Referring Provider: Paralee Cancel MD   Encounter Date: 05/14/2017  PT End of Session - 05/14/17 1410    Visit Number  11    Number of Visits  12    Date for PT Re-Evaluation  05/14/17    Authorization Type  FOTO AT LEAST EVERY 5TH VISIT.    PT Start Time  1340    PT Stop Time  1432    PT Time Calculation (min)  52 min    Activity Tolerance  Patient tolerated treatment well    Behavior During Therapy  W[facility] for tasks assessed/performed       Past Medical History:  Diagnosis Date  . Anxiety   . Arthritis   . Complication of anesthesia   . Depression   . Dysrhythmia    Paroysmal Atrial Tachycardia  . Essential hypertension   . Fibromyalgia   . GERD (gastroesophageal reflux disease)   . Hyperlipemia   . PONV (postoperative nausea and vomiting)   . PSVT (paroxysmal supraventricular tachycardia) ([place])   . Reflux   . Restless leg syndrome   . Type 2 diabetes mellitus ([place])     Past Surgical History:  Procedure Laterality Date  . ABDOMINAL HYSTERECTOMY    . ANKLE SURGERY    . BACK SURGERY     T12-L1 fusion, Anterior Cervical fusion  . CARDIAC CATHETERIZATION N/A 02/16/2015   Procedure: Left Heart Cath and Coronary Angiography;  Surgeon: Sherren Mocha, MD;  Location: [facility];  Service: Cardiovascular;  Laterality: N/A;  . CHOLECYSTECTOMY    . COLONOSCOPY    . ESOPHAGOGASTRODUODENOSCOPY  12/21/2010   Procedure: ESOPHAGOGASTRODUODENOSCOPY (EGD);  Surgeon: Landry Dyke, MD;  Location: [facility];  Service: Endoscopy;  Laterality: N/A;  . EXCISION MORTON'S NEUROMA    . FRACTURE SURGERY    . KNEE SURGERY     x 3  . NECK SURGERY    . ROTATOR CUFF REPAIR    . TONSILLECTOMY    . TOTAL KNEE ARTHROPLASTY Left 04/10/2017    Procedure: LEFT TOTAL KNEE ARTHROPLASTY;  Surgeon: Paralee Cancel, MD;  Location: [facility];  Service: Orthopedics;  Laterality: Left;  70 mins  . UPPER GASTROINTESTINAL ENDOSCOPY    . WRIST SURGERY      There were no vitals filed for this visit.  Subjective Assessment - 05/14/17 1353    Subjective  Patient arrived with ongoing 8/10 pain in knee yet overall body feeling better    Pertinent History  Neck, back, right RTC, left wrist and left ankle surgery.  Athritis and DM.    Patient Stated Goals  I would like to be more mobile, walk better and travel.    Currently in Pain?  Yes    Pain Score  8     Pain Location  Knee    Pain Orientation  Left    Pain Descriptors / Indicators  Discomfort    Pain Type  Surgical pain    Pain Onset  More than a month ago    Pain Frequency  Constant    Aggravating Factors   any prolong pressure on LE or certain position    Pain Relieving Factors  at rest         [facility] PT Assessment - 05/14/17 0001  ROM / Strength   AROM / PROM / Strength  AROM;PROM      AROM   AROM Assessment Site  Knee    Right/Left Knee  Left    Left Knee Extension  -5    Left Knee Flexion  110      PROM   PROM Assessment Site  Knee    Right/Left Knee  Left    Left Knee Extension  -3    Left Knee Flexion  115                   [facility] Adult PT Treatment/Exercise - 05/14/17 0001      Knee/Hip Exercises: Stretches   Knee: Self-Stretch to increase Flexion  Left;3 reps;30 seconds      Knee/Hip Exercises: Aerobic   Nustep  27min L4 UE/LE      Knee/Hip Exercises: Standing   Rocker Board  2 minutes      Knee/Hip Exercises: Seated   Long Arc Quad  Strengthening;10 reps;Weights;3 sets;Both    Long Arc Quad Weight  4 lbs.      Knee/Hip Exercises: Supine   Short Arc Estate manager/land agent;Other (comment);Limitations;3 sets;10 reps    Short Arc Quad Sets Limitations  4#    Other Supine Knee/Hip Exercises  hip abd with red t-band  x30        Electrical Stimulation   Electrical Stimulation Location  Left knee.IFC x 15 mins 1-10hz     Electrical Stimulation Goals  Edema;Pain      Manual Therapy   Manual Therapy  Passive ROM    Passive ROM  PROM in supine left knee flexion and extension stretching including patella mobs               PT Short Term Goals - 04/16/17 1447      PT SHORT TERM GOAL #1   Title  STG's=LTG's.        PT Long Term Goals - 05/14/17 1421      PT LONG TERM GOAL #1   Title  Independent with a HEP.    Time  4    Period  Weeks    Status  On-going      PT LONG TERM GOAL #2   Title  Full active left knee extension in order to normalize gait.    Time  4    Period  Weeks    Status  On-going      PT LONG TERM GOAL #3   Title  Active knee flexion to 115 degrees+ so the patient can perform functional tasks and do so with pain not > 2-3/10.    Time  4    Period  Weeks    Status  On-going      PT LONG TERM GOAL #4   Title  Increase left knee strength to a solid 4+/5 to provide good stability for accomplishment of functional activities.    Time  4    Period  Weeks    Status  On-going      PT LONG TERM GOAL #5   Title  Perform a reciprocating stair gait with one railing with pain not > 2-3/10.    Time  4    Period  Weeks    Status  On-going      PT LONG TERM GOAL #6   Title  Decrease edema to within 3 cms of non-affected side to assist with pain reduction and range of motion gains.  Time  4    Period  Weeks    Status  On-going            Plan - 05/14/17 1429    Clinical Impression Statement  Patient tolerated reatment very well today. Patient able to progress left knee strengthening and ROM today. Patient has ongoing pain which limits her from progressing to much per tolerance. Patient still limited with walking, ADL's and increased activity with exercises. FOTO score improved 57% limitation (initial 90%) Goals progressing.     Rehab Potential  Excellent    Clinical  Impairments Affecting Rehab Potential  [place]. FOTO 13 visit 57%    PT Frequency  3x / week    PT Duration  4 weeks    PT Treatment/Interventions  ADLs/Self Care Home Management;Cryotherapy;Electrical Stimulation;Gait training;Stair training;Functional mobility training;Therapeutic activities;Therapeutic exercise;Neuromuscular re-education;Patient/family education;Passive range of motion;Manual techniques;Vasopneumatic Device    PT Next Visit Plan  cont with POC per patient tolerance    Consulted and Agree with Plan of Care  Patient       Patient will benefit from skilled therapeutic intervention in order to improve the following deficits and impairments:  Abnormal gait, Decreased activity tolerance, Decreased mobility, Decreased range of motion, Decreased strength, Increased edema, Pain  Visit Diagnosis: Chronic pain of left knee  Stiffness of left knee, not elsewhere classified  Localized edema     Problem List Patient Active Problem List   Diagnosis Date Noted  . S/P left TKA 04/10/2017  . Unstable gait 01/12/2017  . Generalized osteoarthritis of hand 01/12/2017  . RLS (restless legs syndrome) 11/09/2016  . GERD (gastroesophageal reflux disease) 07/13/2016  . Class 2 severe obesity due to excess calories with serious comorbidity and body mass index (BMI) of 36.0 to 36.9 in adult ([place]) 06/01/2016  . Vitamin D deficiency 06/01/2016  . Vitamin B12 deficiency 03/27/2016  . Chronic cough 03/16/2016  . Left foot pain 03/16/2016  . Demand ischemia ([place]) 02/14/2015  . Degenerative drusen 07/13/2013  . Type 2 diabetes mellitus without ophthalmic manifestations ([place]) 07/13/2013  . DM (diabetes mellitus) type II controlled, neurological manifestation ([place]) 05/07/2012  . Paroxysmal atrial tachycardia ([place]) 05/07/2012  . Essential hypertension, benign 05/07/2012  . Depression 05/07/2012  . Hyperlipidemia 05/07/2012  . Fibromyalgia 05/07/2012    Ladean Raya, PTA 05/14/17  2:42 PM  [facility] [place], [place], [address] Phone: [phone]   Fax:  [phone]  Name: ANIA LEVAY MRN: [id number] Date of Birth: 22-Jan-1941

## 2017-05-15 ENCOUNTER — Ambulatory Visit (INDEPENDENT_AMBULATORY_CARE_PROVIDER_SITE_OTHER): Payer: PPO | Admitting: Family Medicine

## 2017-05-15 ENCOUNTER — Other Ambulatory Visit: Payer: Self-pay | Admitting: Family Medicine

## 2017-05-15 ENCOUNTER — Encounter: Payer: Self-pay | Admitting: Family Medicine

## 2017-05-15 VITALS — BP 123/80 | HR 84 | Temp 97.9°F | Resp 12 | Ht 63.5 in | Wt 199.0 lb

## 2017-05-15 DIAGNOSIS — B3731 Acute candidiasis of vulva and vagina: Secondary | ICD-10-CM

## 2017-05-15 DIAGNOSIS — R300 Dysuria: Secondary | ICD-10-CM | POA: Diagnosis not present

## 2017-05-15 DIAGNOSIS — B373 Candidiasis of vulva and vagina: Secondary | ICD-10-CM

## 2017-05-15 MED ORDER — NYSTATIN 100000 UNIT/GM EX CREA: 1.0000 "application " | TOPICAL_CREAM | Freq: Two times a day (BID) | CUTANEOUS | 1 refills | Status: DC

## 2017-05-15 MED ORDER — NYSTATIN-TRIAMCINOLONE 100000-0.1 UNIT/GM-% EX CREA: 1.0000 "application " | TOPICAL_CREAM | Freq: Two times a day (BID) | CUTANEOUS | 0 refills | Status: DC

## 2017-05-15 MED ORDER — NYSTATIN-TRIAMCINOLONE 100000-0.1 UNIT/GM-% EX CREA: 1.0000 "application " | TOPICAL_CREAM | Freq: Two times a day (BID) | CUTANEOUS | 0 refills | Status: AC

## 2017-05-15 MED ORDER — FLUCONAZOLE 150 MG PO TABS: 150.0000 mg | ORAL_TABLET | Freq: Once | ORAL | 0 refills | Status: AC

## 2017-05-15 NOTE — Progress Notes (Signed)
HPI:   [name] is a 77 y.o. female, who is here today with her friend complaining persistent dysuria.  She was seen here in the office on 05/04/17 because dysuria and new onset of urine incontinence that presented after knee surgery.  UCx 05/04/17 grew E coli.  She was treated with Septra DS 1 tablet twice daily for 5 days.  She completed antibiotic treatment and concerned because still having "burning" sensation that is constant but worse with urination.  She has also noted erythema and pruritus on external genitalia.   Dysuria: Yes Urinary frequency: Yes Urinary urgency: Yes Incontinence:Improving. Hematuria: She has seen some blood on tissue, attributed to hemorrhoids.  Abdominal pain: No Nausea or vomiting: No Abnormal vaginal bleeding or discharge: No  She has not had fever, chills, worsening fatigue.  Hx of UTI: In the past she was following with urologist, according to patient low-dose daily antibiotic was recommended but she decided not to do it.  OTC medications for this problem: None   Review of Systems  Constitutional: Positive for fatigue. Negative for activity change, appetite change and fever.  Gastrointestinal: Negative for abdominal pain, constipation, diarrhea, nausea and vomiting.  Genitourinary: Positive for dysuria and frequency. Negative for decreased urine volume, hematuria, urgency, vaginal bleeding and vaginal discharge.  Musculoskeletal: Positive for arthralgias and back pain (Chronic).  Skin: Positive for rash. Negative for wound.  Psychiatric/Behavioral: Negative for confusion. The patient is nervous/anxious.       Current Outpatient Medications on File Prior to Visit  Medication Sig Dispense Refill  . aspirin (ASPIRIN CHILDRENS) 81 MG chewable tablet Chew 1 tablet (81 mg total) by mouth 2 (two) times daily. Start the day after finishing Xarelto. Take for 4 weeks, then resume regular dose. 60 tablet 0  . Calcium  Citrate-Vitamin D (CALCIUM CITRATE +D PO) Take 1 tablet by mouth at bedtime.     . cholecalciferol (VITAMIN D) 1000 units tablet Take 1,000 Units by mouth daily.    . Cranberry 1000 MG CAPS Take 1 capsule by mouth 2 (two) times daily. + Vit C 40 mg    . Cyanocobalamin (VITAMIN B-12 PO) Take 1 tablet by mouth daily.    . digoxin (LANOXIN) 0.125 MG tablet Take 0.125 mg by mouth daily.     Marland Kitchen ezetimibe (ZETIA) 10 MG tablet Take 1 tablet (10 mg total) by mouth daily. 90 tablet 2  . insulin glargine (LANTUS) 100 UNIT/ML injection Inject 0.6 mLs (60 Units total) into the skin at bedtime. 10 mL 3  . loperamide (IMODIUM A-D) 2 MG tablet Take 2 mg by mouth 4 (four) times daily as needed for diarrhea or loose stools.    . metFORMIN (GLUCOPHAGE) 1000 MG tablet Take 1,000 mg by mouth 2 (two) times daily with a meal.     . metoprolol succinate (TOPROL-XL) 25 MG 24 hr tablet Take 1 tablet (25 mg total) by mouth daily. 90 tablet 1  . Multiple Vitamins-Minerals (PRESERVISION AREDS 2 PO) Take 1 capsule by mouth 2 (two) times daily.    . nitroGLYCERIN (NITROSTAT) 0.4 MG SL tablet Place 0.4 mg under the tongue every 5 (five) minutes as needed. As needed chest pain    . omeprazole (PRILOSEC) 20 MG capsule Take 1 capsule (20 mg total) by mouth daily. 90 capsule 2  . ramipril (ALTACE) 10 MG capsule TAKE 1 CAPSULE BY MOUTH ONCE DAILY 90 capsule 0  . RELION INSULIN SYRINGE 1ML/31G 31G X 5/16" 1 ML MISC  Use as directed 100 each 1  . rOPINIRole (REQUIP) 1 MG tablet Take 1 tablet (1 mg total) by mouth at bedtime. 90 tablet 1  . sertraline (ZOLOFT) 100 MG tablet Take 1 tablet (100 mg total) by mouth daily. 30 tablet 4  . verapamil (VERELAN PM) 120 MG 24 hr capsule Take 120 mg by mouth daily as needed (tachycardia).     . verapamil (VERELAN PM) 240 MG 24 hr capsule Take 1 capsule (240 mg total) by mouth daily. 30 capsule 2  . docusate sodium (COLACE) 100 MG capsule Take 1 capsule (100 mg total) by mouth 2 (two) times daily.  (Patient not taking: Reported on 05/15/2017) 10 capsule 0  . ferrous sulfate (FERROUSUL) 325 (65 FE) MG tablet Take 1 tablet (325 mg total) by mouth 3 (three) times daily with meals. (Patient not taking: Reported on 05/15/2017)  3  . HYDROcodone-acetaminophen (NORCO) 7.5-325 MG tablet Take 1-2 tablets by mouth every 4 (four) hours as needed for moderate pain. (Patient not taking: Reported on 05/04/2017) 60 tablet 0  . methocarbamol (ROBAXIN) 500 MG tablet Take 1 tablet (500 mg total) by mouth every 6 (six) hours as needed for muscle spasms. (Patient not taking: Reported on 05/15/2017) 40 tablet 0  . polyethylene glycol (MIRALAX / GLYCOLAX) packet Take 17 g by mouth 2 (two) times daily. (Patient not taking: Reported on 05/15/2017) 14 each 0  . rivaroxaban (XARELTO) 10 MG TABS tablet Take 1 tablet (10 mg total) by mouth daily for 14 days. 14 tablet 0  . sulfamethoxazole-trimethoprim (BACTRIM DS,SEPTRA DS) 800-160 MG tablet Take 1 tablet by mouth 2 (two) times daily. X 5 DAYS (Patient not taking: Reported on 05/15/2017) 10 tablet 0   No current facility-administered medications on file prior to visit.      Past Medical History:  Diagnosis Date  . Anxiety   . Arthritis   . Complication of anesthesia   . Depression   . Dysrhythmia    Paroysmal Atrial Tachycardia  . Essential hypertension   . Fibromyalgia   . GERD (gastroesophageal reflux disease)   . Hyperlipemia   . PONV (postoperative nausea and vomiting)   . PSVT (paroxysmal supraventricular tachycardia) ([facility])   . Reflux   . Restless leg syndrome   . Type 2 diabetes mellitus ([facility])    Allergies  Allergen Reactions  . Amoxil [Amoxicillin] Swelling and Other (See Comments)    Lips Has patient had a PCN reaction causing immediate rash, facial/tongue/throat swelling, SOB or lightheadedness with hypotension:YES Has patient had a PCN reaction causing severe rash involving mucus membranes or skin necrosis:No Has patient had a PCN reaction that  required hospitalization:No Has patient had a PCN reaction occurring within the last 10 years:Yes. If all of the above answers are "NO", then may proceed with Cephalosporin use.   . Chocolate Hives  . Demerol Nausea And Vomiting  . Lisinopril Itching and Swelling  . Meperidine Hcl     Social History   Socioeconomic History  . Marital status: Widowed    Spouse name: Glendell Docker  . Number of children: 2  . Years of education: Not on file  . Highest education level: Not on file  Occupational History  . Not on file  Social Needs  . Financial resource strain: Not on file  . Food insecurity:    Worry: Not on file    Inability: Not on file  . Transportation needs:    Medical: Not on file    Non-medical: Not on  file  Tobacco Use  . Smoking status: Never Smoker  . Smokeless tobacco: Never Used  Substance and Sexual Activity  . Alcohol use: No  . Drug use: No  . Sexual activity: Not on file  Lifestyle  . Physical activity:    Days per week: Not on file    Minutes per session: Not on file  . Stress: Not on file  Relationships  . Social connections:    Talks on phone: Not on file    Gets together: Not on file    Attends religious service: Not on file    Active member of club or organization: Not on file    Attends meetings of clubs or organizations: Not on file    Relationship status: Not on file  Other Topics Concern  . Not on file  Social History Narrative   Lives with husband.  Independent of ADLs.    Vitals:   05/15/17 0939  BP: 123/80  Pulse: 84  Resp: 12  Temp: 97.9 F (36.6 C)  SpO2: 97%   Body mass index is 34.7 kg/m.   Physical Exam  Nursing note and vitals reviewed. Constitutional: She is oriented to person, place, and time. She appears well-developed. No distress.  HENT:  Head: Normocephalic and atraumatic.  Eyes: Conjunctivae are normal.  Respiratory: Effort normal and breath sounds normal. No respiratory distress.  GI: Soft. She exhibits no mass.  There is no tenderness. There is no CVA tenderness.  Genitourinary: There is rash on the right labia. There is no tenderness or lesion on the right labia. There is rash on the left labia. There is no tenderness or lesion on the left labia. There is erythema in the vagina.  Genitourinary Comments: Upon inspection of external genitalia noted mild edema. No tenderness. + Erythema of major and minor labia as well as vaginal introitus.  I do not appreciate vaginal discharge or bleeding.  Lymphadenopathy:    She has no cervical adenopathy.  Neurological: She is alert and oriented to person, place, and time. She has normal strength.  Antalgic gait.  Skin: Skin is warm. No rash noted. There is erythema.  Psychiatric: Her speech is normal. Her mood appears anxious.  Well-groomed, good eye contact.    ASSESSMENT AND PLAN:   Ms. Lashaunda was seen today for urinary tract infection.  Diagnoses and all orders for this visit:  Dysuria  We discussed possible etiologies. Recently treated as UTI with Septra with positive susceptibility according to Ucx. She is having constant dysuria, which I think is caused by vulvovaginitis.  She agrees with holding on more antibiotic treatment at this time, she is aware of side effects.  Vulvovaginal candidiasis  Recommend using topical Mycolog twice daily for up to 14 days, then she can continue with nystatin cream. Diflucan 150 mg once. Some other recommendations that might help were discussed. Follow-up as needed.  -     nystatin-triamcinolone (MYCOLOG II) cream; Apply 1 application topically 2 (two) times daily for 14 days. -     nystatin cream (MYCOSTATIN); Apply 1 application topically 2 (two) times daily. -     fluconazole (DIFLUCAN) 150 MG tablet; Take 1 tablet (150 mg total) by mouth once for 1 dose.    -Ms.PRECILLA PURNELL was advised to return or notify a doctor immediately if symptoms worsen or persist or new concerns arise.       Carita Sollars G.  [name], MD  [facility]. [place] office.

## 2017-05-15 NOTE — Telephone Encounter (Signed)
Copied from [place] [id number]. Topic: Quick Communication - Rx Refill/Question >> May 15, 2017  3:39 PM Neva Seat wrote: Nystatin cream 30 gram tube Nystatin/triamcinolone cream 30 gram tube  Call into: [facility] - Phone: [phone]  [facility] didn't have the Rx's instock

## 2017-05-15 NOTE — Patient Instructions (Addendum)
A few things to remember from today's visit:   Vulvovaginal candidiasis - Plan: nystatin-triamcinolone (MYCOLOG II) cream, nystatin cream (MYCOSTATIN), fluconazole (DIFLUCAN) 150 MG tablet  Dysuria  Diflucan sent to the pharmacy, take 1 tablet and can repeat in a week if still having symptoms. I sent to creams, use day 1 with a steroid first.  The nystatin cream can be used as needed after.   Please be sure medication list is accurate. If a new problem present, please set up appointment sooner than planned today.

## 2017-05-16 ENCOUNTER — Other Ambulatory Visit: Payer: Self-pay | Admitting: *Deleted

## 2017-05-16 ENCOUNTER — Encounter: Payer: PPO | Admitting: Physical Therapy

## 2017-05-16 ENCOUNTER — Telehealth: Payer: Self-pay | Admitting: Family Medicine

## 2017-05-16 MED ORDER — RAMIPRIL 10 MG PO CAPS: 10.0000 mg | ORAL_CAPSULE | Freq: Every day | ORAL | 0 refills | Status: DC

## 2017-05-16 NOTE — Telephone Encounter (Signed)
Rx refilled per protocol: LOV: 04/09/17

## 2017-05-16 NOTE — Telephone Encounter (Signed)
Copied from [place] [id number]. Topic: Quick Communication - Rx Refill/Question >> May 16, 2017  2:52 PM Boyd Kerbs wrote:  Medication: ramipril (ALTACE) 10 MG capsule This is a new pharmacy Pt. Is out  This was sent to office a week ago and no response  Has the patient contacted their pharmacy? Yes.    (Agent: If no, request that the patient contact the pharmacy for the refill.)  Preferred Pharmacy (with phone number or street name):   [facility], [place] - [address] [place] [address] Phone: [phone] Fax: [phone]   Agent: Please be advised that RX refills may take up to 3 business days. We ask that you follow-up with your pharmacy.

## 2017-05-18 ENCOUNTER — Encounter: Payer: PPO | Admitting: *Deleted

## 2017-05-21 ENCOUNTER — Ambulatory Visit: Payer: PPO | Admitting: Physical Therapy

## 2017-05-21 DIAGNOSIS — M25562 Pain in left knee: Secondary | ICD-10-CM | POA: Diagnosis not present

## 2017-05-21 DIAGNOSIS — G8929 Other chronic pain: Secondary | ICD-10-CM

## 2017-05-21 NOTE — Therapy (Signed)
[facility] [place], [place], [address] Phone: [phone]   Fax:  [phone]  Physical Therapy Treatment  Patient Details  Name: [name] MRN: [id number] Date of Birth: Dec 19, 1940 Referring Provider: Paralee Cancel MD   Encounter Date: 05/21/2017  PT End of Session - 05/21/17 1432    Visit Number  12    Number of Visits  18    Date for PT Re-Evaluation  06/11/17    Authorization Type  FOTO AT LEAST EVERY 5TH VISIT.    PT Start Time  1430    PT Stop Time  1528    PT Time Calculation (min)  58 min    Activity Tolerance  Patient tolerated treatment well    Behavior During Therapy  WFL for tasks assessed/performed       Past Medical History:  Diagnosis Date  . Anxiety   . Arthritis   . Complication of anesthesia   . Depression   . Dysrhythmia    Paroysmal Atrial Tachycardia  . Essential hypertension   . Fibromyalgia   . GERD (gastroesophageal reflux disease)   . Hyperlipemia   . PONV (postoperative nausea and vomiting)   . PSVT (paroxysmal supraventricular tachycardia) ([facility])   . Reflux   . Restless leg syndrome   . Type 2 diabetes mellitus ([facility])     Past Surgical History:  Procedure Laterality Date  . ABDOMINAL HYSTERECTOMY    . ANKLE SURGERY    . BACK SURGERY     T12-L1 fusion, Anterior Cervical fusion  . CARDIAC CATHETERIZATION N/A 02/16/2015   Procedure: Left Heart Cath and Coronary Angiography;  Surgeon: Sherren Mocha, MD;  Location: [facility];  Service: Cardiovascular;  Laterality: N/A;  . CHOLECYSTECTOMY    . COLONOSCOPY    . ESOPHAGOGASTRODUODENOSCOPY  12/21/2010   Procedure: ESOPHAGOGASTRODUODENOSCOPY (EGD);  Surgeon: Landry Dyke, MD;  Location: [facility];  Service: Endoscopy;  Laterality: N/A;  . EXCISION MORTON'S NEUROMA    . FRACTURE SURGERY    . KNEE SURGERY     x 3  . NECK SURGERY    . ROTATOR CUFF REPAIR    . TONSILLECTOMY    . TOTAL KNEE ARTHROPLASTY Left 04/10/2017    Procedure: LEFT TOTAL KNEE ARTHROPLASTY;  Surgeon: Paralee Cancel, MD;  Location: [facility];  Service: Orthopedics;  Laterality: Left;  70 mins  . UPPER GASTROINTESTINAL ENDOSCOPY    . WRIST SURGERY      There were no vitals filed for this visit.  Subjective Assessment - 05/21/17 1432    Subjective  Patient reports 7/10 pain in knee and hips today. She says somedays the knee feels better, somedays it feels like she's taking four steps backward.    Patient Stated Goals  I would like to be more mobile, walk better and travel.    Currently in Pain?  Yes    Pain Score  7     Pain Location  Knee    Pain Orientation  Left    Pain Descriptors / Indicators  Discomfort    Pain Type  Surgical pain    Pain Onset  More than a month ago                       [facility] Adult PT Treatment/Exercise - 05/21/17 0001      Ambulation/Gait   Stairs  Yes    Stairs Assistance  6: Modified independent (Device/Increase time)    Stair Management  Technique  One rail Right;Step to pattern;Alternating pattern;Sideways    Number of Stairs  4 x 6 reps    Height of Stairs  6      Knee/Hip Exercises: Aerobic   Nustep  60min L4 UE/LE      Knee/Hip Exercises: Machines for Strengthening   Cybex Leg Press  1 plate 3x10      Knee/Hip Exercises: Standing   Heel Raises  Both;3 sets;10 reps    Forward Step Up  Left;20 reps;Hand Hold: 1;Step Height: 8" reverse step up to 6 in with L x 10    Step Down  Right;3 sets;10 reps;Step Height: 6";Hand Hold: 1      Modalities   Modalities  Electrical engineer Stimulation Location  L knee IFC 80-150 Hz x 15 nub    Electrical Stimulation Goals  Pain               PT Short Term Goals - 04/16/17 1447      PT SHORT TERM GOAL #1   Title  STG's=LTG's.        PT Long Term Goals - 05/14/17 1421      PT LONG TERM GOAL #1   Title  Independent with a HEP.    Time  4    Period  Weeks    Status  On-going       PT LONG TERM GOAL #2   Title  Full active left knee extension in order to normalize gait.    Time  4    Period  Weeks    Status  On-going      PT LONG TERM GOAL #3   Title  Active knee flexion to 115 degrees+ so the patient can perform functional tasks and do so with pain not > 2-3/10.    Time  4    Period  Weeks    Status  On-going      PT LONG TERM GOAL #4   Title  Increase left knee strength to a solid 4+/5 to provide good stability for accomplishment of functional activities.    Time  4    Period  Weeks    Status  On-going      PT LONG TERM GOAL #5   Title  Perform a reciprocating stair gait with one railing with pain not > 2-3/10.    Time  4    Period  Weeks    Status  On-going      PT LONG TERM GOAL #6   Title  Decrease edema to within 3 cms of non-affected side to assist with pain reduction and range of motion gains.    Time  4    Period  Weeks    Status  On-going            Plan - 05/21/17 1515    Clinical Impression Statement  Patient did well with TE today. She is able to do a reciprocal gait on stairs with encouragement and is primarily reluctant due to fear since she hasn't used this gait pattern in years. She demonstrated ample strength.    PT Treatment/Interventions  ADLs/Self Care Home Management;Cryotherapy;Electrical Stimulation;Gait training;Stair training;Functional mobility training;Therapeutic activities;Therapeutic exercise;Neuromuscular re-education;Patient/family education;Passive range of motion;Manual techniques;Vasopneumatic Device    PT Next Visit Plan  Continue with stairs, functionaly strengthening.       Patient will benefit from skilled therapeutic intervention in order to improve the following deficits and impairments:  Abnormal gait, Decreased activity tolerance, Decreased mobility, Decreased range of motion, Decreased strength, Increased edema, Pain  Visit Diagnosis: Chronic pain of left knee     Problem List Patient Active  Problem List   Diagnosis Date Noted  . S/P left TKA 04/10/2017  . Unstable gait 01/12/2017  . Generalized osteoarthritis of hand 01/12/2017  . RLS (restless legs syndrome) 11/09/2016  . GERD (gastroesophageal reflux disease) 07/13/2016  . Class 2 severe obesity due to excess calories with serious comorbidity and body mass index (BMI) of 36.0 to 36.9 in adult ([facility]) 06/01/2016  . Vitamin D deficiency 06/01/2016  . Vitamin B12 deficiency 03/27/2016  . Chronic cough 03/16/2016  . Left foot pain 03/16/2016  . Demand ischemia ([facility]) 02/14/2015  . Degenerative drusen 07/13/2013  . Type 2 diabetes mellitus without ophthalmic manifestations ([place]) 07/13/2013  . DM (diabetes mellitus) type II controlled, neurological manifestation ([facility]) 05/07/2012  . Paroxysmal atrial tachycardia ([place]) 05/07/2012  . Essential hypertension, benign 05/07/2012  . Depression 05/07/2012  . Hyperlipidemia 05/07/2012  . Fibromyalgia 05/07/2012    Beza Steppe PT 05/21/2017, 3:36 PM  [facility] [address] [place], [place], [address] Phone: [phone]   Fax:  [phone]  Name: VERNAL RUTAN MRN: [id number] Date of Birth: [birth year]

## 2017-05-24 ENCOUNTER — Ambulatory Visit: Payer: PPO | Admitting: Physical Therapy

## 2017-05-24 ENCOUNTER — Encounter: Payer: Self-pay | Admitting: Physical Therapy

## 2017-05-24 DIAGNOSIS — G8929 Other chronic pain: Secondary | ICD-10-CM

## 2017-05-24 DIAGNOSIS — M25562 Pain in left knee: Principal | ICD-10-CM

## 2017-05-24 DIAGNOSIS — M25662 Stiffness of left knee, not elsewhere classified: Secondary | ICD-10-CM

## 2017-05-24 DIAGNOSIS — R600 Localized edema: Secondary | ICD-10-CM

## 2017-05-24 NOTE — Therapy (Signed)
[facility] [place], [place], [address] Phone: [phone]   Fax:  [phone]  Physical Therapy Treatment  Patient Details  Name: [name] MRN: [id number] Date of Birth: Mar 09, 1940 Referring Provider: Paralee Cancel MD   Encounter Date: 05/24/2017  PT End of Session - 05/24/17 1402    Visit Number  13    Number of Visits  18    Date for PT Re-Evaluation  06/11/17    Authorization Type  FOTO AT LEAST EVERY 5TH VISIT.    Authorization Time Period  To MD 03-29-17    PT Start Time  0145    PT Stop Time  0232    PT Time Calculation (min)  47 min    Activity Tolerance  Patient tolerated treatment well    Behavior During Therapy  [facility] for tasks assessed/performed       Past Medical History:  Diagnosis Date  . Anxiety   . Arthritis   . Complication of anesthesia   . Depression   . Dysrhythmia    Paroysmal Atrial Tachycardia  . Essential hypertension   . Fibromyalgia   . GERD (gastroesophageal reflux disease)   . Hyperlipemia   . PONV (postoperative nausea and vomiting)   . PSVT (paroxysmal supraventricular tachycardia) ([place])   . Reflux   . Restless leg syndrome   . Type 2 diabetes mellitus ([place])     Past Surgical History:  Procedure Laterality Date  . ABDOMINAL HYSTERECTOMY    . ANKLE SURGERY    . BACK SURGERY     T12-L1 fusion, Anterior Cervical fusion  . CARDIAC CATHETERIZATION N/A 02/16/2015   Procedure: Left Heart Cath and Coronary Angiography;  Surgeon: Sherren Mocha, MD;  Location: [facility];  Service: Cardiovascular;  Laterality: N/A;  . CHOLECYSTECTOMY    . COLONOSCOPY    . ESOPHAGOGASTRODUODENOSCOPY  12/21/2010   Procedure: ESOPHAGOGASTRODUODENOSCOPY (EGD);  Surgeon: Landry Dyke, MD;  Location: [facility];  Service: Endoscopy;  Laterality: N/A;  . EXCISION MORTON'S NEUROMA    . FRACTURE SURGERY    . KNEE SURGERY     x 3  . NECK SURGERY    . ROTATOR CUFF REPAIR    . TONSILLECTOMY     . TOTAL KNEE ARTHROPLASTY Left 04/10/2017   Procedure: LEFT TOTAL KNEE ARTHROPLASTY;  Surgeon: Paralee Cancel, MD;  Location: [facility];  Service: Orthopedics;  Laterality: Left;  70 mins  . UPPER GASTROINTESTINAL ENDOSCOPY    . WRIST SURGERY      There were no vitals filed for this visit.  Subjective Assessment - 05/24/17 1402    Subjective  My right hip is hurting.  I went to get up on a table at my doctors office and felt intense pain in my right hip and nearly fell over.    Patient Stated Goals  I would like to be more mobile, walk better and travel.    Pain Score  6     Pain Location  Knee    Pain Orientation  Left    Pain Descriptors / Indicators  Discomfort    Pain Type  Surgical pain    Pain Onset  More than a month ago                       [facility] Adult PT Treatment/Exercise - 05/24/17 0001      Exercises   Exercises  Knee/Hip      Knee/Hip Exercises: Aerobic  Stationary Bike  Level 1 x 15 minutes.      Acupuncturist Location  Left knee    Electrical Stimulation Action  IFC    Electrical Stimulation Parameters  1-10 hz x 15 minutes.      Vasopneumatic   Number Minutes Vasopneumatic   15 minutes    Vasopnuematic Location   -- Left knee.    Vasopneumatic Pressure  Medium      Manual Therapy   Manual Therapy  Passive ROM    Passive ROM  PROM x 8 minutes into left knee flexion and extension also included patellar mobs.               PT Short Term Goals - 04/16/17 1447      PT SHORT TERM GOAL #1   Title  STG's=LTG's.        PT Long Term Goals - 05/14/17 1421      PT LONG TERM GOAL #1   Title  Independent with a HEP.    Time  4    Period  Weeks    Status  On-going      PT LONG TERM GOAL #2   Title  Full active left knee extension in order to normalize gait.    Time  4    Period  Weeks    Status  On-going      PT LONG TERM GOAL #3   Title  Active knee flexion to 115 degrees+ so the patient can  perform functional tasks and do so with pain not > 2-3/10.    Time  4    Period  Weeks    Status  On-going      PT LONG TERM GOAL #4   Title  Increase left knee strength to a solid 4+/5 to provide good stability for accomplishment of functional activities.    Time  4    Period  Weeks    Status  On-going      PT LONG TERM GOAL #5   Title  Perform a reciprocating stair gait with one railing with pain not > 2-3/10.    Time  4    Period  Weeks    Status  On-going      PT LONG TERM GOAL #6   Title  Decrease edema to within 3 cms of non-affected side to assist with pain reduction and range of motion gains.    Time  4    Period  Weeks    Status  On-going            Plan - 05/24/17 1445    Clinical Impression Statement  Excellent progress overall with active left knee flexion to 125 degrees and passive to 130 degrees and passive left knee extension to -5 degrees.       Patient will benefit from skilled therapeutic intervention in order to improve the following deficits and impairments:     Visit Diagnosis: Chronic pain of left knee  Stiffness of left knee, not elsewhere classified  Localized edema     Problem List Patient Active Problem List   Diagnosis Date Noted  . S/P left TKA 04/10/2017  . Unstable gait 01/12/2017  . Generalized osteoarthritis of hand 01/12/2017  . RLS (restless legs syndrome) 11/09/2016  . GERD (gastroesophageal reflux disease) 07/13/2016  . Class 2 severe obesity due to excess calories with serious comorbidity and body mass index (BMI) of 36.0 to 36.9 in adult ([place]) 06/01/2016  .  Vitamin D deficiency 06/01/2016  . Vitamin B12 deficiency 03/27/2016  . Chronic cough 03/16/2016  . Left foot pain 03/16/2016  . Demand ischemia ([place]) 02/14/2015  . Degenerative drusen 07/13/2013  . Type 2 diabetes mellitus without ophthalmic manifestations ([place]) 07/13/2013  . DM (diabetes mellitus) type II controlled, neurological manifestation ([place]) 05/07/2012   . Paroxysmal atrial tachycardia ([place]) 05/07/2012  . Essential hypertension, benign 05/07/2012  . Depression 05/07/2012  . Hyperlipidemia 05/07/2012  . Fibromyalgia 05/07/2012    Shyleigh Daughtry, [place] MPT 05/24/2017, 2:51 PM  [facility] [address] [place], [place], [address] Phone: [phone]   Fax:  [phone]  Name: [name] MRN: [id number] Date of Birth: 1940-05-06

## 2017-05-25 DIAGNOSIS — Z96652 Presence of left artificial knee joint: Secondary | ICD-10-CM | POA: Diagnosis not present

## 2017-05-25 DIAGNOSIS — Z471 Aftercare following joint replacement surgery: Secondary | ICD-10-CM | POA: Diagnosis not present

## 2017-05-28 ENCOUNTER — Ambulatory Visit: Payer: PPO | Admitting: Family Medicine

## 2017-05-29 ENCOUNTER — Ambulatory Visit: Payer: PPO | Admitting: *Deleted

## 2017-05-29 DIAGNOSIS — M6281 Muscle weakness (generalized): Secondary | ICD-10-CM

## 2017-05-29 DIAGNOSIS — R600 Localized edema: Secondary | ICD-10-CM

## 2017-05-29 DIAGNOSIS — M25562 Pain in left knee: Principal | ICD-10-CM

## 2017-05-29 DIAGNOSIS — R2681 Unsteadiness on feet: Secondary | ICD-10-CM

## 2017-05-29 DIAGNOSIS — M25662 Stiffness of left knee, not elsewhere classified: Secondary | ICD-10-CM

## 2017-05-29 DIAGNOSIS — G8929 Other chronic pain: Secondary | ICD-10-CM

## 2017-05-29 NOTE — Therapy (Signed)
[facility] [place], [place], [address] Phone: [phone]   Fax:  [phone]  Physical Therapy Treatment  Patient Details  Name: [name] MRN: [id number] Date of Birth: [birth year] Referring Provider: Paralee Cancel MD   Encounter Date: 05/29/2017  PT End of Session - 05/29/17 1412    Visit Number  14    Number of Visits  18    Date for PT Re-Evaluation  06/11/17    Authorization Type  FOTO AT LEAST EVERY 5TH VISIT.    Authorization Time Period  To MD 03-29-17    PT Start Time  1405 Pt 20 mins late    PT Stop Time  1447    PT Time Calculation (min)  42 min    Activity Tolerance  Patient tolerated treatment well    Behavior During Therapy  WFL for tasks assessed/performed       Past Medical History:  Diagnosis Date  . Anxiety   . Arthritis   . Complication of anesthesia   . Depression   . Dysrhythmia    Paroysmal Atrial Tachycardia  . Essential hypertension   . Fibromyalgia   . GERD (gastroesophageal reflux disease)   . Hyperlipemia   . PONV (postoperative nausea and vomiting)   . PSVT (paroxysmal supraventricular tachycardia) ([place])   . Reflux   . Restless leg syndrome   . Type 2 diabetes mellitus ([place])     Past Surgical History:  Procedure Laterality Date  . ABDOMINAL HYSTERECTOMY    . ANKLE SURGERY    . BACK SURGERY     T12-L1 fusion, Anterior Cervical fusion  . CARDIAC CATHETERIZATION N/A 02/16/2015   Procedure: Left Heart Cath and Coronary Angiography;  Surgeon: Sherren Mocha, MD;  Location: [facility];  Service: Cardiovascular;  Laterality: N/A;  . CHOLECYSTECTOMY    . COLONOSCOPY    . ESOPHAGOGASTRODUODENOSCOPY  12/21/2010   Procedure: ESOPHAGOGASTRODUODENOSCOPY (EGD);  Surgeon: Landry Dyke, MD;  Location: [facility];  Service: Endoscopy;  Laterality: N/A;  . EXCISION MORTON'S NEUROMA    . FRACTURE SURGERY    . KNEE SURGERY     x 3  . NECK SURGERY    . ROTATOR CUFF REPAIR     . TONSILLECTOMY    . TOTAL KNEE ARTHROPLASTY Left 04/10/2017   Procedure: LEFT TOTAL KNEE ARTHROPLASTY;  Surgeon: Paralee Cancel, MD;  [facility];  Service: Orthopedics;  Laterality: Left;  70 mins  . UPPER GASTROINTESTINAL ENDOSCOPY    . WRIST SURGERY      There were no vitals filed for this visit.  Subjective Assessment - 05/29/17 1411    Subjective  Pt 20 mins late. Doing better today with less pain    Patient is accompained by:  Family member    Pertinent History  Neck, back, right RTC, left wrist and left ankle surgery.  Athritis and DM.    Patient Stated Goals  I would like to be more mobile, walk better and travel.    Currently in Pain?  Yes    Pain Score  5     Pain Location  Knee    Pain Orientation  Left    Pain Frequency  Constant                       [facility] Adult PT Treatment/Exercise - 05/29/17 0001      Exercises   Exercises  Knee/Hip      Knee/Hip Exercises: Aerobic  Nustep  76min L4 UE/LE      Modalities   Modalities  Electrical engineer Stimulation Location  Left knee IFC x 15 mins 1-10hz     Electrical Stimulation Goals  Pain      Vasopneumatic   Number Minutes Vasopneumatic   15 minutes    Vasopnuematic Location   Knee    Vasopneumatic Pressure  Medium    Vasopneumatic Temperature   60      Manual Therapy   Manual Therapy  Passive ROM    Passive ROM  PROM x 5 minutes into left knee  a extension also included patellar mobs.               PT Short Term Goals - 04/16/17 1447      PT SHORT TERM GOAL #1   Title  STG's=LTG's.        PT Long Term Goals - 05/14/17 1421      PT LONG TERM GOAL #1   Title  Independent with a HEP.    Time  4    Period  Weeks    Status  On-going      PT LONG TERM GOAL #2   Title  Full active left knee extension in order to normalize gait.    Time  4    Period  Weeks    Status  On-going      PT LONG TERM GOAL #3   Title  Active knee  flexion to 115 degrees+ so the patient can perform functional tasks and do so with pain not > 2-3/10.    Time  4    Period  Weeks    Status  On-going      PT LONG TERM GOAL #4   Title  Increase left knee strength to a solid 4+/5 to provide good stability for accomplishment of functional activities.    Time  4    Period  Weeks    Status  On-going      PT LONG TERM GOAL #5   Title  Perform a reciprocating stair gait with one railing with pain not > 2-3/10.    Time  4    Period  Weeks    Status  On-going      PT LONG TERM GOAL #6   Title  Decrease edema to within 3 cms of non-affected side to assist with pain reduction and range of motion gains.    Time  4    Period  Weeks    Status  On-going            Plan - 05/29/17 1729    Clinical Impression Statement  Pt arrived to clinic 20 mins late so Rx was shortened. Rx focused on ROM and Pt was able to reach 0-130 degrees PROM today. normal response to modalities.    Clinical Presentation  Stable    Rehab Potential  Excellent    Clinical Impairments Affecting Rehab Potential  [place]. FOTO 13 visit 57%    PT Frequency  3x / week    PT Duration  4 weeks    PT Treatment/Interventions  ADLs/Self Care Home Management;Cryotherapy;Electrical Stimulation;Gait training;Stair training;Functional mobility training;Therapeutic activities;Therapeutic exercise;Neuromuscular re-education;Patient/family education;Passive range of motion;Manual techniques;Vasopneumatic Device    PT Next Visit Plan  Continue with stairs, functionaly strengthening.    PT Home Exercise Plan  supine marching, bridge, SDLY clam, QL stretch in supine, sit to stand (hold bridge if  painful)      1 visit left.. More visits?    Consulted and Agree with Plan of Care  Patient       Patient will benefit from skilled therapeutic intervention in order to improve the following deficits and impairments:  Abnormal gait, Decreased activity tolerance, Decreased mobility, Decreased  range of motion, Decreased strength, Increased edema, Pain  Visit Diagnosis: Chronic pain of left knee  Stiffness of left knee, not elsewhere classified  Localized edema  Unsteadiness on feet  Muscle weakness (generalized)     Problem List Patient Active Problem List   Diagnosis Date Noted  . S/P left TKA 04/10/2017  . Unstable gait 01/12/2017  . Generalized osteoarthritis of hand 01/12/2017  . RLS (restless legs syndrome) 11/09/2016  . GERD (gastroesophageal reflux disease) 07/13/2016  . Class 2 severe obesity due to excess calories with serious comorbidity and body mass index (BMI) of 36.0 to 36.9 in adult ([place]) 06/01/2016  . Vitamin D deficiency 06/01/2016  . Vitamin B12 deficiency 03/27/2016  . Chronic cough 03/16/2016  . Left foot pain 03/16/2016  . Demand ischemia ([place]) 02/14/2015  . Degenerative drusen 07/13/2013  . Type 2 diabetes mellitus without ophthalmic manifestations ([place]) 07/13/2013  . DM (diabetes mellitus) type II controlled, neurological manifestation ([place]) 05/07/2012  . Paroxysmal atrial tachycardia ([place]) 05/07/2012  . Essential hypertension, benign 05/07/2012  . Depression 05/07/2012  . Hyperlipidemia 05/07/2012  . Fibromyalgia 05/07/2012    Mikhael Hendriks,CHRIS, PTA 05/29/2017, 5:39 PM  [facility] [place], [place], [address] Phone: [phone]   Fax:  [phone]  Name: HARMONIE VERRASTRO MRN: [id number] Date of Birth: 1940-05-20

## 2017-06-04 ENCOUNTER — Encounter: Payer: Self-pay | Admitting: Physical Therapy

## 2017-06-04 ENCOUNTER — Ambulatory Visit: Payer: PPO | Admitting: Physical Therapy

## 2017-06-04 DIAGNOSIS — R600 Localized edema: Secondary | ICD-10-CM

## 2017-06-04 DIAGNOSIS — M25562 Pain in left knee: Secondary | ICD-10-CM | POA: Diagnosis not present

## 2017-06-04 DIAGNOSIS — G8929 Other chronic pain: Secondary | ICD-10-CM

## 2017-06-04 DIAGNOSIS — M25662 Stiffness of left knee, not elsewhere classified: Secondary | ICD-10-CM

## 2017-06-04 NOTE — Therapy (Signed)
[facility] [place], [place], [address] Phone: [phone]   Fax:  [phone]  Physical Therapy Treatment  Patient Details  Name: [name] MRN: [id number] Date of Birth: 1940-03-18 Referring Provider: Paralee Cancel MD   Encounter Date: 06/04/2017  PT End of Session - 06/04/17 1404    Visit Number  15    Number of Visits  18    Date for PT Re-Evaluation  06/11/17    Authorization Type  FOTO AT LEAST EVERY 5TH VISIT.    PT Start Time  1400    PT Stop Time  1451    PT Time Calculation (min)  51 min    Activity Tolerance  Patient tolerated treatment well    Behavior During Therapy  WFL for tasks assessed/performed       Past Medical History:  Diagnosis Date  . Anxiety   . Arthritis   . Complication of anesthesia   . Depression   . Dysrhythmia    Paroysmal Atrial Tachycardia  . Essential hypertension   . Fibromyalgia   . GERD (gastroesophageal reflux disease)   . Hyperlipemia   . PONV (postoperative nausea and vomiting)   . PSVT (paroxysmal supraventricular tachycardia) ([place])   . Reflux   . Restless leg syndrome   . Type 2 diabetes mellitus ([place])     Past Surgical History:  Procedure Laterality Date  . ABDOMINAL HYSTERECTOMY    . ANKLE SURGERY    . BACK SURGERY     T12-L1 fusion, Anterior Cervical fusion  . CARDIAC CATHETERIZATION N/A 02/16/2015   Procedure: Left Heart Cath and Coronary Angiography;  Surgeon: Sherren Mocha, MD;  Location: [facility];  Service: Cardiovascular;  Laterality: N/A;  . CHOLECYSTECTOMY    . COLONOSCOPY    . ESOPHAGOGASTRODUODENOSCOPY  12/21/2010   Procedure: ESOPHAGOGASTRODUODENOSCOPY (EGD);  Surgeon: Landry Dyke, MD;  Location: [facility];  Service: Endoscopy;  Laterality: N/A;  . EXCISION MORTON'S NEUROMA    . FRACTURE SURGERY    . KNEE SURGERY     x 3  . NECK SURGERY    . ROTATOR CUFF REPAIR    . TONSILLECTOMY    . TOTAL KNEE ARTHROPLASTY Left 04/10/2017    Procedure: LEFT TOTAL KNEE ARTHROPLASTY;  Surgeon: Paralee Cancel, MD;  Location: [facility];  Service: Orthopedics;  Laterality: Left;  70 mins  . UPPER GASTROINTESTINAL ENDOSCOPY    . WRIST SURGERY      There were no vitals filed for this visit.  Subjective Assessment - 06/04/17 1401    Subjective  Reports that yesterday while she walked she felt her knee "go out of joint" laterally. Reports she bought compression leggings from thigh to ankle and states that they seem to help.    Pertinent History  Neck, back, right RTC, left wrist and left ankle surgery.  Athritis and DM.    Patient Stated Goals  I would like to be more mobile, walk better and travel.    Currently in Pain?  Yes    Pain Score  2     Pain Location  Knee    Pain Orientation  Left    Pain Descriptors / Indicators  Sore    Pain Type  Surgical pain    Pain Onset  More than a month ago    Pain Frequency  Constant         [facility] PT Assessment - 06/04/17 0001      Assessment   Medical  Diagnosis  Left total knee replacement.    Onset Date/Surgical Date  04/10/17    Next MD Visit  06/2017      Precautions   Precautions  Fall      Restrictions   Weight Bearing Restrictions  No      Observation/Other Assessments-Edema    Edema  Circumferential      Circumferential Edema   Circumferential - Right  49 cm    Circumferential - Left   50 cm      ROM / Strength   AROM / PROM / Strength  AROM      AROM   Overall AROM   Within functional limits for tasks performed    AROM Assessment Site  Knee    Right/Left Knee  Left    Left Knee Extension  0    Left Knee Flexion  124                   [facility] Adult PT Treatment/Exercise - 06/04/17 0001      Knee/Hip Exercises: Aerobic   Nustep  L6, seat 7 x15 min      Knee/Hip Exercises: Standing   Hip Abduction  AROM;Left;15 reps;Knee straight limited due to R hip pain    Forward Step Up  Left;2 sets;10 reps;Hand Hold: 2;Step Height: 6"      Knee/Hip Exercises:  Seated   Long Arc Quad  Strengthening;Left;2 sets;10 reps;Weights    Long Arc Quad Weight  4 lbs.      Modalities   Modalities  Psychologist, educational Location  L knee    Electrical Stimulation Action  IFC    Electrical Stimulation Parameters  1-10 hz x15 min    Electrical Stimulation Goals  Pain;Edema      Vasopneumatic   Number Minutes Vasopneumatic   15 minutes    Vasopnuematic Location   Knee    Vasopneumatic Pressure  Low    Vasopneumatic Temperature   57      Manual Therapy   Manual Therapy  Soft tissue mobilization    Soft tissue mobilization  Manual edema mobilizations to L knee to reduce L knee edema present folowing TKR               PT Short Term Goals - 04/16/17 1447      PT SHORT TERM GOAL #1   Title  STG's=LTG's.        PT Long Term Goals - 06/04/17 1450      PT LONG TERM GOAL #1   Title  Independent with a HEP.    Time  4    Period  Weeks    Status  On-going      PT LONG TERM GOAL #2   Title  Full active left knee extension in order to normalize gait.    Time  4    Period  Weeks    Status  Achieved      PT LONG TERM GOAL #3   Title  Active knee flexion to 115 degrees+ so the patient can perform functional tasks and do so with pain not > 2-3/10.    Time  4    Period  Weeks    Status  Achieved      PT LONG TERM GOAL #4   Title  Increase left knee strength to a solid 4+/5 to provide good stability for accomplishment of functional activities.    Time  4    Period  Weeks    Status  On-going      PT LONG TERM GOAL #5   Title  Perform a reciprocating stair gait with one railing with pain not > 2-3/10.    Time  4    Period  Weeks    Status  On-going      PT LONG TERM GOAL #6   Title  Decrease edema to within 3 cms of non-affected side to assist with pain reduction and range of motion gains.    Time  4    Period  Weeks    Status  Achieved            Plan -  06/04/17 1443    Clinical Impression Statement  Patient tolerated today's treatment overall well as she continues to fatigue with exercises. Patient arrived to clinic with B compression leggings donned to BLE from B ankle to B thigh. 1 cm difference with edema as L knee > R knee. AROM of L knee measured as 0-124 deg which was self limited into flexion as to not exaggerate L knee pain. Patient able to complete forward step ups well with BUE assist. Patient limited by R hip pain with L hip abduction. Normal modalities response noted following removal of the modalities.    Rehab Potential  Excellent    Clinical Impairments Affecting Rehab Potential  [place]. FOTO 13 visit 57%    PT Frequency  3x / week    PT Duration  4 weeks    PT Treatment/Interventions  ADLs/Self Care Home Management;Cryotherapy;Electrical Stimulation;Gait training;Stair training;Functional mobility training;Therapeutic activities;Therapeutic exercise;Neuromuscular re-education;Patient/family education;Passive range of motion;Manual techniques;Vasopneumatic Device    PT Next Visit Plan  Continue with stairs, functionaly strengthening.    PT Home Exercise Plan  supine marching, bridge, SDLY clam, QL stretch in supine, sit to stand (hold bridge if painful)         Consulted and Agree with Plan of Care  Patient       Patient will benefit from skilled therapeutic intervention in order to improve the following deficits and impairments:  Abnormal gait, Decreased activity tolerance, Decreased mobility, Decreased range of motion, Decreased strength, Increased edema, Pain  Visit Diagnosis: Chronic pain of left knee  Stiffness of left knee, not elsewhere classified  Localized edema     Problem List Patient Active Problem List   Diagnosis Date Noted  . S/P left TKA 04/10/2017  . Unstable gait 01/12/2017  . Generalized osteoarthritis of hand 01/12/2017  . RLS (restless legs syndrome) 11/09/2016  . GERD (gastroesophageal reflux  disease) 07/13/2016  . Class 2 severe obesity due to excess calories with serious comorbidity and body mass index (BMI) of 36.0 to 36.9 in adult ([place]) 06/01/2016  . Vitamin D deficiency 06/01/2016  . Vitamin B12 deficiency 03/27/2016  . Chronic cough 03/16/2016  . Left foot pain 03/16/2016  . Demand ischemia ([place]) 02/14/2015  . Degenerative drusen 07/13/2013  . Type 2 diabetes mellitus without ophthalmic manifestations ([place]) 07/13/2013  . DM (diabetes mellitus) type II controlled, neurological manifestation ([place]) 05/07/2012  . Paroxysmal atrial tachycardia ([place]) 05/07/2012  . Essential hypertension, benign 05/07/2012  . Depression 05/07/2012  . Hyperlipidemia 05/07/2012  . Fibromyalgia 05/07/2012    Standley Brooking, PTA 06/04/2017, 3:06 PM  [facility] [address] [place], [place], [address] Phone: [phone]   Fax:  [phone]  Name: TONILYNN BIEKER MRN: [id number] Date of Birth: May 30, 1940

## 2017-06-07 ENCOUNTER — Ambulatory Visit: Payer: PPO | Admitting: Physical Therapy

## 2017-06-07 DIAGNOSIS — M25662 Stiffness of left knee, not elsewhere classified: Secondary | ICD-10-CM

## 2017-06-07 DIAGNOSIS — R600 Localized edema: Secondary | ICD-10-CM

## 2017-06-07 DIAGNOSIS — M25562 Pain in left knee: Secondary | ICD-10-CM | POA: Diagnosis not present

## 2017-06-07 DIAGNOSIS — G8929 Other chronic pain: Secondary | ICD-10-CM

## 2017-06-07 NOTE — Therapy (Signed)
[facility] [place], [place], [address] Phone: [phone]   Fax:  [phone]  Physical Therapy Treatment  Patient Details  Name: [name] MRN: [id number] Date of Birth: [birth year] Referring Provider: Paralee Cancel MD   Encounter Date: 06/07/2017  PT End of Session - 06/07/17 1503    Visit Number  16    Number of Visits  18    Date for PT Re-Evaluation  06/11/17    Authorization Type  FOTO AT LEAST EVERY 5TH VISIT.    Authorization Time Period  To MD 03-29-17    PT Start Time  1429    PT Stop Time  1513    PT Time Calculation (min)  44 min    Activity Tolerance  Patient tolerated treatment well    Behavior During Therapy  [facility] for tasks assessed/performed       Past Medical History:  Diagnosis Date  . Anxiety   . Arthritis   . Complication of anesthesia   . Depression   . Dysrhythmia    Paroysmal Atrial Tachycardia  . Essential hypertension   . Fibromyalgia   . GERD (gastroesophageal reflux disease)   . Hyperlipemia   . PONV (postoperative nausea and vomiting)   . PSVT (paroxysmal supraventricular tachycardia) ([place])   . Reflux   . Restless leg syndrome   . Type 2 diabetes mellitus ([place])     Past Surgical History:  Procedure Laterality Date  . ABDOMINAL HYSTERECTOMY    . ANKLE SURGERY    . BACK SURGERY     T12-L1 fusion, Anterior Cervical fusion  . CARDIAC CATHETERIZATION N/A 02/16/2015   Procedure: Left Heart Cath and Coronary Angiography;  Surgeon: Sherren Mocha, MD;  Location: [facility];  Service: Cardiovascular;  Laterality: N/A;  . CHOLECYSTECTOMY    . COLONOSCOPY    . ESOPHAGOGASTRODUODENOSCOPY  12/21/2010   Procedure: ESOPHAGOGASTRODUODENOSCOPY (EGD);  Surgeon: Landry Dyke, MD;  Location: [facility];  Service: Endoscopy;  Laterality: N/A;  . EXCISION MORTON'S NEUROMA    . FRACTURE SURGERY    . KNEE SURGERY     x 3  . NECK SURGERY    . ROTATOR CUFF REPAIR    . TONSILLECTOMY     . TOTAL KNEE ARTHROPLASTY Left 04/10/2017   Procedure: LEFT TOTAL KNEE ARTHROPLASTY;  Surgeon: Paralee Cancel, MD;  [facility];  Service: Orthopedics;  Laterality: Left;  70 mins  . UPPER GASTROINTESTINAL ENDOSCOPY    . WRIST SURGERY      There were no vitals filed for this visit.  Subjective Assessment - 06/07/17 1435    Subjective  Patient arrived and reported feeling less discomfort today.     Pertinent History  Neck, back, right RTC, left wrist and left ankle surgery.  Athritis and DM.    Patient Stated Goals  I would like to be more mobile, walk better and travel.    Currently in Pain?  Yes    Pain Score  2     Pain Location  Knee    Pain Orientation  Left    Pain Descriptors / Indicators  Sore    Pain Type  Surgical pain    Pain Onset  More than a month ago    Pain Frequency  Constant    Aggravating Factors   any prolong activity    Pain Relieving Factors  at rest  [facility] Adult PT Treatment/Exercise - 06/07/17 0001      Knee/Hip Exercises: Aerobic   Nustep  L6, seat 7 x15 min UE/LE activity, monitored      Knee/Hip Exercises: Standing   Hip Abduction  Stengthening;Left;Knee straight;1 set;15 reps    Abduction Limitations  right knee discomfort      Knee/Hip Exercises: Seated   Long Arc Quad  Strengthening;Left;2 sets;10 reps;Weights    Long Arc Quad Weight  4 lbs.      Knee/Hip Exercises: Supine   Straight Leg Raises  Strengthening;Left;20 reps      Acupuncturist Location  L knee    Electrical Stimulation Action  IFC    Electrical Stimulation Parameters  1-10hz  x79min    Electrical Stimulation Goals  Pain;Edema      Vasopneumatic   Number Minutes Vasopneumatic   15 minutes    Vasopnuematic Location   Knee    Vasopneumatic Pressure  Low               PT Short Term Goals - 04/16/17 1447      PT SHORT TERM GOAL #1   Title  STG's=LTG's.        PT Long Term Goals -  06/04/17 1450      PT LONG TERM GOAL #1   Title  Independent with a HEP.    Time  4    Period  Weeks    Status  On-going      PT LONG TERM GOAL #2   Title  Full active left knee extension in order to normalize gait.    Time  4    Period  Weeks    Status  Achieved      PT LONG TERM GOAL #3   Title  Active knee flexion to 115 degrees+ so the patient can perform functional tasks and do so with pain not > 2-3/10.    Time  4    Period  Weeks    Status  Achieved      PT LONG TERM GOAL #4   Title  Increase left knee strength to a solid 4+/5 to provide good stability for accomplishment of functional activities.    Time  4    Period  Weeks    Status  On-going      PT LONG TERM GOAL #5   Title  Perform a reciprocating stair gait with one railing with pain not > 2-3/10.    Time  4    Period  Weeks    Status  On-going      PT LONG TERM GOAL #6   Title  Decrease edema to within 3 cms of non-affected side to assist with pain reduction and range of motion gains.    Time  4    Period  Weeks    Status  Achieved            Plan - 06/07/17 1504    Clinical Impression Statement  Patient tolerated treatment well today with some limitations due to right knee pain with prolong standing. Patient progressing with left knee strengthening as tolerated. Remaining goals ongoing at this time.     Rehab Potential  Excellent    Clinical Impairments Affecting Rehab Potential  [place]. FOTO 13 visit 57%    PT Frequency  3x / week    PT Duration  4 weeks    PT Treatment/Interventions  ADLs/Self Care Home Management;Cryotherapy;Electrical Stimulation;Gait training;Stair training;Functional mobility training;Therapeutic activities;Therapeutic  exercise;Neuromuscular re-education;Patient/family education;Passive range of motion;Manual techniques;Vasopneumatic Device    PT Next Visit Plan  Continue with stairs, functionaly strengthening.    Consulted and Agree with Plan of Care  Patient        Patient will benefit from skilled therapeutic intervention in order to improve the following deficits and impairments:  Abnormal gait, Decreased activity tolerance, Decreased mobility, Decreased range of motion, Decreased strength, Increased edema, Pain  Visit Diagnosis: Chronic pain of left knee  Stiffness of left knee, not elsewhere classified  Localized edema     Problem List Patient Active Problem List   Diagnosis Date Noted  . S/P left TKA 04/10/2017  . Unstable gait 01/12/2017  . Generalized osteoarthritis of hand 01/12/2017  . RLS (restless legs syndrome) 11/09/2016  . GERD (gastroesophageal reflux disease) 07/13/2016  . Class 2 severe obesity due to excess calories with serious comorbidity and body mass index (BMI) of 36.0 to 36.9 in adult ([place]) 06/01/2016  . Vitamin D deficiency 06/01/2016  . Vitamin B12 deficiency 03/27/2016  . Chronic cough 03/16/2016  . Left foot pain 03/16/2016  . Demand ischemia ([place]) 02/14/2015  . Degenerative drusen 07/13/2013  . Type 2 diabetes mellitus without ophthalmic manifestations ([place]) 07/13/2013  . DM (diabetes mellitus) type II controlled, neurological manifestation ([place]) 05/07/2012  . Paroxysmal atrial tachycardia ([place]) 05/07/2012  . Essential hypertension, benign 05/07/2012  . Depression 05/07/2012  . Hyperlipidemia 05/07/2012  . Fibromyalgia 05/07/2012    Tony Granquist P, PTA 06/07/2017, 3:16 PM  [facility] [place], [place], [address] Phone: [phone]   Fax:  [phone]  Name: [name] MRN: [id number] Date of Birth: 1940/03/08

## 2017-06-11 ENCOUNTER — Encounter: Payer: Self-pay | Admitting: Physical Therapy

## 2017-06-11 ENCOUNTER — Ambulatory Visit: Payer: PPO | Admitting: Physical Therapy

## 2017-06-11 DIAGNOSIS — G8929 Other chronic pain: Secondary | ICD-10-CM

## 2017-06-11 DIAGNOSIS — M25562 Pain in left knee: Secondary | ICD-10-CM | POA: Diagnosis not present

## 2017-06-11 DIAGNOSIS — M25662 Stiffness of left knee, not elsewhere classified: Secondary | ICD-10-CM

## 2017-06-11 DIAGNOSIS — R600 Localized edema: Secondary | ICD-10-CM

## 2017-06-11 NOTE — Therapy (Signed)
[facility] [place], [place], [address] Phone: [phone]   Fax:  [phone]  Physical Therapy Treatment  Patient Details  Name: [name] MRN: [id number] Date of Birth: [birth year] Referring Provider: Paralee Cancel MD   Encounter Date: 06/11/2017  PT End of Session - 06/11/17 1512    Visit Number  17    Number of Visits  18    Date for PT Re-Evaluation  06/11/17    Authorization Type  FOTO AT LEAST EVERY 5TH VISIT.    PT Start Time  1436    PT Stop Time  1522    PT Time Calculation (min)  46 min    Activity Tolerance  Patient tolerated treatment well    Behavior During Therapy  [facility] for tasks assessed/performed       Past Medical History:  Diagnosis Date  . Anxiety   . Arthritis   . Complication of anesthesia   . Depression   . Dysrhythmia    Paroysmal Atrial Tachycardia  . Essential hypertension   . Fibromyalgia   . GERD (gastroesophageal reflux disease)   . Hyperlipemia   . PONV (postoperative nausea and vomiting)   . PSVT (paroxysmal supraventricular tachycardia) ([facility])   . Reflux   . Restless leg syndrome   . Type 2 diabetes mellitus ([place])     Past Surgical History:  Procedure Laterality Date  . ABDOMINAL HYSTERECTOMY    . ANKLE SURGERY    . BACK SURGERY     T12-L1 fusion, Anterior Cervical fusion  . CARDIAC CATHETERIZATION N/A 02/16/2015   Procedure: Left Heart Cath and Coronary Angiography;  Surgeon: Sherren Mocha, MD;  Location: [facility];  Service: Cardiovascular;  Laterality: N/A;  . CHOLECYSTECTOMY    . COLONOSCOPY    . ESOPHAGOGASTRODUODENOSCOPY  12/21/2010   Procedure: ESOPHAGOGASTRODUODENOSCOPY (EGD);  Surgeon: Landry Dyke, MD;  Location: [facility];  Service: Endoscopy;  Laterality: N/A;  . EXCISION MORTON'S NEUROMA    . FRACTURE SURGERY    . KNEE SURGERY     x 3  . NECK SURGERY    . ROTATOR CUFF REPAIR    . TONSILLECTOMY    . TOTAL KNEE ARTHROPLASTY Left 04/10/2017    Procedure: LEFT TOTAL KNEE ARTHROPLASTY;  Surgeon: Paralee Cancel, MD;  [facility];  Service: Orthopedics;  Laterality: Left;  70 mins  . UPPER GASTROINTESTINAL ENDOSCOPY    . WRIST SURGERY      There were no vitals filed for this visit.  Subjective Assessment - 06/11/17 1435    Subjective  Reports cleaning off her front porch and getting some plants this weekend but reports a fibromyalgia flare up this weekend as well.    Pertinent History  Neck, back, right RTC, left wrist and left ankle surgery.  Athritis and DM.    Patient Stated Goals  I would like to be more mobile, walk better and travel.    Currently in Pain?  No/denies         [facility] PT Assessment - 06/11/17 0001      Assessment   Medical Diagnosis  Left total knee replacement.    Onset Date/Surgical Date  04/10/17    Next MD Visit  06/2017      Precautions   Precautions  Fall      Restrictions   Weight Bearing Restrictions  No                   [facility] Adult  PT Treatment/Exercise - 06/11/17 0001      Knee/Hip Exercises: Aerobic   Nustep  L6, seat 7 x15 min UE/LE activity, monitored      Knee/Hip Exercises: Standing   Hip Abduction  Stengthening;Left;20 reps;Knee straight    Forward Step Up  Left;20 reps;Hand Hold: 2;Step Height: 6"      Knee/Hip Exercises: Seated   Long Arc Quad  Strengthening;Left;2 sets;10 reps;Weights    Long Arc Quad Weight  4 lbs.    Clamshell with TheraBand  Red x20 reps    Hamstring Curl  Strengthening;Left;20 reps;Limitations    Hamstring Limitations  Red theraband      Knee/Hip Exercises: Supine   Straight Leg Raises  Strengthening;Left;10 reps      Modalities   Modalities  Electrical Stimulation;Vasopneumatic      Electrical Stimulation   Electrical Stimulation Location  L knee    Electrical Stimulation Action  IFC    Electrical Stimulation Parameters  1-10 hz x15 min    Electrical Stimulation Goals  Pain      Vasopneumatic   Number Minutes Vasopneumatic    15 minutes    Vasopnuematic Location   Knee    Vasopneumatic Pressure  Low    Vasopneumatic Temperature   34               PT Short Term Goals - 04/16/17 1447      PT SHORT TERM GOAL #1   Title  STG's=LTG's.        PT Long Term Goals - 06/04/17 1450      PT LONG TERM GOAL #1   Title  Independent with a HEP.    Time  4    Period  Weeks    Status  On-going      PT LONG TERM GOAL #2   Title  Full active left knee extension in order to normalize gait.    Time  4    Period  Weeks    Status  Achieved      PT LONG TERM GOAL #3   Title  Active knee flexion to 115 degrees+ so the patient can perform functional tasks and do so with pain not > 2-3/10.    Time  4    Period  Weeks    Status  Achieved      PT LONG TERM GOAL #4   Title  Increase left knee strength to a solid 4+/5 to provide good stability for accomplishment of functional activities.    Time  4    Period  Weeks    Status  On-going      PT LONG TERM GOAL #5   Title  Perform a reciprocating stair gait with one railing with pain not > 2-3/10.    Time  4    Period  Weeks    Status  On-going      PT LONG TERM GOAL #6   Title  Decrease edema to within 3 cms of non-affected side to assist with pain reduction and range of motion gains.    Time  4    Period  Weeks    Status  Achieved            Plan - 06/11/17 1513    Clinical Impression Statement  Patient arrived to today's treatment with reports of increased activity over the weekend but no pain upon arrival. Patient did experience L superior hip pain along iliac crest region with patient educated that that could be  surrounding musclature or compensatory response. L hip ER noted with L hip abduction and L foot ER with forward step ups. Small white dot located in superior L incision with small area of redness surrounding the white dot. Normal modalities response noted following removal of the modalities.     Rehab Potential  Excellent    Clinical  Impairments Affecting Rehab Potential  [place]. FOTO 13 visit 57%    PT Frequency  3x / week    PT Duration  4 weeks    PT Treatment/Interventions  ADLs/Self Care Home Management;Cryotherapy;Electrical Stimulation;Gait training;Stair training;Functional mobility training;Therapeutic activities;Therapeutic exercise;Neuromuscular re-education;Patient/family education;Passive range of motion;Manual techniques;Vasopneumatic Device    PT Next Visit Plan  Continue with stairs, functionaly strengthening.    PT Home Exercise Plan  supine marching, bridge, SDLY clam, QL stretch in supine, sit to stand (hold bridge if painful)         Consulted and Agree with Plan of Care  Patient       Patient will benefit from skilled therapeutic intervention in order to improve the following deficits and impairments:  Abnormal gait, Decreased activity tolerance, Decreased mobility, Decreased range of motion, Decreased strength, Increased edema, Pain  Visit Diagnosis: Chronic pain of left knee  Stiffness of left knee, not elsewhere classified  Localized edema     Problem List Patient Active Problem List   Diagnosis Date Noted  . S/P left TKA 04/10/2017  . Unstable gait 01/12/2017  . Generalized osteoarthritis of hand 01/12/2017  . RLS (restless legs syndrome) 11/09/2016  . GERD (gastroesophageal reflux disease) 07/13/2016  . Class 2 severe obesity due to excess calories with serious comorbidity and body mass index (BMI) of 36.0 to 36.9 in adult ([place]) 06/01/2016  . Vitamin D deficiency 06/01/2016  . Vitamin B12 deficiency 03/27/2016  . Chronic cough 03/16/2016  . Left foot pain 03/16/2016  . Demand ischemia ([place]) 02/14/2015  . Degenerative drusen 07/13/2013  . Type 2 diabetes mellitus without ophthalmic manifestations ([place]) 07/13/2013  . DM (diabetes mellitus) type II controlled, neurological manifestation ([place]) 05/07/2012  . Paroxysmal atrial tachycardia ([place]) 05/07/2012  . Essential hypertension,  benign 05/07/2012  . Depression 05/07/2012  . Hyperlipidemia 05/07/2012  . Fibromyalgia 05/07/2012    Standley Brooking, PTA 06/11/2017, 4:06 PM  [facility] [address] [place], [place], [address] Phone: [phone]   Fax:  [phone]  Name: [name] MRN: [id number] Date of Birth: [birth year]

## 2017-06-14 ENCOUNTER — Ambulatory Visit: Payer: PPO | Attending: Orthopedic Surgery | Admitting: Physical Therapy

## 2017-06-14 ENCOUNTER — Encounter: Payer: Self-pay | Admitting: Physical Therapy

## 2017-06-14 DIAGNOSIS — G8929 Other chronic pain: Secondary | ICD-10-CM | POA: Diagnosis not present

## 2017-06-14 DIAGNOSIS — M6281 Muscle weakness (generalized): Secondary | ICD-10-CM | POA: Insufficient documentation

## 2017-06-14 DIAGNOSIS — M25662 Stiffness of left knee, not elsewhere classified: Secondary | ICD-10-CM | POA: Insufficient documentation

## 2017-06-14 DIAGNOSIS — R2681 Unsteadiness on feet: Secondary | ICD-10-CM | POA: Insufficient documentation

## 2017-06-14 DIAGNOSIS — M25562 Pain in left knee: Secondary | ICD-10-CM | POA: Diagnosis not present

## 2017-06-14 DIAGNOSIS — R600 Localized edema: Secondary | ICD-10-CM | POA: Diagnosis not present

## 2017-06-14 NOTE — Patient Instructions (Addendum)
stretch 30 sec x5-10 2-3 x daily    Strengthening: Hip Abduction (Side-Lying)   Tighten muscles on front of left thigh, then lift leg __5__ inches from surface, keeping knee locked.  Repeat __10__ times per set. Do __2__ sets per session. Do _2___ sessions per day.    Strengthening: Hip Abductor - Resisted    With band looped around both legs above knees, push thighs apart. Repeat __10__ times per set. Do __1-2__ sets per session. Do _1-2___ sessions per day.

## 2017-06-14 NOTE — Therapy (Signed)
[facility] [place], [place], [address] Phone: [phone]   Fax:  [phone]  Physical Therapy Treatment  Patient Details  Name: [name] MRN: [id number] Date of Birth: 1940/07/07 Referring Provider: Paralee Cancel MD   Encounter Date: 06/14/2017  PT End of Session - 06/14/17 1438    Visit Number  18    Number of Visits  18    Date for PT Re-Evaluation  06/11/17    PT Start Time  1430    PT Stop Time  1513    PT Time Calculation (min)  43 min    Activity Tolerance  Patient tolerated treatment well    Behavior During Therapy  [facility] for tasks assessed/performed       Past Medical History:  Diagnosis Date  . Anxiety   . Arthritis   . Complication of anesthesia   . Depression   . Dysrhythmia    Paroysmal Atrial Tachycardia  . Essential hypertension   . Fibromyalgia   . GERD (gastroesophageal reflux disease)   . Hyperlipemia   . PONV (postoperative nausea and vomiting)   . PSVT (paroxysmal supraventricular tachycardia) ([place])   . Reflux   . Restless leg syndrome   . Type 2 diabetes mellitus ([place])     Past Surgical History:  Procedure Laterality Date  . ABDOMINAL HYSTERECTOMY    . ANKLE SURGERY    . BACK SURGERY     T12-L1 fusion, Anterior Cervical fusion  . CARDIAC CATHETERIZATION N/A 02/16/2015   Procedure: Left Heart Cath and Coronary Angiography;  Surgeon: Sherren Mocha, MD;  Location: [facility];  Service: Cardiovascular;  Laterality: N/A;  . CHOLECYSTECTOMY    . COLONOSCOPY    . ESOPHAGOGASTRODUODENOSCOPY  12/21/2010   Procedure: ESOPHAGOGASTRODUODENOSCOPY (EGD);  Surgeon: Landry Dyke, MD;  Location: [facility];  Service: Endoscopy;  Laterality: N/A;  . EXCISION MORTON'S NEUROMA    . FRACTURE SURGERY    . KNEE SURGERY     x 3  . NECK SURGERY    . ROTATOR CUFF REPAIR    . TONSILLECTOMY    . TOTAL KNEE ARTHROPLASTY Left 04/10/2017   Procedure: LEFT TOTAL KNEE ARTHROPLASTY;  Surgeon:  Paralee Cancel, MD;  [facility];  Service: Orthopedics;  Laterality: Left;  70 mins  . UPPER GASTROINTESTINAL ENDOSCOPY    . WRIST SURGERY      There were no vitals filed for this visit.  Subjective Assessment - 06/14/17 1437    Subjective  Patient arrived with repotes of no pain    Pertinent History  Neck, back, right RTC, left wrist and left ankle surgery.  Athritis and DM.    Patient Stated Goals  I would like to be more mobile, walk better and travel.    Currently in Pain?  No/denies         [facility] PT Assessment - 06/14/17 0001      ROM / Strength   AROM / PROM / Strength  Strength      Strength   Strength Assessment Site  Knee;Hip    Right/Left Hip  Left    Left Hip ABduction  4+/5    Right/Left Knee  Left    Left Knee Flexion  4+/5    Left Knee Extension  4+/5                   [facility] Adult PT Treatment/Exercise - 06/14/17 0001      Knee/Hip Exercises: Aerobic  Stationary Bike  L1-2 x33mn    Nustep  L6, seat 7 x15 min UE/LE activity, monitored      Knee/Hip Exercises: Standing   Hip Abduction  Stengthening;Left;20 reps;Knee straight    Other Standing Knee Exercises  up/down stairs x2      Knee/Hip Exercises: Seated   Long Arc Quad  Strengthening;Left;2 sets;10 reps;Weights    Long Arc Quad Weight  4 lbs.    Clamshell with TheraBand  Red x30    Hamstring Curl  Strengthening;Left;20 reps;Limitations    Hamstring Limitations  Red theraband      Knee/Hip Exercises: Supine   Straight Leg Raises  Strengthening;20 reps    Other Supine Knee/Hip Exercises  clam with green tband x10             PT Education - 06/14/17 1510    Education provided  Yes    Education Details  HEP    Person(s) Educated  Patient    Methods  Explanation;Handout    Comprehension  Verbalized understanding;Returned demonstration       PT Short Term Goals - 04/16/17 1447      PT SHORT TERM GOAL #1   Title  STG's=LTG's.        PT Long Term Goals - 06/14/17  1439      PT LONG TERM GOAL #1   Title  Independent with a HEP.    Time  4    Period  Weeks    Status  Achieved 06/14/17      PT LONG TERM GOAL #2   Title  Full active left knee extension in order to normalize gait.    Time  4    Period  Weeks    Status  Achieved      PT LONG TERM GOAL #3   Title  Active knee flexion to 115 degrees+ so the patient can perform functional tasks and do so with pain not > 2-3/10.    Baseline  MET 03-23-17    Time  4    Period  Weeks    Status  Achieved      PT LONG TERM GOAL #4   Title  Increase left knee strength to a solid 4+/5 to provide good stability for accomplishment of functional activities.    Time  4    Period  Weeks    Status  Achieved 06/14/17      PT LONG TERM GOAL #5   Title  Perform a reciprocating stair gait with one railing with pain not > 2-3/10.    Time  4    Period  Weeks    Status  Achieved 06/14/17      PT LONG TERM GOAL #6   Title  Decrease edema to within 3 cms of non-affected side to assist with pain reduction and range of motion gains.    Time  4    Period  Weeks    Status  Achieved            Plan - 06/14/17 1512    Clinical Impression Statement  Patient met all goals. DC to HEP and gym program    Rehab Potential  Excellent    PT Frequency  3x / week    PT Duration  4 weeks    PT Treatment/Interventions  ADLs/Self Care Home Management;Cryotherapy;Electrical Stimulation;Gait training;Stair training;Functional mobility training;Therapeutic activities;Therapeutic exercise;Neuromuscular re-education;Patient/family education;Passive range of motion;Manual techniques;Vasopneumatic Device    PT Next Visit Plan  DC    Consulted  and Agree with Plan of Care  Patient       Patient will benefit from skilled therapeutic intervention in order to improve the following deficits and impairments:  Abnormal gait, Decreased activity tolerance, Decreased mobility, Decreased range of motion, Decreased strength, Increased edema,  Pain  Visit Diagnosis: Chronic pain of left knee  Stiffness of left knee, not elsewhere classified  Localized edema  Unsteadiness on feet  Muscle weakness (generalized)     Problem List Patient Active Problem List   Diagnosis Date Noted  . S/P left TKA 04/10/2017  . Unstable gait 01/12/2017  . Generalized osteoarthritis of hand 01/12/2017  . RLS (restless legs syndrome) 11/09/2016  . GERD (gastroesophageal reflux disease) 07/13/2016  . Class 2 severe obesity due to excess calories with serious comorbidity and body mass index (BMI) of 36.0 to 36.9 in adult ([place]) 06/01/2016  . Vitamin D deficiency 06/01/2016  . Vitamin B12 deficiency 03/27/2016  . Chronic cough 03/16/2016  . Left foot pain 03/16/2016  . Demand ischemia ([place]) 02/14/2015  . Degenerative drusen 07/13/2013  . Type 2 diabetes mellitus without ophthalmic manifestations ([place]) 07/13/2013  . DM (diabetes mellitus) type II controlled, neurological manifestation ([place]) 05/07/2012  . Paroxysmal atrial tachycardia ([place]) 05/07/2012  . Essential hypertension, benign 05/07/2012  . Depression 05/07/2012  . Hyperlipidemia 05/07/2012  . Fibromyalgia 05/07/2012    Ladean Raya, PTA 06/14/17 3:14 PM  [facility] [place], [place], [address] Phone: [phone]   Fax:  [phone]  Name: [name] MRN: [id number] Date of Birth: 26-Aug-1940  PHYSICAL THERAPY DISCHARGE SUMMARY  Visits from Start of Care: 18.  Current functional level related to goals / functional outcomes: See above.   Remaining deficits: All goals met.   Education / Equipment: HEP. Plan: Patient agrees to discharge.  Patient goals were met. Patient is being discharged due to meeting the stated rehab goals.  ?????         Mali Applegate MPT

## 2017-06-22 ENCOUNTER — Telehealth: Payer: Self-pay | Admitting: Family Medicine

## 2017-06-22 DIAGNOSIS — G2581 Restless legs syndrome: Secondary | ICD-10-CM

## 2017-06-22 MED ORDER — ROPINIROLE HCL 1 MG PO TABS: 1.0000 mg | ORAL_TABLET | Freq: Every day | ORAL | 1 refills | Status: DC

## 2017-06-22 NOTE — Telephone Encounter (Signed)
Copied from [place] [id number]. Topic: Quick Communication - Rx Refill/Question >> Jun 22, 2017  9:25 AM Cleaster Corin, NT wrote: Medication:rOPINIRole (REQUIP) 1 MG tablet [[id number]]  Has the patient contacted their pharmacy?yes (Agent: If no, request that the patient contact the pharmacy for the refill.) Preferred Pharmacy (with phone number or street name): [facility], [place] - [address] [place] [address] Phone: [phone] Fax: [phone]   Agent: Please be advised that RX refills may take up to 3 business days. We ask that you follow-up with your pharmacy.

## 2017-06-22 NOTE — Telephone Encounter (Signed)
Request refill on Ropinirole 1 mg; last refill 11/10/16; # 68; RF x 1 Last office visit 05/15/17 for acute Last office visit for restless leg syndrome: 11/10/16   PCP: Dr. [name] Pharmacy: [facility]  Ropinirole refilled per protocol

## 2017-06-27 DIAGNOSIS — M9902 Segmental and somatic dysfunction of thoracic region: Secondary | ICD-10-CM | POA: Diagnosis not present

## 2017-06-27 DIAGNOSIS — M9901 Segmental and somatic dysfunction of cervical region: Secondary | ICD-10-CM | POA: Diagnosis not present

## 2017-06-27 DIAGNOSIS — M9903 Segmental and somatic dysfunction of lumbar region: Secondary | ICD-10-CM | POA: Diagnosis not present

## 2017-06-27 DIAGNOSIS — M5412 Radiculopathy, cervical region: Secondary | ICD-10-CM | POA: Diagnosis not present

## 2017-06-28 DIAGNOSIS — M9901 Segmental and somatic dysfunction of cervical region: Secondary | ICD-10-CM | POA: Diagnosis not present

## 2017-06-28 DIAGNOSIS — M9903 Segmental and somatic dysfunction of lumbar region: Secondary | ICD-10-CM | POA: Diagnosis not present

## 2017-06-28 DIAGNOSIS — M9902 Segmental and somatic dysfunction of thoracic region: Secondary | ICD-10-CM | POA: Diagnosis not present

## 2017-06-28 DIAGNOSIS — M5412 Radiculopathy, cervical region: Secondary | ICD-10-CM | POA: Diagnosis not present

## 2017-07-02 ENCOUNTER — Other Ambulatory Visit: Payer: Self-pay | Admitting: Dermatology

## 2017-07-02 DIAGNOSIS — D485 Neoplasm of uncertain behavior of skin: Secondary | ICD-10-CM | POA: Diagnosis not present

## 2017-07-02 DIAGNOSIS — M9902 Segmental and somatic dysfunction of thoracic region: Secondary | ICD-10-CM | POA: Diagnosis not present

## 2017-07-02 DIAGNOSIS — M5412 Radiculopathy, cervical region: Secondary | ICD-10-CM | POA: Diagnosis not present

## 2017-07-02 DIAGNOSIS — L820 Inflamed seborrheic keratosis: Secondary | ICD-10-CM | POA: Diagnosis not present

## 2017-07-02 DIAGNOSIS — M9903 Segmental and somatic dysfunction of lumbar region: Secondary | ICD-10-CM | POA: Diagnosis not present

## 2017-07-02 DIAGNOSIS — L570 Actinic keratosis: Secondary | ICD-10-CM | POA: Diagnosis not present

## 2017-07-02 DIAGNOSIS — M9901 Segmental and somatic dysfunction of cervical region: Secondary | ICD-10-CM | POA: Diagnosis not present

## 2017-07-03 DIAGNOSIS — M9903 Segmental and somatic dysfunction of lumbar region: Secondary | ICD-10-CM | POA: Diagnosis not present

## 2017-07-03 DIAGNOSIS — M9902 Segmental and somatic dysfunction of thoracic region: Secondary | ICD-10-CM | POA: Diagnosis not present

## 2017-07-03 DIAGNOSIS — M9901 Segmental and somatic dysfunction of cervical region: Secondary | ICD-10-CM | POA: Diagnosis not present

## 2017-07-03 DIAGNOSIS — M5412 Radiculopathy, cervical region: Secondary | ICD-10-CM | POA: Diagnosis not present

## 2017-07-05 DIAGNOSIS — M5412 Radiculopathy, cervical region: Secondary | ICD-10-CM | POA: Diagnosis not present

## 2017-07-05 DIAGNOSIS — M9902 Segmental and somatic dysfunction of thoracic region: Secondary | ICD-10-CM | POA: Diagnosis not present

## 2017-07-05 DIAGNOSIS — M9901 Segmental and somatic dysfunction of cervical region: Secondary | ICD-10-CM | POA: Diagnosis not present

## 2017-07-05 DIAGNOSIS — M9903 Segmental and somatic dysfunction of lumbar region: Secondary | ICD-10-CM | POA: Diagnosis not present

## 2017-07-06 DIAGNOSIS — M25562 Pain in left knee: Secondary | ICD-10-CM | POA: Diagnosis not present

## 2017-07-06 DIAGNOSIS — Z96652 Presence of left artificial knee joint: Secondary | ICD-10-CM | POA: Diagnosis not present

## 2017-07-06 DIAGNOSIS — M542 Cervicalgia: Secondary | ICD-10-CM | POA: Diagnosis not present

## 2017-07-06 DIAGNOSIS — Z471 Aftercare following joint replacement surgery: Secondary | ICD-10-CM | POA: Diagnosis not present

## 2017-07-10 DIAGNOSIS — M9903 Segmental and somatic dysfunction of lumbar region: Secondary | ICD-10-CM | POA: Diagnosis not present

## 2017-07-10 DIAGNOSIS — M9902 Segmental and somatic dysfunction of thoracic region: Secondary | ICD-10-CM | POA: Diagnosis not present

## 2017-07-10 DIAGNOSIS — M9901 Segmental and somatic dysfunction of cervical region: Secondary | ICD-10-CM | POA: Diagnosis not present

## 2017-07-10 DIAGNOSIS — M5412 Radiculopathy, cervical region: Secondary | ICD-10-CM | POA: Diagnosis not present

## 2017-07-11 DIAGNOSIS — M9903 Segmental and somatic dysfunction of lumbar region: Secondary | ICD-10-CM | POA: Diagnosis not present

## 2017-07-11 DIAGNOSIS — M9901 Segmental and somatic dysfunction of cervical region: Secondary | ICD-10-CM | POA: Diagnosis not present

## 2017-07-11 DIAGNOSIS — M9902 Segmental and somatic dysfunction of thoracic region: Secondary | ICD-10-CM | POA: Diagnosis not present

## 2017-07-11 DIAGNOSIS — M5412 Radiculopathy, cervical region: Secondary | ICD-10-CM | POA: Diagnosis not present

## 2017-07-12 DIAGNOSIS — M9903 Segmental and somatic dysfunction of lumbar region: Secondary | ICD-10-CM | POA: Diagnosis not present

## 2017-07-12 DIAGNOSIS — M9902 Segmental and somatic dysfunction of thoracic region: Secondary | ICD-10-CM | POA: Diagnosis not present

## 2017-07-12 DIAGNOSIS — M5412 Radiculopathy, cervical region: Secondary | ICD-10-CM | POA: Diagnosis not present

## 2017-07-12 DIAGNOSIS — M9901 Segmental and somatic dysfunction of cervical region: Secondary | ICD-10-CM | POA: Diagnosis not present

## 2017-07-16 DIAGNOSIS — M9903 Segmental and somatic dysfunction of lumbar region: Secondary | ICD-10-CM | POA: Diagnosis not present

## 2017-07-16 DIAGNOSIS — M9902 Segmental and somatic dysfunction of thoracic region: Secondary | ICD-10-CM | POA: Diagnosis not present

## 2017-07-16 DIAGNOSIS — M5412 Radiculopathy, cervical region: Secondary | ICD-10-CM | POA: Diagnosis not present

## 2017-07-16 DIAGNOSIS — M9901 Segmental and somatic dysfunction of cervical region: Secondary | ICD-10-CM | POA: Diagnosis not present

## 2017-07-18 DIAGNOSIS — M9902 Segmental and somatic dysfunction of thoracic region: Secondary | ICD-10-CM | POA: Diagnosis not present

## 2017-07-18 DIAGNOSIS — M5412 Radiculopathy, cervical region: Secondary | ICD-10-CM | POA: Diagnosis not present

## 2017-07-18 DIAGNOSIS — M9901 Segmental and somatic dysfunction of cervical region: Secondary | ICD-10-CM | POA: Diagnosis not present

## 2017-07-18 DIAGNOSIS — M9903 Segmental and somatic dysfunction of lumbar region: Secondary | ICD-10-CM | POA: Diagnosis not present

## 2017-07-19 DIAGNOSIS — M5412 Radiculopathy, cervical region: Secondary | ICD-10-CM | POA: Diagnosis not present

## 2017-07-19 DIAGNOSIS — M9903 Segmental and somatic dysfunction of lumbar region: Secondary | ICD-10-CM | POA: Diagnosis not present

## 2017-07-19 DIAGNOSIS — M9902 Segmental and somatic dysfunction of thoracic region: Secondary | ICD-10-CM | POA: Diagnosis not present

## 2017-07-19 DIAGNOSIS — M9901 Segmental and somatic dysfunction of cervical region: Secondary | ICD-10-CM | POA: Diagnosis not present

## 2017-07-26 DIAGNOSIS — M542 Cervicalgia: Secondary | ICD-10-CM | POA: Diagnosis not present

## 2017-07-26 DIAGNOSIS — M4712 Other spondylosis with myelopathy, cervical region: Secondary | ICD-10-CM | POA: Diagnosis not present

## 2017-08-07 DIAGNOSIS — M542 Cervicalgia: Secondary | ICD-10-CM | POA: Diagnosis not present

## 2017-08-07 DIAGNOSIS — M4712 Other spondylosis with myelopathy, cervical region: Secondary | ICD-10-CM | POA: Diagnosis not present

## 2017-08-08 ENCOUNTER — Encounter: Payer: Self-pay | Admitting: Family Medicine

## 2017-08-08 ENCOUNTER — Ambulatory Visit (INDEPENDENT_AMBULATORY_CARE_PROVIDER_SITE_OTHER): Payer: PPO | Admitting: Family Medicine

## 2017-08-08 VITALS — BP 124/70 | HR 79 | Temp 98.0°F | Resp 12 | Ht 63.5 in | Wt 196.4 lb

## 2017-08-08 DIAGNOSIS — K529 Noninfective gastroenteritis and colitis, unspecified: Secondary | ICD-10-CM | POA: Diagnosis not present

## 2017-08-08 DIAGNOSIS — R1032 Left lower quadrant pain: Secondary | ICD-10-CM | POA: Diagnosis not present

## 2017-08-08 DIAGNOSIS — R1900 Intra-abdominal and pelvic swelling, mass and lump, unspecified site: Secondary | ICD-10-CM

## 2017-08-08 DIAGNOSIS — M4712 Other spondylosis with myelopathy, cervical region: Secondary | ICD-10-CM | POA: Diagnosis not present

## 2017-08-08 NOTE — Progress Notes (Signed)
ACUTE VISIT   HPI:  Chief Complaint  Patient presents with  . Cyst    knot/hard place on left mid quandrant for a few months now    [name] is a 77 y.o. female, who is here today complaining of LLQ "knot" she noted about 3 weeks ago,tender with palpation. It seems like mass is growing.  LLQ abdominal pain,intermittent for over 6 months. It seems to be worse in the morning but she has pain pain intermittent throughout the day. Can not describe type of pain,"it hurts",no radiated,10/10. Stable overall.  She has not tried OTC treatments.  Abdominal pelvis CT: 1.  Fatty infiltration of the liver.  Status post cholecystectomy. 2.  Nonspecific mild distention of jejunal bowel loops without surrounding inflammation.  Mild enteritis cannot be excluded. No small bowel obstruction.   3.  No hydronephrosis or hydroureter.  Bilateral renal symmetrical excretion. 4.  Normal appendix.  No pericecal inflammation.  Diarrhea,which is chronic.It did not improve after discontinuing Metformin.  Episodes of incontinence when sudden diarrhea presents. Mild lower cramp pain that improves after defecation.  According to pt,her health insurance did not cover colonoscopy.  No blood in stool.  Negative for gross hematuria.  No associated fever,chills, worsening fatigue,or skin rash.  Hx of fibromyalgia and OA. She just had cervical MRI because LUE pain and numbness.EMG is pending.    Review of Systems  Constitutional: Positive for fatigue. Negative for activity change, appetite change and fever.  HENT: Negative for mouth sores, sore throat and trouble swallowing.   Respiratory: Negative for cough, shortness of breath and wheezing.   Gastrointestinal: Positive for abdominal pain and diarrhea. Negative for blood in stool, nausea and vomiting.  Genitourinary: Negative for dysuria, hematuria, vaginal bleeding and vaginal discharge.  Musculoskeletal: Positive for  arthralgias, back pain and neck pain.  Skin: Negative for rash and wound.  Neurological: Positive for numbness. Negative for syncope and headaches.  Hematological: Negative for adenopathy. Does not bruise/bleed easily.  Psychiatric/Behavioral: Negative for confusion. The patient is nervous/anxious.       Current Outpatient Medications on File Prior to Visit  Medication Sig Dispense Refill  . Calcium Citrate-Vitamin D (CALCIUM CITRATE +D PO) Take 1 tablet by mouth at bedtime.     . cholecalciferol (VITAMIN D) 1000 units tablet Take 1,000 Units by mouth daily.    . Cranberry 1000 MG CAPS Take 1 capsule by mouth 2 (two) times daily. + Vit C 40 mg    . digoxin (DIGOX) 0.125 MG tablet Digox 125 mcg tablet    . ezetimibe (ZETIA) 10 MG tablet Take 1 tablet (10 mg total) by mouth daily. 90 tablet 2  . insulin glargine (LANTUS) 100 UNIT/ML injection Inject 0.6 mLs (60 Units total) into the skin at bedtime. 10 mL 3  . loperamide (IMODIUM A-D) 2 MG tablet Take 2 mg by mouth 4 (four) times daily as needed for diarrhea or loose stools.    . metFORMIN (GLUCOPHAGE) 1000 MG tablet Take 1,000 mg by mouth 2 (two) times daily with a meal.     . methocarbamol (ROBAXIN) 500 MG tablet Take 1 tablet (500 mg total) by mouth every 6 (six) hours as needed for muscle spasms. 40 tablet 0  . metoprolol succinate (TOPROL-XL) 25 MG 24 hr tablet Take 1 tablet (25 mg total) by mouth daily. 90 tablet 1  . Multiple Vitamins-Minerals (PRESERVISION AREDS 2 PO) Take 1 capsule by mouth 2 (two) times daily.    Marland Kitchen  nitroGLYCERIN (NITROSTAT) 0.4 MG SL tablet Place 0.4 mg under the tongue every 5 (five) minutes as needed. As needed chest pain    . omeprazole (PRILOSEC) 20 MG capsule Take 1 capsule (20 mg total) by mouth daily. 90 capsule 2  . ramipril (ALTACE) 10 MG capsule Take 1 capsule (10 mg total) by mouth daily. 90 capsule 0  . RELION INSULIN SYRINGE 1ML/31G 31G X 5/16" 1 ML MISC Use as directed 100 each 1  . rOPINIRole (REQUIP)  1 MG tablet Take 1 tablet (1 mg total) by mouth at bedtime. 90 tablet 1  . sertraline (ZOLOFT) 100 MG tablet Take 1 tablet (100 mg total) by mouth daily. 30 tablet 4  . verapamil (VERELAN PM) 120 MG 24 hr capsule Take 120 mg by mouth daily as needed (tachycardia).     . verapamil (VERELAN PM) 240 MG 24 hr capsule Take 1 capsule (240 mg total) by mouth daily. 30 capsule 2   No current facility-administered medications on file prior to visit.      Past Medical History:  Diagnosis Date  . Anxiety   . Arthritis   . Complication of anesthesia   . Depression   . Dysrhythmia    Paroysmal Atrial Tachycardia  . Essential hypertension   . Fibromyalgia   . GERD (gastroesophageal reflux disease)   . Hyperlipemia   . PONV (postoperative nausea and vomiting)   . PSVT (paroxysmal supraventricular tachycardia) ([place])   . Reflux   . Restless leg syndrome   . Type 2 diabetes mellitus (HCC)    Allergies  Allergen Reactions  . Amoxil [Amoxicillin] Swelling and Other (See Comments)    Lips Has patient had a PCN reaction causing immediate rash, facial/tongue/throat swelling, SOB or lightheadedness with hypotension:YES Has patient had a PCN reaction causing severe rash involving mucus membranes or skin necrosis:No Has patient had a PCN reaction that required hospitalization:No Has patient had a PCN reaction occurring within the last 10 years:Yes. If all of the above answers are "NO", then may proceed with Cephalosporin use.   . Chocolate Hives  . Demerol Nausea And Vomiting  . Lisinopril Itching and Swelling  . Meperidine Hcl     Social History   Socioeconomic History  . Marital status: Widowed    Spouse name: Glendell Docker  . Number of children: 2  . Years of education: Not on file  . Highest education level: Not on file  Occupational History  . Not on file  Social Needs  . Financial resource strain: Not on file  . Food insecurity:    Worry: Not on file    Inability: Not on file  .  Transportation needs:    Medical: Not on file    Non-medical: Not on file  Tobacco Use  . Smoking status: Never Smoker  . Smokeless tobacco: Never Used  Substance and Sexual Activity  . Alcohol use: No  . Drug use: No  . Sexual activity: Not on file  Lifestyle  . Physical activity:    Days per week: Not on file    Minutes per session: Not on file  . Stress: Not on file  Relationships  . Social connections:    Talks on phone: Not on file    Gets together: Not on file    Attends religious service: Not on file    Active member of club or organization: Not on file    Attends meetings of clubs or organizations: Not on file    Relationship status:  Not on file  Other Topics Concern  . Not on file  Social History Narrative   Lives with husband.  Independent of ADLs.    Vitals:   08/08/17 1521  BP: 124/70  Pulse: 79  Resp: 12  Temp: 98 F (36.7 C)  SpO2: 97%   Body mass index is 34.24 kg/m.    Physical Exam  Nursing note and vitals reviewed. Constitutional: She is oriented to person, place, and time. She appears well-developed. She does not appear ill. No distress.  HENT:  Head: Normocephalic and atraumatic.  Mouth/Throat: Oropharynx is clear and moist and mucous membranes are normal.  Eyes: Conjunctivae are normal. No scleral icterus.  Cardiovascular: Normal rate and regular rhythm.  No murmur heard. Respiratory: Effort normal and breath sounds normal. No respiratory distress.  GI: Soft. Bowel sounds are normal. She exhibits mass. She exhibits no distension. There is no hepatomegaly. There is tenderness in the left lower quadrant. There is no rigidity, no rebound and no guarding.    Upon deep palpation there is pain in LLQ. There is a nodular, not mobile lesion, medial to iliac crest.  Musculoskeletal: She exhibits edema (Trace pitting LEV edema,bilateral.).  Lymphadenopathy:    She has no cervical adenopathy.  Neurological: She is alert and oriented to person,  place, and time. She has normal strength. Gait normal.  Skin: Skin is warm. No rash noted. No erythema.  Psychiatric: Her mood appears anxious. Her affect is labile. She expresses no suicidal ideation.  Fairly groomed, good eye contact.      ASSESSMENT AND PLAN:    Ms. Viviana was seen today for cyst.  Diagnoses and all orders for this visit:  LLQ abdominal pain  Possible causes discussed. ? IBS,diverticulosis among some to consider. Instructed about warning signs. Further recommendations will be given according to imaging results.  Chronic diarrhea  Stable over all. ? IBS-D. Non residual diet recommended.  -     Basic metabolic panel  Intra-abdominal and pelvic swelling, mass and lump, unspecified site  Not sure if nodular lesion is originating on iliac crest. It is tender and reporting growth, so pelvic/abdominal CT will be arranged.  -     CT ABDOMEN PELVIS W CONTRAST; Future      Return if symptoms worsen or fail to improve, for Keep next appt.      Djimon Lundstrom G. [name], MD  [facility]. [place] office.

## 2017-08-08 NOTE — Patient Instructions (Addendum)
A few things to remember from today's visit:   Chronic diarrhea - Plan: Basic metabolic panel  Intra-abdominal and pelvic swelling, mass and lump, unspecified site - Plan: CT ABDOMEN PELVIS W CONTRAST  Non residual diet may help with diarrhea.    Please be sure medication list is accurate. If a new problem present, please set up appointment sooner than planned today.

## 2017-08-09 LAB — BASIC METABOLIC PANEL
BUN: 23 mg/dL (ref 6–23)
CO2: 20 mEq/L (ref 19–32)
Calcium: 9.8 mg/dL (ref 8.4–10.5)
Chloride: 104 mEq/L (ref 96–112)
Creatinine, Ser: 0.98 mg/dL (ref 0.40–1.20)
GFR: 58.81 mL/min — ABNORMAL LOW (ref >60.00)
Glucose, Bld: 112 mg/dL — ABNORMAL HIGH (ref 70–99)
Potassium: 4.6 mEq/L (ref 3.5–5.1)
Sodium: 136 mEq/L (ref 135–145)

## 2017-08-10 ENCOUNTER — Ambulatory Visit (INDEPENDENT_AMBULATORY_CARE_PROVIDER_SITE_OTHER)
Admission: RE | Admit: 2017-08-10 | Discharge: 2017-08-10 | Disposition: A | Discharge status: Home / Self Care | Payer: PPO | Source: Ambulatory Visit | Attending: Family Medicine | Admitting: Family Medicine

## 2017-08-10 ENCOUNTER — Encounter: Payer: Self-pay | Admitting: Family Medicine

## 2017-08-10 ENCOUNTER — Telehealth: Payer: Self-pay | Admitting: Family Medicine

## 2017-08-10 DIAGNOSIS — R1032 Left lower quadrant pain: Secondary | ICD-10-CM | POA: Diagnosis not present

## 2017-08-10 DIAGNOSIS — R1900 Intra-abdominal and pelvic swelling, mass and lump, unspecified site: Secondary | ICD-10-CM

## 2017-08-10 MED ORDER — IOPAMIDOL (ISOVUE-300) INJECTION 61%
100.0000 mL | Freq: Once | INTRAVENOUS | Status: AC | PRN
  Administered 2017-08-10: 100 mL via INTRAVENOUS

## 2017-08-10 NOTE — Telephone Encounter (Signed)
Copied from [place] [id number]. Topic: Inquiry >> Aug 10, 2017 11:09 AM Margot Ables wrote: Pt had CT scan today w/contrast. Pt was advised not to take metformin today or for a few days and to check with physician on when to restart medication. Please advise.

## 2017-08-10 NOTE — Telephone Encounter (Signed)
Tried calling patient, unable to leave message, vm not set up. Will try again later.

## 2017-08-10 NOTE — Telephone Encounter (Signed)
Message sent to Dr. Jordan for review. 

## 2017-08-10 NOTE — Telephone Encounter (Signed)
Patient returned called and she was informed to take Metformin 48 hours after CT scan. Patient verbalized understanding.

## 2017-08-10 NOTE — Telephone Encounter (Signed)
Called patient again and was able to leave a detailed message to restart metformin in 48 hours.

## 2017-08-10 NOTE — Telephone Encounter (Signed)
She can resume metformin in 48 hours.  Thanks, BJ

## 2017-08-11 ENCOUNTER — Encounter: Payer: Self-pay | Admitting: Family Medicine

## 2017-08-13 ENCOUNTER — Encounter: Payer: Self-pay | Admitting: Physical Therapy

## 2017-08-13 ENCOUNTER — Ambulatory Visit: Payer: PPO | Attending: Neurological Surgery | Admitting: Physical Therapy

## 2017-08-13 ENCOUNTER — Ambulatory Visit: Payer: PPO | Admitting: Physical Therapy

## 2017-08-13 ENCOUNTER — Other Ambulatory Visit: Payer: Self-pay | Admitting: Family Medicine

## 2017-08-13 DIAGNOSIS — M5412 Radiculopathy, cervical region: Secondary | ICD-10-CM | POA: Diagnosis not present

## 2017-08-13 DIAGNOSIS — M542 Cervicalgia: Secondary | ICD-10-CM | POA: Diagnosis not present

## 2017-08-13 DIAGNOSIS — G5612 Other lesions of median nerve, left upper limb: Secondary | ICD-10-CM | POA: Diagnosis not present

## 2017-08-13 DIAGNOSIS — M6281 Muscle weakness (generalized): Secondary | ICD-10-CM | POA: Insufficient documentation

## 2017-08-13 DIAGNOSIS — G5622 Lesion of ulnar nerve, left upper limb: Secondary | ICD-10-CM | POA: Diagnosis not present

## 2017-08-13 NOTE — Therapy (Addendum)
[facility] [place], [place], [address] Phone: [phone]   Fax:  [phone]  Physical Therapy Treatment  Patient Details  Name: TANAKA GILLEN MRN: [id number] Date of Birth: 1941/01/31 Referring Provider: Karene Fry MD   Encounter Date: 08/13/2017  PT End of Session - 08/13/17 1430    Visit Number  1    Number of Visits  6    Date for PT Re-Evaluation  10/01/17    PT Start Time  6761    PT Stop Time  1430    PT Time Calculation (min)  45 min    Activity Tolerance  Patient tolerated treatment well    Behavior During Therapy  [facility] for tasks assessed/performed       Past Medical History:  Diagnosis Date  . Anxiety   . Arthritis   . Complication of anesthesia   . Depression   . Dysrhythmia    Paroysmal Atrial Tachycardia  . Essential hypertension   . Fibromyalgia   . GERD (gastroesophageal reflux disease)   . Hyperlipemia   . PONV (postoperative nausea and vomiting)   . PSVT (paroxysmal supraventricular tachycardia) ([place])   . Reflux   . Restless leg syndrome   . Type 2 diabetes mellitus ([place])     Past Surgical History:  Procedure Laterality Date  . ABDOMINAL HYSTERECTOMY    . ANKLE SURGERY    . BACK SURGERY     T12-L1 fusion, Anterior Cervical fusion  . CARDIAC CATHETERIZATION N/A 02/16/2015   Procedure: Left Heart Cath and Coronary Angiography;  Surgeon: Sherren Mocha, MD;  Location: [facility];  Service: Cardiovascular;  Laterality: N/A;  . CHOLECYSTECTOMY    . COLONOSCOPY    . ESOPHAGOGASTRODUODENOSCOPY  12/21/2010   Procedure: ESOPHAGOGASTRODUODENOSCOPY (EGD);  Surgeon: Landry Dyke, MD;  Location: [facility];  Service: Endoscopy;  Laterality: N/A;  . EXCISION MORTON'S NEUROMA    . FRACTURE SURGERY    . KNEE SURGERY     x 3  . NECK SURGERY    . ROTATOR CUFF REPAIR    . TONSILLECTOMY    . TOTAL KNEE ARTHROPLASTY Left 04/10/2017   Procedure: LEFT TOTAL KNEE ARTHROPLASTY;  Surgeon:  Paralee Cancel, MD;  Location: [facility];  Service: Orthopedics;  Laterality: Left;  70 mins  . UPPER GASTROINTESTINAL ENDOSCOPY    . WRIST SURGERY      There were no vitals filed for this visit.  Subjective Assessment - 08/13/17 2244    Subjective  Patient arrives to physical therapy with reports of constant neck pain that radiates to elbow. Patient reports symptoms began 6 weeks ago; she woke up with 10/10 left sided neck pain that radiated to fingers and caused cramping in index finger. Patient continues to have a cramped index finger that limits functional fine motor tasks. Patient reports seeing a chiropractor which helped alleviate some pain but reports pain remains at best as 2/10. Patient reports decreased strength in L hand as well as numbness in 4th and 5th fingers. Patient reports she will have a nerve conduction study performed today, 08/13/17.  Patient's goals are to decrease pain, improve function and to return to recreational hobby of sewing.     Pertinent History  C4-C6 spinal fusion, right RTC, left wrist and left ankle surgery.  Athritis and DM, HTN, Fibromyalgia    Limitations  House hold activities;Lifting fine motor tasks    Patient Stated Goals  Get hand strength back and sew again  Currently in Pain?  Yes    Pain Score  2     Pain Location  Neck    Pain Orientation  Left    Pain Descriptors / Indicators  Sharp;Tingling    Pain Type  Chronic pain    Pain Radiating Towards  left elbow    Pain Onset  More than a month ago    Pain Frequency  Constant    Aggravating Factors   increased activity    Pain Relieving Factors  rest    Effect of Pain on Daily Activities  difficulty with fine motor tasks secondary to L hand weakness         [facility] PT Assessment - 08/13/17 0001      Assessment   Medical Diagnosis  Cervical Spondylosis with myelopathy; left hand weaknes    Referring Provider  Karene Fry MD    Onset Date/Surgical Date  -- 6 weeks ago    Hand Dominance  Right     Next MD Visit  July 25    Prior Therapy  Yes, for knees      Precautions   Precautions  None      Balance Screen   Has the patient fallen in the past 6 months  No    Has the patient had a decrease in activity level because of a fear of falling?   No    Is the patient reluctant to leave their home because of a fear of falling?   No      Home Film/video editor residence      Prior Function   Level of Independence  Independent      Sensation   Light Touch  Impaired by gross assessment numb left 4th and 5th fingers      Posture/Postural Control   Posture/Postural Control  Postural limitations    Postural Limitations  Rounded Shoulders;Forward head;Increased thoracic kyphosis      ROM / Strength   AROM / PROM / Strength  Strength;AROM      AROM   Overall AROM   Due to pain    AROM Assessment Site  Cervical    Cervical Flexion  20    Cervical Extension  30    Cervical - Right Rotation  36    Cervical - Left Rotation  42      Strength   Overall Strength  Within functional limits for tasks performed    Overall Strength Comments  Grip strength: Right: 40 lbs of force, Left: 0 lbs of force (unable to detect grip strength)    Strength Assessment Site  Shoulder    Right/Left Shoulder  Left    Left Shoulder Flexion  4/5    Left Shoulder ABduction  4+/5    Left Shoulder Internal Rotation  4+/5    Left Shoulder External Rotation  4+/5      Palpation   Palpation comment  tenderness to palpation along L cervical paraspinals, L UT, and upper thoracic paraspinals      Special Tests    Special Tests  Thoracic Outlet Syndrome    Other special tests  (+) Froment's sign    Thoracic Outlet Syndrome   Adson Test      Adson Test   Findings  Negative    Side   Left  PT Long Term Goals - 08/13/17 2259      PT LONG TERM GOAL #1   Title  Patient will be independent with HEP    Time  4    Period  Weeks     Status  New      PT LONG TERM GOAL #2   Title  Patient will demonstrate 50+ degrees of cervical rotation AROM to scan environment.    Time  4    Period  Weeks    Status  New      PT LONG TERM GOAL #3   Title  Patient will demonstrate 10+ lbs of left grip strength to improve fine motor tasks.    Time  4    Period  Weeks    Status  New      PT LONG TERM GOAL #4   Title  --      PT LONG TERM GOAL #5   Title  --      PT LONG TERM GOAL #6   Title  --            Plan - 08/13/17 2256    Clinical Impression Statement  Patient is a 77 year old female who presents to physical therapy with left sided cervical pain, decreased cervical AROM, and decreased left grip strength. Patient's left grip strength was undetectable through  the hand held dynamometer. Patient (+) for numbness in left 4th and 5th fingers. Patient demonstrated decreased triceps DTR and normal biceps and brachioradialis DTR. Patient noted with forward head, rounded shoulders, and increased thoracic kyphosis. Patient tender to palpation along upper left cervical paraspinals, left upper trapezius, and upper thoracic spine. Patient (-) for Adson's test for thoracic outlet syndrome and (+) Froment's sign which may indicate ulnar nerve palsy. Patient may benefit from skilled physical therapy to address deficits and address patient's goals.     Clinical Presentation  Evolving    Clinical Presentation due to:  no improvement    Clinical Decision Making  Moderate    Rehab Potential  Good    PT Frequency  2x / week 2x for 1 week, 1x for 4 weeks    PT Duration  6 weeks    PT Treatment/Interventions  ADLs/Self Care Home Management;Cryotherapy;Electrical Stimulation;Gait training;Stair training;Functional mobility training;Therapeutic activities;Therapeutic exercise;Neuromuscular re-education;Patient/family education;Passive range of motion;Manual techniques;Vasopneumatic Device    PT Next Visit Plan  chin tucks, cervical AROM,  postural exercises, modalities for pain relief.    Consulted and Agree with Plan of Care  Patient       Patient will benefit from skilled therapeutic intervention in order to improve the following deficits and impairments:  Pain, Impaired UE functional use, Decreased strength, Decreased range of motion  Visit Diagnosis: Cervicalgia  Radiculopathy, cervical region  Muscle weakness (generalized)     Problem List Patient Active Problem List   Diagnosis Date Noted  . Chronic diarrhea 08/08/2017  . S/P left TKA 04/10/2017  . Unstable gait 01/12/2017  . Generalized osteoarthritis of hand 01/12/2017  . RLS (restless legs syndrome) 11/09/2016  . GERD (gastroesophageal reflux disease) 07/13/2016  . Class 2 severe obesity due to excess calories with serious comorbidity and body mass index (BMI) of 36.0 to 36.9 in adult ([place]) 06/01/2016  . Vitamin D deficiency 06/01/2016  . Vitamin B12 deficiency 03/27/2016  . Chronic cough 03/16/2016  . Left foot pain 03/16/2016  . Demand ischemia ([place]) 02/14/2015  . Degenerative drusen 07/13/2013  . Type 2 diabetes mellitus without ophthalmic  manifestations ([place]) 07/13/2013  . DM (diabetes mellitus) type II controlled, neurological manifestation ([place]) 05/07/2012  . Paroxysmal atrial tachycardia ([place]) 05/07/2012  . Essential hypertension, benign 05/07/2012  . Depression 05/07/2012  . Hyperlipidemia 05/07/2012  . Fibromyalgia 05/07/2012    PHYSICAL THERAPY DISCHARGE SUMMARY  Visits from Start of Care: 1  Current functional level related to goals / functional outcomes: See above   Remaining deficits: See goals   Education / Equipment: hep   Plan: Patient agrees to discharge.  Patient goals were not met. Patient is being discharged due to the patient's request.  ?????      Gabriela Eves, PT, DPT 08/13/2017, 11:05 PM  [facility] [place], [place], [address] Phone:  [phone]   Fax:  [phone]  Name: VAISHNAVI DALBY MRN: [id number] Date of Birth: Feb 16, 1940

## 2017-08-15 DIAGNOSIS — M5412 Radiculopathy, cervical region: Secondary | ICD-10-CM | POA: Diagnosis not present

## 2017-08-15 DIAGNOSIS — Z6833 Body mass index (BMI) 33.0-33.9, adult: Secondary | ICD-10-CM | POA: Diagnosis not present

## 2017-08-15 DIAGNOSIS — I10 Essential (primary) hypertension: Secondary | ICD-10-CM | POA: Diagnosis not present

## 2017-08-20 ENCOUNTER — Encounter: Payer: Medicare Other | Admitting: Physical Therapy

## 2017-08-23 ENCOUNTER — Other Ambulatory Visit: Payer: Self-pay | Admitting: Neurological Surgery

## 2017-08-23 ENCOUNTER — Encounter: Payer: Medicare Other | Admitting: Physical Therapy

## 2017-09-03 NOTE — Pre-Procedure Instructions (Signed)
CAI FLOTT  09/03/2017    Your procedure is scheduled on Tuesday, September 11, 2017 at 12:30 PM.   Report to [facility] Entrance "A" Admitting Office at 10:30 AM.   Call this number if you have problems the morning of surgery: [phone]   Questions prior to day of surgery, please call [phone] between 8 & 4 PM.   Remember:  Do not eat or drink after midnight Monday, 09/10/17.  Take these medicines the morning of surgery with A SIP OF WATER: Digoxin (Lanoxin), Metoprolol (Toprol XL), Verapamil (Verelan PM) - if needed, Tylenol - if needed  Stop Aspirin as instructed by physician or surgeon. Stop Multivitamins, Herbal medications and NSAIDS (Ibuprofen, Aleve, etc) as of today. Do not use Aspirin products prior to surgery.  Monday evening (the 29th) take 1/2 of your regular dose of Lantus (you will take 30 units). Do not take Metformin the morning of surgery.   How to Manage Your Diabetes Before Surgery   Why is it important to control my blood sugar before and after surgery?   Improving blood sugar levels before and after surgery helps healing and can limit problems.  A way of improving blood sugar control is eating a healthy diet by:  - Eating less sugar and carbohydrates  - Increasing activity/exercise  - Talk with your doctor about reaching your blood sugar goals  High blood sugars (greater than 180 mg/dL) can raise your risk of infections and slow down your recovery so you will need to focus on controlling your diabetes during the weeks before surgery.  Make sure that the doctor who takes care of your diabetes knows about your planned surgery including the date and location.  How do I manage my blood sugars before surgery?   Check your blood sugar at least 4 times a day, 2 days before surgery to make sure that they are not too high or low.  Check your blood sugar the morning of your surgery when you wake up and every 2 hours until you get to the Short-Stay  unit.  Treat a low blood sugar (less than 70 mg/dL) with 1/2 cup of clear juice (cranberry or apple), 4 glucose tablets, OR glucose gel.  Recheck blood sugar in 15 minutes after treatment (to make sure it is greater than 70 mg/dL).  If blood sugar is not greater than 70 mg/dL on re-check, call [phone] for further instructions.   Report your blood sugar to the Short-Stay nurse when you get to Short-Stay.  References:  [facility], 2007 "How to Manage your Diabetes Before and After Surgery".    Do not wear jewelry, make-up or nail polish.  Do not wear lotions, powders, perfumes or deodorant.  Do not shave 48 hours prior to surgery.    Do not bring valuables to the hospital.  [facility] is not responsible for any belongings or valuables.  Contacts, dentures or bridgework may not be worn into surgery.  Leave your suitcase in the car.  After surgery it may be brought to your room.  For patients admitted to the hospital, discharge time will be determined by your treatment team.  [facility] - Preparing for Surgery  Before surgery, you can play an important role.  Because skin is not sterile, your skin needs to be as free of germs as possible.  You can reduce the number of germs on you skin by washing with CHG (chlorahexidine gluconate) soap before surgery.  CHG is  an antiseptic cleaner which kills germs and bonds with the skin to continue killing germs even after washing.  Oral Hygiene is also important in reducing the risk of infection.  Remember to brush your teeth with your regular toothpaste the morning of surgery.  Please DO NOT use if you have an allergy to CHG or antibacterial soaps.  If your skin becomes reddened/irritated stop using the CHG and inform your nurse when you arrive at Short Stay.  Do not shave (including legs and underarms) for at least 48 hours prior to the first CHG shower.  You may shave your face.  Please follow these instructions  carefully:   1.  Shower with CHG Soap the night before surgery and the morning of Surgery.  2.  If you choose to wash your hair, wash your hair first as usual with your normal shampoo.  3.  After you shampoo, rinse your hair and body thoroughly to remove the shampoo. 4.  Use CHG as you would any other liquid soap.  You can apply chg directly to the skin and wash gently with a      scrungie or washcloth.           5.  Apply the CHG Soap to your body ONLY FROM THE NECK DOWN.   Do not use on open wounds or open sores. Avoid contact with your eyes, ears, mouth and genitals (private parts).  Wash genitals (private parts) with your normal soap.  6.  Wash thoroughly, paying special attention to the area where your surgery will be performed.  7.  Thoroughly rinse your body with warm water from the neck down.  8.  DO NOT shower/wash with your normal soap after using and rinsing off the CHG Soap.  9.  Pat yourself dry with a clean towel.            10.  Wear clean pajamas.            11.  Place clean sheets on your bed the night of your first shower and do not sleep with pets.  Day of Surgery  Shower as above. Do not apply any lotions/deodorants the morning of surgery.   Please wear clean clothes to the hospital. Remember to brush your teeth with toothpaste.   Please read over the fact sheets that you were given.

## 2017-09-04 ENCOUNTER — Other Ambulatory Visit: Payer: Self-pay

## 2017-09-04 ENCOUNTER — Encounter (HOSPITAL_COMMUNITY)
Admission: RE | Admit: 2017-09-04 | Discharge: 2017-09-04 | Disposition: A | Discharge status: Home / Self Care | Payer: PPO | Source: Ambulatory Visit | Attending: Neurological Surgery | Admitting: Neurological Surgery

## 2017-09-04 ENCOUNTER — Encounter (HOSPITAL_COMMUNITY): Payer: Self-pay

## 2017-09-04 DIAGNOSIS — Z01812 Encounter for preprocedural laboratory examination: Secondary | ICD-10-CM | POA: Diagnosis not present

## 2017-09-04 DIAGNOSIS — E119 Type 2 diabetes mellitus without complications: Secondary | ICD-10-CM | POA: Diagnosis not present

## 2017-09-04 DIAGNOSIS — Z01810 Encounter for preprocedural cardiovascular examination: Secondary | ICD-10-CM | POA: Insufficient documentation

## 2017-09-04 HISTORY — DX: Personal history of other diseases of the digestive system: Z87.19

## 2017-09-04 HISTORY — DX: Pneumonia, unspecified organism: J18.9

## 2017-09-04 HISTORY — DX: Functional diarrhea: K59.1

## 2017-09-04 HISTORY — DX: Anemia, unspecified: D64.9

## 2017-09-04 LAB — CBC
HCT: 42 % (ref 36.0–46.0)
Hemoglobin: 13.4 g/dL (ref 12.0–15.0)
MCH: 28.5 pg (ref 26.0–34.0)
MCHC: 31.9 g/dL (ref 30.0–36.0)
MCV: 89.4 fL (ref 78.0–100.0)
Platelets: 213 10*3/uL (ref 150–400)
RBC: 4.7 MIL/uL (ref 3.87–5.11)
RDW: 14.7 % (ref 11.5–15.5)
WBC: 9.7 10*3/uL (ref 4.0–10.5)

## 2017-09-04 LAB — BASIC METABOLIC PANEL
Anion gap: 8 (ref 5–15)
BUN: 21 mg/dL (ref 8–23)
CO2: 21 mmol/L — ABNORMAL LOW (ref 22–32)
Calcium: 9.3 mg/dL (ref 8.9–10.3)
Chloride: 108 mmol/L (ref 98–111)
Creatinine, Ser: 0.91 mg/dL (ref 0.44–1.00)
GFR calc Af Amer: >60 mL/min (ref >60)
GFR calc non Af Amer: >60 mL/min (ref >60)
Glucose, Bld: 143 mg/dL — ABNORMAL HIGH (ref 70–99)
Potassium: 4.3 mmol/L (ref 3.5–5.1)
Sodium: 137 mmol/L (ref 135–145)

## 2017-09-04 LAB — SURGICAL PCR SCREEN
MRSA, PCR: NEGATIVE
Staphylococcus aureus: NEGATIVE

## 2017-09-04 LAB — HEMOGLOBIN A1C
Hgb A1c MFr Bld: 6.7 % — ABNORMAL HIGH (ref 4.8–5.6)
Mean Plasma Glucose: 145.59 mg/dL

## 2017-09-04 LAB — GLUCOSE, CAPILLARY: Glucose-Capillary: 119 mg/dL — ABNORMAL HIGH (ref 70–99)

## 2017-09-04 NOTE — Progress Notes (Signed)
Pt has hx of Paroxymal Atrial Tachycardia. Pt's cardiologist is Dr. Wynonia Lawman. Pt denies any recent chest pain or sob. Pt is a type 2 diabetic. Last A1C was 6.8 several months ago. Pt states her fasting blood sugar is usually less than 100. Pt also states she wants someone to find the surgery that Dr. Tobias Alexander was the anesthesiologist because he found "the perfect combo for her N/V".

## 2017-09-04 NOTE — Progress Notes (Signed)
Subjective:   [name] is a 77 y.o. female who presents for Medicare Annual (Initial ) preventive examination.  Reports health as compromised today  Type 2 DM A1c 6.7   Diet BMK 35  Breakfast shredded wheat frosted Drinks cranberry juice or black cherry juice Lunch; hamburger steak, potato salad and M and Cheese Supper; brussels sprouts  or veg with corn bread, or fish Diarrhea not associated with food. States her stomach does not cramp prior to the diarrhea x on occasion   Exercise Likes to sew and embroidery  Has had recent TKR and was scheduled for neuro surgery on back tomorrow but insurer denied it  Health Maintenance Due  Topic Date Due  . OPHTHALMOLOGY EXAM  08/21/1952  . TETANUS/TDAP  08/21/1961   Colonoscopy due 12/2020 - Dr. Paulita Fujita with [facility]  Dexa 10/2008 - do not see score  Mammogram was 02/15/2016  She will schedule her mammogram when feeling better   Cardiac Risk Factors include: advanced age (>58men, >42 women);diabetes mellitus;dyslipidemia;family history of premature cardiovascular disease;hypertension;sedentary lifestyle;obesity (BMI >30kg/m2)     Objective:     Vitals: BP 124/78   Pulse 71   Ht 5\' 3"  (1.6 m)   Wt 200 lb (90.7 kg)   SpO2 98%   BMI 35.43 kg/m   Body mass index is 35.43 kg/m.  Advanced Directives 09/05/2017 09/04/2017 04/10/2017 04/05/2017 03/20/2017 01/25/2017 05/17/2016  Does Patient Have a Medical Advance Directive? Yes Yes Yes Yes Yes Yes Yes  Type of Advance Directive - Living will Living will;Healthcare Power of Attorney Living will;Healthcare Power of [place]  Does patient want to make changes to medical advance directive? - No - Patient declined No - Patient declined No - Patient declined - - -  Copy of [place] in Chart? - - No - copy requested No - copy requested - - No - copy requested  Would patient like information on creating a medical advance directive? - - - - - - -   Pre-existing out of facility DNR order (yellow form or pink MOST form) - - - - - - -    Tobacco Social History   Tobacco Use  Smoking Status Never Smoker  Smokeless Tobacco Never Used     Counseling given: Yes   Clinical Intake:    Past Medical History:  Diagnosis Date  . Anemia    during 1st pregnancy  . Anxiety   . Arthritis   . Complication of anesthesia   . Depression   . Diarrhea, functional   . Dysrhythmia    Paroysmal Atrial Tachycardia  . Essential hypertension   . Fibromyalgia   . GERD (gastroesophageal reflux disease)   . H/O acute pancreatitis   . History of hiatal hernia   . Hyperlipemia   . Pneumonia   . PONV (postoperative nausea and vomiting)   . PSVT (paroxysmal supraventricular tachycardia) ([place])   . Reflux   . Restless leg syndrome   . Type 2 diabetes mellitus ([place])    Past Surgical History:  Procedure Laterality Date  . ABDOMINAL HYSTERECTOMY    . ANKLE SURGERY    . BACK SURGERY     T12-L1 fusion, Anterior Cervical fusion  . CARDIAC CATHETERIZATION N/A 02/16/2015   Procedure: Left Heart Cath and Coronary Angiography;  Surgeon: Sherren Mocha, MD;  Location: [facility];  Service: Cardiovascular;  Laterality: N/A;  . CHOLECYSTECTOMY    . COLONOSCOPY    .  ESOPHAGOGASTRODUODENOSCOPY  12/21/2010   Procedure: ESOPHAGOGASTRODUODENOSCOPY (EGD);  Surgeon: Landry Dyke, MD;  Location: [facility];  Service: Endoscopy;  Laterality: N/A;  . EXCISION MORTON'S NEUROMA    . FRACTURE SURGERY    . KNEE SURGERY     x 3  . NECK SURGERY    . ROTATOR CUFF REPAIR    . TONSILLECTOMY    . TOTAL KNEE ARTHROPLASTY Left 04/10/2017   Procedure: LEFT TOTAL KNEE ARTHROPLASTY;  Surgeon: Paralee Cancel, MD;  Location: [facility];  Service: Orthopedics;  Laterality: Left;  70 mins  . UPPER GASTROINTESTINAL ENDOSCOPY    . WRIST SURGERY     Family History  Problem Relation Age of Onset  . Heart failure Mother   . Hyperlipidemia Mother   . Lung cancer Father          Lymphoma  . Hyperlipidemia Maternal Grandmother   . Peripheral vascular disease Maternal Grandmother    Social History   Socioeconomic History  . Marital status: Widowed    Spouse name: Glendell Docker  . Number of children: 2  . Years of education: Not on file  . Highest education level: Not on file  Occupational History  . Not on file  Social Needs  . Financial resource strain: Not on file  . Food insecurity:    Worry: Not on file    Inability: Not on file  . Transportation needs:    Medical: Not on file    Non-medical: Not on file  Tobacco Use  . Smoking status: Never Smoker  . Smokeless tobacco: Never Used  Substance and Sexual Activity  . Alcohol use: No  . Drug use: No  . Sexual activity: Not on file  Lifestyle  . Physical activity:    Days per week: Not on file    Minutes per session: Not on file  . Stress: Not on file  Relationships  . Social connections:    Talks on phone: Not on file    Gets together: Not on file    Attends religious service: Not on file    Active member of club or organization: Not on file    Attends meetings of clubs or organizations: Not on file    Relationship status: Not on file  Other Topics Concern  . Not on file  Social History Narrative   Lives with husband.  Independent of ADLs.    Outpatient Encounter Medications as of 09/05/2017  Medication Sig  . acetaminophen (TYLENOL) 325 MG tablet Take 325 mg by mouth daily as needed (for pain.).  Marland Kitchen aspirin EC 81 MG tablet Take 81 mg by mouth every evening.  . cholecalciferol (VITAMIN D) 1000 units tablet Take 1,000 Units by mouth daily.  . Cranberry 1000 MG CAPS Take 1 capsule by mouth 2 (two) times daily. + Vit C 40 mg  . digoxin (LANOXIN) 0.125 MG tablet Take 0.125 mg by mouth daily.  Marland Kitchen ezetimibe (ZETIA) 10 MG tablet Take 1 tablet (10 mg total) by mouth daily. (Patient taking differently: Take 10 mg by mouth every evening. )  . ibuprofen (ADVIL,MOTRIN) 200 MG tablet Take 200 mg by mouth  daily as needed (for pain.).  Marland Kitchen insulin glargine (LANTUS) 100 UNIT/ML injection Inject 0.6 mLs (60 Units total) into the skin at bedtime.  . metFORMIN (GLUCOPHAGE) 1000 MG tablet Take 1,000 mg by mouth 2 (two) times daily with a meal.   . metoprolol succinate (TOPROL-XL) 25 MG 24 hr tablet Take 1 tablet (25 mg total) by  mouth daily.  . Multiple Vitamins-Minerals (PRESERVISION AREDS 2 PO) Take 1 capsule by mouth 2 (two) times daily.  . nitroGLYCERIN (NITROSTAT) 0.4 MG SL tablet Place 0.4 mg under the tongue every 5 (five) minutes as needed. As needed chest pain  . omeprazole (PRILOSEC) 10 MG capsule Take 10 mg by mouth at bedtime.  . ramipril (ALTACE) 10 MG capsule TAKE 1 CAPSULE BY MOUTH EVERY DAY. (Patient taking differently: TAKE 1 CAPSULE BY MOUTH EVERY DAY IN THE EVENING.)  . RELION INSULIN SYRINGE 1ML/31G 31G X 5/16" 1 ML MISC Use as directed  . rOPINIRole (REQUIP) 1 MG tablet Take 1 tablet (1 mg total) by mouth at bedtime.  . sertraline (ZOLOFT) 100 MG tablet Take 1 tablet (100 mg total) by mouth daily. (Patient taking differently: Take 100 mg by mouth every evening. )  . verapamil (VERELAN PM) 120 MG 24 hr capsule Take 120 mg by mouth daily as needed (tachycardia).   . verapamil (VERELAN PM) 240 MG 24 hr capsule Take 1 capsule (240 mg total) by mouth daily.  . Calcium Carb-Cholecalciferol (CALCIUM 600+D3 PO) Take 1 tablet by mouth every evening.  . loperamide (IMODIUM A-D) 2 MG tablet Take 2 mg by mouth 4 (four) times daily as needed for diarrhea or loose stools.  . methocarbamol (ROBAXIN) 500 MG tablet Take 1 tablet (500 mg total) by mouth every 6 (six) hours as needed for muscle spasms. (Patient not taking: Reported on 08/29/2017)  . omeprazole (PRILOSEC) 20 MG capsule Take 1 capsule (20 mg total) by mouth daily. (Patient not taking: Reported on 09/05/2017)   No facility-administered encounter medications on file as of 09/05/2017.     Activities of Daily Living In your present state of  health, do you have any difficulty performing the following activities: 09/05/2017 09/04/2017  Hearing? N N  Vision? N N  Difficulty concentrating or making decisions? N N  Walking or climbing stairs? N Y  Comment - due to knee problems and back issues  Dressing or bathing? N N  Doing errands, shopping? N N  Preparing Food and eating ? N -  Using the Toilet? N -  In the past six months, have you accidently leaked urine? N -  Do you have problems with loss of bowel control? Y -  Comment explosive diarrhea in stores etc  -  Managing your Medications? N -  Managing your Finances? N -  Housekeeping or managing your Housekeeping? N -  Some recent data might be hidden    Patient Care Team: [name], [name], MD as PCP - General (Family Medicine)    Assessment:   This is a routine wellness examination for [place].  Exercise Activities and Dietary recommendations Current Exercise Habits: Home exercise routine  Goals    . Patient Stated     To feel better        Fall Risk Fall Risk  09/05/2017 03/27/2016 03/16/2016  Falls in the past year? Yes No No  Number falls in past yr: 2 or more - -  Injury with Fall? Yes - -  Risk Factor Category  High Fall Risk - -  Risk for fall due to : Impaired balance/gait - -  Risk for fall due to: Comment not picking up her feet; stumbled  - -  Follow up Education provided - -     Depression Screen PHQ 2/9 Scores 09/05/2017 03/27/2016 03/16/2016  PHQ - 2 Score 0 0 0     Cognitive Function MMSE -  Mini Mental State Exam 09/05/2017  Not completed: (No Data)   Ad8 score reviewed for issues:  Issues making decisions:  Less interest in hobbies / activities:  Repeats questions, stories (family complaining):  Trouble using ordinary gadgets (microwave, computer, phone):  Forgets the month or year:   Mismanaging finances:   Remembering appts:  Daily problems with thinking and/or memory: Ad8 score is=0          Immunization History    Administered Date(s) Administered  . Influenza, High Dose Seasonal PF 11/10/2016  . Pneumococcal Conjugate-13 08/13/2012  . Pneumococcal Polysaccharide-23 08/23/2007      Screening Tests Health Maintenance  Topic Date Due  . OPHTHALMOLOGY EXAM  08/21/1950  . TETANUS/TDAP  08/21/1961  . INFLUENZA VACCINE  09/13/2017  . HEMOGLOBIN A1C  03/07/2018  . FOOT EXAM  04/09/2018  . COLONOSCOPY  12/20/2020  . DEXA SCAN  Completed  . PNA vac Low Risk Adult  Completed    Plan:     PCP Notes   Health Maintenance Colonoscopy due 12/2020 - Dr. Paulita Fujita with [facility]  Dexa 10/2008 - do not see score  She will schedule her mammogram when feeling better    Abnormal Screens  Multiple falls; had PT post TKR  Education provided; left foot swings out and she will trip on chairs and objects;   Referrals  Needs to call GI for apt   Patient concerns; Diarrhea getting worse; spouse passed x 1.5 yo Goes 3 to 4 times a day and then gets weak  Breakfast shredded wheat frosted Drinks cranberry juice or black cherry juice Lunch; hamburger steak, potato salad and M and Cheese Supper; brussels sprouts  or veg with corn bread, or fish Diarrhea not associated with food. States her stomach does not cramp prior to the diarrhea x on occasion  Request lantus pen for delivery of insulin as she cannot use her hand. Lives alone; using teeth to move injector     Nurse Concerns; The patient was scheduled for neurosurgery tomorrow but this was denied via insurer; the patient will appeal  Next PCP apt Today     I have personally reviewed and noted the following in the patient's chart:   . Medical and social history . Use of alcohol, tobacco or illicit drugs  . Current medications and supplements . Functional ability and status . Nutritional status . Physical activity . Advanced directives . List of other physicians . Hospitalizations, surgeries, and ER visits in previous 12  months . Vitals . Screenings to include cognitive, depression, and falls . Referrals and appointments  In addition, I have reviewed and discussed with patient certain preventive protocols, quality metrics, and best practice recommendations. A written personalized care plan for preventive services as well as general preventive health recommendations were provided to patient.     Wynetta Fines, RN  09/05/2017

## 2017-09-05 ENCOUNTER — Ambulatory Visit (INDEPENDENT_AMBULATORY_CARE_PROVIDER_SITE_OTHER): Payer: PPO | Admitting: Family Medicine

## 2017-09-05 ENCOUNTER — Ambulatory Visit (INDEPENDENT_AMBULATORY_CARE_PROVIDER_SITE_OTHER): Payer: PPO

## 2017-09-05 ENCOUNTER — Encounter: Payer: Self-pay | Admitting: Family Medicine

## 2017-09-05 VITALS — BP 124/78 | HR 71 | Ht 63.0 in | Wt 200.0 lb

## 2017-09-05 VITALS — BP 124/78 | HR 71 | Wt 200.0 lb

## 2017-09-05 DIAGNOSIS — E1140 Type 2 diabetes mellitus with diabetic neuropathy, unspecified: Secondary | ICD-10-CM | POA: Diagnosis not present

## 2017-09-05 DIAGNOSIS — Z794 Long term (current) use of insulin: Secondary | ICD-10-CM | POA: Diagnosis not present

## 2017-09-05 DIAGNOSIS — K529 Noninfective gastroenteritis and colitis, unspecified: Secondary | ICD-10-CM | POA: Diagnosis not present

## 2017-09-05 DIAGNOSIS — Z0000 Encounter for general adult medical examination without abnormal findings: Secondary | ICD-10-CM

## 2017-09-05 NOTE — Patient Instructions (Addendum)
[name] , Thank you for taking time to come for your Medicare Wellness Visit. I appreciate your ongoing commitment to your health goals. Please review the following plan we discussed and let me know if I can assist you in the future.   You will see Dr. Volanda Napoleon today to discuss lantus pen and diarrhea.  Tell your eye doctor to send a copy of your visit to Dr. [name]   Shingrix is a vaccine for the prevention of Shingles in Adults 75 and older.  If you are on Medicare, the shingrix is covered under your Part D plan, so you will take both of the vaccines in the series at your pharmacy. Please check with your benefits regarding applicable copays or out of pocket expenses.  The Shingrix is given in 2 vaccines approx 8 weeks apart. You must receive the 2nd dose prior to 6 months from receipt of the first. Please have the pharmacist print out you Immunization  dates for our office records    The Medicare will pay for a colonoscopy based on Medical necessity for diagnosis of issues per your GI doctor  You screening colonoscopy is for preventive health.  Feels she has had pneumonia at 77 and one at 17. The Centers for Disease Control are now recommending 2 pneumonia vaccinations after 75. The first is the Prevnar 13. This helps to boost your immunity to community acquired pneumonia as well as some protection from bacterial pneumonia  The 2nd is the pneumovax 23, which offers more broad protection!  Please consider taking these as this is your best protection against pneumonia.  A Tetanus is recommended every 10 years. Medicare covers a tetanus if you have a cut or wound; otherwise, there may be a charge. If you had not had a tetanus with pertusses, known as the Tdap, you can take this anytime.       These are the goals we discussed: Goals    . Patient Stated     To feel better        This is a list of the screening recommended for you and due dates:  Health Maintenance  Topic Date Due   . Eye exam for diabetics  [birth year]/1952  . Tetanus Vaccine  08/21/1961  . Pneumonia vaccines (1 of 2 - PCV13) 08/22/2007  . Flu Shot  09/13/2017  . Hemoglobin A1C  03/07/2018  . Complete foot exam   04/09/2018  . Colon Cancer Screening  12/20/2020  . DEXA scan (bone density measurement)  Completed    Prevention of falls: Remove rugs or any tripping hazards in the home Use Non slip mats in bathtubs and showers Placing grab bars next to the toilet and or shower Placing handrails on both sides of the stair way Adding extra lighting in the home.   Personal safety issues reviewed:  1. Consider starting a community watch program per [facility] 2.  Changes batteries is smoke detector and/or carbon monoxide detector  3.  If you have firearms; keep them in a safe place 4.  Wear protection when in the sun; Always wear sunscreen or a hat; It is good to have your doctor check your skin annually or review any new areas of concern 5. Driving safety; Keep in the right lane; stay 3 car lengths behind the car in front of you on the highway; look 3 times prior to pulling out; carry your cell phone everywhere you go!    Learn about the Yellow Dot program:  The program allows first responders at your emergency to have access to who your physician is, as well as your medications and medical conditions.  Citizens requesting the Yellow Dot Packages should contact Master Corporal Nunzio Cobbs at the [facility] [phone] for the first week of the program and beginning the week after Easter citizens should contact their Scientist, physiological.     Fall Prevention in the Home Falls can cause injuries. They can happen to people of all ages. There are many things you can do to make your home safe and to help prevent falls. What can I do on the outside of my home?  Regularly fix the edges of walkways and driveways and fix any cracks.  Remove anything that might make  you trip as you walk through a door, such as a raised step or threshold.  Trim any bushes or trees on the path to your home.  Use bright outdoor lighting.  Clear any walking paths of anything that might make someone trip, such as rocks or tools.  Regularly check to see if handrails are loose or broken. Make sure that both sides of any steps have handrails.  Any raised decks and porches should have guardrails on the edges.  Have any leaves, snow, or ice cleared regularly.  Use sand or salt on walking paths during winter.  Clean up any spills in your garage right away. This includes oil or grease spills. What can I do in the bathroom?  Use night lights.  Install grab bars by the toilet and in the tub and shower. Do not use towel bars as grab bars.  Use non-skid mats or decals in the tub or shower.  If you need to sit down in the shower, use a plastic, non-slip stool.  Keep the floor dry. Clean up any water that spills on the floor as soon as it happens.  Remove soap buildup in the tub or shower regularly.  Attach bath mats securely with double-sided non-slip rug tape.  Do not have throw rugs and other things on the floor that can make you trip. What can I do in the bedroom?  Use night lights.  Make sure that you have a light by your bed that is easy to reach.  Do not use any sheets or blankets that are too big for your bed. They should not hang down onto the floor.  Have a firm chair that has side arms. You can use this for support while you get dressed.  Do not have throw rugs and other things on the floor that can make you trip. What can I do in the kitchen?  Clean up any spills right away.  Avoid walking on wet floors.  Keep items that you use a lot in easy-to-reach places.  If you need to reach something above you, use a strong step stool that has a grab bar.  Keep electrical cords out of the way.  Do not use floor polish or wax that makes floors slippery.  If you must use wax, use non-skid floor wax.  Do not have throw rugs and other things on the floor that can make you trip. What can I do with my stairs?  Do not leave any items on the stairs.  Make sure that there are handrails on both sides of the stairs and use them. Fix handrails that are broken or loose. Make sure that handrails are as long as the stairways.  Check any carpeting  to make sure that it is firmly attached to the stairs. Fix any carpet that is loose or worn.  Avoid having throw rugs at the top or bottom of the stairs. If you do have throw rugs, attach them to the floor with carpet tape.  Make sure that you have a light switch at the top of the stairs and the bottom of the stairs. If you do not have them, ask someone to add them for you. What else can I do to help prevent falls?  Wear shoes that: ? Do not have high heels. ? Have rubber bottoms. ? Are comfortable and fit you well. ? Are closed at the toe. Do not wear sandals.  If you use a stepladder: ? Make sure that it is fully opened. Do not climb a closed stepladder. ? Make sure that both sides of the stepladder are locked into place. ? Ask someone to hold it for you, if possible.  Clearly mark and make sure that you can see: ? Any grab bars or handrails. ? First and last steps. ? Where the edge of each step is.  Use tools that help you move around (mobility aids) if they are needed. These include: ? Canes. ? Walkers. ? Scooters. ? Crutches.  Turn on the lights when you go into a dark area. Replace any light bulbs as soon as they burn out.  Set up your furniture so you have a clear path. Avoid moving your furniture around.  If any of your floors are uneven, fix them.  If there are any pets around you, be aware of where they are.  Review your medicines with your doctor. Some medicines can make you feel dizzy. This can increase your chance of falling. Ask your doctor what other things that you can do to  help prevent falls. This information is not intended to replace advice given to you by your health care provider. Make sure you discuss any questions you have with your health care provider. Document Released: 11/26/2008 Document Revised: 07/08/2015 Document Reviewed: 03/06/2014 Elsevier Interactive Patient Education  2018 [place] Maintenance, Female Adopting a healthy lifestyle and getting preventive care can go a long way to promote health and wellness. Talk with your health care provider about what schedule of regular examinations is right for you. This is a good chance for you to check in with your provider about disease prevention and staying healthy. In between checkups, there are plenty of things you can do on your own. Experts have done a lot of research about which lifestyle changes and preventive measures are most likely to keep you healthy. Ask your health care provider for more information. Weight and diet Eat a healthy diet  Be sure to include plenty of vegetables, fruits, low-fat dairy products, and lean protein.  Do not eat a lot of foods high in solid fats, added sugars, or salt.  Get regular exercise. This is one of the most important things you can do for your health. ? Most adults should exercise for at least 150 minutes each week. The exercise should increase your heart rate and make you sweat (moderate-intensity exercise). ? Most adults should also do strengthening exercises at least twice a week. This is in addition to the moderate-intensity exercise.  Maintain a healthy weight  Body mass index (BMI) is a measurement that can be used to identify possible weight problems. It estimates body fat based on height and weight. Your health care provider can help determine  your BMI and help you achieve or maintain a healthy weight.  For females 74 years of age and older: ? A BMI below 18.5 is considered underweight. ? A BMI of 18.5 to 24.9 is normal. ? A BMI of  25 to 29.9 is considered overweight. ? A BMI of 30 and above is considered obese.  Watch levels of cholesterol and blood lipids  You should start having your blood tested for lipids and cholesterol at 75 years of age, then have this test every 5 years.  You may need to have your cholesterol levels checked more often if: ? Your lipid or cholesterol levels are high. ? You are older than 75 years of age. ? You are at high risk for heart disease.  Cancer screening Lung Cancer  Lung cancer screening is recommended for adults 86-82 years old who are at high risk for lung cancer because of a history of smoking.  A yearly low-dose CT scan of the lungs is recommended for people who: ? Currently smoke. ? Have quit within the past 15 years. ? Have at least a 30-pack-year history of smoking. A pack year is smoking an average of one pack of cigarettes a day for 1 year.  Yearly screening should continue until it has been 15 years since you quit.  Yearly screening should stop if you develop a health problem that would prevent you from having lung cancer treatment.  Breast Cancer  Practice breast self-awareness. This means understanding how your breasts normally appear and feel.  It also means doing regular breast self-exams. Let your health care provider know about any changes, no matter how small.  If you are in your 20s or 30s, you should have a clinical breast exam (CBE) by a health care provider every 1-3 years as part of a regular health exam.  If you are 6 or older, have a CBE every year. Also consider having a breast X-ray (mammogram) every year.  If you have a family history of breast cancer, talk to your health care provider about genetic screening.  If you are at high risk for breast cancer, talk to your health care provider about having an MRI and a mammogram every year.  Breast cancer gene (BRCA) assessment is recommended for women who have family members with BRCA-related  cancers. BRCA-related cancers include: ? Breast. ? Ovarian. ? Tubal. ? Peritoneal cancers.  Results of the assessment will determine the need for genetic counseling and BRCA1 and BRCA2 testing.  Cervical Cancer Your health care provider may recommend that you be screened regularly for cancer of the pelvic organs (ovaries, uterus, and vagina). This screening involves a pelvic examination, including checking for microscopic changes to the surface of your cervix (Pap test). You may be encouraged to have this screening done every 3 years, beginning at age 42.  For women ages 21-65, health care providers may recommend pelvic exams and Pap testing every 3 years, or they may recommend the Pap and pelvic exam, combined with testing for human papilloma virus (HPV), every 5 years. Some types of HPV increase your risk of cervical cancer. Testing for HPV may also be done on women of any age with unclear Pap test results.  Other health care providers may not recommend any screening for nonpregnant women who are considered low risk for pelvic cancer and who do not have symptoms. Ask your health care provider if a screening pelvic exam is right for you.  If you have had past treatment for cervical  cancer or a condition that could lead to cancer, you need Pap tests and screening for cancer for at least 20 years after your treatment. If Pap tests have been discontinued, your risk factors (such as having a new sexual partner) need to be reassessed to determine if screening should resume. Some women have medical problems that increase the chance of getting cervical cancer. In these cases, your health care provider may recommend more frequent screening and Pap tests.  Colorectal Cancer  This type of cancer can be detected and often prevented.  Routine colorectal cancer screening usually begins at 75 years of age and continues through 75 years of age.  Your health care provider may recommend screening at an  earlier age if you have risk factors for colon cancer.  Your health care provider may also recommend using home test kits to check for hidden blood in the stool.  A small camera at the end of a tube can be used to examine your colon directly (sigmoidoscopy or colonoscopy). This is done to check for the earliest forms of colorectal cancer.  Routine screening usually begins at age 55.  Direct examination of the colon should be repeated every 5-10 years through 75 years of age. However, you may need to be screened more often if early forms of precancerous polyps or small growths are found.  Skin Cancer  Check your skin from head to toe regularly.  Tell your health care provider about any new moles or changes in moles, especially if there is a change in a mole's shape or color.  Also tell your health care provider if you have a mole that is larger than the size of a pencil eraser.  Always use sunscreen. Apply sunscreen liberally and repeatedly throughout the day.  Protect yourself by wearing long sleeves, pants, a wide-brimmed hat, and sunglasses whenever you are outside.  Heart disease, diabetes, and high blood pressure  High blood pressure causes heart disease and increases the risk of stroke. High blood pressure is more likely to develop in: ? People who have blood pressure in the high end of the normal range (130-139/85-89 mm Hg). ? People who are overweight or obese. ? People who are African American.  If you are 6-38 years of age, have your blood pressure checked every 3-5 years. If you are 28 years of age or older, have your blood pressure checked every year. You should have your blood pressure measured twice-once when you are at a hospital or clinic, and once when you are not at a hospital or clinic. Record the average of the two measurements. To check your blood pressure when you are not at a hospital or clinic, you can use: ? An automated blood pressure machine at a  pharmacy. ? A home blood pressure monitor.  If you are between 93 years and 64 years old, ask your health care provider if you should take aspirin to prevent strokes.  Have regular diabetes screenings. This involves taking a blood sample to check your fasting blood sugar level. ? If you are at a normal weight and have a low risk for diabetes, have this test once every three years after 75 years of age. ? If you are overweight and have a high risk for diabetes, consider being tested at a younger age or more often. Preventing infection Hepatitis B  If you have a higher risk for hepatitis B, you should be screened for this virus. You are considered at high risk for hepatitis B  if: ? You were born in a country where hepatitis B is common. Ask your health care provider which countries are considered high risk. ? Your parents were born in a high-risk country, and you have not been immunized against hepatitis B (hepatitis B vaccine). ? You have HIV or AIDS. ? You use needles to inject street drugs. ? You live with someone who has hepatitis B. ? You have had sex with someone who has hepatitis B. ? You get hemodialysis treatment. ? You take certain medicines for conditions, including cancer, organ transplantation, and autoimmune conditions.  Hepatitis C  Blood testing is recommended for: ? Everyone born from [birth year] through 1963. ? Anyone with known risk factors for hepatitis C.  Sexually transmitted infections (STIs)  You should be screened for sexually transmitted infections (STIs) including gonorrhea and chlamydia if: ? You are sexually active and are younger than 75 years of age. ? You are older than 75 years of age and your health care provider tells you that you are at risk for this type of infection. ? Your sexual activity has changed since you were last screened and you are at an increased risk for chlamydia or gonorrhea. Ask your health care provider if you are at risk.  If you do not  have HIV, but are at risk, it may be recommended that you take a prescription medicine daily to prevent HIV infection. This is called pre-exposure prophylaxis (PrEP). You are considered at risk if: ? You are sexually active and do not regularly use condoms or know the HIV status of your partner(s). ? You take drugs by injection. ? You are sexually active with a partner who has HIV.  Talk with your health care provider about whether you are at high risk of being infected with HIV. If you choose to begin PrEP, you should first be tested for HIV. You should then be tested every 3 months for as long as you are taking PrEP. Pregnancy  If you are premenopausal and you may become pregnant, ask your health care provider about preconception counseling.  If you may become pregnant, take 400 to 800 micrograms (mcg) of folic acid every day.  If you want to prevent pregnancy, talk to your health care provider about birth control (contraception). Osteoporosis and menopause  Osteoporosis is a disease in which the bones lose minerals and strength with aging. This can result in serious bone fractures. Your risk for osteoporosis can be identified using a bone density scan.  If you are 17 years of age or older, or if you are at risk for osteoporosis and fractures, ask your health care provider if you should be screened.  Ask your health care provider whether you should take a calcium or vitamin D supplement to lower your risk for osteoporosis.  Menopause may have certain physical symptoms and risks.  Hormone replacement therapy may reduce some of these symptoms and risks. Talk to your health care provider about whether hormone replacement therapy is right for you. Follow these instructions at home:  Schedule regular health, dental, and eye exams.  Stay current with your immunizations.  Do not use any tobacco products including cigarettes, chewing tobacco, or electronic cigarettes.  If you are pregnant,  do not drink alcohol.  If you are breastfeeding, limit how much and how often you drink alcohol.  Limit alcohol intake to no more than 1 drink per day for nonpregnant women. One drink equals 12 ounces of beer, 5 ounces of wine, or 1  ounces of hard liquor.  Do not use street drugs.  Do not share needles.  Ask your health care provider for help if you need support or information about quitting drugs.  Tell your health care provider if you often feel depressed.  Tell your health care provider if you have ever been abused or do not feel safe at home. This information is not intended to replace advice given to you by your health care provider. Make sure you discuss any questions you have with your health care provider. Document Released: 08/15/2010 Document Revised: 07/08/2015 Document Reviewed: 11/03/2014 Elsevier Interactive Patient Education  [employer].

## 2017-09-05 NOTE — Progress Notes (Signed)
Subjective:    Patient ID: [name], female    DOB: [birth year], 77 y.o.   MRN: [id number]  No chief complaint on file.   HPI Patient was seen today for ongoing concerns.  Pt was seen for AWV when she brought up several concerns.  Pt endorses history of chronic diarrhea x4-5 years.  Pt thinks her metformin is causing her symptoms.  Was taking immodium yrs ago, but it no longer helps.  Pt has not seen GI for this.  At times pt is unable to get to the restroom and has stool running down her leg.  Pt thinks she also has urinary incontinence at times.  Pt requesting Lantus pens.  Currently has vials, but has difficulty drawing up the medicine given the deformity of her L hand.  Taking 60 units nightly.  Pt using her teeth to pull back the plunger of the syringe.  Pt states fsbs typically around 100.  Last hgb A1c was 6.7%  Pt also states she is upset as medicare denied her spinal surgery yesterday.  Pt was to have surgery to remove/replace screws and plates in her cervical spine that are causing pain, numbness, and deformity of left hand.  Of note patient also has a history of RA not currently on medication.  Past Medical History:  Diagnosis Date  . Anemia    during 1st pregnancy  . Anxiety   . Arthritis   . Complication of anesthesia   . Depression   . Diarrhea, functional   . Dysrhythmia    Paroysmal Atrial Tachycardia  . Essential hypertension   . Fibromyalgia   . GERD (gastroesophageal reflux disease)   . H/O acute pancreatitis   . History of hiatal hernia   . Hyperlipemia   . Pneumonia   . PONV (postoperative nausea and vomiting)   . PSVT (paroxysmal supraventricular tachycardia) ([place])   . Reflux   . Restless leg syndrome   . Type 2 diabetes mellitus (HCC)     Allergies  Allergen Reactions  . Amoxil [Amoxicillin] Swelling and Other (See Comments)    Lips Has patient had a PCN reaction causing immediate rash, facial/tongue/throat swelling, SOB or lightheadedness with  hypotension:YES Has patient had a PCN reaction causing severe rash involving mucus membranes or skin necrosis:No Has patient had a PCN reaction that required hospitalization:No Has patient had a PCN reaction occurring within the last 10 years:Yes. If all of the above answers are "NO", then may proceed with Cephalosporin use.   . Chocolate Hives  . Demerol Nausea And Vomiting  . Lisinopril Itching and Swelling  . Meperidine Hcl     ROS General: Denies fever, chills, night sweats, changes in weight, changes in appetite HEENT: Denies headaches, ear pain, changes in vision, rhinorrhea, sore throat CV: Denies CP, palpitations, SOB, orthopnea Pulm: Denies SOB, cough, wheezing GI: Denies abdominal pain, nausea, vomiting, constipation  + diarrhea GU: Denies dysuria, hematuria, frequency, vaginal discharge Msk: Denies muscle cramps, joint pains  + deformity of left hand Neuro: Denies weakness   +numbness, tingling of left hand Skin: Denies rashes, bruising Psych: Denies depression, anxiety, hallucinations     Objective:    Blood pressure 124/78, pulse 71, weight 200 lb (90.7 kg), SpO2 98 %.   Gen. Pleasant, well-nourished, in no distress, normal affect   HEENT: [place]/AT, face symmetric, no scleral icterus, PERRLA, nares patent without drainage Lungs: no accessory muscle use Cardiovascular: RRR, no m/r/g Abdomen: BS present, soft, NT/ND Musculoskeletal: Left index finger flexed, able  to move other digits of left hand.  PIP joints of the right hand swollen 2/2 RA, no cyanosis or clubbing, normal tone Neuro:  A&Ox3, CN II-XII intact    Wt Readings from Last 3 Encounters:  09/05/17 200 lb (90.7 kg)  09/05/17 200 lb (90.7 kg)  09/04/17 200 lb 4 oz (90.8 kg)    Lab Results  Component Value Date   WBC 9.7 09/04/2017   HGB 13.4 09/04/2017   HCT 42.0 09/04/2017   PLT 213 09/04/2017   GLUCOSE 143 (H) 09/04/2017   CHOL 151 01/20/2016   TRIG 438 (A) 01/20/2016   HDL 27 (A) 01/20/2016    LDLCALC 82 01/20/2016   ALT 15 05/04/2017   AST 16 05/04/2017   NA 137 09/04/2017   K 4.3 09/04/2017   CL 108 09/04/2017   CREATININE 0.91 09/04/2017   BUN 21 09/04/2017   CO2 21 (L) 09/04/2017   TSH 0.92 05/04/2017   INR 1.00 02/16/2015   HGBA1C 6.7 (H) 09/04/2017   MICROALBUR 26.1 (H) 01/12/2017    Assessment/Plan:  Chronic diarrhea -Concern for prolonged history of diarrhea.  Consider further imaging of spine given patient's inability to control stools at times.  Patient had CT abdomen pelvis on 08/10/2017 which did note extensive spurring with spinal stenosis at L2-3 and L4-5.  Aside from cervical degenerative changes CT otherwise negative -Stat referral placed to GI. - Plan: Ambulatory referral to Gastroenterology  Controlled type 2 diabetes mellitus with diabetic neuropathy, with long-term current use of insulin ([place]) -Patient able to use insulin pen in clinic -Given sample of Lantus as patient just filled prescription for Lantus in vial. -when pt needs refill she is to call clinic so rx can be sent in for lanuts pens and pen needles. -continue lifestyle modifications.  F/u w/ pcp   More than 50% of over 25 minutes spent in total in caring for this patient was spent face-to-face with the patient, counseling and/or coordinating care.    Grier Mitts, MD

## 2017-09-06 ENCOUNTER — Encounter: Payer: Self-pay | Admitting: Gastroenterology

## 2017-09-07 DIAGNOSIS — R202 Paresthesia of skin: Secondary | ICD-10-CM | POA: Diagnosis not present

## 2017-09-07 DIAGNOSIS — R112 Nausea with vomiting, unspecified: Secondary | ICD-10-CM | POA: Diagnosis not present

## 2017-09-07 DIAGNOSIS — E119 Type 2 diabetes mellitus without complications: Secondary | ICD-10-CM | POA: Diagnosis not present

## 2017-09-07 DIAGNOSIS — R42 Dizziness and giddiness: Secondary | ICD-10-CM | POA: Diagnosis not present

## 2017-09-07 DIAGNOSIS — Z794 Long term (current) use of insulin: Secondary | ICD-10-CM | POA: Diagnosis not present

## 2017-09-07 DIAGNOSIS — Z79899 Other long term (current) drug therapy: Secondary | ICD-10-CM | POA: Diagnosis not present

## 2017-09-10 NOTE — Progress Notes (Signed)
HPI:   [name] is a 77 y.o. female, who is here today to follow on recent ER visit.   She was in the ER on 09/07/17 while she was out of town.  She was driving to [facility] when she felt "squeeze",so she drove back,stopped in a pizza place, could not get off of her car,so she "screamed" for help. + Dizziness. "Needle" sensation all over. Thought it was hypoglycemia,so she ate a chocolate chip cookies and regular soda. ? Vertigo. Evaluated initially by fireman, BP 195/100, BS 138.   EMS was called, she received IVF and BP rechecked. BP 175/80's., pulse O2 100%, HR 80/min. Having nausea,vomiting,and diarrhea.  Zofran IV was given.  Diarrhea x 20 with mucus, watery.  No blood in stool. She was transported to the ER.  Blood work done and IV Ativan,which helped with BP.  She states that she was d/c home before lab report was back.  She went back to [employer] and stayed there until Sunday.  C/o being very "tired and weak" Needle sensation resolved.  She has Hx of diarrhea,has appt pending with GI, 10/2017.  Yesterday 6 episodes of diarrhea, which is her baseline.   DM II:  She changed Lantus from vial to pen because difficulty drawing med due to left hand problems.  She is still Metformin , she did not want to discontinue medciation until surgeries are done. On Lantus 60 U daily.  Lab Results  Component Value Date   HGBA1C 6.7 (H) 09/04/2017   Hx of anxiety and depression,currently she is on Zoloft 100 mg. Denies suicidal thoughts.  She is upset because she was supposed to have neck surgery today but her insurance denied Cervical pain with LUE numbness and pain. She also has lower back pain and fibromyalgia.  She was referred to PT and pain management,considering acupuncture as well. States that she refused starting aquatic exercises.  HTN:  She is not checking BP home. She is on Metoprolol Succinate 25 mg ,Altace 10 mg daily,and  Verapamil 120 mg daily.  No chest pain,dyspnea,or visual changes.   Review of Systems  Constitutional: Positive for fatigue. Negative for activity change, appetite change and fever.  HENT: Negative for mouth sores, nosebleeds and trouble swallowing.   Eyes: Negative for redness and visual disturbance.  Respiratory: Negative for cough, shortness of breath and wheezing.   Cardiovascular: Negative for chest pain, palpitations and leg swelling.  Gastrointestinal: Positive for diarrhea. Negative for abdominal pain, blood in stool, nausea and vomiting.  Endocrine: Negative for polydipsia, polyphagia and polyuria.  Genitourinary: Negative for decreased urine volume, dysuria and hematuria.  Musculoskeletal: Positive for arthralgias, back pain and myalgias.  Skin: Negative for rash and wound.  Neurological: Positive for numbness. Negative for syncope, weakness and headaches.  Psychiatric/Behavioral: Negative for confusion. The patient is nervous/anxious.       Current Outpatient Medications on File Prior to Visit  Medication Sig Dispense Refill  . acetaminophen (TYLENOL) 325 MG tablet Take 325 mg by mouth daily as needed (for pain.).    Marland Kitchen aspirin EC 81 MG tablet Take 81 mg by mouth every evening.    . Calcium Carb-Cholecalciferol (CALCIUM 600+D3 PO) Take 1 tablet by mouth every evening.    . cholecalciferol (VITAMIN D) 1000 units tablet Take 1,000 Units by mouth daily.    . Cranberry 1000 MG CAPS Take 1 capsule by mouth 2 (two) times daily. + Vit C 40 mg    . digoxin (  LANOXIN) 0.125 MG tablet Take 0.125 mg by mouth daily.    Marland Kitchen ezetimibe (ZETIA) 10 MG tablet Take 1 tablet (10 mg total) by mouth daily. (Patient taking differently: Take 10 mg by mouth every evening. ) 90 tablet 2  . ibuprofen (ADVIL,MOTRIN) 200 MG tablet Take 200 mg by mouth daily as needed (for pain.).    Marland Kitchen insulin glargine (LANTUS) 100 UNIT/ML injection Inject 0.6 mLs (60 Units total) into the skin at bedtime. 10 mL 3  .  loperamide (IMODIUM A-D) 2 MG tablet Take 2 mg by mouth 4 (four) times daily as needed for diarrhea or loose stools.    . methocarbamol (ROBAXIN) 500 MG tablet Take 1 tablet (500 mg total) by mouth every 6 (six) hours as needed for muscle spasms. 40 tablet 0  . metoprolol succinate (TOPROL-XL) 25 MG 24 hr tablet Take 1 tablet (25 mg total) by mouth daily. 90 tablet 1  . Multiple Vitamins-Minerals (PRESERVISION AREDS 2 PO) Take 1 capsule by mouth 2 (two) times daily.    . nitroGLYCERIN (NITROSTAT) 0.4 MG SL tablet Place 0.4 mg under the tongue every 5 (five) minutes as needed. As needed chest pain    . omeprazole (PRILOSEC) 10 MG capsule Take 10 mg by mouth at bedtime.    Marland Kitchen omeprazole (PRILOSEC) 20 MG capsule Take 1 capsule (20 mg total) by mouth daily. 90 capsule 2  . ramipril (ALTACE) 10 MG capsule TAKE 1 CAPSULE BY MOUTH EVERY DAY. (Patient taking differently: TAKE 1 CAPSULE BY MOUTH EVERY DAY IN THE EVENING.) 90 capsule 0  . RELION INSULIN SYRINGE 1ML/31G 31G X 5/16" 1 ML MISC Use as directed 100 each 1  . rOPINIRole (REQUIP) 1 MG tablet Take 1 tablet (1 mg total) by mouth at bedtime. 90 tablet 1  . sertraline (ZOLOFT) 100 MG tablet Take 1 tablet (100 mg total) by mouth daily. (Patient taking differently: Take 100 mg by mouth every evening. ) 30 tablet 4  . verapamil (VERELAN PM) 240 MG 24 hr capsule Take 1 capsule (240 mg total) by mouth daily. 30 capsule 2   No current facility-administered medications on file prior to visit.      Past Medical History:  Diagnosis Date  . Anemia    during 1st pregnancy  . Anxiety   . Arthritis   . Complication of anesthesia   . Depression   . Diarrhea, functional   . Dysrhythmia    Paroysmal Atrial Tachycardia  . Essential hypertension   . Fibromyalgia   . GERD (gastroesophageal reflux disease)   . H/O acute pancreatitis   . History of hiatal hernia   . Hyperlipemia   . Pneumonia   . PONV (postoperative nausea and vomiting)   . PSVT  (paroxysmal supraventricular tachycardia) ([place])   . Reflux   . Restless leg syndrome   . Type 2 diabetes mellitus (HCC)    Allergies  Allergen Reactions  . Amoxil [Amoxicillin] Swelling and Other (See Comments)    Lips Has patient had a PCN reaction causing immediate rash, facial/tongue/throat swelling, SOB or lightheadedness with hypotension:YES Has patient had a PCN reaction causing severe rash involving mucus membranes or skin necrosis:No Has patient had a PCN reaction that required hospitalization:No Has patient had a PCN reaction occurring within the last 10 years:Yes. If all of the above answers are "NO", then may proceed with Cephalosporin use.   . Chocolate Hives  . Demerol Nausea And Vomiting  . Lisinopril Itching and Swelling  . Meperidine Hcl  Social History   Socioeconomic History  . Marital status: Widowed    Spouse name: Glendell Docker  . Number of children: 2  . Years of education: Not on file  . Highest education level: Not on file  Occupational History  . Not on file  Social Needs  . Financial resource strain: Not on file  . Food insecurity:    Worry: Not on file    Inability: Not on file  . Transportation needs:    Medical: Not on file    Non-medical: Not on file  Tobacco Use  . Smoking status: Never Smoker  . Smokeless tobacco: Never Used  Substance and Sexual Activity  . Alcohol use: No  . Drug use: No  . Sexual activity: Not on file  Lifestyle  . Physical activity:    Days per week: Not on file    Minutes per session: Not on file  . Stress: Not on file  Relationships  . Social connections:    Talks on phone: Not on file    Gets together: Not on file    Attends religious service: Not on file    Active member of club or organization: Not on file    Attends meetings of clubs or organizations: Not on file    Relationship status: Not on file  Other Topics Concern  . Not on file  Social History Narrative   Lives with husband.  Independent of ADLs.     Vitals:   09/11/17 0957  BP: 140/88  Pulse: 84  Resp: 16  Temp: 98.3 F (36.8 C)  SpO2: 98%   Body mass index is 35.78 kg/m.   Physical Exam  Nursing note and vitals reviewed. Constitutional: She is oriented to person, place, and time. She appears well-developed. No distress.  HENT:  Head: Normocephalic and atraumatic.  Mouth/Throat: Oropharynx is clear and moist and mucous membranes are normal.  Eyes: Pupils are equal, round, and reactive to light. Conjunctivae are normal.  Cardiovascular: Normal rate and regular rhythm.  No murmur heard. DP pulses present bilateral.  Respiratory: Effort normal and breath sounds normal. No respiratory distress.  GI: Soft. She exhibits no mass. There is no hepatomegaly. There is no tenderness.  Musculoskeletal: She exhibits edema (Trace pitting LE edema. She ahs compression stockings.).  Lymphadenopathy:    She has no cervical adenopathy.  Neurological: She is alert and oriented to person, place, and time. She has normal strength. Gait normal.  Skin: Skin is warm. No rash noted. No erythema.  Psychiatric: Her mood appears anxious.  Well groomed, good eye contact.    ASSESSMENT AND PLAN:  Ms. Ayo was seen today for er follow-up.  Diagnoses and all orders for this visit:  Chronic diarrhea  Acute episode resolved and back to her baseline. Metformin discontinued. Keep appt with GI. Adequate hydration.  -     Basic metabolic panel -     CBC  Controlled type 2 diabetes mellitus with diabetic neuropathy, with long-term current use of insulin ([place])  She wants to hold on sending Lantus Pen to her pharmacy because she is not sure if her insurance covers it. Names of basal insulins given to continue 60 U,so she can find out if covered by her health insurance.  Because chronic diarrhea she agrees with discontinuing Metformin. If she does not notice any improvement in 4-6 weeks we could resume it.  -     Basic metabolic  panel  Essential hypertension, benign  For now no changes in  current management. Recommend monitoring BP at home. Continue low salt diet. F/U in 3 months.  Anxiety disorder, unspecified type  I do not recommend adding benzodiazepines. For now she will continue Zoloft 100 mg. F/U in 3 months.      Marzelle Rutten G. [name], MD  [facility]. [place] office.

## 2017-09-11 ENCOUNTER — Encounter: Payer: Self-pay | Admitting: Family Medicine

## 2017-09-11 ENCOUNTER — Ambulatory Visit (INDEPENDENT_AMBULATORY_CARE_PROVIDER_SITE_OTHER): Payer: PPO | Admitting: Family Medicine

## 2017-09-11 ENCOUNTER — Ambulatory Visit (HOSPITAL_COMMUNITY): Admission: RE | Admit: 2017-09-11 | Payer: PPO | Source: Ambulatory Visit | Admitting: Neurological Surgery

## 2017-09-11 ENCOUNTER — Telehealth: Payer: Self-pay | Admitting: Family Medicine

## 2017-09-11 ENCOUNTER — Encounter (HOSPITAL_COMMUNITY): Admission: RE | Payer: Self-pay | Source: Ambulatory Visit

## 2017-09-11 VITALS — BP 140/88 | HR 84 | Temp 98.3°F | Resp 16 | Ht 63.0 in | Wt 202.0 lb

## 2017-09-11 DIAGNOSIS — I10 Essential (primary) hypertension: Secondary | ICD-10-CM | POA: Diagnosis not present

## 2017-09-11 DIAGNOSIS — K529 Noninfective gastroenteritis and colitis, unspecified: Secondary | ICD-10-CM | POA: Diagnosis not present

## 2017-09-11 DIAGNOSIS — Z794 Long term (current) use of insulin: Secondary | ICD-10-CM

## 2017-09-11 DIAGNOSIS — E1140 Type 2 diabetes mellitus with diabetic neuropathy, unspecified: Secondary | ICD-10-CM | POA: Diagnosis not present

## 2017-09-11 DIAGNOSIS — F419 Anxiety disorder, unspecified: Secondary | ICD-10-CM | POA: Diagnosis not present

## 2017-09-11 LAB — CBC
HCT: 38.3 % (ref 36.0–46.0)
Hemoglobin: 13.3 g/dL (ref 12.0–15.0)
MCHC: 34.6 g/dL (ref 30.0–36.0)
MCV: 85.1 fl (ref 78.0–100.0)
Platelets: 204 10*3/uL (ref 150.0–400.0)
RBC: 4.5 Mil/uL (ref 3.87–5.11)
RDW: 15.3 % (ref 11.5–15.5)
WBC: 8.3 10*3/uL (ref 4.0–10.5)

## 2017-09-11 LAB — BASIC METABOLIC PANEL
BUN: 14 mg/dL (ref 6–23)
CO2: 25 mEq/L (ref 19–32)
Calcium: 9.8 mg/dL (ref 8.4–10.5)
Chloride: 105 mEq/L (ref 96–112)
Creatinine, Ser: 0.87 mg/dL (ref 0.40–1.20)
GFR: 67.45 mL/min (ref >60.00)
Glucose, Bld: 98 mg/dL (ref 70–99)
Potassium: 4.5 mEq/L (ref 3.5–5.1)
Sodium: 138 mEq/L (ref 135–145)

## 2017-09-11 SURGERY — ANTERIOR CERVICAL DECOMPRESSION/DISCECTOMY FUSION 1 LEVEL/HARDWARE REMOVAL
Anesthesia: General

## 2017-09-11 NOTE — Telephone Encounter (Signed)
Message sent to Dr. Jordan for review. 

## 2017-09-11 NOTE — Patient Instructions (Addendum)
A few things to remember from today's visit:   Controlled type 2 diabetes mellitus with diabetic neuropathy, with long-term current use of insulin (HCC)  Insulin options: Basaglar,Toujeo. Stop Metformin.  Keep appt with GI.  Adequate hydration. Continue following with neurosurgeon.   Please be sure medication list is accurate. If a new problem present, please set up appointment sooner than planned today.

## 2017-09-11 NOTE — Telephone Encounter (Signed)
Copied from [id number]. Topic: Quick Communication - See Telephone Encounter >> Sep 11, 2017  4:06 PM Conception Chancy, NT wrote: CRM for notification. See Telephone encounter for: 09/11/17.   Patient is calling and states that she was supposed to give Dr. [name] the information about 2 different insulin prices. She states they both cost over $300 and that is without insurance because they cant run it with insurance without a prescription. They also stated they would not be able to get Toujeo. Please advise.

## 2017-09-12 DIAGNOSIS — M5412 Radiculopathy, cervical region: Secondary | ICD-10-CM | POA: Diagnosis not present

## 2017-09-12 DIAGNOSIS — I10 Essential (primary) hypertension: Secondary | ICD-10-CM | POA: Diagnosis not present

## 2017-09-12 DIAGNOSIS — Z6834 Body mass index (BMI) 34.0-34.9, adult: Secondary | ICD-10-CM | POA: Diagnosis not present

## 2017-09-12 DIAGNOSIS — M542 Cervicalgia: Secondary | ICD-10-CM | POA: Diagnosis not present

## 2017-09-12 LAB — TYPE AND SCREEN
ABO/RH(D): A NEG
Antibody Screen: POSITIVE
DAT, IgG: NEGATIVE
Unit division: 0
Unit division: 0

## 2017-09-12 LAB — BPAM RBC
Blood Product Expiration Date: 201908112359
Blood Product Expiration Date: 201908112359
Unit Type and Rh: 600
Unit Type and Rh: 600

## 2017-09-15 ENCOUNTER — Encounter: Payer: Self-pay | Admitting: Family Medicine

## 2017-09-17 DIAGNOSIS — H35363 Drusen (degenerative) of macula, bilateral: Secondary | ICD-10-CM | POA: Diagnosis not present

## 2017-09-17 LAB — HM DIABETES EYE EXAM

## 2017-09-17 NOTE — Progress Notes (Signed)
Medical screening examination/treatment was performed by qualified clinical staff member and as supervising physician I was immediately available for consultation/collaboration. I have reviewed documentation and agree with assessment and plan.  Demetreus Lothamer R Daemien Fronczak, MD  

## 2017-09-18 ENCOUNTER — Other Ambulatory Visit: Payer: Self-pay | Admitting: Family Medicine

## 2017-09-18 MED ORDER — BASAGLAR KWIKPEN 100 UNIT/ML ~~LOC~~ SOPN: 60.0000 [IU] | PEN_INJECTOR | Freq: Every day | SUBCUTANEOUS | 1 refills | Status: DC

## 2017-09-18 NOTE — Telephone Encounter (Signed)
Rx for Basaglar sent to her pharmacy to replace Lantus,same dose and frequency. Thanks, BJ

## 2017-09-18 NOTE — Progress Notes (Signed)
asa

## 2017-09-18 NOTE — Telephone Encounter (Signed)
Left detailed message informing patient that Rx was sent to pharmacy.

## 2017-09-19 ENCOUNTER — Ambulatory Visit: Payer: PPO | Attending: Neurosurgery | Admitting: Physical Therapy

## 2017-09-19 ENCOUNTER — Encounter: Payer: Self-pay | Admitting: Physical Therapy

## 2017-09-19 DIAGNOSIS — M5412 Radiculopathy, cervical region: Secondary | ICD-10-CM | POA: Insufficient documentation

## 2017-09-19 DIAGNOSIS — M542 Cervicalgia: Secondary | ICD-10-CM | POA: Insufficient documentation

## 2017-09-19 DIAGNOSIS — M6281 Muscle weakness (generalized): Secondary | ICD-10-CM | POA: Diagnosis not present

## 2017-09-19 NOTE — Therapy (Signed)
[facility] [place], [place], [address] Phone: [phone]   Fax:  [phone]  Physical Therapy Evaluation  Patient Details  Name: [name] MRN: [id number] Date of Birth: 02-17-1940 Referring Provider: Simeon Craft, PA-C   Encounter Date: 09/19/2017  PT End of Session - 09/19/17 1838    Visit Number  1    Number of Visits  6    Date for PT Re-Evaluation  11/14/17    PT Start Time  4332    PT Stop Time  1430    PT Time Calculation (min)  42 min    Activity Tolerance  Patient limited by pain    Behavior During Therapy  [facility] for tasks assessed/performed       Past Medical History:  Diagnosis Date  . Anemia    during 1st pregnancy  . Anxiety   . Arthritis   . Complication of anesthesia   . Depression   . Diarrhea, functional   . Dysrhythmia    Paroysmal Atrial Tachycardia  . Essential hypertension   . Fibromyalgia   . GERD (gastroesophageal reflux disease)   . H/O acute pancreatitis   . History of hiatal hernia   . Hyperlipemia   . Pneumonia   . PONV (postoperative nausea and vomiting)   . PSVT (paroxysmal supraventricular tachycardia) ([place])   . Reflux   . Restless leg syndrome   . Type 2 diabetes mellitus ([place])     Past Surgical History:  Procedure Laterality Date  . ABDOMINAL HYSTERECTOMY    . ANKLE SURGERY    . BACK SURGERY     T12-L1 fusion, Anterior Cervical fusion  . CARDIAC CATHETERIZATION N/A 02/16/2015   Procedure: Left Heart Cath and Coronary Angiography;  Surgeon: Sherren Mocha, MD;  Location: [place] [facility];  Service: Cardiovascular;  Laterality: N/A;  . CHOLECYSTECTOMY    . COLONOSCOPY    . ESOPHAGOGASTRODUODENOSCOPY  12/21/2010   Procedure: ESOPHAGOGASTRODUODENOSCOPY (EGD);  Surgeon: Landry Dyke, MD;  Location: [facility];  Service: Endoscopy;  Laterality: N/A;  . EXCISION MORTON'S NEUROMA    . FRACTURE SURGERY    . KNEE SURGERY     x 3  . NECK SURGERY    . ROTATOR CUFF  REPAIR    . TONSILLECTOMY    . TOTAL KNEE ARTHROPLASTY Left 04/10/2017   Procedure: LEFT TOTAL KNEE ARTHROPLASTY;  Surgeon: Paralee Cancel, MD;  Location: [facility];  Service: Orthopedics;  Laterality: Left;  70 mins  . UPPER GASTROINTESTINAL ENDOSCOPY    . WRIST SURGERY      There were no vitals filed for this visit.   Subjective Assessment - 09/19/17 1826    Subjective  Patient arrives to physical therapy with reports of constant neck pain that radiates to fingers. Patient reports symptoms began late May 2019. Patient reported she woke up with 10/10 left sided neck pain that radiated to fingers and caused cramping in index finger. Patient continues to have a cramped index finger that limits functional fine motor tasks. Patient reports decreased strength in L hand as well as constant numbness in 4th and 5th fingers. Patient reports pain throughout L side of neck down arm but most of her pain is localized to the mid left forearm to wrist. Patient reports pain at worst is 5/10 with increased activity. Patient's pain at best is 9/51 with rest and certain positionings.   Patient's goals are to decrease pain, improve function and to return to recreational hobby of sewing.  Pertinent History  C4-C6 spinal fusion, L TKA, right RTC, left wrist and left ankle surgery. Athritis and DM, HTN, Fibromyalgia    Limitations  House hold activities;Lifting gripping and fine motor tasks    Diagnostic tests  MRI and Nerve Conduction Study    Patient Stated Goals  decrease pain, improve use of L hand and sew again.    Currently in Pain?  Yes    Pain Score  5     Pain Location  Arm    Pain Orientation  Left    Pain Descriptors / Indicators  Tingling;Numbness;Aching;Radiating    Pain Type  Chronic pain    Pain Radiating Towards  left wrist    Pain Onset  More than a month ago    Pain Frequency  Constant    Aggravating Factors   increased activity, movement    Pain Relieving Factors  rest and certain positionings     Effect of Pain on Daily Activities  fine motor task difficult         [facility] PT Assessment - 09/19/17 0001      Assessment   Medical Diagnosis  Cervicalgia    Referring Provider  Simeon Craft, PA-C    Onset Date/Surgical Date  -- ongoing, May 2019    Hand Dominance  Right    Next MD Visit  August 26th    Prior Therapy  Yes, for knees      Precautions   Precautions  None      Balance Screen   Has the patient fallen in the past 6 months  Yes    How many times?  4 or 5    Has the patient had a decrease in activity level because of a fear of falling?   Yes    Is the patient reluctant to leave their home because of a fear of falling?   Yes      [place] residence      Prior Function   Level of Independence  Independent increased time to perform tasks required      Sensation   Light Touch  Impaired by gross assessment decreased sensation in C6, C7, and T1 dermatome      Posture/Postural Control   Posture/Postural Control  Postural limitations    Postural Limitations  Rounded Shoulders;Forward head;Increased thoracic kyphosis      ROM / Strength   AROM / PROM / Strength  AROM;Strength      AROM   AROM Assessment Site  Cervical    Cervical Flexion  35    Cervical Extension  31    Cervical - Right Side Bend  13    Cervical - Left Side Bend  10    Cervical - Right Rotation  33    Cervical - Left Rotation  30      Strength   Overall Strength  Deficits    Overall Strength Comments  Grip strength: Right: 40 lbs of force, Left: 0 lbs of force (unable to detect grip strength)    Strength Assessment Site  Shoulder    Right/Left Shoulder  Left    Left Shoulder Flexion  4/5    Left Shoulder ABduction  4/5    Left Shoulder Internal Rotation  4+/5    Left Shoulder External Rotation  4+/5      Palpation   Palpation comment  tenderness to palpation along L cervical paraspinals, L UT, and upper thoracic paraspinals  Special Tests     Special Tests  Thoracic Outlet Syndrome    Other special tests  (+) Froment's sign    Thoracic Outlet Syndrome   Adson Test      Adson Test   Findings  Negative    Side   Left                Objective measurements completed on examination: See above findings.              PT Education - 09/19/17 1837    Education Details  Chin tucks, putty squeezes, shoulder rolls, scapular retractions, towel squeezes    Person(s) Educated  Patient    Methods  Explanation;Demonstration;Handout    Comprehension  Verbalized understanding          PT Long Term Goals - 09/19/17 1840      PT LONG TERM GOAL #1   Title  Patient will be independent with HEP    Time  6    Period  Weeks    Status  New      PT LONG TERM GOAL #2   Title  Patient will report centralization of numbness and tingling symptoms to L elbow to indicate reduced nerve irritation.    Time  6    Period  Weeks    Status  New      PT LONG TERM GOAL #3   Title  Patient will demonstrate 8+ lbs of left grip strength to improve fine motor tasks.    Time  6    Period  Weeks    Status  New      PT LONG TERM GOAL #4   Title  Patient will demonstrate 50+ degrees of active cervical rotation ROM to scan environment.    Time  6    Period  Weeks    Status  New             Plan - 09/19/17 1839    Clinical Impression Statement  Patient is a 77 year old female who presents to physical therapy with left sided cervical pain, decreased cervical AROM, and decreased left grip strength. Patient's left grip strength was undetectable through the hand held dynamometer. Patient also noted with decrease left shoulder AROM. Patient demonstrated decreased left triceps DTR and normal biceps and brachioradialis DTR. Patient (+) for numbness in left 4th and 5th fingers as well as decreased sensation along C6, C7 and T1 dermatomes in comparison to right. Patient noted with forward head, rounded shoulders, and increased thoracic  kyphosis. Patient tender to palpation along upper left cervical paraspinals, left upper trapezius, and upper thoracic spine. Patient (-) for Adson's test for thoracic outlet syndrome and (+) Froment's sign which may indicate ulnar nerve palsy. Patient may benefit from skilled physical therapy to address deficits and address patient's goals    Clinical Presentation  Evolving    Clinical Presentation due to:  not improving, worsening of symptoms    Clinical Decision Making  Moderate    Rehab Potential  Fair    PT Frequency  1x / week    PT Duration  6 weeks    PT Treatment/Interventions  ADLs/Self Care Home Management;Cryotherapy;Electrical Stimulation;Gait training;Stair training;Functional mobility training;Therapeutic activities;Therapeutic exercise;Neuromuscular re-education;Patient/family education;Passive range of motion;Manual techniques    PT Next Visit Plan  chin tucks, cervical AROM, postural exercises, STW/M, modalities for pain relief.    Consulted and Agree with Plan of Care  Patient       Patient  will benefit from skilled therapeutic intervention in order to improve the following deficits and impairments:  Pain, Impaired UE functional use, Decreased strength, Decreased range of motion, Postural dysfunction  Visit Diagnosis: Cervicalgia  Radiculopathy, cervical region  Muscle weakness (generalized)     Problem List Patient Active Problem List   Diagnosis Date Noted  . Chronic diarrhea 08/08/2017  . S/P left TKA 04/10/2017  . Unstable gait 01/12/2017  . Generalized osteoarthritis of hand 01/12/2017  . RLS (restless legs syndrome) 11/09/2016  . GERD (gastroesophageal reflux disease) 07/13/2016  . Class 2 severe obesity due to excess calories with serious comorbidity and body mass index (BMI) of 36.0 to 36.9 in adult ([place]) 06/01/2016  . Vitamin D deficiency 06/01/2016  . Vitamin B12 deficiency 03/27/2016  . Chronic cough 03/16/2016  . Left foot pain 03/16/2016  . Demand  ischemia ([place]) 02/14/2015  . Degenerative drusen 07/13/2013  . Type 2 diabetes mellitus without ophthalmic manifestations ([place]) 07/13/2013  . DM (diabetes mellitus) type II controlled, neurological manifestation ([place]) 05/07/2012  . Paroxysmal atrial tachycardia ([place]) 05/07/2012  . Essential hypertension, benign 05/07/2012  . Depression 05/07/2012  . Hyperlipidemia 05/07/2012  . Fibromyalgia 05/07/2012   Gabriela Eves, PT, DPT 09/19/2017, 6:49 PM  [facility] [address] [place], [place], [address] Phone: [phone]   Fax:  [phone]  Name: [name] MRN: [id number] Date of Birth: 03/15/1940

## 2017-09-20 ENCOUNTER — Emergency Department (HOSPITAL_COMMUNITY)
Admission: EM | Admit: 2017-09-20 | Discharge: 2017-09-21 | Disposition: A | Discharge status: Home / Self Care | Payer: PPO | Attending: Emergency Medicine | Admitting: Emergency Medicine

## 2017-09-20 ENCOUNTER — Encounter (HOSPITAL_COMMUNITY): Payer: Self-pay

## 2017-09-20 ENCOUNTER — Other Ambulatory Visit: Payer: Self-pay

## 2017-09-20 DIAGNOSIS — I10 Essential (primary) hypertension: Secondary | ICD-10-CM | POA: Insufficient documentation

## 2017-09-20 DIAGNOSIS — Z96652 Presence of left artificial knee joint: Secondary | ICD-10-CM | POA: Insufficient documentation

## 2017-09-20 DIAGNOSIS — Z79899 Other long term (current) drug therapy: Secondary | ICD-10-CM | POA: Diagnosis not present

## 2017-09-20 DIAGNOSIS — Z7982 Long term (current) use of aspirin: Secondary | ICD-10-CM | POA: Insufficient documentation

## 2017-09-20 DIAGNOSIS — R200 Anesthesia of skin: Secondary | ICD-10-CM | POA: Insufficient documentation

## 2017-09-20 DIAGNOSIS — R531 Weakness: Secondary | ICD-10-CM

## 2017-09-20 DIAGNOSIS — R51 Headache: Secondary | ICD-10-CM | POA: Diagnosis not present

## 2017-09-20 DIAGNOSIS — Z7984 Long term (current) use of oral hypoglycemic drugs: Secondary | ICD-10-CM | POA: Insufficient documentation

## 2017-09-20 DIAGNOSIS — R519 Headache, unspecified: Secondary | ICD-10-CM

## 2017-09-20 DIAGNOSIS — G4489 Other headache syndrome: Secondary | ICD-10-CM | POA: Diagnosis not present

## 2017-09-20 DIAGNOSIS — E119 Type 2 diabetes mellitus without complications: Secondary | ICD-10-CM | POA: Insufficient documentation

## 2017-09-20 DIAGNOSIS — R202 Paresthesia of skin: Secondary | ICD-10-CM | POA: Diagnosis not present

## 2017-09-20 NOTE — ED Notes (Signed)
Patient;s family updated on wait times

## 2017-09-20 NOTE — ED Triage Notes (Addendum)
Per [facility], HA and numbness/tingling all over her body since this morning. Seen at a hospital recently for same, given ativan and "felt better" Appears anxious. VS 160/80, HR 70, 98% on RA, RR 20. Stroke scale negative. Pt describes "room spinning" sensation. Was told at recent ER visit she may have vertigo.

## 2017-09-21 ENCOUNTER — Telehealth: Payer: Self-pay | Admitting: *Deleted

## 2017-09-21 ENCOUNTER — Emergency Department (HOSPITAL_COMMUNITY): Payer: PPO

## 2017-09-21 DIAGNOSIS — R51 Headache: Secondary | ICD-10-CM | POA: Diagnosis not present

## 2017-09-21 LAB — BASIC METABOLIC PANEL
Anion gap: 14 (ref 5–15)
BUN: 27 mg/dL — ABNORMAL HIGH (ref 8–23)
CO2: 20 mmol/L — ABNORMAL LOW (ref 22–32)
Calcium: 9.4 mg/dL (ref 8.9–10.3)
Chloride: 104 mmol/L (ref 98–111)
Creatinine, Ser: 1.08 mg/dL — ABNORMAL HIGH (ref 0.44–1.00)
GFR calc Af Amer: 57 mL/min — ABNORMAL LOW (ref >60)
GFR calc non Af Amer: 49 mL/min — ABNORMAL LOW (ref >60)
Glucose, Bld: 197 mg/dL — ABNORMAL HIGH (ref 70–99)
Potassium: 4.1 mmol/L (ref 3.5–5.1)
Sodium: 138 mmol/L (ref 135–145)

## 2017-09-21 LAB — CBC
HCT: 41.4 % (ref 36.0–46.0)
Hemoglobin: 13.7 g/dL (ref 12.0–15.0)
MCH: 29 pg (ref 26.0–34.0)
MCHC: 33.1 g/dL (ref 30.0–36.0)
MCV: 87.7 fL (ref 78.0–100.0)
Platelets: 196 10*3/uL (ref 150–400)
RBC: 4.72 MIL/uL (ref 3.87–5.11)
RDW: 14.4 % (ref 11.5–15.5)
WBC: 8.2 10*3/uL (ref 4.0–10.5)

## 2017-09-21 LAB — TROPONIN I: Troponin I: <0.03 ng/mL (ref <0.03)

## 2017-09-21 NOTE — Telephone Encounter (Signed)
Prior auth for [employer] sent to [email or web address]-key [id number].

## 2017-09-21 NOTE — ED Provider Notes (Signed)
[facility] EMERGENCY DEPARTMENT Provider Note   CSN: [id number] Arrival date & time: 09/20/17  1826     History   Chief Complaint Chief Complaint  Patient presents with  . Headache  . Numbness    HPI [name] is a 77 y.o. female.  HPI Patient is a 77 year old female who presents to the emergency department with headache and numbness and tingling as well as generalized weakness all over her body.  She has similar episode a week and a half ago and was seen in outside hospital and was given Ativan and had improvement in her symptoms.  She denies unilateral arm or leg weakness.  No slurred speech.  At times she has had episodes of a sensation of things moving in the room spinning.  Symptoms are mild to moderate in severity.  Seem to be improving since arrival to the emergency department.   Past Medical History:  Diagnosis Date  . Anemia    during 1st pregnancy  . Anxiety   . Arthritis   . Complication of anesthesia   . Depression   . Diarrhea, functional   . Dysrhythmia    Paroysmal Atrial Tachycardia  . Essential hypertension   . Fibromyalgia   . GERD (gastroesophageal reflux disease)   . H/O acute pancreatitis   . History of hiatal hernia   . Hyperlipemia   . Pneumonia   . PONV (postoperative nausea and vomiting)   . PSVT (paroxysmal supraventricular tachycardia) ([place])   . Reflux   . Restless leg syndrome   . Type 2 diabetes mellitus [facility])     Patient Active Problem List   Diagnosis Date Noted  . Chronic diarrhea 08/08/2017  . S/P left TKA 04/10/2017  . Unstable gait 01/12/2017  . Generalized osteoarthritis of hand 01/12/2017  . RLS (restless legs syndrome) 11/09/2016  . GERD (gastroesophageal reflux disease) 07/13/2016  . Class 2 severe obesity due to excess calories with serious comorbidity and body mass index (BMI) of 36.0 to 36.9 in adult ([place]) 06/01/2016  . Vitamin D deficiency 06/01/2016  . Vitamin B12 deficiency 03/27/2016  .  Chronic cough 03/16/2016  . Left foot pain 03/16/2016  . Demand ischemia ([place]) 02/14/2015  . Degenerative drusen 07/13/2013  . Type 2 diabetes mellitus without ophthalmic manifestations ([place]) 07/13/2013  . DM (diabetes mellitus) type II controlled, neurological manifestation ([place]) 05/07/2012  . Paroxysmal atrial tachycardia ([place]) 05/07/2012  . Essential hypertension, benign 05/07/2012  . Depression 05/07/2012  . Hyperlipidemia 05/07/2012  . Fibromyalgia 05/07/2012    Past Surgical History:  Procedure Laterality Date  . ABDOMINAL HYSTERECTOMY    . ANKLE SURGERY    . BACK SURGERY     T12-L1 fusion, Anterior Cervical fusion  . CARDIAC CATHETERIZATION N/A 02/16/2015   Procedure: Left Heart Cath and Coronary Angiography;  Surgeon: Sherren Mocha, MD;  Location: [place] [facility];  Service: Cardiovascular;  Laterality: N/A;  . CHOLECYSTECTOMY    . COLONOSCOPY    . ESOPHAGOGASTRODUODENOSCOPY  12/21/2010   Procedure: ESOPHAGOGASTRODUODENOSCOPY (EGD);  Surgeon: Landry Dyke, MD;  Location: [facility];  Service: Endoscopy;  Laterality: N/A;  . EXCISION MORTON'S NEUROMA    . FRACTURE SURGERY    . KNEE SURGERY     x 3  . NECK SURGERY    . ROTATOR CUFF REPAIR    . TONSILLECTOMY    . TOTAL KNEE ARTHROPLASTY Left 04/10/2017   Procedure: LEFT TOTAL KNEE ARTHROPLASTY;  Surgeon: Paralee Cancel, MD;  Location: [facility];  Service: Orthopedics;  Laterality: Left;  70 mins  . UPPER GASTROINTESTINAL ENDOSCOPY    . WRIST SURGERY       OB History   None      Home Medications    Prior to Admission medications   Medication Sig Start Date End Date Taking? Authorizing Provider  acetaminophen (TYLENOL) 325 MG tablet Take 325 mg by mouth daily as needed (for pain.).   Yes [provider]  aspirin EC 81 MG tablet Take 81 mg by mouth every evening.   Yes [provider]  Calcium Carb-Cholecalciferol (CALCIUM 600+D3 PO) Take 1 tablet by mouth every evening.   Yes [provider]  cholecalciferol (VITAMIN D) 1000 units tablet Take 1,000 Units by mouth daily.   Yes [provider]  Cranberry 1000 MG CAPS Take 1 capsule by mouth 2 (two) times daily. + Vit C 40 mg   Yes [provider]  digoxin (LANOXIN) 0.125 MG tablet Take 0.125 mg by mouth daily.   Yes [provider]  ezetimibe (ZETIA) 10 MG tablet Take 1 tablet (10 mg total) by mouth daily. Patient taking differently: Take 10 mg by mouth every evening.  11/10/16  Yes [name], [name], MD  ibuprofen (ADVIL,MOTRIN) 200 MG tablet Take 200 mg by mouth daily as needed (for pain.).   Yes [provider]  Insulin Glargine (BASAGLAR KWIKPEN) 100 UNIT/ML SOPN Inject 0.6 mLs (60 Units total) into the skin daily. Patient taking differently: Inject 60 Units into the skin at bedtime.  09/18/17  Yes [name], [name], MD  loperamide (IMODIUM A-D) 2 MG tablet Take 2 mg by mouth 4 (four) times daily as needed for diarrhea or loose stools.   Yes [provider]  metFORMIN (GLUCOPHAGE) 1000 MG tablet Take 1,000 mg by mouth 2 (two) times daily with a meal.   Yes [provider]  metoprolol succinate (TOPROL-XL) 25 MG 24 hr tablet Take 1 tablet (25 mg total) by mouth daily. 05/02/17  Yes [name], [name], MD  Multiple Vitamins-Minerals (PRESERVISION AREDS 2 PO) Take 1 capsule by mouth 2 (two) times daily.   Yes [provider]  nitroGLYCERIN (NITROSTAT) 0.4 MG SL tablet Place 0.4 mg under the tongue every 5 (five) minutes as needed. As needed chest pain   Yes [provider]  omeprazole (PRILOSEC) 20 MG capsule Take 1 capsule (20 mg total) by mouth daily. 05/01/17  Yes [name], [name], MD  ramipril (ALTACE) 10 MG capsule TAKE 1 CAPSULE BY MOUTH EVERY DAY. Patient taking differently: Take 10 mg by mouth every evening.  08/13/17  Yes [name], [name], MD  rOPINIRole (REQUIP) 1 MG tablet Take 1 tablet (1 mg total) by mouth at bedtime. 06/22/17  Yes [name], [name], MD    sertraline (ZOLOFT) 100 MG tablet Take 1 tablet (100 mg total) by mouth daily. Patient taking differently: Take 100 mg by mouth every evening.  05/02/17  Yes [name], [name], MD  verapamil (CALAN) 120 MG tablet Take 120 mg by mouth as needed (for heart).   Yes [provider]  verapamil (VERELAN PM) 240 MG 24 hr capsule Take 1 capsule (240 mg total) by mouth daily. 05/17/16  Yes [place]et, Sindy [place], MD  methocarbamol (ROBAXIN) 500 MG tablet Take 1 tablet (500 mg total) by mouth every 6 (six) hours as needed for muscle spasms. Patient not taking: Reported on 09/21/2017 04/10/17   Danae Orleans, PA-C  RELION INSULIN SYRINGE 1ML/31G 31G X 5/16" 1  ML MISC Use as directed 01/30/17   [name], [name], MD    Family History Family History  Problem Relation Age of Onset  . Heart failure Mother   . Hyperlipidemia Mother   . Lung cancer Father        Lymphoma  . Hyperlipidemia Maternal Grandmother   . Peripheral vascular disease Maternal Grandmother     Social History Social History   Tobacco Use  . Smoking status: Never Smoker  . Smokeless tobacco: Never Used  Substance Use Topics  . Alcohol use: No  . Drug use: No     Allergies   Amoxil [amoxicillin]; Chocolate; Demerol; Lisinopril; and Meperidine hcl   Review of Systems Review of Systems  All other systems reviewed and are negative.    Physical Exam Updated Vital Signs BP (!) 161/85   Pulse 72   Temp 98.1 F (36.7 C) (Oral)   Resp 18   Ht 5\' 3"  (1.6 m)   Wt 90.7 kg   SpO2 100%   BMI 35.43 kg/m   Physical Exam  Constitutional: She is oriented to person, place, and time. She appears well-developed and well-nourished. No distress.  HENT:  Head: Normocephalic and atraumatic.  Eyes: Pupils are equal, round, and reactive to light. EOM are normal.  Neck: Normal range of motion.  Cardiovascular: Normal rate, regular rhythm and normal heart sounds.  Pulmonary/Chest: Effort normal and breath sounds normal.  Abdominal:  Soft. She exhibits no distension. There is no tenderness.  Musculoskeletal: Normal range of motion.  Neurological: She is alert and oriented to person, place, and time.  5/5 strength in major muscle groups of  bilateral upper and lower extremities. Speech normal. No facial asymetry.   Skin: Skin is warm and dry.  Psychiatric: She has a normal mood and affect. Judgment normal.  Nursing note and vitals reviewed.    ED Treatments / Results  Labs (all labs ordered are listed, but only abnormal results are displayed) Labs Reviewed  BASIC METABOLIC PANEL - Abnormal; Notable for the following components:      Result Value   CO2 20 (*)    Glucose, Bld 197 (*)    BUN 27 (*)    Creatinine, Ser 1.08 (*)    GFR calc non Af Amer 49 (*)    GFR calc Af Amer 57 (*)    All other components within normal limits  CBC  TROPONIN I  URINALYSIS, ROUTINE W REFLEX MICROSCOPIC    EKG None  Radiology Ct Head Wo Contrast  Result Date: 09/21/2017 CLINICAL DATA:  Acute onset of headache and generalized numbness and tingling. EXAM: CT HEAD WITHOUT CONTRAST TECHNIQUE: Contiguous axial images were obtained from the base of the skull through the vertex without intravenous contrast. COMPARISON:  CT of the head performed 05/30/2004 FINDINGS: Brain: No evidence of acute infarction, hemorrhage, hydrocephalus, extra-axial collection or mass lesion / mass effect. Prominence of the ventricles and sulci reflects mild cortical volume loss. Mild cerebellar atrophy is noted. The brainstem and fourth ventricle are within normal limits. The basal ganglia are unremarkable in appearance. The cerebral hemispheres demonstrate grossly normal gray-white differentiation. No mass effect or midline shift is seen. Vascular: No hyperdense vessel or unexpected calcification. Skull: There is no evidence of fracture; visualized osseous structures are unremarkable in appearance. Sinuses/Orbits: The orbits are within normal limits. A mucus  retention cyst or polyp is noted at the right maxillary sinus. The remaining paranasal sinuses and mastoid air cells are well-aerated. Other: No significant  soft tissue abnormalities are seen. IMPRESSION: 1. No acute intracranial pathology seen on CT. 2. Mild cortical volume loss noted. 3. Mucus retention cyst or polyp at the right maxillary sinus. Electronically Signed   By: Garald Balding M.D.   On: 09/21/2017 02:24    Procedures Procedures (including critical care time)  Medications Ordered in ED Medications - No data to display   Initial Impression / Assessment and Plan / ED Course  I have reviewed the triage vital signs and the nursing notes.  Pertinent labs & imaging results that were available during my care of the patient were reviewed by me and considered in my medical decision making (see chart for details).     Patient has had 2 episodes like this in the past 2 weeks.  They both seem secondary to anxiety and stress.  These may represent panic attacks.  She could have a component of vertigo that then leads her to somewhat panic but seems like these are more classic for anxiety attacks.  Close primary care follow-up.  Work-up in the emergency department without abnormality.  Patient's daughter is a Marine scientist and this is her assessment as well based on her evaluation of her mother.  Final Clinical Impressions(s) / ED Diagnoses   Final diagnoses:  Acute nonintractable headache, unspecified headache type  General weakness    ED Discharge Orders    None       Jola Schmidt, MD 09/21/17 [phone]

## 2017-09-21 NOTE — ED Notes (Signed)
Pt states she is feeling better, back to baseline.

## 2017-09-24 ENCOUNTER — Encounter: Payer: Self-pay | Admitting: Family Medicine

## 2017-09-25 ENCOUNTER — Ambulatory Visit: Payer: PPO | Admitting: *Deleted

## 2017-09-25 DIAGNOSIS — M542 Cervicalgia: Secondary | ICD-10-CM

## 2017-09-25 DIAGNOSIS — M6281 Muscle weakness (generalized): Secondary | ICD-10-CM

## 2017-09-25 DIAGNOSIS — M5412 Radiculopathy, cervical region: Secondary | ICD-10-CM

## 2017-09-25 NOTE — Therapy (Signed)
[facility] [place], [place], [address] Phone: [phone]   Fax:  [phone]  Physical Therapy Treatment  Patient Details  Name: [name] MRN: [id number] Date of Birth: 03/16/1940 Referring Provider: Simeon Craft, PA-C   Encounter Date: 09/25/2017  PT End of Session - 09/25/17 1649    Visit Number  2    Number of Visits  6    Date for PT Re-Evaluation  11/14/17    PT Start Time  6720    PT Stop Time  1605    PT Time Calculation (min)  50 min       Past Medical History:  Diagnosis Date  . Anemia    during 1st pregnancy  . Anxiety   . Arthritis   . Complication of anesthesia   . Depression   . Diarrhea, functional   . Dysrhythmia    Paroysmal Atrial Tachycardia  . Essential hypertension   . Fibromyalgia   . GERD (gastroesophageal reflux disease)   . H/O acute pancreatitis   . History of hiatal hernia   . Hyperlipemia   . Pneumonia   . PONV (postoperative nausea and vomiting)   . PSVT (paroxysmal supraventricular tachycardia) ([place])   . Reflux   . Restless leg syndrome   . Type 2 diabetes mellitus ([place])     Past Surgical History:  Procedure Laterality Date  . ABDOMINAL HYSTERECTOMY    . ANKLE SURGERY    . BACK SURGERY     T12-L1 fusion, Anterior Cervical fusion  . CARDIAC CATHETERIZATION N/A 02/16/2015   Procedure: Left Heart Cath and Coronary Angiography;  Surgeon: Sherren Mocha, MD;  Location: [facility];  Service: Cardiovascular;  Laterality: N/A;  . CHOLECYSTECTOMY    . COLONOSCOPY    . ESOPHAGOGASTRODUODENOSCOPY  12/21/2010   Procedure: ESOPHAGOGASTRODUODENOSCOPY (EGD);  Surgeon: Landry Dyke, MD;  Location: [facility];  Service: Endoscopy;  Laterality: N/A;  . EXCISION MORTON'S NEUROMA    . FRACTURE SURGERY    . KNEE SURGERY     x 3  . NECK SURGERY    . ROTATOR CUFF REPAIR    . TONSILLECTOMY    . TOTAL KNEE ARTHROPLASTY Left 04/10/2017   Procedure: LEFT TOTAL KNEE  ARTHROPLASTY;  Surgeon: Paralee Cancel, MD;  Location: [facility];  Service: Orthopedics;  Laterality: Left;  70 mins  . UPPER GASTROINTESTINAL ENDOSCOPY    . WRIST SURGERY      There were no vitals filed for this visit.  Subjective Assessment - 09/25/17 1515    Pertinent History  C4-C6 spinal fusion, L TKA, right RTC, left wrist and left ankle surgery. Athritis and DM, HTN, Fibromyalgia    Limitations  House hold activities;Lifting    Diagnostic tests  MRI and Nerve Conduction Study    Patient Stated Goals  decrease pain, improve use of L hand and sew again.    Currently in Pain?  Yes    Pain Score  2     Pain Location  Head    Pain Orientation  Right;Left    Pain Descriptors / Indicators  Aching    Pain Type  Acute pain                       [facility] Adult PT Treatment/Exercise - 09/25/17 0001      Exercises   Exercises  Neck      Neck Exercises: Seated   Neck Retraction  10 reps  Cervical Rotation  Right;Left;10 reps    Other Seated Exercise  scapular retractions x10      Manual Therapy   Manual Therapy  Soft tissue mobilization    Soft tissue mobilization   STW/ TPR to LT Utrap, levator, and cervical paras in sitting                  PT Long Term Goals - 09/19/17 1840      PT LONG TERM GOAL #1   Title  Patient will be independent with HEP    Time  6    Period  Weeks    Status  New      PT LONG TERM GOAL #2   Title  Patient will report centralization of numbness and tingling symptoms to L elbow to indicate reduced nerve irritation.    Time  6    Period  Weeks    Status  New      PT LONG TERM GOAL #3   Title  Patient will demonstrate 8+ lbs of left grip strength to improve fine motor tasks.    Time  6    Period  Weeks    Status  New      PT LONG TERM GOAL #4   Title  Patient will demonstrate 50+ degrees of active cervical rotation ROM to scan environment.    Time  6    Period  Weeks    Status  New            Plan - 09/25/17  1521    Clinical Impression Statement  Pt arrived today doing fair, but complaining of weakness in LT hand/UE and LT sided cervical pain. HEP reviewed with cues for technique. STW/TPR was performed to LT Utrap, levato, and cervical paras. Pt had good TP release in al areas with decreased pain. Normal response to modalities.    Clinical Presentation  Evolving    Rehab Potential  Fair    PT Frequency  1x / week    PT Duration  6 weeks    PT Treatment/Interventions  ADLs/Self Care Home Management;Cryotherapy;Electrical Stimulation;Gait training;Stair training;Functional mobility training;Therapeutic activities;Therapeutic exercise;Neuromuscular re-education;Patient/family education;Passive range of motion;Manual techniques    PT Next Visit Plan  chin tucks, cervical AROM, postural exercises, STW/M, modalities for pain relief.    Consulted and Agree with Plan of Care  Patient       Patient will benefit from skilled therapeutic intervention in order to improve the following deficits and impairments:  Pain, Impaired UE functional use, Decreased strength, Decreased range of motion, Postural dysfunction  Visit Diagnosis: Cervicalgia  Radiculopathy, cervical region  Muscle weakness (generalized)     Problem List Patient Active Problem List   Diagnosis Date Noted  . Chronic diarrhea 08/08/2017  . S/P left TKA 04/10/2017  . Unstable gait 01/12/2017  . Generalized osteoarthritis of hand 01/12/2017  . RLS (restless legs syndrome) 11/09/2016  . GERD (gastroesophageal reflux disease) 07/13/2016  . Class 2 severe obesity due to excess calories with serious comorbidity and body mass index (BMI) of 36.0 to 36.9 in adult ([place]) 06/01/2016  . Vitamin D deficiency 06/01/2016  . Vitamin B12 deficiency 03/27/2016  . Chronic cough 03/16/2016  . Left foot pain 03/16/2016  . Demand ischemia ([place]) 02/14/2015  . Degenerative drusen 07/13/2013  . Type 2 diabetes mellitus without ophthalmic manifestations  ([place]) 07/13/2013  . DM (diabetes mellitus) type II controlled, neurological manifestation ([place]) 05/07/2012  . Paroxysmal atrial tachycardia ([place]) 05/07/2012  .  Essential hypertension, benign 05/07/2012  . Depression 05/07/2012  . Hyperlipidemia 05/07/2012  . Fibromyalgia 05/07/2012    Kazue Cerro,CHRIS, PTA 09/25/2017, 4:51 PM  [facility] [place], [place], [address] Phone: [phone]   Fax:  [phone]  Name: OMEKA HOLBEN MRN: [id number] Date of Birth: [birth year]

## 2017-09-26 ENCOUNTER — Ambulatory Visit (INDEPENDENT_AMBULATORY_CARE_PROVIDER_SITE_OTHER): Payer: PPO | Admitting: Family Medicine

## 2017-09-26 ENCOUNTER — Encounter: Payer: Self-pay | Admitting: Family Medicine

## 2017-09-26 VITALS — BP 120/70 | HR 71 | Temp 98.3°F | Resp 16 | Ht 63.0 in | Wt 202.0 lb

## 2017-09-26 DIAGNOSIS — E1140 Type 2 diabetes mellitus with diabetic neuropathy, unspecified: Secondary | ICD-10-CM

## 2017-09-26 DIAGNOSIS — H8110 Benign paroxysmal vertigo, unspecified ear: Secondary | ICD-10-CM

## 2017-09-26 DIAGNOSIS — J3489 Other specified disorders of nose and nasal sinuses: Secondary | ICD-10-CM

## 2017-09-26 DIAGNOSIS — R2681 Unsteadiness on feet: Secondary | ICD-10-CM | POA: Diagnosis not present

## 2017-09-26 DIAGNOSIS — Z794 Long term (current) use of insulin: Secondary | ICD-10-CM | POA: Diagnosis not present

## 2017-09-26 DIAGNOSIS — H9193 Unspecified hearing loss, bilateral: Secondary | ICD-10-CM | POA: Diagnosis not present

## 2017-09-26 MED ORDER — MECLIZINE HCL 25 MG PO TABS: 25.0000 mg | ORAL_TABLET | Freq: Two times a day (BID) | ORAL | 0 refills | Status: DC | PRN

## 2017-09-26 MED ORDER — IPRATROPIUM BROMIDE 0.06 % NA SOLN: 2.0000 | Freq: Four times a day (QID) | NASAL | 4 refills | Status: DC

## 2017-09-26 NOTE — Patient Instructions (Addendum)
A few things to remember from today's visit:   Bilateral hearing loss, unspecified hearing loss type - Plan: Ambulatory referral to ENT  Controlled type 2 diabetes mellitus with diabetic neuropathy, with long-term current use of insulin (HCC)  Chronic diarrhea  Benign paroxysmal positional vertigo, unspecified laterality  Unstable gait  Metformin could be aggravating your diarrhea, so start metformin. Continue same dose of Lantus, 60 units. Continue monitoring blood sugars. You have some stable gait, consider using a cane or a walker. Fall precautions at home.   Referral to ENT was placed today. The dizziness you are reporting seems to be vertigo, related to internal ear. Meclizine 25 mg at bedtime may help, you could take medication twice per day but it can cause drowsiness.   Please be sure medication list is accurate. If a new problem present, please set up appointment sooner than planned today.

## 2017-09-26 NOTE — Progress Notes (Signed)
HPI:   [name] is a 77 y.o. female, who is here today to follow on recent ER visit. I saw her here in the office on 09/18/2017. She was seen in the ER on 09/20/2017 complaining of headache and weakness.    Troponin normal. Lab Results  Component Value Date   WBC 8.2 09/21/2017   HGB 13.7 09/21/2017   HCT 41.4 09/21/2017   MCV 87.7 09/21/2017   PLT 196 09/21/2017   Lab Results  Component Value Date   CREATININE 1.08 (H) 09/21/2017   BUN 27 (H) 09/21/2017   NA 138 09/21/2017   K 4.1 09/21/2017   CL 104 09/21/2017   CO2 20 (L) 09/21/2017    Head CT was done on 09/21/2017:  1. No acute intracranial pathology seen on CT. 2. Mild cortical volume loss noted. 3. Mucus retention cyst or polyp at the right maxillary sinus.  Today she is c/o bilateral tinnitus,dizziness,and some hearing loss. Tinnitus "all my life" Difficulty with hearing in the past couple months and getting worse. She would like ENT evaluation.   Dizziness, spinning sensation for about 6 weeks. Deneis prior Hx. She had spinning sensation this morning. Intermittent. It can last up to 6 hours. Exacerbated by head movement. She has not identified alleviating factors.  She is upset because in the ER she was recommended assisted living. She feels like her family wants "to get rid of her."   Feeling  unbalanced "weakness."   Yesterday she went to rehab and massage/TENS unit on left trapezium and "felt good." Cervical ROM seems to be improving with PT.  Anxiety and depression: She is currently on Zoloft 100 mg daily She states that about 25-30 years ago she was admitted for 72 hours in psych hospital. 25-30 years ago.  She is also complaining about profuse clear rhinorrhea since 07/2017 and every time she eats, it is worse with eating hot food. No nose pruritus, nasal bleed, or nasal congestion. She has not used OTC medications. Problem has been going on for a few months.  DM II: Sjhe is  still on Metformin.She was instructed to stop Metformin because she has had diarrhea for some time. She is also on Lantus 60 U daily. BS's "good."   Review of Systems  Constitutional: Positive for fatigue. Negative for activity change, appetite change and fever.  HENT: Positive for postnasal drip and rhinorrhea. Negative for mouth sores, nosebleeds and trouble swallowing.   Eyes: Negative for redness and visual disturbance.  Respiratory: Negative for cough, shortness of breath and wheezing.   Cardiovascular: Negative for chest pain and palpitations.  Gastrointestinal: Negative for abdominal pain, nausea and vomiting.       Negative for changes in bowel habits.  Genitourinary: Negative for decreased urine volume and hematuria.  Musculoskeletal: Positive for arthralgias, back pain and myalgias.  Skin: Negative for rash and wound.  Allergic/Immunologic: Positive for environmental allergies.  Neurological: Positive for headaches. Negative for syncope and weakness.  Psychiatric/Behavioral: Negative for confusion. The patient is nervous/anxious.       Current Outpatient Medications on File Prior to Visit  Medication Sig Dispense Refill  . acetaminophen (TYLENOL) 325 MG tablet Take 325 mg by mouth daily as needed (for pain.).    Marland Kitchen aspirin EC 81 MG tablet Take 81 mg by mouth every evening.    . Calcium Carb-Cholecalciferol (CALCIUM 600+D3 PO) Take 1 tablet by mouth every evening.    . cholecalciferol (VITAMIN D) 1000 units tablet Take  1,000 Units by mouth daily.    . Cranberry 1000 MG CAPS Take 1 capsule by mouth 2 (two) times daily. + Vit C 40 mg    . digoxin (LANOXIN) 0.125 MG tablet Take 0.125 mg by mouth daily.    Marland Kitchen ezetimibe (ZETIA) 10 MG tablet Take 1 tablet (10 mg total) by mouth daily. (Patient taking differently: Take 10 mg by mouth every evening. ) 90 tablet 2  . ibuprofen (ADVIL,MOTRIN) 200 MG tablet Take 200 mg by mouth daily as needed (for pain.).    . Insulin Glargine (BASAGLAR  KWIKPEN) 100 UNIT/ML SOPN Inject 0.6 mLs (60 Units total) into the skin daily. (Patient taking differently: Inject 60 Units into the skin at bedtime. ) 3 pen 1  . loperamide (IMODIUM A-D) 2 MG tablet Take 2 mg by mouth 4 (four) times daily as needed for diarrhea or loose stools.    . metFORMIN (GLUCOPHAGE) 1000 MG tablet Take 1,000 mg by mouth 2 (two) times daily with a meal.    . methocarbamol (ROBAXIN) 500 MG tablet Take 1 tablet (500 mg total) by mouth every 6 (six) hours as needed for muscle spasms. 40 tablet 0  . metoprolol succinate (TOPROL-XL) 25 MG 24 hr tablet Take 1 tablet (25 mg total) by mouth daily. 90 tablet 1  . Multiple Vitamins-Minerals (PRESERVISION AREDS 2 PO) Take 1 capsule by mouth 2 (two) times daily.    . nitroGLYCERIN (NITROSTAT) 0.4 MG SL tablet Place 0.4 mg under the tongue every 5 (five) minutes as needed. As needed chest pain    . omeprazole (PRILOSEC) 20 MG capsule Take 1 capsule (20 mg total) by mouth daily. 90 capsule 2  . ramipril (ALTACE) 10 MG capsule TAKE 1 CAPSULE BY MOUTH EVERY DAY. (Patient taking differently: Take 10 mg by mouth every evening. ) 90 capsule 0  . RELION INSULIN SYRINGE 1ML/31G 31G X 5/16" 1 ML MISC Use as directed 100 each 1  . rOPINIRole (REQUIP) 1 MG tablet Take 1 tablet (1 mg total) by mouth at bedtime. 90 tablet 1  . sertraline (ZOLOFT) 100 MG tablet Take 1 tablet (100 mg total) by mouth daily. (Patient taking differently: Take 100 mg by mouth every evening. ) 30 tablet 4  . verapamil (CALAN) 120 MG tablet Take 120 mg by mouth as needed (for heart).    . verapamil (VERELAN PM) 240 MG 24 hr capsule Take 1 capsule (240 mg total) by mouth daily. 30 capsule 2   No current facility-administered medications on file prior to visit.      Past Medical History:  Diagnosis Date  . Anemia    during 1st pregnancy  . Anxiety   . Arthritis   . Complication of anesthesia   . Depression   . Diarrhea, functional   . Dysrhythmia    Paroysmal Atrial  Tachycardia  . Essential hypertension   . Fibromyalgia   . GERD (gastroesophageal reflux disease)   . H/O acute pancreatitis   . History of hiatal hernia   . Hyperlipemia   . Pneumonia   . PONV (postoperative nausea and vomiting)   . PSVT (paroxysmal supraventricular tachycardia) ([place])   . Reflux   . Restless leg syndrome   . Type 2 diabetes mellitus (HCC)    Allergies  Allergen Reactions  . Amoxil [Amoxicillin] Swelling and Other (See Comments)    Lips Has patient had a PCN reaction causing immediate rash, facial/tongue/throat swelling, SOB or lightheadedness with hypotension:YES Has patient had a PCN reaction  causing severe rash involving mucus membranes or skin necrosis:No Has patient had a PCN reaction that required hospitalization:No Has patient had a PCN reaction occurring within the last 10 years:Yes. If all of the above answers are "NO", then may proceed with Cephalosporin use.   . Chocolate Hives  . Demerol Nausea And Vomiting  . Lisinopril Itching and Swelling  . Meperidine   . Meperidine Hcl     Social History   Socioeconomic History  . Marital status: Widowed    Spouse name: Glendell Docker  . Number of children: 2  . Years of education: Not on file  . Highest education level: Not on file  Occupational History  . Not on file  Social Needs  . Financial resource strain: Not on file  . Food insecurity:    Worry: Not on file    Inability: Not on file  . Transportation needs:    Medical: Not on file    Non-medical: Not on file  Tobacco Use  . Smoking status: Never Smoker  . Smokeless tobacco: Never Used  Substance and Sexual Activity  . Alcohol use: No  . Drug use: No  . Sexual activity: Not on file  Lifestyle  . Physical activity:    Days per week: Not on file    Minutes per session: Not on file  . Stress: Not on file  Relationships  . Social connections:    Talks on phone: Not on file    Gets together: Not on file    Attends religious service: Not on  file    Active member of club or organization: Not on file    Attends meetings of clubs or organizations: Not on file    Relationship status: Not on file  Other Topics Concern  . Not on file  Social History Narrative   Lives with husband.  Independent of ADLs.    Vitals:   09/26/17 1123  BP: 120/70  Pulse: 71  Resp: 16  Temp: 98.3 F (36.8 C)  SpO2: 97%   Body mass index is 35.78 kg/m.   Physical Exam  Nursing note and vitals reviewed. Constitutional: She is oriented to person, place, and time. She appears well-developed. No distress.  HENT:  Head: Normocephalic and atraumatic.  Mouth/Throat: Oropharynx is clear and moist and mucous membranes are normal.  Eyes: Pupils are equal, round, and reactive to light. Conjunctivae are normal.  Cardiovascular: Normal rate and regular rhythm.  No murmur heard. Pulses:      Dorsalis pedis pulses are 2+ on the right side, and 2+ on the left side.  Respiratory: Effort normal and breath sounds normal. No respiratory distress.  GI: Soft. She exhibits no mass. There is no hepatomegaly. There is no tenderness.  Musculoskeletal: She exhibits edema (trace pitting edema LE,bilateral).  Lymphadenopathy:    She has no cervical adenopathy.  Neurological: She is alert and oriented to person, place, and time. She has normal strength. No cranial nerve deficit. Gait normal.  Skin: Skin is warm. No rash noted. No erythema.  Psychiatric: Her mood appears anxious.  Well groomed, good eye contact.    ASSESSMENT AND PLAN:  Ms. Ursala was seen today for er follow-up.  Orders Placed This Encounter  Procedures  . Ambulatory referral to ENT      Hearing Screening   125Hz  250Hz  500Hz  1000Hz  2000Hz  3000Hz  4000Hz  6000Hz  8000Hz   Right ear:  Pass Pass Pass Pass  Fail    Left ear:  Pass Pass Pass Pass  fail  Bilateral hearing loss, unspecified hearing loss type  Possible etiologies discussed, ?neurosensorial. Instructed about warning  signs. ENT referral placed.  -     Ambulatory referral to ENT  Controlled type 2 diabetes mellitus with diabetic neuropathy, with long-term current use of insulin (HCC)  Metformin could be aggravating diarrhea. She was instructed again to stop med and continue with Lantus 60 U. She will let me know if BS's > 200.  Benign paroxysmal positional vertigo, unspecified laterality  We discussed other possible etiologies of dizziness, Hx suggest benign vertigo. I do not think further work-up is necessary at this time. Explained that problem can be recurrent. Fall prevention. . Meclizine 25 mg tid prn, some side effects discussed. Instructed about warning signs. F/U as needed.  -     meclizine (ANTIVERT) 25 MG tablet; Take 1 tablet (25 mg total) by mouth 2 (two) times daily as needed for up to 15 days for dizziness.  Unstable gait  Fall precautions discussed. She does not want to use a cane or walker.  Rhinorrhea  ? Vasomotor rhinitis. Atrovent nasal spray recommended. Nasal irrigations with saline 5 or more times per day.  -     ipratropium (ATROVENT) 0.06 % nasal spray; Place 2 sprays into both nostrils 4 (four) times daily.     Kyiah Canepa G. [name], MD  [facility]. [place] office.

## 2017-10-02 ENCOUNTER — Encounter: Payer: Self-pay | Admitting: Physical Therapy

## 2017-10-02 ENCOUNTER — Ambulatory Visit: Payer: PPO | Admitting: Physical Therapy

## 2017-10-02 DIAGNOSIS — M5412 Radiculopathy, cervical region: Secondary | ICD-10-CM

## 2017-10-02 DIAGNOSIS — M542 Cervicalgia: Secondary | ICD-10-CM

## 2017-10-02 DIAGNOSIS — M6281 Muscle weakness (generalized): Secondary | ICD-10-CM

## 2017-10-02 NOTE — Therapy (Signed)
[facility] [place], [place], [address] Phone: [phone]   Fax:  [phone]  Physical Therapy Treatment  Patient Details  Name: SHAWNELLE SPOERL MRN: [id number] Date of Birth: [birth year] Referring Provider: Simeon Craft, PA-C   Encounter Date: 10/02/2017  PT End of Session - 10/02/17 1516    Visit Number  3    Number of Visits  6    Date for PT Re-Evaluation  11/14/17    PT Start Time  2992    PT Stop Time  1559    PT Time Calculation (min)  42 min    Activity Tolerance  Patient tolerated treatment well    Behavior During Therapy  [facility] for tasks assessed/performed       Past Medical History:  Diagnosis Date  . Anemia    during 1st pregnancy  . Anxiety   . Arthritis   . Complication of anesthesia   . Depression   . Diarrhea, functional   . Dysrhythmia    Paroysmal Atrial Tachycardia  . Essential hypertension   . Fibromyalgia   . GERD (gastroesophageal reflux disease)   . H/O acute pancreatitis   . History of hiatal hernia   . Hyperlipemia   . Pneumonia   . PONV (postoperative nausea and vomiting)   . PSVT (paroxysmal supraventricular tachycardia) ([place])   . Reflux   . Restless leg syndrome   . Type 2 diabetes mellitus ([place])     Past Surgical History:  Procedure Laterality Date  . ABDOMINAL HYSTERECTOMY    . ANKLE SURGERY    . BACK SURGERY     T12-L1 fusion, Anterior Cervical fusion  . CARDIAC CATHETERIZATION N/A 02/16/2015   Procedure: Left Heart Cath and Coronary Angiography;  Surgeon: Sherren Mocha, MD;  Location: [place] [facility];  Service: Cardiovascular;  Laterality: N/A;  . CHOLECYSTECTOMY    . COLONOSCOPY    . ESOPHAGOGASTRODUODENOSCOPY  12/21/2010   Procedure: ESOPHAGOGASTRODUODENOSCOPY (EGD);  Surgeon: Landry Dyke, MD;  Location: [facility];  Service: Endoscopy;  Laterality: N/A;  . EXCISION MORTON'S NEUROMA    . FRACTURE SURGERY    . KNEE SURGERY     x 3  . NECK SURGERY    .  ROTATOR CUFF REPAIR    . TONSILLECTOMY    . TOTAL KNEE ARTHROPLASTY Left 04/10/2017   Procedure: LEFT TOTAL KNEE ARTHROPLASTY;  Surgeon: Paralee Cancel, MD;  Location: [facility];  Service: Orthopedics;  Laterality: Left;  70 mins  . UPPER GASTROINTESTINAL ENDOSCOPY    . WRIST SURGERY      There were no vitals filed for this visit.  Subjective Assessment - 10/02/17 1516    Subjective  Reports that she can now turn her head to the L better today. But reports that her L pointer finger is still contracted.    Pertinent History  C4-C6 spinal fusion, L TKA, right RTC, left wrist and left ankle surgery. Athritis and DM, HTN, Fibromyalgia    Limitations  House hold activities;Lifting    Diagnostic tests  MRI and Nerve Conduction Study    Patient Stated Goals  decrease pain, improve use of L hand and sew again.    Currently in Pain?  No/denies         [facility] PT Assessment - 10/02/17 0001      Assessment   Medical Diagnosis  Cervicalgia    Hand Dominance  Right    Next MD Visit  August 26th    Prior Therapy  Yes, for knees      Precautions   Precautions  None                   [facility] Adult PT Treatment/Exercise - 10/02/17 0001      Exercises   Exercises  Neck;Shoulder      Neck Exercises: Standing   Wall Push Ups  20 reps      Neck Exercises: Seated   Neck Retraction  15 reps    Cervical Rotation  Right;Left;20 reps    X to V  15 reps    Other Seated Exercise  B shoulder retraction       Shoulder Exercises: Seated   Row  Strengthening;Both;20 reps;Theraband    Theraband Level (Shoulder Row)  Level 1 (Yellow)    External Rotation  Strengthening;Both;20 reps;Theraband   + B cervical pain   Theraband Level (Shoulder External Rotation)  Level 1 (Yellow)      Modalities   Modalities  Electrical Stimulation      Electrical Stimulation   Electrical Stimulation Location  B UT/ cervical paraspinals    Electrical Stimulation Action  Pre-Mod    Electrical Stimulation  Parameters  80-150 hz x15 min    Electrical Stimulation Goals  Tone      Manual Therapy   Manual Therapy  Soft tissue mobilization    Soft tissue mobilization   STW to B Utrap, levator, and cervical paras in sitting                  PT Long Term Goals - 09/19/17 1840      PT LONG TERM GOAL #1   Title  Patient will be independent with HEP    Time  6    Period  Weeks    Status  New      PT LONG TERM GOAL #2   Title  Patient will report centralization of numbness and tingling symptoms to L elbow to indicate reduced nerve irritation.    Time  6    Period  Weeks    Status  New      PT LONG TERM GOAL #3   Title  Patient will demonstrate 8+ lbs of left grip strength to improve fine motor tasks.    Time  6    Period  Weeks    Status  New      PT LONG TERM GOAL #4   Title  Patient will demonstrate 50+ degrees of active cervical rotation ROM to scan environment.    Time  6    Period  Weeks    Status  New            Plan - 10/02/17 1609    Clinical Impression Statement  Patient tolerated today's treatment well with the cervical and postural strengthening completed. Patient did report cervical pain with B resisted ER and fatigue in R shoulder with X to V but patient reported prior RTC incident. Patient demonstrated appropriate chin tuck technique. Cervical fatigue reported by patient today as well. Patient presented with continued tightness of B UT and cervical paraspinals with patient reporting improvement and less pain following session. Normal modalities response noted following removal of the modalities.    Rehab Potential  Fair    PT Frequency  1x / week    PT Duration  6 weeks    PT Treatment/Interventions  ADLs/Self Care Home Management;Cryotherapy;Electrical Stimulation;Gait training;Stair training;Functional mobility training;Therapeutic activities;Therapeutic exercise;Neuromuscular re-education;Patient/family education;Passive range of motion;Manual techniques  PT Next Visit Plan  chin tucks, cervical AROM, postural exercises, STW/M, modalities for pain relief.    Consulted and Agree with Plan of Care  Patient       Patient will benefit from skilled therapeutic intervention in order to improve the following deficits and impairments:  Pain, Impaired UE functional use, Decreased strength, Decreased range of motion, Postural dysfunction  Visit Diagnosis: Cervicalgia  Radiculopathy, cervical region  Muscle weakness (generalized)     Problem List Patient Active Problem List   Diagnosis Date Noted  . Rhinorrhea 09/26/2017  . Chronic diarrhea 08/08/2017  . S/P left TKA 04/10/2017  . Unstable gait 01/12/2017  . Generalized osteoarthritis of hand 01/12/2017  . RLS (restless legs syndrome) 11/09/2016  . GERD (gastroesophageal reflux disease) 07/13/2016  . Class 2 severe obesity due to excess calories with serious comorbidity and body mass index (BMI) of 36.0 to 36.9 in adult ([place]) 06/01/2016  . Vitamin D deficiency 06/01/2016  . Vitamin B12 deficiency 03/27/2016  . Chronic cough 03/16/2016  . Left foot pain 03/16/2016  . Demand ischemia ([place]) 02/14/2015  . Degenerative drusen 07/13/2013  . Type 2 diabetes mellitus without ophthalmic manifestations ([place]) 07/13/2013  . DM (diabetes mellitus) type II controlled, neurological manifestation ([place]) 05/07/2012  . Paroxysmal atrial tachycardia ([place]) 05/07/2012  . Essential hypertension, benign 05/07/2012  . Depression 05/07/2012  . Hyperlipidemia 05/07/2012  . Fibromyalgia 05/07/2012    Standley Brooking, PTA 10/02/2017, 4:56 PM  [facility] [address] [place], [place], [address] Phone: [phone]   Fax:  [phone]  Name: TAMIKA SHROPSHIRE MRN: [id number] Date of Birth: 09-26-1940

## 2017-10-03 ENCOUNTER — Telehealth: Payer: Self-pay | Admitting: *Deleted

## 2017-10-03 NOTE — Telephone Encounter (Signed)
Copied from [place] [id number]. Topic: Referral - Question >> Oct 03, 2017  4:32 PM Berneta Levins wrote: Reason for CRM:  Pt states she hasn't heard from Dr. Lucia Gaskins, ENT referral.  Pt wants to be referred to someone else who can see her. Pt can be reached at [phone]

## 2017-10-04 NOTE — Telephone Encounter (Signed)
Per referral notes we attempted to contact pt on 10/01/17 to advise Dr. Lucia Gaskins does not accept her insurance. We need to know where she would like referral sent. She may need to contact her insurance company for [employer] in our area as we do not have access to a list of providers.   LMTCB

## 2017-10-08 DIAGNOSIS — M4712 Other spondylosis with myelopathy, cervical region: Secondary | ICD-10-CM | POA: Diagnosis not present

## 2017-10-09 ENCOUNTER — Ambulatory Visit: Payer: PPO | Admitting: Physical Therapy

## 2017-10-09 ENCOUNTER — Encounter: Payer: Self-pay | Admitting: Physical Therapy

## 2017-10-09 DIAGNOSIS — M6281 Muscle weakness (generalized): Secondary | ICD-10-CM

## 2017-10-09 DIAGNOSIS — M542 Cervicalgia: Secondary | ICD-10-CM | POA: Diagnosis not present

## 2017-10-09 DIAGNOSIS — M5412 Radiculopathy, cervical region: Secondary | ICD-10-CM

## 2017-10-09 NOTE — Telephone Encounter (Signed)
I called pt back to inform her about Dr Lucia Gaskins office the audiologist doesn't accept her insurance so I scheduled her with [place] ent ok with pt  Pt scheduled for 10-18-2017@9 ;30- will see Dr Redmond Baseman @10 :00am pt has been informed and is ok with appt [facility], [address]. [facility] [address] Phone [phone] Fax [phone]

## 2017-10-09 NOTE — Therapy (Signed)
[facility] [place], [place], [address] Phone: [phone]   Fax:  [phone]  Physical Therapy Treatment  Patient Details  Name: [name] MRN: [id number] Date of Birth: 1940/09/24 Referring Provider: Simeon Craft, PA-C   Encounter Date: 10/09/2017  PT End of Session - 10/09/17 1519    Visit Number  4    Number of Visits  6    Date for PT Re-Evaluation  11/14/17    PT Start Time  5631    PT Stop Time  1605    PT Time Calculation (min)  48 min    Activity Tolerance  Patient tolerated treatment well    Behavior During Therapy  [facility] for tasks assessed/performed       Past Medical History:  Diagnosis Date  . Anemia    during 1st pregnancy  . Anxiety   . Arthritis   . Complication of anesthesia   . Depression   . Diarrhea, functional   . Dysrhythmia    Paroysmal Atrial Tachycardia  . Essential hypertension   . Fibromyalgia   . GERD (gastroesophageal reflux disease)   . H/O acute pancreatitis   . History of hiatal hernia   . Hyperlipemia   . Pneumonia   . PONV (postoperative nausea and vomiting)   . PSVT (paroxysmal supraventricular tachycardia) ([place])   . Reflux   . Restless leg syndrome   . Type 2 diabetes mellitus ([place])     Past Surgical History:  Procedure Laterality Date  . ABDOMINAL HYSTERECTOMY    . ANKLE SURGERY    . BACK SURGERY     T12-L1 fusion, Anterior Cervical fusion  . CARDIAC CATHETERIZATION N/A 02/16/2015   Procedure: Left Heart Cath and Coronary Angiography;  Surgeon: Sherren Mocha, MD;  Location: [place];  Service: Cardiovascular;  Laterality: N/A;  . CHOLECYSTECTOMY    . COLONOSCOPY    . ESOPHAGOGASTRODUODENOSCOPY  12/21/2010   Procedure: ESOPHAGOGASTRODUODENOSCOPY (EGD);  Surgeon: Landry Dyke, MD;  Location: [facility];  Service: Endoscopy;  Laterality: N/A;  . EXCISION MORTON'S NEUROMA    . FRACTURE SURGERY    . KNEE SURGERY     x 3  . NECK SURGERY    .  ROTATOR CUFF REPAIR    . TONSILLECTOMY    . TOTAL KNEE ARTHROPLASTY Left 04/10/2017   Procedure: LEFT TOTAL KNEE ARTHROPLASTY;  Surgeon: Paralee Cancel, MD;  [facility]: [facility];  Service: Orthopedics;  Laterality: Left;  70 mins  . UPPER GASTROINTESTINAL ENDOSCOPY    . WRIST SURGERY      There were no vitals filed for this visit.  Subjective Assessment - 10/09/17 1518    Subjective  Reports that she doesn't hurt anywhere. Had an injection of cortizone in her neck. Reports being able to complete hobbies such as sewing as well as ADLs at home.    Pertinent History  C4-C6 spinal fusion, L TKA, right RTC, left wrist and left ankle surgery. Athritis and DM, HTN, Fibromyalgia    Limitations  House hold activities;Lifting    Diagnostic tests  MRI and Nerve Conduction Study    Patient Stated Goals  decrease pain, improve use of L hand and sew again.    Currently in Pain?  No/denies         [facility] PT Assessment - 10/09/17 0001      Assessment   Medical Diagnosis  Cervicalgia    Hand Dominance  Right    Next MD Visit  10/2017  Prior Therapy  Yes, for knees      Precautions   Precautions  None      ROM / Strength   AROM / PROM / Strength  AROM      AROM   Overall AROM   Deficits    AROM Assessment Site  Cervical    Cervical - Right Rotation  35    Cervical - Left Rotation  40                   [facility] Adult PT Treatment/Exercise - 10/09/17 0001      Exercises   Exercises  Neck;Shoulder      Neck Exercises: Machines for Strengthening   UBE (Upper Arm Bike)  120 RPM x4 min    forward, backward     Neck Exercises: Theraband   Rows  20 reps;Limitations    Rows Limitations  Pink XTS    Shoulder External Rotation  20 reps;Red   with ball for stabilization   Horizontal ABduction  20 reps;Red   with ball for stabilization     Neck Exercises: Seated   Neck Retraction  15 reps;5 secs    Cervical Rotation  Both;10 reps    X to V  15 reps    Shoulder Rolls  15  reps;Backwards;Forwards      Modalities   Modalities  Electrical Stimulation;Moist Heat      Moist Heat Therapy   Number Minutes Moist Heat  15 Minutes    Moist Heat Location  Cervical      Electrical Stimulation   Electrical Stimulation Location  B UT/ cervical paraspinals    Electrical Stimulation Action  Pre-Mod    Electrical Stimulation Parameters  80-150 hz x15 min    Electrical Stimulation Goals  Tone      Manual Therapy   Manual Therapy  Soft tissue mobilization    Soft tissue mobilization   STW to L Utrap, levator, and cervical paras in sitting                  PT Long Term Goals - 10/09/17 1601      PT LONG TERM GOAL #1   Title  Patient will be independent with HEP    Time  6    Period  Weeks    Status  Achieved      PT LONG TERM GOAL #2   Title  Patient will report centralization of numbness and tingling symptoms to L elbow to indicate reduced nerve irritation.    Time  6    Period  Weeks    Status  On-going   Comes to fingertips per patient report but inconsistant per patient report 10/09/2017     PT LONG TERM GOAL #3   Title  Patient will demonstrate 8+ lbs of left grip strength to improve fine motor tasks.    Time  6    Period  Weeks    Status  On-going      PT LONG TERM GOAL #4   Title  Patient will demonstrate 50+ degrees of active cervical rotation ROM to scan environment.    Time  6    Period  Weeks    Status  On-going            Plan - 10/09/17 1555    Clinical Impression Statement  Patient tolerated today's treatment well with no complaints of cervical pain upon arrival. Patient able to complete many postural and cervical strengthening and  stabilization exercises without complaint of pain. Patient continues to present with moderate L UT tightness upon palpation with minimal release during STW session. Normal modalities response noted following removal of the modalities. Deficits in B cervical rotation noted but improvement noted  with L cervical rotation.    Rehab Potential  Fair    PT Frequency  1x / week    PT Duration  6 weeks    PT Treatment/Interventions  ADLs/Self Care Home Management;Cryotherapy;Electrical Stimulation;Gait training;Stair training;Functional mobility training;Therapeutic activities;Therapeutic exercise;Neuromuscular re-education;Patient/family education;Passive range of motion;Manual techniques    PT Next Visit Plan  chin tucks, cervical AROM, postural exercises, STW/M, modalities for pain relief.    Consulted and Agree with Plan of Care  Patient       Patient will benefit from skilled therapeutic intervention in order to improve the following deficits and impairments:  Pain, Impaired UE functional use, Decreased strength, Decreased range of motion, Postural dysfunction  Visit Diagnosis: Cervicalgia  Radiculopathy, cervical region  Muscle weakness (generalized)     Problem List Patient Active Problem List   Diagnosis Date Noted  . Rhinorrhea 09/26/2017  . Chronic diarrhea 08/08/2017  . S/P left TKA 04/10/2017  . Unstable gait 01/12/2017  . Generalized osteoarthritis of hand 01/12/2017  . RLS (restless legs syndrome) 11/09/2016  . GERD (gastroesophageal reflux disease) 07/13/2016  . Class 2 severe obesity due to excess calories with serious comorbidity and body mass index (BMI) of 36.0 to 36.9 in adult ([place]) 06/01/2016  . Vitamin D deficiency 06/01/2016  . Vitamin B12 deficiency 03/27/2016  . Chronic cough 03/16/2016  . Left foot pain 03/16/2016  . Demand ischemia ([place]) 02/14/2015  . Degenerative drusen 07/13/2013  . Type 2 diabetes mellitus without ophthalmic manifestations ([place]) 07/13/2013  . DM (diabetes mellitus) type II controlled, neurological manifestation ([place]) 05/07/2012  . Paroxysmal atrial tachycardia ([place]) 05/07/2012  . Essential hypertension, benign 05/07/2012  . Depression 05/07/2012  . Hyperlipidemia 05/07/2012  . Fibromyalgia 05/07/2012    Standley Brooking,  PTA 10/09/2017, 4:15 PM  [facility] [place], [place], [address] Phone: [phone]   Fax:  [phone]  Name: [name] MRN: [id number] Date of Birth: 1940-02-26

## 2017-10-09 NOTE — Telephone Encounter (Signed)
Please follow up with pt. Thanks!

## 2017-10-13 ENCOUNTER — Other Ambulatory Visit: Payer: Self-pay | Admitting: Family Medicine

## 2017-10-13 DIAGNOSIS — H8110 Benign paroxysmal vertigo, unspecified ear: Secondary | ICD-10-CM

## 2017-10-16 ENCOUNTER — Ambulatory Visit: Payer: PPO | Attending: Neurosurgery | Admitting: Physical Therapy

## 2017-10-16 ENCOUNTER — Encounter: Payer: Self-pay | Admitting: Physical Therapy

## 2017-10-16 DIAGNOSIS — M5412 Radiculopathy, cervical region: Secondary | ICD-10-CM | POA: Insufficient documentation

## 2017-10-16 DIAGNOSIS — M6281 Muscle weakness (generalized): Secondary | ICD-10-CM | POA: Insufficient documentation

## 2017-10-16 DIAGNOSIS — M542 Cervicalgia: Secondary | ICD-10-CM | POA: Insufficient documentation

## 2017-10-16 NOTE — Therapy (Signed)
[facility] [place], [place], [address] Phone: [phone]   Fax:  [phone]  Physical Therapy Treatment  Patient Details  Name: [name] MRN: [id number] Date of Birth: 1941/01/22 Referring Provider: Simeon Craft, PA-C   Encounter Date: 10/16/2017  PT End of Session - 10/16/17 1647    Visit Number  5    Number of Visits  6    Date for PT Re-Evaluation  11/14/17    PT Start Time  0315    PT Stop Time  0404    PT Time Calculation (min)  49 min       Past Medical History:  Diagnosis Date  . Anemia    during 1st pregnancy  . Anxiety   . Arthritis   . Complication of anesthesia   . Depression   . Diarrhea, functional   . Dysrhythmia    Paroysmal Atrial Tachycardia  . Essential hypertension   . Fibromyalgia   . GERD (gastroesophageal reflux disease)   . H/O acute pancreatitis   . History of hiatal hernia   . Hyperlipemia   . Pneumonia   . PONV (postoperative nausea and vomiting)   . PSVT (paroxysmal supraventricular tachycardia) ([place])   . Reflux   . Restless leg syndrome   . Type 2 diabetes mellitus ([place])     Past Surgical History:  Procedure Laterality Date  . ABDOMINAL HYSTERECTOMY    . ANKLE SURGERY    . BACK SURGERY     T12-L1 fusion, Anterior Cervical fusion  . CARDIAC CATHETERIZATION N/A 02/16/2015   Procedure: Left Heart Cath and Coronary Angiography;  Surgeon: Sherren Mocha, MD;  Location: [facility] [facility];  Service: Cardiovascular;  Laterality: N/A;  . CHOLECYSTECTOMY    . COLONOSCOPY    . ESOPHAGOGASTRODUODENOSCOPY  12/21/2010   Procedure: ESOPHAGOGASTRODUODENOSCOPY (EGD);  Surgeon: Landry Dyke, MD;  Location: [facility];  Service: Endoscopy;  Laterality: N/A;  . EXCISION MORTON'S NEUROMA    . FRACTURE SURGERY    . KNEE SURGERY     x 3  . NECK SURGERY    . ROTATOR CUFF REPAIR    . TONSILLECTOMY    . TOTAL KNEE ARTHROPLASTY Left 04/10/2017   Procedure: LEFT TOTAL KNEE  ARTHROPLASTY;  Surgeon: Paralee Cancel, MD;  Location: [facility];  Service: Orthopedics;  Laterality: Left;  70 mins  . UPPER GASTROINTESTINAL ENDOSCOPY    . WRIST SURGERY      There were no vitals filed for this visit.  Subjective Assessment - 10/16/17 1647    Subjective  No new complaints.    Diagnostic tests  MRI and Nerve Conduction Study    Patient Stated Goals  decrease pain, improve use of L hand and sew again.    Currently in Pain?  Yes    Pain Score  2     Pain Location  Head    Pain Orientation  Right;Left    Pain Descriptors / Indicators  Aching    Pain Type  Acute pain    Pain Onset  More than a month ago                       [facility] Adult PT Treatment/Exercise - 10/16/17 0001      Exercises   Exercises  Neck      Modalities   Modalities  Electrical Stimulation;Moist Heat;Ultrasound      Moist Heat Therapy   Number Minutes Moist Heat  13 Minutes  Moist Heat Location  Cervical      Electrical Stimulation   Electrical Stimulation Location  Bilateral UT's.    Electrical Stimulation Action  IFC    Electrical Stimulation Parameters  80-150 Hz x 20 minutes.    Electrical Stimulation Goals  Tone;Pain      Ultrasound   Ultrasound Location  --   Bilateral UT's.   Ultrasound Parameters  U/S at 1.50 W/CM2 x 12 minutes.    Ultrasound Goals  Pain      Manual Therapy   Manual Therapy  Soft tissue mobilization    Soft tissue mobilization  STW/M to bilateral UT's to decrease tone x 12 minutes.                  PT Long Term Goals - 10/09/17 1601      PT LONG TERM GOAL #1   Title  Patient will be independent with HEP    Time  6    Period  Weeks    Status  Achieved      PT LONG TERM GOAL #2   Title  Patient will report centralization of numbness and tingling symptoms to L elbow to indicate reduced nerve irritation.    Time  6    Period  Weeks    Status  On-going   Comes to fingertips per patient report but inconsistant per patient report  10/09/2017     PT LONG TERM GOAL #3   Title  Patient will demonstrate 8+ lbs of left grip strength to improve fine motor tasks.    Time  6    Period  Weeks    Status  On-going      PT LONG TERM GOAL #4   Title  Patient will demonstrate 50+ degrees of active cervical rotation ROM to scan environment.    Time  6    Period  Weeks    Status  On-going            Plan - 10/16/17 1658    Clinical Impression Statement  Patient did well with treatment today.  Increased tone in left levator scapulae effectively reduced with STW/M.    PT Treatment/Interventions  ADLs/Self Care Home Management;Cryotherapy;Electrical Stimulation;Gait training;Stair training;Functional mobility training;Therapeutic activities;Therapeutic exercise;Neuromuscular re-education;Patient/family education;Passive range of motion;Manual techniques    PT Next Visit Plan  chin tucks, cervical AROM, postural exercises, STW/M, modalities for pain relief.    Consulted and Agree with Plan of Care  Patient       Patient will benefit from skilled therapeutic intervention in order to improve the following deficits and impairments:  Pain, Impaired UE functional use, Decreased strength, Decreased range of motion, Postural dysfunction  Visit Diagnosis: Cervicalgia  Radiculopathy, cervical region     Problem List Patient Active Problem List   Diagnosis Date Noted  . Rhinorrhea 09/26/2017  . Chronic diarrhea 08/08/2017  . S/P left TKA 04/10/2017  . Unstable gait 01/12/2017  . Generalized osteoarthritis of hand 01/12/2017  . RLS (restless legs syndrome) 11/09/2016  . GERD (gastroesophageal reflux disease) 07/13/2016  . Class 2 severe obesity due to excess calories with serious comorbidity and body mass index (BMI) of 36.0 to 36.9 in adult ([place]) 06/01/2016  . Vitamin D deficiency 06/01/2016  . Vitamin B12 deficiency 03/27/2016  . Chronic cough 03/16/2016  . Left foot pain 03/16/2016  . Demand ischemia ([place]) 02/14/2015   . Degenerative drusen 07/13/2013  . Type 2 diabetes mellitus without ophthalmic manifestations ([place]) 07/13/2013  . DM (diabetes  mellitus) type II controlled, neurological manifestation ([place]) 05/07/2012  . Paroxysmal atrial tachycardia ([place]) 05/07/2012  . Essential hypertension, benign 05/07/2012  . Depression 05/07/2012  . Hyperlipidemia 05/07/2012  . Fibromyalgia 05/07/2012    Pattie Flaharty, [place] MPT 10/16/2017, 5:00 PM  [facility] [place], [place], [address] Phone: [phone]   Fax:  [phone]  Name: CASILDA PICKERILL MRN: [id number] Date of Birth: 06-21-1940

## 2017-10-19 ENCOUNTER — Ambulatory Visit: Payer: Self-pay | Admitting: Family Medicine

## 2017-10-19 ENCOUNTER — Other Ambulatory Visit: Payer: Self-pay | Admitting: Family Medicine

## 2017-10-19 ENCOUNTER — Ambulatory Visit (INDEPENDENT_AMBULATORY_CARE_PROVIDER_SITE_OTHER): Payer: PPO | Admitting: Family Medicine

## 2017-10-19 ENCOUNTER — Encounter: Payer: Self-pay | Admitting: Family Medicine

## 2017-10-19 VITALS — BP 126/80 | HR 67 | Temp 98.8°F | Resp 12 | Ht 63.0 in | Wt 204.5 lb

## 2017-10-19 DIAGNOSIS — R110 Nausea: Secondary | ICD-10-CM

## 2017-10-19 DIAGNOSIS — R944 Abnormal results of kidney function studies: Secondary | ICD-10-CM | POA: Diagnosis not present

## 2017-10-19 DIAGNOSIS — F330 Major depressive disorder, recurrent, mild: Secondary | ICD-10-CM | POA: Diagnosis not present

## 2017-10-19 DIAGNOSIS — H8110 Benign paroxysmal vertigo, unspecified ear: Secondary | ICD-10-CM

## 2017-10-19 DIAGNOSIS — H9313 Tinnitus, bilateral: Secondary | ICD-10-CM | POA: Insufficient documentation

## 2017-10-19 MED ORDER — MECLIZINE HCL 25 MG PO TABS: 25.0000 mg | ORAL_TABLET | Freq: Two times a day (BID) | ORAL | 0 refills | Status: AC | PRN

## 2017-10-19 MED ORDER — ONDANSETRON HCL 4 MG PO TABS: 4.0000 mg | ORAL_TABLET | Freq: Three times a day (TID) | ORAL | 0 refills | Status: AC | PRN

## 2017-10-19 NOTE — Telephone Encounter (Signed)
Pt called in c/o feeling dizzy and having ringing in her ears along with tingling in her fingertips.   She has been seen twice in the ED for these same symptoms and told she was having anxiety.    Pt [place]   She called her daughter who instructed her not to drive.   Her daughter is now with her in the car in the parking lot.   Daughter got on speaker phone in the car. Per daughter Dr. [name] gave her mother Meclizine for "dizziness" because that's what mother tells her.   My mother hasn't told Dr. [name] about these other symptoms she's having during these attacks so Dr. [name] is just treating the dizziness not the anxiety that the 2 ED doctors have diagnosed her with.  Pt and daughter are requesting to see Dr. [name] today if possible.   I was able to schedule her with Dr. [name] for today at 3:00 for a same day appt.   Reason for Disposition . Patient sounds very upset or troubled to the triager  Answer Assessment - Initial Assessment Questions 1. DESCRIPTION: "Describe your dizziness."     [place]  In hospital they said it was anxiety.    A week and 1/2 later happened again and my BP was real high when I went to the ED. Had a CT I have a mass in my right side sinus area.   I've seen Dr. [name] and she gave me Meclizine.   I've taken all of them.   I don't have any more left. I'm in the car now in the parking lot.   She feels dizzy and her ears are ringing.   My daughter is with me.   Daughter got on speaker phone.  Feeling tingly in her fingertips.    It's the beginning of the same thing that has happened.   2 ER doctors have told her she is having anxiety attacks.   I think Dr. [name] doesn't realize she's having anxiety attacks.   Mother tells Dr. [name] "she's dizzy".    2. LIGHTHEADED: "Do you feel lightheaded?" (e.g., somewhat faint, woozy, weak upon standing)     Yes. 3. VERTIGO: "Do you  feel like either you or the room is spinning or tilting?" (i.e. vertigo)     No 4. SEVERITY: "How bad is it?"  "Do you feel like you are going to faint?" "Can you stand and walk?"   - MILD - walking normally   - MODERATE - interferes with normal activities (e.g., work, school)    - SEVERE - unable to stand, requires support to walk, feels like passing out now.      Bad. 5. ONSET:  "When did the dizziness begin?"     Been going on for 15 minutes. 6. AGGRAVATING FACTORS: "Does anything make it worse?" (e.g., standing, change in head position)     Loud noises bothers her.   I don't know what triggered my mother today.   7. HEART RATE: "Can you tell me your heart rate?" "How many beats in 15 seconds?"  (Note: not all patients can do this)       Not asked 8. CAUSE: "What do you think is causing the dizziness?"     Anxiety  9. RECURRENT SYMPTOM: "Have you had dizziness before?" If so, ask: "When was the last time?" "What happened that time?"  Yes 10. OTHER SYMPTOMS: "Do you have any other symptoms?" (e.g., fever, chest pain, vomiting, diarrhea, bleeding)       See above 11. PREGNANCY: "Is there any chance you are pregnant?" "When was your last menstrual period?"       Not asked.  Answer Assessment - Initial Assessment Questions 1. CONCERN: "What happened that made you call today?"     It just came over me all of a sudden.   I'm sitting in the parking lot in my car at the shopping center.   My daughter is with me.   I called her and she came over.  See the dizziness protocol for more details as it was being described as dizziness at first. 2. ANXIETY SYMPTOM SCREENING: "Can you describe how you have been feeling?"  (e.g., tense, restless, panicky, anxious, keyed up, trouble sleeping, trouble concentrating)     I'm dizzy, my ears are ringing bad and having tingling in her fingertips.  It's the same thing that has happened that I've been seen in the ED for twice. 3. ONSET: "How long have you  been feeling this way?"     About 15 minutes per daughter who got on speaker phone. 4. RECURRENT: "Have you felt this way before?"  If yes: "What happened that time?" "What helped these feelings go away in the past?"      Yes.   See dizziness protocol. 5. RISK OF HARM - SUICIDAL IDEATION:  "Do you ever have thoughts of hurting or killing yourself?"  (e.g., yes, no, no but preoccupation with thoughts about death)   - INTENT:  "Do you have thoughts of hurting or killing yourself right NOW?" (e.g., yes, no, N/A)   - PLAN: "Do you have a specific plan for how you would do this?" (e.g., gun, knife, overdose, no plan, N/A)     No 6. RISK OF HARM - HOMICIDAL IDEATION:  "Do you ever have thoughts of hurting or killing someone else?"  (e.g., yes, no, no but preoccupation with thoughts about death)   - INTENT:  "Do you have thoughts of hurting or killing someone right NOW?" (e.g., yes, no, N/A)   - PLAN: "Do you have a specific plan for how you would do this?" (e.g., gun, knife, no plan, N/A)      No 7. FUNCTIONAL IMPAIRMENT: "How have things been going for you overall in your life? Have you had any more difficulties than usual doing your normal daily activities?"  (e.g., better, same, worse; self-care, school, work, interactions)     Per daughter with mother sitting next to her there is not anything that has happened today to trigger this except maybe she has been alone today because my sister and I had to work so no one has been with her. 8. SUPPORT: "Who is with you now?" "Who do you live with?" "Do you have family or friends nearby who you can talk to?"      Daughter with pt after she called her. 9. THERAPIST: "Do you have a counselor or therapist? Name?"     Not asked 10. STRESSORS: "Has there been any new stress or recent changes in your life?"       No 11. CAFFEINE ABUSE: "Do you drink caffeinated beverages, and how much each day?" (e.g., coffee, tea, colas)       Not asked 12. SUBSTANCE ABUSE:  "Do you use any illegal drugs or alcohol?"       Not asked 13. OTHER SYMPTOMS: "  Do you have any other physical symptoms right now?" (e.g., chest pain, palpitations, difficulty breathing, fever)       No.   "I just want to see Dr. [name] today if possible". 14. PREGNANCY: "Is there any chance you are pregnant?" "When was your last menstrual period?"       Not asked due to age.  Protocols used: ANXIETY AND PANIC ATTACK-A-AH, DIZZINESS - LIGHTHEADEDNESS-A-AH
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
## 2017-10-19 NOTE — Patient Instructions (Addendum)
A few things to remember from today's visit:   Mild episode of recurrent major depressive disorder (HCC)  Benign paroxysmal positional vertigo, unspecified laterality - Plan: Basic metabolic panel, meclizine (ANTIVERT) 25 MG tablet  Nausea without vomiting  Tinnitus of both ears    Dizziness is a perception of movement, it is sometimes difficult to describe and can be  caused by different problems, most benign but others can be life threaten.  Vertigo is the most common cause of dizziness, usually related with inner ear and can be associated with nausea, vomiting, and unbalance sensation. It can be complicated by falls due to lose of balance; so fall precautions are very important.  Most of the time dizziness is benign, usually intermittent, last a few seconds at the time and aggravated by certain positions. It usually resolves in a few weeks without residual effect but it could be recurrent.  Sometimes blood work is ordered to evaluate for other possible causes.  Dizziness can also be caused by certain medications, dehydration, migraines, and strokes.  Medication prescribed for vertigo, Meclizine, causes drowsiness/sleepiness, so frequently I recommended taking it at bedtime. I also recommend what we called vestibular exercise, sometimes can be done at home (Modified Semont maneuvers) other times I refer patients to vestibular rehabilitation.   Seek immediate medical attention if: New severe headache, dobble vision, fever (100 F or more), associated numbness/tingling, focal weakness, persistent vomiting, not able to walk, or sudden worsening symptoms.  Tinnitus Tinnitus refers to hearing a sound when there is no actual source for that sound. This is often described as ringing in the ears. However, people with this condition may hear a variety of noises. A person may hear the sound in one ear or in both ears. The sounds of tinnitus can be soft, loud, or somewhere in between. Tinnitus  can last for a few seconds or can be constant for days. It may go away without treatment and come back at various times. When tinnitus is constant or happens often, it can lead to other problems, such as trouble sleeping and trouble concentrating. Almost everyone experiences tinnitus at some point. Tinnitus that is long-lasting (chronic) or comes back often is a problem that may require medical attention. What are the causes? The cause of tinnitus is often not known. In some cases, it can result from other problems or conditions, including:  Exposure to loud noises from machinery, music, or other sources.  Hearing loss.  Ear or sinus infections.  Earwax buildup.  A foreign object in the ear.  Use of certain medicines.  Use of alcohol and caffeine.  High blood pressure.  Heart diseases.  Anemia.  Allergies.  Meniere disease.  Thyroid problems.  Tumors.  An enlarged part of a weakened blood vessel (aneurysm).  What are the signs or symptoms? The main symptom of tinnitus is hearing a sound when there is no source for that sound. It may sound like:  Buzzing.  Roaring.  Ringing.  Blowing air, similar to the sound heard when you listen to a seashell.  Hissing.  Whistling.  Sizzling.  Humming.  Running water.  A sustained musical note.  How is this diagnosed? Tinnitus is diagnosed based on your symptoms. Your health care provider will do a physical exam. A comprehensive hearing exam (audiologic exam) will be done if your tinnitus:  Affects only one ear (unilateral).  Causes hearing difficulties.  Lasts 6 months or longer.  You may also need to see a health care provider who  specializes in hearing disorders (audiologist). You may be asked to complete a questionnaire to determine the severity of your tinnitus. Tests may be done to help determine the cause and to rule out other conditions. These can include:  Imaging studies of your head and brain, such  as: ? A CT scan. ? An MRI.  An imaging study of your blood vessels (angiogram).  How is this treated? Treating an underlying medical condition can sometimes make tinnitus go away. If your tinnitus continues, other treatments may include:  Medicines, such as certain antidepressants or sleeping aids.  Sound generators to mask the tinnitus. These include: ? Tabletop sound machines that play relaxing sounds to help you fall asleep. ? Wearable devices that fit in your ear and play sounds or music. ? A small device that uses headphones to deliver a signal embedded in music (acoustic neural stimulation). In time, this may change the pathways of your brain and make you less sensitive to tinnitus. This device is used for very severe cases when no other treatment is working.  Therapy and counseling to help you manage the stress of living with tinnitus.  Using hearing aids or cochlear implants, if your tinnitus is related to hearing loss.  Follow these instructions at home:  When possible, avoid being in loud places and being exposed to loud sounds.  Wear hearing protection, such as earplugs, when you are exposed to loud noises.  Do not take stimulants, such as nicotine, alcohol, or caffeine.  Practice techniques for reducing stress, such as meditation, yoga, or deep breathing.  Use a white noise machine, a humidifier, or other devices to mask the sound of tinnitus.  Sleep with your head slightly raised. This may reduce the impact of tinnitus.  Try to get plenty of rest each night. Contact a health care provider if:  You have tinnitus in just one ear.  Your tinnitus continues for 3 weeks or longer without stopping.  Home care measures are not helping.  You have tinnitus after a head injury.  You have tinnitus along with any of the following: ? Dizziness. ? Loss of balance. ? Nausea and vomiting. This information is not intended to replace advice given to you by your health care  provider. Make sure you discuss any questions you have with your health care provider. Document Released: 01/30/2005 Document Revised: 10/03/2015 Document Reviewed: 07/02/2013 Elsevier Interactive Patient Education  2018 [employer].   Please be sure medication list is accurate. If a new problem present, please set up appointment sooner than planned today.

## 2017-10-19 NOTE — Assessment & Plan Note (Addendum)
Problem is bilateral, chronic, and getting worse. We discussed possible etiologies as well as treatment options (no known effective treatment). She takes ibuprofen for chronic pain, this could aggravate problem. She will keep appointment with Dr. Redmond Baseman, ENT.

## 2017-10-19 NOTE — Assessment & Plan Note (Signed)
She is not suicidal. She does not think she needs to see a psychiatrist but her daughter thinks she would benefit from counseling, so recommending arrange an appointment with psychotherapist. She will continue Zoloft 100 mg daily. She will keep next follow-up appointment.

## 2017-10-20 LAB — BASIC METABOLIC PANEL
BUN/Creatinine Ratio: 20 (calc) (ref 6–22)
BUN: 19 mg/dL (ref 7–25)
CO2: 23 mmol/L (ref 20–32)
Calcium: 10 mg/dL (ref 8.6–10.4)
Chloride: 104 mmol/L (ref 98–110)
Creat: 0.96 mg/dL — ABNORMAL HIGH (ref 0.60–0.93)
Glucose, Bld: 150 mg/dL — ABNORMAL HIGH (ref 65–99)
Potassium: 4.1 mmol/L (ref 3.5–5.3)
Sodium: 135 mmol/L (ref 135–146)

## 2017-10-21 ENCOUNTER — Encounter: Payer: Self-pay | Admitting: Family Medicine

## 2017-10-23 ENCOUNTER — Ambulatory Visit: Payer: PPO | Admitting: *Deleted

## 2017-10-23 DIAGNOSIS — M6281 Muscle weakness (generalized): Secondary | ICD-10-CM

## 2017-10-23 DIAGNOSIS — M542 Cervicalgia: Secondary | ICD-10-CM

## 2017-10-23 DIAGNOSIS — M5412 Radiculopathy, cervical region: Secondary | ICD-10-CM

## 2017-10-23 NOTE — Therapy (Addendum)
[facility] [place], [place], [address] Phone: [phone]   Fax:  [phone]  Physical Therapy Treatment  Patient Details  Name: [name] MRN: [id number] Date of Birth: 1940-08-30 Referring Provider: Simeon Craft, PA-C   Encounter Date: 10/23/2017  PT End of Session - 10/23/17 1546    Visit Number  6    Number of Visits  6    Date for PT Re-Evaluation  11/14/17    PT Start Time  1696    PT Stop Time  1555    PT Time Calculation (min)  50 min       Past Medical History:  Diagnosis Date  . Anemia    during 1st pregnancy  . Anxiety   . Arthritis   . Complication of anesthesia   . Depression   . Diarrhea, functional   . Dysrhythmia    Paroysmal Atrial Tachycardia  . Essential hypertension   . Fibromyalgia   . GERD (gastroesophageal reflux disease)   . H/O acute pancreatitis   . History of hiatal hernia   . Hyperlipemia   . Pneumonia   . PONV (postoperative nausea and vomiting)   . PSVT (paroxysmal supraventricular tachycardia) ([place])   . Reflux   . Restless leg syndrome   . Type 2 diabetes mellitus ([place])     Past Surgical History:  Procedure Laterality Date  . ABDOMINAL HYSTERECTOMY    . ANKLE SURGERY    . BACK SURGERY     T12-L1 fusion, Anterior Cervical fusion  . CARDIAC CATHETERIZATION N/A 02/16/2015   Procedure: Left Heart Cath and Coronary Angiography;  Surgeon: Sherren Mocha, MD;  Location: [facility];  Service: Cardiovascular;  Laterality: N/A;  . CHOLECYSTECTOMY    . COLONOSCOPY    . ESOPHAGOGASTRODUODENOSCOPY  12/21/2010   Procedure: ESOPHAGOGASTRODUODENOSCOPY (EGD);  Surgeon: Landry Dyke, MD;  Location: [facility];  Service: Endoscopy;  Laterality: N/A;  . EXCISION MORTON'S NEUROMA    . FRACTURE SURGERY    . KNEE SURGERY     x 3  . NECK SURGERY    . ROTATOR CUFF REPAIR    . TONSILLECTOMY    . TOTAL KNEE ARTHROPLASTY Left 04/10/2017   Procedure: LEFT TOTAL KNEE  ARTHROPLASTY;  Surgeon: Paralee Cancel, MD;  Location: [facility];  Service: Orthopedics;  Laterality: Left;  70 mins  . UPPER GASTROINTESTINAL ENDOSCOPY    . WRIST SURGERY      There were no vitals filed for this visit.  Subjective Assessment - 10/23/17 1754    Subjective  Still just very sore across the shldrs and LT hand is weak and my finger is still flexed    Pertinent History  C4-C6 spinal fusion, L TKA, right RTC, left wrist and left ankle surgery. Athritis and DM, HTN, Fibromyalgia    Limitations  House hold activities;Lifting    Diagnostic tests  MRI and Nerve Conduction Study    Patient Stated Goals  decrease pain, improve use of L hand and sew again.    Currently in Pain?  Yes    Pain Score  2     Pain Location  Head    Pain Orientation  Right;Left    Pain Descriptors / Indicators  Aching    Pain Type  Acute pain    Pain Onset  More than a month ago    Pain Frequency  Constant  [facility] Adult PT Treatment/Exercise - 10/23/17 0001      Exercises   Exercises  Neck      Modalities   Modalities  Electrical Stimulation;Moist Heat;Ultrasound      Moist Heat Therapy   Number Minutes Moist Heat  15 Minutes    Moist Heat Location  Cervical      Electrical Stimulation   Electrical Stimulation Location  Bil UTs    Electrical Stimulation Action  IFC    Electrical Stimulation Parameters  80-_0  x 15 mins    Electrical Stimulation Goals  Tone;Pain      Ultrasound   Ultrasound Parameters  [place] at 1.5 w/cm2 x 12 mins    Ultrasound Goals  Pain      Manual Therapy   Manual Therapy  Soft tissue mobilization    Soft tissue mobilization  STW/M to bilateral UT's to decrease tone x 12 minutes.                  PT Long Term Goals - 10/23/17 1548      PT LONG TERM GOAL #1   Title  Patient will be independent with HEP    Period  Weeks    Status  Achieved      PT LONG TERM GOAL #2   Title  Patient will report centralization of  numbness and tingling symptoms to L elbow to indicate reduced nerve irritation.    Time  6    Period  Weeks    Status  Not Met   NM due to still with symptoms     PT LONG TERM GOAL #3   Title  Patient will demonstrate 8+ lbs of left grip strength to improve fine motor tasks.    Time  6    Period  Weeks    Status  Not Met   2-3# grip     PT LONG TERM GOAL #4   Title  Patient will demonstrate 50+ degrees of active cervical rotation ROM to scan environment.    Time  6    Period  Weeks    Status  Achieved            Plan - 10/23/17 1622    Clinical Impression Statement  Pt arrived today doing fair. She continues to have tightness/ pain across her Utraps and neck. She still had notable tightness along BIl Utraps and levator noted during STW with some decreased tautness after  session. Her LT hand continues to have numbness and her grip strength was somewhat absent  @ 2-3#'s. She was able to meet  LTG for cervical rotation at 50 degrees bilateral.  Other LTGs NM due to weakness and symptoms in LT UE    Clinical Presentation  Evolving    Rehab Potential  Fair    PT Frequency  1x / week    PT Duration  6 weeks    PT Treatment/Interventions  ADLs/Self Care Home Management;Cryotherapy;Electrical Stimulation;Gait training;Stair training;Functional mobility training;Therapeutic activities;Therapeutic exercise;Neuromuscular re-education;Patient/family education;Passive range of motion;Manual techniques    PT Next Visit Plan  To MD tomorrow    Consulted and Agree with Plan of Care  Patient       Patient will benefit from skilled therapeutic intervention in order to improve the following deficits and impairments:  Pain, Impaired UE functional use, Decreased strength, Decreased range of motion, Postural dysfunction  Visit Diagnosis: Radiculopathy, cervical region  Cervicalgia  Muscle weakness (generalized)     Problem List Patient Active Problem List  Diagnosis Date Noted  .  Tinnitus of both ears 10/19/2017  . Rhinorrhea 09/26/2017  . Chronic diarrhea 08/08/2017  . S/P left TKA 04/10/2017  . Unstable gait 01/12/2017  . Generalized osteoarthritis of hand 01/12/2017  . RLS (restless legs syndrome) 11/09/2016  . GERD (gastroesophageal reflux disease) 07/13/2016  . Class 2 severe obesity due to excess calories with serious comorbidity and body mass index (BMI) of 36.0 to 36.9 in adult ([place]) 06/01/2016  . Vitamin D deficiency 06/01/2016  . Vitamin B12 deficiency 03/27/2016  . Chronic cough 03/16/2016  . Left foot pain 03/16/2016  . Demand ischemia ([place]) 02/14/2015  . Degenerative drusen 07/13/2013  . Type 2 diabetes mellitus without ophthalmic manifestations ([place]) 07/13/2013  . DM (diabetes mellitus) type II controlled, neurological manifestation ([place]) 05/07/2012  . Paroxysmal atrial tachycardia ([place]) 05/07/2012  . Essential hypertension, benign 05/07/2012  . Depression 05/07/2012  . Hyperlipidemia 05/07/2012  . Fibromyalgia 05/07/2012    Lillyona Polasek,CHRIS , PTA 10/23/2017, 6:07 PM  [facility] [place], [place], [address] Phone: [phone]   Fax:  [phone]  Name: [name] MRN: [id number] Date of Birth: 05/09/1940  PHYSICAL THERAPY DISCHARGE SUMMARY  Visits from Start of Care: 6.  Current functional level related to goals / functional outcomes: See above.   Remaining deficits: See goal section.   Education / Equipment: HEP. Plan: Patient agrees to discharge.  Patient goals were partially met. Patient is being discharged due to being pleased with the current functional level.  ?????          Mali Applegate MPT

## 2017-10-24 DIAGNOSIS — Z6835 Body mass index (BMI) 35.0-35.9, adult: Secondary | ICD-10-CM | POA: Diagnosis not present

## 2017-10-24 DIAGNOSIS — M5412 Radiculopathy, cervical region: Secondary | ICD-10-CM | POA: Diagnosis not present

## 2017-10-25 ENCOUNTER — Telehealth: Payer: Self-pay | Admitting: Family Medicine

## 2017-10-25 ENCOUNTER — Encounter: Payer: PPO | Admitting: *Deleted

## 2017-10-25 DIAGNOSIS — H903 Sensorineural hearing loss, bilateral: Secondary | ICD-10-CM | POA: Diagnosis not present

## 2017-10-25 DIAGNOSIS — R42 Dizziness and giddiness: Secondary | ICD-10-CM | POA: Diagnosis not present

## 2017-10-25 DIAGNOSIS — H9313 Tinnitus, bilateral: Secondary | ICD-10-CM | POA: Diagnosis not present

## 2017-10-25 NOTE — Telephone Encounter (Signed)
Copied from [place] [phone]. Topic: Quick Communication - See Telephone Encounter >> Oct 25, 2017  8:54 AM Reyne Dumas L wrote: CRM for notification. See Telephone encounter for: 10/25/17.  Pt states that after being taken off of Metformin and put on the Insulin Glargine (BASAGLAR KWIKPEN) 100 UNIT/ML SOPN, pt states that her CBGs have continued to rise and today it is the highest it has been at 140+.  Pt states that she is concerned because on the Metformin her CBGs were around 100.  Pt states that she is also coming up on Neck Surgery and if her A1C is over 7 she will not be able to have it done. Pt wants to know what she should do.  Pt can be reached at [phone]

## 2017-10-26 ENCOUNTER — Other Ambulatory Visit: Payer: Self-pay | Admitting: Family Medicine

## 2017-10-26 DIAGNOSIS — F330 Major depressive disorder, recurrent, mild: Secondary | ICD-10-CM

## 2017-10-26 NOTE — Telephone Encounter (Signed)
Spoke with patient and gave instructions per Dr. [name] to increase insulin from 60 to 65 units. Patient verbalized understanding.

## 2017-10-26 NOTE — Telephone Encounter (Signed)
Message sent to Dr. Jordan for review. 

## 2017-10-26 NOTE — Telephone Encounter (Signed)
Glucose went up as expected after discontinuation of metformin.  140+ glucose are not very concerning, I am more worried about hypoglycemia from insulin. Last HgA1C was 6.7. If she is still concerned she can increase Basaglar from 60 units to 65 units.  Thanks, BJ

## 2017-11-02 ENCOUNTER — Other Ambulatory Visit: Payer: Self-pay | Admitting: Neurological Surgery

## 2017-11-02 ENCOUNTER — Encounter: Payer: Self-pay | Admitting: Gastroenterology

## 2017-11-02 ENCOUNTER — Ambulatory Visit (INDEPENDENT_AMBULATORY_CARE_PROVIDER_SITE_OTHER): Payer: PPO | Admitting: Gastroenterology

## 2017-11-02 VITALS — BP 144/82 | HR 79 | Ht 63.5 in | Wt 203.0 lb

## 2017-11-02 DIAGNOSIS — K529 Noninfective gastroenteritis and colitis, unspecified: Secondary | ICD-10-CM | POA: Diagnosis not present

## 2017-11-02 NOTE — Patient Instructions (Signed)
If you are age 75 or older, your body mass index should be between 23-30. Your Body mass index is 35.4 kg/m. If this is out of the aforementioned range listed, please consider follow up with your Primary Care Provider.  If you are age 30 or younger, your body mass index should be between 19-25. Your Body mass index is 35.4 kg/m. If this is out of the aformentioned range listed, please consider follow up with your Primary Care Provider.   It was a pleasure to see you today!  Dr. Loletha Carrow

## 2017-11-02 NOTE — Progress Notes (Addendum)
[facility] Consult Note:  History: [name] 11/02/2017  Referring physician: [name], [name], MD  Reason for consult/chief complaint: Diarrhea (Mostly resolved since stopping metformin. Pt has loose stools 2 days out of 7 days. Pt states she cannot eat raw veggies b/c she has urgent loose stools. )   Subjective  HPI:  This is a very pleasant 77 year old woman accompanied by her daughter, referred by primary care for chronic diarrhea.  The referral was sent about 2 months ago when [name] was having ongoing complaints of 4-6 loose stools per day with urgency including nocturnal diarrhea and some episodes of incontinence.  This is been going on for at least 5 years, and she tells me that she had been asking her endocrinologist if she could stop the metformin.  It was just stopped several weeks ago primary care, and there has since been considerable improvement in the diarrhea.  She now has some formed stools, and might have 1 or 2 loose bowel movements a day a couple of times a week.  Her quality of life is improved as a result.  She reports that for many years she cannot eat certain raw vegetables because we will give her loose stool with urgency.  [name] apparently saw Dr. Paulita Fujita at [facility] about 5 years ago and had an upper endoscopy and a colonoscopy.  Those records are not available today, but we asked her to sign a release so they can be obtained and reviewed.  She recalls being told "there is a nerve at the end of the jejunum that does not allow me to digest certain foods".  Also recalls that her bowel preparation may not have been adequate.  She really has a number of other ongoing complaints including a left sided pain that is been evaluated by primary care and was the cause of a CT scan of the abdomen in June.  She is still concerned because a cause is not yet been discovered.  She has chronic musculoskeletal pain from fibromyalgia and episodes of dizziness as  well as tinnitus. ROS:  Review of Systems  Constitutional: Positive for fatigue. Negative for appetite change and unexpected weight change.  HENT: Negative for mouth sores and voice change.   Eyes: Negative for pain and redness.  Respiratory: Negative for cough and shortness of breath.   Cardiovascular: Positive for palpitations. Negative for chest pain.  Genitourinary: Negative for dysuria and hematuria.  Musculoskeletal: Positive for arthralgias, back pain and myalgias.  Skin: Negative for pallor and rash.  Neurological: Positive for dizziness. Negative for weakness and headaches.       She has chronic tingling several fingers of the left hand.  Neurosurgery recently planned but insurance declined.  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  Hematological: Negative for adenopathy.  Psychiatric/Behavioral:       Anxiety     Past Medical History: Past Medical History:  Diagnosis Date  . Anemia    during 1st pregnancy  . Anxiety   . Arthritis   . Complication of anesthesia   . Depression   . Diarrhea, functional   . Dysrhythmia    Paroysmal Atrial Tachycardia  . Essential hypertension   . Fibromyalgia   . GERD (gastroesophageal reflux disease)   . H/O acute pancreatitis   . History of hiatal hernia   . Hyperlipemia   . Pneumonia   . PONV (postoperative nausea and vomiting)   . PSVT (paroxysmal supraventricular tachycardia) ([place])   . Reflux   .  Restless leg syndrome   . Type 2 diabetes mellitus ([place])      Past Surgical History: Past Surgical History:  Procedure Laterality Date  . ABDOMINAL HYSTERECTOMY    . ANKLE SURGERY    . BACK SURGERY     T12-L1 fusion, Anterior Cervical fusion  . CARDIAC CATHETERIZATION N/A 02/16/2015   Procedure: Left Heart Cath and Coronary Angiography;  Surgeon: Sherren Mocha, MD;  Location: [facility];  Service: Cardiovascular;  Laterality: N/A;  . CHOLECYSTECTOMY    . COLONOSCOPY    . ESOPHAGOGASTRODUODENOSCOPY  12/21/2010     Procedure: ESOPHAGOGASTRODUODENOSCOPY (EGD);  Surgeon: Landry Dyke, MD;  Location: [facility];  Service: Endoscopy;  Laterality: N/A;  . EXCISION MORTON'S NEUROMA    . FRACTURE SURGERY    . KNEE SURGERY     x 3  . NECK SURGERY    . ROTATOR CUFF REPAIR    . TONSILLECTOMY    . TOTAL KNEE ARTHROPLASTY Left 04/10/2017   Procedure: LEFT TOTAL KNEE ARTHROPLASTY;  Surgeon: Paralee Cancel, MD;  Location: [facility];  Service: Orthopedics;  Laterality: Left;  70 mins  . UPPER GASTROINTESTINAL ENDOSCOPY    . WRIST SURGERY       Family History: Family History  Problem Relation Age of Onset  . Heart failure Mother   . Hyperlipidemia Mother   . Lung cancer Father        Lymphoma  . Hyperlipidemia Maternal Grandmother   . Peripheral vascular disease Maternal Grandmother     Social History: Social History   Socioeconomic History  . Marital status: Widowed    Spouse name: Glendell Docker  . Number of children: 2  . Years of education: Not on file  . Highest education level: Not on file  Occupational History  . Not on file  Social Needs  . Financial resource strain: Not on file  . Food insecurity:    Worry: Not on file    Inability: Not on file  . Transportation needs:    Medical: Not on file    Non-medical: Not on file  Tobacco Use  . Smoking status: Never Smoker  . Smokeless tobacco: Never Used  Substance and Sexual Activity  . Alcohol use: No  . Drug use: No  . Sexual activity: Not on file  Lifestyle  . Physical activity:    Days per week: Not on file    Minutes per session: Not on file  . Stress: Not on file  Relationships  . Social connections:    Talks on phone: Not on file    Gets together: Not on file    Attends religious service: Not on file    Active member of club or organization: Not on file    Attends meetings of clubs or organizations: Not on file    Relationship status: Not on file  Other Topics Concern  . Not on file  Social History Narrative   Lives with  husband.  Independent of ADLs.    Allergies: Allergies  Allergen Reactions  . Amoxil [Amoxicillin] Swelling and Other (See Comments)    Lips Has patient had a PCN reaction causing immediate rash, facial/tongue/throat swelling, SOB or lightheadedness with hypotension:YES Has patient had a PCN reaction causing severe rash involving mucus membranes or skin necrosis:No Has patient had a PCN reaction that required hospitalization:No Has patient had a PCN reaction occurring within the last 10 years:Yes. If all of the above answers are "NO", then may proceed with Cephalosporin use.   Marland Kitchen  Chocolate Hives  . Demerol Nausea And Vomiting  . Lisinopril Itching and Swelling  . Meperidine   . Meperidine Hcl     Outpatient Meds: Current Outpatient Medications  Medication Sig Dispense Refill  . acetaminophen (TYLENOL) 325 MG tablet Take 325 mg by mouth daily as needed (for pain.).    Marland Kitchen aspirin EC 81 MG tablet Take 81 mg by mouth every evening.    . Calcium Carb-Cholecalciferol (CALCIUM 600+D3 PO) Take 1 tablet by mouth every evening.    . cholecalciferol (VITAMIN D) 1000 units tablet Take 1,000 Units by mouth daily.    . Cranberry 1000 MG CAPS Take 1 capsule by mouth 2 (two) times daily. + Vit C 40 mg    . digoxin (LANOXIN) 0.125 MG tablet Take 0.125 mg by mouth daily.    Marland Kitchen ezetimibe (ZETIA) 10 MG tablet Take 1 tablet (10 mg total) by mouth daily. (Patient taking differently: Take 10 mg by mouth every evening. ) 90 tablet 2  . Insulin Glargine (BASAGLAR KWIKPEN) 100 UNIT/ML SOPN Inject 0.6 mLs (60 Units total) into the skin daily. (Patient taking differently: Inject 65 Units into the skin at bedtime. ) 3 pen 1  . ipratropium (ATROVENT) 0.06 % nasal spray Place 2 sprays into both nostrils 4 (four) times daily. 15 mL 4  . meclizine (ANTIVERT) 25 MG tablet Take 1 tablet (25 mg total) by mouth 2 (two) times daily as needed for up to 15 days for dizziness. 45 tablet 0  . methocarbamol (ROBAXIN) 500 MG  tablet Take 1 tablet (500 mg total) by mouth every 6 (six) hours as needed for muscle spasms. 40 tablet 0  . metoprolol succinate (TOPROL-XL) 25 MG 24 hr tablet Take 1 tablet (25 mg total) by mouth daily. 90 tablet 1  . Multiple Vitamins-Minerals (PRESERVISION AREDS 2 PO) Take 1 capsule by mouth 2 (two) times daily.    . nitroGLYCERIN (NITROSTAT) 0.4 MG SL tablet Place 0.4 mg under the tongue every 5 (five) minutes as needed. As needed chest pain    . omeprazole (PRILOSEC) 20 MG capsule Take 1 capsule (20 mg total) by mouth daily. 90 capsule 2  . ramipril (ALTACE) 10 MG capsule TAKE 1 CAPSULE BY MOUTH EVERY DAY. (Patient taking differently: Take 10 mg by mouth every evening. ) 90 capsule 0  . RELION INSULIN SYRINGE 1ML/31G 31G X 5/16" 1 ML MISC Use as directed 100 each 1  . rOPINIRole (REQUIP) 1 MG tablet Take 1 tablet (1 mg total) by mouth at bedtime. 90 tablet 1  . sertraline (ZOLOFT) 100 MG tablet Take 1 tablet (100 mg total) by mouth every evening. 30 tablet 0  . verapamil (CALAN) 120 MG tablet Take 120 mg by mouth as needed (for heart).    . verapamil (VERELAN PM) 240 MG 24 hr capsule Take 1 capsule (240 mg total) by mouth daily. 30 capsule 2   No current facility-administered medications for this visit.       ___________________________________________________________________ Objective   Exam:  BP (!) 144/82   Pulse 79   Ht 5' 3.5" (1.613 m)   Wt 203 lb (92.1 kg)   BMI 35.40 kg/m    General: this is a(n) elderly woman, no acute distress.  Antalgic gait, gets on and off exam table with a little assistance.  She gets dizzy when sitting up.  Eyes: sclera anicteric, no redness  ENT: oral mucosa moist without lesions, no cervical or supraclavicular lymphadenopathy, good dentition  CV: RRR without murmur,  S1/S2, no JVD, no peripheral edema  Resp: clear to auscultation bilaterally, normal RR and effort noted  GI: soft, tenderness left-sided iliac crest.  No abdominal  tenderness,  active bowel sounds of normal character. No guarding or palpable organomegaly noted.  Skin; warm and dry, no rash or jaundice noted  Neuro: awake, alert and oriented x 3. Normal gross motor function and fluent speech  Labs:  CBC Latest Ref Rng & Units 09/21/2017 09/11/2017 09/04/2017  WBC 4.0 - 10.5 K/uL 8.2 8.3 9.7  Hemoglobin 12.0 - 15.0 [name]/dL 13.7 13.3 13.4  Hematocrit 36.0 - 46.0 % 41.4 38.3 42.0  Platelets 150 - 400 K/uL 196 204.0 213   CMP Latest Ref Rng & Units 10/19/2017 09/21/2017 09/11/2017  Glucose 65 - 99 mg/dL 150(H) 197(H) 98  BUN 7 - 25 mg/dL 19 27(H) 14  Creatinine 0.60 - 0.93 mg/dL 0.96(H) 1.08(H) 0.87  Sodium 135 - 146 mmol/L 135 138 138  Potassium 3.5 - 5.3 mmol/L 4.1 4.1 4.5  Chloride 98 - 110 mmol/L 104 104 105  CO2 20 - 32 mmol/L 23 20(L) 25  Calcium 8.6 - 10.4 mg/dL 10.0 9.4 9.8  Total Protein 6.0 - 8.3 [name]/dL - - -  Total Bilirubin 0.2 - 1.2 mg/dL - - -  Alkaline Phos 39 - 117 U/L - - -  AST 0 - 37 U/L - - -  ALT 0 - 35 U/L - - -     Radiologic Studies:  CTAP 08/10/17:  Neg for GI pathology  Assessment: Encounter Diagnosis  Name Primary?  . Chronic diarrhea Yes    This appears to have been largely related to metformin since it is significantly improved lately.  I doubt microscopic colitis, and we will get prior records to see if any biopsies were taken at that time.  There may be a functional element as well.  I am not currently planning further testing.  Based on prior records we can determine whether or not she is due for any routine colonoscopy.  If so, it really should wait until improvement in her neurologic symptoms.   Thank you for the courtesy of this consult.  Please call me with any questions or concerns.  Nelida Meuse III  CC: [name], [name], MD   Addendum for record review, 11/14/17:  Office visit with Dr. Paulita Fujita 08/21/2013, recommended treatment with loperamide. EGD 12/21/2010 for nausea vomiting dysphagia small hiatal hernia,  mild gastric erythema, otherwise normal EGD.Marland Kitchen  Stomach biopsies negative for H. pylori.  Barium swallow 07/09/2013 prominent cricopharyngeus muscle, no reflux seen.  No delay of barium tablet passage.  Colonoscopy 12/21/2010 with reported internal and external hemorrhoids are rare sigmoid diverticuli, otherwise normal colonoscopy.  However, fair bowel preparation noted.  Recommendation was for repeat colonoscopy in 5 years.  No other procedure reports accompanied these records.  Madelon Lips, MD

## 2017-11-08 NOTE — Pre-Procedure Instructions (Signed)
[name]  11/08/2017    Your procedure is scheduled on Monday, September 30.  Report to [facility] Admitting at 10:30 AM                    Your surgery or procedure is scheduled for  1:00 PM     Call this number if you have problems the morning of surgery: [phone]  This is the number for the Pre- Surgical Desk.    Remember:  Do not eat or drink after midnight Sunday, September 29.   Take these medicines the morning of surgery with A SIP OF WATER : digoxin (LANOXIN)  meclizine (ANTIVERT)   metoprolol succinate (TOPROL-XL) verapamil (VERELAN PM)  Take if needed: verapamil (CALAN) nitroGLYCERIN (NITROSTAT)  methocarbamol (ROBAXIN) acetaminophen (TYLENOL)   Continue or Hold Aspirin as directed by Dr [name]    STOP taking Aspirin , Aspirin Products (Goody Powder, Excedrin Migraine), Ibuprofen (Advil), Naproxen (Aleve), Vitamins and Herbal Products (ie Fish Oil)  Special instructions:   [facility]- Preparing For Surgery  Before surgery, you can play an important role. Because skin is not sterile, your skin needs to be as free of germs as possible. You can reduce the number of germs on your skin by washing with CHG (chlorahexidine gluconate) Soap before surgery.  CHG is an antiseptic cleaner which kills germs and bonds with the skin to continue killing germs even after washing.    Oral Hygiene is also important to reduce your risk of infection.  Remember - BRUSH YOUR TEETH THE MORNING OF SURGERY WITH YOUR REGULAR TOOTHPASTE  Please do not use if you have an allergy to CHG or antibacterial soaps. If your skin becomes reddened/irritated stop using the CHG.  Do not shave (including legs and underarms) for at least 48 hours prior to first CHG shower. It is OK to shave your face.  Please follow these instructions carefully.   1. Shower the NIGHT BEFORE SURGERY and the MORNING OF SURGERY with CHG.   2. If you chose to wash your hair, wash your hair first as  usual with your normal shampoo.  3. After you shampoo, Wash your face and private area with the soap you use at home, then rinse. rinse your hair and body thoroughly to remove the shampoo.  4. Use CHG as you would any other liquid soap. You can apply CHG directly to the skin and wash gently with a scrungie or a clean washcloth.   5. Apply the CHG Soap to your body ONLY FROM THE NECK DOWN.  Do not use on open wounds or open sores. Avoid contact with your eyes, ears, mouth and genitals (private parts).   6. Wash thoroughly, paying special attention to the area where your surgery will be performed.  7. Thoroughly rinse your body with warm water from the neck down.  8. DO NOT shower/wash with your normal soap after using and rinsing off the CHG Soap.  9. Pat yourself dry with a CLEAN TOWEL.  10. Wear CLEAN PAJAMAS to bed the night before surgery, wear comfortable clothes the morning of surgery  11. Place CLEAN SHEETS on your bed the night of your first shower and DO NOT SLEEP WITH PETS.   Day of Surgery:  Shower as above  Do not apply any deodorants/lotions, powders or cologne..  Please wear clean clothes to the hospital/surgery center.   Remember to brush your teeth WITH YOUR REGULAR TOOTHPASTE.  Do not wear jewelry, make-up or nail polish..  Do not shave 48 hours prior to surgery.    Do not bring valuables to the hospital.  [facility] is not responsible for any belongings or valuables.  Contacts, dentures or bridgework may not be worn into surgery.  Leave your suitcase in the car.  After surgery it may be brought to your room.

## 2017-11-08 NOTE — Progress Notes (Signed)
   How to Manage Your Diabetes Before and After Surgery  WHAT DO I DO ABOUT MY DIABETES MEDICATION?  . THE NIGHT BEFORE SURGERY, take 32 units of Basaglar Insulin  Why is it important to control my blood sugar before and after surgery? . Improving blood sugar levels before and after surgery helps healing and can limit problems. . A way of improving blood sugar control is eating a healthy diet by: o  Eating less sugar and carbohydrates o  Increasing activity/exercise o  Talking with your doctor about reaching your blood sugar goals . High blood sugars (greater than 180 mg/dL) can raise your risk of infections and slow your recovery, so you will need to focus on controlling your diabetes during the weeks before surgery. . Make sure that the doctor who takes care of your diabetes knows about your planned surgery including the date and location.  How do I manage my blood sugar before surgery? . Check your blood sugar at least 4 times a day, starting 2 days before surgery, to make sure that the level is not too high or low. o Check your blood sugar the morning of your surgery when you wake up and every 2 hours until you get to the Short Stay unit. . If your blood sugar is less than 70 mg/dL, you will need to treat for low blood sugar: o Do not take insulin. o Treat a low blood sugar (less than 70 mg/dL) with  cup of clear juice (cranberry or apple), 4 glucose tablets, OR glucose gel. Recheck blood sugar in 15 minutes after treatment (to make sure it is greater than 70 mg/dL). If your blood sugar is not greater than 70 mg/dL on recheck, call [phone] o  for further instructions. . Report your blood sugar to the short stay nurse when you get to Short Stay.  . If you are admitted to the hospital after surgery: o Your blood sugar will be checked by the staff and you will probably be given insulin after surgery (instead of oral diabetes medicines) to make sure you have good blood sugar  levels. o The goal for blood sugar control after surgery is 80-180 mg/dL.

## 2017-11-09 ENCOUNTER — Encounter (HOSPITAL_COMMUNITY): Payer: Self-pay | Admitting: *Deleted

## 2017-11-09 ENCOUNTER — Other Ambulatory Visit: Payer: Self-pay

## 2017-11-10 ENCOUNTER — Other Ambulatory Visit: Payer: Self-pay | Admitting: Family Medicine

## 2017-11-12 ENCOUNTER — Observation Stay (HOSPITAL_COMMUNITY)
Admission: RE | Admit: 2017-11-12 | Discharge: 2017-11-13 | Disposition: A | Discharge status: Home / Self Care | Payer: PPO | Source: Ambulatory Visit | Attending: Neurological Surgery | Admitting: Neurological Surgery

## 2017-11-12 ENCOUNTER — Ambulatory Visit (HOSPITAL_COMMUNITY): Payer: PPO

## 2017-11-12 ENCOUNTER — Other Ambulatory Visit: Payer: Self-pay

## 2017-11-12 ENCOUNTER — Ambulatory Visit (HOSPITAL_COMMUNITY): Payer: PPO | Admitting: Certified Registered Nurse Anesthetist

## 2017-11-12 ENCOUNTER — Encounter (HOSPITAL_COMMUNITY)
Admission: RE | Disposition: A | Discharge status: Home / Self Care | Payer: Self-pay | Source: Ambulatory Visit | Attending: Neurological Surgery

## 2017-11-12 ENCOUNTER — Encounter (HOSPITAL_COMMUNITY): Payer: Self-pay

## 2017-11-12 DIAGNOSIS — R200 Anesthesia of skin: Secondary | ICD-10-CM | POA: Diagnosis present

## 2017-11-12 DIAGNOSIS — M4712 Other spondylosis with myelopathy, cervical region: Secondary | ICD-10-CM | POA: Diagnosis not present

## 2017-11-12 DIAGNOSIS — E785 Hyperlipidemia, unspecified: Secondary | ICD-10-CM | POA: Diagnosis not present

## 2017-11-12 DIAGNOSIS — Z794 Long term (current) use of insulin: Secondary | ICD-10-CM | POA: Diagnosis not present

## 2017-11-12 DIAGNOSIS — E119 Type 2 diabetes mellitus without complications: Secondary | ICD-10-CM | POA: Diagnosis not present

## 2017-11-12 DIAGNOSIS — Z419 Encounter for procedure for purposes other than remedying health state, unspecified: Secondary | ICD-10-CM

## 2017-11-12 DIAGNOSIS — Z981 Arthrodesis status: Secondary | ICD-10-CM | POA: Diagnosis not present

## 2017-11-12 DIAGNOSIS — Z96652 Presence of left artificial knee joint: Secondary | ICD-10-CM | POA: Diagnosis not present

## 2017-11-12 DIAGNOSIS — M4722 Other spondylosis with radiculopathy, cervical region: Secondary | ICD-10-CM | POA: Diagnosis not present

## 2017-11-12 DIAGNOSIS — I10 Essential (primary) hypertension: Secondary | ICD-10-CM | POA: Diagnosis not present

## 2017-11-12 DIAGNOSIS — M4723 Other spondylosis with radiculopathy, cervicothoracic region: Secondary | ICD-10-CM | POA: Diagnosis not present

## 2017-11-12 DIAGNOSIS — K219 Gastro-esophageal reflux disease without esophagitis: Secondary | ICD-10-CM | POA: Diagnosis not present

## 2017-11-12 DIAGNOSIS — M4713 Other spondylosis with myelopathy, cervicothoracic region: Secondary | ICD-10-CM | POA: Diagnosis not present

## 2017-11-12 DIAGNOSIS — M4322 Fusion of spine, cervical region: Secondary | ICD-10-CM | POA: Diagnosis not present

## 2017-11-12 HISTORY — DX: Headache: R51

## 2017-11-12 HISTORY — PX: ANTERIOR CERVICAL DECOMP/DISCECTOMY FUSION: SHX1161

## 2017-11-12 HISTORY — DX: Headache, unspecified: R51.9

## 2017-11-12 LAB — CBC
HCT: 42.2 % (ref 36.0–46.0)
Hemoglobin: 14 g/dL (ref 12.0–15.0)
MCH: 29.6 pg (ref 26.0–34.0)
MCHC: 33.2 g/dL (ref 30.0–36.0)
MCV: 89.2 fL (ref 78.0–100.0)
Platelets: 181 10*3/uL (ref 150–400)
RBC: 4.73 MIL/uL (ref 3.87–5.11)
RDW: 13 % (ref 11.5–15.5)
WBC: 8.3 10*3/uL (ref 4.0–10.5)

## 2017-11-12 LAB — BASIC METABOLIC PANEL
Anion gap: 9 (ref 5–15)
BUN: 16 mg/dL (ref 8–23)
CO2: 21 mmol/L — ABNORMAL LOW (ref 22–32)
Calcium: 9.2 mg/dL (ref 8.9–10.3)
Chloride: 107 mmol/L (ref 98–111)
Creatinine, Ser: 0.94 mg/dL (ref 0.44–1.00)
GFR calc Af Amer: >60 mL/min (ref >60)
GFR calc non Af Amer: 58 mL/min — ABNORMAL LOW (ref >60)
Glucose, Bld: 137 mg/dL — ABNORMAL HIGH (ref 70–99)
Potassium: 4.3 mmol/L (ref 3.5–5.1)
Sodium: 137 mmol/L (ref 135–145)

## 2017-11-12 LAB — GLUCOSE, CAPILLARY
Glucose-Capillary: 138 mg/dL — ABNORMAL HIGH (ref 70–99)
Glucose-Capillary: 125 mg/dL — ABNORMAL HIGH (ref 70–99)
Glucose-Capillary: 210 mg/dL — ABNORMAL HIGH (ref 70–99)
Glucose-Capillary: 274 mg/dL — ABNORMAL HIGH (ref 70–99)

## 2017-11-12 SURGERY — ANTERIOR CERVICAL DECOMPRESSION/DISCECTOMY FUSION 1 LEVEL/HARDWARE REMOVAL
Anesthesia: General

## 2017-11-12 MED ORDER — BASAGLAR KWIKPEN 100 UNIT/ML ~~LOC~~ SOPN: 65.0000 [IU] | PEN_INJECTOR | Freq: Every day | SUBCUTANEOUS | Status: DC

## 2017-11-12 MED ORDER — ONDANSETRON HCL 4 MG/2ML IJ SOLN: 4.0000 mg | Freq: Four times a day (QID) | INTRAMUSCULAR | Status: DC | PRN

## 2017-11-12 MED ORDER — HYDROCODONE-ACETAMINOPHEN 5-325 MG PO TABS
1.0000 | ORAL_TABLET | ORAL | Status: DC | PRN
  Administered 2017-11-12 – 2017-11-13 (×2): 2 via ORAL
  Administered 2017-11-13: 1 via ORAL
  Filled 2017-11-12: qty 1
  Filled 2017-11-12 (×2): qty 2

## 2017-11-12 MED ORDER — RAMIPRIL 10 MG PO CAPS
10.0000 mg | ORAL_CAPSULE | Freq: Every evening | ORAL | Status: DC
  Administered 2017-11-12: 10 mg via ORAL
  Filled 2017-11-12 (×2): qty 1

## 2017-11-12 MED ORDER — LIDOCAINE-EPINEPHRINE 1 %-1:100000 IJ SOLN
INTRAMUSCULAR | Status: DC | PRN
  Administered 2017-11-12: 7 mL

## 2017-11-12 MED ORDER — EZETIMIBE 10 MG PO TABS
10.0000 mg | ORAL_TABLET | Freq: Every evening | ORAL | Status: DC
  Administered 2017-11-12: 10 mg via ORAL
  Filled 2017-11-12 (×3): qty 1

## 2017-11-12 MED ORDER — PROPOFOL 500 MG/50ML IV EMUL
INTRAVENOUS | Status: DC | PRN
  Administered 2017-11-12: 20 ug/kg/min via INTRAVENOUS

## 2017-11-12 MED ORDER — ONDANSETRON HCL 4 MG PO TABS: 4.0000 mg | ORAL_TABLET | Freq: Four times a day (QID) | ORAL | Status: DC | PRN

## 2017-11-12 MED ORDER — ONDANSETRON HCL 4 MG/2ML IJ SOLN
INTRAMUSCULAR | Status: DC | PRN
  Administered 2017-11-12: 4 mg via INTRAVENOUS

## 2017-11-12 MED ORDER — SODIUM CHLORIDE 0.9% FLUSH: 3.0000 mL | INTRAVENOUS | Status: DC | PRN

## 2017-11-12 MED ORDER — FENTANYL CITRATE (PF) 100 MCG/2ML IJ SOLN
INTRAMUSCULAR | Status: DC | PRN
  Administered 2017-11-12 (×4): 50 ug via INTRAVENOUS

## 2017-11-12 MED ORDER — INSULIN ASPART 100 UNIT/ML ~~LOC~~ SOLN
0.0000 [IU] | Freq: Every day | SUBCUTANEOUS | Status: DC
  Administered 2017-11-12: 3 [IU] via SUBCUTANEOUS

## 2017-11-12 MED ORDER — DEXAMETHASONE SODIUM PHOSPHATE 10 MG/ML IJ SOLN
INTRAMUSCULAR | Status: AC
  Filled 2017-11-12: qty 1

## 2017-11-12 MED ORDER — DIGOXIN 125 MCG PO TABS
0.1250 mg | ORAL_TABLET | Freq: Every day | ORAL | Status: DC
  Administered 2017-11-13: 0.125 mg via ORAL
  Filled 2017-11-12: qty 1

## 2017-11-12 MED ORDER — PROSIGHT PO TABS
1.0000 | ORAL_TABLET | Freq: Two times a day (BID) | ORAL | Status: DC
  Administered 2017-11-12 – 2017-11-13 (×3): 1 via ORAL
  Filled 2017-11-12 (×3): qty 1

## 2017-11-12 MED ORDER — ACETAMINOPHEN 650 MG RE SUPP: 650.0000 mg | RECTAL | Status: DC | PRN

## 2017-11-12 MED ORDER — 0.9 % SODIUM CHLORIDE (POUR BTL) OPTIME
TOPICAL | Status: DC | PRN
  Administered 2017-11-12: 1000 mL

## 2017-11-12 MED ORDER — SUGAMMADEX SODIUM 200 MG/2ML IV SOLN
INTRAVENOUS | Status: DC | PRN
  Administered 2017-11-12: 190 mg via INTRAVENOUS

## 2017-11-12 MED ORDER — PROPOFOL 10 MG/ML IV BOLUS
INTRAVENOUS | Status: DC | PRN
  Administered 2017-11-12: 150 mg via INTRAVENOUS

## 2017-11-12 MED ORDER — SODIUM CHLORIDE 0.9 % IV SOLN
INTRAVENOUS | Status: DC | PRN
  Administered 2017-11-12: 11:00:00

## 2017-11-12 MED ORDER — INSULIN ASPART 100 UNIT/ML ~~LOC~~ SOLN
0.0000 [IU] | Freq: Three times a day (TID) | SUBCUTANEOUS | Status: DC
  Administered 2017-11-12: 5 [IU] via SUBCUTANEOUS
  Administered 2017-11-13: 3 [IU] via SUBCUTANEOUS

## 2017-11-12 MED ORDER — METHOCARBAMOL 500 MG PO TABS
500.0000 mg | ORAL_TABLET | Freq: Four times a day (QID) | ORAL | Status: DC | PRN
  Administered 2017-11-12 – 2017-11-13 (×3): 500 mg via ORAL
  Filled 2017-11-12 (×2): qty 1

## 2017-11-12 MED ORDER — HYDROMORPHONE HCL 1 MG/ML IJ SOLN: 0.2500 mg | INTRAMUSCULAR | Status: DC | PRN

## 2017-11-12 MED ORDER — NITROGLYCERIN 0.4 MG SL SUBL: 0.4000 mg | SUBLINGUAL_TABLET | SUBLINGUAL | Status: DC | PRN

## 2017-11-12 MED ORDER — ROCURONIUM BROMIDE 50 MG/5ML IV SOSY
PREFILLED_SYRINGE | INTRAVENOUS | Status: AC
  Filled 2017-11-12: qty 10

## 2017-11-12 MED ORDER — EPHEDRINE SULFATE 50 MG/ML IJ SOLN
INTRAMUSCULAR | Status: DC | PRN
  Administered 2017-11-12 (×2): 10 mg via INTRAVENOUS

## 2017-11-12 MED ORDER — LIDOCAINE HCL (CARDIAC) PF 100 MG/5ML IV SOSY
PREFILLED_SYRINGE | INTRAVENOUS | Status: DC | PRN
  Administered 2017-11-12: 100 mg via INTRAVENOUS

## 2017-11-12 MED ORDER — MORPHINE SULFATE (PF) 2 MG/ML IV SOLN: 2.0000 mg | INTRAVENOUS | Status: DC | PRN

## 2017-11-12 MED ORDER — CHLORHEXIDINE GLUCONATE CLOTH 2 % EX PADS: 6.0000 | MEDICATED_PAD | Freq: Once | CUTANEOUS | Status: DC

## 2017-11-12 MED ORDER — ALUM & MAG HYDROXIDE-SIMETH 200-200-20 MG/5ML PO SUSP: 30.0000 mL | Freq: Four times a day (QID) | ORAL | Status: DC | PRN

## 2017-11-12 MED ORDER — FLEET ENEMA 7-19 GM/118ML RE ENEM: 1.0000 | ENEMA | Freq: Once | RECTAL | Status: DC | PRN

## 2017-11-12 MED ORDER — FENTANYL CITRATE (PF) 250 MCG/5ML IJ SOLN
INTRAMUSCULAR | Status: AC
  Filled 2017-11-12: qty 5

## 2017-11-12 MED ORDER — LIDOCAINE 2% (20 MG/ML) 5 ML SYRINGE
INTRAMUSCULAR | Status: AC
  Filled 2017-11-12: qty 5

## 2017-11-12 MED ORDER — METOPROLOL SUCCINATE ER 25 MG PO TB24
25.0000 mg | ORAL_TABLET | Freq: Every day | ORAL | Status: DC
  Administered 2017-11-13: 25 mg via ORAL
  Filled 2017-11-12: qty 1

## 2017-11-12 MED ORDER — PRESERVISION AREDS 2 PO CAPS: ORAL_CAPSULE | Freq: Two times a day (BID) | ORAL | Status: DC

## 2017-11-12 MED ORDER — EPHEDRINE 5 MG/ML INJ
INTRAVENOUS | Status: AC
  Filled 2017-11-12: qty 10

## 2017-11-12 MED ORDER — SERTRALINE HCL 50 MG PO TABS
50.0000 mg | ORAL_TABLET | Freq: Every evening | ORAL | Status: DC
  Administered 2017-11-12: 50 mg via ORAL
  Filled 2017-11-12: qty 1

## 2017-11-12 MED ORDER — ROPINIROLE HCL 1 MG PO TABS
1.0000 mg | ORAL_TABLET | Freq: Every day | ORAL | Status: DC
  Administered 2017-11-12: 1 mg via ORAL
  Filled 2017-11-12: qty 1

## 2017-11-12 MED ORDER — METHOCARBAMOL 1000 MG/10ML IJ SOLN
500.0000 mg | Freq: Four times a day (QID) | INTRAVENOUS | Status: DC | PRN
  Filled 2017-11-12: qty 5

## 2017-11-12 MED ORDER — POLYETHYLENE GLYCOL 3350 17 G PO PACK: 17.0000 g | PACK | Freq: Every day | ORAL | Status: DC | PRN

## 2017-11-12 MED ORDER — BUPIVACAINE HCL (PF) 0.5 % IJ SOLN
INTRAMUSCULAR | Status: DC | PRN
  Administered 2017-11-12: 7 mL

## 2017-11-12 MED ORDER — ROCURONIUM BROMIDE 100 MG/10ML IV SOLN
INTRAVENOUS | Status: DC | PRN
  Administered 2017-11-12: 20 mg via INTRAVENOUS
  Administered 2017-11-12: 50 mg via INTRAVENOUS

## 2017-11-12 MED ORDER — HEMOSTATIC AGENTS (NO CHARGE) OPTIME
TOPICAL | Status: DC | PRN
  Administered 2017-11-12: 1 via TOPICAL

## 2017-11-12 MED ORDER — PROMETHAZINE HCL 25 MG/ML IJ SOLN: 6.2500 mg | INTRAMUSCULAR | Status: DC | PRN

## 2017-11-12 MED ORDER — SODIUM CHLORIDE 0.9% FLUSH
3.0000 mL | Freq: Two times a day (BID) | INTRAVENOUS | Status: DC
  Administered 2017-11-12 (×2): 3 mL via INTRAVENOUS

## 2017-11-12 MED ORDER — PROPOFOL 10 MG/ML IV BOLUS
INTRAVENOUS | Status: AC
  Filled 2017-11-12: qty 20

## 2017-11-12 MED ORDER — MIDAZOLAM HCL 2 MG/2ML IJ SOLN
INTRAMUSCULAR | Status: AC
  Filled 2017-11-12: qty 2

## 2017-11-12 MED ORDER — MIDAZOLAM HCL 5 MG/5ML IJ SOLN
INTRAMUSCULAR | Status: DC | PRN
  Administered 2017-11-12: 2 mg via INTRAVENOUS

## 2017-11-12 MED ORDER — PHENOL 1.4 % MT LIQD: 1.0000 | OROMUCOSAL | Status: DC | PRN

## 2017-11-12 MED ORDER — ACETAMINOPHEN 325 MG PO TABS: 650.0000 mg | ORAL_TABLET | Freq: Two times a day (BID) | ORAL | Status: DC | PRN

## 2017-11-12 MED ORDER — INSULIN GLARGINE 100 UNIT/ML ~~LOC~~ SOLN
65.0000 [IU] | Freq: Every day | SUBCUTANEOUS | Status: DC
  Administered 2017-11-12: 65 [IU] via SUBCUTANEOUS
  Filled 2017-11-12: qty 0.65

## 2017-11-12 MED ORDER — SENNA 8.6 MG PO TABS
1.0000 | ORAL_TABLET | Freq: Two times a day (BID) | ORAL | Status: DC
  Administered 2017-11-12: 8.6 mg via ORAL
  Filled 2017-11-12 (×2): qty 1

## 2017-11-12 MED ORDER — SODIUM CHLORIDE 0.9 % IV SOLN
INTRAVENOUS | Status: DC | PRN
  Administered 2017-11-12: 20 ug/min via INTRAVENOUS

## 2017-11-12 MED ORDER — DOCUSATE SODIUM 100 MG PO CAPS
100.0000 mg | ORAL_CAPSULE | Freq: Two times a day (BID) | ORAL | Status: DC
  Administered 2017-11-12: 100 mg via ORAL
  Filled 2017-11-12 (×2): qty 1

## 2017-11-12 MED ORDER — ONDANSETRON HCL 4 MG/2ML IJ SOLN
INTRAMUSCULAR | Status: AC
  Filled 2017-11-12: qty 2

## 2017-11-12 MED ORDER — LACTATED RINGERS IV SOLN
INTRAVENOUS | Status: DC
  Administered 2017-11-12 (×3): via INTRAVENOUS

## 2017-11-12 MED ORDER — PANTOPRAZOLE SODIUM 40 MG PO TBEC
80.0000 mg | DELAYED_RELEASE_TABLET | Freq: Every day | ORAL | Status: DC
  Administered 2017-11-12 – 2017-11-13 (×2): 80 mg via ORAL
  Filled 2017-11-12 (×2): qty 2

## 2017-11-12 MED ORDER — DEXAMETHASONE SODIUM PHOSPHATE 10 MG/ML IJ SOLN
INTRAMUSCULAR | Status: DC | PRN
  Administered 2017-11-12: 10 mg via INTRAVENOUS

## 2017-11-12 MED ORDER — METHOCARBAMOL 500 MG PO TABS
500.0000 mg | ORAL_TABLET | Freq: Every day | ORAL | Status: DC
  Administered 2017-11-12: 500 mg via ORAL
  Filled 2017-11-12 (×2): qty 1

## 2017-11-12 MED ORDER — MECLIZINE HCL 25 MG PO TABS
25.0000 mg | ORAL_TABLET | Freq: Two times a day (BID) | ORAL | Status: DC
  Administered 2017-11-12 – 2017-11-13 (×3): 25 mg via ORAL
  Filled 2017-11-12 (×3): qty 1

## 2017-11-12 MED ORDER — SODIUM CHLORIDE 0.9 % IV SOLN: 250.0000 mL | INTRAVENOUS | Status: DC

## 2017-11-12 MED ORDER — VERAPAMIL HCL 120 MG PO TABS
120.0000 mg | ORAL_TABLET | Freq: Every day | ORAL | Status: DC | PRN
  Filled 2017-11-12: qty 1

## 2017-11-12 MED ORDER — MENTHOL 3 MG MT LOZG: 1.0000 | LOZENGE | OROMUCOSAL | Status: DC | PRN

## 2017-11-12 MED ORDER — ACETAMINOPHEN 325 MG PO TABS: 650.0000 mg | ORAL_TABLET | ORAL | Status: DC | PRN

## 2017-11-12 MED ORDER — BISACODYL 10 MG RE SUPP: 10.0000 mg | Freq: Every day | RECTAL | Status: DC | PRN

## 2017-11-12 MED ORDER — VANCOMYCIN HCL IN DEXTROSE 1-5 GM/200ML-% IV SOLN
1000.0000 mg | INTRAVENOUS | Status: AC
  Administered 2017-11-12: 1000 mg via INTRAVENOUS
  Filled 2017-11-12: qty 200

## 2017-11-12 MED ORDER — VERAPAMIL HCL ER 240 MG PO TBCR
240.0000 mg | EXTENDED_RELEASE_TABLET | Freq: Every day | ORAL | Status: DC
  Administered 2017-11-13: 240 mg via ORAL
  Filled 2017-11-12: qty 1

## 2017-11-12 SURGICAL SUPPLY — 50 items
ADH SKN CLS APL DERMABOND .7 (GAUZE/BANDAGES/DRESSINGS) ×1
ALLOGRAFT 10X11X14CC (Bone Implant) ×1 IMPLANT
ALLOGRAFT TRIAD LORDOTIC CC (Bone Implant) ×1 IMPLANT
BAG DECANTER FOR FLEXI CONT (MISCELLANEOUS) ×2 IMPLANT
BASKET BONE COLLECTION (BASKET) ×1 IMPLANT
BIT DRILL NEURO 2X3.1 SFT TUCH (MISCELLANEOUS) ×1 IMPLANT
BNDG GAUZE ELAST 4 BULKY (GAUZE/BANDAGES/DRESSINGS) IMPLANT
BUR BARREL STRAIGHT FLUTE 4.0 (BURR) IMPLANT
BUR CROSS CUT FISSURE 1.2 (BURR) ×2 IMPLANT
CANISTER SUCT 3000ML PPV (MISCELLANEOUS) ×2 IMPLANT
DECANTER SPIKE VIAL GLASS SM (MISCELLANEOUS) ×2 IMPLANT
DERMABOND ADVANCED (GAUZE/BANDAGES/DRESSINGS) ×1
DERMABOND ADVANCED .7 DNX12 (GAUZE/BANDAGES/DRESSINGS) ×1 IMPLANT
DRAPE LAPAROTOMY 100X72 PEDS (DRAPES) ×2 IMPLANT
DRAPE MICROSCOPE LEICA (MISCELLANEOUS) IMPLANT
DRILL NEURO 2X3.1 SOFT TOUCH (MISCELLANEOUS) ×2
DURAPREP 6ML APPLICATOR 50/CS (WOUND CARE) ×2 IMPLANT
ELECT REM PT RETURN 9FT ADLT (ELECTROSURGICAL) ×2
ELECTRODE REM PT RTRN 9FT ADLT (ELECTROSURGICAL) ×1 IMPLANT
GAUZE 4X4 16PLY RFD (DISPOSABLE) IMPLANT
GLOVE BIOGEL PI IND STRL 7.0 (GLOVE) IMPLANT
GLOVE BIOGEL PI IND STRL 8.5 (GLOVE) ×1 IMPLANT
GLOVE BIOGEL PI INDICATOR 7.0 (GLOVE) ×1
GLOVE BIOGEL PI INDICATOR 8.5 (GLOVE) ×1
GLOVE ECLIPSE 8.5 STRL (GLOVE) ×2 IMPLANT
GLOVE SURG SS PI 7.0 STRL IVOR (GLOVE) ×1 IMPLANT
GOWN STRL REUS W/ TWL LRG LVL3 (GOWN DISPOSABLE) IMPLANT
GOWN STRL REUS W/ TWL XL LVL3 (GOWN DISPOSABLE) ×1 IMPLANT
GOWN STRL REUS W/TWL 2XL LVL3 (GOWN DISPOSABLE) ×2 IMPLANT
GOWN STRL REUS W/TWL LRG LVL3 (GOWN DISPOSABLE)
GOWN STRL REUS W/TWL XL LVL3 (GOWN DISPOSABLE) ×2
HALTER HD/CHIN CERV TRACTION D (MISCELLANEOUS) ×2 IMPLANT
HEMOSTAT POWDER KIT SURGIFOAM (HEMOSTASIS) ×2 IMPLANT
KIT BASIN OR (CUSTOM PROCEDURE TRAY) ×2 IMPLANT
KIT TURNOVER KIT B (KITS) ×2 IMPLANT
NDL SPNL 22GX3.5 QUINCKE BK (NEEDLE) ×1 IMPLANT
NEEDLE HYPO 22GX1.5 SAFETY (NEEDLE) ×2 IMPLANT
NEEDLE SPNL 22GX3.5 QUINCKE BK (NEEDLE) ×2 IMPLANT
NS IRRIG 1000ML POUR BTL (IV SOLUTION) ×2 IMPLANT
PACK LAMINECTOMY NEURO (CUSTOM PROCEDURE TRAY) ×2 IMPLANT
PAD ARMBOARD 7.5X6 YLW CONV (MISCELLANEOUS) ×6 IMPLANT
PLATE ARCHON 1-LEVEL 30MM (Plate) ×1 IMPLANT
RUBBERBAND STERILE (MISCELLANEOUS) IMPLANT
SCREW ARCHON SELFTAP 4.0X13 (Screw) ×2 IMPLANT
SCREW ARCHON ST VAR 4.0X17MM (Screw) ×2 IMPLANT
SPONGE INTESTINAL PEANUT (DISPOSABLE) ×2 IMPLANT
SUT VIC AB 4-0 RB1 18 (SUTURE) ×2 IMPLANT
TOWEL GREEN STERILE (TOWEL DISPOSABLE) ×2 IMPLANT
TOWEL GREEN STERILE FF (TOWEL DISPOSABLE) ×2 IMPLANT
WATER STERILE IRR 1000ML POUR (IV SOLUTION) ×2 IMPLANT

## 2017-11-12 NOTE — Anesthesia Preprocedure Evaluation (Addendum)
Anesthesia Evaluation  Patient identified by MRN, date of birth, ID band Patient awake    Reviewed: Allergy & Precautions, NPO status , Patient's Chart, lab work & pertinent test results, reviewed documented beta blocker date and time   History of Anesthesia Complications (+) PONV and history of anesthetic complications  Airway Mallampati: II  TM Distance: <3 FB Neck ROM: Full    Dental  (+) Dental Advisory Given   Pulmonary neg pulmonary ROS, pneumonia,    Pulmonary exam normal breath sounds clear to auscultation       Cardiovascular hypertension, Pt. on medications and Pt. on home beta blockers Normal cardiovascular exam+ dysrhythmias  Rhythm:Regular Rate:Normal     Neuro/Psych  Headaches, PSYCHIATRIC DISORDERS Anxiety Depression    GI/Hepatic Neg liver ROS, hiatal hernia, GERD  ,  Endo/Other  diabetes, Insulin Dependent  Renal/GU negative Renal ROS     Musculoskeletal  (+) Arthritis , Fibromyalgia -  Abdominal   Peds negative pediatric ROS (+)  Hematology negative hematology ROS (+) anemia ,   Anesthesia Other Findings   Reproductive/Obstetrics negative OB ROS                             Anesthesia Physical  Anesthesia Plan  ASA: III  Anesthesia Plan: General   Post-op Pain Management:    Induction: Intravenous  PONV Risk Score and Plan: 4 or greater and Ondansetron, Dexamethasone and Propofol infusion  Airway Management Planned: Oral ETT  Additional Equipment: None  Intra-op Plan:   Post-operative Plan: Extubation in OR  Informed Consent: I have reviewed the patients History and Physical, chart, labs and discussed the procedure including the risks, benefits and alternatives for the proposed anesthesia with the patient or authorized representative who has indicated his/her understanding and acceptance.   Dental advisory given  Plan Discussed with: CRNA  Anesthesia  Plan Comments:       Anesthesia Quick Evaluation

## 2017-11-12 NOTE — Progress Notes (Signed)
Patient ID: [name], female   DOB: Jan 13, 1941, 77 y.o.   MRN: [id number] Patient is awake and alert Vital signs are stable Motor function appears intact with about the same amount of C8 radicular changes Stable postop

## 2017-11-12 NOTE — Progress Notes (Signed)
Inpatient Diabetes Program Recommendations  AACE/ADA: New Consensus Statement on Inpatient Glycemic Control (2015)  Target Ranges:  Prepandial:   less than 140 mg/dL      Peak postprandial:   less than 180 mg/dL (1-2 hours)      Critically ill patients:  140 - 180 mg/dL   Lab Results  Component Value Date   GLUCAP 125 (H) 11/12/2017   HGBA1C 6.7 (H) 09/04/2017    Review of Glycemic ControlResults for BERNIS, STECHER (MRN [id number]) as of 11/12/2017 14:20  Ref. Range 11/12/2017 10:19 11/12/2017 14:16  Glucose-Capillary Latest Ref Range: 70 - 99 mg/dL 138 (H) 125 (H)    Diabetes history: Type 2 DM  Outpatient Diabetes medications: Basaglar 60 units q HS Current orders for Inpatient glycemic control:  None noted Inpatient Diabetes Program Recommendations:    Referral received for "Post-op" management of CBG's.  Recommend the addition of Novolog moderate correction tid with meals and HS (per glycemic control order set).  Also please add Lantus 30 units q HS (this is 1/2 of home dose of Basaglar).  Will call and discuss with RN.   Thanks,  Adah Perl, RN, BC-ADM Inpatient Diabetes Coordinator Pager [phone] (8a-5p)

## 2017-11-12 NOTE — Progress Notes (Signed)
Orthopedic Tech Progress Note Patient Details:  [name] [birth year] [id number]  Ortho Devices Type of Ortho Device: Soft collar Ortho Device/Splint Interventions: Application   Post Interventions Patient Tolerated: Well Instructions Provided: Care of device   [place] Pink 11/12/2017, 4:56 PM

## 2017-11-12 NOTE — Anesthesia Procedure Notes (Signed)
Procedure Name: Intubation Date/Time: 11/12/2017 11:46 AM Performed by: Cheral Cappucci T, CRNA Pre-anesthesia Checklist: Patient identified, Emergency Drugs available, Suction available and Patient being monitored Patient Re-evaluated:Patient Re-evaluated prior to induction Oxygen Delivery Method: Circle system utilized Preoxygenation: Pre-oxygenation with 100% oxygen Induction Type: IV induction Ventilation: Mask ventilation without difficulty and Oral airway inserted - appropriate to patient size Laryngoscope Size: Miller and 2 Grade View: Grade II Tube type: Oral Tube size: 7.5 mm Number of attempts: 1 Airway Equipment and Method: Patient positioned with wedge pillow and Stylet Placement Confirmation: ETT inserted through vocal cords under direct vision,  positive ETCO2 and breath sounds checked- equal and bilateral Secured at: 22 cm Tube secured with: Tape Dental Injury: Teeth and Oropharynx as per pre-operative assessment

## 2017-11-12 NOTE — Transfer of Care (Signed)
Immediate Anesthesia Transfer of Care Note  Patient: [name]  Procedure(s) Performed: Cervical seven to Thoracic one  Anterior cervical decompression/discectomy/fusion with removal of old plate (N/A )  Patient Location: PACU  Anesthesia Type:General  Level of Consciousness: drowsy  Airway & Oxygen Therapy: Patient Spontanous Breathing and Patient connected to nasal cannula oxygen  Post-op Assessment: Report given to RN, Post -op Vital signs reviewed and stable and Patient moving all extremities  Post vital signs: Reviewed and stable  Last Vitals:  Vitals Value Taken Time  BP 144/65 11/12/2017  2:13 PM  Temp 36.4 C 11/12/2017  2:13 PM  Pulse 70 11/12/2017  2:15 PM  Resp 33 11/12/2017  2:15 PM  SpO2 88 % 11/12/2017  2:15 PM  Vitals shown include unvalidated device data.  Last Pain:  Vitals:   11/12/17 1029  TempSrc:   PainSc: 0-No pain      Patients Stated Pain Goal: 2 (44/31/54 0086)  Complications: No apparent anesthesia complications

## 2017-11-12 NOTE — Evaluation (Signed)
Occupational Therapy Evaluation Patient Details Name: [name] MRN: [id number] DOB: January 04, 1941 Today's Date: 11/12/2017    History of Present Illness  Anterior cervical decompression C 7 T1.  Removal of inferior portion of previous structural plate from D3-U2.  Partial corpectomy of C7.  Anterior cervical fusion with structural allograft and plate fixation G2-R4. PHMx: LTKR   Clinical Impression   This 77 yo female admitted and underwent above presents to acute OT with decreased use of right hand, numbness in right hand, and decreased balance all affecting her safety and independence with basic ADLs. She will benefit from acute OT with follow up [place].    Follow Up Recommendations  Home health OT;Supervision - Intermittent    Equipment Recommendations  None recommended by OT       Precautions / Restrictions Precautions Precautions: Cervical Precaution Booklet Issued: Yes (comment) Required Braces or Orthoses: Cervical Brace Cervical Brace: Soft collar(can have off for bathing, dressing, eating, oral care) Restrictions Weight Bearing Restrictions: No      Mobility Bed Mobility Overal bed mobility: Needs Assistance Bed Mobility: Rolling;Sidelying to Sit Rolling: Supervision(HOB elevated) Sidelying to sit: Supervision;HOB elevated       General bed mobility comments: VCs for technique  Transfers Overall transfer level: Needs assistance Equipment used: Rolling walker (2 wheeled) Transfers: Sit to/from Stand Sit to Stand: Min guard         General transfer comment: Ambulated around bed with uni lateral HHA and pt was unsteady so went to RW for ambulation    Balance Overall balance assessment: Mild deficits observed, not formally tested                                         ADL either performed or assessed with clinical judgement   ADL Overall ADL's : Needs assistance/impaired Eating/Feeding: Set up Eating/Feeding Details (indicate cue type  and reason): needs A to open containers Grooming: Supervision/safety;Set up;Sitting   Upper Body Bathing: Supervision/ safety;Set up;Sitting   Lower Body Bathing: Set up;Supervison/ safety Lower Body Bathing Details (indicate cue type and reason): min guard A sit<>stand Upper Body Dressing : Set up;Supervision/safety;Sitting   Lower Body Dressing: Set up;Supervision/safety Lower Body Dressing Details (indicate cue type and reason): min guard A sit<>stand Toilet Transfer: Min guard;Ambulation;RW;Regular Toilet;Grab bars   Toileting- Clothing Manipulation and Hygiene: Min guard;Sit to/from stand         General ADL Comments: Reports she has had to change to pen insulin due to she cannot draw up her own insulin     Vision Patient Visual Report: No change from baseline              Pertinent Vitals/Pain Pain Assessment: No/denies pain     Hand Dominance Right   Extremity/Trunk Assessment Upper Extremity Assessment Upper Extremity Assessment: LUE deficits/detail LUE Deficits / Details: Decreased active extension of 2nd digit, can passively extend it and it will stay extended. Limited adduction of digits and cannot adduct. Can oppose thumb and digits 2-4 with effort but not 5th digit, with finger extension her wrist is in a semi-flexion position. Full AROM of all other joints with at least 3/5 strength. She was able to totally resist me trying to extend her digits from a flexed postion LUE Sensation: (digits feel numb--but reports 4 and 5 she can feel a little more now post surgery, but not digits 1-3.) LUE Coordination: decreased fine  motor           Communication Communication Communication: No difficulties   Cognition Arousal/Alertness: Awake/alert Behavior During Therapy: WFL for tasks assessed/performed Overall Cognitive Status: Within Functional Limits for tasks assessed                                                Home Living Family/patient  expects to be discharged to:: Private residence Living Arrangements: Alone Available Help at Discharge: Family(most of time for first 3 days then sporadically) Type of Home: House Home Access: [place]: One level     Bathroom Shower/Tub: Occupational psychologist: Handicapped height     Reading: Clinical cytogeneticist - 2 wheels;Bedside commode;Hand held shower head;Cane - single point   Additional Comments: adustable bed      Prior Functioning/Environment Level of Independence: Independent with assistive device(s)        Comments: used either the walker or the cane, many falls prior to knee surgery this year        OT Problem List: Decreased coordination;Decreased strength;Impaired UE functional use;Impaired balance (sitting and/or standing)      OT Treatment/Interventions: Self-care/ADL training;Balance training;DME and/or AE instruction;Patient/family education;Therapeutic exercise;Therapeutic activities    OT Goals(Current goals can be found in the care plan section) Acute Rehab OT Goals Patient Stated Goal: to go home and get back to quilitng OT Goal Formulation: With patient Time For Goal Achievement: 11/19/17 Potential to Achieve Goals: Good  OT Frequency: Min 2X/week              AM-PAC PT "6 Clicks" Daily Activity     Outcome Measure Help from another person eating meals?: A Little Help from another person taking care of personal grooming?: A Little Help from another person toileting, which includes using toliet, bedpan, or urinal?: A Little Help from another person bathing (including washing, rinsing, drying)?: A Little Help from another person to put on and taking off regular upper body clothing?: A Little Help from another person to put on and taking off regular lower body clothing?: A Little 6 Click Score: 18   End of Session Equipment Utilized During Treatment: Rolling walker;Gait belt;Cervical collar Nurse  Communication: (walk with RW for now)  Activity Tolerance: Patient tolerated treatment well Patient left: in bed;with call bell/phone within reach;with family/visitor present  OT Visit Diagnosis: Unsteadiness on feet (R26.81);Other abnormalities of gait and mobility (R26.89)                Time: 1645-1730 OT Time Calculation (min): 45 min Charges:  OT General Charges $OT Visit: 1 Visit OT Evaluation $OT Eval Moderate Complexity: 1 Mod OT Treatments $Self Care/Home Management : 23-37 mins  [place] Circle, OTR/L Acute [employer] Pager [phone] Office [phone]

## 2017-11-12 NOTE — H&P (Signed)
[name] is an 77 y.o. female.   Chief Complaint: Bilateral upper extremity numbness pain weakness in the left hand unsteadiness of gait HPI: [name] is a 77 year old individual whom I have seen and treated in the past for lumbar spondylitic disease in addition to cervical spinal stenosis.  She underwent a decompression and fusion from C4-C7 a number of years ago.  In the last year she is developed progressive weakness in her left upper extremity particularly but also numbness in both hands and difficulty with her balance and her ability to ambulate recent MRI demonstrates that she has advanced spondylosis at the C7-T1 level with by foraminal stenosis.  EMG and nerve conduction studies also confirmed the presence of significant left cervical radiculopathy that is severe in the C8 distribution in addition to some milder forms of carpal tunnel syndrome and ulnar neuropathy at the elbow.  After careful consideration of her options we discussed surgical decompression at C7-T1 via an anterior approach.  She is now admitted for this procedure.  I also noted to [place] that at some point she may need to go further posterior stabilization.  Past Medical History:  Diagnosis Date  . Anemia    during 1st pregnancy  . Anxiety   . Arthritis   . Complication of anesthesia   . Depression   . Diarrhea, functional   . Dysrhythmia    Paroysmal Atrial Tachycardia  . Essential hypertension   . Fibromyalgia   . GERD (gastroesophageal reflux disease)   . H/O acute pancreatitis   . Headache   . History of hiatal hernia   . Hyperlipemia   . Pneumonia   . PONV (postoperative nausea and vomiting)   . PSVT (paroxysmal supraventricular tachycardia) ([place])   . Reflux   . Restless leg syndrome   . Type 2 diabetes mellitus ([place])    Type II    Past Surgical History:  Procedure Laterality Date  . ABDOMINAL HYSTERECTOMY    . ANKLE SURGERY    . BACK SURGERY     T12-L1 fusion, Anterior Cervical fusion   . CARDIAC CATHETERIZATION N/A 02/16/2015   Procedure: Left Heart Cath and Coronary Angiography;  Surgeon: Sherren Mocha, MD;  Location: [facility];  Service: Cardiovascular;  Laterality: N/A;  . CHOLECYSTECTOMY    . COLONOSCOPY    . ESOPHAGOGASTRODUODENOSCOPY  12/21/2010   Procedure: ESOPHAGOGASTRODUODENOSCOPY (EGD);  Surgeon: Landry Dyke, MD;  Location: [facility];  Service: Endoscopy;  Laterality: N/A;  . EXCISION MORTON'S NEUROMA    . EYE SURGERY Bilateral    Cataract  . FRACTURE SURGERY    . KNEE SURGERY     x 3  . NECK SURGERY    . ROTATOR CUFF REPAIR    . TONSILLECTOMY    . TOTAL KNEE ARTHROPLASTY Left 04/10/2017   Procedure: LEFT TOTAL KNEE ARTHROPLASTY;  Surgeon: Paralee Cancel, MD;  Location: [facility];  Service: Orthopedics;  Laterality: Left;  70 mins  . UPPER GASTROINTESTINAL ENDOSCOPY    . WRIST SURGERY      Family History  Problem Relation Age of Onset  . Heart failure Mother   . Hyperlipidemia Mother   . Lung cancer Father        Lymphoma  . Hyperlipidemia Maternal Grandmother   . Peripheral vascular disease Maternal Grandmother    Social History:  reports that she has never smoked. She has never used smokeless tobacco. She reports that she does not drink alcohol or use drugs.  Allergies:  Allergies  Allergen Reactions  . Amoxil [Amoxicillin] Swelling and Other (See Comments)    Lips Has patient had a PCN reaction causing immediate rash, facial/tongue/throat swelling, SOB or lightheadedness with hypotension:YES Has patient had a PCN reaction causing severe rash involving mucus membranes or skin necrosis:No Has patient had a PCN reaction that required hospitalization:No Has patient had a PCN reaction occurring within the last 10 years:Yes. If all of the above answers are "NO", then may proceed with Cephalosporin use.   . Chocolate Hives  . Demerol Nausea And Vomiting  . Lisinopril Itching and Swelling  . Other     Raw food or nuts - GI pain     No medications prior to admission.    No results found for this or any previous visit (from the past 48 hour(s)). No results found.  Review of Systems  Constitutional: Negative.   HENT: Negative.   Eyes: Negative.   Respiratory: Negative.   Cardiovascular: Negative.   Gastrointestinal: Negative.   Genitourinary: Negative.   Musculoskeletal: Positive for neck pain.  Skin: Negative.   Neurological: Positive for weakness.       Unsteadiness of gait  Endo/Heme/Allergies: Negative.   Psychiatric/Behavioral: Positive for depression.    There were no vitals taken for this visit. Physical Exam  Constitutional: She is oriented to person, place, and time. She appears well-developed and well-nourished.  HENT:  Head: Normocephalic and atraumatic.  Eyes: Pupils are equal, round, and reactive to light. Conjunctivae are normal.  Neck:  Limited range of motion turning 45 degrees left and right flexing and extending less than 50% of normal.  Cardiovascular: Normal rate and regular rhythm.  Respiratory: Effort normal and breath sounds normal.  Neurological: She is alert and oriented to person, place, and time.  Bilateral intrinsic weakness worse on the left with atrophy in the thenar and hyperthenar eminence.  Absent biceps triceps reflex absent brachial radialis reflex 2+ patellar reflex absent Achilles reflex equivocal Babinski's bilaterally.  Wide-based gait and Romberg test.  Skin: Skin is warm and dry.  Psychiatric: She has a normal mood and affect. Her behavior is normal. Judgment and thought content normal.     Assessment/Plan Cervical radiculopathy C8 distribution myelopathy C7-T1.  Plan: Removal of the inferior portion of the plate at C1-Y6 with stabilization and decompression of C7-T1.  Earleen Newport, MD 11/12/2017, 8:01 AM

## 2017-11-12 NOTE — Op Note (Signed)
Date of surgery: 11/12/2017 Preoperative diagnosis: Cervical spondylosis with myelopathy and radiculopathy C7-T1.  History of decompression and fusion C4-C7. Postoperative diagnosis: Same Procedure: Anterior cervical decompression C 7 T1.  Removal of inferior portion of previous structural plate from Z6-S0.  Partial corpectomy of C7.  Anterior cervical fusion with structural allograft and plate fixation Y3-K1. Surgeon: [name] First assistant: Deatra Ina, MD Anesthesia: General endotracheal Indications: [name] is a 77 year old female who is had previous cervical decompression from C4-C7.  She has developed a significant myelopathy secondary to cord compression at the level of C7-T1 with cervical radiculopathy at that level also.  She is been advised regarding the need for surgical decompression and stabilization at C7-T1.  Procedure: The patient was brought to the operating room supine on the stretcher.  After the smooth induction of general endotracheal anesthesia, she was placed in 5 pounds of halter traction.  The shoulders were taped inferiorly.  The neck was prepped with alcohol DuraPrep and draped in a sterile fashion a transverse incision was created in the inferior margin of the cervical spine just above the clavicle on the left side.  Dissection was carried down through the platysma and the plane between the sternocleidomastoid and strap muscles was dissected bluntly.  Then by dissecting under the omohyoid muscle the prevertebral space was reached and the inferior aspect of the previously placed plate was uncovered.  By dissecting carefully up portion of the inferior plate to the screws at C6 was uncovered.  Then a high-speed drill with a metal cutting bit was used to amputate the inferior portion of the plate at this level.  The inferior screws were then removed from C7.  Further dissection yielded the C7-T1 interspace.  Because of the trajectory and the positioning of the  collarbone and the manubrium in this region it is felt that ultimately a partial corpectomy of C7 would need to be performed in order to adequately visualize the most posterior part of the disc space and decompress the dural sac.  With this in mind then a partial corpectomy of C7 was completed and this allowed visualization of the posterior aspect of the disc space then using a high-speed drill the discectomy was completed in the posterior longitudinal ligament was opened to allow good decompression of the central canal and the lateral recesses for the C8 nerve root on either side.  The C8 nerve root was decompressed amply and hemostasis in the epidural space was obtained with some Gelfoam soaked thrombin which was later irrigated away.  Once a good decompression had been obtained the interspace was sized for appropriate size spacer and is felt that a 10 mm tall lordotic graft with dimensions of 10 x 14 x 11.  This was a triad structural allograft.  Then the plate was placed between C7 and T1 with 213 mm screws placed in C7 and 217 mm screws placed in T1 plane length was measured at 30 mm was in our contemplate.  Tunnel attempts at radiographs visualized of the plate over the S0-F0 space was difficult to see much detail secondary to the patient's shoulders.  Blood loss for the procedure was estimated at less than 100 cc.

## 2017-11-13 ENCOUNTER — Other Ambulatory Visit: Payer: Self-pay | Admitting: *Deleted

## 2017-11-13 ENCOUNTER — Encounter (HOSPITAL_COMMUNITY): Payer: Self-pay | Admitting: Neurological Surgery

## 2017-11-13 DIAGNOSIS — E785 Hyperlipidemia, unspecified: Secondary | ICD-10-CM

## 2017-11-13 DIAGNOSIS — M4712 Other spondylosis with myelopathy, cervical region: Secondary | ICD-10-CM | POA: Diagnosis not present

## 2017-11-13 LAB — GLUCOSE, CAPILLARY: Glucose-Capillary: 196 mg/dL — ABNORMAL HIGH (ref 70–99)

## 2017-11-13 LAB — HEMOGLOBIN A1C
Hgb A1c MFr Bld: 7.6 % — ABNORMAL HIGH (ref 4.8–5.6)
Mean Plasma Glucose: 171 mg/dL

## 2017-11-13 MED ORDER — METHOCARBAMOL 500 MG PO TABS: 500.0000 mg | ORAL_TABLET | Freq: Four times a day (QID) | ORAL | 3 refills | Status: DC | PRN

## 2017-11-13 MED ORDER — HYDROCODONE-ACETAMINOPHEN 5-325 MG PO TABS: 1.0000 | ORAL_TABLET | ORAL | 0 refills | Status: DC | PRN

## 2017-11-13 NOTE — Anesthesia Postprocedure Evaluation (Signed)
Anesthesia Post Note  Patient: [name]  Procedure(s) Performed: Cervical seven to Thoracic one  Anterior cervical decompression/discectomy/fusion with removal of old plate (N/A )     Patient location during evaluation: PACU Anesthesia Type: General Level of consciousness: sedated and patient cooperative Pain management: pain level controlled Vital Signs Assessment: post-procedure vital signs reviewed and stable Respiratory status: spontaneous breathing Cardiovascular status: stable Anesthetic complications: no    Last Vitals:  Vitals:   11/13/17 0441 11/13/17 0715  BP: (!) 158/91 (!) 160/94  Pulse: 80 84  Resp: 18 16  Temp: 36.8 C 36.5 C  SpO2: 97% 97%    Last Pain:  Vitals:   11/13/17 0715  TempSrc: Oral  PainSc:                  Nolon Nations

## 2017-11-13 NOTE — Clinical Social Work Note (Signed)
CSW acknowledges SNF consult. PT recommending HHPT.  CSW signing off. Consult again if any other social work needs arise.  Shantina Chronister, CSW [phone]  

## 2017-11-13 NOTE — Care Management Note (Signed)
Case Management Note  Patient Details  Name: [name] MRN: [id number] Date of Birth: [birth year]  Subjective/Objective: 77 yr old female s/p C4-C7 ACDF.             Action/Plan:  Case manager spoke with patient and her daughter concerning discharge plan and need for Home Health. Choice for [place] offered, referral called to Neoma Laming, [place] Liaison. Patient will have family support at discharge.    Expected Discharge Date:  11/13/17               Expected Discharge Plan:  [place]  In-House Referral:  NA  Discharge planning Services  CM Consult  Post Acute Care Choice:  Home Health Choice offered to:  Patient  DME Arranged:  N/A DME Agency:  NA  HH Arranged:  PT, OT HH Agency:  [facility]  Status of Service:  Completed, signed off  If discussed at [place] of Stay Meetings, dates discussed:    Additional Comments:  Ninfa Meeker, RN 11/13/2017, 12:25 PM

## 2017-11-13 NOTE — Progress Notes (Signed)
Patient alert and oriented, mae's well, voiding adequate amount of urine, swallowing without difficulty, no c/o pain at time of discharge. Patient discharged home with family. Script and discharged instructions given to patient. Patient and family stated understanding of instructions given. Patient has an appointment with Dr. Elsner  

## 2017-11-13 NOTE — Evaluation (Signed)
Physical Therapy Evaluation Patient Details Name: [name] MRN: [id number] DOB: [birth year] Today's Date: 11/13/2017   History of Present Illness   Anterior cervical decompression C 7 T1.  Removal of inferior portion of previous structural plate from D2-K0.  Partial corpectomy of C7.  Anterior cervical fusion with structural allograft and plate fixation U5-K2. PHMx: LTKR  Clinical Impression  Pt admitted with above diagnosis. Pt currently with functional limitations due to the deficits listed below (see PT Problem List). At the time of PT eval pt was able to perform transfers and ambulation with gross min guard assist to supervision for safety. Pt was educated on car transfer, precautions, safe activity progression, and positioning recommendations. Pt will benefit from skilled PT to increase their independence and safety with mobility to allow discharge to the venue listed below.       Follow Up Recommendations Home health PT;Supervision for mobility/OOB    Equipment Recommendations  None recommended by PT    Recommendations for Other Services       Precautions / Restrictions Precautions Precautions: Cervical Precaution Booklet Issued: Yes (comment) Required Braces or Orthoses: Cervical Brace Cervical Brace: Soft collar(can have off for bathing, dressing, eating, oral care) Restrictions Weight Bearing Restrictions: No      Mobility  Bed Mobility               General bed mobility comments: Pt received sitting up EOB finishing her breakfast.   Transfers Overall transfer level: Needs assistance Equipment used: Rolling walker (2 wheeled) Transfers: Sit to/from Stand Sit to Stand: Min guard         General transfer comment: VC's for hand placement on seated surface for safety. Pt was able to demonstrate good power-up to full stand without assistance, however hands-on guarding provided for safety.   Ambulation/Gait Ambulation/Gait assistance: Min  guard;Supervision Gait Distance (Feet): 300 Feet Assistive device: Rolling walker (2 wheeled) Gait Pattern/deviations: Step-through pattern;Decreased stride length;Trunk flexed   Gait velocity interpretation: 1.31 - 2.62 ft/sec, indicative of limited community ambulator General Gait Details: VC's for improved posture. Pt was initially requiring min guard assist for balance support and safety however progressed to supervision for safety by end of gait training.   Stairs            Wheelchair Mobility    Modified Rankin (Stroke Patients Only)       Balance Overall balance assessment: Mild deficits observed, not formally tested                                           Pertinent Vitals/Pain Pain Assessment: No/denies pain    Home Living Family/patient expects to be discharged to:: Private residence Living Arrangements: Alone Available Help at Discharge: Family(most of time for first 3 days then sporadically) Type of Home: House Home Access: Ramped entrance     Home Layout: One level [place]: Clinical cytogeneticist - 2 wheels;Bedside commode;Hand held shower head;Cane - single point Additional Comments: adustable bed    Prior Function Level of Independence: Independent with assistive device(s)         Comments: used either the walker or the cane, many falls prior to knee surgery this year     Hand Dominance   Dominant Hand: Right    Extremity/Trunk Assessment   Upper Extremity Assessment Upper Extremity Assessment: Defer to OT evaluation    Lower Extremity Assessment Lower  Extremity Assessment: Generalized weakness(Consistent with pre-op diagnosis. )    Cervical / Trunk Assessment Cervical / Trunk Assessment: Other exceptions Cervical / Trunk Exceptions: s/p surgery  Communication   Communication: No difficulties  Cognition Arousal/Alertness: Awake/alert Behavior During Therapy: WFL for tasks assessed/performed Overall Cognitive  Status: Within Functional Limits for tasks assessed                                        General Comments      Exercises     Assessment/Plan    PT Assessment Patient needs continued PT services  PT Problem List Decreased strength;Decreased range of motion;Decreased activity tolerance;Decreased balance;Decreased mobility;Decreased knowledge of use of DME;Decreased safety awareness;Decreased knowledge of precautions;Pain       PT Treatment Interventions DME instruction;Gait training;Stair training;Functional mobility training;Therapeutic activities;Therapeutic exercise;Neuromuscular re-education;Patient/family education    PT Goals (Current goals can be found in the Care Plan section)  Acute Rehab PT Goals Patient Stated Goal: to go home and get back to quilitng - not go to a nursing home PT Goal Formulation: With patient Time For Goal Achievement: 11/20/17 Potential to Achieve Goals: Good    Frequency Min 5X/week   Barriers to discharge        Co-evaluation               AM-PAC PT "6 Clicks" Daily Activity  Outcome Measure Difficulty turning over in bed (including adjusting bedclothes, sheets and blankets)?: None Difficulty moving from lying on back to sitting on the side of the bed? : A Little Difficulty sitting down on and standing up from a chair with arms (e.g., wheelchair, bedside commode, etc,.)?: A Little Help needed moving to and from a bed to chair (including a wheelchair)?: A Little Help needed walking in hospital room?: A Little Help needed climbing 3-5 steps with a railing? : A Little 6 Click Score: 19    End of Session Equipment Utilized During Treatment: Gait belt Activity Tolerance: Patient tolerated treatment well Patient left: Other (comment)(EOB with family present and OT beginning her session) Nurse Communication: Mobility status PT Visit Diagnosis: Unsteadiness on feet (R26.81);Pain Pain - part of body: (neck)    Time:  0093-8182 PT Time Calculation (min) (ACUTE ONLY): 19 min   Charges:   PT Evaluation $PT Eval Moderate Complexity: 1 Mod          [name], PT, DPT Acute Rehabilitation Services Pager: [phone] Office: [phone]   [name] 11/13/2017, 9:08 AM

## 2017-11-13 NOTE — Discharge Summary (Signed)
Physician Discharge Summary  Patient ID: [name] MRN: [id number] DOB/AGE: [birth year] 77 y.o.  Admit date: 11/12/2017 Discharge date: 11/13/2017  Admission Diagnoses: Cervical spondylosis with myelopathy and radiculopathy C7-T1  Discharge Diagnoses: Cervical spondylosis with myelopathy and radiculopathy C7-T1.  History of fusion C4-C7 Active Problems:   Cervical spondylosis with myelopathy   Discharged Condition: good  Hospital Course: Patient was admitted to undergo anterior cervical decompression at C7-T1 having had a previous decompression and fusion at C4-C7 he tolerated surgery well intrinsic weakness that was present preoperatively is still present but no worse  Consults: None  Significant Diagnostic Studies: None  Treatments: surgery: Anterior cervical decompression cervical 7 T1 partial remover of previously placed anterior plate C4-C7  Discharge Exam: Blood pressure (!) 160/94, pulse 84, temperature 97.7 F (36.5 C), temperature source Oral, resp. rate 16, height 5' 3.5" (1.613 m), weight 90.7 kg, SpO2 97 %. Station is stable gait remains wide-based intrinsic weakness on the left greater than the right hand remains unchanged at 3 out of 5  Disposition: Discharge disposition: 01-Home or Self Care       Discharge Instructions    Call MD for:  redness, tenderness, or signs of infection (pain, swelling, redness, odor or green/yellow discharge around incision site)   Complete by:  As directed    Call MD for:  severe uncontrolled pain   Complete by:  As directed    Call MD for:  temperature >100.4   Complete by:  As directed    Diet - low sodium heart healthy   Complete by:  As directed    Discharge instructions   Complete by:  As directed    Okay to shower. Do not apply salves or appointments to incision. No heavy lifting with the upper extremities greater than 15 pounds. May resume driving when not requiring pain medication and patient feels comfortable with  doing so.   Incentive spirometry RT   Complete by:  As directed    Increase activity slowly   Complete by:  As directed      Allergies as of 11/13/2017      Reactions   Amoxil [amoxicillin] Swelling, Other (See Comments)   Lips Has patient had a PCN reaction causing immediate rash, facial/tongue/throat swelling, SOB or lightheadedness with hypotension:YES Has patient had a PCN reaction causing severe rash involving mucus membranes or skin necrosis:No Has patient had a PCN reaction that required hospitalization:No Has patient had a PCN reaction occurring within the last 10 years:Yes. If all of the above answers are "NO", then may proceed with Cephalosporin use.   Chocolate Hives   Demerol Nausea And Vomiting   Lisinopril Itching, Swelling   Other    Raw food or nuts - GI pain      Medication List    TAKE these medications   acetaminophen 325 MG tablet Commonly known as:  TYLENOL Take 650 mg by mouth 2 (two) times daily as needed for moderate pain.   aspirin EC 81 MG tablet Take 81 mg by mouth every evening.   BASAGLAR KWIKPEN 100 UNIT/ML Sopn Inject 0.6 mLs (60 Units total) into the skin daily. What changed:    how much to take  when to take this   CALCIUM 600+D3 PO Take 1 tablet by mouth every evening.   cholecalciferol 1000 units tablet Commonly known as:  VITAMIN D Take 1,000 Units by mouth daily.   Cranberry 1000 MG Caps Take 1,000 mg by mouth 2 (two) times daily.  digoxin 0.125 MG tablet Commonly known as:  LANOXIN Take 0.125 mg by mouth daily.   ezetimibe 10 MG tablet Commonly known as:  ZETIA Take 1 tablet (10 mg total) by mouth daily. What changed:  when to take this   HYDROcodone-acetaminophen 5-325 MG tablet Commonly known as:  NORCO/VICODIN Take 1-2 tablets by mouth every 4 (four) hours as needed for severe pain ((score 7 to 10)).   ipratropium 0.06 % nasal spray Commonly known as:  ATROVENT Place 2 sprays into both nostrils 4 (four) times  daily.   meclizine 25 MG tablet Commonly known as:  ANTIVERT Take 25 mg by mouth 2 (two) times daily.   methocarbamol 500 MG tablet Commonly known as:  ROBAXIN Take 1 tablet (500 mg total) by mouth every 6 (six) hours as needed for muscle spasms. What changed:    when to take this  reasons to take this   metoprolol succinate 25 MG 24 hr tablet Commonly known as:  TOPROL-XL Take 1 tablet (25 mg total) by mouth daily.   nitroGLYCERIN 0.4 MG SL tablet Commonly known as:  NITROSTAT Place 0.4 mg under the tongue every 5 (five) minutes as needed for chest pain.   omeprazole 20 MG capsule Commonly known as:  PRILOSEC Take 1 capsule (20 mg total) by mouth daily. What changed:  when to take this   PRESERVISION AREDS 2 PO Take 1 capsule by mouth 2 (two) times daily.   ramipril 10 MG capsule Commonly known as:  ALTACE Take 1 capsule (10 mg total) by mouth every evening.   RELION INSULIN SYRINGE 1ML/31G 31G X 5/16" 1 ML Misc Generic drug:  Insulin Syringe-Needle U-100 Use as directed   rOPINIRole 1 MG tablet Commonly known as:  REQUIP Take 1 tablet (1 mg total) by mouth at bedtime.   sertraline 100 MG tablet Commonly known as:  ZOLOFT Take 1 tablet (100 mg total) by mouth every evening. What changed:  how much to take   verapamil 120 MG tablet Commonly known as:  CALAN Take 120 mg by mouth daily as needed (afib).   verapamil 240 MG 24 hr capsule Commonly known as:  VERELAN PM Take 1 capsule (240 mg total) by mouth daily.        SignedEarleen Newport 11/13/2017, 10:57 AM

## 2017-11-13 NOTE — Progress Notes (Signed)
Occupational Therapy Treatment Patient Details Name: [name] MRN: [id number] DOB: [birth year] Today's Date: 11/13/2017    History of present illness  Anterior cervical decompression C 7 T1.  Removal of inferior portion of previous structural plate from L7-L8.  Partial corpectomy of C7.  Anterior cervical fusion with structural allograft and plate fixation X2-J1. PHMx: LTKR   OT comments  This 77 yo female seen for treatment session today to focus on activities to work on her LUE (mainly hand) due to muscle atrophy and decreased use of 2nd digit as well as decreased strength in intrinsics. She will benefit from continued OT at home. I did give pt and family info that if left hand (more specifically 2nd digit) did not seem to be getting any better in the next month then I would recommend they get a referral to OPOT to assess for a dynamic splint for extension. No further OT needs, we will sign off.  Follow Up Recommendations  Home health OT;Supervision - Intermittent    Equipment Recommendations  None recommended by OT       Precautions / Restrictions Precautions Precautions: Cervical Precaution Booklet Issued: Yes (comment) Required Braces or Orthoses: Cervical Brace Cervical Brace: Soft collar(can have off for bathing, dressing, eating, and oral care) Restrictions Weight Bearing Restrictions: No       Mobility Bed Mobility               General bed mobility comments: Pt received sitting up EOB finishing her breakfast.   Transfers Overall transfer level: Needs assistance Equipment used: Rolling walker (2 wheeled) Transfers: Sit to/from Stand Sit to Stand: Min guard         General transfer comment: VC's for hand placement on seated surface for safety. Pt was able to demonstrate good power-up to full stand without assistance, however hands-on guarding provided for safety.     Balance Overall balance assessment: Mild deficits observed, not formally tested                                          ADL either performed or assessed with clinical judgement   ADL Overall ADL's : Needs assistance/impaired                     Lower Body Dressing: Set up;Supervision/safety Lower Body Dressing Details (indicate cue type and reason): she can cross legs to get to feet           Tub/Shower Transfer Details (indicate cue type and reason): reminded pt and family that it would be a good idea for someone to be in bathroom with patient when she is showering to be there just in case, for pt to sit on seat she has for showering and use hand held shower head         Vision Baseline Vision/History: Wears glasses Wears Glasses: At all times(trifocals) Patient Visual Report: No change from baseline            Cognition Arousal/Alertness: Awake/alert Behavior During Therapy: WFL for tasks assessed/performed Overall Cognitive Status: Within Functional Limits for tasks assessed                                          Exercises Other Exercises Other Exercises: Issued pt handout on  FM activities and went through ones I wanted her to do, as well theraputty sequence sheet. Pt demonstrated theraputty exercises on sheet and some of the FM activities sheet. HHOT should be able to continue to facilitate LUE exercises. Educated to do theraputty sequence 3x/day and do 3-4 of the activities on the sheet with this (picking different ones each time)           Pertinent Vitals/ Pain       Pain Assessment: No/denies pain  Home Living Family/patient expects to be discharged to:: Private residence Living Arrangements: Alone Available Help at Discharge: Family(most of time for first 3 days then sporadically) Type of Home: House Home Access: [place]: One level     Bathroom Shower/Tub: Occupational psychologist: [place]: Clinical cytogeneticist - 2 wheels;Bedside  commode;Hand held shower head;Cane - single point   Additional Comments: adustable bed      Prior Functioning/Environment Level of Independence: Independent with assistive device(s)        Comments: used either the walker or the cane, many falls prior to knee surgery this year   Frequency  Min 2X/week        Progress Toward Goals  OT Goals(current goals can now be found in the care plan section)  Progress towards OT goals: Progressing toward goals  Acute Rehab OT Goals Patient Stated Goal: to go home and get back to [place] - not go to a nursing home  Plan Discharge plan remains appropriate       AM-PAC PT "6 Clicks" Daily Activity     Outcome Measure   Help from another person eating meals?: A Little Help from another person taking care of personal grooming?: A Little Help from another person toileting, which includes using toliet, bedpan, or urinal?: A Little Help from another person bathing (including washing, rinsing, drying)?: A Little Help from another person to put on and taking off regular upper body clothing?: A Little Help from another person to put on and taking off regular lower body clothing?: A Little 6 Click Score: 18    End of Session    OT Visit Diagnosis: Unsteadiness on feet (R26.81);Other abnormalities of gait and mobility (R26.89)   Activity Tolerance Patient tolerated treatment well   Patient Left (sitting EOB)   Nurse Communication          Time: 0383-3383 OT Time Calculation (min): 54 min  Charges: OT General Charges $OT Visit: 1 Visit OT Evaluation $OT Eval Moderate Complexity: 1 Mod OT Treatments $Self Care/Home Management : 8-22 mins $Therapeutic Exercise: 38-52 mins  [place], OTR/L Acute [employer] Pager [phone] Office [phone]

## 2017-11-14 ENCOUNTER — Telehealth: Payer: Self-pay | Admitting: *Deleted

## 2017-11-14 ENCOUNTER — Telehealth: Payer: Self-pay | Admitting: Gastroenterology

## 2017-11-14 ENCOUNTER — Encounter (HOSPITAL_COMMUNITY): Payer: Self-pay | Admitting: Neurological Surgery

## 2017-11-14 DIAGNOSIS — M4712 Other spondylosis with myelopathy, cervical region: Secondary | ICD-10-CM | POA: Diagnosis not present

## 2017-11-14 DIAGNOSIS — Z794 Long term (current) use of insulin: Secondary | ICD-10-CM | POA: Diagnosis not present

## 2017-11-14 DIAGNOSIS — I10 Essential (primary) hypertension: Secondary | ICD-10-CM | POA: Diagnosis not present

## 2017-11-14 DIAGNOSIS — E119 Type 2 diabetes mellitus without complications: Secondary | ICD-10-CM | POA: Diagnosis not present

## 2017-11-14 DIAGNOSIS — Z7982 Long term (current) use of aspirin: Secondary | ICD-10-CM | POA: Diagnosis not present

## 2017-11-14 DIAGNOSIS — K219 Gastro-esophageal reflux disease without esophagitis: Secondary | ICD-10-CM | POA: Diagnosis not present

## 2017-11-14 DIAGNOSIS — F329 Major depressive disorder, single episode, unspecified: Secondary | ICD-10-CM | POA: Diagnosis not present

## 2017-11-14 DIAGNOSIS — Z96652 Presence of left artificial knee joint: Secondary | ICD-10-CM | POA: Diagnosis not present

## 2017-11-14 DIAGNOSIS — Z981 Arthrodesis status: Secondary | ICD-10-CM | POA: Diagnosis not present

## 2017-11-14 DIAGNOSIS — Z4789 Encounter for other orthopedic aftercare: Secondary | ICD-10-CM | POA: Diagnosis not present

## 2017-11-14 LAB — TYPE AND SCREEN
ABO/RH(D): A NEG
Antibody Screen: POSITIVE
Unit division: 0
Unit division: 0

## 2017-11-14 LAB — BPAM RBC
Blood Product Expiration Date: 201910062359
Blood Product Expiration Date: 201910162359
Unit Type and Rh: 600
Unit Type and Rh: 600

## 2017-11-14 NOTE — Telephone Encounter (Signed)
I reviewed the records from [place], where she had been seen previously.  Her last colonoscopy in November 2012 no polyps, but the bowel preparation was reportedly fair, so Dr. Paulita Fujita I recommended a repeat routine colonoscopy at a 5-year interval.  Since she is planning to follow with our practice, she needs to be put in for a recall.  Based on those records, she is due for routine colonoscopy anytime.  However, based on our discussion in clinic recently, it does not seem like a good time given the multitude of other medical issues she has going on at this time.  I would like her to call us when she feels ready to proceed with a routine colonoscopy, and I would most likely arrange a follow-up clinic visit with me or one of our APPs at that time.

## 2017-11-14 NOTE — Telephone Encounter (Signed)
Rx request for Ezetimibe 10 mg tablet daily #90

## 2017-11-14 NOTE — Telephone Encounter (Signed)
Pt has been notified and aware. She agrees with this plan. I will reach out to her in a few months for follow up.

## 2017-11-15 ENCOUNTER — Other Ambulatory Visit (HOSPITAL_COMMUNITY): Payer: Self-pay | Admitting: Neurological Surgery

## 2017-11-15 DIAGNOSIS — M5412 Radiculopathy, cervical region: Secondary | ICD-10-CM

## 2017-11-16 ENCOUNTER — Other Ambulatory Visit: Payer: Self-pay | Admitting: Family Medicine

## 2017-11-16 ENCOUNTER — Telehealth: Payer: Self-pay | Admitting: *Deleted

## 2017-11-16 ENCOUNTER — Other Ambulatory Visit: Payer: Self-pay

## 2017-11-16 DIAGNOSIS — E785 Hyperlipidemia, unspecified: Secondary | ICD-10-CM

## 2017-11-16 MED ORDER — EZETIMIBE 10 MG PO TABS: 10.0000 mg | ORAL_TABLET | Freq: Every day | ORAL | 1 refills | Status: DC

## 2017-11-16 NOTE — Telephone Encounter (Signed)
Rx request for Zetia10 mg, take 1 tablet by mouth every day #90

## 2017-11-16 NOTE — Telephone Encounter (Signed)
Prescription for Zetia 10 mg to continue taken daily was sent to her pharmacy. Thanks, BJ

## 2017-11-16 NOTE — Patient Outreach (Signed)
[place] [facility]) Care Management  Industry   11/16/2017  OBELIA BONELLO 1940-04-22 [id number]  77 year old female outreached by [place] services for a 30 day post discharge medication review.  PMHx includes, but not limited to, paroxysmal atrial tachycardia, hypertension, GERD, type 2 diabetes mellitus, depression, fibromyalgia and hyperlipidemia.  Successful outreach to Ms. Down's.  HIPAA identifiers verified.    Subjective: Ms. Fifield reports that she is doing well after her recent spinal decompression surgery.  She states that her hand is "slightly better."  She will have OT/PT visits for 2 weeks.  She reports that she checks her CBGs every morning and that she is concerned that her glucose is not as well controlled as in the past. She states she stopped metformin about a month ago due to diarrhea up to 6-8/times/day.  She states that she had been on metformin for ~ 12 years.  She states that she was on Lantus but switched to [place] (MD preference) and increased her dose when the metformin was discontinued.  She also states that due to her "club hand" it is more difficult to give herself an insulin injection. She uses the Basaglar insulin pen.  Patient consents to [facility] diabetes health coach referral.   Objective:  HgA1c 7.6% on 11/12/17 up from 6.7% on 09/04/17 SCr 0.94 mg/dL   Objective: Medications Reviewed Today    Reviewed by Dionne Milo, [facility] (Pharmacist) on 11/16/17 at 1013  Med List Status: <None>  Medication Order Taking? Sig Documenting Provider Last Dose Status Informant  acetaminophen (TYLENOL) 325 MG tablet [id number] Yes Take 650 mg by mouth 2 (two) times daily as needed for moderate pain.  [provider] Taking Active Self           Med Note Tamala Julian, JEFFREY W   Fri Sep 21, 2017  2:29 AM)    aspirin EC 81 MG tablet [id number] Yes Take 81 mg by mouth every evening. [provider] Taking Active Self  Calcium  Carb-Cholecalciferol (CALCIUM 600+D3 PO) [id number] Yes Take 1 tablet by mouth every evening. [provider] Taking Active Self  cholecalciferol (VITAMIN D) 1000 units tablet [id number] Yes Take 1,000 Units by mouth daily. [provider] Taking Active Self  Cranberry 1000 MG CAPS [id number] Yes Take 1,000 mg by mouth 2 (two) times daily.  [provider] Taking Active Self  digoxin (LANOXIN) 0.125 MG tablet [id number] Yes Take 0.125 mg by mouth daily. [provider] Taking Active Self  ezetimibe (ZETIA) 10 MG tablet [id number] Yes Take 1 tablet (10 mg total) by mouth daily.  Patient taking differently:  Take 10 mg by mouth every evening.    [name], [name], MD Taking Active Self  HYDROcodone-acetaminophen (NORCO/VICODIN) 5-325 MG tablet [id number] Yes Take 1-2 tablets by mouth every 4 (four) hours as needed for severe pain ((score 7 to 10)). [name], MD Taking Active   Insulin Glargine [facility]) 100 UNIT/ML SOPN [id number] Yes Inject 0.6 mLs (60 Units total) into the skin daily.  Patient taking differently:  Inject 65 Units into the skin at bedtime.    [name], [name], MD Taking Active Self  meclizine (ANTIVERT) 25 MG tablet [id number] Yes Take 25 mg by mouth 2 (two) times daily. [provider] Taking Active Self  methocarbamol (ROBAXIN) 500 MG tablet [id number] Yes Take 1 tablet (500 mg total) by mouth every 6 (six) hours as needed for muscle spasms.  Patient taking differently:  Take 500 mg  by mouth every 6 (six) hours as needed for muscle spasms. Takes at bedtime only.   [name], MD Taking Active   metoprolol succinate (TOPROL-XL) 25 MG 24 hr tablet [id number] Yes Take 1 tablet (25 mg total) by mouth daily. [name], [name], MD Taking Active Self  Multiple Vitamins-Minerals (PRESERVISION AREDS 2 PO) [id number] Yes Take 1 capsule by mouth 2 (two) times daily. [provider] Taking Active Self  nitroGLYCERIN (NITROSTAT) 0.4 MG SL  tablet [id number] Yes Place 0.4 mg under the tongue every 5 (five) minutes as needed for chest pain.  [provider] Taking Active Self  omeprazole (PRILOSEC) 20 MG capsule [id number] Yes Take 1 capsule (20 mg total) by mouth daily.  Patient taking differently:  Take 20 mg by mouth at bedtime.    [name], [name], MD Taking Active Self  ramipril (ALTACE) 10 MG capsule [id number] Yes Take 1 capsule (10 mg total) by mouth every evening. [name], [name], MD Taking Active   RELION INSULIN SYRINGE 1ML/31G 31G X 5/16" 1 ML MISC [id number] Yes Use as directed [name], [name], MD Taking Active Self  rOPINIRole (REQUIP) 1 MG tablet [id number] Yes Take 1 tablet (1 mg total) by mouth at bedtime. [name], [name], MD Taking Active Self  sertraline (ZOLOFT) 100 MG tablet [id number] Yes Take 1 tablet (100 mg total) by mouth every evening.  Patient taking differently:  Take 50 mg by mouth every evening.    [name], [name], MD Taking Active Self  verapamil (CALAN) 120 MG tablet [id number] Yes Take 120 mg by mouth daily as needed (afib).  [provider] Taking Active Self  verapamil (VERELAN PM) 240 MG 24 hr capsule [id number] Yes Take 1 capsule (240 mg total) by mouth daily. Juliet Rude, MD Taking Active Self          Assessment:  Date Discharged from Hospital: 11/14/2107 Date Medication Reconciliation Performed: 11/16/2017  New Medications at Discharge:   Methocarbamol  Hydrocodone/APAP  Patient was recently discharged from hospital and all medications have been reviewed.  Drugs sorted by system:  Neurologic/Psychologic: ropinirole, sertraline  Cardiovascular: aspirin, digoxin, ezetimibe, metoprolol succinate, nitroglycerin, ramipril, verapamil  Gastrointestinal: omeprazole  Endocrine: insulin glargine  Pain: acetaminophen, hydrocodone/APAP  Vitamins/Minerals/Supplements: calcium carbonate/Vitamin D, cholecalciferol, cranberry, MVI  Miscellaneous: meclizine,  methocarbamol  Medication Review Findings:  . Patient with diabetes mellitus and not on statin therapy.    Per the Beers List, methocarbamol may be poorly tolerated by older adults.  Muscle relaxants may have anticholinergic adverse effects, sedation, increased risk of fractures and effectiveness at dosages tolerated by older adults is questionable.  There is strong evidence to avoid use in the elderly   Per the Beers List, meclizine is highly anticholinergic and clearance is reduced with advanced age. Risk of confusion, dry mouth, constipation and other anticholinergic effects or toxicity may occur.  There is strong evidence to avoid use in the elderly.   Sertraline-patient reports that she decreased her dose to 50 mg daily, due to perceived increased drowsiness.   She states that her PCP is unaware of dose change.    Metformin- patient reports that she would like to reintroduce metformin back into her regimen.   Recommend 500 mg XL once daily with breakfast for 3 weeks then increase to 500 mg XL BID with meals and slowly increase dose as tolerated.   Her prior dose was 1000 mg XL twice daily.  Plan: Route note to PCP, Dr. [name] and see if she  agreeable to patient resuming metformin.   [facility] diabetes health coach referral.  Joetta Manners, [place] [phone]

## 2017-11-16 NOTE — Telephone Encounter (Signed)
Prescription for Zetia 10 mg was sent to her pharmacy. Thanks, BJ

## 2017-11-21 ENCOUNTER — Telehealth: Payer: Self-pay | Admitting: Family Medicine

## 2017-11-21 NOTE — Telephone Encounter (Signed)
It is okay to give verbal authorization for OT as requested. Thanks, BJ

## 2017-11-21 NOTE — Telephone Encounter (Signed)
Left message for [name] to return call to office for verbal orders.

## 2017-11-21 NOTE — Telephone Encounter (Signed)
Copied from [place]. Topic: Quick Communication - See Telephone Encounter >> Nov 21, 2017  9:28 AM Adelene Idler wrote: Darnell Level from [place] calling for verbal orders for occupational therapy 2 x week for 3 weeks  Cb# [phone]

## 2017-11-21 NOTE — Telephone Encounter (Signed)
Message sent to Dr. Jordan for review and approval. 

## 2017-11-21 NOTE — Telephone Encounter (Signed)
Dr. Jordan patient 

## 2017-11-22 ENCOUNTER — Telehealth: Payer: Self-pay | Admitting: Family Medicine

## 2017-11-22 ENCOUNTER — Other Ambulatory Visit: Payer: Self-pay

## 2017-11-22 ENCOUNTER — Ambulatory Visit: Payer: Self-pay

## 2017-11-22 NOTE — Patient Outreach (Signed)
[place] [facility]) Care Management  11/22/2017  [name] 25-Jun-1940 [id number]  Call placed to Dr. Doug Sou office.  Patient reports her CBGs have been running high and she would like to resume her metformin, which was discontinued about a month ago due to diarrhea..    Recommend: Metformin 500 mg XL daily with a meal and titrate up as tolerated.   Plan: Await return call from PCP, Dr. [name].  Joetta Manners, PharmD Clinical Pharmacist [place] [phone]

## 2017-11-22 NOTE — Telephone Encounter (Signed)
Copied from [place] [id number]. Topic: Quick Communication - See Telephone Encounter >> Nov 22, 2017  9:54 AM Sheran Luz wrote: CRM for notification. See Telephone encounter for: 11/22/17.  Anderson Malta, pharmacist with [facility] calling on behalf of pt stating that pt was advised to stop taking metFORMIN in the past, Anderson Malta states pt would like to restart MetFORMIN due to her blood sugar being "higher than normal" (pt did not disclose to [place] how high) and would like to know if Dr.Jordan thought it was appropriate. Please advise.

## 2017-11-23 NOTE — Telephone Encounter (Signed)
Message sent to Dr. Jordan for review and approval. 

## 2017-11-23 NOTE — Telephone Encounter (Signed)
Verbal orders for OT given as requested.

## 2017-11-26 NOTE — Telephone Encounter (Signed)
Metformin was discontinued because she was complaining about diarrhea, she reported improvement after medication was stopped. I like Metformin , so if she wants to resume it, she can do so. No changes on rest of meds.  Recommend continue following with endocrinologist for DM 2, she has seen Dr Buddy Duty at [facility]. Thanks, BJ

## 2017-11-27 ENCOUNTER — Other Ambulatory Visit: Payer: Self-pay

## 2017-11-27 ENCOUNTER — Other Ambulatory Visit: Payer: Self-pay | Admitting: Family Medicine

## 2017-11-27 ENCOUNTER — Ambulatory Visit: Payer: Self-pay

## 2017-11-27 DIAGNOSIS — F330 Major depressive disorder, recurrent, mild: Secondary | ICD-10-CM

## 2017-11-27 MED ORDER — METFORMIN HCL ER 500 MG PO TB24: 500.0000 mg | ORAL_TABLET | Freq: Every day | ORAL | 0 refills | Status: DC

## 2017-11-27 NOTE — Telephone Encounter (Signed)
Left message to call back concerning medication.

## 2017-11-27 NOTE — Telephone Encounter (Signed)
Spoke with patient and she agrees with taking Metformin, but would like the dosage to be less than 2000 mg daily. She also agrees with seeing an endocrinologist but does not want to see Dr Buddy Duty.  Please advise.

## 2017-11-27 NOTE — Telephone Encounter (Signed)
Prescription for Metformin XR 500 mg to take 1 daily with breakfast was sent to her pharmacy. If she needs referral to see another endocrinologist, this can be placed.  If she does not need referral, recommend arrange an appointment with a new endocrinologist for DM2 management. Thanks, BJ

## 2017-11-27 NOTE — Patient Outreach (Signed)
[place] [facility]) Care Management  11/27/2017  DAMIAH MCDONALD [birth year] [id number]  Per note from Dr. [name] on 11/22/17, MD is fine with patient resuming metformin.   In-basket message sent requesting that prescription be called into [facility] pharmacy for metformin 500 mg XL once daily with food.    Joetta Manners, PharmD Clinical Pharmacist [place] [phone]  Addendum: Note from Dr. [name] that metformin prescription had been called into [facility].    Outreach call placed to Ms. Kuper and HIPAA identifiers verified.   Informed her that metformin prescription was called in for her today.  Informed her to take it daily with a meal to minimize diarrhea and that it should resolve with time.  She verbalized understanding.   Plan: Outreach to Ms. Brien in 2 weeks to see how she is tolerating metformin.  Joetta Manners, PharmD Clinical Pharmacist [place] [phone]

## 2017-11-28 ENCOUNTER — Other Ambulatory Visit: Payer: Self-pay | Admitting: *Deleted

## 2017-11-28 DIAGNOSIS — E1140 Type 2 diabetes mellitus with diabetic neuropathy, unspecified: Secondary | ICD-10-CM

## 2017-11-28 DIAGNOSIS — Z794 Long term (current) use of insulin: Secondary | ICD-10-CM

## 2017-11-28 DIAGNOSIS — E119 Type 2 diabetes mellitus without complications: Secondary | ICD-10-CM

## 2017-11-28 NOTE — Telephone Encounter (Signed)
Referral to Endocrinology placed.

## 2017-12-03 ENCOUNTER — Ambulatory Visit (HOSPITAL_COMMUNITY)
Admission: RE | Admit: 2017-12-03 | Discharge: 2017-12-03 | Disposition: A | Discharge status: Home / Self Care | Payer: PPO | Source: Ambulatory Visit | Attending: Neurological Surgery | Admitting: Neurological Surgery

## 2017-12-03 DIAGNOSIS — M4802 Spinal stenosis, cervical region: Secondary | ICD-10-CM | POA: Diagnosis not present

## 2017-12-03 DIAGNOSIS — M5412 Radiculopathy, cervical region: Secondary | ICD-10-CM | POA: Diagnosis not present

## 2017-12-03 DIAGNOSIS — Z981 Arthrodesis status: Secondary | ICD-10-CM | POA: Diagnosis not present

## 2017-12-04 ENCOUNTER — Other Ambulatory Visit: Payer: Self-pay | Admitting: Family Medicine

## 2017-12-04 DIAGNOSIS — H8110 Benign paroxysmal vertigo, unspecified ear: Secondary | ICD-10-CM

## 2017-12-11 ENCOUNTER — Other Ambulatory Visit: Payer: Self-pay

## 2017-12-11 ENCOUNTER — Ambulatory Visit: Payer: Self-pay

## 2017-12-11 NOTE — Patient Outreach (Signed)
[facility]) Care Management  [place]  12/11/2017  MELESA LECY [birth year] [id number]  77 year old female outreached by [facility] services for a 30 day post discharge medication review.  PMHx includes, but not limited to, paroxysmal atrial tachycardia, hypertension, GERD, type 2 diabetes mellitus, depression, fibromyalgia and hyperlipidemia.  Unsuccessful telephone call attempt # 1 to patient. HIPAA compliant voicemail left requesting a return call.  Plan:  I will make another outreach attempt to patient within 3-4 business days to follow up on how she is tolerating resuming metformin.  See if dosage can be increased.   Joetta Manners, PharmD Clinical Pharmacist [place] [phone]

## 2017-12-13 ENCOUNTER — Other Ambulatory Visit: Payer: Self-pay

## 2017-12-13 NOTE — Patient Outreach (Signed)
[name]) Care Management  12/13/2017  [name] 09/10/1940 [id number]    1st unsuccessful outreach attempt to the patient for initial assessment.  No answer. HIPAA compliant voice mail with contact information.  Plan: Rn health Coach will make outreach attempt the patient within in one month.  Lazaro Arms RN, BSN, [place] Direct Dial:  [phone]  Fax: [phone]

## 2017-12-14 ENCOUNTER — Ambulatory Visit: Payer: Self-pay

## 2017-12-17 ENCOUNTER — Other Ambulatory Visit: Payer: Self-pay | Admitting: Cardiology

## 2017-12-17 DIAGNOSIS — I10 Essential (primary) hypertension: Secondary | ICD-10-CM

## 2017-12-17 NOTE — Telephone Encounter (Signed)
° °  1. Which medications need to be refilled? (please list name of each medication and dose if known) metoprolol succ ER 25mg  tablet 1QD  2. Which pharmacy/location (including street and city if local pharmacy) is medication to be sent to? [facility] [phone]  3. Do they need a 30 day or 90 day supply? [place]

## 2017-12-18 ENCOUNTER — Ambulatory Visit: Payer: Self-pay

## 2017-12-18 ENCOUNTER — Telehealth: Payer: Self-pay | Admitting: Family Medicine

## 2017-12-18 ENCOUNTER — Other Ambulatory Visit: Payer: Self-pay

## 2017-12-18 MED ORDER — METOPROLOL SUCCINATE ER 25 MG PO TB24: 25.0000 mg | ORAL_TABLET | Freq: Every day | ORAL | 0 refills | Status: DC

## 2017-12-18 NOTE — Patient Outreach (Signed)
[facility]) Care Management  12/18/2017  [name] Jul 02, 1940 [id number]  Successful outreach to [name].  HIPAA identifiers verified.  [name] report that she is still having some diarrhea from the metformin being resumed.  She states that she takes this medication around lunch when she has her fullest stomach.  She reports that her glucose is running in the 150s and is aware that she wants it lower.  She reports that she is getting a referral to endocrinology.   Patient reports that she had no further questions or concerns at this time.  She states that she will discuss increasing her metformin to twice daily dosing once her stomach is more adjusted and she will discuss when she sees endocrinology.  [name], PharmD Clinical Pharmacist [place] [phone]

## 2017-12-18 NOTE — Telephone Encounter (Signed)
Metoprolol succinate 25 mg daily refilled per Dr. Agustin Cree

## 2017-12-18 NOTE — Telephone Encounter (Signed)
Copied from [place] [phone]. Topic: General - Other >> Dec 18, 2017  2:38 PM Cecelia Byars, NT wrote: Reason for CRM: Lelan Pons the pharmacist from [facility] called and would like a call back at [phone]  to do a check the patients med list ,when calling please refer to  case # [id number] .

## 2017-12-20 ENCOUNTER — Encounter: Payer: Self-pay | Admitting: *Deleted

## 2017-12-20 NOTE — Telephone Encounter (Signed)
Left message to return call concerning patient. 

## 2017-12-21 ENCOUNTER — Encounter: Payer: Self-pay | Admitting: Diagnostic Neuroimaging

## 2017-12-21 ENCOUNTER — Telehealth: Payer: Self-pay | Admitting: Diagnostic Neuroimaging

## 2017-12-21 ENCOUNTER — Ambulatory Visit (INDEPENDENT_AMBULATORY_CARE_PROVIDER_SITE_OTHER): Payer: PPO | Admitting: Diagnostic Neuroimaging

## 2017-12-21 VITALS — BP 130/84 | HR 70 | Ht 63.5 in | Wt 208.0 lb

## 2017-12-21 DIAGNOSIS — G43109 Migraine with aura, not intractable, without status migrainosus: Secondary | ICD-10-CM | POA: Diagnosis not present

## 2017-12-21 DIAGNOSIS — G43809 Other migraine, not intractable, without status migrainosus: Secondary | ICD-10-CM

## 2017-12-21 DIAGNOSIS — H814 Vertigo of central origin: Secondary | ICD-10-CM

## 2017-12-21 DIAGNOSIS — G450 Vertebro-basilar artery syndrome: Secondary | ICD-10-CM | POA: Diagnosis not present

## 2017-12-21 NOTE — Telephone Encounter (Signed)
Health team order sent to GI. No auth they will reach out to the pt to schedule.  °

## 2017-12-21 NOTE — Patient Instructions (Signed)
  DIZZINESS ATTACKS (TIA vs migraine vs panic attack) - MRI brain and IAC - MRA head / neck - echocardiogram - continue aspirin, zetia, diabetes control, BP control - vestibular and physical therapy; use cane
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
## 2017-12-21 NOTE — Telephone Encounter (Signed)
Caller name: Lelan Pons  Relation to pt: [employer] Medicare Part D Call back number:  [phone]    Reason for call:  Medication  clarity before sending out current med list.

## 2017-12-24 NOTE — Telephone Encounter (Signed)
[name] left a vmail on 12/24/17 for pt to call back about scheduling images.

## 2017-12-24 NOTE — Telephone Encounter (Signed)
Left message to return call to clinic. 

## 2017-12-25 ENCOUNTER — Other Ambulatory Visit: Payer: Self-pay

## 2017-12-25 ENCOUNTER — Ambulatory Visit (HOSPITAL_COMMUNITY): Payer: PPO | Attending: Cardiology

## 2017-12-25 ENCOUNTER — Other Ambulatory Visit: Payer: Self-pay | Admitting: Family Medicine

## 2017-12-25 DIAGNOSIS — G459 Transient cerebral ischemic attack, unspecified: Secondary | ICD-10-CM | POA: Insufficient documentation

## 2017-12-25 DIAGNOSIS — I10 Essential (primary) hypertension: Secondary | ICD-10-CM | POA: Insufficient documentation

## 2017-12-25 DIAGNOSIS — E785 Hyperlipidemia, unspecified: Secondary | ICD-10-CM | POA: Insufficient documentation

## 2017-12-25 DIAGNOSIS — F330 Major depressive disorder, recurrent, mild: Secondary | ICD-10-CM

## 2017-12-25 DIAGNOSIS — E669 Obesity, unspecified: Secondary | ICD-10-CM | POA: Insufficient documentation

## 2017-12-25 DIAGNOSIS — G450 Vertebro-basilar artery syndrome: Secondary | ICD-10-CM | POA: Diagnosis not present

## 2017-12-25 DIAGNOSIS — E119 Type 2 diabetes mellitus without complications: Secondary | ICD-10-CM | POA: Diagnosis not present

## 2017-12-25 DIAGNOSIS — I471 Supraventricular tachycardia: Secondary | ICD-10-CM | POA: Diagnosis not present

## 2017-12-25 DIAGNOSIS — G2581 Restless legs syndrome: Secondary | ICD-10-CM

## 2017-12-25 NOTE — Telephone Encounter (Signed)
Left message to give clinic a call back concerning patient.

## 2017-12-25 NOTE — Telephone Encounter (Signed)
Spoke with [name], confirmed medications and allergies as requested. No further assistance needed at this time.

## 2017-12-28 ENCOUNTER — Ambulatory Visit (INDEPENDENT_AMBULATORY_CARE_PROVIDER_SITE_OTHER): Payer: PPO | Admitting: Internal Medicine

## 2017-12-28 ENCOUNTER — Encounter: Payer: Self-pay | Admitting: Internal Medicine

## 2017-12-28 VITALS — BP 128/78 | HR 71 | Resp 16 | Ht 63.5 in | Wt 208.0 lb

## 2017-12-28 DIAGNOSIS — Z794 Long term (current) use of insulin: Secondary | ICD-10-CM | POA: Diagnosis not present

## 2017-12-28 DIAGNOSIS — F4321 Adjustment disorder with depressed mood: Secondary | ICD-10-CM | POA: Diagnosis not present

## 2017-12-28 DIAGNOSIS — E1142 Type 2 diabetes mellitus with diabetic polyneuropathy: Secondary | ICD-10-CM

## 2017-12-28 MED ORDER — INSULIN PEN NEEDLE 32G X 6 MM MISC: 6 refills | Status: DC

## 2017-12-28 MED ORDER — LIRAGLUTIDE 18 MG/3ML ~~LOC~~ SOPN: 0.6000 mg | PEN_INJECTOR | Freq: Every day | SUBCUTANEOUS | 6 refills | Status: DC

## 2017-12-28 NOTE — Patient Instructions (Addendum)
-   Check sugar twice a day (fasting /bedtime) - Continue Basaglar 65 units daily  - Start Victoza 0.6 mg daily

## 2017-12-28 NOTE — Progress Notes (Signed)
Name: [name]  MRN/ DOB: [id number], 1940-08-21   Age/ Sex: 77 y.o., female    PCP: [name], [name], MD   Reason for Endocrinology Evaluation: Type 2 Diabetes Mellitus     Date of Initial Endocrinology Visit: 12/28/2017     PATIENT IDENTIFIER: [name] is a 77 y.o. female with a past medical history of PAT,T2DM, urinary incontinency, . The patient presented for initial ndocrinology clinic visit on 12/28/2017 for consultative assistance with her diabetes management.    HPI: Ms. Lienemann is here with friend Thayer Headings    Diagnosed with T2DM > 10 yrs ago  Prior Medications tried: Metformin - fecal incontinence on 2000 mg . Tried 500 mg last week and had 6 episodes of diarrhea last week  Hypoglycemia episodes : 0             Hemoglobin A1c has ranged from 6.7% in 08/2017, peaking at 7.9% in 2011. Currently checking blood sugars 1 x / day,  before breakfast .  Patient required assistance for hypoglycemia: 0 Patient has required hospitalization within the last 1 year from hyper or hypoglycemia: 0  In terms of diet, the patient avoids sugar-sweetened beverages, rarely drinks black cherry juice. She eats 5 meals a day because she is hungry.  Pt lost her husband March, 2018 and has not been able to get over it.  This year she has had a spinal and a knee surgery.     HOME DIABETES REGIMEN: Basaglar 65 units QHS Metformin - Intolerant.    Statin: On Zetia  ACE-I/ARB: yes Prior Diabetic Education: Yes   METER DOWNLOAD SUMMARY: Date range evaluated: 10/17-11/15/19 Fingerstick Blood Glucose Tests = 21 Overall Mean FS Glucose = 162 Standard Deviation =32.8  BG Ranges: Low = 132 High = 257   Hypoglycemic Events/30 Days: BG < 50 = 0 Episodes of symptomatic severe hypoglycemia = 0   DIABETIC COMPLICATIONS: Microvascular complications:   Neuropathy   Denies: retinopathy, nephropathy   Last eye exam: Completed 2019  Macrovascular complications:    Denies:  CAD, PVD, CVA   PAST HISTORY: Past Medical History:  Past Medical History:  Diagnosis Date  . Anemia    during 1st pregnancy  . Anxiety   . Arthritis   . Complication of anesthesia   . Depression   . Diarrhea, functional   . Dysrhythmia    Paroysmal Atrial Tachycardia  . Essential hypertension   . Fibromyalgia   . GERD (gastroesophageal reflux disease)   . H/O acute pancreatitis   . Headache   . History of hiatal hernia   . Hyperlipemia   . Osteoporosis   . PAT (paroxysmal atrial tachycardia) ([place])   . Pneumonia   . PONV (postoperative nausea and vomiting)   . PSVT (paroxysmal supraventricular tachycardia) ([place])   . Reflux   . Restless leg syndrome   . Type 2 diabetes mellitus (HCC)    Type II  . Vertigo    Past Surgical History:  Past Surgical History:  Procedure Laterality Date  . ABDOMINAL HYSTERECTOMY    . ANKLE SURGERY    . ANTERIOR CERVICAL DECOMP/DISCECTOMY FUSION N/A 11/12/2017   Procedure: Cervical seven to Thoracic one  Anterior cervical decompression/discectomy/fusion with removal of old plate;  Surgeon: [name], MD;  Location: [place];  Service: Neurosurgery;  Laterality: N/A;  . BACK SURGERY     T12-L1 fusion, Anterior Cervical fusion  . CARDIAC CATHETERIZATION N/A 02/16/2015   Procedure: Left Heart Cath and Coronary  Angiography;  Surgeon: Sherren Mocha, MD;  Location: [place];  Service: Cardiovascular;  Laterality: N/A;  . CHOLECYSTECTOMY    . COLONOSCOPY    . ESOPHAGOGASTRODUODENOSCOPY  12/21/2010   Procedure: ESOPHAGOGASTRODUODENOSCOPY (EGD);  Surgeon: Landry Dyke, MD;  Location: [facility];  Service: Endoscopy;  Laterality: N/A;  . EXCISION MORTON'S NEUROMA    . EYE SURGERY Bilateral    Cataract  . FRACTURE SURGERY     ankle x 2   . KNEE SURGERY     x 3  . NECK SURGERY    . ROTATOR CUFF REPAIR    . TONSILLECTOMY    . TOTAL KNEE ARTHROPLASTY Left 04/10/2017   Procedure: LEFT TOTAL KNEE ARTHROPLASTY;  Surgeon: Paralee Cancel,  MD;  Location: [facility];  Service: Orthopedics;  Laterality: Left;  70 mins  . UPPER GASTROINTESTINAL ENDOSCOPY    . WRIST SURGERY        Social History:  reports that she has quit smoking. She has never used smokeless tobacco. She reports that she does not drink alcohol or use drugs. Family History:  Family History  Problem Relation Age of Onset  . Heart failure Mother   . Hyperlipidemia Mother   . Lung cancer Father        Lymphoma  . Hyperlipidemia Maternal Grandmother   . Peripheral vascular disease Maternal Grandmother      HOME MEDICATIONS: Allergies as of 12/28/2017      Reactions   Amoxil [amoxicillin] Swelling, Other (See Comments)   Lips Has patient had a PCN reaction causing immediate rash, facial/tongue/throat swelling, SOB or lightheadedness with hypotension:YES Has patient had a PCN reaction causing severe rash involving mucus membranes or skin necrosis:No Has patient had a PCN reaction that required hospitalization:No Has patient had a PCN reaction occurring within the last 10 years:Yes. If all of the above answers are "NO", then may proceed with Cephalosporin use.   Chocolate Hives   Demerol Nausea And Vomiting   Lisinopril Itching, Swelling   Meperidine    Other reaction(s): Vomiting (intolerance)   Meperidine Hcl    Other reaction(s): Vomiting (intolerance)   Other    Raw food or nuts - GI pain      Medication List        Accurate as of 12/28/17  1:10 PM. Always use your most recent med list.          acetaminophen 325 MG tablet Commonly known as:  TYLENOL Take 650 mg by mouth 2 (two) times daily as needed for moderate pain.   aspirin EC 81 MG tablet Take 81 mg by mouth every evening.   BASAGLAR KWIKPEN 100 UNIT/ML Sopn Inject 0.6 mLs (60 Units total) into the skin daily.   CALCIUM 600+D3 PO Take 1 tablet by mouth every evening.   cholecalciferol 1000 units tablet Commonly known as:  VITAMIN D Take 1,000 Units by mouth daily.     Cranberry 1000 MG Caps Take 1,000 mg by mouth 2 (two) times daily.   digoxin 0.125 MG tablet Commonly known as:  LANOXIN Take 0.125 mg by mouth daily.   ezetimibe 10 MG tablet Commonly known as:  ZETIA Take 1 tablet (10 mg total) by mouth daily.   meclizine 25 MG tablet Commonly known as:  ANTIVERT Take 12.5 mg by mouth at bedtime.   metFORMIN 500 MG 24 hr tablet Commonly known as:  GLUCOPHAGE-XR Take 1 tablet (500 mg total) by mouth daily with breakfast.   methocarbamol 500 MG  tablet Commonly known as:  ROBAXIN Take 1 tablet (500 mg total) by mouth every 6 (six) hours as needed for muscle spasms.   metoprolol succinate 25 MG 24 hr tablet Commonly known as:  TOPROL-XL Take 1 tablet (25 mg total) by mouth daily.   nitroGLYCERIN 0.4 MG SL tablet Commonly known as:  NITROSTAT Place 0.4 mg under the tongue every 5 (five) minutes as needed for chest pain.   omeprazole 20 MG capsule Commonly known as:  PRILOSEC Take 1 capsule (20 mg total) by mouth daily.   PRESERVISION AREDS 2 PO Take 1 capsule by mouth 2 (two) times daily.   ramipril 10 MG capsule Commonly known as:  ALTACE Take 1 capsule (10 mg total) by mouth every evening.   RELION INSULIN SYRINGE 1ML/31G 31G X 5/16" 1 ML Misc Generic drug:  Insulin Syringe-Needle U-100 Use as directed   rOPINIRole 1 MG tablet Commonly known as:  REQUIP TAKE ONE TABLET BY MOUTH AT BEDTIME.   sertraline 100 MG tablet Commonly known as:  ZOLOFT Take 1 tablet (100 mg total) by mouth every evening.   verapamil 120 MG tablet Commonly known as:  CALAN Take 120 mg by mouth daily as needed (afib).   verapamil 240 MG 24 hr capsule Commonly known as:  VERELAN PM Take 1 capsule (240 mg total) by mouth daily.        ALLERGIES: Allergies  Allergen Reactions  . Amoxil [Amoxicillin] Swelling and Other (See Comments)    Lips Has patient had a PCN reaction causing immediate rash, facial/tongue/throat swelling, SOB or  lightheadedness with hypotension:YES Has patient had a PCN reaction causing severe rash involving mucus membranes or skin necrosis:No Has patient had a PCN reaction that required hospitalization:No Has patient had a PCN reaction occurring within the last 10 years:Yes. If all of the above answers are "NO", then may proceed with Cephalosporin use.   . Chocolate Hives  . Demerol Nausea And Vomiting  . Lisinopril Itching and Swelling  . Meperidine     Other reaction(s): Vomiting (intolerance)  . Meperidine Hcl     Other reaction(s): Vomiting (intolerance)  . Other     Raw food or nuts - GI pain     REVIEW OF SYSTEMS: A comprehensive ROS was conducted with the patient and is negative except as per HPI and below:  Review of Systems  Constitutional: Positive for malaise/fatigue. Negative for weight loss.  HENT: Negative for congestion and sore throat.   Eyes: Negative for blurred vision and pain.  Respiratory: Negative for cough and shortness of breath.   Gastrointestinal: Positive for diarrhea. Negative for nausea.  Genitourinary: Positive for frequency.  Musculoskeletal: Positive for neck pain.  Skin: Negative for rash.  Neurological: Positive for dizziness and tingling.  Endo/Heme/Allergies: Negative for polydipsia.  Psychiatric/Behavioral: Positive for depression. The patient is nervous/anxious.       OBJECTIVE:   VITAL SIGNS: BP 128/78   Pulse 71   Resp 16   Ht 5' 3.5" (1.613 m)   Wt 208 lb (94.3 kg)   SpO2 97%   BMI 36.27 kg/m    PHYSICAL EXAM:  General: Pt appears well, NAD   Hydration: Well-hydrated with moist mucous membranes and good skin turgor  HEENT: Head: Unremarkable with good dentition. Oropharynx clear without exudate.  Eyes: External eye exam normal without stare, lid lag or exophthalmos.  EOM intact.    Neck: General: Supple without adenopathy or carotid bruits. Thyroid: Thyroid size normal.  No goiter or nodules  appreciated. No thyroid bruit.  Lungs:  Clear with good BS bilat with no rales, rhonchi, or wheezes  Heart: RRR with normal S1 and S2 and no gallops; no murmurs; no rub  Abdomen: Normoactive bowel sounds, soft, nontender, without masses or organomegaly palpable  Extremities:  Lower extremities - No pretibial edema. No lesions.  Skin: Normal texture and temperature to palpation. No rash noted. No Acanthosis nigricans/skin tags.  Neuro: MS is good with appropriate affect, pt is alert and Ox3     DATA REVIEWED:  Lab Results  Component Value Date   HGBA1C 7.6 (H) 11/12/2017   HGBA1C 6.7 (H) 09/04/2017   HGBA1C 6.8 (H) 04/05/2017   Lab Results  Component Value Date   MICROALBUR 26.1 (H) 01/12/2017   LDLCALC 82 01/20/2016   CREATININE 0.94 11/12/2017           ASSESSMENT / PLAN / RECOMMENDATIONS:   1) Type 2 Diabetes Mellitus, Sub-optimally controlled, With microvascular complications - Most recent A1c of 7.6% %. Goal A1c < 7.0 %.   Plan: GENERAL: - I have discussed with the patient the pathophysiology of diabetes. We went over the natural progression of the disease. We talked about both insulin resistance,insulin deficiency. We stressed the importance of lifestyle changes including diet and exercise. I explained the complications associated with diabetes including retinopathy, nephropathy, neuropathy as well as increased risk of cardiovascular disease.   - We discussed avoidance of snacks and sugar-sweetened beverages.  - Major barrier at this time is depression/anxiety.  - She is sensitive to a lot of medications, she can't eat raw vegetables.  - She has urinary frequency that interferes with her sleep at night, so SGLT-2 inhibitors will not be advised.  - Sulfonylureas would be a secondary option following a trial of GLP-1 agonists . We discussed diarrhea and abdominal pain, or nausea with Victoza (she has tried it in the past, unclear why she stopped) - We discussed weight gain with SU.  - Pt advised to hold off on  starting victoza until a week to make sure diarrhea has resolved.  - She was encouraged to call us with any side effects    MEDICATIONS:  Stop Metformin   Continue Basaglar 65 units QHS  Victoza 0.6 mg daily   EDUCATION / INSTRUCTIONS:  BG monitoring instructions: Patient is instructed to check her blood sugars 2 times a day, fasting and bedtime.  Call [facility] if: BG persistently < 70 or > 300. . I reviewed the Rule of 15 for the treatment of hypoglycemia in detail with the patient. Literature supplied.   2) Diabetic complications:   Eye: Does not have known diabetic retinopathy. Last eye exam was   Neuro/ Feet: Does have known diabetic peripheral neuropathy.  Renal: Patient does not have known baseline CKD. She is on an ACEI/ARB at present.   3) Lipids: Patient is on Zetia .    4) Hypertension: She is  at goal of < 140/90 mmHg.    5) Depression with prolonged grief reactions: - Pt is depressed, has not been able to get over spouse passing in March, 2018 - She needs to exercise but is limited due to physical issues and lack of motivation.  - I have advised her to seek counseling.   F/U in 4 weeks    Signed electronically by: Mack Guise, MD  [facility]  [facility] [address] [place], [place] [address] Phone: [phone] FAX: [phone]  CC: [name], [name], [place] [address] Phone: [phone]  Fax: [phone]    Return to Endocrinology clinic as below: Future Appointments  Date Time Provider [place]  01/03/2018  2:00 PM Lazaro Arms, RN [facility] None  01/12/2018  1:00 PM [facility]-315 MR 1 [facility]-315MRI [facility]  01/12/2018  2:00 PM GI-315 MR 1 GI-315MRI [facility]  01/12/2018  2:20 PM GI-315 MR 1 GI-315MRI [facility]  06/24/2018  2:00 PM Penumalli, Earlean Polka, MD [facility] None  09/10/2018  3:00 PM Wynetta Fines, RN [facility]

## 2018-01-03 ENCOUNTER — Other Ambulatory Visit: Payer: Self-pay

## 2018-01-03 NOTE — Patient Outreach (Signed)
[facility]) Care Management  01/03/2018  UCHECHI DENISON 1940-05-25 [id number]    2nd attempt to outreach the patient for initial assessment.  The patient stated that she was at a restaurant at the time and was unable to talk.  Plan: [facility] will make outreach attempt the patient within in one month.  Lazaro Arms RN, BSN, [place] Direct Dial:  [phone]  Fax: [phone]

## 2018-01-07 ENCOUNTER — Telehealth: Payer: Self-pay | Admitting: *Deleted

## 2018-01-07 NOTE — Telephone Encounter (Signed)
LVM informing the patient that her echocardiogram results are unremarkable. Dr Leta Baptist will see what her MRI, MRA results are after she has those scans later this week. Left number for any questions.

## 2018-01-11 ENCOUNTER — Other Ambulatory Visit: Payer: Self-pay | Admitting: Cardiology

## 2018-01-11 DIAGNOSIS — I10 Essential (primary) hypertension: Secondary | ICD-10-CM

## 2018-01-12 ENCOUNTER — Ambulatory Visit
Admission: RE | Admit: 2018-01-12 | Discharge: 2018-01-12 | Disposition: A | Discharge status: Home / Self Care | Payer: PPO | Source: Ambulatory Visit | Attending: Diagnostic Neuroimaging | Admitting: Diagnostic Neuroimaging

## 2018-01-12 DIAGNOSIS — H814 Vertigo of central origin: Secondary | ICD-10-CM

## 2018-01-12 DIAGNOSIS — G450 Vertebro-basilar artery syndrome: Secondary | ICD-10-CM

## 2018-01-12 MED ORDER — GADOBENATE DIMEGLUMINE 529 MG/ML IV SOLN
19.0000 mL | Freq: Once | INTRAVENOUS | Status: AC | PRN
  Administered 2018-01-12: 19 mL via INTRAVENOUS

## 2018-01-16 ENCOUNTER — Telehealth: Payer: Self-pay | Admitting: *Deleted

## 2018-01-16 NOTE — Telephone Encounter (Signed)
LVM requesting call back.

## 2018-01-16 NOTE — Telephone Encounter (Signed)
LVM requesting patient call back after lunch time for tests results.

## 2018-01-16 NOTE — Telephone Encounter (Signed)
Pt returning RN's call.

## 2018-01-18 ENCOUNTER — Telehealth: Payer: Self-pay | Admitting: Family Medicine

## 2018-01-18 ENCOUNTER — Other Ambulatory Visit: Payer: Self-pay | Admitting: Family Medicine

## 2018-01-18 DIAGNOSIS — F330 Major depressive disorder, recurrent, mild: Secondary | ICD-10-CM

## 2018-01-18 DIAGNOSIS — E785 Hyperlipidemia, unspecified: Secondary | ICD-10-CM

## 2018-01-18 NOTE — Addendum Note (Signed)
Addended by: Zacarias Pontes on: 01/18/2018 01:28 PM   Modules accepted: Orders

## 2018-01-18 NOTE — Telephone Encounter (Addendum)
Spoke with patient and informed ehr that her MRI brain/IAC, MRA head/neck results are all unremarkable. Advised she continue to control diabetes, BP, do PT, vestibular therapy. She stated she has been very busy so [facility]. She stated she intends to go back more often as soon as she can. She  verbalized understanding, appreciation of call.

## 2018-01-21 ENCOUNTER — Inpatient Hospital Stay (HOSPITAL_COMMUNITY)
Admission: EM | Admit: 2018-01-21 | Discharge: 2018-01-29 | DRG: 028 | Disposition: A | Discharge status: Skilled Nursing Facility | Payer: PPO | Attending: Internal Medicine | Admitting: Internal Medicine

## 2018-01-21 ENCOUNTER — Emergency Department (HOSPITAL_COMMUNITY): Payer: PPO

## 2018-01-21 ENCOUNTER — Encounter (HOSPITAL_COMMUNITY): Payer: Self-pay | Admitting: Emergency Medicine

## 2018-01-21 ENCOUNTER — Other Ambulatory Visit: Payer: Self-pay

## 2018-01-21 DIAGNOSIS — S4992XA Unspecified injury of left shoulder and upper arm, initial encounter: Secondary | ICD-10-CM | POA: Diagnosis not present

## 2018-01-21 DIAGNOSIS — I110 Hypertensive heart disease with heart failure: Secondary | ICD-10-CM | POA: Diagnosis present

## 2018-01-21 DIAGNOSIS — R41841 Cognitive communication deficit: Secondary | ICD-10-CM | POA: Diagnosis not present

## 2018-01-21 DIAGNOSIS — G2581 Restless legs syndrome: Secondary | ICD-10-CM | POA: Diagnosis present

## 2018-01-21 DIAGNOSIS — R531 Weakness: Secondary | ICD-10-CM

## 2018-01-21 DIAGNOSIS — R51 Headache: Secondary | ICD-10-CM | POA: Diagnosis not present

## 2018-01-21 DIAGNOSIS — M4322 Fusion of spine, cervical region: Secondary | ICD-10-CM | POA: Diagnosis not present

## 2018-01-21 DIAGNOSIS — E785 Hyperlipidemia, unspecified: Secondary | ICD-10-CM | POA: Diagnosis present

## 2018-01-21 DIAGNOSIS — E1149 Type 2 diabetes mellitus with other diabetic neurological complication: Secondary | ICD-10-CM | POA: Diagnosis present

## 2018-01-21 DIAGNOSIS — M479 Spondylosis, unspecified: Secondary | ICD-10-CM | POA: Diagnosis present

## 2018-01-21 DIAGNOSIS — Z4789 Encounter for other orthopedic aftercare: Secondary | ICD-10-CM | POA: Diagnosis not present

## 2018-01-21 DIAGNOSIS — M810 Age-related osteoporosis without current pathological fracture: Secondary | ICD-10-CM | POA: Diagnosis present

## 2018-01-21 DIAGNOSIS — S12600A Unspecified displaced fracture of seventh cervical vertebra, initial encounter for closed fracture: Secondary | ICD-10-CM | POA: Diagnosis present

## 2018-01-21 DIAGNOSIS — Y92008 Other place in unspecified non-institutional (private) residence as the place of occurrence of the external cause: Secondary | ICD-10-CM | POA: Diagnosis not present

## 2018-01-21 DIAGNOSIS — S12690A Other displaced fracture of seventh cervical vertebra, initial encounter for closed fracture: Secondary | ICD-10-CM | POA: Diagnosis not present

## 2018-01-21 DIAGNOSIS — E1140 Type 2 diabetes mellitus with diabetic neuropathy, unspecified: Secondary | ICD-10-CM | POA: Diagnosis present

## 2018-01-21 DIAGNOSIS — W19XXXA Unspecified fall, initial encounter: Secondary | ICD-10-CM | POA: Insufficient documentation

## 2018-01-21 DIAGNOSIS — M2550 Pain in unspecified joint: Secondary | ICD-10-CM | POA: Diagnosis not present

## 2018-01-21 DIAGNOSIS — Z8673 Personal history of transient ischemic attack (TIA), and cerebral infarction without residual deficits: Secondary | ICD-10-CM

## 2018-01-21 DIAGNOSIS — R52 Pain, unspecified: Secondary | ICD-10-CM | POA: Diagnosis not present

## 2018-01-21 DIAGNOSIS — E119 Type 2 diabetes mellitus without complications: Secondary | ICD-10-CM | POA: Diagnosis not present

## 2018-01-21 DIAGNOSIS — N390 Urinary tract infection, site not specified: Secondary | ICD-10-CM | POA: Diagnosis present

## 2018-01-21 DIAGNOSIS — S0990XA Unspecified injury of head, initial encounter: Secondary | ICD-10-CM | POA: Diagnosis not present

## 2018-01-21 DIAGNOSIS — Z87891 Personal history of nicotine dependence: Secondary | ICD-10-CM

## 2018-01-21 DIAGNOSIS — Z7401 Bed confinement status: Secondary | ICD-10-CM | POA: Diagnosis not present

## 2018-01-21 DIAGNOSIS — Z794 Long term (current) use of insulin: Secondary | ICD-10-CM

## 2018-01-21 DIAGNOSIS — R402143 Coma scale, eyes open, spontaneous, at hospital admission: Secondary | ICD-10-CM | POA: Diagnosis present

## 2018-01-21 DIAGNOSIS — M628 Other specified disorders of muscle: Secondary | ICD-10-CM | POA: Diagnosis not present

## 2018-01-21 DIAGNOSIS — G8220 Paraplegia, unspecified: Secondary | ICD-10-CM | POA: Diagnosis not present

## 2018-01-21 DIAGNOSIS — F419 Anxiety disorder, unspecified: Secondary | ICD-10-CM | POA: Diagnosis present

## 2018-01-21 DIAGNOSIS — M797 Fibromyalgia: Secondary | ICD-10-CM | POA: Diagnosis present

## 2018-01-21 DIAGNOSIS — D62 Acute posthemorrhagic anemia: Secondary | ICD-10-CM | POA: Diagnosis not present

## 2018-01-21 DIAGNOSIS — R402253 Coma scale, best verbal response, oriented, at hospital admission: Secondary | ICD-10-CM | POA: Diagnosis present

## 2018-01-21 DIAGNOSIS — W1789XA Other fall from one level to another, initial encounter: Secondary | ICD-10-CM | POA: Diagnosis present

## 2018-01-21 DIAGNOSIS — K5900 Constipation, unspecified: Secondary | ICD-10-CM | POA: Diagnosis not present

## 2018-01-21 DIAGNOSIS — Z981 Arthrodesis status: Secondary | ICD-10-CM

## 2018-01-21 DIAGNOSIS — M1990 Unspecified osteoarthritis, unspecified site: Secondary | ICD-10-CM | POA: Diagnosis present

## 2018-01-21 DIAGNOSIS — Z91018 Allergy to other foods: Secondary | ICD-10-CM

## 2018-01-21 DIAGNOSIS — E222 Syndrome of inappropriate secretion of antidiuretic hormone: Secondary | ICD-10-CM | POA: Diagnosis not present

## 2018-01-21 DIAGNOSIS — F329 Major depressive disorder, single episode, unspecified: Secondary | ICD-10-CM | POA: Diagnosis present

## 2018-01-21 DIAGNOSIS — S140XXA Concussion and edema of cervical spinal cord, initial encounter: Secondary | ICD-10-CM | POA: Diagnosis present

## 2018-01-21 DIAGNOSIS — Z96652 Presence of left artificial knee joint: Secondary | ICD-10-CM | POA: Diagnosis present

## 2018-01-21 DIAGNOSIS — R4181 Age-related cognitive decline: Secondary | ICD-10-CM | POA: Diagnosis not present

## 2018-01-21 DIAGNOSIS — S14156A Other incomplete lesion at C6 level of cervical spinal cord, initial encounter: Secondary | ICD-10-CM | POA: Diagnosis not present

## 2018-01-21 DIAGNOSIS — Z880 Allergy status to penicillin: Secondary | ICD-10-CM

## 2018-01-21 DIAGNOSIS — Z885 Allergy status to narcotic agent status: Secondary | ICD-10-CM

## 2018-01-21 DIAGNOSIS — I471 Supraventricular tachycardia: Secondary | ICD-10-CM | POA: Diagnosis present

## 2018-01-21 DIAGNOSIS — Z888 Allergy status to other drugs, medicaments and biological substances status: Secondary | ICD-10-CM

## 2018-01-21 DIAGNOSIS — Z9119 Patient's noncompliance with other medical treatment and regimen: Secondary | ICD-10-CM

## 2018-01-21 DIAGNOSIS — K219 Gastro-esophageal reflux disease without esophagitis: Secondary | ICD-10-CM | POA: Diagnosis present

## 2018-01-21 DIAGNOSIS — I10 Essential (primary) hypertension: Secondary | ICD-10-CM | POA: Diagnosis not present

## 2018-01-21 DIAGNOSIS — G8250 Quadriplegia, unspecified: Secondary | ICD-10-CM | POA: Diagnosis present

## 2018-01-21 DIAGNOSIS — S129XXA Fracture of neck, unspecified, initial encounter: Secondary | ICD-10-CM | POA: Diagnosis present

## 2018-01-21 DIAGNOSIS — R339 Retention of urine, unspecified: Secondary | ICD-10-CM | POA: Diagnosis not present

## 2018-01-21 DIAGNOSIS — S12601A Unspecified nondisplaced fracture of seventh cervical vertebra, initial encounter for closed fracture: Secondary | ICD-10-CM | POA: Diagnosis not present

## 2018-01-21 DIAGNOSIS — K592 Neurogenic bowel, not elsewhere classified: Secondary | ICD-10-CM | POA: Diagnosis not present

## 2018-01-21 DIAGNOSIS — M6281 Muscle weakness (generalized): Secondary | ICD-10-CM | POA: Diagnosis not present

## 2018-01-21 DIAGNOSIS — R7989 Other specified abnormal findings of blood chemistry: Secondary | ICD-10-CM | POA: Diagnosis present

## 2018-01-21 DIAGNOSIS — Z7982 Long term (current) use of aspirin: Secondary | ICD-10-CM

## 2018-01-21 DIAGNOSIS — Z419 Encounter for procedure for purposes other than remedying health state, unspecified: Secondary | ICD-10-CM

## 2018-01-21 DIAGNOSIS — Z79899 Other long term (current) drug therapy: Secondary | ICD-10-CM

## 2018-01-21 DIAGNOSIS — S0093XA Contusion of unspecified part of head, initial encounter: Secondary | ICD-10-CM | POA: Diagnosis present

## 2018-01-21 DIAGNOSIS — R402363 Coma scale, best motor response, obeys commands, at hospital admission: Secondary | ICD-10-CM | POA: Diagnosis present

## 2018-01-21 DIAGNOSIS — Z9181 History of falling: Secondary | ICD-10-CM | POA: Diagnosis not present

## 2018-01-21 DIAGNOSIS — S4991XA Unspecified injury of right shoulder and upper arm, initial encounter: Secondary | ICD-10-CM | POA: Diagnosis not present

## 2018-01-21 DIAGNOSIS — R202 Paresthesia of skin: Secondary | ICD-10-CM | POA: Diagnosis not present

## 2018-01-21 DIAGNOSIS — S14109A Unspecified injury at unspecified level of cervical spinal cord, initial encounter: Secondary | ICD-10-CM | POA: Diagnosis present

## 2018-01-21 DIAGNOSIS — N319 Neuromuscular dysfunction of bladder, unspecified: Secondary | ICD-10-CM | POA: Diagnosis not present

## 2018-01-21 DIAGNOSIS — S12590A Other displaced fracture of sixth cervical vertebra, initial encounter for closed fracture: Secondary | ICD-10-CM | POA: Diagnosis not present

## 2018-01-21 DIAGNOSIS — Z9189 Other specified personal risk factors, not elsewhere classified: Secondary | ICD-10-CM

## 2018-01-21 DIAGNOSIS — E669 Obesity, unspecified: Secondary | ICD-10-CM | POA: Diagnosis present

## 2018-01-21 DIAGNOSIS — Z891 Acquired absence of hand and wrist: Secondary | ICD-10-CM | POA: Diagnosis not present

## 2018-01-21 DIAGNOSIS — I5032 Chronic diastolic (congestive) heart failure: Secondary | ICD-10-CM | POA: Diagnosis present

## 2018-01-21 DIAGNOSIS — R278 Other lack of coordination: Secondary | ICD-10-CM | POA: Diagnosis not present

## 2018-01-21 DIAGNOSIS — Z6836 Body mass index (BMI) 36.0-36.9, adult: Secondary | ICD-10-CM

## 2018-01-21 DIAGNOSIS — R0902 Hypoxemia: Secondary | ICD-10-CM | POA: Diagnosis not present

## 2018-01-21 DIAGNOSIS — R296 Repeated falls: Secondary | ICD-10-CM | POA: Diagnosis present

## 2018-01-21 LAB — CBC WITH DIFFERENTIAL/PLATELET
Abs Immature Granulocytes: 0.12 10*3/uL — ABNORMAL HIGH (ref 0.00–0.07)
Basophils Absolute: 0.1 10*3/uL (ref 0.0–0.1)
Basophils Relative: 0 %
Eosinophils Absolute: 0 10*3/uL (ref 0.0–0.5)
Eosinophils Relative: 0 %
HCT: 42.6 % (ref 36.0–46.0)
Hemoglobin: 13.8 g/dL (ref 12.0–15.0)
Immature Granulocytes: 1 %
Lymphocytes Relative: 6 %
Lymphs Abs: 1.1 10*3/uL (ref 0.7–4.0)
MCH: 28.3 pg (ref 26.0–34.0)
MCHC: 32.4 g/dL (ref 30.0–36.0)
MCV: 87.3 fL (ref 80.0–100.0)
Monocytes Absolute: 0.9 10*3/uL (ref 0.1–1.0)
Monocytes Relative: 5 %
Neutro Abs: 16.8 10*3/uL — ABNORMAL HIGH (ref 1.7–7.7)
Neutrophils Relative %: 88 %
Platelets: 175 10*3/uL (ref 150–400)
RBC: 4.88 MIL/uL (ref 3.87–5.11)
RDW: 13.1 % (ref 11.5–15.5)
WBC: 19 10*3/uL — ABNORMAL HIGH (ref 4.0–10.5)
nRBC: 0 % (ref 0.0–0.2)

## 2018-01-21 LAB — URINALYSIS, ROUTINE W REFLEX MICROSCOPIC
Bilirubin Urine: NEGATIVE
Glucose, UA: 50 mg/dL — AB
Ketones, ur: NEGATIVE mg/dL
Leukocytes, UA: NEGATIVE
Nitrite: POSITIVE — AB
Protein, ur: 100 mg/dL — AB
Specific Gravity, Urine: 1.015 (ref 1.005–1.030)
pH: 5 (ref 5.0–8.0)

## 2018-01-21 LAB — BASIC METABOLIC PANEL
Anion gap: 13 (ref 5–15)
BUN: 22 mg/dL (ref 8–23)
CO2: 21 mmol/L — ABNORMAL LOW (ref 22–32)
Calcium: 9.6 mg/dL (ref 8.9–10.3)
Chloride: 102 mmol/L (ref 98–111)
Creatinine, Ser: 0.84 mg/dL (ref 0.44–1.00)
GFR calc Af Amer: >60 mL/min (ref >60)
GFR calc non Af Amer: >60 mL/min (ref >60)
Glucose, Bld: 204 mg/dL — ABNORMAL HIGH (ref 70–99)
Potassium: 4.1 mmol/L (ref 3.5–5.1)
Sodium: 136 mmol/L (ref 135–145)

## 2018-01-21 LAB — PROTIME-INR
INR: 0.94
Prothrombin Time: 12.5 seconds (ref 11.4–15.2)

## 2018-01-21 MED ORDER — METOPROLOL SUCCINATE ER 25 MG PO TB24
25.0000 mg | ORAL_TABLET | Freq: Every day | ORAL | Status: DC
  Administered 2018-01-23 – 2018-01-29 (×7): 25 mg via ORAL
  Filled 2018-01-21 (×8): qty 1

## 2018-01-21 MED ORDER — CIPROFLOXACIN IN D5W 400 MG/200ML IV SOLN
400.0000 mg | Freq: Two times a day (BID) | INTRAVENOUS | Status: DC
  Administered 2018-01-22: 400 mg via INTRAVENOUS
  Filled 2018-01-21: qty 200

## 2018-01-21 MED ORDER — ASPIRIN EC 81 MG PO TBEC: 81.0000 mg | DELAYED_RELEASE_TABLET | Freq: Every evening | ORAL | Status: DC

## 2018-01-21 MED ORDER — VERAPAMIL HCL ER 240 MG PO TBCR
240.0000 mg | EXTENDED_RELEASE_TABLET | Freq: Every day | ORAL | Status: DC
  Administered 2018-01-23 – 2018-01-29 (×7): 240 mg via ORAL
  Filled 2018-01-21 (×8): qty 1

## 2018-01-21 MED ORDER — PANTOPRAZOLE SODIUM 40 MG PO TBEC
40.0000 mg | DELAYED_RELEASE_TABLET | Freq: Every day | ORAL | Status: DC
  Administered 2018-01-23 – 2018-01-29 (×7): 40 mg via ORAL
  Filled 2018-01-21 (×8): qty 1

## 2018-01-21 MED ORDER — DIGOXIN 125 MCG PO TABS
0.1250 mg | ORAL_TABLET | Freq: Every day | ORAL | Status: DC
  Administered 2018-01-23 – 2018-01-29 (×7): 0.125 mg via ORAL
  Filled 2018-01-21 (×8): qty 1

## 2018-01-21 MED ORDER — ONDANSETRON HCL 4 MG/2ML IJ SOLN: 4.0000 mg | Freq: Four times a day (QID) | INTRAMUSCULAR | Status: DC | PRN

## 2018-01-21 MED ORDER — INSULIN GLARGINE 100 UNIT/ML ~~LOC~~ SOLN
65.0000 [IU] | Freq: Every day | SUBCUTANEOUS | Status: DC
  Administered 2018-01-22: 65 [IU] via SUBCUTANEOUS
  Filled 2018-01-21: qty 0.65

## 2018-01-21 MED ORDER — ROPINIROLE HCL 1 MG PO TABS
1.0000 mg | ORAL_TABLET | Freq: Every day | ORAL | Status: DC
  Administered 2018-01-22 – 2018-01-28 (×7): 1 mg via ORAL
  Filled 2018-01-21 (×7): qty 1

## 2018-01-21 MED ORDER — SODIUM CHLORIDE 0.9 % IV SOLN
INTRAVENOUS | Status: AC
  Administered 2018-01-21: 21:00:00 via INTRAVENOUS

## 2018-01-21 MED ORDER — ONDANSETRON HCL 4 MG PO TABS: 4.0000 mg | ORAL_TABLET | Freq: Four times a day (QID) | ORAL | Status: DC | PRN

## 2018-01-21 MED ORDER — HYDROMORPHONE HCL 1 MG/ML IJ SOLN
1.0000 mg | Freq: Once | INTRAMUSCULAR | Status: AC
  Administered 2018-01-21: 1 mg via INTRAVENOUS
  Filled 2018-01-21: qty 1

## 2018-01-21 MED ORDER — ACETAMINOPHEN 325 MG PO TABS
650.0000 mg | ORAL_TABLET | Freq: Four times a day (QID) | ORAL | Status: DC | PRN
  Filled 2018-01-21: qty 2

## 2018-01-21 MED ORDER — ACETAMINOPHEN 650 MG RE SUPP: 650.0000 mg | Freq: Four times a day (QID) | RECTAL | Status: DC | PRN

## 2018-01-21 MED ORDER — SERTRALINE HCL 50 MG PO TABS: 50.0000 mg | ORAL_TABLET | Freq: Every evening | ORAL | Status: DC

## 2018-01-21 MED ORDER — FENTANYL CITRATE (PF) 100 MCG/2ML IJ SOLN
25.0000 ug | INTRAMUSCULAR | Status: DC | PRN
  Administered 2018-01-21 – 2018-01-22 (×2): 25 ug via INTRAVENOUS
  Filled 2018-01-21 (×2): qty 2

## 2018-01-21 MED ORDER — INSULIN ASPART 100 UNIT/ML ~~LOC~~ SOLN
0.0000 [IU] | Freq: Three times a day (TID) | SUBCUTANEOUS | Status: DC
  Administered 2018-01-22 (×2): 2 [IU] via SUBCUTANEOUS
  Administered 2018-01-22 (×2): 3 [IU] via SUBCUTANEOUS
  Administered 2018-01-23: 1 [IU] via SUBCUTANEOUS
  Administered 2018-01-23 (×2): 2 [IU] via SUBCUTANEOUS
  Administered 2018-01-24 – 2018-01-26 (×5): 1 [IU] via SUBCUTANEOUS
  Administered 2018-01-26: 2 [IU] via SUBCUTANEOUS
  Administered 2018-01-27 (×2): 1 [IU] via SUBCUTANEOUS
  Administered 2018-01-28: 2 [IU] via SUBCUTANEOUS
  Administered 2018-01-28: 1 [IU] via SUBCUTANEOUS

## 2018-01-21 MED ORDER — NITROGLYCERIN 0.4 MG SL SUBL: 0.4000 mg | SUBLINGUAL_TABLET | SUBLINGUAL | Status: DC | PRN

## 2018-01-21 MED ORDER — CIPROFLOXACIN IN D5W 400 MG/200ML IV SOLN
400.0000 mg | Freq: Once | INTRAVENOUS | Status: AC
  Administered 2018-01-21: 400 mg via INTRAVENOUS
  Filled 2018-01-21: qty 200

## 2018-01-21 MED ORDER — RAMIPRIL 10 MG PO CAPS
10.0000 mg | ORAL_CAPSULE | Freq: Every evening | ORAL | Status: DC
  Administered 2018-01-22: 10 mg via ORAL
  Filled 2018-01-21 (×2): qty 1
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
## 2018-01-21 NOTE — ED Notes (Signed)
Patient transported to X-ray 

## 2018-01-21 NOTE — ED Notes (Signed)
Materials management notified of need for aspen collar

## 2018-01-21 NOTE — ED Notes (Signed)
Pharmacy messaged about unverified meds 

## 2018-01-21 NOTE — ED Notes (Signed)
Help get patient undress on the monitor patient is resting with family at bedside and call bell in reach

## 2018-01-21 NOTE — ED Notes (Signed)
ED Provider at bedside. 

## 2018-01-21 NOTE — H&P (Signed)
History and Physical    [name] FYB:[id number] DOB: 03-15-1940 DOA: 01/21/2018  PCP: [name], [name], MD  Patient coming from: Home.  Chief Complaint: Fall.  HPI: [name] is a 77 y.o. female with history of SVT, hypertension, fibromyalgia, diabetes mellitus had a fall at home while she was trying to open her door.  She struck her head when she fell from a porch.  Denies losing consciousness.  Denies any chest pain or palpitations.  ED Course: In the ER CT head and C-spine was done which shows acute fracture of the C7 spine.  On-call neurosurgeon Dr. Trenton Gammon was consulted by the ER physician who advised cervical collar and follow-up.  Patient did have a recent C-spine surgery by Dr. [name].  And has follow-up as outpatient next week.  But since patient has significant pain and weakness patient admitted for further management and observation.  UA shows possibility of UTI and was placed on Cipro.  Patient also has leukocytosis but has no fever.  Review of Systems: As per HPI, rest all negative.   Past Medical History:  Diagnosis Date  . Anemia    during 1st pregnancy  . Anxiety   . Arthritis   . Complication of anesthesia   . Depression   . Diarrhea, functional   . Dysrhythmia    Paroysmal Atrial Tachycardia  . Essential hypertension   . Fibromyalgia   . GERD (gastroesophageal reflux disease)   . H/O acute pancreatitis   . Headache   . History of hiatal hernia   . Hyperlipemia   . Osteoporosis   . PAT (paroxysmal atrial tachycardia) ([place])   . Pneumonia   . PONV (postoperative nausea and vomiting)   . PSVT (paroxysmal supraventricular tachycardia) ([place])   . Reflux   . Restless leg syndrome   . Type 2 diabetes mellitus (HCC)    Type II  . Vertigo     Past Surgical History:  Procedure Laterality Date  . ABDOMINAL HYSTERECTOMY    . ANKLE SURGERY    . ANTERIOR CERVICAL DECOMP/DISCECTOMY FUSION N/A 11/12/2017   Procedure: Cervical seven to Thoracic one   Anterior cervical decompression/discectomy/fusion with removal of old plate;  Surgeon: [name], MD;  Location: [place];  Service: Neurosurgery;  Laterality: N/A;  . BACK SURGERY     T12-L1 fusion, Anterior Cervical fusion  . CARDIAC CATHETERIZATION N/A 02/16/2015   Procedure: Left Heart Cath and Coronary Angiography;  Surgeon: Sherren Mocha, MD;  Location: [facility];  Service: Cardiovascular;  Laterality: N/A;  . CHOLECYSTECTOMY    . COLONOSCOPY    . ESOPHAGOGASTRODUODENOSCOPY  12/21/2010   Procedure: ESOPHAGOGASTRODUODENOSCOPY (EGD);  Surgeon: Landry Dyke, MD;  Location: [facility];  Service: Endoscopy;  Laterality: N/A;  . EXCISION MORTON'S NEUROMA    . EYE SURGERY Bilateral    Cataract  . FRACTURE SURGERY     ankle x 2   . KNEE SURGERY     x 3  . NECK SURGERY    . ROTATOR CUFF REPAIR    . TONSILLECTOMY    . TOTAL KNEE ARTHROPLASTY Left 04/10/2017   Procedure: LEFT TOTAL KNEE ARTHROPLASTY;  Surgeon: Paralee Cancel, MD;  Location: [facility];  Service: Orthopedics;  Laterality: Left;  70 mins  . UPPER GASTROINTESTINAL ENDOSCOPY    . WRIST SURGERY       reports that she has quit smoking. She has never used smokeless tobacco. She reports that she does not drink alcohol or use  drugs.  Allergies  Allergen Reactions  . Amoxil [Amoxicillin] Swelling and Other (See Comments)    Lips Has patient had a PCN reaction causing immediate rash, facial/tongue/throat swelling, SOB or lightheadedness with hypotension:YES Has patient had a PCN reaction causing severe rash involving mucus membranes or skin necrosis:No Has patient had a PCN reaction that required hospitalization:No Has patient had a PCN reaction occurring within the last 10 years:Yes. If all of the above answers are "NO", then may proceed with Cephalosporin use.   . Chocolate Hives  . Demerol Nausea And Vomiting  . Lisinopril Itching and Swelling  . Meperidine     Other reaction(s): Vomiting (intolerance)  .  Meperidine Hcl     Other reaction(s): Vomiting (intolerance)  . Other     Raw food or nuts - GI pain    Family History  Problem Relation Age of Onset  . Heart failure Mother   . Hyperlipidemia Mother   . Lung cancer Father        Lymphoma  . Hyperlipidemia Maternal Grandmother   . Peripheral vascular disease Maternal Grandmother     Prior to Admission medications   Medication Sig Start Date End Date Taking? Authorizing Provider  acetaminophen (TYLENOL) 325 MG tablet Take 650 mg by mouth 2 (two) times daily as needed for moderate pain.    Yes [provider]  aspirin EC 81 MG tablet Take 81 mg by mouth every evening.   Yes [provider]  Calcium Carb-Cholecalciferol (CALCIUM 600+D3 PO) Take 1 tablet by mouth every evening.   Yes [provider]  cholecalciferol (VITAMIN D) 1000 units tablet Take 1,000 Units by mouth daily.   Yes [provider]  Cranberry 1000 MG CAPS Take 1,000 mg by mouth 2 (two) times daily.    Yes [provider]  digoxin (LANOXIN) 0.125 MG tablet Take 0.125 mg by mouth daily.   Yes [provider]  Insulin Glargine (BASAGLAR KWIKPEN) 100 UNIT/ML SOPN Inject 0.6 mLs (60 Units total) into the skin daily. Patient taking differently: Inject 65 Units into the skin at bedtime.  09/18/17  Yes [name], [name], MD  liraglutide (VICTOZA) 18 MG/3ML SOPN Inject 0.1 mLs (0.6 mg total) into the skin daily. Patient taking differently: Inject 0.6 mg into the skin at bedtime.  12/28/17  Yes Shamleffer, Melanie Crazier, MD  metoprolol succinate (TOPROL-XL) 25 MG 24 hr tablet Take 1 tablet (25 mg total) by mouth daily. 12/18/17  Yes Park Liter, MD  Multiple Vitamins-Minerals (PRESERVISION AREDS 2 PO) Take 1 capsule by mouth 2 (two) times daily.   Yes [provider]  nitroGLYCERIN (NITROSTAT) 0.4 MG SL tablet Place 0.4 mg under the tongue every 5 (five) minutes as needed for chest pain.    Yes [provider]  omeprazole (PRILOSEC) 20 MG capsule Take 1 capsule (20 mg total) by mouth daily. Patient taking differently: Take 20 mg by mouth at bedtime.  05/01/17  Yes [name], [name], MD  ramipril (ALTACE) 10 MG capsule Take 1 capsule (10 mg total) by mouth every evening. 11/12/17  Yes [name], [name], MD  rOPINIRole (REQUIP) 1 MG tablet TAKE ONE TABLET BY MOUTH AT BEDTIME. Patient taking differently: Take 1 mg by mouth at bedtime.  12/25/17  Yes [name], [name], MD  sertraline (ZOLOFT) 100 MG tablet Take 1 tablet (100 mg total) by mouth every evening. Patient taking differently: Take 50 mg by mouth every evening.  11/28/17  Yes [name], [name], MD  verapamil (CALAN) 120 MG tablet Take 120 mg by mouth daily as needed (afib).    Yes [provider]  verapamil (VERELAN PM) 240 MG 24 hr capsule Take 1 capsule (240 mg total) by mouth daily. 05/17/16  Yes Rivet, Sindy Guadeloupe, MD  Insulin Pen Needle (BD PEN NEEDLE MICRO U/F) 32G X 6 MM MISC Twice a day (basaglar and victoza) 12/28/17   Shamleffer, Melanie Crazier, MD  methocarbamol (ROBAXIN) 500 MG tablet Take 1 tablet (500 mg total) by mouth every 6 (six) hours as needed for muscle spasms. Patient not taking: Reported on 01/21/2018 11/13/17   [name], MD  [place] 1ML/31G 31G X 5/16" 1 ML MISC Use as directed 01/30/17   [name], [name], MD    Physical Exam: Vitals:   01/21/18 1432 01/21/18 1710 01/21/18 1742 01/21/18 1843  BP: (!) 160/83 (!) 163/73 (!) 168/75 (!) 168/80  Pulse: 71 81 74 77  Resp: 18 18 16 16   Temp:      TempSrc:      SpO2: 99% 100% 96% 96%      Constitutional: Moderately built and nourished. Vitals:   01/21/18 1432 01/21/18 1710 01/21/18 1742 01/21/18 1843  BP: (!) 160/83 (!) 163/73 (!) 168/75 (!) 168/80  Pulse: 71 81 74 77  Resp: 18 18 16 16   Temp:      TempSrc:      SpO2: 99% 100% 96% 96%   Eyes: Anicteric no pallor. ENMT: No discharge from the ears eyes nose or mouth. Neck: Neck collar in  place. Respiratory: No rhonchi or crepitations. Cardiovascular: S1-S2 heard. Abdomen: Soft nontender bowel sounds present. Musculoskeletal: No edema.  No joint effusion. Skin: No rash. Neurologic: Alert awake oriented to time place and person.  Moves all extremities. Psychiatric: Appears normal.   Labs on Admission: I have personally reviewed following labs and imaging studies  CBC: Recent Labs  Lab 01/21/18 1612  WBC 19.0*  NEUTROABS 16.8*  HGB 13.8  HCT 42.6  MCV 87.3  PLT 681   Basic Metabolic Panel: Recent Labs  Lab 01/21/18 1612  NA 136  K 4.1  CL 102  CO2 21*  GLUCOSE 204*  BUN 22  CREATININE 0.84  CALCIUM 9.6   GFR: CrCl cannot be calculated (Unknown ideal weight.). Liver Function Tests: No results for input(s): AST, ALT, ALKPHOS, BILITOT, PROT, ALBUMIN in the last 168 hours. No results for input(s): LIPASE, AMYLASE in the last 168 hours. No results for input(s): AMMONIA in the last 168 hours. Coagulation Profile: Recent Labs  Lab 01/21/18 1612  INR 0.94   Cardiac Enzymes: No results for input(s): CKTOTAL, CKMB, CKMBINDEX, TROPONINI in the last 168 hours. BNP (last 3 results) No results for input(s): PROBNP in the last 8760 hours. HbA1C: No results for input(s): HGBA1C in the last 72 hours. CBG: No results for input(s): GLUCAP in the last 168 hours. Lipid Profile: No results for input(s): CHOL, HDL, LDLCALC, TRIG, CHOLHDL, LDLDIRECT in the last 72 hours. Thyroid Function Tests: No results for input(s): TSH, T4TOTAL, FREET4, T3FREE, THYROIDAB in the last 72 hours. Anemia Panel: No results for input(s): VITAMINB12, FOLATE, FERRITIN, TIBC, IRON, RETICCTPCT in the last 72 hours. Urine analysis:    Component Value Date/Time   COLORURINE YELLOW 01/21/2018 1742   APPEARANCEUR CLEAR 01/21/2018 1742   LABSPEC 1.015 01/21/2018 1742   PHURINE 5.0 01/21/2018 1742   GLUCOSEU 50 (A) 01/21/2018 1742   HGBUR MODERATE (A) 01/21/2018 1742   BILIRUBINUR  NEGATIVE 01/21/2018 1742  BILIRUBINUR 1+ 05/04/2017 1511   KETONESUR NEGATIVE 01/21/2018 1742   PROTEINUR 100 (A) 01/21/2018 1742   UROBILINOGEN 1.0 05/04/2017 1511   UROBILINOGEN 0.2 05/07/2012 1632   NITRITE POSITIVE (A) 01/21/2018 1742   LEUKOCYTESUR NEGATIVE 01/21/2018 1742   Sepsis Labs: @LABRCNTIP (procalcitonin:4,lacticidven:4) )No results found for this or any previous visit (from the past 240 hour(s)).   Radiological Exams on Admission: Dg Shoulder Right  Result Date: 01/21/2018 CLINICAL DATA:  Fall EXAM: RIGHT SHOULDER - 2+ VIEW COMPARISON:  07/19/2011 FINDINGS: Negative for fracture or dislocation. Mild degenerative change in the [facility] joint and shoulder joint. Mild associated spurring. IMPRESSION: Negative for fracture or dislocation. Electronically Signed   By: Franchot Gallo M.D.   On: 01/21/2018 14:59   Ct Head Wo Contrast  Result Date: 01/21/2018 CLINICAL DATA:  Patient status post C7-T1 fusion 10/24/2017. History of prior C4-6 fusion. Status post fall off a porch today. Limited range of motion of the head and new tingling in the left fingers since the fall. Initial encounter. EXAM: CT HEAD WITHOUT CONTRAST CT CERVICAL SPINE WITHOUT CONTRAST TECHNIQUE: Multidetector CT imaging of the head and cervical spine was performed following the standard protocol without intravenous contrast. Multiplanar CT image reconstructions of the cervical spine were also generated. COMPARISON:  Brain MRI 01/12/2018. Cervical spine CT scan 12/03/2017. Head CT 09/21/2017. FINDINGS: CT HEAD FINDINGS Brain: No evidence of acute infarction, hemorrhage, hydrocephalus, extra-axial collection or mass lesion/mass effect. Vascular: No hyperdense vessel or unexpected calcification. Skull: Intact.  No focal lesion. Sinuses/Orbits: Small mucous retention cyst or polyp right maxillary sinus is unchanged. The patient is status post cataract surgery bilaterally. Other: None. CT CERVICAL SPINE FINDINGS Alignment:  Approximately 0.4 cm anterolisthesis C7 on T1 is unchanged since the prior CT. Alignment is otherwise maintained. Skull base and vertebrae: The patient has an acute fracture of C7. The fracture extends from the left side of the vertebral body into the left C7 facet and base of the left transverse process. The fracture is minimally displaced at 1-2 mm. No other fracture is identified. The interbody spacer and C7 appears to be incorporating into the endplates. Soft tissues and spinal canal: No epidural hematoma is identified. Disc levels: Loss of disc space height and endplate spurring at D3-2 appear unchanged. Upper chest: Lung apices are clear. Other: None. IMPRESSION: Acute fracture of the left side of the C7 vertebral body extends into the left facet and base of the left transverse process. No other acute abnormality is identified. 0.4 cm anterolisthesis C7 on T1 is unchanged. Status post C4-6 and C7-T1 fusion. No acute intracranial normality. Electronically Signed   By: Inge Rise M.D.   On: 01/21/2018 15:32   Ct Cervical Spine Wo Contrast  Result Date: 01/21/2018 CLINICAL DATA:  Patient status post C7-T1 fusion 10/24/2017. History of prior C4-6 fusion. Status post fall off a porch today. Limited range of motion of the head and new tingling in the left fingers since the fall. Initial encounter. EXAM: CT HEAD WITHOUT CONTRAST CT CERVICAL SPINE WITHOUT CONTRAST TECHNIQUE: Multidetector CT imaging of the head and cervical spine was performed following the standard protocol without intravenous contrast. Multiplanar CT image reconstructions of the cervical spine were also generated. COMPARISON:  Brain MRI 01/12/2018. Cervical spine CT scan 12/03/2017. Head CT 09/21/2017. FINDINGS: CT HEAD FINDINGS Brain: No evidence of acute infarction, hemorrhage, hydrocephalus, extra-axial collection or mass lesion/mass effect. Vascular: No hyperdense vessel or unexpected calcification. Skull: Intact.  No focal lesion.  Sinuses/Orbits: Small mucous retention cyst  or polyp right maxillary sinus is unchanged. The patient is status post cataract surgery bilaterally. Other: None. CT CERVICAL SPINE FINDINGS Alignment: Approximately 0.4 cm anterolisthesis C7 on T1 is unchanged since the prior CT. Alignment is otherwise maintained. Skull base and vertebrae: The patient has an acute fracture of C7. The fracture extends from the left side of the vertebral body into the left C7 facet and base of the left transverse process. The fracture is minimally displaced at 1-2 mm. No other fracture is identified. The interbody spacer and C7 appears to be incorporating into the endplates. Soft tissues and spinal canal: No epidural hematoma is identified. Disc levels: Loss of disc space height and endplate spurring at W1-0 appear unchanged. Upper chest: Lung apices are clear. Other: None. IMPRESSION: Acute fracture of the left side of the C7 vertebral body extends into the left facet and base of the left transverse process. No other acute abnormality is identified. 0.4 cm anterolisthesis C7 on T1 is unchanged. Status post C4-6 and C7-T1 fusion. No acute intracranial normality. Electronically Signed   By: Inge Rise M.D.   On: 01/21/2018 15:32   Dg Shoulder Left  Result Date: 01/21/2018 CLINICAL DATA:  [place] down stairs EXAM: LEFT SHOULDER - 2+ VIEW COMPARISON:  None. FINDINGS: There is no evidence of fracture or dislocation. There is no evidence of arthropathy or other focal bone abnormality. Soft tissues are unremarkable. IMPRESSION: Negative. Electronically Signed   By: Franchot Gallo M.D.   On: 01/21/2018 15:00    EKG: Independently reviewed.  Normal sinus rhythm.  Assessment/Plan Active Problems:   DM (diabetes mellitus) type II controlled, neurological manifestation (HCC)   Paroxysmal atrial tachycardia (HCC)   Hyperlipidemia   RLS (restless legs syndrome)   Weakness   Closed C7 fracture ([place])    1. Acute C7 fracture on  cervical collar to follow-up as outpatient with Dr. Consuello Masse. Trenton Gammon neurosurgeon.  On pain relief medications physical therapy consult. 2. Possible UTI on Cipro.  Follow urine cultures. 3. History of SVT on metoprolol digoxin and verapamil. 4. Hypertension on ramipril metoprolol and verapamil. 5. Restless leg syndrome on ropinirole. 6. Diabetes mellitus type 2 we will keep patient on long-acting insulin.  Follow CBGs.  Sliding scale coverage.  CK and troponins are pending.   DVT prophylaxis: SCDs. Code Status: Full code. Family Communication: Patient's daughter. Disposition Plan: To be determined. Consults called: Physical therapy. Admission status: Observation.   [name] MD [facility] Pager [phone].  If 7PM-7AM, please contact night-coverage [email or web address] Password [email or web address]  01/21/2018, 8:12 PM

## 2018-01-21 NOTE — ED Provider Notes (Signed)
[facility] EMERGENCY DEPARTMENT Provider Note   CSN: [id number] Arrival date & time: 01/21/18  1341     History   Chief Complaint Chief Complaint  Patient presents with  . Fall    HPI [name] is a 77 y.o. female.  HPI  Patient presents with her daughter who assists with the HPI. Patient is here after a fall. Patient has multiple medical issues, most notably prior cervical spine fusion. Today she had a mechanical fall off of a porch, approximately 2 feet in the air. It is unclear what type of service that she fell upon, but since that time she has had pain throughout her head, neck, both shoulders. No clear loss of consciousness, no persistent confusion, disorientation. Patient is equivocal about new weakness, has, seemingly, baseline weakness in both upper extremities, worse on the left. Describes pain is severe, sore, diffuse. No relief with fentanyl, 1 5 0 mg provided by EMS providers in route.   Past Medical History:  Diagnosis Date  . Anemia    during 1st pregnancy  . Anxiety   . Arthritis   . Complication of anesthesia   . Depression   . Diarrhea, functional   . Dysrhythmia    Paroysmal Atrial Tachycardia  . Essential hypertension   . Fibromyalgia   . GERD (gastroesophageal reflux disease)   . H/O acute pancreatitis   . Headache   . History of hiatal hernia   . Hyperlipemia   . Osteoporosis   . PAT (paroxysmal atrial tachycardia) ([place])   . Pneumonia   . PONV (postoperative nausea and vomiting)   . PSVT (paroxysmal supraventricular tachycardia) ([place])   . Reflux   . Restless leg syndrome   . Type 2 diabetes mellitus (HCC)    Type II  . Vertigo     Patient Active Problem List   Diagnosis Date Noted  . Cervical spondylosis with myelopathy 11/12/2017  . Tinnitus of both ears 10/19/2017  . Rhinorrhea 09/26/2017  . Chronic diarrhea 08/08/2017  . S/P left TKA 04/10/2017  . Unstable gait 01/12/2017  . Generalized  osteoarthritis of hand 01/12/2017  . RLS (restless legs syndrome) 11/09/2016  . GERD (gastroesophageal reflux disease) 07/13/2016  . Class 2 severe obesity due to excess calories with serious comorbidity and body mass index (BMI) of 36.0 to 36.9 in adult ([place]) 06/01/2016  . Vitamin D deficiency 06/01/2016  . Vitamin B12 deficiency 03/27/2016  . Chronic cough 03/16/2016  . Left foot pain 03/16/2016  . Demand ischemia ([place]) 02/14/2015  . Degenerative drusen 07/13/2013  . Type 2 diabetes mellitus without ophthalmic manifestations ([place]) 07/13/2013  . DM (diabetes mellitus) type II controlled, neurological manifestation ([place]) 05/07/2012  . Paroxysmal atrial tachycardia ([place]) 05/07/2012  . Essential hypertension, benign 05/07/2012  . Depression 05/07/2012  . Hyperlipidemia 05/07/2012  . Fibromyalgia 05/07/2012    Past Surgical History:  Procedure Laterality Date  . ABDOMINAL HYSTERECTOMY    . ANKLE SURGERY    . ANTERIOR CERVICAL DECOMP/DISCECTOMY FUSION N/A 11/12/2017   Procedure: Cervical seven to Thoracic one  Anterior cervical decompression/discectomy/fusion with removal of old plate;  Surgeon: [name], MD;  Location: [facility];  Service: Neurosurgery;  Laterality: N/A;  . BACK SURGERY     T12-L1 fusion, Anterior Cervical fusion  . CARDIAC CATHETERIZATION N/A 02/16/2015   Procedure: Left Heart Cath and Coronary Angiography;  Surgeon: Sherren Mocha, MD;  Location: [facility];  Service: Cardiovascular;  Laterality: N/A;  . CHOLECYSTECTOMY    .  COLONOSCOPY    . ESOPHAGOGASTRODUODENOSCOPY  12/21/2010   Procedure: ESOPHAGOGASTRODUODENOSCOPY (EGD);  Surgeon: Landry Dyke, MD;  Location: [facility];  Service: Endoscopy;  Laterality: N/A;  . EXCISION MORTON'S NEUROMA    . EYE SURGERY Bilateral    Cataract  . FRACTURE SURGERY     ankle x 2   . KNEE SURGERY     x 3  . NECK SURGERY    . ROTATOR CUFF REPAIR    . TONSILLECTOMY    . TOTAL KNEE ARTHROPLASTY Left 04/10/2017    Procedure: LEFT TOTAL KNEE ARTHROPLASTY;  Surgeon: Paralee Cancel, MD;  Location: [facility];  Service: Orthopedics;  Laterality: Left;  70 mins  . UPPER GASTROINTESTINAL ENDOSCOPY    . WRIST SURGERY       OB History   None      Home Medications    Prior to Admission medications   Medication Sig Start Date End Date Taking? Authorizing Provider  acetaminophen (TYLENOL) 325 MG tablet Take 650 mg by mouth 2 (two) times daily as needed for moderate pain.    Yes [provider]  aspirin EC 81 MG tablet Take 81 mg by mouth every evening.   Yes [provider]  Calcium Carb-Cholecalciferol (CALCIUM 600+D3 PO) Take 1 tablet by mouth every evening.   Yes [provider]  cholecalciferol (VITAMIN D) 1000 units tablet Take 1,000 Units by mouth daily.   Yes [provider]  Cranberry 1000 MG CAPS Take 1,000 mg by mouth 2 (two) times daily.    Yes [provider]  digoxin (LANOXIN) 0.125 MG tablet Take 0.125 mg by mouth daily.   Yes [provider]  Insulin Glargine (BASAGLAR KWIKPEN) 100 UNIT/ML SOPN Inject 0.6 mLs (60 Units total) into the skin daily. Patient taking differently: Inject 65 Units into the skin at bedtime.  09/18/17  Yes [name], [name], MD  liraglutide (VICTOZA) 18 MG/3ML SOPN Inject 0.1 mLs (0.6 mg total) into the skin daily. Patient taking differently: Inject 0.6 mg into the skin at bedtime.  12/28/17  Yes Shamleffer, Melanie Crazier, MD  metoprolol succinate (TOPROL-XL) 25 MG 24 hr tablet Take 1 tablet (25 mg total) by mouth daily. 12/18/17  Yes Park Liter, MD  Multiple Vitamins-Minerals (PRESERVISION AREDS 2 PO) Take 1 capsule by mouth 2 (two) times daily.   Yes [provider]  nitroGLYCERIN (NITROSTAT) 0.4 MG SL tablet Place 0.4 mg under the tongue every 5 (five) minutes as needed for chest pain.    Yes [provider]  omeprazole (PRILOSEC) 20 MG capsule Take 1 capsule (20 mg total) by mouth  daily. Patient taking differently: Take 20 mg by mouth at bedtime.  05/01/17  Yes [name], [name], MD  ramipril (ALTACE) 10 MG capsule Take 1 capsule (10 mg total) by mouth every evening. 11/12/17  Yes [name], [name], MD  rOPINIRole (REQUIP) 1 MG tablet TAKE ONE TABLET BY MOUTH AT BEDTIME. Patient taking differently: Take 1 mg by mouth at bedtime.  12/25/17  Yes [name], [name], MD  sertraline (ZOLOFT) 100 MG tablet Take 1 tablet (100 mg total) by mouth every evening. Patient taking differently: Take 50 mg by mouth every evening.  11/28/17  Yes [name], [name], MD  verapamil (CALAN) 120 MG tablet Take 120 mg by mouth daily as needed (afib).    Yes [provider]  verapamil (VERELAN PM) 240 MG 24 hr capsule Take 1 capsule (240 mg total) by mouth daily. 05/17/16  Yes Rivet, Carly J, MD  Insulin Pen Needle (BD PEN NEEDLE MICRO U/F) 32G X 6 MM MISC Twice a day (basaglar and victoza) 12/28/17   Shamleffer, Melanie Crazier, MD  methocarbamol (ROBAXIN) 500 MG tablet Take 1 tablet (500 mg total) by mouth every 6 (six) hours as needed for muscle spasms. Patient not taking: Reported on 01/21/2018 11/13/17   [name], MD  [place] 1ML/31G 31G X 5/16" 1 ML MISC Use as directed 01/30/17   [name], [name], MD    Family History Family History  Problem Relation Age of Onset  . Heart failure Mother   . Hyperlipidemia Mother   . Lung cancer Father        Lymphoma  . Hyperlipidemia Maternal Grandmother   . Peripheral vascular disease Maternal Grandmother     Social History Social History   Tobacco Use  . Smoking status: Former Research scientist (life sciences)  . Smokeless tobacco: Never Used  Substance Use Topics  . Alcohol use: No  . Drug use: No     Allergies   Amoxil [amoxicillin]; Chocolate; Demerol; Lisinopril; Meperidine; Meperidine hcl; and Other   Review of Systems Review of Systems  Constitutional:       Per HPI, otherwise negative  HENT:       Per HPI, otherwise negative   Respiratory:       Per HPI, otherwise negative  Cardiovascular:       Per HPI, otherwise negative  Gastrointestinal: Negative for vomiting.  Endocrine:       Negative aside from HPI  Genitourinary:       Neg aside from HPI   Musculoskeletal:       Per HPI, otherwise negative  Skin: Negative.   Neurological: Positive for weakness and headaches. Negative for syncope.     Physical Exam Updated Vital Signs BP (!) 168/75   Pulse 74   Temp (!) 97.4 F (36.3 C) (Oral)   Resp 16   SpO2 96%   Physical Exam  Constitutional: She is oriented to person, place, and time. She appears well-developed and well-nourished.  Obese elderly female resting in supine position with towel roll immobilization around her neck.  HENT:  Head: Normocephalic and atraumatic.  No appreciable deformity, but the patient describes pain throughout her posterior scalp  Eyes: Conjunctivae and EOM are normal.  Neck:  No appreciable deformity, patient does not move her neck secondary to pain.  Cardiovascular: Normal rate and regular rhythm.  Pulmonary/Chest: Effort normal and breath sounds normal. No stridor. No respiratory distress.  Abdominal: She exhibits no distension.  Musculoskeletal: She exhibits no edema.  Neurological: She is alert and oriented to person, place, and time. She displays atrophy. No cranial nerve deficit.  Left fourth finger with trigger deformity Grip strength diminished in both hands, 4/5 on left, 3/5 on right Proximal muscle strength 3/5 on the right, 4/5 on the left. Leg strength symmetric, 4/5 bilaterally, proximal.  Skin: Skin is warm and dry.  Psychiatric: Her mood appears anxious.  Nursing note and vitals reviewed.    ED Treatments / Results  Labs (all labs ordered are listed, but only abnormal results are displayed) Labs Reviewed  BASIC METABOLIC PANEL - Abnormal; Notable for the following components:      Result Value   CO2 21 (*)    Glucose, Bld 204 (*)    All other  components within normal limits  CBC WITH DIFFERENTIAL/PLATELET - Abnormal; Notable for the following components:   WBC 19.0 (*)  Neutro Abs 16.8 (*)    Abs Immature Granulocytes 0.12 (*)    All other components within normal limits  URINALYSIS, ROUTINE W REFLEX MICROSCOPIC - Abnormal; Notable for the following components:   Glucose, UA 50 (*)    Hgb urine dipstick MODERATE (*)    Protein, ur 100 (*)    Nitrite POSITIVE (*)    Bacteria, UA RARE (*)    All other components within normal limits  PROTIME-INR    Radiology Dg Shoulder Right  Result Date: 01/21/2018 CLINICAL DATA:  Fall EXAM: RIGHT SHOULDER - 2+ VIEW COMPARISON:  07/19/2011 FINDINGS: Negative for fracture or dislocation. Mild degenerative change in the [facility] joint and shoulder joint. Mild associated spurring. IMPRESSION: Negative for fracture or dislocation. Electronically Signed   By: Franchot Gallo M.D.   On: 01/21/2018 14:59   Ct Head Wo Contrast  Result Date: 01/21/2018 CLINICAL DATA:  Patient status post C7-T1 fusion 10/24/2017. History of prior C4-6 fusion. Status post fall off a porch today. Limited range of motion of the head and new tingling in the left fingers since the fall. Initial encounter. EXAM: CT HEAD WITHOUT CONTRAST CT CERVICAL SPINE WITHOUT CONTRAST TECHNIQUE: Multidetector CT imaging of the head and cervical spine was performed following the standard protocol without intravenous contrast. Multiplanar CT image reconstructions of the cervical spine were also generated. COMPARISON:  Brain MRI 01/12/2018. Cervical spine CT scan 12/03/2017. Head CT 09/21/2017. FINDINGS: CT HEAD FINDINGS Brain: No evidence of acute infarction, hemorrhage, hydrocephalus, extra-axial collection or mass lesion/mass effect. Vascular: No hyperdense vessel or unexpected calcification. Skull: Intact.  No focal lesion. Sinuses/Orbits: Small mucous retention cyst or polyp right maxillary sinus is unchanged. The patient is status post cataract  surgery bilaterally. Other: None. CT CERVICAL SPINE FINDINGS Alignment: Approximately 0.4 cm anterolisthesis C7 on T1 is unchanged since the prior CT. Alignment is otherwise maintained. Skull base and vertebrae: The patient has an acute fracture of C7. The fracture extends from the left side of the vertebral body into the left C7 facet and base of the left transverse process. The fracture is minimally displaced at 1-2 mm. No other fracture is identified. The interbody spacer and C7 appears to be incorporating into the endplates. Soft tissues and spinal canal: No epidural hematoma is identified. Disc levels: Loss of disc space height and endplate spurring at O1-3 appear unchanged. Upper chest: Lung apices are clear. Other: None. IMPRESSION: Acute fracture of the left side of the C7 vertebral body extends into the left facet and base of the left transverse process. No other acute abnormality is identified. 0.4 cm anterolisthesis C7 on T1 is unchanged. Status post C4-6 and C7-T1 fusion. No acute intracranial normality. Electronically Signed   By: Inge Rise M.D.   On: 01/21/2018 15:32   Ct Cervical Spine Wo Contrast  Result Date: 01/21/2018 CLINICAL DATA:  Patient status post C7-T1 fusion 10/24/2017. History of prior C4-6 fusion. Status post fall off a porch today. Limited range of motion of the head and new tingling in the left fingers since the fall. Initial encounter. EXAM: CT HEAD WITHOUT CONTRAST CT CERVICAL SPINE WITHOUT CONTRAST TECHNIQUE: Multidetector CT imaging of the head and cervical spine was performed following the standard protocol without intravenous contrast. Multiplanar CT image reconstructions of the cervical spine were also generated. COMPARISON:  Brain MRI 01/12/2018. Cervical spine CT scan 12/03/2017. Head CT 09/21/2017. FINDINGS: CT HEAD FINDINGS Brain: No evidence of acute infarction, hemorrhage, hydrocephalus, extra-axial collection or mass lesion/mass effect. Vascular: No hyperdense  vessel or unexpected calcification. Skull: Intact.  No focal lesion. Sinuses/Orbits: Small mucous retention cyst or polyp right maxillary sinus is unchanged. The patient is status post cataract surgery bilaterally. Other: None. CT CERVICAL SPINE FINDINGS Alignment: Approximately 0.4 cm anterolisthesis C7 on T1 is unchanged since the prior CT. Alignment is otherwise maintained. Skull base and vertebrae: The patient has an acute fracture of C7. The fracture extends from the left side of the vertebral body into the left C7 facet and base of the left transverse process. The fracture is minimally displaced at 1-2 mm. No other fracture is identified. The interbody spacer and C7 appears to be incorporating into the endplates. Soft tissues and spinal canal: No epidural hematoma is identified. Disc levels: Loss of disc space height and endplate spurring at U9-8 appear unchanged. Upper chest: Lung apices are clear. Other: None. IMPRESSION: Acute fracture of the left side of the C7 vertebral body extends into the left facet and base of the left transverse process. No other acute abnormality is identified. 0.4 cm anterolisthesis C7 on T1 is unchanged. Status post C4-6 and C7-T1 fusion. No acute intracranial normality. Electronically Signed   By: Inge Rise M.D.   On: 01/21/2018 15:32   Dg Shoulder Left  Result Date: 01/21/2018 CLINICAL DATA:  [place] down stairs EXAM: LEFT SHOULDER - 2+ VIEW COMPARISON:  None. FINDINGS: There is no evidence of fracture or dislocation. There is no evidence of arthropathy or other focal bone abnormality. Soft tissues are unremarkable. IMPRESSION: Negative. Electronically Signed   By: Franchot Gallo M.D.   On: 01/21/2018 15:00    Procedures Procedures (including critical care time)  Medications Ordered in ED Medications  ciprofloxacin (CIPRO) IVPB 400 mg (has no administration in time range)  HYDROmorphone (DILAUDID) injection 1 mg (1 mg Intravenous Given 01/21/18 1432)   HYDROmorphone (DILAUDID) injection 1 mg (1 mg Intravenous Given 01/21/18 1726)     Initial Impression / Assessment and Plan / ED Course  I have reviewed the triage vital signs and the nursing notes.  Pertinent labs & imaging results that were available during my care of the patient were reviewed by me and considered in my medical decision making (see chart for details).     Update: With persistent pain the patient will have Dilaudid in place of fentanyl for control.  Update:, I discussed the patient's abnormal CT finding with our radiologist, and given concern for C7 vertebral body fracture, I discussed it with our neurosurgeon as well. Patient does not have need for emergent surgery, but will continue to have the Aspen collar she received here in the emergency department in place.    6:40 PM Remaining labs notable for urinary tract infection. With concern for weakness, both at home before the fall, and now, after an attempt to sitting upright, patient will require Cipro, fluids, admission for further evaluation and management.   Final Clinical Impressions(s) / ED Diagnoses  Fall, initial encounter Cervical spine fracture Urinary tract infection  CRITICAL CARE Performed by: Carmin Muskrat Total critical care time: 40 minutes Critical care time was exclusive of separately billable procedures and treating other patients. Critical care was necessary to treat or prevent imminent or life-threatening deterioration. Critical care was time spent personally by me on the following activities: development of treatment plan with patient and/or surrogate as well as nursing, discussions with consultants, evaluation of patient's response to treatment, examination of patient, obtaining history from patient or surrogate, ordering and performing treatments and interventions, ordering and review of  laboratory studies, ordering and review of radiographic studies, pulse oximetry and re-evaluation of  patient's condition.    Carmin Muskrat, MD 01/21/18 [phone]

## 2018-01-22 ENCOUNTER — Inpatient Hospital Stay (HOSPITAL_COMMUNITY)
Admission: EM | Disposition: A | Discharge status: Skilled Nursing Facility | Payer: Self-pay | Source: Home / Self Care | Attending: Internal Medicine

## 2018-01-22 ENCOUNTER — Observation Stay (HOSPITAL_COMMUNITY): Payer: PPO

## 2018-01-22 ENCOUNTER — Inpatient Hospital Stay (HOSPITAL_COMMUNITY): Payer: PPO | Admitting: Anesthesiology

## 2018-01-22 ENCOUNTER — Inpatient Hospital Stay (HOSPITAL_COMMUNITY): Payer: PPO

## 2018-01-22 ENCOUNTER — Encounter (HOSPITAL_COMMUNITY): Payer: Self-pay | Admitting: *Deleted

## 2018-01-22 ENCOUNTER — Other Ambulatory Visit: Payer: Self-pay | Admitting: Neurological Surgery

## 2018-01-22 DIAGNOSIS — I5032 Chronic diastolic (congestive) heart failure: Secondary | ICD-10-CM | POA: Diagnosis present

## 2018-01-22 DIAGNOSIS — N319 Neuromuscular dysfunction of bladder, unspecified: Secondary | ICD-10-CM | POA: Diagnosis not present

## 2018-01-22 DIAGNOSIS — W1789XA Other fall from one level to another, initial encounter: Secondary | ICD-10-CM | POA: Diagnosis present

## 2018-01-22 DIAGNOSIS — D62 Acute posthemorrhagic anemia: Secondary | ICD-10-CM | POA: Diagnosis not present

## 2018-01-22 DIAGNOSIS — K5900 Constipation, unspecified: Secondary | ICD-10-CM | POA: Diagnosis not present

## 2018-01-22 DIAGNOSIS — N390 Urinary tract infection, site not specified: Secondary | ICD-10-CM | POA: Diagnosis present

## 2018-01-22 DIAGNOSIS — G2581 Restless legs syndrome: Secondary | ICD-10-CM | POA: Diagnosis present

## 2018-01-22 DIAGNOSIS — M810 Age-related osteoporosis without current pathological fracture: Secondary | ICD-10-CM | POA: Diagnosis present

## 2018-01-22 DIAGNOSIS — E222 Syndrome of inappropriate secretion of antidiuretic hormone: Secondary | ICD-10-CM | POA: Diagnosis not present

## 2018-01-22 DIAGNOSIS — S12600A Unspecified displaced fracture of seventh cervical vertebra, initial encounter for closed fracture: Secondary | ICD-10-CM | POA: Diagnosis present

## 2018-01-22 DIAGNOSIS — S12601A Unspecified nondisplaced fracture of seventh cervical vertebra, initial encounter for closed fracture: Secondary | ICD-10-CM | POA: Diagnosis not present

## 2018-01-22 DIAGNOSIS — E1140 Type 2 diabetes mellitus with diabetic neuropathy, unspecified: Secondary | ICD-10-CM | POA: Diagnosis present

## 2018-01-22 DIAGNOSIS — I110 Hypertensive heart disease with heart failure: Secondary | ICD-10-CM | POA: Diagnosis present

## 2018-01-22 DIAGNOSIS — M479 Spondylosis, unspecified: Secondary | ICD-10-CM | POA: Diagnosis present

## 2018-01-22 DIAGNOSIS — G8250 Quadriplegia, unspecified: Secondary | ICD-10-CM | POA: Diagnosis present

## 2018-01-22 DIAGNOSIS — Y92008 Other place in unspecified non-institutional (private) residence as the place of occurrence of the external cause: Secondary | ICD-10-CM | POA: Diagnosis not present

## 2018-01-22 DIAGNOSIS — S129XXA Fracture of neck, unspecified, initial encounter: Secondary | ICD-10-CM | POA: Diagnosis present

## 2018-01-22 DIAGNOSIS — K219 Gastro-esophageal reflux disease without esophagitis: Secondary | ICD-10-CM | POA: Diagnosis present

## 2018-01-22 DIAGNOSIS — S14109A Unspecified injury at unspecified level of cervical spinal cord, initial encounter: Secondary | ICD-10-CM | POA: Diagnosis present

## 2018-01-22 DIAGNOSIS — W19XXXA Unspecified fall, initial encounter: Secondary | ICD-10-CM | POA: Diagnosis not present

## 2018-01-22 DIAGNOSIS — K592 Neurogenic bowel, not elsewhere classified: Secondary | ICD-10-CM | POA: Diagnosis not present

## 2018-01-22 DIAGNOSIS — S14156A Other incomplete lesion at C6 level of cervical spinal cord, initial encounter: Secondary | ICD-10-CM | POA: Diagnosis not present

## 2018-01-22 DIAGNOSIS — S140XXA Concussion and edema of cervical spinal cord, initial encounter: Secondary | ICD-10-CM | POA: Diagnosis present

## 2018-01-22 DIAGNOSIS — M797 Fibromyalgia: Secondary | ICD-10-CM | POA: Diagnosis present

## 2018-01-22 DIAGNOSIS — I471 Supraventricular tachycardia: Secondary | ICD-10-CM | POA: Diagnosis present

## 2018-01-22 HISTORY — PX: POSTERIOR CERVICAL FUSION/FORAMINOTOMY: SHX5038

## 2018-01-22 LAB — BASIC METABOLIC PANEL
Anion gap: 13 (ref 5–15)
BUN: 19 mg/dL (ref 8–23)
CO2: 22 mmol/L (ref 22–32)
Calcium: 9.1 mg/dL (ref 8.9–10.3)
Chloride: 98 mmol/L (ref 98–111)
Creatinine, Ser: 0.84 mg/dL (ref 0.44–1.00)
GFR calc Af Amer: >60 mL/min (ref >60)
GFR calc non Af Amer: >60 mL/min (ref >60)
Glucose, Bld: 189 mg/dL — ABNORMAL HIGH (ref 70–99)
Potassium: 4.4 mmol/L (ref 3.5–5.1)
Sodium: 133 mmol/L — ABNORMAL LOW (ref 135–145)

## 2018-01-22 LAB — CBC
HCT: 38.8 % (ref 36.0–46.0)
Hemoglobin: 12.8 g/dL (ref 12.0–15.0)
MCH: 28.3 pg (ref 26.0–34.0)
MCHC: 33 g/dL (ref 30.0–36.0)
MCV: 85.7 fL (ref 80.0–100.0)
Platelets: 181 10*3/uL (ref 150–400)
RBC: 4.53 MIL/uL (ref 3.87–5.11)
RDW: 13.2 % (ref 11.5–15.5)
WBC: 10.2 10*3/uL (ref 4.0–10.5)
nRBC: 0 % (ref 0.0–0.2)

## 2018-01-22 LAB — CK
Total CK: 1873 U/L — ABNORMAL HIGH (ref 38–234)
Total CK: 1525 U/L — ABNORMAL HIGH (ref 38–234)

## 2018-01-22 LAB — TROPONIN I
Troponin I: 0.59 ng/mL (ref <0.03)
Troponin I: 0.54 ng/mL (ref <0.03)
Troponin I: 0.36 ng/mL (ref <0.03)

## 2018-01-22 LAB — GLUCOSE, CAPILLARY
Glucose-Capillary: 177 mg/dL — ABNORMAL HIGH (ref 70–99)
Glucose-Capillary: 174 mg/dL — ABNORMAL HIGH (ref 70–99)
Glucose-Capillary: 183 mg/dL — ABNORMAL HIGH (ref 70–99)
Glucose-Capillary: 226 mg/dL — ABNORMAL HIGH (ref 70–99)
Glucose-Capillary: 213 mg/dL — ABNORMAL HIGH (ref 70–99)
Glucose-Capillary: 198 mg/dL — ABNORMAL HIGH (ref 70–99)

## 2018-01-22 LAB — DIGOXIN LEVEL: Digoxin Level: 0.5 ng/mL — ABNORMAL LOW (ref 0.8–2.0)

## 2018-01-22 LAB — SURGICAL PCR SCREEN
MRSA, PCR: NEGATIVE
Staphylococcus aureus: NEGATIVE

## 2018-01-22 LAB — MAGNESIUM: Magnesium: 1.6 mg/dL — ABNORMAL LOW (ref 1.7–2.4)

## 2018-01-22 SURGERY — POSTERIOR CERVICAL FUSION/FORAMINOTOMY LEVEL 4
Anesthesia: General | Site: Spine Cervical

## 2018-01-22 MED ORDER — FENTANYL CITRATE (PF) 250 MCG/5ML IJ SOLN
INTRAMUSCULAR | Status: AC
  Filled 2018-01-22: qty 5

## 2018-01-22 MED ORDER — SODIUM CHLORIDE (PF) 0.9 % IJ SOLN
INTRAMUSCULAR | Status: AC
  Filled 2018-01-22: qty 10

## 2018-01-22 MED ORDER — LIDOCAINE 2% (20 MG/ML) 5 ML SYRINGE
INTRAMUSCULAR | Status: AC
  Filled 2018-01-22: qty 5

## 2018-01-22 MED ORDER — VANCOMYCIN HCL 1000 MG IV SOLR
INTRAVENOUS | Status: DC | PRN
  Administered 2018-01-22: 1000 mg via INTRAVENOUS

## 2018-01-22 MED ORDER — DEXAMETHASONE SODIUM PHOSPHATE 10 MG/ML IJ SOLN
20.0000 mg | Freq: Once | INTRAMUSCULAR | Status: AC
  Administered 2018-01-22: 20 mg via INTRAVENOUS
  Filled 2018-01-22: qty 2

## 2018-01-22 MED ORDER — BUPIVACAINE HCL (PF) 0.5 % IJ SOLN
INTRAMUSCULAR | Status: DC | PRN
  Administered 2018-01-22: 5 mL

## 2018-01-22 MED ORDER — ONDANSETRON HCL 4 MG/2ML IJ SOLN
INTRAMUSCULAR | Status: DC | PRN
  Administered 2018-01-22: 4 mg via INTRAVENOUS

## 2018-01-22 MED ORDER — DILTIAZEM HCL 25 MG/5ML IV SOLN
10.0000 mg | Freq: Four times a day (QID) | INTRAVENOUS | Status: DC | PRN
  Filled 2018-01-22: qty 5

## 2018-01-22 MED ORDER — VECURONIUM BROMIDE 10 MG IV SOLR
INTRAVENOUS | Status: AC
  Filled 2018-01-22: qty 20

## 2018-01-22 MED ORDER — LIDOCAINE-EPINEPHRINE 1 %-1:100000 IJ SOLN
INTRAMUSCULAR | Status: AC
  Filled 2018-01-22: qty 1

## 2018-01-22 MED ORDER — MIDAZOLAM HCL 5 MG/5ML IJ SOLN
INTRAMUSCULAR | Status: DC | PRN
  Administered 2018-01-22 (×2): 1 mg via INTRAVENOUS

## 2018-01-22 MED ORDER — LIDOCAINE 2% (20 MG/ML) 5 ML SYRINGE
INTRAMUSCULAR | Status: DC | PRN
  Administered 2018-01-22: 100 mg via INTRAVENOUS

## 2018-01-22 MED ORDER — INSULIN ASPART 100 UNIT/ML ~~LOC~~ SOLN
SUBCUTANEOUS | Status: AC
  Filled 2018-01-22: qty 1

## 2018-01-22 MED ORDER — CEPHALEXIN 500 MG PO CAPS
500.0000 mg | ORAL_CAPSULE | Freq: Two times a day (BID) | ORAL | Status: AC
  Administered 2018-01-23 – 2018-01-25 (×5): 500 mg via ORAL
  Filled 2018-01-22 (×5): qty 1

## 2018-01-22 MED ORDER — HYDROMORPHONE HCL 1 MG/ML IJ SOLN
0.2500 mg | INTRAMUSCULAR | Status: DC | PRN
  Administered 2018-01-22: 0.25 mg via INTRAVENOUS

## 2018-01-22 MED ORDER — LIDOCAINE-EPINEPHRINE 1 %-1:100000 IJ SOLN
INTRAMUSCULAR | Status: DC | PRN
  Administered 2018-01-22: 5 mL

## 2018-01-22 MED ORDER — SUGAMMADEX SODIUM 500 MG/5ML IV SOLN
INTRAVENOUS | Status: AC
  Filled 2018-01-22: qty 5

## 2018-01-22 MED ORDER — SODIUM CHLORIDE 0.9 % IV SOLN
INTRAVENOUS | Status: DC
  Administered 2018-01-22: 11:00:00 via INTRAVENOUS

## 2018-01-22 MED ORDER — HYDRALAZINE HCL 20 MG/ML IJ SOLN: 10.0000 mg | Freq: Four times a day (QID) | INTRAMUSCULAR | Status: DC | PRN

## 2018-01-22 MED ORDER — INSULIN GLARGINE 100 UNIT/ML ~~LOC~~ SOLN
25.0000 [IU] | Freq: Two times a day (BID) | SUBCUTANEOUS | Status: DC
  Administered 2018-01-23 – 2018-01-29 (×13): 25 [IU] via SUBCUTANEOUS
  Filled 2018-01-22 (×14): qty 0.25

## 2018-01-22 MED ORDER — LACTATED RINGERS IV SOLN
INTRAVENOUS | Status: DC
  Administered 2018-01-24 – 2018-01-26 (×2): via INTRAVENOUS

## 2018-01-22 MED ORDER — SODIUM CHLORIDE 0.9 % IV SOLN
INTRAVENOUS | Status: DC
  Administered 2018-01-22: 09:00:00 via INTRAVENOUS

## 2018-01-22 MED ORDER — LORAZEPAM 2 MG/ML IJ SOLN: 1.0000 mg | Freq: Once | INTRAMUSCULAR | Status: DC | PRN

## 2018-01-22 MED ORDER — MUPIROCIN 2 % EX OINT
TOPICAL_OINTMENT | CUTANEOUS | Status: AC
  Administered 2018-01-22: 18:00:00
  Filled 2018-01-22: qty 22

## 2018-01-22 MED ORDER — THROMBIN 5000 UNITS EX SOLR
CUTANEOUS | Status: AC
  Filled 2018-01-22: qty 5000

## 2018-01-22 MED ORDER — SUGAMMADEX SODIUM 200 MG/2ML IV SOLN
INTRAVENOUS | Status: DC | PRN
  Administered 2018-01-22: 300 mg via INTRAVENOUS

## 2018-01-22 MED ORDER — PROPOFOL 10 MG/ML IV BOLUS
INTRAVENOUS | Status: DC | PRN
  Administered 2018-01-22: 60 mg via INTRAVENOUS
  Administered 2018-01-22: 140 mg via INTRAVENOUS

## 2018-01-22 MED ORDER — VECURONIUM BROMIDE 10 MG IV SOLR
INTRAVENOUS | Status: DC | PRN
  Administered 2018-01-22: 3 mg via INTRAVENOUS

## 2018-01-22 MED ORDER — SODIUM CHLORIDE 0.9 % IV SOLN
INTRAVENOUS | Status: DC | PRN
  Administered 2018-01-22: 20:00:00

## 2018-01-22 MED ORDER — ONDANSETRON HCL 4 MG/2ML IJ SOLN
INTRAMUSCULAR | Status: AC
  Filled 2018-01-22: qty 2

## 2018-01-22 MED ORDER — ARTIFICIAL TEARS OPHTHALMIC OINT
TOPICAL_OINTMENT | OPHTHALMIC | Status: AC
  Filled 2018-01-22: qty 3.5

## 2018-01-22 MED ORDER — SUCCINYLCHOLINE CHLORIDE 200 MG/10ML IV SOSY
PREFILLED_SYRINGE | INTRAVENOUS | Status: AC
  Filled 2018-01-22: qty 10

## 2018-01-22 MED ORDER — LACTATED RINGERS IV SOLN
INTRAVENOUS | Status: DC | PRN
  Administered 2018-01-22 (×2): via INTRAVENOUS

## 2018-01-22 MED ORDER — SODIUM CHLORIDE 0.9 % IV SOLN
INTRAVENOUS | Status: DC | PRN
  Administered 2018-01-22: 30 ug/min via INTRAVENOUS

## 2018-01-22 MED ORDER — HYDROMORPHONE HCL 1 MG/ML IJ SOLN
0.5000 mg | Freq: Once | INTRAMUSCULAR | Status: AC
  Administered 2018-01-22: 0.5 mg via INTRAVENOUS
  Filled 2018-01-22: qty 0.5

## 2018-01-22 MED ORDER — VANCOMYCIN HCL IN DEXTROSE 1-5 GM/200ML-% IV SOLN
INTRAVENOUS | Status: AC
  Filled 2018-01-22: qty 200

## 2018-01-22 MED ORDER — NALOXONE HCL 0.4 MG/ML IJ SOLN
0.4000 mg | Freq: Once | INTRAMUSCULAR | Status: AC
  Administered 2018-01-22: 0.4 mg via INTRAVENOUS
  Filled 2018-01-22: qty 1

## 2018-01-22 MED ORDER — PHENYLEPHRINE 40 MCG/ML (10ML) SYRINGE FOR IV PUSH (FOR BLOOD PRESSURE SUPPORT)
PREFILLED_SYRINGE | INTRAVENOUS | Status: DC | PRN
  Administered 2018-01-22: 120 ug via INTRAVENOUS

## 2018-01-22 MED ORDER — MIDAZOLAM HCL 2 MG/2ML IJ SOLN
INTRAMUSCULAR | Status: AC
  Filled 2018-01-22: qty 2

## 2018-01-22 MED ORDER — 0.9 % SODIUM CHLORIDE (POUR BTL) OPTIME
TOPICAL | Status: DC | PRN
  Administered 2018-01-22: 1000 mL

## 2018-01-22 MED ORDER — FENTANYL CITRATE (PF) 100 MCG/2ML IJ SOLN
INTRAMUSCULAR | Status: DC | PRN
  Administered 2018-01-22: 150 ug via INTRAVENOUS
  Administered 2018-01-22: 50 ug via INTRAVENOUS
  Administered 2018-01-22: 100 ug via INTRAVENOUS

## 2018-01-22 MED ORDER — LACTATED RINGERS IV SOLN
INTRAVENOUS | Status: DC | PRN
  Administered 2018-01-22: 19:00:00 via INTRAVENOUS

## 2018-01-22 MED ORDER — ONDANSETRON HCL 4 MG/2ML IJ SOLN: 4.0000 mg | Freq: Once | INTRAMUSCULAR | Status: DC | PRN

## 2018-01-22 MED ORDER — HYDROMORPHONE HCL 1 MG/ML IJ SOLN
INTRAMUSCULAR | Status: AC
  Filled 2018-01-22: qty 1

## 2018-01-22 MED ORDER — EPHEDRINE 5 MG/ML INJ
INTRAVENOUS | Status: AC
  Filled 2018-01-22: qty 10

## 2018-01-22 MED ORDER — THROMBIN 5000 UNITS EX SOLR
OROMUCOSAL | Status: DC | PRN
  Administered 2018-01-22 (×2): via TOPICAL

## 2018-01-22 MED ORDER — ROCURONIUM BROMIDE 50 MG/5ML IV SOSY
PREFILLED_SYRINGE | INTRAVENOUS | Status: AC
  Filled 2018-01-22: qty 10

## 2018-01-22 MED ORDER — ROCURONIUM BROMIDE 10 MG/ML (PF) SYRINGE
PREFILLED_SYRINGE | INTRAVENOUS | Status: DC | PRN
  Administered 2018-01-22: 30 mg via INTRAVENOUS
  Administered 2018-01-22: 50 mg via INTRAVENOUS
  Administered 2018-01-22: 20 mg via INTRAVENOUS

## 2018-01-22 MED ORDER — PROPOFOL 10 MG/ML IV BOLUS
INTRAVENOUS | Status: AC
  Filled 2018-01-22: qty 20

## 2018-01-22 MED ORDER — METHOCARBAMOL 1000 MG/10ML IJ SOLN
500.0000 mg | Freq: Once | INTRAVENOUS | Status: AC
  Administered 2018-01-22: 500 mg via INTRAVENOUS
  Filled 2018-01-22: qty 5

## 2018-01-22 MED ORDER — DIPHENHYDRAMINE HCL 50 MG/ML IJ SOLN: 25.0000 mg | Freq: Once | INTRAMUSCULAR | Status: DC | PRN

## 2018-01-22 MED ORDER — EPHEDRINE SULFATE-NACL 50-0.9 MG/10ML-% IV SOSY
PREFILLED_SYRINGE | INTRAVENOUS | Status: DC | PRN
  Administered 2018-01-22: 10 mg via INTRAVENOUS

## 2018-01-22 MED ORDER — BUPIVACAINE HCL (PF) 0.5 % IJ SOLN
INTRAMUSCULAR | Status: AC
  Filled 2018-01-22: qty 30

## 2018-01-22 MED ORDER — PHENYLEPHRINE 40 MCG/ML (10ML) SYRINGE FOR IV PUSH (FOR BLOOD PRESSURE SUPPORT)
PREFILLED_SYRINGE | INTRAVENOUS | Status: AC
  Filled 2018-01-22: qty 10

## 2018-01-22 MED ORDER — HYDROMORPHONE HCL 1 MG/ML IJ SOLN
0.5000 mg | INTRAMUSCULAR | Status: DC | PRN
  Administered 2018-01-22: 0.5 mg via INTRAVENOUS
  Filled 2018-01-22 (×2): qty 0.5

## 2018-01-22 MED ORDER — METOPROLOL TARTRATE 5 MG/5ML IV SOLN: 5.0000 mg | Freq: Four times a day (QID) | INTRAVENOUS | Status: DC | PRN

## 2018-01-22 MED ORDER — DEXAMETHASONE SODIUM PHOSPHATE 10 MG/ML IJ SOLN
INTRAMUSCULAR | Status: AC
  Filled 2018-01-22: qty 1

## 2018-01-22 SURGICAL SUPPLY — 71 items
ADH SKN CLS APL DERMABOND .7 (GAUZE/BANDAGES/DRESSINGS) ×1
APL SKNCLS STERI-STRIP NONHPOA (GAUZE/BANDAGES/DRESSINGS) ×1
BAG DECANTER FOR FLEXI CONT (MISCELLANEOUS) ×2 IMPLANT
BASKET BONE COLLECTION (BASKET) ×1 IMPLANT
BENZOIN TINCTURE PRP APPL 2/3 (GAUZE/BANDAGES/DRESSINGS) ×1 IMPLANT
BIT DRILL NEURO 2X3.1 SFT TUCH (MISCELLANEOUS) ×1 IMPLANT
BIT DRILL VUEPOINT II (BIT) IMPLANT
BLADE CLIPPER SURG (BLADE) ×1 IMPLANT
CANISTER SUCT 3000ML PPV (MISCELLANEOUS) ×2 IMPLANT
COVER WAND RF STERILE (DRAPES) ×1 IMPLANT
DERMABOND ADVANCED (GAUZE/BANDAGES/DRESSINGS) ×1
DERMABOND ADVANCED .7 DNX12 (GAUZE/BANDAGES/DRESSINGS) IMPLANT
DEVICE DISSECT PLASMABLAD 3.0S (MISCELLANEOUS) ×1 IMPLANT
DRAPE C-ARM 42X72 X-RAY (DRAPES) ×4 IMPLANT
DRAPE LAPAROTOMY 100X72 PEDS (DRAPES) ×2 IMPLANT
DRAPE MICROSCOPE LEICA (MISCELLANEOUS) IMPLANT
DRILL BIT VUEPOINT II (BIT) ×2
DRILL NEURO 2X3.1 SOFT TOUCH (MISCELLANEOUS) ×2
DRSG OPSITE POSTOP 4X8 (GAUZE/BANDAGES/DRESSINGS) ×1 IMPLANT
DURAPREP 26ML APPLICATOR (WOUND CARE) ×2 IMPLANT
ELECT REM PT RETURN 9FT ADLT (ELECTROSURGICAL) ×2
ELECTRODE REM PT RTRN 9FT ADLT (ELECTROSURGICAL) ×1 IMPLANT
EVACUATOR 1/8 PVC DRAIN (DRAIN) ×1 IMPLANT
GAUZE 4X4 16PLY RFD (DISPOSABLE) IMPLANT
GAUZE SPONGE 4X4 12PLY STRL (GAUZE/BANDAGES/DRESSINGS) ×1 IMPLANT
GLOVE BIOGEL PI IND STRL 6.5 (GLOVE) IMPLANT
GLOVE BIOGEL PI IND STRL 7.0 (GLOVE) IMPLANT
GLOVE BIOGEL PI IND STRL 7.5 (GLOVE) IMPLANT
GLOVE BIOGEL PI IND STRL 8.5 (GLOVE) ×1 IMPLANT
GLOVE BIOGEL PI INDICATOR 6.5 (GLOVE) ×2
GLOVE BIOGEL PI INDICATOR 7.0 (GLOVE) ×2
GLOVE BIOGEL PI INDICATOR 7.5 (GLOVE) ×2
GLOVE BIOGEL PI INDICATOR 8.5 (GLOVE) ×2
GLOVE ECLIPSE 8.5 STRL (GLOVE) ×3 IMPLANT
GLOVE SS N UNI LF 6.0 STRL (GLOVE) ×3 IMPLANT
GOWN STRL REUS W/ TWL LRG LVL3 (GOWN DISPOSABLE) IMPLANT
GOWN STRL REUS W/ TWL XL LVL3 (GOWN DISPOSABLE) ×1 IMPLANT
GOWN STRL REUS W/TWL 2XL LVL3 (GOWN DISPOSABLE) ×3 IMPLANT
GOWN STRL REUS W/TWL LRG LVL3 (GOWN DISPOSABLE) ×2
GOWN STRL REUS W/TWL XL LVL3 (GOWN DISPOSABLE)
HEMOSTAT POWDER KIT SURGIFOAM (HEMOSTASIS) ×2 IMPLANT
KIT BASIN OR (CUSTOM PROCEDURE TRAY) ×3 IMPLANT
KIT TURNOVER KIT B (KITS) ×2 IMPLANT
MILL MEDIUM DISP (BLADE) ×1 IMPLANT
NDL SPNL 18GX3.5 QUINCKE PK (NEEDLE) IMPLANT
NEEDLE HYPO 22GX1.5 SAFETY (NEEDLE) ×2 IMPLANT
NEEDLE SPNL 18GX3.5 QUINCKE PK (NEEDLE) ×2 IMPLANT
NS IRRIG 1000ML POUR BTL (IV SOLUTION) ×2 IMPLANT
PACK LAMINECTOMY NEURO (CUSTOM PROCEDURE TRAY) ×2 IMPLANT
PAD ARMBOARD 7.5X6 YLW CONV (MISCELLANEOUS) ×6 IMPLANT
PIN MAYFIELD SKULL DISP (PIN) ×2 IMPLANT
PLASMABLADE 3.0S (MISCELLANEOUS) ×2
ROD VUEPOINT 80MM (Rod) ×2 IMPLANT
RUBBERBAND STERILE (MISCELLANEOUS) IMPLANT
SCREW MA MM 3.5X14 (Screw) ×6 IMPLANT
SCREW MA VUEPOINT II 4X28 (Screw) ×2 IMPLANT
SCREW SET THREADED (Screw) ×10 IMPLANT
SCREW VUEPOINT 3.5X18MM (Screw) ×2 IMPLANT
SPONGE LAP 4X18 RFD (DISPOSABLE) IMPLANT
STAPLER SKIN PROX WIDE 3.9 (STAPLE) ×2 IMPLANT
STRIP CLOSURE SKIN 1/2X4 (GAUZE/BANDAGES/DRESSINGS) IMPLANT
SUT ETHILON 3 0 FSL (SUTURE) IMPLANT
SUT VIC AB 0 CT1 18XCR BRD8 (SUTURE) ×1 IMPLANT
SUT VIC AB 0 CT1 8-18 (SUTURE) ×4
SUT VIC AB 2-0 CP2 18 (SUTURE) ×3 IMPLANT
SUT VIC AB 3-0 SH 8-18 (SUTURE) ×2 IMPLANT
SYRINGE 3CC LL L/F (MISCELLANEOUS) ×4 IMPLANT
TOWEL GREEN STERILE (TOWEL DISPOSABLE) ×2 IMPLANT
TOWEL GREEN STERILE FF (TOWEL DISPOSABLE) ×2 IMPLANT
TRAY FOLEY MTR SLVR 16FR STAT (SET/KITS/TRAYS/PACK) ×1 IMPLANT
WATER STERILE IRR 1000ML POUR (IV SOLUTION) ×2 IMPLANT

## 2018-01-22 NOTE — Progress Notes (Signed)
PT Cancellation Note  Patient Details Name: [name] MRN: [id number] DOB: Apr 10, 1940   Cancelled Treatment:    Reason Eval/Treat Not Completed: Medical issues which prohibited therapy   Orders received, chart reviewed; Discussed pt with Lonn Georgia, RN;  Status change this morning, warranting Neurosurgery reconsult and re-imaging;   Will hold and check back for appropriateness of PT after Neurosurgery reconsult.  Roney Marion, [place]  Acute Rehabilitation Services Pager [phone] Office [phone]    Colletta Maryland 01/22/2018, 11:06 AM

## 2018-01-22 NOTE — Anesthesia Preprocedure Evaluation (Addendum)
Anesthesia Evaluation  Patient identified by MRN, date of birth, ID band Patient awake    Reviewed: Allergy & Precautions, NPO status , Patient's Chart, lab work & pertinent test results  History of Anesthesia Complications (+) PONV  Airway Mallampati: III  TM Distance: >3 FB Neck ROM: Limited    Dental  (+) Teeth Intact, Dental Advisory Given   Pulmonary former smoker,    Pulmonary exam normal        Cardiovascular hypertension, Pt. on medications Normal cardiovascular exam+ dysrhythmias      Neuro/Psych Anxiety Depression    GI/Hepatic GERD  Medicated and Controlled,  Endo/Other  diabetes, Type 2, Insulin Dependent  Renal/GU      Musculoskeletal   Abdominal   Peds  Hematology   Anesthesia Other Findings   Reproductive/Obstetrics                            Anesthesia Physical Anesthesia Plan  ASA: III and emergent  Anesthesia Plan: General   Post-op Pain Management:    Induction: Intravenous  PONV Risk Score and Plan: 4 or greater and Ondansetron, Dexamethasone, Midazolam and Treatment may vary due to age or medical condition  Airway Management Planned: Video Laryngoscope Planned and Oral ETT  Additional Equipment:   Intra-op Plan:   Post-operative Plan: Possible Post-op intubation/ventilation  Informed Consent: I have reviewed the patients History and Physical, chart, labs and discussed the procedure including the risks, benefits and alternatives for the proposed anesthesia with the patient or authorized representative who has indicated his/her understanding and acceptance.   Dental advisory given  Plan Discussed with: CRNA, Surgeon and Anesthesiologist  Anesthesia Plan Comments:        Anesthesia Quick Evaluation

## 2018-01-22 NOTE — Anesthesia Procedure Notes (Signed)
Procedure Name: Intubation Date/Time: 01/22/2018 7:05 PM Performed by: Clovis Cao, CRNA Pre-anesthesia Checklist: Patient identified, Emergency Drugs available, Suction available, Patient being monitored and Timeout performed Patient Re-evaluated:Patient Re-evaluated prior to induction Oxygen Delivery Method: Circle system utilized Preoxygenation: Pre-oxygenation with 100% oxygen (Cervical collar in situ. Head/neck maintained in neutral position throughout intubation) Induction Type: IV induction Ventilation: Mask ventilation without difficulty Laryngoscope Size: Glidescope (Elective Glide) Grade View: Grade I Tube type: Oral Tube size: 7.0 mm Number of attempts: 1 Airway Equipment and Method: Stylet and Video-laryngoscopy Placement Confirmation: ETT inserted through vocal cords under direct vision,  positive ETCO2 and breath sounds checked- equal and bilateral Secured at: 21 cm Tube secured with: Tape Dental Injury: Teeth and Oropharynx as per pre-operative assessment

## 2018-01-22 NOTE — Transfer of Care (Signed)
Immediate Anesthesia Transfer of Care Note  Patient: [name]  Procedure(s) Performed: Cervical six-seven Posterior Decompression with Posterior Fixation Cervical Five to Thoracic Two (N/A Spine Cervical)  Patient Location: PACU  Anesthesia Type:General  Level of Consciousness: drowsy  Airway & Oxygen Therapy: Patient Spontanous Breathing and Patient connected to face mask oxygen  Post-op Assessment: Report given to RN and Post -op Vital signs reviewed and stable  Post vital signs: Reviewed and stable  Last Vitals:  Vitals Value Taken Time  BP 89/78 01/22/2018 10:25 PM  Temp    Pulse 82 01/22/2018 10:26 PM  Resp 19 01/22/2018 10:26 PM  SpO2 100 % 01/22/2018 10:26 PM  Vitals shown include unvalidated device data.  Last Pain:  Vitals:   01/22/18 1738  TempSrc: Oral  PainSc:          Complications: No apparent anesthesia complications

## 2018-01-22 NOTE — Progress Notes (Signed)
Patient ID: [name], female   DOB: May 16, 1940, 77 y.o.   MRN: [id number] I have had the opportunity to review Magalene's MRI.  She has a substantial spinal cord contusion at the level of C6-C7.  Though the alignment appears neutral again yearly there is some abnormal motion at that level that caused her to compress her spinal cord.  Having noted that I have advised that she really needs to undergo posterior cervical decompression via laminectomy from C6-C7 to decompress the central portion of the spinal canal.  Then posterior stabilization needs to be undertaken from C5 to about the level of T2 to maintain the integrity of the alignment of her cervical spinal canal.  We are planning to do this this evening.  I discussed the situation and the risks and benefits to her daughters and surely she signed a consent we will proceed this evening.

## 2018-01-22 NOTE — Anesthesia Postprocedure Evaluation (Signed)
Anesthesia Post Note  Patient: [name]  Procedure(s) Performed: Cervical six-seven Posterior Decompression with Posterior Fixation Cervical Five to Thoracic Two (N/A Spine Cervical)     Patient location during evaluation: PACU Anesthesia Type: General Level of consciousness: awake and alert Pain management: pain level controlled Vital Signs Assessment: post-procedure vital signs reviewed and stable Respiratory status: spontaneous breathing, nonlabored ventilation, respiratory function stable and patient connected to nasal cannula oxygen Cardiovascular status: blood pressure returned to baseline and stable Postop Assessment: no apparent nausea or vomiting Anesthetic complications: no    Last Vitals:  Vitals:   01/22/18 0521 01/22/18 1738  BP: (!) 161/76 (!) 141/69  Pulse: 79 92  Resp: 20   Temp: 37.2 C 36.9 C  SpO2: 97% 96%    Last Pain:  Vitals:   01/22/18 1738  TempSrc: Oral  PainSc:                  [name] DAVID

## 2018-01-22 NOTE — Op Note (Signed)
Date of surgery: 01/22/2018 Preoperative diagnosis: Acute fracture dislocation C6-C7 with quadriplegia.  History of anterior cervical decompression C7-T1 September 2019.  History of previous fusion C4-C7 Postoperative diagnosis: Same Procedure: Posterior decompression with laminectomy C6-C7, posterior fixation C4-T1 with T1 pedicle screws and C4-C7 facet screws bilaterally posterior lateral arthrodesis with local autograft C6 and C7 Surgeon: [name] Anesthesia: General endotracheal Indications: [name] is a 78 year old lady who had had a previous anterior decompression arthrodesis at C7-T1 in September of this year she sustained a fall yesterday and had evidence of fracture of the C6-C7 interspace.  It was felt that this was initially stable however upon mobilizing the patient she became acutely quadriplegic.  She has a contusion or spinal cord she is now being taken to the operating room to undergo posterior decompression and posterior stabilization from C4-T1 Procedure: The patient was brought to the operating room supine on the stretcher.  After the smooth induction of general endotracheal anesthesia, she was placed in the 3 pin headrest and carefully turned prone onto the operating table with the head being secured in a [place] headrest.  The back of the neck was prepped with alcohol DuraPrep and draped in a sterile fashion after taping the shoulders inferiorly and out of the way cervical incision was created and carried down to the cervical dorsal fascia and the first spinous processes were noted be that of C3.  A subperiosteal dissection was then carried out down to C7 and it was noted that there was gross disruption of the posterior ligamentous structures from C5-C7.  These were carefully dissected down and some epidural blood clot was encountered under the arch of C7.  Laminectomy was then carried out at the C6-C7 interval.  The dura was decompressed dorsally and laterally.  Then facet  entry sites were chosen at C4-C5-C6 C7 and at T1 pedicle entry sites were chosen 4 mm x 28 mm long pedicle screws were placed in T1 18 mm facet screws were placed in C7 and 14 mm facet screws were placed in C4-C5 and C6 bilaterally.  Once all the screws were placed then precontoured rod was measured and cut to the appropriate length and then contoured further to fit in the sagittals of the screws from C4-T1.  These were then tightened in a neutral construct.  Lateral gutters had been decorticated.  And the bone graft from the laminectomy had been prepared and chopped finally and then placed in the lateral gutters from C6-C7.  At this it was noted that the dura was well decompressed centrally and laterally no epidural hematoma remained and with this the cervical dorsal fascia was closed over a medium Hemovac drain brought out through separate stab incision the subcutaneous tissue was closed with 2-0 Vicryl and 3-0 Vicryl was used in the subarticular skin Dermabond was placed in the skin blood loss for the procedure was estimated about 350 cc.  Patient was returned 3 recovery room in stable condition

## 2018-01-22 NOTE — Consult Note (Signed)
Reason for Consult: Paraplegia, new onset this morning with known C7 fracture Referring Physician: Dr. Edwena Bunde [name] is an 78 y.o. female.  HPI: [name] is a 77 year old individual well known to me.  In September we did a decompression at C7-T1 secondary to advanced spondylosis.  She was home and apparently had locked herself out of the house and was trying to manipulate the door so she can get back into her home when she had a fall backwards rolling onto her neck.  She had significant neck pain and EMS was ultimately summoned.  She was brought to the [facility] emergency department where CT scan demonstrates presence of a transverse process fracture through C7 that goes into the vertebral body and transversely through the entirety of the C7 was minimally displaced with approximately a millimeter of this location anteriorly.  The spinal canal however appeared to be amply patent and secure.  At that time she was apparently moving her upper and lower extremities fairly well.  She was evaluated radiographically by Dr. Trenton Gammon and he suggested placing in a hard cervical collar.  She is admitted to the hospital and today she was moved from the bed to a chair and after sitting for brief period of time she apparently noted that she could not move her legs.  She was placed back into the bed and I was contacted.  I advised that we do an MRI at the earliest convenience.  This is still waiting to be completed.  Since being back in the bed she has resumed some mobility in her legs but clearly not to the extent that she was able to move yesterday.  She is able to wiggle her toes and on texting proximally she can extend both her quads to a level of 4 out of 5.  She has weakness though in the intrinsics of her hand grip strength is significantly decreased in the upper extremities.  As noted an MRI of her neck is still pending.  Past Medical History:  Diagnosis Date  . Anemia    during 1st pregnancy  . Anxiety   .  Arthritis   . Complication of anesthesia   . Depression   . Diarrhea, functional   . Dysrhythmia    Paroysmal Atrial Tachycardia  . Essential hypertension   . Fibromyalgia   . GERD (gastroesophageal reflux disease)   . H/O acute pancreatitis   . Headache   . History of hiatal hernia   . Hyperlipemia   . Osteoporosis   . PAT (paroxysmal atrial tachycardia) ([place])   . Pneumonia   . PONV (postoperative nausea and vomiting)   . PSVT (paroxysmal supraventricular tachycardia) ([place])   . Reflux   . Restless leg syndrome   . Type 2 diabetes mellitus (HCC)    Type II  . Vertigo     Past Surgical History:  Procedure Laterality Date  . ABDOMINAL HYSTERECTOMY    . ANKLE SURGERY    . ANTERIOR CERVICAL DECOMP/DISCECTOMY FUSION N/A 11/12/2017   Procedure: Cervical seven to Thoracic one  Anterior cervical decompression/discectomy/fusion with removal of old plate;  Surgeon: [name], MD;  Location: [facility];  Service: Neurosurgery;  Laterality: N/A;  . BACK SURGERY     T12-L1 fusion, Anterior Cervical fusion  . CARDIAC CATHETERIZATION N/A 02/16/2015   Procedure: Left Heart Cath and Coronary Angiography;  Surgeon: Sherren Mocha, MD;  Location: [facility];  Service: Cardiovascular;  Laterality: N/A;  . CHOLECYSTECTOMY    .  COLONOSCOPY    . ESOPHAGOGASTRODUODENOSCOPY  12/21/2010   Procedure: ESOPHAGOGASTRODUODENOSCOPY (EGD);  Surgeon: Landry Dyke, MD;  Location: [facility];  Service: Endoscopy;  Laterality: N/A;  . EXCISION MORTON'S NEUROMA    . EYE SURGERY Bilateral    Cataract  . FRACTURE SURGERY     ankle x 2   . KNEE SURGERY     x 3  . NECK SURGERY    . ROTATOR CUFF REPAIR    . TONSILLECTOMY    . TOTAL KNEE ARTHROPLASTY Left 04/10/2017   Procedure: LEFT TOTAL KNEE ARTHROPLASTY;  Surgeon: Paralee Cancel, MD;  Location: [facility];  Service: Orthopedics;  Laterality: Left;  70 mins  . UPPER GASTROINTESTINAL ENDOSCOPY    . WRIST SURGERY      Family History  Problem Relation  Age of Onset  . Heart failure Mother   . Hyperlipidemia Mother   . Lung cancer Father        Lymphoma  . Hyperlipidemia Maternal Grandmother   . Peripheral vascular disease Maternal Grandmother     Social History:  reports that she has quit smoking. She has never used smokeless tobacco. She reports that she does not drink alcohol or use drugs.  Allergies:  Allergies  Allergen Reactions  . Amoxil [Amoxicillin] Swelling and Other (See Comments)    Lips Has patient had a PCN reaction causing immediate rash, facial/tongue/throat swelling, SOB or lightheadedness with hypotension:YES Has patient had a PCN reaction causing severe rash involving mucus membranes or skin necrosis:No Has patient had a PCN reaction that required hospitalization:No Has patient had a PCN reaction occurring within the last 10 years:Yes. If all of the above answers are "NO", then may proceed with Cephalosporin use.   . Chocolate Hives  . Demerol Nausea And Vomiting  . Lisinopril Itching and Swelling  . Meperidine     Other reaction(s): Vomiting (intolerance)  . Meperidine Hcl     Other reaction(s): Vomiting (intolerance)  . Other     Raw food or nuts - GI pain    Medications: I have reviewed the patient's current medications.  Results for orders placed or performed during the hospital encounter of 01/21/18 (from the past 48 hour(s))  Basic metabolic panel     Status: Abnormal   Collection Time: 01/21/18  4:12 PM  Result Value Ref Range   Sodium 136 135 - 145 mmol/L   Potassium 4.1 3.5 - 5.1 mmol/L   Chloride 102 98 - 111 mmol/L   CO2 21 (L) 22 - 32 mmol/L   Glucose, Bld 204 (H) 70 - 99 mg/dL   BUN 22 8 - 23 mg/dL   Creatinine, Ser 0.84 0.44 - 1.00 mg/dL   Calcium 9.6 8.9 - 10.3 mg/dL   GFR calc non Af Amer >60 >60 mL/min   GFR calc Af Amer >60 >60 mL/min   Anion gap 13 5 - 15    Comment: Performed at [facility], [place] [address], [place], [place] [address]  CBC with Differential     Status:  Abnormal   Collection Time: 01/21/18  4:12 PM  Result Value Ref Range   WBC 19.0 (H) 4.0 - 10.5 K/uL   RBC 4.88 3.87 - 5.11 MIL/uL   Hemoglobin 13.8 12.0 - 15.0 g/dL   HCT 42.6 36.0 - 46.0 %   MCV 87.3 80.0 - 100.0 fL   MCH 28.3 26.0 - 34.0 pg   MCHC 32.4 30.0 - 36.0 g/dL   RDW 13.1 11.5 -  15.5 %   Platelets 175 150 - 400 K/uL   nRBC 0.0 0.0 - 0.2 %   Neutrophils Relative % 88 %   Neutro Abs 16.8 (H) 1.7 - 7.7 K/uL   Lymphocytes Relative 6 %   Lymphs Abs 1.1 0.7 - 4.0 K/uL   Monocytes Relative 5 %   Monocytes Absolute 0.9 0.1 - 1.0 K/uL   Eosinophils Relative 0 %   Eosinophils Absolute 0.0 0.0 - 0.5 K/uL   Basophils Relative 0 %   Basophils Absolute 0.1 0.0 - 0.1 K/uL   Immature Granulocytes 1 %   Abs Immature Granulocytes 0.12 (H) 0.00 - 0.07 K/uL    Comment: Performed at [facility] [address], [place], [place] [address]  Protime-INR     Status: None   Collection Time: 01/21/18  4:12 PM  Result Value Ref Range   Prothrombin Time 12.5 11.4 - 15.2 seconds   INR 0.94     Comment: Performed at [facility] [address], [place], [place] [address]  Urinalysis, Routine w reflex microscopic     Status: Abnormal   Collection Time: 01/21/18  5:42 PM  Result Value Ref Range   Color, Urine YELLOW YELLOW   APPearance CLEAR CLEAR   Specific Gravity, Urine 1.015 1.005 - 1.030   pH 5.0 5.0 - 8.0   Glucose, UA 50 (A) NEGATIVE mg/dL   Hgb urine dipstick MODERATE (A) NEGATIVE   Bilirubin Urine NEGATIVE NEGATIVE   Ketones, ur NEGATIVE NEGATIVE mg/dL   Protein, ur 100 (A) NEGATIVE mg/dL   Nitrite POSITIVE (A) NEGATIVE   Leukocytes, UA NEGATIVE NEGATIVE   RBC / HPF 0-5 0 - 5 RBC/hpf   WBC, UA 0-5 0 - 5 WBC/hpf   Bacteria, UA RARE (A) NONE SEEN   Mucus PRESENT     Comment: Performed at [facility], [address] [place], [place] [address]  Basic metabolic panel     Status: Abnormal   Collection Time: 01/22/18 12:42 AM  Result Value Ref Range   Sodium  133 (L) 135 - 145 mmol/L   Potassium 4.4 3.5 - 5.1 mmol/L   Chloride 98 98 - 111 mmol/L   CO2 22 22 - 32 mmol/L   Glucose, Bld 189 (H) 70 - 99 mg/dL   BUN 19 8 - 23 mg/dL   Creatinine, Ser 0.84 0.44 - 1.00 mg/dL   Calcium 9.1 8.9 - 10.3 mg/dL   GFR calc non Af Amer >60 >60 mL/min   GFR calc Af Amer >60 >60 mL/min   Anion gap 13 5 - 15    Comment: Performed at [facility] [address] [place] [address]  CBC     Status: None   Collection Time: 01/22/18 12:42 AM  Result Value Ref Range   WBC 10.2 4.0 - 10.5 K/uL   RBC 4.53 3.87 - 5.11 MIL/uL   Hemoglobin 12.8 12.0 - 15.0 g/dL   HCT 38.8 36.0 - 46.0 %   MCV 85.7 80.0 - 100.0 fL   MCH 28.3 26.0 - 34.0 pg   MCHC 33.0 30.0 - 36.0 g/dL   RDW 13.2 11.5 - 15.5 %   Platelets 181 150 - 400 K/uL   nRBC 0.0 0.0 - 0.2 %    Comment: Performed at [facility], [place] [address], [place], [place] [address]  Magnesium     Status: Abnormal   Collection Time: 01/22/18 12:42 AM  Result  Value Ref Range   Magnesium 1.6 (L) 1.7 - 2.4 mg/dL    Comment: Performed at [facility] [address], [place], [place] [address]  Troponin I - ONCE - STAT     Status: Abnormal   Collection Time: 01/22/18 12:42 AM  Result Value Ref Range   Troponin I 0.59 (HH) <0.03 ng/mL    Comment: CRITICAL RESULT CALLED TO, READ BACK BY AND VERIFIED WITH: BATOON I,RN 01/22/18 0202 WAYK Performed at [facility], [place] [address] [place], [place] [address]   Digoxin level     Status: Abnormal   Collection Time: 01/22/18 12:42 AM  Result Value Ref Range   Digoxin Level 0.5 (L) 0.8 - 2.0 ng/mL    Comment: Performed at [facility], [place] [address] [place], [place] [address]  CK     Status: Abnormal   Collection Time: 01/22/18 12:42 AM  Result Value Ref Range   Total CK 1,873 (H) 38 - 234 U/L    Comment: Performed at [facility], [place] [address], [place], [place] [address]  Glucose, capillary     Status: Abnormal   Collection  Time: 01/22/18  1:11 AM  Result Value Ref Range   Glucose-Capillary 177 (H) 70 - 99 mg/dL  Troponin I - Now Then Q6H     Status: Abnormal   Collection Time: 01/22/18  6:45 AM  Result Value Ref Range   Troponin I 0.54 (HH) <0.03 ng/mL    Comment: CRITICAL VALUE NOTED.  VALUE IS CONSISTENT WITH PREVIOUSLY REPORTED AND CALLED VALUE. Performed at [facility], [place] [address], [place], [place] [address]   CK     Status: Abnormal   Collection Time: 01/22/18  7:11 AM  Result Value Ref Range   Total CK 1,525 (H) 38 - 234 U/L    Comment: Performed at [facility], [place] [address], [place], [place] [address]  Glucose, capillary     Status: Abnormal   Collection Time: 01/22/18  8:24 AM  Result Value Ref Range   Glucose-Capillary 174 (H) 70 - 99 mg/dL  Troponin I - Now Then Q6H     Status: Abnormal   Collection Time: 01/22/18 11:14 AM  Result Value Ref Range   Troponin I 0.36 (HH) <0.03 ng/mL    Comment: CRITICAL VALUE NOTED.  VALUE IS CONSISTENT WITH PREVIOUSLY REPORTED AND CALLED VALUE. Performed at [facility], [place] [address] [place], [place] [address]   Glucose, capillary     Status: Abnormal   Collection Time: 01/22/18 11:36 AM  Result Value Ref Range   Glucose-Capillary 183 (H) 70 - 99 mg/dL    Dg Shoulder Right  Result Date: 01/21/2018 CLINICAL DATA:  Fall EXAM: RIGHT SHOULDER - 2+ VIEW COMPARISON:  07/19/2011 FINDINGS: Negative for fracture or dislocation. Mild degenerative change in the [facility] joint and shoulder joint. Mild associated spurring. IMPRESSION: Negative for fracture or dislocation. Electronically Signed   By: Franchot Gallo M.D.   On: 01/21/2018 14:59   Ct Head Wo Contrast  Result Date: 01/21/2018 CLINICAL DATA:  Patient status post C7-T1 fusion 10/24/2017. History of prior C4-6 fusion. Status post fall off a porch today. Limited range of motion of the head and new tingling in the left fingers since the fall. Initial encounter. EXAM: CT HEAD WITHOUT  CONTRAST CT CERVICAL SPINE WITHOUT CONTRAST TECHNIQUE: Multidetector CT imaging of the head and cervical spine was performed following the standard protocol without  intravenous contrast. Multiplanar CT image reconstructions of the cervical spine were also generated. COMPARISON:  Brain MRI 01/12/2018. Cervical spine CT scan 12/03/2017. Head CT 09/21/2017. FINDINGS: CT HEAD FINDINGS Brain: No evidence of acute infarction, hemorrhage, hydrocephalus, extra-axial collection or mass lesion/mass effect. Vascular: No hyperdense vessel or unexpected calcification. Skull: Intact.  No focal lesion. Sinuses/Orbits: Small mucous retention cyst or polyp right maxillary sinus is unchanged. The patient is status post cataract surgery bilaterally. Other: None. CT CERVICAL SPINE FINDINGS Alignment: Approximately 0.4 cm anterolisthesis C7 on T1 is unchanged since the prior CT. Alignment is otherwise maintained. Skull base and vertebrae: The patient has an acute fracture of C7. The fracture extends from the left side of the vertebral body into the left C7 facet and base of the left transverse process. The fracture is minimally displaced at 1-2 mm. No other fracture is identified. The interbody spacer and C7 appears to be incorporating into the endplates. Soft tissues and spinal canal: No epidural hematoma is identified. Disc levels: Loss of disc space height and endplate spurring at F0-2 appear unchanged. Upper chest: Lung apices are clear. Other: None. IMPRESSION: Acute fracture of the left side of the C7 vertebral body extends into the left facet and base of the left transverse process. No other acute abnormality is identified. 0.4 cm anterolisthesis C7 on T1 is unchanged. Status post C4-6 and C7-T1 fusion. No acute intracranial normality. Electronically Signed   By: Inge Rise M.D.   On: 01/21/2018 15:32   Ct Cervical Spine Wo Contrast  Result Date: 01/21/2018 CLINICAL DATA:  Patient status post C7-T1 fusion 10/24/2017.  History of prior C4-6 fusion. Status post fall off a porch today. Limited range of motion of the head and new tingling in the left fingers since the fall. Initial encounter. EXAM: CT HEAD WITHOUT CONTRAST CT CERVICAL SPINE WITHOUT CONTRAST TECHNIQUE: Multidetector CT imaging of the head and cervical spine was performed following the standard protocol without intravenous contrast. Multiplanar CT image reconstructions of the cervical spine were also generated. COMPARISON:  Brain MRI 01/12/2018. Cervical spine CT scan 12/03/2017. Head CT 09/21/2017. FINDINGS: CT HEAD FINDINGS Brain: No evidence of acute infarction, hemorrhage, hydrocephalus, extra-axial collection or mass lesion/mass effect. Vascular: No hyperdense vessel or unexpected calcification. Skull: Intact.  No focal lesion. Sinuses/Orbits: Small mucous retention cyst or polyp right maxillary sinus is unchanged. The patient is status post cataract surgery bilaterally. Other: None. CT CERVICAL SPINE FINDINGS Alignment: Approximately 0.4 cm anterolisthesis C7 on T1 is unchanged since the prior CT. Alignment is otherwise maintained. Skull base and vertebrae: The patient has an acute fracture of C7. The fracture extends from the left side of the vertebral body into the left C7 facet and base of the left transverse process. The fracture is minimally displaced at 1-2 mm. No other fracture is identified. The interbody spacer and C7 appears to be incorporating into the endplates. Soft tissues and spinal canal: No epidural hematoma is identified. Disc levels: Loss of disc space height and endplate spurring at O3-7 appear unchanged. Upper chest: Lung apices are clear. Other: None. IMPRESSION: Acute fracture of the left side of the C7 vertebral body extends into the left facet and base of the left transverse process. No other acute abnormality is identified. 0.4 cm anterolisthesis C7 on T1 is unchanged. Status post C4-6 and C7-T1 fusion. No acute intracranial normality.  Electronically Signed   By: Inge Rise M.D.   On: 01/21/2018 15:32   Dg Shoulder Left  Result Date: 01/21/2018 CLINICAL  DATA:  Fell down stairs EXAM: LEFT SHOULDER - 2+ VIEW COMPARISON:  None. FINDINGS: There is no evidence of fracture or dislocation. There is no evidence of arthropathy or other focal bone abnormality. Soft tissues are unremarkable. IMPRESSION: Negative. Electronically Signed   By: Franchot Gallo M.D.   On: 01/21/2018 15:00    Review of Systems  Constitutional: Negative.   HENT: Negative.   Cardiovascular: Negative.   Gastrointestinal: Negative.   Genitourinary: Negative.   Musculoskeletal: Positive for neck pain.  Skin: Negative.   Neurological:       Weakness in the distal upper extremities and lower extremities  Endo/Heme/Allergies: Negative.   Psychiatric/Behavioral: Negative.    Blood pressure (!) 161/76, pulse 79, temperature 98.9 F (37.2 C), resp. rate 20, height 5\' 4"  (1.626 m), SpO2 97 %. Physical Exam  Constitutional: She appears well-developed and well-nourished.  Eyes: Pupils are equal, round, and reactive to light. Conjunctivae and EOM are normal.  Neck:  Hard cervical collar.  Neurological:  Weak in the distal upper extremities with grip strength that is decreased to 3 out of 5 intrinsic strength that is 2 out of 5 wrist extensor strength that is 2 out of 5  In the lower extremities she has ability to extend the quadricep on either side with 4- out of 5 strength she is not using dorsi or plantar flexors very well though she can wiggle the toes in either foot such that the extensor houses longus appears to be working.  He cannot lift either leg off the bed on her own at this time suggesting proximal leg weakness in the iliopsoas bilaterally. There is a significant change from what was noted on her initial exam where she could stand and pivot to get from bed to chair.  This change occurred this morning after sitting upright in the chair for a period  of an hour or so patient notes that she could not move her legs  Skin: Skin is warm and dry.  Psychiatric: She has a normal mood and affect. Her behavior is normal. Judgment and thought content normal.    Assessment/Plan: Neurologic change this morning after being set up into a chair.  Patient is awaiting an MRI of the cervical spine.  This should be forthcoming shortly.  She was given some Decadron.  Her neurologic exam appears to have improved somewhat from the acute paraplegia she suffered.  Await to see the results of the MRI.  [name] 01/22/2018, 1:08 PM

## 2018-01-22 NOTE — Progress Notes (Signed)
Per MD verbal order, patient up to chair. Able to stand and walk with 1-2 person assist.

## 2018-01-22 NOTE — Progress Notes (Addendum)
PROGRESS NOTE                                                                                                                                                                                                             Patient Demographics:    [name], is a 77 y.o. female, DOB - [birth year], KGM:[id number]  Admit date - 01/21/2018   Admitting Physician [name], MD  Outpatient Primary MD for the patient is [name], Malka So, MD  LOS - 0  Chief Complaint  Patient presents with  . Fall       Brief Narrative  [name] is a 77 y.o. female with history of SVT, hypertension, fibromyalgia, diabetes mellitus had a fall at home while she was trying to open her door.  She struck her head when she fell from a porch.  Denied losing consciousness, nochest pain or palpitations.  He came to the ER where a C-spine CT scan showed an acute C7 fracture, Dr. [name] neurosurgeon on-call was consulted by the ER physician who advised a c-collar which was placed, she was subsequently admitted to the hospital.  Early this morning patient requested that she be placed in the chair as she was not comfortable in the bed, she was placed in the chair with assistance, she did fine however after about 45 minutes of sitting up in the chair she reported that she is unable to move her legs.  Neurosurgery was reconsulted, she was given IV Decadron 20 mg and MRI C-spine has been ordered.   Subjective:    Cephas Darby today has, No headache, No chest pain, No abdominal pain - No Nausea, No new weakness tingling or numbness, No Cough - SOB. MILD NECK PAIN BUT LEGS GOT WEAK TODAY.   Assessment  & Plan :     1.  Mechanical fall, acute C7 fracture.  Now sudden bilateral lower extremity weakness despite wearing c-collar.  Suspicious for cord injury, have given 20 mg of IV Decadron - ? bleed or cord edema, discussed the case with neurosurgeon Dr. [name], he wants a stat C-spine MRI which has been  ordered will also include upper T-spine.  Dr. Jessy Oto will see the patient soon.  2.  Multiple C-spine surgeries last CT 7 to T1 fusion in September of this year, multiple falls at home.  Had detailed discussion with patient's daughter bedside, patient has had several falls over the last several months and she had been recommended going to rehab  multiple times but patient had been refusing it and been noncompliant with recommendations.  Strictly reinforced that once she gets better she should consider SNF.  Will commence PT OT once she is stable.  3.  Chronic bilateral upper extremity right more than left weakness.  PT OT.  4.  History of SVT.  On metoprolol, verapamil and digoxin combination which will be continued.  5.  HX of restless leg syndrome along with peripheral neuropathy.  On Requip continue.  6.  Essential hypertension.  On combination of metoprolol and ACE inhibitor along with verapamil.  Continue and monitor.  7.  Chronic diastolic CHF EF 37% on echocardiogram done in November 2019.  Mildly elevated troponin and nonspecific non-ACS pattern.  No chest pain, no shortness of breath, EKG nonacute.  Continue combination of aspirin and beta-blocker for secondary prevention and monitor.  No acute issues.   8.  GERD.  On PPI.   9.  UTI.  Placed on Keflex will monitor.   10.  DM type II.  On Lantus twice daily along with sliding scale will monitor.  CBGs might run higher today as she received some Decadron this morning.  CBG (last 3)  Recent Labs    01/22/18 0111 01/22/18 0824  GLUCAP 177* 174*      Family Communication  :  Daughter bedside  Code Status :  Full  Disposition Plan  :  TBD  Consults  :  N.Surg  Procedures  :    CT C spine - Acute fracture of the left side of the C7 vertebral body extends into the left facet and base of the left transverse process. No other acute abnormality is identified. 0.4 cm anterolisthesis C7 on T1 is unchanged. Status post C4-6 and  C7-T1 fusion.  MRI C Spine  TTE Nov 2019 -   Left ventricle: The cavity size was normal. Systolic function was  normal. The estimated ejection fraction was in the range of 60% to 65%. Wall motion was normal; there were no regional wall motion abnormalities. Left ventricular diastolic function parameters were normal for the patient&'s age. - Aortic valve: Trileaflet; mildly thickened, mildly calcified leaflets. Left coronary cusp predominanly affected by calcification. Sclerosis without stenosis. There was no   regurgitation. - Mitral valve: There was no significant regurgitation. - Right ventricle: Systolic function was normal. - Atrial septum: No defect or patent foramen ovale was identified. - Tricuspid valve: There was no significant regurgitation. - Pulmonic valve: There was no significant regurgitation.   DVT Prophylaxis  : SCDs   Lab Results  Component Value Date   PLT 181 01/22/2018    Diet :  Diet Order            Diet Heart Room service appropriate? Yes; Fluid consistency: Thin  Diet effective now               Inpatient Medications Scheduled Meds: . aspirin EC  81 mg Oral QPM  . cephALEXin  500 mg Oral Q12H  . digoxin  0.125 mg Oral Daily  . insulin aspart  0-9 Units Subcutaneous TID WC  . insulin glargine  65 Units Subcutaneous QHS  . metoprolol succinate  25 mg Oral Daily  . pantoprazole  40 mg Oral Daily  . ramipril  10 mg Oral QPM  . rOPINIRole  1 mg Oral QHS  . verapamil  240 mg Oral Daily   Continuous Infusions: . sodium chloride 50 mL/hr at 01/22/18 1031   PRN Meds:.acetaminophen **OR** acetaminophen,  diphenhydrAMINE, HYDROmorphone (DILAUDID) injection, LORazepam, nitroGLYCERIN, ondansetron **OR** ondansetron (ZOFRAN) IV  Antibiotics  :   Anti-infectives (From admission, onward)   Start     Dose/Rate Route Frequency Ordered Stop   01/22/18 1800  cephALEXin (KEFLEX) capsule 500 mg     500 mg Oral Every 12 hours 01/22/18 0945     01/22/18 0600   ciprofloxacin (CIPRO) IVPB 400 mg  Status:  Discontinued     400 mg 200 mL/hr over 60 Minutes Intravenous Every 12 hours 01/21/18 2237 01/22/18 0906   01/21/18 1845  ciprofloxacin (CIPRO) IVPB 400 mg     400 mg 200 mL/hr over 60 Minutes Intravenous  Once 01/21/18 1832 01/21/18 2033          Objective:   Vitals:   01/21/18 1843 01/21/18 2358 01/22/18 0521 01/22/18 0527  BP: (!) 168/80 (!) 158/89 (!) 161/76   Pulse: 77 81 79   Resp: 16 16 20    Temp:  98 F (36.7 C) 98.9 F (37.2 C)   TempSrc:  Oral    SpO2: 96% 97% 97%   Height:    5\' 4"  (1.626 m)    Wt Readings from Last 3 Encounters:  12/28/17 94.3 kg  12/21/17 94.3 kg  11/12/17 90.7 kg     Intake/Output Summary (Last 24 hours) at 01/22/2018 1039 Last data filed at 01/22/2018 0300 Gross per 24 hour  Intake 342.54 ml  Output 650 ml  Net -307.46 ml     Physical Exam  Awake Alert, Oriented X 3, chronic R arm weaker than Left, Left 2nd Trigger finger, Lower leg strength 0/5  [place].AT,PERRAL Supple Neck,No JVD, No cervical lymphadenopathy appriciated.  Symmetrical Chest wall movement, Good air movement bilaterally, CTAB RRR,No Gallops,Rubs or new Murmurs, No Parasternal Heave +ve B.Sounds, Abd Soft, No tenderness, No organomegaly appriciated, No rebound - guarding or rigidity. No Cyanosis, Clubbing or edema, No new Rash or bruise      Data Review:    CBC Recent Labs  Lab 01/21/18 1612 01/22/18 0042  WBC 19.0* 10.2  HGB 13.8 12.8  HCT 42.6 38.8  PLT 175 181  MCV 87.3 85.7  MCH 28.3 28.3  MCHC 32.4 33.0  RDW 13.1 13.2  LYMPHSABS 1.1  --   MONOABS 0.9  --   EOSABS 0.0  --   BASOSABS 0.1  --     Chemistries  Recent Labs  Lab 01/21/18 1612 01/22/18 0042  NA 136 133*  K 4.1 4.4  CL 102 98  CO2 21* 22  GLUCOSE 204* 189*  BUN 22 19  CREATININE 0.84 0.84  CALCIUM 9.6 9.1  MG  --  1.6*    ------------------------------------------------------------------------------------------------------------------ No results for input(s): CHOL, HDL, LDLCALC, TRIG, CHOLHDL, LDLDIRECT in the last 72 hours.  Lab Results  Component Value Date   HGBA1C 7.6 (H) 11/12/2017   ------------------------------------------------------------------------------------------------------------------ No results for input(s): TSH, T4TOTAL, T3FREE, THYROIDAB in the last 72 hours.  Invalid input(s): FREET3 ------------------------------------------------------------------------------------------------------------------ No results for input(s): VITAMINB12, FOLATE, FERRITIN, TIBC, IRON, RETICCTPCT in the last 72 hours.  Coagulation profile Recent Labs  Lab 01/21/18 1612  INR 0.94    No results for input(s): DDIMER in the last 72 hours.  Cardiac Enzymes Recent Labs  Lab 01/22/18 0042 01/22/18 0645  TROPONINI 0.59* 0.54*   ------------------------------------------------------------------------------------------------------------------    Component Value Date/Time   BNP 10.6 02/14/2015 0341    Micro Results No results found for this or any previous visit (from the past 240 hour(s)).  Radiology Reports  Dg Shoulder Right  Result Date: 01/21/2018 CLINICAL DATA:  Fall EXAM: RIGHT SHOULDER - 2+ VIEW COMPARISON:  07/19/2011 FINDINGS: Negative for fracture or dislocation. Mild degenerative change in the [facility] joint and shoulder joint. Mild associated spurring. IMPRESSION: Negative for fracture or dislocation. Electronically Signed   By: Franchot Gallo M.D.   On: 01/21/2018 14:59   Ct Head Wo Contrast  Result Date: 01/21/2018 CLINICAL DATA:  Patient status post C7-T1 fusion 10/24/2017. History of prior C4-6 fusion. Status post fall off a porch today. Limited range of motion of the head and new tingling in the left fingers since the fall. Initial encounter. EXAM: CT HEAD WITHOUT CONTRAST CT CERVICAL  SPINE WITHOUT CONTRAST TECHNIQUE: Multidetector CT imaging of the head and cervical spine was performed following the standard protocol without intravenous contrast. Multiplanar CT image reconstructions of the cervical spine were also generated. COMPARISON:  Brain MRI 01/12/2018. Cervical spine CT scan 12/03/2017. Head CT 09/21/2017. FINDINGS: CT HEAD FINDINGS Brain: No evidence of acute infarction, hemorrhage, hydrocephalus, extra-axial collection or mass lesion/mass effect. Vascular: No hyperdense vessel or unexpected calcification. Skull: Intact.  No focal lesion. Sinuses/Orbits: Small mucous retention cyst or polyp right maxillary sinus is unchanged. The patient is status post cataract surgery bilaterally. Other: None. CT CERVICAL SPINE FINDINGS Alignment: Approximately 0.4 cm anterolisthesis C7 on T1 is unchanged since the prior CT. Alignment is otherwise maintained. Skull base and vertebrae: The patient has an acute fracture of C7. The fracture extends from the left side of the vertebral body into the left C7 facet and base of the left transverse process. The fracture is minimally displaced at 1-2 mm. No other fracture is identified. The interbody spacer and C7 appears to be incorporating into the endplates. Soft tissues and spinal canal: No epidural hematoma is identified. Disc levels: Loss of disc space height and endplate spurring at O8-7 appear unchanged. Upper chest: Lung apices are clear. Other: None. IMPRESSION: Acute fracture of the left side of the C7 vertebral body extends into the left facet and base of the left transverse process. No other acute abnormality is identified. 0.4 cm anterolisthesis C7 on T1 is unchanged. Status post C4-6 and C7-T1 fusion. No acute intracranial normality. Electronically Signed   By: Inge Rise M.D.   On: 01/21/2018 15:32   Ct Cervical Spine Wo Contrast  Result Date: 01/21/2018 CLINICAL DATA:  Patient status post C7-T1 fusion 10/24/2017. History of prior C4-6  fusion. Status post fall off a porch today. Limited range of motion of the head and new tingling in the left fingers since the fall. Initial encounter. EXAM: CT HEAD WITHOUT CONTRAST CT CERVICAL SPINE WITHOUT CONTRAST TECHNIQUE: Multidetector CT imaging of the head and cervical spine was performed following the standard protocol without intravenous contrast. Multiplanar CT image reconstructions of the cervical spine were also generated. COMPARISON:  Brain MRI 01/12/2018. Cervical spine CT scan 12/03/2017. Head CT 09/21/2017. FINDINGS: CT HEAD FINDINGS Brain: No evidence of acute infarction, hemorrhage, hydrocephalus, extra-axial collection or mass lesion/mass effect. Vascular: No hyperdense vessel or unexpected calcification. Skull: Intact.  No focal lesion. Sinuses/Orbits: Small mucous retention cyst or polyp right maxillary sinus is unchanged. The patient is status post cataract surgery bilaterally. Other: None. CT CERVICAL SPINE FINDINGS Alignment: Approximately 0.4 cm anterolisthesis C7 on T1 is unchanged since the prior CT. Alignment is otherwise maintained. Skull base and vertebrae: The patient has an acute fracture of C7. The fracture extends from the left side of the vertebral body into the left C7 facet  and base of the left transverse process. The fracture is minimally displaced at 1-2 mm. No other fracture is identified. The interbody spacer and C7 appears to be incorporating into the endplates. Soft tissues and spinal canal: No epidural hematoma is identified. Disc levels: Loss of disc space height and endplate spurring at W6-5 appear unchanged. Upper chest: Lung apices are clear. Other: None. IMPRESSION: Acute fracture of the left side of the C7 vertebral body extends into the left facet and base of the left transverse process. No other acute abnormality is identified. 0.4 cm anterolisthesis C7 on T1 is unchanged. Status post C4-6 and C7-T1 fusion. No acute intracranial normality. Electronically Signed    By: Inge Rise M.D.   On: 01/21/2018 15:32   Mr Jodene Nam Head Wo Contrast  Result Date: 01/13/2018  [facility] [address], [place], [place] [address] [phone] NEUROIMAGING REPORT STUDY DATE: 01/12/2018 PATIENT NAME: SHAQUALA BROEKER DOB: Dec 18, 1940 MRN: [id number] EXAM: MR angiogram of the intracranial arteries ORDERING CLINICIAN: Andrey Spearman MD CLINICAL HISTORY: 77 year old woman with vertigo and vertebrobasilar TIAs COMPARISON FILMS: None TECHNIQUE: MR angiogram of the head was obtained utilizing 3D time of flight sequences from below the vertebrobasilar junction up to the intracranial vasculature without contrast.  Computerized reconstructions were obtained. CONTRAST: None IMAGING SITE: [facility], [place], [place], [place] FINDINGS: The imaged extracranial and intracranial portions of the internal carotid arteries appear normal. The middle cerebral appear normal.  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   In the posterior circulation, the left vertebral artery is mildly dominant.  No stenosis is noted within the vertebral arteries and the basilar arteries.  Posterior communicating arteries are noted bilaterally, right greater than left.  The right posterior artery derives most of its flow from the anterior circulation through the right posterior communicating artery.  The posterior cerebral arteries do not appear to have stenosis.. No aneurysms were identified.   This MR angiogram of the intracranial arteries shows the following: 1.    Normal variant anatomy with an absent A1 segment of the right anterior cerebral artery.  There is a fetal origin of the right posterior cerebral artery. 2.    No significant arterial stenosis.  No aneurysms.  INTERPRETING PHYSICIAN: Richard A. Felecia Shelling, MD, PhD, FAAN Certified in  Neuroimaging by  [employer] [employer]   Mr Jodene Nam Neck W Wo Contrast  Result Date: 01/13/2018  [facility] [address], [place], [place] [address] [phone] NEUROIMAGING REPORT STUDY DATE: 01/12/2018 PATIENT NAME: ALBERTINA LEISE DOB: 23-Mar-1940 MRN: [id number] EXAM: MR Angiogram of the Neck Arteries with and without contrast ORDERING CLINICIAN: Andrey Spearman MD CLINICAL HISTORY: 77 year old woman with vertebrobasilar TIAs COMPARISON FILMS: None TECHNIQUE: MR angiogram of the neck was obtained utilizing 3D time-of-flight sequences from the aortic arch up to the intracranial vasculature postbolus contrast infusion.  2D time-of-flight noncontrast views and computerized reconstructions were obtained. CONTRAST: 19 mL MultiHance IMAGING SITE: [facility], [place], [place], [place] FINDINGS: This study is of adequate technical quality.  There is anterograde flow in the bilateral vertebral and carotid arteries on 2D-TOF views.  The flow signal of the left subclavian artery has no stenosis.  The left common and external carotid arteries have no stenosis.  There is minimal (10 to 15%) stenosis at the origin of the left internal carotid artery.  This  is hemodynamically significant.  The left vertebral artery is mildly smaller than the right.  The origin appears normal.  There is reduced flow signal in the proximal cervical segment that is most likely artifactual.  The more distal left vertebral artery has no stenosis up to the vertebrobasilar junction.  On the right, the brachiocephalic trunk and subclavian arteries have no stenosis. The right common, internal and external carotid arteries have no stenosis. The right vertebral artery has no stenosis from its origin to the vertebrobasilar junction. Limited views of the intracranial vasculature are unremarkable.   This MR angiogram of the neck arteries shows the following: 1.    Although there is reduced flow signal in the proximal  cervical segment of the left vertebral artery on the source and reconstructed images, this is most likely artifactual as there is completely normal flow more distally. 2.    Minimal (less than 15%) stenosis at the origin of the left internal carotid artery.  This is not hemodynamically significant.  3.    Other arteries appear normal. INTERPRETING PHYSICIAN: Richard A. Felecia Shelling, MD, PhD, FAAN Certified in  Neuroimaging by [employer] [employer]   Dg Shoulder Left  Result Date: 01/21/2018 CLINICAL DATA:  [address] down stairs EXAM: LEFT SHOULDER - 2+ VIEW COMPARISON:  None. FINDINGS: There is no evidence of fracture or dislocation. There is no evidence of arthropathy or other focal bone abnormality. Soft tissues are unremarkable. IMPRESSION: Negative. Electronically Signed   By: Franchot Gallo M.D.   On: 01/21/2018 15:00   Mr Brain/iac W Wo Contrast  Result Date: 01/13/2018  [facility] [address] [place], [place] [address] [phone] NEUROIMAGING REPORT STUDY DATE: 01/12/2018 PATIENT NAME: BAILEIGH MODISETTE DOB: 1940-11-26 MRN: [id number] EXAM: MRI Brain with and without contrast ORDERING CLINICIAN: Andrey Spearman MD CLINICAL HISTORY: 77 year old woman with vertigo COMPARISON FILMS: None TECHNIQUE:MRI of the brain with and without contrast was obtained utilizing 5 mm axial slices with T1, T2, T2 flair, SWI and diffusion weighted views.  T1 sagittal and postcontrast views in the axial and coronal plane were obtained.  Additional thin section pre-and post contrasted axial and coronal T1-weighted and axial  CISS images through the internal auditory canals were performed. CONTRAST: 19 ml Multihance IMAGING SITE: [facility], [place]. FINDINGS: On sagittal images, the spinal cord is imaged caudally to C3 and is normal in caliber.   The contents of the posterior fossa are of normal size and position.   The pituitary gland and optic chiasm appear normal.   The  third and lateral ventricles are mildly enlarged, in proportion to the extent of mild generalized cortical atrophy.  There are no abnormal extra-axial collections of fluid.  There are T2/FLAIR hyperintense foci in the pons.  The cerebellum and deep gray matter appears normal.  In the hemispheres, there are some scattered T2/FLAIR hyperintense foci in the subcortical and deep white matter.  None of the foci appears to be acute.   Diffusion weighted images are normal.  Susceptibility weighted images are normal.   The VIIth/VIIIth nerve complexes appear normal before and after contrast.  There have been bilateral lens replacements.  The orbits are otherwise normal.  The mastoid air cells appear normal.  The paranasal sinuses appear normal.  Flow voids are identified within the major intracerebral arteries.  After the infusion of contrast material, a normal enhancement pattern is noted.   This MRI of the brain with and without contrast with added attention  to the internal auditory canal shows the following: 1.     T2/FLAIR hypertense foci in the pons and hemispheres consistent with chronic microvascular ischemic change.  None of the foci appears to be acute. 2.     Mild generalized cortical atrophy. 3.     The internal auditory canals appear normal before and after contrast. INTERPRETING PHYSICIAN: Richard A. Felecia Shelling, MD, PhD, FAAN Certified in  Neuroimaging by [employer]    Time Spent in minutes  30   Lala Lund M.D on 01/22/2018 at 10:39 AM  To page go to [email or web address] - password [facility]

## 2018-01-23 ENCOUNTER — Encounter (HOSPITAL_COMMUNITY): Payer: Self-pay | Admitting: Neurological Surgery

## 2018-01-23 DIAGNOSIS — S14156A Other incomplete lesion at C6 level of cervical spinal cord, initial encounter: Secondary | ICD-10-CM

## 2018-01-23 DIAGNOSIS — K592 Neurogenic bowel, not elsewhere classified: Secondary | ICD-10-CM

## 2018-01-23 DIAGNOSIS — N319 Neuromuscular dysfunction of bladder, unspecified: Secondary | ICD-10-CM

## 2018-01-23 LAB — BASIC METABOLIC PANEL
Anion gap: 8 (ref 5–15)
BUN: 20 mg/dL (ref 8–23)
CO2: 24 mmol/L (ref 22–32)
Calcium: 8.4 mg/dL — ABNORMAL LOW (ref 8.9–10.3)
Chloride: 98 mmol/L (ref 98–111)
Creatinine, Ser: 0.92 mg/dL (ref 0.44–1.00)
GFR calc Af Amer: >60 mL/min (ref >60)
GFR calc non Af Amer: >60 mL/min (ref >60)
Glucose, Bld: 189 mg/dL — ABNORMAL HIGH (ref 70–99)
Potassium: 4.4 mmol/L (ref 3.5–5.1)
Sodium: 130 mmol/L — ABNORMAL LOW (ref 135–145)

## 2018-01-23 LAB — CBC
HCT: 31.5 % — ABNORMAL LOW (ref 36.0–46.0)
Hemoglobin: 10.4 g/dL — ABNORMAL LOW (ref 12.0–15.0)
MCH: 28.5 pg (ref 26.0–34.0)
MCHC: 33 g/dL (ref 30.0–36.0)
MCV: 86.3 fL (ref 80.0–100.0)
Platelets: 164 10*3/uL (ref 150–400)
RBC: 3.65 MIL/uL — ABNORMAL LOW (ref 3.87–5.11)
RDW: 13.2 % (ref 11.5–15.5)
WBC: 11.7 10*3/uL — ABNORMAL HIGH (ref 4.0–10.5)
nRBC: 0 % (ref 0.0–0.2)

## 2018-01-23 LAB — GLUCOSE, CAPILLARY
Glucose-Capillary: 201 mg/dL — ABNORMAL HIGH (ref 70–99)
Glucose-Capillary: 157 mg/dL — ABNORMAL HIGH (ref 70–99)
Glucose-Capillary: 160 mg/dL — ABNORMAL HIGH (ref 70–99)
Glucose-Capillary: 135 mg/dL — ABNORMAL HIGH (ref 70–99)
Glucose-Capillary: 131 mg/dL — ABNORMAL HIGH (ref 70–99)

## 2018-01-23 LAB — URIC ACID: Uric Acid, Serum: 5.6 mg/dL (ref 2.5–7.1)

## 2018-01-23 LAB — SODIUM, URINE, RANDOM: Sodium, Ur: 22 mmol/L

## 2018-01-23 LAB — OSMOLALITY, URINE: Osmolality, Ur: 152 mOsm/kg — ABNORMAL LOW (ref 300–900)

## 2018-01-23 MED ORDER — FLEET ENEMA 7-19 GM/118ML RE ENEM: 1.0000 | ENEMA | Freq: Once | RECTAL | Status: DC | PRN

## 2018-01-23 MED ORDER — OXYCODONE-ACETAMINOPHEN 5-325 MG PO TABS: 1.0000 | ORAL_TABLET | ORAL | Status: DC | PRN

## 2018-01-23 MED ORDER — ALUM & MAG HYDROXIDE-SIMETH 200-200-20 MG/5ML PO SUSP: 30.0000 mL | Freq: Four times a day (QID) | ORAL | Status: DC | PRN

## 2018-01-23 MED ORDER — METHOCARBAMOL 1000 MG/10ML IJ SOLN
500.0000 mg | Freq: Four times a day (QID) | INTRAVENOUS | Status: DC | PRN
  Administered 2018-01-23: 500 mg via INTRAVENOUS
  Filled 2018-01-23 (×3): qty 5

## 2018-01-23 MED ORDER — MENTHOL 3 MG MT LOZG: 1.0000 | LOZENGE | OROMUCOSAL | Status: DC | PRN

## 2018-01-23 MED ORDER — SODIUM CHLORIDE 0.9% FLUSH: 3.0000 mL | Freq: Two times a day (BID) | INTRAVENOUS | Status: DC

## 2018-01-23 MED ORDER — POLYETHYLENE GLYCOL 3350 17 G PO PACK: 17.0000 g | PACK | Freq: Every day | ORAL | Status: DC | PRN

## 2018-01-23 MED ORDER — ONDANSETRON HCL 4 MG PO TABS: 4.0000 mg | ORAL_TABLET | Freq: Four times a day (QID) | ORAL | Status: DC | PRN

## 2018-01-23 MED ORDER — SENNA 8.6 MG PO TABS
1.0000 | ORAL_TABLET | Freq: Two times a day (BID) | ORAL | Status: DC
  Administered 2018-01-23 – 2018-01-29 (×13): 8.6 mg via ORAL
  Filled 2018-01-23 (×13): qty 1

## 2018-01-23 MED ORDER — ONDANSETRON HCL 4 MG/2ML IJ SOLN: 4.0000 mg | Freq: Four times a day (QID) | INTRAMUSCULAR | Status: DC | PRN

## 2018-01-23 MED ORDER — ACETAMINOPHEN 650 MG RE SUPP: 650.0000 mg | RECTAL | Status: DC | PRN

## 2018-01-23 MED ORDER — VANCOMYCIN HCL 10 G IV SOLR
1250.0000 mg | INTRAVENOUS | Status: DC
  Administered 2018-01-24 – 2018-01-26 (×3): 1250 mg via INTRAVENOUS
  Filled 2018-01-23 (×3): qty 1250

## 2018-01-23 MED ORDER — LACTATED RINGERS IV SOLN
INTRAVENOUS | Status: DC
  Administered 2018-01-23: via INTRAVENOUS

## 2018-01-23 MED ORDER — SODIUM CHLORIDE 0.9% FLUSH: 3.0000 mL | INTRAVENOUS | Status: DC | PRN

## 2018-01-23 MED ORDER — POLYETHYLENE GLYCOL 3350 17 G PO PACK
17.0000 g | PACK | Freq: Two times a day (BID) | ORAL | Status: DC
  Administered 2018-01-23 – 2018-01-29 (×12): 17 g via ORAL
  Filled 2018-01-23 (×13): qty 1

## 2018-01-23 MED ORDER — SODIUM CHLORIDE 0.9 % IV SOLN: 250.0000 mL | INTRAVENOUS | Status: DC

## 2018-01-23 MED ORDER — PHENOL 1.4 % MT LIQD: 1.0000 | OROMUCOSAL | Status: DC | PRN

## 2018-01-23 MED ORDER — DOCUSATE SODIUM 100 MG PO CAPS
100.0000 mg | ORAL_CAPSULE | Freq: Two times a day (BID) | ORAL | Status: DC
  Administered 2018-01-23 – 2018-01-29 (×13): 100 mg via ORAL
  Filled 2018-01-23 (×13): qty 1

## 2018-01-23 MED ORDER — METHOCARBAMOL 500 MG PO TABS
500.0000 mg | ORAL_TABLET | Freq: Four times a day (QID) | ORAL | Status: DC | PRN
  Administered 2018-01-23 – 2018-01-29 (×7): 500 mg via ORAL
  Filled 2018-01-23 (×7): qty 1

## 2018-01-23 MED ORDER — NAPHAZOLINE-GLYCERIN 0.012-0.2 % OP SOLN
1.0000 [drp] | Freq: Four times a day (QID) | OPHTHALMIC | Status: DC | PRN
  Filled 2018-01-23: qty 15

## 2018-01-23 MED ORDER — MORPHINE SULFATE (PF) 2 MG/ML IV SOLN
2.0000 mg | INTRAVENOUS | Status: DC | PRN
  Administered 2018-01-23 – 2018-01-28 (×16): 2 mg via INTRAVENOUS
  Filled 2018-01-23 (×16): qty 1

## 2018-01-23 MED ORDER — BISACODYL 10 MG RE SUPP: 10.0000 mg | Freq: Every day | RECTAL | Status: DC | PRN

## 2018-01-23 MED ORDER — VANCOMYCIN HCL 10 G IV SOLR
1750.0000 mg | INTRAVENOUS | Status: DC
  Administered 2018-01-23: 1750 mg via INTRAVENOUS
  Filled 2018-01-23 (×2): qty 1750

## 2018-01-23 MED ORDER — ACETAMINOPHEN 325 MG PO TABS
650.0000 mg | ORAL_TABLET | ORAL | Status: DC | PRN
  Administered 2018-01-25 – 2018-01-26 (×2): 650 mg via ORAL
  Filled 2018-01-23 (×3): qty 2

## 2018-01-23 MED ORDER — OXYCODONE-ACETAMINOPHEN 5-325 MG PO TABS
1.0000 | ORAL_TABLET | ORAL | Status: DC | PRN
  Administered 2018-01-23 – 2018-01-27 (×18): 1 via ORAL
  Filled 2018-01-23 (×18): qty 1

## 2018-01-23 MED FILL — Gelatin Absorbable MT Powder: OROMUCOSAL | Qty: 1 | Status: AC

## 2018-01-23 MED FILL — Thrombin (Recombinant) For Soln 5000 Unit: CUTANEOUS | Qty: 5000 | Status: AC

## 2018-01-23 NOTE — Progress Notes (Signed)
Pharmacy Antibiotic Note  [name] is a 77 y.o. female s/p laminectomy and drain placement.  Pharmacy has been consulted for Vancomycin dosing.  Vancomycin 1 g IV given preop at [place]: Vancomycin mg IV q24h   Height: 5\' 4"  (162.6 cm) Weight: 207 lb 14.3 oz (94.3 kg) IBW/kg (Calculated) : 54.7  Temp (24hrs), Avg:98.3 F (36.8 C), Min:97.5 F (36.4 C), Max:98.9 F (37.2 C)  Recent Labs  Lab 01/21/18 1612 01/22/18 0042  WBC 19.0* 10.2  CREATININE 0.84 0.84    Estimated Creatinine Clearance: 64.4 mL/min (by C-G formula based on SCr of 0.84 mg/dL).    Allergies  Allergen Reactions  . Amoxil [Amoxicillin] Swelling and Other (See Comments)    Lips Has patient had a PCN reaction causing immediate rash, facial/tongue/throat swelling, SOB or lightheadedness with hypotension:YES Has patient had a PCN reaction causing severe rash involving mucus membranes or skin necrosis:No Has patient had a PCN reaction that required hospitalization:No Has patient had a PCN reaction occurring within the last 10 years:Yes. If all of the above answers are "NO", then may proceed with Cephalosporin use.   . Chocolate Hives  . Demerol Nausea And Vomiting  . Lisinopril Itching and Swelling  . Meperidine     Other reaction(s): Vomiting (intolerance)  . Meperidine Hcl     Other reaction(s): Vomiting (intolerance)  . Other     Raw food or nuts - GI pain    Caryl Pina 01/23/2018 12:05 AM

## 2018-01-23 NOTE — Progress Notes (Signed)
Chaplain and patient know each other outside hospital setting.  Pt called mutual friend and requested chaplain come and pray with her.  Chaplain offered ministry of presence and prayer. Pt is grateful for good care she has rec'd today on 4N. Chaplain will be unavailable tomorrow attending educational unit off-campus and Friday (as she is on call Friday night)  but will be available later. Tamsen Snider Pager [phone]

## 2018-01-23 NOTE — Evaluation (Signed)
Physical Therapy Evaluation Patient Details Name: [name] MRN: [id number] DOB: October 04, 1940 Today's Date: 01/23/2018   History of Present Illness  77 y.o. female admitted on 01/21/18 after falling, hitting her head.  She was dx with C6/7 spinal cord contusion and compression.  Pt underwent C6/7 decompression and C4-T1 posterior fusion on 01/22/18.  Pt with significant PMH of vertigo, DM2, fibromyalgia, essential HTN, anemia, L TKA, rotator cuff repair, neck surgery (ACDF), back surgery, ankle surgery x 2.  Clinical Impression  Pt was able to tolerate sitting EOB with two person assist for a short period (~10-15 mins) limited by posterior neck pain and burning.  She was even willing to attempt standing over weak legs (three person total assist).  Ultimately we used the maxi sky lift to get up OOB to chair for the first time after surgery.   PT to follow acutely for deficits listed below.      Follow Up Recommendations CIR    Equipment Recommendations  Wheelchair (measurements PT);Wheelchair cushion (measurements PT);Hospital bed    Recommendations for Other Services Rehab consult     Precautions / Restrictions Precautions Precautions: Fall Precaution Comments: 4 extremity weakness Required Braces or Orthoses: Cervical Brace Cervical Brace: Hard collar;At all times Restrictions Weight Bearing Restrictions: No      Mobility  Bed Mobility Overal bed mobility: Needs Assistance Bed Mobility: Sidelying to Sit;Rolling;Sit to Sidelying Rolling: Max assist Sidelying to sit: Max assist;+2 for physical assistance     Sit to sidelying: Total assist;+2 for physical assistance General bed mobility comments: pt rolling towards L side and able to assist pushing through R LE a little, max assist to ascend from sidelying to sitting and fatigues with prolonged sitting EOB requiring total assist to return to supine   Transfers Overall transfer level: Needs assistance Equipment used: 2  person hand held assist Transfers: Sit to/from Stand Sit to Stand: Total assist;+2 physical assistance(+3)         General transfer comment: patient complete total assist partial sit to stand at EOB with +3 assist.  Two in front blocking knees and supporting pt by gait belt and pad and one person from behind providing lift at buttocks/ischiums.  Pt was ultimately lifted OOB to chair via Maxi sky lift as it was safer at this time.   Ambulation/Gait             General Gait Details: Unable at this time.  Stairs            Wheelchair Mobility    Modified Rankin (Stroke Patients Only)       Balance Overall balance assessment: Needs assistance;History of Falls Sitting-balance support: Feet supported;Bilateral upper extremity supported Sitting balance-[name]: Poor Sitting balance - Comments: posterior lean, limited tolerance due to pain, requring max to total assist Postural control: Posterior lean Standing balance support: Bilateral upper extremity supported Standing balance-[name]: Zero Standing balance comment: total assist to stand                             Pertinent Vitals/Pain Pain Assessment: Faces Faces Pain Scale: Hurts whole lot Pain Location: neck Pain Descriptors / Indicators: Constant;Discomfort;Operative site guarding;Grimacing;Heaviness Pain Intervention(s): Limited activity within patient's tolerance;Monitored during session;Repositioned    Home Living Family/patient expects to be discharged to:: Private residence Living Arrangements: Alone   Type of Home: House Home Access: Ramped entrance     Home Layout: One level Home Equipment: Clinical cytogeneticist -  2 wheels;Bedside commode;Hand held shower head;Wheelchair - manual;Grab bars - tub/shower;Grab bars - toilet;Cane - quad Additional Comments: adjustable bed     Prior Function Level of Independence: Independent with assistive device(s)         Comments: reports using  quad cane for mobility, able to complete ADLs and IADLs with independence.; independent med and financial mgmt      Hand Dominance   Dominant Hand: Right    Extremity/Trunk Assessment   Upper Extremity Assessment Upper Extremity Assessment: Defer to OT evaluation    Lower Extremity Assessment Lower Extremity Assessment: RLE deficits/detail;LLE deficits/detail RLE Deficits / Details: bil LE weakness and paresthesia, pt able to localize to ouch, anke 3-/5, knee extension 2+-3-/5, hip flexion (unable against gravity 2/5.   RLE Sensation: decreased light touch;history of peripheral neuropathy LLE Deficits / Details: bil LE weakness and paresthesia, pt able to localize to ouch, anke 3-/5, knee extension 2+-3-/5, hip flexion (unable against gravity 2/5.   LLE Sensation: decreased light touch;history of peripheral neuropathy    Cervical / Trunk Assessment Cervical / Trunk Assessment: Other exceptions Cervical / Trunk Exceptions: h/o ACDF and back surgery.  Pt with weak trunkal support in sitting, difficulty maintaining balance.   Communication   Communication: No difficulties  Cognition Arousal/Alertness: Awake/alert Behavior During Therapy: WFL for tasks assessed/performed Overall Cognitive Status: Within Functional Limits for tasks assessed                                 General Comments: Appears WNL, A and O x4             Assessment/Plan    PT Assessment Patient needs continued PT services  PT Problem List Decreased strength;Decreased activity tolerance;Decreased balance;Decreased mobility;Decreased coordination;Decreased knowledge of use of DME;Decreased knowledge of precautions;Impaired sensation;Pain       PT Treatment Interventions DME instruction;Gait training;Functional mobility training;Therapeutic activities;Stair training;Therapeutic exercise;Balance training;Neuromuscular re-education;Patient/family education;Wheelchair mobility training    PT  Goals (Current goals can be found in the Care Plan section)  Acute Rehab PT Goals Patient Stated Goal: to get back to sewing  PT Goal Formulation: With patient Time For Goal Achievement: 02/06/18 Potential to Achieve Goals: Good    Frequency Min 3X/week   Barriers to discharge        Co-evaluation PT/OT/SLP Co-Evaluation/Treatment: Yes Reason for Co-Treatment: Complexity of the patient's impairments (multi-system involvement);Necessary to address cognition/behavior during functional activity;For patient/therapist safety;To address functional/ADL transfers PT goals addressed during session: Mobility/safety with mobility;Balance;Strengthening/ROM         AM-PAC PT "6 Clicks" Mobility  Outcome Measure Help needed turning from your back to your side while in a flat bed without using bedrails?: Total Help needed moving from lying on your back to sitting on the side of a flat bed without using bedrails?: Total Help needed moving to and from a bed to a chair (including a wheelchair)?: Total Help needed standing up from a chair using your arms (e.g., wheelchair or bedside chair)?: Total Help needed to walk in hospital room?: Total Help needed climbing 3-5 steps with a railing? : Total 6 Click Score: 6    End of Session Equipment Utilized During Treatment: Cervical collar Activity Tolerance: Patient limited by pain Patient left: in chair;with call bell/phone within reach;with chair alarm set Nurse Communication: Mobility status;Need for lift equipment PT Visit Diagnosis: Muscle weakness (generalized) (M62.81);Difficulty in walking, not elsewhere classified (R26.2);Other symptoms and signs involving the nervous  system (R29.898);Other (comment)(quadriplegia)    Time: 9833-8250 PT Time Calculation (min) (ACUTE ONLY): 62 min   Charges:          Wells Guiles B. Lashunda Greis, PT, DPT  Acute Rehabilitation [phone] pager #[phone] office   PT Evaluation $PT Eval Moderate  Complexity: 1 Mod PT Treatments $Therapeutic Activity: 8-22 mins       01/23/2018, 9:52 PM

## 2018-01-23 NOTE — Evaluation (Signed)
Occupational Therapy Evaluation Patient Details Name: [name] MRN: [id number] DOB: [birth year] Today's Date: 01/23/2018    History of Present Illness 77 y.o. female admitted on 01/21/18 after falling, hitting her head.  She was dx with C6/7 spinal cord contusion and compression.  Pt underwent C6/7 decompression and C4-T1 posterior fusion on 01/22/18.  Pt with significant PMH of vertigo, DM2, fibromyalgia, essential HTN, anemia, L TKA, rotator cuff repair, neck surgery (ACDF), back surgery, ankle surgery x 2.   Clinical Impression   PTA patient reports independent with ADLs and IADLs using quad cane, she has assist for heavy IADLs (cleaning and yardwork).  She was admitted for above and is limited by pain, decreased functional use of B UEs (some grasp in L hand, not R hand; proximal control but weakness L>R), impaired balance, decreased activity tolerance, and impaired sensation.  Patient currently requires max assist for eating, grooming, and UB ADLs, total assist +2 for LB ADLs, completed maxisky transfer with PT total assist after OT exiting room.  Patient will benefit from continued OT services while admitted and after dc at CIR level in order to optimize independence and safety with ADLs and functional transfers.  Will continue to follow.     Follow Up Recommendations  CIR    Equipment Recommendations  Other (comment)(TBD at next venue of care)    Recommendations for Other Services Rehab consult     Precautions / Restrictions Precautions Precautions: Fall Required Braces or Orthoses: Cervical Brace Cervical Brace: Hard collar;At all times Restrictions Weight Bearing Restrictions: No      Mobility Bed Mobility Overal bed mobility: Needs Assistance Bed Mobility: Sidelying to Sit;Rolling;Sit to Sidelying Rolling: Max assist Sidelying to sit: Max assist;+2 for physical assistance     Sit to sidelying: Total assist;+2 for physical assistance General bed mobility comments:  pt rolling towards L side and able to assist pushing through R LE a little, max assist to ascend from sidelying to sitting and fatigues with prolonged sitting EOB requiring total assist to return to supine   Transfers Overall transfer level: Needs assistance Equipment used: 2 person hand held assist Transfers: Sit to/from Stand Sit to Stand: Total assist;+2 physical assistance(+3)         General transfer comment: patient complete total assist partial sit to stand at EOB with +3; repositioned to supine for maxisky transfer to recliner (see PT note)     Balance Overall balance assessment: Needs assistance;History of Falls(per pt h/o vertigo ) Sitting-balance support: Feet supported;Bilateral upper extremity supported Sitting balance-[name]: Poor Sitting balance - Comments: posterior lean, limited tolerance, requring max to total assist Postural control: Posterior lean Standing balance support: Bilateral upper extremity supported Standing balance-[name]: Zero                             ADL either performed or assessed with clinical judgement   ADL Overall ADL's : Needs assistance/impaired Eating/Feeding: Maximal assistance;Sitting Eating/Feeding Details (indicate cue type and reason): requires therapist to position cup in L hand able to use B hands (back of R hand) to bring to mouth, anticipate increased assist with self feeding using utensils Grooming: Maximal assistance;Sitting   Upper Body Bathing: Maximal assistance;Sitting   Lower Body Bathing: Total assistance;+2 for physical assistance;Bed level   Upper Body Dressing : Maximal assistance;Sitting   Lower Body Dressing: Total assistance;+2 for physical assistance;Bed level     Toilet Transfer Details (indicate cue type and reason):  deferred, using maximove to transition to chair with total assist          Functional mobility during ADLs: Total assistance;+2 for physical assistance General ADL  Comments: patient limited by neck pain, decreased functional use of B UEs, and impaired balance      Vision Baseline Vision/History: Wears glasses Wears Glasses: At all times Patient Visual Report: No change from baseline Vision Assessment?: No apparent visual deficits     Perception     Praxis      Pertinent Vitals/Pain Pain Assessment: Faces Faces Pain Scale: Hurts whole lot Pain Location: neck Pain Descriptors / Indicators: Constant;Discomfort;Operative site guarding;Grimacing;Heaviness Pain Intervention(s): Monitored during session;Repositioned;Limited activity within patient's tolerance     Hand Dominance Right   Extremity/Trunk Assessment Upper Extremity Assessment Upper Extremity Assessment: RUE deficits/detail;LUE deficits/detail RUE Deficits / Details: generalized weakness, grossly 3/5 shoulder, elbow, wrist; no active hand flexion/extension  RUE Sensation: decreased light touch(localization ) RUE Coordination: decreased fine motor;decreased gross motor LUE Deficits / Details: generalized weakness, stronger than R side: grossly 3+/5 shoulder, elbow, wrist with limited grasp in hand  LUE Sensation: decreased light touch LUE Coordination: decreased fine motor;decreased gross motor   Lower Extremity Assessment Lower Extremity Assessment: Defer to PT evaluation   Cervical / Trunk Assessment Cervical / Trunk Assessment: Other exceptions Cervical / Trunk Exceptions: h/o ACDF and back surgery   Communication Communication Communication: No difficulties   Cognition Arousal/Alertness: Awake/alert Behavior During Therapy: WFL for tasks assessed/performed Overall Cognitive Status: Within Functional Limits for tasks assessed                                     General Comments  Pt reports muscle spasms in legs since fall    Exercises     Shoulder Instructions      Home Living Family/patient expects to be discharged to:: Private residence Living  Arrangements: Alone   Type of Home: House Home Access: Ramped entrance     Home Layout: One level     Bathroom Shower/Tub: Occupational psychologist: Handicapped height     Home Equipment: Clinical cytogeneticist - 2 wheels;Bedside commode;Hand held shower head;Wheelchair - manual;Grab bars - tub/shower;Grab bars - toilet;Cane - quad   Additional Comments: adjustable bed       Prior Functioning/Environment Level of Independence: Independent with assistive device(s)        Comments: reports using quad cane for mobility, able to complete ADLs and IADLs with independence.; independent med and financial mgmt         OT Problem List: Decreased strength;Decreased range of motion;Decreased activity tolerance;Impaired balance (sitting and/or standing);Decreased coordination;Decreased safety awareness;Decreased knowledge of use of DME or AE;Decreased knowledge of precautions;Impaired sensation;Impaired tone;Impaired UE functional use;Pain      OT Treatment/Interventions: Self-care/ADL training;Therapeutic exercise;Neuromuscular education;Energy conservation;DME and/or AE instruction;Therapeutic activities;Splinting;Modalities;Manual therapy;Patient/family education;Balance training    OT Goals(Current goals can be found in the care plan section) Acute Rehab OT Goals Patient Stated Goal: to get back to sewing  OT Goal Formulation: With patient Time For Goal Achievement: 02/06/18 Potential to Achieve Goals: Good  OT Frequency: Min 3X/week   Barriers to D/C:            Co-evaluation              AM-PAC OT "6 Clicks" Daily Activity     Outcome Measure Help from another person eating meals?: A Lot Help  from another person taking care of personal grooming?: A Lot Help from another person toileting, which includes using toliet, bedpan, or urinal?: Total Help from another person bathing (including washing, rinsing, drying)?: A Lot Help from another person to put on and  taking off regular upper body clothing?: A Lot Help from another person to put on and taking off regular lower body clothing?: Total 6 Click Score: 10   End of Session Equipment Utilized During Treatment: Gait belt Nurse Communication: Mobility status  Activity Tolerance: Patient limited by pain;Patient tolerated treatment well Patient left: Other (comment)(with PT )  OT Visit Diagnosis: Unsteadiness on feet (R26.81);Other abnormalities of gait and mobility (R26.89);Muscle weakness (generalized) (M62.81);Other symptoms and signs involving the nervous system (R29.898);Pain Pain - part of body: (neck)                Time: 1340-1413 OT Time Calculation (min): 33 min Charges:  OT General Charges $OT Visit: 1 Visit OT Evaluation $OT Eval High Complexity: 1 High  Delight Stare, OT Acute Rehabilitation Services Pager [phone] Office [phone]   Delight Stare 01/23/2018, 4:57 PM

## 2018-01-23 NOTE — Progress Notes (Signed)
Patient ID: [name], female   DOB: 15-May-1940, 77 y.o.   MRN: [id number] Vital signs are stable Received 2 units of packed cells last night Post transfusion hemoglobin is 10.2 Blood loss secondary to acute blood loss anemia from surgery Drain with moderate output Motor function appears to be about the same Right upper extremity weak in the bicep wrist extensor and grip compared to the left side Has distal lower extremity function with dorsi and plantarflexion that is weak but present Plan: Patient will need extensive rehabilitation will start with PT today Maintain in ICU as long as drain is present

## 2018-01-23 NOTE — Consult Note (Signed)
Physical Medicine and Rehabilitation Consult   Reason for Consult: Quadriplegia due to cervical spine fracture Referring Physician: Dr. Candiss Norse   HPI: [name] is a 77 y.o. female with history of T2DM, HTN, PSVT, Vertigo with VB TIA's, chronic UTIs, DDD s/p cervical decompression due to myelopathy 10/2017 with residual BUE weakness and gait disorder with multiple falls in the past. She was admitted on 01/21/18 with fall off her porch while trying to open the door with worsening of weakness and severe pain. She was started on antibiotics due to concerns of UTI and work up done revealing C7 vertebral and posterior element fracture with cord edema C6-C7, extensive edema and hemorrhage posterior neck with possible complex ligamentous injury and non-compressive dorsal edema throughout cervical and visualized upper thoracic spine. She developed paraplegia post admission on 12/10 and was taken to OR for posterior decompression of acute fracture dislocation C6-C7 with posterior fixation C4-T1 by Dr. [name]. ABLA treated with 2 units PRBC. Therapy evaluations to be done today. CIR consulted in anticipation of extensive rehab needs.   Patient has not worked with physical therapy yet.  She has some drowsiness related to her pain medication which she received at 1107.  She still getting IV pain medications.  Review of Systems  Constitutional: Negative for chills and fever.  HENT: Negative for hearing loss and tinnitus.   Eyes: Negative for blurred vision and double vision.  Respiratory: Negative for cough and shortness of breath.   Cardiovascular: Positive for leg swelling. Negative for chest pain and palpitations.  Gastrointestinal: Positive for heartburn. Negative for abdominal pain, diarrhea and nausea.  Genitourinary: Positive for frequency and urgency.       Chronic problems--hesitancy, stress incontinence, nocturia X 4 at baseline.   Musculoskeletal: Positive for back pain, falls (due to  bilateral knee instability), joint pain (needs right knee replaced. ), myalgias and neck pain.  Skin: Negative for rash.  Neurological: Positive for dizziness (for the past few months), sensory change (numbness BUE/BLE), focal weakness (bilateral hand weakness X year) and weakness.  Psychiatric/Behavioral: Negative for memory loss.     Past Medical History:  Diagnosis Date  . Anemia    during 1st pregnancy  . Anxiety   . Arthritis   . Complication of anesthesia   . Depression   . Diarrhea, functional   . Dysrhythmia    Paroysmal Atrial Tachycardia  . Essential hypertension   . Fibromyalgia   . GERD (gastroesophageal reflux disease)   . H/O acute pancreatitis   . Headache   . History of hiatal hernia   . Hyperlipemia   . Osteoporosis   . PAT (paroxysmal atrial tachycardia) ([place])   . Pneumonia   . PONV (postoperative nausea and vomiting)   . PSVT (paroxysmal supraventricular tachycardia) ([place])   . Reflux   . Restless leg syndrome   . Type 2 diabetes mellitus (HCC)    Type II  . Vertigo     Past Surgical History:  Procedure Laterality Date  . ABDOMINAL HYSTERECTOMY    . ANKLE SURGERY    . ANTERIOR CERVICAL DECOMP/DISCECTOMY FUSION N/A 11/12/2017   Procedure: Cervical seven to Thoracic one  Anterior cervical decompression/discectomy/fusion with removal of old plate;  Surgeon: [name], MD;  Location: [facility];  Service: Neurosurgery;  Laterality: N/A;  . BACK SURGERY     T12-L1 fusion, Anterior Cervical fusion  . CARDIAC CATHETERIZATION N/A 02/16/2015   Procedure: Left Heart Cath and Coronary Angiography;  Surgeon: Legrand Como  Burt Knack, MD;  Location: [facility];  Service: Cardiovascular;  Laterality: N/A;  . CHOLECYSTECTOMY    . COLONOSCOPY    . ESOPHAGOGASTRODUODENOSCOPY  12/21/2010   Procedure: ESOPHAGOGASTRODUODENOSCOPY (EGD);  Surgeon: Landry Dyke, MD;  Location: [facility];  Service: Endoscopy;  Laterality: N/A;  . EXCISION MORTON'S NEUROMA    . EYE SURGERY  Bilateral    Cataract  . FRACTURE SURGERY     ankle x 2   . KNEE SURGERY     x 3  . NECK SURGERY    . ROTATOR CUFF REPAIR    . TONSILLECTOMY    . TOTAL KNEE ARTHROPLASTY Left 04/10/2017   Procedure: LEFT TOTAL KNEE ARTHROPLASTY;  Surgeon: Paralee Cancel, MD;  Location: [facility];  Service: Orthopedics;  Laterality: Left;  70 mins  . UPPER GASTROINTESTINAL ENDOSCOPY    . WRIST SURGERY      Family History  Problem Relation Age of Onset  . Heart failure Mother   . Hyperlipidemia Mother   . Lung cancer Father        Lymphoma  . Hyperlipidemia Maternal Grandmother   . Peripheral vascular disease Maternal Grandmother     Social History:  Lives alone. Independent without AD. Has 2 daughters who work otherwise no other family. Friends can check in.  She reports that she has quit smoking. She has never used smokeless tobacco. She reports that she does not drink alcohol or use drugs.   Allergies  Allergen Reactions  . Amoxil [Amoxicillin] Swelling and Other (See Comments)    Lips Has patient had a PCN reaction causing immediate rash, facial/tongue/throat swelling, SOB or lightheadedness with hypotension:YES Has patient had a PCN reaction causing severe rash involving mucus membranes or skin necrosis:No Has patient had a PCN reaction that required hospitalization:No Has patient had a PCN reaction occurring within the last 10 years:Yes. If all of the above answers are "NO", then may proceed with Cephalosporin use.   . Chocolate Hives  . Demerol Nausea And Vomiting  . Lisinopril Itching and Swelling  . Meperidine     Other reaction(s): Vomiting (intolerance)  . Meperidine Hcl     Other reaction(s): Vomiting (intolerance)  . Other     Raw food or nuts - GI pain    Medications Prior to Admission  Medication Sig Dispense Refill  . acetaminophen (TYLENOL) 325 MG tablet Take 650 mg by mouth 2 (two) times daily as needed for moderate pain.     Marland Kitchen aspirin EC 81 MG tablet Take 81 mg by mouth  every evening.    . Calcium Carb-Cholecalciferol (CALCIUM 600+D3 PO) Take 1 tablet by mouth every evening.    . cholecalciferol (VITAMIN D) 1000 units tablet Take 1,000 Units by mouth daily.    . Cranberry 1000 MG CAPS Take 1,000 mg by mouth 2 (two) times daily.     . digoxin (LANOXIN) 0.125 MG tablet Take 0.125 mg by mouth daily.    . Insulin Glargine (BASAGLAR KWIKPEN) 100 UNIT/ML SOPN Inject 0.6 mLs (60 Units total) into the skin daily. (Patient taking differently: Inject 65 Units into the skin at bedtime. ) 3 pen 1  . liraglutide (VICTOZA) 18 MG/3ML SOPN Inject 0.1 mLs (0.6 mg total) into the skin daily. (Patient taking differently: Inject 0.6 mg into the skin at bedtime. ) 4 pen 6  . metoprolol succinate (TOPROL-XL) 25 MG 24 hr tablet Take 1 tablet (25 mg total) by mouth daily. 30 tablet 0  . Multiple Vitamins-Minerals (PRESERVISION  AREDS 2 PO) Take 1 capsule by mouth 2 (two) times daily.    . nitroGLYCERIN (NITROSTAT) 0.4 MG SL tablet Place 0.4 mg under the tongue every 5 (five) minutes as needed for chest pain.     Marland Kitchen omeprazole (PRILOSEC) 20 MG capsule Take 1 capsule (20 mg total) by mouth daily. (Patient taking differently: Take 20 mg by mouth at bedtime. ) 90 capsule 2  . ramipril (ALTACE) 10 MG capsule Take 1 capsule (10 mg total) by mouth every evening. 90 capsule 0  . rOPINIRole (REQUIP) 1 MG tablet TAKE ONE TABLET BY MOUTH AT BEDTIME. (Patient taking differently: Take 1 mg by mouth at bedtime. ) 90 tablet 1  . sertraline (ZOLOFT) 100 MG tablet Take 1 tablet (100 mg total) by mouth every evening. (Patient taking differently: Take 50 mg by mouth every evening. ) 30 tablet 0  . verapamil (CALAN) 120 MG tablet Take 120 mg by mouth daily as needed (afib).     . verapamil (VERELAN PM) 240 MG 24 hr capsule Take 1 capsule (240 mg total) by mouth daily. 30 capsule 2  . Insulin Pen Needle (BD PEN NEEDLE MICRO U/F) 32G X 6 MM MISC Twice a day (basaglar and victoza) 100 each 6  . methocarbamol  (ROBAXIN) 500 MG tablet Take 1 tablet (500 mg total) by mouth every 6 (six) hours as needed for muscle spasms. (Patient not taking: Reported on 01/21/2018) 40 tablet 3  . RELION INSULIN SYRINGE 1ML/31G 31G X 5/16" 1 ML MISC Use as directed 100 each 1    Home: Home Living Family/patient expects to be discharged to:: Private residence Living Arrangements: Alone  Functional History:   Functional Status:  Mobility:          ADL:    Cognition: Cognition Orientation Level: Oriented X4    Blood pressure (!) 138/58, pulse 81, temperature 99.3 F (37.4 C), temperature source Oral, resp. rate 14, height 5\' 4"  (1.626 m), weight 94.3 kg, SpO2 96 %. Physical Exam  Nursing note and vitals reviewed. Constitutional: She is oriented to person, place, and time. She appears well-developed and well-nourished.  Morbidly obese female sitting up in bed--NAD.   Neck:  Immobilized by collar.   Cardiovascular: Normal rate, regular rhythm and normal heart sounds.  Respiratory: Effort normal and breath sounds normal. No stridor. She has no wheezes.  GI: Soft. Bowel sounds are normal. She exhibits no distension.  Neurological: She is alert and oriented to person, place, and time.  Patient has 3/5 strength in the right deltoid and biceps and wrist extensors 0 at the elbow extensors 0 at the finger flexors Left deltoid 3- biceps 3 triceps 3 wrist extensor 3 finger flexors 0 Right lower extremity 3- hip flexor knee extensor ankle dorsiflexion plantar flexor left lower extremity 2- hip flexor knee extensor ankle dorsiflexor and plantar flexor  Psychiatric: She has a normal mood and affect.    Results for orders placed or performed during the hospital encounter of 01/21/18 (from the past 24 hour(s))  Troponin I - Now Then Q6H     Status: Abnormal   Collection Time: 01/22/18 11:14 AM  Result Value Ref Range   Troponin I 0.36 (HH) <0.03 ng/mL  Glucose, capillary     Status: Abnormal   Collection Time:  01/22/18 11:36 AM  Result Value Ref Range   Glucose-Capillary 183 (H) 70 - 99 mg/dL  Glucose, capillary     Status: Abnormal   Collection Time: 01/22/18  4:18 PM  Result Value Ref Range   Glucose-Capillary 226 (H) 70 - 99 mg/dL  Glucose, capillary     Status: Abnormal   Collection Time: 01/22/18  5:41 PM  Result Value Ref Range   Glucose-Capillary 213 (H) 70 - 99 mg/dL  Surgical pcr screen     Status: None   Collection Time: 01/22/18  5:48 PM  Result Value Ref Range   MRSA, PCR NEGATIVE NEGATIVE   Staphylococcus aureus NEGATIVE NEGATIVE  Glucose, capillary     Status: Abnormal   Collection Time: 01/22/18  6:53 PM  Result Value Ref Range   Glucose-Capillary 198 (H) 70 - 99 mg/dL  Basic metabolic panel     Status: Abnormal   Collection Time: 01/23/18  3:27 AM  Result Value Ref Range   Sodium 130 (L) 135 - 145 mmol/L   Potassium 4.4 3.5 - 5.1 mmol/L   Chloride 98 98 - 111 mmol/L   CO2 24 22 - 32 mmol/L   Glucose, Bld 189 (H) 70 - 99 mg/dL   BUN 20 8 - 23 mg/dL   Creatinine, Ser 0.92 0.44 - 1.00 mg/dL   Calcium 8.4 (L) 8.9 - 10.3 mg/dL   GFR calc non Af Amer >60 >60 mL/min   GFR calc Af Amer >60 >60 mL/min   Anion gap 8 5 - 15  CBC     Status: Abnormal   Collection Time: 01/23/18  3:27 AM  Result Value Ref Range   WBC 11.7 (H) 4.0 - 10.5 K/uL   RBC 3.65 (L) 3.87 - 5.11 MIL/uL   Hemoglobin 10.4 (L) 12.0 - 15.0 g/dL   HCT 31.5 (L) 36.0 - 46.0 %   MCV 86.3 80.0 - 100.0 fL   MCH 28.5 26.0 - 34.0 pg   MCHC 33.0 30.0 - 36.0 g/dL   RDW 13.2 11.5 - 15.5 %   Platelets 164 150 - 400 K/uL   nRBC 0.0 0.0 - 0.2 %  Type and screen [place]     Status: None (Preliminary result)   Collection Time: 01/23/18  3:27 AM  Result Value Ref Range   ABO/RH(D) A NEG    Antibody Screen POS    Sample Expiration 01/26/2018    Antibody Identification ANTI D    Unit Number [id number]    Blood Component Type RBC LR PHER2    Unit division 00    Status of Unit ALLOCATED     Transfusion Status OK TO TRANSFUSE    Crossmatch Result COMPATIBLE    Unit Number [id number]    Blood Component Type RED CELLS,LR    Unit division 00    Status of Unit ALLOCATED    Transfusion Status OK TO TRANSFUSE    Crossmatch Result COMPATIBLE   Glucose, capillary     Status: Abnormal   Collection Time: 01/23/18  8:31 AM  Result Value Ref Range   Glucose-Capillary 157 (H) 70 - 99 mg/dL   Comment 1 Notify RN    Comment 2 Document in Chart    Dg Cervical Spine 2-3 Views  Result Date: 01/22/2018 CLINICAL DATA:  Cervical spine decompression and fusion. EXAM: CERVICAL SPINE - 2-3 VIEW; DG C-ARM 61-120 MIN COMPARISON:  None. FINDINGS: Two C-arm views of the cervical spine demonstrate pedicle screw and rod an plate and screw fusion at multiple levels. The bone details are not adequately visualized to determine the levels involved. IMPRESSION: Operative changes, as described above. Electronically Signed   By: Percell Locus.D.  On: 01/22/2018 22:00   Dg Shoulder Right  Result Date: 01/21/2018 CLINICAL DATA:  Fall EXAM: RIGHT SHOULDER - 2+ VIEW COMPARISON:  07/19/2011 FINDINGS: Negative for fracture or dislocation. Mild degenerative change in the [facility] joint and shoulder joint. Mild associated spurring. IMPRESSION: Negative for fracture or dislocation. Electronically Signed   By: Franchot Gallo M.D.   On: 01/21/2018 14:59   Ct Head Wo Contrast  Result Date: 01/21/2018 CLINICAL DATA:  Patient status post C7-T1 fusion 10/24/2017. History of prior C4-6 fusion. Status post fall off a porch today. Limited range of motion of the head and new tingling in the left fingers since the fall. Initial encounter. EXAM: CT HEAD WITHOUT CONTRAST CT CERVICAL SPINE WITHOUT CONTRAST TECHNIQUE: Multidetector CT imaging of the head and cervical spine was performed following the standard protocol without intravenous contrast. Multiplanar CT image reconstructions of the cervical spine were also generated. COMPARISON:   Brain MRI 01/12/2018. Cervical spine CT scan 12/03/2017. Head CT 09/21/2017. FINDINGS: CT HEAD FINDINGS Brain: No evidence of acute infarction, hemorrhage, hydrocephalus, extra-axial collection or mass lesion/mass effect. Vascular: No hyperdense vessel or unexpected calcification. Skull: Intact.  No focal lesion. Sinuses/Orbits: Small mucous retention cyst or polyp right maxillary sinus is unchanged. The patient is status post cataract surgery bilaterally. Other: None. CT CERVICAL SPINE FINDINGS Alignment: Approximately 0.4 cm anterolisthesis C7 on T1 is unchanged since the prior CT. Alignment is otherwise maintained. Skull base and vertebrae: The patient has an acute fracture of C7. The fracture extends from the left side of the vertebral body into the left C7 facet and base of the left transverse process. The fracture is minimally displaced at 1-2 mm. No other fracture is identified. The interbody spacer and C7 appears to be incorporating into the endplates. Soft tissues and spinal canal: No epidural hematoma is identified. Disc levels: Loss of disc space height and endplate spurring at G9-9 appear unchanged. Upper chest: Lung apices are clear. Other: None. IMPRESSION: Acute fracture of the left side of the C7 vertebral body extends into the left facet and base of the left transverse process. No other acute abnormality is identified. 0.4 cm anterolisthesis C7 on T1 is unchanged. Status post C4-6 and C7-T1 fusion. No acute intracranial normality. Electronically Signed   By: Inge Rise M.D.   On: 01/21/2018 15:32   Ct Cervical Spine Wo Contrast  Result Date: 01/21/2018 CLINICAL DATA:  Patient status post C7-T1 fusion 10/24/2017. History of prior C4-6 fusion. Status post fall off a porch today. Limited range of motion of the head and new tingling in the left fingers since the fall. Initial encounter. EXAM: CT HEAD WITHOUT CONTRAST CT CERVICAL SPINE WITHOUT CONTRAST TECHNIQUE: Multidetector CT imaging of the  head and cervical spine was performed following the standard protocol without intravenous contrast. Multiplanar CT image reconstructions of the cervical spine were also generated. COMPARISON:  Brain MRI 01/12/2018. Cervical spine CT scan 12/03/2017. Head CT 09/21/2017. FINDINGS: CT HEAD FINDINGS Brain: No evidence of acute infarction, hemorrhage, hydrocephalus, extra-axial collection or mass lesion/mass effect. Vascular: No hyperdense vessel or unexpected calcification. Skull: Intact.  No focal lesion. Sinuses/Orbits: Small mucous retention cyst or polyp right maxillary sinus is unchanged. The patient is status post cataract surgery bilaterally. Other: None. CT CERVICAL SPINE FINDINGS Alignment: Approximately 0.4 cm anterolisthesis C7 on T1 is unchanged since the prior CT. Alignment is otherwise maintained. Skull base and vertebrae: The patient has an acute fracture of C7. The fracture extends from the left side of the vertebral body  into the left C7 facet and base of the left transverse process. The fracture is minimally displaced at 1-2 mm. No other fracture is identified. The interbody spacer and C7 appears to be incorporating into the endplates. Soft tissues and spinal canal: No epidural hematoma is identified. Disc levels: Loss of disc space height and endplate spurring at K2-7 appear unchanged. Upper chest: Lung apices are clear. Other: None. IMPRESSION: Acute fracture of the left side of the C7 vertebral body extends into the left facet and base of the left transverse process. No other acute abnormality is identified. 0.4 cm anterolisthesis C7 on T1 is unchanged. Status post C4-6 and C7-T1 fusion. No acute intracranial normality. Electronically Signed   By: Inge Rise M.D.   On: 01/21/2018 15:32   Mr Cervical Spine Wo Contrast  Result Date: 01/22/2018 CLINICAL DATA:  Fall with left-sided vertebral body and posterior element fractures at C7. Prior cervical fusion. Sudden lower extremity weakness.  EXAM: MRI CERVICAL SPINE WITHOUT CONTRAST TECHNIQUE: Multiplanar, multisequence MR imaging of the cervical spine was performed. No intravenous contrast was administered. COMPARISON:  Cervical spine CT 01/21/2018 and MRI 08/07/2017 FINDINGS: Alignment: Grade 1 anterolisthesis of C6 on C7 and C7 on T1, unchanged. Vertebrae: Bilateral C7 facet edema with a left-sided fracture shown on yesterday's CT. Fracture component involving the C7 vertebral body not well demonstrated by MRI with evaluation limited by artifact from C4-T1 anterior fusion plates and screws. Cord: T2 hyperintensity in the cord at C6-7 with cord expansion consistent with edema. Suspected focal hemorrhage along the posterior margin of the cord at C7 (series 8, image 26), uncertain if this is intramedullary or extramedullary in location. Noncompressive dorsal epidural hematoma throughout the cervical and included upper thoracic spine. Posterior Fossa, vertebral arteries, paraspinal tissues: Extensive edema and hematoma throughout the musculature and other soft tissues of the posterior neck bilaterally. STIR hyperintensity in the interspinous soft tissues at C6-7 with possible disruption of the ligamentum flavum. Disc levels: C2-3: Mild facet arthrosis and tiny central disc protrusion without stenosis, unchanged from the prior MRI. C3-4: Broad-based posterior disc osteophyte complex and minimal facet arthrosis result in mild spinal stenosis and moderate to severe right and moderate left neural foraminal stenosis, unchanged from the prior MRI. C4-5: Prior ACDF.  No significant stenosis. C5-6: Prior ACDF.  No significant stenosis. C6-7: Prior ACDF. Slight anterolisthesis with epidural hematoma. Mild spinal stenosis. C7-T1: ACDF. Anterolisthesis and facet arthrosis result in moderate bilateral neural foraminal stenosis. Epidural hematoma with mild spinal stenosis. T1-2: Small right central disc protrusion, unchanged from the prior MRI and slightly deforming  the ventral spinal cord. Unchanged mild left neural foraminal stenosis. T2-3: Mild disc bulging with borderline bilateral neural foraminal stenosis and no spinal stenosis. T3-4: Small central disc extrusion mildly deforming the ventral spinal cord, similar to the prior MRI. T5-6 and T6-7: Only imaged sagittally. Small central disc herniations at both levels contacting and at most slightly deforming the spinal cord. IMPRESSION: 1. C6-7 spinal cord edema with focal dorsal intramedullary versus extramedullary/epidural hemorrhage posteriorly, favor the latter. 2. Extensive edema and hemorrhage throughout the posterior neck soft tissues. Possible posterior ligamentous complex injury at C6-7. 3. C7 vertebral and posterior element fracture better demonstrated on CT. 4. Noncompressive dorsal epidural hematoma throughout the cervical and visualized upper thoracic spine. These results were called by telephone at the time of interpretation on 01/22/2018 at 3:13 pm to Dr. [name], who verbally acknowledged these results. Electronically Signed   By: Seymour Bars.D.  On: 01/22/2018 15:49   Dg Shoulder Left  Result Date: 01/21/2018 CLINICAL DATA:  [address] down stairs EXAM: LEFT SHOULDER - 2+ VIEW COMPARISON:  None. FINDINGS: There is no evidence of fracture or dislocation. There is no evidence of arthropathy or other focal bone abnormality. Soft tissues are unremarkable. IMPRESSION: Negative. Electronically Signed   By: Franchot Gallo M.D.   On: 01/21/2018 15:00   Dg C-arm 1-60 Min  Result Date: 01/22/2018 CLINICAL DATA:  Cervical spine decompression and fusion. EXAM: CERVICAL SPINE - 2-3 VIEW; DG C-ARM 61-120 MIN COMPARISON:  None. FINDINGS: Two C-arm views of the cervical spine demonstrate pedicle screw and rod an plate and screw fusion at multiple levels. The bone details are not adequately visualized to determine the levels involved. IMPRESSION: Operative changes, as described above. Electronically Signed   By:  Claudie Revering M.D.   On: 01/22/2018 22:00     Assessment/Plan: Diagnosis: Quadriplegia secondary to complete cervical spinal cord injury in a patient with prior history of cervical myelopathy 1. Does the need for close, 24 hr/day medical supervision in concert with the patient's rehab needs make it unreasonable for this patient to be served in a less intensive setting? Yes 2. Co-Morbidities requiring supervision/potential complications: Type 2 diabetes, hypertension, PSVT 3. Due to bladder management, bowel management, safety, skin/wound care, disease management, medication administration, pain management and patient education, does the patient require 24 hr/day rehab nursing? Yes 4. Does the patient require coordinated care of a physician, rehab nurse, PT (1-2 hrs/day, 5 days/week) and OT (1-2 hrs/day, 5 days/week) to address physical and functional deficits in the context of the above medical diagnosis(es)? Yes Addressing deficits in the following areas: balance, endurance, locomotion, strength, transferring, bowel/bladder control, bathing, dressing, feeding, grooming, toileting, cognition and psychosocial support 5. Can the patient actively participate in an intensive therapy program of at least 3 hrs of therapy per day at least 5 days per week? Potentially 6. The potential for patient to make measurable gains while on inpatient rehab is good 7. Anticipated functional outcomes upon discharge from inpatient rehab are mod assist  with PT, mod assist with OT, n/a with SLP. 8. Estimated rehab length of stay to reach the above functional goals is: 22 to 26 days 9. Anticipated D/C setting: Home 10. Anticipated post D/C treatments: [place] therapy 11. Overall Rehab/Functional Prognosis: good  RECOMMENDATIONS: This patient's condition is appropriate for continued rehabilitative care in the following setting: CIR once tolerating out of bed with ability to sit in a chair with legs down 3 hours/day Patient  has agreed to participate in recommended program. Yes Note that insurance prior authorization may be required for reimbursement for recommended care.  Comment:  "I have personally performed a face to face diagnostic evaluation of this patient.  Additionally, I have reviewed and concur with the physician assistant's documentation above." Charlett Blake M.D. [facility] FAAPM&R (Sports Med, Neuromuscular Med) Diplomate [place] Board of [place], PA-C 01/23/2018

## 2018-01-23 NOTE — Progress Notes (Signed)
Pharmacy Antibiotic Note  [name] is a 77 y.o. female s/p laminectomy and drain placement.  Pharmacy has been consulted for Vancomycin dosing - day 3. SCr stable 0.92.  Plan: Vancomycin 1250mg  IV q24h Monitor clinical progress, c/s, renal function F/u de-escalation plan/LOT, vancomycin levels as indicated F/u drain removal   Height: 5\' 4"  (162.6 cm) Weight: 207 lb 14.3 oz (94.3 kg) IBW/kg (Calculated) : 54.7  Temp (24hrs), Avg:98.3 F (36.8 C), Min:97.5 F (36.4 C), Max:99.3 F (37.4 C)  Recent Labs  Lab 01/21/18 1612 01/22/18 0042 01/23/18 0327  WBC 19.0* 10.2 11.7*  CREATININE 0.84 0.84 0.92    Estimated Creatinine Clearance: 58.8 mL/min (by C-G formula based on SCr of 0.92 mg/dL).    Allergies  Allergen Reactions  . Amoxil [Amoxicillin] Swelling and Other (See Comments)    Lips Has patient had a PCN reaction causing immediate rash, facial/tongue/throat swelling, SOB or lightheadedness with hypotension:YES Has patient had a PCN reaction causing severe rash involving mucus membranes or skin necrosis:No Has patient had a PCN reaction that required hospitalization:No Has patient had a PCN reaction occurring within the last 10 years:Yes. If all of the above answers are "NO", then may proceed with Cephalosporin use.   . Chocolate Hives  . Demerol Nausea And Vomiting  . Lisinopril Itching and Swelling  . Meperidine     Other reaction(s): Vomiting (intolerance)  . Meperidine Hcl     Other reaction(s): Vomiting (intolerance)  . Other     Raw food or nuts - GI pain   [name], PharmD, BCPS Clinical Pharmacist Clinical phone [phone] Please check AMION for all [place] contact numbers 01/23/2018 10:57 AM

## 2018-01-23 NOTE — Progress Notes (Signed)
PROGRESS NOTE                                                                                                                                                                                                             Patient Demographics:    [name], is a 77 y.o. female, DOB - [birth year], DTO:[id number]  Admit date - 01/21/2018   Admitting Physician [name], MD  Outpatient Primary MD for the patient is [name], Malka So, MD  LOS - 1  Chief Complaint  Patient presents with  . Fall       Brief Narrative  [name] is a 77 y.o. female with history of SVT, hypertension, fibromyalgia, diabetes mellitus had a fall at home while she was trying to open her door.  She struck her head when she fell from a porch.  Denied losing consciousness, nochest pain or palpitations.  He came to the ER where a C-spine CT scan showed an acute C7 fracture, Dr. [name] neurosurgeon on-call was consulted by the ER physician who advised a c-collar which was placed, she was subsequently admitted to the hospital.  Early this morning patient requested that she be placed in the chair as she was not comfortable in the bed, she was placed in the chair with assistance, she did fine however after about 45 minutes of sitting up in the chair she reported that she is unable to move her legs further work-up showed that she had bled into her spinal cord space and she was urgently taken to the OR by neurosurgeon Dr. [name].    Subjective:   Patient in bed, in c-collar, denies any headache, no fever, no chest pain or pressure, no shortness of breath , no abdominal pain.  Chronic upper extremity weakness with right more than left, bilateral lower extremity weakness left greater than right.   Assessment  & Plan :     1.  Mechanical fall, acute C7 fracture.  Complicated by spinal cord hematoma and edema causing cord compression.  Patient had chronic upper extremity weakness right greater than  left and has now developed functional paraplegia with right leg minimally stronger than the left but strength in the right lower extremity is a 1/5 and left lower extremity is 0/5.  She is in c-collar, she was emergently taken to the OR by Dr. [name] on 01/22/2018 and underwent Posterior decompression with laminectomy C6-C7, posterior fixation C4-T1 with T1 pedicle screws and C4-C7 facet screws bilaterally posterior lateral  arthrodesis with local autograft C6 and C7.  Continue supportive care, PT OT, definitely will require CIR/NF.  Has tolerated operative procedure well with minimal perioperative blood loss related anemia.   2.  Multiple C-spine surgeries last CT 7 to T1 fusion in September of this year, multiple falls at home.  Had detailed discussion with patient's daughter bedside, patient has had several falls over the last several months and she had been recommended going to rehab multiple times but patient had been refusing it and been noncompliant with recommendations.  Strictly reinforced that once she gets better she should consider SNF.   3.  Chronic bilateral upper extremity right more than left weakness.  PT OT.  4.  History of SVT.  On metoprolol, verapamil and digoxin combination which will be continued.  5.  HX of restless leg syndrome along with peripheral neuropathy.  On Requip continue.  6.  Essential hypertension.  On combination of metoprolol and ACE inhibitor along with verapamil.  Continue and monitor.  7.  Chronic diastolic CHF EF 30% on echocardiogram done in November 2019.  Mildly elevated troponin and nonspecific non-ACS pattern.  No chest pain, no shortness of breath, EKG nonacute.  Continue combination of aspirin and beta-blocker for secondary prevention and monitor.  No acute issues.   8.  GERD.  On PPI.   9.  UTI.  Placed on Keflex will monitor.  Stop date 01/25/2018.  10.  Hyponatremia.  Could be SIADH.  Check serum osmolality, uric acid level, urine sodium and  osmolality.  Repeat BMP in the morning.   11. DM type II.  On Lantus twice daily along with sliding scale will monitor.  CBGs stable for now.  CBG (last 3)  Recent Labs    01/22/18 1741 01/22/18 1853 01/23/18 0831  GLUCAP 213* 198* 157*      Family Communication  :  Daughter bedside  Code Status :  Full  Disposition Plan  :  TBD  Consults  :  N.Surg  Procedures  :    01/22/18 by Dr [name] - Posterior decompression with laminectomy C6-C7, posterior fixation C4-T1 with T1 pedicle screws and C4-C7 facet screws bilaterally posterior lateral arthrodesis with local autograft C6 and C7.  CT C spine - Acute fracture of the left side of the C7 vertebral body extends into the left facet and base of the left transverse process. No other acute abnormality is identified. 0.4 cm anterolisthesis C7 on T1 is unchanged. Status post C4-6 and C7-T1 fusion.  MRI C Spine - 1. C6-7 spinal cord edema with focal dorsal intramedullary versus extramedullary/epidural hemorrhage posteriorly, favor the latter. 2. Extensive edema and hemorrhage throughout the posterior neck soft tissues. Possible posterior ligamentous complex injury at C6-7. 3. C7 vertebral and posterior element fracture better demonstrated on CT. 4. Noncompressive dorsal epidural hematoma throughout the cervical and visualized upper thoracic spine. These results were called by telephone at the time of interpretation on 01/22/2018 at 3:13 pm to Dr. [name], who verbally acknowledged these results.  TTE Nov 2019 -   Left ventricle: The cavity size was normal. Systolic function was  normal. The estimated ejection fraction was in the range of 60% to 65%. Wall motion was normal; there were no regional wall motion abnormalities. Left ventricular diastolic function parameters were normal for the patient&'s age. - Aortic valve: Trileaflet; mildly thickened, mildly calcified leaflets. Left coronary cusp predominanly affected by calcification.  Sclerosis without stenosis. There was no   regurgitation. - Mitral valve: There  was no significant regurgitation. - Right ventricle: Systolic function was normal. - Atrial septum: No defect or patent foramen ovale was identified. - Tricuspid valve: There was no significant regurgitation. - Pulmonic valve: There was no significant regurgitation.   DVT Prophylaxis  : SCDs   Lab Results  Component Value Date   PLT 164 01/23/2018    Diet :  Diet Order            DIET SOFT Room service appropriate? Yes; Fluid consistency: Thin  Diet effective now               Inpatient Medications Scheduled Meds: . cephALEXin  500 mg Oral Q12H  . digoxin  0.125 mg Oral Daily  . docusate sodium  100 mg Oral BID  . HYDROmorphone      . insulin aspart  0-9 Units Subcutaneous TID WC  . insulin glargine  25 Units Subcutaneous BID  . metoprolol succinate  25 mg Oral Daily  . pantoprazole  40 mg Oral Daily  . polyethylene glycol  17 g Oral BID  . rOPINIRole  1 mg Oral QHS  . senna  1 tablet Oral BID  . verapamil  240 mg Oral Daily   Continuous Infusions: . lactated ringers    . methocarbamol (ROBAXIN) IV    . vancomycin 1,750 mg (01/23/18 0825)   PRN Meds:.acetaminophen **OR** acetaminophen, alum & mag hydroxide-simeth, bisacodyl, diltiazem, diphenhydrAMINE, hydrALAZINE, LORazepam, methocarbamol **OR** methocarbamol (ROBAXIN) IV, metoprolol tartrate, morphine injection, naphazoline-glycerin, nitroGLYCERIN, [DISCONTINUED] ondansetron **OR** ondansetron (ZOFRAN) IV, oxyCODONE-acetaminophen, sodium phosphate  Antibiotics  :   Anti-infectives (From admission, onward)   Start     Dose/Rate Route Frequency Ordered Stop   01/23/18 0600  vancomycin (VANCOCIN) 1,750 mg in sodium chloride 0.9 % 500 mL IVPB     1,750 mg 250 mL/hr over 120 Minutes Intravenous Every 24 hours 01/23/18 0012     01/22/18 2020  bacitracin 50,000 Units in sodium chloride 0.9 % 500 mL irrigation  Status:  Discontinued        As needed 01/22/18 2021 01/22/18 2216   01/22/18 1800  cephALEXin (KEFLEX) capsule 500 mg     500 mg Oral Every 12 hours 01/22/18 0945     01/22/18 0600  ciprofloxacin (CIPRO) IVPB 400 mg  Status:  Discontinued     400 mg 200 mL/hr over 60 Minutes Intravenous Every 12 hours 01/21/18 2237 01/22/18 0906   01/21/18 1845  ciprofloxacin (CIPRO) IVPB 400 mg     400 mg 200 mL/hr over 60 Minutes Intravenous  Once 01/21/18 1832 01/21/18 2033          Objective:   Vitals:   01/23/18 0500 01/23/18 0600 01/23/18 0700 01/23/18 0800  BP: 135/72 (!) 141/51 (!) 138/58 (!) 138/58  Pulse: 86 78 76 81  Resp: 19 13 16 14   Temp:    99.3 F (37.4 C)  TempSrc:    Oral  SpO2: 96% 96% 96% 96%  Weight:      Height:        Wt Readings from Last 3 Encounters:  01/22/18 94.3 kg  12/28/17 94.3 kg  12/21/17 94.3 kg     Intake/Output Summary (Last 24 hours) at 01/23/2018 0932 Last data filed at 01/23/2018 0700 Gross per 24 hour  Intake 2532.32 ml  Output 1470 ml  Net 1062.32 ml     Physical Exam  Awake Alert, wearing c-collar, chronic upper extremity weakness with right more than left, lower extremity right leg 1/5 left  leg 0/5  PERRAL Supple Neck,No JVD, No cervical lymphadenopathy appriciated.  Symmetrical Chest wall movement, Good air movement bilaterally, CTAB RRR,No Gallops, Rubs or new Murmurs, No Parasternal Heave +ve B.Sounds, Abd Soft, No tenderness, No organomegaly appriciated, No rebound - guarding or rigidity. No Cyanosis, Clubbing or edema, No new Rash or bruise       Data Review:    CBC Recent Labs  Lab 01/21/18 1612 01/22/18 0042 01/23/18 0327  WBC 19.0* 10.2 11.7*  HGB 13.8 12.8 10.4*  HCT 42.6 38.8 31.5*  PLT 175 181 164  MCV 87.3 85.7 86.3  MCH 28.3 28.3 28.5  MCHC 32.4 33.0 33.0  RDW 13.1 13.2 13.2  LYMPHSABS 1.1  --   --   MONOABS 0.9  --   --   EOSABS 0.0  --   --   BASOSABS 0.1  --   --     Chemistries  Recent Labs  Lab 01/21/18 1612  01/22/18 0042 01/23/18 0327  NA 136 133* 130*  K 4.1 4.4 4.4  CL 102 98 98  CO2 21* 22 24  GLUCOSE 204* 189* 189*  BUN 22 19 20   CREATININE 0.84 0.84 0.92  CALCIUM 9.6 9.1 8.4*  MG  --  1.6*  --    ------------------------------------------------------------------------------------------------------------------ No results for input(s): CHOL, HDL, LDLCALC, TRIG, CHOLHDL, LDLDIRECT in the last 72 hours.  Lab Results  Component Value Date   HGBA1C 7.6 (H) 11/12/2017   ------------------------------------------------------------------------------------------------------------------ No results for input(s): TSH, T4TOTAL, T3FREE, THYROIDAB in the last 72 hours.  Invalid input(s): FREET3 ------------------------------------------------------------------------------------------------------------------ No results for input(s): VITAMINB12, FOLATE, FERRITIN, TIBC, IRON, RETICCTPCT in the last 72 hours.  Coagulation profile Recent Labs  Lab 01/21/18 1612  INR 0.94    No results for input(s): DDIMER in the last 72 hours.  Cardiac Enzymes Recent Labs  Lab 01/22/18 0042 01/22/18 0645 01/22/18 1114  TROPONINI 0.59* 0.54* 0.36*   ------------------------------------------------------------------------------------------------------------------    Component Value Date/Time   BNP 10.6 02/14/2015 0341    Micro Results Recent Results (from the past 240 hour(s))  Surgical pcr screen     Status: None   Collection Time: 01/22/18  5:48 PM  Result Value Ref Range Status   MRSA, PCR NEGATIVE NEGATIVE Final   Staphylococcus aureus NEGATIVE NEGATIVE Final    Comment: (NOTE) The Xpert SA Assay (FDA approved for NASAL specimens in patients 64 years of age and older), is one component of a comprehensive surveillance program. It is not intended to diagnose infection nor to guide or monitor treatment. Performed at [facility], [place] [address] [place], [place] [address]      Radiology Reports Dg Cervical Spine 2-3 Views  Result Date: 01/22/2018 CLINICAL DATA:  Cervical spine decompression and fusion. EXAM: CERVICAL SPINE - 2-3 VIEW; DG C-ARM 61-120 MIN COMPARISON:  None. FINDINGS: Two C-arm views of the cervical spine demonstrate pedicle screw and rod an plate and screw fusion at multiple levels. The bone details are not adequately visualized to determine the levels involved. IMPRESSION: Operative changes, as described above. Electronically Signed   By: Claudie Revering M.D.   On: 01/22/2018 22:00   Dg Shoulder Right  Result Date: 01/21/2018 CLINICAL DATA:  Fall EXAM: RIGHT SHOULDER - 2+ VIEW COMPARISON:  07/19/2011 FINDINGS: Negative for fracture or dislocation. Mild degenerative change in the [facility] joint and shoulder joint. Mild associated spurring. IMPRESSION: Negative for fracture or dislocation. Electronically Signed   By: Franchot Gallo M.D.   On: 01/21/2018 14:59  Ct Head Wo Contrast  Result Date: 01/21/2018 CLINICAL DATA:  Patient status post C7-T1 fusion 10/24/2017. History of prior C4-6 fusion. Status post fall off a porch today. Limited range of motion of the head and new tingling in the left fingers since the fall. Initial encounter. EXAM: CT HEAD WITHOUT CONTRAST CT CERVICAL SPINE WITHOUT CONTRAST TECHNIQUE: Multidetector CT imaging of the head and cervical spine was performed following the standard protocol without intravenous contrast. Multiplanar CT image reconstructions of the cervical spine were also generated. COMPARISON:  Brain MRI 01/12/2018. Cervical spine CT scan 12/03/2017. Head CT 09/21/2017. FINDINGS: CT HEAD FINDINGS Brain: No evidence of acute infarction, hemorrhage, hydrocephalus, extra-axial collection or mass lesion/mass effect. Vascular: No hyperdense vessel or unexpected calcification. Skull: Intact.  No focal lesion. Sinuses/Orbits: Small mucous retention cyst or polyp right maxillary sinus is unchanged. The patient is status post cataract  surgery bilaterally. Other: None. CT CERVICAL SPINE FINDINGS Alignment: Approximately 0.4 cm anterolisthesis C7 on T1 is unchanged since the prior CT. Alignment is otherwise maintained. Skull base and vertebrae: The patient has an acute fracture of C7. The fracture extends from the left side of the vertebral body into the left C7 facet and base of the left transverse process. The fracture is minimally displaced at 1-2 mm. No other fracture is identified. The interbody spacer and C7 appears to be incorporating into the endplates. Soft tissues and spinal canal: No epidural hematoma is identified. Disc levels: Loss of disc space height and endplate spurring at W5-8 appear unchanged. Upper chest: Lung apices are clear. Other: None. IMPRESSION: Acute fracture of the left side of the C7 vertebral body extends into the left facet and base of the left transverse process. No other acute abnormality is identified. 0.4 cm anterolisthesis C7 on T1 is unchanged. Status post C4-6 and C7-T1 fusion. No acute intracranial normality. Electronically Signed   By: Inge Rise M.D.   On: 01/21/2018 15:32   Ct Cervical Spine Wo Contrast  Result Date: 01/21/2018 CLINICAL DATA:  Patient status post C7-T1 fusion 10/24/2017. History of prior C4-6 fusion. Status post fall off a porch today. Limited range of motion of the head and new tingling in the left fingers since the fall. Initial encounter. EXAM: CT HEAD WITHOUT CONTRAST CT CERVICAL SPINE WITHOUT CONTRAST TECHNIQUE: Multidetector CT imaging of the head and cervical spine was performed following the standard protocol without intravenous contrast. Multiplanar CT image reconstructions of the cervical spine were also generated. COMPARISON:  Brain MRI 01/12/2018. Cervical spine CT scan 12/03/2017. Head CT 09/21/2017. FINDINGS: CT HEAD FINDINGS Brain: No evidence of acute infarction, hemorrhage, hydrocephalus, extra-axial collection or mass lesion/mass effect. Vascular: No hyperdense  vessel or unexpected calcification. Skull: Intact.  No focal lesion. Sinuses/Orbits: Small mucous retention cyst or polyp right maxillary sinus is unchanged. The patient is status post cataract surgery bilaterally. Other: None. CT CERVICAL SPINE FINDINGS Alignment: Approximately 0.4 cm anterolisthesis C7 on T1 is unchanged since the prior CT. Alignment is otherwise maintained. Skull base and vertebrae: The patient has an acute fracture of C7. The fracture extends from the left side of the vertebral body into the left C7 facet and base of the left transverse process. The fracture is minimally displaced at 1-2 mm. No other fracture is identified. The interbody spacer and C7 appears to be incorporating into the endplates. Soft tissues and spinal canal: No epidural hematoma is identified. Disc levels: Loss of disc space height and endplate spurring at K9-9 appear unchanged. Upper chest: Lung apices are  clear. Other: None. IMPRESSION: Acute fracture of the left side of the C7 vertebral body extends into the left facet and base of the left transverse process. No other acute abnormality is identified. 0.4 cm anterolisthesis C7 on T1 is unchanged. Status post C4-6 and C7-T1 fusion. No acute intracranial normality. Electronically Signed   By: Inge Rise M.D.   On: 01/21/2018 15:32   Mr Jodene Nam Head Wo Contrast  Result Date: 01/13/2018  [facility] [address] [place], [place] [address] [phone] NEUROIMAGING REPORT STUDY DATE: 01/12/2018 PATIENT NAME: SUKAINA TOOTHAKER DOB: [birth year] MRN: [id number] EXAM: MR angiogram of the intracranial arteries ORDERING CLINICIAN: Andrey Spearman MD CLINICAL HISTORY: 77 year old woman with vertigo and vertebrobasilar TIAs COMPARISON FILMS: None TECHNIQUE: MR angiogram of the head was obtained utilizing 3D time of flight sequences from below the vertebrobasilar junction up to the intracranial vasculature without contrast.  Computerized reconstructions were  obtained. CONTRAST: None IMAGING SITE: [facility], [place], [place], [place] FINDINGS: The imaged extracranial and intracranial portions of the internal carotid arteries appear normal. The middle cerebral appear normal.  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   In the posterior circulation, the left vertebral artery is mildly dominant.  No stenosis is noted within the vertebral arteries and the basilar arteries.  Posterior communicating arteries are noted bilaterally, right greater than left.  The right posterior artery derives most of its flow from the anterior circulation through the right posterior communicating artery.  The posterior cerebral arteries do not appear to have stenosis.. No aneurysms were identified.   This MR angiogram of the intracranial arteries shows the following: 1.    Normal variant anatomy with an absent A1 segment of the right anterior cerebral artery.  There is a fetal origin of the right posterior cerebral artery. 2.    No significant arterial stenosis.  No aneurysms.  INTERPRETING PHYSICIAN: Richard A. Felecia Shelling, MD, PhD, FAAN Certified in  Neuroimaging by [employer]   Mr Jodene Nam Neck W Wo Contrast  Result Date: 01/13/2018  [facility] [address] [place], [place] [address] [phone] NEUROIMAGING REPORT STUDY DATE: 01/12/2018 PATIENT NAME: KAESHA KIRSCH DOB: 1940/04/07 MRN: [id number] EXAM: MR Angiogram of the Neck Arteries with and without contrast ORDERING CLINICIAN: Andrey Spearman MD CLINICAL HISTORY: 77 year old woman with vertebrobasilar TIAs COMPARISON FILMS: None TECHNIQUE: MR angiogram of the neck was obtained utilizing 3D time-of-flight sequences from the aortic arch up to the intracranial vasculature postbolus contrast infusion.  2D time-of-flight noncontrast views and computerized  reconstructions were obtained. CONTRAST: 19 mL [facility] IMAGING SITE: [facility], [place], [place], [place] FINDINGS: This study is of adequate technical quality.  There is anterograde flow in the bilateral vertebral and carotid arteries on 2D-TOF views.  The flow signal of the left subclavian artery has no stenosis.  The left common and external carotid arteries have no stenosis.  There is minimal (10 to 15%) stenosis at the origin of the left internal carotid artery.  This is hemodynamically significant.  The left vertebral artery is mildly smaller than the right.  The origin appears normal.  There is reduced flow signal in the proximal cervical segment that is most likely artifactual.  The more distal left vertebral artery has no stenosis up to the vertebrobasilar junction.  On the right, the brachiocephalic trunk and subclavian arteries have  no stenosis. The right common, internal and external carotid arteries have no stenosis. The right vertebral artery has no stenosis from its origin to the vertebrobasilar junction. Limited views of the intracranial vasculature are unremarkable.   This MR angiogram of the neck arteries shows the following: 1.    Although there is reduced flow signal in the proximal cervical segment of the left vertebral artery on the source and reconstructed images, this is most likely artifactual as there is completely normal flow more distally. 2.    Minimal (less than 15%) stenosis at the origin of the left internal carotid artery.  This is not hemodynamically significant.  3.    Other arteries appear normal. INTERPRETING PHYSICIAN: Richard A. Felecia Shelling, MD, PhD, FAAN Certified in  Neuroimaging by [employer]   Mr Cervical Spine Wo Contrast  Result Date: 01/22/2018 CLINICAL DATA:  Fall with left-sided vertebral body and posterior element fractures at C7. Prior cervical fusion. Sudden lower extremity weakness. EXAM: MRI CERVICAL SPINE WITHOUT  CONTRAST TECHNIQUE: Multiplanar, multisequence MR imaging of the cervical spine was performed. No intravenous contrast was administered. COMPARISON:  Cervical spine CT 01/21/2018 and MRI 08/07/2017 FINDINGS: Alignment: Grade 1 anterolisthesis of C6 on C7 and C7 on T1, unchanged. Vertebrae: Bilateral C7 facet edema with a left-sided fracture shown on yesterday's CT. Fracture component involving the C7 vertebral body not well demonstrated by MRI with evaluation limited by artifact from C4-T1 anterior fusion plates and screws. Cord: T2 hyperintensity in the cord at C6-7 with cord expansion consistent with edema. Suspected focal hemorrhage along the posterior margin of the cord at C7 (series 8, image 26), uncertain if this is intramedullary or extramedullary in location. Noncompressive dorsal epidural hematoma throughout the cervical and included upper thoracic spine. Posterior Fossa, vertebral arteries, paraspinal tissues: Extensive edema and hematoma throughout the musculature and other soft tissues of the posterior neck bilaterally. STIR hyperintensity in the interspinous soft tissues at C6-7 with possible disruption of the ligamentum flavum. Disc levels: C2-3: Mild facet arthrosis and tiny central disc protrusion without stenosis, unchanged from the prior MRI. C3-4: Broad-based posterior disc osteophyte complex and minimal facet arthrosis result in mild spinal stenosis and moderate to severe right and moderate left neural foraminal stenosis, unchanged from the prior MRI. C4-5: Prior ACDF.  No significant stenosis. C5-6: Prior ACDF.  No significant stenosis. C6-7: Prior ACDF. Slight anterolisthesis with epidural hematoma. Mild spinal stenosis. C7-T1: ACDF. Anterolisthesis and facet arthrosis result in moderate bilateral neural foraminal stenosis. Epidural hematoma with mild spinal stenosis. T1-2: Small right central disc protrusion, unchanged from the prior MRI and slightly deforming the ventral spinal cord.  Unchanged mild left neural foraminal stenosis. T2-3: Mild disc bulging with borderline bilateral neural foraminal stenosis and no spinal stenosis. T3-4: Small central disc extrusion mildly deforming the ventral spinal cord, similar to the prior MRI. T5-6 and T6-7: Only imaged sagittally. Small central disc herniations at both levels contacting and at most slightly deforming the spinal cord. IMPRESSION: 1. C6-7 spinal cord edema with focal dorsal intramedullary versus extramedullary/epidural hemorrhage posteriorly, favor the latter. 2. Extensive edema and hemorrhage throughout the posterior neck soft tissues. Possible posterior ligamentous complex injury at C6-7. 3. C7 vertebral and posterior element fracture better demonstrated on CT. 4. Noncompressive dorsal epidural hematoma throughout the cervical and visualized upper thoracic spine. These results were called by telephone at the time of interpretation on 01/22/2018 at 3:13 pm to Dr. [name], who verbally acknowledged these results. Electronically Signed   By: Zenia Resides  Jeralyn Ruths M.D.   On: 01/22/2018 15:49   Dg Shoulder Left  Result Date: 01/21/2018 CLINICAL DATA:  [address] down stairs EXAM: LEFT SHOULDER - 2+ VIEW COMPARISON:  None. FINDINGS: There is no evidence of fracture or dislocation. There is no evidence of arthropathy or other focal bone abnormality. Soft tissues are unremarkable. IMPRESSION: Negative. Electronically Signed   By: Franchot Gallo M.D.   On: 01/21/2018 15:00   Dg C-arm 1-60 Min  Result Date: 01/22/2018 CLINICAL DATA:  Cervical spine decompression and fusion. EXAM: CERVICAL SPINE - 2-3 VIEW; DG C-ARM 61-120 MIN COMPARISON:  None. FINDINGS: Two C-arm views of the cervical spine demonstrate pedicle screw and rod an plate and screw fusion at multiple levels. The bone details are not adequately visualized to determine the levels involved. IMPRESSION: Operative changes, as described above. Electronically Signed   By: Claudie Revering M.D.   On:  01/22/2018 22:00   Mr Brain/iac W Wo Contrast  Result Date: 01/13/2018  [facility] [address] [place], [place] [address] [phone] NEUROIMAGING REPORT STUDY DATE: 01/12/2018 PATIENT NAME: SHANEIL YAZDI DOB: 12-01-1940 MRN: [id number] EXAM: MRI Brain with and without contrast ORDERING CLINICIAN: Andrey Spearman MD CLINICAL HISTORY: 77 year old woman with vertigo COMPARISON FILMS: None TECHNIQUE:MRI of the brain with and without contrast was obtained utilizing 5 mm axial slices with T1, T2, T2 flair, SWI and diffusion weighted views.  T1 sagittal and postcontrast views in the axial and coronal plane were obtained.  Additional thin section pre-and post contrasted axial and coronal T1-weighted and axial  CISS images through the internal auditory canals were performed. CONTRAST: 19 ml [employer] IMAGING SITE: [employer], [employer]. FINDINGS: On sagittal images, the spinal cord is imaged caudally to C3 and is normal in caliber.   The contents of the posterior fossa are of normal size and position.   The pituitary gland and optic chiasm appear normal.   The third and lateral ventricles are mildly enlarged, in proportion to the extent of mild generalized cortical atrophy.  There are no abnormal extra-axial collections of fluid.  There are T2/FLAIR hyperintense foci in the pons.  The cerebellum and deep gray matter appears normal.  In the hemispheres, there are some scattered T2/FLAIR hyperintense foci in the subcortical and deep white matter.  None of the foci appears to be acute.   Diffusion weighted images are normal.  Susceptibility weighted images are normal.   The VIIth/VIIIth nerve complexes appear normal before and after contrast.  There have been bilateral lens replacements.  The orbits are otherwise normal.  The mastoid air cells appear normal.  The paranasal sinuses appear normal.  Flow voids are identified within the major intracerebral arteries.  After  the infusion of contrast material, a normal enhancement pattern is noted.   This MRI of the brain with and without contrast with added attention to the internal auditory canal shows the following: 1.     T2/FLAIR hypertense foci in the pons and hemispheres consistent with chronic microvascular ischemic change.  None of the foci appears to be acute. 2.     Mild generalized cortical atrophy. 3.     The internal auditory canals appear normal before and after contrast. INTERPRETING PHYSICIAN: Richard A. Felecia Shelling, MD, PhD, FAAN Certified in  Neuroimaging by [employer]    Time Spent in minutes  30   Lala Lund M.D on 01/23/2018 at 9:32 AM  To page go to [email or web address] - password [facility]

## 2018-01-24 LAB — BASIC METABOLIC PANEL
Anion gap: 8 (ref 5–15)
BUN: 19 mg/dL (ref 8–23)
CO2: 24 mmol/L (ref 22–32)
Calcium: 8.3 mg/dL — ABNORMAL LOW (ref 8.9–10.3)
Chloride: 102 mmol/L (ref 98–111)
Creatinine, Ser: 0.81 mg/dL (ref 0.44–1.00)
GFR calc Af Amer: >60 mL/min (ref >60)
GFR calc non Af Amer: >60 mL/min (ref >60)
Glucose, Bld: 112 mg/dL — ABNORMAL HIGH (ref 70–99)
Potassium: 4.2 mmol/L (ref 3.5–5.1)
Sodium: 134 mmol/L — ABNORMAL LOW (ref 135–145)

## 2018-01-24 LAB — GLUCOSE, CAPILLARY
Glucose-Capillary: 127 mg/dL — ABNORMAL HIGH (ref 70–99)
Glucose-Capillary: 140 mg/dL — ABNORMAL HIGH (ref 70–99)

## 2018-01-24 LAB — CBC
HCT: 31.5 % — ABNORMAL LOW (ref 36.0–46.0)
Hemoglobin: 9.9 g/dL — ABNORMAL LOW (ref 12.0–15.0)
MCH: 28 pg (ref 26.0–34.0)
MCHC: 31.4 g/dL (ref 30.0–36.0)
MCV: 89.2 fL (ref 80.0–100.0)
Platelets: 159 10*3/uL (ref 150–400)
RBC: 3.53 MIL/uL — ABNORMAL LOW (ref 3.87–5.11)
RDW: 13.6 % (ref 11.5–15.5)
WBC: 8.8 10*3/uL (ref 4.0–10.5)
nRBC: 0 % (ref 0.0–0.2)

## 2018-01-24 MED ORDER — BISACODYL 5 MG PO TBEC
10.0000 mg | DELAYED_RELEASE_TABLET | Freq: Every day | ORAL | Status: DC
  Administered 2018-01-24: 10 mg via ORAL
  Filled 2018-01-24: qty 2

## 2018-01-24 MED ORDER — MAGNESIUM SULFATE 2 GM/50ML IV SOLN
2.0000 g | Freq: Once | INTRAVENOUS | Status: AC
  Administered 2018-01-24: 2 g via INTRAVENOUS
  Filled 2018-01-24: qty 50

## 2018-01-24 NOTE — NC FL2 (Signed)
[place] LEVEL OF CARE SCREENING TOOL     IDENTIFICATION  Patient Name: [name] Birthdate: Mar 13, 1940 Sex: female Admission Date (Current Location): 01/21/2018  [facility], [place] and [place] Number:  Herbalist and Address:  The . [facility], [place] [address], [place], [place] [address]      Provider Number: [id number]  Attending Physician Name and Address:  Thurnell Lose, MD  Relative Name and Phone Number:       Current Level of Care: Hospital Recommended Level of Care: [place] Prior Approval Number:    Date Approved/Denied:   PASRR Number: [id number]  Discharge Plan: SNF    Current Diagnoses: Patient Active Problem List   Diagnosis Date Noted  . Cervical spine fracture ([place]) 01/22/2018  . Weakness 01/21/2018  . Closed C7 fracture ([place]) 01/21/2018  . Fall   . Lower urinary tract infectious disease   . Cervical spondylosis with myelopathy 11/12/2017  . Tinnitus of both ears 10/19/2017  . Rhinorrhea 09/26/2017  . Chronic diarrhea 08/08/2017  . S/P left TKA 04/10/2017  . Unstable gait 01/12/2017  . Generalized osteoarthritis of hand 01/12/2017  . RLS (restless legs syndrome) 11/09/2016  . GERD (gastroesophageal reflux disease) 07/13/2016  . Class 2 severe obesity due to excess calories with serious comorbidity and body mass index (BMI) of 36.0 to 36.9 in adult ([place]) 06/01/2016  . Vitamin D deficiency 06/01/2016  . Vitamin B12 deficiency 03/27/2016  . Chronic cough 03/16/2016  . Left foot pain 03/16/2016  . Demand ischemia ([place]) 02/14/2015  . Degenerative drusen 07/13/2013  . Type 2 diabetes mellitus without ophthalmic manifestations ([place]) 07/13/2013  . DM (diabetes mellitus) type II controlled, neurological manifestation ([place]) 05/07/2012  . Paroxysmal atrial tachycardia ([place]) 05/07/2012  . Essential hypertension, benign 05/07/2012  . Depression 05/07/2012  . Hyperlipidemia 05/07/2012  . Fibromyalgia  05/07/2012    Orientation RESPIRATION BLADDER Height & Weight     Self, Time, Situation, Place  O2(see DC summary) Continent Weight: 207 lb 14.3 oz (94.3 kg) Height:  5\' 4"  (162.6 cm)  BEHAVIORAL SYMPTOMS/MOOD NEUROLOGICAL BOWEL NUTRITION STATUS      Continent Diet(see DC summary)  AMBULATORY STATUS COMMUNICATION OF NEEDS Skin   Extensive Assist Verbally Surgical wounds(closed neck wound, honeycomb dressing)                       Personal Care Assistance Level of Assistance  Bathing, Feeding, Dressing Bathing Assistance: Maximum assistance Feeding assistance: Limited assistance Dressing Assistance: Maximum assistance     Functional Limitations Info  Sight, Hearing, Speech Sight Info: Adequate Hearing Info: Adequate Speech Info: Adequate    SPECIAL CARE FACTORS FREQUENCY  PT (By licensed PT), OT (By licensed OT)     PT Frequency: 5x/wk OT Frequency: 5x/wk            Contractures Contractures Info: Not present    Additional Factors Info  Code Status, Allergies, Insulin Sliding Scale Code Status Info: Full Allergies Info: Amoxil Amoxicillin, Chocolate, Demerol, Lisinopril, Meperidine, Meperidine Hcl, Other   Insulin Sliding Scale Info: 0-9 units 3x/day with meals; Lantus 25 units 2x/day       Current Medications (01/24/2018):  This is the current hospital active medication list Current Facility-Administered Medications  Medication Dose Route Frequency Provider Last Rate Last Dose  . acetaminophen (TYLENOL) tablet 650 mg  650 mg Oral Q4H PRN [name], MD       Or  . acetaminophen (TYLENOL) suppository  650 mg  650 mg Rectal Q4H PRN [name], MD      . alum & mag hydroxide-simeth (MAALOX/MYLANTA) 200-200-20 MG/5ML suspension 30 mL  30 mL Oral Q6H PRN [name], MD      . bisacodyl (DULCOLAX) EC tablet 10 mg  10 mg Oral Daily Thurnell Lose, MD   10 mg at 01/24/18 0815  . bisacodyl (DULCOLAX) suppository 10 mg  10 mg Rectal Daily PRN Thurnell Lose, MD      . cephALEXin (KEFLEX) capsule 500 mg  500 mg Oral Q12H Thurnell Lose, MD   500 mg at 01/24/18 0545  . digoxin (LANOXIN) tablet 0.125 mg  0.125 mg Oral Daily [name], MD   0.125 mg at 01/24/18 0904  . diltiazem (CARDIZEM) injection 10 mg  10 mg Intravenous Q6H PRN [name], MD      . diphenhydrAMINE (BENADRYL) injection 25 mg  25 mg Intravenous Once PRN [name], MD      . docusate sodium (COLACE) capsule 100 mg  100 mg Oral BID [name], MD   100 mg at 01/24/18 0903  . hydrALAZINE (APRESOLINE) injection 10 mg  10 mg Intravenous Q6H PRN [name], MD      . insulin aspart (novoLOG) injection 0-9 Units  0-9 Units Subcutaneous TID WC [name], MD   1 Units at 01/24/18 1157  . insulin glargine (LANTUS) injection 25 Units  25 Units Subcutaneous BID [name], MD   25 Units at 01/24/18 0901  . lactated ringers infusion   Intravenous Continuous [name], MD 10 mL/hr at 01/24/18 1200    . LORazepam (ATIVAN) injection 1 mg  1 mg Intravenous Once PRN [name], MD      . methocarbamol (ROBAXIN) tablet 500 mg  500 mg Oral Q6H PRN [name], MD   500 mg at 01/24/18 1143   Or  . methocarbamol (ROBAXIN) 500 mg in dextrose 5 % 50 mL IVPB  500 mg Intravenous Q6H PRN [name], MD 100 mL/hr at 01/23/18 1051 500 mg at 01/23/18 1051  . metoprolol succinate (TOPROL-XL) 24 hr tablet 25 mg  25 mg Oral Daily [name], MD   25 mg at 01/24/18 0904  . metoprolol tartrate (LOPRESSOR) injection 5 mg  5 mg Intravenous Q6H PRN [name], MD      . morphine 2 MG/ML injection 2 mg  2 mg Intravenous Q2H PRN [name], MD   2 mg at 01/24/18 0906  . naphazoline-glycerin (CLEAR EYES REDNESS) ophth solution 1-2 drop  1-2 drop Both Eyes QID PRN Thurnell Lose, MD      . nitroGLYCERIN (NITROSTAT) SL tablet 0.4 mg  0.4 mg Sublingual Q5 min PRN [name], MD      . ondansetron (ZOFRAN) injection 4 mg  4 mg Intravenous Q6H PRN [name], MD       . oxyCODONE-acetaminophen (PERCOCET/ROXICET) 5-325 MG per tablet 1 tablet  1 tablet Oral Q3H PRN Thurnell Lose, MD   1 tablet at 01/24/18 1143  . pantoprazole (PROTONIX) EC tablet 40 mg  40 mg Oral Daily [name], MD   40 mg at 01/24/18 0904  . polyethylene glycol (MIRALAX / GLYCOLAX) packet 17 g  17 g Oral BID Thurnell Lose, MD   17 g at 01/24/18 0901  . rOPINIRole (REQUIP) tablet 1 mg  1 mg Oral QHS [name], MD   1 mg at 01/23/18 2158  . senna (SENOKOT) tablet 8.6  mg  1 tablet Oral BID [name], MD   8.6 mg at 01/24/18 0904  . sodium phosphate (FLEET) 7-19 GM/118ML enema 1 enema  1 enema Rectal Once PRN [name], MD      . vancomycin (VANCOCIN) 1,250 mg in sodium chloride 0.9 % 250 mL IVPB  1,250 mg Intravenous Q24H Romona Curls, RPH 166.7 mL/hr at 01/24/18 0700    . verapamil (CALAN-SR) CR tablet 240 mg  240 mg Oral Daily [name], MD   240 mg at 01/24/18 5087     Discharge Medications: Please see discharge summary for a list of discharge medications.  Relevant Imaging Results:  Relevant Lab Results:   Additional Information SS#: [id number]  Geralynn Ochs, LCSW

## 2018-01-24 NOTE — Plan of Care (Signed)
°  Problem: Coping: °Goal: Level of anxiety will decrease °Outcome: Progressing °  °

## 2018-01-24 NOTE — Progress Notes (Signed)
Patient ID: [name], female   DOB: 1940-11-22, 77 y.o.   MRN: [id number] Vital signs appear stable She is awake and alert and demonstrating improved neural function today She has some wrist extensor function on the right hand and grip strength appears to be improving on the left she is able to flex the hip and leg on the left side to raise it off the bed.  This is a substantial improvement Drain has minimal output Incision remains clean and dry.  I have discussed with nursing to remove drain on next patient turn and inspection.

## 2018-01-24 NOTE — Progress Notes (Signed)
Inpatient Rehabilitation Admissions Coordinator  I met with patient and her local daughter at bedside. We discussed goals and expectations of any rehab venue with inpt rehab snd SNF. Daughter is an OR Marine scientist here at [employer]. She is not available for 24/7 assist and would not take full FMLA. Her sister lives in [place] and also unable to be a caregiver. Daughter has tried to get her Mom to an ALF pta but to date Mom not in agreement. I discussed SNF rehab as our recommendation at this time and they are in agreement. I will discuss with RN CM and SW. We will sign off at this time. [employer] .  Danne Baxter, RN, MSN Rehab Admissions Coordinator [phone] 01/24/2018 11:23 AM

## 2018-01-24 NOTE — Progress Notes (Signed)
PROGRESS NOTE                                                                                                                                                                                                             Patient Demographics:    [name], is a 77 y.o. female, DOB - [birth year], ZTI:[id number]  Admit date - 01/21/2018   Admitting Physician [name], MD  Outpatient Primary MD for the patient is Leah, Malka So, MD  LOS - 2  Chief Complaint  Patient presents with  . Fall       Brief Narrative  [name] is a 77 y.o. female with history of SVT, hypertension, fibromyalgia, diabetes mellitus had a fall at home while she was trying to open her door.  She struck her head when she fell from a porch.  Denied losing consciousness, nochest pain or palpitations.  He came to the ER where a C-spine CT scan showed an acute C7 fracture, Dr. [name] neurosurgeon on-call was consulted by the ER physician who advised a c-collar which was placed, she was subsequently admitted to the hospital.  Early this morning patient requested that she be placed in the chair as she was not comfortable in the bed, she was placed in the chair with assistance, she did fine however after about 45 minutes of sitting up in the chair she reported that she is unable to move her legs further work-up showed that she had bled into her spinal cord space and she was urgently taken to the OR by neurosurgeon Dr. [name].    Subjective:   Patient in bed, appears comfortable, denies any headache, no fever, no chest pain or pressure, no shortness of breath , no abdominal pain. No focal weakness.  Mild neck pain, leg strength has improved a little, chronic right arm more than left arm weakness remains.   Assessment  & Plan :     1.  Mechanical fall, acute C7 fracture.  Complicated by spinal cord hematoma and edema causing cord compression.  Patient had chronic upper extremity weakness right  greater than left and has now developed lateral lower extremity weakness with some improvement since her decompression surgery right leg now 2/5 left leg 1/5.  She is in c-collar, she was emergently taken to the OR by Dr. [name] on 01/22/2018 and underwent Posterior decompression with laminectomy C6-C7, posterior fixation C4-T1 with T1 pedicle screws and C4-C7 facet screws bilaterally posterior lateral arthrodesis with local autograft  C6 and C7.  Continue supportive care, PT OT, definitely will require CIR/NF.  She had some perioperative blood loss and received 2 units of packed RBC in the OR with stable H&H thereafter.   2.  Multiple C-spine surgeries last CT 7 to T1 fusion in September of this year, multiple falls at home.  Had detailed discussion with patient's daughter bedside, patient has had several falls over the last several months and she had been recommended going to rehab multiple times but patient had been refusing it and been noncompliant with recommendations.  Now agreeable to going to an SNF or CIR, request made.   3.  Chronic bilateral upper extremity right more than left weakness.  Continue PT/OT, will require placement.  4.  History of SVT.  On metoprolol, verapamil and digoxin combination which will be continued.  5.  HX of restless leg syndrome along with peripheral neuropathy.  On Requip continue.  6.  Essential hypertension.  On combination of metoprolol and ACE inhibitor along with verapamil.  Continue and monitor.  7.  Chronic diastolic CHF EF 86% on echocardiogram done in November 2019.  Mildly elevated troponin and nonspecific non-ACS pattern.  No chest pain, no shortness of breath, EKG nonacute.  Continue combination of aspirin and beta-blocker for secondary prevention and monitor.  No acute issues.   8.  GERD.  On PPI.   9.  UTI.  Placed on Keflex will monitor.  Stop date 01/25/2018.  10.  Hyponatremia.  This was SIADH improved with fluid restriction.   11. DM type  II.  On Lantus twice daily along with sliding scale will monitor.  CBGs stable for now.  CBG (last 3)  Recent Labs    01/23/18 1203 01/23/18 1641 01/23/18 2201  GLUCAP 160* 135* 131*      Family Communication  :  Daughter bedside  Code Status :  Full  Disposition Plan  :  TBD  Consults  :  N.Surg  Procedures  :    01/22/18 by Dr [name] - Posterior decompression with laminectomy C6-C7, posterior fixation C4-T1 with T1 pedicle screws and C4-C7 facet screws bilaterally posterior lateral arthrodesis with local autograft C6 and C7.  CT C spine - Acute fracture of the left side of the C7 vertebral body extends into the left facet and base of the left transverse process. No other acute abnormality is identified. 0.4 cm anterolisthesis C7 on T1 is unchanged. Status post C4-6 and C7-T1 fusion.  MRI C Spine - 1. C6-7 spinal cord edema with focal dorsal intramedullary versus extramedullary/epidural hemorrhage posteriorly, favor the latter. 2. Extensive edema and hemorrhage throughout the posterior neck soft tissues. Possible posterior ligamentous complex injury at C6-7. 3. C7 vertebral and posterior element fracture better demonstrated on CT. 4. Noncompressive dorsal epidural hematoma throughout the cervical and visualized upper thoracic spine. These results were called by telephone at the time of interpretation on 01/22/2018 at 3:13 pm to Dr. [name], who verbally acknowledged these results.  TTE Nov 2019 -   Left ventricle: The cavity size was normal. Systolic function was  normal. The estimated ejection fraction was in the range of 60% to 65%. Wall motion was normal; there were no regional wall motion abnormalities. Left ventricular diastolic function parameters were normal for the patient&'s age. - Aortic valve: Trileaflet; mildly thickened, mildly calcified leaflets. Left coronary cusp predominanly affected by calcification. Sclerosis without stenosis. There was no   regurgitation. -  Mitral valve: There was no significant regurgitation. - Right  ventricle: Systolic function was normal. - Atrial septum: No defect or patent foramen ovale was identified. - Tricuspid valve: There was no significant regurgitation. - Pulmonic valve: There was no significant regurgitation.   DVT Prophylaxis  : SCDs   Lab Results  Component Value Date   PLT 159 01/24/2018    Diet :  Diet Order            DIET SOFT Room service appropriate? Yes; Fluid consistency: Thin  Diet effective now               Inpatient Medications Scheduled Meds: . bisacodyl  10 mg Oral Daily  . cephALEXin  500 mg Oral Q12H  . digoxin  0.125 mg Oral Daily  . docusate sodium  100 mg Oral BID  . insulin aspart  0-9 Units Subcutaneous TID WC  . insulin glargine  25 Units Subcutaneous BID  . metoprolol succinate  25 mg Oral Daily  . pantoprazole  40 mg Oral Daily  . polyethylene glycol  17 g Oral BID  . rOPINIRole  1 mg Oral QHS  . senna  1 tablet Oral BID  . verapamil  240 mg Oral Daily   Continuous Infusions: . lactated ringers    . methocarbamol (ROBAXIN) IV 500 mg (01/23/18 1051)  . vancomycin 166.7 mL/hr at 01/24/18 0700   PRN Meds:.acetaminophen **OR** acetaminophen, alum & mag hydroxide-simeth, bisacodyl, diltiazem, diphenhydrAMINE, hydrALAZINE, LORazepam, methocarbamol **OR** methocarbamol (ROBAXIN) IV, metoprolol tartrate, morphine injection, naphazoline-glycerin, nitroGLYCERIN, [DISCONTINUED] ondansetron **OR** ondansetron (ZOFRAN) IV, oxyCODONE-acetaminophen, sodium phosphate  Antibiotics  :   Anti-infectives (From admission, onward)   Start     Dose/Rate Route Frequency Ordered Stop   01/24/18 0600  vancomycin (VANCOCIN) 1,250 mg in sodium chloride 0.9 % 250 mL IVPB     1,250 mg 166.7 mL/hr over 90 Minutes Intravenous Every 24 hours 01/23/18 1056     01/23/18 0600  vancomycin (VANCOCIN) 1,750 mg in sodium chloride 0.9 % 500 mL IVPB  Status:  Discontinued     1,750 mg 250 mL/hr over  120 Minutes Intravenous Every 24 hours 01/23/18 0012 01/23/18 1056   01/22/18 2020  bacitracin 50,000 Units in sodium chloride 0.9 % 500 mL irrigation  Status:  Discontinued       As needed 01/22/18 2021 01/22/18 2216   01/22/18 1800  cephALEXin (KEFLEX) capsule 500 mg     500 mg Oral Every 12 hours 01/22/18 0945 01/25/18 0939   01/22/18 0600  ciprofloxacin (CIPRO) IVPB 400 mg  Status:  Discontinued     400 mg 200 mL/hr over 60 Minutes Intravenous Every 12 hours 01/21/18 2237 01/22/18 0906   01/21/18 1845  ciprofloxacin (CIPRO) IVPB 400 mg     400 mg 200 mL/hr over 60 Minutes Intravenous  Once 01/21/18 1832 01/21/18 2033          Objective:   Vitals:   01/24/18 0600 01/24/18 0700 01/24/18 0800 01/24/18 0900  BP: (!) 141/55 (!) 134/52 (!) 148/61 (!) 120/45  Pulse: 64 65 63 73  Resp: 13 14 15 15   Temp:   98.8 F (37.1 C)   TempSrc:   Oral   SpO2: 96% 98% 97% 97%  Weight:      Height:        Wt Readings from Last 3 Encounters:  01/22/18 94.3 kg  12/28/17 94.3 kg  12/21/17 94.3 kg     Intake/Output Summary (Last 24 hours) at 01/24/2018 1005 Last data filed at 01/24/2018 0900 Gross per 24  hour  Intake 1784.99 ml  Output 3539 ml  Net -1754.01 ml     Physical Exam  Awake Alert, Oriented X 3, wearing c-collar, chronic upper extremity weakness with right more than left, lower extremity right leg 2/5 left leg 1/5 some improvement [place].AT,PERRAL Supple Neck,No JVD, No cervical lymphadenopathy appriciated.  Symmetrical Chest wall movement, Good air movement bilaterally, CTAB RRR,No Gallops, Rubs or new Murmurs, No Parasternal Heave +ve B.Sounds, Abd Soft, No tenderness, No organomegaly appriciated, No rebound - guarding or rigidity. No Cyanosis, Clubbing or edema, No new Rash or bruise    Data Review:    CBC Recent Labs  Lab 01/21/18 1612 01/22/18 0042 01/23/18 0327 01/24/18 0335  WBC 19.0* 10.2 11.7* 8.8  HGB 13.8 12.8 10.4* 9.9*  HCT 42.6 38.8 31.5* 31.5*    PLT 175 181 164 159  MCV 87.3 85.7 86.3 89.2  MCH 28.3 28.3 28.5 28.0  MCHC 32.4 33.0 33.0 31.4  RDW 13.1 13.2 13.2 13.6  LYMPHSABS 1.1  --   --   --   MONOABS 0.9  --   --   --   EOSABS 0.0  --   --   --   BASOSABS 0.1  --   --   --     Chemistries  Recent Labs  Lab 01/21/18 1612 01/22/18 0042 01/23/18 0327 01/24/18 0335  NA 136 133* 130* 134*  K 4.1 4.4 4.4 4.2  CL 102 98 98 102  CO2 21* 22 24 24   GLUCOSE 204* 189* 189* 112*  BUN 22 19 20 19   CREATININE 0.84 0.84 0.92 0.81  CALCIUM 9.6 9.1 8.4* 8.3*  MG  --  1.6*  --   --    ------------------------------------------------------------------------------------------------------------------ No results for input(s): CHOL, HDL, LDLCALC, TRIG, CHOLHDL, LDLDIRECT in the last 72 hours.  Lab Results  Component Value Date   HGBA1C 7.6 (H) 11/12/2017   ------------------------------------------------------------------------------------------------------------------ No results for input(s): TSH, T4TOTAL, T3FREE, THYROIDAB in the last 72 hours.  Invalid input(s): FREET3 ------------------------------------------------------------------------------------------------------------------ No results for input(s): VITAMINB12, FOLATE, FERRITIN, TIBC, IRON, RETICCTPCT in the last 72 hours.  Coagulation profile Recent Labs  Lab 01/21/18 1612  INR 0.94    No results for input(s): DDIMER in the last 72 hours.  Cardiac Enzymes Recent Labs  Lab 01/22/18 0042 01/22/18 0645 01/22/18 1114  TROPONINI 0.59* 0.54* 0.36*   ------------------------------------------------------------------------------------------------------------------    Component Value Date/Time   BNP 10.6 02/14/2015 0341    Micro Results Recent Results (from the past 240 hour(s))  Surgical pcr screen     Status: None   Collection Time: 01/22/18  5:48 PM  Result Value Ref Range Status   MRSA, PCR NEGATIVE NEGATIVE Final   Staphylococcus aureus NEGATIVE  NEGATIVE Final    Comment: (NOTE) The Xpert SA Assay (FDA approved for NASAL specimens in patients 83 years of age and older), is one component of a comprehensive surveillance program. It is not intended to diagnose infection nor to guide or monitor treatment. Performed at [facility], [place] [address], [place], [place] [address]     Radiology Reports Dg Cervical Spine 2-3 Views  Result Date: 01/22/2018 CLINICAL DATA:  Cervical spine decompression and fusion. EXAM: CERVICAL SPINE - 2-3 VIEW; DG C-ARM 61-120 MIN COMPARISON:  None. FINDINGS: Two C-arm views of the cervical spine demonstrate pedicle screw and rod an plate and screw fusion at multiple levels. The bone details are not adequately visualized to determine the levels involved. IMPRESSION: Operative changes, as described  above. Electronically Signed   By: Claudie Revering M.D.   On: 01/22/2018 22:00   Dg Shoulder Right  Result Date: 01/21/2018 CLINICAL DATA:  Fall EXAM: RIGHT SHOULDER - 2+ VIEW COMPARISON:  07/19/2011 FINDINGS: Negative for fracture or dislocation. Mild degenerative change in the [facility] joint and shoulder joint. Mild associated spurring. IMPRESSION: Negative for fracture or dislocation. Electronically Signed   By: Franchot Gallo M.D.   On: 01/21/2018 14:59   Ct Head Wo Contrast  Result Date: 01/21/2018 CLINICAL DATA:  Patient status post C7-T1 fusion 10/24/2017. History of prior C4-6 fusion. Status post fall off a porch today. Limited range of motion of the head and new tingling in the left fingers since the fall. Initial encounter. EXAM: CT HEAD WITHOUT CONTRAST CT CERVICAL SPINE WITHOUT CONTRAST TECHNIQUE: Multidetector CT imaging of the head and cervical spine was performed following the standard protocol without intravenous contrast. Multiplanar CT image reconstructions of the cervical spine were also generated. COMPARISON:  Brain MRI 01/12/2018. Cervical spine CT scan 12/03/2017. Head CT 09/21/2017. FINDINGS: CT HEAD  FINDINGS Brain: No evidence of acute infarction, hemorrhage, hydrocephalus, extra-axial collection or mass lesion/mass effect. Vascular: No hyperdense vessel or unexpected calcification. Skull: Intact.  No focal lesion. Sinuses/Orbits: Small mucous retention cyst or polyp right maxillary sinus is unchanged. The patient is status post cataract surgery bilaterally. Other: None. CT CERVICAL SPINE FINDINGS Alignment: Approximately 0.4 cm anterolisthesis C7 on T1 is unchanged since the prior CT. Alignment is otherwise maintained. Skull base and vertebrae: The patient has an acute fracture of C7. The fracture extends from the left side of the vertebral body into the left C7 facet and base of the left transverse process. The fracture is minimally displaced at 1-2 mm. No other fracture is identified. The interbody spacer and C7 appears to be incorporating into the endplates. Soft tissues and spinal canal: No epidural hematoma is identified. Disc levels: Loss of disc space height and endplate spurring at L9-3 appear unchanged. Upper chest: Lung apices are clear. Other: None. IMPRESSION: Acute fracture of the left side of the C7 vertebral body extends into the left facet and base of the left transverse process. No other acute abnormality is identified. 0.4 cm anterolisthesis C7 on T1 is unchanged. Status post C4-6 and C7-T1 fusion. No acute intracranial normality. Electronically Signed   By: Inge Rise M.D.   On: 01/21/2018 15:32   Ct Cervical Spine Wo Contrast  Result Date: 01/21/2018 CLINICAL DATA:  Patient status post C7-T1 fusion 10/24/2017. History of prior C4-6 fusion. Status post fall off a porch today. Limited range of motion of the head and new tingling in the left fingers since the fall. Initial encounter. EXAM: CT HEAD WITHOUT CONTRAST CT CERVICAL SPINE WITHOUT CONTRAST TECHNIQUE: Multidetector CT imaging of the head and cervical spine was performed following the standard protocol without intravenous  contrast. Multiplanar CT image reconstructions of the cervical spine were also generated. COMPARISON:  Brain MRI 01/12/2018. Cervical spine CT scan 12/03/2017. Head CT 09/21/2017. FINDINGS: CT HEAD FINDINGS Brain: No evidence of acute infarction, hemorrhage, hydrocephalus, extra-axial collection or mass lesion/mass effect. Vascular: No hyperdense vessel or unexpected calcification. Skull: Intact.  No focal lesion. Sinuses/Orbits: Small mucous retention cyst or polyp right maxillary sinus is unchanged. The patient is status post cataract surgery bilaterally. Other: None. CT CERVICAL SPINE FINDINGS Alignment: Approximately 0.4 cm anterolisthesis C7 on T1 is unchanged since the prior CT. Alignment is otherwise maintained. Skull base and vertebrae: The patient has an acute fracture of  C7. The fracture extends from the left side of the vertebral body into the left C7 facet and base of the left transverse process. The fracture is minimally displaced at 1-2 mm. No other fracture is identified. The interbody spacer and C7 appears to be incorporating into the endplates. Soft tissues and spinal canal: No epidural hematoma is identified. Disc levels: Loss of disc space height and endplate spurring at R9-7 appear unchanged. Upper chest: Lung apices are clear. Other: None. IMPRESSION: Acute fracture of the left side of the C7 vertebral body extends into the left facet and base of the left transverse process. No other acute abnormality is identified. 0.4 cm anterolisthesis C7 on T1 is unchanged. Status post C4-6 and C7-T1 fusion. No acute intracranial normality. Electronically Signed   By: Inge Rise M.D.   On: 01/21/2018 15:32   Mr Jodene Nam Head Wo Contrast  Result Date: 01/13/2018  [facility] [address] [place], [place] [address] [phone] NEUROIMAGING REPORT STUDY DATE: 01/12/2018 PATIENT NAME: KLEE KOLEK DOB: [birth year] MRN: [id number] EXAM: MR angiogram of the intracranial arteries  ORDERING CLINICIAN: Andrey Spearman MD CLINICAL HISTORY: 77 year old woman with vertigo and vertebrobasilar TIAs COMPARISON FILMS: None TECHNIQUE: MR angiogram of the head was obtained utilizing 3D time of flight sequences from below the vertebrobasilar junction up to the intracranial vasculature without contrast.  Computerized reconstructions were obtained. CONTRAST: None IMAGING SITE: [facility], [place], [place], [place] FINDINGS: The imaged extracranial and intracranial portions of the internal carotid arteries appear normal. The middle cerebral appear normal.  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   In the posterior circulation, the left vertebral artery is mildly dominant.  No stenosis is noted within the vertebral arteries and the basilar arteries.  Posterior communicating arteries are noted bilaterally, right greater than left.  The right posterior artery derives most of its flow from the anterior circulation through the right posterior communicating artery.  The posterior cerebral arteries do not appear to have stenosis.. No aneurysms were identified.   This MR angiogram of the intracranial arteries shows the following: 1.    Normal variant anatomy with an absent A1 segment of the right anterior cerebral artery.  There is a fetal origin of the right posterior cerebral artery. 2.    No significant arterial stenosis.  No aneurysms.  INTERPRETING PHYSICIAN: Richard A. Felecia Shelling, MD, PhD, FAAN Certified in  Neuroimaging by [employer]   Mr Jodene Nam Neck W Wo Contrast  Result Date: 01/13/2018  [facility] [address] [place], [place] [address] [phone] NEUROIMAGING REPORT STUDY DATE: 01/12/2018 PATIENT NAME: TAHARA RUFFINI DOB: [birth year] MRN: [id number] EXAM: MR Angiogram of the Neck Arteries with and without contrast  ORDERING CLINICIAN: Andrey Spearman MD CLINICAL HISTORY: 77 year old woman with vertebrobasilar TIAs COMPARISON FILMS: None TECHNIQUE: MR angiogram of the neck was obtained utilizing 3D time-of-flight sequences from the aortic arch up to the intracranial vasculature postbolus contrast infusion.  2D time-of-flight noncontrast views and computerized reconstructions were obtained. CONTRAST: 19 mL MultiHance IMAGING SITE: [facility], [place], [place], [place] FINDINGS: This study is of adequate technical quality.  There is anterograde flow in the bilateral vertebral and carotid arteries on 2D-TOF views.  The flow signal of the left subclavian artery has no stenosis.  The left common and external carotid arteries have no stenosis.  There  is minimal (10 to 15%) stenosis at the origin of the left internal carotid artery.  This is hemodynamically significant.  The left vertebral artery is mildly smaller than the right.  The origin appears normal.  There is reduced flow signal in the proximal cervical segment that is most likely artifactual.  The more distal left vertebral artery has no stenosis up to the vertebrobasilar junction.  On the right, the brachiocephalic trunk and subclavian arteries have no stenosis. The right common, internal and external carotid arteries have no stenosis. The right vertebral artery has no stenosis from its origin to the vertebrobasilar junction. Limited views of the intracranial vasculature are unremarkable.   This MR angiogram of the neck arteries shows the following: 1.    Although there is reduced flow signal in the proximal cervical segment of the left vertebral artery on the source and reconstructed images, this is most likely artifactual as there is completely normal flow more distally. 2.    Minimal (less than 15%) stenosis at the origin of the left internal carotid artery.  This is not hemodynamically significant.  3.    Other arteries appear normal. INTERPRETING  PHYSICIAN: Richard A. Felecia Shelling, MD, PhD, FAAN Certified in  Neuroimaging by [employer]   Mr Cervical Spine Wo Contrast  Result Date: 01/22/2018 CLINICAL DATA:  Fall with left-sided vertebral body and posterior element fractures at C7. Prior cervical fusion. Sudden lower extremity weakness. EXAM: MRI CERVICAL SPINE WITHOUT CONTRAST TECHNIQUE: Multiplanar, multisequence MR imaging of the cervical spine was performed. No intravenous contrast was administered. COMPARISON:  Cervical spine CT 01/21/2018 and MRI 08/07/2017 FINDINGS: Alignment: Grade 1 anterolisthesis of C6 on C7 and C7 on T1, unchanged. Vertebrae: Bilateral C7 facet edema with a left-sided fracture shown on yesterday's CT. Fracture component involving the C7 vertebral body not well demonstrated by MRI with evaluation limited by artifact from C4-T1 anterior fusion plates and screws. Cord: T2 hyperintensity in the cord at C6-7 with cord expansion consistent with edema. Suspected focal hemorrhage along the posterior margin of the cord at C7 (series 8, image 26), uncertain if this is intramedullary or extramedullary in location. Noncompressive dorsal epidural hematoma throughout the cervical and included upper thoracic spine. Posterior Fossa, vertebral arteries, paraspinal tissues: Extensive edema and hematoma throughout the musculature and other soft tissues of the posterior neck bilaterally. STIR hyperintensity in the interspinous soft tissues at C6-7 with possible disruption of the ligamentum flavum. Disc levels: C2-3: Mild facet arthrosis and tiny central disc protrusion without stenosis, unchanged from the prior MRI. C3-4: Broad-based posterior disc osteophyte complex and minimal facet arthrosis result in mild spinal stenosis and moderate to severe right and moderate left neural foraminal stenosis, unchanged from the prior MRI. C4-5: Prior ACDF.  No significant stenosis. C5-6: Prior ACDF.  No significant stenosis. C6-7: Prior ACDF.  Slight anterolisthesis with epidural hematoma. Mild spinal stenosis. C7-T1: ACDF. Anterolisthesis and facet arthrosis result in moderate bilateral neural foraminal stenosis. Epidural hematoma with mild spinal stenosis. T1-2: Small right central disc protrusion, unchanged from the prior MRI and slightly deforming the ventral spinal cord. Unchanged mild left neural foraminal stenosis. T2-3: Mild disc bulging with borderline bilateral neural foraminal stenosis and no spinal stenosis. T3-4: Small central disc extrusion mildly deforming the ventral spinal cord, similar to the prior MRI. T5-6 and T6-7: Only imaged sagittally. Small central disc herniations at both levels contacting and at most slightly deforming the spinal cord. IMPRESSION: 1. C6-7 spinal cord edema with focal dorsal intramedullary versus extramedullary/epidural  hemorrhage posteriorly, favor the latter. 2. Extensive edema and hemorrhage throughout the posterior neck soft tissues. Possible posterior ligamentous complex injury at C6-7. 3. C7 vertebral and posterior element fracture better demonstrated on CT. 4. Noncompressive dorsal epidural hematoma throughout the cervical and visualized upper thoracic spine. These results were called by telephone at the time of interpretation on 01/22/2018 at 3:13 pm to Dr. [name], who verbally acknowledged these results. Electronically Signed   By: Logan Bores M.D.   On: 01/22/2018 15:49   Dg Shoulder Left  Result Date: 01/21/2018 CLINICAL DATA:  [address] down stairs EXAM: LEFT SHOULDER - 2+ VIEW COMPARISON:  None. FINDINGS: There is no evidence of fracture or dislocation. There is no evidence of arthropathy or other focal bone abnormality. Soft tissues are unremarkable. IMPRESSION: Negative. Electronically Signed   By: Franchot Gallo M.D.   On: 01/21/2018 15:00   Dg C-arm 1-60 Min  Result Date: 01/22/2018 CLINICAL DATA:  Cervical spine decompression and fusion. EXAM: CERVICAL SPINE - 2-3 VIEW; DG C-ARM  61-120 MIN COMPARISON:  None. FINDINGS: Two C-arm views of the cervical spine demonstrate pedicle screw and rod an plate and screw fusion at multiple levels. The bone details are not adequately visualized to determine the levels involved. IMPRESSION: Operative changes, as described above. Electronically Signed   By: Claudie Revering M.D.   On: 01/22/2018 22:00   Mr Brain/iac W Wo Contrast  Result Date: 01/13/2018  [facility] [address], [place], [place] [address] [phone] NEUROIMAGING REPORT STUDY DATE: 01/12/2018 PATIENT NAME: LATAJA NEWLAND DOB: 05-29-1940 MRN: [id number] EXAM: MRI Brain with and without contrast ORDERING CLINICIAN: Andrey Spearman MD CLINICAL HISTORY: 77 year old woman with vertigo COMPARISON FILMS: None TECHNIQUE:MRI of the brain with and without contrast was obtained utilizing 5 mm axial slices with T1, T2, T2 flair, SWI and diffusion weighted views.  T1 sagittal and postcontrast views in the axial and coronal plane were obtained.  Additional thin section pre-and post contrasted axial and coronal T1-weighted and axial  CISS images through the internal auditory canals were performed. CONTRAST: 19 ml [employer] IMAGING SITE: [facility], [place]. FINDINGS: On sagittal images, the spinal cord is imaged caudally to C3 and is normal in caliber.   The contents of the posterior fossa are of normal size and position.   The pituitary gland and optic chiasm appear normal.   The third and lateral ventricles are mildly enlarged, in proportion to the extent of mild generalized cortical atrophy.  There are no abnormal extra-axial collections of fluid.  There are T2/FLAIR hyperintense foci in the pons.  The cerebellum and deep gray matter appears normal.  In the hemispheres, there are some scattered T2/FLAIR hyperintense foci in the subcortical and deep white matter.  None of the foci appears to be acute.   Diffusion weighted images are normal.   Susceptibility weighted images are normal.   The VIIth/VIIIth nerve complexes appear normal before and after contrast.  There have been bilateral lens replacements.  The orbits are otherwise normal.  The mastoid air cells appear normal.  The paranasal sinuses appear normal.  Flow voids are identified within the major intracerebral arteries.  After the infusion of contrast material, a normal enhancement pattern is noted.   This MRI of the brain with and without contrast with added attention to the internal auditory canal shows the following: 1.     T2/FLAIR hypertense foci in the pons and hemispheres consistent with chronic microvascular ischemic change.  None of the foci appears to be acute. 2.     Mild generalized cortical atrophy. 3.     The internal auditory canals appear normal before and after contrast. INTERPRETING PHYSICIAN: Richard A. Felecia Shelling, MD, PhD, FAAN Certified in  Neuroimaging by [employer]    Time Spent in minutes  30   Lala Lund M.D on 01/24/2018 at 10:05 AM  To page go to [email or web address] - password [email or web address]

## 2018-01-25 ENCOUNTER — Telehealth: Payer: Self-pay

## 2018-01-25 DIAGNOSIS — I10 Essential (primary) hypertension: Secondary | ICD-10-CM

## 2018-01-25 LAB — GLUCOSE, CAPILLARY
Glucose-Capillary: 145 mg/dL — ABNORMAL HIGH (ref 70–99)
Glucose-Capillary: 104 mg/dL — ABNORMAL HIGH (ref 70–99)
Glucose-Capillary: 104 mg/dL — ABNORMAL HIGH (ref 70–99)
Glucose-Capillary: 159 mg/dL — ABNORMAL HIGH (ref 70–99)

## 2018-01-25 LAB — BASIC METABOLIC PANEL
Anion gap: 10 (ref 5–15)
BUN: 16 mg/dL (ref 8–23)
CO2: 24 mmol/L (ref 22–32)
Calcium: 8.5 mg/dL — ABNORMAL LOW (ref 8.9–10.3)
Chloride: 100 mmol/L (ref 98–111)
Creatinine, Ser: 0.86 mg/dL (ref 0.44–1.00)
GFR calc Af Amer: >60 mL/min (ref >60)
GFR calc non Af Amer: >60 mL/min (ref >60)
Glucose, Bld: 123 mg/dL — ABNORMAL HIGH (ref 70–99)
Potassium: 4.2 mmol/L (ref 3.5–5.1)
Sodium: 134 mmol/L — ABNORMAL LOW (ref 135–145)

## 2018-01-25 LAB — CBC
HCT: 32 % — ABNORMAL LOW (ref 36.0–46.0)
Hemoglobin: 10.4 g/dL — ABNORMAL LOW (ref 12.0–15.0)
MCH: 28.4 pg (ref 26.0–34.0)
MCHC: 32.5 g/dL (ref 30.0–36.0)
MCV: 87.4 fL (ref 80.0–100.0)
Platelets: 167 10*3/uL (ref 150–400)
RBC: 3.66 MIL/uL — ABNORMAL LOW (ref 3.87–5.11)
RDW: 13.4 % (ref 11.5–15.5)
WBC: 9.1 10*3/uL (ref 4.0–10.5)
nRBC: 0 % (ref 0.0–0.2)

## 2018-01-25 LAB — MAGNESIUM: Magnesium: 2.1 mg/dL (ref 1.7–2.4)

## 2018-01-25 MED ORDER — TAMSULOSIN HCL 0.4 MG PO CAPS
0.4000 mg | ORAL_CAPSULE | Freq: Every day | ORAL | Status: DC
  Administered 2018-01-25 – 2018-01-29 (×5): 0.4 mg via ORAL
  Filled 2018-01-25 (×5): qty 1

## 2018-01-25 MED ORDER — MAGNESIUM HYDROXIDE 400 MG/5ML PO SUSP
30.0000 mL | Freq: Two times a day (BID) | ORAL | Status: AC
  Administered 2018-01-25 (×2): 30 mL via ORAL
  Filled 2018-01-25 (×2): qty 30

## 2018-01-25 MED ORDER — BISACODYL 10 MG RE SUPP
10.0000 mg | Freq: Once | RECTAL | Status: AC
  Administered 2018-01-25: 10 mg via RECTAL
  Filled 2018-01-25: qty 1

## 2018-01-25 MED ORDER — SALINE SPRAY 0.65 % NA SOLN
1.0000 | NASAL | Status: DC | PRN
  Administered 2018-01-25: 1 via NASAL
  Filled 2018-01-25: qty 44

## 2018-01-25 MED ORDER — METOPROLOL SUCCINATE ER 25 MG PO TB24: 25.0000 mg | ORAL_TABLET | Freq: Every day | ORAL | 0 refills | Status: DC

## 2018-01-25 NOTE — Progress Notes (Signed)
Patient ID: [name], female   DOB: [birth year], 77 y.o.   MRN: [id number] Vital signs are stable Patient has been transferred to the floor Neurologic exam remains essentially unchanged with trace wrist extensor on the right mild grip on the left flicker of movement in both lower extremities proximally. We will likely require skilled nursing facility for rehabilitation which will be long-term Discussed this with the patient's daughter Continue supportive care

## 2018-01-25 NOTE — Clinical Social Work Note (Signed)
Clinical Social Work Assessment  Patient Details  Name: [name] MRN: [id number] Date of Birth: 01/17/1941  Date of referral:  01/25/18               Reason for consult:  Facility Placement                Permission sought to share information with:  Facility Sport and exercise psychologist, Family Supports Permission granted to share information::  Yes, Verbal Permission Granted  Name::     Therapist, occupational::  SNFs  Relationship::  Daughter  Contact Information:  [phone]  Housing/Transportation Living arrangements for the past 2 months:  [place] of Information:  Patient, Adult Children Patient Interpreter Needed:  None Criminal Activity/Legal Involvement Pertinent to Current Situation/Hospitalization:  No - Comment as needed Significant Relationships:  Adult Children Lives with:  Self Do you feel safe going back to the place where you live?  No Need for family participation in patient care:  Yes (Comment)  Care giving concerns:  CSW received consult for possible SNF placement at time of discharge. CSW spoke with patient/daughter regarding PT recommendation of SNF placement at time of discharge. Patient's daughter reported that patient's family is currently unable to care for patient at home given patient's current physical needs and fall risk. Patient's daughter expressed understanding of PT recommendation and is agreeable to SNF placement at time of discharge. CSW to continue to follow and assist with discharge planning needs.   Social Worker assessment / plan:  CSW spoke with patient/daughter concerning possibility of rehab at [facility] before returning home.  Employment status:  Retired Nurse, adult PT Recommendations:  [place] / Referral to community resources:  [place]  Patient/Family's Response to care:  Patient recognizes need for rehab before returning home and is agreeable to a SNF in.  Patient/daughter reported preference for [facility]. [name] will also consult her sister on a second option. CSW explained insurance process.   Patient/Family's Understanding of and Emotional Response to Diagnosis, Current Treatment, and Prognosis:  Patient/family is realistic regarding therapy needs and expressed being hopeful for SNF placement. Patient/daughter expressed understanding of CSW role and discharge process as well as medical condition. No questions/concerns about plan or treatment.    Emotional Assessment Appearance:  Appears stated age Attitude/Demeanor/Rapport:  Engaged Affect (typically observed):  Accepting, Appropriate Orientation:  Oriented to Self, Oriented to Place, Oriented to Situation, Oriented to  Time Alcohol / Substance use:  Not Applicable Psych involvement (Current and /or in the community):  No (Comment)  Discharge Needs  Concerns to be addressed:  Care Coordination Readmission within the last 30 days:  No Current discharge risk:  None Barriers to Discharge:  Continued Medical Work up   [employer], LCSW 01/25/2018, 2:32 PM

## 2018-01-25 NOTE — Telephone Encounter (Signed)
Refill request faxed from pharmacy for Dr. Wynonia Lawman patient on Metoprolol Succinate 25 mg. 90 day refill has been sent to pharmacy per Dr. Agustin Cree. Appointment needed for further refills.

## 2018-01-25 NOTE — Progress Notes (Signed)
Pt transferred to 5 W 31, bedside handoff done with Jonelle Sidle RN, daughter at bedside. Pts phone and glasses with daughter.

## 2018-01-25 NOTE — Progress Notes (Signed)
PROGRESS NOTE                                                                                                                                                                                                             Patient Demographics:    [name], is a 77 y.o. female, DOB - [birth year], GYF:[id number]  Admit date - 01/21/2018   Admitting Physician [name], MD  Outpatient Primary MD for the patient is [name], Malka So, MD  LOS - 3  Chief Complaint  Patient presents with  . Fall       Brief Narrative  [name] is a 77 y.o. female with history of SVT, hypertension, fibromyalgia, diabetes mellitus had a fall at home while she was trying to open her door.  She struck her head when she fell from a porch.  Denied losing consciousness, nochest pain or palpitations.  He came to the ER where a C-spine CT scan showed an acute C7 fracture, Dr. [name] neurosurgeon on-call was consulted by the ER physician who advised a c-collar which was placed, she was subsequently admitted to the hospital.  Early this morning patient requested that she be placed in the chair as she was not comfortable in the bed, she was placed in the chair with assistance, she did fine however after about 45 minutes of sitting up in the chair she reported that she is unable to move her legs further work-up showed that she had bled into her spinal cord space and she was urgently taken to the OR by neurosurgeon Dr. [name].    Subjective:   Patient in bed, appears comfortable, denies any headache, no fever, no chest pain or pressure, no shortness of breath , no abdominal pain. No focal weakness.  Leg weakness has improved slightly.   Assessment  & Plan :     1.  Mechanical fall, acute C7 fracture.  Complicated by spinal cord hematoma and edema causing cord compression.  Patient had chronic upper extremity weakness right greater than left and has now developed lateral lower extremity weakness  with some improvement since her decompression surgery right leg now 2/5 left leg 1/5.  She is in c-collar, she was emergently taken to the OR by Dr. [name] on 01/22/2018 and underwent Posterior decompression with laminectomy C6-C7, posterior fixation C4-T1 with T1 pedicle screws and C4-C7 facet screws bilaterally posterior lateral arthrodesis with local autograft C6 and C7.  She required 2 units of packed RBC during the  surgery in the OR thereafter H&H is stable, has tolerated the procedure well, continue PT OT, lower extremity strength minimally improved, will require SNF.  2.  Multiple C-spine surgeries last CT 7 to T1 fusion in September of this year, multiple falls at home.  Had detailed discussion with patient's daughter bedside, patient has had several falls over the last several months and she had been recommended going to rehab multiple times but patient had been refusing it and been noncompliant with recommendations.  Now agreeable to going to an SNF or CIR, request made.   3.  Chronic bilateral upper extremity right more than left weakness.  Continue PT/OT, will require placement.  4.  History of SVT.  On metoprolol, verapamil and digoxin combination which will be continued.  5.  HX of restless leg syndrome along with peripheral neuropathy.  On Requip continue.  6.  Essential hypertension.  On combination of metoprolol and ACE inhibitor along with verapamil.  Continue and monitor.  7.  Chronic diastolic CHF EF 95% on echocardiogram done in November 2019.  Mildly elevated troponin and nonspecific non-ACS pattern.  No chest pain, no shortness of breath, EKG nonacute.  Continue combination of aspirin and beta-blocker for secondary prevention and monitor.  No acute issues.   8.  GERD.  On PPI.   9.  UTI.  Placed on Keflex will monitor.  Stop date 01/25/2018.  10.  Hyponatremia.  This was SIADH improved with fluid restriction.   11. Ur.Retention and Constipation -based on aggressive bowel  regimen, will give an additional suppository today as well, failed Foley removal, Foley reinserted on 01/25/2018, Flomax started.  Will monitor.    12.  Hypomagnesemia.  Replaced and stable.    13. DM type II.  On Lantus twice daily along with sliding scale will monitor.  CBGs stable for now.  CBG (last 3)  Recent Labs    01/23/18 2201 01/24/18 1142 01/24/18 1631  GLUCAP 131* 127* 140*      Family Communication  :  Daughter bedside  Code Status :  Full  Disposition Plan  :  TBD  Consults  :  N.Surg  Procedures  :    01/22/18 by Dr [name] - Posterior decompression with laminectomy C6-C7, posterior fixation C4-T1 with T1 pedicle screws and C4-C7 facet screws bilaterally posterior lateral arthrodesis with local autograft C6 and C7.  CT C spine - Acute fracture of the left side of the C7 vertebral body extends into the left facet and base of the left transverse process. No other acute abnormality is identified. 0.4 cm anterolisthesis C7 on T1 is unchanged. Status post C4-6 and C7-T1 fusion.  MRI C Spine - 1. C6-7 spinal cord edema with focal dorsal intramedullary versus extramedullary/epidural hemorrhage posteriorly, favor the latter. 2. Extensive edema and hemorrhage throughout the posterior neck soft tissues. Possible posterior ligamentous complex injury at C6-7. 3. C7 vertebral and posterior element fracture better demonstrated on CT. 4. Noncompressive dorsal epidural hematoma throughout the cervical and visualized upper thoracic spine. These results were called by telephone at the time of interpretation on 01/22/2018 at 3:13 pm to Dr. [name], who verbally acknowledged these results.  TTE Nov 2019 -   Left ventricle: The cavity size was normal. Systolic function was  normal. The estimated ejection fraction was in the range of 60% to 65%. Wall motion was normal; there were no regional wall motion abnormalities. Left ventricular diastolic function parameters were normal for the  patient&'s age. - Aortic valve: Trileaflet;  mildly thickened, mildly calcified leaflets. Left coronary cusp predominanly affected by calcification. Sclerosis without stenosis. There was no   regurgitation. - Mitral valve: There was no significant regurgitation. - Right ventricle: Systolic function was normal. - Atrial septum: No defect or patent foramen ovale was identified. - Tricuspid valve: There was no significant regurgitation. - Pulmonic valve: There was no significant regurgitation.   DVT Prophylaxis  : SCDs   Lab Results  Component Value Date   PLT 167 01/25/2018    Diet :  Diet Order            DIET SOFT Room service appropriate? Yes; Fluid consistency: Thin  Diet effective now               Inpatient Medications Scheduled Meds: . cephALEXin  500 mg Oral Q12H  . digoxin  0.125 mg Oral Daily  . docusate sodium  100 mg Oral BID  . insulin aspart  0-9 Units Subcutaneous TID WC  . insulin glargine  25 Units Subcutaneous BID  . metoprolol succinate  25 mg Oral Daily  . pantoprazole  40 mg Oral Daily  . polyethylene glycol  17 g Oral BID  . rOPINIRole  1 mg Oral QHS  . senna  1 tablet Oral BID  . verapamil  240 mg Oral Daily   Continuous Infusions: . lactated ringers Stopped (01/25/18 0557)  . methocarbamol (ROBAXIN) IV 500 mg (01/23/18 1051)  . vancomycin 166.7 mL/hr at 01/25/18 0600   PRN Meds:.acetaminophen **OR** acetaminophen, alum & mag hydroxide-simeth, bisacodyl, diltiazem, diphenhydrAMINE, hydrALAZINE, LORazepam, methocarbamol **OR** methocarbamol (ROBAXIN) IV, metoprolol tartrate, morphine injection, naphazoline-glycerin, nitroGLYCERIN, [DISCONTINUED] ondansetron **OR** ondansetron (ZOFRAN) IV, oxyCODONE-acetaminophen, sodium phosphate  Antibiotics  :   Anti-infectives (From admission, onward)   Start     Dose/Rate Route Frequency Ordered Stop   01/24/18 0600  vancomycin (VANCOCIN) 1,250 mg in sodium chloride 0.9 % 250 mL IVPB     1,250 mg 166.7  mL/hr over 90 Minutes Intravenous Every 24 hours 01/23/18 1056     01/23/18 0600  vancomycin (VANCOCIN) 1,750 mg in sodium chloride 0.9 % 500 mL IVPB  Status:  Discontinued     1,750 mg 250 mL/hr over 120 Minutes Intravenous Every 24 hours 01/23/18 0012 01/23/18 1056   01/22/18 2020  bacitracin 50,000 Units in sodium chloride 0.9 % 500 mL irrigation  Status:  Discontinued       As needed 01/22/18 2021 01/22/18 2216   01/22/18 1800  cephALEXin (KEFLEX) capsule 500 mg     500 mg Oral Every 12 hours 01/22/18 0945 01/25/18 0939   01/22/18 0600  ciprofloxacin (CIPRO) IVPB 400 mg  Status:  Discontinued     400 mg 200 mL/hr over 60 Minutes Intravenous Every 12 hours 01/21/18 2237 01/22/18 0906   01/21/18 1845  ciprofloxacin (CIPRO) IVPB 400 mg     400 mg 200 mL/hr over 60 Minutes Intravenous  Once 01/21/18 1832 01/21/18 2033          Objective:   Vitals:   01/25/18 0400 01/25/18 0500 01/25/18 0600 01/25/18 0700  BP: (!) 143/54 (!) 135/46 (!) 133/58 (!) 146/54  Pulse: 69 67 68 70  Resp: 18 17 14 15   Temp: 100 F (37.8 C)     TempSrc: Axillary     SpO2: 94% 94% 95% 95%  Weight:      Height:        Wt Readings from Last 3 Encounters:  01/22/18 94.3 kg  12/28/17 94.3 kg  12/21/17 94.3 kg     Intake/Output Summary (Last 24 hours) at 01/25/2018 0824 Last data filed at 01/25/2018 0600 Gross per 24 hour  Intake 2211.93 ml  Output 3855 ml  Net -1643.07 ml     Physical Exam  Awake Alert, Oriented X 3, wearing c-collar, chronic upper extremity weakness with right more than left, lower extremity right leg 2/5 left leg 1/5 some improvement, foley in place [place].AT,PERRAL Supple Neck,No JVD, No cervical lymphadenopathy appriciated.  Symmetrical Chest wall movement, Good air movement bilaterally, CTAB RRR,No Gallops, Rubs or new Murmurs, No Parasternal Heave +ve B.Sounds, Abd Soft, No tenderness, No organomegaly appriciated, No rebound - guarding or rigidity. No Cyanosis, Clubbing or  edema, No new Rash or bruise    Data Review:    CBC Recent Labs  Lab 01/21/18 1612 01/22/18 0042 01/23/18 0327 01/24/18 0335 01/25/18 0401  WBC 19.0* 10.2 11.7* 8.8 9.1  HGB 13.8 12.8 10.4* 9.9* 10.4*  HCT 42.6 38.8 31.5* 31.5* 32.0*  PLT 175 181 164 159 167  MCV 87.3 85.7 86.3 89.2 87.4  MCH 28.3 28.3 28.5 28.0 28.4  MCHC 32.4 33.0 33.0 31.4 32.5  RDW 13.1 13.2 13.2 13.6 13.4  LYMPHSABS 1.1  --   --   --   --   MONOABS 0.9  --   --   --   --   EOSABS 0.0  --   --   --   --   BASOSABS 0.1  --   --   --   --     Chemistries  Recent Labs  Lab 01/21/18 1612 01/22/18 0042 01/23/18 0327 01/24/18 0335 01/25/18 0401  NA 136 133* 130* 134* 134*  K 4.1 4.4 4.4 4.2 4.2  CL 102 98 98 102 100  CO2 21* 22 24 24 24   GLUCOSE 204* 189* 189* 112* 123*  BUN 22 19 20 19 16   CREATININE 0.84 0.84 0.92 0.81 0.86  CALCIUM 9.6 9.1 8.4* 8.3* 8.5*  MG  --  1.6*  --   --  2.1   ------------------------------------------------------------------------------------------------------------------ No results for input(s): CHOL, HDL, LDLCALC, TRIG, CHOLHDL, LDLDIRECT in the last 72 hours.  Lab Results  Component Value Date   HGBA1C 7.6 (H) 11/12/2017   ------------------------------------------------------------------------------------------------------------------ No results for input(s): TSH, T4TOTAL, T3FREE, THYROIDAB in the last 72 hours.  Invalid input(s): FREET3 ------------------------------------------------------------------------------------------------------------------ No results for input(s): VITAMINB12, FOLATE, FERRITIN, TIBC, IRON, RETICCTPCT in the last 72 hours.  Coagulation profile Recent Labs  Lab 01/21/18 1612  INR 0.94    No results for input(s): DDIMER in the last 72 hours.  Cardiac Enzymes Recent Labs  Lab 01/22/18 0042 01/22/18 0645 01/22/18 1114  TROPONINI 0.59* 0.54* 0.36*    ------------------------------------------------------------------------------------------------------------------    Component Value Date/Time   BNP 10.6 02/14/2015 0341    Micro Results Recent Results (from the past 240 hour(s))  Surgical pcr screen     Status: None   Collection Time: 01/22/18  5:48 PM  Result Value Ref Range Status   MRSA, PCR NEGATIVE NEGATIVE Final   Staphylococcus aureus NEGATIVE NEGATIVE Final    Comment: (NOTE) The Xpert SA Assay (FDA approved for NASAL specimens in patients 92 years of age and older), is one component of a comprehensive surveillance program. It is not intended to diagnose infection nor to guide or monitor treatment. Performed at [facility], [place] [address] [place], [place] [address]     Radiology Reports Dg Cervical Spine 2-3 Views  Result Date: 01/22/2018  CLINICAL DATA:  Cervical spine decompression and fusion. EXAM: CERVICAL SPINE - 2-3 VIEW; DG C-ARM 61-120 MIN COMPARISON:  None. FINDINGS: Two C-arm views of the cervical spine demonstrate pedicle screw and rod an plate and screw fusion at multiple levels. The bone details are not adequately visualized to determine the levels involved. IMPRESSION: Operative changes, as described above. Electronically Signed   By: Claudie Revering M.D.   On: 01/22/2018 22:00   Dg Shoulder Right  Result Date: 01/21/2018 CLINICAL DATA:  Fall EXAM: RIGHT SHOULDER - 2+ VIEW COMPARISON:  07/19/2011 FINDINGS: Negative for fracture or dislocation. Mild degenerative change in the [facility] joint and shoulder joint. Mild associated spurring. IMPRESSION: Negative for fracture or dislocation. Electronically Signed   By: Franchot Gallo M.D.   On: 01/21/2018 14:59   Ct Head Wo Contrast  Result Date: 01/21/2018 CLINICAL DATA:  Patient status post C7-T1 fusion 10/24/2017. History of prior C4-6 fusion. Status post fall off a porch today. Limited range of motion of the head and new tingling in the left fingers since the  fall. Initial encounter. EXAM: CT HEAD WITHOUT CONTRAST CT CERVICAL SPINE WITHOUT CONTRAST TECHNIQUE: Multidetector CT imaging of the head and cervical spine was performed following the standard protocol without intravenous contrast. Multiplanar CT image reconstructions of the cervical spine were also generated. COMPARISON:  Brain MRI 01/12/2018. Cervical spine CT scan 12/03/2017. Head CT 09/21/2017. FINDINGS: CT HEAD FINDINGS Brain: No evidence of acute infarction, hemorrhage, hydrocephalus, extra-axial collection or mass lesion/mass effect. Vascular: No hyperdense vessel or unexpected calcification. Skull: Intact.  No focal lesion. Sinuses/Orbits: Small mucous retention cyst or polyp right maxillary sinus is unchanged. The patient is status post cataract surgery bilaterally. Other: None. CT CERVICAL SPINE FINDINGS Alignment: Approximately 0.4 cm anterolisthesis C7 on T1 is unchanged since the prior CT. Alignment is otherwise maintained. Skull base and vertebrae: The patient has an acute fracture of C7. The fracture extends from the left side of the vertebral body into the left C7 facet and base of the left transverse process. The fracture is minimally displaced at 1-2 mm. No other fracture is identified. The interbody spacer and C7 appears to be incorporating into the endplates. Soft tissues and spinal canal: No epidural hematoma is identified. Disc levels: Loss of disc space height and endplate spurring at Y1-0 appear unchanged. Upper chest: Lung apices are clear. Other: None. IMPRESSION: Acute fracture of the left side of the C7 vertebral body extends into the left facet and base of the left transverse process. No other acute abnormality is identified. 0.4 cm anterolisthesis C7 on T1 is unchanged. Status post C4-6 and C7-T1 fusion. No acute intracranial normality. Electronically Signed   By: Inge Rise M.D.   On: 01/21/2018 15:32   Ct Cervical Spine Wo Contrast  Result Date: 01/21/2018 CLINICAL DATA:   Patient status post C7-T1 fusion 10/24/2017. History of prior C4-6 fusion. Status post fall off a porch today. Limited range of motion of the head and new tingling in the left fingers since the fall. Initial encounter. EXAM: CT HEAD WITHOUT CONTRAST CT CERVICAL SPINE WITHOUT CONTRAST TECHNIQUE: Multidetector CT imaging of the head and cervical spine was performed following the standard protocol without intravenous contrast. Multiplanar CT image reconstructions of the cervical spine were also generated. COMPARISON:  Brain MRI 01/12/2018. Cervical spine CT scan 12/03/2017. Head CT 09/21/2017. FINDINGS: CT HEAD FINDINGS Brain: No evidence of acute infarction, hemorrhage, hydrocephalus, extra-axial collection or mass lesion/mass effect. Vascular: No hyperdense vessel or unexpected calcification. Skull: Intact.  No focal lesion. Sinuses/Orbits: Small mucous retention cyst or polyp right maxillary sinus is unchanged. The patient is status post cataract surgery bilaterally. Other: None. CT CERVICAL SPINE FINDINGS Alignment: Approximately 0.4 cm anterolisthesis C7 on T1 is unchanged since the prior CT. Alignment is otherwise maintained. Skull base and vertebrae: The patient has an acute fracture of C7. The fracture extends from the left side of the vertebral body into the left C7 facet and base of the left transverse process. The fracture is minimally displaced at 1-2 mm. No other fracture is identified. The interbody spacer and C7 appears to be incorporating into the endplates. Soft tissues and spinal canal: No epidural hematoma is identified. Disc levels: Loss of disc space height and endplate spurring at O2-7 appear unchanged. Upper chest: Lung apices are clear. Other: None. IMPRESSION: Acute fracture of the left side of the C7 vertebral body extends into the left facet and base of the left transverse process. No other acute abnormality is identified. 0.4 cm anterolisthesis C7 on T1 is unchanged. Status post C4-6 and  C7-T1 fusion. No acute intracranial normality. Electronically Signed   By: Inge Rise M.D.   On: 01/21/2018 15:32   Mr Jodene Nam Head Wo Contrast  Result Date: 01/13/2018  [facility] [address] [place], [place] [address] [phone] NEUROIMAGING REPORT STUDY DATE: 01/12/2018 PATIENT NAME: MILEA KLINK DOB: 1940/08/13 MRN: [id number] EXAM: MR angiogram of the intracranial arteries ORDERING CLINICIAN: Andrey Spearman MD CLINICAL HISTORY: 77 year old woman with vertigo and vertebrobasilar TIAs COMPARISON FILMS: None TECHNIQUE: MR angiogram of the head was obtained utilizing 3D time of flight sequences from below the vertebrobasilar junction up to the intracranial vasculature without contrast.  Computerized reconstructions were obtained. CONTRAST: None IMAGING SITE: [facility], [place], [place], [place] FINDINGS: The imaged extracranial and intracranial portions of the internal carotid arteries appear normal. The middle cerebral appear normal.  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   In the posterior circulation, the left vertebral artery is mildly dominant.  No stenosis is noted within the vertebral arteries and the basilar arteries.  Posterior communicating arteries are noted bilaterally, right greater than left.  The right posterior artery derives most of its flow from the anterior circulation through the right posterior communicating artery.  The posterior cerebral arteries do not appear to have stenosis.. No aneurysms were identified.   This MR angiogram of the intracranial arteries shows the following: 1.    Normal variant anatomy with an absent A1 segment of the right anterior cerebral artery.  There is a fetal origin of the right posterior cerebral artery. 2.    No significant arterial stenosis.  No aneurysms.  INTERPRETING  PHYSICIAN: Richard A. Felecia Shelling, MD, PhD, FAAN Certified in  Neuroimaging by [employer]   Mr Jodene Nam Neck W Wo Contrast  Result Date: 01/13/2018  [facility] [address], [place], [place] [address] [phone] NEUROIMAGING REPORT STUDY DATE: 01/12/2018 PATIENT NAME: PAETYN PIETRZAK DOB: 1940-10-02 MRN: [id number] EXAM: MR Angiogram of the Neck Arteries with and without contrast ORDERING CLINICIAN: Andrey Spearman MD CLINICAL HISTORY: 78 year old woman with vertebrobasilar TIAs COMPARISON FILMS: None TECHNIQUE: MR angiogram of the neck was obtained utilizing 3D time-of-flight sequences from the aortic arch up to the intracranial vasculature postbolus contrast infusion.  2D time-of-flight noncontrast views and computerized reconstructions were obtained. CONTRAST: 19 mL  [facility] IMAGING SITE: [facility], [address], [place], [place] FINDINGS: This study is of adequate technical quality.  There is anterograde flow in the bilateral vertebral and carotid arteries on 2D-TOF views.  The flow signal of the left subclavian artery has no stenosis.  The left common and external carotid arteries have no stenosis.  There is minimal (10 to 15%) stenosis at the origin of the left internal carotid artery.  This is hemodynamically significant.  The left vertebral artery is mildly smaller than the right.  The origin appears normal.  There is reduced flow signal in the proximal cervical segment that is most likely artifactual.  The more distal left vertebral artery has no stenosis up to the vertebrobasilar junction.  On the right, the brachiocephalic trunk and subclavian arteries have no stenosis. The right common, internal and external carotid arteries have no stenosis. The right vertebral artery has no stenosis from its origin to the vertebrobasilar junction. Limited views of the intracranial vasculature are unremarkable.   This MR angiogram of the neck arteries shows the  following: 1.    Although there is reduced flow signal in the proximal cervical segment of the left vertebral artery on the source and reconstructed images, this is most likely artifactual as there is completely normal flow more distally. 2.    Minimal (less than 15%) stenosis at the origin of the left internal carotid artery.  This is not hemodynamically significant.  3.    Other arteries appear normal. INTERPRETING PHYSICIAN: Richard A. Felecia Shelling, MD, PhD, FAAN Certified in  Neuroimaging by [employer]   Mr Cervical Spine Wo Contrast  Result Date: 01/22/2018 CLINICAL DATA:  Fall with left-sided vertebral body and posterior element fractures at C7. Prior cervical fusion. Sudden lower extremity weakness. EXAM: MRI CERVICAL SPINE WITHOUT CONTRAST TECHNIQUE: Multiplanar, multisequence MR imaging of the cervical spine was performed. No intravenous contrast was administered. COMPARISON:  Cervical spine CT 01/21/2018 and MRI 08/07/2017 FINDINGS: Alignment: Grade 1 anterolisthesis of C6 on C7 and C7 on T1, unchanged. Vertebrae: Bilateral C7 facet edema with a left-sided fracture shown on yesterday's CT. Fracture component involving the C7 vertebral body not well demonstrated by MRI with evaluation limited by artifact from C4-T1 anterior fusion plates and screws. Cord: T2 hyperintensity in the cord at C6-7 with cord expansion consistent with edema. Suspected focal hemorrhage along the posterior margin of the cord at C7 (series 8, image 26), uncertain if this is intramedullary or extramedullary in location. Noncompressive dorsal epidural hematoma throughout the cervical and included upper thoracic spine. Posterior Fossa, vertebral arteries, paraspinal tissues: Extensive edema and hematoma throughout the musculature and other soft tissues of the posterior neck bilaterally. STIR hyperintensity in the interspinous soft tissues at C6-7 with possible disruption of the ligamentum flavum. Disc levels: C2-3:  Mild facet arthrosis and tiny central disc protrusion without stenosis, unchanged from the prior MRI. C3-4: Broad-based posterior disc osteophyte complex and minimal facet arthrosis result in mild spinal stenosis and moderate to severe right and moderate left neural foraminal stenosis, unchanged from the prior MRI. C4-5: Prior ACDF.  No significant stenosis. C5-6: Prior ACDF.  No significant stenosis. C6-7: Prior ACDF. Slight anterolisthesis with epidural hematoma. Mild spinal stenosis. C7-T1: ACDF. Anterolisthesis and facet arthrosis result in moderate bilateral neural foraminal stenosis. Epidural hematoma with mild spinal stenosis. T1-2: Small right central disc protrusion, unchanged from the prior MRI and slightly deforming the ventral spinal cord. Unchanged mild left neural foraminal stenosis. T2-3: Mild disc bulging with borderline  bilateral neural foraminal stenosis and no spinal stenosis. T3-4: Small central disc extrusion mildly deforming the ventral spinal cord, similar to the prior MRI. T5-6 and T6-7: Only imaged sagittally. Small central disc herniations at both levels contacting and at most slightly deforming the spinal cord. IMPRESSION: 1. C6-7 spinal cord edema with focal dorsal intramedullary versus extramedullary/epidural hemorrhage posteriorly, favor the latter. 2. Extensive edema and hemorrhage throughout the posterior neck soft tissues. Possible posterior ligamentous complex injury at C6-7. 3. C7 vertebral and posterior element fracture better demonstrated on CT. 4. Noncompressive dorsal epidural hematoma throughout the cervical and visualized upper thoracic spine. These results were called by telephone at the time of interpretation on 01/22/2018 at 3:13 pm to Dr. [name], who verbally acknowledged these results. Electronically Signed   By: Logan Bores M.D.   On: 01/22/2018 15:49   Dg Shoulder Left  Result Date: 01/21/2018 CLINICAL DATA:  [address] down stairs EXAM: LEFT SHOULDER - 2+ VIEW  COMPARISON:  None. FINDINGS: There is no evidence of fracture or dislocation. There is no evidence of arthropathy or other focal bone abnormality. Soft tissues are unremarkable. IMPRESSION: Negative. Electronically Signed   By: Franchot Gallo M.D.   On: 01/21/2018 15:00   Dg C-arm 1-60 Min  Result Date: 01/22/2018 CLINICAL DATA:  Cervical spine decompression and fusion. EXAM: CERVICAL SPINE - 2-3 VIEW; DG C-ARM 61-120 MIN COMPARISON:  None. FINDINGS: Two C-arm views of the cervical spine demonstrate pedicle screw and rod an plate and screw fusion at multiple levels. The bone details are not adequately visualized to determine the levels involved. IMPRESSION: Operative changes, as described above. Electronically Signed   By: Claudie Revering M.D.   On: 01/22/2018 22:00   Mr Brain/iac W Wo Contrast  Result Date: 01/13/2018  [facility] [address] [place], [place] [address] [phone] NEUROIMAGING REPORT STUDY DATE: 01/12/2018 PATIENT NAME: FOREVER ARECHIGA DOB: 01/06/1941 MRN: [id number] EXAM: MRI Brain with and without contrast ORDERING CLINICIAN: Andrey Spearman MD CLINICAL HISTORY: 77 year old woman with vertigo COMPARISON FILMS: None TECHNIQUE:MRI of the brain with and without contrast was obtained utilizing 5 mm axial slices with T1, T2, T2 flair, SWI and diffusion weighted views.  T1 sagittal and postcontrast views in the axial and coronal plane were obtained.  Additional thin section pre-and post contrasted axial and coronal T1-weighted and axial  CISS images through the internal auditory canals were performed. CONTRAST: 19 ml [employer] IMAGING SITE: [facility], [place]. FINDINGS: On sagittal images, the spinal cord is imaged caudally to C3 and is normal in caliber.   The contents of the posterior fossa are of normal size and position.   The pituitary gland and optic chiasm appear normal.   The third and lateral ventricles are mildly enlarged, in  proportion to the extent of mild generalized cortical atrophy.  There are no abnormal extra-axial collections of fluid.  There are T2/FLAIR hyperintense foci in the pons.  The cerebellum and deep gray matter appears normal.  In the hemispheres, there are some scattered T2/FLAIR hyperintense foci in the subcortical and deep white matter.  None of the foci appears to be acute.   Diffusion weighted images are normal.  Susceptibility weighted images are normal.   The VIIth/VIIIth nerve complexes appear normal before and after contrast.  There have been bilateral lens replacements.  The orbits are otherwise normal.  The mastoid air cells appear normal.  The paranasal sinuses appear normal.  Flow voids are identified within  the major intracerebral arteries.  After the infusion of contrast material, a normal enhancement pattern is noted.   This MRI of the brain with and without contrast with added attention to the internal auditory canal shows the following: 1.     T2/FLAIR hypertense foci in the pons and hemispheres consistent with chronic microvascular ischemic change.  None of the foci appears to be acute. 2.     Mild generalized cortical atrophy. 3.     The internal auditory canals appear normal before and after contrast. INTERPRETING PHYSICIAN: Richard A. Felecia Shelling, MD, PhD, FAAN Certified in  Neuroimaging by [employer]    Time Spent in minutes  30   Lala Lund M.D on 01/25/2018 at 8:24 AM  To page go to [email or web address] - password [facility]

## 2018-01-25 NOTE — Progress Notes (Signed)
Report called to [place] on 5 W

## 2018-01-26 LAB — GLUCOSE, CAPILLARY
Glucose-Capillary: 89 mg/dL (ref 70–99)
Glucose-Capillary: 124 mg/dL — ABNORMAL HIGH (ref 70–99)
Glucose-Capillary: 176 mg/dL — ABNORMAL HIGH (ref 70–99)
Glucose-Capillary: 142 mg/dL — ABNORMAL HIGH (ref 70–99)

## 2018-01-26 NOTE — Progress Notes (Signed)
Doing quite a bit better today.  Pain is well controlled.  Much improved movement in both lower extremities.  Still with significant right distal upper extremity weakness.  Continue supportive care.  No new recommendations.

## 2018-01-26 NOTE — Progress Notes (Signed)
PROGRESS NOTE                                                                                                                                                                                                             Patient Demographics:    [name], is a 77 y.o. female, DOB - November 08, 1940, KDT:[id number]  Admit date - 01/21/2018   Admitting Physician [name], MD  Outpatient Primary MD for the patient is [name], [name], MD  LOS - 4  Chief Complaint  Patient presents with  . Fall       Brief Narrative  [name] is a 77 y.o. female with history of SVT, hypertension, fibromyalgia, diabetes mellitus had a fall at home while she was trying to open her door.  She struck her head when she fell from a porch.  Denied losing consciousness, nochest pain or palpitations.  He came to the ER where a C-spine CT scan showed an acute C7 fracture, Dr. [name] neurosurgeon on-call was consulted by the ER physician who advised a c-collar which was placed, she was subsequently admitted to the hospital.  Early this morning patient requested that she be placed in the chair as she was not comfortable in the bed, she was placed in the chair with assistance, she did fine however after about 45 minutes of sitting up in the chair she reported that she is unable to move her legs further work-up showed that she had bled into her spinal cord space and she was urgently taken to the OR by neurosurgeon Dr. [name].    Subjective:   Patient in bed, appears comfortable, denies any headache, no fever, no chest pain or pressure, no shortness of breath , no abdominal pain.  She feels her lower extremity weakness is improving gradually.   Assessment  & Plan :     1.  Mechanical fall, acute C7 fracture.  Complicated by spinal cord hematoma and edema causing cord compression.  Patient had chronic upper extremity weakness right greater than left and has now developed lateral lower extremity  weakness with some continued improvement since her decompression surgery right leg now 3/5 left leg 1-2/5.  She is in c-collar, she was emergently taken to the OR by Dr. [name] on 01/22/2018 and underwent Posterior decompression with laminectomy C6-C7, posterior fixation C4-T1 with T1 pedicle screws and C4-C7 facet screws bilaterally posterior lateral arthrodesis with local autograft C6 and C7.  She required 2 units of packed RBC  during the surgery in the OR thereafter H&H is stable, has tolerated the procedure well, continue PT OT, lower extremity strength minimally improved, will require SNF.  2.  Multiple C-spine surgeries last CT 7 to T1 fusion in September of this year, multiple falls at home.  Had detailed discussion with patient's daughter bedside, patient has had several falls over the last several months and she had been recommended going to rehab multiple times but patient had been refusing it and been noncompliant with recommendations.  Now agreeable to going to an SNF or CIR, request made.   3.  Chronic bilateral upper extremity right more than left weakness.  Continue PT/OT, will require placement.  4.  History of SVT.  On metoprolol, verapamil and digoxin combination which will be continued.  5.  HX of restless leg syndrome along with peripheral neuropathy.  On Requip continue.  6.  Essential hypertension.  On combination of metoprolol and ACE inhibitor along with verapamil.  Continue and monitor.  7.  Chronic diastolic CHF EF 28% on echocardiogram done in November 2019.  Mildly elevated troponin and nonspecific non-ACS pattern.  No chest pain, no shortness of breath, EKG nonacute.  Continue combination of aspirin and beta-blocker for secondary prevention and monitor.  No acute issues.   8.  GERD.  On PPI.   9.  UTI.  Pleated her Keflex course.  10.  Hyponatremia.  This was SIADH improved with fluid restriction.   11. Ur.Retention and Constipation -based on aggressive bowel  regimen, will give an additional suppository today as well, failed Foley removal, Foley reinserted on 01/25/2018, Flomax started.  Will monitor.    12.  Hypomagnesemia.  Replaced and stable.    13. DM type II.  On Lantus twice daily along with sliding scale will monitor.  CBGs stable for now.  CBG (last 3)  Recent Labs    01/25/18 1119 01/25/18 2154 01/26/18 0820  GLUCAP 104* 159* 30      Family Communication  :  Daughter bedside  Code Status :  Full  Disposition Plan  :  TBD  Consults  :  N.Surg  Procedures  :    01/22/18 by Dr [name] - Posterior decompression with laminectomy C6-C7, posterior fixation C4-T1 with T1 pedicle screws and C4-C7 facet screws bilaterally posterior lateral arthrodesis with local autograft C6 and C7.  CT C spine - Acute fracture of the left side of the C7 vertebral body extends into the left facet and base of the left transverse process. No other acute abnormality is identified. 0.4 cm anterolisthesis C7 on T1 is unchanged. Status post C4-6 and C7-T1 fusion.  MRI C Spine - 1. C6-7 spinal cord edema with focal dorsal intramedullary versus extramedullary/epidural hemorrhage posteriorly, favor the latter. 2. Extensive edema and hemorrhage throughout the posterior neck soft tissues. Possible posterior ligamentous complex injury at C6-7. 3. C7 vertebral and posterior element fracture better demonstrated on CT. 4. Noncompressive dorsal epidural hematoma throughout the cervical and visualized upper thoracic spine. These results were called by telephone at the time of interpretation on 01/22/2018 at 3:13 pm to Dr. [name], who verbally acknowledged these results.  TTE Nov 2019 -   Left ventricle: The cavity size was normal. Systolic function was  normal. The estimated ejection fraction was in the range of 60% to 65%. Wall motion was normal; there were no regional wall motion abnormalities. Left ventricular diastolic function parameters were normal for the  patient&'s age. - Aortic valve: Trileaflet; mildly thickened, mildly  calcified leaflets. Left coronary cusp predominanly affected by calcification. Sclerosis without stenosis. There was no   regurgitation. - Mitral valve: There was no significant regurgitation. - Right ventricle: Systolic function was normal. - Atrial septum: No defect or patent foramen ovale was identified. - Tricuspid valve: There was no significant regurgitation. - Pulmonic valve: There was no significant regurgitation.   DVT Prophylaxis  : SCDs   Lab Results  Component Value Date   PLT 167 01/25/2018    Diet :  Diet Order            DIET SOFT Room service appropriate? Yes; Fluid consistency: Thin  Diet effective now               Inpatient Medications Scheduled Meds: . digoxin  0.125 mg Oral Daily  . docusate sodium  100 mg Oral BID  . insulin aspart  0-9 Units Subcutaneous TID WC  . insulin glargine  25 Units Subcutaneous BID  . metoprolol succinate  25 mg Oral Daily  . pantoprazole  40 mg Oral Daily  . polyethylene glycol  17 g Oral BID  . rOPINIRole  1 mg Oral QHS  . senna  1 tablet Oral BID  . tamsulosin  0.4 mg Oral Daily  . verapamil  240 mg Oral Daily   Continuous Infusions: . lactated ringers 75 mL/hr at 01/26/18 0600  . methocarbamol (ROBAXIN) IV 500 mg (01/23/18 1051)  . vancomycin 166.7 mL/hr at 01/26/18 0600   PRN Meds:.acetaminophen **OR** acetaminophen, alum & mag hydroxide-simeth, bisacodyl, diltiazem, diphenhydrAMINE, hydrALAZINE, LORazepam, methocarbamol **OR** methocarbamol (ROBAXIN) IV, metoprolol tartrate, morphine injection, naphazoline-glycerin, nitroGLYCERIN, [DISCONTINUED] ondansetron **OR** ondansetron (ZOFRAN) IV, oxyCODONE-acetaminophen, sodium chloride, sodium phosphate  Antibiotics  :   Anti-infectives (From admission, onward)   Start     Dose/Rate Route Frequency Ordered Stop   01/24/18 0600  vancomycin (VANCOCIN) 1,250 mg in sodium chloride 0.9 % 250 mL IVPB      1,250 mg 166.7 mL/hr over 90 Minutes Intravenous Every 24 hours 01/23/18 1056     01/23/18 0600  vancomycin (VANCOCIN) 1,750 mg in sodium chloride 0.9 % 500 mL IVPB  Status:  Discontinued     1,750 mg 250 mL/hr over 120 Minutes Intravenous Every 24 hours 01/23/18 0012 01/23/18 1056   01/22/18 2020  bacitracin 50,000 Units in sodium chloride 0.9 % 500 mL irrigation  Status:  Discontinued       As needed 01/22/18 2021 01/22/18 2216   01/22/18 1800  cephALEXin (KEFLEX) capsule 500 mg     500 mg Oral Every 12 hours 01/22/18 0945 01/25/18 0939   01/22/18 0600  ciprofloxacin (CIPRO) IVPB 400 mg  Status:  Discontinued     400 mg 200 mL/hr over 60 Minutes Intravenous Every 12 hours 01/21/18 2237 01/22/18 0906   01/21/18 1845  ciprofloxacin (CIPRO) IVPB 400 mg     400 mg 200 mL/hr over 60 Minutes Intravenous  Once 01/21/18 1832 01/21/18 2033          Objective:   Vitals:   01/25/18 2344 01/26/18 0441 01/26/18 0833 01/26/18 0834  BP:  131/72 (!) 163/78   Pulse:  66 69 69  Resp:  18    Temp: 98.6 F (37 C) 98.7 F (37.1 C) 98.7 F (37.1 C)   TempSrc: Oral  Oral   SpO2:  95%    Weight:      Height:        Wt Readings from Last 3 Encounters:  01/22/18 94.3 kg  12/28/17 94.3 kg  12/21/17 94.3 kg     Intake/Output Summary (Last 24 hours) at 01/26/2018 0845 Last data filed at 01/26/2018 0600 Gross per 24 hour  Intake 1581.51 ml  Output 500 ml  Net 1081.51 ml     Physical Exam  Awake Alert, Oriented X 3, wearing c-collar, chronic upper extremity weakness with right more than left, lower extremity right leg 2/5 left leg 1/5 some improvement, foley in place [place].AT,PERRAL Supple Neck,No JVD, No cervical lymphadenopathy appriciated.  Symmetrical Chest wall movement, Good air movement bilaterally, CTAB RRR,No Gallops, Rubs or new Murmurs, No Parasternal Heave +ve B.Sounds, Abd Soft, No tenderness, No organomegaly appriciated, No rebound - guarding or rigidity. No Cyanosis,  Clubbing or edema, No new Rash or bruise     Data Review:    CBC Recent Labs  Lab 01/21/18 1612 01/22/18 0042 01/23/18 0327 01/24/18 0335 01/25/18 0401  WBC 19.0* 10.2 11.7* 8.8 9.1  HGB 13.8 12.8 10.4* 9.9* 10.4*  HCT 42.6 38.8 31.5* 31.5* 32.0*  PLT 175 181 164 159 167  MCV 87.3 85.7 86.3 89.2 87.4  MCH 28.3 28.3 28.5 28.0 28.4  MCHC 32.4 33.0 33.0 31.4 32.5  RDW 13.1 13.2 13.2 13.6 13.4  LYMPHSABS 1.1  --   --   --   --   MONOABS 0.9  --   --   --   --   EOSABS 0.0  --   --   --   --   BASOSABS 0.1  --   --   --   --     Chemistries  Recent Labs  Lab 01/21/18 1612 01/22/18 0042 01/23/18 0327 01/24/18 0335 01/25/18 0401  NA 136 133* 130* 134* 134*  K 4.1 4.4 4.4 4.2 4.2  CL 102 98 98 102 100  CO2 21* 22 24 24 24   GLUCOSE 204* 189* 189* 112* 123*  BUN 22 19 20 19 16   CREATININE 0.84 0.84 0.92 0.81 0.86  CALCIUM 9.6 9.1 8.4* 8.3* 8.5*  MG  --  1.6*  --   --  2.1   ------------------------------------------------------------------------------------------------------------------ No results for input(s): CHOL, HDL, LDLCALC, TRIG, CHOLHDL, LDLDIRECT in the last 72 hours.  Lab Results  Component Value Date   HGBA1C 7.6 (H) 11/12/2017   ------------------------------------------------------------------------------------------------------------------ No results for input(s): TSH, T4TOTAL, T3FREE, THYROIDAB in the last 72 hours.  Invalid input(s): FREET3 ------------------------------------------------------------------------------------------------------------------ No results for input(s): VITAMINB12, FOLATE, FERRITIN, TIBC, IRON, RETICCTPCT in the last 72 hours.  Coagulation profile Recent Labs  Lab 01/21/18 1612  INR 0.94    No results for input(s): DDIMER in the last 72 hours.  Cardiac Enzymes Recent Labs  Lab 01/22/18 0042 01/22/18 0645 01/22/18 1114  TROPONINI 0.59* 0.54* 0.36*    ------------------------------------------------------------------------------------------------------------------    Component Value Date/Time   BNP 10.6 02/14/2015 0341    Micro Results Recent Results (from the past 240 hour(s))  Surgical pcr screen     Status: None   Collection Time: 01/22/18  5:48 PM  Result Value Ref Range Status   MRSA, PCR NEGATIVE NEGATIVE Final   Staphylococcus aureus NEGATIVE NEGATIVE Final    Comment: (NOTE) The Xpert SA Assay (FDA approved for NASAL specimens in patients 80 years of age and older), is one component of a comprehensive surveillance program. It is not intended to diagnose infection nor to guide or monitor treatment. Performed at [facility], [place] [address] [place], [place] [address]     Radiology Reports Dg Cervical Spine 2-3  Views  Result Date: 01/22/2018 CLINICAL DATA:  Cervical spine decompression and fusion. EXAM: CERVICAL SPINE - 2-3 VIEW; DG C-ARM 61-120 MIN COMPARISON:  None. FINDINGS: Two C-arm views of the cervical spine demonstrate pedicle screw and rod an plate and screw fusion at multiple levels. The bone details are not adequately visualized to determine the levels involved. IMPRESSION: Operative changes, as described above. Electronically Signed   By: Claudie Revering M.D.   On: 01/22/2018 22:00   Dg Shoulder Right  Result Date: 01/21/2018 CLINICAL DATA:  Fall EXAM: RIGHT SHOULDER - 2+ VIEW COMPARISON:  07/19/2011 FINDINGS: Negative for fracture or dislocation. Mild degenerative change in the [facility] joint and shoulder joint. Mild associated spurring. IMPRESSION: Negative for fracture or dislocation. Electronically Signed   By: Franchot Gallo M.D.   On: 01/21/2018 14:59   Ct Head Wo Contrast  Result Date: 01/21/2018 CLINICAL DATA:  Patient status post C7-T1 fusion 10/24/2017. History of prior C4-6 fusion. Status post fall off a porch today. Limited range of motion of the head and new tingling in the left fingers since the  fall. Initial encounter. EXAM: CT HEAD WITHOUT CONTRAST CT CERVICAL SPINE WITHOUT CONTRAST TECHNIQUE: Multidetector CT imaging of the head and cervical spine was performed following the standard protocol without intravenous contrast. Multiplanar CT image reconstructions of the cervical spine were also generated. COMPARISON:  Brain MRI 01/12/2018. Cervical spine CT scan 12/03/2017. Head CT 09/21/2017. FINDINGS: CT HEAD FINDINGS Brain: No evidence of acute infarction, hemorrhage, hydrocephalus, extra-axial collection or mass lesion/mass effect. Vascular: No hyperdense vessel or unexpected calcification. Skull: Intact.  No focal lesion. Sinuses/Orbits: Small mucous retention cyst or polyp right maxillary sinus is unchanged. The patient is status post cataract surgery bilaterally. Other: None. CT CERVICAL SPINE FINDINGS Alignment: Approximately 0.4 cm anterolisthesis C7 on T1 is unchanged since the prior CT. Alignment is otherwise maintained. Skull base and vertebrae: The patient has an acute fracture of C7. The fracture extends from the left side of the vertebral body into the left C7 facet and base of the left transverse process. The fracture is minimally displaced at 1-2 mm. No other fracture is identified. The interbody spacer and C7 appears to be incorporating into the endplates. Soft tissues and spinal canal: No epidural hematoma is identified. Disc levels: Loss of disc space height and endplate spurring at J1-9 appear unchanged. Upper chest: Lung apices are clear. Other: None. IMPRESSION: Acute fracture of the left side of the C7 vertebral body extends into the left facet and base of the left transverse process. No other acute abnormality is identified. 0.4 cm anterolisthesis C7 on T1 is unchanged. Status post C4-6 and C7-T1 fusion. No acute intracranial normality. Electronically Signed   By: Inge Rise M.D.   On: 01/21/2018 15:32   Ct Cervical Spine Wo Contrast  Result Date: 01/21/2018 CLINICAL DATA:   Patient status post C7-T1 fusion 10/24/2017. History of prior C4-6 fusion. Status post fall off a porch today. Limited range of motion of the head and new tingling in the left fingers since the fall. Initial encounter. EXAM: CT HEAD WITHOUT CONTRAST CT CERVICAL SPINE WITHOUT CONTRAST TECHNIQUE: Multidetector CT imaging of the head and cervical spine was performed following the standard protocol without intravenous contrast. Multiplanar CT image reconstructions of the cervical spine were also generated. COMPARISON:  Brain MRI 01/12/2018. Cervical spine CT scan 12/03/2017. Head CT 09/21/2017. FINDINGS: CT HEAD FINDINGS Brain: No evidence of acute infarction, hemorrhage, hydrocephalus, extra-axial collection or mass lesion/mass effect. Vascular: No hyperdense vessel  or unexpected calcification. Skull: Intact.  No focal lesion. Sinuses/Orbits: Small mucous retention cyst or polyp right maxillary sinus is unchanged. The patient is status post cataract surgery bilaterally. Other: None. CT CERVICAL SPINE FINDINGS Alignment: Approximately 0.4 cm anterolisthesis C7 on T1 is unchanged since the prior CT. Alignment is otherwise maintained. Skull base and vertebrae: The patient has an acute fracture of C7. The fracture extends from the left side of the vertebral body into the left C7 facet and base of the left transverse process. The fracture is minimally displaced at 1-2 mm. No other fracture is identified. The interbody spacer and C7 appears to be incorporating into the endplates. Soft tissues and spinal canal: No epidural hematoma is identified. Disc levels: Loss of disc space height and endplate spurring at C1-2 appear unchanged. Upper chest: Lung apices are clear. Other: None. IMPRESSION: Acute fracture of the left side of the C7 vertebral body extends into the left facet and base of the left transverse process. No other acute abnormality is identified. 0.4 cm anterolisthesis C7 on T1 is unchanged. Status post C4-6 and  C7-T1 fusion. No acute intracranial normality. Electronically Signed   By: Inge Rise M.D.   On: 01/21/2018 15:32   Mr Jodene Nam Head Wo Contrast  Result Date: 01/13/2018  [facility] [address], [place], [place] [address] [phone] NEUROIMAGING REPORT STUDY DATE: 01/12/2018 PATIENT NAME: ABBYGAIL WILLHOITE DOB: [birth year] MRN: [id number] EXAM: MR angiogram of the intracranial arteries ORDERING CLINICIAN: Andrey Spearman MD CLINICAL HISTORY: 77 year old woman with vertigo and vertebrobasilar TIAs COMPARISON FILMS: None TECHNIQUE: MR angiogram of the head was obtained utilizing 3D time of flight sequences from below the vertebrobasilar junction up to the intracranial vasculature without contrast.  Computerized reconstructions were obtained. CONTRAST: None IMAGING SITE: [facility], [place], [place], [place] FINDINGS: The imaged extracranial and intracranial portions of the internal carotid arteries appear normal. The middle cerebral appear normal.  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   In the posterior circulation, the left vertebral artery is mildly dominant.  No stenosis is noted within the vertebral arteries and the basilar arteries.  Posterior communicating arteries are noted bilaterally, right greater than left.  The right posterior artery derives most of its flow from the anterior circulation through the right posterior communicating artery.  The posterior cerebral arteries do not appear to have stenosis.. No aneurysms were identified.   This MR angiogram of the intracranial arteries shows the following: 1.    Normal variant anatomy with an absent A1 segment of the right anterior cerebral artery.  There is a fetal origin of the right posterior cerebral artery. 2.    No significant arterial stenosis.  No aneurysms.  INTERPRETING  PHYSICIAN: Richard A. Felecia Shelling, MD, PhD, FAAN Certified in  Neuroimaging by  [employer]   Mr Jodene Nam Neck W Wo Contrast  Result Date: 01/13/2018  [facility] [address] [place], [place] [address] [phone] NEUROIMAGING REPORT STUDY DATE: 01/12/2018 PATIENT NAME: MIEKE BRINLEY DOB: 1940-11-06 MRN: [id number] EXAM: MR Angiogram of the Neck Arteries with and without contrast ORDERING CLINICIAN: Andrey Spearman MD CLINICAL HISTORY: 77 year old woman with vertebrobasilar TIAs COMPARISON FILMS: None TECHNIQUE: MR angiogram of the neck was obtained utilizing 3D time-of-flight sequences from the aortic arch up to the intracranial vasculature postbolus contrast infusion.  2D time-of-flight noncontrast views and computerized  reconstructions were obtained. CONTRAST: 19 mL [facility] IMAGING SITE: [facility], [place], [place], [place] FINDINGS: This study is of adequate technical quality.  There is anterograde flow in the bilateral vertebral and carotid arteries on 2D-TOF views.  The flow signal of the left subclavian artery has no stenosis.  The left common and external carotid arteries have no stenosis.  There is minimal (10 to 15%) stenosis at the origin of the left internal carotid artery.  This is hemodynamically significant.  The left vertebral artery is mildly smaller than the right.  The origin appears normal.  There is reduced flow signal in the proximal cervical segment that is most likely artifactual.  The more distal left vertebral artery has no stenosis up to the vertebrobasilar junction.  On the right, the brachiocephalic trunk and subclavian arteries have no stenosis. The right common, internal and external carotid arteries have no stenosis. The right vertebral artery has no stenosis from its origin to the vertebrobasilar junction. Limited views of the intracranial vasculature are unremarkable.   This MR angiogram of the neck arteries shows the  following: 1.    Although there is reduced flow signal in the proximal cervical segment of the left vertebral artery on the source and reconstructed images, this is most likely artifactual as there is completely normal flow more distally. 2.    Minimal (less than 15%) stenosis at the origin of the left internal carotid artery.  This is not hemodynamically significant.  3.    Other arteries appear normal. INTERPRETING PHYSICIAN: Richard A. Felecia Shelling, MD, PhD, FAAN Certified in  Neuroimaging by [place] [employer]   Mr Cervical Spine Wo Contrast  Result Date: 01/22/2018 CLINICAL DATA:  Fall with left-sided vertebral body and posterior element fractures at C7. Prior cervical fusion. Sudden lower extremity weakness. EXAM: MRI CERVICAL SPINE WITHOUT CONTRAST TECHNIQUE: Multiplanar, multisequence MR imaging of the cervical spine was performed. No intravenous contrast was administered. COMPARISON:  Cervical spine CT 01/21/2018 and MRI 08/07/2017 FINDINGS: Alignment: Grade 1 anterolisthesis of C6 on C7 and C7 on T1, unchanged. Vertebrae: Bilateral C7 facet edema with a left-sided fracture shown on yesterday's CT. Fracture component involving the C7 vertebral body not well demonstrated by MRI with evaluation limited by artifact from C4-T1 anterior fusion plates and screws. Cord: T2 hyperintensity in the cord at C6-7 with cord expansion consistent with edema. Suspected focal hemorrhage along the posterior margin of the cord at C7 (series 8, image 26), uncertain if this is intramedullary or extramedullary in location. Noncompressive dorsal epidural hematoma throughout the cervical and included upper thoracic spine. Posterior Fossa, vertebral arteries, paraspinal tissues: Extensive edema and hematoma throughout the musculature and other soft tissues of the posterior neck bilaterally. STIR hyperintensity in the interspinous soft tissues at C6-7 with possible disruption of the ligamentum flavum. Disc levels: C2-3:  Mild facet arthrosis and tiny central disc protrusion without stenosis, unchanged from the prior MRI. C3-4: Broad-based posterior disc osteophyte complex and minimal facet arthrosis result in mild spinal stenosis and moderate to severe right and moderate left neural foraminal stenosis, unchanged from the prior MRI. C4-5: Prior ACDF.  No significant stenosis. C5-6: Prior ACDF.  No significant stenosis. C6-7: Prior ACDF. Slight anterolisthesis with epidural hematoma. Mild spinal stenosis. C7-T1: ACDF. Anterolisthesis and facet arthrosis result in moderate bilateral neural foraminal stenosis. Epidural hematoma with mild spinal stenosis. T1-2: Small right central disc protrusion, unchanged from the prior MRI and slightly deforming the ventral spinal cord. Unchanged mild left neural foraminal stenosis.  T2-3: Mild disc bulging with borderline bilateral neural foraminal stenosis and no spinal stenosis. T3-4: Small central disc extrusion mildly deforming the ventral spinal cord, similar to the prior MRI. T5-6 and T6-7: Only imaged sagittally. Small central disc herniations at both levels contacting and at most slightly deforming the spinal cord. IMPRESSION: 1. C6-7 spinal cord edema with focal dorsal intramedullary versus extramedullary/epidural hemorrhage posteriorly, favor the latter. 2. Extensive edema and hemorrhage throughout the posterior neck soft tissues. Possible posterior ligamentous complex injury at C6-7. 3. C7 vertebral and posterior element fracture better demonstrated on CT. 4. Noncompressive dorsal epidural hematoma throughout the cervical and visualized upper thoracic spine. These results were called by telephone at the time of interpretation on 01/22/2018 at 3:13 pm to Dr. [name], who verbally acknowledged these results. Electronically Signed   By: Logan Bores M.D.   On: 01/22/2018 15:49   Dg Shoulder Left  Result Date: 01/21/2018 CLINICAL DATA:  [address] down stairs EXAM: LEFT SHOULDER - 2+ VIEW  COMPARISON:  None. FINDINGS: There is no evidence of fracture or dislocation. There is no evidence of arthropathy or other focal bone abnormality. Soft tissues are unremarkable. IMPRESSION: Negative. Electronically Signed   By: Franchot Gallo M.D.   On: 01/21/2018 15:00   Dg C-arm 1-60 Min  Result Date: 01/22/2018 CLINICAL DATA:  Cervical spine decompression and fusion. EXAM: CERVICAL SPINE - 2-3 VIEW; DG C-ARM 61-120 MIN COMPARISON:  None. FINDINGS: Two C-arm views of the cervical spine demonstrate pedicle screw and rod an plate and screw fusion at multiple levels. The bone details are not adequately visualized to determine the levels involved. IMPRESSION: Operative changes, as described above. Electronically Signed   By: Claudie Revering M.D.   On: 01/22/2018 22:00   Mr Brain/iac W Wo Contrast  Result Date: 01/13/2018  [facility] [address] [place], [place] [address] [phone] NEUROIMAGING REPORT STUDY DATE: 01/12/2018 PATIENT NAME: ANACAREN KOHAN DOB: 05-25-1940 MRN: [id number] EXAM: MRI Brain with and without contrast ORDERING CLINICIAN: Andrey Spearman MD CLINICAL HISTORY: 77 year old woman with vertigo COMPARISON FILMS: None TECHNIQUE:MRI of the brain with and without contrast was obtained utilizing 5 mm axial slices with T1, T2, T2 flair, SWI and diffusion weighted views.  T1 sagittal and postcontrast views in the axial and coronal plane were obtained.  Additional thin section pre-and post contrasted axial and coronal T1-weighted and axial  CISS images through the internal auditory canals were performed. CONTRAST: 19 ml [employer] IMAGING SITE: [facility], [place]. FINDINGS: On sagittal images, the spinal cord is imaged caudally to C3 and is normal in caliber.   The contents of the posterior fossa are of normal size and position.   The pituitary gland and optic chiasm appear normal.   The third and lateral ventricles are mildly enlarged, in  proportion to the extent of mild generalized cortical atrophy.  There are no abnormal extra-axial collections of fluid.  There are T2/FLAIR hyperintense foci in the pons.  The cerebellum and deep gray matter appears normal.  In the hemispheres, there are some scattered T2/FLAIR hyperintense foci in the subcortical and deep white matter.  None of the foci appears to be acute.   Diffusion weighted images are normal.  Susceptibility weighted images are normal.   The VIIth/VIIIth nerve complexes appear normal before and after contrast.  There have been bilateral lens replacements.  The orbits are otherwise normal.  The mastoid air cells appear normal.  The paranasal sinuses appear normal.  Flow voids are identified within the major intracerebral arteries.  After the infusion of contrast material, a normal enhancement pattern is noted.   This MRI of the brain with and without contrast with added attention to the internal auditory canal shows the following: 1.     T2/FLAIR hypertense foci in the pons and hemispheres consistent with chronic microvascular ischemic change.  None of the foci appears to be acute. 2.     Mild generalized cortical atrophy. 3.     The internal auditory canals appear normal before and after contrast. INTERPRETING PHYSICIAN: Richard A. Felecia Shelling, MD, PhD, FAAN Certified in  Neuroimaging by [employer]    Time Spent in minutes  30   Lala Lund M.D on 01/26/2018 at 8:45 AM  To page go to [email or web address] - password [facility]

## 2018-01-26 NOTE — Progress Notes (Signed)
RN unable to assess honeycomb dressing due to patient complaining of pain whenever being turned and discomfort with C-Collar. RN to continue to monitor.

## 2018-01-27 LAB — BPAM RBC
Blood Product Expiration Date: 201912292359
Blood Product Expiration Date: 202001032359
Unit Type and Rh: 600
Unit Type and Rh: 600

## 2018-01-27 LAB — TYPE AND SCREEN
ABO/RH(D): A NEG
Antibody Screen: POSITIVE
Unit division: 0
Unit division: 0

## 2018-01-27 LAB — GLUCOSE, CAPILLARY
Glucose-Capillary: 120 mg/dL — ABNORMAL HIGH (ref 70–99)
Glucose-Capillary: 125 mg/dL — ABNORMAL HIGH (ref 70–99)

## 2018-01-27 NOTE — Progress Notes (Signed)
Upper extremity function continues to improve along expected pattern with recovery of proximal before distal strength. Right grip still minimal but can now lift R arm over her head, which she states is an improvement. Moving both legs well with proximal better than distal strength.  -continue supportive care. If dysesthetic pain becomes bothersome, can try gabapentin.

## 2018-01-27 NOTE — Progress Notes (Signed)
CBG 158  

## 2018-01-27 NOTE — Progress Notes (Signed)
PROGRESS NOTE                                                                                                                                                                                                             Patient Demographics:    [name], is a 77 y.o. female, DOB - July 20, 1940, CBS:[id number]  Admit date - 01/21/2018   Admitting Physician [name], MD  Outpatient Primary MD for the patient is [name], Malka So, MD  LOS - 5  Chief Complaint  Patient presents with  . Fall       Brief Narrative  [name] is a 77 y.o. female with history of SVT, hypertension, fibromyalgia, diabetes mellitus had a fall at home while she was trying to open her door.  She struck her head when she fell from a porch.  Denied losing consciousness, nochest pain or palpitations.  He came to the ER where a C-spine CT scan showed an acute C7 fracture, Dr. [name] neurosurgeon on-call was consulted by the ER physician who advised a c-collar which was placed, she was subsequently admitted to the hospital.  Early this morning patient requested that she be placed in the chair as she was not comfortable in the bed, she was placed in the chair with assistance, she did fine however after about 45 minutes of sitting up in the chair she reported that she is unable to move her legs further work-up showed that she had bled into her spinal cord space and she was urgently taken to the OR by neurosurgeon Dr. [name].    Subjective:   Patient in bed, appears comfortable, denies any headache, no fever, no chest pain or pressure, no shortness of breath , no abdominal pain. No focal weakness.    Assessment  & Plan :     1.  Mechanical fall, acute C7 fracture.  Complicated by spinal cord hematoma and edema causing cord compression.  Patient had chronic upper extremity weakness right greater than left and has now developed lateral lower extremity weakness with some continued improvement  since her decompression surgery right leg now 4/5 left leg 3/5.  She is in c-collar, she was emergently taken to the OR by Dr. [name] on 01/22/2018 and underwent Posterior decompression with laminectomy C6-C7, posterior fixation C4-T1 with T1 pedicle screws and C4-C7 facet screws bilaterally posterior lateral arthrodesis with local autograft C6 and C7.  She required 2 units of packed RBC during the surgery in the OR  thereafter H&H is stable, has tolerated the procedure well, continue PT OT, extremity strength has significantly improved still qualifies for SNF will likely discharge tomorrow updated daughter bedside.  2.  Multiple C-spine surgeries last CT 7 to T1 fusion in September of this year, multiple falls at home.  Had detailed discussion with patient's daughter bedside, patient has had several falls over the last several months and she had been recommended going to rehab multiple times but patient had been refusing it and been noncompliant with recommendations.  Will require SNF.   3.  Chronic bilateral upper extremity right more than left weakness.  Continue PT/OT, will require placement.  4.  History of SVT.  On metoprolol, verapamil and digoxin combination which will be continued.  5.  HX of restless leg syndrome along with peripheral neuropathy.  On Requip continue.  6.  Essential hypertension.  On combination of metoprolol and ACE inhibitor along with verapamil.  Continue and monitor.  7.  Chronic diastolic CHF EF 46% on echocardiogram done in November 2019.  Mildly elevated troponin and nonspecific non-ACS pattern.  No chest pain, no shortness of breath, EKG nonacute.  Continue combination of aspirin and beta-blocker for secondary prevention and monitor.  No acute issues.   8.  GERD.  On PPI.   9.  UTI.  Pleated her Keflex course.  10.  Hyponatremia.  This was SIADH improved with fluid restriction.   11. Ur.Retention and Constipation -based on aggressive bowel regimen, will give an  additional suppository today as well, failed Foley removal, Foley reinserted on 01/25/2018, Flomax started.  Will monitor.    12.  Hypomagnesemia.  Replaced and stable.    13. DM type II.  On Lantus twice daily along with sliding scale will monitor.  CBGs stable for now.  CBG (last 3)  Recent Labs    01/26/18 1634 01/26/18 2128 01/27/18 0824  GLUCAP 176* 142* 120*      Family Communication  :  Daughter bedside  Code Status :  Full  Disposition Plan  :  TBD  Consults  :  N.Surg  Procedures  :    01/22/18 by Dr [name] - Posterior decompression with laminectomy C6-C7, posterior fixation C4-T1 with T1 pedicle screws and C4-C7 facet screws bilaterally posterior lateral arthrodesis with local autograft C6 and C7.  CT C spine - Acute fracture of the left side of the C7 vertebral body extends into the left facet and base of the left transverse process. No other acute abnormality is identified. 0.4 cm anterolisthesis C7 on T1 is unchanged. Status post C4-6 and C7-T1 fusion.  MRI C Spine - 1. C6-7 spinal cord edema with focal dorsal intramedullary versus extramedullary/epidural hemorrhage posteriorly, favor the latter. 2. Extensive edema and hemorrhage throughout the posterior neck soft tissues. Possible posterior ligamentous complex injury at C6-7. 3. C7 vertebral and posterior element fracture better demonstrated on CT. 4. Noncompressive dorsal epidural hematoma throughout the cervical and visualized upper thoracic spine. These results were called by telephone at the time of interpretation on 01/22/2018 at 3:13 pm to Dr. [name], who verbally acknowledged these results.  TTE Nov 2019 -   Left ventricle: The cavity size was normal. Systolic function was  normal. The estimated ejection fraction was in the range of 60% to 65%. Wall motion was normal; there were no regional wall motion abnormalities. Left ventricular diastolic function parameters were normal for the patient&'s age. -  Aortic valve: Trileaflet; mildly thickened, mildly calcified leaflets. Left coronary cusp predominanly  affected by calcification. Sclerosis without stenosis. There was no   regurgitation. - Mitral valve: There was no significant regurgitation. - Right ventricle: Systolic function was normal. - Atrial septum: No defect or patent foramen ovale was identified. - Tricuspid valve: There was no significant regurgitation. - Pulmonic valve: There was no significant regurgitation.   DVT Prophylaxis  : SCDs   Lab Results  Component Value Date   PLT 167 01/25/2018    Diet :  Diet Order            DIET SOFT Room service appropriate? Yes; Fluid consistency: Thin  Diet effective now               Inpatient Medications Scheduled Meds: . digoxin  0.125 mg Oral Daily  . docusate sodium  100 mg Oral BID  . insulin aspart  0-9 Units Subcutaneous TID WC  . insulin glargine  25 Units Subcutaneous BID  . metoprolol succinate  25 mg Oral Daily  . pantoprazole  40 mg Oral Daily  . polyethylene glycol  17 g Oral BID  . rOPINIRole  1 mg Oral QHS  . senna  1 tablet Oral BID  . tamsulosin  0.4 mg Oral Daily  . verapamil  240 mg Oral Daily   Continuous Infusions: . lactated ringers 10 mL/hr at 01/27/18 0859  . methocarbamol (ROBAXIN) IV 500 mg (01/23/18 1051)   PRN Meds:.acetaminophen **OR** acetaminophen, alum & mag hydroxide-simeth, bisacodyl, diltiazem, diphenhydrAMINE, hydrALAZINE, LORazepam, methocarbamol **OR** methocarbamol (ROBAXIN) IV, metoprolol tartrate, morphine injection, naphazoline-glycerin, nitroGLYCERIN, [DISCONTINUED] ondansetron **OR** ondansetron (ZOFRAN) IV, oxyCODONE-acetaminophen, sodium chloride, sodium phosphate  Antibiotics  :   Anti-infectives (From admission, onward)   Start     Dose/Rate Route Frequency Ordered Stop   01/24/18 0600  vancomycin (VANCOCIN) 1,250 mg in sodium chloride 0.9 % 250 mL IVPB  Status:  Discontinued     1,250 mg 166.7 mL/hr over 90 Minutes  Intravenous Every 24 hours 01/23/18 1056 01/26/18 1158   01/23/18 0600  vancomycin (VANCOCIN) 1,750 mg in sodium chloride 0.9 % 500 mL IVPB  Status:  Discontinued     1,750 mg 250 mL/hr over 120 Minutes Intravenous Every 24 hours 01/23/18 0012 01/23/18 1056   01/22/18 2020  bacitracin 50,000 Units in sodium chloride 0.9 % 500 mL irrigation  Status:  Discontinued       As needed 01/22/18 2021 01/22/18 2216   01/22/18 1800  cephALEXin (KEFLEX) capsule 500 mg     500 mg Oral Every 12 hours 01/22/18 0945 01/25/18 0939   01/22/18 0600  ciprofloxacin (CIPRO) IVPB 400 mg  Status:  Discontinued     400 mg 200 mL/hr over 60 Minutes Intravenous Every 12 hours 01/21/18 2237 01/22/18 0906   01/21/18 1845  ciprofloxacin (CIPRO) IVPB 400 mg     400 mg 200 mL/hr over 60 Minutes Intravenous  Once 01/21/18 1832 01/21/18 2033          Objective:   Vitals:   01/26/18 1511 01/26/18 2126 01/27/18 0529 01/27/18 0834  BP: (!) 119/53 137/61 (!) 146/72 (!) 177/78  Pulse: 67 72 73 72  Resp: 16   18  Temp: 99.6 F (37.6 C) 99.6 F (37.6 C) 98 F (36.7 C) 98.9 F (37.2 C)  TempSrc: Oral   Oral  SpO2: 94% 93% 96% 96%  Weight:      Height:        Wt Readings from Last 3 Encounters:  01/22/18 94.3 kg  12/28/17 94.3 kg  12/21/17  94.3 kg     Intake/Output Summary (Last 24 hours) at 01/27/2018 0958 Last data filed at 01/27/2018 0900 Gross per 24 hour  Intake 1385.75 ml  Output 6400 ml  Net -5014.25 ml     Physical Exam  Awake Alert, wearing c-collar, chronic upper extremity weakness with right more than left, lower extremity right leg 4/5 left leg 3/5 some improvement, foley in place [place].AT,PERRAL Supple Neck,No JVD, No cervical lymphadenopathy appriciated.  Symmetrical Chest wall movement, Good air movement bilaterally, CTAB RRR,No Gallops, Rubs or new Murmurs, No Parasternal Heave +ve B.Sounds, Abd Soft, No tenderness, No organomegaly appriciated, No rebound - guarding or rigidity. No  Cyanosis, Clubbing or edema, No new Rash or bruise   Data Review:    CBC Recent Labs  Lab 01/21/18 1612 01/22/18 0042 01/23/18 0327 01/24/18 0335 01/25/18 0401  WBC 19.0* 10.2 11.7* 8.8 9.1  HGB 13.8 12.8 10.4* 9.9* 10.4*  HCT 42.6 38.8 31.5* 31.5* 32.0*  PLT 175 181 164 159 167  MCV 87.3 85.7 86.3 89.2 87.4  MCH 28.3 28.3 28.5 28.0 28.4  MCHC 32.4 33.0 33.0 31.4 32.5  RDW 13.1 13.2 13.2 13.6 13.4  LYMPHSABS 1.1  --   --   --   --   MONOABS 0.9  --   --   --   --   EOSABS 0.0  --   --   --   --   BASOSABS 0.1  --   --   --   --     Chemistries  Recent Labs  Lab 01/21/18 1612 01/22/18 0042 01/23/18 0327 01/24/18 0335 01/25/18 0401  NA 136 133* 130* 134* 134*  K 4.1 4.4 4.4 4.2 4.2  CL 102 98 98 102 100  CO2 21* 22 24 24 24   GLUCOSE 204* 189* 189* 112* 123*  BUN 22 19 20 19 16   CREATININE 0.84 0.84 0.92 0.81 0.86  CALCIUM 9.6 9.1 8.4* 8.3* 8.5*  MG  --  1.6*  --   --  2.1   ------------------------------------------------------------------------------------------------------------------ No results for input(s): CHOL, HDL, LDLCALC, TRIG, CHOLHDL, LDLDIRECT in the last 72 hours.  Lab Results  Component Value Date   HGBA1C 7.6 (H) 11/12/2017   ------------------------------------------------------------------------------------------------------------------ No results for input(s): TSH, T4TOTAL, T3FREE, THYROIDAB in the last 72 hours.  Invalid input(s): FREET3 ------------------------------------------------------------------------------------------------------------------ No results for input(s): VITAMINB12, FOLATE, FERRITIN, TIBC, IRON, RETICCTPCT in the last 72 hours.  Coagulation profile Recent Labs  Lab 01/21/18 1612  INR 0.94    No results for input(s): DDIMER in the last 72 hours.  Cardiac Enzymes Recent Labs  Lab 01/22/18 0042 01/22/18 0645 01/22/18 1114  TROPONINI 0.59* 0.54* 0.36*    ------------------------------------------------------------------------------------------------------------------    Component Value Date/Time   BNP 10.6 02/14/2015 0341    Micro Results Recent Results (from the past 240 hour(s))  Surgical pcr screen     Status: None   Collection Time: 01/22/18  5:48 PM  Result Value Ref Range Status   MRSA, PCR NEGATIVE NEGATIVE Final   Staphylococcus aureus NEGATIVE NEGATIVE Final    Comment: (NOTE) The Xpert SA Assay (FDA approved for NASAL specimens in patients 37 years of age and older), is one component of a comprehensive surveillance program. It is not intended to diagnose infection nor to guide or monitor treatment. Performed at [facility], [place] [address], [place], [place] [address]     Radiology Reports Dg Cervical Spine 2-3 Views  Result Date: 01/22/2018 CLINICAL DATA:  Cervical spine  decompression and fusion. EXAM: CERVICAL SPINE - 2-3 VIEW; DG C-ARM 61-120 MIN COMPARISON:  None. FINDINGS: Two C-arm views of the cervical spine demonstrate pedicle screw and rod an plate and screw fusion at multiple levels. The bone details are not adequately visualized to determine the levels involved. IMPRESSION: Operative changes, as described above. Electronically Signed   By: Claudie Revering M.D.   On: 01/22/2018 22:00   Dg Shoulder Right  Result Date: 01/21/2018 CLINICAL DATA:  Fall EXAM: RIGHT SHOULDER - 2+ VIEW COMPARISON:  07/19/2011 FINDINGS: Negative for fracture or dislocation. Mild degenerative change in the [facility] joint and shoulder joint. Mild associated spurring. IMPRESSION: Negative for fracture or dislocation. Electronically Signed   By: Franchot Gallo M.D.   On: 01/21/2018 14:59   Ct Head Wo Contrast  Result Date: 01/21/2018 CLINICAL DATA:  Patient status post C7-T1 fusion 10/24/2017. History of prior C4-6 fusion. Status post fall off a porch today. Limited range of motion of the head and new tingling in the left fingers since the  fall. Initial encounter. EXAM: CT HEAD WITHOUT CONTRAST CT CERVICAL SPINE WITHOUT CONTRAST TECHNIQUE: Multidetector CT imaging of the head and cervical spine was performed following the standard protocol without intravenous contrast. Multiplanar CT image reconstructions of the cervical spine were also generated. COMPARISON:  Brain MRI 01/12/2018. Cervical spine CT scan 12/03/2017. Head CT 09/21/2017. FINDINGS: CT HEAD FINDINGS Brain: No evidence of acute infarction, hemorrhage, hydrocephalus, extra-axial collection or mass lesion/mass effect. Vascular: No hyperdense vessel or unexpected calcification. Skull: Intact.  No focal lesion. Sinuses/Orbits: Small mucous retention cyst or polyp right maxillary sinus is unchanged. The patient is status post cataract surgery bilaterally. Other: None. CT CERVICAL SPINE FINDINGS Alignment: Approximately 0.4 cm anterolisthesis C7 on T1 is unchanged since the prior CT. Alignment is otherwise maintained. Skull base and vertebrae: The patient has an acute fracture of C7. The fracture extends from the left side of the vertebral body into the left C7 facet and base of the left transverse process. The fracture is minimally displaced at 1-2 mm. No other fracture is identified. The interbody spacer and C7 appears to be incorporating into the endplates. Soft tissues and spinal canal: No epidural hematoma is identified. Disc levels: Loss of disc space height and endplate spurring at S0-1 appear unchanged. Upper chest: Lung apices are clear. Other: None. IMPRESSION: Acute fracture of the left side of the C7 vertebral body extends into the left facet and base of the left transverse process. No other acute abnormality is identified. 0.4 cm anterolisthesis C7 on T1 is unchanged. Status post C4-6 and C7-T1 fusion. No acute intracranial normality. Electronically Signed   By: Inge Rise M.D.   On: 01/21/2018 15:32   Ct Cervical Spine Wo Contrast  Result Date: 01/21/2018 CLINICAL DATA:   Patient status post C7-T1 fusion 10/24/2017. History of prior C4-6 fusion. Status post fall off a porch today. Limited range of motion of the head and new tingling in the left fingers since the fall. Initial encounter. EXAM: CT HEAD WITHOUT CONTRAST CT CERVICAL SPINE WITHOUT CONTRAST TECHNIQUE: Multidetector CT imaging of the head and cervical spine was performed following the standard protocol without intravenous contrast. Multiplanar CT image reconstructions of the cervical spine were also generated. COMPARISON:  Brain MRI 01/12/2018. Cervical spine CT scan 12/03/2017. Head CT 09/21/2017. FINDINGS: CT HEAD FINDINGS Brain: No evidence of acute infarction, hemorrhage, hydrocephalus, extra-axial collection or mass lesion/mass effect. Vascular: No hyperdense vessel or unexpected calcification. Skull: Intact.  No focal lesion. Sinuses/Orbits:  Small mucous retention cyst or polyp right maxillary sinus is unchanged. The patient is status post cataract surgery bilaterally. Other: None. CT CERVICAL SPINE FINDINGS Alignment: Approximately 0.4 cm anterolisthesis C7 on T1 is unchanged since the prior CT. Alignment is otherwise maintained. Skull base and vertebrae: The patient has an acute fracture of C7. The fracture extends from the left side of the vertebral body into the left C7 facet and base of the left transverse process. The fracture is minimally displaced at 1-2 mm. No other fracture is identified. The interbody spacer and C7 appears to be incorporating into the endplates. Soft tissues and spinal canal: No epidural hematoma is identified. Disc levels: Loss of disc space height and endplate spurring at A4-1 appear unchanged. Upper chest: Lung apices are clear. Other: None. IMPRESSION: Acute fracture of the left side of the C7 vertebral body extends into the left facet and base of the left transverse process. No other acute abnormality is identified. 0.4 cm anterolisthesis C7 on T1 is unchanged. Status post C4-6 and  C7-T1 fusion. No acute intracranial normality. Electronically Signed   By: Inge Rise M.D.   On: 01/21/2018 15:32   Mr Jodene Nam Head Wo Contrast  Result Date: 01/13/2018  [facility] [address], [place], [place] [address] [phone] NEUROIMAGING REPORT STUDY DATE: 01/12/2018 PATIENT NAME: IVADELL GAUL DOB: 1940/09/27 MRN: [id number] EXAM: MR angiogram of the intracranial arteries ORDERING CLINICIAN: Andrey Spearman MD CLINICAL HISTORY: 77 year old woman with vertigo and vertebrobasilar TIAs COMPARISON FILMS: None TECHNIQUE: MR angiogram of the head was obtained utilizing 3D time of flight sequences from below the vertebrobasilar junction up to the intracranial vasculature without contrast.  Computerized reconstructions were obtained. CONTRAST: None IMAGING SITE: [facility], [place], [place], [place] FINDINGS: The imaged extracranial and intracranial portions of the internal carotid arteries appear normal. The middle cerebral appear normal.  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   In the posterior circulation, the left vertebral artery is mildly dominant.  No stenosis is noted within the vertebral arteries and the basilar arteries.  Posterior communicating arteries are noted bilaterally, right greater than left.  The right posterior artery derives most of its flow from the anterior circulation through the right posterior communicating artery.  The posterior cerebral arteries do not appear to have stenosis.. No aneurysms were identified.   This MR angiogram of the intracranial arteries shows the following: 1.    Normal variant anatomy with an absent A1 segment of the right anterior cerebral artery.  There is a fetal origin of the right posterior cerebral artery. 2.    No significant arterial stenosis.  No aneurysms.  INTERPRETING  PHYSICIAN: Richard A. Felecia Shelling, MD, PhD, FAAN Certified in  Neuroimaging by [employer]   Mr Jodene Nam Neck W Wo Contrast  Result Date: 01/13/2018  [facility] [address], [place], [place] [address] [phone] NEUROIMAGING REPORT STUDY DATE: 01/12/2018 PATIENT NAME: AESHA AGRAWAL DOB: 10-27-1940 MRN: [id number] EXAM: MR Angiogram of the Neck Arteries with and without contrast ORDERING CLINICIAN: Andrey Spearman MD CLINICAL HISTORY: 77 year old woman with vertebrobasilar TIAs COMPARISON FILMS: None TECHNIQUE: MR angiogram of the neck was obtained utilizing 3D time-of-flight sequences from the aortic arch up to the intracranial vasculature postbolus contrast infusion.  2D time-of-flight noncontrast views and computerized reconstructions were obtained. CONTRAST: 19 mL [facility] IMAGING SITE: [facility], [address] [place], [place] FINDINGS: This study is of adequate technical quality.  There is anterograde flow in the bilateral vertebral and carotid arteries on 2D-TOF views.  The flow signal of the left subclavian artery has no stenosis.  The left common and external carotid arteries have no stenosis.  There is minimal (10 to 15%) stenosis at the origin of the left internal carotid artery.  This is hemodynamically significant.  The left vertebral artery is mildly smaller than the right.  The origin appears normal.  There is reduced flow signal in the proximal cervical segment that is most likely artifactual.  The more distal left vertebral artery has no stenosis up to the vertebrobasilar junction.  On the right, the brachiocephalic trunk and subclavian arteries have no stenosis. The right common, internal and external carotid arteries have no stenosis. The right vertebral artery has no stenosis from its origin to the vertebrobasilar junction. Limited views of the intracranial vasculature are unremarkable.   This MR angiogram of the neck arteries shows the  following: 1.    Although there is reduced flow signal in the proximal cervical segment of the left vertebral artery on the source and reconstructed images, this is most likely artifactual as there is completely normal flow more distally. 2.    Minimal (less than 15%) stenosis at the origin of the left internal carotid artery.  This is not hemodynamically significant.  3.    Other arteries appear normal. INTERPRETING PHYSICIAN: Richard A. Felecia Shelling, MD, PhD, FAAN Certified in  Neuroimaging by  [employer]   Mr Cervical Spine Wo Contrast  Result Date: 01/22/2018 CLINICAL DATA:  Fall with left-sided vertebral body and posterior element fractures at C7. Prior cervical fusion. Sudden lower extremity weakness. EXAM: MRI CERVICAL SPINE WITHOUT CONTRAST TECHNIQUE: Multiplanar, multisequence MR imaging of the cervical spine was performed. No intravenous contrast was administered. COMPARISON:  Cervical spine CT 01/21/2018 and MRI 08/07/2017 FINDINGS: Alignment: Grade 1 anterolisthesis of C6 on C7 and C7 on T1, unchanged. Vertebrae: Bilateral C7 facet edema with a left-sided fracture shown on yesterday's CT. Fracture component involving the C7 vertebral body not well demonstrated by MRI with evaluation limited by artifact from C4-T1 anterior fusion plates and screws. Cord: T2 hyperintensity in the cord at C6-7 with cord expansion consistent with edema. Suspected focal hemorrhage along the posterior margin of the cord at C7 (series 8, image 26), uncertain if this is intramedullary or extramedullary in location. Noncompressive dorsal epidural hematoma throughout the cervical and included upper thoracic spine. Posterior Fossa, vertebral arteries, paraspinal tissues: Extensive edema and hematoma throughout the musculature and other soft tissues of the posterior neck bilaterally. STIR hyperintensity in the interspinous soft tissues at C6-7 with possible disruption of the ligamentum flavum. Disc levels: C2-3:  Mild facet arthrosis and tiny central disc protrusion without stenosis, unchanged from the prior MRI. C3-4: Broad-based posterior disc osteophyte complex and minimal facet arthrosis result in mild spinal stenosis and moderate to severe right and moderate left neural foraminal stenosis, unchanged from the prior MRI. C4-5: Prior ACDF.  No significant stenosis. C5-6: Prior ACDF.  No significant stenosis. C6-7: Prior ACDF. Slight anterolisthesis with epidural hematoma. Mild spinal stenosis. C7-T1: ACDF. Anterolisthesis and facet arthrosis result in moderate bilateral neural foraminal stenosis. Epidural hematoma with mild spinal stenosis. T1-2: Small right central disc protrusion, unchanged from the prior MRI and slightly deforming the ventral spinal cord. Unchanged mild left neural foraminal stenosis. T2-3: Mild disc bulging with borderline bilateral neural foraminal stenosis  and no spinal stenosis. T3-4: Small central disc extrusion mildly deforming the ventral spinal cord, similar to the prior MRI. T5-6 and T6-7: Only imaged sagittally. Small central disc herniations at both levels contacting and at most slightly deforming the spinal cord. IMPRESSION: 1. C6-7 spinal cord edema with focal dorsal intramedullary versus extramedullary/epidural hemorrhage posteriorly, favor the latter. 2. Extensive edema and hemorrhage throughout the posterior neck soft tissues. Possible posterior ligamentous complex injury at C6-7. 3. C7 vertebral and posterior element fracture better demonstrated on CT. 4. Noncompressive dorsal epidural hematoma throughout the cervical and visualized upper thoracic spine. These results were called by telephone at the time of interpretation on 01/22/2018 at 3:13 pm to Dr. [name], who verbally acknowledged these results. Electronically Signed   By: Logan Bores M.D.   On: 01/22/2018 15:49   Dg Shoulder Left  Result Date: 01/21/2018 CLINICAL DATA:  [address] down stairs EXAM: LEFT SHOULDER - 2+ VIEW  COMPARISON:  None. FINDINGS: There is no evidence of fracture or dislocation. There is no evidence of arthropathy or other focal bone abnormality. Soft tissues are unremarkable. IMPRESSION: Negative. Electronically Signed   By: Franchot Gallo M.D.   On: 01/21/2018 15:00   Dg C-arm 1-60 Min  Result Date: 01/22/2018 CLINICAL DATA:  Cervical spine decompression and fusion. EXAM: CERVICAL SPINE - 2-3 VIEW; DG C-ARM 61-120 MIN COMPARISON:  None. FINDINGS: Two C-arm views of the cervical spine demonstrate pedicle screw and rod an plate and screw fusion at multiple levels. The bone details are not adequately visualized to determine the levels involved. IMPRESSION: Operative changes, as described above. Electronically Signed   By: Claudie Revering M.D.   On: 01/22/2018 22:00   Mr Brain/iac W Wo Contrast  Result Date: 01/13/2018  [facility] [address] [place], [place] [address] [phone] NEUROIMAGING REPORT STUDY DATE: 01/12/2018 PATIENT NAME: AVERIL DIGMAN DOB: 1940/07/17 MRN: [id number] EXAM: MRI Brain with and without contrast ORDERING CLINICIAN: Andrey Spearman MD CLINICAL HISTORY: 77 year old woman with vertigo COMPARISON FILMS: None TECHNIQUE:MRI of the brain with and without contrast was obtained utilizing 5 mm axial slices with T1, T2, T2 flair, SWI and diffusion weighted views.  T1 sagittal and postcontrast views in the axial and coronal plane were obtained.  Additional thin section pre-and post contrasted axial and coronal T1-weighted and axial  CISS images through the internal auditory canals were performed. CONTRAST: 19 ml [employer] IMAGING SITE: [facility], [place]. FINDINGS: On sagittal images, the spinal cord is imaged caudally to C3 and is normal in caliber.   The contents of the posterior fossa are of normal size and position.   The pituitary gland and optic chiasm appear normal.   The third and lateral ventricles are mildly enlarged, in  proportion to the extent of mild generalized cortical atrophy.  There are no abnormal extra-axial collections of fluid.  There are T2/FLAIR hyperintense foci in the pons.  The cerebellum and deep gray matter appears normal.  In the hemispheres, there are some scattered T2/FLAIR hyperintense foci in the subcortical and deep white matter.  None of the foci appears to be acute.   Diffusion weighted images are normal.  Susceptibility weighted images are normal.   The VIIth/VIIIth nerve complexes appear normal before and after contrast.  There have been bilateral lens replacements.  The orbits are otherwise normal.  The mastoid air cells appear normal.  The paranasal sinuses appear normal.  Flow voids are identified within the major intracerebral arteries.  After the infusion of contrast material, a normal enhancement pattern is noted.   This MRI of the brain with and without contrast with added attention to the internal auditory canal shows the following: 1.     T2/FLAIR hypertense foci in the pons and hemispheres consistent with chronic microvascular ischemic change.  None of the foci appears to be acute. 2.     Mild generalized cortical atrophy. 3.     The internal auditory canals appear normal before and after contrast. INTERPRETING PHYSICIAN: Richard A. Felecia Shelling, MD, PhD, FAAN Certified in  Neuroimaging by [employer]    Time Spent in minutes  30   Lala Lund M.D on 01/27/2018 at 9:58 AM  To page go to [email or web address] - password [facility]

## 2018-01-27 NOTE — Progress Notes (Signed)
Physical Therapy Treatment Patient Details Name: [name] MRN: [id number] DOB: 04/10/1940 Today's Date: 01/27/2018    History of Present Illness 77 y.o. female admitted on 01/21/18 after falling, hitting her head.  She was dx with C6/7 spinal cord contusion and compression.  Pt underwent C6/7 decompression and C4-T1 posterior fusion on 01/22/18.  Pt with significant PMH of vertigo, DM2, fibromyalgia, essential HTN, anemia, L TKA, rotator cuff repair, neck surgery (ACDF), back surgery, ankle surgery x 2.    PT Comments    Patient with mild progress with therapy this session. Patient regaining strength in BLE helping with bed mobility and attempting to stand. Focused on bed level therex and sitting balance, still requires mod A at times to provide stability.  Pt remains motivated to regain functional mobility.    Follow Up Recommendations  CIR     Equipment Recommendations  Wheelchair (measurements PT);Wheelchair cushion (measurements PT);Hospital bed    Recommendations for Other Services Rehab consult     Precautions / Restrictions Precautions Precautions: Fall Precaution Comments: 4 extremity weakness Required Braces or Orthoses: Cervical Brace Cervical Brace: Hard collar;At all times Restrictions Weight Bearing Restrictions: No    Mobility  Bed Mobility Overal bed mobility: Needs Assistance Bed Mobility: Sidelying to Sit;Rolling;Sit to Sidelying Rolling: Max assist Sidelying to sit: Max assist     Sit to sidelying: Max assist General bed mobility comments: improving in ability to assist with therapy, now able to bring her legs off bed on by herself, went over log roll again  Transfers Overall transfer level: Needs assistance Equipment used: 2 person hand held assist Transfers: Sit to/from Stand Sit to Stand: Total assist;+2 physical assistance;Max assist         General transfer comment: attempted to stand with pad and two people blocking knees, patient able  to come 6 inches off the bed. friends arrived to visit so did not attempt stedy this session, but may be able to progress to that next PT visit.   Ambulation/Gait             General Gait Details: unable at this time   Stairs             Wheelchair Mobility    Modified Rankin (Stroke Patients Only)       Balance Overall balance assessment: Needs assistance;History of Falls Sitting-balance support: Feet supported;Bilateral upper extremity supported Sitting balance-[name]: Poor   Postural control: Posterior lean Standing balance support: Bilateral upper extremity supported Standing balance-[name]: Zero                              Cognition Arousal/Alertness: Awake/alert Behavior During Therapy: WFL for tasks assessed/performed Overall Cognitive Status: Within Functional Limits for tasks assessed                                 General Comments: Appears WNL, A and O x4      Exercises      General Comments        Pertinent Vitals/Pain Pain Assessment: Faces Faces Pain Scale: Hurts even more Pain Location: neck Pain Descriptors / Indicators: Constant;Discomfort;Operative site guarding;Grimacing;Heaviness Pain Intervention(s): Limited activity within patient's tolerance;Monitored during session    Home Living                      Prior Function  PT Goals (current goals can now be found in the care plan section) Acute Rehab PT Goals Patient Stated Goal: to stand  PT Goal Formulation: With patient Time For Goal Achievement: 02/06/18 Potential to Achieve Goals: Good Progress towards PT goals: Progressing toward goals    Frequency    Min 3X/week      PT Plan Current plan remains appropriate    Co-evaluation              AM-PAC PT "6 Clicks" Mobility   Outcome Measure  Help needed turning from your back to your side while in a flat bed without using bedrails?: A Lot Help needed  moving from lying on your back to sitting on the side of a flat bed without using bedrails?: A Lot Help needed moving to and from a bed to a chair (including a wheelchair)?: Total Help needed standing up from a chair using your arms (e.g., wheelchair or bedside chair)?: Total Help needed to walk in hospital room?: Total Help needed climbing 3-5 steps with a railing? : Total 6 Click Score: 8    End of Session Equipment Utilized During Treatment: Cervical collar Activity Tolerance: Patient limited by pain Patient left: with chair alarm set;in bed Nurse Communication: Mobility status;Need for lift equipment PT Visit Diagnosis: Muscle weakness (generalized) (M62.81);Difficulty in walking, not elsewhere classified (R26.2);Other symptoms and signs involving the nervous system (R29.898);Other (comment)     Time: 2956-2130 PT Time Calculation (min) (ACUTE ONLY): 38 min  Charges:  $Therapeutic Exercise: 8-22 mins $Therapeutic Activity: 8-22 mins                    Reinaldo Berber, PT, DPT Acute Rehabilitation Services Pager: [phone] Office: [phone]     Reinaldo Berber 01/27/2018, 3:40 PM

## 2018-01-28 LAB — GLUCOSE, CAPILLARY
Glucose-Capillary: 133 mg/dL — ABNORMAL HIGH (ref 70–99)
Glucose-Capillary: 158 mg/dL — ABNORMAL HIGH (ref 70–99)
Glucose-Capillary: 114 mg/dL — ABNORMAL HIGH (ref 70–99)
Glucose-Capillary: 134 mg/dL — ABNORMAL HIGH (ref 70–99)
Glucose-Capillary: 176 mg/dL — ABNORMAL HIGH (ref 70–99)
Glucose-Capillary: 169 mg/dL — ABNORMAL HIGH (ref 70–99)

## 2018-01-28 MED ORDER — OXYCODONE-ACETAMINOPHEN 5-325 MG PO TABS
1.0000 | ORAL_TABLET | ORAL | Status: DC | PRN
  Administered 2018-01-28 – 2018-01-29 (×6): 1 via ORAL
  Filled 2018-01-28 (×7): qty 1

## 2018-01-28 NOTE — Progress Notes (Signed)
CSW called and spoke with [facility], they are able to take today. CSW spoke with Dr. Wallace Keller, he is wanting to dc the pt on 01/29/18.   CSW will coordinate with [facility] to have a bed for the patient on 01/29/18.   Domenic Schwab, MSW, [place]

## 2018-01-28 NOTE — Progress Notes (Signed)
Patient ID: [name], female   DOB: [birth year], 77 y.o.   MRN: [id number] I will send her stable Motor function appears to be improving substantially Ready for rehab

## 2018-01-28 NOTE — Social Work (Signed)
CSW started authorization through [employer] for [facility].  Westley Hummer, MSW, [place] Work [phone]

## 2018-01-28 NOTE — Progress Notes (Signed)
CSW met with patient and patient daughter at bedside. Patient's daughter is a Marine scientist for [employer]. She had questions about her mother's skilled nursing facility placement. CSW explained the most insurance companies will allow the patient to stay at a SNF for 20 days before a co-pay is charged.   Patient's daughter asked how much the co-pay would be, CSW advised that she should follow up with her mothers insurance company to see what that number would be. CSW verified that [facility] does have a bed available and that the doctor would be discharging the patient on 01/29/18.   CSW verified that the patient would like to be transported by [facility].   Domenic Schwab, MSW, [place]

## 2018-01-28 NOTE — Progress Notes (Signed)
PROGRESS NOTE                                                                                                                                                                                                             Patient Demographics:    [name], is a 77 y.o. female, DOB - [birth year], VEH:[id number]  Admit date - 01/21/2018   Admitting Physician [name], MD  Outpatient Primary MD for the patient is [name], [name], MD  LOS - 6  Chief Complaint  Patient presents with  . Fall       Brief Narrative  [name] is a 77 y.o. female with history of SVT, hypertension, fibromyalgia, diabetes mellitus had a fall at home while she was trying to open her door.  She struck her head when she fell from a porch.  Denied losing consciousness, nochest pain or palpitations.  He came to the ER where a C-spine CT scan showed an acute C7 fracture, Dr. [name] neurosurgeon on-call was consulted by the ER physician who advised a c-collar which was placed, she was subsequently admitted to the hospital.  Early this morning patient requested that she be placed in the chair as she was not comfortable in the bed, she was placed in the chair with assistance, she did fine however after about 45 minutes of sitting up in the chair she reported that she is unable to move her legs further work-up showed that she had bled into her spinal cord space and she was urgently taken to the OR by neurosurgeon Dr. [name].    Subjective:   Patient in bed, appears comfortable, denies any headache, no fever, no chest pain or pressure, no shortness of breath , no abdominal pain. Improving focal weakness.   Assessment  & Plan :     1.  Mechanical fall, acute C7 fracture.  Complicated by spinal cord hematoma and edema causing cord compression.  Patient had chronic upper extremity weakness right greater than left and has now developed lateral lower extremity weakness with some continued  improvement since her decompression surgery right leg now 4/5 left leg 3/5.  She is in c-collar, she was emergently taken to the OR by Dr. [name] on 01/22/2018 and underwent Posterior decompression with laminectomy C6-C7, posterior fixation C4-T1 with T1 pedicle screws and C4-C7 facet screws bilaterally posterior lateral arthrodesis with local autograft C6 and C7.  She required 2 units of packed RBC during the surgery in the OR thereafter  H&H is stable, has tolerated the procedure well, continue PT OT, extremity strength has significantly improved still qualifies for SNF will likely discharge tomorrow updated daughter bedside.  2.  Multiple C-spine surgeries last CT 7 to T1 fusion in September of this year, multiple falls at home.  Had detailed discussion with patient's daughter bedside, patient has had several falls over the last several months and she had been recommended going to rehab multiple times but patient had been refusing it and been noncompliant with recommendations.  Will require SNF at discharge once bed is available.   3.  Chronic bilateral upper extremity right more than left weakness.  Continue PT/OT, will require placement.  4.  History of SVT.  On metoprolol, verapamil and digoxin combination which will be continued.  5.  HX of restless leg syndrome along with peripheral neuropathy.  On Requip continue.  6.  Essential hypertension.  On combination of metoprolol and ACE inhibitor along with verapamil.  Continue and monitor.  7.  Chronic diastolic CHF EF 71% on echocardiogram done in November 2019.  Mildly elevated troponin and nonspecific non-ACS pattern.  No chest pain, no shortness of breath, EKG nonacute.  Continue combination of aspirin and beta-blocker for secondary prevention and monitor.  No acute issues.   8.  GERD.  On PPI.   9.  UTI.  Pleated her Keflex course.  10.  Hyponatremia.  This was SIADH improved with fluid restriction.   11. Ur.Retention and Constipation -  having bowel movements after placing on aggressive bowel regimen, received Foley and Flomax on 01/25/2018, will give a trial of removal on 01/28/2018 as her weakness is now improved, monitor bladder scans..    12.  Hypomagnesemia.  Replaced and stable.    13. DM type II.  On Lantus twice daily along with sliding scale will monitor.  CBGs stable for now.  CBG (last 3)  Recent Labs    01/27/18 1621 01/27/18 2134 01/28/18 0802  GLUCAP 133* 158* 114*      Family Communication  :  Daughter bedside  Code Status :  Full  Disposition Plan  : SNF once bed is available  Consults  :  N.Surg  Procedures  :    01/22/18 by Dr [name] - Posterior decompression with laminectomy C6-C7, posterior fixation C4-T1 with T1 pedicle screws and C4-C7 facet screws bilaterally posterior lateral arthrodesis with local autograft C6 and C7.  CT C spine - Acute fracture of the left side of the C7 vertebral body extends into the left facet and base of the left transverse process. No other acute abnormality is identified. 0.4 cm anterolisthesis C7 on T1 is unchanged. Status post C4-6 and C7-T1 fusion.  MRI C Spine - 1. C6-7 spinal cord edema with focal dorsal intramedullary versus extramedullary/epidural hemorrhage posteriorly, favor the latter. 2. Extensive edema and hemorrhage throughout the posterior neck soft tissues. Possible posterior ligamentous complex injury at C6-7. 3. C7 vertebral and posterior element fracture better demonstrated on CT. 4. Noncompressive dorsal epidural hematoma throughout the cervical and visualized upper thoracic spine. These results were called by telephone at the time of interpretation on 01/22/2018 at 3:13 pm to Dr. [name], who verbally acknowledged these results.  TTE Nov 2019 -   Left ventricle: The cavity size was normal. Systolic function was  normal. The estimated ejection fraction was in the range of 60% to 65%. Wall motion was normal; there were no regional wall motion  abnormalities. Left ventricular diastolic function parameters were normal for  the patient&'s age. - Aortic valve: Trileaflet; mildly thickened, mildly calcified leaflets. Left coronary cusp predominanly affected by calcification. Sclerosis without stenosis. There was no   regurgitation. - Mitral valve: There was no significant regurgitation. - Right ventricle: Systolic function was normal. - Atrial septum: No defect or patent foramen ovale was identified. - Tricuspid valve: There was no significant regurgitation. - Pulmonic valve: There was no significant regurgitation.   DVT Prophylaxis  : SCDs   Lab Results  Component Value Date   PLT 167 01/25/2018    Diet :  Diet Order            DIET SOFT Room service appropriate? Yes; Fluid consistency: Thin  Diet effective now               Inpatient Medications Scheduled Meds: . digoxin  0.125 mg Oral Daily  . docusate sodium  100 mg Oral BID  . insulin aspart  0-9 Units Subcutaneous TID WC  . insulin glargine  25 Units Subcutaneous BID  . metoprolol succinate  25 mg Oral Daily  . pantoprazole  40 mg Oral Daily  . polyethylene glycol  17 g Oral BID  . rOPINIRole  1 mg Oral QHS  . senna  1 tablet Oral BID  . tamsulosin  0.4 mg Oral Daily  . verapamil  240 mg Oral Daily   Continuous Infusions:  PRN Meds:.acetaminophen **OR** acetaminophen, alum & mag hydroxide-simeth, bisacodyl, diltiazem, hydrALAZINE, LORazepam, methocarbamol **OR** [DISCONTINUED] methocarbamol (ROBAXIN) IV, metoprolol tartrate, naphazoline-glycerin, nitroGLYCERIN, [DISCONTINUED] ondansetron **OR** ondansetron (ZOFRAN) IV, oxyCODONE-acetaminophen, sodium chloride, sodium phosphate  Antibiotics  :   Anti-infectives (From admission, onward)   Start     Dose/Rate Route Frequency Ordered Stop   01/24/18 0600  vancomycin (VANCOCIN) 1,250 mg in sodium chloride 0.9 % 250 mL IVPB  Status:  Discontinued     1,250 mg 166.7 mL/hr over 90 Minutes Intravenous Every 24  hours 01/23/18 1056 01/26/18 1158   01/23/18 0600  vancomycin (VANCOCIN) 1,750 mg in sodium chloride 0.9 % 500 mL IVPB  Status:  Discontinued     1,750 mg 250 mL/hr over 120 Minutes Intravenous Every 24 hours 01/23/18 0012 01/23/18 1056   01/22/18 2020  bacitracin 50,000 Units in sodium chloride 0.9 % 500 mL irrigation  Status:  Discontinued       As needed 01/22/18 2021 01/22/18 2216   01/22/18 1800  cephALEXin (KEFLEX) capsule 500 mg     500 mg Oral Every 12 hours 01/22/18 0945 01/25/18 0939   01/22/18 0600  ciprofloxacin (CIPRO) IVPB 400 mg  Status:  Discontinued     400 mg 200 mL/hr over 60 Minutes Intravenous Every 12 hours 01/21/18 2237 01/22/18 0906   01/21/18 1845  ciprofloxacin (CIPRO) IVPB 400 mg     400 mg 200 mL/hr over 60 Minutes Intravenous  Once 01/21/18 1832 01/21/18 2033          Objective:   Vitals:   01/27/18 0834 01/27/18 1622 01/27/18 2132 01/28/18 0540  BP: (!) 177/78 140/72 (!) 142/59 (!) 165/74  Pulse: 72 63 65 69  Resp: 18 (!) 2 17 18   Temp: 98.9 F (37.2 C) 99.7 F (37.6 C) 99.2 F (37.3 C) 99.1 F (37.3 C)  TempSrc: Oral Oral Oral Oral  SpO2: 96% 96% 94% 98%  Weight:      Height:        Wt Readings from Last 3 Encounters:  01/22/18 94.3 kg  12/28/17 94.3 kg  12/21/17 94.3 kg  Intake/Output Summary (Last 24 hours) at 01/28/2018 0929 Last data filed at 01/28/2018 2297 Gross per 24 hour  Intake 539.37 ml  Output 2125 ml  Net -1585.63 ml     Physical Exam  Awake Alert, Oriented X 3,  wearing c-collar, chronic upper extremity weakness with right more than left, lower extremity right leg 4/5 left leg 3/5 some improvement, foley in place [place].AT,PERRAL Supple Neck,No JVD, No cervical lymphadenopathy appriciated.  Symmetrical Chest wall movement, Good air movement bilaterally, CTAB RRR,No Gallops, Rubs or new Murmurs, No Parasternal Heave +ve B.Sounds, Abd Soft, No tenderness, No organomegaly appriciated, No rebound - guarding or  rigidity. No Cyanosis, Clubbing or edema, No new Rash or bruise    Data Review:    CBC Recent Labs  Lab 01/21/18 1612 01/22/18 0042 01/23/18 0327 01/24/18 0335 01/25/18 0401  WBC 19.0* 10.2 11.7* 8.8 9.1  HGB 13.8 12.8 10.4* 9.9* 10.4*  HCT 42.6 38.8 31.5* 31.5* 32.0*  PLT 175 181 164 159 167  MCV 87.3 85.7 86.3 89.2 87.4  MCH 28.3 28.3 28.5 28.0 28.4  MCHC 32.4 33.0 33.0 31.4 32.5  RDW 13.1 13.2 13.2 13.6 13.4  LYMPHSABS 1.1  --   --   --   --   MONOABS 0.9  --   --   --   --   EOSABS 0.0  --   --   --   --   BASOSABS 0.1  --   --   --   --     Chemistries  Recent Labs  Lab 01/21/18 1612 01/22/18 0042 01/23/18 0327 01/24/18 0335 01/25/18 0401  NA 136 133* 130* 134* 134*  K 4.1 4.4 4.4 4.2 4.2  CL 102 98 98 102 100  CO2 21* 22 24 24 24   GLUCOSE 204* 189* 189* 112* 123*  BUN 22 19 20 19 16   CREATININE 0.84 0.84 0.92 0.81 0.86  CALCIUM 9.6 9.1 8.4* 8.3* 8.5*  MG  --  1.6*  --   --  2.1   ------------------------------------------------------------------------------------------------------------------ No results for input(s): CHOL, HDL, LDLCALC, TRIG, CHOLHDL, LDLDIRECT in the last 72 hours.  Lab Results  Component Value Date   HGBA1C 7.6 (H) 11/12/2017   ------------------------------------------------------------------------------------------------------------------ No results for input(s): TSH, T4TOTAL, T3FREE, THYROIDAB in the last 72 hours.  Invalid input(s): FREET3 ------------------------------------------------------------------------------------------------------------------ No results for input(s): VITAMINB12, FOLATE, FERRITIN, TIBC, IRON, RETICCTPCT in the last 72 hours.  Coagulation profile Recent Labs  Lab 01/21/18 1612  INR 0.94    No results for input(s): DDIMER in the last 72 hours.  Cardiac Enzymes Recent Labs  Lab 01/22/18 0042 01/22/18 0645 01/22/18 1114  TROPONINI 0.59* 0.54* 0.36*    ------------------------------------------------------------------------------------------------------------------    Component Value Date/Time   BNP 10.6 02/14/2015 0341    Micro Results Recent Results (from the past 240 hour(s))  Surgical pcr screen     Status: None   Collection Time: 01/22/18  5:48 PM  Result Value Ref Range Status   MRSA, PCR NEGATIVE NEGATIVE Final   Staphylococcus aureus NEGATIVE NEGATIVE Final    Comment: (NOTE) The Xpert SA Assay (FDA approved for NASAL specimens in patients 69 years of age and older), is one component of a comprehensive surveillance program. It is not intended to diagnose infection nor to guide or monitor treatment. Performed at [facility], [place] [address] [place], [place] [address]     Radiology Reports Dg Cervical Spine 2-3 Views  Result Date: 01/22/2018 CLINICAL DATA:  Cervical spine decompression  and fusion. EXAM: CERVICAL SPINE - 2-3 VIEW; DG C-ARM 61-120 MIN COMPARISON:  None. FINDINGS: Two C-arm views of the cervical spine demonstrate pedicle screw and rod an plate and screw fusion at multiple levels. The bone details are not adequately visualized to determine the levels involved. IMPRESSION: Operative changes, as described above. Electronically Signed   By: Claudie Revering M.D.   On: 01/22/2018 22:00   Dg Shoulder Right  Result Date: 01/21/2018 CLINICAL DATA:  Fall EXAM: RIGHT SHOULDER - 2+ VIEW COMPARISON:  07/19/2011 FINDINGS: Negative for fracture or dislocation. Mild degenerative change in the [facility] joint and shoulder joint. Mild associated spurring. IMPRESSION: Negative for fracture or dislocation. Electronically Signed   By: Franchot Gallo M.D.   On: 01/21/2018 14:59   Ct Head Wo Contrast  Result Date: 01/21/2018 CLINICAL DATA:  Patient status post C7-T1 fusion 10/24/2017. History of prior C4-6 fusion. Status post fall off a porch today. Limited range of motion of the head and new tingling in the left fingers since the  fall. Initial encounter. EXAM: CT HEAD WITHOUT CONTRAST CT CERVICAL SPINE WITHOUT CONTRAST TECHNIQUE: Multidetector CT imaging of the head and cervical spine was performed following the standard protocol without intravenous contrast. Multiplanar CT image reconstructions of the cervical spine were also generated. COMPARISON:  Brain MRI 01/12/2018. Cervical spine CT scan 12/03/2017. Head CT 09/21/2017. FINDINGS: CT HEAD FINDINGS Brain: No evidence of acute infarction, hemorrhage, hydrocephalus, extra-axial collection or mass lesion/mass effect. Vascular: No hyperdense vessel or unexpected calcification. Skull: Intact.  No focal lesion. Sinuses/Orbits: Small mucous retention cyst or polyp right maxillary sinus is unchanged. The patient is status post cataract surgery bilaterally. Other: None. CT CERVICAL SPINE FINDINGS Alignment: Approximately 0.4 cm anterolisthesis C7 on T1 is unchanged since the prior CT. Alignment is otherwise maintained. Skull base and vertebrae: The patient has an acute fracture of C7. The fracture extends from the left side of the vertebral body into the left C7 facet and base of the left transverse process. The fracture is minimally displaced at 1-2 mm. No other fracture is identified. The interbody spacer and C7 appears to be incorporating into the endplates. Soft tissues and spinal canal: No epidural hematoma is identified. Disc levels: Loss of disc space height and endplate spurring at M0-1 appear unchanged. Upper chest: Lung apices are clear. Other: None. IMPRESSION: Acute fracture of the left side of the C7 vertebral body extends into the left facet and base of the left transverse process. No other acute abnormality is identified. 0.4 cm anterolisthesis C7 on T1 is unchanged. Status post C4-6 and C7-T1 fusion. No acute intracranial normality. Electronically Signed   By: Inge Rise M.D.   On: 01/21/2018 15:32   Ct Cervical Spine Wo Contrast  Result Date: 01/21/2018 CLINICAL DATA:   Patient status post C7-T1 fusion 10/24/2017. History of prior C4-6 fusion. Status post fall off a porch today. Limited range of motion of the head and new tingling in the left fingers since the fall. Initial encounter. EXAM: CT HEAD WITHOUT CONTRAST CT CERVICAL SPINE WITHOUT CONTRAST TECHNIQUE: Multidetector CT imaging of the head and cervical spine was performed following the standard protocol without intravenous contrast. Multiplanar CT image reconstructions of the cervical spine were also generated. COMPARISON:  Brain MRI 01/12/2018. Cervical spine CT scan 12/03/2017. Head CT 09/21/2017. FINDINGS: CT HEAD FINDINGS Brain: No evidence of acute infarction, hemorrhage, hydrocephalus, extra-axial collection or mass lesion/mass effect. Vascular: No hyperdense vessel or unexpected calcification. Skull: Intact.  No focal lesion. Sinuses/Orbits: Small  mucous retention cyst or polyp right maxillary sinus is unchanged. The patient is status post cataract surgery bilaterally. Other: None. CT CERVICAL SPINE FINDINGS Alignment: Approximately 0.4 cm anterolisthesis C7 on T1 is unchanged since the prior CT. Alignment is otherwise maintained. Skull base and vertebrae: The patient has an acute fracture of C7. The fracture extends from the left side of the vertebral body into the left C7 facet and base of the left transverse process. The fracture is minimally displaced at 1-2 mm. No other fracture is identified. The interbody spacer and C7 appears to be incorporating into the endplates. Soft tissues and spinal canal: No epidural hematoma is identified. Disc levels: Loss of disc space height and endplate spurring at C5-8 appear unchanged. Upper chest: Lung apices are clear. Other: None. IMPRESSION: Acute fracture of the left side of the C7 vertebral body extends into the left facet and base of the left transverse process. No other acute abnormality is identified. 0.4 cm anterolisthesis C7 on T1 is unchanged. Status post C4-6 and  C7-T1 fusion. No acute intracranial normality. Electronically Signed   By: Inge Rise M.D.   On: 01/21/2018 15:32   Mr Jodene Nam Head Wo Contrast  Result Date: 01/13/2018  [facility] [address] [place], [place] [address] [phone] NEUROIMAGING REPORT STUDY DATE: 01/12/2018 PATIENT NAME: LAIKLYN PILKENTON DOB: Oct 12, 1940 MRN: [id number] EXAM: MR angiogram of the intracranial arteries ORDERING CLINICIAN: Andrey Spearman MD CLINICAL HISTORY: 77 year old woman with vertigo and vertebrobasilar TIAs COMPARISON FILMS: None TECHNIQUE: MR angiogram of the head was obtained utilizing 3D time of flight sequences from below the vertebrobasilar junction up to the intracranial vasculature without contrast.  Computerized reconstructions were obtained. CONTRAST: None IMAGING SITE: [facility], [place], [place], [place] FINDINGS: The imaged extracranial and intracranial portions of the internal carotid arteries appear normal. The middle cerebral appear normal.  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   In the posterior circulation, the left vertebral artery is mildly dominant.  No stenosis is noted within the vertebral arteries and the basilar arteries.  Posterior communicating arteries are noted bilaterally, right greater than left.  The right posterior artery derives most of its flow from the anterior circulation through the right posterior communicating artery.  The posterior cerebral arteries do not appear to have stenosis.. No aneurysms were identified.   This MR angiogram of the intracranial arteries shows the following: 1.    Normal variant anatomy with an absent A1 segment of the right anterior cerebral artery.  There is a fetal origin of the right posterior cerebral artery. 2.    No significant arterial stenosis.  No aneurysms.  INTERPRETING  PHYSICIAN: Richard A. Felecia Shelling, MD, PhD, FAAN Certified in  Neuroimaging by [employer]   Mr Jodene Nam Neck W Wo Contrast  Result Date: 01/13/2018  [facility] [address] [place], [place] [address] [phone] NEUROIMAGING REPORT STUDY DATE: 01/12/2018 PATIENT NAME: DEDRA MATSUO DOB: [birth year] MRN: [id number] EXAM: MR Angiogram of the Neck Arteries with and without contrast ORDERING CLINICIAN: Andrey Spearman MD CLINICAL HISTORY: 77 year old woman with vertebrobasilar TIAs COMPARISON FILMS: None TECHNIQUE: MR angiogram of the neck was obtained utilizing 3D time-of-flight sequences from the aortic arch up to the intracranial vasculature postbolus contrast infusion.  2D time-of-flight noncontrast views and computerized reconstructions were obtained. CONTRAST: 19 mL [facility] IMAGING SITE: [facility],  [address] [place], [place] FINDINGS: This study is of adequate technical quality.  There is anterograde flow in the bilateral vertebral and carotid arteries on 2D-TOF views.  The flow signal of the left subclavian artery has no stenosis.  The left common and external carotid arteries have no stenosis.  There is minimal (10 to 15%) stenosis at the origin of the left internal carotid artery.  This is hemodynamically significant.  The left vertebral artery is mildly smaller than the right.  The origin appears normal.  There is reduced flow signal in the proximal cervical segment that is most likely artifactual.  The more distal left vertebral artery has no stenosis up to the vertebrobasilar junction.  On the right, the brachiocephalic trunk and subclavian arteries have no stenosis. The right common, internal and external carotid arteries have no stenosis. The right vertebral artery has no stenosis from its origin to the vertebrobasilar junction. Limited views of the intracranial vasculature are unremarkable.   This MR angiogram of the neck arteries shows the  following: 1.    Although there is reduced flow signal in the proximal cervical segment of the left vertebral artery on the source and reconstructed images, this is most likely artifactual as there is completely normal flow more distally. 2.    Minimal (less than 15%) stenosis at the origin of the left internal carotid artery.  This is not hemodynamically significant.  3.    Other arteries appear normal. INTERPRETING PHYSICIAN: Richard A. Felecia Shelling, MD, PhD, FAAN Certified in  Neuroimaging by  [employer]   Mr Cervical Spine Wo Contrast  Result Date: 01/22/2018 CLINICAL DATA:  Fall with left-sided vertebral body and posterior element fractures at C7. Prior cervical fusion. Sudden lower extremity weakness. EXAM: MRI CERVICAL SPINE WITHOUT CONTRAST TECHNIQUE: Multiplanar, multisequence MR imaging of the cervical spine was performed. No intravenous contrast was administered. COMPARISON:  Cervical spine CT 01/21/2018 and MRI 08/07/2017 FINDINGS: Alignment: Grade 1 anterolisthesis of C6 on C7 and C7 on T1, unchanged. Vertebrae: Bilateral C7 facet edema with a left-sided fracture shown on yesterday's CT. Fracture component involving the C7 vertebral body not well demonstrated by MRI with evaluation limited by artifact from C4-T1 anterior fusion plates and screws. Cord: T2 hyperintensity in the cord at C6-7 with cord expansion consistent with edema. Suspected focal hemorrhage along the posterior margin of the cord at C7 (series 8, image 26), uncertain if this is intramedullary or extramedullary in location. Noncompressive dorsal epidural hematoma throughout the cervical and included upper thoracic spine. Posterior Fossa, vertebral arteries, paraspinal tissues: Extensive edema and hematoma throughout the musculature and other soft tissues of the posterior neck bilaterally. STIR hyperintensity in the interspinous soft tissues at C6-7 with possible disruption of the ligamentum flavum. Disc levels: C2-3:  Mild facet arthrosis and tiny central disc protrusion without stenosis, unchanged from the prior MRI. C3-4: Broad-based posterior disc osteophyte complex and minimal facet arthrosis result in mild spinal stenosis and moderate to severe right and moderate left neural foraminal stenosis, unchanged from the prior MRI. C4-5: Prior ACDF.  No significant stenosis. C5-6: Prior ACDF.  No significant stenosis. C6-7: Prior ACDF. Slight anterolisthesis with epidural hematoma. Mild spinal stenosis. C7-T1: ACDF. Anterolisthesis and facet arthrosis result in moderate bilateral neural foraminal stenosis. Epidural hematoma with mild spinal stenosis. T1-2: Small right central disc protrusion, unchanged from the prior MRI and slightly deforming the ventral spinal cord. Unchanged mild left neural foraminal stenosis. T2-3: Mild disc bulging with borderline bilateral neural foraminal stenosis and  no spinal stenosis. T3-4: Small central disc extrusion mildly deforming the ventral spinal cord, similar to the prior MRI. T5-6 and T6-7: Only imaged sagittally. Small central disc herniations at both levels contacting and at most slightly deforming the spinal cord. IMPRESSION: 1. C6-7 spinal cord edema with focal dorsal intramedullary versus extramedullary/epidural hemorrhage posteriorly, favor the latter. 2. Extensive edema and hemorrhage throughout the posterior neck soft tissues. Possible posterior ligamentous complex injury at C6-7. 3. C7 vertebral and posterior element fracture better demonstrated on CT. 4. Noncompressive dorsal epidural hematoma throughout the cervical and visualized upper thoracic spine. These results were called by telephone at the time of interpretation on 01/22/2018 at 3:13 pm to Dr. [name], who verbally acknowledged these results. Electronically Signed   By: Logan Bores M.D.   On: 01/22/2018 15:49   Dg Shoulder Left  Result Date: 01/21/2018 CLINICAL DATA:  [address] down stairs EXAM: LEFT SHOULDER - 2+ VIEW  COMPARISON:  None. FINDINGS: There is no evidence of fracture or dislocation. There is no evidence of arthropathy or other focal bone abnormality. Soft tissues are unremarkable. IMPRESSION: Negative. Electronically Signed   By: Franchot Gallo M.D.   On: 01/21/2018 15:00   Dg C-arm 1-60 Min  Result Date: 01/22/2018 CLINICAL DATA:  Cervical spine decompression and fusion. EXAM: CERVICAL SPINE - 2-3 VIEW; DG C-ARM 61-120 MIN COMPARISON:  None. FINDINGS: Two C-arm views of the cervical spine demonstrate pedicle screw and rod an plate and screw fusion at multiple levels. The bone details are not adequately visualized to determine the levels involved. IMPRESSION: Operative changes, as described above. Electronically Signed   By: Claudie Revering M.D.   On: 01/22/2018 22:00   Mr Brain/iac W Wo Contrast  Result Date: 01/13/2018  [facility] [address] [place], [place] [address] [phone] NEUROIMAGING REPORT STUDY DATE: 01/12/2018 PATIENT NAME: HARBOR PASTER DOB: Apr 11, 1940 MRN: [id number] EXAM: MRI Brain with and without contrast ORDERING CLINICIAN: Andrey Spearman MD CLINICAL HISTORY: 77 year old woman with vertigo COMPARISON FILMS: None TECHNIQUE:MRI of the brain with and without contrast was obtained utilizing 5 mm axial slices with T1, T2, T2 flair, SWI and diffusion weighted views.  T1 sagittal and postcontrast views in the axial and coronal plane were obtained.  Additional thin section pre-and post contrasted axial and coronal T1-weighted and axial  CISS images through the internal auditory canals were performed. CONTRAST: 19 ml [employer] IMAGING SITE: [facility], [place]. FINDINGS: On sagittal images, the spinal cord is imaged caudally to C3 and is normal in caliber.   The contents of the posterior fossa are of normal size and position.   The pituitary gland and optic chiasm appear normal.   The third and lateral ventricles are mildly enlarged, in  proportion to the extent of mild generalized cortical atrophy.  There are no abnormal extra-axial collections of fluid.  There are T2/FLAIR hyperintense foci in the pons.  The cerebellum and deep gray matter appears normal.  In the hemispheres, there are some scattered T2/FLAIR hyperintense foci in the subcortical and deep white matter.  None of the foci appears to be acute.   Diffusion weighted images are normal.  Susceptibility weighted images are normal.   The VIIth/VIIIth nerve complexes appear normal before and after contrast.  There have been bilateral lens replacements.  The orbits are otherwise normal.  The mastoid air cells appear normal.  The paranasal sinuses appear normal.  Flow voids are identified within the major intracerebral arteries.  After the infusion of contrast material, a normal enhancement pattern is noted.   This MRI of the brain with and without contrast with added attention to the internal auditory canal shows the following: 1.     T2/FLAIR hypertense foci in the pons and hemispheres consistent with chronic microvascular ischemic change.  None of the foci appears to be acute. 2.     Mild generalized cortical atrophy. 3.     The internal auditory canals appear normal before and after contrast. INTERPRETING PHYSICIAN: Richard A. Felecia Shelling, MD, PhD, FAAN Certified in  Neuroimaging by  [employer]    Time Spent in minutes  30   Lala Lund M.D on 01/28/2018 at 9:29 AM  To page go to [email or web address] - password [facility]

## 2018-01-28 NOTE — Plan of Care (Signed)

## 2018-01-28 NOTE — Progress Notes (Signed)
Occupational Therapy Treatment Patient Details Name: [name] MRN: [id number] DOB: 12-04-1940 Today's Date: 01/28/2018    History of present illness 77 y.o. female admitted on 01/21/18 after falling, hitting her head.  She was dx with C6/7 spinal cord contusion and compression.  Pt underwent C6/7 decompression and C4-T1 posterior fusion on 01/22/18.  Pt with significant PMH of vertigo, DM2, fibromyalgia, essential HTN, anemia, L TKA, rotator cuff repair, neck surgery (ACDF), back surgery, ankle surgery x 2.   OT comments  Pt progressing towards established OT goals. Providing pt with AE for self feeding and grooming including built up handle, U-cuff, and cup with lid and handle. Pt performing self feeding tasks at bed level with Mod A and optimized positioning (bed in chair position). Pt using u-cuff to self feed with left hand. Pt performing oral care with U-cuff and Max A for bilateral coordination. Pt highly motivated to participate and was very thankful for AE. Continue to recommend dc to CIR and will continue to follow acutely as admitted.      Follow Up Recommendations  CIR    Equipment Recommendations  Other (comment)(TBD at next venue of care)    Recommendations for Other Services Rehab consult    Precautions / Restrictions Precautions Precautions: Fall Precaution Comments: 4 extremity weakness Required Braces or Orthoses: Cervical Brace Cervical Brace: Hard collar;At all times Restrictions Weight Bearing Restrictions: No       Mobility Bed Mobility Overal bed mobility: Needs Assistance             General bed mobility comments: Max A +2 for positioning in bed and placing pt in chair position.   Transfers                      Balance                                           ADL either performed or assessed with clinical judgement   ADL Overall ADL's : Needs assistance/impaired Eating/Feeding: Moderate assistance;Bed  level Eating/Feeding Details (indicate cue type and reason): Optimizing sitting position with bed set to chair position. Pt able to use left hand to pick up finger food such as bacon or english muffin. Requiring Max A for donning U-cuff, and then able to use fork to eat eggs. Pt also able to pull fork out of U-cuff when finished. Providing pt with cup with handle and she was able to bring cup to her mouth and drink with straw.  Grooming: Maximal assistance;Bed level Grooming Details (indicate cue type and reason): Max A for bilateral tasks. Pt requesting to perform oral care. Placing tooth brush into U-cuff and pt was able to bush her teeth with LUE. Pt requiring assistance for dipping tooth brush into mouth wash.                                General ADL Comments: Focused session on AE for eating and grooming. Pt performing self feeding and oral care with optimized positioning in bed. Reporting signfiicant pressure at neck with upright posture; lowering HOB for comfort     Vision   Vision Assessment?: No apparent visual deficits   Perception     Praxis      Cognition Arousal/Alertness: Awake/alert Behavior During Therapy: WFL for tasks assessed/performed  Overall Cognitive Status: No family/caregiver present to determine baseline cognitive functioning                                 General Comments: Slight tangential in conversation and would switch quickly from different topics. Also, repeating story about when her husband had a heart attack (in 1999?).         Exercises Exercises: General Upper Extremity General Exercises - Upper Extremity Shoulder Flexion: AAROM;Right;10 reps;Supine Shoulder Extension: AAROM;Right;10 reps;Supine Elbow Flexion: AAROM;Right;10 reps;Supine Elbow Extension: AAROM;Right;10 reps;Supine Wrist Flexion: AAROM;Right;10 reps;Supine Wrist Extension: AAROM;Right;10 reps;Supine Digit Composite Flexion: AAROM;Right;10  reps;Supine Composite Extension: AAROM;Right;10 reps;Supine   Shoulder Instructions       General Comments      Pertinent Vitals/ Pain       Pain Assessment: Faces Faces Pain Scale: Hurts even more Pain Location: neck Pain Descriptors / Indicators: Constant;Discomfort;Operative site guarding;Grimacing;Heaviness Pain Intervention(s): Monitored during session;Limited activity within patient's tolerance;Repositioned  Home Living                                          Prior Functioning/Environment              Frequency  Min 3X/week        Progress Toward Goals  OT Goals(current goals can now be found in the care plan section)  Progress towards OT goals: Progressing toward goals  Acute Rehab OT Goals Patient Stated Goal: to stand  OT Goal Formulation: With patient Time For Goal Achievement: 02/06/18 Potential to Achieve Goals: Good ADL Goals Pt Will Perform Eating: with min assist;sitting;with adaptive utensils Pt Will Perform Grooming: with min assist;sitting;with adaptive equipment Pt Will Perform Upper Body Bathing: with min assist;sitting;with adaptive equipment Additional ADL Goal #1: Pt will tolerate sitting EOB for 10 minutes given moderate assist to maintain balance to increase engagement with ADL participation.  Plan Discharge plan remains appropriate    Co-evaluation                 AM-PAC OT "6 Clicks" Daily Activity     Outcome Measure   Help from another person eating meals?: A Lot Help from another person taking care of personal grooming?: A Lot Help from another person toileting, which includes using toliet, bedpan, or urinal?: Total Help from another person bathing (including washing, rinsing, drying)?: A Lot Help from another person to put on and taking off regular upper body clothing?: A Lot Help from another person to put on and taking off regular lower body clothing?: Total 6 Click Score: 10    End of Session  Equipment Utilized During Treatment: Gait belt  OT Visit Diagnosis: Unsteadiness on feet (R26.81);Other abnormalities of gait and mobility (R26.89);Muscle weakness (generalized) (M62.81);Other symptoms and signs involving the nervous system (R29.898);Pain Pain - part of body: (neck)   Activity Tolerance Patient limited by pain;Patient tolerated treatment well   Patient Left Other (comment)(with PT )   Nurse Communication Mobility status        Time: [phone] OT Time Calculation (min): 49 min  Charges: OT General Charges $OT Visit: 1 Visit OT Treatments $Self Care/Home Management : 23-37 mins $Therapeutic Activity: 8-22 mins  [place], OTR/L Acute Rehab Pager: [phone] Office: [place] 01/28/2018, 4:03 PM

## 2018-01-29 ENCOUNTER — Other Ambulatory Visit: Payer: Self-pay

## 2018-01-29 ENCOUNTER — Ambulatory Visit: Payer: PPO | Admitting: Internal Medicine

## 2018-01-29 DIAGNOSIS — M2560 Stiffness of unspecified joint, not elsewhere classified: Secondary | ICD-10-CM | POA: Diagnosis not present

## 2018-01-29 DIAGNOSIS — Z9181 History of falling: Secondary | ICD-10-CM | POA: Diagnosis not present

## 2018-01-29 DIAGNOSIS — I5032 Chronic diastolic (congestive) heart failure: Secondary | ICD-10-CM | POA: Diagnosis not present

## 2018-01-29 DIAGNOSIS — M6281 Muscle weakness (generalized): Secondary | ICD-10-CM | POA: Diagnosis not present

## 2018-01-29 DIAGNOSIS — W19XXXA Unspecified fall, initial encounter: Secondary | ICD-10-CM | POA: Diagnosis not present

## 2018-01-29 DIAGNOSIS — R102 Pelvic and perineal pain: Secondary | ICD-10-CM | POA: Diagnosis not present

## 2018-01-29 DIAGNOSIS — M25551 Pain in right hip: Secondary | ICD-10-CM | POA: Diagnosis not present

## 2018-01-29 DIAGNOSIS — R29898 Other symptoms and signs involving the musculoskeletal system: Secondary | ICD-10-CM | POA: Diagnosis not present

## 2018-01-29 DIAGNOSIS — R41841 Cognitive communication deficit: Secondary | ICD-10-CM | POA: Diagnosis not present

## 2018-01-29 DIAGNOSIS — J189 Pneumonia, unspecified organism: Secondary | ICD-10-CM | POA: Diagnosis not present

## 2018-01-29 DIAGNOSIS — Z7401 Bed confinement status: Secondary | ICD-10-CM | POA: Diagnosis not present

## 2018-01-29 DIAGNOSIS — M797 Fibromyalgia: Secondary | ICD-10-CM | POA: Diagnosis not present

## 2018-01-29 DIAGNOSIS — I10 Essential (primary) hypertension: Secondary | ICD-10-CM | POA: Diagnosis not present

## 2018-01-29 DIAGNOSIS — I471 Supraventricular tachycardia: Secondary | ICD-10-CM | POA: Diagnosis not present

## 2018-01-29 DIAGNOSIS — M628 Other specified disorders of muscle: Secondary | ICD-10-CM | POA: Diagnosis not present

## 2018-01-29 DIAGNOSIS — Z4789 Encounter for other orthopedic aftercare: Secondary | ICD-10-CM | POA: Diagnosis not present

## 2018-01-29 DIAGNOSIS — R4181 Age-related cognitive decline: Secondary | ICD-10-CM | POA: Diagnosis not present

## 2018-01-29 DIAGNOSIS — Z981 Arthrodesis status: Secondary | ICD-10-CM | POA: Diagnosis not present

## 2018-01-29 DIAGNOSIS — S12601A Unspecified nondisplaced fracture of seventh cervical vertebra, initial encounter for closed fracture: Secondary | ICD-10-CM | POA: Diagnosis not present

## 2018-01-29 DIAGNOSIS — Z891 Acquired absence of hand and wrist: Secondary | ICD-10-CM | POA: Diagnosis not present

## 2018-01-29 DIAGNOSIS — N390 Urinary tract infection, site not specified: Secondary | ICD-10-CM | POA: Diagnosis not present

## 2018-01-29 DIAGNOSIS — E119 Type 2 diabetes mellitus without complications: Secondary | ICD-10-CM | POA: Diagnosis not present

## 2018-01-29 DIAGNOSIS — R278 Other lack of coordination: Secondary | ICD-10-CM | POA: Diagnosis not present

## 2018-01-29 DIAGNOSIS — S12690D Other displaced fracture of seventh cervical vertebra, subsequent encounter for fracture with routine healing: Secondary | ICD-10-CM | POA: Diagnosis not present

## 2018-01-29 DIAGNOSIS — S12601D Unspecified nondisplaced fracture of seventh cervical vertebra, subsequent encounter for fracture with routine healing: Secondary | ICD-10-CM | POA: Diagnosis not present

## 2018-01-29 DIAGNOSIS — M2550 Pain in unspecified joint: Secondary | ICD-10-CM | POA: Diagnosis not present

## 2018-01-29 DIAGNOSIS — J310 Chronic rhinitis: Secondary | ICD-10-CM | POA: Diagnosis not present

## 2018-01-29 DIAGNOSIS — M4712 Other spondylosis with myelopathy, cervical region: Secondary | ICD-10-CM | POA: Diagnosis not present

## 2018-01-29 DIAGNOSIS — R339 Retention of urine, unspecified: Secondary | ICD-10-CM | POA: Diagnosis not present

## 2018-01-29 LAB — GLUCOSE, CAPILLARY
Glucose-Capillary: 93 mg/dL (ref 70–99)
Glucose-Capillary: 100 mg/dL — ABNORMAL HIGH (ref 70–99)

## 2018-01-29 MED ORDER — INSULIN GLARGINE 100 UNIT/ML ~~LOC~~ SOLN: 25.0000 [IU] | Freq: Two times a day (BID) | SUBCUTANEOUS | 0 refills | Status: DC

## 2018-01-29 MED ORDER — OXYCODONE-ACETAMINOPHEN 5-325 MG PO TABS: 1.0000 | ORAL_TABLET | ORAL | 0 refills | Status: DC | PRN

## 2018-01-29 MED ORDER — INSULIN ASPART 100 UNIT/ML ~~LOC~~ SOLN: SUBCUTANEOUS | 0 refills | Status: DC

## 2018-01-29 MED ORDER — TAMSULOSIN HCL 0.4 MG PO CAPS: 0.4000 mg | ORAL_CAPSULE | Freq: Every day | ORAL | Status: DC

## 2018-01-29 MED ORDER — POLYETHYLENE GLYCOL 3350 17 G PO PACK: 17.0000 g | PACK | Freq: Two times a day (BID) | ORAL | 0 refills | Status: DC

## 2018-01-29 MED ORDER — ONDANSETRON HCL 4 MG PO TABS: 4.0000 mg | ORAL_TABLET | Freq: Three times a day (TID) | ORAL | 0 refills | Status: DC | PRN

## 2018-01-29 MED ORDER — SENNA 8.6 MG PO TABS: 1.0000 | ORAL_TABLET | Freq: Every day | ORAL | 0 refills | Status: DC

## 2018-01-29 NOTE — Discharge Instructions (Signed)
Kindly wear your c-collar at all times you can take it out during shower but keep your neck straight and do not move till you wear the collar back.  Follow with Dr. [name] in a week and then follow his instructions.    Follow with Primary MD [name], [name], MD in 7 days   Get CBC, CMP, checked  by Primary MD or SNF MD in 5-7 days   Activity: As tolerated with Full fall precautions use walker/cane & assistance as needed  Disposition SNF  Diet: Soft , with feeding assistance and aspiration precautions.   Special Instructions: If you have smoked or chewed Tobacco  in the last 2 yrs please stop smoking, stop any regular Alcohol  and or any Recreational drug use.  On your next visit with your primary care physician please Get Medicines reviewed and adjusted.  Please request your Prim.MD to go over all Hospital Tests and Procedure/Radiological results at the follow up, please get all Hospital records sent to your Prim MD by signing hospital release before you go home.  If you experience worsening of your admission symptoms, develop shortness of breath, life threatening emergency, suicidal or homicidal thoughts you must seek medical attention immediately by calling 911 or calling your MD immediately  if symptoms less severe.  You Must read complete instructions/literature along with all the possible adverse reactions/side effects for all the Medicines you take and that have been prescribed to you. Take any new Medicines after you have completely understood and accpet all the possible adverse reactions/side effects.

## 2018-01-29 NOTE — Progress Notes (Signed)
Patient will DC to: [facility]) Anticipated DC date: 01/29/18 Family notified: Daughter, Dance movement psychotherapist by: Corey Harold   Per MD patient ready for DC to [facility]. RN, patient, patient's family, and facility notified of DC. Discharge Summary and FL2 sent to facility. RN to call report prior to discharge [phone]). DC packet on chart. Ambulance transport requested for patient.   CSW will sign off for now as social work intervention is no longer needed. Please consult [place] again if new needs arise.  Cedric Fishman, LCSW Clinical Social Worker [phone]

## 2018-01-29 NOTE — Progress Notes (Signed)
[name] to be D/C'd Skilled nursing facility per MD order.  Discussed with the patient and all questions fully answered.  VSS, Skin clean, dry and intact without evidence of skin break down, no evidence of skin tears noted. IV catheter discontinued intact. Site without signs and symptoms of complications. Dressing and pressure applied.  An After Visit Summary was printed and given to the patient. Patient received prescription.  D/c education completed with patient/family including follow up instructions, medication list, d/c activities limitations if indicated, with other d/c instructions as indicated by MD - patient able to verbalize understanding, all questions fully answered.   Patient instructed to return to ED, call 911, or call MD for any changes in condition.   Patient escorted via [place], and D/C home via private auto.  Luci Bank 01/29/2018 12:28 PM

## 2018-01-29 NOTE — Progress Notes (Signed)
[employer] provided insurance approval: [place] LCSW [id number] [phone]

## 2018-01-29 NOTE — Progress Notes (Signed)
Completed I/O cath on pt. First attempt was unsuccessful. Pt had significant swelling from removal of foley. Catheter successfully inserted by a fellow nurse, Georgena Spurling. Pt was extremely anxious and irritated after completion. Emotional support given to patient and repositioned. Spoke with pts daughter over the phone and she expressed understanding of her mother's anxiety. Will continue to monitor.

## 2018-01-29 NOTE — Patient Outreach (Signed)
[facility]) Care Management  01/29/2018  [name] 1940/08/15 [id number]    RN Health Coach notified by the Hospital Liaison  that the patient has went to [facility].  A Social Worker will be assigned to follow the patient.  Plan: RN Health Coach will close the case at this time.  Lazaro Arms RN, BSN, [place] Direct Dial:  [phone]  Fax: [phone]

## 2018-01-29 NOTE — Discharge Summary (Signed)
PIERCE BIAGINI KDT:[id number] DOB: 1940/07/11 DOA: 01/21/2018  PCP: [name], [name], MD  Admit date: 01/21/2018  Discharge date: 01/29/2018  Admitted From: Home   Disposition:  SNF   Recommendations for Outpatient Follow-up:   Follow up with PCP in 1-2 weeks  PCP Please obtain BMP/CBC, 2 view CXR in 1week,  (see Discharge instructions)   PCP Please follow up on the following pending results:    Home Health: None   Equipment/Devices: None  Consultations: N.Surg Discharge Condition: Stable   CODE STATUS: Full   Diet Recommendation: Soft     Chief Complaint  Patient presents with  . Fall     Brief history of present illness from the day of admission and additional interim summary    [name] a 77 y.o.femalewithhistory of SVT, hypertension, fibromyalgia,diabetes mellitus had a fall at home while she was trying to open her door. She struck her head when she fell from a porch. Denied losing consciousness, nochest pain or palpitations.  He came to the ER where a C-spine CT scan showed an acute C7 fracture, Dr. [name] neurosurgeon on-call was consulted by the ER physician who advised a c-collar which was placed, she was subsequently admitted to the hospital.  Early this morning patient requested that she be placed in the chair as she was not comfortable in the bed, she was placed in the chair with assistance, she did fine however after about 45 minutes of sitting up in the chair she reported that she is unable to move her legs further work-up showed that she had bled into her spinal cord space and she was urgently taken to the OR by neurosurgeon Dr. [name].                                                                 Hospital Course    1.  Mechanical fall, acute C7 fracture.  Complicated by  spinal cord hematoma and edema causing cord compression.  Patient had chronic upper extremity weakness right greater than left and has now developed lateral lower extremity weakness with some continued improvement since her decompression surgery right leg now 4/5 left leg 3/5.  She is in c-collar, she was emergently taken to the OR by Dr. [name] on 01/22/2018 and underwent Posterior decompression with laminectomy C6-C7, posterior fixation C4-T1 with T1 pedicle screws and C4-C7 facet screws bilaterally posterior lateral arthrodesis with local autograft C6 and C7.  She required 2 units of packed RBC during the surgery in the OR thereafter H&H is stable, has tolerated the procedure well, continue PT OT, extremity strength has significantly improved still qualifies for SNF she will be discharged today, discussed with daughter bedside.  2.  Multiple C-spine surgeries last CT 7 to T1 fusion in September of this year, multiple  falls at home.  Had detailed discussion with patient's daughter bedside, patient has had several falls over the last several months and she had been recommended going to rehab multiple times but patient had been refusing it and been noncompliant with recommendations.  Will require SNF at discharge once bed is available.  To wear c-collar at all times except when taking shower with caution.  Follow with Dr. [name] within a week.  3.  Chronic bilateral upper extremity right more than left weakness.  Continue PT/OT, will require placement.  4.  History of SVT.  On metoprolol, verapamil and digoxin combination which will be continued.  5.  HX of restless leg syndrome along with peripheral neuropathy.  On Requip continue.  6.  Essential hypertension.  On combination of metoprolol and ACE inhibitor along with verapamil.  Continue and monitor.  7.  Chronic diastolic CHF EF 26% on echocardiogram done in November 2019.  Mildly elevated troponin and nonspecific non-ACS pattern.  No chest  pain, no shortness of breath, EKG nonacute.  Continue combination of aspirin and beta-blocker for secondary prevention and monitor.  No acute issues.   8.  GERD.  On PPI.   9.  UTI. Has completed her Keflex course.  10.  Hyponatremia.  This was SIADH improved with fluid restriction.   11. Ur.Retention and Constipation - constipation resolved after bowel regimen, she was also placed on Foley and Flomax combination, failed Foley removal on 01/28/2018, Foley to be replaced, keep her active advance activity, Foley removal trial in a week at SNF, if fails outpatient urology follow-up.    12.  Hypomagnesemia.  Replaced and stable.    13. DM type II.  On Lantus twice daily along with sliding scale will monitor.  CBGs stable for now.  CBG (last 3)  Recent Labs    01/28/18 1634 01/28/18 2114 01/29/18 0747  GLUCAP 176* 169* 93      Discharge diagnosis     Active Problems:   DM (diabetes mellitus) type II controlled, neurological manifestation (HCC)   Paroxysmal atrial tachycardia (HCC)   Hyperlipidemia   RLS (restless legs syndrome)   Weakness   Closed C7 fracture (HCC)   Cervical spine fracture [facility])    Discharge instructions    Discharge Instructions    Discharge instructions   Complete by:  As directed    Kindly wear your c-collar at all times you can take it out during shower but keep your neck straight and do not move till you wear the collar back.  Follow with Dr. [name] in a week and then follow his instructions.    Follow with Primary MD [name], [name], MD in 7 days   Get CBC, CMP, checked  by Primary MD or SNF MD in 5-7 days   Activity: As tolerated with Full fall precautions use walker/cane & assistance as needed  Disposition SNF  Diet: Soft , with feeding assistance and aspiration precautions.   Special Instructions: If you have smoked or chewed Tobacco  in the last 2 yrs please stop smoking, stop any regular Alcohol  and or any Recreational drug  use.  On your next visit with your primary care physician please Get Medicines reviewed and adjusted.  Please request your Prim.MD to go over all Hospital Tests and Procedure/Radiological results at the follow up, please get all Hospital records sent to your Prim MD by signing hospital release before you go home.  If you experience worsening of your admission symptoms, develop shortness of  breath, life threatening emergency, suicidal or homicidal thoughts you must seek medical attention immediately by calling 911 or calling your MD immediately  if symptoms less severe.  You Must read complete instructions/literature along with all the possible adverse reactions/side effects for all the Medicines you take and that have been prescribed to you. Take any new Medicines after you have completely understood and accpet all the possible adverse reactions/side effects.   Incentive spirometry RT   Complete by:  As directed       Discharge Medications   Allergies as of 01/29/2018      Reactions   Amoxil [amoxicillin] Swelling, Other (See Comments)   Lips Has patient had a PCN reaction causing immediate rash, facial/tongue/throat swelling, SOB or lightheadedness with hypotension:YES Has patient had a PCN reaction causing severe rash involving mucus membranes or skin necrosis:No Has patient had a PCN reaction that required hospitalization:No Has patient had a PCN reaction occurring within the last 10 years:Yes. If all of the above answers are "NO", then may proceed with Cephalosporin use.   Chocolate Hives   Demerol Nausea And Vomiting   Lisinopril Itching, Swelling   Meperidine    Other reaction(s): Vomiting (intolerance)   Meperidine Hcl    Other reaction(s): Vomiting (intolerance)   Other    Raw food or nuts - GI pain      Medication List    STOP taking these medications   BASAGLAR KWIKPEN 100 UNIT/ML Sopn Replaced by:  insulin glargine 100 UNIT/ML injection   liraglutide 18 MG/3ML  Sopn Commonly known as:  VICTOZA     TAKE these medications   acetaminophen 325 MG tablet Commonly known as:  TYLENOL Take 650 mg by mouth 2 (two) times daily as needed for moderate pain.   aspirin EC 81 MG tablet Take 81 mg by mouth every evening.   CALCIUM 600+D3 PO Take 1 tablet by mouth every evening.   cholecalciferol 1000 units tablet Commonly known as:  VITAMIN D Take 1,000 Units by mouth daily.   Cranberry 1000 MG Caps Take 1,000 mg by mouth 2 (two) times daily.   digoxin 0.125 MG tablet Commonly known as:  LANOXIN Take 0.125 mg by mouth daily.   insulin aspart 100 UNIT/ML injection Commonly known as:  NOVOLOG Before each meal 3 times a day, 140-199 - 2 units, 200-250 - 4 units, 251-299 - 6 units,  300-349 - 8 units,  350 or above 10 units. Dispense syringes and needles as needed, Ok to switch to PEN if approved. Substitute to any brand approved. DX DM2, Code E11.65   insulin glargine 100 UNIT/ML injection Commonly known as:  LANTUS Inject 0.25 mLs (25 Units total) into the skin 2 (two) times daily. Replaces:  BASAGLAR KWIKPEN 100 UNIT/ML Sopn   Insulin Pen Needle 32G X 6 MM Misc Commonly known as:  BD PEN NEEDLE MICRO U/F Twice a day (basaglar and victoza)   methocarbamol 500 MG tablet Commonly known as:  ROBAXIN Take 1 tablet (500 mg total) by mouth every 6 (six) hours as needed for muscle spasms.   metoprolol succinate 25 MG 24 hr tablet Commonly known as:  TOPROL-XL Take 1 tablet (25 mg total) by mouth daily. Patient needs to schedule an appointment for an additional refill What changed:  additional instructions   nitroGLYCERIN 0.4 MG SL tablet Commonly known as:  NITROSTAT Place 0.4 mg under the tongue every 5 (five) minutes as needed for chest pain.   omeprazole 20 MG capsule Commonly known  as:  PRILOSEC Take 1 capsule (20 mg total) by mouth daily. What changed:  when to take this   ondansetron 4 MG tablet Commonly known as:  ZOFRAN Take 1  tablet (4 mg total) by mouth every 8 (eight) hours as needed for nausea or vomiting.   oxyCODONE-acetaminophen 5-325 MG tablet Commonly known as:  PERCOCET/ROXICET Take 1 tablet by mouth every 4 (four) hours as needed for moderate pain or severe pain.   polyethylene glycol packet Commonly known as:  MIRALAX / GLYCOLAX Take 17 g by mouth 2 (two) times daily.   PRESERVISION AREDS 2 PO Take 1 capsule by mouth 2 (two) times daily.   ramipril 10 MG capsule Commonly known as:  ALTACE Take 1 capsule (10 mg total) by mouth every evening.   RELION INSULIN SYRINGE 1ML/31G 31G X 5/16" 1 ML Misc Generic drug:  Insulin Syringe-Needle U-100 Use as directed   rOPINIRole 1 MG tablet Commonly known as:  REQUIP TAKE ONE TABLET BY MOUTH AT BEDTIME.   senna 8.6 MG Tabs tablet Commonly known as:  SENOKOT Take 1 tablet (8.6 mg total) by mouth daily.   sertraline 100 MG tablet Commonly known as:  ZOLOFT Take 1 tablet (100 mg total) by mouth every evening. What changed:  how much to take   tamsulosin 0.4 MG Caps capsule Commonly known as:  FLOMAX Take 1 capsule (0.4 mg total) by mouth daily.   verapamil 120 MG tablet Commonly known as:  CALAN Take 120 mg by mouth daily as needed (afib).   verapamil 240 MG 24 hr capsule Commonly known as:  VERELAN PM Take 1 capsule (240 mg total) by mouth daily.       Follow-up Information    [name], [name], MD. Schedule an appointment as soon as possible for a visit in 1 week(s).   Specialty:  Family Medicine Contact information: [place] [address] [phone]        [name], MD. Schedule an appointment as soon as possible for a visit in 1 week(s).   Specialty:  Neurosurgery Contact information: [address] [place] [address] [place] [address] [phone]           Major procedures and Radiology Reports - PLEASE review detailed and final reports thoroughly  -     01/22/18 by Dr [name] -  Posterior decompression with laminectomy C6-C7, posterior fixation C4-T1 with T1 pedicle screws and C4-C7 facet screws bilaterally posterior lateral arthrodesis with local autograft C6 and C7.  CT C spine - Acute fracture of the left side of the C7 vertebral body extends into the left facet and base of the left transverse process. No other acute abnormality is identified. 0.4 cm anterolisthesis C7 on T1 is unchanged. Status post C4-6 and C7-T1 fusion.  MRI C Spine - 1. C6-7 spinal cord edema with focal dorsal intramedullary versus extramedullary/epidural hemorrhage posteriorly, favor the latter. 2. Extensive edema and hemorrhage throughout the posterior neck soft tissues. Possible posterior ligamentous complex injury at C6-7. 3. C7 vertebral and posterior element fracture better demonstrated on CT. 4. Noncompressive dorsal epidural hematoma throughout the cervical and visualized upper thoracic spine. These results were called by telephone at the time of interpretation on 01/22/2018 at 3:13 pm to Dr. [name], who verbally acknowledged these results.  TTE Nov 2019 -   Left ventricle: The cavity size was normal. Systolic function was normal. The estimated ejection fraction was in the range of 60% to 65%. Wall motion was  normal; there were no regional wall motion abnormalities. Left ventricular diastolic functionparameters were normal for the patient&'s age. - Aortic valve: Trileaflet; mildly thickened, mildly calcified leaflets. Left coronary cusp predominanly affected by calcification. Sclerosis without stenosis. There was no regurgitation. - Mitral valve: There was no significant regurgitation. - Right ventricle: Systolic function was normal. - Atrial septum: No defect or patent foramen ovale was identified. - Tricuspid valve: There was no significant regurgitation. - Pulmonic valve: There was no significant regurgitation.    Dg Cervical Spine 2-3 Views  Result Date:  01/22/2018 CLINICAL DATA:  Cervical spine decompression and fusion. EXAM: CERVICAL SPINE - 2-3 VIEW; DG C-ARM 61-120 MIN COMPARISON:  None. FINDINGS: Two C-arm views of the cervical spine demonstrate pedicle screw and rod an plate and screw fusion at multiple levels. The bone details are not adequately visualized to determine the levels involved. IMPRESSION: Operative changes, as described above. Electronically Signed   By: Claudie Revering M.D.   On: 01/22/2018 22:00   Dg Shoulder Right  Result Date: 01/21/2018 CLINICAL DATA:  Fall EXAM: RIGHT SHOULDER - 2+ VIEW COMPARISON:  07/19/2011 FINDINGS: Negative for fracture or dislocation. Mild degenerative change in the [facility] joint and shoulder joint. Mild associated spurring. IMPRESSION: Negative for fracture or dislocation. Electronically Signed   By: Franchot Gallo M.D.   On: 01/21/2018 14:59   Ct Head Wo Contrast  Result Date: 01/21/2018 CLINICAL DATA:  Patient status post C7-T1 fusion 10/24/2017. History of prior C4-6 fusion. Status post fall off a porch today. Limited range of motion of the head and new tingling in the left fingers since the fall. Initial encounter. EXAM: CT HEAD WITHOUT CONTRAST CT CERVICAL SPINE WITHOUT CONTRAST TECHNIQUE: Multidetector CT imaging of the head and cervical spine was performed following the standard protocol without intravenous contrast. Multiplanar CT image reconstructions of the cervical spine were also generated. COMPARISON:  Brain MRI 01/12/2018. Cervical spine CT scan 12/03/2017. Head CT 09/21/2017. FINDINGS: CT HEAD FINDINGS Brain: No evidence of acute infarction, hemorrhage, hydrocephalus, extra-axial collection or mass lesion/mass effect. Vascular: No hyperdense vessel or unexpected calcification. Skull: Intact.  No focal lesion. Sinuses/Orbits: Small mucous retention cyst or polyp right maxillary sinus is unchanged. The patient is status post cataract surgery bilaterally. Other: None. CT CERVICAL SPINE FINDINGS  Alignment: Approximately 0.4 cm anterolisthesis C7 on T1 is unchanged since the prior CT. Alignment is otherwise maintained. Skull base and vertebrae: The patient has an acute fracture of C7. The fracture extends from the left side of the vertebral body into the left C7 facet and base of the left transverse process. The fracture is minimally displaced at 1-2 mm. No other fracture is identified. The interbody spacer and C7 appears to be incorporating into the endplates. Soft tissues and spinal canal: No epidural hematoma is identified. Disc levels: Loss of disc space height and endplate spurring at C5-8 appear unchanged. Upper chest: Lung apices are clear. Other: None. IMPRESSION: Acute fracture of the left side of the C7 vertebral body extends into the left facet and base of the left transverse process. No other acute abnormality is identified. 0.4 cm anterolisthesis C7 on T1 is unchanged. Status post C4-6 and C7-T1 fusion. No acute intracranial normality. Electronically Signed   By: Inge Rise M.D.   On: 01/21/2018 15:32   Ct Cervical Spine Wo Contrast  Result Date: 01/21/2018 CLINICAL DATA:  Patient status post C7-T1 fusion 10/24/2017. History of prior C4-6 fusion. Status post fall off a porch today. Limited range of motion  of the head and new tingling in the left fingers since the fall. Initial encounter. EXAM: CT HEAD WITHOUT CONTRAST CT CERVICAL SPINE WITHOUT CONTRAST TECHNIQUE: Multidetector CT imaging of the head and cervical spine was performed following the standard protocol without intravenous contrast. Multiplanar CT image reconstructions of the cervical spine were also generated. COMPARISON:  Brain MRI 01/12/2018. Cervical spine CT scan 12/03/2017. Head CT 09/21/2017. FINDINGS: CT HEAD FINDINGS Brain: No evidence of acute infarction, hemorrhage, hydrocephalus, extra-axial collection or mass lesion/mass effect. Vascular: No hyperdense vessel or unexpected calcification. Skull: Intact.  No focal  lesion. Sinuses/Orbits: Small mucous retention cyst or polyp right maxillary sinus is unchanged. The patient is status post cataract surgery bilaterally. Other: None. CT CERVICAL SPINE FINDINGS Alignment: Approximately 0.4 cm anterolisthesis C7 on T1 is unchanged since the prior CT. Alignment is otherwise maintained. Skull base and vertebrae: The patient has an acute fracture of C7. The fracture extends from the left side of the vertebral body into the left C7 facet and base of the left transverse process. The fracture is minimally displaced at 1-2 mm. No other fracture is identified. The interbody spacer and C7 appears to be incorporating into the endplates. Soft tissues and spinal canal: No epidural hematoma is identified. Disc levels: Loss of disc space height and endplate spurring at Z3-2 appear unchanged. Upper chest: Lung apices are clear. Other: None. IMPRESSION: Acute fracture of the left side of the C7 vertebral body extends into the left facet and base of the left transverse process. No other acute abnormality is identified. 0.4 cm anterolisthesis C7 on T1 is unchanged. Status post C4-6 and C7-T1 fusion. No acute intracranial normality. Electronically Signed   By: Inge Rise M.D.   On: 01/21/2018 15:32   Mr Jodene Nam Head Wo Contrast  Result Date: 01/13/2018  [facility] [address] [place], [place] [address] [phone] NEUROIMAGING REPORT STUDY DATE: 01/12/2018 PATIENT NAME: ANTIONETTE LUSTER DOB: 1940-09-13 MRN: [id number] EXAM: MR angiogram of the intracranial arteries ORDERING CLINICIAN: Andrey Spearman MD CLINICAL HISTORY: 77 year old woman with vertigo and vertebrobasilar TIAs COMPARISON FILMS: None TECHNIQUE: MR angiogram of the head was obtained utilizing 3D time of flight sequences from below the vertebrobasilar junction up to the intracranial vasculature without contrast.  Computerized reconstructions were obtained. CONTRAST: None IMAGING SITE: [facility],  [place], [place], [place] FINDINGS: The imaged extracranial and intracranial portions of the internal carotid arteries appear normal. The middle cerebral appear normal.  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   In the posterior circulation, the left vertebral artery is mildly dominant.  No stenosis is noted within the vertebral arteries and the basilar arteries.  Posterior communicating arteries are noted bilaterally, right greater than left.  The right posterior artery derives most of its flow from the anterior circulation through the right posterior communicating artery.  The posterior cerebral arteries do not appear to have stenosis.. No aneurysms were identified.   This MR angiogram of the intracranial arteries shows the following: 1.    Normal variant anatomy with an absent A1 segment of the right anterior cerebral artery.  There is a fetal origin of the right posterior cerebral artery. 2.    No significant arterial stenosis.  No aneurysms.  INTERPRETING PHYSICIAN: Richard A. Felecia Shelling, MD, PhD, FAAN Certified in  Neuroimaging by [employer]   Mr Jodene Nam Neck W Lottie Dawson  Contrast  Result Date: 01/13/2018  [facility] [address], [place], [place] [address] [phone] NEUROIMAGING REPORT STUDY DATE: 01/12/2018 PATIENT NAME: ALIANNY TOELLE DOB: Nov 19, 1940 MRN: [id number] EXAM: MR Angiogram of the Neck Arteries with and without contrast ORDERING CLINICIAN: Andrey Spearman MD CLINICAL HISTORY: 77 year old woman with vertebrobasilar TIAs COMPARISON FILMS: None TECHNIQUE: MR angiogram of the neck was obtained utilizing 3D time-of-flight sequences from the aortic arch up to the intracranial vasculature postbolus contrast infusion.  2D time-of-flight noncontrast views and computerized reconstructions were obtained. CONTRAST: 19 mL MultiHance  IMAGING SITE: [facility], [place], [place], [place] FINDINGS: This study is of adequate technical quality.  There is anterograde flow in the bilateral vertebral and carotid arteries on 2D-TOF views.  The flow signal of the left subclavian artery has no stenosis.  The left common and external carotid arteries have no stenosis.  There is minimal (10 to 15%) stenosis at the origin of the left internal carotid artery.  This is hemodynamically significant.  The left vertebral artery is mildly smaller than the right.  The origin appears normal.  There is reduced flow signal in the proximal cervical segment that is most likely artifactual.  The more distal left vertebral artery has no stenosis up to the vertebrobasilar junction.  On the right, the brachiocephalic trunk and subclavian arteries have no stenosis. The right common, internal and external carotid arteries have no stenosis. The right vertebral artery has no stenosis from its origin to the vertebrobasilar junction. Limited views of the intracranial vasculature are unremarkable.   This MR angiogram of the neck arteries shows the following: 1.    Although there is reduced flow signal in the proximal cervical segment of the left vertebral artery on the source and reconstructed images, this is most likely artifactual as there is completely normal flow more distally. 2.    Minimal (less than 15%) stenosis at the origin of the left internal carotid artery.  This is not hemodynamically significant.  3.    Other arteries appear normal. INTERPRETING PHYSICIAN: Richard A. Felecia Shelling, MD, PhD, FAAN Certified in  Neuroimaging by [employer]   Mr Cervical Spine Wo Contrast  Result Date: 01/22/2018 CLINICAL DATA:  Fall with left-sided vertebral body and posterior element fractures at C7. Prior cervical fusion. Sudden lower extremity weakness. EXAM: MRI CERVICAL SPINE WITHOUT CONTRAST TECHNIQUE: Multiplanar, multisequence MR imaging of the  cervical spine was performed. No intravenous contrast was administered. COMPARISON:  Cervical spine CT 01/21/2018 and MRI 08/07/2017 FINDINGS: Alignment: Grade 1 anterolisthesis of C6 on C7 and C7 on T1, unchanged. Vertebrae: Bilateral C7 facet edema with a left-sided fracture shown on yesterday's CT. Fracture component involving the C7 vertebral body not well demonstrated by MRI with evaluation limited by artifact from C4-T1 anterior fusion plates and screws. Cord: T2 hyperintensity in the cord at C6-7 with cord expansion consistent with edema. Suspected focal hemorrhage along the posterior margin of the cord at C7 (series 8, image 26), uncertain if this is intramedullary or extramedullary in location. Noncompressive dorsal epidural hematoma throughout the cervical and included upper thoracic spine. Posterior Fossa, vertebral arteries, paraspinal tissues: Extensive edema and hematoma throughout the musculature and other soft tissues of the posterior neck bilaterally. STIR hyperintensity in the interspinous soft tissues at C6-7 with possible disruption of the ligamentum flavum. Disc levels: C2-3: Mild facet arthrosis and tiny central disc protrusion without stenosis, unchanged from the prior MRI. C3-4: Broad-based posterior disc osteophyte complex and minimal facet  arthrosis result in mild spinal stenosis and moderate to severe right and moderate left neural foraminal stenosis, unchanged from the prior MRI. C4-5: Prior ACDF.  No significant stenosis. C5-6: Prior ACDF.  No significant stenosis. C6-7: Prior ACDF. Slight anterolisthesis with epidural hematoma. Mild spinal stenosis. C7-T1: ACDF. Anterolisthesis and facet arthrosis result in moderate bilateral neural foraminal stenosis. Epidural hematoma with mild spinal stenosis. T1-2: Small right central disc protrusion, unchanged from the prior MRI and slightly deforming the ventral spinal cord. Unchanged mild left neural foraminal stenosis. T2-3: Mild disc bulging  with borderline bilateral neural foraminal stenosis and no spinal stenosis. T3-4: Small central disc extrusion mildly deforming the ventral spinal cord, similar to the prior MRI. T5-6 and T6-7: Only imaged sagittally. Small central disc herniations at both levels contacting and at most slightly deforming the spinal cord. IMPRESSION: 1. C6-7 spinal cord edema with focal dorsal intramedullary versus extramedullary/epidural hemorrhage posteriorly, favor the latter. 2. Extensive edema and hemorrhage throughout the posterior neck soft tissues. Possible posterior ligamentous complex injury at C6-7. 3. C7 vertebral and posterior element fracture better demonstrated on CT. 4. Noncompressive dorsal epidural hematoma throughout the cervical and visualized upper thoracic spine. These results were called by telephone at the time of interpretation on 01/22/2018 at 3:13 pm to Dr. [name], who verbally acknowledged these results. Electronically Signed   By: Logan Bores M.D.   On: 01/22/2018 15:49   Dg Shoulder Left  Result Date: 01/21/2018 CLINICAL DATA:  [place] down stairs EXAM: LEFT SHOULDER - 2+ VIEW COMPARISON:  None. FINDINGS: There is no evidence of fracture or dislocation. There is no evidence of arthropathy or other focal bone abnormality. Soft tissues are unremarkable. IMPRESSION: Negative. Electronically Signed   By: Franchot Gallo M.D.   On: 01/21/2018 15:00   Dg C-arm 1-60 Min  Result Date: 01/22/2018 CLINICAL DATA:  Cervical spine decompression and fusion. EXAM: CERVICAL SPINE - 2-3 VIEW; DG C-ARM 61-120 MIN COMPARISON:  None. FINDINGS: Two C-arm views of the cervical spine demonstrate pedicle screw and rod an plate and screw fusion at multiple levels. The bone details are not adequately visualized to determine the levels involved. IMPRESSION: Operative changes, as described above. Electronically Signed   By: Claudie Revering M.D.   On: 01/22/2018 22:00   Mr Brain/iac W Wo Contrast  Result Date:  01/13/2018  [facility] [address] [place], [place] [address] [phone] NEUROIMAGING REPORT STUDY DATE: 01/12/2018 PATIENT NAME: CRESSIE BETZLER DOB: [birth year] MRN: [id number] EXAM: MRI Brain with and without contrast ORDERING CLINICIAN: Andrey Spearman MD CLINICAL HISTORY: 77 year old woman with vertigo COMPARISON FILMS: None TECHNIQUE:MRI of the brain with and without contrast was obtained utilizing 5 mm axial slices with T1, T2, T2 flair, SWI and diffusion weighted views.  T1 sagittal and postcontrast views in the axial and coronal plane were obtained.  Additional thin section pre-and post contrasted axial and coronal T1-weighted and axial  CISS images through the internal auditory canals were performed. CONTRAST: 19 ml [employer] IMAGING SITE: [employer], [place]. FINDINGS: On sagittal images, the spinal cord is imaged caudally to C3 and is normal in caliber.   The contents of the posterior fossa are of normal size and position.   The pituitary gland and optic chiasm appear normal.   The third and lateral ventricles are mildly enlarged, in proportion to the extent of mild generalized cortical atrophy.  There are no abnormal extra-axial collections of fluid.  There are T2/FLAIR hyperintense foci  in the pons.  The cerebellum and deep gray matter appears normal.  In the hemispheres, there are some scattered T2/FLAIR hyperintense foci in the subcortical and deep white matter.  None of the foci appears to be acute.   Diffusion weighted images are normal.  Susceptibility weighted images are normal.   The VIIth/VIIIth nerve complexes appear normal before and after contrast.  There have been bilateral lens replacements.  The orbits are otherwise normal.  The mastoid air cells appear normal.  The paranasal sinuses appear normal.  Flow voids are identified within the major intracerebral arteries.  After the infusion of contrast material, a normal enhancement pattern is  noted.   This MRI of the brain with and without contrast with added attention to the internal auditory canal shows the following: 1.     T2/FLAIR hypertense foci in the pons and hemispheres consistent with chronic microvascular ischemic change.  None of the foci appears to be acute. 2.     Mild generalized cortical atrophy. 3.     The internal auditory canals appear normal before and after contrast. INTERPRETING PHYSICIAN: Richard A. Felecia Shelling, MD, PhD, FAAN Certified in  Neuroimaging by [employer]     Recent Results (from the past 240 hour(s))  Surgical pcr screen     Status: None   Collection Time: 01/22/18  5:48 PM  Result Value Ref Range Status   MRSA, PCR NEGATIVE NEGATIVE Final   Staphylococcus aureus NEGATIVE NEGATIVE Final    Comment: (NOTE) The Xpert SA Assay (FDA approved for NASAL specimens in patients 55 years of age and older), is one component of a comprehensive surveillance program. It is not intended to diagnose infection nor to guide or monitor treatment. Performed at [facility], [place] [address], [place], [place] [address]     Today   Subjective    Jaquesha Boroff today has no headache,no chest abdominal pain,no new weakness tingling or numbness, feels much better wants to go home today.     Objective   Blood pressure (!) 158/60, pulse 62, temperature 98.8 F (37.1 C), temperature source Oral, resp. rate 18, height 5\' 4"  (1.626 m), weight 94.3 kg, SpO2 94 %.   Intake/Output Summary (Last 24 hours) at 01/29/2018 0832 Last data filed at 01/29/2018 0108 Gross per 24 hour  Intake 222 ml  Output 2300 ml  Net -2078 ml    Exam  Awake Alert, Oriented X 3,  wearing c-collar, chronic upper extremity weakness with right more than left, lower extremity right leg 4/5 left leg 3/5 some improvement, foley in place [place].AT,PERRAL Supple Neck,No JVD, No cervical lymphadenopathy appriciated.  Symmetrical Chest wall movement, Good  air movement bilaterally, CTAB RRR,No Gallops, Rubs or new Murmurs, No Parasternal Heave +ve B.Sounds, Abd Soft, No tenderness, No organomegaly appriciated, No rebound - guarding or rigidity. Foley No Cyanosis, Clubbing or edema, No new Rash or bruise   Data Review   CBC w Diff:  Lab Results  Component Value Date   WBC 9.1 01/25/2018   HGB 10.4 (L) 01/25/2018   HGB 13.4 03/16/2016   HCT 32.0 (L) 01/25/2018   HCT 40.4 03/16/2016   PLT 167 01/25/2018   PLT 235 03/16/2016   LYMPHOPCT 6 01/21/2018   BANDSPCT 2 05/07/2012   MONOPCT 5 01/21/2018   EOSPCT 0 01/21/2018   BASOPCT 0 01/21/2018    CMP:  Lab Results  Component Value Date   NA 134 (L) 01/25/2018  NA 136 (A) 01/20/2016   K 4.2 01/25/2018   CL 100 01/25/2018   CO2 24 01/25/2018   BUN 16 01/25/2018   BUN 18 01/20/2016   CREATININE 0.86 01/25/2018   CREATININE 0.96 (H) 10/19/2017   GLU 187 01/20/2016   PROT 6.8 05/04/2017   ALBUMIN 4.2 05/04/2017   BILITOT 0.6 05/04/2017   ALKPHOS 98 05/04/2017   AST 16 05/04/2017   ALT 15 05/04/2017  .   Total Time in preparing paper work, data evaluation and todays exam - 80 minutes  Lala Lund M.D on 01/29/2018 at 8:32 AM  [facility]   Office  [phone]

## 2018-01-29 NOTE — Consult Note (Signed)
   [facility] CM Inpatient Consult   01/29/2018  [name] 02/27/1940 [id number]  Follow up:  Spoke with patient at bedside to explain [facility] Care Management services.  Patient was active with [facility] RN Health Coach. Met with patient and daughter, [name] at the bedside regarding ongoing follow up at [facility] for rehabilitation. Primary Care Provider:  [name]. Jordan   MD chart review reveals: Leah Carsonis a 77 y.o.femalewithhistory of SVT, hypertension, fibromyalgia,diabetes mellitus had a fall at home while she was trying to open her door. She struck her head when she fell from a porch. Denied losing consciousness, nochest pain or palpitations. He came to the ER where a C-spine CT scan showed an acute C7 fracture. Consent form signed by daughter, patient unable to sign.  Patient states, "I really want to get back to my home, I am going to work hard at this. It's still hurting [point at her neck] though." [facility] to continue to follow. Inpatient Case Manager team aware that [facility] Care Management following for transition needs from skilled facility to home. Of note, [facility] Care Management services does not replace or interfere with any services that are arranged by inpatient case management or social work.  For additional questions or referrals please contact:     , RN BSN CCM [facility] Hospital Liaison  [phone] business mobile phone Toll free office [phone]    

## 2018-01-29 NOTE — Progress Notes (Signed)
Pt bladder scanned and is holding 388 cc of urine. Pt refused to be I/O cath until daughter arrives at approx. 0730. Pt educated of the need for bladder to be emptied. MD made aware. Will continue to monitor.

## 2018-01-29 NOTE — Clinical Social Work Placement (Signed)
   CLINICAL SOCIAL WORK PLACEMENT  NOTE  Date:  01/29/2018  Patient Details  Name: [name] MRN: [id number] Date of Birth: 23-Apr-1940  Clinical Social Work is seeking post-discharge placement for this patient at the [place] level of care (*CSW will initial, date and re-position this form in  chart as items are completed):  Yes   Patient/family provided with [place] Work Department's list of facilities offering this level of care within the geographic area requested by the patient (or if unable, by the patient's family).  Yes   Patient/family informed of their freedom to choose among providers that offer the needed level of care, that participate in Medicare, Medicaid or managed care program needed by the patient, have an available bed and are willing to accept the patient.  Yes   Patient/family informed of [place]'s ownership interest in [facility] and [facility] as well as of the fact that they are under no obligation to receive care at these facilities.  PASRR submitted to EDS on 01/25/18     PASRR number received on 01/25/18     Existing PASRR number confirmed on       FL2 transmitted to all facilities in geographic area requested by pt/family on 01/25/18     FL2 transmitted to all facilities within larger geographic area on       Patient informed that his/her managed care company has contracts with or will negotiate with certain facilities, including the following:        Yes   Patient/family informed of bed offers received.  Patient chooses bed at [facility]     Physician recommends and patient chooses bed at      Patient to be transferred to [facility] on 01/29/18.  Patient to be transferred to facility by [facility]     Patient family notified on 01/29/18 of transfer.  Name of family member notified:  Daughter, [name]     PHYSICIAN       Additional Comment:     _______________________________________________ Benard Halsted, LCSW 01/29/2018, 11:20 AM

## 2018-01-31 ENCOUNTER — Telehealth: Payer: Self-pay

## 2018-01-31 DIAGNOSIS — R339 Retention of urine, unspecified: Secondary | ICD-10-CM | POA: Diagnosis not present

## 2018-01-31 DIAGNOSIS — I5032 Chronic diastolic (congestive) heart failure: Secondary | ICD-10-CM | POA: Diagnosis not present

## 2018-01-31 DIAGNOSIS — S12601D Unspecified nondisplaced fracture of seventh cervical vertebra, subsequent encounter for fracture with routine healing: Secondary | ICD-10-CM | POA: Diagnosis not present

## 2018-02-03 ENCOUNTER — Other Ambulatory Visit: Payer: Self-pay | Admitting: Family Medicine

## 2018-02-04 ENCOUNTER — Ambulatory Visit: Payer: Self-pay

## 2018-02-11 ENCOUNTER — Other Ambulatory Visit: Payer: Self-pay | Admitting: *Deleted

## 2018-02-11 NOTE — Patient Outreach (Signed)
[name]) Care Management  02/11/2018  [name] [birth year] [id number]   CSW received referral from [place], [name] that patient was admitted to [facility] on 01/29/18 after 8 day hospitalization at [facility] for acute C7 fracture resulting from a fall, which was complicated by spinal cord hematoma and edema causing cord compression. [facility] consent signed 01/29/18 with [place], [name]. CSW met with patient & her daughter, [name] in her room at [facility] (room#: 58). Patient reports that she was living alone in her home prior to the fall and she hopes to be able to return there at discharge from SNF. Patient shared that she has accidentally locked herself out of her home and went around back to open the door that had a doggie dog and tried to push away the door lock security bar that was propped against the door but then she fell backward and was unable to get up or use her cellphone that she had with her. Luckily though, her daughter called her about 45 minutes later to check in with her and immediately knew something was wrong and called 911. CSW spoke with patient about Life Alert, but patient states that she had one but sent it back in August as she states that it was costing her too much and never used it. CSW also briefly touched on independent or assisted living once she discharges but patient is adamant that she wants to try living at home again before making that decision. Patient is very motivated and was asking to do more therapy. Patient has a careplan meeting scheduled for this Friday at 3pm with both daughers but is anticipating a long recovery as currently she is only able to sit at the edge of the bed and has to use a hoyer lift to get out of bed. CSW to check back with patient in 1 month and encouraged daughter to call CSW with any concerns in the meantime.    Raynaldo Opitz, LCSW [facility]  Clinical Social Worker cell #: [phone]

## 2018-02-14 ENCOUNTER — Other Ambulatory Visit: Payer: Self-pay | Admitting: *Deleted

## 2018-02-14 NOTE — Patient Outreach (Signed)
[facility]) Care Management  02/14/2018  [name] [birth year] [id number]   Outreach from [name] at facility requesting HTA representative at the [place] meeting on Friday. Collaboration with [facility] UM, [name], she reports that she and [name] will represent HTA via telephone call for the Friday Care Plan meeting.  Spoke with [name], [facility] LCSW who is working with patient and family and let her know that HTA will be present on call. She plans to follow up with patient and family as indicated for discharge planning needs closer to patient discharge date.   Plan to collaborate further if any further [facility] CM needs arise.  [name]. Leah Purser, RN, BSN, [place] [phone]) Business Cell  [phone]) Toll Free Office

## 2018-02-15 ENCOUNTER — Other Ambulatory Visit: Payer: Self-pay | Admitting: *Deleted

## 2018-02-15 NOTE — Patient Outreach (Signed)
Care plan meeting for patient at [facility], attended via telephone. Therapy, SW, patient, patient daughters, Health plan members present. Goal of care plan was to discuss patient prior level of function-independent versus where she is at currently.  Patient has made good progress and over the past couple of days made excellent progress.  Upon closure of meeting, patient will continue to with therapy. May consider inpatient rehab in the future if patient continues with quick progress. Patient discharge goal is home alone and back to her independence.  [facility] Care management is following patient and will provide care management and transition of care upon discharge from [facility].  Plan to continue to collaborate with [facility] care team and [facility] UM. [name]. [name], MSN, RN, [place] [phone]) Business Cell  [phone]) Toll Free Office

## 2018-02-17 ENCOUNTER — Other Ambulatory Visit: Payer: Self-pay | Admitting: Family Medicine

## 2018-02-20 DIAGNOSIS — M4712 Other spondylosis with myelopathy, cervical region: Secondary | ICD-10-CM | POA: Diagnosis not present

## 2018-02-28 DIAGNOSIS — S12601D Unspecified nondisplaced fracture of seventh cervical vertebra, subsequent encounter for fracture with routine healing: Secondary | ICD-10-CM | POA: Diagnosis not present

## 2018-02-28 DIAGNOSIS — I5032 Chronic diastolic (congestive) heart failure: Secondary | ICD-10-CM | POA: Diagnosis not present

## 2018-02-28 DIAGNOSIS — R339 Retention of urine, unspecified: Secondary | ICD-10-CM | POA: Diagnosis not present

## 2018-03-01 ENCOUNTER — Other Ambulatory Visit: Payer: Self-pay | Admitting: *Deleted

## 2018-03-01 ENCOUNTER — Other Ambulatory Visit: Payer: Self-pay | Admitting: Family Medicine

## 2018-03-01 DIAGNOSIS — J3489 Other specified disorders of nose and nasal sinuses: Secondary | ICD-10-CM

## 2018-03-01 NOTE — Patient Outreach (Signed)
[place]  [facility]) Care Management Note: Telephonic Care plan meeting. Attendees:  [place] management: this RNCM, Raynaldo Opitz, LCSW [facility] UM: [place] and [place] employees: Clelia Croft, RN, Rehab director Patient and her 2 daughters  Patient is having slow progress but is progressing, which is a positive trend.  Patient had an appointment with neurosurgeon, Dr. [name] this week, he is requesting that the [facility] Inpatient rehab doctor review patient records to see if patient may benefit from more intense therapy. When the insurance gets a request they will review to see if patient may qualify for [facility].   Family verbalizes that they feel patient may need four months of rehab.   Patient verbalized that her goal is "home, sewing my quilts" she states that her home is handicapped accessible. She will be home alone, she has supportive daughters but they are not able to move in with patient.   Discharge plan will be for home.   Plan to continue to monitor for progress toward discharge and any [facility] care management needs.  Patient will have [facility] care management services upon discharge home in addition to any home care services that she may need.  Will continue to collaborate with [facility] care team.  [name]. [name], MSN, RN, [employer] , [place] [phone]) Business Cell  [phone]) Toll Free Office

## 2018-03-07 ENCOUNTER — Other Ambulatory Visit: Payer: Self-pay | Admitting: *Deleted

## 2018-03-07 NOTE — Patient Outreach (Signed)
[facility]) Care Management  03/07/2018  [name] [birth year] [id number]  Onsite visit to [facility] rehab. Patient in her room, she was eating lunch and a friend was visiting.  Patient very talkative.  She states she decided not to go to [facility] IR as it would be limited to around 14 days with little follow up afterward. She hopes to stay at [facility] for another few weeks and go home with home care.  Patient states her goal is home alone and she wants to be able to walk and transfer in out of chair and on/off toilet.  She is happy with her progress so far.  Patient states she is a Engineer, manufacturing systems and showed a picture of one she had made for her granddaughter.   RNCM discussed [facility] care management and we would follow up with her at home after discharge, she continues to agree to [facility] care management services.  Plan to collaborate with [facility] care team. [name]. [name], MSN, RN, [employer] , [place] [phone]) Business Cell  [phone]) Toll Free Office

## 2018-03-08 DIAGNOSIS — I10 Essential (primary) hypertension: Secondary | ICD-10-CM | POA: Diagnosis not present

## 2018-03-08 DIAGNOSIS — J310 Chronic rhinitis: Secondary | ICD-10-CM | POA: Diagnosis not present

## 2018-03-08 DIAGNOSIS — S12601D Unspecified nondisplaced fracture of seventh cervical vertebra, subsequent encounter for fracture with routine healing: Secondary | ICD-10-CM | POA: Diagnosis not present

## 2018-03-08 DIAGNOSIS — E119 Type 2 diabetes mellitus without complications: Secondary | ICD-10-CM | POA: Diagnosis not present

## 2018-03-12 DIAGNOSIS — I10 Essential (primary) hypertension: Secondary | ICD-10-CM | POA: Diagnosis not present

## 2018-03-12 DIAGNOSIS — M797 Fibromyalgia: Secondary | ICD-10-CM | POA: Diagnosis not present

## 2018-03-12 DIAGNOSIS — I5032 Chronic diastolic (congestive) heart failure: Secondary | ICD-10-CM | POA: Diagnosis not present

## 2018-03-12 DIAGNOSIS — S12601D Unspecified nondisplaced fracture of seventh cervical vertebra, subsequent encounter for fracture with routine healing: Secondary | ICD-10-CM | POA: Diagnosis not present

## 2018-03-14 ENCOUNTER — Ambulatory Visit: Payer: Self-pay | Admitting: *Deleted

## 2018-03-15 ENCOUNTER — Other Ambulatory Visit: Payer: Self-pay | Admitting: Cardiology

## 2018-03-15 DIAGNOSIS — S12601D Unspecified nondisplaced fracture of seventh cervical vertebra, subsequent encounter for fracture with routine healing: Secondary | ICD-10-CM | POA: Diagnosis not present

## 2018-03-15 DIAGNOSIS — M25551 Pain in right hip: Secondary | ICD-10-CM | POA: Diagnosis not present

## 2018-03-15 DIAGNOSIS — M2560 Stiffness of unspecified joint, not elsewhere classified: Secondary | ICD-10-CM | POA: Diagnosis not present

## 2018-03-15 MED ORDER — LANOXIN 125 MCG PO TABS: 0.1250 mg | ORAL_TABLET | Freq: Every day | ORAL | 0 refills | Status: DC

## 2018-03-15 MED ORDER — DIGOXIN 125 MCG PO TABS: 0.1250 mg | ORAL_TABLET | Freq: Every day | ORAL | 0 refills | Status: DC

## 2018-03-15 NOTE — Telephone Encounter (Signed)
30 day supply has been sent to the pharmacy as she is over due for a follow up appointment. Patient will need to schedule an appointment for further refills. Will have front desk to reach out to patient.

## 2018-03-15 NOTE — Telephone Encounter (Signed)
°  1. Which medications need to be refilled? (please list name of each medication and dose if known) Digoxin 112mcg tablet, is on lanoxin 155mcg 1QD  2. Which pharmacy/location (including street and city if local pharmacy) is medication to be sent to?[id number]  3. Do they need a 30 day or 90 day supply? [place]

## 2018-03-20 DIAGNOSIS — S12690D Other displaced fracture of seventh cervical vertebra, subsequent encounter for fracture with routine healing: Secondary | ICD-10-CM | POA: Diagnosis not present

## 2018-03-21 ENCOUNTER — Telehealth: Payer: Self-pay

## 2018-03-21 NOTE — Telephone Encounter (Signed)
Left a voicemail to call and schedule a follow up and set up a colonoscopy.

## 2018-03-21 NOTE — Telephone Encounter (Signed)
-----   Message from [place] sent at 11/14/2017  4:35 PM EDT ----- Regarding: Surv colon due  Routine colonoscopy due anytime. Needs a follow up office visit.

## 2018-03-24 ENCOUNTER — Other Ambulatory Visit: Payer: Self-pay | Admitting: Cardiology

## 2018-03-27 ENCOUNTER — Other Ambulatory Visit: Payer: Self-pay | Admitting: *Deleted

## 2018-03-27 NOTE — Progress Notes (Signed)
This encounter was created in error - please disregard.

## 2018-03-28 DIAGNOSIS — R29898 Other symptoms and signs involving the musculoskeletal system: Secondary | ICD-10-CM | POA: Diagnosis not present

## 2018-03-28 DIAGNOSIS — M25551 Pain in right hip: Secondary | ICD-10-CM | POA: Diagnosis not present

## 2018-03-28 DIAGNOSIS — M2560 Stiffness of unspecified joint, not elsewhere classified: Secondary | ICD-10-CM | POA: Diagnosis not present

## 2018-03-28 DIAGNOSIS — S12601D Unspecified nondisplaced fracture of seventh cervical vertebra, subsequent encounter for fracture with routine healing: Secondary | ICD-10-CM | POA: Diagnosis not present

## 2018-04-06 DIAGNOSIS — M19049 Primary osteoarthritis, unspecified hand: Secondary | ICD-10-CM | POA: Diagnosis not present

## 2018-04-06 DIAGNOSIS — I5032 Chronic diastolic (congestive) heart failure: Secondary | ICD-10-CM | POA: Diagnosis not present

## 2018-04-06 DIAGNOSIS — G8929 Other chronic pain: Secondary | ICD-10-CM | POA: Diagnosis not present

## 2018-04-06 DIAGNOSIS — Z794 Long term (current) use of insulin: Secondary | ICD-10-CM | POA: Diagnosis not present

## 2018-04-06 DIAGNOSIS — R41841 Cognitive communication deficit: Secondary | ICD-10-CM | POA: Diagnosis not present

## 2018-04-06 DIAGNOSIS — Z87440 Personal history of urinary (tract) infections: Secondary | ICD-10-CM | POA: Diagnosis not present

## 2018-04-06 DIAGNOSIS — E1165 Type 2 diabetes mellitus with hyperglycemia: Secondary | ICD-10-CM | POA: Diagnosis not present

## 2018-04-06 DIAGNOSIS — M810 Age-related osteoporosis without current pathological fracture: Secondary | ICD-10-CM | POA: Diagnosis not present

## 2018-04-06 DIAGNOSIS — E785 Hyperlipidemia, unspecified: Secondary | ICD-10-CM | POA: Diagnosis not present

## 2018-04-06 DIAGNOSIS — H35369 Drusen (degenerative) of macula, unspecified eye: Secondary | ICD-10-CM | POA: Diagnosis not present

## 2018-04-06 DIAGNOSIS — Z7982 Long term (current) use of aspirin: Secondary | ICD-10-CM | POA: Diagnosis not present

## 2018-04-06 DIAGNOSIS — G8191 Hemiplegia, unspecified affecting right dominant side: Secondary | ICD-10-CM | POA: Diagnosis not present

## 2018-04-06 DIAGNOSIS — I110 Hypertensive heart disease with heart failure: Secondary | ICD-10-CM | POA: Diagnosis not present

## 2018-04-06 DIAGNOSIS — G8194 Hemiplegia, unspecified affecting left nondominant side: Secondary | ICD-10-CM | POA: Diagnosis not present

## 2018-04-06 DIAGNOSIS — E538 Deficiency of other specified B group vitamins: Secondary | ICD-10-CM | POA: Diagnosis not present

## 2018-04-06 DIAGNOSIS — R339 Retention of urine, unspecified: Secondary | ICD-10-CM | POA: Diagnosis not present

## 2018-04-06 DIAGNOSIS — H9313 Tinnitus, bilateral: Secondary | ICD-10-CM | POA: Diagnosis not present

## 2018-04-06 DIAGNOSIS — Z981 Arthrodesis status: Secondary | ICD-10-CM | POA: Diagnosis not present

## 2018-04-06 DIAGNOSIS — G2581 Restless legs syndrome: Secondary | ICD-10-CM | POA: Diagnosis not present

## 2018-04-06 DIAGNOSIS — K219 Gastro-esophageal reflux disease without esophagitis: Secondary | ICD-10-CM | POA: Diagnosis not present

## 2018-04-06 DIAGNOSIS — I471 Supraventricular tachycardia: Secondary | ICD-10-CM | POA: Diagnosis not present

## 2018-04-06 DIAGNOSIS — F329 Major depressive disorder, single episode, unspecified: Secondary | ICD-10-CM | POA: Diagnosis not present

## 2018-04-06 DIAGNOSIS — S12601D Unspecified nondisplaced fracture of seventh cervical vertebra, subsequent encounter for fracture with routine healing: Secondary | ICD-10-CM | POA: Diagnosis not present

## 2018-04-06 DIAGNOSIS — M797 Fibromyalgia: Secondary | ICD-10-CM | POA: Diagnosis not present

## 2018-04-08 ENCOUNTER — Telehealth: Payer: Self-pay | Admitting: Family Medicine

## 2018-04-08 NOTE — Telephone Encounter (Signed)
Copied from [place] [phone]. Topic: Quick Communication - Home Health Verbal Orders >> Apr 08, 2018  3:52 PM Rayann Heman wrote: Caller/Agency: Law[facility] Ion at home  Callback Number: [phone] Requesting PT Frequency: 1x1 2x5

## 2018-04-09 NOTE — Telephone Encounter (Signed)
Spoke with [name] and gave verbal orders for PT as requested.

## 2018-04-11 ENCOUNTER — Other Ambulatory Visit: Payer: Self-pay

## 2018-04-11 NOTE — Patient Outreach (Signed)
[place] [facility]) Care Management  04/11/2018  [name] October 10, 1940 [id number]   Referral Date: 04/08/2018 Referral Source: HTA report Date of Admission: 01/29/18 Diagnosis: Neck Fracture Date of Discharge: 04/04/2018 Facility: [facility]:  HTA  Outreach attempt: spoke with patient and daughter.  Patient gives permission to speak with daughter.  Daughter [name] states patient is doing ok.  She states that patient has PT involved.  Daughter has sitters set up with patient but does have some vacancies that she is trying to fill.  Discussed resources and having social worker Raynaldo Opitz to call to see if there are any other resources.  She is agreeable.  Daughter has taken some FMLA time from her job but states it is limited.    Per patient record she has a history of SVT, hypertension, fibromyalgia,and diabetes mellitus.  Patient has appointment with endocrinologist on tomorrow to review patient progress.  Daughter states that patient diet has changed since patient broke her neck and feels like her A1C will be better.  Patient takes Lantus 25 units BID which patient cannot give on her own due to neck fracture and complications.  Daughter states that the private sitters she has are giving patient her insulin but the agency sitters cannot and she is trying to get someone to come in to do that whether it be a neighbor or someone from the agency that is skilled.  She states that patient is ambulating with rolling walker and has the necessities for her care.  Patient has several follow up appointments to physicians in the next couple of weeks and daughter will be transporting.    Discussed [facility] services.  She declined nurse and pharmacy need at this time but would like for social worker to contact her again to see if there are any other resources for care.    Plan: RN CM will in basket involved social worker to outreach daughter for any additional resources.   RN  CM will sign off case.     Jone Baseman, RN, MSN [facility] Care Management Care Management Coordinator Direct Line [phone] Toll Free: [phone]  Fax: [phone]

## 2018-04-12 ENCOUNTER — Other Ambulatory Visit: Payer: Self-pay

## 2018-04-12 ENCOUNTER — Ambulatory Visit: Payer: PPO | Admitting: Cardiology

## 2018-04-12 ENCOUNTER — Encounter: Payer: Self-pay | Admitting: Internal Medicine

## 2018-04-12 ENCOUNTER — Ambulatory Visit (INDEPENDENT_AMBULATORY_CARE_PROVIDER_SITE_OTHER): Payer: PPO | Admitting: Internal Medicine

## 2018-04-12 VITALS — BP 120/78 | HR 75 | Ht 64.0 in | Wt 197.2 lb

## 2018-04-12 DIAGNOSIS — Z794 Long term (current) use of insulin: Secondary | ICD-10-CM

## 2018-04-12 DIAGNOSIS — E1142 Type 2 diabetes mellitus with diabetic polyneuropathy: Secondary | ICD-10-CM

## 2018-04-12 LAB — POCT GLYCOSYLATED HEMOGLOBIN (HGB A1C): Hemoglobin A1C: 6.2 % — AB (ref 4.0–5.6)

## 2018-04-12 MED ORDER — INSULIN ASPART 100 UNIT/ML FLEXPEN: 8.0000 [IU] | PEN_INJECTOR | Freq: Three times a day (TID) | SUBCUTANEOUS | 11 refills | Status: DC

## 2018-04-12 MED ORDER — INSULIN GLARGINE 100 UNIT/ML SOLOSTAR PEN: 25.0000 [IU] | PEN_INJECTOR | Freq: Every day | SUBCUTANEOUS | 11 refills | Status: DC

## 2018-04-12 NOTE — Progress Notes (Signed)
Name: MAURISHA MONGEAU  Age/ Sex: 78 y.o., female   MRN/ DOB: [id number], 1940-07-27     PCP: [name], [name], MD   Reason for Endocrinology Evaluation: Type 2 diabetes Mellitus  Initial Endocrine Consultative Visit:  12/28/2017    PATIENT IDENTIFIER: Ms. TAWNEY VANORMAN is a 78 y.o. female with a past medical history of PAT, T2 DM, and urinary incontinence. The patient has followed with Endocrinology clinic since 12/28/2017 for consultative assistance with management of her diabetes.  DIABETIC HISTORY:  Ms. Grupp was diagnosed with T2DM > 10 years ago.  She is intolerant to metformin due to fecal incontinence on 2000 mg a day. Her hemoglobin A1c has ranged from 6.7% in 09/01/2017, peaking at 7.9% in 2011.   SUBJECTIVE:   During the last visit (12/28/2017): Her A1c was 7.6%, we continued Basaglar at 65 units daily, we started Victoza at 0.6 mg daily   Today (04/14/2018): Ms. Duey is here for a follow up for diabetes management. She is with her daughter [name].  I last saw her in November 2019, she was supposed to have a short-term follow-up but unfortunately fell and have a cervical spine fracture.  She has been at the nursing home for this long.  She was recently discharged home.  She checks her blood sugars 2 times daily, preprandial to breakfast and bedtime. The patient has not had hypoglycemic episodes since the last clinic visit. Otherwise, the patient has not required any recent emergency interventions for hypoglycemia and has not had recent hospitalizations secondary to hyper or hypoglycemic episodes.  She was discharged from the nursing home on the basal/bolus insulin regimen.   ROS: As per HPI and as detailed below: Review of Systems  Respiratory: Negative.   Cardiovascular: Negative.   Skin: Negative.  Rash:    Neurological: Positive for tingling and focal weakness.      HOME DIABETES REGIMEN:    Lantus 25 units BID  Novolog SS   Statin: No ACE-I/ARB: Yes    Glucose log:     DIABETIC COMPLICATIONS: Microvascular complications:   Neuropathy   Denies: retinopathy, nephropathy   Last eye exam: Completed 2019  Macrovascular complications:    Denies: CAD, PVD, CVA   HISTORY:  Past Medical History:  Past Medical History:  Diagnosis Date  . Anemia    during 1st pregnancy  . Anxiety   . Arthritis   . Complication of anesthesia   . Depression   . Diarrhea, functional   . Dysrhythmia    Paroysmal Atrial Tachycardia  . Essential hypertension   . Fibromyalgia   . GERD (gastroesophageal reflux disease)   . H/O acute pancreatitis   . Headache   . History of hiatal hernia   . Hyperlipemia   . Osteoporosis   . PAT (paroxysmal atrial tachycardia) ([place])   . Pneumonia   . PONV (postoperative nausea and vomiting)   . PSVT (paroxysmal supraventricular tachycardia) ([facility] [place])   . Reflux   . Restless leg syndrome   . Type 2 diabetes mellitus (HCC)    Type II  . Vertigo    Past Surgical History:  Past Surgical History:  Procedure Laterality Date  . ABDOMINAL HYSTERECTOMY    . ANKLE SURGERY    . ANTERIOR CERVICAL DECOMP/DISCECTOMY FUSION N/A 11/12/2017   Procedure: Cervical seven to Thoracic one  Anterior cervical decompression/discectomy/fusion with removal of old plate;  Surgeon: [name], MD;  Location: [facility];  Service: Neurosurgery;  Laterality: N/A;  . BACK SURGERY  T12-L1 fusion, Anterior Cervical fusion  . CARDIAC CATHETERIZATION N/A 02/16/2015   Procedure: Left Heart Cath and Coronary Angiography;  Surgeon: Sherren Mocha, MD;  Location: [facility];  Service: Cardiovascular;  Laterality: N/A;  . CHOLECYSTECTOMY    . COLONOSCOPY    . ESOPHAGOGASTRODUODENOSCOPY  12/21/2010   Procedure: ESOPHAGOGASTRODUODENOSCOPY (EGD);  Surgeon: Landry Dyke, MD;  Location: [facility];  Service: Endoscopy;  Laterality: N/A;  . EXCISION MORTON'S NEUROMA     . EYE SURGERY Bilateral    Cataract  . FRACTURE SURGERY     ankle x 2   . KNEE SURGERY     x 3  . NECK SURGERY    . POSTERIOR CERVICAL FUSION/FORAMINOTOMY N/A 01/22/2018   Procedure: Cervical six-seven Posterior Decompression with Posterior Fixation Cervical Five to Thoracic Two;  Surgeon: [name], MD;  Location: [facility];  Service: Neurosurgery;  Laterality: N/A;  Cervical 6-7 Posteror decompression with posterior fixation Cervical 5 to Thoracic 2  . ROTATOR CUFF REPAIR    . TONSILLECTOMY    . TOTAL KNEE ARTHROPLASTY Left 04/10/2017   Procedure: LEFT TOTAL KNEE ARTHROPLASTY;  Surgeon: Paralee Cancel, MD;  Location: [facility];  Service: Orthopedics;  Laterality: Left;  70 mins  . UPPER GASTROINTESTINAL ENDOSCOPY    . WRIST SURGERY      Social History:  reports that she has quit smoking. She has never used smokeless tobacco. She reports that she does not drink alcohol or use drugs. Family History:  Family History  Problem Relation Age of Onset  . Heart failure Mother   . Hyperlipidemia Mother   . Lung cancer Father        Lymphoma  . Hyperlipidemia Maternal Grandmother   . Peripheral vascular disease Maternal Grandmother      HOME MEDICATIONS: Allergies as of 04/12/2018      Reactions   Amoxil [amoxicillin] Swelling, Other (See Comments)   Lips Has patient had a PCN reaction causing immediate rash, facial/tongue/throat swelling, SOB or lightheadedness with hypotension:YES Has patient had a PCN reaction causing severe rash involving mucus membranes or skin necrosis:No Has patient had a PCN reaction that required hospitalization:No Has patient had a PCN reaction occurring within the last 10 years:Yes. If all of the above answers are "NO", then may proceed with Cephalosporin use.   Chocolate Hives   Demerol Nausea And Vomiting   Lisinopril Itching, Swelling   Meperidine    Other reaction(s): Vomiting (intolerance)   Meperidine Hcl    Other reaction(s): Vomiting (intolerance)    Other    Raw food or nuts - GI pain      Medication List       Accurate as of April 12, 2018 11:59 PM. Always use your most recent med list.        acetaminophen 325 MG tablet Commonly known as:  TYLENOL Take 650 mg by mouth 2 (two) times daily as needed for moderate pain.   aspirin EC 81 MG tablet Take 81 mg by mouth every evening.   CALCIUM 600+D3 PO Take 1 tablet by mouth every evening.   cholecalciferol 1000 units tablet Commonly known as:  VITAMIN D Take 1,000 Units by mouth daily.   Cranberry 1000 MG Caps Take 1,000 mg by mouth 2 (two) times daily.   digoxin 0.125 MG tablet Commonly known as:  LANOXIN Take 1 tablet (0.125 mg total) by mouth daily. Patient needs appointment for further refills   insulin aspart 100 UNIT/ML FlexPen Commonly known as:  NOVOLOG FLEXPEN Inject 8 Units into the skin 3 (three) times daily with meals.   Insulin Glargine 100 UNIT/ML Solostar Pen Commonly known as:  LANTUS SOLOSTAR Inject 25 Units into the skin daily.   Insulin Pen Needle 32G X 6 MM Misc Commonly known as:  BD PEN NEEDLE MICRO U/F Twice a day (basaglar and victoza)   methocarbamol 500 MG tablet Commonly known as:  ROBAXIN Take 1 tablet (500 mg total) by mouth every 6 (six) hours as needed for muscle spasms.   metoprolol succinate 25 MG 24 hr tablet Commonly known as:  TOPROL-XL Take 1 tablet (25 mg total) by mouth daily. Patient needs to schedule an appointment for an additional refill   nitroGLYCERIN 0.4 MG SL tablet Commonly known as:  NITROSTAT Place 0.4 mg under the tongue every 5 (five) minutes as needed for chest pain.   omeprazole 20 MG capsule Commonly known as:  PRILOSEC Take 1 capsule (20 mg total) by mouth daily.   ondansetron 4 MG tablet Commonly known as:  ZOFRAN Take 1 tablet (4 mg total) by mouth every 8 (eight) hours as needed for nausea or vomiting.   oxyCODONE-acetaminophen 5-325 MG tablet Commonly known as:  PERCOCET/ROXICET Take 1  tablet by mouth every 4 (four) hours as needed for moderate pain or severe pain.   polyethylene glycol packet Commonly known as:  MIRALAX / GLYCOLAX Take 17 g by mouth 2 (two) times daily.   PRESERVISION AREDS 2 PO Take 1 capsule by mouth 2 (two) times daily.   ramipril 10 MG capsule Commonly known as:  ALTACE TAKE 1 CAPSULE (10 MG TOTAL) BY MOUTH EVERY EVENING.   RELION INSULIN SYRINGE 1ML/31G 31G X 5/16" 1 ML Misc Generic drug:  Insulin Syringe-Needle U-100 Use as directed   rOPINIRole 1 MG tablet Commonly known as:  REQUIP TAKE ONE TABLET BY MOUTH AT BEDTIME.   senna 8.6 MG Tabs tablet Commonly known as:  SENOKOT Take 1 tablet (8.6 mg total) by mouth daily.   sertraline 100 MG tablet Commonly known as:  ZOLOFT Take 1 tablet (100 mg total) by mouth every evening.   tamsulosin 0.4 MG Caps capsule Commonly known as:  FLOMAX Take 1 capsule (0.4 mg total) by mouth daily.   verapamil 120 MG tablet Commonly known as:  CALAN Take 120 mg by mouth daily as needed (afib).   verapamil 240 MG 24 hr capsule Commonly known as:  VERELAN PM Take 1 capsule (240 mg total) by mouth daily.        OBJECTIVE:   Vital Signs: BP 120/78 (BP Location: Right Arm, Patient Position: Sitting, Cuff Size: Large)   Pulse 75   Ht 5\' 4"  (1.626 m)   Wt 197 lb 3.2 oz (89.4 kg)   SpO2 98%   BMI 33.85 kg/m   Wt Readings from Last 3 Encounters:  04/12/18 197 lb 3.2 oz (89.4 kg)  01/22/18 207 lb 14.3 oz (94.3 kg)  12/28/17 208 lb (94.3 kg)     Exam: General: Pt appears well and is in NAD  Neck: General: Supple without adenopathy. Thyroid: Thyroid size normal.  No goiter or nodules appreciated. No thyroid bruit.  Lungs: Clear with good BS bilat with no rales, rhonchi, or wheezes  Heart: RRR with normal S1 and S2 and no gallops; no murmurs; no rub  Abdomen: Normoactive bowel sounds, soft, nontender, without masses or organomegaly palpable  Extremities: No pretibial edema. No tremor. Normal  strength and motion throughout. See detailed diabetic foot  exam below.  Skin: Normal texture and temperature to palpation. No rash noted. No Acanthosis nigricans/skin tags. No lipohypertrophy.  Neuro: MS is good with appropriate affect, pt is alert and Ox3    DM foot exam:04/12/2018 The skin of the feet is intact without sores or ulcerations. The pedal pulses are 2+ on right and 2+ on left. The sensation is intact to a screening 5.07, 10 gram monofilament bilaterally            DATA REVIEWED:  Lab Results  Component Value Date   HGBA1C 6.2 (A) 04/12/2018   HGBA1C 7.6 (H) 11/12/2017   HGBA1C 6.7 (H) 09/04/2017   Lab Results  Component Value Date   MICROALBUR 26.1 (H) 01/12/2017   LDLCALC 82 01/20/2016   CREATININE 0.86 01/25/2018   Lab Results  Component Value Date   MICRALBCREAT 14.1 01/12/2017    No results found for: FRUCTOSAMINE   Lab Results  Component Value Date   CHOL 151 01/20/2016   HDL 27 (A) 01/20/2016   LDLCALC 82 01/20/2016   TRIG 438 (A) 01/20/2016   CHOLHDL 5.9 02/15/2015         ASSESSMENT / PLAN / RECOMMENDATIONS:   1) Type 2 Diabetes Mellitus, With  neuropathic complications - Most recent A1c of 6.2% %. Goal A1c < 7.0 %.    -When I initially saw her in November 2019 I continued her Lantus and started her on 0.6 mg of Victoza daily.  Patient never started the Victoza, patient tells me she has a distant cousin who was diagnosed with thyroid cancer, she is not really sure what type of cancer the distant cousin has, she also tells me she has a first-degree family member with GERD.  It is very clear that the patient is very skeptical about starting a GLP-1 agonist.  I have tried to reassure her that GLP-1 agonist has been found to cause medullary thyroid cancer in rats.  And that GI side effects from GLP-1 agonist are variable from one person to the other.  But due to her concerns with GLP-1 agonist, we will not try and start this on her. -She is also  intolerant to metformin. -I have discussed with her that we typically add insulin sensitizer used to reduce the amount of insulin use, but due to her intolerance to metformin, and reluctance to start any GLP-1 agonist at this time we are unfortunately limited in adding any insulin sensitizers and we will have to continue with MDI regimen. -Her daughter declined a CDE referral today, I expressed to the patient and her daughter that if the patient knows what her carbohydrates are and knows how she can limit carbohydrate intake then she can forego seeing a CDE at this time. -Her A1c today is 6.2%, even though this may seem optimal, but in a person who is on multiple dose injections of insulin, there is a big concern with low A1c's due to the high risk of hypoglycemia. - Discussed pharmacokinetics of basal/bolus insulin and the importance of taking prandial insulin with meals.  We also discussed avoiding sugar-sweetened beverages and snacks, when possible.    MEDICATIONS:  Lantus 25 units once a day  NovoLog 8 units 3 times daily q. before meals  EDUCATION / INSTRUCTIONS:  BG monitoring instructions: Patient is instructed to check her blood sugars 4 times a day, before meals and bedtime.  Call [facility] if: BG persistently < 70 or > 300. . I reviewed the Rule of 15 for the  treatment of hypoglycemia in detail with the patient. Literature supplied.    2) Diabetic complications:   Eye: Does not have known diabetic retinopathy.   Neuro/ Feet: Does have known diabetic peripheral neuropathy .   Renal: Patient does not have known baseline CKD. She   is on an ACEI/ARB at present.   3) Lipids: Patient used to be on Zetia .  Somehow she has not been on it since her discharge from the nursing home.  Unclear benefit of statins in individuals who over 29 years of age.  I will defer this discussion to the PCP and the patient.  4) Hypertension: She is at goal of < 140/90 mmHg.     F/U in 2 weeks   Signed electronically by: Mack Guise, MD  [facility]  [facility] [address] [place], [place] [address] Phone: [phone] FAX: [phone]   CC: [name], [name], [place] [address] Phone: [phone]  Fax: [phone]  Return to Endocrinology clinic as below: Future Appointments  Date Time Provider [place]  04/15/2018  1:45 PM Liliane Shi, PA-C [facility]  04/22/2018  2:00 PM [name], [name], MD [facility]  04/26/2018  1:20 PM Shaneisha Burkel, Melanie Crazier, MD [facility] None  06/24/2018  2:00 PM Penumalli, Earlean Polka, MD [facility] None  09/10/2018  3:00 PM [facility]

## 2018-04-12 NOTE — Patient Instructions (Signed)
-   Lantus 25 units ONCE a day  - Novolog 8 units , three times a day with meals  - If Your are NOT going to eat Carbohydrates, DO NOT take Novolog with a meal    - Check sugar before meals and bedtime    Choose healthy, lower carb lower calorie snacks: toss salad, cooked vegetables, cottage cheese, peanut butter, low fat cheese / string cheese, lower sodium deli meat, tuna salad or chicken salad    HOW TO TREAT LOW BLOOD SUGARS (Blood sugar LESS THAN 70 MG/DL)  Please follow the RULE OF 15 for the treatment of hypoglycemia treatment (when your (blood sugars are less than 70 mg/dL)    STEP 1: Take 15 grams of carbohydrates when your blood sugar is low, which includes:   3-4 GLUCOSE TABS  OR  3-4 OZ OF JUICE OR REGULAR SODA OR  ONE TUBE OF GLUCOSE GEL     STEP 2: RECHECK blood sugar in 15 MINUTES STEP 3: If your blood sugar is still low at the 15 minute recheck --> then, go back to STEP 1 and treat AGAIN with another 15 grams of carbohydrates.

## 2018-04-15 ENCOUNTER — Encounter: Payer: Self-pay | Admitting: Cardiology

## 2018-04-15 ENCOUNTER — Other Ambulatory Visit: Payer: Self-pay | Admitting: Dermatology

## 2018-04-15 ENCOUNTER — Ambulatory Visit (INDEPENDENT_AMBULATORY_CARE_PROVIDER_SITE_OTHER): Payer: PPO | Admitting: Cardiology

## 2018-04-15 ENCOUNTER — Telehealth: Payer: Self-pay | Admitting: Family Medicine

## 2018-04-15 VITALS — BP 110/56 | HR 56 | Wt 196.0 lb

## 2018-04-15 DIAGNOSIS — L820 Inflamed seborrheic keratosis: Secondary | ICD-10-CM | POA: Diagnosis not present

## 2018-04-15 DIAGNOSIS — L570 Actinic keratosis: Secondary | ICD-10-CM | POA: Diagnosis not present

## 2018-04-15 DIAGNOSIS — I471 Supraventricular tachycardia: Secondary | ICD-10-CM | POA: Diagnosis not present

## 2018-04-15 DIAGNOSIS — I10 Essential (primary) hypertension: Secondary | ICD-10-CM

## 2018-04-15 DIAGNOSIS — E1159 Type 2 diabetes mellitus with other circulatory complications: Secondary | ICD-10-CM | POA: Diagnosis not present

## 2018-04-15 DIAGNOSIS — D485 Neoplasm of uncertain behavior of skin: Secondary | ICD-10-CM | POA: Diagnosis not present

## 2018-04-15 DIAGNOSIS — D2221 Melanocytic nevi of right ear and external auricular canal: Secondary | ICD-10-CM | POA: Diagnosis not present

## 2018-04-15 DIAGNOSIS — Z79899 Other long term (current) drug therapy: Secondary | ICD-10-CM

## 2018-04-15 DIAGNOSIS — D229 Melanocytic nevi, unspecified: Secondary | ICD-10-CM | POA: Diagnosis not present

## 2018-04-15 NOTE — Telephone Encounter (Signed)
Copied from [place] [id number]. Topic: Quick Communication - See Telephone Encounter >> Apr 15, 2018  9:56 AM Ahmed Prima L wrote: CRM for notification. See Telephone encounter for: 04/15/18.  Patient's daughter [name] called and said she will not have enough tamsulosin (FLOMAX) 0.4 MG CAPS capsule to last her until her appt on Monday, March 9th. She was sent home from the nursing facility with this. She has enough to last her until Friday, March 6th. Please Advise.   [id number] - [place], [place] - [address] [place] [address] Phone: [phone] Fax: [phone]

## 2018-04-15 NOTE — Patient Instructions (Addendum)
Medication Instructions:   Your physician recommends that you continue on your current medications as directed. Please refer to the Current Medication list given to you today.  If you need a refill on your cardiac medications before your next appointment, please call your pharmacy.   Lab work: NONE ORDERED  TODAY    If you have labs (blood work) drawn today and your tests are completely normal, you will receive your results only by: Marland Kitchen MyChart Message (if you have MyChart) OR . A paper copy in the mail If you have any lab test that is abnormal or we need to change your treatment, we will call you to review the results.  Testing/Procedures:    Follow-Up: At [facility], you and your health needs are our priority.  As part of our continuing mission to provide you with exceptional heart care, we have created designated Provider Care Teams.  These Care Teams include your primary Cardiologist (physician) and Advanced Practice Providers (APPs -  Physician Assistants and Nurse Practitioners) who all work together to provide you with the care you need, when you need it. You will need a follow up appointment in 6 months Dr. Marlou Porch Kathyrn Drown, NP  Please call our office 2 months in advance to schedule this appointment.  You may see or one of the following Advanced Practice Providers on your designated Care Team:     Any Other Special Instructions Will Be Listed Below (If Applicable).

## 2018-04-15 NOTE — Progress Notes (Signed)
Cardiology Office Note   Date:  04/15/2018   ID:  [name], DOB 1941-02-13, MRN [id number]  PCP:  [name], [name], MD  Cardiologist: Dr. Candee Furbish, MD  Chief Complaint  Patient presents with  . Follow-up  . Irregular Heart Beat   History of Present Illness: [name] is a 78 y.o. female who presents for  SVT follow up, seen for Dr. Marlou Porch.   [name] was previously followed by Dr. Wynonia Lawman. She has a prior hx of SVT, hypertension, fibromyalgia, anxiety and diabetes mellitus. She was recently hospitalized for an extended time s/p fall at home while she was trying to open her door.She struck her head when she fell from a porch. Denied losing consciousness, nochest pain or palpitations. He came to the ER where a C-spine CT scan showed an acute C7 fracture, Dr. [name] neurosurgeon on-call was consulted by the ER physician who advised a c-collar which was placed, she was subsequently admitted to the hospital.On one occasion she reported that she is unable to move her legs in which further work-up showed that she had bled into her spinal cord space and she was urgently taken to the OR by neurosurgeon Dr. [name]. She was discharged on 01/29/2018 to SNF for rehabilitation.  She was last seen by Dr. Wynonia Lawman for pre-operative clearance for knee surgery 03/09/2017. She was noted to have a prior hx of SVT that was medically managed. In the past, she had significant issues with chest discomfort and underwent a cath in 2017 which showed no significant CAD with an EF of 60%. During her last office visit, she denied PND, orthopnea, or edema. She had not reported any recent SVT. She was cleared for knee surgery at that time.   Recently discharged home from SNF two weeks ago. She now stays at home with 24h care. She has had no cardiac concerns. She denies SOB, palpitations, dizziness or anginal symptoms, LE swelling or orthopnea symptoms. She has been doing very well with PT and OT. She was  unable to walk at hospital discharge to SNF and now walks with a walker. She continues to have some hand limitations as well as neck mobility issues. She presented today with her daughter who serves as her caretaker. She is awaiting insurance approval for continued home PT and OT. She is to see her PCP next week for post SNF follow up and will have extensive labs drawn. Her Hb A1c was noted to 6.2 on 04/12/2018. She is on SQ insulin.    Past Medical History:  Diagnosis Date  . Anemia    during 1st pregnancy  . Anxiety   . Arthritis   . Complication of anesthesia   . Depression   . Diarrhea, functional   . Dysrhythmia    Par[place]mal Atrial Tachycardia  . Essential hypertension   . Fibromyalgia   . GERD (gastroesophageal reflux disease)   . H/O acute pancreatitis   . Headache   . History of hiatal hernia   . Hyperlipemia   . Osteoporosis   . PAT (paroxysmal atrial tachycardia) ([place])   . Pneumonia   . PONV (postoperative nausea and vomiting)   . PSVT (paroxysmal supraventricular tachycardia) ([place])   . Reflux   . Restless leg syndrome   . Type 2 diabetes mellitus (HCC)    Type II  . Vertigo     Past Surgical History:  Procedure Laterality Date  . ABDOMINAL HYSTERECTOMY    . ANKLE SURGERY    .  ANTERIOR CERVICAL DECOMP/DISCECTOMY FUSION N/A 11/12/2017   Procedure: Cervical seven to Thoracic one  Anterior cervical decompression/discectomy/fusion with removal of old plate;  Surgeon: [name], MD;  Location: [facility];  Service: Neurosurgery;  Laterality: N/A;  . BACK SURGERY     T12-L1 fusion, Anterior Cervical fusion  . CARDIAC CATHETERIZATION N/A 02/16/2015   Procedure: Left Heart Cath and Coronary Angiography;  Surgeon: Sherren Mocha, MD;  Location: [facility];  Service: Cardiovascular;  Laterality: N/A;  . CHOLECYSTECTOMY    . COLONOSCOPY    . ESOPHAGOGASTRODUODENOSCOPY  12/21/2010   Procedure: ESOPHAGOGASTRODUODENOSCOPY (EGD);  Surgeon: Landry Dyke, MD;  Location:  [facility];  Service: Endoscopy;  Laterality: N/A;  . EXCISION MORTON'S NEUROMA    . EYE SURGERY Bilateral    Cataract  . FRACTURE SURGERY     ankle x 2   . KNEE SURGERY     x 3  . NECK SURGERY    . POSTERIOR CERVICAL FUSION/FORAMINOTOMY N/A 01/22/2018   Procedure: Cervical six-seven Posterior Decompression with Posterior Fixation Cervical Five to Thoracic Two;  Surgeon: [name], MD;  Location: [facility];  Service: Neurosurgery;  Laterality: N/A;  Cervical 6-7 Posteror decompression with posterior fixation Cervical 5 to Thoracic 2  . ROTATOR CUFF REPAIR    . TONSILLECTOMY    . TOTAL KNEE ARTHROPLASTY Left 04/10/2017   Procedure: LEFT TOTAL KNEE ARTHROPLASTY;  Surgeon: Paralee Cancel, MD;  Location: [facility];  Service: Orthopedics;  Laterality: Left;  70 mins  . UPPER GASTROINTESTINAL ENDOSCOPY    . WRIST SURGERY       Current Outpatient Medications  Medication Sig Dispense Refill  . acetaminophen (TYLENOL) 325 MG tablet Take 325 mg by mouth 2 (two) times daily as needed for moderate pain.     Marland Kitchen aspirin EC 81 MG tablet Take 81 mg by mouth every evening.    . Calcium Carb-Cholecalciferol (CALCIUM 600+D3 PO) Take 1 tablet by mouth every evening.    . cholecalciferol (VITAMIN D) 1000 units tablet Take 1,000 Units by mouth daily.    . Cranberry 1000 MG CAPS Take 1,000 mg by mouth 2 (two) times daily.     . digoxin (LANOXIN) 0.125 MG tablet Take 1 tablet (0.125 mg total) by mouth daily. Patient needs appointment for further refills 30 tablet 0  . ezetimibe (ZETIA) 10 MG tablet Take 10 mg by mouth daily.    . Insulin Pen Needle (BD PEN NEEDLE MICRO U/F) 32G X 6 MM MISC Twice a day (basaglar and victoza) 100 each 6  . methocarbamol (ROBAXIN) 500 MG tablet Take 1 tablet (500 mg total) by mouth every 6 (six) hours as needed for muscle spasms. 40 tablet 3  . metoprolol succinate (TOPROL-XL) 25 MG 24 hr tablet Take 1 tablet (25 mg total) by mouth daily. Patient needs to schedule an appointment for  an additional refill 90 tablet 0  . Multiple Vitamins-Minerals (PRESERVISION AREDS 2 PO) Take 1 capsule by mouth 2 (two) times daily.    . nitroGLYCERIN (NITROSTAT) 0.4 MG SL tablet Place 0.4 mg under the tongue every 5 (five) minutes as needed for chest pain.     Marland Kitchen omeprazole (PRILOSEC) 20 MG capsule Take 1 capsule (20 mg total) by mouth daily. (Patient taking differently: Take 20 mg by mouth at bedtime. ) 90 capsule 2  . ondansetron (ZOFRAN) 4 MG tablet Take 1 tablet (4 mg total) by mouth every 8 (eight) hours as needed for nausea or vomiting. 20 tablet 0  .  ramipril (ALTACE) 10 MG capsule TAKE 1 CAPSULE (10 MG TOTAL) BY MOUTH EVERY EVENING. 90 capsule 0  . RELION INSULIN SYRINGE 1ML/31G 31G X 5/16" 1 ML MISC Use as directed 100 each 1  . rOPINIRole (REQUIP) 1 MG tablet TAKE ONE TABLET BY MOUTH AT BEDTIME. (Patient taking differently: Take 1 mg by mouth at bedtime. ) 90 tablet 1  . tamsulosin (FLOMAX) 0.4 MG CAPS capsule Take 1 capsule (0.4 mg total) by mouth daily. 30 capsule   . verapamil (CALAN) 120 MG tablet Take 120 mg by mouth daily as needed (afib).     . verapamil (VERELAN PM) 240 MG 24 hr capsule Take 1 capsule (240 mg total) by mouth daily. 30 capsule 2   No current facility-administered medications for this visit.     Allergies:   Amoxil [amoxicillin]; Chocolate; Demerol; Lisinopril; Meperidine; Meperidine hcl; and Other    Social History:  The patient  reports that she has quit smoking. She has never used smokeless tobacco. She reports that she does not drink alcohol or use drugs.   Family History:  The patient's family history includes Heart failure in her mother; Hyperlipidemia in her maternal grandmother and mother; Lung cancer in her father; Peripheral vascular disease in her maternal grandmother.    ROS:  Please see the history of present illness. Otherwise, review of systems are positive for none.   All other systems are reviewed and negative.    PHYSICAL EXAM: VS:  BP  (!) 110/56   Pulse (!) 56   Wt 196 lb (88.9 kg)   BMI 33.64 kg/m  , BMI Body mass index is 33.64 kg/m.   General: Overweight, NAD Skin: Warm, dry, intact  Head: Normocephalic, atraumatic, clear, moist mucus membranes. Neck: Negative for carotid bruits. No JVD Lungs:Clear to ausculation bilaterally. No wheezes, rales, or rhonchi. Breathing is unlabored. Cardiovascular: RRR with S1 S2. No murmurs, rubs, gallops, or LV heave appreciated. Abdomen: Soft, non-tender, non-distended with normoactive bowel sounds. No obvious abdominal masses. MSK: Strength and tone appear abnormal for age. Weak in right greater than left  Extremities: Mild BLE 1+ edema. No clubbing or cyanosis. DP/PT pulses 1+ bilaterally Neuro: Alert and oriented. No focal deficits. No facial asymmetry. MAE spontaneously. Psych: Responds to questions appropriately with normal affect.     EKG:  EKG is not ordered today.   Recent Labs: 05/04/2017: ALT 15; TSH 0.92 01/25/2018: BUN 16; Creatinine, Ser 0.86; Hemoglobin 10.4; Magnesium 2.1; Platelets 167; Potassium 4.2; Sodium 134   Lipid Panel    Component Value Date/Time   CHOL 151 01/20/2016   TRIG 438 (A) 01/20/2016   HDL 27 (A) 01/20/2016   CHOLHDL 5.9 02/15/2015 0514   VLDL 73 (H) 02/15/2015 0514   LDLCALC 82 01/20/2016     Wt Readings from Last 3 Encounters:  04/15/18 196 lb (88.9 kg)  04/12/18 197 lb 3.2 oz (89.4 kg)  01/22/18 207 lb 14.3 oz (94.3 kg)    Other studies Reviewed: Additional studies/ records that were reviewed today include:   Cath 02/16/2015:   Widely patent coronary arteries without significant stenoses  Normal LV function  Suspect elevated troponin related to demand ischemia in setting of atrial tachycardia. There is no significant coronary obstruction.  Echocardiogram 01/03/2018: Study Conclusions  - Left ventricle: The cavity size was normal. Systolic function was   normal. The estimated ejection fraction was in the range of  60%   to 65%. Wall motion was normal; there were no regional wall  motion abnormalities. Left ventricular diastolic function   parameters were normal for the patient&'s age. - Aortic valve: Trileaflet; mildly thickened, mildly calcified   leaflets. Left coronary cusp predominanly affected by   calcification. Sclerosis without stenosis. There was no   regurgitation. - Mitral valve: There was no significant regurgitation. - Right ventricle: Systolic function was normal. - Atrial septum: No defect or patent foramen ovale was identified. - Tricuspid valve: There was no significant regurgitation. - Pulmonic valve: There was no significant regurgitation.  Impressions:  - Normal LV function. No cardiac source of embolism. Aortic valve   sclerosis without stenosis.  ASSESSMENT AND PLAN:  1. Hx of SVT: -Denies palpitations  -On metoprolol, verapamil, digoxin>>continue  -Last digoxin level stable without toxicity    2. Essential HTN: -Stable, 110/56 -Continue current regimen  -Metoprolol, ACE-I, verapamil  3. Chronic diastolic heart failure: -Last echocardiogram 96/2836 with no diastolic dysfunction  -No HF symptoms   4. DM2: -Followed by PCP>>on insulin  -Seen 04/14/2018>>to be seen next week  -HbA1c elevated   5. Hx of mechanical fall s/p acute C7 fx: -Acute injury 01/2018 s/p mechanical fall, complicated by spinal cord hematoma and cord compression -Now with chronic upper extremity weakness with mild improvement after decompression surgery  -Discharged 01/29/2018 to SNF -Now walking with walker, awaiting PT/OT approval for continued home care  -Daughter with patient     Current medicines are reviewed at length with the patient today.  The patient does not have concerns regarding medicines.  The following changes have been made:  no change  Labs/ tests ordered today include: None   Orders Placed This Encounter  Procedures  . EKG 12-Lead    Disposition:   FU with  Kathyrn Drown NP-C in 6 months or sooner if needed   Signed, Kathyrn Drown, NP  04/15/2018 2:55 [facility] [place], [place], [place]  [address] Phone: [phone]; Fax: [phone]

## 2018-04-16 ENCOUNTER — Other Ambulatory Visit: Payer: Self-pay | Admitting: *Deleted

## 2018-04-16 ENCOUNTER — Telehealth: Payer: Self-pay | Admitting: *Deleted

## 2018-04-16 MED ORDER — TAMSULOSIN HCL 0.4 MG PO CAPS: 0.4000 mg | ORAL_CAPSULE | Freq: Every day | ORAL | 0 refills | Status: DC

## 2018-04-16 NOTE — Telephone Encounter (Signed)
Rx sent to pharmacy for 30 day supply.

## 2018-04-16 NOTE — Patient Outreach (Signed)
[facility]) Care Management  04/16/2018  RAIGEN JAGIELSKI Sep 07, 1940 [id number]   CSW attempted to follow-up with patient's daughter, [name] with community resources. Patient had discharged home from [facility] on 04/06/18. CSW was unable to reach patient's daughter, [name] but left a HIPPA compliant voicemail and will await returned call. CSW will try again in 4 days if no response.    Raynaldo Opitz, LCSW [facility]  Clinical Social Worker cell #: [phone]

## 2018-04-17 DIAGNOSIS — F329 Major depressive disorder, single episode, unspecified: Secondary | ICD-10-CM | POA: Diagnosis not present

## 2018-04-17 DIAGNOSIS — Z981 Arthrodesis status: Secondary | ICD-10-CM | POA: Diagnosis not present

## 2018-04-17 DIAGNOSIS — H9313 Tinnitus, bilateral: Secondary | ICD-10-CM | POA: Diagnosis not present

## 2018-04-17 DIAGNOSIS — G2581 Restless legs syndrome: Secondary | ICD-10-CM | POA: Diagnosis not present

## 2018-04-17 DIAGNOSIS — I110 Hypertensive heart disease with heart failure: Secondary | ICD-10-CM | POA: Diagnosis not present

## 2018-04-17 DIAGNOSIS — H35369 Drusen (degenerative) of macula, unspecified eye: Secondary | ICD-10-CM | POA: Diagnosis not present

## 2018-04-17 DIAGNOSIS — E538 Deficiency of other specified B group vitamins: Secondary | ICD-10-CM | POA: Diagnosis not present

## 2018-04-17 DIAGNOSIS — G8191 Hemiplegia, unspecified affecting right dominant side: Secondary | ICD-10-CM | POA: Diagnosis not present

## 2018-04-17 DIAGNOSIS — M810 Age-related osteoporosis without current pathological fracture: Secondary | ICD-10-CM | POA: Diagnosis not present

## 2018-04-17 DIAGNOSIS — E785 Hyperlipidemia, unspecified: Secondary | ICD-10-CM | POA: Diagnosis not present

## 2018-04-17 DIAGNOSIS — M19049 Primary osteoarthritis, unspecified hand: Secondary | ICD-10-CM | POA: Diagnosis not present

## 2018-04-17 DIAGNOSIS — E1165 Type 2 diabetes mellitus with hyperglycemia: Secondary | ICD-10-CM | POA: Diagnosis not present

## 2018-04-17 DIAGNOSIS — R339 Retention of urine, unspecified: Secondary | ICD-10-CM | POA: Diagnosis not present

## 2018-04-17 DIAGNOSIS — Z87440 Personal history of urinary (tract) infections: Secondary | ICD-10-CM | POA: Diagnosis not present

## 2018-04-17 DIAGNOSIS — I471 Supraventricular tachycardia: Secondary | ICD-10-CM | POA: Diagnosis not present

## 2018-04-17 DIAGNOSIS — K219 Gastro-esophageal reflux disease without esophagitis: Secondary | ICD-10-CM | POA: Diagnosis not present

## 2018-04-17 DIAGNOSIS — Z794 Long term (current) use of insulin: Secondary | ICD-10-CM | POA: Diagnosis not present

## 2018-04-17 DIAGNOSIS — I5032 Chronic diastolic (congestive) heart failure: Secondary | ICD-10-CM | POA: Diagnosis not present

## 2018-04-17 DIAGNOSIS — G8929 Other chronic pain: Secondary | ICD-10-CM | POA: Diagnosis not present

## 2018-04-17 DIAGNOSIS — S12601D Unspecified nondisplaced fracture of seventh cervical vertebra, subsequent encounter for fracture with routine healing: Secondary | ICD-10-CM | POA: Diagnosis not present

## 2018-04-17 DIAGNOSIS — G8194 Hemiplegia, unspecified affecting left nondominant side: Secondary | ICD-10-CM | POA: Diagnosis not present

## 2018-04-17 DIAGNOSIS — Z7982 Long term (current) use of aspirin: Secondary | ICD-10-CM | POA: Diagnosis not present

## 2018-04-17 DIAGNOSIS — R41841 Cognitive communication deficit: Secondary | ICD-10-CM | POA: Diagnosis not present

## 2018-04-17 DIAGNOSIS — M797 Fibromyalgia: Secondary | ICD-10-CM | POA: Diagnosis not present

## 2018-04-19 ENCOUNTER — Other Ambulatory Visit: Payer: Self-pay | Admitting: *Deleted

## 2018-04-22 ENCOUNTER — Ambulatory Visit (INDEPENDENT_AMBULATORY_CARE_PROVIDER_SITE_OTHER): Payer: PPO | Admitting: Family Medicine

## 2018-04-22 ENCOUNTER — Encounter: Payer: Self-pay | Admitting: Family Medicine

## 2018-04-22 VITALS — BP 130/80 | HR 67 | Temp 98.4°F | Resp 12 | Ht 64.0 in | Wt 193.2 lb

## 2018-04-22 DIAGNOSIS — S12601D Unspecified nondisplaced fracture of seventh cervical vertebra, subsequent encounter for fracture with routine healing: Secondary | ICD-10-CM | POA: Diagnosis not present

## 2018-04-22 DIAGNOSIS — H35369 Drusen (degenerative) of macula, unspecified eye: Secondary | ICD-10-CM | POA: Diagnosis not present

## 2018-04-22 DIAGNOSIS — R41841 Cognitive communication deficit: Secondary | ICD-10-CM | POA: Diagnosis not present

## 2018-04-22 DIAGNOSIS — M810 Age-related osteoporosis without current pathological fracture: Secondary | ICD-10-CM | POA: Diagnosis not present

## 2018-04-22 DIAGNOSIS — G8191 Hemiplegia, unspecified affecting right dominant side: Secondary | ICD-10-CM | POA: Diagnosis not present

## 2018-04-22 DIAGNOSIS — M797 Fibromyalgia: Secondary | ICD-10-CM | POA: Diagnosis not present

## 2018-04-22 DIAGNOSIS — M4712 Other spondylosis with myelopathy, cervical region: Secondary | ICD-10-CM

## 2018-04-22 DIAGNOSIS — F320 Major depressive disorder, single episode, mild: Secondary | ICD-10-CM | POA: Diagnosis not present

## 2018-04-22 DIAGNOSIS — E785 Hyperlipidemia, unspecified: Secondary | ICD-10-CM | POA: Diagnosis not present

## 2018-04-22 DIAGNOSIS — I10 Essential (primary) hypertension: Secondary | ICD-10-CM | POA: Diagnosis not present

## 2018-04-22 DIAGNOSIS — I5032 Chronic diastolic (congestive) heart failure: Secondary | ICD-10-CM | POA: Diagnosis not present

## 2018-04-22 DIAGNOSIS — Z794 Long term (current) use of insulin: Secondary | ICD-10-CM

## 2018-04-22 DIAGNOSIS — E1140 Type 2 diabetes mellitus with diabetic neuropathy, unspecified: Secondary | ICD-10-CM | POA: Diagnosis not present

## 2018-04-22 DIAGNOSIS — R32 Unspecified urinary incontinence: Secondary | ICD-10-CM

## 2018-04-22 DIAGNOSIS — Z7982 Long term (current) use of aspirin: Secondary | ICD-10-CM | POA: Diagnosis not present

## 2018-04-22 DIAGNOSIS — I471 Supraventricular tachycardia: Secondary | ICD-10-CM | POA: Diagnosis not present

## 2018-04-22 DIAGNOSIS — K219 Gastro-esophageal reflux disease without esophagitis: Secondary | ICD-10-CM | POA: Diagnosis not present

## 2018-04-22 DIAGNOSIS — F329 Major depressive disorder, single episode, unspecified: Secondary | ICD-10-CM | POA: Diagnosis not present

## 2018-04-22 DIAGNOSIS — G8194 Hemiplegia, unspecified affecting left nondominant side: Secondary | ICD-10-CM | POA: Diagnosis not present

## 2018-04-22 DIAGNOSIS — E1165 Type 2 diabetes mellitus with hyperglycemia: Secondary | ICD-10-CM | POA: Diagnosis not present

## 2018-04-22 DIAGNOSIS — M19049 Primary osteoarthritis, unspecified hand: Secondary | ICD-10-CM | POA: Diagnosis not present

## 2018-04-22 DIAGNOSIS — Z87440 Personal history of urinary (tract) infections: Secondary | ICD-10-CM | POA: Diagnosis not present

## 2018-04-22 DIAGNOSIS — G2581 Restless legs syndrome: Secondary | ICD-10-CM | POA: Diagnosis not present

## 2018-04-22 DIAGNOSIS — H9313 Tinnitus, bilateral: Secondary | ICD-10-CM | POA: Diagnosis not present

## 2018-04-22 DIAGNOSIS — E538 Deficiency of other specified B group vitamins: Secondary | ICD-10-CM | POA: Diagnosis not present

## 2018-04-22 DIAGNOSIS — I110 Hypertensive heart disease with heart failure: Secondary | ICD-10-CM | POA: Diagnosis not present

## 2018-04-22 DIAGNOSIS — R339 Retention of urine, unspecified: Secondary | ICD-10-CM | POA: Diagnosis not present

## 2018-04-22 DIAGNOSIS — Z981 Arthrodesis status: Secondary | ICD-10-CM | POA: Diagnosis not present

## 2018-04-22 DIAGNOSIS — G8929 Other chronic pain: Secondary | ICD-10-CM | POA: Diagnosis not present

## 2018-04-22 NOTE — Assessment & Plan Note (Signed)
HgA1C 6.2. Cautions with hypoglycemia. No changes today. Continue following with endocrinologist.

## 2018-04-22 NOTE — Assessment & Plan Note (Signed)
She is not fasting today. No changes in Zetia. She has not tolerated statin meds in the past. Will plan on checking FLP next visit.

## 2018-04-22 NOTE — Patient Instructions (Addendum)
A few things to remember from today's visit:   Essential hypertension, benign  Cervical spondylosis with myelopathy  Current mild episode of major depressive disorder, unspecified whether recurrent (HCC)  No changes today. Caution with low blood sugar. I would like to see you back in 5-6 months.   Please be sure medication list is accurate. If a new problem present, please set up appointment sooner than planned today.

## 2018-04-22 NOTE — Progress Notes (Signed)
HPI:  Chief Complaint  Patient presents with  . Hospitalization Follow-up    recent discharge from SNF    [name] is a 78 y.o. female, who is here today with her daughter to follow on recent hospitalization and SNF discharge.  On 01/21/2018 she had a fall, struck head when she fell on her porch.  Cervical spine CT showed acute fractures of the left side of the C7 vertebral body that extended into the left face head and base of the left transverse process, cervical collar was placed and on neurosurgeon on-call recommended outpatient follow-up.  According to daughter after 2 doses of Dilaudid she was discharged home, because she could not walk and was very sleepy her daughter asked for pt to keep it on observation until she was less sleepy and able to walk.  She was hospitalized on 01/21/2018 with diagnosis of UTI and for pain management.  On 01/22/2018 she was evaluated by Dr. Ellene [name] because she could not move her legs.   Cervical MRI was done, which showed C 6-7 spinal cord edema with focal dorsal intramedullary versus extramedullary/epidural hemorrhage posteriorly.  Extensive edema and hemorrhage throughout the posterior neck soft tissue, C7 vertebral and posterior element fracture, noncompressive dorsal epidural hematoma throughout the cervical and visualized upper thoracic spine. On 01/25/2018 she underwent posterior decompression with laminectomy C6-C7 posterior fixation.  She was discharged to a SNF on 01/29/2018 and discharged home on 04/04/2018. Home health has been arranged, she is having PT and OT a couple times per week. According to her daughter, her house has many safety features to prevent falls (grab bars).  She still has upper extremity weakness.  Progressively getting better with PT. Lower extremity weakness has almost resolved, legs feel "wobbly." She needs assistance for transfer, she is using a walker.  She also mentions that since urine cath were removed  she has been having urine incontinence, [name] states that the problem is slowly improving. She denies dysuria or gross hematuria.   Anemia: She received 2 units of red blood cells during surgery.  According to patient, she had CBC x2 done while she was in SNF and reported as stable.  Lab Results  Component Value Date   WBC 9.1 01/25/2018   HGB 10.4 (L) 01/25/2018   HCT 32.0 (L) 01/25/2018   MCV 87.4 01/25/2018   PLT 167 01/25/2018    Today she reported that since she started metformin she has not had diarrhea. Currently she is on Lantus 25 units daily.  Lab Results  Component Value Date   HGBA1C 6.2 (A) 04/12/2018  Denies episodes of hypoglycemia.  She follows with endocrinologist for DM2.  Hyperlipidemia, She would like to have lipid panel done today but she is not fasting. Currently she is on Zetia 10 mg daily. She has not tolerated statin medications in the past.  Lab Results  Component Value Date   CHOL 151 01/20/2016   HDL 27 (A) 01/20/2016   LDLCALC 82 01/20/2016   TRIG 438 (A) 01/20/2016   CHOLHDL 5.9 02/15/2015   Since her last visit she has followed with ENT due to vertigo.  According to patient, meclizine were recommended and instructed to arrange an appointment with neurologist.  She was evaluated by neurologist, Dr. Leta [name] on 12/21/2017. According to the daughter, work-up was negative. Diagnosed with migraine with aura. 6 months follow-up was recommended.  She has not had any episodes of vertigo since fall.  Depression and anxiety: She has  not been on Zoloft for about 4 to 5 months. She states that Zoloft was causing sleepiness, so she discontinued. Her daughter states that she is doing a lot of better since she has had company at home, she has an aide that stays with her most of the day. She does not think she needs to take medication for now. She denies depressed mood or suicidal thoughts. Despite of all her recent health issues, she is glad  she is doing better.  Review of Systems  Constitutional: Positive for fatigue. Negative for appetite change and fever.  HENT: Negative for mouth sores, nosebleeds and trouble swallowing.   Respiratory: Negative for cough, shortness of breath and wheezing.   Cardiovascular: Negative for chest pain, palpitations and leg swelling.  Gastrointestinal: Negative for abdominal pain, nausea and vomiting.       Negative for changes in bowel habits.  Endocrine: Negative for polydipsia, polyphagia and polyuria.  Genitourinary: Negative for decreased urine volume, dysuria and hematuria.  Musculoskeletal: Positive for back pain and gait problem.  Skin: Negative for rash and wound.  Neurological: Positive for weakness. Negative for syncope and headaches.  Psychiatric/Behavioral: Negative for confusion. The patient is nervous/anxious.     Current Outpatient Medications on File Prior to Visit  Medication Sig Dispense Refill  . acetaminophen (TYLENOL) 325 MG tablet Take 325 mg by mouth. 1 tablet at 8 am and 1 tablet prn    . aspirin EC 81 MG tablet Take 81 mg by mouth every evening.    . Calcium Carb-Cholecalciferol (CALCIUM 600+D3) 600-200 MG-UNIT TABS Take 1 tablet by mouth every evening. Calcium 600 mg -Vitamin D 200 mg    . cholecalciferol (VITAMIN D) 1000 units tablet Take 1,000 Units by mouth daily. Daily in the am    . Cranberry 1000 MG CAPS Take 1,000 mg by mouth 2 (two) times daily.     . digoxin (LANOXIN) 0.125 MG tablet Take 1 tablet (0.125 mg total) by mouth daily. Patient needs appointment for further refills (Patient taking differently: Take 0.125 mg by mouth daily. ) 30 tablet 0  . ezetimibe (ZETIA) 10 MG tablet Take 10 mg by mouth daily.    . Insulin Aspart (NOVOLOG [place]) Inject 8 Units into the skin 3 (three) times daily before meals.    . insulin glargine (LANTUS) 100 UNIT/ML injection Inject 25 Units into the skin daily. Daily in the am    . Insulin Pen Needle (BD PEN NEEDLE MICRO U/F) 32G  X 6 MM MISC Twice a day (basaglar and victoza) 100 each 6  . methocarbamol (ROBAXIN) 500 MG tablet Take 1 tablet (500 mg total) by mouth every 6 (six) hours as needed for muscle spasms. 40 tablet 3  . metoprolol succinate (TOPROL-XL) 25 MG 24 hr tablet Take 1 tablet (25 mg total) by mouth daily. Patient needs to schedule an appointment for an additional refill (Patient taking differently: Take 25 mg by mouth daily. ) 90 tablet 0  . Multiple Vitamins-Minerals (PRESERVISION AREDS 2 PO) Take 1 capsule by mouth 2 (two) times daily.    . nitroGLYCERIN (NITROSTAT) 0.4 MG SL tablet Place 0.4 mg under the tongue every 5 (five) minutes as needed for chest pain.     Marland Kitchen omeprazole (PRILOSEC) 20 MG capsule Take 1 capsule (20 mg total) by mouth daily. (Patient taking differently: Take 20 mg by mouth daily. Daily in the morning) 90 capsule 2  . ondansetron (ZOFRAN) 4 MG tablet Take 1 tablet (4 mg total) by mouth  every 8 (eight) hours as needed for nausea or vomiting. 20 tablet 0  . ramipril (ALTACE) 10 MG capsule TAKE 1 CAPSULE (10 MG TOTAL) BY MOUTH EVERY EVENING. 90 capsule 0  . RELION INSULIN SYRINGE 1ML/31G 31G X 5/16" 1 ML MISC Use as directed 100 each 1  . rOPINIRole (REQUIP) 1 MG tablet TAKE ONE TABLET BY MOUTH AT BEDTIME. (Patient taking differently: Take 1 mg by mouth at bedtime. ) 90 tablet 1  . tamsulosin (FLOMAX) 0.4 MG CAPS capsule Take 1 capsule (0.4 mg total) by mouth daily. 30 capsule 0  . verapamil (CALAN) 120 MG tablet Take 120 mg by mouth daily as needed (afib).     . verapamil (VERELAN PM) 240 MG 24 hr capsule Take 1 capsule (240 mg total) by mouth daily. 30 capsule 2   No current facility-administered medications on file prior to visit.      Past Medical History:  Diagnosis Date  . Anemia    during 1st pregnancy  . Anxiety   . Arthritis   . Complication of anesthesia   . Depression   . Diarrhea, functional   . Dysrhythmia    Paroysmal Atrial Tachycardia  . Essential hypertension     . Fibromyalgia   . GERD (gastroesophageal reflux disease)   . H/O acute pancreatitis   . Headache   . History of hiatal hernia   . Hyperlipemia   . Osteoporosis   . PAT (paroxysmal atrial tachycardia) ([place])   . Pneumonia   . PONV (postoperative nausea and vomiting)   . PSVT (paroxysmal supraventricular tachycardia) ([place])   . Reflux   . Restless leg syndrome   . Type 2 diabetes mellitus (HCC)    Type II  . Vertigo    Allergies  Allergen Reactions  . Amoxil [Amoxicillin] Swelling and Other (See Comments)    Lips Has patient had a PCN reaction causing immediate rash, facial/tongue/throat swelling, SOB or lightheadedness with hypotension:YES Has patient had a PCN reaction causing severe rash involving mucus membranes or skin necrosis:No Has patient had a PCN reaction that required hospitalization:No Has patient had a PCN reaction occurring within the last 10 years:Yes. If all of the above answers are "NO", then may proceed with Cephalosporin use.   . Chocolate Hives  . Demerol Nausea And Vomiting  . Lisinopril Itching and Swelling  . Meperidine     Other reaction(s): Vomiting (intolerance)  . Meperidine Hcl     Other reaction(s): Vomiting (intolerance)  . Other     Raw food or nuts - GI pain    Social History   Socioeconomic History  . Marital status: Widowed    Spouse name: Glendell Docker  . Number of children: 2  . Years of education: Not on file  . Highest education level: Not on file  Occupational History  . Not on file  Social Needs  . Financial resource strain: Not on file  . Food insecurity:    Worry: Not on file    Inability: Not on file  . Transportation needs:    Medical: Not on file    Non-medical: Not on file  Tobacco Use  . Smoking status: Former Research scientist (life sciences)  . Smokeless tobacco: Never Used  Substance and Sexual Activity  . Alcohol use: No  . Drug use: No  . Sexual activity: Not on file  Lifestyle  . Physical activity:    Days per week: Not on file     Minutes per session: Not on file  .  Stress: Not on file  Relationships  . Social connections:    Talks on phone: Not on file    Gets together: Not on file    Attends religious service: Not on file    Active member of club or organization: Not on file    Attends meetings of clubs or organizations: Not on file    Relationship status: Not on file  Other Topics Concern  . Not on file  Social History Narrative   Lives home alone.  Daughter [name] with her today.   Independent of ADLs.  Education HS.  Widowed.      Vitals:   04/22/18 1400  BP: 130/80  Pulse: 67  Resp: 12  Temp: 98.4 F (36.9 C)  SpO2: 95%   Body mass index is 33.17 kg/m.   Physical Exam  Nursing note and vitals reviewed. Constitutional: She is oriented to person, place, and time. She appears well-developed. No distress.  HENT:  Head: Normocephalic and atraumatic.  Mouth/Throat: Oropharynx is clear and moist and mucous membranes are normal.  Eyes: Pupils are equal, round, and reactive to light. Conjunctivae are normal.  Cardiovascular: Normal rate and regular rhythm.  No murmur heard. Pulses:      Dorsalis pedis pulses are 2+ on the right side and 2+ on the left side.  Respiratory: Effort normal and breath sounds normal. No respiratory distress.  GI: Soft. She exhibits no mass. There is no abdominal tenderness.  Musculoskeletal:        General: No edema.  Lymphadenopathy:    She has no cervical adenopathy.  Neurological: She is alert and oriented to person, place, and time. Gait abnormal.  Proximal UE weakness,bilateral. Unstable gait assisted by a walker.   Skin: Skin is warm. No rash noted. No erythema.  Psychiatric: She has a normal mood and affect.  Well groomed, good eye contact.    ASSESSMENT AND PLAN:  Ms. Ileene was seen today for hospitalization follow-up.  Diagnoses and all orders for this visit:  Urinary incontinence, unspecified type We discussed possible etiologies. Given her  cervical myelopathy, neurogenic bladder is to be considered. Explained that is it not uncommon to have urine continence problem after urine cath removal. [place] exercises and pelvic floor PT exercises recommended. According to patient, problem is slowly improving, so we will continue monitoring.  Cervical spondylosis with myelopathy Weakness is progressively improving. Continue PT and OT. Keep next f/u appointment with Dr. Ellene [name]. Fall precautions discussed.  DM (diabetes mellitus) type II controlled, neurological manifestation (HCC) HgA1C 6.2. Cautions with hypoglycemia. No changes today. Continue following with endocrinologist.  Essential hypertension, benign Adequately controlled. No changes in current management. Continue low salt diet. Eye exam is current. F/U in 6 months, before if needed.   Hyperlipidemia She is not fasting today. No changes in Zetia. She has not tolerated statin meds in the past. Will plan on checking FLP next visit.  Depression She is not on pharmacologic treatment and does not think she needs any at this time.  Her daughter agrees. Instructed about warning signs. Follow-up in 6 months before if needed.   Return in about 6 months (around 10/23/2018) for f/u.    Miyoshi Ligas G. [name], MD  [facility]. [place] office.

## 2018-04-22 NOTE — Assessment & Plan Note (Addendum)
She is not on pharmacologic treatment and does not think she needs any at this time.  Her daughter agrees. Instructed about warning signs. Follow-up in 6 months before if needed.

## 2018-04-22 NOTE — Assessment & Plan Note (Signed)
Adequately controlled. No changes in current management. Continue low-salt diet. Eye exam is current. F/U in 6 months, before if needed.  

## 2018-04-23 NOTE — Patient Outreach (Signed)
[facility]) Care Management  04/23/2018  [name] 05/29/1940 [id number]   CSW received call back from patient's daughter, [name] who updated CSW that patient has been doing well since discharging from [facility] but has been waiting for home health to get started. Niece states that she has already spoken with concierge with [facility] and they are waiting for a form to be approved that was submitted from [facility] or [facility] but they hope for [facility] to be started in the next day or 2. CSW spoke with niece about community resources but [name] states that she has already been in touch with 3 agencies about in-home assistance and waiting for calls back. CSW encouraged her to call back should she need more resources but she states that she was given a list from the social worker at the SNF and [facility]. CSW signing off at this time as no further CSW needs identified.    Raynaldo Opitz, LCSW [facility]  Clinical Social Worker cell #: [phone]

## 2018-04-24 DIAGNOSIS — I110 Hypertensive heart disease with heart failure: Secondary | ICD-10-CM | POA: Diagnosis not present

## 2018-04-24 DIAGNOSIS — H35369 Drusen (degenerative) of macula, unspecified eye: Secondary | ICD-10-CM | POA: Diagnosis not present

## 2018-04-24 DIAGNOSIS — G8929 Other chronic pain: Secondary | ICD-10-CM | POA: Diagnosis not present

## 2018-04-24 DIAGNOSIS — H9313 Tinnitus, bilateral: Secondary | ICD-10-CM | POA: Diagnosis not present

## 2018-04-24 DIAGNOSIS — Z981 Arthrodesis status: Secondary | ICD-10-CM | POA: Diagnosis not present

## 2018-04-24 DIAGNOSIS — Z794 Long term (current) use of insulin: Secondary | ICD-10-CM | POA: Diagnosis not present

## 2018-04-24 DIAGNOSIS — K219 Gastro-esophageal reflux disease without esophagitis: Secondary | ICD-10-CM | POA: Diagnosis not present

## 2018-04-24 DIAGNOSIS — I471 Supraventricular tachycardia: Secondary | ICD-10-CM | POA: Diagnosis not present

## 2018-04-24 DIAGNOSIS — G8194 Hemiplegia, unspecified affecting left nondominant side: Secondary | ICD-10-CM | POA: Diagnosis not present

## 2018-04-24 DIAGNOSIS — I5032 Chronic diastolic (congestive) heart failure: Secondary | ICD-10-CM | POA: Diagnosis not present

## 2018-04-24 DIAGNOSIS — R339 Retention of urine, unspecified: Secondary | ICD-10-CM | POA: Diagnosis not present

## 2018-04-24 DIAGNOSIS — E538 Deficiency of other specified B group vitamins: Secondary | ICD-10-CM | POA: Diagnosis not present

## 2018-04-24 DIAGNOSIS — Z7982 Long term (current) use of aspirin: Secondary | ICD-10-CM | POA: Diagnosis not present

## 2018-04-24 DIAGNOSIS — M19049 Primary osteoarthritis, unspecified hand: Secondary | ICD-10-CM | POA: Diagnosis not present

## 2018-04-24 DIAGNOSIS — E785 Hyperlipidemia, unspecified: Secondary | ICD-10-CM | POA: Diagnosis not present

## 2018-04-24 DIAGNOSIS — S12601D Unspecified nondisplaced fracture of seventh cervical vertebra, subsequent encounter for fracture with routine healing: Secondary | ICD-10-CM | POA: Diagnosis not present

## 2018-04-24 DIAGNOSIS — Z87440 Personal history of urinary (tract) infections: Secondary | ICD-10-CM | POA: Diagnosis not present

## 2018-04-24 DIAGNOSIS — E1165 Type 2 diabetes mellitus with hyperglycemia: Secondary | ICD-10-CM | POA: Diagnosis not present

## 2018-04-24 DIAGNOSIS — F329 Major depressive disorder, single episode, unspecified: Secondary | ICD-10-CM | POA: Diagnosis not present

## 2018-04-24 DIAGNOSIS — M797 Fibromyalgia: Secondary | ICD-10-CM | POA: Diagnosis not present

## 2018-04-24 DIAGNOSIS — G8191 Hemiplegia, unspecified affecting right dominant side: Secondary | ICD-10-CM | POA: Diagnosis not present

## 2018-04-24 DIAGNOSIS — M810 Age-related osteoporosis without current pathological fracture: Secondary | ICD-10-CM | POA: Diagnosis not present

## 2018-04-24 DIAGNOSIS — G2581 Restless legs syndrome: Secondary | ICD-10-CM | POA: Diagnosis not present

## 2018-04-24 DIAGNOSIS — R41841 Cognitive communication deficit: Secondary | ICD-10-CM | POA: Diagnosis not present

## 2018-04-26 ENCOUNTER — Ambulatory Visit (INDEPENDENT_AMBULATORY_CARE_PROVIDER_SITE_OTHER): Payer: PPO | Admitting: Internal Medicine

## 2018-04-26 ENCOUNTER — Encounter: Payer: Self-pay | Admitting: Internal Medicine

## 2018-04-26 ENCOUNTER — Other Ambulatory Visit: Payer: Self-pay

## 2018-04-26 VITALS — BP 124/78 | HR 70 | Ht 64.0 in | Wt 191.0 lb

## 2018-04-26 DIAGNOSIS — Z794 Long term (current) use of insulin: Secondary | ICD-10-CM

## 2018-04-26 DIAGNOSIS — E1142 Type 2 diabetes mellitus with diabetic polyneuropathy: Secondary | ICD-10-CM

## 2018-04-26 DIAGNOSIS — I10 Essential (primary) hypertension: Secondary | ICD-10-CM

## 2018-04-26 MED ORDER — METOPROLOL SUCCINATE ER 25 MG PO TB24: 25.0000 mg | ORAL_TABLET | Freq: Every day | ORAL | 3 refills | Status: DC

## 2018-04-26 MED ORDER — VERAPAMIL HCL ER 240 MG PO CP24: 240.0000 mg | ORAL_CAPSULE | Freq: Every day | ORAL | 3 refills | Status: DC

## 2018-04-26 MED ORDER — DIGOXIN 125 MCG PO TABS: 0.1250 mg | ORAL_TABLET | Freq: Every day | ORAL | 3 refills | Status: DC

## 2018-04-26 NOTE — Progress Notes (Addendum)
Name: [name]  Age/ Sex: 78 y.o., female   MRN/ DOB: [id number], 18-Mar-1940     PCP: [name], [name], MD   Reason for Endocrinology Evaluation: Type 2 diabetes Mellitus  Initial Endocrine Consultative Visit:  12/28/2017    PATIENT IDENTIFIER: [name] is a 78 y.o. female with a past medical history of PAT, T2 DM, and urinary incontinence, Hx of pancreatitis. The patient has followed with Endocrinology clinic since 12/28/2017 for consultative assistance with management of her diabetes.  DIABETIC HISTORY:  [name] was diagnosed with T2DM > 10 years ago.  She is intolerant to metformin due to fecal incontinence on 2000 mg a day. Her hemoglobin A1c has ranged from 6.7% in 09/01/2017, peaking at 7.9% in 2011.   SUBJECTIVE:   During the last visit (04/12/2018): Her A1c was 6.2%, we continued lantus and started her on a standing dose of novolog 8 units with each meal.   Today (04/28/2018): [name] is here for a 2 week follow up for diabetes management. She is with her daughter [name].  She checks her blood sugars 4 times daily, preprandial to breakfast and bedtime. The patient has not had hypoglycemic episodes since the last clinic visit. Otherwise, the patient has not required any recent emergency interventions for hypoglycemia and has not had recent hospitalizations secondary to hyper or hypoglycemic episodes.    ROS: As per HPI and as detailed below: Review of Systems  Respiratory: Negative.   Cardiovascular: Negative.   Skin: Negative.  Rash:    Neurological: Positive for tingling and focal weakness.      HOME DIABETES REGIMEN:  Lantus 25 units BID  Novolog 8 units TIDQAC  Statin: No ACE-I/ARB: Yes    METER DOWNLOAD SUMMARY: 3/17-3/30/20 Fingerstick Blood Glucose Tests = 56 Average Number Tests/Day = 4 Overall Mean FS Glucose = 143.3 Standard Deviation = 30.2  BG Ranges:  Low = 97 High = 224   Hypoglycemic Events/30 Days: BG < 50 = 0 Episodes of symptomatic severe hypoglycemia = 0        DIABETIC COMPLICATIONS: Microvascular complications:   Neuropathy   Denies: retinopathy, nephropathy   Last eye exam: Completed 2019  Macrovascular complications:    Denies: CAD, PVD, CVA   HISTORY:  Past Medical History:  Past Medical History:  Diagnosis Date  . Anemia    during 1st pregnancy  . Anxiety   . Arthritis   . Complication of anesthesia   . Depression   . Diarrhea, functional   . Dysrhythmia    Paroysmal Atrial Tachycardia  . Essential hypertension   . Fibromyalgia   . GERD (gastroesophageal reflux disease)   . H/O acute pancreatitis   . Headache   . History of hiatal hernia   . Hyperlipemia   . Osteoporosis   . PAT (paroxysmal atrial tachycardia) ([place])   . Pneumonia   . PONV (postoperative nausea and vomiting)   . PSVT (paroxysmal supraventricular tachycardia) ([place])   . Reflux   . Restless leg syndrome   . Type 2 diabetes mellitus (HCC)    Type II  . Vertigo    Past Surgical History:  Past Surgical History:  Procedure Laterality Date  . ABDOMINAL HYSTERECTOMY    . ANKLE SURGERY    . ANTERIOR CERVICAL DECOMP/DISCECTOMY FUSION N/A 11/12/2017   Procedure: Cervical seven to Thoracic one  Anterior cervical decompression/discectomy/fusion with removal of old plate;  Surgeon: [name], MD;  Location: [facility];  Service: Neurosurgery;  Laterality: N/A;  . BACK SURGERY     T12-L1 fusion, Anterior Cervical fusion  . CARDIAC CATHETERIZATION N/A 02/16/2015   Procedure: Left Heart Cath and Coronary Angiography;  Surgeon: Sherren Mocha, MD;  Location: [facility];  Service: Cardiovascular;  Laterality: N/A;  . CHOLECYSTECTOMY    . COLONOSCOPY    . ESOPHAGOGASTRODUODENOSCOPY  12/21/2010   Procedure: ESOPHAGOGASTRODUODENOSCOPY (EGD);  Surgeon: Landry Dyke, MD;  Location: [facility];  Service: Endoscopy;   Laterality: N/A;  . EXCISION MORTON'S NEUROMA    . EYE SURGERY Bilateral    Cataract  . FRACTURE SURGERY     ankle x 2   . KNEE SURGERY     x 3  . NECK SURGERY    . POSTERIOR CERVICAL FUSION/FORAMINOTOMY N/A 01/22/2018   Procedure: Cervical six-seven Posterior Decompression with Posterior Fixation Cervical Five to Thoracic Two;  Surgeon: [name], MD;  Location: [facility];  Service: Neurosurgery;  Laterality: N/A;  Cervical 6-7 Posteror decompression with posterior fixation Cervical 5 to Thoracic 2  . ROTATOR CUFF REPAIR    . TONSILLECTOMY    . TOTAL KNEE ARTHROPLASTY Left 04/10/2017   Procedure: LEFT TOTAL KNEE ARTHROPLASTY;  Surgeon: Paralee Cancel, MD;  Location: [facility];  Service: Orthopedics;  Laterality: Left;  70 mins  . UPPER GASTROINTESTINAL ENDOSCOPY    . WRIST SURGERY      Social History:  reports that she has quit smoking. She has never used smokeless tobacco. She reports that she does not drink alcohol or use drugs. Family History:  Family History  Problem Relation Age of Onset  . Heart failure Mother   . Hyperlipidemia Mother   . Lung cancer Father        Lymphoma  . Hyperlipidemia Maternal Grandmother   . Peripheral vascular disease Maternal Grandmother      HOME MEDICATIONS: Allergies as of 04/26/2018      Reactions   Amoxil [amoxicillin] Swelling, Other (See Comments)   Lips Has patient had a PCN reaction causing immediate rash, facial/tongue/throat swelling, SOB or lightheadedness with hypotension:YES Has patient had a PCN reaction causing severe rash involving mucus membranes or skin necrosis:No Has patient had a PCN reaction that required hospitalization:No Has patient had a PCN reaction occurring within the last 10 years:Yes. If all of the above answers are "NO", then may proceed with Cephalosporin use.   Chocolate Hives   Demerol Nausea And Vomiting   Lisinopril Itching, Swelling   Meperidine    Other reaction(s): Vomiting (intolerance)   Meperidine  Hcl    Other reaction(s): Vomiting (intolerance)   Other    Raw food or nuts - GI pain      Medication List       Accurate as of April 26, 2018 11:59 PM. Always use your most recent med list.        acetaminophen 325 MG tablet Commonly known as:  TYLENOL Take 325 mg by mouth. 1 tablet at 8 am and 1 tablet prn   aspirin EC 81 MG tablet Take 81 mg by mouth every evening.   Calcium 600+D3 600-200 MG-UNIT Tabs Generic drug:  Calcium Carb-Cholecalciferol Take 1 tablet by mouth every evening. Calcium 600 mg -Vitamin D 200 mg   cholecalciferol 1000 units tablet Commonly known as:  VITAMIN D Take 1,000 Units by mouth daily. Daily in the am   Cranberry 1000 MG Caps Take 1,000 mg by mouth 2 (two) times daily.   digoxin 0.125 MG tablet Commonly known as:  Lanoxin Take 1 tablet (0.125 mg total) by mouth daily. Patient needs appointment for further refills   ezetimibe 10 MG tablet Commonly known as:  ZETIA Take 10 mg by mouth daily.   insulin glargine 100 UNIT/ML injection Commonly known as:  LANTUS Inject 25 Units into the skin daily. Daily in the am   Insulin Pen Needle 32G X 6 MM Misc Commonly known as:  BD Pen Needle Micro U/F Twice a day (basaglar and victoza)   methocarbamol 500 MG tablet Commonly known as:  Robaxin Take 1 tablet (500 mg total) by mouth every 6 (six) hours as needed for muscle spasms.   metoprolol succinate 25 MG 24 hr tablet Commonly known as:  TOPROL-XL Take 1 tablet (25 mg total) by mouth daily. Patient needs to schedule an appointment for an additional refill   nitroGLYCERIN 0.4 MG SL tablet Commonly known as:  NITROSTAT Place 0.4 mg under the tongue every 5 (five) minutes as needed for chest pain.   NOVOLOG Toyah Inject 8 Units into the skin 3 (three) times daily before meals.   omeprazole 20 MG capsule Commonly known as:  PRILOSEC Take 1 capsule (20 mg total) by mouth daily.   ondansetron 4 MG tablet Commonly known as:  Zofran Take 1  tablet (4 mg total) by mouth every 8 (eight) hours as needed for nausea or vomiting.   PRESERVISION AREDS 2 PO Take 1 capsule by mouth 2 (two) times daily.   ramipril 10 MG capsule Commonly known as:  ALTACE TAKE 1 CAPSULE (10 MG TOTAL) BY MOUTH EVERY EVENING.   RELION INSULIN SYRINGE 1ML/31G 31G X 5/16" 1 ML Misc Generic drug:  Insulin Syringe-Needle U-100 Use as directed   rOPINIRole 1 MG tablet Commonly known as:  REQUIP TAKE ONE TABLET BY MOUTH AT BEDTIME.   tamsulosin 0.4 MG Caps capsule Commonly known as:  FLOMAX Take 1 capsule (0.4 mg total) by mouth daily.   TURMERIC PO Take by mouth.   verapamil 120 MG tablet Commonly known as:  CALAN Take 120 mg by mouth daily as needed (afib).   verapamil 240 MG 24 hr capsule Commonly known as:  VERELAN PM Take 1 capsule (240 mg total) by mouth daily.        OBJECTIVE:   Vital Signs: BP 124/78 (BP Location: Left Arm, Patient Position: Sitting, Cuff Size: Normal)   Pulse 70   Ht 5\' 4"  (1.626 m)   Wt 191 lb (86.6 kg)   SpO2 98%   BMI 32.79 kg/m   Wt Readings from Last 3 Encounters:  04/26/18 191 lb (86.6 kg)  04/22/18 193 lb 4 oz (87.7 kg)  04/15/18 196 lb (88.9 kg)     Exam: General: Pt appears well and is in NAD  Neck: General: Supple without adenopathy. Thyroid: Thyroid size normal.  No goiter or nodules appreciated. No thyroid bruit.  Lungs: Clear with good BS bilat with no rales, rhonchi, or wheezes  Heart: RRR with normal S1 and S2 and no gallops; no murmurs; no rub  Abdomen: Normoactive bowel sounds, soft, nontender, without masses or organomegaly palpable  Extremities: No pretibial edema. No tremor. Normal strength and motion throughout. See detailed diabetic foot exam below.  Skin: Normal texture and temperature to palpation. No rash noted. No Acanthosis nigricans/skin tags. No lipohypertrophy.  Neuro: MS is good with appropriate affect, pt is alert and Ox3    DM foot exam:04/12/2018 The skin of the  feet is intact without sores or ulcerations. The pedal  pulses are 2+ on right and 2+ on left. The sensation is intact to a screening 5.07, 10 gram monofilament bilaterally            DATA REVIEWED:  Lab Results  Component Value Date   HGBA1C 6.2 (A) 04/12/2018   HGBA1C 7.6 (H) 11/12/2017   HGBA1C 6.7 (H) 09/04/2017   Lab Results  Component Value Date   MICROALBUR 26.1 (H) 01/12/2017   LDLCALC 82 01/20/2016   CREATININE 0.86 01/25/2018   Lab Results  Component Value Date   MICRALBCREAT 14.1 01/12/2017     ASSESSMENT / PLAN / RECOMMENDATIONS:   1) Type 2 Diabetes Mellitus, With  neuropathic complications - Most recent A1c of 6.2% %. Goal A1c < 7.0 %.   -In review of her meter download today, her BG's have been within acceptable range, without hypoglycemia. -She is intolerant to metformin, patient did state that she had pancreatitis back in 2014, I reviewed her hospitalization and her lipase has only been at 127, patient was diagnosed with gastroenteritis there is no mention of pancreatitis.  This was discussed with the patient. -She is again concerned about Trulicity, especially with the risk of nausea and diarrhea.  We decided to hold off on starting any add-on therapy at this point. -She does have some insulin to carbohydrate mismatch, but overall no changes are needed today.  MEDICATIONS:  Lantus 25 units once a day  NovoLog 8 units 3 times daily q. before meals  EDUCATION / INSTRUCTIONS:  BG monitoring instructions: Patient is instructed to check her blood sugars 4 times a day, before meals and bedtime.  Call [facility] if: BG persistently < 70 or > 300. . I reviewed the Rule of 15 for the treatment of hypoglycemia in detail with the patient. Literature supplied.    2) Diabetic complications:   Eye: Does not have known diabetic retinopathy.   Neuro/ Feet: Does have known diabetic peripheral neuropathy .   Renal: Patient does not have  known baseline CKD. She   is on an ACEI/ARB at present.     F/U in 12 weeks   signed electronically by: Mack Guise, MD  [facility]  [facility] [address] [place], [place] [address] Phone: [phone] FAX: [phone]   CC: [name], [name], [place] [address] Phone: [phone]  Fax: [phone]  Return to Endocrinology clinic as below: Future Appointments  Date Time Provider [place]  06/21/2018  2:00 PM Baley Lorimer, Melanie Crazier, MD [facility] None  06/24/2018  2:00 PM Penumalli, Earlean Polka, MD [facility] None  09/13/2018  1:00 PM [facility]

## 2018-04-26 NOTE — Patient Instructions (Signed)
-   Lantus 25 units ONCE a day  - Novolog 8 units , three times a day with meals  - If Your are NOT going to eat Carbohydrates, DO NOT take Novolog with a meal    - Check sugar before meals and bedtime    Choose healthy, lower carb lower calorie snacks: toss salad, cooked vegetables, cottage cheese, peanut butter, low fat cheese / string cheese, lower sodium deli meat, tuna salad or chicken salad    HOW TO TREAT LOW BLOOD SUGARS (Blood sugar LESS THAN 70 MG/DL)  Please follow the RULE OF 15 for the treatment of hypoglycemia treatment (when your (blood sugars are less than 70 mg/dL)    STEP 1: Take 15 grams of carbohydrates when your blood sugar is low, which includes:   3-4 GLUCOSE TABS  OR  3-4 OZ OF JUICE OR REGULAR SODA OR  ONE TUBE OF GLUCOSE GEL     STEP 2: RECHECK blood sugar in 15 MINUTES STEP 3: If your blood sugar is still low at the 15 minute recheck --> then, go back to STEP 1 and treat AGAIN with another 15 grams of carbohydrates.

## 2018-04-30 DIAGNOSIS — Z6832 Body mass index (BMI) 32.0-32.9, adult: Secondary | ICD-10-CM | POA: Diagnosis not present

## 2018-04-30 DIAGNOSIS — M542 Cervicalgia: Secondary | ICD-10-CM | POA: Diagnosis not present

## 2018-04-30 DIAGNOSIS — M7061 Trochanteric bursitis, right hip: Secondary | ICD-10-CM | POA: Diagnosis not present

## 2018-04-30 DIAGNOSIS — I10 Essential (primary) hypertension: Secondary | ICD-10-CM | POA: Diagnosis not present

## 2018-05-01 DIAGNOSIS — F329 Major depressive disorder, single episode, unspecified: Secondary | ICD-10-CM | POA: Diagnosis not present

## 2018-05-01 DIAGNOSIS — G8929 Other chronic pain: Secondary | ICD-10-CM | POA: Diagnosis not present

## 2018-05-01 DIAGNOSIS — Z87440 Personal history of urinary (tract) infections: Secondary | ICD-10-CM | POA: Diagnosis not present

## 2018-05-01 DIAGNOSIS — M19049 Primary osteoarthritis, unspecified hand: Secondary | ICD-10-CM | POA: Diagnosis not present

## 2018-05-01 DIAGNOSIS — R41841 Cognitive communication deficit: Secondary | ICD-10-CM | POA: Diagnosis not present

## 2018-05-01 DIAGNOSIS — I110 Hypertensive heart disease with heart failure: Secondary | ICD-10-CM | POA: Diagnosis not present

## 2018-05-01 DIAGNOSIS — M797 Fibromyalgia: Secondary | ICD-10-CM | POA: Diagnosis not present

## 2018-05-01 DIAGNOSIS — E1165 Type 2 diabetes mellitus with hyperglycemia: Secondary | ICD-10-CM | POA: Diagnosis not present

## 2018-05-01 DIAGNOSIS — M810 Age-related osteoporosis without current pathological fracture: Secondary | ICD-10-CM | POA: Diagnosis not present

## 2018-05-01 DIAGNOSIS — H9313 Tinnitus, bilateral: Secondary | ICD-10-CM | POA: Diagnosis not present

## 2018-05-01 DIAGNOSIS — K219 Gastro-esophageal reflux disease without esophagitis: Secondary | ICD-10-CM | POA: Diagnosis not present

## 2018-05-01 DIAGNOSIS — Z794 Long term (current) use of insulin: Secondary | ICD-10-CM | POA: Diagnosis not present

## 2018-05-01 DIAGNOSIS — S12601D Unspecified nondisplaced fracture of seventh cervical vertebra, subsequent encounter for fracture with routine healing: Secondary | ICD-10-CM | POA: Diagnosis not present

## 2018-05-01 DIAGNOSIS — Z981 Arthrodesis status: Secondary | ICD-10-CM | POA: Diagnosis not present

## 2018-05-01 DIAGNOSIS — I471 Supraventricular tachycardia: Secondary | ICD-10-CM | POA: Diagnosis not present

## 2018-05-01 DIAGNOSIS — H35369 Drusen (degenerative) of macula, unspecified eye: Secondary | ICD-10-CM | POA: Diagnosis not present

## 2018-05-01 DIAGNOSIS — G2581 Restless legs syndrome: Secondary | ICD-10-CM | POA: Diagnosis not present

## 2018-05-01 DIAGNOSIS — G8191 Hemiplegia, unspecified affecting right dominant side: Secondary | ICD-10-CM | POA: Diagnosis not present

## 2018-05-01 DIAGNOSIS — Z7982 Long term (current) use of aspirin: Secondary | ICD-10-CM | POA: Diagnosis not present

## 2018-05-01 DIAGNOSIS — E538 Deficiency of other specified B group vitamins: Secondary | ICD-10-CM | POA: Diagnosis not present

## 2018-05-01 DIAGNOSIS — I5032 Chronic diastolic (congestive) heart failure: Secondary | ICD-10-CM | POA: Diagnosis not present

## 2018-05-01 DIAGNOSIS — E785 Hyperlipidemia, unspecified: Secondary | ICD-10-CM | POA: Diagnosis not present

## 2018-05-01 DIAGNOSIS — G8194 Hemiplegia, unspecified affecting left nondominant side: Secondary | ICD-10-CM | POA: Diagnosis not present

## 2018-05-01 DIAGNOSIS — R339 Retention of urine, unspecified: Secondary | ICD-10-CM | POA: Diagnosis not present

## 2018-05-06 ENCOUNTER — Other Ambulatory Visit: Payer: Self-pay

## 2018-05-06 ENCOUNTER — Telehealth: Payer: Self-pay | Admitting: Internal Medicine

## 2018-05-06 MED ORDER — GLUCOSE BLOOD VI STRP: ORAL_STRIP | 12 refills | Status: DC

## 2018-05-06 NOTE — Telephone Encounter (Signed)
onetouch test strips for testing 3 times a day has been sent to requested pharmacy

## 2018-05-06 NOTE — Telephone Encounter (Signed)
Patient is needing a prescription sent in for her one touch testing strips . She stated a prescription has not been sent for this./     [id number] - [place], [place] - [address] AT [place]

## 2018-05-07 DIAGNOSIS — F329 Major depressive disorder, single episode, unspecified: Secondary | ICD-10-CM | POA: Diagnosis not present

## 2018-05-07 DIAGNOSIS — K219 Gastro-esophageal reflux disease without esophagitis: Secondary | ICD-10-CM | POA: Diagnosis not present

## 2018-05-07 DIAGNOSIS — M810 Age-related osteoporosis without current pathological fracture: Secondary | ICD-10-CM | POA: Diagnosis not present

## 2018-05-07 DIAGNOSIS — H35369 Drusen (degenerative) of macula, unspecified eye: Secondary | ICD-10-CM | POA: Diagnosis not present

## 2018-05-07 DIAGNOSIS — E1165 Type 2 diabetes mellitus with hyperglycemia: Secondary | ICD-10-CM | POA: Diagnosis not present

## 2018-05-07 DIAGNOSIS — M797 Fibromyalgia: Secondary | ICD-10-CM | POA: Diagnosis not present

## 2018-05-07 DIAGNOSIS — E538 Deficiency of other specified B group vitamins: Secondary | ICD-10-CM | POA: Diagnosis not present

## 2018-05-07 DIAGNOSIS — R41841 Cognitive communication deficit: Secondary | ICD-10-CM | POA: Diagnosis not present

## 2018-05-07 DIAGNOSIS — G8929 Other chronic pain: Secondary | ICD-10-CM | POA: Diagnosis not present

## 2018-05-07 DIAGNOSIS — I471 Supraventricular tachycardia: Secondary | ICD-10-CM | POA: Diagnosis not present

## 2018-05-07 DIAGNOSIS — G2581 Restless legs syndrome: Secondary | ICD-10-CM | POA: Diagnosis not present

## 2018-05-07 DIAGNOSIS — R339 Retention of urine, unspecified: Secondary | ICD-10-CM | POA: Diagnosis not present

## 2018-05-07 DIAGNOSIS — H9313 Tinnitus, bilateral: Secondary | ICD-10-CM | POA: Diagnosis not present

## 2018-05-07 DIAGNOSIS — S12601D Unspecified nondisplaced fracture of seventh cervical vertebra, subsequent encounter for fracture with routine healing: Secondary | ICD-10-CM | POA: Diagnosis not present

## 2018-05-07 DIAGNOSIS — G8194 Hemiplegia, unspecified affecting left nondominant side: Secondary | ICD-10-CM | POA: Diagnosis not present

## 2018-05-07 DIAGNOSIS — Z981 Arthrodesis status: Secondary | ICD-10-CM | POA: Diagnosis not present

## 2018-05-07 DIAGNOSIS — I5032 Chronic diastolic (congestive) heart failure: Secondary | ICD-10-CM | POA: Diagnosis not present

## 2018-05-07 DIAGNOSIS — I110 Hypertensive heart disease with heart failure: Secondary | ICD-10-CM | POA: Diagnosis not present

## 2018-05-07 DIAGNOSIS — Z87440 Personal history of urinary (tract) infections: Secondary | ICD-10-CM | POA: Diagnosis not present

## 2018-05-07 DIAGNOSIS — G8191 Hemiplegia, unspecified affecting right dominant side: Secondary | ICD-10-CM | POA: Diagnosis not present

## 2018-05-07 DIAGNOSIS — E785 Hyperlipidemia, unspecified: Secondary | ICD-10-CM | POA: Diagnosis not present

## 2018-05-07 DIAGNOSIS — M19049 Primary osteoarthritis, unspecified hand: Secondary | ICD-10-CM | POA: Diagnosis not present

## 2018-05-07 DIAGNOSIS — Z7982 Long term (current) use of aspirin: Secondary | ICD-10-CM | POA: Diagnosis not present

## 2018-05-07 DIAGNOSIS — Z794 Long term (current) use of insulin: Secondary | ICD-10-CM | POA: Diagnosis not present

## 2018-05-11 ENCOUNTER — Other Ambulatory Visit: Payer: Self-pay | Admitting: Family Medicine

## 2018-05-13 ENCOUNTER — Telehealth: Payer: Self-pay | Admitting: Internal Medicine

## 2018-05-13 NOTE — Telephone Encounter (Signed)
MEDICATION: Insulin Pen Needle (BD PEN NEEDLE MICRO U/F) 32G X 6 MM MISC  PHARMACY:  [id number] - [place], [place] - [address]  IS THIS A 90 DAY SUPPLY :   IS PATIENT OUT OF MEDICATION: yes  IF NOT; HOW MUCH IS LEFT:   LAST APPOINTMENT DATE: @3 /23/2020  NEXT APPOINTMENT DATE:@5 /09/2018  DO WE HAVE YOUR PERMISSION TO LEAVE A DETAILED MESSAGE:  OTHER COMMENTS:    **Let patient know to contact pharmacy at the end of the day to make sure medication is ready. **  ** Please notify patient to allow 48-72 hours to process**  **Encourage patient to contact the pharmacy for refills or they can request refills through [facility]**

## 2018-05-14 ENCOUNTER — Other Ambulatory Visit: Payer: Self-pay

## 2018-05-14 MED ORDER — INSULIN PEN NEEDLE 32G X 6 MM MISC: 6 refills | Status: DC

## 2018-05-14 MED ORDER — DEXCOM G6 SENSOR MISC: 1.0000 | 3 refills | Status: DC

## 2018-05-14 MED ORDER — DEXCOM G6 RECEIVER DEVI: 1.0000 | Freq: Every day | 0 refills | Status: DC

## 2018-05-14 MED ORDER — DEXCOM G6 TRANSMITTER MISC: 1.0000 | 3 refills | Status: DC

## 2018-05-14 NOTE — Addendum Note (Signed)
Addended by: Dorita Sciara on: 05/14/2018 07:29 AM   Modules accepted: Orders

## 2018-05-14 NOTE — Telephone Encounter (Signed)
Refill sent.

## 2018-05-17 ENCOUNTER — Telehealth: Payer: Self-pay | Admitting: Internal Medicine

## 2018-05-17 ENCOUNTER — Other Ambulatory Visit: Payer: Self-pay

## 2018-05-17 MED ORDER — INSULIN PEN NEEDLE 32G X 6 MM MISC: 6 refills | Status: DC

## 2018-05-17 NOTE — Telephone Encounter (Signed)
Patient's daughter stated that she would like a call back because her mom have several prescriptions that are not being sent in correctly  She stated "she has been through this several times now"  And is very upset that she can not seem to get this corrected.   PLEASE ADVISE

## 2018-05-17 NOTE — Telephone Encounter (Signed)
Insulin Pen Needle (BD PEN NEEDLE MICRO U/F) 32G X 6 MM MISC  The pharmacy stated that the patients is stating she test 4 times a day now not 2. Pharmacy needs a new RX. Please Advise, Thanks

## 2018-05-17 NOTE — Telephone Encounter (Signed)
Spoke to pt daughter and re-sent corrected rx for needles

## 2018-05-27 ENCOUNTER — Ambulatory Visit: Payer: Self-pay | Admitting: Family Medicine

## 2018-05-27 ENCOUNTER — Emergency Department (HOSPITAL_COMMUNITY): Payer: PPO

## 2018-05-27 ENCOUNTER — Other Ambulatory Visit: Payer: Self-pay

## 2018-05-27 ENCOUNTER — Encounter (HOSPITAL_COMMUNITY): Payer: Self-pay | Admitting: Emergency Medicine

## 2018-05-27 ENCOUNTER — Emergency Department (HOSPITAL_COMMUNITY)
Admission: EM | Admit: 2018-05-27 | Discharge: 2018-05-27 | Disposition: A | Discharge status: Home / Self Care | Payer: PPO | Attending: Emergency Medicine | Admitting: Emergency Medicine

## 2018-05-27 DIAGNOSIS — R05 Cough: Secondary | ICD-10-CM

## 2018-05-27 DIAGNOSIS — Z96652 Presence of left artificial knee joint: Secondary | ICD-10-CM | POA: Insufficient documentation

## 2018-05-27 DIAGNOSIS — Z794 Long term (current) use of insulin: Secondary | ICD-10-CM | POA: Insufficient documentation

## 2018-05-27 DIAGNOSIS — Z79899 Other long term (current) drug therapy: Secondary | ICD-10-CM | POA: Insufficient documentation

## 2018-05-27 DIAGNOSIS — Z87891 Personal history of nicotine dependence: Secondary | ICD-10-CM | POA: Insufficient documentation

## 2018-05-27 DIAGNOSIS — E119 Type 2 diabetes mellitus without complications: Secondary | ICD-10-CM | POA: Insufficient documentation

## 2018-05-27 DIAGNOSIS — Z7982 Long term (current) use of aspirin: Secondary | ICD-10-CM | POA: Insufficient documentation

## 2018-05-27 DIAGNOSIS — I10 Essential (primary) hypertension: Secondary | ICD-10-CM | POA: Insufficient documentation

## 2018-05-27 DIAGNOSIS — J069 Acute upper respiratory infection, unspecified: Secondary | ICD-10-CM | POA: Diagnosis not present

## 2018-05-27 DIAGNOSIS — R110 Nausea: Secondary | ICD-10-CM | POA: Diagnosis not present

## 2018-05-27 DIAGNOSIS — R509 Fever, unspecified: Secondary | ICD-10-CM | POA: Diagnosis present

## 2018-05-27 DIAGNOSIS — R059 Cough, unspecified: Secondary | ICD-10-CM

## 2018-05-27 NOTE — ED Triage Notes (Signed)
Pt presents related to worsening fatigue/weakness; cough, fever, and nausea since yesterday; concern for COVID.

## 2018-05-27 NOTE — ED Notes (Signed)
Bed: UT65 Expected date:  Expected time:  Means of arrival:  Comments: Negative pressure

## 2018-05-27 NOTE — ED Provider Notes (Signed)
[facility] DEPT Provider Note   CSN: [id number] Arrival date & time: 05/27/18  1021    History   Chief Complaint Chief Complaint  Patient presents with  . Fever  . Weakness    HPI [name] is a 78 y.o. female.     The history is provided by the patient and medical records.  Cough  Cough characteristics:  Non-productive Severity:  Moderate Onset quality:  Gradual Duration:  3 days Timing:  Constant Progression:  Waxing and waning Chronicity:  New Relieved by:  Nothing Worsened by:  Nothing Ineffective treatments:  None tried Associated symptoms: chills, headaches and myalgias   Associated symptoms: no chest pain, no diaphoresis, no fever, no rash, no rhinorrhea, no shortness of breath, no weight loss and no wheezing     Past Medical History:  Diagnosis Date  . Anemia    during 1st pregnancy  . Anxiety   . Arthritis   . Complication of anesthesia   . Depression   . Diarrhea, functional   . Dysrhythmia    Paroysmal Atrial Tachycardia  . Essential hypertension   . Fibromyalgia   . GERD (gastroesophageal reflux disease)   . H/O acute pancreatitis   . Headache   . History of hiatal hernia   . Hyperlipemia   . Osteoporosis   . PAT (paroxysmal atrial tachycardia) ([place])   . Pneumonia   . PONV (postoperative nausea and vomiting)   . PSVT (paroxysmal supraventricular tachycardia) ([place])   . Reflux   . Restless leg syndrome   . Type 2 diabetes mellitus (HCC)    Type II  . Vertigo     Patient Active Problem List   Diagnosis Date Noted  . Cervical spine fracture ([place]) 01/22/2018  . Weakness 01/21/2018  . Closed C7 fracture ([place]) 01/21/2018  . Fall   . Lower urinary tract infectious disease   . Cervical spondylosis with myelopathy 11/12/2017  . Tinnitus of both ears 10/19/2017  . Rhinorrhea 09/26/2017  . Chronic diarrhea 08/08/2017  . S/P left TKA 04/10/2017  . Unstable gait 01/12/2017  . Generalized osteoarthritis  of hand 01/12/2017  . RLS (restless legs syndrome) 11/09/2016  . GERD (gastroesophageal reflux disease) 07/13/2016  . Class 2 severe obesity due to excess calories with serious comorbidity and body mass index (BMI) of 36.0 to 36.9 in adult ([place]) 06/01/2016  . Vitamin D deficiency 06/01/2016  . Vitamin B12 deficiency 03/27/2016  . Chronic cough 03/16/2016  . Left foot pain 03/16/2016  . Demand ischemia ([place]) 02/14/2015  . Degenerative drusen 07/13/2013  . Type 2 diabetes mellitus without ophthalmic manifestations ([place]) 07/13/2013  . DM (diabetes mellitus) type II controlled, neurological manifestation ([place]) 05/07/2012  . Paroxysmal atrial tachycardia ([place]) 05/07/2012  . Essential hypertension, benign 05/07/2012  . Depression 05/07/2012  . Hyperlipidemia 05/07/2012  . Fibromyalgia 05/07/2012    Past Surgical History:  Procedure Laterality Date  . ABDOMINAL HYSTERECTOMY    . ANKLE SURGERY    . ANTERIOR CERVICAL DECOMP/DISCECTOMY FUSION N/A 11/12/2017   Procedure: Cervical seven to Thoracic one  Anterior cervical decompression/discectomy/fusion with removal of old plate;  Surgeon: [name], MD;  Location: [facility];  Service: Neurosurgery;  Laterality: N/A;  . BACK SURGERY     T12-L1 fusion, Anterior Cervical fusion  . CARDIAC CATHETERIZATION N/A 02/16/2015   Procedure: Left Heart Cath and Coronary Angiography;  Surgeon: Sherren Mocha, MD;  Location: [facility];  Service: Cardiovascular;  Laterality: N/A;  . CHOLECYSTECTOMY    .  COLONOSCOPY    . ESOPHAGOGASTRODUODENOSCOPY  12/21/2010   Procedure: ESOPHAGOGASTRODUODENOSCOPY (EGD);  Surgeon: Landry Dyke, MD;  Location: [facility];  Service: Endoscopy;  Laterality: N/A;  . EXCISION MORTON'S NEUROMA    . EYE SURGERY Bilateral    Cataract  . FRACTURE SURGERY     ankle x 2   . KNEE SURGERY     x 3  . NECK SURGERY    . POSTERIOR CERVICAL FUSION/FORAMINOTOMY N/A 01/22/2018   Procedure: Cervical six-seven Posterior  Decompression with Posterior Fixation Cervical Five to Thoracic Two;  Surgeon: [name], MD;  Location: [facility];  Service: Neurosurgery;  Laterality: N/A;  Cervical 6-7 Posteror decompression with posterior fixation Cervical 5 to Thoracic 2  . ROTATOR CUFF REPAIR    . TONSILLECTOMY    . TOTAL KNEE ARTHROPLASTY Left 04/10/2017   Procedure: LEFT TOTAL KNEE ARTHROPLASTY;  Surgeon: Paralee Cancel, MD;  Location: [facility];  Service: Orthopedics;  Laterality: Left;  70 mins  . UPPER GASTROINTESTINAL ENDOSCOPY    . WRIST SURGERY       OB History   No obstetric history on file.      Home Medications    Prior to Admission medications   Medication Sig Start Date End Date Taking? Authorizing Provider  acetaminophen (TYLENOL) 325 MG tablet Take 325 mg by mouth. 1 tablet at 8 am and 1 tablet prn    [provider]  aspirin EC 81 MG tablet Take 81 mg by mouth every evening.    [provider]  Calcium Carb-Cholecalciferol (CALCIUM 600+D3) 600-200 MG-UNIT TABS Take 1 tablet by mouth every evening. Calcium 600 mg -Vitamin D 200 mg    [provider]  cholecalciferol (VITAMIN D) 1000 units tablet Take 1,000 Units by mouth daily. Daily in the am    [provider]  Continuous Blood Gluc Receiver (DEXCOM G6 RECEIVER) DEVI 1 Device by Does not apply route daily. 05/14/18   Shamleffer, Melanie Crazier, MD  Continuous Blood Gluc Sensor (DEXCOM G6 SENSOR) MISC 1 kit by Does not apply route as directed. 05/14/18   Shamleffer, Melanie Crazier, MD  Continuous Blood Gluc Transmit (DEXCOM G6 TRANSMITTER) MISC 1 kit by Does not apply route as directed. 05/14/18   Shamleffer, Melanie Crazier, MD  Cranberry 1000 MG CAPS Take 1,000 mg by mouth 2 (two) times daily.     [provider]  digoxin (LANOXIN) 0.125 MG tablet Take 1 tablet (0.125 mg total) by mouth daily. Patient needs appointment for further refills 04/26/18   Tommie Raymond, NP  ezetimibe (ZETIA) 10 MG tablet Take 10  mg by mouth daily.    [provider]  glucose blood test strip Use as instructed to test blood sugars 3 times daily DX.E11.42 05/06/18   Shamleffer, Melanie Crazier, MD  Insulin Aspart (NOVOLOG ) Inject 8 Units into the skin 3 (three) times daily before meals.    [provider]  insulin glargine (LANTUS) 100 UNIT/ML injection Inject 25 Units into the skin daily. Daily in the am    [provider]  Insulin Pen Needle (BD PEN NEEDLE MICRO U/F) 32G X 6 MM MISC 4 times a day novolog(3) and lantus9(1) 05/17/18   Shamleffer, Melanie Crazier, MD  methocarbamol (ROBAXIN) 500 MG tablet Take 1 tablet (500 mg total) by mouth every 6 (six) hours as needed for muscle spasms. 11/13/17   [name], MD  metoprolol succinate (TOPROL-XL) 25 MG 24 hr tablet Take 1 tablet (25 mg total) by  mouth daily. Patient needs to schedule an appointment for an additional refill 04/26/18   Tommie Raymond, NP  Multiple Vitamins-Minerals (PRESERVISION AREDS 2 PO) Take 1 capsule by mouth 2 (two) times daily.    [provider]  nitroGLYCERIN (NITROSTAT) 0.4 MG SL tablet Place 0.4 mg under the tongue every 5 (five) minutes as needed for chest pain.     [provider]  omeprazole (PRILOSEC) 20 MG capsule Take 1 capsule (20 mg total) by mouth daily. Patient taking differently: Take 20 mg by mouth daily. Daily in the morning 05/01/17   [name], [name], MD  ondansetron (ZOFRAN) 4 MG tablet Take 1 tablet (4 mg total) by mouth every 8 (eight) hours as needed for nausea or vomiting. 01/29/18   Thurnell Lose, MD  ramipril (ALTACE) 10 MG capsule TAKE 1 CAPSULE (10 MG TOTAL) BY MOUTH EVERY EVENING. 02/04/18   [name], [name], MD  RELION INSULIN SYRINGE 1ML/31G 31G X 5/16" 1 ML MISC Use as directed 01/30/17   [name], [name], MD  rOPINIRole (REQUIP) 1 MG tablet TAKE ONE TABLET BY MOUTH AT BEDTIME. Patient taking differently: Take 1 mg by mouth at bedtime.  12/25/17   [name], [name], MD   tamsulosin (FLOMAX) 0.4 MG CAPS capsule TAKE 1 CAPSULE BY MOUTH EVERY DAY 05/13/18   [name], [name], MD  TURMERIC PO Take by mouth.    [provider]  verapamil (CALAN) 120 MG tablet Take 120 mg by mouth daily as needed (afib).     [provider]  verapamil (VERELAN PM) 240 MG 24 hr capsule Take 1 capsule (240 mg total) by mouth daily. 04/26/18   Tommie Raymond, NP    Family History Family History  Problem Relation Age of Onset  . Heart failure Mother   . Hyperlipidemia Mother   . Lung cancer Father        Lymphoma  . Hyperlipidemia Maternal Grandmother   . Peripheral vascular disease Maternal Grandmother     Social History Social History   Tobacco Use  . Smoking status: Former Research scientist (life sciences)  . Smokeless tobacco: Never Used  Substance Use Topics  . Alcohol use: No  . Drug use: No     Allergies   Amoxil [amoxicillin]; Chocolate; Demerol; Lisinopril; Meperidine; Meperidine hcl; and Other   Review of Systems Review of Systems  Constitutional: Positive for appetite change and chills. Negative for diaphoresis, fatigue, fever and weight loss.  HENT: Negative for congestion and rhinorrhea.   Eyes: Negative for visual disturbance.  Respiratory: Positive for cough. Negative for chest tightness, shortness of breath, wheezing and stridor.   Cardiovascular: Negative for chest pain, palpitations and leg swelling.  Gastrointestinal: Positive for nausea. Negative for abdominal pain, constipation, diarrhea and vomiting.  Genitourinary: Negative for flank pain and frequency.  Musculoskeletal: Positive for myalgias. Negative for back pain, neck pain and neck stiffness.  Skin: Negative for rash and wound.  Neurological: Positive for headaches. Negative for dizziness, seizures, speech difficulty, weakness and light-headedness.  Psychiatric/Behavioral: Negative for agitation and confusion.  All other systems reviewed and are negative.    Physical Exam Updated Vital Signs  Pulse 88   Temp (!) 100.4 F (38 C) (Rectal)   Resp 20   Ht 5' 4"  (1.626 m)   Wt 84.8 kg   SpO2 100%   BMI 32.10 kg/m   Physical Exam Vitals signs and nursing note reviewed.  Constitutional:      General: She is not in acute  distress.    Appearance: She is well-developed. She is not ill-appearing, toxic-appearing or diaphoretic.  HENT:     Head: Normocephalic and atraumatic.     Right Ear: External ear normal.     Left Ear: External ear normal.     Nose: Nose normal. No congestion or rhinorrhea.     Mouth/Throat:     Mouth: Mucous membranes are moist.     Pharynx: No oropharyngeal exudate or posterior oropharyngeal erythema.  Eyes:     Extraocular Movements: Extraocular movements intact.     Conjunctiva/sclera: Conjunctivae normal.     Pupils: Pupils are equal, round, and reactive to light.  Neck:     Musculoskeletal: Normal range of motion and neck supple. No muscular tenderness.  Cardiovascular:     Rate and Rhythm: Normal rate.     Pulses: Normal pulses.     Heart sounds: No murmur.  Pulmonary:     Effort: Pulmonary effort is normal. No respiratory distress.     Breath sounds: No stridor. Rhonchi present. No wheezing or rales.  Chest:     Chest wall: No tenderness.  Abdominal:     General: Abdomen is flat. There is no distension.     Tenderness: There is no abdominal tenderness. There is no right CVA tenderness, left CVA tenderness, guarding or rebound.  Musculoskeletal:        General: Tenderness present. No signs of injury. Deformity: right anterior thigh, reporedly chronic.     Right lower leg: No edema.     Left lower leg: No edema.  Skin:    General: Skin is warm.     Capillary Refill: Capillary refill takes less than 2 seconds.     Coloration: Skin is not pale.     Findings: No erythema or rash.  Neurological:     General: No focal deficit present.     Mental Status: She is alert and oriented to person, place, and time.     Sensory: No sensory deficit.      Motor: No weakness or abnormal muscle tone.     Deep Tendon Reflexes: Reflexes are normal and symmetric.  Psychiatric:        Mood and Affect: Mood normal.      ED Treatments / Results  Labs (all labs ordered are listed, but only abnormal results are displayed) Labs Reviewed - No data to display  EKG None  Radiology Dg Chest Portable 1 View  Result Date: 05/27/2018 CLINICAL DATA:  Cough and fever.  Nausea. EXAM: PORTABLE CHEST 1 VIEW COMPARISON:  Chest x-ray dated 03/16/2016 FINDINGS: The heart size and mediastinal contours are within normal limits. Both lungs are clear. No acute bone abnormality. Lower cervical fusions. IMPRESSION: No active disease. Electronically Signed   By: Lorriane Shire M.D.   On: 05/27/2018 12:16    Procedures Procedures (including critical care time)  Medications Ordered in ED Medications - No data to display   Initial Impression / Assessment and Plan / ED Course  I have reviewed the triage vital signs and the nursing notes.  Pertinent labs & imaging results that were available during my care of the patient were reviewed by me and considered in my medical decision making (see chart for details).        CATRICE ZULETA is a 78 y.o. female with a past medical history significant for paroxysmal atrial tachycardia, hypertension, diabetes, hyperlipidemia, 5 myalgia, depression, and GERD who presents with fevers, chills, dry cough, and malaise.  Patient  reports that for the last 2 days she has been having fevers and chills.  She did not take her temperature at home but was 100.4 here.  Patient denies chest pain or shortness of breath but is just having the dry cough.  She reported that the nausea and decreased appetite but no emesis.  She reports she chronically has leg cramps for many months and this is been ongoing.  She reports that previously she had severe drowsiness and fatigue with muscle relaxant.  Patient does reports a mild headache but no neck  pain or neck stiffness.  No focal neurologic deficits reported.  No vision changes.   She denies other complaints at this time.  On exam, lungs have some coarse rhonchi bilaterally.  Chest and abdomen are nontender.  Legs are nonedematous.  Right anterior thigh is tender to palpation which she reports is where that she is having the muscle spasms.  She denies any new swelling or concern for DVT.  Patient has symmetric radial pulses in lower extremity pulses.  Oxygen saturations are 97 to 100% on room air.  Patient was barely febrile.  Not tachycardic or hypotensive.  Based on the ongoing pandemic in the community with the patient's fever malaise, and cough, I am concerned she may have developed rotavirus.  Patient not hypoxic, based on current hospital policy, she will likely be stable for discharge home and thus, will not be getting the coronavirus test.  Due to the rhonchi, patient will have chest x-ray to look for pneumonia.  Anticipate discharge either with or without antibiotics for pneumonia.  Anticipate reassessment.     X-ray shows no pneumonia.  Patient was feeling better.  Patient maintain her oxygen saturations on room air.  Suspect patient may have normal coronavirus given the community spread at this time however as she is now hypoxic we feel she is safe for discharge home.  Patient will follow-up with her PCP for further management of her muscle spasms.  Patient agrees with this.  Patient discharged in good condition.   Final Clinical Impressions(s) / ED Diagnoses   Final diagnoses:  Upper respiratory tract infection, unspecified type  Cough    ED Discharge Orders    None      Clinical Impression: 1. Upper respiratory tract infection, unspecified type   2. Cough     Disposition: Discharge  Condition: Good  I have discussed the results, Dx and Tx plan with the pt(& family if present). He/she/they expressed understanding and agree(s) with the plan. Discharge instructions  discussed at great length. Strict return precautions discussed and pt &/or family have verbalized understanding of the instructions. No further questions at time of discharge.    New Prescriptions   No medications on file    Follow Up: [name], [name], [place] [address] [facility] [address] [id number] [place]       Charlottie Peragine, Gwenyth Allegra, MD 05/27/18 [phone]

## 2018-05-27 NOTE — Telephone Encounter (Signed)
FYI

## 2018-05-27 NOTE — Telephone Encounter (Signed)
Daughter, [name] called in concerned about her mother being so weak this morning she can't sit on the side of the bed much less get to the bathroom.   She has been weak over the weekend and running a low grade fever however the weakness is much worse this morning.  No shortness of breath or cough or known exposures to the COVID-19 however she has been in contact with her 24/7 caregivers that come and go plus me and my family though none of [place] have been sick.  I encouraged her to take her mother to the ED at [facility] or call EMS if daughter feels her mother is too weak to get her to the car.  [name] agreed to this plan.   The extreme weakness and ongoing low grade fever is concerning.   [name] said she is going to see if her mother can get into her transport chair.   If she can't then EMS will be called.  I let [name] know I would send these notes to Dr. [name].  I sent these triage notes to Dr. Doug Sou nurse pool.    Reason for Disposition . [1] SEVERE weakness (i.e., unable to walk or barely able to walk, requires support) AND     [2] new onset or worsening  Answer Assessment - Initial Assessment Questions 1. DESCRIPTION: "Describe how you are feeling."     Friday started with body aches.  Thought fibromyalgia.   She is very weak.   Saturday afternoon took muscle relaxer 2:00.   Went to toilet and was too weka to get off of the toilet.   She has a caregver 24 hrs aday.    EMS was called to get her off the toilet.   She uses a walker.   EMS got her to the bed.   VS were fine except for BP slightly elevated but nothing to be concerned with considering the circumstances.     Robaxin was the muscle relaxer.    She stand in bed all Saturday night.   Sunday morning her caregiver called.   Thought she had a fever.  She had a low grade fever.   100.1.   100.3 is the highest fever.   Daughter Lasandra Beech is calling in for her mother. At 2:30am tried to get to toilet and she's so  weak she can't sit on the side of the bed.   So I knew she would not make it to the toilet.   We put a diaper a her.    Blood sugar was 172.   I thought maybe it was the muscle relaxer.    I called the nurse line Sunday morning.   They said to observe her yesterday at 10:00.   2. SEVERITY: "How bad is it?"  "Can you stand and walk?"   - MILD - Feels weak or tired, but does not interfere with work, school or normal activities   - [place] to stand and walk; weakness interferes with work, school, or normal activities   - SEVERE - Unable to stand or walk     Unable to stand or walk this morning.   She said she had a bad headache so daughter gave her Tylenol. 3. ONSET:  "When did the weakness begin?"     See above.   Temp is 98.7 now. 4. CAUSE: "What do you think is causing the weakness?"     Daughter works in [employer] at [employer].  So no one that we know of has been sick   She's been in contact with her caregivers.   5. MEDICINES: "Have you recently started a new medicine or had a change in the amount of a medicine?"     No 6. OTHER SYMPTOMS: "Do you have any other symptoms?" (e.g., chest pain, fever, cough, SOB, vomiting, diarrhea, bleeding, other areas of pain)     Breathing is fine.  Just the weakness and low grade fever.    7. PREGNANCY: "Is there any chance you are pregnant?" "When was your last menstrual period?"     Not asked due to age.  Protocols used: WEAKNESS (GENERALIZED) AND FATIGUE-A-AH

## 2018-05-27 NOTE — Discharge Instructions (Signed)
Your history and exam today are concerning for viral upper respiratory infection causing your symptoms.  Based on the pandemic in the community currently, we are concerned you may have coronavirus.  Based on the protocol here in the emergency department at this time, we are not testing those patients who are safe for discharge home and have normal oxygen saturations which you have.  We do not feel he needs admission thus, you will not be tested for coronavirus.  We are going to treat you as if you do have it and recommend you self quarantine.  See below the quarantine instructions.  If any symptoms change or worsen or you feel your breathing is not improving, please return to the nearest emergency department.     Person Under Monitoring Name: [name]  Location: [address] [place] [address]   Infection Prevention Recommendations for Individuals Confirmed to have, or Being Evaluated for, 2019 Novel Coronavirus (COVID-19) Infection Who Receive Care at Home  Individuals who are confirmed to have, or are being evaluated for, COVID-19 should follow the prevention steps below until a healthcare provider or local or state health department says they can return to normal activities.  Stay home except to get medical care You should restrict activities outside your home, except for getting medical care. Do not go to work, school, or public areas, and do not use public transportation or taxis.  Call ahead before visiting your doctor Before your medical appointment, call the healthcare provider and tell them that you have, or are being evaluated for, COVID-19 infection. This will help the healthcare providers office take steps to keep other people from getting infected. Ask your healthcare provider to call the local or state health department.  Monitor your symptoms Seek prompt medical attention if your illness is worsening (e.g., difficulty breathing). Before going to your  medical appointment, call the healthcare provider and tell them that you have, or are being evaluated for, COVID-19 infection. Ask your healthcare provider to call the local or state health department.  Wear a facemask You should wear a facemask that covers your nose and mouth when you are in the same room with other people and when you visit a healthcare provider. People who live with or visit you should also wear a facemask while they are in the same room with you.  Separate yourself from other people in your home As much as possible, you should stay in a different room from other people in your home. Also, you should use a separate bathroom, if available.  Avoid sharing household items You should not share dishes, drinking glasses, cups, eating utensils, towels, bedding, or other items with other people in your home. After using these items, you should wash them thoroughly with soap and water.  Cover your coughs and sneezes Cover your mouth and nose with a tissue when you cough or sneeze, or you can cough or sneeze into your sleeve. Throw used tissues in a lined trash can, and immediately wash your hands with soap and water for at least 20 seconds or use an alcohol-based hand rub.  Wash your [facility] your hands often and thoroughly with soap and water for at least 20 seconds. You can use an alcohol-based hand sanitizer if soap and water are not available and if your hands are not visibly dirty. Avoid touching your eyes, nose, and mouth with unwashed hands.   Prevention Steps for Caregivers and Household Members of Individuals Confirmed to have, or Being  Evaluated for, COVID-19 Infection Being Cared for in the Home  If you live with, or provide care at home for, a person confirmed to have, or being evaluated for, COVID-19 infection please follow these guidelines to prevent infection:  Follow healthcare providers instructions Make sure that you understand and can help the  patient follow any healthcare provider instructions for all care.  Provide for the patients basic needs You should help the patient with basic needs in the home and provide support for getting groceries, prescriptions, and other personal needs.  Monitor the patients symptoms If they are getting sicker, call his or her medical provider and tell them that the patient has, or is being evaluated for, COVID-19 infection. This will help the healthcare providers office take steps to keep other people from getting infected. Ask the healthcare provider to call the local or state health department.  Limit the number of people who have contact with the patient If possible, have only one caregiver for the patient. Other household members should stay in another home or place of residence. If this is not possible, they should stay in another room, or be separated from the patient as much as possible. Use a separate bathroom, if available. Restrict visitors who do not have an essential need to be in the home.  Keep older adults, very young children, and other sick people away from the patient Keep older adults, very young children, and those who have compromised immune systems or chronic health conditions away from the patient. This includes people with chronic heart, lung, or kidney conditions, diabetes, and cancer.  Ensure good ventilation Make sure that shared spaces in the home have good air flow, such as from an air conditioner or an opened window, weather permitting.  Wash your hands often Wash your hands often and thoroughly with soap and water for at least 20 seconds. You can use an alcohol based hand sanitizer if soap and water are not available and if your hands are not visibly dirty. Avoid touching your eyes, nose, and mouth with unwashed hands. Use disposable paper towels to dry your hands. If not available, use dedicated cloth towels and replace them when they become wet.  Wear a  facemask and gloves Wear a disposable facemask at all times in the room and gloves when you touch or have contact with the patients blood, body fluids, and/or secretions or excretions, such as sweat, saliva, sputum, nasal mucus, vomit, urine, or feces.  Ensure the mask fits over your nose and mouth tightly, and do not touch it during use. Throw out disposable facemasks and gloves after using them. Do not reuse. Wash your hands immediately after removing your facemask and gloves. If your personal clothing becomes contaminated, carefully remove clothing and launder. Wash your hands after handling contaminated clothing. Place all used disposable facemasks, gloves, and other waste in a lined container before disposing them with other household waste. Remove gloves and wash your hands immediately after handling these items.  Do not share dishes, glasses, or other household items with the patient Avoid sharing household items. You should not share dishes, drinking glasses, cups, eating utensils, towels, bedding, or other items with a patient who is confirmed to have, or being evaluated for, COVID-19 infection. After the person uses these items, you should wash them thoroughly with soap and water.  Wash laundry thoroughly Immediately remove and wash clothes or bedding that have blood, body fluids, and/or secretions or excretions, such as sweat, saliva, sputum, nasal mucus, vomit,  urine, or feces, on them. Wear gloves when handling laundry from the patient. Read and follow directions on labels of laundry or clothing items and detergent. In general, wash and dry with the warmest temperatures recommended on the label.  Clean all areas the individual has used often Clean all touchable surfaces, such as counters, tabletops, doorknobs, bathroom fixtures, toilets, phones, keyboards, tablets, and bedside tables, every day. Also, clean any surfaces that may have blood, body fluids, and/or secretions or excretions  on them. Wear gloves when cleaning surfaces the patient has come in contact with. Use a diluted bleach solution (e.g., dilute bleach with 1 part bleach and 10 parts water) or a household disinfectant with a label that says EPA-registered for coronaviruses. To make a bleach solution at home, add 1 tablespoon of bleach to 1 quart (4 cups) of water. For a larger supply, add  cup of bleach to 1 gallon (16 cups) of water. Read labels of cleaning products and follow recommendations provided on product labels. Labels contain instructions for safe and effective use of the cleaning product including precautions you should take when applying the product, such as wearing gloves or eye protection and making sure you have good ventilation during use of the product. Remove gloves and wash hands immediately after cleaning.  Monitor yourself for signs and symptoms of illness Caregivers and household members are considered close contacts, should monitor their health, and will be asked to limit movement outside of the home to the extent possible. Follow the monitoring steps for close contacts listed on the symptom monitoring form.   ? If you have additional questions, contact your local health department or call the epidemiologist on call at [phone] (available 24/7). ? This guidance is subject to change. For the most up-to-date guidance from [facility], please refer to their website: YouBlogs.pl

## 2018-05-31 DIAGNOSIS — G959 Disease of spinal cord, unspecified: Secondary | ICD-10-CM | POA: Diagnosis not present

## 2018-06-04 ENCOUNTER — Other Ambulatory Visit: Payer: Self-pay | Admitting: Family Medicine

## 2018-06-05 ENCOUNTER — Other Ambulatory Visit: Payer: Self-pay | Admitting: Internal Medicine

## 2018-06-05 ENCOUNTER — Telehealth: Payer: Self-pay | Admitting: Internal Medicine

## 2018-06-05 DIAGNOSIS — Z794 Long term (current) use of insulin: Secondary | ICD-10-CM

## 2018-06-05 DIAGNOSIS — E1142 Type 2 diabetes mellitus with diabetic polyneuropathy: Secondary | ICD-10-CM

## 2018-06-05 MED ORDER — DEXCOM G6 SENSOR MISC: 1.0000 | 3 refills | Status: DC

## 2018-06-05 MED ORDER — DEXCOM G6 RECEIVER DEVI: 1.0000 | Freq: Every day | 0 refills | Status: DC

## 2018-06-05 MED ORDER — DEXCOM G6 TRANSMITTER MISC: 1.0000 | 3 refills | Status: DC

## 2018-06-05 NOTE — Telephone Encounter (Signed)
Message sent to Dr Kelton Pillar:  "Dr Kelton Pillar, [facility] a [place] sensor, transmitter, and receiver.  It was "sent" May 14, 2018, but according to [name] her daughter they have yet be able to pick it up.  [facility] states that [facility] did not receive an RX for these items.  Valerie's number for call back if you need clarification is [phone] or I can call her back with information..Thanks, Belenda Cruise "

## 2018-06-07 ENCOUNTER — Telehealth: Payer: Self-pay | Admitting: Internal Medicine

## 2018-06-07 DIAGNOSIS — E1142 Type 2 diabetes mellitus with diabetic polyneuropathy: Secondary | ICD-10-CM

## 2018-06-07 DIAGNOSIS — Z794 Long term (current) use of insulin: Secondary | ICD-10-CM

## 2018-06-07 MED ORDER — DEXCOM G6 SENSOR MISC: 1.0000 | 3 refills | Status: DC

## 2018-06-07 MED ORDER — DEXCOM G6 TRANSMITTER MISC: 1.0000 | 3 refills | Status: DC

## 2018-06-07 MED ORDER — DEXCOM G6 RECEIVER DEVI: 1.0000 | Freq: Every day | 0 refills | Status: DC

## 2018-06-07 NOTE — Telephone Encounter (Signed)
Pt daughter made aware.

## 2018-06-07 NOTE — Telephone Encounter (Signed)
Printed medication prescription and faxed as requested.

## 2018-06-07 NOTE — Telephone Encounter (Signed)
Per [name] patient daughter the [place] sensor, transmitter, and receiver was sent to [facility], but [facility] is not in network.  It actually needs to be sent to [facility] since they are in network for [employer].    Contact information for Oletta Lamas is fax [phone] phone [phone]

## 2018-06-18 ENCOUNTER — Telehealth: Payer: Self-pay | Admitting: *Deleted

## 2018-06-18 NOTE — Telephone Encounter (Signed)
Noted. If medications are appropriate for 90 day supply, they will be done. Nothing further needed at this time.

## 2018-06-18 NOTE — Telephone Encounter (Signed)
Copied from [name] [id number]. Topic: General - Other >> Jun 18, 2018  9:48 AM Carolyn Stare wrote:  Pt daughter call and req a 90 day supply when it's time for refill

## 2018-06-21 ENCOUNTER — Encounter: Payer: Self-pay | Admitting: Internal Medicine

## 2018-06-21 ENCOUNTER — Other Ambulatory Visit: Payer: Self-pay

## 2018-06-21 ENCOUNTER — Ambulatory Visit (INDEPENDENT_AMBULATORY_CARE_PROVIDER_SITE_OTHER): Payer: PPO | Admitting: Internal Medicine

## 2018-06-21 VITALS — BP 112/70 | HR 68 | Temp 98.2°F | Wt 185.0 lb

## 2018-06-21 DIAGNOSIS — Z794 Long term (current) use of insulin: Secondary | ICD-10-CM

## 2018-06-21 DIAGNOSIS — E1142 Type 2 diabetes mellitus with diabetic polyneuropathy: Secondary | ICD-10-CM

## 2018-06-21 LAB — POCT GLYCOSYLATED HEMOGLOBIN (HGB A1C): Hemoglobin A1C: 5.9 % — AB (ref 4.0–5.6)

## 2018-06-21 NOTE — Patient Instructions (Addendum)
-   Decrease Lantus 20 units ONCE a day  - Continue Novolog 8 units with meals  - Check sugar before meals and bedtime    Choose healthy, lower carb lower calorie snacks: toss salad, cooked vegetables, cottage cheese, peanut butter, low fat cheese / string cheese, lower sodium deli meat, tuna salad or chicken salad    HOW TO TREAT LOW BLOOD SUGARS (Blood sugar LESS THAN 70 MG/DL)  Please follow the RULE OF 15 for the treatment of hypoglycemia treatment (when your (blood sugars are less than 70 mg/dL)    STEP 1: Take 15 grams of carbohydrates when your blood sugar is low, which includes:   3-4 GLUCOSE TABS  OR  3-4 OZ OF JUICE OR REGULAR SODA OR  ONE TUBE OF GLUCOSE GEL     STEP 2: RECHECK blood sugar in 15 MINUTES STEP 3: If your blood sugar is still low at the 15 minute recheck --> then, go back to STEP 1 and treat AGAIN with another 15 grams of carbohydrates.

## 2018-06-21 NOTE — Progress Notes (Signed)
Name: [name]  Age/ Sex: 78 y.o., female   MRN/ DOB: [id number], [birth year]     PCP: [name], [name], MD   Reason for Endocrinology Evaluation: Type 2 diabetes Mellitus  Initial Endocrine Consultative Visit:  12/28/2017    PATIENT IDENTIFIER: [name] is a 78 y.o. female with a past medical history of PAT, T2 DM, and urinary incontinence, Hx of pancreatitis. The patient has followed with Endocrinology clinic since 12/28/2017 for consultative assistance with management of her diabetes.  DIABETIC HISTORY:  [name] was diagnosed with T2DM > 10 years ago.  She is intolerant to metformin due to fecal incontinence on 2000 mg a day. Her hemoglobin A1c has ranged from 6.7% in 09/01/2017, peaking at 7.9% in 2011.   SUBJECTIVE:   During the last visit (04/28/2018): Her A1c was 6.2%, we continued lantus and Novolog doses.   Today (06/21/2018): [name] is here for a 4 week follow up for diabetes management. She is with her daughter [name].  She checks her blood sugars 4 times daily, preprandial to breakfast and bedtime. The patient has not had hypoglycemic episodes since the last clinic visit. Otherwise, the patient has not required any recent emergency interventions for hypoglycemia and has not had recent hospitalizations secondary to hyper or hypoglycemic episodes.    ROS: As per HPI and as detailed below: Review of Systems  Constitutional: Negative for fever and malaise/fatigue.  Respiratory: Negative.   Cardiovascular: Negative.   Genitourinary: Negative for frequency.  Skin: Negative.  Rash:    Neurological: Positive for tingling and focal weakness.  Endo/Heme/Allergies: Negative for polydipsia.      HOME DIABETES REGIMEN:  Lantus 25 units BID  Novolog 8 units TIDQAC  Statin: No ACE-I/ARB: Yes    METER DOWNLOAD SUMMARY:4/24-06/20/2018 Fingerstick Blood Glucose Tests = 54 Overall  Mean FS Glucose = 153.6 Standard Deviation = 40.9  BG Ranges: Low = 105 High = 255   Hypoglycemic Events/30 Days: BG < 50 = 0 Episodes of symptomatic severe hypoglycemia = 0        DIABETIC COMPLICATIONS: Microvascular complications:   Neuropathy   Denies: retinopathy, nephropathy   Last eye exam: Completed 2019  Macrovascular complications:    Denies: CAD, PVD, CVA   HISTORY:  Past Medical History:  Past Medical History:  Diagnosis Date  . Anemia    during 1st pregnancy  . Anxiety   . Arthritis   . Complication of anesthesia   . Depression   . Diarrhea, functional   . Dysrhythmia    Paroysmal Atrial Tachycardia  . Essential hypertension   . Fibromyalgia   . GERD (gastroesophageal reflux disease)   . H/O acute pancreatitis   . Headache   . History of hiatal hernia   . Hyperlipemia   . Osteoporosis   . PAT (paroxysmal atrial tachycardia) ([place])   . Pneumonia   . PONV (postoperative nausea and vomiting)   . PSVT (paroxysmal supraventricular tachycardia) ([place])   . Reflux   . Restless leg syndrome   . Type 2 diabetes mellitus (HCC)    Type II  . Vertigo    Past Surgical History:  Past Surgical History:  Procedure Laterality Date  . ABDOMINAL HYSTERECTOMY    . ANKLE SURGERY    . ANTERIOR CERVICAL DECOMP/DISCECTOMY FUSION N/A 11/12/2017   Procedure: Cervical seven to Thoracic one  Anterior cervical decompression/discectomy/fusion with removal of old plate;  Surgeon: [name], MD;  Location: [facility];  Service: Neurosurgery;  Laterality: N/A;  . BACK SURGERY     T12-L1 fusion, Anterior Cervical fusion  . CARDIAC CATHETERIZATION N/A 02/16/2015   Procedure: Left Heart Cath and Coronary Angiography;  Surgeon: Sherren Mocha, MD;  Location: [facility];  Service: Cardiovascular;  Laterality: N/A;  . CHOLECYSTECTOMY    . COLONOSCOPY    . ESOPHAGOGASTRODUODENOSCOPY  12/21/2010   Procedure: ESOPHAGOGASTRODUODENOSCOPY (EGD);  Surgeon: Landry Dyke, MD;  Location: [facility];  Service: Endoscopy;  Laterality: N/A;  . EXCISION MORTON'S NEUROMA    . EYE SURGERY Bilateral    Cataract  . FRACTURE SURGERY     ankle x 2   . KNEE SURGERY     x 3  . NECK SURGERY    . POSTERIOR CERVICAL FUSION/FORAMINOTOMY N/A 01/22/2018   Procedure: Cervical six-seven Posterior Decompression with Posterior Fixation Cervical Five to Thoracic Two;  Surgeon: [name], MD;  Location: [facility];  Service: Neurosurgery;  Laterality: N/A;  Cervical 6-7 Posteror decompression with posterior fixation Cervical 5 to Thoracic 2  . ROTATOR CUFF REPAIR    . TONSILLECTOMY    . TOTAL KNEE ARTHROPLASTY Left 04/10/2017   Procedure: LEFT TOTAL KNEE ARTHROPLASTY;  Surgeon: Paralee Cancel, MD;  Location: [facility];  Service: Orthopedics;  Laterality: Left;  70 mins  . UPPER GASTROINTESTINAL ENDOSCOPY    . WRIST SURGERY      Social History:  reports that she has quit smoking. She has never used smokeless tobacco. She reports that she does not drink alcohol or use drugs. Family History:  Family History  Problem Relation Age of Onset  . Heart failure Mother   . Hyperlipidemia Mother   . Lung cancer Father        Lymphoma  . Hyperlipidemia Maternal Grandmother   . Peripheral vascular disease Maternal Grandmother      HOME MEDICATIONS: Allergies as of 06/21/2018      Reactions   Amoxil [amoxicillin] Swelling, Other (See Comments)   Lips Has patient had a PCN reaction causing immediate rash, facial/tongue/throat swelling, SOB or lightheadedness with hypotension:YES Has patient had a PCN reaction causing severe rash involving mucus membranes or skin necrosis:No Has patient had a PCN reaction that required hospitalization:No Has patient had a PCN reaction occurring within the last 10 years:Yes. If all of the above answers are "NO", then may proceed with Cephalosporin use.   Chocolate Hives   Demerol Nausea And Vomiting   Lisinopril Itching, Swelling   Other    Raw  food or nuts - GI pain      Medication List       Accurate as of Jun 21, 2018  2:29 PM. If you have any questions, ask your nurse or doctor.        acetaminophen 325 MG tablet Commonly known as:  TYLENOL Take 325 mg by mouth. 1 tablet at 8 am and 1 tablet prn   aspirin EC 81 MG tablet Take 81 mg by mouth every evening.   Calcium 600+D3 600-200 MG-UNIT Tabs Generic drug:  Calcium Carb-Cholecalciferol Take 1 tablet by mouth every evening. Calcium 600 mg -Vitamin D 200 mg   cholecalciferol 1000 units tablet Commonly known as:  VITAMIN D Take 1,000 Units by mouth daily. Daily in the am   Cranberry 450 MG Caps Take 2 capsules in AM   Dexcom G6 Receiver Devi 1 Device by Does not apply route daily.   Dexcom G6 Sensor Misc 1 kit by Does not apply route as directed.  Dexcom G6 Transmitter Misc 1 kit by Does not apply route as directed.   digoxin 0.125 MG tablet Commonly known as:  Lanoxin Take 1 tablet (0.125 mg total) by mouth daily. Patient needs appointment for further refills   ezetimibe 10 MG tablet Commonly known as:  ZETIA Take 10 mg by mouth daily.   glucose blood test strip Use as instructed to test blood sugars 3 times daily [id number]   insulin glargine 100 UNIT/ML injection Commonly known as:  LANTUS Inject 25 Units into the skin daily. Daily in the am   Insulin Pen Needle 32G X 6 MM Misc Commonly known as:  BD Pen Needle Micro U/F 4 times a day novolog(3) and lantus9(1)   metoprolol succinate 25 MG 24 hr tablet Commonly known as:  TOPROL-XL Take 1 tablet (25 mg total) by mouth daily. Patient needs to schedule an appointment for an additional refill   nitroGLYCERIN 0.4 MG SL tablet Commonly known as:  NITROSTAT Place 0.4 mg under the tongue every 5 (five) minutes as needed for chest pain.   NOVOLOG [place] Inject 8 Units into the skin 3 (three) times daily before meals.   omeprazole 20 MG capsule Commonly known as:  PRILOSEC Take 1 capsule (20 mg  total) by mouth daily.   ondansetron 4 MG tablet Commonly known as:  Zofran Take 1 tablet (4 mg total) by mouth every 8 (eight) hours as needed for nausea or vomiting.   PRESERVISION AREDS 2 PO Take 2 tablets by mouth daily.   ramipril 10 MG capsule Commonly known as:  ALTACE TAKE 1 CAPSULE (10 MG TOTAL) BY MOUTH EVERY EVENING.   RELION INSULIN SYRINGE 1ML/31G 31G X 5/16" 1 ML Misc Generic drug:  Insulin Syringe-Needle U-100 Use as directed   rOPINIRole 1 MG tablet Commonly known as:  REQUIP TAKE ONE TABLET BY MOUTH AT BEDTIME.   tamsulosin 0.4 MG Caps capsule Commonly known as:  FLOMAX TAKE 1 CAPSULE BY MOUTH EVERY DAY   TURMERIC PO Take 500 mg by mouth 2 (two) times daily.   verapamil 120 MG tablet Commonly known as:  CALAN Take 120 mg by mouth daily as needed (afib).   verapamil 240 MG 24 hr capsule Commonly known as:  VERELAN PM Take 1 capsule (240 mg total) by mouth daily.        OBJECTIVE:   Vital Signs: BP 112/70 (BP Location: Right Arm, Patient Position: Sitting, Cuff Size: Normal)   Pulse 68   Temp 98.2 F (36.8 C) (Oral)   Wt 185 lb (83.9 kg)   SpO2 98%   BMI 31.76 kg/m   Wt Readings from Last 3 Encounters:  06/21/18 185 lb (83.9 kg)  05/27/18 187 lb (84.8 kg)  04/26/18 191 lb (86.6 kg)     Exam: General: Pt appears well and is in NAD  Neck: General: Supple without adenopathy. Thyroid: Thyroid size normal.  No goiter or nodules appreciated. No thyroid bruit.  Lungs: Clear with good BS bilat with no rales, rhonchi, or wheezes  Heart: RRR with normal S1 and S2 and no gallops; no murmurs; no rub  Abdomen: Normoactive bowel sounds, soft, nontender, without masses or organomegaly palpable  Extremities: No pretibial edema. No tremor. Normal strength and motion throughout. See detailed diabetic foot exam below.  Neuro: MS is good with appropriate affect, pt is alert and Ox3    DM foot exam:04/12/2018 The skin of the feet is intact without sores  or ulcerations. The pedal pulses are 2+ on  right and 2+ on left. The sensation is intact to a screening 5.07, 10 gram monofilament bilaterally            DATA REVIEWED:  Lab Results  Component Value Date   HGBA1C 6.2 (A) 04/12/2018   HGBA1C 7.6 (H) 11/12/2017   HGBA1C 6.7 (H) 09/04/2017   Lab Results  Component Value Date   MICROALBUR 26.1 (H) 01/12/2017   LDLCALC 82 01/20/2016   CREATININE 0.86 01/25/2018   Lab Results  Component Value Date   MICRALBCREAT 14.1 01/12/2017     ASSESSMENT / PLAN / RECOMMENDATIONS:   1) Type 2 Diabetes Mellitus, With  neuropathic complications - Most recent A1c of 5.9 %. Goal A1c < 7.0 %.   -In review of her meter download today, her BG's have been within acceptable range, without hypoglycemia. -She is intolerant to metformin, patient did state that she had pancreatitis back in 2014, I reviewed her hospitalization and her lipase has only been at 127, patient was diagnosed with gastroenteritis there is no mention of pancreatitis.  This was discussed with the patient, but daughter is concerned, so we will not offer DPP-4 inhibitors nor GLP-1 agonists  -She is again concerned about Trulicity, especially with the risk of nausea and diarrhea, but also because a cousin has some form of thyroid cancer.  -I explained to her that unfortunately given her age and above concerns, we are limited in starting any other glycemic agents and insulin is the safest at this time. - She was approved by [employer] , but has not received it yet, she was advised to call our office to schedule an appointment with our CDE for CGM education.   MEDICATIONS:  Decrease Lantus to 20 units once a day  NovoLog 8 units 3 times daily q. before meals  EDUCATION / INSTRUCTIONS:  BG monitoring instructions: Patient is instructed to check her blood sugars 4 times a day, before meals and bedtime.  Call [facility] if: BG persistently < 70 or > 300. . I reviewed  the Rule of 15 for the treatment of hypoglycemia in detail with the patient. Literature supplied.    2) Diabetic complications:   Eye: Does not have known diabetic retinopathy.   Neuro/ Feet: Does have known diabetic peripheral neuropathy .   Renal: Patient does not have known baseline CKD. She   is on an ACEI/ARB at present.     F/U in 3 months    signed electronically by: Mack Guise, MD  [facility]  [address] [facility] [address] [place], [place] [address] Phone: [phone] FAX: [phone]   CC: [name], [name], [place] [address] Phone: [phone]  Fax: [phone]  Return to Endocrinology clinic as below: Future Appointments  Date Time Provider [place]  09/13/2018  1:00 PM [facility]

## 2018-06-24 ENCOUNTER — Ambulatory Visit: Payer: PPO | Admitting: Diagnostic Neuroimaging

## 2018-06-24 DIAGNOSIS — E1149 Type 2 diabetes mellitus with other diabetic neurological complication: Secondary | ICD-10-CM | POA: Diagnosis not present

## 2018-06-28 ENCOUNTER — Other Ambulatory Visit: Payer: Self-pay

## 2018-06-28 ENCOUNTER — Ambulatory Visit (INDEPENDENT_AMBULATORY_CARE_PROVIDER_SITE_OTHER): Payer: PPO | Admitting: Family Medicine

## 2018-06-28 VITALS — HR 64 | Resp 16

## 2018-06-28 DIAGNOSIS — M797 Fibromyalgia: Secondary | ICD-10-CM | POA: Diagnosis not present

## 2018-06-28 DIAGNOSIS — M159 Polyosteoarthritis, unspecified: Secondary | ICD-10-CM

## 2018-06-28 DIAGNOSIS — F320 Major depressive disorder, single episode, mild: Secondary | ICD-10-CM | POA: Diagnosis not present

## 2018-06-28 DIAGNOSIS — I10 Essential (primary) hypertension: Secondary | ICD-10-CM

## 2018-06-28 DIAGNOSIS — G894 Chronic pain syndrome: Secondary | ICD-10-CM | POA: Diagnosis not present

## 2018-06-28 MED ORDER — MELOXICAM 15 MG PO TABS: 15.0000 mg | ORAL_TABLET | Freq: Every day | ORAL | 1 refills | Status: DC

## 2018-06-28 MED ORDER — DULOXETINE HCL 30 MG PO CPEP: 30.0000 mg | ORAL_CAPSULE | Freq: Every day | ORAL | 1 refills | Status: DC

## 2018-06-28 NOTE — Assessment & Plan Note (Signed)
We discussed some side effects of NSAIDs. Recommend monitoring BP regularly. No changes in current management.

## 2018-06-28 NOTE — Assessment & Plan Note (Signed)
Educated about diagnosis as well as treatment options. She took Cymbalta in the past and did well, so she agreed with starting Cymbalta 30 mg daily. We discussed some side effects. Low impact exercise and fall prevention.

## 2018-06-28 NOTE — Progress Notes (Signed)
Virtual Visit via Video Note   I connected with [name] and her daughter on 06/29/18 at  2:00 PM EDT by a video enabled telemedicine application and verified that I am speaking with the correct person using two identifiers.  Location patient: home Location provider:work Persons participating in the virtual visit: patient, provider  I discussed the limitations of evaluation and management by telemedicine and the availability of in person appointments. The patient expressed understanding and agreed to proceed.   HPI:  [name] is a 78 yo female with Hx of fibromyalgia,generalized OA,back pain,depression,anxiety,HTN,and DM II among some; who is c/o constant pain "from head to toe." Some affected areas are elbows,rib cage,back,hips,knees,ankles, "all over." Pain is exacerbated by weather changes,rainy days. Alleviated by rest.  Recently she had a "flare up" and it was "awful",she could not walk.  States that she is tire of hurting and she is "willing to take" opioid medications. Pain is severe.  She has not had trauma recently. According to her daughter,she refused continuing PT but [name] states that she had other reasons for which she refused service. She has not noted joint edema or erythema.  01/2018 she developed cervical epidural hematoma after a fall; she underwent cervical laminectomy and posterior decompression.  For fibromyalgia and generalized OA she has seen rheumatologist in the past,about 5 years ago. She follows with ortho for back pain. She has been taking Advil but it does not help for long.  Anxiety and depression,she was on Sertraline,which she discontinued a few months ago because she did not feel like she needed medication. Denies suicidal thoughts but frustrated due to pain.  HTN,she is not checking BP. She is on Ramipril 10 mg daily,Verapamil 240 mg daily,and Metoprolol succinate 25 mg daily. Negative for unusual headache,visual changes,chest  pain,dyspnea,ordecreased urine output.  Lab Results  Component Value Date   CREATININE 0.86 01/25/2018   BUN 16 01/25/2018   NA 134 (L) 01/25/2018   K 4.2 01/25/2018   CL 100 01/25/2018   CO2 24 01/25/2018    ROS: See pertinent positives and negatives per HPI.  Past Medical History:  Diagnosis Date  . Anemia    during 1st pregnancy  . Anxiety   . Arthritis   . Complication of anesthesia   . Depression   . Diarrhea, functional   . Dysrhythmia    Paroysmal Atrial Tachycardia  . Essential hypertension   . Fibromyalgia   . GERD (gastroesophageal reflux disease)   . H/O acute pancreatitis   . Headache   . History of hiatal hernia   . Hyperlipemia   . Osteoporosis   . PAT (paroxysmal atrial tachycardia) ([place])   . Pneumonia   . PONV (postoperative nausea and vomiting)   . PSVT (paroxysmal supraventricular tachycardia) ([place])   . Reflux   . Restless leg syndrome   . Type 2 diabetes mellitus (HCC)    Type II  . Vertigo     Past Surgical History:  Procedure Laterality Date  . ABDOMINAL HYSTERECTOMY    . ANKLE SURGERY    . ANTERIOR CERVICAL DECOMP/DISCECTOMY FUSION N/A 11/12/2017   Procedure: Cervical seven to Thoracic one  Anterior cervical decompression/discectomy/fusion with removal of old plate;  Surgeon: [name], MD;  Location: [facility];  Service: Neurosurgery;  Laterality: N/A;  . BACK SURGERY     T12-L1 fusion, Anterior Cervical fusion  . CARDIAC CATHETERIZATION N/A 02/16/2015   Procedure: Left Heart Cath and Coronary Angiography;  Surgeon: Sherren Mocha, MD;  Location: [facility]  INVASIVE CV LAB;  Service: Cardiovascular;  Laterality: N/A;  . CHOLECYSTECTOMY    . COLONOSCOPY    . ESOPHAGOGASTRODUODENOSCOPY  12/21/2010   Procedure: ESOPHAGOGASTRODUODENOSCOPY (EGD);  Surgeon: Landry Dyke, MD;  Location: [facility];  Service: Endoscopy;  Laterality: N/A;  . EXCISION MORTON'S NEUROMA    . EYE SURGERY Bilateral    Cataract  . FRACTURE SURGERY     ankle x 2   .  KNEE SURGERY     x 3  . NECK SURGERY    . POSTERIOR CERVICAL FUSION/FORAMINOTOMY N/A 01/22/2018   Procedure: Cervical six-seven Posterior Decompression with Posterior Fixation Cervical Five to Thoracic Two;  Surgeon: [name], MD;  Location: [facility];  Service: Neurosurgery;  Laterality: N/A;  Cervical 6-7 Posteror decompression with posterior fixation Cervical 5 to Thoracic 2  . ROTATOR CUFF REPAIR    . TONSILLECTOMY    . TOTAL KNEE ARTHROPLASTY Left 04/10/2017   Procedure: LEFT TOTAL KNEE ARTHROPLASTY;  Surgeon: Paralee Cancel, MD;  Location: [facility];  Service: Orthopedics;  Laterality: Left;  70 mins  . UPPER GASTROINTESTINAL ENDOSCOPY    . WRIST SURGERY      Family History  Problem Relation Age of Onset  . Heart failure Mother   . Hyperlipidemia Mother   . Lung cancer Father        Lymphoma  . Hyperlipidemia Maternal Grandmother   . Peripheral vascular disease Maternal Grandmother     Social History   Socioeconomic History  . Marital status: Widowed    Spouse name: Glendell Docker  . Number of children: 2  . Years of education: Not on file  . Highest education level: Not on file  Occupational History  . Not on file  Social Needs  . Financial resource strain: Not on file  . Food insecurity:    Worry: Not on file    Inability: Not on file  . Transportation needs:    Medical: Not on file    Non-medical: Not on file  Tobacco Use  . Smoking status: Former Research scientist (life sciences)  . Smokeless tobacco: Never Used  Substance and Sexual Activity  . Alcohol use: No  . Drug use: No  . Sexual activity: Not on file  Lifestyle  . Physical activity:    Days per week: Not on file    Minutes per session: Not on file  . Stress: Not on file  Relationships  . Social connections:    Talks on phone: Not on file    Gets together: Not on file    Attends religious service: Not on file    Active member of club or organization: Not on file    Attends meetings of clubs or organizations: Not on file     Relationship status: Not on file  . Intimate partner violence:    Fear of current or ex partner: Not on file    Emotionally abused: Not on file    Physically abused: Not on file    Forced sexual activity: Not on file  Other Topics Concern  . Not on file  Social History Narrative   Lives home alone.  Daughter [name] with her today.   Independent of ADLs.  Education HS.  Widowed.        Current Outpatient Medications:  .  acetaminophen (TYLENOL) 325 MG tablet, Take 325 mg by mouth. 1 tablet at 8 am and 1 tablet prn, Disp: , Rfl:  .  aspirin EC 81 MG tablet, Take 81 mg by mouth every  evening., Disp: , Rfl:  .  Calcium Carb-Cholecalciferol (CALCIUM 600+D3) 600-200 MG-UNIT TABS, Take 1 tablet by mouth every evening. Calcium 600 mg -Vitamin D 200 mg, Disp: , Rfl:  .  cholecalciferol (VITAMIN D) 1000 units tablet, Take 1,000 Units by mouth daily. Daily in the am, Disp: , Rfl:  .  Continuous Blood Gluc Receiver (DEXCOM G6 RECEIVER) DEVI, 1 Device by Does not apply route daily., Disp: 1 Device, Rfl: 0 .  Continuous Blood Gluc Sensor (DEXCOM G6 SENSOR) MISC, 1 kit by Does not apply route as directed., Disp: 3 each, Rfl: 3 .  Continuous Blood Gluc Transmit (DEXCOM G6 TRANSMITTER) MISC, 1 kit by Does not apply route as directed., Disp: 1 each, Rfl: 3 .  Cranberry 450 MG CAPS, Take 2 capsules in AM, Disp: , Rfl:  .  digoxin (LANOXIN) 0.125 MG tablet, Take 1 tablet (0.125 mg total) by mouth daily. Patient needs appointment for further refills, Disp: 90 tablet, Rfl: 3 .  DULoxetine (CYMBALTA) 30 MG capsule, Take 1 capsule (30 mg total) by mouth daily., Disp: 30 capsule, Rfl: 1 .  ezetimibe (ZETIA) 10 MG tablet, Take 10 mg by mouth daily., Disp: , Rfl:  .  glucose blood test strip, Use as instructed to test blood sugars 3 times daily DX.E11.42, Disp: 100 each, Rfl: 12 .  Insulin Aspart (NOVOLOG [place]), Inject 8 Units into the skin 3 (three) times daily before meals., Disp: , Rfl:  .  insulin glargine  (LANTUS) 100 UNIT/ML injection, Inject 20 Units into the skin daily. Daily in the am , Disp: , Rfl:  .  Insulin Pen Needle (BD PEN NEEDLE MICRO U/F) 32G X 6 MM MISC, 4 times a day novolog(3) and lantus9(1), Disp: 100 each, Rfl: 6 .  meloxicam (MOBIC) 15 MG tablet, Take 1 tablet (15 mg total) by mouth daily., Disp: 30 tablet, Rfl: 1 .  metoprolol succinate (TOPROL-XL) 25 MG 24 hr tablet, Take 1 tablet (25 mg total) by mouth daily. Patient needs to schedule an appointment for an additional refill, Disp: 90 tablet, Rfl: 3 .  Multiple Vitamins-Minerals (PRESERVISION AREDS 2 PO), Take 2 tablets by mouth daily., Disp: , Rfl:  .  nitroGLYCERIN (NITROSTAT) 0.4 MG SL tablet, Place 0.4 mg under the tongue every 5 (five) minutes as needed for chest pain. , Disp: , Rfl:  .  omeprazole (PRILOSEC) 20 MG capsule, Take 1 capsule (20 mg total) by mouth daily., Disp: 90 capsule, Rfl: 2 .  ondansetron (ZOFRAN) 4 MG tablet, Take 1 tablet (4 mg total) by mouth every 8 (eight) hours as needed for nausea or vomiting., Disp: 20 tablet, Rfl: 0 .  ramipril (ALTACE) 10 MG capsule, TAKE 1 CAPSULE (10 MG TOTAL) BY MOUTH EVERY EVENING., Disp: 90 capsule, Rfl: 0 .  RELION INSULIN SYRINGE 1ML/31G 31G X 5/16" 1 ML MISC, Use as directed, Disp: 100 each, Rfl: 1 .  rOPINIRole (REQUIP) 1 MG tablet, TAKE ONE TABLET BY MOUTH AT BEDTIME., Disp: 90 tablet, Rfl: 1 .  tamsulosin (FLOMAX) 0.4 MG CAPS capsule, TAKE 1 CAPSULE BY MOUTH EVERY DAY, Disp: 30 capsule, Rfl: 0 .  TURMERIC PO, Take 500 mg by mouth 2 (two) times daily. , Disp: , Rfl:  .  verapamil (CALAN) 120 MG tablet, Take 120 mg by mouth daily as needed (afib). , Disp: , Rfl:  .  verapamil (VERELAN PM) 240 MG 24 hr capsule, Take 1 capsule (240 mg total) by mouth daily., Disp: 90 capsule, Rfl: 3  EXAM:  VITALS per patient if applicable:Pulse 64   Resp 16   GENERAL: alert, oriented, appears well and in no acute distress  HEENT: atraumatic, conjunttiva clear, no obvious facial  abnormalities on inspection.  NECK: normal movements of the head and neck  LUNGS: on inspection no signs of respiratory distress, breathing rate appears normal, no obvious gross SOB, gasping or wheezing  CV: no obvious cyanosis.   Leah: moves all visible extremities without noticeable abnormality  PSYCH/NEURO: pleasant and cooperative, seems depressed and anxious.  ASSESSMENT AND PLAN:  Discussed the following assessment and plan:  Chronic pain disorder - Plan: Ambulatory referral to Pain Clinic We discussed current guideline in regard to opioids for chronic pain management. Some side effects discussed. She wants an appt for to pain management,referral placed.  Generalized osteoarthritis of multiple sites We discussed prognosis and treatment options. She agrees with trying Cymbalta. Low impact exercise,Tai Chi is a good option.   Current mild episode of major depressive disorder, unspecified whether recurrent ([place]) Problem still active. Cymbalta 30 mg daily added today. Instructed about warning signs.  Fibromyalgia Educated about diagnosis as well as treatment options. She took Cymbalta in the past and did well, so she agreed with starting Cymbalta 30 mg daily. We discussed some side effects. Low impact exercise and fall prevention.   Essential hypertension, benign We discussed some side effects of NSAIDs. Recommend monitoring BP regularly. No changes in current management.  When her daughter checked her HR,she mentions that heart is "irregular." Extrasystoles x 3. Asymptomatic. Reviewed prior EKG's, no arrhythmia appreciated.  Instructed to monitor HR daily. Instructed about warning sings.    I discussed the assessment and treatment plan with the patient. She and her daughter were provided an opportunity to ask questions and all were answered. The patient agreed with the plan and demonstrated an understanding of the instructions.   The patient was advised to call  back or seek an in-person evaluation if the symptoms worsen or if the condition fails to improve as anticipated.  Return in about 2 months (around 08/28/2018) for Fibro,HTN,pain.    Konnar Ben [name], MD

## 2018-06-29 ENCOUNTER — Encounter: Payer: Self-pay | Admitting: Family Medicine

## 2018-07-05 ENCOUNTER — Telehealth: Payer: Self-pay | Admitting: Family Medicine

## 2018-07-09 NOTE — Telephone Encounter (Addendum)
Caller name: Gibson,Valerie Relation to PO:[id number]  Call back [phone] Pharmacy: [id number] [place], [place] [phone] ([phone] (Fax)    Reason for call:  Daughter requesting 90 day supply stating this is her 2nd request, chart reflects 30 day supply was sent in, please advise

## 2018-07-10 ENCOUNTER — Other Ambulatory Visit: Payer: Self-pay | Admitting: *Deleted

## 2018-07-10 MED ORDER — TAMSULOSIN HCL 0.4 MG PO CAPS: 0.4000 mg | ORAL_CAPSULE | Freq: Every day | ORAL | 0 refills | Status: DC

## 2018-07-10 NOTE — Telephone Encounter (Signed)
Rx sent to the pharmacy.

## 2018-07-19 DIAGNOSIS — M5412 Radiculopathy, cervical region: Secondary | ICD-10-CM | POA: Diagnosis not present

## 2018-07-19 DIAGNOSIS — R200 Anesthesia of skin: Secondary | ICD-10-CM | POA: Diagnosis not present

## 2018-07-22 NOTE — Progress Notes (Signed)
Patient's Name: [name]  MRN: [id number]  Referring Provider: [name], [name], MD  DOB: [birth year]  PCP: [name], [name], MD  DOS: 07/25/2018  Note by: Gillis Santa, MD  Service setting: Ambulatory outpatient  Specialty: Interventional Pain Management  Location: [facility] Virtual Visit  Visit type: Initial Patient Evaluation  Patient type: New Patient   Pain Management Virtual Encounter Note - Virtual Visit via Telephone Telehealth (real-time audio visits between healthcare provider and patient).   Patient's Phone No.:  [phone] (home); [phone] (mobile); (Preferred) [phone] No e-mail address on record  [id number] [place], [place] [address] [place] [address] Phone: [phone] Fax: [phone]    Pre-screening note:  Our staff contacted [name] and offered her an "in person", "face-to-face" appointment versus a telephone encounter. She indicated preferring the telephone encounter, at this time.  Primary Reason(s) for Visit: Tele-Encounter for initial evaluation of one or more chronic problems (new to examiner) potentially causing chronic pain, and posing a threat to normal musculoskeletal function. (Level of risk: High) CC: Fibromyalgia, muscle pain, back pain I contacted [name] on 07/25/2018 via telephone.      I clearly identified myself as Gillis Santa, MD.  Patient did not pick up however her daughter did.  She was apologetic and stated that her mother was not interested in seeing pain management at the time being since she is getting satisfactory pain relief with Cymbalta that Dr. Betty [name] has prescribed her.  I informed the patient's daughter that this would be a no charge visit and to contact us in the future if we can be of any assistance.

## 2018-07-23 DIAGNOSIS — M25642 Stiffness of left hand, not elsewhere classified: Secondary | ICD-10-CM | POA: Diagnosis not present

## 2018-07-23 DIAGNOSIS — M25641 Stiffness of right hand, not elsewhere classified: Secondary | ICD-10-CM | POA: Diagnosis not present

## 2018-07-23 DIAGNOSIS — M5412 Radiculopathy, cervical region: Secondary | ICD-10-CM | POA: Diagnosis not present

## 2018-07-25 ENCOUNTER — Ambulatory Visit
Payer: PPO | Attending: Student in an Organized Health Care Education/Training Program | Admitting: Student in an Organized Health Care Education/Training Program

## 2018-07-25 ENCOUNTER — Encounter: Payer: Self-pay | Admitting: Student in an Organized Health Care Education/Training Program

## 2018-07-25 ENCOUNTER — Other Ambulatory Visit: Payer: Self-pay

## 2018-07-25 DIAGNOSIS — E1149 Type 2 diabetes mellitus with other diabetic neurological complication: Secondary | ICD-10-CM | POA: Diagnosis not present

## 2018-07-25 DIAGNOSIS — M797 Fibromyalgia: Secondary | ICD-10-CM

## 2018-07-25 DIAGNOSIS — G894 Chronic pain syndrome: Secondary | ICD-10-CM

## 2018-08-02 ENCOUNTER — Other Ambulatory Visit: Payer: Self-pay

## 2018-08-02 ENCOUNTER — Ambulatory Visit (INDEPENDENT_AMBULATORY_CARE_PROVIDER_SITE_OTHER): Payer: PPO | Admitting: Family Medicine

## 2018-08-02 ENCOUNTER — Encounter: Payer: Self-pay | Admitting: Family Medicine

## 2018-08-02 DIAGNOSIS — L304 Erythema intertrigo: Secondary | ICD-10-CM

## 2018-08-02 MED ORDER — NYSTATIN 100000 UNIT/GM EX CREA: 1.0000 "application " | TOPICAL_CREAM | Freq: Two times a day (BID) | CUTANEOUS | 0 refills | Status: DC

## 2018-08-02 MED ORDER — FLUCONAZOLE 150 MG PO TABS: 150.0000 mg | ORAL_TABLET | Freq: Once | ORAL | 0 refills | Status: AC

## 2018-08-02 NOTE — Progress Notes (Signed)
Virtual Visit via Video Note  I connected with [name] on 08/02/18 at  2:00 PM EDT by a video enabled telemedicine application and verified that I am speaking with the correct person using two identifiers.  Location patient: home Location provider:work or home office Persons participating in the virtual visit: patient, provider, Carlyle Basques- pt's daughter  I discussed the limitations of evaluation and management by telemedicine and the availability of in person appointments. The patient expressed understanding and agreed to proceed.   HPI: Pt is a 78 yo female with pmh sig for fibromyalgia, OA, depression, anxiety, HTN, DM II.  Pt s/p a cervical laminectomy and posterior decompression 01/2018.   Pt developed a pruritic rash under her breasts x a few months after being in a NH for rehab.  Area is erythematous with increased warmth.  Per pt's daughter she was advised by Derm to use zinc oxide 20 % which helped, but then stopped working.  Pt has also tried neosporin, cortisone and hydrogen peroxide which helped some.  Pt states when she was able to wear a bra she did not have these issues.  Pt currently unable to fasten a bra 2/2 contracture and limited ROM in fingers.  Pt has a caregiver that helps her.   ROS: See pertinent positives and negatives per HPI.  Past Medical History:  Diagnosis Date  . Anemia    during 1st pregnancy  . Anxiety   . Arthritis   . Complication of anesthesia   . Depression   . Diarrhea, functional   . Dysrhythmia    Paroysmal Atrial Tachycardia  . Essential hypertension   . Fibromyalgia   . GERD (gastroesophageal reflux disease)   . H/O acute pancreatitis   . Headache   . History of hiatal hernia   . Hyperlipemia   . Osteoporosis   . PAT (paroxysmal atrial tachycardia) ([place])   . Pneumonia   . PONV (postoperative nausea and vomiting)   . PSVT (paroxysmal supraventricular tachycardia) ([place])   . Reflux   . Restless leg syndrome   . Type 2  diabetes mellitus (HCC)    Type II  . Vertigo     Past Surgical History:  Procedure Laterality Date  . ABDOMINAL HYSTERECTOMY    . ANKLE SURGERY    . ANTERIOR CERVICAL DECOMP/DISCECTOMY FUSION N/A 11/12/2017   Procedure: Cervical seven to Thoracic one  Anterior cervical decompression/discectomy/fusion with removal of old plate;  Surgeon: [name], MD;  Location: [facility];  Service: Neurosurgery;  Laterality: N/A;  . BACK SURGERY     T12-L1 fusion, Anterior Cervical fusion  . CARDIAC CATHETERIZATION N/A 02/16/2015   Procedure: Left Heart Cath and Coronary Angiography;  Surgeon: Sherren Mocha, MD;  Location: [facility];  Service: Cardiovascular;  Laterality: N/A;  . CHOLECYSTECTOMY    . COLONOSCOPY    . ESOPHAGOGASTRODUODENOSCOPY  12/21/2010   Procedure: ESOPHAGOGASTRODUODENOSCOPY (EGD);  Surgeon: Landry Dyke, MD;  Location: [facility];  Service: Endoscopy;  Laterality: N/A;  . EXCISION MORTON'S NEUROMA    . EYE SURGERY Bilateral    Cataract  . FRACTURE SURGERY     ankle x 2   . KNEE SURGERY     x 3  . NECK SURGERY    . POSTERIOR CERVICAL FUSION/FORAMINOTOMY N/A 01/22/2018   Procedure: Cervical six-seven Posterior Decompression with Posterior Fixation Cervical Five to Thoracic Two;  Surgeon: [name], MD;  Location: [facility];  Service: Neurosurgery;  Laterality: N/A;  Cervical 6-7 Posteror decompression with  posterior fixation Cervical 5 to Thoracic 2  . ROTATOR CUFF REPAIR    . TONSILLECTOMY    . TOTAL KNEE ARTHROPLASTY Left 04/10/2017   Procedure: LEFT TOTAL KNEE ARTHROPLASTY;  Surgeon: Paralee Cancel, MD;  Location[facility];  Service: Orthopedics;  Laterality: Left;  70 mins  . UPPER GASTROINTESTINAL ENDOSCOPY    . WRIST SURGERY      Family History  Problem Relation Age of Onset  . Heart failure Mother   . Hyperlipidemia Mother   . Lung cancer Father        Lymphoma  . Hyperlipidemia Maternal Grandmother   . Peripheral vascular disease Maternal Grandmother       Current Outpatient Medications:  .  acetaminophen (TYLENOL) 325 MG tablet, Take 325 mg by mouth. 1 tablet at 8 am and 1 tablet prn, Disp: , Rfl:  .  aspirin EC 81 MG tablet, Take 81 mg by mouth every evening., Disp: , Rfl:  .  Calcium Carb-Cholecalciferol (CALCIUM 600+D3) 600-200 MG-UNIT TABS, Take 1 tablet by mouth every evening. Calcium 600 mg -Vitamin D 200 mg, Disp: , Rfl:  .  cholecalciferol (VITAMIN D) 1000 units tablet, Take 1,000 Units by mouth daily. Daily in the am, Disp: , Rfl:  .  Continuous Blood Gluc Receiver (DEXCOM G6 RECEIVER) DEVI, 1 Device by Does not apply route daily., Disp: 1 Device, Rfl: 0 .  Continuous Blood Gluc Sensor (DEXCOM G6 SENSOR) MISC, 1 kit by Does not apply route as directed., Disp: 3 each, Rfl: 3 .  Continuous Blood Gluc Transmit (DEXCOM G6 TRANSMITTER) MISC, 1 kit by Does not apply route as directed., Disp: 1 each, Rfl: 3 .  Cranberry 450 MG CAPS, Take 2 capsules in AM, Disp: , Rfl:  .  digoxin (LANOXIN) 0.125 MG tablet, Take 1 tablet (0.125 mg total) by mouth daily. Patient needs appointment for further refills, Disp: 90 tablet, Rfl: 3 .  DULoxetine (CYMBALTA) 30 MG capsule, Take 1 capsule (30 mg total) by mouth daily., Disp: 30 capsule, Rfl: 1 .  ezetimibe (ZETIA) 10 MG tablet, Take 10 mg by mouth daily., Disp: , Rfl:  .  glucose blood test strip, Use as instructed to test blood sugars 3 times daily DX.E11.42, Disp: 100 each, Rfl: 12 .  Insulin Aspart (NOVOLOG [place]), Inject 8 Units into the skin 3 (three) times daily before meals., Disp: , Rfl:  .  insulin glargine (LANTUS) 100 UNIT/ML injection, Inject 20 Units into the skin daily. Daily in the am , Disp: , Rfl:  .  Insulin Pen Needle (BD PEN NEEDLE MICRO U/F) 32G X 6 MM MISC, 4 times a day novolog(3) and lantus9(1), Disp: 100 each, Rfl: 6 .  meloxicam (MOBIC) 15 MG tablet, Take 1 tablet (15 mg total) by mouth daily., Disp: 30 tablet, Rfl: 1 .  metoprolol succinate (TOPROL-XL) 25 MG 24 hr tablet, Take 1  tablet (25 mg total) by mouth daily. Patient needs to schedule an appointment for an additional refill, Disp: 90 tablet, Rfl: 3 .  Multiple Vitamins-Minerals (PRESERVISION AREDS 2 PO), Take 2 tablets by mouth daily., Disp: , Rfl:  .  nitroGLYCERIN (NITROSTAT) 0.4 MG SL tablet, Place 0.4 mg under the tongue every 5 (five) minutes as needed for chest pain. , Disp: , Rfl:  .  omeprazole (PRILOSEC) 20 MG capsule, Take 1 capsule (20 mg total) by mouth daily., Disp: 90 capsule, Rfl: 2 .  ondansetron (ZOFRAN) 4 MG tablet, Take 1 tablet (4 mg total) by mouth every 8 (  eight) hours as needed for nausea or vomiting., Disp: 20 tablet, Rfl: 0 .  ramipril (ALTACE) 10 MG capsule, TAKE 1 CAPSULE (10 MG TOTAL) BY MOUTH EVERY EVENING., Disp: 90 capsule, Rfl: 0 .  RELION INSULIN SYRINGE 1ML/31G 31G X 5/16" 1 ML MISC, Use as directed, Disp: 100 each, Rfl: 1 .  rOPINIRole (REQUIP) 1 MG tablet, TAKE ONE TABLET BY MOUTH AT BEDTIME., Disp: 90 tablet, Rfl: 1 .  tamsulosin (FLOMAX) 0.4 MG CAPS capsule, Take 1 capsule (0.4 mg total) by mouth daily., Disp: 90 capsule, Rfl: 0 .  TURMERIC PO, Take 500 mg by mouth 2 (two) times daily. , Disp: , Rfl:  .  verapamil (CALAN) 120 MG tablet, Take 120 mg by mouth daily as needed (afib). , Disp: , Rfl:  .  verapamil (VERELAN PM) 240 MG 24 hr capsule, Take 1 capsule (240 mg total) by mouth daily., Disp: 90 capsule, Rfl: 3  EXAM:  VITALS per patient if applicable:  RR between 12-20 bpm  GENERAL: alert, oriented, appears well and in no acute distress  HEENT: atraumatic, conjunctiva clear, no obvious abnormalities on inspection of external nose and ears  NECK: normal movements of the head and neck  LUNGS: on inspection no signs of respiratory distress, breathing rate appears normal, no obvious gross SOB, gasping or wheezing  CV: no obvious cyanosis  MS: moves all visible extremities without noticeable abnormality.  Contracture of hand.  SKIN: Skin underneath L breast with an  erythematous plaque.  Skin inferior to R breast with smaller, similar  lesion with white residue from Zinc oxide present.    PSYCH/NEURO: pleasant and cooperative, no obvious depression or anxiety, speech and thought processing grossly intact  ASSESSMENT AND PLAN:  Discussed the following assessment and plan:  Intertrigo -also consider erysipelas -good diabetic control encouraged. -discussed ways to keep areas clean and dry. -pt encouraged to refrain from scratching to prevent secondary bacterial infection - Plan: nystatin cream (MYCOSTATIN), fluconazole (DIFLUCAN) 150 MG tablet -given precautions  F/u in 1 wk, sooner if needed.  I discussed the assessment and treatment plan with the patient. The patient was provided an opportunity to ask questions and all were answered. The patient agreed with the plan and demonstrated an understanding of the instructions.   The patient was advised to call back or seek an in-person evaluation if the symptoms worsen or if the condition fails to improve as anticipated.   Billie Ruddy, MD

## 2018-08-15 ENCOUNTER — Other Ambulatory Visit: Payer: Self-pay | Admitting: Family Medicine

## 2018-08-23 ENCOUNTER — Other Ambulatory Visit: Payer: Self-pay | Admitting: Family Medicine

## 2018-08-23 ENCOUNTER — Ambulatory Visit (INDEPENDENT_AMBULATORY_CARE_PROVIDER_SITE_OTHER): Payer: PPO | Admitting: Family Medicine

## 2018-08-23 ENCOUNTER — Other Ambulatory Visit: Payer: Self-pay

## 2018-08-23 ENCOUNTER — Encounter: Payer: Self-pay | Admitting: Family Medicine

## 2018-08-23 VITALS — BP 122/70 | HR 78 | Temp 98.5°F | Resp 16 | Ht 64.0 in | Wt 182.0 lb

## 2018-08-23 DIAGNOSIS — H6121 Impacted cerumen, right ear: Secondary | ICD-10-CM | POA: Diagnosis not present

## 2018-08-23 DIAGNOSIS — I10 Essential (primary) hypertension: Secondary | ICD-10-CM | POA: Diagnosis not present

## 2018-08-23 DIAGNOSIS — E1140 Type 2 diabetes mellitus with diabetic neuropathy, unspecified: Secondary | ICD-10-CM | POA: Diagnosis not present

## 2018-08-23 DIAGNOSIS — Z0000 Encounter for general adult medical examination without abnormal findings: Secondary | ICD-10-CM

## 2018-08-23 DIAGNOSIS — Z794 Long term (current) use of insulin: Secondary | ICD-10-CM

## 2018-08-23 DIAGNOSIS — M797 Fibromyalgia: Secondary | ICD-10-CM | POA: Diagnosis not present

## 2018-08-23 DIAGNOSIS — E559 Vitamin D deficiency, unspecified: Secondary | ICD-10-CM

## 2018-08-23 DIAGNOSIS — L304 Erythema intertrigo: Secondary | ICD-10-CM | POA: Diagnosis not present

## 2018-08-23 LAB — BASIC METABOLIC PANEL
BUN: 26 mg/dL — ABNORMAL HIGH (ref 6–23)
CO2: 23 mEq/L (ref 19–32)
Calcium: 9.3 mg/dL (ref 8.4–10.5)
Chloride: 105 mEq/L (ref 96–112)
Creatinine, Ser: 0.94 mg/dL (ref 0.40–1.20)
GFR: 57.9 mL/min — ABNORMAL LOW (ref >60.00)
Glucose, Bld: 101 mg/dL — ABNORMAL HIGH (ref 70–99)
Potassium: 4.3 mEq/L (ref 3.5–5.1)
Sodium: 138 mEq/L (ref 135–145)

## 2018-08-23 LAB — VITAMIN D 25 HYDROXY (VIT D DEFICIENCY, FRACTURES): VITD: 62.77 ng/mL (ref 30.00–100.00)

## 2018-08-23 MED ORDER — DULOXETINE HCL 30 MG PO CPEP: 30.0000 mg | ORAL_CAPSULE | Freq: Every day | ORAL | 2 refills | Status: DC

## 2018-08-23 MED ORDER — RAMIPRIL 10 MG PO CAPS: 10.0000 mg | ORAL_CAPSULE | Freq: Every evening | ORAL | 2 refills | Status: DC

## 2018-08-23 MED ORDER — NYSTATIN 100000 UNIT/GM EX POWD: Freq: Three times a day (TID) | CUTANEOUS | 6 refills | Status: DC | PRN

## 2018-08-23 MED ORDER — NYSTATIN-TRIAMCINOLONE 100000-0.1 UNIT/GM-% EX CREA: 1.0000 "application " | TOPICAL_CREAM | Freq: Two times a day (BID) | CUTANEOUS | 0 refills | Status: DC | PRN

## 2018-08-23 NOTE — Assessment & Plan Note (Signed)
No changes in current management, will follow labs done today and will give further recommendations accordingly.  

## 2018-08-23 NOTE — Assessment & Plan Note (Signed)
Adequately controlled. No changes in current management. DASH diet recommended. Eye exam recommended annually. F/U in 6 months, before if needed.

## 2018-08-23 NOTE — Assessment & Plan Note (Signed)
Myalgias greatly improved with Cymbalta 30 mg daily,so no changes. Good sleep hygiene,fall precautions,and low impact exercises are recommended. F/U in 5 months.

## 2018-08-23 NOTE — Patient Instructions (Addendum)
[name] , Thank you for taking time to come for your Medicare Wellness Visit. I appreciate your ongoing commitment to your health goals. Please review the following plan we discussed and let me know if I can assist you in the future.   These are the goals we discussed: Goals    . Patient Stated     To feel better        This is a list of the screening recommended for you and due dates:  Health Maintenance  Topic Date Due  . Complete foot exam   04/09/2018  . Tetanus Vaccine  09/06/2018*  . Flu Shot  09/14/2018  . Eye exam for diabetics  09/18/2018  . Hemoglobin A1C  12/22/2018  . DEXA scan (bone density measurement)  Completed  . Pneumonia vaccines  Completed  *Topic was postponed. The date shown is not the original due date.    A few things to remember from today's visit:   Essential hypertension, benign - Plan: Basic metabolic panel  Vitamin D deficiency  Medicare annual wellness visit, subsequent  Intertrigo - Plan: nystatin (MYCOSTATIN/NYSTOP) powder, nystatin-triamcinolone (MYCOLOG II) cream   Please be sure medication list is accurate. If a new problem present, please set up appointment sooner than planned today.         No changes today.  Intertrigo Intertrigo is skin irritation (inflammation) that happens in warm, moist areas of the body. The irritation can cause a rash and make skin raw and itchy. The rash is usually pink or red. It happens mostly between folds of skin or where skin rubs together, such as:  Between the toes.  In the armpits.  In the groin area.  Under the belly.  Under the breasts.  Around the butt area. This condition is not passed from person to person (is not contagious). What are the causes?  Heat, moisture, rubbing, and not enough air movement.  The condition can be made worse by: ? Sweat. ? Bacteria. ? A fungus, such as yeast. What increases the risk?  Moisture in your skin folds.  You are more likely to  develop this condition if you: ? Have diabetes. ? Are overweight. ? Are not able to move around. ? Live in a warm and moist climate. ? Wear splints, braces, or other medical devices. ? Are not able to control your pee (urine) or poop (stool). What are the signs or symptoms?  A pink or red skin rash in the skin fold or near the skin fold.  Raw or scaly skin.  Itching.  A burning feeling.  Bleeding.  Leaking fluid.  A bad smell. How is this treated?  Cleaning and drying your skin.  Taking an antibiotic medicine or using an antibiotic skin cream for a bacterial infection.  Using an antifungal cream on your skin or taking pills for an infection that was caused by a fungus, such as yeast.  Using a steroid ointment to stop the itching and irritation.  Separating the skin fold with a clean cotton cloth to absorb moisture and allow air to flow into the area. Follow these instructions at home:  Keep the affected area clean and dry.  Do not scratch your skin.  Stay cool as much as you can. Use an air conditioner or a fan, if you have one.  Apply over-the-counter and prescription medicines only as told by your doctor.  If you were prescribed an antibiotic medicine, use it as told by your doctor. Do not stop  using the antibiotic even if your condition starts to get better.  Keep all follow-up visits as told by your doctor. This is important. How is this prevented?   Stay at a healthy weight.  Take care of your feet. This is very important if you have diabetes. You should: ? Wear shoes that fit well. ? Keep your feet dry. ? Wear clean cotton or wool socks.  Protect the skin in your groin and butt area as told by your doctor. To do this: ? Follow a regular cleaning routine. ? Use creams, powders, or ointments that protect your skin. ? Change protection pads often.  Do not wear tight clothes. Wear clothes that: ? Are loose. ? Take moisture away from your body. ? Are  made of cotton.  Wear a bra that gives good support, if needed.  Shower and dry yourself well after being active. Use a hair dryer on a cool setting to dry between skin folds.  Keep your blood sugar under control if you have diabetes. Contact a doctor if:  Your symptoms do not get better with treatment.  Your symptoms get worse or they spread.  You notice more redness and warmth.  You have a fever. Summary  Intertrigo is skin irritation that occurs when folds of skin rub together.  This condition is caused by heat, moisture, and rubbing.  This condition may be treated by cleaning and drying your skin and with medicines.  Apply over-the-counter and prescription medicines only as told by your doctor.  Keep all follow-up visits as told by your doctor. This is important. This information is not intended to replace advice given to you by your health care provider. Make sure you discuss any questions you have with your health care provider. Document Released: 03/04/2010 Document Revised: 11/08/2017 Document Reviewed: 11/08/2017 Elsevier Patient Education  2020 [employer].  Please be sure medication list is accurate. If a new problem present, please set up appointment sooner than planned today.

## 2018-08-23 NOTE — Progress Notes (Signed)
HPI:   [name] is a 78 y.o. female, who is here today to follow on chronic medical problems and CPE.  She was last seen on 06/28/18, Cymbalta 30 mg was added to treat fibromyalgia. She has tolerated mediation well and it has helped tremendously with pain, now pain is "tolerable." She cancelled appt with pain clinic because Cymbalta was helping. She is also taking Mobic 15 mg daily as needed, it does help with pain. No side effects.  For a few days she has had hearigng loss intermittent, R>L,little better with peroxide. No earache or drainage. No travel or recent URI. She has had problem since last year,intermitently. Exacerbated by heat/sweating.  Also c/o very pruritic rash under breast. She has used Nystatin cream, which has helped some.  Last CPE in 2016.  She lives alone. Daughter lives close by.  She needs assistance with ADL's and some IADL's. She has an aid to help with ADL's, daily from 8 am to 3 pm. She also drives her to buy groceries and to run errands. Her daughter bought her a bidet and installed on her bathroom,helps with cleaning after defecation.  PT has helped with mobility. 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 She has not been consistent with following a healthful diet and does not exercise regularly.  DM II,concerned about elevated BS's, < 200. She is eating cookies again, 2 per day. She is on Lantus 20 U and Novolog * U tid. No hypoglycemic events. She follows with endocrinologist, appt in a few weeks. Last HgA1C 5.9 in 06/2018.  HTN, she is on Verapamil 240 mg daily and Verapamil 120 mg daily prn, Metoprolol Succinate 25 mg daily,Ramipril 10 mg daily. Negative for unusual headache,CP,SOB,palpitations,or edema.   HLD, she is on Zetia 10 mg daily. Lab Results  Component Value Date   CHOL 151 01/20/2016   HDL 27 (A) 01/20/2016   LDLCALC 82 01/20/2016   TRIG 438 (A) 01/20/2016   CHOLHDL 5.9 02/15/2015   Vit D  deficiency, she is on OTC vit D 1000 U daily. Last 25 OH vit D was over a year ago.  Review of Systems  Constitutional: Negative for chills and fever.  HENT: Positive for tinnitus. Negative for mouth sores, nosebleeds and sore throat.   Eyes: Negative for visual disturbance.  Endocrine: Negative for polydipsia, polyphagia and polyuria.  Genitourinary: Negative for decreased urine volume, dysuria and hematuria.  Musculoskeletal: Positive for arthralgias, gait problem and myalgias.  Neurological: Negative for tremors and facial asymmetry.  Psychiatric/Behavioral: The patient is nervous/anxious.   Rest see pertinent positives and negatives per HPI.   Current Outpatient Medications on File Prior to Visit  Medication Sig Dispense Refill  . acetaminophen (TYLENOL) 325 MG tablet Take 325 mg by mouth. 1 tablet at 8 am and 1 tablet prn    . aspirin EC 81 MG tablet Take 81 mg by mouth every evening.    . Calcium Carb-Cholecalciferol (CALCIUM 600+D3) 600-200 MG-UNIT TABS Take 1 tablet by mouth every evening. Calcium 600 mg -Vitamin D 200 mg    . cholecalciferol (VITAMIN D) 1000 units tablet Take 1,000 Units by mouth daily. Daily in the am    . Continuous Blood Gluc Receiver (DEXCOM G6 RECEIVER) DEVI 1 Device by Does not apply route daily. 1 Device 0  . Continuous Blood Gluc Sensor (DEXCOM G6 SENSOR) MISC 1 kit by Does not apply route as directed. 3 each 3  . Continuous Blood  Gluc Transmit (DEXCOM G6 TRANSMITTER) MISC 1 kit by Does not apply route as directed. 1 each 3  . Cranberry 450 MG CAPS Take 2 capsules in AM    . digoxin (LANOXIN) 0.125 MG tablet Take 1 tablet (0.125 mg total) by mouth daily. Patient needs appointment for further refills 90 tablet 3  . glucose blood test strip Use as instructed to test blood sugars 3 times daily DX.E11.42 100 each 12  . Insulin Aspart (NOVOLOG [place]) Inject 8 Units into the skin 3 (three) times daily before meals.    . insulin glargine (LANTUS) 100 UNIT/ML  injection Inject 20 Units into the skin daily. Daily in the am     . Insulin Pen Needle (BD PEN NEEDLE MICRO U/F) 32G X 6 MM MISC 4 times a day novolog(3) and lantus9(1) 100 each 6  . metoprolol succinate (TOPROL-XL) 25 MG 24 hr tablet Take 1 tablet (25 mg total) by mouth daily. Patient needs to schedule an appointment for an additional refill 90 tablet 3  . Multiple Vitamins-Minerals (PRESERVISION AREDS 2 PO) Take 2 tablets by mouth daily.    . nitroGLYCERIN (NITROSTAT) 0.4 MG SL tablet Place 0.4 mg under the tongue every 5 (five) minutes as needed for chest pain.     Marland Kitchen omeprazole (PRILOSEC) 20 MG capsule Take 1 capsule (20 mg total) by mouth daily. 90 capsule 2  . ondansetron (ZOFRAN) 4 MG tablet Take 1 tablet (4 mg total) by mouth every 8 (eight) hours as needed for nausea or vomiting. 20 tablet 0  . RELION INSULIN SYRINGE 1ML/31G 31G X 5/16" 1 ML MISC Use as directed 100 each 1  . rOPINIRole (REQUIP) 1 MG tablet TAKE ONE TABLET BY MOUTH AT BEDTIME. 90 tablet 1  . tamsulosin (FLOMAX) 0.4 MG CAPS capsule Take 1 capsule (0.4 mg total) by mouth daily. 90 capsule 0  . TURMERIC PO Take 500 mg by mouth 2 (two) times daily.     . verapamil (CALAN) 120 MG tablet Take 120 mg by mouth daily as needed (afib).     . verapamil (VERELAN PM) 240 MG 24 hr capsule Take 1 capsule (240 mg total) by mouth daily. 90 capsule 3   No current facility-administered medications on file prior to visit.      Past Medical History:  Diagnosis Date  . Anemia    during 1st pregnancy  . Anxiety   . Arthritis   . Complication of anesthesia   . Depression   . Diarrhea, functional   . Dysrhythmia    Paroysmal Atrial Tachycardia  . Essential hypertension   . Fibromyalgia   . GERD (gastroesophageal reflux disease)   . H/O acute pancreatitis   . Headache   . History of hiatal hernia   . Hyperlipemia   . Osteoporosis   . PAT (paroxysmal atrial tachycardia) ([place])   . Pneumonia   . PONV (postoperative nausea and  vomiting)   . PSVT (paroxysmal supraventricular tachycardia) ([place])   . Reflux   . Restless leg syndrome   . Type 2 diabetes mellitus (HCC)    Type II  . Vertigo    Allergies  Allergen Reactions  . Amoxil [Amoxicillin] Swelling and Other (See Comments)    Lips Has patient had a PCN reaction causing immediate rash, facial/tongue/throat swelling, SOB or lightheadedness with hypotension:YES Has patient had a PCN reaction causing severe rash involving mucus membranes or skin necrosis:No Has patient had a PCN reaction that required hospitalization:No Has patient had a PCN  reaction occurring within the last 10 years:Yes. If all of the above answers are "NO", then may proceed with Cephalosporin use.   . Chocolate Hives  . Demerol Nausea And Vomiting  . Lisinopril Itching and Swelling  . Other     Raw food or nuts - GI pain    Social History   Socioeconomic History  . Marital status: Widowed    Spouse name: Glendell Docker  . Number of children: 2  . Years of education: Not on file  . Highest education level: Not on file  Occupational History  . Not on file  Social Needs  . Financial resource strain: Not on file  . Food insecurity    Worry: Not on file    Inability: Not on file  . Transportation needs    Medical: Not on file    Non-medical: Not on file  Tobacco Use  . Smoking status: Former Research scientist (life sciences)  . Smokeless tobacco: Never Used  Substance and Sexual Activity  . Alcohol use: No  . Drug use: No  . Sexual activity: Not on file  Lifestyle  . Physical activity    Days per week: Not on file    Minutes per session: Not on file  . Stress: Not on file  Relationships  . Social Herbalist on phone: Not on file    Gets together: Not on file    Attends religious service: Not on file    Active member of club or organization: Not on file    Attends meetings of clubs or organizations: Not on file    Relationship status: Not on file  Other Topics Concern  . Not on file  Social  History Narrative   Lives home alone.  Daughter [name] with her today.   Independent of ADLs.  Education HS.  Widowed.      Vitals:   08/23/18 1206  BP: 122/70  Pulse: 78  Resp: 16  Temp: 98.5 F (36.9 C)  SpO2: 97%   Body mass index is 31.24 kg/m.   Physical Exam  Nursing note and vitals reviewed. Constitutional: She is oriented to person, place, and time. She appears well-developed. No distress.  HENT:  Head: Normocephalic and atraumatic.  Mouth/Throat: Oropharynx is clear and moist and mucous membranes are normal.  Cerumen excess R>L. Left TM partially seen.  Eyes: Pupils are equal, round, and reactive to light. Conjunctivae are normal.  Cardiovascular: Normal rate and regular rhythm.  No murmur heard. Pulses:      Dorsalis pedis pulses are 2+ on the right side and 2+ on the left side.  Respiratory: Effort normal and breath sounds normal. No respiratory distress.  GI: Soft. She exhibits no mass. There is no hepatomegaly. There is no abdominal tenderness.  Musculoskeletal:        General: No edema.     Comments: IP contractures both hands, R>L. No signs of synovitis.  Lymphadenopathy:    She has no cervical adenopathy.  Neurological: She is alert and oriented to person, place, and time. She has normal strength. No cranial nerve deficit. Gait (assisted with a walker) abnormal.  Skin: Skin is warm. No rash noted. No erythema.  Psychiatric: Her mood appears anxious.  Well groomed, good eye contact.    ASSESSMENT AND PLAN:  [name] was seen today for follow-up and medicare wellness.  Diagnoses and all orders for this visit:  Orders Placed This Encounter  Procedures  . Basic metabolic panel  . VITAMIN D 25  Hydroxy (Vit-D Deficiency, Fractures)   Lab Results  Component Value Date   CREATININE 0.94 08/23/2018   BUN 26 (H) 08/23/2018   NA 138 08/23/2018   K 4.3 08/23/2018   CL 105 08/23/2018   CO2 23 08/23/2018    Routine physical examination We  discussed the importance of regular physical activity as tolerated and healthy diet for prevention of chronic illness and/or complications. Preventive guidelines reviewed. Vaccination up to date. Ca++ and vit D supplementation to continue. Next CPE in a year.  Intertrigo Improved some. Mycolog bid topical as needed. Nystatin powder daily tid. Keep area dry and monitor for signs of infection. Educated about prognosis. F/U as needed.  -     nystatin (MYCOSTATIN/NYSTOP) powder; Apply topically 3 (three) times daily as needed. -     nystatin-triamcinolone (MYCOLOG II) cream; Apply 1 application topically 2 (two) times daily as needed.  Hearing loss of right ear due to cerumen impaction Improved after right ear lavage. Avoid q tips. OTC Debrox can be used as needed.  Controlled type 2 diabetes mellitus with diabetic neuropathy, with long-term current use of insulin (HCC) Last HgA1C < 7.0. No changes in current management. Educated about importance of following dietary recommendations. Keep appt with endocrinologist.   Fibromyalgia Myalgias greatly improved with Cymbalta 30 mg daily,so no changes. Good sleep hygiene,fall precautions,and low impact exercises are recommended. F/U in 5 months.  Essential hypertension, benign Adequately controlled. No changes in current management. DASH diet recommended. Eye exam recommended annually. F/U in 6 months, before if needed.   Vitamin D deficiency No changes in current management, will follow labs done today and will give further recommendations accordingly.   Return in about 5 months (around 01/23/2019) for f/u.     Shelton Soler G. [name], MD  [facility] [place] office.

## 2018-08-25 ENCOUNTER — Encounter: Payer: Self-pay | Admitting: Family Medicine

## 2018-08-25 MED ORDER — MELOXICAM 15 MG PO TABS: 15.0000 mg | ORAL_TABLET | Freq: Every day | ORAL | 1 refills | Status: DC

## 2018-08-25 MED ORDER — RAMIPRIL 10 MG PO CAPS: 10.0000 mg | ORAL_CAPSULE | Freq: Every evening | ORAL | 2 refills | Status: DC

## 2018-08-26 DIAGNOSIS — E1149 Type 2 diabetes mellitus with other diabetic neurological complication: Secondary | ICD-10-CM | POA: Diagnosis not present

## 2018-09-03 ENCOUNTER — Telehealth: Payer: Self-pay | Admitting: Family Medicine

## 2018-09-03 ENCOUNTER — Ambulatory Visit (INDEPENDENT_AMBULATORY_CARE_PROVIDER_SITE_OTHER): Payer: PPO | Admitting: Family Medicine

## 2018-09-03 ENCOUNTER — Encounter: Payer: Self-pay | Admitting: Family Medicine

## 2018-09-03 ENCOUNTER — Other Ambulatory Visit: Payer: Self-pay | Admitting: Family Medicine

## 2018-09-03 ENCOUNTER — Other Ambulatory Visit: Payer: Self-pay

## 2018-09-03 DIAGNOSIS — B372 Candidiasis of skin and nail: Secondary | ICD-10-CM

## 2018-09-03 MED ORDER — FLUCONAZOLE 150 MG PO TABS: ORAL_TABLET | ORAL | 0 refills | Status: DC

## 2018-09-03 NOTE — Progress Notes (Signed)
Virtual Visit via Video Note  I connected with [name]  on 09/03/18 at  4:20 PM EDT by a video enabled telemedicine application and verified that I am speaking with the correct person using two identifiers.  Location patient: home Location provider:work or home office Persons participating in the virtual visit: patient, provider   HPI:  Acute visit for Rash: -under both breasts for several months, itchy -seen in person by PCP 08/23/2018 and dx intertrigo with mycolog and nystatin powder provided -this is getting much better, but is not resolved -they are tired of dealing with it   ROS: See pertinent positives and negatives per HPI.  Past Medical History:  Diagnosis Date  . Anemia    during 1st pregnancy  . Anxiety   . Arthritis   . Complication of anesthesia   . Depression   . Diarrhea, functional   . Dysrhythmia    Paroysmal Atrial Tachycardia  . Essential hypertension   . Fibromyalgia   . GERD (gastroesophageal reflux disease)   . H/O acute pancreatitis   . Headache   . History of hiatal hernia   . Hyperlipemia   . Osteoporosis   . PAT (paroxysmal atrial tachycardia) ([place])   . Pneumonia   . PONV (postoperative nausea and vomiting)   . PSVT (paroxysmal supraventricular tachycardia) ([place])   . Reflux   . Restless leg syndrome   . Type 2 diabetes mellitus (HCC)    Type II  . Vertigo     Past Surgical History:  Procedure Laterality Date  . ABDOMINAL HYSTERECTOMY    . ANKLE SURGERY    . ANTERIOR CERVICAL DECOMP/DISCECTOMY FUSION N/A 11/12/2017   Procedure: Cervical seven to Thoracic one  Anterior cervical decompression/discectomy/fusion with removal of old plate;  Surgeon: [name], MD;  Location: [place];  Service: Neurosurgery;  Laterality: N/A;  . BACK SURGERY     T12-L1 fusion, Anterior Cervical fusion  . CARDIAC CATHETERIZATION N/A 02/16/2015   Procedure: Left Heart Cath and Coronary Angiography;  Surgeon: Sherren Mocha, MD;  Location: [place] [facility];   Service: Cardiovascular;  Laterality: N/A;  . CHOLECYSTECTOMY    . COLONOSCOPY    . ESOPHAGOGASTRODUODENOSCOPY  12/21/2010   Procedure: ESOPHAGOGASTRODUODENOSCOPY (EGD);  Surgeon: Landry Dyke, MD;  Location: [facility];  Service: Endoscopy;  Laterality: N/A;  . EXCISION MORTON'S NEUROMA    . EYE SURGERY Bilateral    Cataract  . FRACTURE SURGERY     ankle x 2   . KNEE SURGERY     x 3  . NECK SURGERY    . POSTERIOR CERVICAL FUSION/FORAMINOTOMY N/A 01/22/2018   Procedure: Cervical six-seven Posterior Decompression with Posterior Fixation Cervical Five to Thoracic Two;  Surgeon: [name], MD;  Location: [place];  Service: Neurosurgery;  Laterality: N/A;  Cervical 6-7 Posteror decompression with posterior fixation Cervical 5 to Thoracic 2  . ROTATOR CUFF REPAIR    . TONSILLECTOMY    . TOTAL KNEE ARTHROPLASTY Left 04/10/2017   Procedure: LEFT TOTAL KNEE ARTHROPLASTY;  Surgeon: Paralee Cancel, MD;  Location: [facility];  Service: Orthopedics;  Laterality: Left;  70 mins  . UPPER GASTROINTESTINAL ENDOSCOPY    . WRIST SURGERY      Family History  Problem Relation Age of Onset  . Heart failure Mother   . Hyperlipidemia Mother   . Lung cancer Father        Lymphoma  . Hyperlipidemia Maternal Grandmother   . Peripheral vascular disease Maternal Grandmother  SOCIAL HX: see hpi   Current Outpatient Medications:  .  acetaminophen (TYLENOL) 325 MG tablet, Take 325 mg by mouth at bedtime. , Disp: , Rfl:  .  aspirin EC 81 MG tablet, Take 81 mg by mouth every evening., Disp: , Rfl:  .  Calcium Carb-Cholecalciferol (CALCIUM 600+D3) 600-200 MG-UNIT TABS, Take 1 tablet by mouth every evening. Calcium 600 mg -Vitamin D 200 mg, Disp: , Rfl:  .  cholecalciferol (VITAMIN D) 1000 units tablet, Take 10,000 Units by mouth daily. Daily in the am, Disp: , Rfl:  .  Continuous Blood Gluc Receiver (DEXCOM G6 RECEIVER) DEVI, 1 Device by Does not apply route daily., Disp: 1 Device, Rfl: 0 .  Continuous  Blood Gluc Sensor (DEXCOM G6 SENSOR) MISC, 1 kit by Does not apply route as directed., Disp: 3 each, Rfl: 3 .  Continuous Blood Gluc Transmit (DEXCOM G6 TRANSMITTER) MISC, 1 kit by Does not apply route as directed., Disp: 1 each, Rfl: 3 .  Cranberry 450 MG CAPS, Take 2 capsules in AM, Disp: , Rfl:  .  digoxin (LANOXIN) 0.125 MG tablet, Take 1 tablet (0.125 mg total) by mouth daily. Patient needs appointment for further refills, Disp: 90 tablet, Rfl: 3 .  DULoxetine (CYMBALTA) 30 MG capsule, Take 1 capsule (30 mg total) by mouth daily., Disp: 90 capsule, Rfl: 2 .  ezetimibe (ZETIA) 10 MG tablet, TAKE 1 TABLET BY MOUTH EVERY DAY, Disp: 90 tablet, Rfl: 1 .  glucose blood test strip, Use as instructed to test blood sugars 3 times daily DX.E11.42, Disp: 100 each, Rfl: 12 .  Insulin Aspart (NOVOLOG [place]), Inject 8 Units into the skin 3 (three) times daily before meals., Disp: , Rfl:  .  insulin glargine (LANTUS) 100 UNIT/ML injection, Inject 20 Units into the skin daily. Daily in the am , Disp: , Rfl:  .  Insulin Pen Needle (BD PEN NEEDLE MICRO U/F) 32G X 6 MM MISC, 4 times a day novolog(3) and lantus9(1), Disp: 100 each, Rfl: 6 .  meloxicam (MOBIC) 15 MG tablet, Take 1 tablet (15 mg total) by mouth daily. (Patient taking differently: Take 15 mg by mouth as needed. ), Disp: 90 tablet, Rfl: 1 .  metoprolol succinate (TOPROL-XL) 25 MG 24 hr tablet, Take 1 tablet (25 mg total) by mouth daily. Patient needs to schedule an appointment for an additional refill, Disp: 90 tablet, Rfl: 3 .  Multiple Vitamins-Minerals (PRESERVISION AREDS 2 PO), Take 2 tablets by mouth daily., Disp: , Rfl:  .  nitroGLYCERIN (NITROSTAT) 0.4 MG SL tablet, Place 0.4 mg under the tongue every 5 (five) minutes as needed for chest pain. , Disp: , Rfl:  .  nystatin (MYCOSTATIN/NYSTOP) powder, Apply topically 3 (three) times daily as needed., Disp: 15 g, Rfl: 6 .  nystatin-triamcinolone (MYCOLOG II) cream, Apply 1 application topically 2 (two)  times daily as needed., Disp: 45 g, Rfl: 0 .  omeprazole (PRILOSEC) 20 MG capsule, Take 1 capsule (20 mg total) by mouth daily., Disp: 90 capsule, Rfl: 2 .  ondansetron (ZOFRAN) 4 MG tablet, Take 1 tablet (4 mg total) by mouth every 8 (eight) hours as needed for nausea or vomiting., Disp: 20 tablet, Rfl: 0 .  ramipril (ALTACE) 10 MG capsule, Take 1 capsule (10 mg total) by mouth every evening., Disp: 90 capsule, Rfl: 2 .  RELION INSULIN SYRINGE 1ML/31G 31G X 5/16" 1 ML MISC, Use as directed, Disp: 100 each, Rfl: 1 .  rOPINIRole (REQUIP) 1 MG tablet, TAKE ONE TABLET  BY MOUTH AT BEDTIME., Disp: 90 tablet, Rfl: 1 .  tamsulosin (FLOMAX) 0.4 MG CAPS capsule, Take 1 capsule (0.4 mg total) by mouth daily., Disp: 90 capsule, Rfl: 0 .  TURMERIC PO, Take 500 mg by mouth 2 (two) times daily. , Disp: , Rfl:  .  verapamil (CALAN) 120 MG tablet, Take 120 mg by mouth daily as needed (afib). , Disp: , Rfl:  .  verapamil (VERELAN PM) 240 MG 24 hr capsule, Take 1 capsule (240 mg total) by mouth daily., Disp: 90 capsule, Rfl: 3 .  fluconazole (DIFLUCAN) 150 MG tablet, Take one tablet daily for 3 days. Repeat 1 tablet in 1 week if needed., Disp: 4 tablet, Rfl: 0  EXAM:  VITALS per patient if applicable:  GENERAL: alert, oriented, appears well and in no acute distress  HEENT: atraumatic, conjunttiva clear, no obvious abnormalities on inspection of external nose and ears  NECK: normal movements of the head and neck  LUNGS: on inspection no signs of respiratory distress, breathing rate appears normal, no obvious gross SOB, gasping or wheezing  CV: no obvious cyanosis  SKIN: daughter is present for exam and she kept shirt over most of her breast, did prefer to show me the inframammary crease area and has a sharply demarcated area of erythema with fine peripheral scale bilat  MS: moves all visible extremities without noticeable abnormality  PSYCH/NEURO: pleasant and cooperative, no obvious depression or  anxiety, speech and thought processing grossly intact  ASSESSMENT AND PLAN:  Discussed the following assessment and plan:  Candidal intertrigo   -diflucan 126m x3 days, repeat one tablet in 1 week if needed -topical clotrimazole or miconazole cream (otc) 2 times daily for 2 weeks -probiotic (align or culturelle) for 2-4 weeks -loose cotton/natural fiber clothing and no bra if able at home, stay cool, no excessive scrubbing or washin -low sugar healthy diet -follow up in 2 weeks with PCP, sooner as needed    I discussed the assessment and treatment plan with the patient. The patient was provided an opportunity to ask questions and all were answered. The patient agreed with the plan and demonstrated an understanding of the instructions.   The patient was advised to call back or seek an in-person evaluation if the symptoms worsen or if the condition fails to improve as anticipated.   Follow up instructions: Advised assistant JWendie Simmerto help patient arrange the following: -follow up with PCP or me in 2 weeks  HLucretia Kern DO   Patient Instructions  -diflucan 1553mx3 days, repeat one tablet in 1 week if needed -topical clotrimazole or miconazole cream (available over the counter) 2 times daily for 2 weeks -probiotic (align or culturelle) for 2-4 weeks -loose cotton/natural fiber clothing and no bra if able at home, stay cool, no excessive scrubbing or washing -low sugar healthy diet -follow up in 2 weeks with PCP, sooner as needed

## 2018-09-03 NOTE — Patient Instructions (Signed)
-  diflucan 150mg  x3 days, repeat one tablet in 1 week if needed -topical clotrimazole or miconazole cream (available over the counter) 2 times daily for 2 weeks -probiotic (align or culturelle) for 2-4 weeks -loose cotton/natural fiber clothing and no bra if able at home, stay cool, no excessive scrubbing or washing -low sugar healthy diet -follow up in 2 weeks with PCP, sooner as needed

## 2018-09-04 ENCOUNTER — Telehealth: Payer: Self-pay | Admitting: *Deleted

## 2018-09-04 NOTE — Telephone Encounter (Signed)
Per pts daughter's request, appt was scheduled for 7/31.

## 2018-09-04 NOTE — Telephone Encounter (Signed)
-----   Message from Lucretia Kern, DO sent at 09/03/2018  6:10 PM EDT ----- Follow up in 2 weeks with pcp or me

## 2018-09-10 ENCOUNTER — Ambulatory Visit: Payer: PPO

## 2018-09-11 ENCOUNTER — Ambulatory Visit: Payer: PPO | Admitting: Family Medicine

## 2018-09-13 ENCOUNTER — Ambulatory Visit: Payer: PPO

## 2018-09-13 ENCOUNTER — Ambulatory Visit: Payer: PPO | Admitting: Family Medicine

## 2018-09-13 DIAGNOSIS — R200 Anesthesia of skin: Secondary | ICD-10-CM | POA: Diagnosis not present

## 2018-09-13 DIAGNOSIS — M5412 Radiculopathy, cervical region: Secondary | ICD-10-CM | POA: Diagnosis not present

## 2018-09-18 DIAGNOSIS — R200 Anesthesia of skin: Secondary | ICD-10-CM | POA: Diagnosis not present

## 2018-09-18 DIAGNOSIS — R29898 Other symptoms and signs involving the musculoskeletal system: Secondary | ICD-10-CM | POA: Diagnosis not present

## 2018-09-18 DIAGNOSIS — M5412 Radiculopathy, cervical region: Secondary | ICD-10-CM | POA: Diagnosis not present

## 2018-09-19 ENCOUNTER — Encounter: Payer: Self-pay | Admitting: Family Medicine

## 2018-09-19 ENCOUNTER — Other Ambulatory Visit: Payer: Self-pay

## 2018-09-19 ENCOUNTER — Telehealth (INDEPENDENT_AMBULATORY_CARE_PROVIDER_SITE_OTHER): Payer: PPO | Admitting: Family Medicine

## 2018-09-19 DIAGNOSIS — M7989 Other specified soft tissue disorders: Secondary | ICD-10-CM

## 2018-09-19 DIAGNOSIS — L089 Local infection of the skin and subcutaneous tissue, unspecified: Secondary | ICD-10-CM | POA: Diagnosis not present

## 2018-09-19 MED ORDER — DOXYCYCLINE HYCLATE 100 MG PO TABS: 100.0000 mg | ORAL_TABLET | Freq: Two times a day (BID) | ORAL | 0 refills | Status: DC

## 2018-09-19 NOTE — Progress Notes (Signed)
Virtual Visit via Video Note  I connected with [name]  on 09/19/18 at 11:00 AM EDT by a video enabled telemedicine application and verified that I am speaking with the correct person using two identifiers.  Location patient: home Location provider:work or home office Persons participating in the virtual visit: patient, provider  I discussed the limitations of evaluation and management by telemedicine and the availability of in person appointments. The patient expressed understanding and agreed to proceed.   HPI:  Acute visit for thumb issue: -L thumb started the last few days -redness, swelling, pruritis - started distally on print area - not as much dorsal involvement -spreading per daughter -no fevers, malaise, chills, joint pain or swelling, known trauma or insect bite   ROS: See pertinent positives and negatives per HPI.  Past Medical History:  Diagnosis Date  . Anemia    during 1st pregnancy  . Anxiety   . Arthritis   . Complication of anesthesia   . Depression   . Diarrhea, functional   . Dysrhythmia    Paroysmal Atrial Tachycardia  . Essential hypertension   . Fibromyalgia   . GERD (gastroesophageal reflux disease)   . H/O acute pancreatitis   . Headache   . History of hiatal hernia   . Hyperlipemia   . Osteoporosis   . PAT (paroxysmal atrial tachycardia) ([place])   . Pneumonia   . PONV (postoperative nausea and vomiting)   . PSVT (paroxysmal supraventricular tachycardia) ([place])   . Reflux   . Restless leg syndrome   . Type 2 diabetes mellitus (HCC)    Type II  . Vertigo     Past Surgical History:  Procedure Laterality Date  . ABDOMINAL HYSTERECTOMY    . ANKLE SURGERY    . ANTERIOR CERVICAL DECOMP/DISCECTOMY FUSION N/A 11/12/2017   Procedure: Cervical seven to Thoracic one  Anterior cervical decompression/discectomy/fusion with removal of old plate;  Surgeon: [name], MD;  Location: [place];  Service: Neurosurgery;  Laterality: N/A;  . BACK SURGERY      T12-L1 fusion, Anterior Cervical fusion  . CARDIAC CATHETERIZATION N/A 02/16/2015   Procedure: Left Heart Cath and Coronary Angiography;  Surgeon: Sherren Mocha, MD;  Location: [place] [facility];  Service: Cardiovascular;  Laterality: N/A;  . CHOLECYSTECTOMY    . COLONOSCOPY    . ESOPHAGOGASTRODUODENOSCOPY  12/21/2010   Procedure: ESOPHAGOGASTRODUODENOSCOPY (EGD);  Surgeon: Landry Dyke, MD;  Location: [facility];  Service: Endoscopy;  Laterality: N/A;  . EXCISION MORTON'S NEUROMA    . EYE SURGERY Bilateral    Cataract  . FRACTURE SURGERY     ankle x 2   . KNEE SURGERY     x 3  . NECK SURGERY    . POSTERIOR CERVICAL FUSION/FORAMINOTOMY N/A 01/22/2018   Procedure: Cervical six-seven Posterior Decompression with Posterior Fixation Cervical Five to Thoracic Two;  Surgeon: [name], MD;  Location: [place];  Service: Neurosurgery;  Laterality: N/A;  Cervical 6-7 Posteror decompression with posterior fixation Cervical 5 to Thoracic 2  . ROTATOR CUFF REPAIR    . TONSILLECTOMY    . TOTAL KNEE ARTHROPLASTY Left 04/10/2017   Procedure: LEFT TOTAL KNEE ARTHROPLASTY;  Surgeon: Paralee Cancel, MD;  Location: [facility];  Service: Orthopedics;  Laterality: Left;  70 mins  . UPPER GASTROINTESTINAL ENDOSCOPY    . WRIST SURGERY      Family History  Problem Relation Age of Onset  . Heart failure Mother   . Hyperlipidemia Mother   . Lung cancer  Father        Lymphoma  . Hyperlipidemia Maternal Grandmother   . Peripheral vascular disease Maternal Grandmother     SOCIAL HX: see hpi   Current Outpatient Medications:  .  acetaminophen (TYLENOL) 325 MG tablet, Take 325 mg by mouth at bedtime. , Disp: , Rfl:  .  aspirin EC 81 MG tablet, Take 81 mg by mouth every evening., Disp: , Rfl:  .  Calcium Carb-Cholecalciferol (CALCIUM 600+D3) 600-200 MG-UNIT TABS, Take 1 tablet by mouth every evening. Calcium 600 mg -Vitamin D 200 mg, Disp: , Rfl:  .  cholecalciferol (VITAMIN D) 1000 units tablet, Take  10,000 Units by mouth daily. Daily in the am, Disp: , Rfl:  .  Continuous Blood Gluc Receiver (DEXCOM G6 RECEIVER) DEVI, 1 Device by Does not apply route daily., Disp: 1 Device, Rfl: 0 .  Continuous Blood Gluc Sensor (DEXCOM G6 SENSOR) MISC, 1 kit by Does not apply route as directed., Disp: 3 each, Rfl: 3 .  Continuous Blood Gluc Transmit (DEXCOM G6 TRANSMITTER) MISC, 1 kit by Does not apply route as directed., Disp: 1 each, Rfl: 3 .  Cranberry 450 MG CAPS, Take 2 capsules in AM, Disp: , Rfl:  .  digoxin (LANOXIN) 0.125 MG tablet, Take 1 tablet (0.125 mg total) by mouth daily. Patient needs appointment for further refills, Disp: 90 tablet, Rfl: 3 .  DULoxetine (CYMBALTA) 30 MG capsule, Take 1 capsule (30 mg total) by mouth daily., Disp: 90 capsule, Rfl: 2 .  ezetimibe (ZETIA) 10 MG tablet, TAKE 1 TABLET BY MOUTH EVERY DAY, Disp: 90 tablet, Rfl: 1 .  fluconazole (DIFLUCAN) 150 MG tablet, Take one tablet daily for 3 days. Repeat 1 tablet in 1 week if needed., Disp: 4 tablet, Rfl: 0 .  glucose blood test strip, Use as instructed to test blood sugars 3 times daily DX.E11.42, Disp: 100 each, Rfl: 12 .  Insulin Aspart (NOVOLOG [place]), Inject 8 Units into the skin 3 (three) times daily before meals., Disp: , Rfl:  .  insulin glargine (LANTUS) 100 UNIT/ML injection, Inject 20 Units into the skin daily. Daily in the am , Disp: , Rfl:  .  Insulin Pen Needle (BD PEN NEEDLE MICRO U/F) 32G X 6 MM MISC, 4 times a day novolog(3) and lantus9(1), Disp: 100 each, Rfl: 6 .  meloxicam (MOBIC) 15 MG tablet, Take 1 tablet (15 mg total) by mouth daily. (Patient taking differently: Take 15 mg by mouth as needed. ), Disp: 90 tablet, Rfl: 1 .  metoprolol succinate (TOPROL-XL) 25 MG 24 hr tablet, Take 1 tablet (25 mg total) by mouth daily. Patient needs to schedule an appointment for an additional refill, Disp: 90 tablet, Rfl: 3 .  Multiple Vitamins-Minerals (PRESERVISION AREDS 2 PO), Take 2 tablets by mouth daily., Disp: , Rfl:   .  nitroGLYCERIN (NITROSTAT) 0.4 MG SL tablet, Place 0.4 mg under the tongue every 5 (five) minutes as needed for chest pain. , Disp: , Rfl:  .  nystatin (MYCOSTATIN/NYSTOP) powder, Apply topically 3 (three) times daily as needed., Disp: 15 g, Rfl: 6 .  nystatin-triamcinolone (MYCOLOG II) cream, Apply 1 application topically 2 (two) times daily as needed., Disp: 45 g, Rfl: 0 .  omeprazole (PRILOSEC) 20 MG capsule, Take 1 capsule (20 mg total) by mouth daily., Disp: 90 capsule, Rfl: 2 .  ondansetron (ZOFRAN) 4 MG tablet, Take 1 tablet (4 mg total) by mouth every 8 (eight) hours as needed for nausea or vomiting., Disp: 20 tablet, Rfl:  0 .  ramipril (ALTACE) 10 MG capsule, Take 1 capsule (10 mg total) by mouth every evening., Disp: 90 capsule, Rfl: 2 .  RELION INSULIN SYRINGE 1ML/31G 31G X 5/16" 1 ML MISC, Use as directed, Disp: 100 each, Rfl: 1 .  rOPINIRole (REQUIP) 1 MG tablet, TAKE ONE TABLET BY MOUTH AT BEDTIME., Disp: 90 tablet, Rfl: 1 .  tamsulosin (FLOMAX) 0.4 MG CAPS capsule, Take 1 capsule (0.4 mg total) by mouth daily., Disp: 90 capsule, Rfl: 0 .  TURMERIC PO, Take 500 mg by mouth 2 (two) times daily. , Disp: , Rfl:  .  verapamil (CALAN) 120 MG tablet, Take 120 mg by mouth daily as needed (afib). , Disp: , Rfl:  .  verapamil (VERELAN PM) 240 MG 24 hr capsule, Take 1 capsule (240 mg total) by mouth daily., Disp: 90 capsule, Rfl: 3 .  doxycycline (VIBRA-TABS) 100 MG tablet, Take 1 tablet (100 mg total) by mouth 2 (two) times daily., Disp: 10 tablet, Rfl: 0  EXAM:  VITALS per patient if applicable:denies fever  GENERAL: alert, oriented, appears well and in no acute distress  HEENT: atraumatic, conjunttiva clear, no obvious abnormalities on inspection of external nose and ears  NECK: normal movements of the head and neck  LUNGS: on inspection no signs of respiratory distress, breathing rate appears normal, no obvious gross SOB, gasping or wheezing  CV: no obvious  cyanosis  SKIN/HAND: erythema and edema of the L thumb more on plantar//flexeral surface, questionable small pustule on fingerprint  PSYCH/NEURO: pleasant and cooperative, no obvious depression or anxiety, speech and thought processing grossly intact  ASSESSMENT AND PLAN:  Discussed the following assessment and plan:  Finger swelling   Skin infection   Discussed potential etiologies, options for evaluation, risks, precautions. Query cellulitis from small lesion on print. Daughter does admit pt picks at dry skin on hands and wonders if this lead to infection. Opted to start abx and advised very close monitoring and skin marking and UCC/Hand eval if worsening rather then improving over the next 24 hours. Follow up with PCP as scheduled in a few days.   I discussed the assessment and treatment plan with the patient. The patient was provided an opportunity to ask questions and all were answered. The patient agreed with the plan and demonstrated an understanding of the instructions.   The patient was advised to call back or seek an in-person evaluation if the symptoms worsen or if the condition fails to improve as anticipated.   Lucretia Kern, DO

## 2018-09-23 ENCOUNTER — Encounter: Payer: Self-pay | Admitting: Family Medicine

## 2018-09-23 ENCOUNTER — Telehealth: Payer: Self-pay | Admitting: Family Medicine

## 2018-09-23 NOTE — Telephone Encounter (Signed)
Message sent to Dr. [name] for review. My chart message from patient's daughter also sent to Dr. [name].

## 2018-09-23 NOTE — Telephone Encounter (Signed)
Copied from [place] [id number]. Topic: General - Call Back - No Documentation >> Sep 23, 2018 10:00 AM Erick Blinks wrote: Reason for CRM: Pt's daughter requesting intermittent to help manage pt's health issues. [name] reports that fax has been sent from [employer]. Pt's daughter works 4 ten hour shifts and needs assistance with her full time care.  Best contact: [phone]

## 2018-09-24 ENCOUNTER — Other Ambulatory Visit: Payer: Self-pay

## 2018-09-24 ENCOUNTER — Encounter: Payer: Self-pay | Admitting: Family Medicine

## 2018-09-24 ENCOUNTER — Ambulatory Visit (INDEPENDENT_AMBULATORY_CARE_PROVIDER_SITE_OTHER): Payer: PPO | Admitting: Family Medicine

## 2018-09-24 VITALS — BP 124/76 | HR 71 | Temp 97.6°F | Resp 16 | Ht 64.0 in | Wt 194.4 lb

## 2018-09-24 DIAGNOSIS — L821 Other seborrheic keratosis: Secondary | ICD-10-CM | POA: Diagnosis not present

## 2018-09-24 DIAGNOSIS — L304 Erythema intertrigo: Secondary | ICD-10-CM

## 2018-09-24 DIAGNOSIS — Z0000 Encounter for general adult medical examination without abnormal findings: Secondary | ICD-10-CM

## 2018-09-24 MED ORDER — FLUCONAZOLE 150 MG PO TABS: 150.0000 mg | ORAL_TABLET | ORAL | 0 refills | Status: AC

## 2018-09-24 NOTE — Progress Notes (Signed)
HPI:   [name] is a 78 y.o. female with Hx of DM II,generalized OA, and fibromyalgia who is here today to discuss FMLA for daughter. Also c/o recurrent pruritic rash under breast. AWV, last one on 09/05/17.  She was last seen on 08/23/18, Nystatin powder and cream were recommended,helps some. Rash is getting worse. Denies fever,cills,oral lesions,or sore throat.  She was seen on 09/19/18 because finger swelling and rash. She was treated with Diflucan 150 mg daily x 3 days and this really helped but problem started back after finishing treatment.  Lab Results  Component Value Date   ALT 15 05/04/2017   AST 16 05/04/2017   ALKPHOS 98 05/04/2017   BILITOT 0.6 05/04/2017    She is also c/o pruritus of her upper and lower back for a while now.  AWV  She lives alone. She needs assistance for ADL's ADL's and IADL's. She has had a few falls in the past year.  Functional Status Survey: Is the patient deaf or have difficulty hearing?: No Does the patient have difficulty seeing, even when wearing glasses/contacts?: No Does the patient have difficulty concentrating, remembering, or making decisions?: No Does the patient have difficulty walking or climbing stairs?: Yes Does the patient have difficulty dressing or bathing?: Yes Does the patient have difficulty doing errands alone such as visiting a doctor's office or shopping?: Yes  Fall Risk  09/24/2018 08/23/2018 09/05/2017 03/27/2016 03/16/2016  Falls in the past year? 1 1 Yes No No  Number falls in past yr: _0 or more - -  Injury with Fall? 1 1 Yes - -  Risk Factor Category  - - High Fall Risk - -  Risk for fall due to : Impaired balance/gait;Orthopedic patient Orthopedic patient;Impaired balance/gait Impaired balance/gait - -  Risk for fall due to: Comment - - not picking up her feet; stumbled  - -  Follow up Education provided Education provided Education provided - -     Providers she sees regularly:  Eye  care provider: Next appt in 10/2018, not sure about who she is seeing. Endocrinologist, Dr Fernanda Drum, Dr Gaynelle Arabian Neurosurgeon: Dr [name] Dentist, Dr Berdine Addison. Dermatologist, Dr Denna Haggard Cardiologist, [facility].   Depression screen PHQ 2/9 09/24/2018  Decreased Interest 0  Down, Depressed, Hopeless 1  PHQ - 2 Score 1  Altered sleeping -  Tired, decreased energy -  Change in appetite -  Feeling bad or failure about yourself  -  Trouble concentrating -  Moving slowly or fidgety/restless -  Suicidal thoughts -  PHQ-9 Score -  Difficult doing work/chores -  Some recent data might be hidden    Mini-Cog - 09/28/18 2119    Normal clock drawing test?  yes    How many words correct?  3         Hearing Screening   _1  _2  _3  _4  _5  _6  _7  _8  _9   Right ear:           Left ear:           Vision Screening Comments: Refused today. She had vision screening 08/23/18 and has eye exam in 10/2018.   Review of Systems  Constitutional: Negative for activity change, appetite change and fatigue.  HENT: Negative for rhinorrhea.   Eyes: Negative for redness and visual disturbance.  Respiratory: Negative for cough, shortness of breath and wheezing.   Cardiovascular: Negative for chest pain, palpitations and leg swelling.  Gastrointestinal: Negative for abdominal pain,  nausea and vomiting.       Negative for changes in bowel habits.  Genitourinary: Negative for decreased urine volume and hematuria.  Psychiatric/Behavioral: The patient is nervous/anxious.   Rest see pertinent positives and negatives per HPI.   Current Outpatient Medications on File Prior to Visit  Medication Sig Dispense Refill  . acetaminophen (TYLENOL) 325 MG tablet Take 325 mg by mouth at bedtime.     Marland Kitchen aspirin EC 81 MG tablet Take 81 mg by mouth every evening.    . Calcium Carb-Cholecalciferol (CALCIUM 600+D3) 600-200 MG-UNIT TABS Take 1 tablet by mouth every evening. Calcium 600 mg -Vitamin D  200 mg    . cholecalciferol (VITAMIN D) 1000 units tablet Take 10,000 Units by mouth daily. Daily in the am    . Continuous Blood Gluc Receiver (DEXCOM G6 RECEIVER) DEVI 1 Device by Does not apply route daily. 1 Device 0  . Continuous Blood Gluc Sensor (DEXCOM G6 SENSOR) MISC 1 kit by Does not apply route as directed. 3 each 3  . Continuous Blood Gluc Transmit (DEXCOM G6 TRANSMITTER) MISC 1 kit by Does not apply route as directed. 1 each 3  . Cranberry 450 MG CAPS Take 2 capsules in AM    . digoxin (LANOXIN) 0.125 MG tablet Take 1 tablet (0.125 mg total) by mouth daily. Patient needs appointment for further refills 90 tablet 3  . DULoxetine (CYMBALTA) 30 MG capsule Take 1 capsule (30 mg total) by mouth daily. 90 capsule 2  . ezetimibe (ZETIA) 10 MG tablet TAKE 1 TABLET BY MOUTH EVERY DAY 90 tablet 1  . glucose blood test strip Use as instructed to test blood sugars 3 times daily DX.E11.42 100 each 12  . Insulin Aspart (NOVOLOG [place]) Inject 8 Units into the skin 3 (three) times daily before meals.    . insulin glargine (LANTUS) 100 UNIT/ML injection Inject 20 Units into the skin daily. Daily in the am     . Insulin Pen Needle (BD PEN NEEDLE MICRO U/F) 32G X 6 MM MISC 4 times a day novolog(3) and lantus9(1) 100 each 6  . meloxicam (MOBIC) 15 MG tablet Take 1 tablet (15 mg total) by mouth daily. (Patient taking differently: Take 15 mg by mouth as needed. ) 90 tablet 1  . metoprolol succinate (TOPROL-XL) 25 MG 24 hr tablet Take 1 tablet (25 mg total) by mouth daily. Patient needs to schedule an appointment for an additional refill 90 tablet 3  . Multiple Vitamins-Minerals (PRESERVISION AREDS 2 PO) Take 2 tablets by mouth daily.    . nitroGLYCERIN (NITROSTAT) 0.4 MG SL tablet Place 0.4 mg under the tongue every 5 (five) minutes as needed for chest pain.     Marland Kitchen nystatin (MYCOSTATIN/NYSTOP) powder Apply topically 3 (three) times daily as needed. 15 g 6  . nystatin cream (MYCOSTATIN) APPLY 1 APPLICATION  TOPICALLY TWICE A DAY AS NEEDED *MIX WITH TRIAMCINOLONE    . nystatin-triamcinolone (MYCOLOG II) cream Apply 1 application topically 2 (two) times daily as needed. 45 g 0  . omeprazole (PRILOSEC) 20 MG capsule Take 1 capsule (20 mg total) by mouth daily. 90 capsule 2  . ondansetron (ZOFRAN) 4 MG tablet Take 1 tablet (4 mg total) by mouth every 8 (eight) hours as needed for nausea or vomiting. 20 tablet 0  . ramipril (ALTACE) 10 MG capsule Take 1 capsule (10 mg total) by mouth every evening. 90 capsule 2  . RELION INSULIN SYRINGE 1ML/31G 31G X 5/16" 1 ML MISC Use as  directed 100 each 1  . tamsulosin (FLOMAX) 0.4 MG CAPS capsule Take 1 capsule (0.4 mg total) by mouth daily. 90 capsule 0  . triamcinolone cream (KENALOG) 0.1 % APPLY 1 APPLICATION TOPICALLY TWICE A DAY AS NEEDED *MIX WITH NYSTATIN    . TURMERIC PO Take 500 mg by mouth 2 (two) times daily.     . verapamil (CALAN) 120 MG tablet Take 120 mg by mouth daily as needed (afib).     . verapamil (VERELAN PM) 240 MG 24 hr capsule Take 1 capsule (240 mg total) by mouth daily. 90 capsule 3  . Vitamin D, Ergocalciferol, (DRISDOL) 1.25 MG (50000 UT) CAPS capsule Take by mouth.     No current facility-administered medications on file prior to visit.      Past Medical History:  Diagnosis Date  . Anemia    during 1st pregnancy  . Anxiety   . Arthritis   . Complication of anesthesia   . Depression   . Diarrhea, functional   . Dysrhythmia    Paroysmal Atrial Tachycardia  . Essential hypertension   . Fibromyalgia   . GERD (gastroesophageal reflux disease)   . H/O acute pancreatitis   . Headache   . History of hiatal hernia   . Hyperlipemia   . Osteoporosis   . PAT (paroxysmal atrial tachycardia) ([place])   . Pneumonia   . PONV (postoperative nausea and vomiting)   . PSVT (paroxysmal supraventricular tachycardia) ([place])   . Reflux   . Restless leg syndrome   . Type 2 diabetes mellitus (HCC)    Type II  . Vertigo    Allergies   Allergen Reactions  . Amoxil [Amoxicillin] Swelling and Other (See Comments)    Lips Has patient had a PCN reaction causing immediate rash, facial/tongue/throat swelling, SOB or lightheadedness with hypotension:YES Has patient had a PCN reaction causing severe rash involving mucus membranes or skin necrosis:No Has patient had a PCN reaction that required hospitalization:No Has patient had a PCN reaction occurring within the last 10 years:Yes. If all of the above answers are "NO", then may proceed with Cephalosporin use.   . Chocolate Hives  . Demerol Nausea And Vomiting  . Lisinopril Itching and Swelling  . Other     Raw food or nuts - GI pain    Social History   Socioeconomic History  . Marital status: Widowed    Spouse name: Glendell Docker  . Number of children: 2  . Years of education: Not on file  . Highest education level: Not on file  Occupational History  . Not on file  Social Needs  . Financial resource strain: Not on file  . Food insecurity    Worry: Not on file    Inability: Not on file  . Transportation needs    Medical: Not on file    Non-medical: Not on file  Tobacco Use  . Smoking status: Former Research scientist (life sciences)  . Smokeless tobacco: Never Used  Substance and Sexual Activity  . Alcohol use: No  . Drug use: No  . Sexual activity: Not on file  Lifestyle  . Physical activity    Days per week: Not on file    Minutes per session: Not on file  . Stress: Not on file  Relationships  . Social Herbalist on phone: Not on file    Gets together: Not on file    Attends religious service: Not on file    Active member of club or organization: Not  on file    Attends meetings of clubs or organizations: Not on file    Relationship status: Not on file  Other Topics Concern  . Not on file  Social History Narrative   Lives home alone.  Daughter [name] with her today.   Independent of ADLs.  Education HS.  Widowed.      Vitals:   09/24/18 1500  BP: 124/76  Pulse: 71   Resp: 16  Temp: 97.6 F (36.4 C)  SpO2: 97%   Body mass index is 33.36 kg/m.   Physical Exam  Nursing note and vitals reviewed. Constitutional: She is oriented to person, place, and time. She appears well-developed. No distress.  HENT:  Head: Atraumatic.  Mouth/Throat: Oropharynx is clear and moist and mucous membranes are normal.  Eyes: Conjunctivae are normal.  Respiratory: Effort normal and breath sounds normal. No respiratory distress.  Musculoskeletal:        General: No edema.  Lymphadenopathy:    She has no cervical adenopathy.  Neurological: She is alert and oriented to person, place, and time. Gait abnormal.  Gait assisted by a walker  Skin: Skin is warm. Rash noted. Rash is not vesicular.  Hyperkeratotic lesions scarred on her upper and mid back. No erythema appreciated on back.  Under breast crease with erythema, not tender or indurated.  Psychiatric: She has a normal mood and affect.  Well groomed, good eye contact.   ASSESSMENT AND PLAN:  Ms. Alysha was seen today for follow-up and medicare wellness.  Diagnoses and all orders for this visit:  Intertrigo Recurrent. No Hx of liver disease,so we will repeat Diflucan treatment, 150 mg q week x 4. We discussed side effects. Continue Nystatin powder. Recommend silicon bra with no straps in other to keep breast up.  -     fluconazole (DIFLUCAN) 150 MG tablet; Take 1 tablet (150 mg total) by mouth once a week for 4 doses.  Seborrheic keratosis Educated about Dx, this could be pruritus.  Medicare annual wellness visit, subsequent We discussed the importance of staying active, physically and mentally, as well as the benefits of a healthy/balance diet. Low impact exercise that involve stretching and strengthing are ideal. Vaccines up to date. We discussed preventive screening for the next 5-10 years, summery of recommendations given in AVS:    Health Maintenance  Topic Date Due  . Flu Shot  09/14/2018  .  Eye exam for diabetics  10/29/2018*  . Complete foot exam   03/17/2019*  . Hemoglobin A1C  12/22/2018  . DEXA scan (bone density measurement)  Completed  . Pneumonia vaccines  Completed  . Tetanus Vaccine  Discontinued  *Topic was postponed. The date shown is not the original due date.    Fall prevention.  Advance directives and end of life discussed, she has POA and living will.   Will complete FMLA for 20 hours per month sh she can take her for appts.   Return in about 6 months (around 03/27/2019).   -Ms. SUGEY TREVATHAN was advised to return sooner than planned today if new concerns arise.    Cleola Perryman G. [name], MD  [facility]. [place] office.

## 2018-09-24 NOTE — Patient Instructions (Signed)
[name] , Thank you for taking time to come for your Medicare Wellness Visit. I appreciate your ongoing commitment to your health goals. Please review the following plan we discussed and let me know if I can assist you in the future.   These are the goals we discussed: Goals    . Patient Stated     To feel better        This is a list of the screening recommended for you and due dates:  Health Maintenance  Topic Date Due  . Tetanus Vaccine  08/21/1959  . Complete foot exam   04/09/2018  . Flu Shot  09/14/2018  . Eye exam for diabetics  09/18/2018  . Hemoglobin A1C  12/22/2018  . DEXA scan (bone density measurement)  Completed  . Pneumonia vaccines  Completed    A few things to remember from today's visit:   Intertrigo - Plan: fluconazole (DIFLUCAN) 150 MG tablet  Seborrheic keratosis  Medicare annual wellness visit, subsequent  Intertrigo Intertrigo is skin irritation or inflammation (dermatitis) that occurs when folds of skin rub together. The irritation can cause a rash and make skin raw and itchy. This condition most commonly occurs in the skin folds of these areas:  Toes.  Armpits.  Groin.  Under the belly.  Under the breasts.  Buttocks. Intertrigo is not passed from person to person (is not contagious). What are the causes? This condition is caused by heat, moisture, rubbing (friction), and not enough air circulation. The condition can be made worse by:  Sweat.  Bacteria.  A fungus, such as yeast. What increases the risk? This condition is more likely to occur if you have moisture in your skin folds. You are more likely to develop this condition if you:  Have diabetes.  Are overweight.  Are not able to move around or are not active.  Live in a warm and moist climate.  Wear splints, braces, or other medical devices.  Are not able to control your bowels or bladder (have incontinence). What are the signs or symptoms? Symptoms of this  condition include:  A pink or red skin rash in the skin fold or near the skin fold.  Raw or scaly skin.  Itchiness.  A burning feeling.  Bleeding.  Leaking fluid.  A bad smell. How is this diagnosed? This condition is diagnosed with a medical history and physical exam. You may also have a skin swab to test for bacteria or a fungus. How is this treated? This condition may be treated by:  Cleaning and drying your skin.  Taking an antibiotic medicine or using an antibiotic skin cream for a bacterial infection.  Using an antifungal cream on your skin or taking pills for an infection that was caused by a fungus, such as yeast.  Using a steroid ointment to relieve itchiness and irritation.  Separating the skin fold with a clean cotton cloth to absorb moisture and allow air to flow into the area. Follow these instructions at home:  Keep the affected area clean and dry.  Do not scratch your skin.  Stay in a cool environment as much as possible. Use an air conditioner or fan, if available.  Apply over-the-counter and prescription medicines only as told by your health care provider.  If you were prescribed an antibiotic medicine, use it as told by your health care provider. Do not stop using the antibiotic even if your condition improves.  Keep all follow-up visits as told by your health care  provider. This is important. How is this prevented?   Maintain a healthy weight.  Take care of your feet, especially if you have diabetes. Foot care includes: ? Wearing shoes that fit well. ? Keeping your feet dry. ? Wearing clean, breathable socks.  Protect the skin around your groin and buttocks, especially if you have incontinence. Skin protection includes: ? Following a regular cleaning routine. ? Using skin protectant creams, powders, or ointments. ? Changing protection pads frequently.  Do not wear tight clothes. Wear clothes that are loose, absorbent, and made of cotton.   Wear a bra that gives good support, if needed.  Shower and dry yourself well after activity or exercise. Use a hair dryer on a cool setting to dry between skin folds, especially after you bathe.  If you have diabetes, keep your blood sugar under control. Contact a health care provider if:  Your symptoms do not improve with treatment.  Your symptoms get worse or they spread.  You notice increased redness and warmth.  You have a fever. Summary  Intertrigo is skin irritation or inflammation (dermatitis) that occurs when folds of skin rub together.  This condition is caused by heat, moisture, rubbing (friction), and not enough air circulation.  This condition may be treated by cleaning and drying your skin and with medicines.  Apply over-the-counter and prescription medicines only as told by your health care provider.  Keep all follow-up visits as told by your health care provider. This is important. This information is not intended to replace advice given to you by your health care provider. Make sure you discuss any questions you have with your health care provider. Document Released: 01/30/2005 Document Revised: 07/02/2017 Document Reviewed: 07/02/2017 Elsevier Patient Education  [place].   Please be sure medication list is accurate. If a new problem present, please set up appointment sooner than planned today.

## 2018-09-25 ENCOUNTER — Other Ambulatory Visit: Payer: Self-pay | Admitting: Family Medicine

## 2018-09-25 ENCOUNTER — Other Ambulatory Visit: Payer: Self-pay

## 2018-09-25 DIAGNOSIS — G2581 Restless legs syndrome: Secondary | ICD-10-CM

## 2018-09-25 NOTE — Telephone Encounter (Signed)
This issue was already discussed during OV,09/24/18. Thaddaeus Granja [name], MD

## 2018-09-26 DIAGNOSIS — E1149 Type 2 diabetes mellitus with other diabetic neurological complication: Secondary | ICD-10-CM | POA: Diagnosis not present

## 2018-09-27 ENCOUNTER — Ambulatory Visit: Payer: PPO | Admitting: Internal Medicine

## 2018-09-27 NOTE — Progress Notes (Deleted)
Name: [name]  Age/ Sex: 78 y.o., female   MRN/ DOB: [id number], [birth year]     PCP: [name], [name], MD   Reason for Endocrinology Evaluation: Type 2 diabetes Mellitus  Initial Endocrine Consultative Visit:  12/28/2017    PATIENT IDENTIFIER: [name] is a 78 y.o. female with a past medical history of PAT, T2 DM, and urinary incontinence, Hx of pancreatitis. The patient has followed with Endocrinology clinic since 12/28/2017 for consultative assistance with management of her diabetes.  DIABETIC HISTORY:  [name] was diagnosed with T2DM > 10 years ago.  She is intolerant to metformin due to fecal incontinence on 2000 mg a day. Her hemoglobin A1c has ranged from 6.7% in 09/01/2017, peaking at 7.9% in 2011.   SUBJECTIVE:   During the last visit (06/21/2018): Her A1c was 5.8%, we continued lantus and Novolog doses.   Today (09/27/2018): [name] is here for a 4 week follow up for diabetes management. She is with her daughter [name].  She checks her blood sugars 4 times daily, preprandial to breakfast and bedtime. The patient has not had hypoglycemic episodes since the last clinic visit. Otherwise, the patient has not required any recent emergency interventions for hypoglycemia and has not had recent hospitalizations secondary to hyper or hypoglycemic episodes.    ROS: As per HPI and as detailed below: Review of Systems  Constitutional: Negative for fever and malaise/fatigue.  Respiratory: Negative.   Cardiovascular: Negative.   Genitourinary: Negative for frequency.  Skin: Negative.  Rash:    Neurological: Positive for tingling and focal weakness.  Endo/Heme/Allergies: Negative for polydipsia.      HOME DIABETES REGIMEN:  Lantus 25 units BID  Novolog 8 units TIDQAC  Statin: No ACE-I/ARB: Yes    METER DOWNLOAD SUMMARY:4/24-06/20/2018 Fingerstick Blood Glucose Tests = 54 Overall  Mean FS Glucose = 153.6 Standard Deviation = 40.9  BG Ranges: Low = 105 High = 255   Hypoglycemic Events/30 Days: BG < 50 = 0 Episodes of symptomatic severe hypoglycemia = 0        DIABETIC COMPLICATIONS: Microvascular complications:   Neuropathy   Denies: retinopathy, nephropathy   Last eye exam: Completed 2019  Macrovascular complications:    Denies: CAD, PVD, CVA   HISTORY:  Past Medical History:  Past Medical History:  Diagnosis Date   Anemia    during 1st pregnancy   Anxiety    Arthritis    Complication of anesthesia    Depression    Diarrhea, functional    Dysrhythmia    Paroysmal Atrial Tachycardia   Essential hypertension    Fibromyalgia    GERD (gastroesophageal reflux disease)    H/O acute pancreatitis    Headache    History of hiatal hernia    Hyperlipemia    Osteoporosis    PAT (paroxysmal atrial tachycardia) (HCC)    Pneumonia    PONV (postoperative nausea and vomiting)    PSVT (paroxysmal supraventricular tachycardia) (HCC)    Reflux    Restless leg syndrome    Type 2 diabetes mellitus ([place])    Type II   Vertigo    Past Surgical History:  Past Surgical History:  Procedure Laterality Date   ABDOMINAL HYSTERECTOMY     ANKLE SURGERY     ANTERIOR CERVICAL DECOMP/DISCECTOMY FUSION N/A 11/12/2017   Procedure: Cervical seven to Thoracic one  Anterior cervical decompression/discectomy/fusion with removal of old plate;  Surgeon: [name], MD;  Location: [facility];  Service: Neurosurgery;  Laterality: N/A;   BACK SURGERY     T12-L1 fusion, Anterior Cervical fusion   CARDIAC CATHETERIZATION N/A 02/16/2015   Procedure: Left Heart Cath and Coronary Angiography;  Surgeon: Sherren Mocha, MD;  Location: [facility];  Service: Cardiovascular;  Laterality: N/A;   CHOLECYSTECTOMY     COLONOSCOPY     ESOPHAGOGASTRODUODENOSCOPY  12/21/2010   Procedure: ESOPHAGOGASTRODUODENOSCOPY (EGD);  Surgeon: Landry Dyke, MD;  Location: [facility];  Service: Endoscopy;  Laterality: N/A;   EXCISION MORTON'S NEUROMA     EYE SURGERY Bilateral    Cataract   FRACTURE SURGERY     ankle x 2    KNEE SURGERY     x 3   NECK SURGERY     POSTERIOR CERVICAL FUSION/FORAMINOTOMY N/A 01/22/2018   Procedure: Cervical six-seven Posterior Decompression with Posterior Fixation Cervical Five to Thoracic Two;  Surgeon: [name], MD;  Location: [facility];  Service: Neurosurgery;  Laterality: N/A;  Cervical 6-7 Posteror decompression with posterior fixation Cervical 5 to Thoracic 2   ROTATOR CUFF REPAIR     TONSILLECTOMY     TOTAL KNEE ARTHROPLASTY Left 04/10/2017   Procedure: LEFT TOTAL KNEE ARTHROPLASTY;  Surgeon: Paralee Cancel, MD;  Location: [facility];  Service: Orthopedics;  Laterality: Left;  70 mins   UPPER GASTROINTESTINAL ENDOSCOPY     WRIST SURGERY      Social History:  reports that she has quit smoking. She has never used smokeless tobacco. She reports that she does not drink alcohol or use drugs. Family History:  Family History  Problem Relation Age of Onset   Heart failure Mother    Hyperlipidemia Mother    Lung cancer Father        Lymphoma   Hyperlipidemia Maternal Grandmother    Peripheral vascular disease Maternal Grandmother      HOME MEDICATIONS: Allergies as of 09/27/2018      Reactions   Amoxil [amoxicillin] Swelling, Other (See Comments)   Lips Has patient had a PCN reaction causing immediate rash, facial/tongue/throat swelling, SOB or lightheadedness with hypotension:YES Has patient had a PCN reaction causing severe rash involving mucus membranes or skin necrosis:No Has patient had a PCN reaction that required hospitalization:No Has patient had a PCN reaction occurring within the last 10 years:Yes. If all of the above answers are "NO", then may proceed with Cephalosporin use.   Chocolate Hives   Demerol Nausea And Vomiting   Lisinopril Itching, Swelling   Other    Raw  food or nuts - GI pain      Medication List       Accurate as of September 27, 2018  1:06 PM. If you have any questions, ask your nurse or doctor.        acetaminophen 325 MG tablet Commonly known as: TYLENOL Take 325 mg by mouth at bedtime.   aspirin EC 81 MG tablet Take 81 mg by mouth every evening.   Calcium 600+D3 600-200 MG-UNIT Tabs Generic drug: Calcium Carb-Cholecalciferol Take 1 tablet by mouth every evening. Calcium 600 mg -Vitamin D 200 mg   cholecalciferol 1000 units tablet Commonly known as: VITAMIN D Take 10,000 Units by mouth daily. Daily in the am   Cranberry 450 MG Caps Take 2 capsules in AM   Dexcom G6 Receiver Devi 1 Device by Does not apply route daily.   Dexcom G6 Sensor Misc 1 kit by Does not apply route as directed.   Dexcom G6 Transmitter Misc 1 kit by Does not  apply route as directed.   digoxin 0.125 MG tablet Commonly known as: Lanoxin Take 1 tablet (0.125 mg total) by mouth daily. Patient needs appointment for further refills   DULoxetine 30 MG capsule Commonly known as: Cymbalta Take 1 capsule (30 mg total) by mouth daily.   ezetimibe 10 MG tablet Commonly known as: ZETIA TAKE 1 TABLET BY MOUTH EVERY DAY   fluconazole 150 MG tablet Commonly known as: DIFLUCAN Take 1 tablet (150 mg total) by mouth once a week for 4 doses.   glucose blood test strip Use as instructed to test blood sugars 3 times daily [id number]   insulin glargine 100 UNIT/ML injection Commonly known as: LANTUS Inject 20 Units into the skin daily. Daily in the am   Insulin Pen Needle 32G X 6 MM Misc Commonly known as: BD Pen Needle Micro U/F 4 times a day novolog(3) and lantus9(1)   meloxicam 15 MG tablet Commonly known as: MOBIC Take 1 tablet (15 mg total) by mouth daily. What changed:   when to take this  reasons to take this   metoprolol succinate 25 MG 24 hr tablet Commonly known as: TOPROL-XL Take 1 tablet (25 mg total) by mouth daily. Patient needs  to schedule an appointment for an additional refill   nitroGLYCERIN 0.4 MG SL tablet Commonly known as: NITROSTAT Place 0.4 mg under the tongue every 5 (five) minutes as needed for chest pain.   NOVOLOG [place] Inject 8 Units into the skin 3 (three) times daily before meals.   nystatin powder Commonly known as: MYCOSTATIN/NYSTOP Apply topically 3 (three) times daily as needed.   nystatin cream Commonly known as: MYCOSTATIN APPLY 1 APPLICATION TOPICALLY TWICE A DAY AS NEEDED *MIX WITH TRIAMCINOLONE   nystatin-triamcinolone cream Commonly known as: MYCOLOG II Apply 1 application topically 2 (two) times daily as needed.   omeprazole 20 MG capsule Commonly known as: PRILOSEC Take 1 capsule (20 mg total) by mouth daily.   ondansetron 4 MG tablet Commonly known as: Zofran Take 1 tablet (4 mg total) by mouth every 8 (eight) hours as needed for nausea or vomiting.   PRESERVISION AREDS 2 PO Take 2 tablets by mouth daily.   ramipril 10 MG capsule Commonly known as: ALTACE Take 1 capsule (10 mg total) by mouth every evening.   RELION INSULIN SYRINGE 1ML/31G 31G X 5/16" 1 ML Misc Generic drug: Insulin Syringe-Needle U-100 Use as directed   rOPINIRole 1 MG tablet Commonly known as: REQUIP TAKE ONE TABLET BY MOUTH AT BEDTIME.   tamsulosin 0.4 MG Caps capsule Commonly known as: FLOMAX Take 1 capsule (0.4 mg total) by mouth daily.   triamcinolone cream 0.1 % Commonly known as: KENALOG APPLY 1 APPLICATION TOPICALLY TWICE A DAY AS NEEDED *MIX WITH NYSTATIN   TURMERIC PO Take 500 mg by mouth 2 (two) times daily.   verapamil 120 MG tablet Commonly known as: CALAN Take 120 mg by mouth daily as needed (afib).   verapamil 240 MG 24 hr capsule Commonly known as: VERELAN PM Take 1 capsule (240 mg total) by mouth daily.   Vitamin D (Ergocalciferol) 1.25 MG (50000 UT) Caps capsule Commonly known as: DRISDOL Take by mouth.        OBJECTIVE:   Vital Signs: There were no vitals  taken for this visit.  Wt Readings from Last 3 Encounters:  09/24/18 194 lb 6 oz (88.2 kg)  08/23/18 182 lb (82.6 kg)  06/21/18 185 lb (83.9 kg)     Exam: General: Pt  appears well and is in NAD  Neck: General: Supple without adenopathy. Thyroid: Thyroid size normal.  No goiter or nodules appreciated. No thyroid bruit.  Lungs: Clear with good BS bilat with no rales, rhonchi, or wheezes  Heart: RRR with normal S1 and S2 and no gallops; no murmurs; no rub  Abdomen: Normoactive bowel sounds, soft, nontender, without masses or organomegaly palpable  Extremities: No pretibial edema. No tremor. Normal strength and motion throughout. See detailed diabetic foot exam below.  Neuro: MS is good with appropriate affect, pt is alert and Ox3    DM foot exam:04/12/2018 The skin of the feet is intact without sores or ulcerations. The pedal pulses are 2+ on right and 2+ on left. The sensation is intact to a screening 5.07, 10 gram monofilament bilaterally            DATA REVIEWED:  Lab Results  Component Value Date   HGBA1C 5.9 (A) 06/21/2018   HGBA1C 6.2 (A) 04/12/2018   HGBA1C 7.6 (H) 11/12/2017   Lab Results  Component Value Date   MICROALBUR 26.1 (H) 01/12/2017   LDLCALC 82 01/20/2016   CREATININE 0.94 08/23/2018   Lab Results  Component Value Date   MICRALBCREAT 14.1 01/12/2017     ASSESSMENT / PLAN / RECOMMENDATIONS:   1) Type 2 Diabetes Mellitus, With  neuropathic complications - Most recent A1c of 5.9 %. Goal A1c < 7.0 %.   -In review of her meter download today, her BG's have been within acceptable range, without hypoglycemia. -She is intolerant to metformin, patient did state that she had pancreatitis back in 2014, I reviewed her hospitalization and her lipase has only been at 127, patient was diagnosed with gastroenteritis there is no mention of pancreatitis.  This was discussed with the patient, but daughter is concerned, so we will not offer DPP-4 inhibitors nor  GLP-1 agonists  -She is again concerned about Trulicity, especially with the risk of nausea and diarrhea, but also because a cousin has some form of thyroid cancer.  -I explained to her that unfortunately given her age and above concerns, we are limited in starting any other glycemic agents and insulin is the safest at this time. - She was approved by [employer] , but has not received it yet, she was advised to call our office to schedule an appointment with our CDE for CGM education.   MEDICATIONS:  Decrease Lantus to 20 units once a day  NovoLog 8 units 3 times daily q. before meals  EDUCATION / INSTRUCTIONS:  BG monitoring instructions: Patient is instructed to check her blood sugars 4 times a day, before meals and bedtime.  Call [facility] if: BG persistently < 70 or > 300.  I reviewed the Rule of 15 for the treatment of hypoglycemia in detail with the patient. Literature supplied.    2) Diabetic complications:   Eye: Does not have known diabetic retinopathy.   Neuro/ Feet: Does have known diabetic peripheral neuropathy .   Renal: Patient does not have known baseline CKD. She   is on an ACEI/ARB at present.     F/U in 3 months    signed electronically by: Mack Guise, MD  [facility]  [facility] [address] [place], [place] [address] Phone: [phone] FAX: [phone]   CC: [name], [name], [place] [address] Phone: [phone]  Fax: [phone]  Return to Endocrinology clinic as below: Future Appointments  Date Time  Provider [place]  09/27/2018  2:00 PM Staci Carver, Melanie Crazier, MD [facility] None  10/29/2018  3:40 PM Jerline Pain, MD [facility]  01/27/2019 10:30 AM [name], [name], MD [facility]

## 2018-09-27 NOTE — Telephone Encounter (Signed)
pts daughter ms. Noberto Retort came by and picked up [facility] paperwork and it was no charge.

## 2018-09-30 ENCOUNTER — Other Ambulatory Visit: Payer: Self-pay

## 2018-10-01 ENCOUNTER — Other Ambulatory Visit: Payer: Self-pay | Admitting: Family Medicine

## 2018-10-02 ENCOUNTER — Encounter: Payer: Self-pay | Admitting: Internal Medicine

## 2018-10-02 ENCOUNTER — Ambulatory Visit (INDEPENDENT_AMBULATORY_CARE_PROVIDER_SITE_OTHER): Payer: PPO | Admitting: Internal Medicine

## 2018-10-02 ENCOUNTER — Other Ambulatory Visit: Payer: Self-pay

## 2018-10-02 VITALS — BP 138/78 | HR 70 | Temp 98.3°F | Ht 64.0 in | Wt 197.6 lb

## 2018-10-02 DIAGNOSIS — E1142 Type 2 diabetes mellitus with diabetic polyneuropathy: Secondary | ICD-10-CM

## 2018-10-02 DIAGNOSIS — Z794 Long term (current) use of insulin: Secondary | ICD-10-CM

## 2018-10-02 LAB — POCT GLYCOSYLATED HEMOGLOBIN (HGB A1C): Hemoglobin A1C: 6.8 % — AB (ref 4.0–5.6)

## 2018-10-02 MED ORDER — NOVOLOG FLEXPEN 100 UNIT/ML ~~LOC~~ SOPN: PEN_INJECTOR | SUBCUTANEOUS | 11 refills | Status: DC

## 2018-10-02 NOTE — Progress Notes (Signed)
Name: [name]  Age/ Sex: 78 y.o., female   MRN/ DOB: [id number], 1940-04-12     PCP: [name], [name], MD   Reason for Endocrinology Evaluation: Type 2 diabetes Mellitus  Initial Endocrine Consultative Visit:  12/28/2017    PATIENT IDENTIFIER: [name] is a 78 y.o. female with a past medical history of PAT, T2 DM, and urinary incontinence, Hx of pancreatitis. The patient has followed with Endocrinology clinic since 12/28/2017 for consultative assistance with management of her diabetes.  DIABETIC HISTORY:  [name] was diagnosed with T2DM > 10 years ago.  She is intolerant to metformin due to fecal incontinence on 2000 mg a day. Her hemoglobin A1c has ranged from 6.7% in 09/01/2017, peaking at 7.9% in 2011.   SUBJECTIVE:   During the last visit (06/21/2018): Her A1c was 5.8%, we continued lantus and Novolog doses.   Today (10/02/2018): [name] is here for a 3 month follow up for diabetes management. She is with her daughter [name].  She checks her blood sugars multiple  times daily through the Dexcom . The patient has not had hypoglycemic episodes since the last clinic visit. Otherwise, the patient has not required any recent emergency interventions for hypoglycemia and has not had recent hospitalizations secondary to hyper or hypoglycemic episodes.    ROS: As per HPI and as detailed below: Review of Systems  Constitutional: Negative for fever and malaise/fatigue.  Respiratory: Negative.   Cardiovascular: Negative.   Genitourinary: Negative for frequency.  Skin: Negative.  Rash:    Neurological: Positive for tingling and focal weakness.  Endo/Heme/Allergies: Negative for polydipsia.      HOME DIABETES REGIMEN:  Lantus 20 units BID  Novolog 8 units TIDQAC   CONTINUOUS GLUCOSE MONITORING RECORD INTERPRETATION    Dates of Recording: 8/6-8/19/20  Sensor description:Dexcom G6  Results statistics:   CGM use % of time 100  Average and SD 179/38  Time in  range      59  %  % Time Above 180 36  % Time above 250 5  % Time Below target 0    Glycemic patterns summary: Hyperglycemia  Starts after lunch and continues overnight   Hyperglycemic episodes  Post-lunch and supper  Hypoglycemic episodes occurred N/A  Overnight periods: Stable    DIABETIC COMPLICATIONS: Microvascular complications:   Neuropathy   Denies: retinopathy, nephropathy   Last eye exam: Completed 2019  Macrovascular complications:    Denies: CAD, PVD, CVA   HISTORY:  Past Medical History:  Past Medical History:  Diagnosis Date  . Anemia    during 1st pregnancy  . Anxiety   . Arthritis   . Complication of anesthesia   . Depression   . Diarrhea, functional   . Dysrhythmia    Paroysmal Atrial Tachycardia  . Essential hypertension   . Fibromyalgia   . GERD (gastroesophageal reflux disease)   . H/O acute pancreatitis   . Headache   . History of hiatal hernia   . Hyperlipemia   . Osteoporosis   . PAT (paroxysmal atrial tachycardia) ([place])   . Pneumonia   . PONV (postoperative nausea and vomiting)   . PSVT (paroxysmal supraventricular tachycardia) ([place])   . Reflux   . Restless leg syndrome   . Type 2 diabetes mellitus (HCC)    Type II  . Vertigo    Past Surgical History:  Past Surgical History:  Procedure Laterality Date  . ABDOMINAL HYSTERECTOMY    . ANKLE SURGERY    .  ANTERIOR CERVICAL DECOMP/DISCECTOMY FUSION N/A 11/12/2017   Procedure: Cervical seven to Thoracic one  Anterior cervical decompression/discectomy/fusion with removal of old plate;  Surgeon: [name], MD;  Location: [facility];  Service: Neurosurgery;  Laterality: N/A;  . BACK SURGERY     T12-L1 fusion, Anterior Cervical fusion  . CARDIAC CATHETERIZATION N/A 02/16/2015   Procedure: Left Heart Cath and Coronary Angiography;  Surgeon: Sherren Mocha, MD;  Location: [facility];  Service: Cardiovascular;  Laterality: N/A;  . CHOLECYSTECTOMY    . COLONOSCOPY    .  ESOPHAGOGASTRODUODENOSCOPY  12/21/2010   Procedure: ESOPHAGOGASTRODUODENOSCOPY (EGD);  Surgeon: Landry Dyke, MD;  Location: [facility];  Service: Endoscopy;  Laterality: N/A;  . EXCISION MORTON'S NEUROMA    . EYE SURGERY Bilateral    Cataract  . FRACTURE SURGERY     ankle x 2   . KNEE SURGERY     x 3  . NECK SURGERY    . POSTERIOR CERVICAL FUSION/FORAMINOTOMY N/A 01/22/2018   Procedure: Cervical six-seven Posterior Decompression with Posterior Fixation Cervical Five to Thoracic Two;  Surgeon: [name], MD;  Location: [facility];  Service: Neurosurgery;  Laterality: N/A;  Cervical 6-7 Posteror decompression with posterior fixation Cervical 5 to Thoracic 2  . ROTATOR CUFF REPAIR    . TONSILLECTOMY    . TOTAL KNEE ARTHROPLASTY Left 04/10/2017   Procedure: LEFT TOTAL KNEE ARTHROPLASTY;  Surgeon: Paralee Cancel, MD;  Location: [facility];  Service: Orthopedics;  Laterality: Left;  70 mins  . UPPER GASTROINTESTINAL ENDOSCOPY    . WRIST SURGERY      Social History:  reports that she has quit smoking. She has never used smokeless tobacco. She reports that she does not drink alcohol or use drugs. Family History:  Family History  Problem Relation Age of Onset  . Heart failure Mother   . Hyperlipidemia Mother   . Lung cancer Father        Lymphoma  . Hyperlipidemia Maternal Grandmother   . Peripheral vascular disease Maternal Grandmother      HOME MEDICATIONS: Allergies as of 10/02/2018      Reactions   Amoxil [amoxicillin] Swelling, Other (See Comments)   Lips Has patient had a PCN reaction causing immediate rash, facial/tongue/throat swelling, SOB or lightheadedness with hypotension:YES Has patient had a PCN reaction causing severe rash involving mucus membranes or skin necrosis:No Has patient had a PCN reaction that required hospitalization:No Has patient had a PCN reaction occurring within the last 10 years:Yes. If all of the above answers are "NO", then may proceed with  Cephalosporin use.   Chocolate Hives   Demerol Nausea And Vomiting   Lisinopril Itching, Swelling   Other    Raw food or nuts - GI pain      Medication List       Accurate as of October 02, 2018  1:17 PM. If you have any questions, ask your nurse or doctor.        acetaminophen 325 MG tablet Commonly known as: TYLENOL Take 325 mg by mouth at bedtime.   aspirin EC 81 MG tablet Take 81 mg by mouth every evening.   Calcium 600+D3 600-200 MG-UNIT Tabs Generic drug: Calcium Carb-Cholecalciferol Take 1 tablet by mouth every evening. Calcium 600 mg -Vitamin D 200 mg   cholecalciferol 1000 units tablet Commonly known as: VITAMIN D Take 10,000 Units by mouth daily. Daily in the am   Cranberry 450 MG Caps Take 2 capsules in AM 2 at bedtime  Dexcom G6 Receiver Devi 1 Device by Does not apply route daily.   Dexcom G6 Sensor Misc 1 kit by Does not apply route as directed.   Dexcom G6 Transmitter Misc 1 kit by Does not apply route as directed.   digoxin 0.125 MG tablet Commonly known as: Lanoxin Take 1 tablet (0.125 mg total) by mouth daily. Patient needs appointment for further refills   DULoxetine 30 MG capsule Commonly known as: Cymbalta Take 1 capsule (30 mg total) by mouth daily.   ezetimibe 10 MG tablet Commonly known as: ZETIA TAKE 1 TABLET BY MOUTH EVERY DAY   fluconazole 150 MG tablet Commonly known as: DIFLUCAN Take 1 tablet (150 mg total) by mouth once a week for 4 doses.   glucose blood test strip Use as instructed to test blood sugars 3 times daily [id number]   insulin glargine 100 UNIT/ML injection Commonly known as: LANTUS Inject 20 Units into the skin daily. Daily in the am   Insulin Pen Needle 32G X 6 MM Misc Commonly known as: BD Pen Needle Micro U/F 4 times a day novolog(3) and lantus9(1)   meloxicam 15 MG tablet Commonly known as: MOBIC Take 1 tablet (15 mg total) by mouth daily. What changed:   when to take this  reasons to take this    metoprolol succinate 25 MG 24 hr tablet Commonly known as: TOPROL-XL Take 1 tablet (25 mg total) by mouth daily. Patient needs to schedule an appointment for an additional refill   nitroGLYCERIN 0.4 MG SL tablet Commonly known as: NITROSTAT Place 0.4 mg under the tongue every 5 (five) minutes as needed for chest pain.   NOVOLOG [place] Inject 8 Units into the skin 3 (three) times daily before meals.   nystatin powder Commonly known as: MYCOSTATIN/NYSTOP Apply topically 3 (three) times daily as needed.   nystatin cream Commonly known as: MYCOSTATIN APPLY 1 APPLICATION TOPICALLY TWICE A DAY AS NEEDED *MIX WITH TRIAMCINOLONE   nystatin-triamcinolone cream Commonly known as: MYCOLOG II Apply 1 application topically 2 (two) times daily as needed.   omeprazole 20 MG capsule Commonly known as: PRILOSEC Take 1 capsule (20 mg total) by mouth daily.   ondansetron 4 MG tablet Commonly known as: Zofran Take 1 tablet (4 mg total) by mouth every 8 (eight) hours as needed for nausea or vomiting.   PRESERVISION AREDS 2 PO Take 2 tablets by mouth daily.   ramipril 10 MG capsule Commonly known as: ALTACE Take 1 capsule (10 mg total) by mouth every evening.   RELION INSULIN SYRINGE 1ML/31G 31G X 5/16" 1 ML Misc Generic drug: Insulin Syringe-Needle U-100 Use as directed   rOPINIRole 1 MG tablet Commonly known as: REQUIP TAKE ONE TABLET BY MOUTH AT BEDTIME.   tamsulosin 0.4 MG Caps capsule Commonly known as: FLOMAX TAKE 1 CAPSULE BY MOUTH EVERY DAY   triamcinolone cream 0.1 % Commonly known as: KENALOG APPLY 1 APPLICATION TOPICALLY TWICE A DAY AS NEEDED *MIX WITH NYSTATIN   TURMERIC PO Take 500 mg by mouth 2 (two) times daily.   verapamil 120 MG tablet Commonly known as: CALAN Take 120 mg by mouth daily as needed (afib).   verapamil 240 MG 24 hr capsule Commonly known as: VERELAN PM Take 1 capsule (240 mg total) by mouth daily.   Vitamin D (Ergocalciferol) 1.25 MG (50000 UT)  Caps capsule Commonly known as: DRISDOL Take by mouth.        OBJECTIVE:   Vital Signs: BP 138/78 (BP Location: Left Arm,  Patient Position: Sitting, Cuff Size: Normal)   Pulse 70   Temp 98.3 F (36.8 C)   Ht 5' 4"  (1.626 m)   Wt 197 lb 9.6 oz (89.6 kg)   SpO2 97%   BMI 33.92 kg/m   Wt Readings from Last 3 Encounters:  10/02/18 197 lb 9.6 oz (89.6 kg)  09/24/18 194 lb 6 oz (88.2 kg)  08/23/18 182 lb (82.6 kg)     Exam: General: Pt appears well and is in NAD  Lungs: Clear with good BS bilat with no rales, rhonchi, or wheezes  Heart: RRR with normal S1 and S2 and no gallops; no murmurs; no rub  Abdomen: Normoactive bowel sounds, soft, nontender, without masses or organomegaly palpable  Extremities: No pretibial edema. No tremor.  Neuro: MS is good with appropriate affect, pt is alert and Ox3    DM foot exam:04/12/2018 The skin of the feet is intact without sores or ulcerations. The pedal pulses are 2+ on right and 2+ on left. The sensation is intact to a screening 5.07, 10 gram monofilament bilaterally            DATA REVIEWED:  Lab Results  Component Value Date   HGBA1C 5.9 (A) 06/21/2018   HGBA1C 6.2 (A) 04/12/2018   HGBA1C 7.6 (H) 11/12/2017   Lab Results  Component Value Date   MICROALBUR 26.1 (H) 01/12/2017   LDLCALC 82 01/20/2016   CREATININE 0.94 08/23/2018   Lab Results  Component Value Date   MICRALBCREAT 14.1 01/12/2017     ASSESSMENT / PLAN / RECOMMENDATIONS:   1) Type 2 Diabetes Mellitus, Optimally Controlled, With  neuropathic complications - Most recent A1c of 6.8 %. Goal A1c < 7.0 %.    - She is very happy with the  Dexcom, in review of her CGM download, she tends to have hyperglycemia post-lunch and supper. She admits to eating cookies with those meals , she has been working on limiting those. - We will adjust her novolog during these meals    MEDICATIONS:  Lantus 20 units once a day  NovoLog 8 units with Breakfast and 10  units with Lunch and Supper    EDUCATION / INSTRUCTIONS:  BG monitoring instructions: Patient is instructed to check her blood sugars 4 times a day, before meals and bedtime.  Call [facility] if: BG persistently < 70 or > 300. . I reviewed the Rule of 15 for the treatment of hypoglycemia in detail with the patient. Literature supplied.    2) Diabetic complications:   Eye: Does not have known diabetic retinopathy.   Neuro/ Feet: Does have known diabetic peripheral neuropathy .   Renal: Patient does not have known baseline CKD. She   is on an ACEI/ARB at present.     F/U in 3 months    signed electronically by: Mack Guise, MD  [facility]  [facility] [address] [place], [place] [address] Phone: [phone] FAX: [phone]   CC: [name], [name], [place] [address] Phone: [phone]  Fax: [phone]  Return to Endocrinology clinic as below: Future Appointments  Date Time Provider [place]  10/02/2018  1:40 PM Turner Baillie, Melanie Crazier, MD [facility] None  10/29/2018  3:40 PM Jerline Pain, MD [facility]  01/27/2019 10:30 AM [name], [name], MD [facility]

## 2018-10-02 NOTE — Patient Instructions (Signed)
-   Continue Lantus 20 units ONCE a day  - Novolog 8 units with Breakfast and 10 units with Lunch and Supper   HOW TO TREAT LOW BLOOD SUGARS (Blood sugar LESS THAN 70 MG/DL)  Please follow the RULE OF 15 for the treatment of hypoglycemia treatment (when your (blood sugars are less than 70 mg/dL)    STEP 1: Take 15 grams of carbohydrates when your blood sugar is low, which includes:   3-4 GLUCOSE TABS  OR  3-4 OZ OF JUICE OR REGULAR SODA OR  ONE TUBE OF GLUCOSE GEL     STEP 2: RECHECK blood sugar in 15 MINUTES STEP 3: If your blood sugar is still low at the 15 minute recheck --> then, go back to STEP 1 and treat AGAIN with another 15 grams of carbohydrates.

## 2018-10-28 DIAGNOSIS — E1149 Type 2 diabetes mellitus with other diabetic neurological complication: Secondary | ICD-10-CM | POA: Diagnosis not present

## 2018-10-29 ENCOUNTER — Ambulatory Visit: Payer: PPO | Admitting: Cardiology

## 2018-10-29 ENCOUNTER — Encounter: Payer: Self-pay | Admitting: Cardiology

## 2018-10-29 ENCOUNTER — Other Ambulatory Visit: Payer: Self-pay

## 2018-10-29 VITALS — BP 136/76 | HR 77 | Ht 64.0 in | Wt 199.4 lb

## 2018-10-29 DIAGNOSIS — I10 Essential (primary) hypertension: Secondary | ICD-10-CM | POA: Diagnosis not present

## 2018-10-29 DIAGNOSIS — E1159 Type 2 diabetes mellitus with other circulatory complications: Secondary | ICD-10-CM | POA: Diagnosis not present

## 2018-10-29 DIAGNOSIS — I471 Supraventricular tachycardia: Secondary | ICD-10-CM

## 2018-10-29 NOTE — Progress Notes (Signed)
Cardiology Office Note:    Date:  10/29/2018   ID:  SAKIYAH SHUR, DOB 08-13-1940, MRN [id number]  PCP:  [name], [name], MD  Cardiologist:  Candee Furbish, MD  Electrophysiologist:  None   Referring MD: [name], [name], MD    History of Present Illness:    [name] is a 78 y.o. female former patient of Dr. Wynonia Lawman is here to establish care.  She has CAD, prior SVT, hypertension hyperlipidemia and type 2 diabetes with neuropathy.  She was last seen by Dr. Wynonia Lawman on 03/09/2017 for preoperative evaluation prior to knee replacement.  Her SVT has been medically managed.  Verapamil.  She also had severe anxiety and diabetes.  Back in 2017 she had a heart catheterization because of significant chest discomfort that thankfully showed no significant coronary artery disease.  She has not had any rapid palpitations recently.  Very rarely she will have a few races.  If she does, she lays down and takes an extra verapamil.  Usually they go away.  Her daughter is here during this visit.  Prior EF 60%.   She told me about her neck fractures, several fusions in the cervical and thoracic region.  Unfortunate contractures.  Spinal cord compression.  She lost her husband in 2018.  Difficult 2 years.  Denies any fevers chills nausea vomiting syncope bleeding  Past Medical History:  Diagnosis Date  . Anemia    during 1st pregnancy  . Anxiety   . Arthritis   . Complication of anesthesia   . Depression   . Diarrhea, functional   . Dysrhythmia    Paroysmal Atrial Tachycardia  . Essential hypertension   . Fibromyalgia   . GERD (gastroesophageal reflux disease)   . H/O acute pancreatitis   . Headache   . History of hiatal hernia   . Hyperlipemia   . Osteoporosis   . PAT (paroxysmal atrial tachycardia) ([place])   . Pneumonia   . PONV (postoperative nausea and vomiting)   . PSVT (paroxysmal supraventricular tachycardia) ([place])   . Reflux   . Restless leg syndrome   . Type 2 diabetes  mellitus (HCC)    Type II  . Vertigo     Past Surgical History:  Procedure Laterality Date  . ABDOMINAL HYSTERECTOMY    . ANKLE SURGERY    . ANTERIOR CERVICAL DECOMP/DISCECTOMY FUSION N/A 11/12/2017   Procedure: Cervical seven to Thoracic one  Anterior cervical decompression/discectomy/fusion with removal of old plate;  Surgeon: [name], MD;  Location: [facility];  Service: Neurosurgery;  Laterality: N/A;  . BACK SURGERY     T12-L1 fusion, Anterior Cervical fusion  . CARDIAC CATHETERIZATION N/A 02/16/2015   Procedure: Left Heart Cath and Coronary Angiography;  Surgeon: Sherren Mocha, MD;  Location: [facility];  Service: Cardiovascular;  Laterality: N/A;  . CHOLECYSTECTOMY    . COLONOSCOPY    . ESOPHAGOGASTRODUODENOSCOPY  12/21/2010   Procedure: ESOPHAGOGASTRODUODENOSCOPY (EGD);  Surgeon: Landry Dyke, MD;  Location: [facility];  Service: Endoscopy;  Laterality: N/A;  . EXCISION MORTON'S NEUROMA    . EYE SURGERY Bilateral    Cataract  . FRACTURE SURGERY     ankle x 2   . KNEE SURGERY     x 3  . NECK SURGERY    . POSTERIOR CERVICAL FUSION/FORAMINOTOMY N/A 01/22/2018   Procedure: Cervical six-seven Posterior Decompression with Posterior Fixation Cervical Five to Thoracic Two;  Surgeon: [name], MD;  Location: [facility];  Service: Neurosurgery;  Laterality: N/A;  Cervical 6-7 Posteror decompression with posterior fixation Cervical 5 to Thoracic 2  . ROTATOR CUFF REPAIR    . TONSILLECTOMY    . TOTAL KNEE ARTHROPLASTY Left 04/10/2017   Procedure: LEFT TOTAL KNEE ARTHROPLASTY;  Surgeon: Paralee Cancel, MD;  Location: [facility];  Service: Orthopedics;  Laterality: Left;  70 mins  . UPPER GASTROINTESTINAL ENDOSCOPY    . WRIST SURGERY      Current Medications: Current Meds  Medication Sig  . acetaminophen (TYLENOL) 325 MG tablet Take 325 mg by mouth at bedtime.   Marland Kitchen aspirin EC 81 MG tablet Take 81 mg by mouth every evening.  . Calcium Carb-Cholecalciferol (CALCIUM 600+D3)  600-200 MG-UNIT TABS Take 1 tablet by mouth every evening. Calcium 600 mg -Vitamin D 200 mg  . cholecalciferol (VITAMIN D) 1000 units tablet Take 10,000 Units by mouth daily. Daily in the am  . Continuous Blood Gluc Receiver (DEXCOM G6 RECEIVER) DEVI 1 Device by Does not apply route daily.  . Continuous Blood Gluc Sensor (DEXCOM G6 SENSOR) MISC 1 kit by Does not apply route as directed.  . Continuous Blood Gluc Transmit (DEXCOM G6 TRANSMITTER) MISC 1 kit by Does not apply route as directed.  . Cranberry 450 MG CAPS Take 2 capsules in AM 2 at bedtime  . digoxin (LANOXIN) 0.125 MG tablet Take 1 tablet (0.125 mg total) by mouth daily. Patient needs appointment for further refills  . DULoxetine (CYMBALTA) 30 MG capsule Take 1 capsule (30 mg total) by mouth daily.  Marland Kitchen ezetimibe (ZETIA) 10 MG tablet TAKE 1 TABLET BY MOUTH EVERY DAY  . glucose blood test strip Use as instructed to test blood sugars 3 times daily [id number]  . insulin aspart (NOVOLOG FLEXPEN) 100 UNIT/ML FlexPen Inject 8 Units into the skin daily with breakfast AND 10 Units daily with lunch AND 10 Units daily with supper.  . Insulin Aspart (NOVOLOG Kief) Inject 8 Units into the skin 3 (three) times daily before meals. 8units at breakfast. 10 units at lunch and dinner.  . insulin glargine (LANTUS) 100 UNIT/ML injection Inject 20 Units into the skin daily. Daily in the am   . Insulin Pen Needle (BD PEN NEEDLE MICRO U/F) 32G X 6 MM MISC 4 times a day novolog(3) and lantus9(1)  . meloxicam (MOBIC) 15 MG tablet Take 15 mg by mouth as needed for pain.  . metoprolol succinate (TOPROL-XL) 25 MG 24 hr tablet Take 1 tablet (25 mg total) by mouth daily. Patient needs to schedule an appointment for an additional refill  . Multiple Vitamins-Minerals (PRESERVISION AREDS 2 PO) Take 2 tablets by mouth daily.  . nitroGLYCERIN (NITROSTAT) 0.4 MG SL tablet Place 0.4 mg under the tongue every 5 (five) minutes as needed for chest pain.   Marland Kitchen nystatin  (MYCOSTATIN/NYSTOP) powder Apply topically 3 (three) times daily as needed.  . nystatin cream (MYCOSTATIN) APPLY 1 APPLICATION TOPICALLY TWICE A DAY AS NEEDED *MIX WITH TRIAMCINOLONE  . nystatin-triamcinolone (MYCOLOG II) cream Apply 1 application topically 2 (two) times daily as needed.  Marland Kitchen omeprazole (PRILOSEC) 20 MG capsule Take 1 capsule (20 mg total) by mouth daily.  . ondansetron (ZOFRAN) 4 MG tablet Take 1 tablet (4 mg total) by mouth every 8 (eight) hours as needed for nausea or vomiting.  . ramipril (ALTACE) 10 MG capsule Take 1 capsule (10 mg total) by mouth every evening.  Marland Kitchen RELION INSULIN SYRINGE 1ML/31G 31G X 5/16" 1 ML MISC Use as directed  . rOPINIRole (REQUIP) 1 MG  tablet TAKE ONE TABLET BY MOUTH AT BEDTIME.  . tamsulosin (FLOMAX) 0.4 MG CAPS capsule TAKE 1 CAPSULE BY MOUTH EVERY DAY  . triamcinolone cream (KENALOG) 0.1 % APPLY 1 APPLICATION TOPICALLY TWICE A DAY AS NEEDED *MIX WITH NYSTATIN  . TURMERIC PO Take 500 mg by mouth 2 (two) times daily.   . verapamil (CALAN) 120 MG tablet Take 120 mg by mouth daily as needed (afib).   . verapamil (VERELAN PM) 240 MG 24 hr capsule Take 1 capsule (240 mg total) by mouth daily.  . Vitamin D, Ergocalciferol, (DRISDOL) 1.25 MG (50000 UT) CAPS capsule Take by mouth.     Allergies:   Amoxil [amoxicillin], Chocolate, Demerol, Lisinopril, and Other   Social History   Socioeconomic History  . Marital status: Widowed    Spouse name: Glendell Docker  . Number of children: 2  . Years of education: Not on file  . Highest education level: Not on file  Occupational History  . Not on file  Social Needs  . Financial resource strain: Not on file  . Food insecurity    Worry: Not on file    Inability: Not on file  . Transportation needs    Medical: Not on file    Non-medical: Not on file  Tobacco Use  . Smoking status: Former Research scientist (life sciences)  . Smokeless tobacco: Never Used  Substance and Sexual Activity  . Alcohol use: No  . Drug use: No  . Sexual  activity: Not on file  Lifestyle  . Physical activity    Days per week: Not on file    Minutes per session: Not on file  . Stress: Not on file  Relationships  . Social Herbalist on phone: Not on file    Gets together: Not on file    Attends religious service: Not on file    Active member of club or organization: Not on file    Attends meetings of clubs or organizations: Not on file    Relationship status: Not on file  Other Topics Concern  . Not on file  Social History Narrative   Lives home alone.  Daughter [name] with her today.   Independent of ADLs.  Education HS.  Widowed.       Family History: The patient's family history includes Heart failure in her mother; Hyperlipidemia in her maternal grandmother and mother; Lung cancer in her father; Peripheral vascular disease in her maternal grandmother.  ROS:   Please see the history of present illness.     All other systems reviewed and are negative.  EKGs/Labs/Other Studies Reviewed:    The following studies were reviewed today: Cath ECHO OK   EKG: 04/15/2018-sinus bradycardia 56 no other abnormalities  Recent Labs: 01/25/2018: Hemoglobin 10.4; Magnesium 2.1; Platelets 167 08/23/2018: BUN 26; Creatinine, Ser 0.94; Potassium 4.3; Sodium 138  Recent Lipid Panel    Component Value Date/Time   CHOL 151 01/20/2016   TRIG 438 (A) 01/20/2016   HDL 27 (A) 01/20/2016   CHOLHDL 5.9 02/15/2015 0514   VLDL 73 (H) 02/15/2015 0514   LDLCALC 82 01/20/2016    Physical Exam:    VS:  BP 136/76   Pulse 77   Ht _0  (1.626 m)   Wt 199 lb 6.4 oz (90.4 kg)   SpO2 96%   BMI 34.23 kg/m     Wt Readings from Last 3 Encounters:  10/29/18 199 lb 6.4 oz (90.4 kg)  10/02/18 197 lb 9.6 oz (89.6 kg)  09/24/18 194 lb 6 oz (88.2 kg)     GEN:  Well nourished, well developed in no acute distress HEENT: Normal NECK: No JVD; No carotid bruits LYMPHATICS: No lymphadenopathy CARDIAC: RRR, no murmurs, rubs, gallops RESPIRATORY:   Clear to auscultation without rales, wheezing or rhonchi  ABDOMEN: Soft, non-tender, non-distended MUSCULOSKELETAL:  No edema; No deformity  SKIN: Warm and dry, right arm splint noted to help prevent contractures.  Left second finger contracted. NEUROLOGIC:  Alert and oriented x 3 PSYCHIATRIC:  Normal affect   ASSESSMENT:    1. SVT (supraventricular tachycardia) ([place])   2. Essential hypertension, benign   3. Type 2 diabetes mellitus with other circulatory complication, unspecified whether long term insulin use (HCC)    PLAN:    In order of problems listed above:  Paroxysmal supraventricular tachycardia -On digoxin, metoprolol, verapamil.  No changes made.  Doing very well.  She is been on this regimen for many years.  Stable.  No CAD normal EF  Neck fracture, contractures -Per Dr. [name].  Diabetes with hypertension - Well controlled by Dr. [name].  Medication Adjustments/Labs and Tests Ordered: Current medicines are reviewed at length with the patient today.  Concerns regarding medicines are outlined above.  No orders of the defined types were placed in this encounter.  No orders of the defined types were placed in this encounter.   Patient Instructions  Medication Instructions:  The current medical regimen is effective;  continue present plan and medications.  If you need a refill on your cardiac medications before your next appointment, please call your pharmacy.   Follow-Up: At [facility], you and your health needs are our priority.  As part of our continuing mission to provide you with exceptional heart care, we have created designated Provider Care Teams.  These Care Teams include your primary Cardiologist (physician) and Advanced Practice Providers (APPs -  Physician Assistants and Nurse Practitioners) who all work together to provide you with the care you need, when you need it. You will need a follow up appointment in 12 months.  Please call our office 2  months in advance to schedule this appointment.  You may see Candee Furbish, MD or one of the following Advanced Practice Providers on your designated Care Team:   Truitt Merle, NP Cecilie Kicks, NP . Kathyrn Drown, NP  Thank you for choosing [facility]!!         Signed, Candee Furbish, MD  10/29/2018 4:44 PM    [facility]

## 2018-10-29 NOTE — Patient Instructions (Signed)
Medication Instructions:  The current medical regimen is effective;  continue present plan and medications.  If you need a refill on your cardiac medications before your next appointment, please call your pharmacy.   Follow-Up: At [facility], you and your health needs are our priority.  As part of our continuing mission to provide you with exceptional heart care, we have created designated Provider Care Teams.  These Care Teams include your primary Cardiologist (physician) and Advanced Practice Providers (APPs -  Physician Assistants and Nurse Practitioners) who all work together to provide you with the care you need, when you need it. You will need a follow up appointment in 12 months.  Please call our office 2 months in advance to schedule this appointment.  You may see Mark Skains, MD or one of the following Advanced Practice Providers on your designated Care Team:   Lori Gerhardt, NP Laura Ingold, NP . Jill McDaniel, NP  Thank you for choosing [facility]!!      

## 2018-10-31 DIAGNOSIS — I10 Essential (primary) hypertension: Secondary | ICD-10-CM | POA: Diagnosis not present

## 2018-10-31 DIAGNOSIS — Z6833 Body mass index (BMI) 33.0-33.9, adult: Secondary | ICD-10-CM | POA: Diagnosis not present

## 2018-10-31 DIAGNOSIS — M5416 Radiculopathy, lumbar region: Secondary | ICD-10-CM | POA: Diagnosis not present

## 2018-10-31 DIAGNOSIS — M545 Low back pain: Secondary | ICD-10-CM | POA: Diagnosis not present

## 2018-11-04 ENCOUNTER — Ambulatory Visit: Payer: Self-pay

## 2018-11-04 ENCOUNTER — Telehealth: Payer: Self-pay | Admitting: Internal Medicine

## 2018-11-04 DIAGNOSIS — M5416 Radiculopathy, lumbar region: Secondary | ICD-10-CM | POA: Diagnosis not present

## 2018-11-04 NOTE — Telephone Encounter (Signed)
Patient's daughter called stating that today patient had a Cortizone injection and now is experiencing hyperglycemia.  She states her Dexcom reads 400. She feels weak. She has taken her insulins as prescribed.  Returned call to daughter [name] at [phone] to advise how to adapt insulin dosage to accommodate for Cortizone

## 2018-11-04 NOTE — Telephone Encounter (Signed)
[facility] called state that pt was triaged for elevated blood glucose of 400, pt was given advise to call her Endocrinologist office to schedule a visit or get advise regarding her BS since Dr [name] is booked for the afternoon.

## 2018-11-04 NOTE — Telephone Encounter (Signed)
Patient called stating that today she had a cortizone injection and now is experiencing hyperglycemia.  She states her dexcom reads 400. She feels weak. She has taken her insulins as prescribed. Call place to office. Returned call to patient and spoke with her daughter.  She states she is with her mother and she will help her to call Dr Kelton Pillar office per request of Dr Doug Sou office.   Reason for Disposition . Blood glucose > 400 mg/dL (22.2 mmol/L)  Answer Assessment - Initial Assessment Questions 1. BLOOD GLUCOSE: "What is your blood glucose level?"      400 2. ONSET: "When did you check the blood glucose?"     today 3. USUAL RANGE: "What is your glucose level usually?" (e.g., usual fasting morning value, usual evening value)    120 morning evening 200 4. KETONES: "Do you check for ketones (urine or blood test strips)?" If yes, ask: "What does the test show now?"    N/A 5. TYPE 1 or 2:  "Do you know what type of diabetes you have?"  (e.g., Type 1, Type 2, Gestational; doesn't know)      Type 2 takes lantus 20 unit daily and novolog 8units 10unit and 10 units 6. INSULIN: "Do you take insulin?" "What type of insulin(s) do you use? What is the mode of delivery? (syringe, pen; injection or pump)?"      Lantus, novolog 7. DIABETES PILLS: "Do you take any pills for your diabetes?" If yes, ask: "Have you missed taking any pills recently?"     no 8. OTHER SYMPTOMS: "Do you have any symptoms?" (e.g., fever, frequent urination, difficulty breathing, dizziness, weakness, vomiting)     weak 9. PREGNANCY: "Is there any chance you are pregnant?" "When was your last menstrual period?"    N/A  Protocols used: DIABETES - HIGH BLOOD SUGAR-A-AH

## 2018-11-04 NOTE — Telephone Encounter (Signed)
Noted. Appropriate for pt to call Endo provider for this concern.

## 2018-11-04 NOTE — Telephone Encounter (Signed)
Please advise 

## 2018-11-04 NOTE — Telephone Encounter (Signed)
Daughter [name] informed of changes, also sent another invite to her e-mail for [employer].

## 2018-11-05 ENCOUNTER — Telehealth: Payer: Self-pay | Admitting: Internal Medicine

## 2018-11-05 NOTE — Telephone Encounter (Signed)
Reports printed and placed on Dr. Quin Hoop desk for review, she stated that no additional changes will be made at this time

## 2018-11-05 NOTE — Telephone Encounter (Signed)
Patients daughter [name] called to advise that they did get the Dexcom connected and they have uploaded the information last night and this morning.  She said to give her a call if anything needs to be changed.

## 2018-11-07 DIAGNOSIS — E119 Type 2 diabetes mellitus without complications: Secondary | ICD-10-CM | POA: Diagnosis not present

## 2018-11-07 DIAGNOSIS — M542 Cervicalgia: Secondary | ICD-10-CM | POA: Diagnosis not present

## 2018-11-07 DIAGNOSIS — M21941 Unspecified acquired deformity of hand, right hand: Secondary | ICD-10-CM | POA: Diagnosis not present

## 2018-11-07 DIAGNOSIS — Z7982 Long term (current) use of aspirin: Secondary | ICD-10-CM | POA: Diagnosis not present

## 2018-11-07 DIAGNOSIS — Z9181 History of falling: Secondary | ICD-10-CM | POA: Diagnosis not present

## 2018-11-07 DIAGNOSIS — M545 Low back pain: Secondary | ICD-10-CM | POA: Diagnosis not present

## 2018-11-07 DIAGNOSIS — M06041 Rheumatoid arthritis without rheumatoid factor, right hand: Secondary | ICD-10-CM | POA: Diagnosis not present

## 2018-11-07 DIAGNOSIS — I10 Essential (primary) hypertension: Secondary | ICD-10-CM | POA: Diagnosis not present

## 2018-11-07 DIAGNOSIS — M5416 Radiculopathy, lumbar region: Secondary | ICD-10-CM | POA: Diagnosis not present

## 2018-11-07 DIAGNOSIS — Z794 Long term (current) use of insulin: Secondary | ICD-10-CM | POA: Diagnosis not present

## 2018-11-07 DIAGNOSIS — G894 Chronic pain syndrome: Secondary | ICD-10-CM | POA: Diagnosis not present

## 2018-11-08 ENCOUNTER — Other Ambulatory Visit: Payer: Self-pay | Admitting: Internal Medicine

## 2018-11-08 ENCOUNTER — Encounter: Payer: Self-pay | Admitting: Family Medicine

## 2018-11-08 ENCOUNTER — Ambulatory Visit (INDEPENDENT_AMBULATORY_CARE_PROVIDER_SITE_OTHER): Payer: PPO | Admitting: Family Medicine

## 2018-11-08 ENCOUNTER — Other Ambulatory Visit: Payer: Self-pay

## 2018-11-08 VITALS — BP 118/68 | HR 76 | Temp 98.3°F | Wt 198.9 lb

## 2018-11-08 DIAGNOSIS — Z23 Encounter for immunization: Secondary | ICD-10-CM

## 2018-11-08 DIAGNOSIS — G5631 Lesion of radial nerve, right upper limb: Secondary | ICD-10-CM | POA: Diagnosis not present

## 2018-11-08 DIAGNOSIS — R300 Dysuria: Secondary | ICD-10-CM

## 2018-11-08 DIAGNOSIS — R200 Anesthesia of skin: Secondary | ICD-10-CM | POA: Diagnosis not present

## 2018-11-08 LAB — POCT URINALYSIS DIPSTICK
Bilirubin, UA: NEGATIVE
Blood, UA: NEGATIVE
Glucose, UA: NEGATIVE
Ketones, UA: NEGATIVE
Leukocytes, UA: NEGATIVE
Nitrite, UA: NEGATIVE
Odor: NEGATIVE
Protein, UA: POSITIVE — AB
Spec Grav, UA: >=1.03 — AB (ref 1.010–1.025)
Urobilinogen, UA: 0.2 E.U./dL
pH, UA: 5.5 (ref 5.0–8.0)

## 2018-11-08 NOTE — Patient Instructions (Signed)
Try to drink more fluids  We will call when culture results back.

## 2018-11-08 NOTE — Progress Notes (Signed)
Subjective:     Patient ID: [name], female   DOB: [birth year], 78 y.o.   MRN: [id number]  HPI Patient seen with frequent urination for about 2 to 3 weeks with some intermittent burning.  No fever.  No chills.  No flank pain.  No gross hematuria.  She has had some nocturia and night before last got up about every 30 minutes though not as consistently last night.  She is allergic to amoxicillin.  Her chronic problems include hypertension, type 2 diabetes, history of fibromyalgia, history of depression.  She has been dealing with complications of C7 fracture this past year  Past Medical History:  Diagnosis Date  . Anemia    during 1st pregnancy  . Anxiety   . Arthritis   . Complication of anesthesia   . Depression   . Diarrhea, functional   . Dysrhythmia    Paroysmal Atrial Tachycardia  . Essential hypertension   . Fibromyalgia   . GERD (gastroesophageal reflux disease)   . H/O acute pancreatitis   . Headache   . History of hiatal hernia   . Hyperlipemia   . Osteoporosis   . PAT (paroxysmal atrial tachycardia) ([place])   . Pneumonia   . PONV (postoperative nausea and vomiting)   . PSVT (paroxysmal supraventricular tachycardia) ([place])   . Reflux   . Restless leg syndrome   . Type 2 diabetes mellitus (HCC)    Type II  . Vertigo    Past Surgical History:  Procedure Laterality Date  . ABDOMINAL HYSTERECTOMY    . ANKLE SURGERY    . ANTERIOR CERVICAL DECOMP/DISCECTOMY FUSION N/A 11/12/2017   Procedure: Cervical seven to Thoracic one  Anterior cervical decompression/discectomy/fusion with removal of old plate;  Surgeon: [name], MD;  Location: [place];  Service: Neurosurgery;  Laterality: N/A;  . BACK SURGERY     T12-L1 fusion, Anterior Cervical fusion  . CARDIAC CATHETERIZATION N/A 02/16/2015   Procedure: Left Heart Cath and Coronary Angiography;  Surgeon: Sherren Mocha, MD;  Location: [place] [facility];  Service: Cardiovascular;  Laterality: N/A;  . CHOLECYSTECTOMY    .  COLONOSCOPY    . ESOPHAGOGASTRODUODENOSCOPY  12/21/2010   Procedure: ESOPHAGOGASTRODUODENOSCOPY (EGD);  Surgeon: Landry Dyke, MD;  Location: [facility];  Service: Endoscopy;  Laterality: N/A;  . EXCISION MORTON'S NEUROMA    . EYE SURGERY Bilateral    Cataract  . FRACTURE SURGERY     ankle x 2   . KNEE SURGERY     x 3  . NECK SURGERY    . POSTERIOR CERVICAL FUSION/FORAMINOTOMY N/A 01/22/2018   Procedure: Cervical six-seven Posterior Decompression with Posterior Fixation Cervical Five to Thoracic Two;  Surgeon: [name], MD;  Location: [place];  Service: Neurosurgery;  Laterality: N/A;  Cervical 6-7 Posteror decompression with posterior fixation Cervical 5 to Thoracic 2  . ROTATOR CUFF REPAIR    . TONSILLECTOMY    . TOTAL KNEE ARTHROPLASTY Left 04/10/2017   Procedure: LEFT TOTAL KNEE ARTHROPLASTY;  Surgeon: Paralee Cancel, MD;  Location: [facility];  Service: Orthopedics;  Laterality: Left;  70 mins  . UPPER GASTROINTESTINAL ENDOSCOPY    . WRIST SURGERY      reports that she has quit smoking. She has never used smokeless tobacco. She reports that she does not drink alcohol or use drugs. family history includes Heart failure in her mother; Hyperlipidemia in her maternal grandmother and mother; Lung cancer in her father; Peripheral vascular disease in her maternal grandmother. Allergies  Allergen Reactions  . Amoxil [Amoxicillin] Swelling and Other (See Comments)    Lips Has patient had a PCN reaction causing immediate rash, facial/tongue/throat swelling, SOB or lightheadedness with hypotension:YES Has patient had a PCN reaction causing severe rash involving mucus membranes or skin necrosis:No Has patient had a PCN reaction that required hospitalization:No Has patient had a PCN reaction occurring within the last 10 years:Yes. If all of the above answers are "NO", then may proceed with Cephalosporin use.   . Chocolate Hives  . Demerol Nausea And Vomiting  . Lisinopril Itching and  Swelling  . Other     Raw food or nuts - GI pain     Review of Systems  Constitutional: Negative for appetite change, chills and fever.  Gastrointestinal: Negative for abdominal pain, constipation, diarrhea, nausea and vomiting.  Genitourinary: Positive for dysuria and frequency. Negative for hematuria.  Musculoskeletal: Negative for back pain.  Neurological: Negative for dizziness.       Objective:   Physical Exam Constitutional:      Appearance: She is well-developed.  HENT:     Head: Normocephalic and atraumatic.  Neck:     Musculoskeletal: Neck supple.     Thyroid: No thyromegaly.  Cardiovascular:     Rate and Rhythm: Normal rate and regular rhythm.     Heart sounds: Normal heart sounds.  Pulmonary:     Breath sounds: Normal breath sounds.  Abdominal:     General: Bowel sounds are normal.     Palpations: Abdomen is soft.     Tenderness: There is no abdominal tenderness.        Assessment:     Dysuria.  Urine dipstick reveals high specific gravity but negative in terms of blood, nitrites, and leukocytes.    Plan:     -Urine culture sent per patient request -Flu vaccine given -We recommended increased hydration. -If culture negative and symptoms persist recommend follow-up with primary to explore other possible etiologies such as atrophic vaginitis  Eulas Post MD [facility] at [facility]

## 2018-11-10 LAB — URINE CULTURE
MICRO NUMBER:: 924220
SPECIMEN QUALITY:: ADEQUATE

## 2018-11-11 ENCOUNTER — Telehealth: Payer: Self-pay | Admitting: Internal Medicine

## 2018-11-11 MED ORDER — NOVOLOG FLEXPEN 100 UNIT/ML ~~LOC~~ SOPN: PEN_INJECTOR | SUBCUTANEOUS | 11 refills | Status: DC

## 2018-11-11 NOTE — Telephone Encounter (Signed)
lft vm to return call 

## 2018-11-11 NOTE — Telephone Encounter (Signed)
Patients daughter is calling in regards to her mothers sugars have been running in the high 300s. Would like to know her insulin can be increased.   Please inform daughter, Thanks

## 2018-11-11 NOTE — Telephone Encounter (Signed)
Please advise 

## 2018-11-11 NOTE — Telephone Encounter (Signed)
Spoke with her daughter Lowella Bandy and went over changes

## 2018-11-11 NOTE — Telephone Encounter (Signed)
Recent readings from [facility] printed and placed on your desk, please advise

## 2018-11-12 MED ORDER — CIPROFLOXACIN HCL 500 MG PO TABS: 500.0000 mg | ORAL_TABLET | Freq: Two times a day (BID) | ORAL | 0 refills | Status: DC

## 2018-11-12 NOTE — Addendum Note (Signed)
Addended by: Rebecca Eaton on: 11/12/2018 04:34 PM   Modules accepted: Orders

## 2018-11-14 DIAGNOSIS — Z7982 Long term (current) use of aspirin: Secondary | ICD-10-CM | POA: Diagnosis not present

## 2018-11-14 DIAGNOSIS — E119 Type 2 diabetes mellitus without complications: Secondary | ICD-10-CM | POA: Diagnosis not present

## 2018-11-14 DIAGNOSIS — I10 Essential (primary) hypertension: Secondary | ICD-10-CM | POA: Diagnosis not present

## 2018-11-14 DIAGNOSIS — M5416 Radiculopathy, lumbar region: Secondary | ICD-10-CM | POA: Diagnosis not present

## 2018-11-14 DIAGNOSIS — M06041 Rheumatoid arthritis without rheumatoid factor, right hand: Secondary | ICD-10-CM | POA: Diagnosis not present

## 2018-11-14 DIAGNOSIS — M545 Low back pain: Secondary | ICD-10-CM | POA: Diagnosis not present

## 2018-11-14 DIAGNOSIS — G894 Chronic pain syndrome: Secondary | ICD-10-CM | POA: Diagnosis not present

## 2018-11-14 DIAGNOSIS — Z9181 History of falling: Secondary | ICD-10-CM | POA: Diagnosis not present

## 2018-11-14 DIAGNOSIS — M542 Cervicalgia: Secondary | ICD-10-CM | POA: Diagnosis not present

## 2018-11-14 DIAGNOSIS — M21941 Unspecified acquired deformity of hand, right hand: Secondary | ICD-10-CM | POA: Diagnosis not present

## 2018-11-14 DIAGNOSIS — Z794 Long term (current) use of insulin: Secondary | ICD-10-CM | POA: Diagnosis not present

## 2018-11-15 DIAGNOSIS — R200 Anesthesia of skin: Secondary | ICD-10-CM | POA: Diagnosis not present

## 2018-11-15 DIAGNOSIS — G5631 Lesion of radial nerve, right upper limb: Secondary | ICD-10-CM | POA: Diagnosis not present

## 2018-11-15 DIAGNOSIS — M25641 Stiffness of right hand, not elsewhere classified: Secondary | ICD-10-CM | POA: Diagnosis not present

## 2018-11-15 DIAGNOSIS — M79644 Pain in right finger(s): Secondary | ICD-10-CM | POA: Diagnosis not present

## 2018-11-27 DIAGNOSIS — E1149 Type 2 diabetes mellitus with other diabetic neurological complication: Secondary | ICD-10-CM | POA: Diagnosis not present

## 2018-12-09 DIAGNOSIS — M48062 Spinal stenosis, lumbar region with neurogenic claudication: Secondary | ICD-10-CM | POA: Diagnosis not present

## 2018-12-09 DIAGNOSIS — Z6833 Body mass index (BMI) 33.0-33.9, adult: Secondary | ICD-10-CM | POA: Diagnosis not present

## 2018-12-09 DIAGNOSIS — I10 Essential (primary) hypertension: Secondary | ICD-10-CM | POA: Diagnosis not present

## 2018-12-20 DIAGNOSIS — M5412 Radiculopathy, cervical region: Secondary | ICD-10-CM | POA: Diagnosis not present

## 2018-12-26 DIAGNOSIS — M5416 Radiculopathy, lumbar region: Secondary | ICD-10-CM | POA: Diagnosis not present

## 2018-12-26 DIAGNOSIS — Z981 Arthrodesis status: Secondary | ICD-10-CM | POA: Diagnosis not present

## 2018-12-26 DIAGNOSIS — I10 Essential (primary) hypertension: Secondary | ICD-10-CM | POA: Diagnosis not present

## 2018-12-26 DIAGNOSIS — M542 Cervicalgia: Secondary | ICD-10-CM | POA: Diagnosis not present

## 2018-12-30 DIAGNOSIS — E1149 Type 2 diabetes mellitus with other diabetic neurological complication: Secondary | ICD-10-CM | POA: Diagnosis not present

## 2018-12-31 DIAGNOSIS — M5416 Radiculopathy, lumbar region: Secondary | ICD-10-CM | POA: Diagnosis not present

## 2018-12-31 DIAGNOSIS — M5116 Intervertebral disc disorders with radiculopathy, lumbar region: Secondary | ICD-10-CM | POA: Diagnosis not present

## 2018-12-31 DIAGNOSIS — M4807 Spinal stenosis, lumbosacral region: Secondary | ICD-10-CM | POA: Diagnosis not present

## 2018-12-31 DIAGNOSIS — M4726 Other spondylosis with radiculopathy, lumbar region: Secondary | ICD-10-CM | POA: Diagnosis not present

## 2019-01-03 ENCOUNTER — Other Ambulatory Visit: Payer: Self-pay

## 2019-01-03 ENCOUNTER — Other Ambulatory Visit: Payer: Self-pay | Admitting: Neurological Surgery

## 2019-01-03 ENCOUNTER — Encounter: Payer: Self-pay | Admitting: Internal Medicine

## 2019-01-03 ENCOUNTER — Ambulatory Visit (INDEPENDENT_AMBULATORY_CARE_PROVIDER_SITE_OTHER): Payer: PPO | Admitting: Internal Medicine

## 2019-01-03 ENCOUNTER — Telehealth: Payer: Self-pay | Admitting: *Deleted

## 2019-01-03 VITALS — BP 98/56 | HR 70

## 2019-01-03 DIAGNOSIS — E1142 Type 2 diabetes mellitus with diabetic polyneuropathy: Secondary | ICD-10-CM | POA: Diagnosis not present

## 2019-01-03 DIAGNOSIS — Z794 Long term (current) use of insulin: Secondary | ICD-10-CM

## 2019-01-03 DIAGNOSIS — M4804 Spinal stenosis, thoracic region: Secondary | ICD-10-CM | POA: Diagnosis not present

## 2019-01-03 LAB — POCT GLYCOSYLATED HEMOGLOBIN (HGB A1C): Hemoglobin A1C: 7.6 % — AB (ref 4.0–5.6)

## 2019-01-03 NOTE — Progress Notes (Signed)
Name: [name]  Age/ Sex: 78 y.o., female   MRN/ DOB: [id number], [birth year]     PCP: [name], [name], MD   Reason for Endocrinology Evaluation: Type 2 diabetes Mellitus  Initial Endocrine Consultative Visit:  12/28/2017    PATIENT IDENTIFIER: [name] is a 78 y.o. female with a past medical history of PAT, T2 DM, and urinary incontinence, Hx of pancreatitis. The patient has followed with Endocrinology clinic since 12/28/2017 for consultative assistance with management of her diabetes.  DIABETIC HISTORY:  Ms. Forrey was diagnosed with T2DM > 10 years ago.  She is intolerant to metformin due to fecal incontinence on 2000 mg a day. Her hemoglobin A1c has ranged from 6.7% in 09/01/2017, peaking at 7.9% in 2011.   SUBJECTIVE:   During the last visit (10/02/2018): Her A1c was 5.8%, we continued lantus and Novolog doses.   Today (01/03/2019): [name] is here for a 3 month follow up for diabetes management. She is with her daughter [name].  She checks her blood sugars multiple  times daily through the Dexcom . The patient has not had hypoglycemic episodes since the last clinic visit. Otherwise, the patient has not required any recent emergency interventions for hypoglycemia and has not had recent hospitalizations secondary to hyper or hypoglycemic episodes.   She is scheduled for another surgery on her spine on 01/10/2019    ROS: As per HPI and as detailed below: Review of Systems  Constitutional: Negative for fever and malaise/fatigue.  Respiratory: Negative.  Negative for cough and shortness of breath.   Cardiovascular: Negative.  Negative for chest pain and palpitations.  Gastrointestinal: Positive for nausea. Negative for diarrhea.  Genitourinary: Negative for frequency.  Skin: Negative.  Rash:    Neurological: Positive for tingling and focal weakness.  Endo/Heme/Allergies: Negative for polydipsia.      HOME DIABETES REGIMEN:  Lantus 20 units BID  Novolog 8  units with breakfast , 10 units with lunch and supper   CONTINUOUS GLUCOSE MONITORING RECORD INTERPRETATION    Dates of Recording: 11/7- 01/03/19  Sensor description:Dexcom G6  Results statistics:   CGM use % of time 100  Average and SD 168/40  Time in range      62 %  % Time Above 180 37  % Time above 250 <1  % Time Below target 0    Glycemic patterns summary: Hyperglycemia  Starts after lunch and continues until supper   Hyperglycemic episodes  Post-lunch   Hypoglycemic episodes occurred N/A  Overnight periods: Stable    DIABETIC COMPLICATIONS: Microvascular complications:   Neuropathy   Denies: retinopathy, nephropathy   Last eye exam: Completed 2019  Macrovascular complications:    Denies: CAD, PVD, CVA   HISTORY:  Past Medical History:  Past Medical History:  Diagnosis Date  . Anemia    during 1st pregnancy  . Anxiety   . Arthritis   . Complication of anesthesia   . Depression   . Diarrhea, functional   . Dysrhythmia    Paroysmal Atrial Tachycardia  . Essential hypertension   . Fibromyalgia   . GERD (gastroesophageal reflux disease)   . H/O acute pancreatitis   . Headache   . History of hiatal hernia   . Hyperlipemia   . Osteoporosis   . PAT (paroxysmal atrial tachycardia) ([place])   . Pneumonia   . PONV (postoperative nausea and vomiting)   . PSVT (paroxysmal supraventricular tachycardia) ([place])   . Reflux   . Restless leg  syndrome   . Type 2 diabetes mellitus (HCC)    Type II  . Vertigo    Past Surgical History:  Past Surgical History:  Procedure Laterality Date  . ABDOMINAL HYSTERECTOMY    . ANKLE SURGERY    . ANTERIOR CERVICAL DECOMP/DISCECTOMY FUSION N/A 11/12/2017   Procedure: Cervical seven to Thoracic one  Anterior cervical decompression/discectomy/fusion with removal of old plate;  Surgeon: [name], MD;  Location: [facility];  Service: Neurosurgery;  Laterality: N/A;  . BACK SURGERY     T12-L1 fusion, Anterior Cervical  fusion  . CARDIAC CATHETERIZATION N/A 02/16/2015   Procedure: Left Heart Cath and Coronary Angiography;  Surgeon: Sherren Mocha, MD;  Location: [facility];  Service: Cardiovascular;  Laterality: N/A;  . CHOLECYSTECTOMY    . COLONOSCOPY    . ESOPHAGOGASTRODUODENOSCOPY  12/21/2010   Procedure: ESOPHAGOGASTRODUODENOSCOPY (EGD);  Surgeon: Landry Dyke, MD;  Location: [facility];  Service: Endoscopy;  Laterality: N/A;  . EXCISION MORTON'S NEUROMA    . EYE SURGERY Bilateral    Cataract  . FRACTURE SURGERY     ankle x 2   . KNEE SURGERY     x 3  . NECK SURGERY    . POSTERIOR CERVICAL FUSION/FORAMINOTOMY N/A 01/22/2018   Procedure: Cervical six-seven Posterior Decompression with Posterior Fixation Cervical Five to Thoracic Two;  Surgeon: [name], MD;  Location: [facility];  Service: Neurosurgery;  Laterality: N/A;  Cervical 6-7 Posteror decompression with posterior fixation Cervical 5 to Thoracic 2  . ROTATOR CUFF REPAIR    . TONSILLECTOMY    . TOTAL KNEE ARTHROPLASTY Left 04/10/2017   Procedure: LEFT TOTAL KNEE ARTHROPLASTY;  Surgeon: Paralee Cancel, MD;  Location: [facility];  Service: Orthopedics;  Laterality: Left;  70 mins  . UPPER GASTROINTESTINAL ENDOSCOPY    . WRIST SURGERY      Social History:  reports that she has quit smoking. She has never used smokeless tobacco. She reports that she does not drink alcohol or use drugs. Family History:  Family History  Problem Relation Age of Onset  . Heart failure Mother   . Hyperlipidemia Mother   . Lung cancer Father        Lymphoma  . Hyperlipidemia Maternal Grandmother   . Peripheral vascular disease Maternal Grandmother      HOME MEDICATIONS: Allergies as of 01/03/2019      Reactions   Amoxil [amoxicillin] Swelling, Other (See Comments)   Lips Has patient had a PCN reaction causing immediate rash, facial/tongue/throat swelling, SOB or lightheadedness with hypotension:YES Has patient had a PCN reaction causing severe rash  involving mucus membranes or skin necrosis:No Has patient had a PCN reaction that required hospitalization:No Has patient had a PCN reaction occurring within the last 10 years:Yes. If all of the above answers are "NO", then may proceed with Cephalosporin use.   Chocolate Hives   Demerol Nausea And Vomiting   Lisinopril Itching, Swelling   Other    Raw food or nuts - GI pain      Medication List       Accurate as of January 03, 2019  4:50 PM. If you have any questions, ask your nurse or doctor.        STOP taking these medications   acetaminophen 325 MG tablet Commonly known as: TYLENOL Stopped by: Dorita Sciara, MD   ciprofloxacin 500 MG tablet Commonly known as: Cipro Stopped by: Dorita Sciara, MD   nystatin-triamcinolone cream Commonly known as: MYCOLOG II  Stopped by: Dorita Sciara, MD     TAKE these medications   aspirin EC 81 MG tablet Take 81 mg by mouth at bedtime.   BD Pen Needle Micro U/F 32G X 6 MM Misc Generic drug: Insulin Pen Needle 4 TIMES A DAY NOVOLOG(3) AND LANTUS9(1)   Calcium 600+D3 600-200 MG-UNIT Tabs Generic drug: Calcium Carb-Cholecalciferol Take 1 tablet by mouth at bedtime.   Cranberry 450 MG Caps Take 900 mg by mouth 2 (two) times daily.   Dexcom G6 Receiver Devi 1 Device by Does not apply route daily.   Dexcom G6 Sensor Misc 1 kit by Does not apply route as directed.   Dexcom G6 Transmitter Misc 1 kit by Does not apply route as directed.   digoxin 0.125 MG tablet Commonly known as: Lanoxin Take 1 tablet (0.125 mg total) by mouth daily. Patient needs appointment for further refills   docusate sodium 100 MG capsule Commonly known as: COLACE Take 100 mg by mouth daily.   DULoxetine 30 MG capsule Commonly known as: Cymbalta Take 1 capsule (30 mg total) by mouth daily. What changed: when to take this   ezetimibe 10 MG tablet Commonly known as: ZETIA TAKE 1 TABLET BY MOUTH EVERY DAY   glucose blood test  strip Use as instructed to test blood sugars 3 times daily DX.E11.42   insulin glargine 100 UNIT/ML injection Commonly known as: LANTUS Inject 20 Units into the skin daily.   meloxicam 15 MG tablet Commonly known as: MOBIC Take 15 mg by mouth daily.   metoprolol succinate 25 MG 24 hr tablet Commonly known as: TOPROL-XL Take 1 tablet (25 mg total) by mouth daily. Patient needs to schedule an appointment for an additional refill   NovoLOG FlexPen 100 UNIT/ML FlexPen Generic drug: insulin aspart Inject 10 Units into the skin daily with breakfast AND 14 Units daily with lunch AND 14 Units daily with supper. What changed: See the new instructions.   nystatin powder Commonly known as: MYCOSTATIN/NYSTOP Apply topically 3 (three) times daily as needed. What changed: Another medication with the same name was removed. Continue taking this medication, and follow the directions you see here. Changed by: Dorita Sciara, MD   omeprazole 20 MG capsule Commonly known as: PRILOSEC Take 1 capsule (20 mg total) by mouth daily.   ondansetron 4 MG tablet Commonly known as: Zofran Take 1 tablet (4 mg total) by mouth every 8 (eight) hours as needed for nausea or vomiting.   oxyCODONE-acetaminophen 5-325 MG tablet Commonly known as: PERCOCET/ROXICET Take 1 tablet by mouth every 6 (six) hours as needed for severe pain.   PRESERVISION AREDS 2 PO Take 1 tablet by mouth 2 (two) times daily.   ramipril 10 MG capsule Commonly known as: ALTACE Take 1 capsule (10 mg total) by mouth every evening.   RELION INSULIN SYRINGE 1ML/31G 31G X 5/16" 1 ML Misc Generic drug: Insulin Syringe-Needle U-100 Use as directed   rOPINIRole 1 MG tablet Commonly known as: REQUIP TAKE ONE TABLET BY MOUTH AT BEDTIME.   tamsulosin 0.4 MG Caps capsule Commonly known as: FLOMAX TAKE 1 CAPSULE BY MOUTH EVERY DAY   Turmeric 500 MG Tabs Take 500 mg by mouth 2 (two) times daily.   verapamil 120 MG tablet  Commonly known as: CALAN Take 120 mg by mouth daily as needed (afib).   verapamil 240 MG 24 hr capsule Commonly known as: VERELAN PM Take 1 capsule (240 mg total) by mouth daily.   VITAMIN D PO  Take 10,000 Units by mouth daily.        OBJECTIVE:   Vital Signs: BP (!) 98/56   Pulse 70   SpO2 98%   Wt Readings from Last 3 Encounters:  11/08/18 198 lb 14.4 oz (90.2 kg)  10/29/18 199 lb 6.4 oz (90.4 kg)  10/02/18 197 lb 9.6 oz (89.6 kg)     Exam: General: Pt appears well and is in NAD  Lungs: Clear with good BS bilat with no rales, rhonchi, or wheezes  Heart: RRR with normal S1 and S2 and no gallops; no murmurs; no rub  Extremities: No pretibial edema.  Neuro: MS is good with appropriate affect, pt is alert and Ox3    DM foot exam:01/03/2019 The skin of the feet is intact without sores or ulcerations. The pedal pulses are 2+ on right and 2+ on left. The sensation is decreased to a screening 5.07, 10 gram monofilament bilaterally     DATA REVIEWED:  Lab Results  Component Value Date   HGBA1C 7.6 (A) 01/03/2019   HGBA1C 6.8 (A) 10/02/2018   HGBA1C 5.9 (A) 06/21/2018   Lab Results  Component Value Date   MICROALBUR 26.1 (H) 01/12/2017   LDLCALC 82 01/20/2016   CREATININE 0.94 08/23/2018   Lab Results  Component Value Date   MICRALBCREAT 14.1 01/12/2017     ASSESSMENT / PLAN / RECOMMENDATIONS:   1) Type 2 Diabetes Mellitus, Sub-Optimally Controlled, With  neuropathic complications - Most recent A1c of 7.6 %. Goal A1c < 7.0 %.   - Pt with somewhat worsening hyperglycemia, this was mostly dietary, daughter had called with BG's of 300 mg/dL,, we had advised her to increase insulin doses but the pt opted to improve dietary intake which has done well in the past 2 weeks based on CGM readings  MEDICATIONS:  Lantus 20 units once a day  NovoLog 8 units with Breakfast and 10 units with Lunch and Supper   CF: (BG- 130/35)    EDUCATION / INSTRUCTIONS:  BG  monitoring instructions: Patient is instructed to check her blood sugars 4 times a day, before meals and bedtime.  Call [facility] if: BG persistently < 70 or > 300. . I reviewed the Rule of 15 for the treatment of hypoglycemia in detail with the patient. Literature supplied.  This visit occurred during the SARS-CoV-2 public health emergency.  Safety protocols were in place, including screening questions prior to the visit, additional usage of staff PPE, and extensive cleaning of exam room while observing appropriate contact time as indicated for disinfecting solutions.    F/U in 3 months    signed electronically by: Mack Guise, MD  [facility]  [facility] [address] [place], [place] [address] Phone: [phone] FAX: [phone]   CC: [name], [name], [place] [address] Phone: [phone]  Fax: [phone]  Return to Endocrinology clinic as below: Future Appointments  Date Time Provider [place]  01/06/2019 10:00 AM [facility]-DAHOC PAT 4 [facility]-SDSC None  01/06/2019 11:20 AM MC-SCREENING MC-SDSC None  01/23/2019  2:45 PM Kirsteins, Luanna Salk, MD CPR-PRMA CPR  01/27/2019 10:30 AM [name], [name], MD [facility]  05/05/2019 10:30 AM Purvi Ruehl, Melanie Crazier, MD LBPC-LBENDO None

## 2019-01-03 NOTE — Patient Instructions (Signed)
-   Continue Lantus 20 units ONCE a day  - Novolog 8 units with Breakfast and 10 units with Lunch and Supper  - Novolog correctional insulin: ADD extra units on insulin to your meal-time Novolog dose if your blood sugars are higher than 165. Use the scale below to help guide you:   Blood sugar before meal Number of units to inject  Less than 165 0 unit  166 - 200 1 units  201 -  235 2 units  236 -  270 3 units  271 -  305 4 units  306 -  340 5 units  341 -  375 6 units       HOW TO TREAT LOW BLOOD SUGARS (Blood sugar LESS THAN 70 MG/DL)  Please follow the RULE OF 15 for the treatment of hypoglycemia treatment (when your (blood sugars are less than 70 mg/dL)    STEP 1: Take 15 grams of carbohydrates when your blood sugar is low, which includes:   3-4 GLUCOSE TABS  OR  3-4 OZ OF JUICE OR REGULAR SODA OR  ONE TUBE OF GLUCOSE GEL     STEP 2: RECHECK blood sugar in 15 MINUTES STEP 3: If your blood sugar is still low at the 15 minute recheck --> then, go back to STEP 1 and treat AGAIN with another 15 grams of carbohydrates.  

## 2019-01-03 NOTE — Telephone Encounter (Signed)
   [facility] Pre-operative Risk Assessment    Request for surgical clearance:  1. What type of surgery is being performed? T10-T11 DECOMPRESSION, FUSION, POSTERIOR LATERAL ARTHRODESIS w/PEDICLE SCREWS   2. When is this surgery scheduled? 01/08/19   3. What type of clearance is required (medical clearance vs. Pharmacy clearance to hold med vs. Both)? MEDICAL  4. Are there any medications that need to be held prior to surgery and how long? ASA   5. Practice name and name of physician performing surgery? [facility] ; DR. [name]   6. What is your office phone number [phone]   7.   What is your office fax number [phone]  8.   Anesthesia type (None, local, MAC, general) ? GENERAL    [name] 01/03/2019, 2:11 PM  _________________________________________________________________   (provider comments below)

## 2019-01-03 NOTE — Telephone Encounter (Signed)
   Primary Cardiologist: Candee Furbish, MD  Chart reviewed as part of pre-operative protocol coverage. Patient was contacted 01/03/2019 in reference to pre-operative risk assessment for pending surgery as outlined below.  [name] was last seen on 10/29/2018 by Dr. Marlou Porch.  Since that day, [name] has done well without chest pain. Last cardiac catheterization in 2017 showed normal coronaries.   Therefore, based on ACC/AHA guidelines, the patient would be at acceptable risk for the planned procedure without further cardiovascular testing.   I will route this recommendation to the requesting party via Epic fax function and remove from pre-op pool.  Please call with questions. She may hold aspirin for 5-7 days prior to the procedure and restart as soon as possible after the procedure.   [place], [place] 01/03/2019, 5:53 PM

## 2019-01-06 ENCOUNTER — Other Ambulatory Visit: Payer: Self-pay

## 2019-01-06 ENCOUNTER — Encounter (HOSPITAL_COMMUNITY): Payer: Self-pay

## 2019-01-06 ENCOUNTER — Encounter (HOSPITAL_COMMUNITY)
Admission: RE | Admit: 2019-01-06 | Discharge: 2019-01-06 | Disposition: A | Discharge status: Home / Self Care | Payer: PPO | Source: Ambulatory Visit

## 2019-01-06 ENCOUNTER — Other Ambulatory Visit (HOSPITAL_COMMUNITY)
Admission: RE | Admit: 2019-01-06 | Discharge: 2019-01-06 | Disposition: A | Discharge status: Home / Self Care | Payer: PPO | Source: Ambulatory Visit | Attending: Neurological Surgery | Admitting: Neurological Surgery

## 2019-01-06 DIAGNOSIS — Z880 Allergy status to penicillin: Secondary | ICD-10-CM | POA: Diagnosis not present

## 2019-01-06 DIAGNOSIS — M1990 Unspecified osteoarthritis, unspecified site: Secondary | ICD-10-CM | POA: Diagnosis present

## 2019-01-06 DIAGNOSIS — G2581 Restless legs syndrome: Secondary | ICD-10-CM | POA: Diagnosis present

## 2019-01-06 DIAGNOSIS — Z888 Allergy status to other drugs, medicaments and biological substances status: Secondary | ICD-10-CM | POA: Diagnosis not present

## 2019-01-06 DIAGNOSIS — M5104 Intervertebral disc disorders with myelopathy, thoracic region: Secondary | ICD-10-CM | POA: Diagnosis present

## 2019-01-06 DIAGNOSIS — Z8349 Family history of other endocrine, nutritional and metabolic diseases: Secondary | ICD-10-CM | POA: Diagnosis not present

## 2019-01-06 DIAGNOSIS — F329 Major depressive disorder, single episode, unspecified: Secondary | ICD-10-CM | POA: Diagnosis present

## 2019-01-06 DIAGNOSIS — I10 Essential (primary) hypertension: Secondary | ICD-10-CM | POA: Diagnosis present

## 2019-01-06 DIAGNOSIS — M810 Age-related osteoporosis without current pathological fracture: Secondary | ICD-10-CM | POA: Diagnosis present

## 2019-01-06 DIAGNOSIS — E785 Hyperlipidemia, unspecified: Secondary | ICD-10-CM | POA: Diagnosis present

## 2019-01-06 DIAGNOSIS — F418 Other specified anxiety disorders: Secondary | ICD-10-CM | POA: Diagnosis not present

## 2019-01-06 DIAGNOSIS — I471 Supraventricular tachycardia: Secondary | ICD-10-CM | POA: Diagnosis present

## 2019-01-06 DIAGNOSIS — G8250 Quadriplegia, unspecified: Secondary | ICD-10-CM | POA: Diagnosis present

## 2019-01-06 DIAGNOSIS — M797 Fibromyalgia: Secondary | ICD-10-CM | POA: Diagnosis present

## 2019-01-06 DIAGNOSIS — M4804 Spinal stenosis, thoracic region: Secondary | ICD-10-CM | POA: Diagnosis present

## 2019-01-06 DIAGNOSIS — G839 Paralytic syndrome, unspecified: Secondary | ICD-10-CM

## 2019-01-06 DIAGNOSIS — F419 Anxiety disorder, unspecified: Secondary | ICD-10-CM | POA: Diagnosis present

## 2019-01-06 DIAGNOSIS — G9520 Unspecified cord compression: Secondary | ICD-10-CM | POA: Diagnosis present

## 2019-01-06 DIAGNOSIS — M549 Dorsalgia, unspecified: Secondary | ICD-10-CM | POA: Diagnosis present

## 2019-01-06 DIAGNOSIS — Z87891 Personal history of nicotine dependence: Secondary | ICD-10-CM | POA: Diagnosis not present

## 2019-01-06 DIAGNOSIS — E1142 Type 2 diabetes mellitus with diabetic polyneuropathy: Secondary | ICD-10-CM | POA: Diagnosis present

## 2019-01-06 DIAGNOSIS — Z01812 Encounter for preprocedural laboratory examination: Secondary | ICD-10-CM | POA: Insufficient documentation

## 2019-01-06 DIAGNOSIS — K219 Gastro-esophageal reflux disease without esophagitis: Secondary | ICD-10-CM | POA: Diagnosis present

## 2019-01-06 DIAGNOSIS — M4324 Fusion of spine, thoracic region: Secondary | ICD-10-CM | POA: Diagnosis not present

## 2019-01-06 DIAGNOSIS — Z8249 Family history of ischemic heart disease and other diseases of the circulatory system: Secondary | ICD-10-CM | POA: Diagnosis not present

## 2019-01-06 DIAGNOSIS — G9600 Cerebrospinal fluid leak, unspecified: Secondary | ICD-10-CM | POA: Diagnosis not present

## 2019-01-06 DIAGNOSIS — Z90710 Acquired absence of both cervix and uterus: Secondary | ICD-10-CM | POA: Diagnosis not present

## 2019-01-06 DIAGNOSIS — E119 Type 2 diabetes mellitus without complications: Secondary | ICD-10-CM | POA: Insufficient documentation

## 2019-01-06 DIAGNOSIS — M532X4 Spinal instabilities, thoracic region: Secondary | ICD-10-CM | POA: Diagnosis not present

## 2019-01-06 DIAGNOSIS — Z20828 Contact with and (suspected) exposure to other viral communicable diseases: Secondary | ICD-10-CM | POA: Diagnosis present

## 2019-01-06 DIAGNOSIS — F410 Panic disorder [episodic paroxysmal anxiety] without agoraphobia: Secondary | ICD-10-CM | POA: Diagnosis present

## 2019-01-06 HISTORY — DX: Malignant (primary) neoplasm, unspecified: C80.1

## 2019-01-06 HISTORY — DX: Polyneuropathy, unspecified: G62.9

## 2019-01-06 HISTORY — DX: Panic disorder (episodic paroxysmal anxiety): F41.0

## 2019-01-06 HISTORY — DX: Paralytic syndrome, unspecified: G83.9

## 2019-01-06 HISTORY — DX: Unspecified cord compression: G95.20

## 2019-01-06 LAB — SURGICAL PCR SCREEN
MRSA, PCR: NEGATIVE
Staphylococcus aureus: NEGATIVE

## 2019-01-06 LAB — CBC
HCT: 42.2 % (ref 36.0–46.0)
Hemoglobin: 14.3 g/dL (ref 12.0–15.0)
MCH: 30.6 pg (ref 26.0–34.0)
MCHC: 33.9 g/dL (ref 30.0–36.0)
MCV: 90.2 fL (ref 80.0–100.0)
Platelets: 212 10*3/uL (ref 150–400)
RBC: 4.68 MIL/uL (ref 3.87–5.11)
RDW: 13.5 % (ref 11.5–15.5)
WBC: 10.2 10*3/uL (ref 4.0–10.5)
nRBC: 0 % (ref 0.0–0.2)

## 2019-01-06 LAB — SARS CORONAVIRUS 2 (TAT 6-24 HRS): SARS Coronavirus 2: NEGATIVE

## 2019-01-06 LAB — GLUCOSE, CAPILLARY: Glucose-Capillary: 172 mg/dL — ABNORMAL HIGH (ref 70–99)

## 2019-01-06 NOTE — Pre-Procedure Instructions (Signed)
[name]  01/06/2019    Your procedure is scheduled on Wednesday, November 25  Report to [facility], Main Entrance or Entrance "A" at 12:30 A.M.   Call this number if you have problems the morning of surgery: [phone]  This is the number for the Pre- Surgical Desk.                     For any other questions, please call [phone], Monday - Friday 8 AM - 4 PM.   Remember:  Do not eat or drink after midnight Tuesday, November 24.   Take these medicines the morning of surgery with A SIP OF WATER:  digoxin (LANOXIN)             ezetimibe (ZETIA)              omeprazole (PRILOSEC)              metoprolol succinate (TOPROL-XL)             tamsulosin (FLOMAX)  Take if needed: verapamil (CALAN)  oxyCODONE-acetaminophen (PERCOCET/ROXICET)   Follow your Surgeon's instructions regarding Aspirin.   STOP  Aspirin Products (Goody Powder, Excedrin Migraine), Ibuprofen (Advil), Naproxen (Aleve), MOBIC Vitamins and Herbal Products (ie Fish Oil).   WHAT DO I DO ABOUT MY DIABETES MEDICATION? Marland Kitchen     . THE MORNING OF SURGERY, take 10 units of ____Lantus______insulin.  How to Manage Your Diabetes Before and After Surgery  Why is it important to control my blood sugar before and after surgery? . Improving blood sugar levels before and after surgery helps healing and can limit problems. . A way of improving blood sugar control is eating a healthy diet by: o  Eating less sugar and carbohydrates o  Increasing activity/exercise o  Talking with your doctor about reaching your blood sugar goals . High blood sugars (greater than 180 mg/dL) can raise your risk of infections and slow your recovery, so you will need to focus on controlling your diabetes during the weeks before surgery. . Make sure that the doctor who takes care of your diabetes knows about your planned surgery including the date and location.  How do I manage my blood sugar before surgery? . Check your  blood sugar at least 4 times a day, starting 2 days before surgery, to make sure that the level is not too high or low. o Check your blood sugar the morning of your surgery when you wake up and every 2 hours until you get to the Short Stay unit. . If your blood sugar is less than 70 mg/dL, you will need to treat for low blood sugar: o Do not take insulin. o Treat a low blood sugar (less than 70 mg/dL) with  cup of clear juice (cranberry or apple), 4 glucose tablets, OR glucose gel. Recheck blood sugar in 15 minutes after treatment (to make sure it is greater than 70 mg/dL). If your blood sugar is not greater than 70 mg/dL on recheck, call [phone] o  for further instructions. . Report your blood sugar to the short stay nurse when you get to Short Stay.  . If you are admitted to the hospital after surgery: o Your blood sugar will be checked by the staff and you will probably be given insulin after surgery (instead of oral diabetes medicines) to make sure you have good blood sugar levels. o The goal for blood sugar control after surgery is 80-180 mg/dL.  Special instructions:   [facility]- Preparing For Surgery  Before surgery, you can play an important role. Because skin is not sterile, your skin needs to be as free of germs as possible. You can reduce the number of germs on your skin by washing with CHG (chlorahexidine gluconate) Soap before surgery.  CHG is an antiseptic cleaner which kills germs and bonds with the skin to continue killing germs even after washing.    Oral Hygiene is also important to reduce your risk of infection.  Remember - BRUSH YOUR TEETH THE MORNING OF SURGERY WITH YOUR REGULAR TOOTHPASTE  Please do not use if you have an allergy to CHG or antibacterial soaps. If your skin becomes reddened/irritated stop using the CHG.  Do not shave (including legs and underarms) for at least 48 hours prior to first CHG shower. It is OK to shave your face.  Please follow these  instructions carefully.   1. Shower the NIGHT BEFORE SURGERY and the MORNING OF SURGERY with CHG.   2. If you chose to wash your hair, wash your hair first as usual with your normal shampoo.  3. After you shampoo, wash your face and private area with the soap you use at home, then rinse your hair and body thoroughly to remove the shampoo and soap.  4. Use CHG as you would any other liquid soap. You can apply CHG directly to the skin and wash gently with a scrungie or a clean washcloth.   5. Apply the CHG Soap to your body ONLY FROM THE NECK DOWN.  Do not use on open wounds or open sores. Avoid contact with your eyes, ears, mouth and genitals (private parts).   6. Wash thoroughly, paying special attention to the area where your surgery will be performed.  7. Thoroughly rinse your body with warm water from the neck down.  8. DO NOT shower/wash with your normal soap after using and rinsing off the CHG Soap.  9. Pat yourself dry with a CLEAN TOWEL.  10. Wear CLEAN PAJAMAS to bed the night before surgery, wear comfortable clothes the morning of surgery  11. Place CLEAN SHEETS on your bed the night of your first shower and DO NOT SLEEP WITH PETS.  Day of Surgery: Do not wear lotions, powders, or perfumes, or deodorant. Please wear clean clothes to the hospital/surgery center.   Remember to brush your teeth WITH YOUR REGULAR TOOTHPASTE.   Do not wear jewelry, make-up or nail polish.  Do not wear lotions, powders, or perfumes, or deodorant.  Do not shave 48 hours prior to surgery.  Men may shave face and neck.  Do not bring valuables to the hospital.  [facility] is not responsible for any belongings or valuables.  Contacts, dentures or bridgework may not be worn into surgery.  Leave your suitcase in the car.  After surgery it may be brought to your room.  For patients admitted to the hospital, discharge time will be determined by your treatment team.  Patients discharged the day of  surgery will not be allowed to drive home.   Please read over the following fact sheets that you were given: Pain Booklet, Coughing and Deep Breathing, Surgical Site Infections.   o

## 2019-01-06 NOTE — Progress Notes (Addendum)
PCP - Dr [name]  Cardiologist - Dr. Luther Parody  Endocrinologist Dr. Leone Payor  Chest x-ray - NA  EKG - 05/27/2018  Stress Test - 2014  ECHO - 2019  Cardiac Cath - 2017  Sleep Study - no  CPAP - no  LABS-CBC, BMP, PCR ASA-stopped 02/25/2018  ERAS-no  HA1C-7.6 12/27/2018 - Endocrinologist office Fasting Blood Sugar - 140 Checks Blood Sugar ____3_ times a day  Anesthesia-  Pt denies having chest pain, sob, or fever at this time. All instructions explained to the pt, with a verbal understanding of the material. Pt agrees to go over the instructions while at home for a better understanding. Pt also instructed to self quarantine after being tested for COVID-19. The opportunity to ask questions was provided.   [name] said she gets sick if she goes all morning without eating, I informed her that I'll have to be in touch with the anesthesiologist and call her later.  I spoke with Dr Therisa Doyne, who said patient may have plain toast, tea, apple juice prior to 6:30 AM.  I called and spoke with [name]'s daughter who is a=staying at night with patient, she verified information.

## 2019-01-08 ENCOUNTER — Inpatient Hospital Stay (HOSPITAL_COMMUNITY): Payer: PPO | Admitting: Anesthesiology

## 2019-01-08 ENCOUNTER — Other Ambulatory Visit: Payer: Self-pay

## 2019-01-08 ENCOUNTER — Inpatient Hospital Stay (HOSPITAL_COMMUNITY)
Admission: RE | Admit: 2019-01-08 | Discharge: 2019-01-10 | DRG: 459 | Disposition: A | Discharge status: Home Health | Payer: PPO | Attending: Neurological Surgery | Admitting: Neurological Surgery

## 2019-01-08 ENCOUNTER — Encounter (HOSPITAL_COMMUNITY): Payer: Self-pay | Admitting: General Practice

## 2019-01-08 ENCOUNTER — Encounter (HOSPITAL_COMMUNITY)
Admission: RE | Disposition: A | Discharge status: Home / Self Care | Payer: Self-pay | Source: Home / Self Care | Attending: Neurological Surgery

## 2019-01-08 ENCOUNTER — Inpatient Hospital Stay (HOSPITAL_COMMUNITY): Payer: PPO

## 2019-01-08 DIAGNOSIS — Z79899 Other long term (current) drug therapy: Secondary | ICD-10-CM

## 2019-01-08 DIAGNOSIS — M549 Dorsalgia, unspecified: Secondary | ICD-10-CM | POA: Diagnosis present

## 2019-01-08 DIAGNOSIS — G2581 Restless legs syndrome: Secondary | ICD-10-CM | POA: Diagnosis present

## 2019-01-08 DIAGNOSIS — Z90710 Acquired absence of both cervix and uterus: Secondary | ICD-10-CM

## 2019-01-08 DIAGNOSIS — G9600 Cerebrospinal fluid leak, unspecified: Secondary | ICD-10-CM | POA: Diagnosis not present

## 2019-01-08 DIAGNOSIS — E785 Hyperlipidemia, unspecified: Secondary | ICD-10-CM | POA: Diagnosis present

## 2019-01-08 DIAGNOSIS — Z794 Long term (current) use of insulin: Secondary | ICD-10-CM

## 2019-01-08 DIAGNOSIS — Z7982 Long term (current) use of aspirin: Secondary | ICD-10-CM

## 2019-01-08 DIAGNOSIS — Z8249 Family history of ischemic heart disease and other diseases of the circulatory system: Secondary | ICD-10-CM

## 2019-01-08 DIAGNOSIS — Z791 Long term (current) use of non-steroidal anti-inflammatories (NSAID): Secondary | ICD-10-CM

## 2019-01-08 DIAGNOSIS — G8250 Quadriplegia, unspecified: Secondary | ICD-10-CM | POA: Diagnosis present

## 2019-01-08 DIAGNOSIS — E1142 Type 2 diabetes mellitus with diabetic polyneuropathy: Secondary | ICD-10-CM | POA: Diagnosis present

## 2019-01-08 DIAGNOSIS — M4324 Fusion of spine, thoracic region: Secondary | ICD-10-CM | POA: Diagnosis not present

## 2019-01-08 DIAGNOSIS — M797 Fibromyalgia: Secondary | ICD-10-CM | POA: Diagnosis present

## 2019-01-08 DIAGNOSIS — F419 Anxiety disorder, unspecified: Secondary | ICD-10-CM | POA: Diagnosis present

## 2019-01-08 DIAGNOSIS — F329 Major depressive disorder, single episode, unspecified: Secondary | ICD-10-CM | POA: Diagnosis present

## 2019-01-08 DIAGNOSIS — I10 Essential (primary) hypertension: Secondary | ICD-10-CM | POA: Diagnosis present

## 2019-01-08 DIAGNOSIS — M1990 Unspecified osteoarthritis, unspecified site: Secondary | ICD-10-CM | POA: Diagnosis present

## 2019-01-08 DIAGNOSIS — Z888 Allergy status to other drugs, medicaments and biological substances status: Secondary | ICD-10-CM | POA: Diagnosis not present

## 2019-01-08 DIAGNOSIS — M4714 Other spondylosis with myelopathy, thoracic region: Secondary | ICD-10-CM | POA: Diagnosis present

## 2019-01-08 DIAGNOSIS — Z20828 Contact with and (suspected) exposure to other viral communicable diseases: Secondary | ICD-10-CM | POA: Diagnosis present

## 2019-01-08 DIAGNOSIS — F410 Panic disorder [episodic paroxysmal anxiety] without agoraphobia: Secondary | ICD-10-CM | POA: Diagnosis present

## 2019-01-08 DIAGNOSIS — Z880 Allergy status to penicillin: Secondary | ICD-10-CM

## 2019-01-08 DIAGNOSIS — M4804 Spinal stenosis, thoracic region: Secondary | ICD-10-CM | POA: Diagnosis present

## 2019-01-08 DIAGNOSIS — M810 Age-related osteoporosis without current pathological fracture: Secondary | ICD-10-CM | POA: Diagnosis present

## 2019-01-08 DIAGNOSIS — Z87891 Personal history of nicotine dependence: Secondary | ICD-10-CM

## 2019-01-08 DIAGNOSIS — I471 Supraventricular tachycardia: Secondary | ICD-10-CM | POA: Diagnosis present

## 2019-01-08 DIAGNOSIS — K219 Gastro-esophageal reflux disease without esophagitis: Secondary | ICD-10-CM | POA: Diagnosis present

## 2019-01-08 DIAGNOSIS — Z8349 Family history of other endocrine, nutritional and metabolic diseases: Secondary | ICD-10-CM

## 2019-01-08 DIAGNOSIS — M5104 Intervertebral disc disorders with myelopathy, thoracic region: Secondary | ICD-10-CM | POA: Diagnosis present

## 2019-01-08 DIAGNOSIS — G9520 Unspecified cord compression: Secondary | ICD-10-CM | POA: Diagnosis present

## 2019-01-08 DIAGNOSIS — Z419 Encounter for procedure for purposes other than remedying health state, unspecified: Secondary | ICD-10-CM

## 2019-01-08 DIAGNOSIS — Z91018 Allergy to other foods: Secondary | ICD-10-CM

## 2019-01-08 LAB — BASIC METABOLIC PANEL
Anion gap: 9 (ref 5–15)
BUN: 18 mg/dL (ref 8–23)
CO2: 23 mmol/L (ref 22–32)
Calcium: 9.4 mg/dL (ref 8.9–10.3)
Chloride: 103 mmol/L (ref 98–111)
Creatinine, Ser: 0.89 mg/dL (ref 0.44–1.00)
GFR calc Af Amer: >60 mL/min (ref >60)
GFR calc non Af Amer: >60 mL/min (ref >60)
Glucose, Bld: 151 mg/dL — ABNORMAL HIGH (ref 70–99)
Potassium: 4.4 mmol/L (ref 3.5–5.1)
Sodium: 135 mmol/L (ref 135–145)

## 2019-01-08 LAB — GLUCOSE, CAPILLARY
Glucose-Capillary: 146 mg/dL — ABNORMAL HIGH (ref 70–99)
Glucose-Capillary: 153 mg/dL — ABNORMAL HIGH (ref 70–99)
Glucose-Capillary: 145 mg/dL — ABNORMAL HIGH (ref 70–99)
Glucose-Capillary: 126 mg/dL — ABNORMAL HIGH (ref 70–99)

## 2019-01-08 SURGERY — POSTERIOR LUMBAR FUSION 1 LEVEL
Anesthesia: General | Site: Back

## 2019-01-08 MED ORDER — POLYETHYLENE GLYCOL 3350 17 G PO PACK: 17.0000 g | PACK | Freq: Every day | ORAL | Status: DC | PRN

## 2019-01-08 MED ORDER — ONDANSETRON HCL 4 MG/2ML IJ SOLN
INTRAMUSCULAR | Status: DC | PRN
  Administered 2019-01-08: 4 mg via INTRAVENOUS

## 2019-01-08 MED ORDER — DOCUSATE SODIUM 100 MG PO CAPS
100.0000 mg | ORAL_CAPSULE | Freq: Every day | ORAL | Status: DC
  Administered 2019-01-09: 100 mg via ORAL
  Filled 2019-01-08 (×2): qty 1

## 2019-01-08 MED ORDER — RAMIPRIL 5 MG PO CAPS
10.0000 mg | ORAL_CAPSULE | Freq: Every evening | ORAL | Status: DC
  Administered 2019-01-08 – 2019-01-09 (×2): 10 mg via ORAL
  Filled 2019-01-08 (×2): qty 2

## 2019-01-08 MED ORDER — SODIUM CHLORIDE 0.9 % IV SOLN
250.0000 mL | INTRAVENOUS | Status: DC
  Administered 2019-01-08: 250 mL via INTRAVENOUS

## 2019-01-08 MED ORDER — THROMBIN 20000 UNITS EX SOLR
CUTANEOUS | Status: AC
  Filled 2019-01-08: qty 20000

## 2019-01-08 MED ORDER — DOCUSATE SODIUM 100 MG PO CAPS
100.0000 mg | ORAL_CAPSULE | Freq: Two times a day (BID) | ORAL | Status: DC
  Administered 2019-01-08 – 2019-01-10 (×4): 100 mg via ORAL
  Filled 2019-01-08 (×2): qty 1

## 2019-01-08 MED ORDER — FENTANYL CITRATE (PF) 100 MCG/2ML IJ SOLN
25.0000 ug | INTRAMUSCULAR | Status: DC | PRN
  Administered 2019-01-08: 50 ug via INTRAVENOUS

## 2019-01-08 MED ORDER — DEXCOM G6 TRANSMITTER MISC: 1.0000 | Status: DC

## 2019-01-08 MED ORDER — FENTANYL CITRATE (PF) 100 MCG/2ML IJ SOLN
INTRAMUSCULAR | Status: DC | PRN
  Administered 2019-01-08 (×2): 50 ug via INTRAVENOUS
  Administered 2019-01-08: 100 ug via INTRAVENOUS
  Administered 2019-01-08: 50 ug via INTRAVENOUS

## 2019-01-08 MED ORDER — BUPIVACAINE HCL (PF) 0.5 % IJ SOLN
INTRAMUSCULAR | Status: AC
  Filled 2019-01-08: qty 30

## 2019-01-08 MED ORDER — ONDANSETRON HCL 4 MG/2ML IJ SOLN: 4.0000 mg | Freq: Four times a day (QID) | INTRAMUSCULAR | Status: DC | PRN

## 2019-01-08 MED ORDER — SENNA 8.6 MG PO TABS
1.0000 | ORAL_TABLET | Freq: Two times a day (BID) | ORAL | Status: DC
  Administered 2019-01-08 – 2019-01-10 (×4): 8.6 mg via ORAL
  Filled 2019-01-08 (×4): qty 1

## 2019-01-08 MED ORDER — DEXCOM G6 SENSOR MISC: 1.0000 | Status: DC

## 2019-01-08 MED ORDER — VERAPAMIL HCL ER 240 MG PO TBCR
240.0000 mg | EXTENDED_RELEASE_TABLET | Freq: Every day | ORAL | Status: DC
  Administered 2019-01-09 – 2019-01-10 (×2): 240 mg via ORAL
  Filled 2019-01-08 (×2): qty 1

## 2019-01-08 MED ORDER — ROPINIROLE HCL 1 MG PO TABS
1.0000 mg | ORAL_TABLET | Freq: Every day | ORAL | Status: DC
  Administered 2019-01-08 – 2019-01-09 (×2): 1 mg via ORAL
  Filled 2019-01-08 (×2): qty 1

## 2019-01-08 MED ORDER — ALUM & MAG HYDROXIDE-SIMETH 200-200-20 MG/5ML PO SUSP: 30.0000 mL | Freq: Four times a day (QID) | ORAL | Status: DC | PRN

## 2019-01-08 MED ORDER — EPHEDRINE SULFATE 50 MG/ML IJ SOLN
INTRAMUSCULAR | Status: DC | PRN
  Administered 2019-01-08 (×2): 10 mg via INTRAVENOUS

## 2019-01-08 MED ORDER — ONDANSETRON HCL 4 MG/2ML IJ SOLN
INTRAMUSCULAR | Status: AC
  Filled 2019-01-08: qty 2

## 2019-01-08 MED ORDER — DEXAMETHASONE SODIUM PHOSPHATE 10 MG/ML IJ SOLN
INTRAMUSCULAR | Status: DC | PRN
  Administered 2019-01-08: 10 mg via INTRAVENOUS

## 2019-01-08 MED ORDER — SUCCINYLCHOLINE CHLORIDE 200 MG/10ML IV SOSY
PREFILLED_SYRINGE | INTRAVENOUS | Status: AC
  Filled 2019-01-08: qty 10

## 2019-01-08 MED ORDER — OXYCODONE-ACETAMINOPHEN 5-325 MG PO TABS
1.0000 | ORAL_TABLET | Freq: Four times a day (QID) | ORAL | Status: DC | PRN
  Administered 2019-01-09 – 2019-01-10 (×2): 1 via ORAL
  Filled 2019-01-08: qty 1

## 2019-01-08 MED ORDER — VANCOMYCIN HCL IN DEXTROSE 1-5 GM/200ML-% IV SOLN
1000.0000 mg | INTRAVENOUS | Status: AC
  Administered 2019-01-08: 14:00:00 1000 mg via INTRAVENOUS

## 2019-01-08 MED ORDER — ACETAMINOPHEN 650 MG RE SUPP: 650.0000 mg | RECTAL | Status: DC | PRN

## 2019-01-08 MED ORDER — VANCOMYCIN HCL IN DEXTROSE 1-5 GM/200ML-% IV SOLN
INTRAVENOUS | Status: AC
  Administered 2019-01-08: 1000 mg via INTRAVENOUS
  Filled 2019-01-08: qty 200

## 2019-01-08 MED ORDER — FENTANYL CITRATE (PF) 250 MCG/5ML IJ SOLN
INTRAMUSCULAR | Status: AC
  Filled 2019-01-08: qty 5

## 2019-01-08 MED ORDER — LACTATED RINGERS IV SOLN
INTRAVENOUS | Status: DC
  Administered 2019-01-08 (×2): via INTRAVENOUS

## 2019-01-08 MED ORDER — TAMSULOSIN HCL 0.4 MG PO CAPS
0.4000 mg | ORAL_CAPSULE | Freq: Every day | ORAL | Status: DC
  Administered 2019-01-09 – 2019-01-10 (×2): 0.4 mg via ORAL
  Filled 2019-01-08 (×2): qty 1

## 2019-01-08 MED ORDER — EZETIMIBE 10 MG PO TABS
10.0000 mg | ORAL_TABLET | Freq: Every day | ORAL | Status: DC
  Administered 2019-01-09 – 2019-01-10 (×2): 10 mg via ORAL
  Filled 2019-01-08 (×2): qty 1

## 2019-01-08 MED ORDER — VERAPAMIL HCL 120 MG PO TABS: 120.0000 mg | ORAL_TABLET | Freq: Every day | ORAL | Status: DC | PRN

## 2019-01-08 MED ORDER — ONDANSETRON HCL 4 MG PO TABS: 4.0000 mg | ORAL_TABLET | Freq: Four times a day (QID) | ORAL | Status: DC | PRN

## 2019-01-08 MED ORDER — SODIUM CHLORIDE 0.9% FLUSH
3.0000 mL | Freq: Two times a day (BID) | INTRAVENOUS | Status: DC
  Administered 2019-01-08 – 2019-01-10 (×2): 3 mL via INTRAVENOUS

## 2019-01-08 MED ORDER — THROMBIN 5000 UNITS EX SOLR
CUTANEOUS | Status: AC
  Filled 2019-01-08: qty 5000

## 2019-01-08 MED ORDER — BISACODYL 10 MG RE SUPP: 10.0000 mg | Freq: Every day | RECTAL | Status: DC | PRN

## 2019-01-08 MED ORDER — LIDOCAINE 2% (20 MG/ML) 5 ML SYRINGE
INTRAMUSCULAR | Status: AC
  Filled 2019-01-08: qty 5

## 2019-01-08 MED ORDER — FENTANYL CITRATE (PF) 100 MCG/2ML IJ SOLN
INTRAMUSCULAR | Status: AC
  Filled 2019-01-08: qty 2

## 2019-01-08 MED ORDER — METHOCARBAMOL 1000 MG/10ML IJ SOLN
500.0000 mg | Freq: Four times a day (QID) | INTRAVENOUS | Status: DC | PRN
  Filled 2019-01-08: qty 5

## 2019-01-08 MED ORDER — SODIUM CHLORIDE 0.9 % IV SOLN
INTRAVENOUS | Status: DC | PRN
  Administered 2019-01-08: 17:00:00

## 2019-01-08 MED ORDER — LIDOCAINE HCL (CARDIAC) PF 100 MG/5ML IV SOSY
PREFILLED_SYRINGE | INTRAVENOUS | Status: DC | PRN
  Administered 2019-01-08: 60 mg via INTRAVENOUS

## 2019-01-08 MED ORDER — CHLORHEXIDINE GLUCONATE CLOTH 2 % EX PADS: 6.0000 | MEDICATED_PAD | Freq: Once | CUTANEOUS | Status: DC

## 2019-01-08 MED ORDER — 0.9 % SODIUM CHLORIDE (POUR BTL) OPTIME
TOPICAL | Status: DC | PRN
  Administered 2019-01-08: 1000 mL

## 2019-01-08 MED ORDER — FLEET ENEMA 7-19 GM/118ML RE ENEM: 1.0000 | ENEMA | Freq: Once | RECTAL | Status: DC | PRN

## 2019-01-08 MED ORDER — BUPIVACAINE HCL (PF) 0.5 % IJ SOLN
INTRAMUSCULAR | Status: DC | PRN
  Administered 2019-01-08: 20 mL
  Administered 2019-01-08: 5 mL

## 2019-01-08 MED ORDER — DIGOXIN 125 MCG PO TABS
0.1250 mg | ORAL_TABLET | Freq: Every day | ORAL | Status: DC
  Administered 2019-01-09 – 2019-01-10 (×2): 0.125 mg via ORAL
  Filled 2019-01-08 (×2): qty 1

## 2019-01-08 MED ORDER — DULOXETINE HCL 30 MG PO CPEP
30.0000 mg | ORAL_CAPSULE | Freq: Every day | ORAL | Status: DC
  Administered 2019-01-08 – 2019-01-09 (×2): 30 mg via ORAL
  Filled 2019-01-08 (×2): qty 1

## 2019-01-08 MED ORDER — PHENOL 1.4 % MT LIQD: 1.0000 | OROMUCOSAL | Status: DC | PRN

## 2019-01-08 MED ORDER — METHOCARBAMOL 500 MG PO TABS
500.0000 mg | ORAL_TABLET | Freq: Four times a day (QID) | ORAL | Status: DC | PRN
  Administered 2019-01-09 – 2019-01-10 (×3): 500 mg via ORAL
  Filled 2019-01-08 (×3): qty 1

## 2019-01-08 MED ORDER — ROCURONIUM BROMIDE 100 MG/10ML IV SOLN
INTRAVENOUS | Status: DC | PRN
  Administered 2019-01-08: 80 mg via INTRAVENOUS

## 2019-01-08 MED ORDER — ALBUMIN HUMAN 5 % IV SOLN
INTRAVENOUS | Status: DC | PRN
  Administered 2019-01-08: 18:00:00 via INTRAVENOUS

## 2019-01-08 MED ORDER — HEMOSTATIC AGENTS (NO CHARGE) OPTIME
TOPICAL | Status: DC | PRN
  Administered 2019-01-08: 1 via TOPICAL

## 2019-01-08 MED ORDER — LIDOCAINE-EPINEPHRINE 1 %-1:100000 IJ SOLN
INTRAMUSCULAR | Status: DC | PRN
  Administered 2019-01-08: 5 mL

## 2019-01-08 MED ORDER — MORPHINE SULFATE (PF) 2 MG/ML IV SOLN: 2.0000 mg | INTRAVENOUS | Status: DC | PRN

## 2019-01-08 MED ORDER — SODIUM CHLORIDE 0.9% FLUSH: 3.0000 mL | INTRAVENOUS | Status: DC | PRN

## 2019-01-08 MED ORDER — MENTHOL 3 MG MT LOZG: 1.0000 | LOZENGE | OROMUCOSAL | Status: DC | PRN

## 2019-01-08 MED ORDER — DEXAMETHASONE SODIUM PHOSPHATE 10 MG/ML IJ SOLN
INTRAMUSCULAR | Status: AC
  Filled 2019-01-08: qty 1

## 2019-01-08 MED ORDER — PHENYLEPHRINE 40 MCG/ML (10ML) SYRINGE FOR IV PUSH (FOR BLOOD PRESSURE SUPPORT)
PREFILLED_SYRINGE | INTRAVENOUS | Status: AC
  Filled 2019-01-08: qty 10

## 2019-01-08 MED ORDER — PROPOFOL 10 MG/ML IV BOLUS
INTRAVENOUS | Status: DC | PRN
  Administered 2019-01-08: 100 mg via INTRAVENOUS

## 2019-01-08 MED ORDER — METOPROLOL SUCCINATE ER 25 MG PO TB24
25.0000 mg | ORAL_TABLET | Freq: Every day | ORAL | Status: DC
  Administered 2019-01-09 – 2019-01-10 (×2): 25 mg via ORAL
  Filled 2019-01-08 (×2): qty 1

## 2019-01-08 MED ORDER — PROPOFOL 10 MG/ML IV BOLUS
INTRAVENOUS | Status: AC
  Filled 2019-01-08: qty 20

## 2019-01-08 MED ORDER — THROMBIN 5000 UNITS EX SOLR
OROMUCOSAL | Status: DC | PRN
  Administered 2019-01-08: 5 mL via TOPICAL

## 2019-01-08 MED ORDER — LIDOCAINE-EPINEPHRINE 1 %-1:100000 IJ SOLN
INTRAMUSCULAR | Status: AC
  Filled 2019-01-08: qty 1

## 2019-01-08 MED ORDER — ACETAMINOPHEN 325 MG PO TABS: 650.0000 mg | ORAL_TABLET | ORAL | Status: DC | PRN

## 2019-01-08 MED ORDER — EPHEDRINE 5 MG/ML INJ
INTRAVENOUS | Status: AC
  Filled 2019-01-08: qty 10

## 2019-01-08 MED ORDER — DEXCOM G6 RECEIVER DEVI: 1.0000 | Freq: Every day | Status: DC

## 2019-01-08 MED ORDER — INSULIN GLARGINE 100 UNIT/ML ~~LOC~~ SOLN
20.0000 [IU] | Freq: Every day | SUBCUTANEOUS | Status: DC
  Administered 2019-01-09 – 2019-01-10 (×2): 20 [IU] via SUBCUTANEOUS
  Filled 2019-01-08 (×2): qty 0.2

## 2019-01-08 MED ORDER — OXYCODONE-ACETAMINOPHEN 5-325 MG PO TABS
1.0000 | ORAL_TABLET | ORAL | Status: DC | PRN
  Administered 2019-01-08 – 2019-01-09 (×2): 2 via ORAL
  Administered 2019-01-09 – 2019-01-10 (×2): 1 via ORAL
  Filled 2019-01-08: qty 1
  Filled 2019-01-08 (×2): qty 2
  Filled 2019-01-08 (×2): qty 1

## 2019-01-08 MED ORDER — PANTOPRAZOLE SODIUM 40 MG PO TBEC
80.0000 mg | DELAYED_RELEASE_TABLET | Freq: Every day | ORAL | Status: DC
  Administered 2019-01-09 – 2019-01-10 (×2): 80 mg via ORAL
  Filled 2019-01-08 (×2): qty 2

## 2019-01-08 MED ORDER — SUGAMMADEX SODIUM 200 MG/2ML IV SOLN
INTRAVENOUS | Status: DC | PRN
  Administered 2019-01-08: 200 mg via INTRAVENOUS

## 2019-01-08 SURGICAL SUPPLY — 60 items
ADH SKN CLS APL DERMABOND .7 (GAUZE/BANDAGES/DRESSINGS) ×1
APL SRG 60D 8 XTD TIP BNDBL (TIP)
BAG DECANTER FOR FLEXI CONT (MISCELLANEOUS) ×2 IMPLANT
BASKET BONE COLLECTION (BASKET) ×2 IMPLANT
BLADE CLIPPER SURG (BLADE) IMPLANT
BUR MATCHSTICK NEURO 3.0 LAGG (BURR) ×2 IMPLANT
CANISTER SUCT 3000ML PPV (MISCELLANEOUS) ×2 IMPLANT
CONNECTOR RELINE-O 35-45 5.5 (Connector) ×1 IMPLANT
CONT SPEC 4OZ CLIKSEAL STRL BL (MISCELLANEOUS) ×2 IMPLANT
COVER BACK TABLE 60X90IN (DRAPES) ×2 IMPLANT
COVER WAND RF STERILE (DRAPES) ×1 IMPLANT
DECANTER SPIKE VIAL GLASS SM (MISCELLANEOUS) ×3 IMPLANT
DERMABOND ADVANCED (GAUZE/BANDAGES/DRESSINGS) ×1
DERMABOND ADVANCED .7 DNX12 (GAUZE/BANDAGES/DRESSINGS) ×1 IMPLANT
DEVICE DISSECT PLASMABLAD 3.0S (MISCELLANEOUS) ×1 IMPLANT
DRAPE C-ARM 42X72 X-RAY (DRAPES) ×3 IMPLANT
DRAPE HALF SHEET 40X57 (DRAPES) IMPLANT
DRAPE LAPAROTOMY 100X72X124 (DRAPES) ×2 IMPLANT
DURAPREP 26ML APPLICATOR (WOUND CARE) ×2 IMPLANT
DURASEAL APPLICATOR TIP (TIP) IMPLANT
DURASEAL SPINE SEALANT 3ML (MISCELLANEOUS) IMPLANT
ELECT REM PT RETURN 9FT ADLT (ELECTROSURGICAL) ×2
ELECTRODE REM PT RTRN 9FT ADLT (ELECTROSURGICAL) ×1 IMPLANT
GAUZE 4X4 16PLY RFD (DISPOSABLE) IMPLANT
GAUZE SPONGE 4X4 12PLY STRL (GAUZE/BANDAGES/DRESSINGS) ×2 IMPLANT
GLOVE BIOGEL PI IND STRL 8.5 (GLOVE) ×2 IMPLANT
GLOVE BIOGEL PI INDICATOR 8.5 (GLOVE) ×2
GLOVE ECLIPSE 8.5 STRL (GLOVE) ×4 IMPLANT
GOWN STRL REUS W/ TWL LRG LVL3 (GOWN DISPOSABLE) IMPLANT
GOWN STRL REUS W/ TWL XL LVL3 (GOWN DISPOSABLE) IMPLANT
GOWN STRL REUS W/TWL 2XL LVL3 (GOWN DISPOSABLE) ×4 IMPLANT
GOWN STRL REUS W/TWL LRG LVL3 (GOWN DISPOSABLE)
GOWN STRL REUS W/TWL XL LVL3 (GOWN DISPOSABLE)
HEMOSTAT POWDER KIT SURGIFOAM (HEMOSTASIS) ×1 IMPLANT
KIT BASIN OR (CUSTOM PROCEDURE TRAY) ×2 IMPLANT
KIT TURNOVER KIT B (KITS) ×2 IMPLANT
MARKER SKIN DUAL TIP RULER LAB (MISCELLANEOUS) ×1 IMPLANT
MILL MEDIUM DISP (BLADE) ×2 IMPLANT
NEEDLE HYPO 22GX1.5 SAFETY (NEEDLE) ×2 IMPLANT
NS IRRIG 1000ML POUR BTL (IV SOLUTION) ×2 IMPLANT
PACK LAMINECTOMY NEURO (CUSTOM PROCEDURE TRAY) ×2 IMPLANT
PAD ARMBOARD 7.5X6 YLW CONV (MISCELLANEOUS) ×6 IMPLANT
PATTIES SURGICAL .5 X1 (DISPOSABLE) ×2 IMPLANT
PLASMABLADE 3.0S (MISCELLANEOUS) ×2
ROD RELINE-O LORD 5.5X40 (Rod) ×2 IMPLANT
SCREW LOCK RELINE 5.5 TULIP (Screw) ×4 IMPLANT
SCREW RELINE-O POLY 5.5X40 (Screw) ×4 IMPLANT
SEALANT ADHERUS EXTEND TIP (MISCELLANEOUS) ×1 IMPLANT
SPONGE LAP 4X18 RFD (DISPOSABLE) IMPLANT
SPONGE SURGIFOAM ABS GEL 100 (HEMOSTASIS) ×2 IMPLANT
SUT PROLENE 6 0 BV (SUTURE) IMPLANT
SUT VIC AB 1 CT1 18XBRD ANBCTR (SUTURE) ×1 IMPLANT
SUT VIC AB 1 CT1 8-18 (SUTURE) ×2
SUT VIC AB 2-0 CP2 18 (SUTURE) ×2 IMPLANT
SUT VIC AB 3-0 SH 8-18 (SUTURE) ×2 IMPLANT
SYR 3ML LL SCALE MARK (SYRINGE) ×7 IMPLANT
TOWEL GREEN STERILE (TOWEL DISPOSABLE) ×2 IMPLANT
TOWEL GREEN STERILE FF (TOWEL DISPOSABLE) ×2 IMPLANT
TRAY FOLEY MTR SLVR 14FR STAT (SET/KITS/TRAYS/PACK) ×1 IMPLANT
WATER STERILE IRR 1000ML POUR (IV SOLUTION) ×2 IMPLANT

## 2019-01-08 NOTE — H&P (Signed)
[name] is an 78 y.o. female.   Chief Complaint: Back pain and leg weakness HPI: [name] is a 78 year old individuals had significant back pain and leg weakness.  Helped over several months.  But back pain is become excruciating and MRI of the lumbar spine was performed.  This demonstrates presence of an extruded fragment of disc at T10-T11 with significant spondylitic change and cord compression.  She presented to the office last week complaining of severe back pain and marked leg weakness such that she had difficulty standing and walking even brief distances.  [name] has had considerable difficulties with ankylosis in the past and had a cervical spinal fracture that resulted in near quadriplegia.  She recovered from this over the last number of however this back pain is now new and appears to be limiting her capacity to ambulate at all.  Been advised regarding the need for surgical decompression on urgent basis.  Past Medical History:  Diagnosis Date  . Anemia    during 1st pregnancy  . Anxiety   . Arthritis   . Cancer ([place])    Basal - face  . Complication of anesthesia   . Depression   . Diarrhea, functional   . Dysrhythmia    Paroysmal Atrial Tachycardia  . Essential hypertension   . Fibromyalgia   . GERD (gastroesophageal reflux disease)   . H/O acute pancreatitis   . Headache   . History of hiatal hernia   . Hyperlipemia   . Neuropathy   . Osteoporosis   . Panic attacks   . Paralysis ([place]) 01/06/2019   hands  . PAT (paroxysmal atrial tachycardia) ([place])   . Pneumonia   . PONV (postoperative nausea and vomiting)   . PSVT (paroxysmal supraventricular tachycardia) ([place])   . Reflux   . Restless leg syndrome   . Spinal cord compression ([place])   . Type 2 diabetes mellitus ([facility])    Type II  . Vertigo    not current    Past Surgical History:  Procedure Laterality Date  . ABDOMINAL HYSTERECTOMY    . ANKLE SURGERY Left    x 2  . ANTERIOR CERVICAL  DECOMP/DISCECTOMY FUSION N/A 11/12/2017   Procedure: Cervical seven to Thoracic one  Anterior cervical decompression/discectomy/fusion with removal of old plate;  Surgeon: [name], MD;  Location: [facility];  Service: Neurosurgery;  Laterality: N/A;  . BACK SURGERY     T12-L1 fusion, Anterior Cervical fusion  . CARDIAC CATHETERIZATION N/A 02/16/2015   Procedure: Left Heart Cath and Coronary Angiography;  Surgeon: Sherren Mocha, MD;  Location: [facility];  Service: Cardiovascular;  Laterality: N/A;  . CHOLECYSTECTOMY    . COLONOSCOPY    . ESOPHAGOGASTRODUODENOSCOPY  12/21/2010   Procedure: ESOPHAGOGASTRODUODENOSCOPY (EGD);  Surgeon: Landry Dyke, MD;  Location: [facility];  Service: Endoscopy;  Laterality: N/A;  . EXCISION MORTON'S NEUROMA Right   . EYE SURGERY Bilateral    Cataract  . FRACTURE SURGERY     ankle x 2   . KNEE SURGERY Right    x 3- arthroscopy  . NECK SURGERY    . POSTERIOR CERVICAL FUSION/FORAMINOTOMY N/A 01/22/2018   Procedure: Cervical six-seven Posterior Decompression with Posterior Fixation Cervical Five to Thoracic Two;  Surgeon: [name], MD;  Location: [facility];  Service: Neurosurgery;  Laterality: N/A;  Cervical 6-7 Posteror decompression with posterior fixation Cervical 5 to Thoracic 2  . ROTATOR CUFF REPAIR Right   . TONSILLECTOMY    . TOTAL KNEE  ARTHROPLASTY Left 04/10/2017   Procedure: LEFT TOTAL KNEE ARTHROPLASTY;  Surgeon: Paralee Cancel, MD;  Location: [facility];  Service: Orthopedics;  Laterality: Left;  70 mins  . UPPER GASTROINTESTINAL ENDOSCOPY    . WRIST SURGERY Left    fracture    Family History  Problem Relation Age of Onset  . Heart failure Mother   . Hyperlipidemia Mother   . Lung cancer Father        Lymphoma  . Hyperlipidemia Maternal Grandmother   . Peripheral vascular disease Maternal Grandmother    Social History:  reports that she has quit smoking. She has never used smokeless tobacco. She reports that she does not drink  alcohol or use drugs.  Allergies:  Allergies  Allergen Reactions  . Amoxil [Amoxicillin] Swelling and Other (See Comments)    Lips Has patient had a PCN reaction causing immediate rash, facial/tongue/throat swelling, SOB or lightheadedness with hypotension:YES Has patient had a PCN reaction causing severe rash involving mucus membranes or skin necrosis:No Has patient had a PCN reaction that required hospitalization:No Has patient had a PCN reaction occurring within the last 10 years:Yes. If all of the above answers are "NO", then may proceed with Cephalosporin use.   Marland Kitchen Lisinopril Itching and Swelling    SWELLING REACTION UNSPECIFIED   . Chocolate Hives  . Demerol Nausea And Vomiting  . Other Other (See Comments)    Raw food or nuts - GI pain    Medications Prior to Admission  Medication Sig Dispense Refill  . aspirin EC 81 MG tablet Take 81 mg by mouth at bedtime.     . Calcium Carb-Cholecalciferol (CALCIUM 600+D3) 600-200 MG-UNIT TABS Take 1 tablet by mouth at bedtime.     . Cranberry 450 MG CAPS Take 900 mg by mouth 2 (two) times daily.     . digoxin (LANOXIN) 0.125 MG tablet Take 1 tablet (0.125 mg total) by mouth daily. Patient needs appointment for further refills 90 tablet 3  . docusate sodium (COLACE) 100 MG capsule Take 100 mg by mouth daily.    . DULoxetine (CYMBALTA) 30 MG capsule Take 1 capsule (30 mg total) by mouth daily. (Patient taking differently: Take 30 mg by mouth at bedtime. ) 90 capsule 2  . ezetimibe (ZETIA) 10 MG tablet TAKE 1 TABLET BY MOUTH EVERY DAY (Patient taking differently: Take 10 mg by mouth daily. ) 90 tablet 1  . insulin aspart (NOVOLOG FLEXPEN) 100 UNIT/ML FlexPen Inject 10 Units into the skin daily with breakfast AND 14 Units daily with lunch AND 14 Units daily with supper. (Patient taking differently: Inject 8 Units into the skin daily with breakfast, 10 Units daily with lunch and 10 Units daily with supper.) 15 mL 11  . insulin glargine (LANTUS)  100 UNIT/ML injection Inject 20 Units into the skin daily.     . meloxicam (MOBIC) 15 MG tablet Take 15 mg by mouth daily.     . metoprolol succinate (TOPROL-XL) 25 MG 24 hr tablet Take 1 tablet (25 mg total) by mouth daily. Patient needs to schedule an appointment for an additional refill 90 tablet 3  . Multiple Vitamins-Minerals (PRESERVISION AREDS 2 PO) Take 1 tablet by mouth 2 (two) times daily.     Marland Kitchen omeprazole (PRILOSEC) 20 MG capsule Take 1 capsule (20 mg total) by mouth daily. 90 capsule 2  . oxyCODONE-acetaminophen (PERCOCET/ROXICET) 5-325 MG tablet Take 1 tablet by mouth every 6 (six) hours as needed for severe pain.     Marland Kitchen  ramipril (ALTACE) 10 MG capsule Take 1 capsule (10 mg total) by mouth every evening. 90 capsule 2  . rOPINIRole (REQUIP) 1 MG tablet TAKE ONE TABLET BY MOUTH AT BEDTIME. (Patient taking differently: Take 1 mg by mouth at bedtime. ) 90 tablet 1  . tamsulosin (FLOMAX) 0.4 MG CAPS capsule TAKE 1 CAPSULE BY MOUTH EVERY DAY (Patient taking differently: Take 0.4 mg by mouth daily. ) 90 capsule 0  . Turmeric 500 MG TABS Take 500 mg by mouth 2 (two) times daily.     . verapamil (VERELAN PM) 240 MG 24 hr capsule Take 1 capsule (240 mg total) by mouth daily. 90 capsule 3  . VITAMIN D PO Take 10,000 Units by mouth daily.     . BD PEN NEEDLE MICRO U/F 32G X 6 MM MISC 4 TIMES A DAY NOVOLOG(3) AND LANTUS9(1) 100 each 6  . Continuous Blood Gluc Receiver (DEXCOM G6 RECEIVER) DEVI 1 Device by Does not apply route daily. 1 Device 0  . Continuous Blood Gluc Sensor (DEXCOM G6 SENSOR) MISC 1 kit by Does not apply route as directed. 3 each 3  . Continuous Blood Gluc Transmit (DEXCOM G6 TRANSMITTER) MISC 1 kit by Does not apply route as directed. 1 each 3  . glucose blood test strip Use as instructed to test blood sugars 3 times daily DX.E11.42 100 each 12  . nystatin (MYCOSTATIN/NYSTOP) powder Apply topically 3 (three) times daily as needed. (Patient not taking: Reported on 01/03/2019) 15 g 6   . ondansetron (ZOFRAN) 4 MG tablet Take 1 tablet (4 mg total) by mouth every 8 (eight) hours as needed for nausea or vomiting. (Patient not taking: Reported on 01/03/2019) 20 tablet 0  . RELION INSULIN SYRINGE 1ML/31G 31G X 5/16" 1 ML MISC Use as directed 100 each 1  . verapamil (CALAN) 120 MG tablet Take 120 mg by mouth daily as needed (afib).       Results for orders placed or performed during the hospital encounter of 01/08/19 (from the past 48 hour(s))  Glucose, capillary     Status: Abnormal   Collection Time: 01/08/19 12:11 PM  Result Value Ref Range   Glucose-Capillary 146 (H) 70 - 99 mg/dL  Basic metabolic panel     Status: Abnormal   Collection Time: 01/08/19 12:19 PM  Result Value Ref Range   Sodium 135 135 - 145 mmol/L   Potassium 4.4 3.5 - 5.1 mmol/L   Chloride 103 98 - 111 mmol/L   CO2 23 22 - 32 mmol/L   Glucose, Bld 151 (H) 70 - 99 mg/dL   BUN 18 8 - 23 mg/dL   Creatinine, Ser 0.89 0.44 - 1.00 mg/dL   Calcium 9.4 8.9 - 10.3 mg/dL   GFR calc non Af Amer >60 >60 mL/min   GFR calc Af Amer >60 >60 mL/min   Anion gap 9 5 - 15    Comment: Performed at [facility], [place] [address] [place], [place] [address]  Glucose, capillary     Status: Abnormal   Collection Time: 01/08/19  1:46 PM  Result Value Ref Range   Glucose-Capillary 153 (H) 70 - 99 mg/dL  Glucose, capillary     Status: Abnormal   Collection Time: 01/08/19  3:18 PM  Result Value Ref Range   Glucose-Capillary 145 (H) 70 - 99 mg/dL   No results found.  Review of Systems  Constitutional: Negative.   HENT: Negative.   Eyes: Negative.   Respiratory: Negative.  Cardiovascular: Negative.   Gastrointestinal: Negative.   Genitourinary: Negative.   Musculoskeletal: Positive for back pain.  Skin: Negative.   Neurological: Positive for tingling, sensory change and weakness.  Endo/Heme/Allergies: Negative.   Psychiatric/Behavioral: Negative.     Blood pressure (!) 141/87, pulse 62, temperature 98.2 F  (36.8 C), temperature source Oral, resp. rate 17, height 5' 4"  (1.626 m), weight 90.7 kg, SpO2 99 %. Physical Exam  Constitutional: She is oriented to person, place, and time. She appears well-developed and well-nourished.  HENT:  Head: Normocephalic and atraumatic.  Eyes: Pupils are equal, round, and reactive to light. Conjunctivae and EOM are normal.  Neck: Normal range of motion. Neck supple.  Cardiovascular: Normal rate and regular rhythm.  Respiratory: Effort normal and breath sounds normal.  GI: Soft.  Neurological: She is alert and oriented to person, place, and time.  Weakness at iliopsoas quadriceps bilaterally tibialis anterior and gastrocs are weak 4-5.  Absent deep tendon reflexes in the patellae and Achilles.  Upper extremities reveal 4 out of 5 strength in the deltoids biceps triceps grips and intrinsics.  There is hyperreflexia in the tricep cranial nerve examination is within limits of normal.  Skin: Skin is warm and dry.  Psychiatric: She has a normal mood and affect. Her behavior is normal. Judgment and thought content normal.     Assessment/Plan Herniated nucleus pulposus T10-T11 with thoracic myelopathy.  Plan: Decompression fusion T10-T11.  History of fusion at T12-L1 from previous similar event.  Earleen Newport, MD 01/08/2019, 4:18 PM

## 2019-01-08 NOTE — Anesthesia Preprocedure Evaluation (Addendum)
Anesthesia Evaluation  Patient identified by MRN, date of birth, ID band Patient awake    Reviewed: Allergy & Precautions, NPO status , Patient's Chart, lab work & pertinent test results  History of Anesthesia Complications (+) PONV  Airway Mallampati: II  TM Distance: >3 FB     Dental   Pulmonary pneumonia, former smoker,    breath sounds clear to auscultation       Cardiovascular hypertension, + dysrhythmias  Rhythm:Regular Rate:Normal     Neuro/Psych  Headaches, PSYCHIATRIC DISORDERS Anxiety Depression  Neuromuscular disease    GI/Hepatic Neg liver ROS, hiatal hernia, GERD  ,  Endo/Other  diabetes  Renal/GU negative Renal ROS     Musculoskeletal  (+) Arthritis , Fibromyalgia -  Abdominal   Peds  Hematology  (+) anemia ,   Anesthesia Other Findings   Reproductive/Obstetrics                             Anesthesia Physical Anesthesia Plan  ASA: III  Anesthesia Plan: General   Post-op Pain Management:    Induction:   PONV Risk Score and Plan: 4 or greater and Ondansetron, Dexamethasone and Midazolam  Airway Management Planned: Oral ETT  Additional Equipment:   Intra-op Plan:   Post-operative Plan: Possible Post-op intubation/ventilation  Informed Consent: I have reviewed the patients History and Physical, chart, labs and discussed the procedure including the risks, benefits and alternatives for the proposed anesthesia with the patient or authorized representative who has indicated his/her understanding and acceptance.     Dental advisory given  Plan Discussed with: CRNA and Anesthesiologist  Anesthesia Plan Comments:         Anesthesia Quick Evaluation

## 2019-01-08 NOTE — Transfer of Care (Signed)
Immediate Anesthesia Transfer of Care Note  Patient: TALESE DEJULIO  Procedure(s) Performed: Thoracic Ten-Thoracic Eleven Decompression/Fusion/Posterior lateral arthrodesis with pedicle screws (N/A Back)  Patient Location: PACU  Anesthesia Type:General  Level of Consciousness: oriented, sedated, drowsy, patient cooperative and responds to stimulation  Airway & Oxygen Therapy: Patient Spontanous Breathing and Patient connected to nasal cannula oxygen  Post-op Assessment: Report given to RN, Post -op Vital signs reviewed and stable and Patient moving all extremities X 4  Post vital signs: Reviewed and stable  Last Vitals:  Vitals Value Taken Time  BP 153/60 01/08/19 1901  Temp    Pulse 78 01/08/19 1905  Resp 18 01/08/19 1905  SpO2 100 % 01/08/19 1905  Vitals shown include unvalidated device data.  Last Pain:  Vitals:   01/08/19 1249  TempSrc:   PainSc: 0-No pain         Complications: No apparent anesthesia complications

## 2019-01-08 NOTE — Progress Notes (Signed)
Orthopedic Tech Progress Note Patient Details:  [name] 19-Mar-1940 [id number] Patient has TLSO brace in her room. The nurse said she does not need the LSO. And will not need ORTHO to put the TLSO ON until the morning when she gets out of bed.  Patient ID: [name], female   DOB: Dec 17, 1940, 78 y.o.   MRN: [id number]   Staci Righter 01/08/2019, 8:53 PM

## 2019-01-08 NOTE — Anesthesia Procedure Notes (Signed)
Procedure Name: Intubation Date/Time: 01/08/2019 4:42 PM Performed by: Nikol Lemar T, CRNA Pre-anesthesia Checklist: Patient identified, Emergency Drugs available, Suction available and Patient being monitored Patient Re-evaluated:Patient Re-evaluated prior to induction Oxygen Delivery Method: Circle system utilized Preoxygenation: Pre-oxygenation with 100% oxygen Induction Type: IV induction Ventilation: Mask ventilation without difficulty and Oral airway inserted - appropriate to patient size Laryngoscope Size: Miller and 3 Grade View: Grade II Tube type: Oral Tube size: 7.5 mm Number of attempts: 1 Airway Equipment and Method: Patient positioned with wedge pillow and Stylet Placement Confirmation: ETT inserted through vocal cords under direct vision,  positive ETCO2 and breath sounds checked- equal and bilateral Secured at: 21 cm Tube secured with: Tape Dental Injury: Teeth and Oropharynx as per pre-operative assessment

## 2019-01-08 NOTE — Anesthesia Postprocedure Evaluation (Signed)
Anesthesia Post Note  Patient: [name]  Procedure(s) Performed: Thoracic Ten-Thoracic Eleven Decompression/Fusion/Posterior lateral arthrodesis with pedicle screws (N/A Back)     Patient location during evaluation: PACU Anesthesia Type: General Level of consciousness: awake Pain management: pain level controlled Vital Signs Assessment: post-procedure vital signs reviewed and stable Respiratory status: spontaneous breathing, nonlabored ventilation, respiratory function stable and patient connected to nasal cannula oxygen Cardiovascular status: blood pressure returned to baseline and stable Postop Assessment: no apparent nausea or vomiting Anesthetic complications: no    Last Vitals:  Vitals:   01/08/19 1945 01/08/19 2001  BP: 140/79 135/84  Pulse: 71 70  Resp: 10 11  Temp: (!) 36.3 C 36.6 C  SpO2: 97% 100%    Last Pain:  Vitals:   01/08/19 2127  TempSrc:   PainSc: 9                  Valon Glasscock P Janard Culp

## 2019-01-08 NOTE — Op Note (Signed)
Date of surgery: 01/08/2019 Preoperative diagnosis: Herniated nucleus pulposus T10-T11 with thoracic myelopathy.  Paraparesis Postoperative diagnosis: Herniated nucleus pulposus T10-T11 with thoracic myelopathy.  Paraparesis Procedure: Laminectomy T10-T11 with decompression of spinal cord discectomy T10-T11 posterior lateral arthrodesis with local autograft T10-T11 pedicle fixation T10-T11. Surgeon: [name] Anesthesia: General endotracheal Indications: [name] is a 78 year old individual has had previous spondylopathic misfortunes that have resulted in near quadriplegia this happened in her cervical spine a few years ago after an acutely ruptured disc.  She has had decompressions at the T12-L1 level in the lower lumbar spine and also in the cervical spine.  She has had increasing weakness in the lower extremities and a recent MRI demonstrates presence of a herniated nucleus pulposus that appears calcified at the T10-T11 level where there is severe stenosis causing spinal cord compression she has been advised regarding the need for surgery.  Procedure: Patient was brought to the OR supine on the stretcher.  After the smooth induction of general endotracheal anesthesia she was carefully turned prone.  The back of the thoracic spine and thoracolumbar junction was cleansed with alcohol and prepped with DuraPrep and draped in a sterile fashion.  Fluoroscopic guidance was used to localize the T10-T11 interspace in the level of the lines at the T10 and T11 pedicles.  Then a midline incision was created in this region and carried down to the thoracodorsal fascia fascia was opened on either side of midline to expose T10 and T11 out to the region of the pedicles.  The laminar arch was completely cleared and then a laminectomy was completed using a Leksell rongeur to remove the spinous process down to the lamina base high-speed drill was used to harvest bone from this region as the laminectomy was  completed.  Then the laminectomy was widened so as to be able to see around the edges of the lateral aspect of the dura on the left side where the disc was more prominent there was careful dissection which yielded a calcified disc fragment just below the level of the disc space by carefully dissecting laterally the calcified portion could be gradually mobilized with a small nerve hook and then carefully it was broken into small pieces using a 2 mm Kerrison punch and the pieces were gradually removed.  Ultimately was felt that the biggest portion of the calcified disc was removed right at the level of the disc space.  Once this was accomplished there was noted to be some CSF leakage from the ventral aspect of the dura this was carefully tamponaded and then a biologic adhesive was used to seal the leak.  With the decompression this being completed attention was turned to placement of pedicle screws in T10 and T11 which was done with fluoroscopic guidance 5.5 x 40 mm screws were placed in T10 5.5 x 40 mm screws were placed in T11 lateral gutters were decorticated and the bone graft which had been harvested from the thoracic laminectomy was packed into each lateral gutter 40 mm precontoured rods were then used to connect the screws together.  Because there was some looseness in the disc space a transverse connector measuring 35 mm in width was used to connect the 2 rods to prevent any shifting laterally of the construct.  With this hemostasis in the soft tissues was obtained no CSF leakage was identified and the thoracodorsal fascia was then closed with #1 Vicryl interrupted fashion 2-0 Vicryl was used in the subcutaneous tissues 20 cc of half percent Marcaine was  injected into the paraspinous fascia.  The skin was closed with 3-0 Vicryl in interrupted fashion and Dermabond was used on the skin blood loss for the procedure was estimated at 150 cc.

## 2019-01-09 LAB — GLUCOSE, CAPILLARY: Glucose-Capillary: 156 mg/dL — ABNORMAL HIGH (ref 70–99)

## 2019-01-09 NOTE — Evaluation (Signed)
Physical Therapy Evaluation Patient Details Name: [name] MRN: [id number] DOB: September 20, 1940 Today's Date: 01/09/2019   History of Present Illness  Pt is a 78 y/o female with herniated nucleus pulposus T10-11 with throacic myelopathy, s/p PLIF T10-11. PMH: depression, anxiety, HTN, fibromyalgia, pneumonia, spinal cord compression, DM2, ACDF, posterior cervical fusion.   Clinical Impression  Pt admitted with/for thoracic fusion surgery.  Pt needing min guard to light min for mobility.  Pt currently limited functionally due to the problems listed below.  (see problems list.)  Pt will benefit from PT to maximize function and safety to be able to get home safely with available assist .     Follow Up Recommendations Home health PT;Supervision/Assistance - 24 hour    Equipment Recommendations  None recommended by PT    Recommendations for Other Services       Precautions / Restrictions Precautions Precautions: Back;Fall Precaution Booklet Issued: No Precaution Comments: reviewed with patient  Required Braces or Orthoses: Spinal Brace Spinal Brace: Thoracolumbosacral orthotic;Applied in sitting position Restrictions Weight Bearing Restrictions: No      Mobility  Bed Mobility Overal bed mobility: Needs Assistance Bed Mobility: Rolling;Sidelying to Sit Rolling: Supervision Sidelying to sit: Min guard       General bed mobility comments: cueing for log roll technique, min guard to ascend trunk for safety; increased time and cueing   Transfers Overall transfer level: Needs assistance Equipment used: Rolling walker (2 wheeled) Transfers: Sit to/from Stand Sit to Stand: Min guard         General transfer comment: cued for better technique and got pt to min guard assist  Ambulation/Gait Ambulation/Gait assistance: Min guard Gait Distance (Feet): 140 Feet Assistive device: Rolling walker (2 wheeled) Gait Pattern/deviations: Step-through pattern   Gait velocity  interpretation: <1.8 ft/sec, indicate of risk for recurrent falls General Gait Details: generally steady with cues for posture and better RW placement  Stairs            Wheelchair Mobility    Modified Rankin (Stroke Patients Only)       Balance Overall balance assessment: Needs assistance Sitting-balance support: No upper extremity supported;Feet supported Sitting balance-[name]: Fair     Standing balance support: Bilateral upper extremity supported;During functional activity Standing balance-[name]: Poor Standing balance comment: relaint on BUE support                             Pertinent Vitals/Pain Pain Assessment: Faces Faces Pain Scale: Hurts a little bit Pain Location: back Pain Descriptors / Indicators: Discomfort;Operative site guarding Pain Intervention(s): Monitored during session;Limited activity within patient's tolerance    Home Living Family/patient expects to be discharged to:: Private residence Living Arrangements: Alone Available Help at Discharge: Friend(s)(8-3 caregivers ) Type of Home: House Home Access: Ramped entrance     Home Layout: One level Home Equipment: Shower seat;Grab bars - toilet;Grab bars - tub/shower;Walker - 2 wheels;Cane - single point;Bedside commode;Transport chair      Prior Function Level of Independence: Needs assistance   Gait / Transfers Assistance Needed: uses walker for mobility   ADL's / Homemaking Assistance Needed: modifies clothing and use a bidet for hygiene, requires assist for dressing and bathing from caregivers microwave meals prepared for evenings    Comments: caregivers 8-3 assist with ADLs, IADLs      Hand Dominance   Dominant Hand: Right    Extremity/Trunk Assessment        Lower Extremity  Assessment Lower Extremity Assessment: Generalized weakness;Overall [facility] for tasks assessed    Cervical / Trunk Assessment Cervical / Trunk Assessment: Other exceptions Cervical /  Trunk Exceptions: s/p back surgery  Communication   Communication: No difficulties  Cognition Arousal/Alertness: Awake/alert Behavior During Therapy: WFL for tasks assessed/performed Overall Cognitive Status: Within Functional Limits for tasks assessed                                        General Comments General comments (skin integrity, edema, etc.): vss, pt/dtr reinforced in back care/prec, log roll, transitions to sit, bracing issues, progression of activity    Exercises     Assessment/Plan    PT Assessment Patient needs continued PT services  PT Problem List Decreased strength;Decreased activity tolerance;Decreased mobility;Decreased knowledge of use of DME;Decreased knowledge of precautions;Pain       PT Treatment Interventions Gait training;Stair training;DME instruction;Functional mobility training;Therapeutic activities;Patient/family education    PT Goals (Current goals can be found in the Care Plan section)  Acute Rehab PT Goals Patient Stated Goal: to get home soon PT Goal Formulation: With patient Time For Goal Achievement: 01/09/19    Frequency Min 5X/week   Barriers to discharge        Co-evaluation               AM-PAC PT "6 Clicks" Mobility  Outcome Measure Help needed turning from your back to your side while in a flat bed without using bedrails?: A Little Help needed moving from lying on your back to sitting on the side of a flat bed without using bedrails?: A Little Help needed moving to and from a bed to a chair (including a wheelchair)?: A Little Help needed standing up from a chair using your arms (e.g., wheelchair or bedside chair)?: A Little Help needed to walk in hospital room?: A Little Help needed climbing 3-5 steps with a railing? : A Lot 6 Click Score: 17    End of Session   Activity Tolerance: Patient tolerated treatment well;Patient limited by pain Patient left: in chair;with call bell/phone within reach;with  family/visitor present Nurse Communication: Mobility status PT Visit Diagnosis: Other abnormalities of gait and mobility (R26.89);Pain Pain - part of body: (back)    Time: [id number] PT Time Calculation (min) (ACUTE ONLY): 34 min   Charges:   PT Evaluation $PT Eval Moderate Complexity: 1 Mod PT Treatments $Gait Training: 8-22 mins        01/09/2019  Donnella Sham, PT Acute Rehabilitation Services [phone]  (pager) [phone]  (office)  Tessie Fass Chelcey Caputo 01/09/2019, 1:51 PM

## 2019-01-09 NOTE — Evaluation (Signed)
Occupational Therapy Evaluation Patient Details Name: [name] MRN: [id number] DOB: 07-19-1940 Today's Date: 01/09/2019    History of Present Illness Pt is a 78 y/o female with herniated nucleus pulposus T10-11 with throacic myelopathy, s/p PLIF T10-11. PMH: depression, anxiety, HTN, fibromyalgia, pneumonia, spinal cord compression, DM2, ACDF, posterior cervical fusion.    Clinical Impression   PTA patient reports using RW for mobility without assist, requires assist for ADLs/IADLs but has hired caregivers from 8-3 (plans to get 24/7 support at discharge).  Patient currently admitted for above and limited by problem list below, including impaired balance, pain, generalized weakness, decreased activity tolerance, and back precautions.  Patient educated on back precautions, ADL compensatory techniques, safety/recommendations, and mobility.  Patient requires min guard for bed mobility, min assist for transfers, mod assist for UB ADLs (total assist for brace mgmt), and max assist for LB ADLs.  Patient will benefit from continued OT services while admitted and after dc at [facility] level in order to optimize return to PLOF with ADLs,mobility. Recommend 24/7 support at dc.     Follow Up Recommendations  Home health OT;Supervision/Assistance - 24 hour    Equipment Recommendations  None recommended by OT    Recommendations for Other Services PT consult     Precautions / Restrictions Precautions Precautions: Back;Fall Precaution Booklet Issued: No Precaution Comments: reviewed with patient  Required Braces or Orthoses: Spinal Brace Spinal Brace: Thoracolumbosacral orthotic;Applied in sitting position Restrictions Weight Bearing Restrictions: No      Mobility Bed Mobility Overal bed mobility: Needs Assistance Bed Mobility: Rolling;Sidelying to Sit Rolling: Supervision Sidelying to sit: Min guard       General bed mobility comments: cueing for log roll technique, min guard to ascend  trunk for safety; increased time and cueing   Transfers Overall transfer level: Needs assistance Equipment used: Rolling walker (2 wheeled) Transfers: Sit to/from Stand Sit to Stand: Min assist         General transfer comment: min assist to power up and steady with cueing for hand placement and posture    Balance Overall balance assessment: Needs assistance Sitting-balance support: No upper extremity supported;Feet supported Sitting balance-[name]: Fair     Standing balance support: Bilateral upper extremity supported;During functional activity Standing balance-[name]: Poor Standing balance comment: relaint on BUE support                           ADL either performed or assessed with clinical judgement   ADL Overall ADL's : Needs assistance/impaired Eating/Feeding: Set up;Sitting   Grooming: Sitting;Minimal assistance   Upper Body Bathing: Sitting;Moderate assistance   Lower Body Bathing: Maximal assistance;Sit to/from stand   Upper Body Dressing : Moderate assistance;Sitting   Lower Body Dressing: Maximal assistance;Sit to/from stand Lower Body Dressing Details (indicate cue type and reason): requires assist for socks, min assist sit to stand  Toilet Transfer: Minimal assistance;Ambulation;RW Toilet Transfer Details (indicate cue type and reason): several steps to recliner, simulated using RW          Functional mobility during ADLs: Minimal assistance;Rolling walker General ADL Comments: pt limited by decreased functional use of B hands, impaired balance, weakness, back precautions--reviewed ADL compensatory techniques, precautions and brace mgmt       Vision   Vision Assessment?: No apparent visual deficits     Perception     Praxis      Pertinent Vitals/Pain Pain Assessment: Faces Faces Pain Scale: Hurts a little bit Pain  Location: back Pain Descriptors / Indicators: Discomfort;Operative site guarding Pain Intervention(s):  Limited activity within patient's tolerance;Repositioned;Monitored during session     Hand Dominance Right   Extremity/Trunk Assessment Upper Extremity Assessment Upper Extremity Assessment: LUE deficits/detail;RUE deficits/detail RUE Deficits / Details: grossly 3+/5, limited functional hand use with hand flexion contractures with minimal extension--able to hold walker but unable to use functionally  LUE Deficits / Details: grossly 3+/5, L hand index finger contracture at PIP and DIP- able to functionally grasp using UE    Lower Extremity Assessment Lower Extremity Assessment: Defer to PT evaluation   Cervical / Trunk Assessment Cervical / Trunk Assessment: Other exceptions Cervical / Trunk Exceptions: s/p back surgery   Communication Communication Communication: No difficulties   Cognition Arousal/Alertness: Awake/alert Behavior During Therapy: WFL for tasks assessed/performed Overall Cognitive Status: Within Functional Limits for tasks assessed                                     General Comments  pt reports dizziness upon sitting in recliner; BP supine 132/69 and seated 118/79 ; dizziness faded after several minutes; otherwise VSS    Exercises     Shoulder Instructions      Home Living Family/patient expects to be discharged to:: Private residence Living Arrangements: Alone Available Help at Discharge: Friend(s)(8-3 caregivers ) Type of Home: House Home Access: Ramped entrance     Home Layout: One level     Bathroom Shower/Tub: Walk-in Psychologist, prison and probation services: Handicapped height     Home Equipment: Shower seat;Grab bars - toilet;Grab bars - tub/shower;Walker - 2 wheels;Cane - single point;Bedside commode;Transport chair          Prior Functioning/Environment Level of Independence: Needs assistance  Gait / Transfers Assistance Needed: uses walker for mobility  ADL's / Homemaking Assistance Needed: modifies clothing and use a bidet for  hygiene, requires assist for dressing and bathing from caregivers microwave meals prepared for evenings     Comments: caregivers 8-3 assist with ADLs, IADLs         OT Problem List: Decreased strength;Decreased activity tolerance;Impaired balance (sitting and/or standing);Decreased knowledge of use of DME or AE;Decreased knowledge of precautions;Pain;Impaired UE functional use;Decreased range of motion      OT Treatment/Interventions: Self-care/ADL training;DME and/or AE instruction;Patient/family education;Balance training;Therapeutic activities    OT Goals(Current goals can be found in the care plan section) Acute Rehab OT Goals Patient Stated Goal: to get home soon OT Goal Formulation: With patient Time For Goal Achievement: 01/23/19 Potential to Achieve Goals: Good  OT Frequency: Min 2X/week   Barriers to D/C: Decreased caregiver support(but plans to get 24/7 support)          Co-evaluation              AM-PAC OT "6 Clicks" Daily Activity     Outcome Measure Help from another person eating meals?: A Little Help from another person taking care of personal grooming?: A Little Help from another person toileting, which includes using toliet, bedpan, or urinal?: A Lot Help from another person bathing (including washing, rinsing, drying)?: A Lot Help from another person to put on and taking off regular upper body clothing?: A Lot Help from another person to put on and taking off regular lower body clothing?: Total 6 Click Score: 13   End of Session Equipment Utilized During Treatment: Rolling walker;Back brace Nurse Communication: Mobility status;Precautions  Activity Tolerance: Patient  tolerated treatment well Patient left: in chair;with call bell/phone within reach;with nursing/sitter in room  OT Visit Diagnosis: Other abnormalities of gait and mobility (R26.89);Muscle weakness (generalized) (M62.81);Pain Pain - part of body: (back- incsional)                Time:  [id number] OT Time Calculation (min): 36 min Charges:  OT General Charges $OT Visit: 1 Visit OT Evaluation $OT Eval Moderate Complexity: 1 Mod OT Treatments $Self Care/Home Management : 8-22 mins  Delight Stare, OT Acute Rehabilitation Services Pager [phone] Office [phone]   Delight Stare 01/09/2019, 9:22 AM

## 2019-01-09 NOTE — Progress Notes (Signed)
Patient ID: [name], female   DOB: 07-14-1940, 78 y.o.   MRN: [id number] Vital signs are stable postop day 1. Patient notes that she is feeling much better with much decrease pain in her abdomen chest wall and lower extremities.  Motor function appears stable.  She is able to ambulate and get out of bed more easily.  Overall she is significantly improved.  Her incision is clean and dry.  We will mobilize her with the use of PT today and hopeful for discharge home tomorrow.

## 2019-01-10 MED ORDER — OXYCODONE-ACETAMINOPHEN 5-325 MG PO TABS: 1.0000 | ORAL_TABLET | ORAL | 0 refills | Status: DC | PRN

## 2019-01-10 NOTE — Progress Notes (Signed)
Physical Therapy Treatment Patient Details Name: [name] MRN: [id number] DOB: 08-10-1940 Today's Date: 01/10/2019    History of Present Illness Pt is a 78 y/o female with herniated nucleus pulposus T10-11 with throacic myelopathy, s/p PLIF T10-11. PMH: depression, anxiety, HTN, fibromyalgia, pneumonia, spinal cord compression, DM2, ACDF, posterior cervical fusion.     PT Comments    Pt more sore as expected and moving slower.  She shows that she should be able to manage well at home with available help.  Emphasis on bed mobility, transfers, gait progression and education.    Follow Up Recommendations  Home health PT;Supervision/Assistance - 24 hour     Equipment Recommendations  None recommended by PT    Recommendations for Other Services       Precautions / Restrictions Precautions Precautions: Back;Fall Precaution Booklet Issued: Yes (comment) Precaution Comments: reviewed with patient  Required Braces or Orthoses: Spinal Brace Spinal Brace: Thoracolumbosacral orthotic;Applied in sitting position Restrictions Weight Bearing Restrictions: No    Mobility  Bed Mobility Overal bed mobility: Needs Assistance Bed Mobility: Rolling;Sidelying to Sit Rolling: Supervision Sidelying to sit: Min assist       General bed mobility comments: min assist up from right side due to R UE is mildly weaker than right.  Transfers Overall transfer level: Needs assistance Equipment used: Rolling walker (2 wheeled) Transfers: Sit to/from Stand Sit to Stand: Min guard         General transfer comment: pt remembered and used safe transfer technique without assist  Ambulation/Gait Ambulation/Gait assistance: Min guard Gait Distance (Feet): 200 Feet Assistive device: Rolling walker (2 wheeled) Gait Pattern/deviations: Step-through pattern Gait velocity: slow   General Gait Details: generally steady, but guarded and slow.   Stairs Stairs: (pt deferred stairs due to has  ramps at home.)           Wheelchair Mobility    Modified Rankin (Stroke Patients Only)       Balance Overall balance assessment: Needs assistance Sitting-balance support: No upper extremity supported;Feet supported Sitting balance-[name]: Fair Sitting balance - Comments: held brace in place while it was donned.   Standing balance support: Bilateral upper extremity supported;During functional activity Standing balance-[name]: Poor Standing balance comment: relaint on BUE support                            Cognition Arousal/Alertness: Awake/alert Behavior During Therapy: WFL for tasks assessed/performed Overall Cognitive Status: Within Functional Limits for tasks assessed                                        Exercises      General Comments General comments (skin integrity, edema, etc.): vss, pt/dtr reinforced in back care/prec, log roll, transitions to sit, bracing issues, progression of activity      Pertinent Vitals/Pain Pain Assessment: Faces Pain Score: 4  Faces Pain Scale: Hurts a little bit Pain Location: back Pain Descriptors / Indicators: Discomfort;Operative site guarding Pain Intervention(s): Monitored during session    Home Living Family/patient expects to be discharged to:: Private residence                    Prior Function            PT Goals (current goals can now be found in the care plan section) Acute Rehab PT Goals Patient  Stated Goal: to get home soon PT Goal Formulation: With patient Time For Goal Achievement: 01/11/19 Progress towards PT goals: Progressing toward goals    Frequency    Min 5X/week      PT Plan Current plan remains appropriate    Co-evaluation              AM-PAC PT "6 Clicks" Mobility   Outcome Measure  Help needed turning from your back to your side while in a flat bed without using bedrails?: A Little Help needed moving from lying on your back to  sitting on the side of a flat bed without using bedrails?: A Little Help needed moving to and from a bed to a chair (including a wheelchair)?: A Little Help needed standing up from a chair using your arms (e.g., wheelchair or bedside chair)?: A Little Help needed to walk in hospital room?: A Little Help needed climbing 3-5 steps with a railing? : A Lot 6 Click Score: 17    End of Session   Activity Tolerance: Patient tolerated treatment well Patient left: in chair;with call bell/phone within reach;with family/visitor present Nurse Communication: Mobility status PT Visit Diagnosis: Other abnormalities of gait and mobility (R26.89);Pain Pain - part of body: (back)     Time: [id number] PT Time Calculation (min) (ACUTE ONLY): 26 min  Charges:  $Gait Training: 8-22 mins $Therapeutic Activity: 8-22 mins                     01/10/2019  [name], PT [place] [phone]  (pager) [phone]  (office)   Tessie Fass Paola Aleshire 01/10/2019, 11:34 AM

## 2019-01-10 NOTE — Discharge Summary (Signed)
Physician Discharge Summary  Patient ID: [name] MRN: [id number] DOB/AGE: 05/21/1940 78 y.o.  Admit date: 01/08/2019 Discharge date: 01/10/2019  Admission Diagnoses: Thoracic myelopathy secondary to herniated nucleus pulposus T10-T11.  History of cervical spine injury with myelopathy.  Diabetes mellitus.  Quadriparesis.  Discharge Diagnoses: Thoracic myelopathy secondary to herniated nucleus pulposus T10-T11.  History of cervical spine injury with myelopathy.  Diabetes mellitus.  Quadriparesis. Active Problems:   Thoracic myelopathy   Discharged Condition: good  Hospital Course: Patient was admitted to undergo surgical decompression at T10-T11 which she tolerated well.  Fixation was also completed at that time.  Her incision is clean and dry.  Consults: None  Significant Diagnostic Studies: None  Treatments: surgery: Decompression T10-T11 with discectomy posterior lateral fusion with pedicle screw fixation T10-T11.  Discharge Exam: Blood pressure 125/66, pulse 75, temperature 97.9 F (36.6 C), temperature source Oral, resp. rate 18, height 5' 4" (1.626 m), weight 90.7 kg, SpO2 99 %. Incision is clean and dry.  Patient's station and gait are capable with the use of a walker.  She is markedly myelopathic in the lower extremities and in the upper extremities.  Disposition: Discharge disposition: 01-Home or Self Care       Discharge Instructions    Call MD for:  redness, tenderness, or signs of infection (pain, swelling, redness, odor or green/yellow discharge around incision site)   Complete by: As directed    Call MD for:  severe uncontrolled pain   Complete by: As directed    Call MD for:  temperature >100.4   Complete by: As directed    Diet - low sodium heart healthy   Complete by: As directed    Incentive spirometry RT   Complete by: As directed    Increase activity slowly   Complete by: As directed      Allergies as of 01/10/2019      Reactions   Amoxil  [amoxicillin] Swelling, Other (See Comments)   Lips Has patient had a PCN reaction causing immediate rash, facial/tongue/throat swelling, SOB or lightheadedness with hypotension:YES Has patient had a PCN reaction causing severe rash involving mucus membranes or skin necrosis:No Has patient had a PCN reaction that required hospitalization:No Has patient had a PCN reaction occurring within the last 10 years:Yes. If all of the above answers are "NO", then may proceed with Cephalosporin use.   Lisinopril Itching, Swelling   SWELLING REACTION UNSPECIFIED    Chocolate Hives   Demerol Nausea And Vomiting   Other Other (See Comments)   Raw food or nuts - GI pain      Medication List    TAKE these medications   aspirin EC 81 MG tablet Take 81 mg by mouth at bedtime.   BD Pen Needle Micro U/F 32G X 6 MM Misc Generic drug: Insulin Pen Needle 4 TIMES A DAY NOVOLOG(3) AND LANTUS9(1)   Calcium 600+D3 600-200 MG-UNIT Tabs Generic drug: Calcium Carb-Cholecalciferol Take 1 tablet by mouth at bedtime.   Cranberry 450 MG Caps Take 900 mg by mouth 2 (two) times daily.   Dexcom G6 Receiver Devi 1 Device by Does not apply route daily.   Dexcom G6 Sensor Misc 1 kit by Does not apply route as directed.   Dexcom G6 Transmitter Misc 1 kit by Does not apply route as directed.   digoxin 0.125 MG tablet Commonly known as: Lanoxin Take 1 tablet (0.125 mg total) by mouth daily. Patient needs appointment for further refills   docusate sodium 100 MG   capsule Commonly known as: COLACE Take 100 mg by mouth daily.   DULoxetine 30 MG capsule Commonly known as: Cymbalta Take 1 capsule (30 mg total) by mouth daily. What changed: when to take this   ezetimibe 10 MG tablet Commonly known as: ZETIA TAKE 1 TABLET BY MOUTH EVERY DAY   glucose blood test strip Use as instructed to test blood sugars 3 times daily DX.E11.42   insulin glargine 100 UNIT/ML injection Commonly known as: LANTUS Inject 20  Units into the skin daily.   meloxicam 15 MG tablet Commonly known as: MOBIC Take 15 mg by mouth daily.   metoprolol succinate 25 MG 24 hr tablet Commonly known as: TOPROL-XL Take 1 tablet (25 mg total) by mouth daily. Patient needs to schedule an appointment for an additional refill   NovoLOG FlexPen 100 UNIT/ML FlexPen Generic drug: insulin aspart Inject 10 Units into the skin daily with breakfast AND 14 Units daily with lunch AND 14 Units daily with supper. What changed: See the new instructions.   nystatin powder Commonly known as: MYCOSTATIN/NYSTOP Apply topically 3 (three) times daily as needed.   omeprazole 20 MG capsule Commonly known as: PRILOSEC Take 1 capsule (20 mg total) by mouth daily.   ondansetron 4 MG tablet Commonly known as: Zofran Take 1 tablet (4 mg total) by mouth every 8 (eight) hours as needed for nausea or vomiting.   oxyCODONE-acetaminophen 5-325 MG tablet Commonly known as: PERCOCET/ROXICET Take 1-2 tablets by mouth every 4 (four) hours as needed for moderate pain or severe pain. What changed:   how much to take  when to take this  reasons to take this   PRESERVISION AREDS 2 PO Take 1 tablet by mouth 2 (two) times daily.   ramipril 10 MG capsule Commonly known as: ALTACE Take 1 capsule (10 mg total) by mouth every evening.   RELION INSULIN SYRINGE 1ML/31G 31G X 5/16" 1 ML Misc Generic drug: Insulin Syringe-Needle U-100 Use as directed   rOPINIRole 1 MG tablet Commonly known as: REQUIP TAKE ONE TABLET BY MOUTH AT BEDTIME.   tamsulosin 0.4 MG Caps capsule Commonly known as: FLOMAX TAKE 1 CAPSULE BY MOUTH EVERY DAY   Turmeric 500 MG Tabs Take 500 mg by mouth 2 (two) times daily.   verapamil 120 MG tablet Commonly known as: CALAN Take 120 mg by mouth daily as needed (afib).   verapamil 240 MG 24 hr capsule Commonly known as: VERELAN PM Take 1 capsule (240 mg total) by mouth daily.   VITAMIN D PO Take 10,000 Units by mouth  daily.        Signed: Blanchie Dessert Joline Encalada 01/10/2019, 12:02 PM

## 2019-01-10 NOTE — Progress Notes (Signed)
Occupational Therapy Treatment Patient Details Name: [name] MRN: [id number] DOB: 08-Oct-1940 Today's Date: 01/10/2019    History of present illness Pt is a 78 y/o female with herniated nucleus pulposus T10-11 with throacic myelopathy, s/p PLIF T10-11. PMH: depression, anxiety, HTN, fibromyalgia, pneumonia, spinal cord compression, DM2, ACDF, posterior cervical fusion.    OT comments  Pt making steady progress towards OT goals this session. Session focus on functional mobility and toilet tasks. Overall, pt min guard- MIN A for all functional mobility with RW. Pt required MIN A for toilet transfer via functional mobility with RW and total A for hygiene. Pt has caregivers at home to assist with all ADLs/ IADLS. Reviewed back precautions with pt verbalizing understanding. [name] to don<>doff brace EOB. DC plan remains appropriate, will continue to follow acutely per POC.     Follow Up Recommendations  Home health OT;Supervision/Assistance - 24 hour    Equipment Recommendations  None recommended by OT    Recommendations for Other Services      Precautions / Restrictions Precautions Precautions: Back;Fall Precaution Booklet Issued: Yes (comment) Precaution Comments: reviewed with patient  Required Braces or Orthoses: Spinal Brace Spinal Brace: Thoracolumbosacral orthotic;Applied in sitting position Restrictions Weight Bearing Restrictions: No       Mobility Bed Mobility Overal bed mobility: Needs Assistance Bed Mobility: Sidelying to Sit;Rolling Rolling: Supervision Sidelying to sit: Min guard       General bed mobility comments: cueing for log roll technique  Transfers Overall transfer level: Needs assistance Equipment used: Rolling walker (2 wheeled) Transfers: Sit to/from Stand Sit to Stand: Min guard;Min assist         General transfer comment: pt required min guard for sit>stand, insistent on performing independently noted to complete with  increased effort x2 trials before coming into full stand; MIN A for low toilet    Balance Overall balance assessment: Needs assistance Sitting-balance support: No upper extremity supported;Feet supported Sitting balance-[name]: Fair     Standing balance support: Bilateral upper extremity supported;During functional activity Standing balance-[name]: Poor Standing balance comment: relaint on BUE support                           ADL either performed or assessed with clinical judgement   ADL Overall ADL's : Needs assistance/impaired                 Upper Body Dressing : Total assistance;Sitting Upper Body Dressing Details (indicate cue type and reason): to don TLSO Lower Body Dressing: Maximal assistance;Sit to/from stand Lower Body Dressing Details (indicate cue type and reason): requires assist for socks, min assist sit to stand , reports caregivers usually assist with task Toilet Transfer: Minimal assistance;Ambulation;RW;Regular Glass blower/designer Details (indicate cue type and reason): MIN A for balance; pt noted to have some retropulsion upon initial sit>stand Toileting- Clothing Manipulation and Hygiene: Total assistance;Sit to/from stand Toileting - Clothing Manipulation Details (indicate cue type and reason): total A for all hygiene reports badea at home     Functional mobility during ADLs: Minimal assistance;Rolling walker General ADL Comments: pt limited by decreased functional use of B hands, impaired balance, weakness. Pt complete functional mobility with RW houshold distances with MIN A for balance. [name] upon initial sit>stand needing MIN A to self- correct. Pt complete toileting with MIN A, total A for hygiene. Reviewed all back precautions with pt verbalizing understanding  Vision   Vision Assessment?: No apparent visual deficits   Perception     Praxis      Cognition Arousal/Alertness:  Awake/alert Behavior During Therapy: WFL for tasks assessed/performed Overall Cognitive Status: Within Functional Limits for tasks assessed                                          Exercises     Shoulder Instructions       General Comments      Pertinent Vitals/ Pain       Pain Assessment: 0-10 Pain Score: 4  Pain Location: back Pain Descriptors / Indicators: Discomfort;Operative site guarding Pain Intervention(s): Monitored during session;Repositioned;Patient requesting pain meds-RN notified  Home Living                                          Prior Functioning/Environment              Frequency  Min 2X/week        Progress Toward Goals  OT Goals(current goals can now be found in the care plan section)  Progress towards OT goals: Progressing toward goals  Acute Rehab OT Goals Patient Stated Goal: to get home soon OT Goal Formulation: With patient Time For Goal Achievement: 01/23/19 Potential to Achieve Goals: Good  Plan Discharge plan remains appropriate    Co-evaluation                 AM-PAC OT "6 Clicks" Daily Activity     Outcome Measure   Help from another person eating meals?: A Little Help from another person taking care of personal grooming?: A Little Help from another person toileting, which includes using toliet, bedpan, or urinal?: A Lot Help from another person bathing (including washing, rinsing, drying)?: A Lot Help from another person to put on and taking off regular upper body clothing?: A Lot Help from another person to put on and taking off regular lower body clothing?: Total 6 Click Score: 13    End of Session Equipment Utilized During Treatment: Rolling walker;Back brace  OT Visit Diagnosis: Other abnormalities of gait and mobility (R26.89);Muscle weakness (generalized) (M62.81);Pain   Activity Tolerance Patient tolerated treatment well   Patient Left in chair;with call bell/phone  within reach   Nurse Communication Mobility status;Other (comment)(voided in toilet, requesting pain meds)        Time: 0832-0900 OT Time Calculation (min): 28 min  Charges: OT General Charges $OT Visit: 1 Visit OT Treatments $Self Care/Home Management : 23-37 mins  Lanier Clam., COTA/L Acute Rehabilitation Services [phone]   Ihor Gully 01/10/2019, 9:10 AM

## 2019-01-10 NOTE — TOC Transition Note (Addendum)
Transition of Care [facility]) - CM/SW Discharge Note   Patient Details  Name: [name] MRN: [id number] Date of Birth: 10-Jul-1940  Transition of [facility]) CM/SW Contact:  Midge Minium MSN, RN, NCM-BC, ACM-RN [phone] Phone Number: 01/10/2019, 3:29 PM   Clinical Narrative:    CM following for dispositional needs. CM spoke to the patient and her daughter, [name], to discuss the POC. Patient lives at home alone with assistance provided by her caregivers; assistive devices available to assist during ambulation. Patient is a 78 y/o female with herniated nucleus pulposus T10-11 with throacic myelopathy, s/p PLIF T10-11. PMH: depression, anxiety, HTN, fibromyalgia, pneumonia, spinal cord compression, DM2, ACDF, posterior cervical fusion. PT/OT eval completed with HHPT/OT recommended, with the patient/daughter agreeable. CMS HH compare list provided with [facility] selected; if [facility] unable to accept the referral, both agreeable to an agency in network with the patients insurance. [place] referral given to Blair Heys, [facility] liaison and Tiffany RN [facility] liaison) with neither agencies able to accept the referral. [place] referral discussed with Tommi Rumps RN [facility] liaison); AVS updated. Patients daughter provided transportation home.    Final next level of care: [place] Barriers to Discharge: No Barriers Identified   Patient Goals and CMS Choice Patient states their goals for this hospitalization and ongoing recovery are:: "to go home" CMS Medicare.gov Compare Post Acute Care list provided to:: Patient Choice offered to / list presented to : Patient   Discharge Plan and Services                DME Arranged: N/A DME Agency: NA       HH Arranged: PT, OT [place] Agency: [facility] [employer]) Date [place]: 01/10/19 Time [place]: 1508 Representative spoke with at [place]: [place] (liaison)  Social Determinants of Health ([place])  Interventions     Readmission Risk Interventions No flowsheet data found.

## 2019-01-11 LAB — TYPE AND SCREEN
ABO/RH(D): A NEG
Antibody Screen: POSITIVE
Unit division: 0
Unit division: 0

## 2019-01-11 LAB — BPAM RBC
Blood Product Expiration Date: 202012222359
Blood Product Expiration Date: 202012222359
Unit Type and Rh: 600
Unit Type and Rh: 600

## 2019-01-14 DIAGNOSIS — Z7982 Long term (current) use of aspirin: Secondary | ICD-10-CM | POA: Diagnosis not present

## 2019-01-14 DIAGNOSIS — F419 Anxiety disorder, unspecified: Secondary | ICD-10-CM | POA: Diagnosis not present

## 2019-01-14 DIAGNOSIS — M797 Fibromyalgia: Secondary | ICD-10-CM | POA: Diagnosis not present

## 2019-01-14 DIAGNOSIS — Z96652 Presence of left artificial knee joint: Secondary | ICD-10-CM | POA: Diagnosis not present

## 2019-01-14 DIAGNOSIS — M519 Unspecified thoracic, thoracolumbar and lumbosacral intervertebral disc disorder: Secondary | ICD-10-CM | POA: Diagnosis not present

## 2019-01-14 DIAGNOSIS — Z981 Arthrodesis status: Secondary | ICD-10-CM | POA: Diagnosis not present

## 2019-01-14 DIAGNOSIS — Z9181 History of falling: Secondary | ICD-10-CM | POA: Diagnosis not present

## 2019-01-14 DIAGNOSIS — F329 Major depressive disorder, single episode, unspecified: Secondary | ICD-10-CM | POA: Diagnosis not present

## 2019-01-14 DIAGNOSIS — I10 Essential (primary) hypertension: Secondary | ICD-10-CM | POA: Diagnosis not present

## 2019-01-14 DIAGNOSIS — S14109D Unspecified injury at unspecified level of cervical spinal cord, subsequent encounter: Secondary | ICD-10-CM | POA: Diagnosis not present

## 2019-01-14 DIAGNOSIS — Z79891 Long term (current) use of opiate analgesic: Secondary | ICD-10-CM | POA: Diagnosis not present

## 2019-01-14 DIAGNOSIS — Z4789 Encounter for other orthopedic aftercare: Secondary | ICD-10-CM | POA: Diagnosis not present

## 2019-01-14 DIAGNOSIS — G8250 Quadriplegia, unspecified: Secondary | ICD-10-CM | POA: Diagnosis not present

## 2019-01-14 DIAGNOSIS — E119 Type 2 diabetes mellitus without complications: Secondary | ICD-10-CM | POA: Diagnosis not present

## 2019-01-14 DIAGNOSIS — G959 Disease of spinal cord, unspecified: Secondary | ICD-10-CM | POA: Diagnosis not present

## 2019-01-14 DIAGNOSIS — Z791 Long term (current) use of non-steroidal anti-inflammatories (NSAID): Secondary | ICD-10-CM | POA: Diagnosis not present

## 2019-01-14 DIAGNOSIS — Z8701 Personal history of pneumonia (recurrent): Secondary | ICD-10-CM | POA: Diagnosis not present

## 2019-01-14 DIAGNOSIS — Z794 Long term (current) use of insulin: Secondary | ICD-10-CM | POA: Diagnosis not present

## 2019-01-18 ENCOUNTER — Other Ambulatory Visit: Payer: Self-pay | Admitting: Family Medicine

## 2019-01-18 ENCOUNTER — Telehealth: Payer: PPO | Admitting: Family

## 2019-01-18 DIAGNOSIS — R300 Dysuria: Secondary | ICD-10-CM | POA: Diagnosis not present

## 2019-01-18 MED ORDER — NITROFURANTOIN MONOHYD MACRO 100 MG PO CAPS: 100.0000 mg | ORAL_CAPSULE | Freq: Two times a day (BID) | ORAL | 0 refills | Status: DC

## 2019-01-18 NOTE — Progress Notes (Signed)
We are sorry that you are not feeling well.  Here is how we plan to help!  Based on what you shared with me it looks like you most likely have a simple urinary tract infection.  A UTI (Urinary Tract Infection) is a bacterial infection of the bladder.  Most cases of urinary tract infections are simple to treat but a key part of your care is to encourage you to drink plenty of fluids and watch your symptoms carefully.  I have prescribed MacroBid 100 mg twice a day for 5 days.  Your symptoms should gradually improve. Call us if the burning in your urine worsens, you develop worsening fever, back pain or pelvic pain or if your symptoms do not resolve after completing the antibiotic.  Urinary tract infections can be prevented by drinking plenty of water to keep your body hydrated.  Also be sure when you wipe, wipe from front to back and don't hold it in!  If possible, empty your bladder every 4 hours.  Your e-visit answers were reviewed by a board certified advanced clinical practitioner to complete your personal care plan.  Depending on the condition, your plan could have included both over the counter or prescription medications.  If there is a problem please reply  once you have received a response from your provider.  Your safety is important to us.  If you have drug allergies check your prescription carefully.    You can use MyChart to ask questions about today's visit, request a non-urgent call back, or ask for a work or school excuse for 24 hours related to this e-Visit. If it has been greater than 24 hours you will need to follow up with your provider, or enter a new e-Visit to address those concerns.   You will get an e-mail in the next two days asking about your experience.  I hope that your e-visit has been valuable and will speed your recovery. Thank you for using e-visits.  Greater than 5 minutes, yet less than 10 minutes of time have been spent researching, coordinating, and  implementing care for this patient today.  Thank you for the details you included in the comment boxes. Those details are very helpful in determining the best course of treatment for you and help us to provide the best care.  

## 2019-01-23 ENCOUNTER — Ambulatory Visit: Payer: PPO | Admitting: Physical Medicine & Rehabilitation

## 2019-01-25 ENCOUNTER — Encounter: Payer: Self-pay | Admitting: Family Medicine

## 2019-01-25 ENCOUNTER — Ambulatory Visit (INDEPENDENT_AMBULATORY_CARE_PROVIDER_SITE_OTHER): Payer: PPO | Admitting: Family Medicine

## 2019-01-25 DIAGNOSIS — N3000 Acute cystitis without hematuria: Secondary | ICD-10-CM

## 2019-01-25 MED ORDER — CIPROFLOXACIN HCL 250 MG PO TABS: 250.0000 mg | ORAL_TABLET | Freq: Two times a day (BID) | ORAL | 0 refills | Status: DC

## 2019-01-25 NOTE — Progress Notes (Signed)
[place] at [facility] [address] [place], [place] [address] [phone] [id number]  Date:  01/25/2019   Name:  [name]   DOB:  Jun 21, 1940   MRN:  [id number]  PCP:  [name], [name], MD    Chief Complaint: No chief complaint on file.   History of Present Illness:  [name] is a 78 y.o. very pleasant female patient who presents with the following:  Primary patient of Dr [name], history of left TKA, DM, a fib Just recently she underwent a thoracic decompression with discectomy at T10/T11 per Dr. [name].  Her surgery took place on November 25 She is still using a wheelchair to get around, she requires care really around-the-clock Pt, her daughter [name] and myself are on the call today Pt is at home, provider is at office Patient identity confirmed with 2 symptoms, she gives consent for virtual visit today Pt notes sx of cystitis with frequent urination and pain- her sx started about a week ago and have been getting worse She was having such severe pain she was wetting her bed last night She did not see any gross blood in her urine  She was given 5 days of macrobid via evisit on 12/5- no urine culture done was done They noted her symptoms did seem to get better, but then returned when she finished the antibiotic  She has baseline nerve damage in her hands from previous spine problems, I think there may be some element of bladder dysfunction as well Glucose about 190  Per her daughter she does not seem toxic or otherwise ill.  They think this is a UTI similar to what she has had in the past.  However given her recent neurosurgical procedure is very difficult for her to get into the office for treatment Patient Active Problem List   Diagnosis Date Noted  . Thoracic myelopathy 01/08/2019  . Cervical spine fracture ([place]) 01/22/2018  . Weakness 01/21/2018  . Closed C7 fracture ([place]) 01/21/2018  . Fall   . Lower urinary tract  infectious disease   . Cervical spondylosis with myelopathy 11/12/2017  . Tinnitus of both ears 10/19/2017  . Rhinorrhea 09/26/2017  . Chronic diarrhea 08/08/2017  . S/P left TKA 04/10/2017  . Unstable gait 01/12/2017  . Generalized osteoarthritis of hand 01/12/2017  . RLS (restless legs syndrome) 11/09/2016  . GERD (gastroesophageal reflux disease) 07/13/2016  . Class 2 severe obesity due to excess calories with serious comorbidity and body mass index (BMI) of 36.0 to 36.9 in adult ([place]) 06/01/2016  . Vitamin D deficiency 06/01/2016  . Vitamin B12 deficiency 03/27/2016  . Chronic cough 03/16/2016  . Left foot pain 03/16/2016  . Demand ischemia ([place]) 02/14/2015  . Degenerative drusen 07/13/2013  . Type 2 diabetes mellitus without ophthalmic manifestations ([place]) 07/13/2013  . DM (diabetes mellitus) type II controlled, neurological manifestation ([place]) 05/07/2012  . Paroxysmal atrial tachycardia ([place]) 05/07/2012  . Essential hypertension, benign 05/07/2012  . Depression 05/07/2012  . Hyperlipidemia 05/07/2012  . Fibromyalgia 05/07/2012    Past Medical History:  Diagnosis Date  . Anemia    during 1st pregnancy  . Anxiety   . Arthritis   . Cancer ([place])    Basal - face  . Complication of anesthesia   . Depression   . Diarrhea, functional   . Dysrhythmia    Paroysmal Atrial Tachycardia  . Essential hypertension   . Fibromyalgia   . GERD (gastroesophageal  reflux disease)   . H/O acute pancreatitis   . Headache   . History of hiatal hernia   . Hyperlipemia   . Neuropathy   . Osteoporosis   . Panic attacks   . Paralysis ([place]) 01/06/2019   hands  . PAT (paroxysmal atrial tachycardia) ([place])   . Pneumonia   . PONV (postoperative nausea and vomiting)   . PSVT (paroxysmal supraventricular tachycardia) ([place])   . Reflux   . Restless leg syndrome   . Spinal cord compression ([place])   . Type 2 diabetes mellitus ([facility])    Type II  . Vertigo    not current    Past Surgical  History:  Procedure Laterality Date  . ABDOMINAL HYSTERECTOMY    . ANKLE SURGERY Left    x 2  . ANTERIOR CERVICAL DECOMP/DISCECTOMY FUSION N/A 11/12/2017   Procedure: Cervical seven to Thoracic one  Anterior cervical decompression/discectomy/fusion with removal of old plate;  Surgeon: [name], MD;  Location: [facility];  Service: Neurosurgery;  Laterality: N/A;  . BACK SURGERY     T12-L1 fusion, Anterior Cervical fusion  . CARDIAC CATHETERIZATION N/A 02/16/2015   Procedure: Left Heart Cath and Coronary Angiography;  Surgeon: Sherren Mocha, MD;  Location: [facility];  Service: Cardiovascular;  Laterality: N/A;  . CHOLECYSTECTOMY    . COLONOSCOPY    . ESOPHAGOGASTRODUODENOSCOPY  12/21/2010   Procedure: ESOPHAGOGASTRODUODENOSCOPY (EGD);  Surgeon: Landry Dyke, MD;  Location: [facility];  Service: Endoscopy;  Laterality: N/A;  . EXCISION MORTON'S NEUROMA Right   . EYE SURGERY Bilateral    Cataract  . FRACTURE SURGERY     ankle x 2   . KNEE SURGERY Right    x 3- arthroscopy  . NECK SURGERY    . POSTERIOR CERVICAL FUSION/FORAMINOTOMY N/A 01/22/2018   Procedure: Cervical six-seven Posterior Decompression with Posterior Fixation Cervical Five to Thoracic Two;  Surgeon: [name], MD;  Location: [facility];  Service: Neurosurgery;  Laterality: N/A;  Cervical 6-7 Posteror decompression with posterior fixation Cervical 5 to Thoracic 2  . ROTATOR CUFF REPAIR Right   . TONSILLECTOMY    . TOTAL KNEE ARTHROPLASTY Left 04/10/2017   Procedure: LEFT TOTAL KNEE ARTHROPLASTY;  Surgeon: Paralee Cancel, MD;  Location: [facility];  Service: Orthopedics;  Laterality: Left;  70 mins  . UPPER GASTROINTESTINAL ENDOSCOPY    . WRIST SURGERY Left    fracture    Social History   Tobacco Use  . Smoking status: Former Research scientist (life sciences)  . Smokeless tobacco: Never Used  . Tobacco comment: smoke 1 cigarette every few weeks when the [place] lady came by.  Substance Use Topics  . Alcohol use: No  . Drug use: No     Family History  Problem Relation Age of Onset  . Heart failure Mother   . Hyperlipidemia Mother   . Lung cancer Father        Lymphoma  . Hyperlipidemia Maternal Grandmother   . Peripheral vascular disease Maternal Grandmother     Allergies  Allergen Reactions  . Amoxil [Amoxicillin] Swelling and Other (See Comments)    Lips Has patient had a PCN reaction causing immediate rash, facial/tongue/throat swelling, SOB or lightheadedness with hypotension:YES Has patient had a PCN reaction causing severe rash involving mucus membranes or skin necrosis:No Has patient had a PCN reaction that required hospitalization:No Has patient had a PCN reaction occurring within the last 10 years:Yes. If all of the above answers are "NO", then may proceed with Cephalosporin use.   Marland Kitchen  Lisinopril Itching and Swelling    SWELLING REACTION UNSPECIFIED   . Chocolate Hives  . Demerol Nausea And Vomiting  . Other Other (See Comments)    Raw food or nuts - GI pain    Medication list has been reviewed and updated.  Current Outpatient Medications on File Prior to Visit  Medication Sig Dispense Refill  . aspirin EC 81 MG tablet Take 81 mg by mouth at bedtime.     . BD PEN NEEDLE MICRO U/F 32G X 6 MM MISC 4 TIMES A DAY NOVOLOG(3) AND LANTUS9(1) 100 each 6  . Calcium Carb-Cholecalciferol (CALCIUM 600+D3) 600-200 MG-UNIT TABS Take 1 tablet by mouth at bedtime.     . Continuous Blood Gluc Receiver (DEXCOM G6 RECEIVER) DEVI 1 Device by Does not apply route daily. 1 Device 0  . Continuous Blood Gluc Sensor (DEXCOM G6 SENSOR) MISC 1 kit by Does not apply route as directed. 3 each 3  . Continuous Blood Gluc Transmit (DEXCOM G6 TRANSMITTER) MISC 1 kit by Does not apply route as directed. 1 each 3  . Cranberry 450 MG CAPS Take 900 mg by mouth 2 (two) times daily.     . digoxin (LANOXIN) 0.125 MG tablet Take 1 tablet (0.125 mg total) by mouth daily. Patient needs appointment for further refills 90 tablet 3  .  docusate sodium (COLACE) 100 MG capsule Take 100 mg by mouth daily.    . DULoxetine (CYMBALTA) 30 MG capsule Take 1 capsule (30 mg total) by mouth daily. (Patient taking differently: Take 30 mg by mouth at bedtime. ) 90 capsule 2  . ezetimibe (ZETIA) 10 MG tablet TAKE 1 TABLET BY MOUTH EVERY DAY (Patient taking differently: Take 10 mg by mouth daily. ) 90 tablet 1  . glucose blood test strip Use as instructed to test blood sugars 3 times daily DX.E11.42 100 each 12  . insulin aspart (NOVOLOG FLEXPEN) 100 UNIT/ML FlexPen Inject 10 Units into the skin daily with breakfast AND 14 Units daily with lunch AND 14 Units daily with supper. (Patient taking differently: Inject 8 Units into the skin daily with breakfast, 10 Units daily with lunch and 10 Units daily with supper.) 15 mL 11  . insulin glargine (LANTUS) 100 UNIT/ML injection Inject 20 Units into the skin daily.     . meloxicam (MOBIC) 15 MG tablet Take 15 mg by mouth daily.     . metoprolol succinate (TOPROL-XL) 25 MG 24 hr tablet Take 1 tablet (25 mg total) by mouth daily. Patient needs to schedule an appointment for an additional refill 90 tablet 3  . Multiple Vitamins-Minerals (PRESERVISION AREDS 2 PO) Take 1 tablet by mouth 2 (two) times daily.     . nitrofurantoin, macrocrystal-monohydrate, (MACROBID) 100 MG capsule Take 1 capsule (100 mg total) by mouth 2 (two) times daily. 10 capsule 0  . nystatin (MYCOSTATIN/NYSTOP) powder Apply topically 3 (three) times daily as needed. (Patient not taking: Reported on 01/03/2019) 15 g 6  . omeprazole (PRILOSEC) 20 MG capsule Take 1 capsule (20 mg total) by mouth daily. 90 capsule 2  . ondansetron (ZOFRAN) 4 MG tablet Take 1 tablet (4 mg total) by mouth every 8 (eight) hours as needed for nausea or vomiting. (Patient not taking: Reported on 01/03/2019) 20 tablet 0  . oxyCODONE-acetaminophen (PERCOCET/ROXICET) 5-325 MG tablet Take 1-2 tablets by mouth every 4 (four) hours as needed for moderate pain or severe  pain. 60 tablet 0  . ramipril (ALTACE) 10 MG capsule Take 1 capsule (10  mg total) by mouth every evening. 90 capsule 2  . RELION INSULIN SYRINGE 1ML/31G 31G X 5/16" 1 ML MISC Use as directed 100 each 1  . rOPINIRole (REQUIP) 1 MG tablet TAKE ONE TABLET BY MOUTH AT BEDTIME. (Patient taking differently: Take 1 mg by mouth at bedtime. ) 90 tablet 1  . tamsulosin (FLOMAX) 0.4 MG CAPS capsule TAKE 1 CAPSULE BY MOUTH EVERY DAY 90 capsule 0  . Turmeric 500 MG TABS Take 500 mg by mouth 2 (two) times daily.     . verapamil (CALAN) 120 MG tablet Take 120 mg by mouth daily as needed (afib).     . verapamil (VERELAN PM) 240 MG 24 hr capsule Take 1 capsule (240 mg total) by mouth daily. 90 capsule 3  . VITAMIN D PO Take 10,000 Units by mouth daily.      No current facility-administered medications on file prior to visit.    Review of Systems:  As per HPI- otherwise negative. She denies any other systemic symptoms besides her urinary symptoms at this time  Physical Examination: There were no vitals filed for this visit. There were no vitals filed for this visit. There is no height or weight on file to calculate BMI. Ideal Body Weight:    Patient observed over video today.  Older woman sitting in chair with clamshell brace, she otherwise looks well.  She does not seem confused, no cough, wheezing, distress is noted  Assessment and Plan: Acute cystitis without hematuria - Plan: ciprofloxacin (CIPRO) 250 MG tablet, Urine culture  Virtual visit today for presumed cystitis.  Reviewed her most recent urine culture from September, E. coli resistant to Septra.  She is allergic to penicillins.  She recently failed a course of Macrobid.  Although not ideal, will use Cipro for her current infection.  Explained blackbox warning to patient and her daughter.  I asked them to collect a urine sample at home and a clean container prior to start antibiotics, place in refrigerator.  The patient has an appoint with her  PCP on Monday, they will bring your to that appointment to be sent for culture  They will let me know if getting worse in the meantime.  Patient states she often needs a prolonged course of antibiotics to clear up UTI.  I gave her 10 days worth of Cipro, advised they may stop use after 5 to 7 days assuming resolution of symptoms  Signed Lamar Blinks, MD

## 2019-01-27 ENCOUNTER — Other Ambulatory Visit: Payer: Self-pay

## 2019-01-27 ENCOUNTER — Encounter: Payer: Self-pay | Admitting: Family Medicine

## 2019-01-27 ENCOUNTER — Ambulatory Visit (INDEPENDENT_AMBULATORY_CARE_PROVIDER_SITE_OTHER): Payer: PPO | Admitting: Family Medicine

## 2019-01-27 VITALS — BP 128/70 | HR 56 | Resp 16 | Ht 64.0 in

## 2019-01-27 DIAGNOSIS — N3000 Acute cystitis without hematuria: Secondary | ICD-10-CM | POA: Diagnosis not present

## 2019-01-27 DIAGNOSIS — R350 Frequency of micturition: Secondary | ICD-10-CM | POA: Diagnosis not present

## 2019-01-27 DIAGNOSIS — I10 Essential (primary) hypertension: Secondary | ICD-10-CM | POA: Diagnosis not present

## 2019-01-27 DIAGNOSIS — M797 Fibromyalgia: Secondary | ICD-10-CM

## 2019-01-27 DIAGNOSIS — T8130XA Disruption of wound, unspecified, initial encounter: Secondary | ICD-10-CM

## 2019-01-27 DIAGNOSIS — L304 Erythema intertrigo: Secondary | ICD-10-CM

## 2019-01-27 DIAGNOSIS — M4714 Other spondylosis with myelopathy, thoracic region: Secondary | ICD-10-CM

## 2019-01-27 MED ORDER — MIRABEGRON ER 25 MG PO TB24: 25.0000 mg | ORAL_TABLET | Freq: Every day | ORAL | 2 refills | Status: DC

## 2019-01-27 NOTE — Patient Instructions (Signed)
A few things to remember from today's visit:   Essential hypertension, benign  Acute cystitis without hematuria - Plan: Urine culture  Fibromyalgia  Urinary frequency - Plan: mirabegron ER (MYRBETRIQ) 25 MG TB24 tablet  Wound dehiscence  Because flomax is not helping, stop and try Myrbetriq 25 mg daily. Pending urine culture. Continue PT, if more time is needed please request service. Fall precautions.  No changes in Cymbalta. Keep diaper off at night and back wound clean with soap and water. We may need wound care added to [facility] service.   Please be sure medication list is accurate. If a new problem present, please set up appointment sooner than planned today.

## 2019-01-27 NOTE — Progress Notes (Signed)
HPI:   [name] is a 78 y.o. female, who is here today with her daughter for follow up.   She was last seen on 09/24/18. Since her last visit she has had some health problems. Hospitalized from 01/08/19 to 01/10/19. Thoracic myelopathy secondary to herniated disc,severe. On 01/08/19 she underwent urgent surgical decompression at [facility]. She was discharged home, daughter is helping with care and [name] services have been arranged.  She also has hx of cervical myelopathy.  She has Hx of back pain and according to daughter pain was getting worse, oral meds and epidural injection failed to control pain. Thoracic MRI was obtained, I do not have report but daughter has imaging on her phone.   Daughter is concerned about surgical wound not healing as fast as she was hoping. No fever or chills. + Erythema and yellowish areas on wound. No worsening pain.  OT and PT each one 2 times per week. She has an aid that stays with her. She needs 24/7 care. No falls sine [name] discharge. She is using a walker at home Recently dx;ed with cystitis, completed Macrobid treatment and currently she is on Ciprofloxacin 250 mg bid to compete 10 days. She is not longer having dysuria but has urinary frequency. Getting up to void ever 15-30 min. +Urine incontinence. She saw some pink decoloration on diaper, has not seen blood in toiler after urination. Negative for vaginal discharge,pruritus,or bleeding.  She has been on Flomax for a few months, it was started at [name] for urinary frequency.  Negative for diarrhea. She was having some constipation, seems resolved. Last bowel movement today.  HTN: On Ramipril 10 mg daily,Verapamil 120 mg daily as needed, and Metoprolol Succinate 25 mg daily.  Hx of paroxysmal atrial tach, she has not had palpitations,CP,SOB.  Fibromyalgia pain improved with Cymbalta 30 mg daily. She feels like she is now dealing well with health  issues/stress. Tolerating med well.  Also c/o inguinal erythema,no pruritus. Hx of intertrigo under breast,has resolved.  DM II, BS's from 120's (FG)-200's (post prandial). No hypoglycemic events.     Review of Systems  Constitutional: Positive for fatigue. Negative for activity change, appetite change and fever.  HENT: Negative for mouth sores, nosebleeds and sore throat.   Eyes: Negative for redness and visual disturbance.  Respiratory: Negative for cough, shortness of breath and wheezing.   Cardiovascular: Negative for leg swelling.  Gastrointestinal: Negative for abdominal pain, nausea and vomiting.  Endocrine: Negative for polydipsia, polyphagia and polyuria.  Genitourinary: Negative for decreased urine volume and genital sores.  Musculoskeletal: Positive for arthralgias, back pain and gait problem.  Neurological: Negative for syncope and headaches.  Psychiatric/Behavioral: Positive for sleep disturbance. Negative for confusion.  Rest of ROS, see pertinent positives sand negatives in HPI   Current Outpatient Medications on File Prior to Visit  Medication Sig Dispense Refill  . aspirin EC 81 MG tablet Take 81 mg by mouth at bedtime.     . BD PEN NEEDLE MICRO U/F 32G X 6 MM MISC 4 TIMES A DAY NOVOLOG(3) AND LANTUS9(1) 100 each 6  . Calcium Carb-Cholecalciferol (CALCIUM 600+D3) 600-200 MG-UNIT TABS Take 1 tablet by mouth at bedtime.     . ciprofloxacin (CIPRO) 250 MG tablet Take 1 tablet (250 mg total) by mouth 2 (two) times daily. 20 tablet 0  . Continuous Blood Gluc Receiver (DEXCOM G6 RECEIVER) DEVI 1 Device by Does not apply route daily. 1 Device 0  . Continuous Blood Gluc  Sensor (DEXCOM G6 SENSOR) MISC 1 kit by Does not apply route as directed. 3 each 3  . Continuous Blood Gluc Transmit (DEXCOM G6 TRANSMITTER) MISC 1 kit by Does not apply route as directed. 1 each 3  . Cranberry 450 MG CAPS Take 900 mg by mouth 2 (two) times daily.     . digoxin (LANOXIN) 0.125 MG tablet  Take 1 tablet (0.125 mg total) by mouth daily. Patient needs appointment for further refills 90 tablet 3  . docusate sodium (COLACE) 100 MG capsule Take 100 mg by mouth daily.    . DULoxetine (CYMBALTA) 30 MG capsule Take 1 capsule (30 mg total) by mouth daily. (Patient taking differently: Take 30 mg by mouth at bedtime. ) 90 capsule 2  . ezetimibe (ZETIA) 10 MG tablet TAKE 1 TABLET BY MOUTH EVERY DAY (Patient taking differently: Take 10 mg by mouth daily. ) 90 tablet 1  . glucose blood test strip Use as instructed to test blood sugars 3 times daily DX.E11.42 100 each 12  . insulin aspart (NOVOLOG FLEXPEN) 100 UNIT/ML FlexPen Inject 10 Units into the skin daily with breakfast AND 14 Units daily with lunch AND 14 Units daily with supper. (Patient taking differently: Inject 8 Units into the skin daily with breakfast, 10 Units daily with lunch and 10 Units daily with supper.) 15 mL 11  . insulin glargine (LANTUS) 100 UNIT/ML injection Inject 20 Units into the skin daily.     . meloxicam (MOBIC) 15 MG tablet Take 15 mg by mouth daily.     . metoprolol succinate (TOPROL-XL) 25 MG 24 hr tablet Take 1 tablet (25 mg total) by mouth daily. Patient needs to schedule an appointment for an additional refill 90 tablet 3  . Multiple Vitamins-Minerals (PRESERVISION AREDS 2 PO) Take 1 tablet by mouth 2 (two) times daily.     Marland Kitchen nystatin (MYCOSTATIN/NYSTOP) powder Apply topically 3 (three) times daily as needed. 15 g 6  . omeprazole (PRILOSEC) 20 MG capsule Take 1 capsule (20 mg total) by mouth daily. 90 capsule 2  . ondansetron (ZOFRAN) 4 MG tablet Take 1 tablet (4 mg total) by mouth every 8 (eight) hours as needed for nausea or vomiting. 20 tablet 0  . oxyCODONE-acetaminophen (PERCOCET/ROXICET) 5-325 MG tablet Take 1-2 tablets by mouth every 4 (four) hours as needed for moderate pain or severe pain. 60 tablet 0  . ramipril (ALTACE) 10 MG capsule Take 1 capsule (10 mg total) by mouth every evening. 90 capsule 2  .  RELION INSULIN SYRINGE 1ML/31G 31G X 5/16" 1 ML MISC Use as directed 100 each 1  . rOPINIRole (REQUIP) 1 MG tablet TAKE ONE TABLET BY MOUTH AT BEDTIME. (Patient taking differently: Take 1 mg by mouth at bedtime. ) 90 tablet 1  . Turmeric 500 MG TABS Take 500 mg by mouth 2 (two) times daily.     . verapamil (CALAN) 120 MG tablet Take 120 mg by mouth daily as needed (afib).     . verapamil (VERELAN PM) 240 MG 24 hr capsule Take 1 capsule (240 mg total) by mouth daily. 90 capsule 3  . VITAMIN D PO Take 10,000 Units by mouth daily.      No current facility-administered medications on file prior to visit.     Past Medical History:  Diagnosis Date  . Anemia    during 1st pregnancy  . Anxiety   . Arthritis   . Cancer ([place])    Basal - face  . Complication of  anesthesia   . Depression   . Diarrhea, functional   . Dysrhythmia    Paroysmal Atrial Tachycardia  . Essential hypertension   . Fibromyalgia   . GERD (gastroesophageal reflux disease)   . H/O acute pancreatitis   . Headache   . History of hiatal hernia   . Hyperlipemia   . Neuropathy   . Osteoporosis   . Panic attacks   . Paralysis ([place]) 01/06/2019   hands  . PAT (paroxysmal atrial tachycardia) ([place])   . Pneumonia   . PONV (postoperative nausea and vomiting)   . PSVT (paroxysmal supraventricular tachycardia) ([place])   . Reflux   . Restless leg syndrome   . Spinal cord compression ([place])   . Type 2 diabetes mellitus ([facility])    Type II  . Vertigo    not current   Allergies  Allergen Reactions  . Amoxil [Amoxicillin] Swelling and Other (See Comments)    Lips Has patient had a PCN reaction causing immediate rash, facial/tongue/throat swelling, SOB or lightheadedness with hypotension:YES Has patient had a PCN reaction causing severe rash involving mucus membranes or skin necrosis:No Has patient had a PCN reaction that required hospitalization:No Has patient had a PCN reaction occurring within the last 10 years:Yes. If all  of the above answers are "NO", then may proceed with Cephalosporin use.   Marland Kitchen Lisinopril Itching and Swelling    SWELLING REACTION UNSPECIFIED   . Chocolate Hives  . Demerol Nausea And Vomiting  . Other Other (See Comments)    Raw food or nuts - GI pain    Social History   Socioeconomic History  . Marital status: Widowed    Spouse name: Glendell Docker  . Number of children: 2  . Years of education: Not on file  . Highest education level: Not on file  Occupational History  . Not on file  Tobacco Use  . Smoking status: Former Research scientist (life sciences)  . Smokeless tobacco: Never Used  . Tobacco comment: smoke 1 cigarette every few weeks when the [place] lady came by.  Substance and Sexual Activity  . Alcohol use: No  . Drug use: No  . Sexual activity: Not on file  Other Topics Concern  . Not on file  Social History Narrative   Lives home alone.  Daughter [name] with her today.   Independent of ADLs.  Education HS.  Widowed.     Social Determinants of Health   Financial Resource Strain:   . Difficulty of Paying Living Expenses: Not on file  Food Insecurity:   . Worried About Charity fundraiser in the Last Year: Not on file  . Ran Out of Food in the Last Year: Not on file  Transportation Needs:   . Lack of Transportation (Medical): Not on file  . Lack of Transportation (Non-Medical): Not on file  Physical Activity:   . Days of Exercise per Week: Not on file  . Minutes of Exercise per Session: Not on file  Stress:   . Feeling of Stress : Not on file  Social Connections:   . Frequency of Communication with Friends and Family: Not on file  . Frequency of Social Gatherings with Friends and Family: Not on file  . Attends Religious Services: Not on file  . Active Member of Clubs or Organizations: Not on file  . Attends Archivist Meetings: Not on file  . Marital Status: Not on file    Vitals:   01/27/19 1005  BP: 128/70  Pulse: (!) 56  Resp: 16  SpO2: 98%   Body mass index is 34.33  kg/m.   Physical Exam  Nursing note and vitals reviewed. Constitutional: She is oriented to person, place, and time. She appears well-developed. No distress.  HENT:  Head: Normocephalic and atraumatic.  Mouth/Throat: Oropharynx is clear and moist and mucous membranes are normal.  Eyes: Pupils are equal, round, and reactive to light. Conjunctivae are normal.  Cardiovascular: Normal rate and regular rhythm.  No murmur heard. Pulses:      Dorsalis pedis pulses are 2+ on the right side and 2+ on the left side.  HR by my count 64/min  Respiratory: Effort normal and breath sounds normal. No respiratory distress.  GI: Soft. She exhibits no mass. There is no abdominal tenderness.  Musculoskeletal:        General: No edema.  Lymphadenopathy:    She has no cervical adenopathy.  Neurological: She is alert and oriented to person, place, and time. No cranial nerve deficit.  In a wheel chair.  Skin: Skin is warm. Rash noted. Rash is macular.  Lower thoracic mid back with linear,vertical wound . It is surrounded by erythema.Open with granulation tissue.  Macular erythematous rash in groin,bilatera. No tenderness and no induration.  Psychiatric: She has a normal mood and affect.  Well groomed, good eye contact.     ASSESSMENT AND PLAN:   Ms. MAJORIE SANTEE was seen today for follow-up.  Diagnoses and all orders for this visit:  Essential hypertension, benign BP adequately controlled. No changes in current management. Monitor BP at home. Low sal diet.  Wound dehiscence She is planning on seeing surgeon tomorrow. Continue monitoring for signs of infection. Keep wound clear with soap and water.  Fibromyalgia Well controlled with Cymbalta 30 mg. No current management.  Urinary frequency No improvement with abx, she is on Cipro. We need to consider other etiologies. Side effects of abx discussed. ? Overactive bladder. Side effects of Cipro discussed as well as recommendations  oif regard to UTI. We wil follow U c x.  -     Urine culture -     mirabegron ER (MYRBETRIQ) 25 MG TB24 tablet; Take 1 tablet (25 mg total) by mouth daily.  Intertrigo Educated about Dx. Applied Desitin at bedtime. If possible for not wear diaper at bedtime. Nystatin powder tid prn. Monitor for signs of infection   Thoracic myelopathy S/P thoracic decompression.  Continue PT and OT. Fall precautions discussed. Continue following with neurosurgeon.   Visit time:10:34 am-11:20 am. 40 min face to face OV. > 50% was dedicated to discussion of Dx's, prognosis, treatment options, and some side effects of medications.  Return in about 4 months (around 05/28/2019).     Mehar Kirkwood G. [name], MD  [facility]. [place] office.

## 2019-01-28 ENCOUNTER — Encounter: Payer: Self-pay | Admitting: Family Medicine

## 2019-01-28 LAB — URINE CULTURE
MICRO NUMBER:: 1195464
SPECIMEN QUALITY:: ADEQUATE

## 2019-01-29 ENCOUNTER — Encounter: Payer: Self-pay | Admitting: Family Medicine

## 2019-01-29 DIAGNOSIS — M4804 Spinal stenosis, thoracic region: Secondary | ICD-10-CM | POA: Diagnosis not present

## 2019-01-29 DIAGNOSIS — E1149 Type 2 diabetes mellitus with other diabetic neurological complication: Secondary | ICD-10-CM | POA: Diagnosis not present

## 2019-01-30 ENCOUNTER — Encounter: Payer: Self-pay | Admitting: Family Medicine

## 2019-01-30 DIAGNOSIS — F419 Anxiety disorder, unspecified: Secondary | ICD-10-CM | POA: Diagnosis not present

## 2019-01-30 DIAGNOSIS — S14109D Unspecified injury at unspecified level of cervical spinal cord, subsequent encounter: Secondary | ICD-10-CM | POA: Diagnosis not present

## 2019-01-30 DIAGNOSIS — Z7982 Long term (current) use of aspirin: Secondary | ICD-10-CM | POA: Diagnosis not present

## 2019-01-30 DIAGNOSIS — F329 Major depressive disorder, single episode, unspecified: Secondary | ICD-10-CM | POA: Diagnosis not present

## 2019-01-30 DIAGNOSIS — M797 Fibromyalgia: Secondary | ICD-10-CM | POA: Diagnosis not present

## 2019-01-30 DIAGNOSIS — Z794 Long term (current) use of insulin: Secondary | ICD-10-CM | POA: Diagnosis not present

## 2019-01-30 DIAGNOSIS — M519 Unspecified thoracic, thoracolumbar and lumbosacral intervertebral disc disorder: Secondary | ICD-10-CM | POA: Diagnosis not present

## 2019-01-30 DIAGNOSIS — E119 Type 2 diabetes mellitus without complications: Secondary | ICD-10-CM | POA: Diagnosis not present

## 2019-01-30 DIAGNOSIS — Z9181 History of falling: Secondary | ICD-10-CM | POA: Diagnosis not present

## 2019-01-30 DIAGNOSIS — Z79891 Long term (current) use of opiate analgesic: Secondary | ICD-10-CM | POA: Diagnosis not present

## 2019-01-30 DIAGNOSIS — Z96652 Presence of left artificial knee joint: Secondary | ICD-10-CM | POA: Diagnosis not present

## 2019-01-30 DIAGNOSIS — Z791 Long term (current) use of non-steroidal anti-inflammatories (NSAID): Secondary | ICD-10-CM | POA: Diagnosis not present

## 2019-01-30 DIAGNOSIS — G8250 Quadriplegia, unspecified: Secondary | ICD-10-CM | POA: Diagnosis not present

## 2019-01-30 DIAGNOSIS — Z8701 Personal history of pneumonia (recurrent): Secondary | ICD-10-CM | POA: Diagnosis not present

## 2019-01-30 DIAGNOSIS — I10 Essential (primary) hypertension: Secondary | ICD-10-CM | POA: Diagnosis not present

## 2019-01-30 DIAGNOSIS — Z4789 Encounter for other orthopedic aftercare: Secondary | ICD-10-CM | POA: Diagnosis not present

## 2019-01-30 DIAGNOSIS — G959 Disease of spinal cord, unspecified: Secondary | ICD-10-CM | POA: Diagnosis not present

## 2019-01-30 DIAGNOSIS — Z981 Arthrodesis status: Secondary | ICD-10-CM | POA: Diagnosis not present

## 2019-02-04 ENCOUNTER — Encounter: Payer: PPO | Admitting: Physical Medicine & Rehabilitation

## 2019-02-20 NOTE — Telephone Encounter (Signed)
Disregard

## 2019-02-21 ENCOUNTER — Other Ambulatory Visit: Payer: Self-pay | Admitting: Physician Assistant

## 2019-02-21 MED ORDER — CADEXOMER IODINE 0.9 % EX GEL: 1.0000 "application " | Freq: Every day | CUTANEOUS | 3 refills | Status: DC | PRN

## 2019-02-22 ENCOUNTER — Other Ambulatory Visit: Payer: Self-pay | Admitting: Family Medicine

## 2019-02-24 DIAGNOSIS — M4804 Spinal stenosis, thoracic region: Secondary | ICD-10-CM | POA: Diagnosis not present

## 2019-02-28 ENCOUNTER — Telehealth (INDEPENDENT_AMBULATORY_CARE_PROVIDER_SITE_OTHER): Payer: PPO | Admitting: Family Medicine

## 2019-02-28 ENCOUNTER — Encounter: Payer: Self-pay | Admitting: Family Medicine

## 2019-02-28 VITALS — Ht 64.0 in

## 2019-02-28 DIAGNOSIS — M4714 Other spondylosis with myelopathy, thoracic region: Secondary | ICD-10-CM

## 2019-02-28 DIAGNOSIS — T8131XD Disruption of external operation (surgical) wound, not elsewhere classified, subsequent encounter: Secondary | ICD-10-CM | POA: Diagnosis not present

## 2019-02-28 DIAGNOSIS — G629 Polyneuropathy, unspecified: Secondary | ICD-10-CM | POA: Diagnosis not present

## 2019-02-28 NOTE — Progress Notes (Signed)
Virtual Visit via Video Note   I connected with [name] and her daughter on 02/28/19 by a video enabled telemedicine application and verified that I am speaking with the correct person using two identifiers.  Location patient: home Location provider:work office Persons participating in the virtual visit: patient, provider  I discussed the limitations of evaluation and management by telemedicine and the availability of in person appointments. The patient expressed understanding and agreed to proceed.   HPI: Leah. [name] is a 79 yo female with Hx of HTN,depression,fibromyalgia,DM II, and generalized OA among some c/o worsen left foot pain,numbness,and burning sensation 3 days ago. Feet felt "on fire." Severe constant pain, exacerbated by standing or walking. Problem started a day after PT, 45 minutes. Symptoms have improved, back to her baseline.  She is also reporting LLE electricity like pain while doing PT.  She is concerned about these symptoms be caused by complications related to poor healing of thoracic wound.  S/P thoracic surgery, 01/08/19 underwent thoracic decompression/fusion.Would is open and healing very slow.  Dr [name] referred her for wound care,appt in 2 weeks.  She completed antibiotic treatment, ciprofloxacin. Her daughter noticed some necrotic tissue. She works with Dr. Sharol Given, who recommended enzymatic debridement, has been using daily x 7 days.Her daughter is reporting improvement.  Negative for chills, fever, changes in appetite, abdominal pain, nausea, vomiting, changes in bowel habits, or urinary symptoms.  ROS: See pertinent positives and negatives per HPI.  Past Medical History:  Diagnosis Date  . Anemia    during 1st pregnancy  . Anxiety   . Arthritis   . Cancer ([place])    Basal - face  . Complication of anesthesia   . Depression   . Diarrhea, functional   . Dysrhythmia    Paroysmal Atrial Tachycardia  . Essential hypertension   . Fibromyalgia   .  GERD (gastroesophageal reflux disease)   . H/O acute pancreatitis   . Headache   . History of hiatal hernia   . Hyperlipemia   . Neuropathy   . Osteoporosis   . Panic attacks   . Paralysis ([place]) 01/06/2019   hands  . PAT (paroxysmal atrial tachycardia) ([place])   . Pneumonia   . PONV (postoperative nausea and vomiting)   . PSVT (paroxysmal supraventricular tachycardia) ([place])   . Reflux   . Restless leg syndrome   . Spinal cord compression ([place])   . Type 2 diabetes mellitus ([facility])    Type II  . Vertigo    not current    Past Surgical History:  Procedure Laterality Date  . ABDOMINAL HYSTERECTOMY    . ANKLE SURGERY Left    x 2  . ANTERIOR CERVICAL DECOMP/DISCECTOMY FUSION N/A 11/12/2017   Procedure: Cervical seven to Thoracic one  Anterior cervical decompression/discectomy/fusion with removal of old plate;  Surgeon: [name], MD;  Location: [facility];  Service: Neurosurgery;  Laterality: N/A;  . BACK SURGERY     T12-L1 fusion, Anterior Cervical fusion  . CARDIAC CATHETERIZATION N/A 02/16/2015   Procedure: Left Heart Cath and Coronary Angiography;  Surgeon: Sherren Mocha, MD;  Location: [facility];  Service: Cardiovascular;  Laterality: N/A;  . CHOLECYSTECTOMY    . COLONOSCOPY    . ESOPHAGOGASTRODUODENOSCOPY  12/21/2010   Procedure: ESOPHAGOGASTRODUODENOSCOPY (EGD);  Surgeon: Landry Dyke, MD;  Location: [facility];  Service: Endoscopy;  Laterality: N/A;  . EXCISION MORTON'S NEUROMA Right   . EYE SURGERY Bilateral    Cataract  . FRACTURE SURGERY  ankle x 2   . KNEE SURGERY Right    x 3- arthroscopy  . NECK SURGERY    . POSTERIOR CERVICAL FUSION/FORAMINOTOMY N/A 01/22/2018   Procedure: Cervical six-seven Posterior Decompression with Posterior Fixation Cervical Five to Thoracic Two;  Surgeon: [name], MD;  Location: [facility];  Service: Neurosurgery;  Laterality: N/A;  Cervical 6-7 Posteror decompression with posterior fixation Cervical 5 to Thoracic 2  .  ROTATOR CUFF REPAIR Right   . TONSILLECTOMY    . TOTAL KNEE ARTHROPLASTY Left 04/10/2017   Procedure: LEFT TOTAL KNEE ARTHROPLASTY;  Surgeon: Paralee Cancel, MD;  Location: [facility];  Service: Orthopedics;  Laterality: Left;  70 mins  . UPPER GASTROINTESTINAL ENDOSCOPY    . WRIST SURGERY Left    fracture    Family History  Problem Relation Age of Onset  . Heart failure Mother   . Hyperlipidemia Mother   . Lung cancer Father        Lymphoma  . Hyperlipidemia Maternal Grandmother   . Peripheral vascular disease Maternal Grandmother     Social History   Socioeconomic History  . Marital status: Widowed    Spouse name: Glendell Docker  . Number of children: 2  . Years of education: Not on file  . Highest education level: Not on file  Occupational History  . Not on file  Tobacco Use  . Smoking status: Former Research scientist (life sciences)  . Smokeless tobacco: Never Used  . Tobacco comment: smoke 1 cigarette every few weeks when the [place] lady came by.  Substance and Sexual Activity  . Alcohol use: No  . Drug use: No  . Sexual activity: Not on file  Other Topics Concern  . Not on file  Social History Narrative   Lives home alone.  Daughter [name] with her today.   Independent of ADLs.  Education HS.  Widowed.     Social Determinants of Health   Financial Resource Strain:   . Difficulty of Paying Living Expenses: Not on file  Food Insecurity:   . Worried About Charity fundraiser in the Last Year: Not on file  . Ran Out of Food in the Last Year: Not on file  Transportation Needs:   . Lack of Transportation (Medical): Not on file  . Lack of Transportation (Non-Medical): Not on file  Physical Activity:   . Days of Exercise per Week: Not on file  . Minutes of Exercise per Session: Not on file  Stress:   . Feeling of Stress : Not on file  Social Connections:   . Frequency of Communication with Friends and Family: Not on file  . Frequency of Social Gatherings with Friends and Family: Not on file  . Attends  Religious Services: Not on file  . Active Member of Clubs or Organizations: Not on file  . Attends Archivist Meetings: Not on file  . Marital Status: Not on file  Intimate Partner Violence:   . Fear of Current or Ex-Partner: Not on file  . Emotionally Abused: Not on file  . Physically Abused: Not on file  . Sexually Abused: Not on file    Current Outpatient Medications:  .  aspirin EC 81 MG tablet, Take 81 mg by mouth at bedtime. , Disp: , Rfl:  .  BD PEN NEEDLE MICRO U/F 32G X 6 MM MISC, 4 TIMES A DAY NOVOLOG(3) AND LANTUS9(1), Disp: 100 each, Rfl: 6 .  cadexomer iodine (IODOSORB) 0.9 % gel, Apply 1 application topically daily as needed for  wound care. Apply to the affected area daily plus dry dressing, Disp: 40 g, Rfl: 3 .  Calcium Carb-Cholecalciferol (CALCIUM 600+D3) 600-200 MG-UNIT TABS, Take 1 tablet by mouth at bedtime. , Disp: , Rfl:  .  ciprofloxacin (CIPRO) 250 MG tablet, Take 1 tablet (250 mg total) by mouth 2 (two) times daily., Disp: 20 tablet, Rfl: 0 .  Continuous Blood Gluc Receiver (DEXCOM G6 RECEIVER) DEVI, 1 Device by Does not apply route daily., Disp: 1 Device, Rfl: 0 .  Continuous Blood Gluc Sensor (DEXCOM G6 SENSOR) MISC, 1 kit by Does not apply route as directed., Disp: 3 each, Rfl: 3 .  Continuous Blood Gluc Transmit (DEXCOM G6 TRANSMITTER) MISC, 1 kit by Does not apply route as directed., Disp: 1 each, Rfl: 3 .  Cranberry 450 MG CAPS, Take 900 mg by mouth 2 (two) times daily. , Disp: , Rfl:  .  digoxin (LANOXIN) 0.125 MG tablet, Take 1 tablet (0.125 mg total) by mouth daily. Patient needs appointment for further refills, Disp: 90 tablet, Rfl: 3 .  docusate sodium (COLACE) 100 MG capsule, Take 100 mg by mouth daily., Disp: , Rfl:  .  DULoxetine (CYMBALTA) 30 MG capsule, Take 1 capsule (30 mg total) by mouth daily. (Patient taking differently: Take 30 mg by mouth at bedtime. ), Disp: 90 capsule, Rfl: 2 .  ezetimibe (ZETIA) 10 MG tablet, TAKE 1 TABLET BY MOUTH  EVERY DAY, Disp: 90 tablet, Rfl: 1 .  glucose blood test strip, Use as instructed to test blood sugars 3 times daily [id number], Disp: 100 each, Rfl: 12 .  insulin aspart (NOVOLOG FLEXPEN) 100 UNIT/ML FlexPen, Inject 10 Units into the skin daily with breakfast AND 14 Units daily with lunch AND 14 Units daily with supper. (Patient taking differently: Inject 8 Units into the skin daily with breakfast, 10 Units daily with lunch and 10 Units daily with supper.), Disp: 15 mL, Rfl: 11 .  insulin glargine (LANTUS) 100 UNIT/ML injection, Inject 20 Units into the skin daily. , Disp: , Rfl:  .  meloxicam (MOBIC) 15 MG tablet, Take 15 mg by mouth daily. , Disp: , Rfl:  .  metoprolol succinate (TOPROL-XL) 25 MG 24 hr tablet, Take 1 tablet (25 mg total) by mouth daily. Patient needs to schedule an appointment for an additional refill, Disp: 90 tablet, Rfl: 3 .  mirabegron ER (MYRBETRIQ) 25 MG TB24 tablet, Take 1 tablet (25 mg total) by mouth daily., Disp: 30 tablet, Rfl: 2 .  Multiple Vitamins-Minerals (PRESERVISION AREDS 2 PO), Take 1 tablet by mouth 2 (two) times daily. , Disp: , Rfl:  .  nystatin (MYCOSTATIN/NYSTOP) powder, Apply topically 3 (three) times daily as needed., Disp: 15 g, Rfl: 6 .  omeprazole (PRILOSEC) 20 MG capsule, Take 1 capsule (20 mg total) by mouth daily., Disp: 90 capsule, Rfl: 2 .  oxyCODONE-acetaminophen (PERCOCET/ROXICET) 5-325 MG tablet, Take 1-2 tablets by mouth every 4 (four) hours as needed for moderate pain or severe pain., Disp: 60 tablet, Rfl: 0 .  ramipril (ALTACE) 10 MG capsule, Take 1 capsule (10 mg total) by mouth every evening., Disp: 90 capsule, Rfl: 2 .  RELION INSULIN SYRINGE 1ML/31G 31G X 5/16" 1 ML MISC, Use as directed, Disp: 100 each, Rfl: 1 .  rOPINIRole (REQUIP) 1 MG tablet, TAKE ONE TABLET BY MOUTH AT BEDTIME. (Patient taking differently: Take 1 mg by mouth at bedtime. ), Disp: 90 tablet, Rfl: 1 .  verapamil (CALAN) 120 MG tablet, Take 120 mg by mouth daily as  needed  (afib). , Disp: , Rfl:  .  verapamil (VERELAN PM) 240 MG 24 hr capsule, Take 1 capsule (240 mg total) by mouth daily., Disp: 90 capsule, Rfl: 3 .  VITAMIN D PO, Take 10,000 Units by mouth daily. , Disp: , Rfl:   EXAM:  VITALS per patient if applicable:Ht 5' 4"  (1.626 m)   BMI 34.33 kg/m   GENERAL: alert, oriented, appears well and in no acute distress  HEENT: atraumatic, normocephalic, conjunctiva clear, no obvious abnormalities.  LUNGS: on inspection no signs of respiratory distress, breathing rate appears normal, no obvious gross SOB, gasping or wheezing  CV: no obvious cyanosis  Leah: moves all visible extremities without noticeable abnormality  SKIN: Thoracic wound with a light crusty area and mild surrounding erythema. No drainage appreciated, 1/4 in (per daughter measurement)  PSYCH/NEURO: pleasant and cooperative, no obvious depression or anxiety, speech and thought processing grossly intact  ASSESSMENT AND PLAN:  Discussed the following assessment and plan:  Dehiscence of operative wound, subsequent encounter No signs of infection and healing , although slowly. Explained that localization of wound and diabetes hx may contribute to slow healing. Recommend keeping wound clean with soap and water, continue enzymatic debridement as instructed,and keep appt with wound clinic.  Thoracic myelopathy LLE "electrivity" like pain could be related to lumbar radiculopathy. Fall precautions. Continue following with Dr [name].  Peripheral polyneuropathy Feet burning she described is more typical of peripheral neuropathy related with DM II. We discussed treatment options. She is already on Cymbalta, which does could be increased. Gabapentin and Lyrica are other options. Because she is back to her baseline,she [prefers to hold on medication changes for now. Appropriate foot care and injury prevention.   I discussed the assessment and treatment plan with the patient. [name] and  her daughter were provided an opportunity to ask questions and all were answered. She agreed with the plan and demonstrated an understanding of the instructions.    Return if symptoms worsen or fail to improve.    Danyal Whitenack [name], MD

## 2019-03-03 DIAGNOSIS — E1149 Type 2 diabetes mellitus with other diabetic neurological complication: Secondary | ICD-10-CM | POA: Diagnosis not present

## 2019-03-04 DIAGNOSIS — M4804 Spinal stenosis, thoracic region: Secondary | ICD-10-CM | POA: Diagnosis not present

## 2019-03-06 ENCOUNTER — Encounter (HOSPITAL_BASED_OUTPATIENT_CLINIC_OR_DEPARTMENT_OTHER): Payer: PPO | Attending: Internal Medicine | Admitting: Internal Medicine

## 2019-03-06 ENCOUNTER — Other Ambulatory Visit: Payer: Self-pay

## 2019-03-06 DIAGNOSIS — E11622 Type 2 diabetes mellitus with other skin ulcer: Secondary | ICD-10-CM | POA: Insufficient documentation

## 2019-03-06 DIAGNOSIS — Z823 Family history of stroke: Secondary | ICD-10-CM | POA: Insufficient documentation

## 2019-03-06 DIAGNOSIS — L98422 Non-pressure chronic ulcer of back with fat layer exposed: Secondary | ICD-10-CM | POA: Insufficient documentation

## 2019-03-06 DIAGNOSIS — I10 Essential (primary) hypertension: Secondary | ICD-10-CM | POA: Diagnosis not present

## 2019-03-06 DIAGNOSIS — Z885 Allergy status to narcotic agent status: Secondary | ICD-10-CM | POA: Insufficient documentation

## 2019-03-06 DIAGNOSIS — K219 Gastro-esophageal reflux disease without esophagitis: Secondary | ICD-10-CM | POA: Insufficient documentation

## 2019-03-06 DIAGNOSIS — R42 Dizziness and giddiness: Secondary | ICD-10-CM | POA: Insufficient documentation

## 2019-03-06 DIAGNOSIS — Z880 Allergy status to penicillin: Secondary | ICD-10-CM | POA: Insufficient documentation

## 2019-03-06 DIAGNOSIS — E1142 Type 2 diabetes mellitus with diabetic polyneuropathy: Secondary | ICD-10-CM | POA: Diagnosis not present

## 2019-03-06 DIAGNOSIS — I252 Old myocardial infarction: Secondary | ICD-10-CM | POA: Diagnosis not present

## 2019-03-06 DIAGNOSIS — E1151 Type 2 diabetes mellitus with diabetic peripheral angiopathy without gangrene: Secondary | ICD-10-CM | POA: Insufficient documentation

## 2019-03-06 DIAGNOSIS — T8131XD Disruption of external operation (surgical) wound, not elsewhere classified, subsequent encounter: Secondary | ICD-10-CM | POA: Diagnosis not present

## 2019-03-06 DIAGNOSIS — X58XXXD Exposure to other specified factors, subsequent encounter: Secondary | ICD-10-CM | POA: Insufficient documentation

## 2019-03-06 DIAGNOSIS — T8131XA Disruption of external operation (surgical) wound, not elsewhere classified, initial encounter: Secondary | ICD-10-CM | POA: Diagnosis not present

## 2019-03-06 DIAGNOSIS — I471 Supraventricular tachycardia: Secondary | ICD-10-CM | POA: Insufficient documentation

## 2019-03-06 DIAGNOSIS — K859 Acute pancreatitis without necrosis or infection, unspecified: Secondary | ICD-10-CM | POA: Insufficient documentation

## 2019-03-06 DIAGNOSIS — Z809 Family history of malignant neoplasm, unspecified: Secondary | ICD-10-CM | POA: Diagnosis not present

## 2019-03-06 DIAGNOSIS — M797 Fibromyalgia: Secondary | ICD-10-CM | POA: Diagnosis not present

## 2019-03-06 DIAGNOSIS — Z8249 Family history of ischemic heart disease and other diseases of the circulatory system: Secondary | ICD-10-CM | POA: Insufficient documentation

## 2019-03-06 DIAGNOSIS — Z888 Allergy status to other drugs, medicaments and biological substances status: Secondary | ICD-10-CM | POA: Diagnosis not present

## 2019-03-06 NOTE — Progress Notes (Signed)
DAZHANE, LANCON ([id number]) Visit Report for 03/06/2019 Abuse/Suicide Risk Screen Details Patient Name: Date of Service: [name], [name] 03/06/2019 10:30 AM Medical Record [id number] Patient Account Number: [id number] Date of Birth/Sex: Treating RN: Mar 04, 1940 (79 y.o. Female) Kela Millin Primary Care Ibrohim Simmers: [name], Betty Other Clinician: Referring Tyja Gortney: Treating Jalayiah Bibian/Extender:Robson, Michael [name], Cala Bradford in Treatment: 0 Abuse/Suicide Risk Screen Items Answer ABUSE RISK SCREEN: Has anyone close to you tried to hurt or harm you recentlyo No Do you feel uncomfortable with anyone in your familyo No Has anyone forced you do things that you didnt want to doo No Electronic Signature(s) Signed: 03/06/2019 6:15:07 PM By: Kela Millin Entered By: Kela Millin on 03/06/2019 11:42:54 -------------------------------------------------------------------------------- Activities of Daily Living Details Patient Name: Date of Service: [name] 03/06/2019 10:30 AM Medical Record [id number] Patient Account Number: [id number] Date of Birth/Sex: Treating RN: [birth year] (79 y.o. Female) Kela Millin Primary Care Tyjae Issa: [name], Betty Other Clinician: Referring Mishell Donalson: Treating Daleen Steinhaus/Extender:Robson, Michael [name], Cala Bradford in Treatment: 0 Activities of Daily Living Items Answer Activities of Daily Living (Please select one for each item) Drive Automobile Not Able Take Medications Completely Able Use Telephone Completely Able Care for Appearance Need Assistance Use Toilet Need Assistance Bath / Shower Need Assistance Dress Self Need Assistance Feed Self Completely Able Walk Need Assistance Get In / Out Bed Need Assistance Housework Need Assistance Prepare Meals Need Assistance Handle Money Need Assistance Shop for Self Need Assistance Electronic Signature(s) Signed: 03/06/2019 6:15:07 PM By: Kela Millin Entered By:  Kela Millin on 03/06/2019 11:43:28 -------------------------------------------------------------------------------- Education Screening Details Patient Name: Date of Service: [name] 03/06/2019 10:30 AM Medical Record [id number] Patient Account Number: [id number] Date of Birth/Sex: Treating RN: [birth year] (78 y.o. Female) Kela Millin Primary Care Renika Shiflet: [name], Betty Other Clinician: Referring Tamera Pingley: Treating Hani Campusano/Extender:Robson, Michael [name], Cala Bradford in Treatment: 0 Primary Learner Assessed: Patient Learning Preferences/Education Level/Primary Language Learning Preference: Explanation Highest Education Level: College or Above Preferred Language: English Cognitive Barrier Language Barrier: No Translator Needed: No Memory Deficit: No Emotional Barrier: No Cultural/Religious Beliefs Affecting Medical Care: No Physical Barrier Impaired Vision: Yes Glasses Impaired Hearing: No Decreased Hand dexterity: No Knowledge/Comprehension Knowledge Level: High Comprehension Level: High Ability to understand written High instructions: Ability to understand verbal High instructions: Motivation Anxiety Level: Calm Cooperation: Cooperative Education Importance: Acknowledges Need Interest in Health Problems: Asks Questions Perception: Coherent Willingness to Engage in Self- High Management Activities: Readiness to Engage in Self- High Management Activities: Electronic Signature(s) Signed: 03/06/2019 6:15:07 PM By: Kela Millin Entered By: Kela Millin on 03/06/2019 11:44:02 -------------------------------------------------------------------------------- Fall Risk Assessment Details Patient Name: Date of Service: [name]. 03/06/2019 10:30 AM Medical Record [id number] Patient Account Number: [id number] Date of Birth/Sex: Treating RN: 1940-08-19 (79 y.o. Female) Kela Millin Primary Care Clell Trahan: [name],  Betty Other Clinician: Referring Londynn Sonoda: Treating Adrien Dietzman/Extender:Robson, Michael [name], Cala Bradford in Treatment: 0 Fall Risk Assessment Items Have you had 2 or more falls in the last 12 monthso 0 Yes Have you had any fall that resulted in injury in the last 12 monthso 0 No FALLS RISK SCREEN History of falling - immediate or within 3 months 0 No Secondary diagnosis (Do you have 2 or more medical diagnoseso) 15 Yes Ambulatory aid None/bed rest/wheelchair/nurse 0 No Crutches/cane/walker 15 Yes Furniture 0 No Intravenous therapy Access/Saline/Heparin Lock 0 No Weak (short steps with or without shuffle, stooped but able to lift head 10 Yes while walking, may seek support from furniture) Impaired (short steps with shuffle, may have difficulty arising  from chair, 0 No head down, impaired balance) Mental Status Oriented to own ability 0 Yes Overestimates or forgets limitations 0 No Risk Level: Medium Risk Score: 40 Electronic Signature(s) Signed: 03/06/2019 6:15:07 PM By: Kela Millin Entered By: Kela Millin on 03/06/2019 11:44:30 -------------------------------------------------------------------------------- Foot Assessment Details Patient Name: Date of Service: [name]. 03/06/2019 10:30 AM Medical Record [id number] Patient Account Number: [id number] Date of Birth/Sex: Treating RN: July 16, 1940 (79 y.o. Female) Kela Millin Primary Care Alexis Mizuno: [name], Betty Other Clinician: Referring Devina Bezold: Treating Broderick Fonseca/Extender:Robson, Michael [name], Cala Bradford in Treatment: 0 Foot Assessment Items Site Locations + = Sensation present, - = Sensation absent, C = Callus, U = Ulcer R = Redness, W = Warmth, M = Maceration, PU = Pre-ulcerative lesion F = Fissure, S = Swelling, D = Dryness Assessment Right: Left: Other Deformity: No No Prior Foot Ulcer: No No Prior Amputation: No No Charcot Joint: No No Ambulatory Status: Ambulatory With  Help Assistance Device: Walker Gait: Administrator, arts) Signed: 03/06/2019 6:15:07 PM By: Kela Millin Entered By: Kela Millin on 03/06/2019 11:45:11 -------------------------------------------------------------------------------- Nutrition Risk Screening Details Patient Name: Date of ServiceSHERION, KECKLER 03/06/2019 10:30 AM Medical Record [id number] Patient Account Number: [id number] Date of Birth/Sex: Treating RN: Jun 29, 1940 (79 y.o. Female) Kela Millin Primary Care Liberti Appleton: [name], Betty Other Clinician: Referring Aija Scarfo: Treating Lamarr Feenstra/Extender:Robson, Michael [name], Cala Bradford in Treatment: 0 Height (in): 61 Weight (lbs): 200 Body Mass Index (BMI): 37.8 Nutrition Risk Screening Items Score Screening NUTRITION RISK SCREEN: I have an illness or condition that made me change the kind and/or 0 No amount of food I eat I eat fewer than two meals per day 0 No I eat few fruits and vegetables, or milk products 0 No I have three or more drinks of beer, liquor or wine almost every day 0 No I have tooth or mouth problems that make it hard for me to eat 0 No I don't always have enough money to buy the food I need 0 No I eat alone most of the time 0 No I take three or more different prescribed or over-the-counter drugs a day 1 Yes 0 No Without wanting to, I have lost or gained 10 pounds in the last six months I am not always physically able to shop, cook and/or feed myself 2 Yes Nutrition Protocols Good Risk Protocol Provide education on Moderate Risk Protocol 0 nutrition High Risk Proctocol Risk Level: Moderate Risk Score: 3 Electronic Signature(s) Signed: 03/06/2019 6:15:07 PM By: Kela Millin Entered By: Kela Millin on 03/06/2019 11:44:57

## 2019-03-06 NOTE — Progress Notes (Addendum)
[name], [name] ([id number]) Visit Report for 03/06/2019 Chief Complaint Document Details Patient Name: Date of Service: [name], [name] 03/06/2019 10:30 AM Medical Record [id number] Patient Account Number: [id number] Date of Birth/Sex: Treating RN: Apr 28, 1940 (79 y.o. Female) Primary Care Provider: [name], Betty Other Clinician: Referring Provider: Treating Provider/Extender:Regginald Pask [name], Cala Bradford in Treatment: 0 Information Obtained from: Patient Chief Complaint 03/06/2019; patient is here for review of the surgical wound on her lower thoracic back Electronic Signature(s) Signed: 03/06/2019 5:54:58 PM By: Linton Ham MD Entered By: Linton Ham on 03/06/2019 12:42:17 -------------------------------------------------------------------------------- Debridement Details Patient Name: Date of Service: Leah Robinsons. 03/06/2019 10:30 AM Medical Record [id number] Patient Account Number: [id number] Date of Birth/Sex: Aug 18, 1940 (78 y.o. Female) Treating RN: Primary Care Provider: [name], Betty Other Clinician: Referring Provider: Treating Provider/Extender:Maribelle Hopple [name], Cala Bradford in Treatment: 0 Debridement Performed for Wound #1 Distal,Medial Back Assessment: Performed By: Physician Ricard Dillon., MD Debridement Type: Debridement Level of Consciousness (Pre- Awake and Alert procedure): Pre-procedure Verification/Time Out Taken: Yes - 12:10 Start Time: 12:11 Pain Control: Lidocaine 4% Topical Solution Total Area Debrided (L x W): 2.5 (cm) x 0.6 (cm) = 1.5 (cm) Tissue and other material Viable, Non-Viable, Slough, Subcutaneous, Skin: Dermis , Fibrin/Exudate, Slough debrided: Level: Skin/Subcutaneous Tissue Debridement Description: Excisional Instrument: Curette Bleeding: Minimum Hemostasis Achieved: Pressure End Time: 12:14 Procedural Pain: 0 Post Procedural Pain: 0 Response to Treatment: Procedure was tolerated well Level  of Consciousness Awake and Alert (Post-procedure): Post Debridement Measurements of Total Wound Length: (cm) 2.5 Width: (cm) 0.6 Depth: (cm) 0.3 Volume: (cm) 0.353 Character of Wound/Ulcer Post Improved Debridement: Post Procedure Diagnosis Same as Pre-procedure Electronic Signature(s) Signed: 03/06/2019 5:54:58 PM By: Linton Ham MD Entered By: Linton Ham on 03/06/2019 12:40:55 -------------------------------------------------------------------------------- HPI Details Patient Name: Date of Service: Leah Robinsons. 03/06/2019 10:30 AM Medical Record [id number] Patient Account Number: [id number] Date of Birth/Sex: Treating RN: Dec 29, 1940 (79 y.o. Female) Primary Care Provider: [name], Betty Other Clinician: Referring Provider: Treating Provider/Extender:Bandy Honaker [name], Cala Bradford in Treatment: 0 History of Present Illness HPI Description: ADMISSION 03/06/2019 This is a patient with a history of severe thoracic myelopathy admitted to hospital from 01/08/2019 through 01/10/2019 for surgical decompression decompression with fusion at T10-T11. Surgery was done by Dr. [name]. According to her daughter who is a nurse who accompanied her the wound dehisced about 2 weeks later. Initially they were using Betadine but at the suggestion of Dr. Sharol Given who the daughter knows they switched to Iodosorb ointment and this really has done a nice job cleaning up the wound surface by review of pictures on the daughter's phone Past medical history type 2 diabetes with peripheral neuropathy, paroxysmal atrial tachycardia, history of pancreatitis, history of fibromyalgia, vertigo and generalized arthritis as well as severe degenerative disc disease at the C and T-spine levels as well as L-spine Electronic Signature(s) Signed: 03/06/2019 5:54:58 PM By: Linton Ham MD Entered By: Linton Ham on 03/06/2019  12:58:42 -------------------------------------------------------------------------------- Physical Exam Details Patient Name: Date of Service: [name], [name] 03/06/2019 10:30 AM Medical Record [id number] Patient Account Number: [id number] Date of Birth/Sex: Treating RN: 08-18-1940 (79 y.o. Female) Primary Care Provider: [name], Betty Other Clinician: Referring Provider: Treating Provider/Extender:Aneesha Holloran [name], Cala Bradford in Treatment: 0 Constitutional Patient is hypertensive.. Pulse regular and within target range for patient.Marland Kitchen Respirations regular, non-labored and within target range.. Temperature is normal and within the target range for the patient.Marland Kitchen Appears in no distress. Integumentary (Hair, Skin) No erythema no overt infection around the wound. Neurological Patient is  insensate around the wound. Notes Wound exam; the area in question is at the T10-T11 level. She has a major wound inferiorly and a very tiny area superiorly. These have no direct connection. There is no evidence of cellulitis around the wound no purulent drainage. Using a #3 curette I removed debris from the larger surface wound which was tightly adherent. This cleans up quite nicely to quite healthy looking granulation tissue Electronic Signature(s) Signed: 03/06/2019 5:54:58 PM By: Linton Ham MD Entered By: Linton Ham on 03/06/2019 13:01:13 -------------------------------------------------------------------------------- Physician Orders Details Patient Name: Date of Service: [name], [name] 03/06/2019 10:30 AM Medical Record [id number] Patient Account Number: [id number] Date of Birth/Sex: Treating RN: 27-Sep-1940 (79 y.o. Female) Deon Pilling Primary Care Provider: [name], Betty Other Clinician: Referring Provider: Treating Provider/Extender:Fonda Rochon [name], Cala Bradford in Treatment: 0 Verbal / Phone Orders: No Diagnosis Coding Follow-up Appointments Return  Appointment in 1 week. Dressing Change Frequency Change Dressing every other day. Skin Barriers/Peri-Wound Care Skin Prep Wound Cleansing May shower and wash wound with soap and water. - with dressing changes. Primary Wound Dressing Wound #1 Distal,Medial Back Silver Collagen - moisten hydrogel or KY jelly Wound #2 Distal,Midline Back Silver Collagen - moisten hydrogel or KY jelly Secondary Dressing Wound #1 Distal,Medial Back Foam Border - or bordered gauze. Wound #2 Distal,Midline Back Foam Border - or bordered gauze. Off-Loading Turn and reposition every 2 hours Patient Medications Allergies: Demerol, amoxicillin, chocolate flavor, lisinopril Notifications Medication Indication Start End lidocaine DOSE topical 5 % gel - gel topical applied prior to debridement by MD. Electronic Signature(s) Signed: 03/06/2019 5:54:58 PM By: Linton Ham MD Signed: 03/06/2019 6:20:01 PM By: Deon Pilling Entered By: Deon Pilling on 03/06/2019 12:16:54 -------------------------------------------------------------------------------- Prescription 03/06/2019 Patient Name: Leah Robinsons Provider: Linton Ham MD Date of Birth: 1940-07-16 NPI#: [id number] Sex: Female DEA#: [id number] Phone #: [phone] License #: [id number] Patient Address: [place] [address] [place], [place] [address] [place], [place] [address] [phone] Allergies Demerol Reaction: N/V amoxicillin Reaction: anaphylaxis chocolate flavor Reaction: hives lisinopril Reaction: swelling, anaphylaxis Medication Medication: Route: Strength: Form: lidocaine 5 % topical gel topical 5% gel Class: TOPICAL LOCAL ANESTHETICS Dose: Frequency / Time: Indication: gel topical applied prior to debridement by MD. Number of Refills: Number of Units: 0 Generic Substitution: Start Date: End Date: One Time Use: Substitution Permitted No Note to  Pharmacy: Signature(s): Date(s): Electronic Signature(s) Signed: 03/06/2019 5:54:58 PM By: Linton Ham MD Signed: 03/06/2019 6:20:01 PM By: Deon Pilling Entered By: Deon Pilling on 03/06/2019 12:16:54 --------------------------------------------------------------------------------  Problem List Details Patient Name: Date of Service: [name], [name] 03/06/2019 10:30 AM Medical Record [id number] Patient Account Number: [id number] Date of Birth/Sex: Treating RN: [birth year] (79 y.o. 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 03/06/2019 10:30 AM Medical Record [id number] Patient Account Number: [id number] Date of Birth/Sex: Treating RN: 11-Nov-1940 (79 y.o. Female) Primary Care Provider: [name], Betty Other Clinician: Referring Provider: Treating Provider/Extender:Vinie Charity [name], Cala Bradford in Treatment: 0 Subjective  Chief Complaint Information obtained from Patient 03/06/2019; patient is here for review of the surgical wound on her lower thoracic back History of Present Illness (HPI) ADMISSION 03/06/2019 This is a patient with a history of severe thoracic myelopathy admitted to hospital from 01/08/2019 through 01/10/2019 for surgical  decompression decompression with fusion at T10-T11. Surgery was done by Dr. [name]. According to her daughter who is a nurse who accompanied her the wound dehisced about 2 weeks later. Initially they were using Betadine but at the suggestion of Dr. Sharol Given who the daughter knows they switched to Iodosorb ointment and this really has done a nice job cleaning up the wound surface by review of pictures on the daughter's phone Past medical history type 2 diabetes with peripheral neuropathy, paroxysmal atrial tachycardia, history of pancreatitis, history of fibromyalgia, vertigo and generalized arthritis as well as severe degenerative disc disease at the C and T-spine levels as well as L-spine Patient History Information obtained from Patient. Allergies Demerol (Reaction: N/V), amoxicillin (Reaction: anaphylaxis), chocolate flavor (Reaction: hives), lisinopril (Reaction: swelling, anaphylaxis) Family History Cancer - Maternal Grandparents,Paternal Grandparents,Father, Heart Disease - Paternal Grandparents,Mother, Hypertension - Mother, Stroke - Mother, No family history of Diabetes, Hereditary Spherocytosis, Kidney Disease, Lung Disease, Seizures, Thyroid Problems, Tuberculosis. Social History Never smoker, Marital Status - Widowed, Alcohol Use - Never, Drug Use - No History, Caffeine Use - Never. Medical History Eyes Denies history of Cataracts, Glaucoma, Optic Neuritis Hematologic/Lymphatic Denies history of Anemia, Hemophilia, Human Immunodeficiency Virus, Lymphedema, Sickle Cell Disease Respiratory Denies history of Aspiration, Asthma, Chronic Obstructive Pulmonary Disease (COPD), Pneumothorax, Sleep Apnea, Tuberculosis Cardiovascular Patient has history of Arrhythmia - paroxysmal supraventricular tachycardia, Hypertension Denies history of Angina, Congestive Heart Failure, Coronary Artery Disease, Deep Vein Thrombosis, Hypotension, Myocardial Infarction, Peripheral Arterial Disease,  Peripheral Venous Disease, Phlebitis, Vasculitis Gastrointestinal Denies history of Cirrhosis , Colitis, Crohnoos, Hepatitis A, Hepatitis B, Hepatitis C Endocrine Patient has history of Type II Diabetes Genitourinary Denies history of End Stage Renal Disease Integumentary (Skin) Denies history of History of Burn Musculoskeletal Denies history of Gout, Rheumatoid Arthritis, Osteoarthritis, Osteomyelitis Neurologic Denies history of Dementia, Neuropathy, Quadriplegia, Paraplegia, Seizure Disorder Patient is treated with Insulin. Medical And Surgical History Notes Respiratory hx of pneumonia Gastrointestinal GERD Musculoskeletal osteoporosis fibromyalgia Oncologic basal-face Psychiatric depression Review of Systems (ROS) Constitutional Symptoms (General Health) Denies complaints or symptoms of Fatigue, Fever, Chills, Marked Weight Change. Eyes Complains or has symptoms of Glasses / Contacts - glasses. Denies complaints or symptoms of Vision Changes. Ear/Nose/Mouth/Throat Denies complaints or symptoms of Chronic sinus problems or rhinitis. Respiratory Denies complaints or symptoms of Chronic or frequent coughs, Shortness of Breath. Cardiovascular Denies complaints or symptoms of Chest pain. Gastrointestinal Denies complaints or symptoms of Frequent diarrhea, Nausea, Vomiting. Endocrine Denies complaints or symptoms of Heat/cold intolerance. Genitourinary Denies complaints or symptoms of Frequent urination. Integumentary (Skin) Complains or has symptoms of Wounds, back Neurologic Denies complaints or symptoms of Numbness/parasthesias. Psychiatric Denies complaints or symptoms of Claustrophobia, Suicidal. Objective Constitutional Patient is hypertensive.. Pulse regular and within target range for patient.Marland Kitchen Respirations regular, non-labored and within target range.. Temperature is normal and within the target range for the patient.Marland Kitchen Appears in no distress. Vitals Time  Taken: 11:20 AM, Height: 61 in, Source: Stated, Weight: 200 lbs, Source: Stated, BMI: 37.8, Temperature: 98.3 F, Pulse: 68 bpm, Respiratory Rate: 18 breaths/min, Blood Pressure: 153/60 mmHg, Capillary Blood Glucose: 129 mg/dl. General Notes: CBG of 129 Neurological Patient is insensate around the wound. General Notes: Wound exam; the area in question  is at the T10-T11 level. She has a major wound inferiorly and a very tiny area superiorly. These have no direct connection. There is no evidence of cellulitis around the wound no purulent drainage. Using a #3 curette I removed debris from the larger surface wound which was tightly adherent. This cleans up quite nicely to quite healthy looking granulation tissue Integumentary (Hair, Skin) No erythema no overt infection around the wound. Wound #1 status is Open. Original cause of wound was Surgical Injury. The wound is located on the Distal,Medial Back. The wound measures 2.5cm length x 0.6cm width x 0.3cm depth; 1.178cm^2 area and 0.353cm^3 volume. There is Fat Layer (Subcutaneous Tissue) Exposed exposed. There is no tunneling or undermining noted. There is a small amount of serosanguineous drainage noted. The wound margin is well defined and not attached to the wound base. There is medium (34-66%) pink granulation within the wound bed. There is a medium (34-66%) amount of necrotic tissue within the wound bed including Adherent Slough. Wound #2 status is Open. Original cause of wound was Surgical Injury. The wound is located on the [place] Back. The wound measures 0.2cm length x 0.2cm width x 0.1cm depth; 0.031cm^2 area and 0.003cm^3 volume. There is Fat Layer (Subcutaneous Tissue) Exposed exposed. There is no tunneling or undermining noted. There is a small amount of serosanguineous drainage noted. The wound margin is distinct with the outline attached to the wound base. There is small (1-33%) pink granulation within the wound bed. There  is a large (67-100%) amount of necrotic tissue within the wound bed including Adherent Slough. 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 There was a Excisional Skin/Subcutaneous Tissue Debridement with a total area of 1.5 sq cm performed by Ricard Dillon., MD. With the following instrument(s): Curette to remove Viable and Non-Viable tissue/material. Material removed includes Subcutaneous Tissue, Slough, Skin: Dermis, and Fibrin/Exudate after achieving pain control using Lidocaine 4% Topical Solution. A time out was conducted at 12:10, prior to the start of the procedure. A Minimum amount of bleeding was controlled with Pressure. The procedure was tolerated well with a pain level of 0 throughout and a pain level of 0 following the procedure. Post Debridement Measurements: 2.5cm length x 0.6cm width x 0.3cm depth; 0.353cm^3 volume. Character of Wound/Ulcer Post Debridement is improved. Post procedure Diagnosis Wound #1: Same as Pre-Procedure Plan Follow-up Appointments: Return Appointment in 1 week. Dressing Change Frequency: Change Dressing every other day. Skin Barriers/Peri-Wound Care: Skin Prep Wound Cleansing: May shower and wash wound with soap and water. - with dressing changes. Primary Wound Dressing: Wound #1 Distal,Medial Back: Silver Collagen - moisten hydrogel or KY jelly Wound #2 Distal,Midline Back: Silver Collagen - moisten hydrogel or KY jelly Secondary Dressing: Wound #1 Distal,Medial Back: Foam Border - or bordered gauze. Wound #2 Distal,Midline Back: Foam Border - or bordered gauze. Off-Loading: Turn and reposition every 2 hours The following medication(s) was prescribed: lidocaine topical 5 %  gel gel topical applied prior to debridement by MD. was prescribed at facility 1. The wound cleans up quite nicely with simple debridement. 2. There is no deep probing areas in the 2 areas do not connect 3. Moistened silver collagen with hydrogel or K-Y jelly border foam change every 2 4. There is not to be anything else worrisome about  this wound it does not have probing depth, the 2 areas do not connect there is no purulent drainage and no evidence of infection Electronic Signature(s) Signed: 03/06/2019 5:54:58 PM By: Linton Ham MD Entered By: Linton Ham on 03/06/2019 13:05:40 -------------------------------------------------------------------------------- Leah Details Patient Name: Date of Service: Leah Robinsons. 03/06/2019 10:30 AM Medical Record [id number] Patient Account Number: [id number] Date of Birth/Sex: Treating RN: Oct 22, 1940 (79 y.o. Female) [name] Primary Care Provider: [name], Betty Other Clinician: Referring Provider: Treating Provider/Extender:Jahel Wavra [name], Cala Bradford in Treatment: 0 Information Obtained From Patient Constitutional Symptoms (General Health) Complaints and Symptoms: Negative for: Fatigue; Fever; Chills; Marked Weight Change Eyes Complaints and Symptoms: Positive for: Glasses / Contacts - glasses Negative for: Vision Changes Medical History: Negative for: Cataracts; Glaucoma; Optic Neuritis Ear/Nose/Mouth/Throat Complaints and Symptoms: Negative for: Chronic sinus problems or rhinitis Respiratory Complaints and Symptoms: Negative for: Chronic or frequent coughs; Shortness of Breath Medical History: Negative for: Aspiration; Asthma; Chronic Obstructive Pulmonary Disease (COPD); Pneumothorax; Sleep Apnea; Tuberculosis Past Medical History Notes: hx of pneumonia Cardiovascular Complaints and Symptoms: Negative for: Chest pain Medical History: Positive for: Arrhythmia - paroxysmal supraventricular  tachycardia; Hypertension Negative for: Angina; Congestive Heart Failure; Coronary Artery Disease; Deep Vein Thrombosis; Hypotension; Myocardial Infarction; Peripheral Arterial Disease; Peripheral Venous Disease; Phlebitis; Vasculitis Gastrointestinal Complaints and Symptoms: Negative for: Frequent diarrhea; Nausea; Vomiting Medical History: Negative for: Cirrhosis ; Colitis; Crohns; Hepatitis A; Hepatitis B; Hepatitis C Past Medical History Notes: GERD Endocrine Complaints and Symptoms: Negative for: Heat/cold intolerance Medical History: Positive for: Type II Diabetes Time with diabetes: 15 years Treated with: Insulin Genitourinary Complaints and Symptoms: Negative for: Frequent urination Medical History: Negative for: End Stage Renal Disease Integumentary (Skin) Complaints and Symptoms: Positive for: Wounds Review of System Notes: back Medical History: Negative for: History of Burn Neurologic Complaints and Symptoms: Negative for: Numbness/parasthesias Medical History: Negative for: Dementia; Neuropathy; Quadriplegia; Paraplegia; Seizure Disorder Psychiatric Complaints and Symptoms: Negative for: Claustrophobia; Suicidal Medical History: Past Medical History Notes: depression Hematologic/Lymphatic Medical History: Negative for: Anemia; Hemophilia; Human Immunodeficiency Virus; Lymphedema; Sickle Cell Disease Immunological Musculoskeletal Medical History: Negative for: Gout; Rheumatoid Arthritis; Osteoarthritis; Osteomyelitis Past Medical History Notes: osteoporosis fibromyalgia Oncologic Medical History: Past Medical History Notes: basal-face Immunizations Pneumococcal Vaccine: Received Pneumococcal Vaccination: Yes Implantable Devices None Family and Social History Cancer: Yes - Maternal Grandparents,Paternal Grandparents,Father; Diabetes: No; Heart Disease: Yes - Paternal Grandparents,Mother; Hereditary Spherocytosis: No; Hypertension: Yes - Mother;  Kidney Disease: No; Lung Disease: No; Seizures: No; Stroke: Yes - Mother; Thyroid Problems: No; Tuberculosis: No; Never smoker; Marital Status - Widowed; Alcohol Use: Never; Drug Use: No History; Caffeine Use: Never; Financial Concerns: No; Food, Clothing or Shelter Needs: No; Support System Lacking: No; Transportation Concerns: No Electronic Signature(s) Signed: 03/06/2019 5:54:58 PM By: Linton Ham MD Signed: 03/06/2019 6:15:07 PM By: [name] Entered By: [name] on 03/06/2019 11:42:43 -------------------------------------------------------------------------------- SuperBill Details Patient Name: Date of Service: [name], [name] 03/06/2019 Medical Record [id number] Patient Account Number: [id number] Date of Birth/Sex: Treating RN: [birth year] (79 y.o. 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 Facility Procedures The patient participates with Medicare or their insurance follows the Medicare Facility Guidelines: CPT4 Description Modifier Quantity Code [id number] - WOUND CARE VISIT-LEV 3 EST PT 1 The patient participates with Medicare or their  insurance follows the Medicare Facility Guidelines: [id number] [place] TISSUE 20 SQ CM/< 1 ICD-10 Diagnosis Description T81.31XD Disruption of external operation (surgical) wound, not elsewhere  classified, subsequent encounter 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 Physician Procedures CPT4: Description Modifier Quantity Code [id number] - WC PHYS LEVEL 2 - NEW PT 25 1 ICD-10 Diagnosis Description T81.31XD Disruption of external operation (surgical)  wound, not elsewhere classified, subsequent encounter L98.422 Non-pressure chronic  ulcer of back with fat layer exposed CPT4: [id number] - WC PHYS SUBQ TISS 20 SQ CM 1 ICD-10 Diagnosis Description T81.31XD Disruption of external operation (surgical) wound, not elsewhere classified, subsequent encounter 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

## 2019-03-06 NOTE — Progress Notes (Addendum)
[name], [name] ([id number]) Visit Report for 03/06/2019 Allergy List Details Patient Name: Date of Service: [name], [name] 03/06/2019 10:30 AM Medical Record [id number] Patient Account Number: [id number] Date of Birth/Sex: Treating RN: [birth year] (79 y.o. Female) Kela Millin Primary Care Torey Regan: [name], Betty Other Clinician: Referring Parys Elenbaas: Treating Hadrian Yarbrough/Extender:Robson, Michael [name], Cala Bradford in Treatment: 0 Allergies Active Allergies Demerol Reaction: N/V amoxicillin Reaction: anaphylaxis chocolate flavor Reaction: hives lisinopril Reaction: swelling, anaphylaxis Allergy Notes Electronic Signature(s) Signed: 03/06/2019 6:15:07 PM By: Kela Millin Entered By: Kela Millin on 03/06/2019 11:34:05 -------------------------------------------------------------------------------- Arrival Information Details Patient Name: Date of Service: [name]. 03/06/2019 10:30 AM Medical Record [id number] Patient Account Number: [id number] Date of Birth/Sex: Treating RN: 1940-03-15 (79 y.o. Female) Kela Millin Primary Care Nathanel Tallman: [name], Betty Other Clinician: Referring Amorah Sebring: Treating Jezel Basto/Extender:Robson, Michael [name], Cala Bradford in Treatment: 0 Visit Information Patient Arrived: Gilford Rile Arrival Time: 11:20 Accompanied By: daughter Transfer Assistance: None Patient Identification Verified: Yes Secondary Verification Process Yes Completed: Patient Has Alerts: Yes Patient Alerts: patient takes aspirin Electronic Signature(s) Signed: 03/06/2019 6:15:07 PM By: Kela Millin Entered By: Kela Millin on 03/06/2019 11:22:32 -------------------------------------------------------------------------------- Clinic Level of Care Assessment Details Patient Name: Date of Service: Leah, [name] 03/06/2019 10:30 AM Medical Record [id number] Patient Account Number: [id number] Date of Birth/Sex: Treating  RN: [birth year] (79 y.o. Female) Deon Pilling Primary Care Mckaylie Vasey: [name], Betty Other Clinician: Referring Yona Kosek: Treating Kannan Proia/Extender:Robson, Michael [name], Cala Bradford in Treatment: 0 Clinic Level of Care Assessment Items TOOL 1 Quantity Score X - Use when EandM and Procedure is performed on INITIAL visit 1 0 ASSESSMENTS - Nursing Assessment / Reassessment X - General Physical Exam (combine w/ comprehensive assessment (listed just below) 1 20 when performed on new pt. evals) X - Comprehensive Assessment (HX, ROS, Risk Assessments, Wounds Hx, etc.) 1 25 ASSESSMENTS - Wound and Skin Assessment / Reassessment X - Dermatologic / Skin Assessment (not related to wound area) 1 10 ASSESSMENTS - Ostomy and/or Continence Assessment and Care []  - Incontinence Assessment and Management 0 []  - Ostomy Care Assessment and Management (repouching, etc.) 0 PROCESS - Coordination of Care []  - Simple Patient / Family Education for ongoing care 0 X - Complex (extensive) Patient / Family Education for ongoing care 1 20 X - Staff obtains Programmer, systems, Records, Test Results / Process Orders 1 10 []  - Staff telephones HHA, Nursing Homes / Clarify orders / etc 0 []  - Routine Transfer to another Facility (non-emergent condition) 0 []  - Routine Hospital Admission (non-emergent condition) 0 X - New Admissions / Biomedical engineer / Ordering NPWT, Apligraf, etc. 1 15 []  - Emergency Hospital Admission (emergent condition) 0 PROCESS - Special Needs []  - Pediatric / Minor Patient Management 0 []  - Isolation Patient Management 0 []  - Hearing / Language / Visual special needs 0 []  - Assessment of Community assistance (transportation, D/C planning, etc.) 0 []  - Additional assistance / Altered mentation 0 []  - Support Surface(s) Assessment (bed, cushion, seat, etc.) 0 INTERVENTIONS - Miscellaneous []  - External ear exam 0 []  - Patient Transfer (multiple staff / Civil Service fast streamer / Similar devices) 0 []  -  Simple Staple / Suture removal (25 or less) 0 []  - Complex Staple / Suture removal (26 or more) 0 []  - Hypo/Hyperglycemic Management (do not check if billed separately) 0 []  - Ankle / Brachial Index (ABI) - do not check if billed separately 0 Has the patient been seen at the hospital within the last three years: Yes Total Score: 100 Level Of Care: New/Established -  Level 3 Electronic Signature(s) Signed: 03/07/2019 5:44:09 PM By: Deon Pilling Entered By: Deon Pilling on 03/07/2019 11:21:29 -------------------------------------------------------------------------------- Lower Extremity Assessment Details Patient Name: Date of Service: Leah, [name] 03/06/2019 10:30 AM Medical Record [id number] Patient Account Number: [id number] Date of Birth/Sex: Treating RN: [birth year] (79 y.o. Female) Kela Millin Primary Care Cataleyah Colborn: [name], Betty Other Clinician: Referring Janalynn Eder: Treating Breccan Galant/Extender:Robson, Michael [name], Cala Bradford in Treatment: 0 Electronic Signature(s) Signed: 03/06/2019 6:15:07 PM By: Kela Millin Entered By: Kela Millin on 03/06/2019 11:45:18 -------------------------------------------------------------------------------- Multi Wound Chart Details Patient Name: Date of Service: Leah, [name] 03/06/2019 10:30 AM Medical Record [id number] Patient Account Number: [id number] Date of Birth/Sex: Treating RN: 08/07/1940 (79 y.o. Female) Primary Care Ashli Selders: [name], Betty Other Clinician: Referring Akosua Constantine: Treating Abbie Berling/Extender:Robson, Michael [name], Cala Bradford in Treatment: 0 Vital Signs Height(in): 61 Capillary Blood 129 Glucose(mg/dl): Weight(lbs): 200 Pulse(bpm): 61 Body Mass Index(BMI): 38 Blood Pressure(mmHg): 153/60 Temperature(F): 98.3 Respiratory 18 Rate(breaths/min): Photos: [1:No Photos] [2:No Photos] [N/A:N/A] Wound Location: [1:Back - Medial, Distal] [2:Back - Midline, Distal] [N/A:N/A] Wounding  Event: [1:Surgical Injury] [2:Surgical Injury] [N/A:N/A] Primary Etiology: [1:Open Surgical Wound] [2:Open Surgical Wound] [N/A:N/A] Date Acquired: [1:01/08/2019] [2:01/08/2019] [N/A:N/A] Weeks of Treatment: [1:0] [2:0] [N/A:N/A] Wound Status: [1:Open] [2:Open] [N/A:N/A] Measurements L x W x D [1:2.5x0.6x0.3] [2:0.2x0.2x0.1] [N/A:N/A] (cm) Area (cm) : [1:1.178] [2:0.031] [N/A:N/A] Volume (cm) : [1:0.353] [2:0.003] [N/A:N/A] Classification: [1:Full Thickness Without Exposed Support Structures Exposed Support Structures] [2:Full Thickness Without] [N/A:N/A] Exudate Amount: [1:Small] [2:Small] [N/A:N/A] Exudate Type: [1:Serosanguineous] [2:Serosanguineous] [N/A:N/A] Exudate Color: [1:red, brown] [2:red, brown] [N/A:N/A] Wound Margin: [1:Well defined, not attached Distinct, outline attached N/A] Granulation Amount: [1:Medium (34-66%)] [2:Small (1-33%)] [N/A:N/A] Granulation Quality: [1:Pink] [2:Pink] [N/A:N/A] Necrotic Amount: [1:Medium (34-66%)] [2:Large (67-100%)] [N/A:N/A] Exposed Structures: [1:Fat Layer (Subcutaneous Fat Layer (Subcutaneous N/A Tissue) Exposed: Yes Fascia: No Tendon: No Muscle: No Joint: No Bone: No] [2:Tissue) Exposed: Yes Fascia: No Tendon: No Muscle: No Joint: No Bone: No] Epithelialization: [1:Medium (34-66%)] [2:Medium (34-66%)] [N/A:N/A] Debridement: [1:Debridement - Excisional N/A] [N/A:N/A] Pre-procedure [1:12:10] [2:N/A] [N/A:N/A] Verification/Time Out Taken: Pain Control: [1:Lidocaine 4% Topical Solution] [2:N/A] [N/A:N/A] Tissue Debrided: [1:Subcutaneous, Slough] [2:N/A] [N/A:N/A] Level: [1:Skin/Subcutaneous Tissue] [2:N/A] [N/A:N/A] Debridement Area (sq cm):1.5 [2:N/A] [N/A:N/A] Instrument: [1:Curette] [2:N/A] [N/A:N/A] Bleeding: [1:Minimum] [2:N/A] [N/A:N/A] Hemostasis Achieved: [1:Pressure] [2:N/A] [N/A:N/A] Procedural Pain: [1:0] [2:N/A] [N/A:N/A] Post Procedural Pain: [1:0] [2:N/A] [N/A:N/A] Debridement Treatment Procedure was tolerated [2:N/A]  [N/A:N/A] Response: [1:well] Post Debridement [1:2.5x0.6x0.3] [2:N/A] [N/A:N/A] Measurements L x W x D (cm) Post Debridement [1:0.353] [2:N/A] [N/A:N/A] Volume: (cm) Procedures Performed: Debridement [2:N/A] [N/A:N/A] Treatment Notes Electronic Signature(s) Signed: 03/06/2019 5:54:58 PM By: Linton Ham MD Entered By: Linton Ham on 03/06/2019 12:40:40 -------------------------------------------------------------------------------- Multi-Disciplinary Care Plan Details Patient Name: Date of Service: Cephas Darby F. 03/06/2019 10:30 AM Medical Record [id number] Patient Account Number: [id number] Date of Birth/Sex: Treating RN: December 11, 1940 (79 y.o. Female) Deon Pilling Primary Care Kevan Prouty: [name], Betty Other Clinician: Referring Zeenat Jeanbaptiste: Treating Marcelus Dubberly/Extender:Robson, Michael [name], Cala Bradford in Treatment: 0 Active Inactive Abuse / Safety / Falls / Self Care Management Nursing Diagnoses: History of Falls Potential for falls Goals: Patient will remain injury free related to falls Date Initiated: 03/06/2019 Target Resolution Date: 03/21/2019 Goal Status: Active Patient/caregiver will verbalize/demonstrate measures taken to prevent injury and/or falls Date Initiated: 03/06/2019 Target Resolution Date: 03/21/2019 Goal Status: Active Interventions: Assess fall risk on admission and as needed Assess: immobility, friction, shearing, incontinence upon admission and as needed Notes: Orientation to the Wound Care Program Nursing Diagnoses: Knowledge deficit related to the wound healing center program Goals: Patient/caregiver will verbalize understanding  of the [place] Program Date Initiated: 03/06/2019 Target Resolution Date: 03/21/2019 Goal Status: Active Interventions: Provide education on orientation to the wound center Notes: Pain, Acute or Chronic Nursing Diagnoses: Pain, acute or chronic: actual or potential Potential alteration in comfort,  pain Goals: Patient will verbalize adequate pain control and receive pain control interventions during procedures as needed Date Initiated: 03/06/2019 Target Resolution Date: 03/21/2019 Goal Status: Active Patient/caregiver will verbalize comfort level met Date Initiated: 03/06/2019 Target Resolution Date: 03/21/2019 Goal Status: Active Interventions: Encourage patient to take pain medications as prescribed Provide education on pain management Reposition patient for comfort Treatment Activities: Administer pain control measures as ordered : 03/06/2019 Notes: Wound/Skin Impairment Nursing Diagnoses: Knowledge deficit related to ulceration/compromised skin integrity Goals: Patient/caregiver will verbalize understanding of skin care regimen Date Initiated: 03/06/2019 Target Resolution Date: 03/21/2019 Goal Status: Active Ulcer/skin breakdown will heal within 14 weeks Date Initiated: 03/06/2019 Target Resolution Date: 06/13/2019 Goal Status: Active Interventions: Assess patient/caregiver ability to obtain necessary supplies Assess patient/caregiver ability to perform ulcer/skin care regimen upon admission and as needed Provide education on ulcer and skin care Treatment Activities: Skin care regimen initiated : 03/06/2019 Topical wound management initiated : 03/06/2019 Notes: Electronic Signature(s) Signed: 03/06/2019 6:20:01 PM By: Deon Pilling Entered By: Deon Pilling on 03/06/2019 11:30:48 -------------------------------------------------------------------------------- Pain Assessment Details Patient Name: Date of Service: RYANNA, TESCHNER 03/06/2019 10:30 AM Medical Record [id number] Patient Account Number: [id number] Date of Birth/Sex: Treating RN: 11/19/1940 (79 y.o. Female) Kela Millin Primary Care Shacara Cozine: [name], Betty Other Clinician: Referring Melony Tenpas: Treating Wilber Fini/Extender:Robson, Michael [name], Cala Bradford in Treatment: 0 Active Problems Location of  Pain Severity and Description of Pain Patient Has Paino No Site Locations Pain Management and Medication Current Pain Management: Electronic Signature(s) Signed: 03/06/2019 6:15:07 PM By: Kela Millin Entered By: Kela Millin on 03/06/2019 11:45:31 -------------------------------------------------------------------------------- Patient/Caregiver Education Details Patient Name: [name] 1/21/2021andnbsp10:30 Date of Service: AM Medical Record [id number] Number: Patient Account Number: [id number] Treating RN: 1940-07-22 (78 y.o. Deon Pilling Date of Birth/Gender: Female) Other Clinician: Primary Care Treating [name], Betty Robson, Michael Physician: Physician/Extender: Referring Physician: [name], Betty Weeks in Treatment: 0 Education Assessment Education Provided To: Patient and Caregiver Education Topics Provided Pain: Handouts: A Guide to Pain Control Methods: Explain/Verbal, Printed Responses: Reinforcements needed Welcome To [facility] [place]: Handouts: Welcome To [facility] [place] Methods: Explain/Verbal, Printed Responses: Reinforcements needed Wound/Skin Impairment: Handouts: Caring for Your Ulcer, Skin Care Do's and Dont's Methods: Explain/Verbal, Printed Responses: Reinforcements needed Electronic Signature(s) Signed: 03/06/2019 6:20:01 PM By: Deon Pilling Entered By: Deon Pilling on 03/06/2019 11:46:56 -------------------------------------------------------------------------------- Wound Assessment Details Patient Name: Date of Service: WILMER, BERRYHILL 03/06/2019 10:30 AM Medical Record [id number] Patient Account Number: [id number] Date of Birth/Sex: Treating RN: 1941/01/30 (80 y.o. Female) Kela Millin Primary Care Carlyon Nolasco: [name], Betty Other Clinician: Referring Amedee Cerrone: Treating Pearlene Teat/Extender:Robson, Michael [name], Cala Bradford in Treatment: 0 Wound Status Wound Number: 1 Primary Open Surgical  Wound Etiology: Wound Location: Back - Medial, Distal Wound Status: Open Wounding Event: Surgical Injury Comorbid Arrhythmia, Hypertension, Type II Date Acquired: 01/08/2019 History: Diabetes Weeks Of Treatment: 0 Clustered Wound: No Photos Wound Measurements Length: (cm) 2.5 % Reduct Width: (cm) 0.6 % Reduct Depth: (cm) 0.3 Epitheli Area: (cm) 1.178 Tunneli Volume: (cm) 0.353 Undermi Wound Description Classification: Full Thickness Without Exposed Support Foul Odo Structures Slough/F Wound Well defined, not attached Margin: Exudate Small Amount: Exudate Serosanguineous Type: Exudate red, brown Color: Wound Bed Granulation Amount: Medium (34-66%) Granulation Quality: Pink Fascia E Necrotic Amount:  Medium (34-66%) Fat Laye Necrotic Quality: Adherent Slough Tendon E Muscle E Joint Ex Bone Exp r After Cleansing: No ibrino Yes Exposed Structure xposed: No r (Subcutaneous Tissue) Exposed: Yes xposed: No xposed: No posed: No osed: No ion in Area: 0% ion in Volume: 0% alization: Medium (34-66%) ng: No ning: No Electronic Signature(s) Signed: 03/06/2019 4:35:12 PM By: Mikeal Hawthorne EMT/HBOT Signed: 03/06/2019 6:15:07 PM By: Kela Millin Entered By: Mikeal Hawthorne on 03/06/2019 14:39:55 -------------------------------------------------------------------------------- Wound Assessment Details Patient Name: Date of Service: DONDI, BURANDT 03/06/2019 10:30 AM Medical Record [id number] Patient Account Number: [id number] Date of Birth/Sex: Treating RN: 1940-10-09 (79 y.o. Female) Kela Millin Primary Care Rosmery Duggin: [name], Betty Other Clinician: Referring Aldeen Riga: Treating Lyndell Gillyard/Extender:Robson, Michael [name], Cala Bradford in Treatment: 0 Wound Status Wound Number: 2 Primary Open Surgical Wound Etiology: Wound Location: Back - Midline, Distal Wound Status: Open Wounding Event: Surgical Injury Comorbid Arrhythmia, Hypertension, Type  II Date Acquired: 01/08/2019 History: Diabetes Weeks Of Treatment: 0 Clustered Wound: No Photos Wound Measurements Length: (cm) 0.2 % Reduct Width: (cm) 0.2 % Reduct Depth: (cm) 0.1 Epitheli Area: (cm) 0.031 Tunneli Volume: (cm) 0.003 Undermi Wound Description Classification: Full Thickness Without Exposed Support Foul Odo Structures Slough/F Wound Distinct, outline attached Margin: Exudate Small Amount: Exudate Serosanguineous Type: Exudate red, brown Color: Wound Bed Granulation Amount: Small (1-33%) Granulation Quality: Pink Fascia E Necrotic Amount: Large (67-100%) Fat Laye Necrotic Quality: Adherent Slough Tendon E Muscle E Joint Ex Bone Exp r After Cleansing: No ibrino Yes Exposed Structure xposed: No r (Subcutaneous Tissue) Exposed: Yes xposed: No xposed: No posed: No osed: No ion in Area: 0% ion in Volume: 0% alization: Medium (34-66%) ng: No ning: No Electronic Signature(s) Signed: 03/06/2019 4:35:12 PM By: Mikeal Hawthorne EMT/HBOT Signed: 03/06/2019 6:15:07 PM By: Kela Millin Entered By: Mikeal Hawthorne on 03/06/2019 14:39:35 -------------------------------------------------------------------------------- Vitals Details Patient Name: Date of Service: [name]. 03/06/2019 10:30 AM Medical Record [id number] Patient Account Number: [id number] Date of Birth/Sex: Treating RN: October 15, 1940 (79 y.o. Female) Kela Millin Primary Care Nadiah Corbit: [name], Betty Other Clinician: Referring Dwan Fennel: Treating Gayleen Sholtz/Extender:Robson, Michael [name], Cala Bradford in Treatment: 0 Vital Signs Time Taken: 11:20 Temperature (F): 98.3 Height (in): 61 Pulse (bpm): 68 Source: Stated Respiratory Rate (breaths/min): 18 Weight (lbs): 200 Blood Pressure (mmHg): 153/60 Source: Stated Capillary Blood Glucose (mg/dl): 129 Body Mass Index (BMI): 37.8 Reference Range: 80 - 120 mg / dl Notes CBG of 129 Electronic Signature(s) Signed:  03/06/2019 6:15:07 PM By: Kela Millin Entered By: Kela Millin on 03/06/2019 11:31:33

## 2019-03-07 DIAGNOSIS — M4804 Spinal stenosis, thoracic region: Secondary | ICD-10-CM | POA: Diagnosis not present

## 2019-03-10 DIAGNOSIS — T8131XA Disruption of external operation (surgical) wound, not elsewhere classified, initial encounter: Secondary | ICD-10-CM | POA: Diagnosis not present

## 2019-03-11 DIAGNOSIS — M4804 Spinal stenosis, thoracic region: Secondary | ICD-10-CM | POA: Diagnosis not present

## 2019-03-13 ENCOUNTER — Encounter (HOSPITAL_BASED_OUTPATIENT_CLINIC_OR_DEPARTMENT_OTHER): Payer: PPO | Admitting: Internal Medicine

## 2019-03-13 ENCOUNTER — Other Ambulatory Visit: Payer: Self-pay

## 2019-03-13 DIAGNOSIS — E11622 Type 2 diabetes mellitus with other skin ulcer: Secondary | ICD-10-CM | POA: Diagnosis not present

## 2019-03-13 DIAGNOSIS — T8131XA Disruption of external operation (surgical) wound, not elsewhere classified, initial encounter: Secondary | ICD-10-CM | POA: Diagnosis not present

## 2019-03-13 NOTE — Progress Notes (Signed)
JEANY, PAGLIUCA ([id number]) Visit Report for 03/13/2019 Debridement Details Patient Name: Date of Service: [name], [name] 03/13/2019 12:30 PM Medical Record [id number] Patient Account Number: [id number] Date of Birth/Sex: Treating RN: 07-23-1940 (79 y.o. Helene Shoe, Tammi Klippel Primary Care Provider: [name], Betty Other Clinician: Referring Provider: Treating Provider/Extender:Porche Steinberger [name], Cala Bradford in Treatment: 1 Debridement Performed for Wound #1 Distal,Midline Back Assessment: Performed By: Physician Ricard Dillon., MD Debridement Type: Debridement Level of Consciousness (Pre- Awake and Alert procedure): Pre-procedure Yes - 13:18 Verification/Time Out Taken: Start Time: 13:19 Pain Control: Lidocaine 4% Topical Solution Total Area Debrided (L x W): 2.5 (cm) x 0.3 (cm) = 0.75 (cm) Tissue and other material Viable, Non-Viable, Subcutaneous, Skin: Dermis , Fibrin/Exudate debrided: Level: Skin/Subcutaneous Tissue Debridement Description: Excisional Instrument: Curette Bleeding: Minimum Hemostasis Achieved: Pressure End Time: 13:23 Procedural Pain: 0 Post Procedural Pain: 0 Response to Treatment: Procedure was tolerated well Level of Consciousness Awake and Alert (Post-procedure): Post Debridement Measurements of Total Wound Length: (cm) 2.5 Width: (cm) 0.3 Depth: (cm) 0.1 Volume: (cm) 0.059 Character of Wound/Ulcer Post Improved Debridement: Post Procedure Diagnosis Same as Pre-procedure Electronic Signature(s) Signed: 03/13/2019 5:54:40 PM By: Linton Ham MD Signed: 03/13/2019 6:05:55 PM By: Deon Pilling Entered By: Linton Ham on 03/13/2019 13:32:30 -------------------------------------------------------------------------------- HPI Details Patient Name: Date of Service: Leah Robinsons. 03/13/2019 12:30 PM Medical Record [id number] Patient Account Number: [id number] Date of Birth/Sex: Treating RN: 10/04/1940 (79 y.o. Debby Bud Primary Care Provider: [name], Betty Other Clinician: Referring Provider: Treating Provider/Extender:Frady Taddeo [name], Cala Bradford in Treatment: 1 History of Present Illness HPI Description: ADMISSION 03/06/2019 This is a patient with a history of severe thoracic myelopathy admitted to hospital from 01/08/2019 through 01/10/2019 for surgical decompression decompression with fusion at T10-T11. Surgery was done by Dr. [name]. According to her daughter who is a nurse who accompanied her the wound dehisced about 2 weeks later. Initially they were using Betadine but at the suggestion of Dr. Sharol Given who the daughter knows they switched to Iodosorb ointment and this really has done a nice job cleaning up the wound surface by review of pictures on the daughter's phone Past medical history type 2 diabetes with peripheral neuropathy, paroxysmal atrial tachycardia, history of pancreatitis, history of fibromyalgia, vertigo and generalized arthritis as well as severe degenerative disc disease at the C and T-spine levels as well as L-spine 1/28; patient admitted to the clinic last week. She has 2 small wounds in the middle of a thoracic decompression surgical scar. The superior one is almost closed however the longer one in the middle part of the surgical scar is only 50% epithelialized the rest of the 50% has had very adherent fibrinous debris. They already have Iodosorb that was prescribed by Dr. Sharol Given therefore will change to this on this area. Santyl would certainly be an Contractor) Signed: 03/13/2019 5:54:40 PM By: Linton Ham MD Entered By: Linton Ham on 03/13/2019 13:33:47 -------------------------------------------------------------------------------- Physical Exam Details Patient Name: Date of Service: [name], [name] 03/13/2019 12:30 PM Medical Record [id number] Patient Account Number: [id number] Date of Birth/Sex: Treating RN: Jul 02, 1940  (79 y.o. Debby Bud Primary Care Provider: [name], Betty Other Clinician: Referring Provider: Treating Provider/Extender:Jatoria Kneeland [name], Cala Bradford in Treatment: 1 Notes Wound exam; the area in question is at the T10 on T11 area. There are 2 open areas here last time I think the superior 1 is just about closed. The longer inferior 1 is long and narrow however about 50% of the width of  this is still covered and adherent debris. Difficult debridement with a #3 curette to remove this. Electronic Signature(s) Signed: 03/13/2019 5:54:40 PM By: Linton Ham MD Entered By: Linton Ham on 03/13/2019 13:38:21 -------------------------------------------------------------------------------- Physician Orders Details Patient Name: Date of Service: [name], [name] 03/13/2019 12:30 PM Medical Record [id number] Patient Account Number: [id number] Date of Birth/Sex: Treating RN: 05-05-1940 (79 y.o. 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 Dressing Change Frequency Change Dressing every other day. Skin Barriers/Peri-Wound Care Skin Prep Wound Cleansing May shower and wash wound with soap and water. - with dressing changes. Primary Wound Dressing Wound #1 Distal,Midline Back Iodosorb Ointment Wound #2 Proximal,Midline Back Silver Collagen - moisten hydrogel or KY jelly Secondary Dressing Wound #1 Distal,Midline Back Foam Border - or bordered gauze. Wound #2 Proximal,Midline Back Foam Border - or bordered gauze. Off-Loading Turn and  reposition every 2 hours Electronic Signature(s) Signed: 03/13/2019 5:54:40 PM By: Linton Ham MD Signed: 03/13/2019 6:05:55 PM By: Deon Pilling Entered By: Deon Pilling on 03/13/2019 13:23:56 -------------------------------------------------------------------------------- Problem List Details Patient Name: Date of Service: [name], [name] 03/13/2019 12:30 PM Medical Record [id number] Patient Account Number: [id number] Date of Birth/Sex: Treating RN: [birth year] (79 y.o. Debby Bud Primary Care Provider: [name], Betty Other Clinician: Referring Provider: Treating Provider/Extender:Jamus Loving [name], Cala Bradford in Treatment: 1 Active Problems ICD-10 Evaluated Encounter Code Description Active Date Today Diagnosis T81.31XD Disruption of external operation (surgical) wound, not 03/06/2019 No Yes elsewhere classified, subsequent encounter L98.422 Non-pressure chronic ulcer of back with fat layer 03/06/2019 No Yes exposed Inactive Problems Resolved Problems Electronic Signature(s) Signed: 03/13/2019 5:54:40 PM By: Linton Ham MD Entered By: Linton Ham on 03/13/2019 13:32:15 -------------------------------------------------------------------------------- Progress Note Details Patient Name: Date of Service: Leah Robinsons. 03/13/2019 12:30 PM Medical Record [id number] Patient Account Number: [id number] Date of Birth/Sex: Treating RN: 09/09/1940 (79 y.o. Debby Bud Primary Care Provider: [name], Betty Other Clinician: Referring Provider: Treating Provider/Extender:Toddrick Sanna [name], Cala Bradford in Treatment: 1 Subjective History of Present Illness (HPI) ADMISSION 03/06/2019 This is a patient with a history of severe thoracic myelopathy admitted to hospital from 01/08/2019 through 01/10/2019 for surgical decompression decompression with fusion at T10-T11. Surgery was done by Dr. [name]. According to her daughter who is a nurse who  accompanied her the wound dehisced about 2 weeks later. Initially they were using Betadine but at the suggestion of Dr. Sharol Given who the daughter knows they switched to Iodosorb ointment and this really has done a nice job cleaning up the wound surface by review of pictures on the daughter's phone Past medical history type 2 diabetes with peripheral neuropathy, paroxysmal atrial tachycardia, history of pancreatitis, history of fibromyalgia, vertigo and generalized arthritis as well as severe degenerative disc disease at the C and T-spine levels as well as L-spine 1/28; patient admitted to the clinic last week. She has 2 small wounds in the middle of a thoracic decompression surgical scar. The superior one is almost closed however the longer one in the middle part of the surgical scar is only 50% epithelialized the rest of the 50% has had very adherent fibrinous debris. They already have Iodosorb that was prescribed by Dr. Sharol Given therefore will change to this on  this area. Santyl would certainly be an option Objective Constitutional Vitals Time Taken: 12:56 PM, Height: 61 in, Weight: 200 lbs, BMI: 37.8, Temperature: 98.6 F, Pulse: 74 bpm, Respiratory Rate: 18 breaths/min, Blood Pressure: 145/82 mmHg, Capillary Blood Glucose: 228 mg/dl. General Notes: patients current CBG (with personal monitor) was 228 Integumentary (Hair, Skin) Wound #1 status is Open. Original cause of wound was Surgical Injury. The wound is located on the Lluver[place] Back. The wound measures 2.5cm length x 0.3cm width x 0.1cm depth; 0.589cm^2 area and 0.059cm^3 volume. There is Fat Layer (Subcutaneous Tissue) Exposed exposed. There is no tunneling or undermining noted. There is a small amount of serous drainage noted. The wound margin is well defined and not attached to the wound base. There is medium (34-66%) pink granulation within the wound bed. There is a medium (34-66%) amount of necrotic tissue within the wound bed  including Adherent Slough. Wound #2 status is Open. Original cause of wound was Surgical Injury. The wound is located on the Proximal,Midline Back. The wound measures 0.3cm length x 0.2cm width x 0.2cm depth; 0.047cm^2 area and 0.009cm^3 volume. There is Fat Layer (Subcutaneous Tissue) Exposed exposed. There is no tunneling or undermining noted. There is a small amount of serous drainage noted. The wound margin is distinct with the outline attached to the wound base. There is small (1-33%) pink granulation within the wound bed. There is a large (67- 100%) amount of necrotic tissue within the wound bed including Adherent Slough. 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 There was a Excisional Skin/Subcutaneous Tissue Debridement with a total area of 0.75 sq cm performed by Ricard Dillon., MD. With the following instrument(s): Curette to remove Viable and Non-Viable tissue/material. Material removed includes Subcutaneous Tissue, Skin: Dermis, and Fibrin/Exudate after achieving pain control using Lidocaine 4% Topical Solution. A time out was conducted at 13:18, prior to the start of the procedure. A Minimum amount of bleeding was controlled with Pressure. The procedure was tolerated well with a pain level of 0 throughout and a pain level of 0 following the procedure. Post Debridement Measurements: 2.5cm length x 0.3cm width x 0.1cm depth; 0.059cm^3 volume. Character of Wound/Ulcer Post Debridement is improved. Post procedure Diagnosis Wound #1: Same as Pre-Procedure Plan Follow-up Appointments: Return Appointment in 1 week. Dressing Change Frequency: Change Dressing every other day. Skin Barriers/Peri-Wound Care: Skin  Prep Wound Cleansing: May shower and wash wound with soap and water. - with dressing changes. Primary Wound Dressing: Wound #1 Distal,Midline Back: Iodosorb Ointment Wound #2 Proximal,Midline Back: Silver Collagen - moisten hydrogel or KY jelly Secondary Dressing: Wound #1 Distal,Midline Back: Foam Border - or bordered gauze. Wound #2 Proximal,Midline Back: Foam Border - or bordered gauze. Off-Loading: Turn and reposition every 2 hours 1. I switch back to the Iodosorb ointment on the lower wound 2. Hopefully this will clean out the wound bed again. Santyl would have been an option 3. The superior wound is just about closed. Electronic Signature(s) Signed: 03/13/2019 5:54:40 PM By: Linton Ham MD Entered By: Linton Ham on 03/13/2019 13:39:18 -------------------------------------------------------------------------------- SuperBill Details Patient Name: Date of Service: [name], [name] 03/13/2019 Medical Record [id number] Patient Account Number: [id number] Date of Birth/Sex: Treating RN: 1940-04-15 (78 y.o. 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 Facility Procedures The patient participates with Medicare or their insurance follows the Medicare Facility Guidelines: CPT4 Code Description Modifier Quantity [id number] - DEB SUBQ TISSUE 20 SQ CM/< 1 ICD-10 Diagnosis Description L98.422 Non-pressure chronic ulcer of  back with fat layer exposed Physician Procedures CPT4 Code: [id number] Description: 11042 - WC PHYS SUBQ TISS 20 SQ CM ICD-10 Diagnosis Description L98.422 Non-pressure chronic ulcer of back with fat layer  expose Modifier: d Quantity: 1 Electronic Signature(s) Signed: 03/13/2019 5:54:40 PM By: Linton Ham MD Entered By: Linton Ham on 03/13/2019 13:39:29

## 2019-03-14 DIAGNOSIS — M4804 Spinal stenosis, thoracic region: Secondary | ICD-10-CM | POA: Diagnosis not present

## 2019-03-14 NOTE — Progress Notes (Addendum)
[name], [name] ([id number]) Visit Report for 03/13/2019 Arrival Information Details Patient Name: Date of Service: [name], [name] 03/13/2019 12:30 PM Medical Record [id number] Patient Account Number: [id number] Date of Birth/Sex: Treating RN: 10-Feb-1941 (79 y.o. Clearnce Sorrel Primary Care Jaylee Lantry: [name], Betty Other Clinician: Referring Haillee Johann: Treating Slater Mcmanaman/Extender:Robson, Michael [name], Cala Bradford in Treatment: 1 Visit Information History Since Last Visit Added or deleted any medications: No Patient Arrived: Gilford Rile Any new allergies or adverse reactions: No Arrival Time: 12:55 Had a fall or experienced change in No Accompanied By: family members activities of daily living that may affect Transfer Assistance: None risk of falls: Patient Identification Verified: Yes Signs or symptoms of abuse/neglect since last No Secondary Verification Process Yes visito Completed: Hospitalized since last visit: No Patient Has Alerts: Yes Implantable device outside of the clinic excluding No Patient Alerts: patient takes cellular tissue based products placed in the center aspirin since last visit: Has Dressing in Place as Prescribed: Yes Pain Present Now: No Electronic Signature(s) Signed: 03/13/2019 5:20:11 PM By: Kela Millin Entered By: Kela Millin on 03/13/2019 12:56:01 -------------------------------------------------------------------------------- Encounter Discharge Information Details Patient Name: Date of Service: [name]. 03/13/2019 12:30 PM Medical Record [id number] Patient Account Number: [id number] Date of Birth/Sex: Treating RN: August 25, 1940 (79 y.o. Elam Dutch Primary Care Jules Vidovich: [name], Betty Other Clinician: Referring Laveta Gilkey: Treating Meron Bocchino/Extender:Robson, Michael [name], Cala Bradford in Treatment: 1 Encounter Discharge Information Items Post Procedure Vitals Discharge Condition: Stable Temperature  (F): 98.6 Ambulatory Status: Walker Pulse (bpm): 74 Discharge Destination: Home Respiratory Rate (breaths/min): 18 Transportation: Private Auto Blood Pressure (mmHg): 145/82 Accompanied By: daughter Schedule Follow-up Appointment: Yes Clinical Summary of Care: Patient Declined Electronic Signature(s) Signed: 03/14/2019 8:33:08 AM By: Baruch Gouty RN, BSN Entered By: Baruch Gouty on 03/13/2019 13:34:19 -------------------------------------------------------------------------------- Lower Extremity Assessment Details Patient Name: Date of Service: Leah, [name] 03/13/2019 12:30 PM Medical Record [id number] Patient Account Number: [id number] Date of Birth/Sex: Treating RN: July 16, 1940 (79 y.o. Clearnce Sorrel Primary Care Jairy Angulo: [name], Betty Other Clinician: Referring Beckham Buxbaum: Treating Akeia Perot/Extender:Robson, Michael [name], Cala Bradford in Treatment: 1 Electronic Signature(s) Signed: 03/13/2019 5:20:11 PM By: Kela Millin Entered By: Kela Millin on 03/13/2019 12:58:03 -------------------------------------------------------------------------------- Multi Wound Chart Details Patient Name: Date of Service: Leah, [name] 03/13/2019 12:30 PM Medical Record [id number] Patient Account Number: [id number] Date of Birth/Sex: Treating RN: January 29, 1941 (79 y.o. Helene Shoe, Tammi Klippel Primary Care Augusta Hilbert: [name], Betty Other Clinician: Referring Bladimir Auman: Treating Esta Carmon/Extender:Robson, Michael [name], Cala Bradford in Treatment: 1 Vital Signs Height(in): 61 Capillary Blood 228 Glucose(mg/dl): Weight(lbs): 200 Pulse(bpm): 38 Body Mass Index(BMI): 12 Blood Pressure(mmHg): 145/82 Temperature(F): 98.6 Respiratory 18 Rate(breaths/min): Photos: [1:No Photos] [2:No Photos] [N/A:N/A] Wound Location: [1:Back - Midline, Distal] [2:Back - Midline, Proximal] [N/A:N/A] Wounding Event: [1:Surgical Injury] [2:Surgical Injury] [N/A:N/A] Primary Etiology:  [1:Open Surgical Wound] [2:Open Surgical Wound] [N/A:N/A] Comorbid History: [1:Arrhythmia, Hypertension, Arrhythmia, Hypertension, N/A Type II Diabetes] [2:Type II Diabetes] Date Acquired: [1:01/08/2019] [2:01/08/2019] [N/A:N/A] Weeks of Treatment: [1:1] [2:1] [N/A:N/A] Wound Status: [1:Open] [2:Open] [N/A:N/A] Measurements L x W x D 2.5x0.3x0.1 [2:0.3x0.2x0.2] [N/A:N/A] (cm) Area (cm) : [1:0.589] [2:0.047] [N/A:N/A] Volume (cm) : [1:0.059] [2:0.009] [N/A:N/A] % Reduction in Area: [1:50.00%] [2:-51.60%] [N/A:N/A] % Reduction in Volume: 83.30% [2:-200.00%] [N/A:N/A] Classification: [1:Full Thickness Without Exposed Support Structures Exposed Support Structures] [2:Full Thickness Without] [N/A:N/A] Exudate Amount: [1:Small] [2:Small] [N/A:N/A] Exudate Type: [1:Serous] [2:Serous] [N/A:N/A] Exudate Color: [1:amber] [2:amber] [N/A:N/A] Wound Margin: [1:Well defined, not attached Distinct, outline attached N/A] Granulation Amount: [1:Medium (34-66%)] [2:Small (1-33%)] [N/A:N/A] Granulation Quality: [1:Pink] [2:Pink] [N/A:N/A] Necrotic Amount: [1:Medium (  34-66%)] [2:Large (67-100%)] [N/A:N/A] Exposed Structures: [1:Fat Layer (Subcutaneous Fat Layer (Subcutaneous N/A Tissue) Exposed: Yes Fascia: No Tendon: No Muscle: No Joint: No Bone: No] [2:Tissue) Exposed: Yes Fascia: No Tendon: No Muscle: No Joint: No Bone: No] Epithelialization: [1:Medium (34-66%)] [2:Medium (34-66%)] [N/A:N/A] Debridement: [1:Debridement - Excisional N/A] [N/A:N/A] Pre-procedure [1:13:18] [2:N/A] [N/A:N/A] Verification/Time Out Taken: Pain Control: [1:Lidocaine 4% Topical Solution] [2:N/A] [N/A:N/A] Tissue Debrided: [1:Subcutaneous] [2:N/A] [N/A:N/A] Level: [1:Skin/Subcutaneous Tissue N/A] [N/A:N/A] Debridement Area (sq cm):0.75 [2:N/A] [N/A:N/A] Instrument: [1:Curette] [2:N/A] [N/A:N/A] Bleeding: [1:Minimum] [2:N/A] [N/A:N/A] Hemostasis Achieved: [1:Pressure] [2:N/A] [N/A:N/A] Procedural Pain: [1:0] [2:N/A]  [N/A:N/A] Post Procedural Pain: [1:0] [2:N/A] [N/A:N/A] Debridement Treatment Procedure was tolerated [2:N/A] [N/A:N/A] Response: [1:well] Post Debridement [1:2.5x0.3x0.1] [2:N/A] [N/A:N/A] Measurements L x W x D (cm) Post Debridement [1:0.059] [2:N/A] [N/A:N/A] Volume: (cm) Procedures Performed: Debridement [2:N/A] [N/A:N/A] Treatment Notes Electronic Signature(s) Signed: 03/13/2019 5:54:40 PM By: Linton Ham MD Signed: 03/13/2019 6:05:55 PM By: Deon Pilling Entered By: Linton Ham on 03/13/2019 13:32:22 -------------------------------------------------------------------------------- Multi-Disciplinary Care Plan Details Patient Name: Date of Service: BRITTANIA, SUDBECK 03/13/2019 12:30 PM Medical Record [id number] Patient Account Number: [id number] Date of Birth/Sex: Treating RN: 1940/03/27 (79 y.o. Helene Shoe, Tammi Klippel Primary Care Janecia Palau: [name], Betty Other Clinician: Referring Coury Grieger: Treating Chad Donoghue/Extender:Robson, Michael [name], Cala Bradford in Treatment: 1 Active Inactive Abuse / Safety / Falls / Self Care Management Nursing Diagnoses: History of Falls Potential for falls Goals: Patient will remain injury free related to falls Date Initiated: 03/06/2019 Target Resolution Date: 03/21/2019 Goal Status: Active Patient/caregiver will verbalize/demonstrate measures taken to prevent injury and/or falls Date Initiated: 03/06/2019 Target Resolution Date: 03/21/2019 Goal Status: Active Interventions: Assess fall risk on admission and as needed Assess: immobility, friction, shearing, incontinence upon admission and as needed Notes: Pain, Acute or Chronic Nursing Diagnoses: Pain, acute or chronic: actual or potential Potential alteration in comfort, pain Goals: Patient will verbalize adequate pain control and receive pain control interventions during procedures as needed Date Initiated: 03/06/2019 Target Resolution Date: 03/21/2019 Goal Status:  Active Patient/caregiver will verbalize comfort level met Date Initiated: 03/06/2019 Target Resolution Date: 03/21/2019 Goal Status: Active Interventions: Encourage patient to take pain medications as prescribed Provide education on pain management Reposition patient for comfort Treatment Activities: Administer pain control measures as ordered : 03/06/2019 Notes: Wound/Skin Impairment Nursing Diagnoses: Knowledge deficit related to ulceration/compromised skin integrity Goals: Patient/caregiver will verbalize understanding of skin care regimen Date Initiated: 03/06/2019 Target Resolution Date: 03/21/2019 Goal Status: Active Ulcer/skin breakdown will heal within 14 weeks Date Initiated: 03/06/2019 Target Resolution Date: 06/13/2019 Goal Status: Active Interventions: Assess patient/caregiver ability to obtain necessary supplies Assess patient/caregiver ability to perform ulcer/skin care regimen upon admission and as needed Provide education on ulcer and skin care Treatment Activities: Skin care regimen initiated : 03/06/2019 Topical wound management initiated : 03/06/2019 Notes: Electronic Signature(s) Signed: 03/13/2019 6:05:55 PM By: Deon Pilling Entered By: Deon Pilling on 03/13/2019 13:27:19 -------------------------------------------------------------------------------- Pain Assessment Details Patient Name: Date of ServiceKATHARIN, SCHNEIDER 03/13/2019 12:30 PM Medical Record [id number] Patient Account Number: [id number] Date of Birth/Sex: Treating RN: Mar 27, 1940 (79 y.o. Clearnce Sorrel Primary Care Star Resler: [name], Betty Other Clinician: Referring Socorro Ebron: Treating Elber Galyean/Extender:Robson, Michael [name], Cala Bradford in Treatment: 1 Active Problems Location of Pain Severity and Description of Pain Patient Has Paino No Site Locations Pain Management and Medication Current Pain Management: Electronic Signature(s) Signed: 03/13/2019 5:20:11 PM By: Kela Millin Entered By: Kela Millin on 03/13/2019 12:57:36 -------------------------------------------------------------------------------- Patient/Caregiver Education Details Patient Name: Date of Service: [name] 1/28/2021andnbsp12:30 PM Medical Record [id number]  Patient Account Number: [id number] Date of Birth/Gender: Treating RN: Apr 02, 1940 (78 y.o. Debby Bud Primary Care Physician: [name], Betty Other Clinician: Referring Physician: Treating Physician/Extender:Robson, Michael [name], Cala Bradford in Treatment: 1 Education Assessment Education Provided To: Patient Education Topics Provided Pain: Handouts: A Guide to Pain Control Methods: Explain/Verbal Responses: Reinforcements needed Electronic Signature(s) Signed: 03/13/2019 6:05:55 PM By: Deon Pilling Entered By: Deon Pilling on 03/13/2019 13:27:32 -------------------------------------------------------------------------------- Wound Assessment Details Patient Name: Date of Service: DAIZEE, FIRMIN 03/13/2019 12:30 PM Medical Record [id number] Patient Account Number: [id number] Date of Birth/Sex: Treating RN: 1940-04-27 (79 y.o. Helene Shoe, Tammi Klippel Primary Care Kharlie Bring: [name], Betty Other Clinician: Referring Nicholaus Steinke: Treating Jannine Abreu/Extender:Robson, Michael [name], Cala Bradford in Treatment: 1 Wound Status Wound Number: 1 Primary Open Surgical Wound Etiology: Wound Location: Distal, Midline Back Wound Status: Open Wounding Event: Surgical Injury Comorbid Arrhythmia, Hypertension, Type II Date Acquired: 01/08/2019 History: Diabetes Weeks Of Treatment: 1 Clustered Wound: No Photos Wound Measurements Length: (cm) 2.5 % Reduc Width: (cm) 0.3 % Reduc Depth: (cm) 0.1 Epithel Area: (cm) 0.589 Tunnel Volume: (cm) 0.059 Underm Wound Description Classification: Full Thickness Without Exposed Support [place] Wound Well defined, not attached Margin: Exudate  Small Amount: Exudate Serous Type: Exudate amber Color: Wound Bed Granulation Amount: Medium (34-66%) Granulation Quality: Pink Fascia Necrotic Amount: Medium (34-66%) Fat Lay Necrotic Quality: Adherent Slough Tendon Muscle Joint E Bone Ex Treatment Notes Wound #1 (Distal, Midline Back) 3. Primary Dressing Applied Iodoflex 4. Secondary Dressing Foam Border Dressing dor After Cleansing: No /Fibrino Yes Exposed Structure Exposed: No er (Subcutaneous Tissue) Exposed: Yes Exposed: No Exposed: No xposed: No posed: No tion in Area: 50% tion in Volume: 83.3% ialization: Medium (34-66%) ing: No ining: No Electronic Signature(s) Signed: 03/14/2019 4:26:31 PM By: Mikeal Hawthorne EMT/HBOT Signed: 03/14/2019 6:00:37 PM By: Deon Pilling Previous Signature: 03/13/2019 5:20:11 PM Version By: Kela Millin Entered By: Mikeal Hawthorne on 03/14/2019 15:14:50 -------------------------------------------------------------------------------- Wound Assessment Details Patient Name: Date of Service: TALANI, BRAZEE 03/13/2019 12:30 PM Medical Record [id number] Patient Account Number: [id number] Date of Birth/Sex: Treating RN: 04-25-1940 (79 y.o. Helene Shoe, Tammi Klippel Primary Care Iram Astorino: [name], Betty Other Clinician: Referring Layia Walla: Treating Shawn Dannenberg/Extender:Robson, Michael [name], Cala Bradford in Treatment: 1 Wound Status Wound Number: 2 Primary Open Surgical Wound Etiology: Wound Location: Back - Midline, Proximal Wound Status: Open Wounding Event: Surgical Injury Comorbid Arrhythmia, Hypertension, Type II Date Acquired: 01/08/2019 History: Diabetes Weeks Of Treatment: 1 Clustered Wound: No Photos Wound Measurements Length: (cm) 0.3 % Reduc Width: (cm) 0.2 % Reduc Depth: (cm) 0.2 Epithel Area: (cm) 0.047 Tunnel Volume: (cm) 0.009 Underm Wound Description Full Thickness Without Exposed Support Foul O Classification: Structures Slough Wound Distinct, outline  attached Margin: Exudate Small Amount: Exudate Serous Type: Exudate amber Color: Wound Bed Granulation Amount: Small (1-33%) Granulation Quality: Pink Fascia Ex Necrotic Amount: Large (67-100%) Fat Layer Necrotic Quality: Adherent Slough Tendon Ex Muscle Ex Joint Exp Bone Expo dor After Cleansing: No /Fibrino Yes Exposed Structure posed: No (Subcutaneous Tissue) Exposed: Yes posed: No posed: No osed: No sed: No tion in Area: -51.6% tion in Volume: -200% ialization: Medium (34-66%) ing: No ining: No Treatment Notes Wound #2 (Proximal, Midline Back) 3. Primary Dressing Applied Collegen [employer] 4. Secondary Dressing Foam Border Dressing Electronic Signature(s) Signed: 03/14/2019 4:26:31 PM By: Mikeal Hawthorne EMT/HBOT Signed: 03/14/2019 6:00:37 PM By: Deon Pilling Previous Signature: 03/13/2019 5:20:11 PM Version By: Kela Millin Entered By: Mikeal Hawthorne on 03/14/2019 15:15:18 -------------------------------------------------------------------------------- Vitals Details Patient Name: Date of Service: [place], Wallene Dales.  03/13/2019 12:30 PM Medical Record [id number] Patient Account Number: [id number] Date of Birth/Sex: Treating RN: 1940-06-02 (79 y.o. Clearnce Sorrel Primary Care Jveon Pound: [name], Betty Other Clinician: Referring Ladavion Savitz: Treating Skanda Worlds/Extender:Robson, Michael [name], Cala Bradford in Treatment: 1 Vital Signs Time Taken: 12:56 Temperature (F): 98.6 Height (in): 61 Pulse (bpm): 74 Weight (lbs): 200 Respiratory Rate (breaths/min): 18 Body Mass Index (BMI): 37.8 Blood Pressure (mmHg): 145/82 Capillary Blood Glucose (mg/dl): 228 Reference Range: 80 - 120 mg / dl Notes patients current CBG (with personal monitor) was 228 Electronic Signature(s) Signed: 03/13/2019 5:20:11 PM By: Kela Millin Entered By: Kela Millin on 03/13/2019 12:57:30

## 2019-03-18 DIAGNOSIS — M4804 Spinal stenosis, thoracic region: Secondary | ICD-10-CM | POA: Diagnosis not present

## 2019-03-20 ENCOUNTER — Other Ambulatory Visit: Payer: Self-pay

## 2019-03-20 ENCOUNTER — Encounter (HOSPITAL_BASED_OUTPATIENT_CLINIC_OR_DEPARTMENT_OTHER): Payer: PPO | Attending: Internal Medicine | Admitting: Internal Medicine

## 2019-03-20 DIAGNOSIS — T8131XA Disruption of external operation (surgical) wound, not elsewhere classified, initial encounter: Secondary | ICD-10-CM | POA: Diagnosis not present

## 2019-03-20 DIAGNOSIS — L98422 Non-pressure chronic ulcer of back with fat layer exposed: Secondary | ICD-10-CM | POA: Diagnosis not present

## 2019-03-20 DIAGNOSIS — E11622 Type 2 diabetes mellitus with other skin ulcer: Secondary | ICD-10-CM | POA: Diagnosis not present

## 2019-03-20 DIAGNOSIS — M797 Fibromyalgia: Secondary | ICD-10-CM | POA: Diagnosis not present

## 2019-03-20 DIAGNOSIS — E1151 Type 2 diabetes mellitus with diabetic peripheral angiopathy without gangrene: Secondary | ICD-10-CM | POA: Insufficient documentation

## 2019-03-20 DIAGNOSIS — E1142 Type 2 diabetes mellitus with diabetic polyneuropathy: Secondary | ICD-10-CM | POA: Insufficient documentation

## 2019-03-20 DIAGNOSIS — X58XXXD Exposure to other specified factors, subsequent encounter: Secondary | ICD-10-CM | POA: Insufficient documentation

## 2019-03-20 DIAGNOSIS — I499 Cardiac arrhythmia, unspecified: Secondary | ICD-10-CM | POA: Insufficient documentation

## 2019-03-20 DIAGNOSIS — M4804 Spinal stenosis, thoracic region: Secondary | ICD-10-CM | POA: Diagnosis not present

## 2019-03-20 DIAGNOSIS — I10 Essential (primary) hypertension: Secondary | ICD-10-CM | POA: Diagnosis not present

## 2019-03-20 DIAGNOSIS — T8131XD Disruption of external operation (surgical) wound, not elsewhere classified, subsequent encounter: Secondary | ICD-10-CM | POA: Diagnosis not present

## 2019-03-20 NOTE — Progress Notes (Signed)
[name], [name] ([id number]) Visit Report for 03/20/2019 HPI Details Patient Name: Date of Service: MAAT, BAUGUESS 03/20/2019 9:15 AM Medical Record [id number] Patient Account Number: [id number] Date of Birth/Sex: Treating RN: 03-22-1940 (79 y.o. F) Primary Care Provider: [name], Betty Other Clinician: Referring Provider: Treating Provider/Extender:Karanvir Balderston [name], Cala Bradford in Treatment: 2 History of Present Illness HPI Description: ADMISSION 03/06/2019 This is a patient with a history of severe thoracic myelopathy admitted to hospital from 01/08/2019 through 01/10/2019 for surgical decompression decompression with fusion at T10-T11. Surgery was done by Dr. [name]. According to her daughter who is a nurse who accompanied her the wound dehisced about 2 weeks later. Initially they were using Betadine but at the suggestion of Dr. Sharol Given who the daughter knows they switched to Iodosorb ointment and this really has done a nice job cleaning up the wound surface by review of pictures on the daughter's phone Past medical history type 2 diabetes with peripheral neuropathy, paroxysmal atrial tachycardia, history of pancreatitis, history of fibromyalgia, vertigo and generalized arthritis as well as severe degenerative disc disease at the C and T-spine levels as well as L-spine 1/28; patient admitted to the clinic last week. She has 2 small wounds in the middle of a thoracic decompression surgical scar. The superior one is almost closed however the longer one in the middle part of the surgical scar is only 50% epithelialized the rest of the 50% has had very adherent fibrinous debris. They already have Iodosorb that was prescribed by Dr. Sharol Given therefore will change to this on this area. Santyl would certainly be an option 2/4; the superior wound in this patient's surgical wound on her thoracic back is closed. The longer area below this I would say is about 50% of the length there is still  has some relative depth here. We switch back to Iodoflex last week. This seems to be doing the job. There is no need to change the dressing Electronic Signature(s) Signed: 03/20/2019 5:50:02 PM By: Linton Ham MD Entered By: Linton Ham on 03/20/2019 10:18:26 -------------------------------------------------------------------------------- Physical Exam Details Patient Name: Date of Service: CAMYIAH, HEIM 03/20/2019 9:15 AM Medical Record [id number] Patient Account Number: [id number] Date of Birth/Sex: Treating RN: 05/28/1940 (79 y.o. F) Primary Care Provider: [name], Betty Other Clinician: Referring Provider: Treating Provider/Extender:Ronan Dion [name], Cala Bradford in Treatment: 2 Constitutional Patient is hypertensive.. Pulse regular and within target range for patient.Marland Kitchen Respirations regular, non-labored and within target range.. Temperature is normal and within the target range for the patient.Marland Kitchen Appears in no distress. Notes Wound exam; the area in question is on the T10-T11 area. Originally there were 2 open wounds here the superior smaller area has closed. Had a linear area with some relative depth below this. I would say this is down to 50% of the original length. There is still some depth to what remains and perhaps some debris of the surface although this would be almost impossible to debride at this point. There is no surrounding erythema Electronic Signature(s) Signed: 03/20/2019 5:50:02 PM By: Linton Ham MD Entered By: Linton Ham on 03/20/2019 10:19:27 -------------------------------------------------------------------------------- Physician Orders Details Patient Name: Date of Service: KORIANN, GERRITY 03/20/2019 9:15 AM Medical Record [id number] Patient Account Number: [id number] Date of Birth/Sex: Treating RN: Jun 29, 1940 (79 y.o. 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 - Thursday Dressing Change Frequency Change Dressing every other day. Skin Barriers/Peri-Wound Care Skin Prep Wound Cleansing May shower and wash wound with soap and water. - with dressing changes. Primary Wound Dressing Wound #1 Distal,Midline Back Iodosorb Ointment - ensure to apply into wound bed. Secondary Dressing Wound #1 Distal,Midline Back Foam Border - or bordered gauze. Off-Loading Turn and reposition every 2 hours Electronic Signature(s) Signed: 03/20/2019 5:50:02 PM By: Linton Ham MD Signed: 03/20/2019 5:56:18 PM By: Deon Pilling Entered By: Deon Pilling on 03/20/2019 10:00:03 -------------------------------------------------------------------------------- Problem List Details Patient Name: Date of Service: FREDI, WESTMARK 03/20/2019 9:15 AM Medical Record [id number] Patient Account Number: [id number] Date of Birth/Sex: Treating RN: 09-Jun-1940 (79 y.o. Debby Bud Primary Care Provider: [name], Betty Other Clinician: Referring Provider: Treating Provider/Extender:Jairo Bellew [name], Cala Bradford in Treatment: 2 Active Problems ICD-10 Evaluated Encounter Code Description Active Date Today Diagnosis T81.31XD Disruption of external operation (surgical) wound, not 03/06/2019 No Yes elsewhere classified, subsequent encounter L98.422 Non-pressure chronic ulcer of back with fat layer 03/06/2019 No Yes exposed Inactive Problems Resolved Problems Electronic Signature(s) Signed: 03/20/2019 5:50:02 PM By: Linton Ham MD Entered By: Linton Ham on 03/20/2019  10:17:27 -------------------------------------------------------------------------------- Progress Note Details Patient Name: Date of Service: [name] 03/20/2019 9:15 AM Medical Record [id number] Patient Account Number: [id number] Date of Birth/Sex: Treating RN: 10-01-1940 (79 y.o. F) Primary Care Provider: [name], Betty Other Clinician: Referring Provider: Treating Provider/Extender:Santoria Chason [name], Cala Bradford in Treatment: 2 Subjective History of Present Illness (HPI) ADMISSION 03/06/2019 This is a patient with a history of severe thoracic myelopathy admitted to hospital from 01/08/2019 through 01/10/2019 for surgical decompression decompression with fusion at T10-T11. Surgery was done by Dr. [name]. According to her daughter who is a nurse who accompanied her the wound dehisced about 2 weeks later. Initially they were using Betadine but at the suggestion of Dr. Sharol Given who the daughter knows they switched to Iodosorb ointment and this really has done a nice job cleaning up the wound surface by review of pictures on the daughter's phone Past medical history type 2 diabetes with peripheral neuropathy, paroxysmal atrial tachycardia, history of pancreatitis, history of fibromyalgia, vertigo and generalized arthritis as well as severe degenerative disc disease at the C and T-spine levels as well as L-spine 1/28; patient admitted to the clinic last week. She has 2 small wounds in the middle of a thoracic decompression surgical scar. The superior one is almost closed however the longer one in the middle part of the surgical scar is only 50% epithelialized the rest of the 50% has had very adherent fibrinous debris. They already have Iodosorb that was prescribed by Dr. Sharol Given therefore will change to this on this area. Santyl would certainly be an option 2/4; the superior wound in this patient's surgical wound on her thoracic back is closed. The longer area below this I would  say is about 50% of the length there is still has some relative depth here. We switch back to Iodoflex last week. This seems to be doing the job. There is no need to change the dressing Objective Constitutional Patient is hypertensive.. Pulse regular and within target range for patient.Marland Kitchen Respirations regular, non-labored and within target range.. Temperature is normal and within the target range for the patient.Marland Kitchen Appears in no distress. Vitals Time Taken: 9:33 AM, Height: 61 in, Weight: 200 lbs, BMI: 37.8, Temperature: 98.1 F, Pulse:  75 bpm, Respiratory Rate: 16 breaths/min, Blood Pressure: 180/91 mmHg, Capillary Blood Glucose: 165 mg/dl. General Notes: BP recheck at discharge 121/68 General Notes: Wound exam; the area in question is on the T10-T11 area. Originally there were 2 open wounds here the superior smaller area has closed. Had a linear area with some relative depth below this. I would say this is down to 50% of the original length. There is still some depth to what remains and perhaps some debris of the surface although this would be almost impossible to debride at this point. There is no surrounding erythema Integumentary (Hair, Skin) Wound #1 status is Open. Original cause of wound was Surgical Injury. The wound is located on the [place] Back. The wound measures 2cm length x 0.2cm width x 0.2cm depth; 0.314cm^2 area and 0.063cm^3 volume. There is Fat Layer (Subcutaneous Tissue) Exposed exposed. There is no tunneling or undermining noted. There is a small amount of serous drainage noted. The wound margin is well defined and not attached to the wound base. There is large (67-100%) pink, pale granulation within the wound bed. There is a small (1-33%) amount of necrotic tissue within the wound bed including Adherent Slough. Wound #2 status is Healed - Epithelialized. Original cause of wound was Surgical Injury. The wound is located on the Proximal,Midline Back. The wound measures  0cm length x 0cm width x 0cm depth; 0cm^2 area and 0cm^3 volume. There is no tunneling or undermining noted. There is a none present amount of drainage noted. The wound margin is distinct with the outline attached to the wound base. There is no granulation within the wound bed. There is no necrotic tissue within the wound bed. 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 - Thursday Dressing Change Frequency: Change Dressing every other day. Skin Barriers/Peri-Wound Care: Skin Prep Wound Cleansing: May shower and wash wound with soap and water. - with dressing changes. Primary Wound Dressing: Wound #1 Distal,Midline Back: Iodosorb Ointment - ensure to apply into wound bed. Secondary Dressing: Wound #1 Distal,Midline Back: Foam Border - or bordered gauze. Off-Loading: Turn and reposition every 2 hours 1. We are continuing with the Iodoflex. It appears to be pulling the wound together adequately. 2. The base of the wound does not look particularly vibrant but as long as it is progressing towards closure I will not change the dressing. 3. No evidence of surrounding infection 4. I have asked her to be careful in offloading this area especially at night Electronic Signature(s) Signed: 03/20/2019 5:50:02 PM By: Linton Ham MD Entered By: Linton Ham on 03/20/2019 10:20:18 -------------------------------------------------------------------------------- SuperBill Details Patient Name: Date of Service: SEBRENIA, ARCEMENT 03/20/2019 Medical Record [id number] Patient Account Number: [id number] Date of Birth/Sex: Treating RN: Feb 01, 1941 (79 y.o. 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 Facility Procedures The patient participates with Medicare or their insurance follows the Medicare Facility Guidelines: CPT4 Code Description Modifier Quantity [id number] - WOUND CARE VISIT-LEV 3 EST PT 1 Physician Procedures CPT4: Description Modifier Quantity Code [id number] - WC PHYS LEVEL 3 - EST PT 1 ICD-10 Diagnosis Description T81.31XD Disruption of external operation (surgical)  wound, not elsewhere classified, subsequent encounter L98.422 Non-pressure chronic ulcer  of back with fat layer exposed Electronic Signature(s) Signed: 03/20/2019 5:50:02 PM By: Linton Ham MD Entered By: Linton Ham on 03/20/2019 10:20:36

## 2019-03-25 DIAGNOSIS — M4804 Spinal stenosis, thoracic region: Secondary | ICD-10-CM | POA: Diagnosis not present

## 2019-03-25 NOTE — Progress Notes (Addendum)
MONEY, [name] ([id number]) Visit Report for 03/20/2019 Arrival Information Details Patient Name: Date of Service: [name], [name] 03/20/2019 9:15 AM Medical Record [id number] Patient Account Number: [id number] Date of Birth/Sex: Treating RN: [birth year] (79 y.o. Nancy Fetter Primary Care Masao Junker: [name], Betty Other Clinician: Referring Dawsyn Zurn: Treating Odin Mariani/Extender:Robson, Michael [name], Cala Bradford in Treatment: 2 Visit Information History Since Last Visit Added or deleted any medications: No Patient Arrived: Gilford Rile Any new allergies or adverse reactions: No Arrival Time: 09:31 Had a fall or experienced change in No Accompanied By: caregiver activities of daily living that may affect Transfer Assistance: None risk of falls: Patient Identification Verified: Yes Signs or symptoms of abuse/neglect since last No Secondary Verification Process Yes visito Completed: Hospitalized since last visit: No Patient Has Alerts: Yes Implantable device outside of the clinic excluding No Patient Alerts: patient takes cellular tissue based products placed in the center aspirin since last visit: Has Dressing in Place as Prescribed: Yes Pain Present Now: No Electronic Signature(s) Signed: 03/25/2019 5:30:42 PM By: Levan Hurst RN, BSN Entered By: Levan Hurst on 03/20/2019 09:32:05 -------------------------------------------------------------------------------- Clinic Level of Care Assessment Details Patient Name: Date of Service: [name] 03/20/2019 9:15 AM Medical Record [id number] Patient Account Number: [id number] Date of Birth/Sex: Treating RN: [birth year] (79 y.o. Helene Shoe, Tammi Klippel Primary Care Myrka Sylva: [name], Betty Other Clinician: Referring Bert Givans: Treating Briana Newman/Extender:Robson, Michael [name], Cala Bradford in Treatment: 2 Clinic Level of Care Assessment Items TOOL 4 Quantity Score X - Use when only an EandM is performed on FOLLOW-UP  visit 1 0 ASSESSMENTS - Nursing Assessment / Reassessment X - Reassessment of Co-morbidities (includes updates in patient status) 1 10 X - Reassessment of Adherence to Treatment Plan 1 5 ASSESSMENTS - Wound and Skin Assessment / Reassessment X - Simple Wound Assessment / Reassessment - one wound 1 5 []  - Complex Wound Assessment / Reassessment - multiple wounds 0 X - Dermatologic / Skin Assessment (not related to wound area) 1 10 ASSESSMENTS - Focused Assessment []  - Circumferential Edema Measurements - multi extremities 0 X - Nutritional Assessment / Counseling / Intervention 1 10 []  - Lower Extremity Assessment (monofilament, tuning fork, pulses) 0 []  - Peripheral Arterial Disease Assessment (using hand held doppler) 0 ASSESSMENTS - Ostomy and/or Continence Assessment and Care []  - Incontinence Assessment and Management 0 []  - Ostomy Care Assessment and Management (repouching, etc.) 0 PROCESS - Coordination of Care X - Simple Patient / Family Education for ongoing care 1 15 []  - Complex (extensive) Patient / Family Education for ongoing care 0 X - Staff obtains Programmer, systems, Records, Test Results / Process Orders 1 10 []  - Staff telephones HHA, Nursing Homes / Clarify orders / etc 0 []  - Routine Transfer to another Facility (non-emergent condition) 0 []  - Routine Hospital Admission (non-emergent condition) 0 []  - New Admissions / Biomedical engineer / Ordering NPWT, Apligraf, etc. 0 []  - Emergency Hospital Admission (emergent condition) 0 X - Simple Discharge Coordination 1 10 []  - Complex (extensive) Discharge Coordination 0 PROCESS - Special Needs []  - Pediatric / Minor Patient Management 0 []  - Isolation Patient Management 0 []  - Hearing / Language / Visual special needs 0 []  - Assessment of Community assistance (transportation, D/C planning, etc.) 0 []  - Additional assistance / Altered mentation 0 []  - Support Surface(s) Assessment (bed, cushion, seat, etc.) 0 INTERVENTIONS -  Wound Cleansing / Measurement X - Simple Wound Cleansing - one wound 1 5 []  - Complex Wound Cleansing - multiple wounds 0 X -  Wound Imaging (photographs - any number of wounds) 1 5 []  - Wound Tracing (instead of photographs) 0 X - Simple Wound Measurement - one wound 1 5 []  - Complex Wound Measurement - multiple wounds 0 INTERVENTIONS - Wound Dressings X - Small Wound Dressing one or multiple wounds 1 10 []  - Medium Wound Dressing one or multiple wounds 0 []  - Large Wound Dressing one or multiple wounds 0 []  - Application of Medications - topical 0 []  - Application of Medications - injection 0 INTERVENTIONS - Miscellaneous []  - External ear exam 0 []  - Specimen Collection (cultures, biopsies, blood, body fluids, etc.) 0 []  - Specimen(s) / Culture(s) sent or taken to Lab for analysis 0 []  - Patient Transfer (multiple staff / Civil Service fast streamer / Similar devices) 0 []  - Simple Staple / Suture removal (25 or less) 0 []  - Complex Staple / Suture removal (26 or more) 0 []  - Hypo / Hyperglycemic Management (close monitor of Blood Glucose) 0 []  - Ankle / Brachial Index (ABI) - do not check if billed separately 0 X - Vital Signs 1 5 Has the patient been seen at the hospital within the last three years: Yes Total Score: 105 Level Of Care: New/Established - Level 3 Electronic Signature(s) Signed: 03/20/2019 5:56:18 PM By: Deon Pilling Entered By: Deon Pilling on 03/20/2019 10:00:37 -------------------------------------------------------------------------------- Encounter Discharge Information Details Patient Name: Date of Service: [name] 03/20/2019 9:15 AM Medical Record [id number] Patient Account Number: [id number] Date of Birth/Sex: Treating RN: 07/01/1940 (79 y.o. Nancy Fetter Primary Care Lashae Wollenberg: [name], Betty Other Clinician: Referring Montray Kliebert: Treating Tammala Weider/Extender:Robson, Michael [name], Cala Bradford in Treatment: 2 Encounter Discharge Information  Items Discharge Condition: Stable Ambulatory Status: Walker Discharge Destination: Home Transportation: Private Auto Accompanied By: caregiver Schedule Follow-up Appointment: Yes Clinical Summary of Care: Patient Declined Notes BP recheck 121/68 Electronic Signature(s) Signed: 03/25/2019 5:30:42 PM By: Levan Hurst RN, BSN Entered By: Levan Hurst on 03/20/2019 10:17:47 -------------------------------------------------------------------------------- Multi Wound Chart Details Patient Name: Date of Service: [name] 03/20/2019 9:15 AM Medical Record [id number] Patient Account Number: [id number] Date of Birth/Sex: Treating RN: Jul 29, 1940 (78 y.o. F) Primary Care Cerrone Debold: [name], Betty Other Clinician: Referring Jamicheal Heard: Treating Vianne Grieshop/Extender:Robson, Michael [name], Cala Bradford in Treatment: 2 Vital Signs Height(in): 61 Capillary Blood 165 Glucose(mg/dl): Weight(lbs): 200 Pulse(bpm): 75 Body Mass Index(BMI): 75 Blood Pressure(mmHg): 180/91 Temperature(F): 98.1 Respiratory 16 Rate(breaths/min): Photos: [1:No Photos] [2:No Photos] [N/A:N/A] Wound Location: [1:Back - Midline, Distal] [2:Back - Midline, Proximal] [N/A:N/A] Wounding Event: [1:Surgical Injury] [2:Surgical Injury] [N/A:N/A] Primary Etiology: [1:Open Surgical Wound] [2:Open Surgical Wound] [N/A:N/A] Comorbid History: [1:Arrhythmia, Hypertension, Type II Diabetes] [2:Arrhythmia, Hypertension, Type II Diabetes] [N/A:N/A] Date Acquired: [1:01/08/2019] [2:01/08/2019] [N/A:N/A] Weeks of Treatment: [1:2] [2:2] [N/A:N/A] Wound Status: [1:Open] [2:Healed - Epithelialized] [N/A:N/A] Measurements L x W x D 2x0.2x0.2 [2:0x0x0] [N/A:N/A] (cm) Area (cm) : [1:0.314] [2:0] [N/A:N/A] Volume (cm) : [1:0.063] [2:0] [N/A:N/A] % Reduction in Area: [1:73.30%] [2:100.00%] [N/A:N/A] % Reduction in Volume: 82.20% [2:100.00%] [N/A:N/A] Classification: [1:Full Thickness Without Exposed Support Structures Exposed  Support Structures] [2:Full Thickness Without] [N/A:N/A] Exudate Amount: [1:Small] [2:None Present] [N/A:N/A] Exudate Type: [1:Serous] [2:N/A] [N/A:N/A] Exudate Color: [1:amber] [2:N/A] [N/A:N/A] Wound Margin: [1:Well defined, not attached Distinct, outline attached N/A] Granulation Amount: [1:Large (67-100%)] [2:None Present (0%)] [N/A:N/A] Granulation Quality: [1:Pink, Pale] [2:N/A] [N/A:N/A] Necrotic Amount: [1:Small (1-33%)] [2:None Present (0%)] [N/A:N/A] Exposed Structures: [1:Fat Layer (Subcutaneous Fascia: No Tissue) Exposed: Yes Fascia: No Tendon: No Muscle: No Joint: No Bone: No Medium (34-66%)] [2:Fat Layer (Subcutaneous Tissue) Exposed: No Tendon: No  Muscle: No Joint: No Bone: No Large (67-100%)] [N/A:N/A N/A] Treatment Notes Wound #1 (Distal, Midline Back) 1. Cleanse With Wound Cleanser 2. Periwound Care Skin Prep 3. Primary Dressing Applied Iodosorb 4. Secondary Dressing Foam Border Dressing Electronic Signature(s) Signed: 03/20/2019 5:50:02 PM By: Linton Ham MD Entered By: Linton Ham on 03/20/2019 10:17:32 -------------------------------------------------------------------------------- [place] Details Patient Name: Date of Service: ADAYSIA, FAIDLEY 03/20/2019 9:15 AM Medical Record [id number] Patient Account Number: [id number] Date of Birth/Sex: Treating RN: Aug 17, 1940 (79 y.o. Debby Bud Primary Care Marquitta Persichetti: [name], Betty Other Clinician: Referring Emiliya Chretien: Treating Breasia Karges/Extender:Robson, Michael [name], Cala Bradford in Treatment: 2 Active Inactive Electronic Signature(s) Signed: 04/07/2019 9:15:41 AM By: Deon Pilling Previous Signature: 03/20/2019 5:56:18 PM Version By: Deon Pilling Entered By: Deon Pilling on 04/07/2019 09:15:41 -------------------------------------------------------------------------------- Pain Assessment Details Patient Name: Date of Service: RAELYNNE, FADEL 03/20/2019 9:15 AM Medical Record  [id number] Patient Account Number: [id number] Date of Birth/Sex: Treating RN: August 10, 1940 (79 y.o. Nancy Fetter Primary Care Leontyne Manville: [name], Betty Other Clinician: Referring Kaden Dunkel: Treating Elaine Roanhorse/Extender:Robson, Michael [name], Cala Bradford in Treatment: 2 Active Problems Location of Pain Severity and Description of Pain Patient Has Paino No Site Locations Pain Management and Medication Current Pain Management: Electronic Signature(s) Signed: 03/25/2019 5:30:42 PM By: Levan Hurst RN, BSN Entered By: Levan Hurst on 03/20/2019 09:40:26 -------------------------------------------------------------------------------- Patient/Caregiver Education Details Patient Name: Date of Service: EUNIE, YAKE 2/4/2021andnbsp9:15 AM Medical Record [id number] Patient Account Number: [id number] Date of Birth/Gender: Treating RN: 08-Jun-1940 (78 y.o. Debby Bud Primary Care Physician: [name], Betty Other Clinician: Referring Physician: Treating Physician/Extender:Robson, Michael [name], Cala Bradford in Treatment: 2 Education Assessment Education Provided To: Patient and Caregiver Education Topics Provided Pain: Handouts: A Guide to Pain Control Methods: Explain/Verbal Responses: Reinforcements needed Electronic Signature(s) Signed: 03/20/2019 5:56:18 PM By: Deon Pilling Entered By: Deon Pilling on 03/20/2019 09:42:03 -------------------------------------------------------------------------------- Wound Assessment Details Patient Name: Date of Service: DONNAMARIA, KOFFLER 03/20/2019 9:15 AM Medical Record [id number] Patient Account Number: [id number] Date of Birth/Sex: Treating RN: [birth year] (78 y.o. F) Primary Care Genevia Bouldin: [name], Betty Other Clinician: Referring Channah Godeaux: Treating Vidit Boissonneault/Extender:Robson, Michael [name], Cala Bradford in Treatment: 2 Wound Status Wound Number: 1 Primary Open Surgical Wound Etiology: Wound Location: Back -  Midline, Distal Wound Status: Open Wounding Event: Surgical Injury Comorbid Arrhythmia, Hypertension, Type II Date Acquired: 01/08/2019 History: Diabetes Weeks Of Treatment: 2 Clustered Wound: No Photos Wound Measurements Length: (cm) 2 % Reduct Width: (cm) 0.2 % Reduct Depth: (cm) 0.2 Epitheli Area: (cm) 0.314 Tunneli Volume: (cm) 0.063 Undermi Wound Description Classification: Full Thickness Without Exposed Support Foul Odo Structures Slough/F Wound Well defined, not attached Margin: Exudate Small Amount: Exudate Serous Type: Exudate amber Color: Wound Bed Granulation Amount: Large (67-100%) Granulation Quality: Pink, Pale Fascia E Necrotic Amount: Small (1-33%) Fat Laye Necrotic Quality: Adherent Slough Tendon E Muscle E Joint Ex Bone Exp r After Cleansing: No ibrino Yes Exposed Structure xposed: No r (Subcutaneous Tissue) Exposed: Yes xposed: No xposed: No posed: No osed: No ion in Area: 73.3% ion in Volume: 82.2% alization: Medium (34-66%) ng: No ning: No Electronic Signature(s) Signed: 03/24/2019 4:47:04 PM By: Mikeal Hawthorne EMT/HBOT Entered By: Mikeal Hawthorne on 03/24/2019 15:07:53 -------------------------------------------------------------------------------- Wound Assessment Details Patient Name: Date of Service: SOVEREIGN, BRANSTETTER 03/20/2019 9:15 AM Medical Record [id number] Patient Account Number: [id number] Date of Birth/Sex: Treating RN: [birth year] (79 y.o. F) Primary Care Haddon Fyfe: [name], Betty Other Clinician: Referring Montoya Watkin: Treating Woodruff Skirvin/Extender:Robson, Michael [name], Cala Bradford in Treatment: 2 Wound Status Wound Number: 2 Primary Open Surgical  Wound Etiology: Wound Location: Back - Midline, Proximal Wound Status: Healed - Epithelialized Wounding Event: Surgical Injury Comorbid Arrhythmia, Hypertension, Type II Date Acquired: 01/08/2019 History: Diabetes Weeks Of Treatment: 2 Clustered Wound:  No Photos Wound Measurements Length: (cm) 0 % Reduct Width: (cm) 0 % Reduct Depth: (cm) 0 Epitheli Area: (cm) 0 Tunneli Volume: (cm) 0 Undermi Wound Description Classification: Full Thickness Without Exposed Support Foul Odo Structures Slough/F Wound Distinct, outline attached Margin: Exudate None Present Amount: Wound Bed Granulation Amount: None Present (0%) Necrotic Amount: None Present (0%) Fascia E Fat Laye Tendon E Muscle E Joint Ex Bone Exp r After Cleansing: No ibrino No Exposed Structure xposed: No r (Subcutaneous Tissue) Exposed: No xposed: No xposed: No posed: No osed: No ion in Area: 100% ion in Volume: 100% alization: Large (67-100%) ng: No ning: No Electronic Signature(s) Signed: 03/24/2019 4:47:04 PM By: Mikeal Hawthorne EMT/HBOT Entered By: Mikeal Hawthorne on 03/24/2019 15:10:28 -------------------------------------------------------------------------------- Vitals Details Patient Name: Date of Service: DOWNSWallene Dales. 03/20/2019 9:15 AM Medical Record [id number] Patient Account Number: [id number] Date of Birth/Sex: Treating RN: 04/19/1940 (79 y.o. Nancy Fetter Primary Care Shaughnessy Gethers: [name], Betty Other Clinician: Referring Lilliana Turner: Treating Kalev Temme/Extender:Robson, Michael [name], Cala Bradford in Treatment: 2 Vital Signs Time Taken: 09:33 Temperature (F): 98.1 Height (in): 61 Pulse (bpm): 75 Weight (lbs): 200 Respiratory Rate (breaths/min): 16 Body Mass Index (BMI): 37.8 Blood Pressure (mmHg): 180/91 Capillary Blood Glucose (mg/dl): 165 Reference Range: 80 - 120 mg / dl Notes BP recheck at discharge 121/68 Electronic Signature(s) Signed: 03/25/2019 5:30:42 PM By: Levan Hurst RN, BSN Entered By: Levan Hurst on 03/20/2019 10:18:07

## 2019-03-26 ENCOUNTER — Other Ambulatory Visit: Payer: Self-pay | Admitting: Family Medicine

## 2019-03-26 DIAGNOSIS — G2581 Restless legs syndrome: Secondary | ICD-10-CM

## 2019-03-27 DIAGNOSIS — X32XXXS Exposure to sunlight, sequela: Secondary | ICD-10-CM | POA: Diagnosis not present

## 2019-03-27 DIAGNOSIS — L820 Inflamed seborrheic keratosis: Secondary | ICD-10-CM | POA: Diagnosis not present

## 2019-03-27 DIAGNOSIS — L821 Other seborrheic keratosis: Secondary | ICD-10-CM | POA: Diagnosis not present

## 2019-03-27 DIAGNOSIS — D2271 Melanocytic nevi of right lower limb, including hip: Secondary | ICD-10-CM | POA: Diagnosis not present

## 2019-03-27 DIAGNOSIS — L814 Other melanin hyperpigmentation: Secondary | ICD-10-CM | POA: Diagnosis not present

## 2019-03-30 ENCOUNTER — Ambulatory Visit: Payer: PPO

## 2019-04-01 DIAGNOSIS — M4804 Spinal stenosis, thoracic region: Secondary | ICD-10-CM | POA: Diagnosis not present

## 2019-04-03 ENCOUNTER — Encounter (HOSPITAL_BASED_OUTPATIENT_CLINIC_OR_DEPARTMENT_OTHER): Payer: PPO | Admitting: Internal Medicine

## 2019-04-03 DIAGNOSIS — E1149 Type 2 diabetes mellitus with other diabetic neurological complication: Secondary | ICD-10-CM | POA: Diagnosis not present

## 2019-04-04 DIAGNOSIS — M4804 Spinal stenosis, thoracic region: Secondary | ICD-10-CM | POA: Diagnosis not present

## 2019-04-07 DIAGNOSIS — M4804 Spinal stenosis, thoracic region: Secondary | ICD-10-CM | POA: Diagnosis not present

## 2019-04-08 ENCOUNTER — Encounter: Payer: Self-pay | Admitting: Physical Medicine and Rehabilitation

## 2019-04-08 ENCOUNTER — Encounter: Payer: PPO | Attending: Physical Medicine and Rehabilitation | Admitting: Physical Medicine and Rehabilitation

## 2019-04-08 ENCOUNTER — Other Ambulatory Visit: Payer: Self-pay

## 2019-04-08 ENCOUNTER — Encounter: Payer: PPO | Admitting: Physical Medicine & Rehabilitation

## 2019-04-08 VITALS — BP 161/82 | HR 71 | Temp 97.5°F | Ht 61.0 in | Wt 201.0 lb

## 2019-04-08 DIAGNOSIS — R252 Cramp and spasm: Secondary | ICD-10-CM | POA: Diagnosis not present

## 2019-04-08 DIAGNOSIS — M069 Rheumatoid arthritis, unspecified: Secondary | ICD-10-CM | POA: Insufficient documentation

## 2019-04-08 DIAGNOSIS — M4712 Other spondylosis with myelopathy, cervical region: Secondary | ICD-10-CM | POA: Insufficient documentation

## 2019-04-08 NOTE — Telephone Encounter (Signed)
Message from patient

## 2019-04-08 NOTE — Progress Notes (Signed)
Subjective:    Patient ID: [name], female    DOB: [birth year], 79 y.o.   MRN: [id number]  HPI  [name] is a 79 year old woman who presents for evaluation of right hand flexor spasticity following a fall resulting in spinal cord damage. Her daughter accompanies her and helps to provide history.   She has a history of severe cervical, thoracic, and lumbar spinal stenosis s/p two ACDFs and thoracic decompression, fusion, and posterior lateral arthrodesis with pedicle screws.   She has been having severe right sided flexor spasticity since then causing her fingers to curve inward to her palms. She has been using hand splints and exercising her hands to keep them mobile. This is very disruptive to her as she would love to be able to return to cooking and knitting, activities that she loves.  She does not have other areas of spasticity. Her land hand is weak but not spastic with the exception of one of her fingers.   She ambulates using a walker and has had no recent falls. She is currently receiving PT for her right handed tone.   She also has a history of rheumatology and fibromyalgia but does not follow with a rheumatologist. She had one 10 years ago. She has had no recent rheumatologic labs.    Pain Inventory Average Pain 6 Pain Right Now 6 My pain is constant, sharp, burning, dull, stabbing, tingling and aching  In the last 24 hours, has pain interfered with the following? General activity 10 Relation with others 8 Enjoyment of life 10 What TIME of day is your pain at its worst? all Sleep (in general) Good  Pain is worse with: bending and inactivity Pain improves with: medication Relief from Meds: 2  Mobility walk with assistance use a walker how many minutes can you walk? 5 ability to climb steps?  no do you drive?  no Do you have any goals in this area?  yes  Function retired I need assistance with the following:  dressing, bathing, meal prep, household duties  and shopping  Neuro/Psych weakness numbness tremor tingling depression anxiety  Prior Studies new  Physicians involved in your care new   Family History  Problem Relation Age of Onset  . Heart failure Mother   . Hyperlipidemia Mother   . Lung cancer Father        Lymphoma  . Hyperlipidemia Maternal Grandmother   . Peripheral vascular disease Maternal Grandmother    Social History   Socioeconomic History  . Marital status: Widowed    Spouse name: Glendell Docker  . Number of children: 2  . Years of education: Not on file  . Highest education level: Not on file  Occupational History  . Not on file  Tobacco Use  . Smoking status: Former Research scientist (life sciences)  . Smokeless tobacco: Never Used  . Tobacco comment: smoke 1 cigarette every few weeks when the [place] lady came by.  Substance and Sexual Activity  . Alcohol use: No  . Drug use: No  . Sexual activity: Not on file  Other Topics Concern  . Not on file  Social History Narrative   Lives home alone.  Daughter [name] with her today.   Independent of ADLs.  Education HS.  Widowed.     Social Determinants of Health   Financial Resource Strain:   . Difficulty of Paying Living Expenses: Not on file  Food Insecurity:   . Worried About Charity fundraiser in the Last Year: Not on  file  . [place] in the Last Year: Not on file  Transportation Needs:   . Lack of Transportation (Medical): Not on file  . Lack of Transportation (Non-Medical): Not on file  Physical Activity:   . Days of Exercise per Week: Not on file  . Minutes of Exercise per Session: Not on file  Stress:   . Feeling of Stress : Not on file  Social Connections:   . Frequency of Communication with Friends and Family: Not on file  . Frequency of Social Gatherings with Friends and Family: Not on file  . Attends Religious Services: Not on file  . Active Member of Clubs or Organizations: Not on file  . Attends Archivist Meetings: Not on file  . Marital  Status: Not on file   Past Surgical History:  Procedure Laterality Date  . ABDOMINAL HYSTERECTOMY    . ANKLE SURGERY Left    x 2  . ANTERIOR CERVICAL DECOMP/DISCECTOMY FUSION N/A 11/12/2017   Procedure: Cervical seven to Thoracic one  Anterior cervical decompression/discectomy/fusion with removal of old plate;  Surgeon: [name], MD;  Location: [facility];  Service: Neurosurgery;  Laterality: N/A;  . BACK SURGERY     T12-L1 fusion, Anterior Cervical fusion  . CARDIAC CATHETERIZATION N/A 02/16/2015   Procedure: Left Heart Cath and Coronary Angiography;  Surgeon: Sherren Mocha, MD;  Location: [facility];  Service: Cardiovascular;  Laterality: N/A;  . CHOLECYSTECTOMY    . COLONOSCOPY    . ESOPHAGOGASTRODUODENOSCOPY  12/21/2010   Procedure: ESOPHAGOGASTRODUODENOSCOPY (EGD);  Surgeon: Landry Dyke, MD;  Location: [facility];  Service: Endoscopy;  Laterality: N/A;  . EXCISION MORTON'S NEUROMA Right   . EYE SURGERY Bilateral    Cataract  . FRACTURE SURGERY     ankle x 2   . KNEE SURGERY Right    x 3- arthroscopy  . NECK SURGERY    . POSTERIOR CERVICAL FUSION/FORAMINOTOMY N/A 01/22/2018   Procedure: Cervical six-seven Posterior Decompression with Posterior Fixation Cervical Five to Thoracic Two;  Surgeon: [name], MD;  Location: [facility];  Service: Neurosurgery;  Laterality: N/A;  Cervical 6-7 Posteror decompression with posterior fixation Cervical 5 to Thoracic 2  . ROTATOR CUFF REPAIR Right   . TONSILLECTOMY    . TOTAL KNEE ARTHROPLASTY Left 04/10/2017   Procedure: LEFT TOTAL KNEE ARTHROPLASTY;  Surgeon: Paralee Cancel, MD;  Location: [facility];  Service: Orthopedics;  Laterality: Left;  70 mins  . UPPER GASTROINTESTINAL ENDOSCOPY    . WRIST SURGERY Left    fracture   Past Medical History:  Diagnosis Date  . Anemia    during 1st pregnancy  . Anxiety   . Arthritis   . Cancer ([place])    Basal - face  . Complication of anesthesia   . Depression   . Diarrhea, functional   .  Dysrhythmia    Paroysmal Atrial Tachycardia  . Essential hypertension   . Fibromyalgia   . GERD (gastroesophageal reflux disease)   . H/O acute pancreatitis   . Headache   . History of hiatal hernia   . Hyperlipemia   . Neuropathy   . Osteoporosis   . Panic attacks   . Paralysis ([place]) 01/06/2019   hands  . PAT (paroxysmal atrial tachycardia) ([place])   . Pneumonia   . PONV (postoperative nausea and vomiting)   . PSVT (paroxysmal supraventricular tachycardia) ([place])   . Reflux   . Restless leg syndrome   . Spinal cord compression (  [place])   . Type 2 diabetes mellitus ([place])    Type II  . Vertigo    not current   BP (!) 161/82   Pulse 71   Temp (!) 97.5 F (36.4 C)   Ht 5' 1"  (1.549 m)   Wt 201 lb (91.2 kg)   SpO2 97%   BMI 37.98 kg/m   Opioid Risk Score:   Fall Risk Score:  `1  Depression screen PHQ 2/9  Depression screen [facility] 2/9 09/28/2018 08/23/2018 10/19/2017 09/05/2017 03/27/2016 03/16/2016  Decreased Interest 0 0 1 0 0 0  Down, Depressed, Hopeless 1 0 2 0 0 0  PHQ - 2 Score 1 0 3 0 0 0  Altered sleeping - - 2 - - -  Tired, decreased energy - - 3 - - -  Change in appetite - - 3 - - -  Feeling bad or failure about yourself  - - 3 - - -  Trouble concentrating - - 2 - - -  Moving slowly or fidgety/restless - - 1 - - -  Suicidal thoughts - - 3 - - -  PHQ-9 Score - - 20 - - -  Difficult doing work/chores - - Not difficult at all - - -  Some recent data might be hidden    Review of Systems  Constitutional: Positive for unexpected weight change.  HENT: Negative.   Eyes: Negative.   Respiratory: Negative.   Cardiovascular: Negative.   Gastrointestinal: Negative.   Endocrine: Negative.   Genitourinary: Negative.   Musculoskeletal: Positive for gait problem.       Spasms   Neurological: Positive for tremors, weakness and numbness.       Tingling   Psychiatric/Behavioral: Positive for dysphoric mood. The patient is nervous/anxious.   All other systems reviewed and are  negative.      Objective:   Physical Exam  General: Alert and oriented x 3, No apparent distress HEENT: Head is normocephalic, atraumatic, PERRLA, EOMI, sclera anicteric, oral mucosa pink and moist, dentition intact, ext ear canals clear,  Neck: Supple without JVD or lymphadenopathy Heart: Reg rate and rhythm. No murmurs rubs or gallops Chest: CTA bilaterally without wheezes, rales, or rhonchi; no distress Abdomen: Soft, non-tender, non-distended, bowel sounds positive. Extremities: No clubbing, cyanosis, or edema. Pulses are 2+ Skin: Clean and intact without signs of breakdown Neuro/MSK: Pt is cognitively appropriate with normal insight, memory, and awareness. Cranial nerves 2-12 are intact. Sensory exam is normal. MAS 3 in right hand finger flexors. Rheumatic changes along right hand MCPs.   Psych: Pt's affect is appropriate. Pt is cooperative.      Assessment & Plan:  Mrs. Culliton is a 79 year old woman who presents for evaluation of right hand flexor spasticity following a fall resulting in spinal cord damage.   -Will apply for insurance authorization for 100U of Botox to be administered as follows to the right arm: 35T to flexor pollicis longus 61W to flexor carpi radialis 10U to flexor carpi ulnaris 30U to flexor digitorum profundus 30U to flexor digitorum superficialis Discussed potential side effects of Botox such as asthenia. If it causes her to be too weak to grasp her walker, we may have to decrease doses.   -Continue use of splint at night and exercises to maintain range of motion in right hand.  --Ordered ESR, CRP, ANA, RF and will call patient with results tomorrow. If latter two are positive, she should follow with a rheumatologist for her diffuse pains.  --Discussed the option  of Lyrica for fibromyalgia pain. Patient prefers to deter decision until after lab work results.   RTC in 2 weeks for Botox injections.

## 2019-04-09 LAB — SEDIMENTATION RATE: Sed Rate: 5 mm/hr (ref 0–40)

## 2019-04-09 LAB — RHEUMATOID FACTOR: Rheumatoid fact SerPl-aCnc: <10 IU/mL (ref 0.0–13.9)

## 2019-04-09 LAB — ANA: Anti Nuclear Antibody (ANA): NEGATIVE

## 2019-04-10 ENCOUNTER — Encounter (HOSPITAL_BASED_OUTPATIENT_CLINIC_OR_DEPARTMENT_OTHER): Payer: PPO | Admitting: Internal Medicine

## 2019-04-10 DIAGNOSIS — M4804 Spinal stenosis, thoracic region: Secondary | ICD-10-CM | POA: Diagnosis not present

## 2019-04-12 DIAGNOSIS — I110 Hypertensive heart disease with heart failure: Secondary | ICD-10-CM | POA: Diagnosis not present

## 2019-04-12 DIAGNOSIS — G8250 Quadriplegia, unspecified: Secondary | ICD-10-CM | POA: Diagnosis not present

## 2019-04-12 DIAGNOSIS — E1169 Type 2 diabetes mellitus with other specified complication: Secondary | ICD-10-CM | POA: Diagnosis not present

## 2019-04-12 DIAGNOSIS — Z9889 Other specified postprocedural states: Secondary | ICD-10-CM | POA: Diagnosis not present

## 2019-04-12 DIAGNOSIS — M4690 Unspecified inflammatory spondylopathy, site unspecified: Secondary | ICD-10-CM | POA: Diagnosis not present

## 2019-04-12 DIAGNOSIS — I479 Paroxysmal tachycardia, unspecified: Secondary | ICD-10-CM | POA: Diagnosis not present

## 2019-04-12 DIAGNOSIS — Z6839 Body mass index (BMI) 39.0-39.9, adult: Secondary | ICD-10-CM | POA: Diagnosis not present

## 2019-04-12 DIAGNOSIS — I509 Heart failure, unspecified: Secondary | ICD-10-CM | POA: Diagnosis not present

## 2019-04-12 DIAGNOSIS — Z794 Long term (current) use of insulin: Secondary | ICD-10-CM | POA: Diagnosis not present

## 2019-04-12 DIAGNOSIS — Z7722 Contact with and (suspected) exposure to environmental tobacco smoke (acute) (chronic): Secondary | ICD-10-CM | POA: Diagnosis not present

## 2019-04-12 DIAGNOSIS — F3342 Major depressive disorder, recurrent, in full remission: Secondary | ICD-10-CM | POA: Diagnosis not present

## 2019-04-15 DIAGNOSIS — M4804 Spinal stenosis, thoracic region: Secondary | ICD-10-CM | POA: Diagnosis not present

## 2019-04-16 ENCOUNTER — Other Ambulatory Visit: Payer: Self-pay | Admitting: Family Medicine

## 2019-04-16 DIAGNOSIS — R350 Frequency of micturition: Secondary | ICD-10-CM

## 2019-04-17 DIAGNOSIS — M4804 Spinal stenosis, thoracic region: Secondary | ICD-10-CM | POA: Diagnosis not present

## 2019-04-21 DIAGNOSIS — M4804 Spinal stenosis, thoracic region: Secondary | ICD-10-CM | POA: Diagnosis not present

## 2019-04-23 ENCOUNTER — Ambulatory Visit: Payer: PPO | Admitting: Physical Medicine and Rehabilitation

## 2019-04-23 ENCOUNTER — Other Ambulatory Visit: Payer: Self-pay

## 2019-04-23 ENCOUNTER — Encounter: Payer: Self-pay | Admitting: Family Medicine

## 2019-04-23 ENCOUNTER — Ambulatory Visit (INDEPENDENT_AMBULATORY_CARE_PROVIDER_SITE_OTHER): Payer: PPO | Admitting: Family Medicine

## 2019-04-23 VITALS — BP 126/70 | HR 83 | Resp 16 | Ht 61.0 in | Wt 201.1 lb

## 2019-04-23 DIAGNOSIS — H9201 Otalgia, right ear: Secondary | ICD-10-CM

## 2019-04-23 DIAGNOSIS — H6121 Impacted cerumen, right ear: Secondary | ICD-10-CM

## 2019-04-23 MED ORDER — DEBROX 6.5 % OT SOLN: 5.0000 [drp] | Freq: Two times a day (BID) | OTIC | 0 refills | Status: DC

## 2019-04-23 NOTE — Patient Instructions (Signed)
A few things to remember from today's visit:   Hearing loss of right ear due to cerumen impaction   Earwax Buildup, Adult The ears produce a substance called earwax that helps keep bacteria out of the ear and protects the skin in the ear canal. Occasionally, earwax can build up in the ear and cause discomfort or hearing loss. What increases the risk? This condition is more likely to develop in people who:  Are female.  Are elderly.  Naturally produce more earwax.  Clean their ears often with cotton swabs.  Use earplugs often.  Use in-ear headphones often.  Wear hearing aids.  Have narrow ear canals.  Have earwax that is overly thick or sticky.  Have eczema.  Are dehydrated.  Have excess hair in the ear canal. What are the signs or symptoms? Symptoms of this condition include:  Reduced or muffled hearing.  A feeling of fullness in the ear or feeling that the ear is plugged.  Fluid coming from the ear.  Ear pain.  Ear itch.  Ringing in the ear.  Coughing.  An obvious piece of earwax that can be seen inside the ear canal. How is this diagnosed? This condition may be diagnosed based on:  Your symptoms.  Your medical history.  An ear exam. During the exam, your health care provider will look into your ear with an instrument called an otoscope. You may have tests, including a hearing test. How is this treated? This condition may be treated by:  Using ear drops to soften the earwax.  Having the earwax removed by a health care provider. The health care provider may: ? Flush the ear with water. ? Use an instrument that has a loop on the end (curette). ? Use a suction device.  Surgery to remove the wax buildup. This may be done in severe cases. Follow these instructions at home:   Take over-the-counter and prescription medicines only as told by your health care provider.  Do not put any objects, including cotton swabs, into your ear. You can clean the  opening of your ear canal with a washcloth or facial tissue.  Follow instructions from your health care provider about cleaning your ears. Do not over-clean your ears.  Drink enough fluid to keep your urine clear or pale yellow. This will help to thin the earwax.  Keep all follow-up visits as told by your health care provider. If earwax builds up in your ears often or if you use hearing aids, consider seeing your health care provider for routine, preventive ear cleanings. Ask your health care provider how often you should schedule your cleanings.  If you have hearing aids, clean them according to instructions from the manufacturer and your health care provider. Contact a health care provider if:  You have ear pain.  You develop a fever.  You have blood, pus, or other fluid coming from your ear.  You have hearing loss.  You have ringing in your ears that does not go away.  Your symptoms do not improve with treatment.  You feel like the room is spinning (vertigo). Summary  Earwax can build up in the ear and cause discomfort or hearing loss.  The most common symptoms of this condition include reduced or muffled hearing and a feeling of fullness in the ear or feeling that the ear is plugged.  This condition may be diagnosed based on your symptoms, your medical history, and an ear exam.  This condition may be treated by using ear  drops to soften the earwax or by having the earwax removed by a health care provider.  Do not put any objects, including cotton swabs, into your ear. You can clean the opening of your ear canal with a washcloth or facial tissue. This information is not intended to replace advice given to you by your health care provider. Make sure you discuss any questions you have with your health care provider. Document Revised: 01/12/2017 Document Reviewed: 04/12/2016 Elsevier Patient Education  [place].  Please be sure medication list is accurate. If a new  problem present, please set up appointment sooner than planned today.

## 2019-04-23 NOTE — Progress Notes (Signed)
ACUTE VISIT   HPI:  Chief Complaint  Patient presents with  . right ear pain    [name] is a 79 y.o. female, who is here today with her daughter complaining of 4-5 weeks of gradual earache, mild to moderate,no radiated and intermittent. + Intermittent tinnitus,chronic. Not sure about exacerbating or alleviating factors.  A few days ago sudden hearing lose when she was trying to remove cerumen with 5th fingernail, "something move." Right hearing loss is intermittent. Alleviated sometimes by pulling auricle back.  She has not tried OTC medications.  Negative for recent travel or URI. No associated fever,chills,changes in appetite,sore throat,nasal congestion,post nasal drainage. Occasional non productive cough, attributed to dry throat.   Review of Systems  Constitutional: Negative for activity change.  HENT: Negative for facial swelling and mouth sores.   Respiratory: Negative for shortness of breath and wheezing.   Allergic/Immunologic: Positive for environmental allergies.  Neurological: Negative for headaches.  Rest see pertinent positives and negatives per HPI.   Current Outpatient Medications on File Prior to Visit  Medication Sig Dispense Refill  . aspirin EC 81 MG tablet Take 81 mg by mouth at bedtime.     . BD PEN NEEDLE MICRO U/F 32G X 6 MM MISC 4 TIMES A DAY NOVOLOG(3) AND LANTUS9(1) 100 each 6  . Calcium Carb-Cholecalciferol (CALCIUM 600+D3) 600-200 MG-UNIT TABS Take 1 tablet by mouth at bedtime.     . Continuous Blood Gluc Receiver (DEXCOM G6 RECEIVER) DEVI 1 Device by Does not apply route daily. 1 Device 0  . Continuous Blood Gluc Sensor (DEXCOM G6 SENSOR) MISC 1 kit by Does not apply route as directed. 3 each 3  . Continuous Blood Gluc Transmit (DEXCOM G6 TRANSMITTER) MISC 1 kit by Does not apply route as directed. 1 each 3  . Cranberry 450 MG CAPS Take 900 mg by mouth 2 (two) times daily.     . digoxin (LANOXIN) 0.125 MG tablet Take 1  tablet (0.125 mg total) by mouth daily. Patient needs appointment for further refills 90 tablet 3  . docusate sodium (COLACE) 100 MG capsule Take 100 mg by mouth daily. 200 mg every other day with 100 mg in between and 100 mg every evening    . DULoxetine (CYMBALTA) 30 MG capsule Take 1 capsule (30 mg total) by mouth daily. (Patient taking differently: Take 30 mg by mouth at bedtime. ) 90 capsule 2  . ezetimibe (ZETIA) 10 MG tablet TAKE 1 TABLET BY MOUTH EVERY DAY 90 tablet 1  . glucose blood test strip Use as instructed to test blood sugars 3 times daily DX.E11.42 100 each 12  . insulin aspart (NOVOLOG FLEXPEN) 100 UNIT/ML FlexPen Inject 10 Units into the skin daily with breakfast AND 14 Units daily with lunch AND 14 Units daily with supper. (Patient taking differently: Inject 8 Units into the skin daily with breakfast, 10 Units daily with lunch and 10 Units daily with supper.) 15 mL 11  . insulin glargine (LANTUS) 100 UNIT/ML injection Inject 20 Units into the skin daily.     . meloxicam (MOBIC) 15 MG tablet Take 15 mg by mouth daily.     . metoprolol succinate (TOPROL-XL) 25 MG 24 hr tablet Take 1 tablet (25 mg total) by mouth daily. Patient needs to schedule an appointment for an additional refill 90 tablet 3  . Multiple Vitamins-Minerals (PRESERVISION AREDS 2 PO) Take 1 tablet by mouth 2 (two) times daily.     Marland Kitchen MYRBETRIQ  25 MG TB24 tablet TAKE 1 TABLET BY MOUTH EVERY DAY 30 tablet 2  . omeprazole (PRILOSEC) 20 MG capsule Take 1 capsule (20 mg total) by mouth daily. 90 capsule 2  . ramipril (ALTACE) 10 MG capsule Take 1 capsule (10 mg total) by mouth every evening. 90 capsule 2  . RELION INSULIN SYRINGE 1ML/31G 31G X 5/16" 1 ML MISC Use as directed 100 each 1  . rOPINIRole (REQUIP) 1 MG tablet TAKE ONE TABLET BY MOUTH AT BEDTIME. 90 tablet 1  . verapamil (CALAN) 120 MG tablet Take 120 mg by mouth daily as needed (afib).     . verapamil (VERELAN PM) 240 MG 24 hr capsule Take 1 capsule (240 mg  total) by mouth daily. 90 capsule 3  . VITAMIN D PO Take 10,000 Units by mouth daily.      No current facility-administered medications on file prior to visit.     Past Medical History:  Diagnosis Date  . Anemia    during 1st pregnancy  . Anxiety   . Arthritis   . Cancer ([place])    Basal - face  . Complication of anesthesia   . Depression   . Diarrhea, functional   . Dysrhythmia    Paroysmal Atrial Tachycardia  . Essential hypertension   . Fibromyalgia   . GERD (gastroesophageal reflux disease)   . H/O acute pancreatitis   . Headache   . History of hiatal hernia   . Hyperlipemia   . Neuropathy   . Osteoporosis   . Panic attacks   . Paralysis ([place]) 01/06/2019   hands  . PAT (paroxysmal atrial tachycardia) ([place])   . Pneumonia   . PONV (postoperative nausea and vomiting)   . PSVT (paroxysmal supraventricular tachycardia) ([place])   . Reflux   . Restless leg syndrome   . Spinal cord compression ([place])   . Type 2 diabetes mellitus ([facility])    Type II  . Vertigo    not current   Allergies  Allergen Reactions  . Amoxil [Amoxicillin] Swelling and Other (See Comments)    Lips Has patient had a PCN reaction causing immediate rash, facial/tongue/throat swelling, SOB or lightheadedness with hypotension:YES Has patient had a PCN reaction causing severe rash involving mucus membranes or skin necrosis:No Has patient had a PCN reaction that required hospitalization:No Has patient had a PCN reaction occurring within the last 10 years:Yes. If all of the above answers are "NO", then may proceed with Cephalosporin use.   Marland Kitchen Lisinopril Itching and Swelling    SWELLING REACTION UNSPECIFIED   . Chocolate Hives  . Demerol Nausea And Vomiting  . Other Other (See Comments)    Raw food or nuts - GI pain    Social History   Socioeconomic History  . Marital status: Widowed    Spouse name: Glendell Docker  . Number of children: 2  . Years of education: Not on file  . Highest education level: Not on  file  Occupational History  . Not on file  Tobacco Use  . Smoking status: Former Research scientist (life sciences)  . Smokeless tobacco: Never Used  . Tobacco comment: smoke 1 cigarette every few weeks when the [place] lady came by.  Substance and Sexual Activity  . Alcohol use: No  . Drug use: No  . Sexual activity: Not on file  Other Topics Concern  . Not on file  Social History Narrative   Lives home alone.  Daughter [name] with her today.   Independent of ADLs.  Education HS.  Widowed.     Social Determinants of Health   Financial Resource Strain:   . Difficulty of Paying Living Expenses: Not on file  Food Insecurity:   . Worried About Charity fundraiser in the Last Year: Not on file  . Ran Out of Food in the Last Year: Not on file  Transportation Needs:   . Lack of Transportation (Medical): Not on file  . Lack of Transportation (Non-Medical): Not on file  Physical Activity:   . Days of Exercise per Week: Not on file  . Minutes of Exercise per Session: Not on file  Stress:   . Feeling of Stress : Not on file  Social Connections:   . Frequency of Communication with Friends and Family: Not on file  . Frequency of Social Gatherings with Friends and Family: Not on file  . Attends Religious Services: Not on file  . Active Member of Clubs or Organizations: Not on file  . Attends Archivist Meetings: Not on file  . Marital Status: Not on file    Vitals:   04/23/19 1117  BP: 126/70  Pulse: 83  Resp: 16  SpO2: 95%   Body mass index is 38 kg/m.  Physical Exam  Constitutional: She appears well-developed. She does not appear ill. No distress.  HENT:  Head: Normocephalic and atraumatic.  Right Ear: External ear normal.  Left Ear: Tympanic membrane and external ear normal.  Mouth/Throat: Mucous membranes are dry.  Right cerumen impaction. Left cerumen excess, TM seen partially.  Eyes: Conjunctivae are normal.  Cardiovascular: Normal rate and regular rhythm.  Respiratory: Effort  normal and breath sounds normal. No respiratory distress.  Lymphadenopathy:       Head (right side): No preauricular and no posterior auricular adenopathy present.    She has no cervical adenopathy.  Neurological: She is alert. She has normal strength.  Gait assisted with a walker.  Skin: Skin is warm. No rash noted. No erythema.  Psychiatric: She has a normal mood and affect.  Well groomed, good eye contact.   ASSESSMENT AND PLAN:  Ms. Janiah was seen today for right ear pain.  Diagnoses and all orders for this visit:  Earache on right We discussed possible etiologies. Examination today negative for ear canal inflammation. Recommend avoiding scratching it with fingernails.  Hearing loss of right ear due to cerumen impaction After discussing benefits and risks of procedure, she would like to proceed with ear lavage. She tolerated well procedure. Right TM with no erythema. Hearing loss and tinnitus improved.  Recommend Debrox otic drops to help with cerumen accumulation. No Qtips.  -     carbamide peroxide (DEBROX) 6.5 % OTIC solution; Place 5 drops into the left ear 2 (two) times daily.  Return if symptoms worsen or fail to improve.   Micala Saltsman G. [name], MD  [facility]. [place] office.

## 2019-04-24 DIAGNOSIS — M4804 Spinal stenosis, thoracic region: Secondary | ICD-10-CM | POA: Diagnosis not present

## 2019-04-29 ENCOUNTER — Other Ambulatory Visit: Payer: Self-pay

## 2019-04-29 ENCOUNTER — Encounter: Payer: Self-pay | Admitting: Physical Medicine and Rehabilitation

## 2019-04-29 ENCOUNTER — Encounter: Payer: PPO | Attending: Physical Medicine and Rehabilitation | Admitting: Physical Medicine and Rehabilitation

## 2019-04-29 VITALS — BP 127/80 | HR 70 | Temp 97.8°F | Ht 64.0 in | Wt 200.0 lb

## 2019-04-29 DIAGNOSIS — R252 Cramp and spasm: Secondary | ICD-10-CM | POA: Diagnosis not present

## 2019-04-29 DIAGNOSIS — M069 Rheumatoid arthritis, unspecified: Secondary | ICD-10-CM | POA: Diagnosis not present

## 2019-04-29 DIAGNOSIS — M4712 Other spondylosis with myelopathy, cervical region: Secondary | ICD-10-CM | POA: Diagnosis not present

## 2019-04-29 DIAGNOSIS — M4804 Spinal stenosis, thoracic region: Secondary | ICD-10-CM | POA: Diagnosis not present

## 2019-04-29 NOTE — Progress Notes (Signed)
Procedure: Chemical denervation for spasticity Informed consent:  Discussed risks (weakness of nearby muscles, facial droop, eyelid droop, allergic reaction, infection, pain, bleeding, bruising, lack of effect, respiratory depression, death) and benefits of the procedure, as well as the alternatives.  Informed consent was obtained.  The area was prepared and draped in a standard fashion.   Botox was used as the denervating agent. The denervating agent was injected with a sterile needle at a dermal level into the target areas as follows:  100 units were injected into right arm:  123456 to flexor pollicis longus [id number] to flexor carpi radialis 10U to flexor carpi ulnaris 30U to flexor digitorum profundus 30U to flexor digitorum superficialis  The patient tolerated procedure well. Will RTC in 1 month to assess response.

## 2019-05-01 ENCOUNTER — Other Ambulatory Visit: Payer: Self-pay

## 2019-05-01 DIAGNOSIS — M4804 Spinal stenosis, thoracic region: Secondary | ICD-10-CM | POA: Diagnosis not present

## 2019-05-05 ENCOUNTER — Ambulatory Visit (INDEPENDENT_AMBULATORY_CARE_PROVIDER_SITE_OTHER): Payer: PPO | Admitting: Internal Medicine

## 2019-05-05 ENCOUNTER — Other Ambulatory Visit: Payer: Self-pay | Admitting: Internal Medicine

## 2019-05-05 ENCOUNTER — Other Ambulatory Visit: Payer: Self-pay

## 2019-05-05 ENCOUNTER — Encounter: Payer: Self-pay | Admitting: Internal Medicine

## 2019-05-05 VITALS — BP 122/78 | HR 72 | Temp 98.1°F | Ht 61.0 in | Wt 201.8 lb

## 2019-05-05 DIAGNOSIS — M4804 Spinal stenosis, thoracic region: Secondary | ICD-10-CM | POA: Diagnosis not present

## 2019-05-05 DIAGNOSIS — Z794 Long term (current) use of insulin: Secondary | ICD-10-CM

## 2019-05-05 DIAGNOSIS — E1142 Type 2 diabetes mellitus with diabetic polyneuropathy: Secondary | ICD-10-CM | POA: Diagnosis not present

## 2019-05-05 DIAGNOSIS — E1149 Type 2 diabetes mellitus with other diabetic neurological complication: Secondary | ICD-10-CM | POA: Diagnosis not present

## 2019-05-05 LAB — POCT GLYCOSYLATED HEMOGLOBIN (HGB A1C): Hemoglobin A1C: 7.3 % — AB (ref 4.0–5.6)

## 2019-05-05 MED ORDER — NOVOLOG FLEXPEN 100 UNIT/ML ~~LOC~~ SOPN: PEN_INJECTOR | SUBCUTANEOUS | 6 refills | Status: DC

## 2019-05-05 MED ORDER — BD PEN NEEDLE MICRO U/F 32G X 6 MM MISC: 11 refills | Status: DC

## 2019-05-05 MED ORDER — LANTUS SOLOSTAR 100 UNIT/ML ~~LOC~~ SOPN: 20.0000 [IU] | PEN_INJECTOR | Freq: Every day | SUBCUTANEOUS | 11 refills | Status: DC

## 2019-05-05 NOTE — Patient Instructions (Signed)
-   Continue Lantus 20 units ONCE a day  - Novolog 8 units with Breakfast and 10 units with Lunch and Supper  - Novolog correctional insulin: ADD extra units on insulin to your meal-time Novolog dose if your blood sugars are higher than 165. Use the scale below to help guide you:   Blood sugar before meal Number of units to inject  Less than 165 0 unit  166 - 200 1 units  201 -  235 2 units  236 -  270 3 units  271 -  305 4 units  306 -  340 5 units  341 -  375 6 units       HOW TO TREAT LOW BLOOD SUGARS (Blood sugar LESS THAN 70 MG/DL)  Please follow the RULE OF 15 for the treatment of hypoglycemia treatment (when your (blood sugars are less than 70 mg/dL)    STEP 1: Take 15 grams of carbohydrates when your blood sugar is low, which includes:   3-4 GLUCOSE TABS  OR  3-4 OZ OF JUICE OR REGULAR SODA OR  ONE TUBE OF GLUCOSE GEL     STEP 2: RECHECK blood sugar in 15 MINUTES STEP 3: If your blood sugar is still low at the 15 minute recheck --> then, go back to STEP 1 and treat AGAIN with another 15 grams of carbohydrates.

## 2019-05-05 NOTE — Progress Notes (Signed)
Name: [name]  Age/ Sex: 79 y.o., female   MRN/ DOB: [id number], May 06, 1940     PCP: [name], [name], MD   Reason for Endocrinology Evaluation: Type 2 diabetes Mellitus  Initial Endocrine Consultative Visit:  12/28/2017    PATIENT IDENTIFIER: [name] is a 79 y.o. female with a past medical history of PAT, T2 DM, and urinary incontinence, Hx of pancreatitis. The patient has followed with Endocrinology clinic since 12/28/2017 for consultative assistance with management of her diabetes.  DIABETIC HISTORY:  Ms. Mullinix was diagnosed with T2DM > 10 years ago.  She is intolerant to metformin due to fecal incontinence on 2000 mg a day. Her hemoglobin A1c has ranged from 6.7% in 09/01/2017, peaking at 7.9% in 2011.   SUBJECTIVE:   During the last visit (01/03/2019): Her A1c was 7.6 %, we continued lantus and Novolog doses.   Today (05/05/2019): Ms. Ferran is here for a 4 month follow up for diabetes management. She is with her daughter [name].  She checks her blood sugars multiple  times daily through the Dexcom . The patient has not had hypoglycemic episodes since the last clinic visit. Otherwise, the patient has not required any recent emergency interventions for hypoglycemia and has not had recent hospitalizations secondary to hyper or hypoglycemic episodes.   She is s/p spine sx in 12/2018    ROS: As per HPI and as detailed below: Review of Systems  Constitutional: Negative for fever and malaise/fatigue.  Respiratory: Negative.  Negative for cough and shortness of breath.   Cardiovascular: Negative.  Negative for chest pain and palpitations.  Gastrointestinal: Positive for constipation. Negative for diarrhea and nausea.  Genitourinary: Negative for frequency.  Skin: Negative.  Rash:    Neurological: Positive for tingling and focal weakness.  Endo/Heme/Allergies: Negative for polydipsia.      HOME DIABETES REGIMEN:  Lantus 20 units daily  Novolog 8 units with  breakfast , 10 units with lunch and supper CF: NovoLog (BG -130/35)  CONTINUOUS GLUCOSE MONITORING RECORD INTERPRETATION    Dates of Recording: 2/27-3/22/2021  Sensor description:Dexcom G6  Results statistics:   CGM use % of time 58  Average and SD 187/30  Time in range  42 %  % Time Above 180 56  % Time above 250 2  % Time Below target 0    Glycemic patterns summary: Hyperglycemia  Starts after lunch and continues until supper   Hyperglycemic episodes  Post-lunch   Hypoglycemic episodes occurred N/A  Overnight periods: Stable    DIABETIC COMPLICATIONS: Microvascular complications:   Neuropathy   Denies: retinopathy, nephropathy   Last eye exam: Completed 2019  Macrovascular complications:    Denies: CAD, PVD, CVA   HISTORY:  Past Medical History:  Past Medical History:  Diagnosis Date  . Anemia    during 1st pregnancy  . Anxiety   . Arthritis   . Cancer ([place])    Basal - face  . Complication of anesthesia   . Depression   . Diarrhea, functional   . Dysrhythmia    Paroysmal Atrial Tachycardia  . Essential hypertension   . Fibromyalgia   . GERD (gastroesophageal reflux disease)   . H/O acute pancreatitis   . Headache   . History of hiatal hernia   . Hyperlipemia   . Neuropathy   . Osteoporosis   . Panic attacks   . Paralysis ([place]) 01/06/2019   hands  . PAT (paroxysmal atrial tachycardia) ([place])   . Pneumonia   .  PONV (postoperative nausea and vomiting)   . PSVT (paroxysmal supraventricular tachycardia) ([place])   . Reflux   . Restless leg syndrome   . Spinal cord compression ([place])   . Type 2 diabetes mellitus (HCC)    Type II  . Vertigo    not current   Past Surgical History:  Past Surgical History:  Procedure Laterality Date  . ABDOMINAL HYSTERECTOMY    . ANKLE SURGERY Left    x 2  . ANTERIOR CERVICAL DECOMP/DISCECTOMY FUSION N/A 11/12/2017   Procedure: Cervical seven to Thoracic one  Anterior cervical  decompression/discectomy/fusion with removal of old plate;  Surgeon: [name], MD;  Location[facility]  Service: Neurosurgery;  Laterality: N/A;  . BACK SURGERY     T12-L1 fusion, Anterior Cervical fusion  . CARDIAC CATHETERIZATION N/A 02/16/2015   Procedure: Left Heart Cath and Coronary Angiography;  Surgeon: Sherren Mocha, MD;  Location: [facility];  Service: Cardiovascular;  Laterality: N/A;  . CHOLECYSTECTOMY    . COLONOSCOPY    . ESOPHAGOGASTRODUODENOSCOPY  12/21/2010   Procedure: ESOPHAGOGASTRODUODENOSCOPY (EGD);  Surgeon: Landry Dyke, MD;  Location: [facility];  Service: Endoscopy;  Laterality: N/A;  . EXCISION MORTON'S NEUROMA Right   . EYE SURGERY Bilateral    Cataract  . FRACTURE SURGERY     ankle x 2   . KNEE SURGERY Right    x 3- arthroscopy  . NECK SURGERY    . POSTERIOR CERVICAL FUSION/FORAMINOTOMY N/A 01/22/2018   Procedure: Cervical six-seven Posterior Decompression with Posterior Fixation Cervical Five to Thoracic Two;  Surgeon: [name], MD;  Location: [facility];  Service: Neurosurgery;  Laterality: N/A;  Cervical 6-7 Posteror decompression with posterior fixation Cervical 5 to Thoracic 2  . ROTATOR CUFF REPAIR Right   . TONSILLECTOMY    . TOTAL KNEE ARTHROPLASTY Left 04/10/2017   Procedure: LEFT TOTAL KNEE ARTHROPLASTY;  Surgeon: Paralee Cancel, MD;  [facility];  Service: Orthopedics;  Laterality: Left;  70 mins  . UPPER GASTROINTESTINAL ENDOSCOPY    . WRIST SURGERY Left    fracture    Social History:  reports that she has quit smoking. She has never used smokeless tobacco. She reports that she does not drink alcohol or use drugs. Family History:  Family History  Problem Relation Age of Onset  . Heart failure Mother   . Hyperlipidemia Mother   . Lung cancer Father        Lymphoma  . Hyperlipidemia Maternal Grandmother   . Peripheral vascular disease Maternal Grandmother      HOME MEDICATIONS: Allergies as of 05/05/2019      Reactions    Amoxil [amoxicillin] Swelling, Other (See Comments)   Lips Has patient had a PCN reaction causing immediate rash, facial/tongue/throat swelling, SOB or lightheadedness with hypotension:YES Has patient had a PCN reaction causing severe rash involving mucus membranes or skin necrosis:No Has patient had a PCN reaction that required hospitalization:No Has patient had a PCN reaction occurring within the last 10 years:Yes. If all of the above answers are "NO", then may proceed with Cephalosporin use.   Lisinopril Itching, Swelling   SWELLING REACTION UNSPECIFIED    Chocolate Hives   Demerol Nausea And Vomiting   Other Other (See Comments)   Raw food or nuts - GI pain      Medication List       Accurate as of May 05, 2019 10:51 AM. If you have any questions, ask your nurse or doctor.  aspirin EC 81 MG tablet Take 81 mg by mouth at bedtime.   BD Pen Needle Micro U/F 32G X 6 MM Misc Generic drug: Insulin Pen Needle 4 TIMES A DAY NOVOLOG(3) AND LANTUS9(1)   Calcium 600+D3 600-200 MG-UNIT Tabs Generic drug: Calcium Carb-Cholecalciferol Take 1 tablet by mouth at bedtime.   Cranberry 450 MG Caps Take 900 mg by mouth 2 (two) times daily. 2 in am 1 at night   Debrox 6.5 % OTIC solution Generic drug: carbamide peroxide Place 5 drops into the left ear 2 (two) times daily.   Dexcom G6 Receiver Devi 1 Device by Does not apply route daily.   Dexcom G6 Sensor Misc 1 kit by Does not apply route as directed.   Dexcom G6 Transmitter Misc 1 kit by Does not apply route as directed.   digoxin 0.125 MG tablet Commonly known as: Lanoxin Take 1 tablet (0.125 mg total) by mouth daily. Patient needs appointment for further refills   docusate sodium 100 MG capsule Commonly known as: COLACE Take 100 mg by mouth daily. twice a day   DULoxetine 30 MG capsule Commonly known as: Cymbalta Take 1 capsule (30 mg total) by mouth daily. What changed: when to take this   ezetimibe 10 MG  tablet Commonly known as: ZETIA TAKE 1 TABLET BY MOUTH EVERY DAY   glucose blood test strip Use as instructed to test blood sugars 3 times daily [id number]   insulin glargine 100 UNIT/ML injection Commonly known as: LANTUS Inject 20 Units into the skin daily.   meloxicam 15 MG tablet Commonly known as: MOBIC Take 15 mg by mouth daily.   metoprolol succinate 25 MG 24 hr tablet Commonly known as: TOPROL-XL Take 1 tablet (25 mg total) by mouth daily. Patient needs to schedule an appointment for an additional refill   Myrbetriq 25 MG Tb24 tablet Generic drug: mirabegron ER TAKE 1 TABLET BY MOUTH EVERY DAY   NovoLOG FlexPen 100 UNIT/ML FlexPen Generic drug: insulin aspart Inject 10 Units into the skin daily with breakfast AND 14 Units daily with lunch AND 14 Units daily with supper. What changed: See the new instructions.   omeprazole 20 MG capsule Commonly known as: PRILOSEC Take 1 capsule (20 mg total) by mouth daily.   PRESERVISION AREDS 2 PO Take 1 tablet by mouth 2 (two) times daily.   ramipril 10 MG capsule Commonly known as: ALTACE Take 1 capsule (10 mg total) by mouth every evening.   RELION INSULIN SYRINGE 1ML/31G 31G X 5/16" 1 ML Misc Generic drug: Insulin Syringe-Needle U-100 Use as directed   rOPINIRole 1 MG tablet Commonly known as: REQUIP TAKE ONE TABLET BY MOUTH AT BEDTIME.   verapamil 120 MG tablet Commonly known as: CALAN Take 120 mg by mouth daily as needed (afib).   verapamil 240 MG 24 hr capsule Commonly known as: VERELAN PM Take 1 capsule (240 mg total) by mouth daily.   VITAMIN D PO Take 10,000 Units by mouth daily.        OBJECTIVE:   Vital Signs: BP 122/78 (BP Location: Left Arm, Patient Position: Sitting, Cuff Size: Large)   Pulse 72   Temp 98.1 F (36.7 C)   Ht 5' 1"  (1.549 m)   Wt 201 lb 12.8 oz (91.5 kg)   SpO2 97%   BMI 38.13 kg/m   Wt Readings from Last 3 Encounters:  05/05/19 201 lb 12.8 oz (91.5 kg)  04/29/19 200 lb  (90.7 kg)  04/23/19 201 lb 2 oz (  91.2 kg)     Exam: General: Pt appears well and is in NAD  Lungs: Clear with good BS bilat with no rales, rhonchi, or wheezes  Heart: RRR with normal S1 and S2 and no gallops; no murmurs; no rub  Extremities: No pretibial edema.  Neuro: MS is good with appropriate affect, pt is alert and Ox3    DM foot exam:01/03/2019 The skin of the feet is intact without sores or ulcerations. The pedal pulses are 2+ on right and 2+ on left. The sensation is decreased to a screening 5.07, 10 gram monofilament bilaterally     DATA REVIEWED:  Lab Results  Component Value Date   HGBA1C 7.6 (A) 01/03/2019   HGBA1C 6.8 (A) 10/02/2018   HGBA1C 5.9 (A) 06/21/2018   Lab Results  Component Value Date   MICROALBUR 26.1 (H) 01/12/2017   LDLCALC 82 01/20/2016   CREATININE 0.89 01/08/2019   Lab Results  Component Value Date   MICRALBCREAT 14.1 01/12/2017     ASSESSMENT / PLAN / RECOMMENDATIONS:   1) Type 2 Diabetes Mellitus, Sub-Optimally Controlled, With  neuropathic complications - Most recent A1c of 7.3 %. Goal A1c < 7.0 %.   -A1c down from 7.6% -She did have hyperglycemia over the past few months, part of this is attributed to stress from spinal surgery, wound infection, and dietary indiscretions. -In the past few weeks she has done better with eating better, she does not want to make any changes at this point, her in office BG through the CGM was 108 mg/DL.  MEDICATIONS:  Lantus 20 units once a day  NovoLog 8 units with Breakfast and 10 units with Lunch and Supper   CF: (BG- 130/35)    EDUCATION / INSTRUCTIONS:  BG monitoring instructions: Patient is instructed to check her blood sugars 4 times a day, before meals and bedtime.  Call [facility] if: BG persistently < 70 or > 300. . I reviewed the Rule of 15 for the treatment of hypoglycemia in detail with the patient. Literature supplied.   F/U in 4 months    signed  electronically by: Mack Guise, MD  [facility] End[facility]  [facility] [address] [place], [place] [address] Phone: [phone] FAX: [phone]   CC: [name], [name], [place] [address] Phone: [phone]  Fax: [phone]  Return to Endocrinology clinic as below: Future Appointments  Date Time Provider [place]  05/29/2019 10:40 AM Raulkar, Clide Deutscher, MD [facility] CPR

## 2019-05-06 ENCOUNTER — Encounter: Payer: Self-pay | Admitting: Family Medicine

## 2019-05-06 LAB — HM DIABETES EYE EXAM

## 2019-05-13 DIAGNOSIS — M4804 Spinal stenosis, thoracic region: Secondary | ICD-10-CM | POA: Diagnosis not present

## 2019-05-15 ENCOUNTER — Other Ambulatory Visit: Payer: Self-pay | Admitting: Family Medicine

## 2019-05-15 DIAGNOSIS — M4804 Spinal stenosis, thoracic region: Secondary | ICD-10-CM | POA: Diagnosis not present

## 2019-05-15 DIAGNOSIS — M797 Fibromyalgia: Secondary | ICD-10-CM

## 2019-05-20 DIAGNOSIS — M4804 Spinal stenosis, thoracic region: Secondary | ICD-10-CM | POA: Diagnosis not present

## 2019-05-22 DIAGNOSIS — H11823 Conjunctivochalasis, bilateral: Secondary | ICD-10-CM | POA: Diagnosis not present

## 2019-05-27 ENCOUNTER — Other Ambulatory Visit: Payer: Self-pay | Admitting: Cardiology

## 2019-05-29 ENCOUNTER — Encounter: Payer: PPO | Admitting: Physical Medicine and Rehabilitation

## 2019-05-29 DIAGNOSIS — M4804 Spinal stenosis, thoracic region: Secondary | ICD-10-CM | POA: Diagnosis not present

## 2019-06-03 DIAGNOSIS — M4804 Spinal stenosis, thoracic region: Secondary | ICD-10-CM | POA: Diagnosis not present

## 2019-06-05 DIAGNOSIS — E1149 Type 2 diabetes mellitus with other diabetic neurological complication: Secondary | ICD-10-CM | POA: Diagnosis not present

## 2019-06-05 DIAGNOSIS — L820 Inflamed seborrheic keratosis: Secondary | ICD-10-CM | POA: Diagnosis not present

## 2019-06-05 DIAGNOSIS — L821 Other seborrheic keratosis: Secondary | ICD-10-CM | POA: Diagnosis not present

## 2019-06-06 ENCOUNTER — Encounter: Payer: PPO | Attending: Physical Medicine and Rehabilitation | Admitting: Physical Medicine and Rehabilitation

## 2019-06-06 ENCOUNTER — Other Ambulatory Visit: Payer: Self-pay

## 2019-06-06 ENCOUNTER — Encounter: Payer: Self-pay | Admitting: Physical Medicine and Rehabilitation

## 2019-06-06 VITALS — BP 128/84 | HR 79 | Temp 98.2°F | Ht 61.0 in | Wt 199.8 lb

## 2019-06-06 DIAGNOSIS — M069 Rheumatoid arthritis, unspecified: Secondary | ICD-10-CM | POA: Insufficient documentation

## 2019-06-06 DIAGNOSIS — M797 Fibromyalgia: Secondary | ICD-10-CM | POA: Insufficient documentation

## 2019-06-06 DIAGNOSIS — M4712 Other spondylosis with myelopathy, cervical region: Secondary | ICD-10-CM | POA: Diagnosis not present

## 2019-06-06 DIAGNOSIS — R252 Cramp and spasm: Secondary | ICD-10-CM | POA: Diagnosis not present

## 2019-06-06 DIAGNOSIS — M4804 Spinal stenosis, thoracic region: Secondary | ICD-10-CM | POA: Diagnosis not present

## 2019-06-06 MED ORDER — DULOXETINE HCL 30 MG PO CPEP: 60.0000 mg | ORAL_CAPSULE | Freq: Every day | ORAL | 3 refills | Status: DC

## 2019-06-06 NOTE — Progress Notes (Signed)
Subjective:    Patient ID: [name], female    DOB: 07/16/1940, 79 y.o.   MRN: [id number]  HPI  Mrs. Xiang is a 79 year old woman who presents for evaluation of right hand flexor spasticity following a fall resulting in spinal cord damage. Her aide accompanies her today.   She has a history of severe cervical, thoracic, and lumbar spinal stenosis s/p two ACDFs and thoracic decompression, fusion, and posterior lateral arthrodesis with pedicle screws.   Prior history: She has been having severe right sided flexor spasticity since then causing her fingers to curve inward to her palms. She has been using hand splints and exercising her hands to keep them mobile. This is very disruptive to her as she would love to be able to return to cooking and knitting, activities that she loves. She does not have other areas of spasticity. Her land hand is weak but not spastic with the exception of one of her fingers.   She ambulates using a walker and has had no recent falls. She is currently receiving PT for her right handed tone.   Denies pain this visit. Last visit I performed Botox as follows: 100 units were injected into right arm:  123456 to flexor pollicis longus [id number] to flexor carpi radialis 10U to flexor carpi ulnaris 30U to flexor digitorum profundus 30U to flexor digitorum superficialis  She had some benefit from this, especially in thumb, without any side effects. She would like to try a higher dose next time.   She also has fibromyalgia and takes Cymbalta 30mg  which helps. She occasionally has pains throughout the day.   Pain Inventory Average Pain 0 Pain Right Now 0 My pain is na  In the last 24 hours, has pain interfered with the following? General activity 0 Relation with others 0 Enjoyment of life 0 What TIME of day is your pain at its worst? na Sleep (in general) Good  Pain is worse with: na Pain improves with: na Relief from Meds: na  Mobility walk with  assistance use a walker how many minutes can you walk? 15 ability to climb steps?  no do you drive?  no  Function I need assistance with the following:  meal prep, household duties and shopping  Neuro/Psych trouble walking  Prior Studies nerve study  Physicians involved in your care Primary care . Neurologist . Psychiatrist . Rheumatologist . Neurosurgeon . Orthopedist . Psychologist .   Family History  Problem Relation Age of Onset  . Heart failure Mother   . Hyperlipidemia Mother   . Lung cancer Father        Lymphoma  . Hyperlipidemia Maternal Grandmother   . Peripheral vascular disease Maternal Grandmother    Social History   Socioeconomic History  . Marital status: Widowed    Spouse name: Glendell Docker  . Number of children: 2  . Years of education: Not on file  . Highest education level: Not on file  Occupational History  . Not on file  Tobacco Use  . Smoking status: Former Research scientist (life sciences)  . Smokeless tobacco: Never Used  . Tobacco comment: smoke 1 cigarette every few weeks when the [place] lady came by.  Substance and Sexual Activity  . Alcohol use: No  . Drug use: No  . Sexual activity: Not on file  Other Topics Concern  . Not on file  Social History Narrative   Lives home alone.  Daughter [name] with her today.   Independent of ADLs.  Education HS.  Widowed.     Social Determinants of Health   Financial Resource Strain:   . Difficulty of Paying Living Expenses:   Food Insecurity:   . Worried About Charity fundraiser in the Last Year:   . Arboriculturist in the Last Year:   Transportation Needs:   . Film/video editor (Medical):   Marland Kitchen Lack of Transportation (Non-Medical):   Physical Activity:   . Days of Exercise per Week:   . Minutes of Exercise per Session:   Stress:   . Feeling of Stress :   Social Connections:   . Frequency of Communication with Friends and Family:   . Frequency of Social Gatherings with Friends and Family:   . Attends Religious  Services:   . Active Member of Clubs or Organizations:   . Attends Archivist Meetings:   Marland Kitchen Marital Status:    Past Surgical History:  Procedure Laterality Date  . ABDOMINAL HYSTERECTOMY    . ANKLE SURGERY Left    x 2  . ANTERIOR CERVICAL DECOMP/DISCECTOMY FUSION N/A 11/12/2017   Procedure: Cervical seven to Thoracic one  Anterior cervical decompression/discectomy/fusion with removal of old plate;  Surgeon: [name], MD;  Location: [facility];  Service: Neurosurgery;  Laterality: N/A;  . BACK SURGERY     T12-L1 fusion, Anterior Cervical fusion  . CARDIAC CATHETERIZATION N/A 02/16/2015   Procedure: Left Heart Cath and Coronary Angiography;  Surgeon: Sherren Mocha, MD;  Location: [facility];  Service: Cardiovascular;  Laterality: N/A;  . CHOLECYSTECTOMY    . COLONOSCOPY    . ESOPHAGOGASTRODUODENOSCOPY  12/21/2010   Procedure: ESOPHAGOGASTRODUODENOSCOPY (EGD);  Surgeon: Landry Dyke, MD;  Location: [facility];  Service: Endoscopy;  Laterality: N/A;  . EXCISION MORTON'S NEUROMA Right   . EYE SURGERY Bilateral    Cataract  . FRACTURE SURGERY     ankle x 2   . KNEE SURGERY Right    x 3- arthroscopy  . NECK SURGERY    . POSTERIOR CERVICAL FUSION/FORAMINOTOMY N/A 01/22/2018   Procedure: Cervical six-seven Posterior Decompression with Posterior Fixation Cervical Five to Thoracic Two;  Surgeon: [name], MD;  Location: [facility];  Service: Neurosurgery;  Laterality: N/A;  Cervical 6-7 Posteror decompression with posterior fixation Cervical 5 to Thoracic 2  . ROTATOR CUFF REPAIR Right   . TONSILLECTOMY    . TOTAL KNEE ARTHROPLASTY Left 04/10/2017   Procedure: LEFT TOTAL KNEE ARTHROPLASTY;  Surgeon: Paralee Cancel, MD;  [facility];  Service: Orthopedics;  Laterality: Left;  70 mins  . UPPER GASTROINTESTINAL ENDOSCOPY    . WRIST SURGERY Left    fracture   Past Medical History:  Diagnosis Date  . Anemia    during 1st pregnancy  . Anxiety   . Arthritis   .  Cancer ([place])    Basal - face  . Complication of anesthesia   . Depression   . Diarrhea, functional   . Dysrhythmia    Paroysmal Atrial Tachycardia  . Essential hypertension   . Fibromyalgia   . GERD (gastroesophageal reflux disease)   . H/O acute pancreatitis   . Headache   . History of hiatal hernia   . Hyperlipemia   . Neuropathy   . Osteoporosis   . Panic attacks   . Paralysis ([facility]) 01/06/2019   hands  . PAT (paroxysmal atrial tachycardia) ([place])   . Pneumonia   . PONV (postoperative nausea and vomiting)   . PSVT (paroxysmal supraventricular tachycardia) ([place])   .  Reflux   . Restless leg syndrome   . Spinal cord compression ([place])   . Type 2 diabetes mellitus ([facility])    Type II  . Vertigo    not current   BP 128/84   Pulse 79   Temp 98.2 F (36.8 C)   Ht 5\' 1"  (1.549 m)   Wt 199 lb 12.8 oz (90.6 kg)   SpO2 97%   BMI 37.75 kg/m   Opioid Risk Score:   Fall Risk Score:  `1  Depression screen PHQ 2/9  Depression screen [facility] 2/9 04/08/2019 09/28/2018 08/23/2018 10/19/2017 09/05/2017 03/27/2016 03/16/2016  Decreased Interest 3 0 0 1 0 0 0  Down, Depressed, Hopeless 3 1 0 2 0 0 0  PHQ - 2 Score 6 1 0 3 0 0 0  Altered sleeping 3 - - 2 - - -  Tired, decreased energy 3 - - 3 - - -  Change in appetite 0 - - 3 - - -  Feeling bad or failure about yourself  3 - - 3 - - -  Trouble concentrating 3 - - 2 - - -  Moving slowly or fidgety/restless 2 - - 1 - - -  Suicidal thoughts 3 - - 3 - - -  PHQ-9 Score 23 - - 20 - - -  Difficult doing work/chores - - - Not difficult at all - - -  Some recent data might be hidden     Review of Systems     Objective:   Physical Exam General: Alert and oriented x 3, No apparent distress HEENT: Head is normocephalic, atraumatic, PERRLA, EOMI, sclera anicteric, oral mucosa pink and moist, dentition intact, ext ear canals clear,  Neck: Supple without JVD or lymphadenopathy Heart: Reg rate and rhythm. No murmurs rubs or gallops Chest: CTA  bilaterally without wheezes, rales, or rhonchi; no distress Abdomen: Soft, non-tender, non-distended, bowel sounds positive. Extremities: No clubbing, cyanosis, or edema. Pulses are 2+ Skin: Clean and intact without signs of breakdown Neuro/MSK: Pt is cognitively appropriate with normal insight, memory, and awareness. Cranial nerves 2-12 are intact. Sensory exam is normal. MAS 2 in right hand finger flexors. Rheumatic changes along right hand MCPs.   Psych: Pt's affect is appropriate. Pt is cooperative.        Assessment & Plan:  Mrs. Faris is a 79 year old woman who presents for evaluation of right hand flexor spasticity following a fall resulting in spinal cord damage.    -Will apply for insurance authorization for 200U of Botox to be administered as follows to the right arm: 123456 to flexor pollicis longus 123456 to adductor pollicis longus [id number] to flexor carpi radialis 30U to flexor carpi ulnaris 50U to flexor digitorum profundus 50U to flexor digitorum superficialis Discussed potential side effects of Botox such as asthenia. If it causes her to be too weak to grasp her walker, we may have to decrease doses.    -Continue use of splint at night and exercises to maintain range of motion in right hand.   --Increased Cymbalta to 30mg  for better control of fibromyalgia.   RTC in 2 months for Botox injections.

## 2019-06-09 ENCOUNTER — Telehealth: Payer: Self-pay | Admitting: Family Medicine

## 2019-06-09 NOTE — Progress Notes (Signed)
  Chronic Care Management   Outreach Note  06/09/2019 Name: ANEATRA SPELLMAN MRN: [id number] DOB: 11/14/1940  Referred by: [name], [name], MD Reason for referral : No chief complaint on file.   An unsuccessful telephone outreach was attempted today. The patient was referred to the pharmacist for assistance with care management and care coordination.   This note is not being shared with the patient for the following reason: To respect privacy (The patient or proxy has requested that the information not be shared). Follow Up Plan:    Raynicia Dukes [employer] Scheduler

## 2019-06-10 DIAGNOSIS — M4804 Spinal stenosis, thoracic region: Secondary | ICD-10-CM | POA: Diagnosis not present

## 2019-06-12 DIAGNOSIS — H11823 Conjunctivochalasis, bilateral: Secondary | ICD-10-CM | POA: Diagnosis not present

## 2019-06-19 DIAGNOSIS — M4804 Spinal stenosis, thoracic region: Secondary | ICD-10-CM | POA: Diagnosis not present

## 2019-07-01 DIAGNOSIS — M4804 Spinal stenosis, thoracic region: Secondary | ICD-10-CM | POA: Diagnosis not present

## 2019-07-02 ENCOUNTER — Telehealth: Payer: Self-pay | Admitting: Family Medicine

## 2019-07-02 NOTE — Progress Notes (Signed)
  Chronic Care Management   Outreach Note  07/02/2019 Name: [name] MRN: [id number] DOB: Mar 19, 1940  Referred by: [name], [name], MD Reason for referral : No chief complaint on file.   A second unsuccessful telephone outreach was attempted today. The patient was referred to pharmacist for assistance with care management and care coordination.  Follow Up Plan:   Prathima Ghanta [employer] Scheduler

## 2019-07-03 DIAGNOSIS — M4804 Spinal stenosis, thoracic region: Secondary | ICD-10-CM | POA: Diagnosis not present

## 2019-07-04 ENCOUNTER — Other Ambulatory Visit: Payer: Self-pay | Admitting: Family Medicine

## 2019-07-06 ENCOUNTER — Other Ambulatory Visit: Payer: Self-pay | Admitting: Cardiology

## 2019-07-06 DIAGNOSIS — I10 Essential (primary) hypertension: Secondary | ICD-10-CM

## 2019-07-07 DIAGNOSIS — E1149 Type 2 diabetes mellitus with other diabetic neurological complication: Secondary | ICD-10-CM | POA: Diagnosis not present

## 2019-07-08 ENCOUNTER — Telehealth: Payer: Self-pay | Admitting: Internal Medicine

## 2019-07-08 DIAGNOSIS — M4804 Spinal stenosis, thoracic region: Secondary | ICD-10-CM | POA: Diagnosis not present

## 2019-07-08 NOTE — Telephone Encounter (Signed)
Received records from [facility] forwarded 4 pagfes to Dr. Melanie Crazier [facility] 5/25/21fbg

## 2019-07-08 NOTE — Telephone Encounter (Signed)
Pts daughter is calling back to check the status of her refill of Rx meloxicam 15 MG  Pharm:  [facility] in [place], [place]  Pts daughter is very concerned with pt not being able to get this medication for her pain.  She alternates with meloxicam and tylenol.

## 2019-07-09 ENCOUNTER — Telehealth: Payer: Self-pay | Admitting: Family Medicine

## 2019-07-09 NOTE — Progress Notes (Signed)
  Chronic Care Management   Note  07/09/2019 Name: SHARLOTTE FLAM MRN: [id number] DOB: 09-Dec-1940  MYTHILI KNEISEL is a 79 y.o. year old female who is a primary care patient of [name], Malka So, MD. I reached out to [name] by phone today in response to a referral sent by Ms. Wallene Dales Dillen's PCP, [name], [name], MD.   Ms. Dunavan was given information about Chronic Care Management services today including:  1. CCM service includes personalized support from designated clinical staff supervised by her physician, including individualized plan of care and coordination with other care providers 2. 24/7 contact phone numbers for assistance for urgent and routine care needs. 3. Service will only be billed when office clinical staff spend 20 minutes or more in a month to coordinate care. 4. Only one practitioner may furnish and bill the service in a calendar month. 5. The patient may stop CCM services at any time (effective at the end of the month) by phone call to the office staff.   Patient agreed to services and verbal consent obtained.   Spoke with Carlyle Basques  Follow up plan:   [place]

## 2019-07-10 ENCOUNTER — Other Ambulatory Visit: Payer: Self-pay | Admitting: Cardiology

## 2019-07-10 DIAGNOSIS — M4804 Spinal stenosis, thoracic region: Secondary | ICD-10-CM | POA: Diagnosis not present

## 2019-07-17 DIAGNOSIS — M79672 Pain in left foot: Secondary | ICD-10-CM | POA: Diagnosis not present

## 2019-07-17 DIAGNOSIS — M4804 Spinal stenosis, thoracic region: Secondary | ICD-10-CM | POA: Diagnosis not present

## 2019-07-18 ENCOUNTER — Other Ambulatory Visit: Payer: Self-pay | Admitting: Family Medicine

## 2019-07-18 DIAGNOSIS — R350 Frequency of micturition: Secondary | ICD-10-CM

## 2019-07-22 DIAGNOSIS — M4804 Spinal stenosis, thoracic region: Secondary | ICD-10-CM | POA: Diagnosis not present

## 2019-07-29 DIAGNOSIS — M4804 Spinal stenosis, thoracic region: Secondary | ICD-10-CM | POA: Diagnosis not present

## 2019-07-31 DIAGNOSIS — M4804 Spinal stenosis, thoracic region: Secondary | ICD-10-CM | POA: Diagnosis not present

## 2019-08-05 ENCOUNTER — Telehealth: Payer: Self-pay | Admitting: Internal Medicine

## 2019-08-05 DIAGNOSIS — M1711 Unilateral primary osteoarthritis, right knee: Secondary | ICD-10-CM | POA: Diagnosis not present

## 2019-08-05 DIAGNOSIS — M25561 Pain in right knee: Secondary | ICD-10-CM | POA: Diagnosis not present

## 2019-08-05 DIAGNOSIS — M79672 Pain in left foot: Secondary | ICD-10-CM | POA: Diagnosis not present

## 2019-08-05 DIAGNOSIS — M19072 Primary osteoarthritis, left ankle and foot: Secondary | ICD-10-CM | POA: Diagnosis not present

## 2019-08-05 NOTE — Telephone Encounter (Signed)
Patient is getting a cortisone shot in her knee today. Patient was advised previously to contact this office when this occurs so that her insulin dosing can be changed.  Patient needs to know how to increase insulin intake to accommodate increased blood sugars and needs to know how long to make these changes  Call back [name] [phone] (daughter in [place]) and can leave VM

## 2019-08-05 NOTE — Telephone Encounter (Signed)
Called pt and left voicemail requesting a call back to give her these temporary dosing instructions for insulin.

## 2019-08-05 NOTE — Telephone Encounter (Signed)
Pt's daughter returned phone call and she was given MD message. Pt's daughter verbalized understanding and did not voice any additional questions or concerns at this time.

## 2019-08-05 NOTE — Telephone Encounter (Signed)
Please advise 

## 2019-08-06 ENCOUNTER — Ambulatory Visit (HOSPITAL_COMMUNITY)
Admission: RE | Admit: 2019-08-06 | Discharge: 2019-08-06 | Disposition: A | Discharge status: Home / Self Care | Payer: PPO | Source: Ambulatory Visit | Attending: Physical Medicine and Rehabilitation | Admitting: Physical Medicine and Rehabilitation

## 2019-08-06 ENCOUNTER — Other Ambulatory Visit: Payer: Self-pay

## 2019-08-06 ENCOUNTER — Encounter: Payer: PPO | Attending: Physical Medicine and Rehabilitation | Admitting: Physical Medicine and Rehabilitation

## 2019-08-06 ENCOUNTER — Encounter: Payer: Self-pay | Admitting: Physical Medicine and Rehabilitation

## 2019-08-06 VITALS — BP 135/85 | HR 89 | Temp 95.7°F | Wt 208.0 lb

## 2019-08-06 DIAGNOSIS — R109 Unspecified abdominal pain: Secondary | ICD-10-CM | POA: Diagnosis not present

## 2019-08-06 DIAGNOSIS — K5909 Other constipation: Secondary | ICD-10-CM | POA: Insufficient documentation

## 2019-08-06 DIAGNOSIS — R252 Cramp and spasm: Secondary | ICD-10-CM | POA: Diagnosis not present

## 2019-08-06 DIAGNOSIS — M069 Rheumatoid arthritis, unspecified: Secondary | ICD-10-CM | POA: Insufficient documentation

## 2019-08-06 DIAGNOSIS — M797 Fibromyalgia: Secondary | ICD-10-CM | POA: Diagnosis not present

## 2019-08-06 DIAGNOSIS — M4712 Other spondylosis with myelopathy, cervical region: Secondary | ICD-10-CM | POA: Diagnosis not present

## 2019-08-06 MED ORDER — PREGABALIN 25 MG PO CAPS: 25.0000 mg | ORAL_CAPSULE | Freq: Every day | ORAL | 3 refills | Status: DC

## 2019-08-06 NOTE — Patient Instructions (Signed)
Start Senna HS

## 2019-08-06 NOTE — Progress Notes (Signed)
Botox Injection for spasticity using needle EMG guidance  Indication: Severe spasticity which interferes with ADL,mobility and/or  hygiene and is unresponsive to medication management and other conservative care  Informed consent was obtained after describing risks and benefits of the procedure with the patient. This includes bleeding, bruising, infection, excessive weakness, or medication side effects. A REMS form is on file and signed . Number of units per muscle 77A to flexor pollicis longus 12I to adductor pollicis longus 78M to flexor carpi radialis 30U to flexor carpi ulnaris 50U to flexor digitorum profundus 50U to flexor digitorum superficialis  All injections were done after obtaining appropriate EMG activity and after negative drawback for blood. The patient tolerated the procedure well. Post procedure instructions were given. A followup appointment was made.

## 2019-08-07 DIAGNOSIS — E1149 Type 2 diabetes mellitus with other diabetic neurological complication: Secondary | ICD-10-CM | POA: Diagnosis not present

## 2019-08-11 ENCOUNTER — Telehealth: Payer: Self-pay

## 2019-08-11 DIAGNOSIS — M79672 Pain in left foot: Secondary | ICD-10-CM | POA: Diagnosis not present

## 2019-08-11 NOTE — Telephone Encounter (Signed)
Message was routed to Dr Ranell Patrick

## 2019-08-11 NOTE — Telephone Encounter (Signed)
This maybe a repeat telephone encounter. Please advise.

## 2019-08-11 NOTE — Telephone Encounter (Signed)
Please review: [name] is in pain & advise.

## 2019-08-11 NOTE — Telephone Encounter (Signed)
[name] has a spot on her left foot that is swollen. She also c/o a constant  spasm pain at a level 10, in the same foot.   The pain was relieved by a Toradol 60 MG injection yesterday, given by Home Health. But today the pain is back at a level 10. Patient has  been dx with Arthritis in the left foot.   Daughter has been advised to have her take the pain meds and directed. Call the Orthopedic MD. Dr. Ranell Patrick is not in the office at this time.   Please advise.

## 2019-08-12 DIAGNOSIS — M4804 Spinal stenosis, thoracic region: Secondary | ICD-10-CM | POA: Diagnosis not present

## 2019-08-14 DIAGNOSIS — M4804 Spinal stenosis, thoracic region: Secondary | ICD-10-CM | POA: Diagnosis not present

## 2019-08-16 ENCOUNTER — Other Ambulatory Visit: Payer: Self-pay | Admitting: Family Medicine

## 2019-08-16 DIAGNOSIS — I10 Essential (primary) hypertension: Secondary | ICD-10-CM

## 2019-08-19 ENCOUNTER — Other Ambulatory Visit: Payer: Self-pay

## 2019-08-19 ENCOUNTER — Ambulatory Visit: Payer: PPO

## 2019-08-19 DIAGNOSIS — E119 Type 2 diabetes mellitus without complications: Secondary | ICD-10-CM

## 2019-08-19 DIAGNOSIS — R300 Dysuria: Secondary | ICD-10-CM

## 2019-08-19 DIAGNOSIS — M4804 Spinal stenosis, thoracic region: Secondary | ICD-10-CM | POA: Diagnosis not present

## 2019-08-19 DIAGNOSIS — M159 Polyosteoarthritis, unspecified: Secondary | ICD-10-CM

## 2019-08-19 DIAGNOSIS — E785 Hyperlipidemia, unspecified: Secondary | ICD-10-CM

## 2019-08-19 DIAGNOSIS — I10 Essential (primary) hypertension: Secondary | ICD-10-CM

## 2019-08-19 DIAGNOSIS — I471 Supraventricular tachycardia: Secondary | ICD-10-CM

## 2019-08-19 NOTE — Chronic Care Management (AMB) (Signed)
Chronic Care Management Pharmacy  Name: [name]  MRN: [id number] DOB: 1940-08-17  Initial Questions: 1. Have you seen any other providers since your last visit? NA 2. Any changes in your medicines or health? No   Chief Complaint/ HPI  [name],  79 y.o. , female presents for their Initial CCM visit with the clinical pharmacist via telephone due to COVID-19 Pandemic.  Patient and daughter were both on phone with daughter being primary historian. Daughter reported pain is the biggest concern since they were told by Dr. Ranell Patrick that meloxicam is not meant to be taking daily due to concern of stomach ulcer risk.   Daughter reports patient has gained 8 lbs last 3 months.  Patient goes to physical therapy 2x/ week and has a caregiver 6 hrs in the day and 3 hrs in the evening.   PCP : [name], [name], MD  Their chronic conditions include: atrial tachycardia, HTN, DM, HLD, osteoarthritis, fibromyalgia, RLS, GERD, Constipation, dysuria  Office Visits: 04/23/2019- Betty [name], MD- Patient presented for office visit for right ear pain. Recommended Debrox otic drops.   Consult Visit: 08/06/2019- Physical Medicine and Rehabilitation- Leeroy Cha, MD- Patient presented for office visit for post-traumatic spasticity. Patient received Botox injection for spasticity using needle EMG guidance. 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 Patient to apply for insurance authorization for 200U of Botox. Increase Cymbalta to 76m for better control of fibromyalgia. Patient to return in 2 months for Botox injections.  05/05/2019- Endocrinology- ICloyd Stagers MD- Patient presented for office visit for DM follow up. No changes done to insulin. Patient to focus on eating better.   10/29/2018-  Cardiology- MCandee Furbish MD- Patient presented for office visit to establish care (previous patient from Dr. TWynonia Lawman. Patient presented as stable. No medications changes. Follow up in 12 months.   Medications: Outpatient Encounter Medications as of 08/19/2019  Medication Sig  . acetaminophen (TYLENOL) 325 MG tablet Take 325 mg by mouth every 6 (six) hours as needed.  .Marland Kitchenaspirin EC 81 MG tablet Take 81 mg by mouth at bedtime.   . Calcium Carb-Cholecalciferol (CALCIUM 600+D3) 600-200 MG-UNIT TABS Take 1 tablet by mouth at bedtime.   . carbamide peroxide (DEBROX) 6.5 % OTIC solution Place 5 drops into the left ear 2 (two) times daily.  . Continuous Blood Gluc Receiver (DEXCOM G6 RECEIVER) DEVI 1 Device by Does not apply route daily.  . Continuous Blood Gluc Sensor (DEXCOM G6 SENSOR) MISC 1 kit by Does not apply route as directed.  . Continuous Blood Gluc Transmit (DEXCOM G6 TRANSMITTER) MISC 1 kit by Does not apply route as directed.  . digoxin (LANOXIN) 0.125 MG tablet Take 1 tablet (0.125 mg total) by mouth daily. Please make yearly appt for September before anymore refills. 1st attempt  . docusate sodium (COLACE) 100 MG capsule Take 100 mg by mouth daily. twice a day  . DULoxetine (CYMBALTA) 30 MG capsule Take 2 capsules (60 mg total) by mouth daily.  .Marland Kitchenezetimibe (ZETIA) 10 MG tablet TAKE 1 TABLET BY MOUTH EVERY DAY  . glucose blood test strip Use as instructed to test blood sugars 3 times daily [id number]  . insulin aspart (NOVOLOG FLEXPEN) 100 UNIT/ML FlexPen Inject 8 Units into the skin daily with breakfast AND 10 Units daily with lunch AND 10 Units  daily with supper. Max daily 50 units per correction scale.  . insulin glargine (LANTUS SOLOSTAR) 100 UNIT/ML Solostar Pen Inject 20 Units into the skin daily.  . Insulin Pen Needle (BD PEN NEEDLE MICRO U/F) 32G X 6 MM MISC 4 TIMES A DAY NOVOLOG(3) AND LANTUS9(1)  . meloxicam (MOBIC) 15 MG tablet TAKE 1 TABLET BY MOUTH EVERY DAY  . metoprolol  succinate (TOPROL-XL) 25 MG 24 hr tablet Take 1 tablet (25 mg total) by mouth daily. Please make yearly appt with Dr. Marlou Porch for September for future refills. 1st attempt  . Multiple Vitamins-Minerals (PRESERVISION AREDS 2 PO) Take 1 tablet by mouth 2 (two) times daily.   Marland Kitchen MYRBETRIQ 25 MG TB24 tablet TAKE 1 TABLET BY MOUTH EVERY DAY  . omeprazole (PRILOSEC) 20 MG capsule Take 1 capsule (20 mg total) by mouth daily.  Marland Kitchen oxyCODONE-acetaminophen (PERCOCET/ROXICET) 5-325 MG tablet Take 0.5 tablets by mouth as needed for severe pain.  . ramipril (ALTACE) 10 MG capsule TAKE 1 CAPSULE (10 MG TOTAL) BY MOUTH EVERY EVENING.  Daryll Brod INSULIN SYRINGE 1ML/31G 31G X 5/16" 1 ML MISC Use as directed  . rOPINIRole (REQUIP) 1 MG tablet TAKE ONE TABLET BY MOUTH AT BEDTIME.  Marland Kitchen senna (SENOKOT) 8.6 MG tablet Take 1 tablet by mouth daily.  . verapamil (CALAN) 120 MG tablet Take 120 mg by mouth daily as needed (afib).   . verapamil (VERELAN PM) 240 MG 24 hr capsule TAKE 1 CAPSULE BY MOUTH EVERY DAY  . VITAMIN D PO Take 10,000 Units by mouth daily.   . [DISCONTINUED] pregabalin (LYRICA) 25 MG capsule Take 1 capsule (25 mg total) by mouth at bedtime.  . Cranberry 450 MG CAPS Take 900 mg by mouth 2 (two) times daily. 2 in am 1 at night (Patient not taking: Reported on 08/25/2019)   No facility-administered encounter medications on file as of 08/19/2019.     Current Diagnosis/Assessment:  Goals Addressed            This Visit's Progress   . Pharmacy Care Plan       CARE PLAN ENTRY (see longitudinal plan of care for additional care plan information)  Current Barriers:  . Chronic Disease Management support, education, and care coordination needs related to Hypertension, Hyperlipidemia, Diabetes, Osteoarthritis, and dysuria, paroxysmal atrial tachycardia   Hypertension BP Readings from Last 3 Encounters:  08/06/19 135/85  06/06/19 128/84  05/05/19 122/78   . Pharmacist Clinical Goal(s): o Over the next 90  days, patient will work with PharmD and providers to maintain BP goal <140/90 . Current regimen:   Metoprolol succinate (Toprol- XL) 16m, 1 tablet once daily   Ramipril 160m 1 capsule every evening   Verapamil 24039m1 capsule once daily  . Interventions: o Discussed diet modifications. DASH diet:  following a diet emphasizing fruits and vegetables and low-fat dairy products along with whole grains, fish, poultry, and nuts.  . Patient self care activities - Over the next 90 days, patient will: o Check BP 1 to 2 times per week, document, and provide at future appointments o Ensure daily salt intake < 2300 mg/day  Hyperlipidemia Lab Results  Component Value Date/Time   LDLCALC 82 01/20/2016 12:00 AM   . Pharmacist Clinical Goal(s): o Over the next 90 days, patient will work with PharmD and providers to maintain LDL goal < 100 . Current regimen:  o Ezetimibe 39m71m tablet once daily  . Interventions: o How a diet high in plant sterols (fruits/vegetables/nuts/whole grains/legumes) may  reduce your cholesterol.  Encouraged increasing fiber to a daily intake of 10-25g/day  . Patient self care activities - Over the next 90 days, patient will: o Continue current medications and diet modifications.   Diabetes Lab Results  Component Value Date/Time   HGBA1C 7.3 (A) 05/05/2019 10:52 AM   HGBA1C 7.6 (A) 01/03/2019 01:33 PM   HGBA1C 7.6 (H) 11/12/2017 10:29 AM   HGBA1C 6.7 (H) 09/04/2017 12:12 PM   HGBA1C 7.7 01/20/2016 12:00 AM   . Pharmacist Clinical Goal(s): o Over the next 90 days, patient will work with PharmD and providers to achieve A1c goal <7% or per your endocrinologist.  . Current regimen:   Insulin aspart (Novolog Flexpen), inject 8 units daily with breakfast, 10 units with lunch, 10 units with supper (max daily: 50 units per correction scale)   Insulin glargine (Lantus Solostar), inject 20 units once daily  . Interventions: o We discussed: how to recognize and treat  signs of hypoglycemia . Maintain a low carbohydrate diet, eating 45 grams of carbohydrates per meal (3 servings of carbohydrates per meal), 15 grams of carbohydrates per snack (1 serving of carbohydrate per snack).  . Patient self care activities - Over the next 90 days, patient will: o Check blood sugar as instructed, document, and provide at future appointments o Contact provider with any episodes of hypoglycemia  Osteoarthritis . Pharmacist Clinical Goal(s) o Over the next 90 days, patient will work with PharmD and providers to minimize pain level.  . Current regimen:  . Meloxicam 74m, 1 tablet once daily (takes with breakfast) . Oxycontin 5/3283m 0.5 tablet  as needed (more severe pain)  . APAP 32555m1 to 2 tablets every six hours as needed . Interventions: o The maximum daily dose of acetaminophen was discussed with the patient. She was encouraged not to exceed 3,000 mg of acetaminophen during a 24 hour period and was asked to keep in mind that acetaminophen can also be found in many over-the-counter cold medications as well as narcotics that may be given for pain.  . Patient self care activities - Over the next 90 days, patient will: o Discuss patient management with Dr. RauAdam Phenixther options for pain: celecoxib (Celebrex)).   Dysuria . Pharmacist Clinical Goal(s) o Over the next 90 days, patient will work with PharmD and providers to minimize urination difficulties.  . Current regimen:  o mirabegron (Myrbetriq) 15m48m tablet once daily  . Interventions: o Discussed possibility of dose increase of mirabegron to 50mg58m PatMarland Kitchenent self care activities o Patient will discuss further with Dr. JordaMartiniqueext visit.   Paroxysmal atrial tachycardia . Pharmacist Clinical Goal(s) o Over the next 90 days, patient will work with PharmD and providers to maintain normal sinus rhythm.  . Current regimen:   Digoxin 0.115mg,50mablet one daily   Metoprolol succinate (Toprol- XL) 15mg, 41mablet once daily   Verapamil 120mg, 168mlet as needed    Verapamil 240mg, 1 59mule once daily  . Patient self care activities o Patient will continue current medications and follow up visits with Dr. Skains.  Marlou Porchation management . Pharmacist Clinical Goal(s): o Over the next 90 days, patient will work with PharmD and providers to maintain optimal medication adherence . [facility] . Interventions o Comprehensive medication review performed. o Continue current medication management strategy . Patient self care activities - Over the next 90 days, patient will: o Take medications as prescribed o Report any questions or concerns to  PharmD and/or provider(s)  Initial goal documentation       SDOH Interventions     Most Recent Value  SDOH Interventions  Financial Strain Interventions Intervention Not Indicated  Transportation Interventions Intervention Not Indicated       Paroxysmal atrial tachycardia   Daughter reports patient has been having these all of her life. Possibly related to stress. Currently, they state it is well controlled.  Daughter reports she is living with patient  about 6-8 months and only has had 1 episodes where she had to take an additional dose of verapamil.   Digoxin Level  Date Value Ref Range Status  01/22/2018 0.5 (L) 0.8 - 2.0 ng/mL Final    Comment:    Performed at [facility], [place] [address] [place], [place] [address]  02/14/2015 <0.2 (L) 0.8 - 2.0 ng/mL Final    Comment:    RESULTS CONFIRMED BY MANUAL DILUTION  05/07/2012 0.5 (L) 0.8 - 2.0 ng/mL Final   Patient has failed these meds in past: none   Patient is currently controlled on the following medications:   Digoxin 0.176m, 1 tablet one daily   Metoprolol succinate (Toprol- XL) 273m 1 tablet once daily   Verapamil 12036m1 tablet as needed    Verapamil 240m66m capsule once daily   Plan Managed by Dr. SkaiMarlou Porcharly follow up visits)  Continue current  medications  Diabetes   Patient reports going to dietician classes.  Daughter reports never being low 100.  Will get sx when BGs are in 90s and will eat potato chips and dip to correct BGs. Snacks every day. Once every 3-4 weeks will snack late at night.  Patient and daughter report patient eats sugar since she reports that gives her energy due to fibromyalgia.   Caregivers check BGs TID and write it down on notebook for daughter.   Recent Relevant Labs: Lab Results  Component Value Date/Time   HGBA1C 7.3 (A) 05/05/2019 10:52 AM   HGBA1C 7.6 (A) 01/03/2019 01:33 PM   HGBA1C 7.6 (H) 11/12/2017 10:29 AM   HGBA1C 6.7 (H) 09/04/2017 12:12 PM   HGBA1C 7.7 01/20/2016 12:00 AM   MICROALBUR 26.1 (H) 01/12/2017 11:42 AM   MICROALBUR 118.07 01/20/2016 12:00 AM     Checking BG: Before meals- Has Dexcom G6   Per daughter,  Average BG: 212.   Last 2 times: was in range 70%.  Now in range: 20% of the time  Recent FBG Readings: 166 (reports has been climbing a bit more over last few weeks)  Recent pre-meal BG readings: 150, 170  Patient has failed these meds in past: metformin (was on it for 15-18 years)   Patient is currently sub-optimally controlled on the following medications:   Insulin aspart (Novolog Flexpen), inject 8 units daily with breakfast, 10 units with lunch, 10 units with supper (max daily: 50 units per correction scale)   Insulin glargine (Lantus Solostar), inject 20 units once daily   Last diabetic Eye exam:  Lab Results  Component Value Date/Time   HMDIABEYEEXA Retinopathy (A) 09/17/2017 12:00 AM    Last diabetic Foot exam: No results found for: HMDIABFOOTEX  - inspected by daughter every 1 to 2 weeks   We discussed:   how to recognize and treat signs of hypoglycemia  . Maintain a low carbohydrate diet, eating 45 grams of carbohydrates per meal (3 servings of carbohydrates per meal), 15 grams of carbohydrates per snack (1 serving of carbohydrate per snack).  Plan Managed by endo (every 3 months). Gets A1c every 3 months.  Continue current medications and control with diet and exercise   Hypertension   Per daugher, [place] sees pt every 3 months and goes to doc visit every month. BP affected by pain and anxiety.   Diet and exercise consists of red meat, bacon (breakfast) and carbonated sodas   Office blood pressures are  BP Readings from Last 3 Encounters:  08/06/19 135/85  06/06/19 128/84  05/05/19 122/78   Patient has failed these meds in the past: lisinopril (itching, swellling)   Patient checks BP at home infrequently  Patient home BP readings are ranging: 130-140/80s  Patient is controlled on:   Metoprolol succinate (Toprol- XL) 6m, 1 tablet once daily   Ramipril 12m 1 capsule every evening   Verapamil 24054m1 capsule once daily   We discussed diet and exercise extensively  Discussed diet modifications. DASH diet:  following a diet emphasizing fruits and vegetables and low-fat dairy products along with whole grains, fish, poultry, and nuts.   Plan Managed Dr. SkaMarlou Porchontinue current medications     Hyperlipidemia   LDL goal < 100   Lipid Panel     Component Value Date/Time   CHOL 151 01/20/2016 0000   TRIG 438 (A) 01/20/2016 0000   HDL 27 (A) 01/20/2016 0000   LDLCALC 82 01/20/2016 0000    Hepatic Function Latest Ref Rng & Units 05/04/2017 01/20/2016 02/16/2015  Total Protein 6.0 - 8.3 g/dL 6.8 - 7.0  Albumin 3.5 - 5.2 g/dL 4.2 - 4.2  AST 0 - 37 U/L 16 23 27   ALT 0 - 35 U/L 15 28 20   Alk Phosphatase 39 - 117 U/L 98 - 59  Total Bilirubin 0.2 - 1.2 mg/dL 0.6 - 1.1  Bilirubin, Direct 0.1 - 0.5 mg/dL - - -     The ASCVD Risk score (GofHurricaneet al., 2013) failed to calculate for the following reasons:   Cannot find a previous HDL lab   Cannot find a previous total cholesterol lab   Patient has failed these meds in past: statins (intolerant)  Patient is currently uncontrolled on the following  medications:  . Ezetimibe 75m31m tablet once daily   We discussed:  diet and exercise extensively  . How a diet high in plant sterols (fruits/vegetables/nuts/whole grains/legumes) may reduce your cholesterol.  Encouraged increasing fiber to a daily intake of 10-25g/day   Plan Managed by Dr. SkaiMarlou Porchs told sees yearly). Recommend scheduling AWV (due and to obtain lipid panel for further assessment)  Continue current medications  Aspirin therapy  Per daughter patient had cardiac cath by Dr. TillWynonia Lawmanut 7 years ago. Denies MI. Reports was told by Dr. TillWynonia Lawmantake it. Has diagnosis of demand ischemia.    Patient is currently controlled on the following medications:  . Aspirin 81mg37mablet at bedtime    Plan Continue current medications  Arthritis  Reports working with rehanJayme Cloudpain with Dr. RaulkAdam Phenixughter reports patient had a home xray where it was found patient has arthritis. Patient saw Dr. RogerStann Mainlandgave her a cortisone injection and recommended orthotics in left foot. Daughter also reports patient receiving toradol injection at home and had another in home xray.   Daughter reports she restarted meloxicam even thought being told by Dr. RaulkAdam Phenix it is not meant to be taken daily due to risk of stomach ulcers. Daughter reports she wants patient to continue on meloxicam since  that is the only thing that helps with pain and inflammation.   Daughter reports this is the only thing that helps with pain and inflammation. And has flare ups 3x/ month with last one reported 6 weeks ago. Daughter reports patient does go to PT.    Kidney Function Lab Results  Component Value Date/Time   CREATININE 0.89 01/08/2019 12:19 PM   CREATININE 0.94 08/23/2018 01:27 PM   CREATININE 0.96 (H) 10/19/2017 04:02 PM   CREATININE 0.97 (H) 11/10/2016 04:09 PM   GFR 57.90 (L) 08/23/2018 01:27 PM   GFRNONAA >60 01/08/2019 12:19 PM   GFRAA >60 01/08/2019 12:19 PM   K 4.4 01/08/2019 12:19 PM    K 4.3 08/23/2018 01:27 PM   Patient has failed these meds in past: hydrocodone/ APAP  Patient is currently controlled on the following medications:  Marland Kitchen Meloxicam 27m, 1 tablet once daily (takes with breakfast) . Oxycontin 5/3261m 0.5 tablet  as needed (more severe pain)  . APAP 32546m1 to 2 tablets every six hours as needed  We discussed:    The maximum daily dose of acetaminophen was discussed with the patient. She was encouraged not to exceed 3,000 mg of acetaminophen during a 24 hour period and was asked to keep in mind that acetaminophen can also be found in many over-the-counter cold medications as well as narcotics that may be given for pain. The patient and daughter express understanding of these issues and questions were answered.  NSAIDs in older adults and to discuss with Dr. RauAdam Phenix other pain management options  Plan Continue current medications  Managed by Dr. RauAdam Phenix Fibromyalgia    Patient is currently controlled on the following medications:  . Duloxetine 27m15m capsule once daily  . pregabalin 25mg34mcapsule at bedtime (daughter reports will increase to 50mg 53mr 2 weeks per Dr. RaulkeAdam Phenixan Continue current medications  RLS   Daughter reports patient has been taking this since 2018 and no complaints of RLS sx.   Patient is currently controlled on the following medications:  . Ropinirole 1 mg, 1 tablet at bedtime for RLS   Plan Continue current medications  GERD   Daughter reports control overall in sx, but is concerned since patient drinks about 4 carbonated sodas/ day.   Patient is currently controlled on the following medications:  . Omeprazole 20mg, 57mpsule once daily   We discussed:   . Discussed non-pharmacological interventions for acid reflux. Take measures to prevent acid reflux, such as avoiding spicy foods, avoiding caffeine, avoid laying down a few hours after eating, and raising the head of the bed.  Plan Continue current  medications  Constipation   Patient is currently controlled on the following medications:  . Docusate 100mg, 164msule once daily  . Senna 8.6mg, 1 t12met at night    Plan Continue current medications  Dysuria  Daughter reports patient gets up about 4 times when sleeping (sleeps at 8pm and wakes up at 9am).   Patient is currently uncontrolled on the following medications:  . mirabegron (Myrbetriq) 25mg, 1 t86mt once daily   We discussed: dose increase in mirabegron dose to 50mg. (pat67m would like to see how patient does in a month and discuss with Dr. Jordan)  PlMartiniqueommend dose increase of mirabegron to 50mg daily [facility]   Last DEXA Scan: 10/20/2008 (unable to access scores).   VITD  Date Value Ref Range Status  08/23/2018 62.77 30.00 - 100.00 ng/mL  Final    Patient is currently controlled on the following medications:  Marland Kitchen Vitamin D, 1000 units once daily (every morning)  . Calcium carbonate- vitamin D (600-283m), 1 tablet at bedtime  We discussed:  Recommend [phone] units of vitamin D daily. Recommend 1200 mg of calcium daily from dietary and supplemental sources.  Plan Continue current medications  OTC/ supplements   Patient is currently  on the following medications:  . Preservision Areds 2, 1 tablet twice daily   Medication Management  Patient organizes medications: daughter helps manage pill box and picks up meds from pharmacy  [facility]  Adherence: no gaps in refill history (per medication dispense history from 02/20/19 to 08/19/19)    Follow up Follow up visit with PharmD in 3 months.    AAnson Crofts PharmD Clinical Pharmacist [facility]([phone]

## 2019-08-21 DIAGNOSIS — M4804 Spinal stenosis, thoracic region: Secondary | ICD-10-CM | POA: Diagnosis not present

## 2019-08-22 ENCOUNTER — Other Ambulatory Visit: Payer: Self-pay | Admitting: Physical Medicine and Rehabilitation

## 2019-08-22 ENCOUNTER — Telehealth: Payer: Self-pay | Admitting: *Deleted

## 2019-08-22 MED ORDER — PREGABALIN 25 MG PO CAPS: 25.0000 mg | ORAL_CAPSULE | Freq: Two times a day (BID) | ORAL | 3 refills | Status: DC

## 2019-08-22 NOTE — Telephone Encounter (Signed)
[name] daughter of Leah Kleman called because they were told Leah Fede could start taking her pregabalin 25 mg bid and is supposed to start today but they do not have enough medication.  She is needing a new refill with the increased dosing orders.

## 2019-08-25 ENCOUNTER — Other Ambulatory Visit: Payer: Self-pay

## 2019-08-25 DIAGNOSIS — I10 Essential (primary) hypertension: Secondary | ICD-10-CM

## 2019-08-25 DIAGNOSIS — I471 Supraventricular tachycardia: Secondary | ICD-10-CM

## 2019-08-25 NOTE — Patient Instructions (Addendum)
Visit Information  Goals Addressed            This Visit's Progress   . Pharmacy Care Plan       CARE PLAN ENTRY (see longitudinal plan of care for additional care plan information)  Current Barriers:  . Chronic Disease Management support, education, and care coordination needs related to Hypertension, Hyperlipidemia, Diabetes, Osteoarthritis, and dysuria, paroxysmal atrial tachycardia   Hypertension BP Readings from Last 3 Encounters:  08/06/19 135/85  06/06/19 128/84  05/05/19 122/78   . Pharmacist Clinical Goal(s): o Over the next 90 days, patient will work with PharmD and providers to maintain BP goal <140/90 . Current regimen:   Metoprolol succinate (Toprol- XL) 25mg , 1 tablet once daily   Ramipril 10mg , 1 capsule every evening   Verapamil 240mg , 1 capsule once daily  . Interventions: o Discussed diet modifications. DASH diet:  following a diet emphasizing fruits and vegetables and low-fat dairy products along with whole grains, fish, poultry, and nuts.  . Patient self care activities - Over the next 90 days, patient will: o Check BP 1 to 2 times per week, document, and provide at future appointments o Ensure daily salt intake < 2300 mg/day  Hyperlipidemia Lab Results  Component Value Date/Time   LDLCALC 82 01/20/2016 12:00 AM   . Pharmacist Clinical Goal(s): o Over the next 90 days, patient will work with PharmD and providers to maintain LDL goal < 100 . Current regimen:  o Ezetimibe 10mg , 1 tablet once daily  . Interventions: o How a diet high in plant sterols (fruits/vegetables/nuts/whole grains/legumes) may reduce your cholesterol.  Encouraged increasing fiber to a daily intake of 10-25g/day  . Patient self care activities - Over the next 90 days, patient will: o Continue current medications and diet modifications.   Diabetes Lab Results  Component Value Date/Time   HGBA1C 7.3 (A) 05/05/2019 10:52 AM   HGBA1C 7.6 (A) 01/03/2019 01:33 PM   HGBA1C 7.6 (H)  11/12/2017 10:29 AM   HGBA1C 6.7 (H) 09/04/2017 12:12 PM   HGBA1C 7.7 01/20/2016 12:00 AM   . Pharmacist Clinical Goal(s): o Over the next 90 days, patient will work with PharmD and providers to achieve A1c goal <7% or per your endocrinologist.  . Current regimen:   Insulin aspart (Novolog Flexpen), inject 8 units daily with breakfast, 10 units with lunch, 10 units with supper (max daily: 50 units per correction scale)   Insulin glargine (Lantus Solostar), inject 20 units once daily  . Interventions: o We discussed: how to recognize and treat signs of hypoglycemia . Maintain a low carbohydrate diet, eating 45 grams of carbohydrates per meal (3 servings of carbohydrates per meal), 15 grams of carbohydrates per snack (1 serving of carbohydrate per snack).  . Patient self care activities - Over the next 90 days, patient will: o Check blood sugar as instructed, document, and provide at future appointments o Contact provider with any episodes of hypoglycemia  Osteoarthritis . Pharmacist Clinical Goal(s) o Over the next 90 days, patient will work with PharmD and providers to minimize pain level.  . Current regimen:  . Meloxicam 15mg , 1 tablet once daily (takes with breakfast) . Oxycontin 5/325mg , 0.5 tablet  as needed (more severe pain)  . APAP 325mg , 1 to 2 tablets every six hours as needed . Interventions: o The maximum daily dose of acetaminophen was discussed with the patient. She was encouraged not to exceed 3,000 mg of acetaminophen during a 24 hour period and was asked to keep  in mind that acetaminophen can also be found in many over-the-counter cold medications as well as narcotics that may be given for pain.  . Patient self care activities - Over the next 90 days, patient will: o Discuss patient management with Dr. Adam Phenix (other options for pain: celecoxib (Celebrex)).   Dysuria . Pharmacist Clinical Goal(s) o Over the next 90 days, patient will work with PharmD and providers to  minimize urination difficulties.  . Current regimen:  o mirabegron (Myrbetriq) 25mg , 1 tablet once daily  . Interventions: o Discussed possibility of dose increase of mirabegron to 50mg .  . Patient self care activities o Patient will discuss further with Dr. [name] at next visit.   Paroxysmal atrial tachycardia . Pharmacist Clinical Goal(s) o Over the next 90 days, patient will work with PharmD and providers to maintain normal sinus rhythm.  . Current regimen:   Digoxin 0.125mg , 1 tablet one daily   Metoprolol succinate (Toprol- XL) 25mg , 1 tablet once daily   Verapamil 120mg , 1 tablet as needed    Verapamil 240mg , 1 capsule once daily  . Patient self care activities o Patient will continue current medications and follow up visits with Dr. Marlou Porch.   Medication management . Pharmacist Clinical Goal(s): o Over the next 90 days, patient will work with PharmD and providers to maintain optimal medication adherence . Current pharmacy: [facility] . Interventions o Comprehensive medication review performed. o Continue current medication management strategy . Patient self care activities - Over the next 90 days, patient will: o Take medications as prescribed o Report any questions or concerns to PharmD and/or provider(s)  Initial goal documentation        [name] was given information about Chronic Care Management services today including:  1. CCM service includes personalized support from designated clinical staff supervised by her physician, including individualized plan of care and coordination with other care providers 2. 24/7 contact phone numbers for assistance for urgent and routine care needs. 3. Standard insurance, coinsurance, copays and deductibles apply for chronic care management only during months in which we provide at least 20 minutes of these services. Most insurances cover these services at 100%, however patients may be responsible for any copay, coinsurance and/or  deductible if applicable. This service may help you avoid the need for more expensive face-to-face services. 4. Only one practitioner may furnish and bill the service in a calendar month. 5. The patient may stop CCM services at any time (effective at the end of the month) by phone call to the office staff.  Patient agreed to services and verbal consent obtained.   The patient verbalized understanding of instructions provided today and agreed to receive a mailed copy of patient instruction and/or educational materials. Telephone follow up appointment with pharmacy team member scheduled for: 12/03/2019  Anson Crofts, PharmD Clinical Pharmacist [facility] [place] [phone]   Diabetes Mellitus and Nutrition, Adult When you have diabetes (diabetes mellitus), it is very important to have healthy eating habits because your blood sugar (glucose) levels are greatly affected by what you eat and drink. Eating healthy foods in the appropriate amounts, at about the same times every day, can help you:  Control your blood glucose.  Lower your risk of heart disease.  Improve your blood pressure.  Reach or maintain a healthy weight. Every person with diabetes is different, and each person has different needs for a meal plan. Your health care provider may recommend that you work with a diet and nutrition  specialist (dietitian) to make a meal plan that is best for you. Your meal plan may vary depending on factors such as:  The calories you need.  The medicines you take.  Your weight.  Your blood glucose, blood pressure, and cholesterol levels.  Your activity level.  Other health conditions you have, such as heart or kidney disease. How do carbohydrates affect me? Carbohydrates, also called carbs, affect your blood glucose level more than any other type of food. Eating carbs naturally raises the amount of glucose in your blood. Carb counting is a method for keeping  track of how many carbs you eat. Counting carbs is important to keep your blood glucose at a healthy level, especially if you use insulin or take certain oral diabetes medicines. It is important to know how many carbs you can safely have in each meal. This is different for every person. Your dietitian can help you calculate how many carbs you should have at each meal and for each snack. Foods that contain carbs include:  Bread, cereal, rice, pasta, and crackers.  Potatoes and corn.  Peas, beans, and lentils.  Milk and yogurt.  Fruit and juice.  Desserts, such as cakes, cookies, ice cream, and candy. How does alcohol affect me? Alcohol can cause a sudden decrease in blood glucose (hypoglycemia), especially if you use insulin or take certain oral diabetes medicines. Hypoglycemia can be a life-threatening condition. Symptoms of hypoglycemia (sleepiness, dizziness, and confusion) are similar to symptoms of having too much alcohol. If your health care provider says that alcohol is safe for you, follow these guidelines:  Limit alcohol intake to no more than 1 drink per day for nonpregnant women and 2 drinks per day for men. One drink equals 12 oz of beer, 5 oz of wine, or 1 oz of hard liquor.  Do not drink on an empty stomach.  Keep yourself hydrated with water, diet soda, or unsweetened iced tea.  Keep in mind that regular soda, juice, and other mixers may contain a lot of sugar and must be counted as carbs. What are tips for following this plan?  Reading food labels  Start by checking the serving size on the "Nutrition Facts" label of packaged foods and drinks. The amount of calories, carbs, fats, and other nutrients listed on the label is based on one serving of the item. Many items contain more than one serving per package.  Check the total grams (g) of carbs in one serving. You can calculate the number of servings of carbs in one serving by dividing the total carbs by 15. For example,  if a food has 30 g of total carbs, it would be equal to 2 servings of carbs.  Check the number of grams (g) of saturated and trans fats in one serving. Choose foods that have low or no amount of these fats.  Check the number of milligrams (mg) of salt (sodium) in one serving. Most people should limit total sodium intake to less than 2,300 mg per day.  Always check the nutrition information of foods labeled as "low-fat" or "nonfat". These foods may be higher in added sugar or refined carbs and should be avoided.  Talk to your dietitian to identify your daily goals for nutrients listed on the label. Shopping  Avoid buying canned, premade, or processed foods. These foods tend to be high in fat, sodium, and added sugar.  Shop around the outside edge of the grocery store. This includes fresh fruits and vegetables, bulk grains,  fresh meats, and fresh dairy. Cooking  Use low-heat cooking methods, such as baking, instead of high-heat cooking methods like deep frying.  Cook using healthy oils, such as olive, canola, or sunflower oil.  Avoid cooking with butter, cream, or high-fat meats. Meal planning  Eat meals and snacks regularly, preferably at the same times every day. Avoid going long periods of time without eating.  Eat foods high in fiber, such as fresh fruits, vegetables, beans, and whole grains. Talk to your dietitian about how many servings of carbs you can eat at each meal.  Eat 4-6 ounces (oz) of lean protein each day, such as lean meat, chicken, fish, eggs, or tofu. One oz of lean protein is equal to: ? 1 oz of meat, chicken, or fish. ? 1 egg. ?  cup of tofu.  Eat some foods each day that contain healthy fats, such as avocado, nuts, seeds, and fish. Lifestyle  Check your blood glucose regularly.  Exercise regularly as told by your health care provider. This may include: ? 150 minutes of moderate-intensity or vigorous-intensity exercise each week. This could be brisk walking,  biking, or water aerobics. ? Stretching and doing strength exercises, such as yoga or weightlifting, at least 2 times a week.  Take medicines as told by your health care provider.  Do not use any products that contain nicotine or tobacco, such as cigarettes and e-cigarettes. If you need help quitting, ask your health care provider.  Work with a Social worker or diabetes educator to identify strategies to manage stress and any emotional and social challenges. Questions to ask a health care provider  Do I need to meet with a diabetes educator?  Do I need to meet with a dietitian?  What number can I call if I have questions?  When are the best times to check my blood glucose? Where to find more information:  American Diabetes Association: diabetes.org  Academy of Nutrition and Dietetics: www.eatright.[employer] of Diabetes and Digestive and Kidney Diseases (NIH): [employer] Summary  A healthy meal plan will help you control your blood glucose and maintain a healthy lifestyle.  Working with a diet and nutrition specialist (dietitian) can help you make a meal plan that is best for you.  Keep in mind that carbohydrates (carbs) and alcohol have immediate effects on your blood glucose levels. It is important to count carbs and to use alcohol carefully. This information is not intended to replace advice given to you by your health care provider. Make sure you discuss any questions you have with your health care provider. Document Revised: 01/12/2017 Document Reviewed: 03/06/2016 Elsevier Patient Education  2020 [employer].

## 2019-08-28 DIAGNOSIS — M4804 Spinal stenosis, thoracic region: Secondary | ICD-10-CM | POA: Diagnosis not present

## 2019-09-02 DIAGNOSIS — M4804 Spinal stenosis, thoracic region: Secondary | ICD-10-CM | POA: Diagnosis not present

## 2019-09-03 ENCOUNTER — Other Ambulatory Visit: Payer: Self-pay

## 2019-09-03 ENCOUNTER — Encounter: Payer: Self-pay | Admitting: Internal Medicine

## 2019-09-03 ENCOUNTER — Ambulatory Visit: Payer: PPO | Admitting: Internal Medicine

## 2019-09-03 VITALS — BP 144/92 | HR 76 | Ht 61.0 in | Wt 202.0 lb

## 2019-09-03 DIAGNOSIS — R300 Dysuria: Secondary | ICD-10-CM | POA: Diagnosis not present

## 2019-09-03 DIAGNOSIS — Z794 Long term (current) use of insulin: Secondary | ICD-10-CM

## 2019-09-03 DIAGNOSIS — E1142 Type 2 diabetes mellitus with diabetic polyneuropathy: Secondary | ICD-10-CM

## 2019-09-03 DIAGNOSIS — E119 Type 2 diabetes mellitus without complications: Secondary | ICD-10-CM

## 2019-09-03 DIAGNOSIS — R5383 Other fatigue: Secondary | ICD-10-CM

## 2019-09-03 LAB — BASIC METABOLIC PANEL
BUN: 27 mg/dL — ABNORMAL HIGH (ref 6–23)
CO2: 26 mEq/L (ref 19–32)
Calcium: 9.9 mg/dL (ref 8.4–10.5)
Chloride: 101 mEq/L (ref 96–112)
Creatinine, Ser: 1.14 mg/dL (ref 0.40–1.20)
GFR: 46.21 mL/min — ABNORMAL LOW (ref >60.00)
Glucose, Bld: 152 mg/dL — ABNORMAL HIGH (ref 70–99)
Potassium: 4.5 mEq/L (ref 3.5–5.1)
Sodium: 135 mEq/L (ref 135–145)

## 2019-09-03 LAB — URINALYSIS, ROUTINE W REFLEX MICROSCOPIC
Bilirubin Urine: NEGATIVE
Hgb urine dipstick: NEGATIVE
Ketones, ur: NEGATIVE
Leukocytes,Ua: NEGATIVE
Nitrite: NEGATIVE
RBC / HPF: NONE SEEN (ref 0–?)
Specific Gravity, Urine: 1.025 (ref 1.000–1.030)
Total Protein, Urine: 30 — AB
Urine Glucose: NEGATIVE
Urobilinogen, UA: 0.2 (ref 0.0–1.0)
pH: 5.5 (ref 5.0–8.0)

## 2019-09-03 LAB — POCT GLYCOSYLATED HEMOGLOBIN (HGB A1C): Hemoglobin A1C: 7.7 % — AB (ref 4.0–5.6)

## 2019-09-03 LAB — VITAMIN B12: Vitamin B-12: 190 pg/mL — ABNORMAL LOW (ref 211–911)

## 2019-09-03 LAB — MICROALBUMIN / CREATININE URINE RATIO
Creatinine,U: 155.4 mg/dL
Microalb Creat Ratio: 15.4 mg/g (ref 0.0–30.0)
Microalb, Ur: 23.9 mg/dL — ABNORMAL HIGH (ref 0.0–1.9)

## 2019-09-03 LAB — TSH: TSH: 1.25 u[IU]/mL (ref 0.35–4.50)

## 2019-09-03 LAB — VITAMIN D 25 HYDROXY (VIT D DEFICIENCY, FRACTURES): VITD: 66.83 ng/mL (ref 30.00–100.00)

## 2019-09-03 NOTE — Progress Notes (Signed)
Name: [name]  Age/ Sex: 79 y.o., female   MRN/ DOB: [id number], 23-Mar-1940     PCP: [name], [name], MD   Reason for Endocrinology Evaluation: Type 2 diabetes Mellitus  Initial Endocrine Consultative Visit:  12/28/2017    PATIENT IDENTIFIER: [name] is a 79 y.o. female with a past medical history of PAT, T2 DM, and urinary incontinence, Hx of pancreatitis. The patient has followed with Endocrinology clinic since 12/28/2017 for consultative assistance with management of her diabetes.  DIABETIC HISTORY:  [name] was diagnosed with T2DM > 10 years ago.  She is intolerant to metformin due to fecal incontinence on 2000 mg a day. Her hemoglobin A1c has ranged from 6.7% in 09/01/2017, peaking at 7.9% in 2011.   SUBJECTIVE:   During the last visit (05/05/2019): Her A1c was 7.3 %, we continued lantus and Novolog doses.   Today (09/03/2019): [name] is here for a 4 month follow up for diabetes management. She is with her daughter [name].  She checks her blood sugars multiple  times daily through the Dexcom . The patient has not had hypoglycemic episodes since the last clinic visit.    Has dysuria for the past 2 months, no fever  Has hx of  recurrent UTI's   On meloxicam for left foot pain  Continues with botox infection of hands - not helping  Daughter states she has a genital rash but has been responding to OTC treatment.      HOME DIABETES REGIMEN:  Lantus 20 units daily  Novolog 8 units with breakfast , 10 units with lunch and supper CF: NovoLog (BG -130/35)    CONTINUOUS GLUCOSE MONITORING RECORD INTERPRETATION    Dates of Recording: 7/8- 09/03/2019  Sensor description:Dexcom G6  Results statistics:   CGM use % of time 93  Average and SD 172/28  Time in range  68%  % Time Above 180 31  % Time above 250  < 1.0  % Time Below target 0    Glycemic patterns summary: Hyperglycemia  During the day mainly with breakfast and supper  Hyperglycemic  episodes  As above   Hypoglycemic episodes occurred N/A  Overnight periods: Stable    DIABETIC COMPLICATIONS: Microvascular complications:   Neuropathy   Denies: retinopathy, nephropathy   Last eye exam: Completed 06/2019  Macrovascular complications:    Denies: CAD, PVD, CVA   HISTORY:  Past Medical History:  Past Medical History:  Diagnosis Date  . Anemia    during 1st pregnancy  . Anxiety   . Arthritis   . Cancer ([place])    Basal - face  . Complication of anesthesia   . Depression   . Diarrhea, functional   . Dysrhythmia    Paroysmal Atrial Tachycardia  . Essential hypertension   . Fibromyalgia   . GERD (gastroesophageal reflux disease)   . H/O acute pancreatitis   . Headache   . History of hiatal hernia   . Hyperlipemia   . Neuropathy   . Osteoporosis   . Panic attacks   . Paralysis ([place]) 01/06/2019   hands  . PAT (paroxysmal atrial tachycardia) ([place])   . Pneumonia   . PONV (postoperative nausea and vomiting)   . PSVT (paroxysmal supraventricular tachycardia) ([place])   . Reflux   . Restless leg syndrome   . Spinal cord compression ([place])   . Type 2 diabetes mellitus (HCC)    Type II  . Vertigo  not current   Past Surgical History:  Past Surgical History:  Procedure Laterality Date  . ABDOMINAL HYSTERECTOMY    . ANKLE SURGERY Left    x 2  . ANTERIOR CERVICAL DECOMP/DISCECTOMY FUSION N/A 11/12/2017   Procedure: Cervical seven to Thoracic one  Anterior cervical decompression/discectomy/fusion with removal of old plate;  Surgeon: [name], MD;  Location: [facility];  Service: Neurosurgery;  Laterality: N/A;  . BACK SURGERY     T12-L1 fusion, Anterior Cervical fusion  . CARDIAC CATHETERIZATION N/A 02/16/2015   Procedure: Left Heart Cath and Coronary Angiography;  Surgeon: Sherren Mocha, MD;  Location: [facility];  Service: Cardiovascular;  Laterality: N/A;  . CHOLECYSTECTOMY    . COLONOSCOPY    . ESOPHAGOGASTRODUODENOSCOPY  12/21/2010    Procedure: ESOPHAGOGASTRODUODENOSCOPY (EGD);  Surgeon: Landry Dyke, MD;  Location: [facility];  Service: Endoscopy;  Laterality: N/A;  . EXCISION MORTON'S NEUROMA Right   . EYE SURGERY Bilateral    Cataract  . FRACTURE SURGERY     ankle x 2   . KNEE SURGERY Right    x 3- arthroscopy  . NECK SURGERY    . POSTERIOR CERVICAL FUSION/FORAMINOTOMY N/A 01/22/2018   Procedure: Cervical six-seven Posterior Decompression with Posterior Fixation Cervical Five to Thoracic Two;  Surgeon: [name], MD;  Location: [facility];  Service: Neurosurgery;  Laterality: N/A;  Cervical 6-7 Posteror decompression with posterior fixation Cervical 5 to Thoracic 2  . ROTATOR CUFF REPAIR Right   . TONSILLECTOMY    . TOTAL KNEE ARTHROPLASTY Left 04/10/2017   Procedure: LEFT TOTAL KNEE ARTHROPLASTY;  Surgeon: Paralee Cancel, MD;  Location: [facility];  Service: Orthopedics;  Laterality: Left;  70 mins  . UPPER GASTROINTESTINAL ENDOSCOPY    . WRIST SURGERY Left    fracture    Social History:  reports that she has quit smoking. She has never used smokeless tobacco. She reports that she does not drink alcohol and does not use drugs. Family History:  Family History  Problem Relation Age of Onset  . Heart failure Mother   . Hyperlipidemia Mother   . Lung cancer Father        Lymphoma  . Hyperlipidemia Maternal Grandmother   . Peripheral vascular disease Maternal Grandmother      HOME MEDICATIONS: Allergies as of 09/03/2019      Reactions   Amoxil [amoxicillin] Swelling, Other (See Comments)   Lips Has patient had a PCN reaction causing immediate rash, facial/tongue/throat swelling, SOB or lightheadedness with hypotension:YES Has patient had a PCN reaction causing severe rash involving mucus membranes or skin necrosis:No Has patient had a PCN reaction that required hospitalization:No Has patient had a PCN reaction occurring within the last 10 years:Yes. If all of the above answers are "NO", then may proceed  with Cephalosporin use.   Lisinopril Itching, Swelling   SWELLING REACTION UNSPECIFIED    Chocolate Hives   Demerol Nausea And Vomiting   Other Other (See Comments)   Raw food or nuts - GI pain      Medication List       Accurate as of September 03, 2019 10:49 AM. If you have any questions, ask your nurse or doctor.        acetaminophen 325 MG tablet Commonly known as: TYLENOL Take 325 mg by mouth every 6 (six) hours as needed.   aspirin EC 81 MG tablet Take 81 mg by mouth at bedtime.   BD Pen Needle Micro U/F 32G X 6 MM  Misc Generic drug: Insulin Pen Needle 4 TIMES A DAY NOVOLOG(3) AND LANTUS9(1)   Calcium 600+D3 600-200 MG-UNIT Tabs Generic drug: Calcium Carb-Cholecalciferol Take 1 tablet by mouth in the morning and at bedtime.   Cranberry 450 MG Caps Take 900 mg by mouth daily.   Debrox 6.5 % OTIC solution Generic drug: carbamide peroxide Place 5 drops into the left ear 2 (two) times daily. What changed: additional instructions   Dexcom G6 Receiver Devi 1 Device by Does not apply route daily.   Dexcom G6 Sensor Misc 1 kit by Does not apply route as directed.   Dexcom G6 Transmitter Misc 1 kit by Does not apply route as directed.   digoxin 0.125 MG tablet Commonly known as: LANOXIN Take 1 tablet (0.125 mg total) by mouth daily. Please make yearly appt for September before anymore refills. 1st attempt   docusate sodium 100 MG capsule Commonly known as: COLACE Take 100 mg by mouth daily. twice a day   DULoxetine 30 MG capsule Commonly known as: CYMBALTA Take 2 capsules (60 mg total) by mouth daily.   ezetimibe 10 MG tablet Commonly known as: ZETIA TAKE 1 TABLET BY MOUTH EVERY DAY   glucose blood test strip Use as instructed to test blood sugars 3 times daily [id number]   Lantus SoloStar 100 UNIT/ML Solostar Pen Generic drug: insulin glargine Inject 20 Units into the skin daily.   meloxicam 15 MG tablet Commonly known as: MOBIC TAKE 1 TABLET BY MOUTH  EVERY DAY   metoprolol succinate 25 MG 24 hr tablet Commonly known as: TOPROL-XL Take 1 tablet (25 mg total) by mouth daily. Please make yearly appt with Dr. Marlou Porch for September for future refills. 1st attempt   Myrbetriq 25 MG Tb24 tablet Generic drug: mirabegron ER TAKE 1 TABLET BY MOUTH EVERY DAY   NovoLOG FlexPen 100 UNIT/ML FlexPen Generic drug: insulin aspart Inject 8 Units into the skin daily with breakfast AND 10 Units daily with lunch AND 10 Units daily with supper. Max daily 50 units per correction scale.   omeprazole 20 MG capsule Commonly known as: PRILOSEC Take 1 capsule (20 mg total) by mouth daily.   oxyCODONE-acetaminophen 5-325 MG tablet Commonly known as: PERCOCET/ROXICET Take 0.5 tablets by mouth as needed for severe pain.   pregabalin 25 MG capsule Commonly known as: Lyrica Take 1 capsule (25 mg total) by mouth 2 (two) times daily.   PRESERVISION AREDS 2 PO Take 1 tablet by mouth 2 (two) times daily.   ramipril 10 MG capsule Commonly known as: ALTACE TAKE 1 CAPSULE (10 MG TOTAL) BY MOUTH EVERY EVENING.   RELION INSULIN SYRINGE 1ML/31G 31G X 5/16" 1 ML Misc Generic drug: Insulin Syringe-Needle U-100 Use as directed   rOPINIRole 1 MG tablet Commonly known as: REQUIP TAKE ONE TABLET BY MOUTH AT BEDTIME.   senna 8.6 MG tablet Commonly known as: SENOKOT Take 1 tablet by mouth daily.   verapamil 120 MG tablet Commonly known as: CALAN Take 120 mg by mouth daily as needed (afib).   verapamil 240 MG 24 hr capsule Commonly known as: VERELAN PM TAKE 1 CAPSULE BY MOUTH EVERY DAY   VITAMIN D PO Take 10,000 Units by mouth daily.        OBJECTIVE:   Vital Signs: BP (!) 144/92 (BP Location: Right Arm, Patient Position: Sitting, Cuff Size: Normal)   Pulse 76   Ht _0  (1.549 m)   Wt 202 lb (91.6 kg)   SpO2 98%   BMI 38.17  kg/m   Wt Readings from Last 3 Encounters:  09/03/19 202 lb (91.6 kg)  08/06/19 208 lb (94.3 kg)  06/06/19 199 lb 12.8  oz (90.6 kg)     Exam: General: Pt appears well and is in NAD  Lungs: Clear with good BS bilat with no rales, rhonchi, or wheezes  Heart: RRR with normal S1 and S2 and no gallops; no murmurs; no rub  Extremities: No pretibial edema.  Neuro: MS is good with appropriate affect, pt is alert and Ox3    DM foot exam:01/03/2019 The skin of the feet is intact without sores or ulcerations. The pedal pulses are 2+ on right and 2+ on left. The sensation is decreased to a screening 5.07, 10 gram monofilament bilaterally     DATA REVIEWED:  Lab Results  Component Value Date   HGBA1C 7.7 (A) 09/03/2019   HGBA1C 7.3 (A) 05/05/2019   HGBA1C 7.6 (A) 01/03/2019   Lab Results  Component Value Date   MICROALBUR 26.1 (H) 01/12/2017   LDLCALC 82 01/20/2016   CREATININE 0.89 01/08/2019   Lab Results  Component Value Date   MICRALBCREAT 14.1 01/12/2017   Results for CHIHIRO, FREY (MRN [id number]) as of 09/04/2019 08:40  Ref. Range 09/03/2019 11:09  Sodium Latest Ref Range: 135 - 145 mEq/L 135  Potassium Latest Ref Range: 3.5 - 5.1 mEq/L 4.5  Chloride Latest Ref Range: 96 - 112 mEq/L 101  CO2 Latest Ref Range: 19 - 32 mEq/L 26  Glucose Latest Ref Range: 70 - 99 mg/dL 152 (H)  BUN Latest Ref Range: 6 - 23 mg/dL 27 (H)  Creatinine Latest Ref Range: 0.40 - 1.20 mg/dL 1.14  Calcium Latest Ref Range: 8.4 - 10.5 mg/dL 9.9  GFR Latest Ref Range: >60.00 mL/min 46.21 (L)  MICROALB/CREAT RATIO Latest Ref Range: 0.0 - 30.0 mg/g 15.4  VITD Latest Ref Range: 30.00 - 100.00 ng/mL 66.83  Vitamin B12 Latest Ref Range: 211 - 911 pg/mL 190 (L)  TSH Latest Ref Range: 0.35 - 4.50 uIU/mL 1.25  URINALYSIS, ROUTINE W REFLEX MICROSCOPIC Unknown Rpt (A)  Appearance Latest Ref Range: Clear;Turbid;Slightly Cloudy;Cloudy  CLEAR  Bilirubin Urine Latest Ref Range: Negative  NEGATIVE  Color, Urine Latest Ref Range: Yellow;Lt. Yellow;Straw;Dark Yellow;Amber;Green;Red;Brown  YELLOW  Hgb urine dipstick Latest Ref  Range: Negative  NEGATIVE  Ketones, ur Latest Ref Range: Negative  NEGATIVE  Leukocytes,Ua Latest Ref Range: Negative  NEGATIVE  Nitrite Latest Ref Range: Negative  NEGATIVE  pH Latest Ref Range: 5.0 - 8.0  5.5  Specific Gravity, Urine Latest Ref Range: 1.000 - 1.030  1.025  Urine Glucose Latest Ref Range: Negative  NEGATIVE  Urobilinogen, UA Latest Ref Range: 0.0 - 1.0  0.2  RBC / HPF Latest Ref Range: 0-2/hpf  none seen  Squamous Epithelial / LPF Latest Ref Range: Rare(0-4/hpf)  Few(5-10/hpf) (A)  WBC, UA Latest Ref Range: 0-2/hpf  0-2/hpf  Creatinine,U Latest Units: mg/dL 155.4  Microalb, Ur Latest Ref Range: 0.0 - 1.9 mg/dL 23.9 (H)  Total Protein, Urine-UPE24 Latest Ref Range: Negative  30 (A)  Amorphous Latest Ref Range: None;Present  Present (A)    ASSESSMENT / PLAN / RECOMMENDATIONS:   1) Type 2 Diabetes Mellitus, Sub-Optimally Controlled, With  neuropathic complications - Most recent A1c of 7.7 %. Goal A1c < 7.0 %.   - Slight hyperglycemia noted on CGM download, will adjust prandial insulin as below  - intolerant to metformin, has questionable hx of pancreatitis hence reluctant to start GLP-1 agonists. Not  a candidate for SGLT- 2 inhibitors due to recurrent UTI's   MEDICATIONS:  Lantus 20 units once a day  NovoLog 10 units with Breakfast and 10 units with Lunch and 12 units with  Supper   CF: (BG- 130/35)    EDUCATION / INSTRUCTIONS:  BG monitoring instructions: Patient is instructed to check her blood sugars 4 times a day, before meals and bedtime.  Call [facility] if: BG persistently < 70 or > 300. . I reviewed the Rule of 15 for the treatment of hypoglycemia in detail with the patient. Literature supplied.  2) Diabetic complications:   Eye: Does not have known diabetic retinopathy.   Neuro/ Feet: Does have known diabetic peripheral neuropathy .   Renal: Patient does not have known baseline CKD. She   is on an ACEI/ARB at  present.    3) Dysuria:  - Urine analysis came back clean.  - Discussed D/D of vaginal candidiasis vs BV. She may try OTC Monistat until she get to see her PCP in 2 weeks    4) Fatigue:  - Pt requested Vitamin D and B12 checks - Normal Vitamin D but low B12   5) Vitamin B12 deficiency:   - Pt to start OTC Vitamin B12 1000 mcg daily       F/U in 6 months    signed electronically by: Mack Guise, MD  [facility]  [facility] [address] [place], [place] [address] Phone: [phone] FAX: [phone]   CC: [name], [name], [place] [address] Phone: [phone]  Fax: [phone]  Return to Endocrinology clinic as below: Future Appointments  Date Time Provider [place]  09/10/2019  1:00 PM Raulkar, Clide Deutscher, MD [facility]  09/17/2019  7:00 AM [name], [name], MD [facility]  12/03/2019  9:00 AM [facility] CCM PHARMACIST [facility]

## 2019-09-03 NOTE — Patient Instructions (Addendum)
-   Continue Lantus 20 units ONCE a day  - Novolog 10 units with Breakfast and 10 units with Lunch and 12 units Supper  - Novolog correctional insulin: ADD extra units on insulin to your meal-time Novolog dose if your blood sugars are higher than 165. Use the scale below to help guide you:   Blood sugar before meal Number of units to inject  Less than 165 0 unit  166 -  200 1 units  201 -  235 2 units  236 -  270 3 units  271 -  305 4 units  306 -  340 5 units  341 -  375 6 units       HOW TO TREAT LOW BLOOD SUGARS (Blood sugar LESS THAN 70 MG/DL)  Please follow the RULE OF 15 for the treatment of hypoglycemia treatment (when your (blood sugars are less than 70 mg/dL)    STEP 1: Take 15 grams of carbohydrates when your blood sugar is low, which includes:   3-4 GLUCOSE TABS  OR  3-4 OZ OF JUICE OR REGULAR SODA OR  ONE TUBE OF GLUCOSE GEL     STEP 2: RECHECK blood sugar in 15 MINUTES STEP 3: If your blood sugar is still low at the 15 minute recheck --> then, go back to STEP 1 and treat AGAIN with another 15 grams of carbohydrates.

## 2019-09-04 DIAGNOSIS — M4804 Spinal stenosis, thoracic region: Secondary | ICD-10-CM | POA: Diagnosis not present

## 2019-09-04 DIAGNOSIS — Z794 Long term (current) use of insulin: Secondary | ICD-10-CM | POA: Insufficient documentation

## 2019-09-04 DIAGNOSIS — E1142 Type 2 diabetes mellitus with diabetic polyneuropathy: Secondary | ICD-10-CM | POA: Insufficient documentation

## 2019-09-04 MED ORDER — NOVOLOG FLEXPEN 100 UNIT/ML ~~LOC~~ SOPN: PEN_INJECTOR | SUBCUTANEOUS | 6 refills | Status: DC

## 2019-09-05 ENCOUNTER — Telehealth: Payer: Self-pay | Admitting: Family Medicine

## 2019-09-05 ENCOUNTER — Ambulatory Visit (INDEPENDENT_AMBULATORY_CARE_PROVIDER_SITE_OTHER)
Admission: RE | Admit: 2019-09-05 | Discharge: 2019-09-05 | Disposition: A | Discharge status: Home / Self Care | Payer: PPO | Source: Ambulatory Visit | Attending: Family Medicine | Admitting: Family Medicine

## 2019-09-05 ENCOUNTER — Other Ambulatory Visit: Payer: Self-pay

## 2019-09-05 ENCOUNTER — Encounter: Payer: Self-pay | Admitting: Family Medicine

## 2019-09-05 ENCOUNTER — Ambulatory Visit (INDEPENDENT_AMBULATORY_CARE_PROVIDER_SITE_OTHER): Payer: PPO | Admitting: Family Medicine

## 2019-09-05 VITALS — BP 136/82 | HR 74 | Temp 97.8°F | Wt 204.0 lb

## 2019-09-05 DIAGNOSIS — R5383 Other fatigue: Secondary | ICD-10-CM

## 2019-09-05 DIAGNOSIS — H66001 Acute suppurative otitis media without spontaneous rupture of ear drum, right ear: Secondary | ICD-10-CM | POA: Diagnosis not present

## 2019-09-05 DIAGNOSIS — R0602 Shortness of breath: Secondary | ICD-10-CM

## 2019-09-05 DIAGNOSIS — I10 Essential (primary) hypertension: Secondary | ICD-10-CM | POA: Diagnosis not present

## 2019-09-05 DIAGNOSIS — N761 Subacute and chronic vaginitis: Secondary | ICD-10-CM

## 2019-09-05 LAB — POC URINALSYSI DIPSTICK (AUTOMATED)
Bilirubin, UA: NEGATIVE
Blood, UA: NEGATIVE
Glucose, UA: NEGATIVE
Ketones, UA: NEGATIVE
Leukocytes, UA: NEGATIVE
Nitrite, UA: NEGATIVE
Protein, UA: POSITIVE — AB
Spec Grav, UA: >=1.03 — AB (ref 1.010–1.025)
Urobilinogen, UA: 0.2 E.U./dL
pH, UA: 6 (ref 5.0–8.0)

## 2019-09-05 MED ORDER — AZITHROMYCIN 250 MG PO TABS: ORAL_TABLET | ORAL | 0 refills | Status: DC

## 2019-09-05 NOTE — Telephone Encounter (Signed)
Pharmacist stated the azithromycin interacts with the medication Digoxin that the pt is on. It can increase the digoxin level and cause toxicity. They are wondering if she is aware of the interaction and wants to go forward or change it?      [facility] Phone: [phone]

## 2019-09-05 NOTE — Progress Notes (Signed)
Subjective:    Patient ID: [name], female    DOB: [birth year], 79 y.o.   MRN: [id number]  No chief complaint on file. Patient accompanied by her caregiver Jolene.  HPI Pt is a 79 yo female with pmh sig for paroxysmal atrial tachycardia, HTN, GERD, DM type II, cervical spondylosis with myelopathy, neuropathy, OA, depression, fibromyalgia, HLD, vitamin D deficiency, vitamin B12 deficiency followed by Dr. [name] who was seen for ongoing concerns.  Pt endorses burning/painful sensation of skin of vagina.  Pt states Valarie, her sister is a Marine scientist and did not see any rash or lesions, though using monistat x last 2 days.  Pt states her bs has been 143, 133.  It was 175 a few min ago after eating lunch.  When asked about worsening SOB, not given a clear answer.  Pt states she feels weak and overall unwell x 3 weeks.  Patient states she was told her vitamin B12 was low this morning.  Also notes her vitamin D level was 60.  Pt states this is low for a person with fibromyalgia as her level should be 80-100.  Past Medical History:  Diagnosis Date  . Anemia    during 1st pregnancy  . Anxiety   . Arthritis   . Cancer ([place])    Basal - face  . Complication of anesthesia   . Depression   . Diarrhea, functional   . Dysrhythmia    Paroysmal Atrial Tachycardia  . Essential hypertension   . Fibromyalgia   . GERD (gastroesophageal reflux disease)   . H/O acute pancreatitis   . Headache   . History of hiatal hernia   . Hyperlipemia   . Neuropathy   . Osteoporosis   . Panic attacks   . Paralysis ([place]) 01/06/2019   hands  . PAT (paroxysmal atrial tachycardia) ([place])   . Pneumonia   . PONV (postoperative nausea and vomiting)   . PSVT (paroxysmal supraventricular tachycardia) ([place])   . Reflux   . Restless leg syndrome   . Spinal cord compression ([place])   . Type 2 diabetes mellitus (HCC)    Type II  . Vertigo    not current    Allergies  Allergen Reactions  . Amoxil [Amoxicillin] Swelling  and Other (See Comments)    Lips Has patient had a PCN reaction causing immediate rash, facial/tongue/throat swelling, SOB or lightheadedness with hypotension:YES Has patient had a PCN reaction causing severe rash involving mucus membranes or skin necrosis:No Has patient had a PCN reaction that required hospitalization:No Has patient had a PCN reaction occurring within the last 10 years:Yes. If all of the above answers are "NO", then may proceed with Cephalosporin use.   Marland Kitchen Lisinopril Itching and Swelling    SWELLING REACTION UNSPECIFIED   . Chocolate Hives  . Demerol Nausea And Vomiting  . Other Other (See Comments)    Raw food or nuts - GI pain    ROS General: Denies fever, chills, night sweats, changes in weight, changes in appetite  + fatigue, unwell feeling HEENT: Denies headaches, ear pain, changes in vision, rhinorrhea, sore throat CV: Denies CP, palpitations, orthopnea  +SOB? Pulm: Denies cough, wheezing +SOB? GI: Denies abdominal pain, nausea, vomiting, diarrhea, constipation GU: Denies dysuria, hematuria, frequency, vaginal discharge  + vaginal irritation, dysuria Msk: Denies muscle cramps, joint pains Neuro: Denies weakness, numbness, tingling Skin: Denies rashes, bruising Psych: Denies depression, anxiety, hallucinations      Objective:    Blood pressure (!) 136/82, pulse 74,  temperature 97.8 F (36.6 C), temperature source Oral, weight (!) 204 lb (92.5 kg), SpO2 98 %.  Gen. Pleasant, well-nourished, appears mildly fatigued, but in no distress HEENT: [place]/AT, face symmetric, no scleral icterus, PERRLA, EOMI, nares patent without drainage, left TM normal.  Right TM full with supportive fluid and left corner Lungs: no accessory muscle use, CTAB, no wheezes or rales Cardiovascular: RRR, no m/r/g, 1+ to trace edema in ankles.  Indentations socks noted bilaterally. Abdomen: BS present, soft, NT/ND Musculoskeletal: Deformity of bilateral fingers.  No cyanosis or clubbing,  normal tone Neuro:  A&Ox3, CN II-XII intact, normal gait Skin:  Warm, no lesions/ rash   Wt Readings from Last 3 Encounters:  09/03/19 202 lb (91.6 kg)  08/06/19 208 lb (94.3 kg)  06/06/19 199 lb 12.8 oz (90.6 kg)    Lab Results  Component Value Date   WBC 10.2 01/06/2019   HGB 14.3 01/06/2019   HCT 42.2 01/06/2019   PLT 212 01/06/2019   GLUCOSE 152 (H) 09/03/2019   CHOL 151 01/20/2016   TRIG 438 (A) 01/20/2016   HDL 27 (A) 01/20/2016   LDLCALC 82 01/20/2016   ALT 15 05/04/2017   AST 16 05/04/2017   NA 135 09/03/2019   K 4.5 09/03/2019   CL 101 09/03/2019   CREATININE 1.14 09/03/2019   BUN 27 (H) 09/03/2019   CO2 26 09/03/2019   TSH 1.25 09/03/2019   INR 0.94 01/21/2018   HGBA1C 7.7 (A) 09/03/2019   MICROALBUR 23.9 (H) 09/03/2019    Assessment/Plan:  Fatigue, unspecified type  -Discussed possible causes including fibromyalgia flare, UTI, hyper or hypoglycemia etc. -UA with SG greater than 1.030 and protein -Labs from 09/03/19 reviewed -Vitamin B12 low at 190 - Plan: CBC with Differential/Platelet, D-dimer, Quantitative, D-dimer, Quantitative, CBC with Differential/Platelet  SOB (shortness of breath) -Unclear patient at her baseline. -Discussed proceeding with imaging and labs. -Per chart review BMP done 09/03/2019. - Plan: CBC with Differential/Platelet, DG Chest 2 View, Basic metabolic panel, D-dimer, Quantitative, D-dimer, Quantitative, Basic metabolic panel, CBC with Differential/Platelet  Subacute vaginitis -UA with SG greater than 1.030 and protein -Discussed the importance of increasing p.o. intake of water as concentrated urine can be irritating to the skin -Discussed using mild soaps - Plan: POCT Urinalysis Dipstick (Automated)  Non-recurrent acute suppurative otitis media of right ear without spontaneous rupture of tympanic membrane  -Allergies reviewed.  Patient has allergy to amoxicillin - Plan: azithromycin (ZITHROMAX) 250 MG tablet   F/u as  needed with PCP  Grier Mitts, MD

## 2019-09-05 NOTE — Patient Instructions (Addendum)
Fatigue If you have fatigue, you feel tired all the time and have a lack of energy or a lack of motivation. Fatigue may make it difficult to start or complete tasks because of exhaustion. In general, occasional or mild fatigue is often a normal response to activity or life. However, long-lasting (chronic) or extreme fatigue may be a symptom of a medical condition. Follow these instructions at home: General instructions  Watch your fatigue for any changes.  Go to bed and get up at the same time every day.  Avoid fatigue by pacing yourself during the day and getting enough sleep at night.  Maintain a healthy weight. Medicines  Take over-the-counter and prescription medicines only as told by your health care provider.  Take a multivitamin, if told by your health care provider.  Do not use herbal or dietary supplements unless they are approved by your health care provider. Activity   Exercise regularly, as told by your health care provider.  Use or practice techniques to help you relax, such as yoga, tai chi, meditation, or massage therapy. Eating and drinking   Avoid heavy meals in the evening.  Eat a well-balanced diet, which includes lean proteins, whole grains, plenty of fruits and vegetables, and low-fat dairy products.  Avoid consuming too much caffeine.  Avoid the use of alcohol.  Drink enough fluid to keep your urine pale yellow. Lifestyle  Change situations that cause you stress. Try to keep your work and personal schedule in balance.  Do not use any products that contain nicotine or tobacco, such as cigarettes and e-cigarettes. If you need help quitting, ask your health care provider.  Do not use drugs. Contact a health care provider if:  Your fatigue does not get better.  You have a fever.  You suddenly lose or gain weight.  You have headaches.  You have trouble falling asleep or sleeping through the night.  You feel angry, guilty, anxious, or sad.   You are unable to have a bowel movement (constipation).  Your skin is dry.  You have swelling in your legs or another part of your body. Get help right away if:  You feel confused.  Your vision is blurry.  You feel faint or you pass out.  You have a severe headache.  You have severe pain in your abdomen, your back, or the area between your waist and hips (pelvis).  You have chest pain, shortness of breath, or an irregular or fast heartbeat.  You are unable to urinate, or you urinate less than normal.  You have abnormal bleeding, such as bleeding from the rectum, vagina, nose, lungs, or nipples.  You vomit blood.  You have thoughts about hurting yourself or others. If you ever feel like you may hurt yourself or others, or have thoughts about taking your own life, get help right away. You can go to your nearest emergency department or call:  Your local emergency services (911 in the [place]  A suicide crisis helpline, such as the [place] at (505)831-7817. This is open 24 hours a day. Summary  If you have fatigue, you feel tired all the time and have a lack of energy or a lack of motivation.  Fatigue may make it difficult to start or complete tasks because of exhaustion.  Long-lasting (chronic) or extreme fatigue may be a symptom of a medical condition.  Exercise regularly, as told by your health care provider.  Change situations that cause you stress. Try to keep your  work and personal schedule in balance. This information is not intended to replace advice given to you by your health care provider. Make sure you discuss any questions you have with your health care provider. Document Revised: 08/21/2018 Document Reviewed: 10/25/2016 Elsevier Patient Education  [place] of Breath, Adult Shortness of breath means you have trouble breathing. Shortness of breath could be a sign of a medical problem. Follow these instructions  at home:   Watch for any changes in your symptoms.  Do not use any products that contain nicotine or tobacco, such as cigarettes, e-cigarettes, and chewing tobacco.  Do not smoke. Smoking can cause shortness of breath. If you need help to quit smoking, ask your doctor.  Avoid things that can make it harder to breathe, such as: ? Mold. ? Dust. ? Air pollution. ? Chemical smells. ? Things that can cause allergy symptoms (allergens), if you have allergies.  Keep your living space clean. Use products that help remove mold and dust.  Rest as needed. Slowly return to your normal activities.  Take over-the-counter and prescription medicines only as told by your doctor. This includes oxygen therapy and inhaled medicines.  Keep all follow-up visits as told by your doctor. This is important. Contact a doctor if:  Your condition does not get better as soon as expected.  You have a hard time doing your normal activities, even after you rest.  You have new symptoms. Get help right away if:  Your shortness of breath gets worse.  You have trouble breathing when you are resting.  You feel light-headed or you pass out (faint).  You have a cough that is not helped by medicines.  You cough up blood.  You have pain with breathing.  You have pain in your chest, arms, shoulders, or belly (abdomen).  You have a fever.  You cannot walk up stairs.  You cannot exercise the way you normally do. These symptoms may represent a serious problem that is an emergency. Do not wait to see if the symptoms will go away. Get medical help right away. Call your local emergency services (911 in the [place] Do not drive yourself to the hospital. Summary  Shortness of breath is when you have trouble breathing enough air. It can be a sign of a medical problem.  Avoid things that make it hard for you to breathe, such as smoking, pollution, mold, and dust.  Watch for any changes in your symptoms. Contact  your doctor if you do not get better or you get worse. This information is not intended to replace advice given to you by your health care provider. Make sure you discuss any questions you have with your health care provider. Document Revised: 07/02/2017 Document Reviewed: 07/02/2017 Elsevier Patient Education  [employer].  Vaginitis  Vaginitis is irritation and swelling (inflammation) of the vagina. It happens when normal bacteria and yeast in the vagina grow too much. There are many types of this condition. Treatment will depend on the type you have. Follow these instructions at home: Lifestyle  Keep your vagina area clean and dry. ? Avoid using soap. ? Rinse the area with water.  Do not do the following until your doctor says it is okay: ? Wash and clean out the vagina (douche). ? Use tampons. ? Have sex.  Wipe from front to back after going to the bathroom.  Let air reach your vagina. ? Wear cotton underwear. ? Do not wear:  Underwear while you  sleep.  Tight pants.  Thong underwear.  Underwear or nylons without a cotton panel. ? Take off any wet clothing, such as bathing suits, as soon as possible.  Use gentle, non-scented products. Do not use things that can irritate the vagina, such as fabric softeners. Avoid the following products if they are scented: ? Feminine sprays. ? Detergents. ? Tampons. ? Feminine hygiene products. ? Soaps or bubble baths.  Practice safe sex and use condoms. General instructions  Take over-the-counter and prescription medicines only as told by your doctor.  If you were prescribed an antibiotic medicine, take or use it as told by your doctor. Do not stop taking or using the antibiotic even if you start to feel better.  Keep all follow-up visits as told by your doctor. This is important. Contact a doctor if:  You have pain in your belly.  You have a fever.  Your symptoms last for more than 2-3 days. Get help right away if:   You have a fever and your symptoms get worse all of a sudden. Summary  Vaginitis is irritation and swelling of the vagina. It can happen when the normal bacteria and yeast in the vagina grow too much. There are many types.  Treatment will depend on the type you have.  Do not douche, use tampons , or have sex until your health care provider approves. When you can return to sex, practice safe sex and use condoms. This information is not intended to replace advice given to you by your health care provider. Make sure you discuss any questions you have with your health care provider. Document Revised: 01/12/2017 Document Reviewed: 02/22/2016 Elsevier Patient Education  2020 [employer].

## 2019-09-05 NOTE — Telephone Encounter (Signed)
Spoke with [facility] pharmacist advised that Dr Volanda Napoleon is aware of possible interactions and pt has taken medication in the past

## 2019-09-05 NOTE — Telephone Encounter (Signed)
Please advise 

## 2019-09-05 NOTE — Telephone Encounter (Signed)
Aware of possible interaction.  Patient has been on digoxin for several years and tolerated azithromycin in the past without issue.  Patient has allergy to amoxicillin.

## 2019-09-06 LAB — CBC WITH DIFFERENTIAL/PLATELET
Absolute Monocytes: 557 cells/uL (ref 200–950)
Basophils Absolute: 52 cells/uL (ref 0–200)
Basophils Relative: 0.6 %
Eosinophils Absolute: 139 cells/uL (ref 15–500)
Eosinophils Relative: 1.6 %
HCT: 40.4 % (ref 35.0–45.0)
Hemoglobin: 13.8 g/dL (ref 11.7–15.5)
Lymphs Abs: 3036 cells/uL (ref 850–3900)
MCH: 30.4 pg (ref 27.0–33.0)
MCHC: 34.2 g/dL (ref 32.0–36.0)
MCV: 89 fL (ref 80.0–100.0)
MPV: 10.7 fL (ref 7.5–12.5)
Monocytes Relative: 6.4 %
Neutro Abs: 4916 cells/uL (ref 1500–7800)
Neutrophils Relative %: 56.5 %
Platelets: 198 10*3/uL (ref 140–400)
RBC: 4.54 10*6/uL (ref 3.80–5.10)
RDW: 13.2 % (ref 11.0–15.0)
Total Lymphocyte: 34.9 %
WBC: 8.7 10*3/uL (ref 3.8–10.8)

## 2019-09-06 LAB — BASIC METABOLIC PANEL
BUN/Creatinine Ratio: 24 (calc) — ABNORMAL HIGH (ref 6–22)
BUN: 30 mg/dL — ABNORMAL HIGH (ref 7–25)
CO2: 23 mmol/L (ref 20–32)
Calcium: 10 mg/dL (ref 8.6–10.4)
Chloride: 101 mmol/L (ref 98–110)
Creat: 1.24 mg/dL — ABNORMAL HIGH (ref 0.60–0.93)
Glucose, Bld: 202 mg/dL — ABNORMAL HIGH (ref 65–99)
Potassium: 4.4 mmol/L (ref 3.5–5.3)
Sodium: 133 mmol/L — ABNORMAL LOW (ref 135–146)

## 2019-09-06 LAB — D-DIMER, QUANTITATIVE: D-Dimer, Quant: 0.58 mcg/mL FEU — ABNORMAL HIGH (ref <0.50)

## 2019-09-08 DIAGNOSIS — E1149 Type 2 diabetes mellitus with other diabetic neurological complication: Secondary | ICD-10-CM | POA: Diagnosis not present

## 2019-09-10 ENCOUNTER — Encounter: Payer: PPO | Attending: Physical Medicine and Rehabilitation | Admitting: Physical Medicine and Rehabilitation

## 2019-09-10 ENCOUNTER — Other Ambulatory Visit: Payer: Self-pay

## 2019-09-10 ENCOUNTER — Telehealth: Payer: Self-pay | Admitting: Family Medicine

## 2019-09-10 VITALS — BP 121/77 | HR 75 | Temp 98.3°F | Ht 61.0 in | Wt 203.0 lb

## 2019-09-10 DIAGNOSIS — M069 Rheumatoid arthritis, unspecified: Secondary | ICD-10-CM | POA: Diagnosis not present

## 2019-09-10 DIAGNOSIS — M797 Fibromyalgia: Secondary | ICD-10-CM | POA: Insufficient documentation

## 2019-09-10 DIAGNOSIS — R351 Nocturia: Secondary | ICD-10-CM | POA: Diagnosis not present

## 2019-09-10 DIAGNOSIS — M4712 Other spondylosis with myelopathy, cervical region: Secondary | ICD-10-CM | POA: Diagnosis not present

## 2019-09-10 DIAGNOSIS — R252 Cramp and spasm: Secondary | ICD-10-CM | POA: Insufficient documentation

## 2019-09-10 DIAGNOSIS — K5909 Other constipation: Secondary | ICD-10-CM

## 2019-09-10 MED ORDER — PREGABALIN 25 MG PO CAPS: 50.0000 mg | ORAL_CAPSULE | Freq: Every day | ORAL | 3 refills | Status: DC

## 2019-09-10 MED ORDER — MELOXICAM 15 MG PO TABS: 15.0000 mg | ORAL_TABLET | Freq: Every day | ORAL | 1 refills | Status: DC

## 2019-09-10 NOTE — Telephone Encounter (Signed)
Dr. Volanda Napoleon - please advise. I don't see where an order was placed?

## 2019-09-10 NOTE — Progress Notes (Signed)
Subjective:    Patient ID: [name], female    DOB: [birth year], 79 y.o.   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 Her daughter accompanies her today.   She has a history of severe cervical, thoracic, and lumbar spinal stenosis s/p two ACDFs and thoracic decompression, fusion, and posterior lateral arthrodesis with pedicle screws.   She has been having severe right sided flexor spasticity since then causing her fingers to curve inward to her palms. She has been using hand splints and exercising her hands to keep them mobile. This is very disruptive to her as she would love to be able to return to cooking and knitting, activities that she loves. She does not have other areas of spasticity. I have administered Botox to her right hand twice- the first time there was minimal benefit and the second time it made her arm weaker which lessened her ability to hold her walker. She prefers not to try the Botox again.  She ambulates using a walker and has had no recent falls. She is currently receiving PT for her right handed tone.   Her pain is neuropathic in nature and present in both hands and feet. She has uncontrolled diabetes and her insulin dose was recently increased.   She also has fibromyalgia and takes Cymbalta 30mg  which helps. I had started her on Lyrica as well and this did provide relief but she did not like that it made her sleepy during the day.   Since last visit she had some left foot pain and her daughter called me and sent me photo of her foot. The location of her pain is in the ATFL and there is associated swelling. She has had 3 XRs done which here negative for fracture. I told her daughter this was likely an ankle sprain and she was been icing and restarted Meloxicam which has helped. She also purchased an ankle brace for her but this made her trip more  easily when ambulating given her foot drop.  She has been urinating very frequently at night and this is very bothersome to her. She is on Myrbetriq. She has not seen a urologist recently. In the past she saw a urologist at [facility] and had a good experience there. Would like to return there. Has never had urodynamic studies in the past.   Pain Inventory Average Pain 5 Pain Right Now 5 My pain is intermittent, sharp, burning, dull, stabbing, tingling and aching  In the last 24 hours, has pain interfered with the following? General activity 8 Relation with others 2 Enjoyment of life 0 What TIME of day is your pain at its worst? morning Sleep (in general) Good  Pain is worse with: bending, inactivity and standing Pain improves with: rest and medication Relief from Meds: 6  Mobility walk with assistance use a walker how many minutes can you walk? 15 ability to climb steps?  no do you drive?  no  Function I need assistance with the following:  meal prep, household duties and shopping  Neuro/Psych trouble walking  Prior Studies nerve study  Physicians involved in your care Primary care . Neurologist . Psychiatrist . Rheumatologist . Neurosurgeon . Orthopedist . Psychologist . Endocrinologist   Family History  Problem Relation Age of Onset  . Heart failure Mother   . Hyperlipidemia Mother   . Lung cancer  Father        Lymphoma  . Hyperlipidemia Maternal Grandmother   . Peripheral vascular disease Maternal Grandmother    Social History   Socioeconomic History  . Marital status: Widowed    Spouse name: [name]  . Number of children: 2  . Years of education: Not on file  . Highest education level: Not on file  Occupational History  . Not on file  Tobacco Use  . Smoking status: Former Research scientist (life sciences)  . Smokeless tobacco: Never Used  . Tobacco comment: smoke 1 cigarette every few weeks when the [place] lady came by.  Vaping Use  . Vaping Use: Never used    Substance and Sexual Activity  . Alcohol use: No  . Drug use: No  . Sexual activity: Not on file  Other Topics Concern  . Not on file  Social History Narrative   Lives home alone.  Daughter [name] with her today.   Independent of ADLs.  Education HS.  Widowed.     Social Determinants of Health   Financial Resource Strain: Low Risk   . Difficulty of Paying Living Expenses: Not hard at all  Food Insecurity:   . Worried About Charity fundraiser in the Last Year:   . Arboriculturist in the Last Year:   Transportation Needs: No Transportation Needs  . Lack of Transportation (Medical): No  . Lack of Transportation (Non-Medical): No  Physical Activity:   . Days of Exercise per Week:   . Minutes of Exercise per Session:   Stress:   . Feeling of Stress :   Social Connections:   . Frequency of Communication with Friends and Family:   . Frequency of Social Gatherings with Friends and Family:   . Attends Religious Services:   . Active Member of Clubs or Organizations:   . Attends Archivist Meetings:   Marland Kitchen Marital Status:    Past Surgical History:  Procedure Laterality Date  . ABDOMINAL HYSTERECTOMY    . ANKLE SURGERY Left    x 2  . ANTERIOR CERVICAL DECOMP/DISCECTOMY FUSION N/A 11/12/2017   Procedure: Cervical seven to Thoracic one  Anterior cervical decompression/discectomy/fusion with removal of old plate;  Surgeon: [name], MD;  Location: [place];  Service: Neurosurgery;  Laterality: N/A;  . BACK SURGERY     T12-L1 fusion, Anterior Cervical fusion  . CARDIAC CATHETERIZATION N/A 02/16/2015   Procedure: Left Heart Cath and Coronary Angiography;  Surgeon: Sherren Mocha, MD;  Location: [facility] [facility];  Service: Cardiovascular;  Laterality: N/A;  . CHOLECYSTECTOMY    . COLONOSCOPY    . ESOPHAGOGASTRODUODENOSCOPY  12/21/2010   Procedure: ESOPHAGOGASTRODUODENOSCOPY (EGD);  Surgeon: Landry Dyke, MD;  Location: [facility];  Service: Endoscopy;  Laterality: N/A;   . EXCISION MORTON'S NEUROMA Right   . EYE SURGERY Bilateral    Cataract  . FRACTURE SURGERY     ankle x 2   . KNEE SURGERY Right    x 3- arthroscopy  . NECK SURGERY    . POSTERIOR CERVICAL FUSION/FORAMINOTOMY N/A 01/22/2018   Procedure: Cervical six-seven Posterior Decompression with Posterior Fixation Cervical Five to Thoracic Two;  Surgeon: [name], MD;  Location: [place];  Service: Neurosurgery;  Laterality: N/A;  Cervical 6-7 Posteror decompression with posterior fixation Cervical 5 to Thoracic 2  . ROTATOR CUFF REPAIR Right   . TONSILLECTOMY    . TOTAL KNEE ARTHROPLASTY Left 04/10/2017   Procedure: LEFT TOTAL KNEE ARTHROPLASTY;  Surgeon: Paralee Cancel,  MD;  Location: [facility];  Service: Orthopedics;  Laterality: Left;  70 mins  . UPPER GASTROINTESTINAL ENDOSCOPY    . WRIST SURGERY Left    fracture   Past Medical History:  Diagnosis Date  . Anemia    during 1st pregnancy  . Anxiety   . Arthritis   . Cancer ([place])    Basal - face  . Complication of anesthesia   . Depression   . Diarrhea, functional   . Dysrhythmia    Paroysmal Atrial Tachycardia  . Essential hypertension   . Fibromyalgia   . GERD (gastroesophageal reflux disease)   . H/O acute pancreatitis   . Headache   . History of hiatal hernia   . Hyperlipemia   . Neuropathy   . Osteoporosis   . Panic attacks   . Paralysis ([place]) 01/06/2019   hands  . PAT (paroxysmal atrial tachycardia) ([place])   . Pneumonia   . PONV (postoperative nausea and vomiting)   . PSVT (paroxysmal supraventricular tachycardia) ([place])   . Reflux   . Restless leg syndrome   . Spinal cord compression ([place])   . Type 2 diabetes mellitus ([facility])    Type II  . Vertigo    not current   BP 121/77   Pulse 75   Temp 98.3 F (36.8 C)   Ht 5\' 1"  (1.549 m)   Wt (!) 203 lb (92.1 kg)   SpO2 97%   BMI 38.36 kg/m   Opioid Risk Score:   Fall Risk Score:  `1  Depression screen PHQ 2/9  Depression screen [facility] 2/9 04/08/2019 09/28/2018  08/23/2018 10/19/2017 09/05/2017 03/27/2016  Decreased Interest 3 0 0 1 0 0  Down, Depressed, Hopeless 3 1 0 2 0 0  PHQ - 2 Score 6 1 0 3 0 0  Altered sleeping 3 - - 2 - -  Tired, decreased energy 3 - - 3 - -  Change in appetite 0 - - 3 - -  Feeling bad or failure about yourself  3 - - 3 - -  Trouble concentrating 3 - - 2 - -  Moving slowly or fidgety/restless 2 - - 1 - -  Suicidal thoughts 3 - - 3 - -  PHQ-9 Score 23 - - 20 - -  Difficult doing work/chores - - - Not difficult at all - -  Some recent data might be hidden     Review of Systems     Objective:   Physical Exam Gen: no distress, normal appearing HEENT: oral mucosa pink and moist, NCAT Cardio: Reg rate Chest: normal effort, normal rate of breathing Abd: soft, non-distended Ext: no edema Skin: intact Neuro/MSK: Pt is cognitively appropriate with normal insight, memory, and awareness. Cranial nerves 2-12 are intact. Sensory exam is normal. MAS 2 in right hand finger flexors. Rheumatic changes along right hand MCPs.  Tenderness to palpation over left foot ATFL. No TTO over navicular bone or medial malleolus.     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    Last visit I administered the following: 200U of Botox to be administered as follows to the right arm: 79T to flexor pollicis longus 90Z to adductor pollicis longus 00P to flexor carpi radialis 30U to flexor carpi ulnaris 50U to flexor digitorum profundus 50U to flexor digitorum superficialis  Lower dose of  Botox (100U was not helpful) and increased dose (200U) made her too weak to grasp her walker well. Will not attempt again at this time.   -Continue use of splint at night and exercises to maintain range of motion in right hand.   --Increased Cymbalta to 30mg  for better control of  fibromyalgia.  -Prescribed Lyrica 25mg  to use a night for fibromyalgia/insomnia.  -Provided referral to urology for urodynamic studies to determine etiology of nocturia.   -Constipation symptoms have improved with regimen of 5 prunes daily, senna HS, and colace during the day. Denies any more instances of bowel incontinence and she evacuates completely with a large BM every morning.  -Left ankle examined and appears to be ATFL sprain. Continue ice, rest, and Meloxicam. Brace caused her to trip more easily given her foot drop so will not reattempt.   All questions answered. RTC in 2 months. 40 minutes were spent in direct patient care including discussion and management of constipation, neuropathic pain, left ATFL sprain, RUE spasticity, nocturia.

## 2019-09-10 NOTE — Telephone Encounter (Signed)
Pt daughter [name]) called and said Dr. Volanda Napoleon was to send a referral for mom [name] to have an ultrasound of her legs.  Call daughter at  [phone]  Please advise

## 2019-09-11 ENCOUNTER — Encounter: Payer: Self-pay | Admitting: Physical Medicine and Rehabilitation

## 2019-09-11 ENCOUNTER — Other Ambulatory Visit: Payer: Self-pay | Admitting: Family Medicine

## 2019-09-11 DIAGNOSIS — R7989 Other specified abnormal findings of blood chemistry: Secondary | ICD-10-CM

## 2019-09-11 NOTE — Telephone Encounter (Signed)
Order placed

## 2019-09-12 NOTE — Telephone Encounter (Signed)
I called and spoke with pt's daughter. She is aware that order has been placed & they should receive a call to schedule.

## 2019-09-14 ENCOUNTER — Other Ambulatory Visit: Payer: Self-pay | Admitting: Family Medicine

## 2019-09-14 DIAGNOSIS — G2581 Restless legs syndrome: Secondary | ICD-10-CM

## 2019-09-17 ENCOUNTER — Ambulatory Visit (INDEPENDENT_AMBULATORY_CARE_PROVIDER_SITE_OTHER)
Admission: RE | Admit: 2019-09-17 | Discharge: 2019-09-17 | Disposition: A | Discharge status: Home / Self Care | Payer: PPO | Source: Ambulatory Visit | Attending: Family Medicine | Admitting: Family Medicine

## 2019-09-17 ENCOUNTER — Encounter: Payer: Self-pay | Admitting: Family Medicine

## 2019-09-17 ENCOUNTER — Other Ambulatory Visit: Payer: Self-pay

## 2019-09-17 ENCOUNTER — Ambulatory Visit (INDEPENDENT_AMBULATORY_CARE_PROVIDER_SITE_OTHER): Payer: PPO | Admitting: Family Medicine

## 2019-09-17 VITALS — BP 126/80 | HR 80 | Temp 98.1°F | Resp 16 | Ht 61.0 in | Wt 203.2 lb

## 2019-09-17 DIAGNOSIS — I471 Supraventricular tachycardia, unspecified: Secondary | ICD-10-CM

## 2019-09-17 DIAGNOSIS — Z0000 Encounter for general adult medical examination without abnormal findings: Secondary | ICD-10-CM

## 2019-09-17 DIAGNOSIS — I700 Atherosclerosis of aorta: Secondary | ICD-10-CM | POA: Diagnosis not present

## 2019-09-17 DIAGNOSIS — R0782 Intercostal pain: Secondary | ICD-10-CM | POA: Diagnosis not present

## 2019-09-17 DIAGNOSIS — E1169 Type 2 diabetes mellitus with other specified complication: Secondary | ICD-10-CM

## 2019-09-17 DIAGNOSIS — Z1159 Encounter for screening for other viral diseases: Secondary | ICD-10-CM

## 2019-09-17 DIAGNOSIS — F330 Major depressive disorder, recurrent, mild: Secondary | ICD-10-CM | POA: Diagnosis not present

## 2019-09-17 DIAGNOSIS — E785 Hyperlipidemia, unspecified: Secondary | ICD-10-CM | POA: Diagnosis not present

## 2019-09-17 DIAGNOSIS — R0602 Shortness of breath: Secondary | ICD-10-CM | POA: Diagnosis not present

## 2019-09-17 MED ORDER — IOHEXOL 350 MG/ML SOLN
65.0000 mL | Freq: Once | INTRAVENOUS | Status: AC | PRN
  Administered 2019-09-17: 65 mL via INTRAVENOUS

## 2019-09-17 NOTE — Assessment & Plan Note (Signed)
She is not on pharmacologic treatment. Recommendations in regard to statin dose will be given according to FLP results.

## 2019-09-17 NOTE — Assessment & Plan Note (Signed)
Stable Continue Cymbalta 60mg daily

## 2019-09-17 NOTE — Patient Instructions (Signed)
A few things to remember from today's visit:   Routine general medical examination at a health care facility  Encounter for HCV screening test for low risk patient - Plan: Hepatitis C antibody  Intercostal pain - Plan: CT Angio Chest W/Cm &/Or Wo Cm  SOB (shortness of breath)  Positive D dimer  Hyperlipidemia associated with type 2 diabetes mellitus ([place]) - Plan: Lipid panel  If you need refills please call your pharmacy. Do not use My Chart to request refills or for acute issues that need immediate attention.   A few tips:  -As we age balance is not as good as it was, so there is a higher risks for falls. Please remove small rugs and furniture that is "in your way" and could increase the risk of falls. Stretching exercises may help with fall prevention: Yoga and Tai Chi are some examples. Low impact exercise is better, so you are not very achy the next day.  -Sun screen and avoidance of direct sun light recommended. Caution with dehydration, if working outdoors be sure to drink enough fluids.  - Some medications are not safe as we age, increases the risk of side effects and can potentially interact with other medication you are also taken;  including some of over the counter medications. Be sure to let me know when you start a new medication even if it is a dietary/vitamin supplement.   -Healthy diet low in red meet/animal fat and sugar + regular physical activity is recommended.      Please be sure medication list is accurate. If a new problem present, please set up appointment sooner than planned today.

## 2019-09-17 NOTE — Assessment & Plan Note (Signed)
Wt has been stable. Consistency with following a healthful diet. PT to be resumed when PE and/DVT are ruled out.

## 2019-09-17 NOTE — Assessment & Plan Note (Signed)
Problem is well controlled with Verapamil 120 mg daily. Continue monitoring HR.

## 2019-09-17 NOTE — Progress Notes (Addendum)
HPI: Leah.[name] is a 79 y.o. female, who is here today with her daughter for her routine physical.  Last CPE: 08/23/18.  Regular exercise 3 or more time per week: Not consistently but on PT for the past few months. Following a healthy diet: She is doing better. She lives with her daughter now. She moved in with her because it is easier to help with care, she is [name]' caregiver.  Her daughter works, so she has 2 aids 8 am-2 pm and 4 pm to 7:30 pm) to help with ADL's and IADL's. She uses a walker to help with transfer. Needs assistance with toilet,dressing,grooming,shawering. She does not drive.  Chronic medical problems: DM II,fibromyalgia,HTN,depression,anxiety,SVT,peripheral neuropathy,cervical spondylosis with myelopathy,post traumatic spasticity,and constipation among some.  Immunization History  Administered Date(s) Administered  . Fluad Quad(high Dose 65+) 11/08/2018  . Influenza, High Dose Seasonal PF 11/10/2016, 12/27/2017  . PFIZER SARS-COV-2 Vaccination 03/28/2019, 04/22/2019  . Pneumococcal Conjugate-13 08/13/2012  . Pneumococcal Polysaccharide-23 08/23/2007   Mammogram: 02/15/16 Colonoscopy: 12/21/10. DEXA: 06/14/15. HCV screening: Never.  She has some concerns today. She has had a few health issues since her last visit. 2 falls in the past 2 months. 07/2019 and 09/13/19. Last fall happened in the bathroom while she was brushing her teeth, apparently lost balance, ? Dizzy spell and fell. EMS evaluated pt at home, did not feel like needed ER visit.  Having left ankle pain, sprain , improving. Left-sided sharp chest pain,points to lateral aspect,"nerve pain."Intermitent, exacerbated by certain movements and palpating area. Worsening SOB, which is gradually getting better but not back to her baseline. Negative for CP,cough,or wheezing. CXR on 09/07/19 negative for acute cardiopulmonary disease.  There was a concerned about possible DVT, so D dimer  ordered. Pending LE [place] to evaluate for DVT because elevated D-Dimer.  Fibromyalgia: Since her last visit Cymb alata was increased to 60 mg and Lyrica was added at night. Constipation: She is on Senakot daily at night and having bowel movements at least 6 times per week. She was evaluated for abdominal pain in 07/2019, abdominal X ray reported as negative. Improved with having more regular bowel movements.  Daughter is reporting foot drop, noted 2 months ago and stable.She can barely lift foot when walking. She has not seen neurosurgeon, follow with Dr Ranell Patrick (phys med). Dx'ed with B12 deficiency and on oral B12 supplementation.  HLD: She is on non pharmacologic treatment.  Lab Results  Component Value Date   CHOL 151 01/20/2016   HDL 27 (A) 01/20/2016   LDLCALC 82 01/20/2016   TRIG 438 (A) 01/20/2016   CHOLHDL 5.9 02/15/2015   Urinary frequency and dysuria, evaluated on 09/05/19. UA not suggestive of UTI. Myrbetriq and decreasing sodas intake have helped. Nocturia x 5. Pending appt with urologist.  Review of Systems  Constitutional: Positive for fatigue. Negative for appetite change and fever.  HENT: Negative for ear pain, hearing loss, mouth sores and sore throat.   Eyes: Negative for photophobia and visual disturbance.  Cardiovascular: Negative for leg swelling.       Negative for orthopnea and PND.  Gastrointestinal: Negative for abdominal pain, nausea and vomiting.  Endocrine: Negative for cold intolerance and heat intolerance.  Genitourinary: Negative for decreased urine volume, hematuria, vaginal bleeding and vaginal discharge.  Musculoskeletal: Positive for arthralgias, back pain and gait problem.  Skin: Negative for color change and rash.  Neurological: Negative for syncope and headaches.  Psychiatric/Behavioral: Negative for confusion. The patient is nervous/anxious.  All other systems reviewed and are negative.  Current Outpatient Medications on File Prior to Visit   Medication Sig Dispense Refill  . acetaminophen (TYLENOL) 325 MG tablet Take 325 mg by mouth every 6 (six) hours as needed.    Marland Kitchen aspirin EC 81 MG tablet Take 81 mg by mouth at bedtime.     . Calcium Carb-Cholecalciferol (CALCIUM 600+D3) 600-200 MG-UNIT TABS Take 1 tablet by mouth in the morning and at bedtime.     . carbamide peroxide (DEBROX) 6.5 % OTIC solution Place 5 drops into the left ear 2 (two) times daily. (Patient taking differently: Place 5 drops into the left ear 2 (two) times daily. prn) 15 mL 0  . Continuous Blood Gluc Receiver (DEXCOM G6 RECEIVER) DEVI 1 Device by Does not apply route daily. 1 Device 0  . Continuous Blood Gluc Sensor (DEXCOM G6 SENSOR) MISC 1 kit by Does not apply route as directed. 3 each 3  . Continuous Blood Gluc Transmit (DEXCOM G6 TRANSMITTER) MISC 1 kit by Does not apply route as directed. 1 each 3  . Cranberry 450 MG CAPS Take 900 mg by mouth daily.     . digoxin (LANOXIN) 0.125 MG tablet Take 1 tablet (0.125 mg total) by mouth daily. Please make yearly appt for September before anymore refills. 1st attempt 90 tablet 0  . docusate sodium (COLACE) 100 MG capsule Take 100 mg by mouth daily. twice a day    . DULoxetine (CYMBALTA) 30 MG capsule Take 2 capsules (60 mg total) by mouth daily. 60 capsule 3  . ezetimibe (ZETIA) 10 MG tablet TAKE 1 TABLET BY MOUTH EVERY DAY 90 tablet 2  . glucose blood test strip Use as instructed to test blood sugars 3 times daily DX.E11.42 100 each 12  . insulin aspart (NOVOLOG FLEXPEN) 100 UNIT/ML FlexPen Inject 10 Units into the skin daily with breakfast AND 10 Units daily with lunch AND 12 Units daily with supper. Max daily 60 units per correction scale. 45 mL 6  . insulin glargine (LANTUS SOLOSTAR) 100 UNIT/ML Solostar Pen Inject 20 Units into the skin daily. 15 mL 11  . Insulin Pen Needle (BD PEN NEEDLE MICRO U/F) 32G X 6 MM MISC 4 TIMES A DAY NOVOLOG(3) AND LANTUS9(1) 150 each 11  . meloxicam (MOBIC) 15 MG tablet Take 1 tablet  (15 mg total) by mouth daily. 30 tablet 1  . metoprolol succinate (TOPROL-XL) 25 MG 24 hr tablet Take 1 tablet (25 mg total) by mouth daily. Please make yearly appt with Dr. Marlou Porch for September for future refills. 1st attempt 90 tablet 0  . Multiple Vitamins-Minerals (PRESERVISION AREDS 2 PO) Take 1 tablet by mouth 2 (two) times daily.     Marland Kitchen MYRBETRIQ 25 MG TB24 tablet TAKE 1 TABLET BY MOUTH EVERY DAY 30 tablet 2  . omeprazole (PRILOSEC) 20 MG capsule Take 1 capsule (20 mg total) by mouth daily. 90 capsule 2  . oxyCODONE-acetaminophen (PERCOCET/ROXICET) 5-325 MG tablet Take 0.5 tablets by mouth as needed for severe pain.    . pregabalin (LYRICA) 25 MG capsule Take 2 capsules (50 mg total) by mouth at bedtime. 60 capsule 3  . ramipril (ALTACE) 10 MG capsule TAKE 1 CAPSULE (10 MG TOTAL) BY MOUTH EVERY EVENING. 90 capsule 2  . RELION INSULIN SYRINGE 1ML/31G 31G X 5/16" 1 ML MISC Use as directed 100 each 1  . rOPINIRole (REQUIP) 1 MG tablet TAKE ONE TABLET BY MOUTH AT BEDTIME. 90 tablet 1  . senna (SENOKOT)  8.6 MG tablet Take 1 tablet by mouth daily.    . verapamil (CALAN) 120 MG tablet Take 120 mg by mouth daily as needed (afib).     . verapamil (VERELAN PM) 240 MG 24 hr capsule TAKE 1 CAPSULE BY MOUTH EVERY DAY 90 capsule 3  . VITAMIN D PO Take 10,000 Units by mouth daily. 10,000 in the am and 5,000 in the pm     No current facility-administered medications on file prior to visit.    Past Medical History:  Diagnosis Date  . Anemia    during 1st pregnancy  . Anxiety   . Arthritis   . Cancer ([place])    Basal - face  . Complication of anesthesia   . Depression   . Diarrhea, functional   . Dysrhythmia    Paroysmal Atrial Tachycardia  . Essential hypertension   . Fibromyalgia   . GERD (gastroesophageal reflux disease)   . H/O acute pancreatitis   . Headache   . History of hiatal hernia   . Hyperlipemia   . Neuropathy   . Osteoporosis   . Panic attacks   . Paralysis ([place]) 01/06/2019     hands  . PAT (paroxysmal atrial tachycardia) ([place])   . Pneumonia   . PONV (postoperative nausea and vomiting)   . PSVT (paroxysmal supraventricular tachycardia) ([place])   . Reflux   . Restless leg syndrome   . Spinal cord compression ([place])   . Type 2 diabetes mellitus (HCC)    Type II  . Vertigo    not current    Past Surgical History:  Procedure Laterality Date  . ABDOMINAL HYSTERECTOMY    . ANKLE SURGERY Left    x 2  . ANTERIOR CERVICAL DECOMP/DISCECTOMY FUSION N/A 11/12/2017   Procedure: Cervical seven to Thoracic one  Anterior cervical decompression/discectomy/fusion with removal of old plate;  Surgeon: [name], MD;  Location: [facility];  Service: Neurosurgery;  Laterality: N/A;  . BACK SURGERY     T12-L1 fusion, Anterior Cervical fusion  . CARDIAC CATHETERIZATION N/A 02/16/2015   Procedure: Left Heart Cath and Coronary Angiography;  Surgeon: Sherren Mocha, MD;  Location: [facility];  Service: Cardiovascular;  Laterality: N/A;  . CHOLECYSTECTOMY    . COLONOSCOPY    . ESOPHAGOGASTRODUODENOSCOPY  12/21/2010   Procedure: ESOPHAGOGASTRODUODENOSCOPY (EGD);  Surgeon: Landry Dyke, MD;  Location: [facility];  Service: Endoscopy;  Laterality: N/A;  . EXCISION MORTON'S NEUROMA Right   . EYE SURGERY Bilateral    Cataract  . FRACTURE SURGERY     ankle x 2   . KNEE SURGERY Right    x 3- arthroscopy  . NECK SURGERY    . POSTERIOR CERVICAL FUSION/FORAMINOTOMY N/A 01/22/2018   Procedure: Cervical six-seven Posterior Decompression with Posterior Fixation Cervical Five to Thoracic Two;  Surgeon: [name], MD;  Location: [facility];  Service: Neurosurgery;  Laterality: N/A;  Cervical 6-7 Posteror decompression with posterior fixation Cervical 5 to Thoracic 2  . ROTATOR CUFF REPAIR Right   . TONSILLECTOMY    . TOTAL KNEE ARTHROPLASTY Left 04/10/2017   Procedure: LEFT TOTAL KNEE ARTHROPLASTY;  Surgeon: Paralee Cancel, MD;  Location: [facility];  Service: Orthopedics;  Laterality:  Left;  70 mins  . UPPER GASTROINTESTINAL ENDOSCOPY    . WRIST SURGERY Left    fracture    Allergies  Allergen Reactions  . Amoxil [Amoxicillin] Swelling and Other (See Comments)    Lips Has patient had a PCN reaction causing immediate rash,  facial/tongue/throat swelling, SOB or lightheadedness with hypotension:YES Has patient had a PCN reaction causing severe rash involving mucus membranes or skin necrosis:No Has patient had a PCN reaction that required hospitalization:No Has patient had a PCN reaction occurring within the last 10 years:Yes. If all of the above answers are "NO", then may proceed with Cephalosporin use.   Marland Kitchen Lisinopril Itching and Swelling    SWELLING REACTION UNSPECIFIED   . Chocolate Hives  . Demerol Nausea And Vomiting  . Other Other (See Comments)    Raw food or nuts - GI pain    Family History  Problem Relation Age of Onset  . Heart failure Mother   . Hyperlipidemia Mother   . Lung cancer Father        Lymphoma  . Hyperlipidemia Maternal Grandmother   . Peripheral vascular disease Maternal Grandmother     Social History   Socioeconomic History  . Marital status: Widowed    Spouse name: Glendell Docker  . Number of children: 2  . Years of education: Not on file  . Highest education level: Not on file  Occupational History  . Not on file  Tobacco Use  . Smoking status: Former Research scientist (life sciences)  . Smokeless tobacco: Never Used  . Tobacco comment: smoke 1 cigarette every few weeks when the [place] lady came by.  Vaping Use  . Vaping Use: Never used  Substance and Sexual Activity  . Alcohol use: No  . Drug use: No  . Sexual activity: Not on file  Other Topics Concern  . Not on file  Social History Narrative   Lives home alone.  Daughter [name] with her today.   Independent of ADLs.  Education HS.  Widowed.     Social Determinants of Health   Financial Resource Strain: Low Risk   . Difficulty of Paying Living Expenses: Not hard at all  Food Insecurity:   .  Worried About Charity fundraiser in the Last Year:   . Arboriculturist in the Last Year:   Transportation Needs: No Transportation Needs  . Lack of Transportation (Medical): No  . Lack of Transportation (Non-Medical): No  Physical Activity:   . Days of Exercise per Week:   . Minutes of Exercise per Session:   Stress:   . Feeling of Stress :   Social Connections:   . Frequency of Communication with Friends and Family:   . Frequency of Social Gatherings with Friends and Family:   . Attends Religious Services:   . Active Member of Clubs or Organizations:   . Attends Archivist Meetings:   Marland Kitchen Marital Status:      Vitals:   09/17/19 0659  BP: 126/80  Pulse: 80  Resp: 16  Temp: 98.1 F (36.7 C)  SpO2: 97%   Body mass index is 38.4 kg/m.   Wt Readings from Last 3 Encounters:  09/17/19 203 lb 4 oz (92.2 kg)  09/10/19 (!) 203 lb (92.1 kg)  09/05/19 (!) 204 lb (92.5 kg)    Physical Exam Vitals and nursing note reviewed.  Constitutional:      General: She is not in acute distress.    Appearance: She is well-developed.  HENT:     Head: Normocephalic and atraumatic.     Right Ear: Hearing, tympanic membrane, ear canal and external ear normal.     Left Ear: Hearing, tympanic membrane, ear canal and external ear normal.     Mouth/Throat:     Mouth: Mucous membranes are  moist.     Pharynx: Oropharynx is clear. Uvula midline.  Eyes:     Conjunctiva/sclera: Conjunctivae normal.     Pupils: Pupils are equal, round, and reactive to light.  Neck:     Thyroid: No thyromegaly.     Trachea: No tracheal deviation.  Cardiovascular:     Rate and Rhythm: Normal rate and regular rhythm.     Pulses:          Dorsalis pedis pulses are 2+ on the right side and 2+ on the left side.     Heart sounds: No murmur heard.      Comments: Mild bilateral peri ankle edema, no deformity or erythema. No calves tenderness with foot dorsiflexion.  Pulmonary:     Effort: Pulmonary effort  is normal. No respiratory distress.     Breath sounds: Normal breath sounds.  Abdominal:     Palpations: Abdomen is soft.     Tenderness: There is no abdominal tenderness.  Musculoskeletal:     Comments: No signs of synovitis appreciated.  Lymphadenopathy:     Cervical: No cervical adenopathy.  Skin:    General: Skin is warm.     Findings: No erythema or rash.  Neurological:     Mental Status: She is alert and oriented to person, place, and time.     Cranial Nerves: No cranial nerve deficit.     Gait: Gait abnormal.     Comments: Unstable gait assisted with a walker. Spasticity affecting mainly right hand, thumb.  Psychiatric:        Speech: Speech normal.     Comments: Well groomed, good eye contact.    ASSESSMENT AND PLAN:  Leah. HENRETTER PIEKARSKI was here today annual physical examination.  Orders Placed This Encounter  Procedures  . CT Angio Chest W/Cm &/Or Wo Cm  . Lipid panel  . Hepatitis C antibody    Lab Results  Component Value Date   CHOL 183 09/17/2019   HDL 32 (L) 09/17/2019   [place]  09/17/2019     Comment:     . LDL cholesterol not calculated. Triglyceride levels greater than 400 mg/dL invalidate calculated LDL results. . Reference range: <100 . Desirable range <100 mg/dL for primary prevention;   <70 mg/dL for patients with CHD or diabetic patients  with > or = 2 CHD risk factors. Marland Kitchen LDL-C is now calculated using the Martin-Hopkins  calculation, which is a validated novel method providing  better accuracy than the Friedewald equation in the  estimation of LDL-C.  Cresenciano Genre et al. Annamaria Helling. 4008;676(19): 2061-2068  (http://education.[employer].com/faq/FAQ164)    TRIG 539 (H) 09/17/2019   CHOLHDL 5.7 (H) 09/17/2019    Routine general medical examination at a health care facility We discussed the importance of regular physical activity and healthy diet for prevention of complications. Preventive guidelines reviewed. Vaccination up to  date.  Ca++ and vit D supplementation to continue. Next CPE in a year.  Encounter for HCV screening test for low risk patient -     Hepatitis C antibody  Intercostal pain Seems to be musculoskeletal, most likely related to fibromyalgia or soft tissue trauma from recent fall.But given the fact she is c/o SOB and there is a minimally elevated D dimer, I think chest CT is warranted.  -     CT Angio Chest W/Cm &/Or Wo Cm; Future  SOB (shortness of breath) ? Deconditioning. Chest CT arranged to evaluate for more serious process.  Mild recurrent major depression (HCC) Stable.  Continue Cymbalta 60 mg daily.  Hyperlipidemia associated with type 2 diabetes mellitus ([place]) She is not on pharmacologic treatment. Recommendations in regard to statin dose will be given according to FLP results.   SVT (supraventricular tachycardia) (HCC) Problem is well controlled with Verapamil 120 mg daily. Continue monitoring HR.  Morbid obesity ([place]) Wt has been stable. Consistency with following a healthful diet. PT to be resumed when PE and/DVT are ruled out.   Return in about 6 months (around 03/19/2020).   Jamarrion Budai G. [name], MD  [facility]. [place] office.   A few things to remember from today's visit:   Routine general medical examination at a health care facility  Encounter for HCV screening test for low risk patient - Plan: Hepatitis C antibody  Intercostal pain - Plan: CT Angio Chest W/Cm &/Or Wo Cm  SOB (shortness of breath)  Positive D dimer  Hyperlipidemia associated with type 2 diabetes mellitus ([place]) - Plan: Lipid panel  If you need refills please call your pharmacy. Do not use My Chart to request refills or for acute issues that need immediate attention.   A few tips:  -As we age balance is not as good as it was, so there is a higher risks for falls. Please remove small rugs and furniture that is "in your way" and could increase the risk of falls. Stretching  exercises may help with fall prevention: Yoga and Tai Chi are some examples. Low impact exercise is better, so you are not very achy the next day.  -Sun screen and avoidance of direct sun light recommended. Caution with dehydration, if working outdoors be sure to drink enough fluids.  - Some medications are not safe as we age, increases the risk of side effects and can potentially interact with other medication you are also taken;  including some of over the counter medications. Be sure to let me know when you start a new medication even if it is a dietary/vitamin supplement.   -Healthy diet low in red meet/animal fat and sugar + regular physical activity is recommended.      Please be sure medication list is accurate. If a new problem present, please set up appointment sooner than planned today.

## 2019-09-18 ENCOUNTER — Ambulatory Visit (HOSPITAL_COMMUNITY)
Admission: RE | Admit: 2019-09-18 | Discharge: 2019-09-18 | Disposition: A | Discharge status: Home / Self Care | Payer: PPO | Source: Ambulatory Visit | Attending: Cardiovascular Disease | Admitting: Cardiovascular Disease

## 2019-09-18 DIAGNOSIS — R7989 Other specified abnormal findings of blood chemistry: Secondary | ICD-10-CM

## 2019-09-18 LAB — LIPID PANEL
Cholesterol: 183 mg/dL (ref <200)
HDL: 32 mg/dL — ABNORMAL LOW (ref >=50)
Non-HDL Cholesterol (Calc): 151 mg/dL (calc) — ABNORMAL HIGH (ref <130)
Total CHOL/HDL Ratio: 5.7 (calc) — ABNORMAL HIGH (ref <5.0)
Triglycerides: 539 mg/dL — ABNORMAL HIGH (ref <150)

## 2019-09-18 LAB — HEPATITIS C ANTIBODY
Hepatitis C Ab: NONREACTIVE
SIGNAL TO CUT-OFF: 0.01 (ref <1.00)

## 2019-09-20 DIAGNOSIS — I700 Atherosclerosis of aorta: Secondary | ICD-10-CM | POA: Insufficient documentation

## 2019-09-22 ENCOUNTER — Other Ambulatory Visit: Payer: Self-pay

## 2019-09-22 ENCOUNTER — Other Ambulatory Visit: Payer: Self-pay | Admitting: Cardiology

## 2019-09-22 DIAGNOSIS — I10 Essential (primary) hypertension: Secondary | ICD-10-CM

## 2019-09-22 MED ORDER — ATORVASTATIN CALCIUM 10 MG PO TABS
10.0000 mg | ORAL_TABLET | Freq: Every day | ORAL | 3 refills | Status: DC
Start: 2019-09-22 — End: 2019-10-17

## 2019-09-26 DIAGNOSIS — M4804 Spinal stenosis, thoracic region: Secondary | ICD-10-CM | POA: Diagnosis not present

## 2019-09-30 ENCOUNTER — Encounter (HOSPITAL_COMMUNITY): Payer: PPO

## 2019-10-03 DIAGNOSIS — M4804 Spinal stenosis, thoracic region: Secondary | ICD-10-CM | POA: Diagnosis not present

## 2019-10-06 ENCOUNTER — Other Ambulatory Visit: Payer: Self-pay | Admitting: Family Medicine

## 2019-10-06 DIAGNOSIS — R350 Frequency of micturition: Secondary | ICD-10-CM

## 2019-10-08 ENCOUNTER — Encounter (HOSPITAL_COMMUNITY): Payer: PPO

## 2019-10-09 DIAGNOSIS — E1149 Type 2 diabetes mellitus with other diabetic neurological complication: Secondary | ICD-10-CM | POA: Diagnosis not present

## 2019-10-10 DIAGNOSIS — M4804 Spinal stenosis, thoracic region: Secondary | ICD-10-CM | POA: Diagnosis not present

## 2019-10-14 ENCOUNTER — Encounter: Payer: Self-pay | Admitting: Family Medicine

## 2019-10-17 ENCOUNTER — Other Ambulatory Visit: Payer: Self-pay | Admitting: Family Medicine

## 2019-10-17 DIAGNOSIS — M4804 Spinal stenosis, thoracic region: Secondary | ICD-10-CM | POA: Diagnosis not present

## 2019-10-17 MED ORDER — ROSUVASTATIN CALCIUM 10 MG PO TABS: 10.0000 mg | ORAL_TABLET | Freq: Every day | ORAL | 1 refills | Status: DC

## 2019-10-24 DIAGNOSIS — M4804 Spinal stenosis, thoracic region: Secondary | ICD-10-CM | POA: Diagnosis not present

## 2019-10-30 DIAGNOSIS — M4804 Spinal stenosis, thoracic region: Secondary | ICD-10-CM | POA: Diagnosis not present

## 2019-11-02 ENCOUNTER — Other Ambulatory Visit: Payer: Self-pay | Admitting: Physical Medicine and Rehabilitation

## 2019-11-05 ENCOUNTER — Ambulatory Visit: Payer: PPO | Admitting: Cardiology

## 2019-11-05 ENCOUNTER — Other Ambulatory Visit: Payer: Self-pay

## 2019-11-05 ENCOUNTER — Encounter: Payer: Self-pay | Admitting: Cardiology

## 2019-11-05 VITALS — BP 128/70 | HR 71 | Ht 61.0 in | Wt 205.0 lb

## 2019-11-05 DIAGNOSIS — I471 Supraventricular tachycardia: Secondary | ICD-10-CM | POA: Diagnosis not present

## 2019-11-05 DIAGNOSIS — I2510 Atherosclerotic heart disease of native coronary artery without angina pectoris: Secondary | ICD-10-CM

## 2019-11-05 DIAGNOSIS — I700 Atherosclerosis of aorta: Secondary | ICD-10-CM | POA: Diagnosis not present

## 2019-11-05 DIAGNOSIS — I10 Essential (primary) hypertension: Secondary | ICD-10-CM

## 2019-11-05 DIAGNOSIS — I2584 Coronary atherosclerosis due to calcified coronary lesion: Secondary | ICD-10-CM | POA: Diagnosis not present

## 2019-11-05 NOTE — Progress Notes (Signed)
Cardiology Office Note:    Date:  11/05/2019   ID:  [name], DOB 1940-04-27, MRN [id number]  PCP:  [name], [name], MD  [facility] Cardiologist:  Candee Furbish, MD  [facility] Electrophysiologist:  None   Referring MD: [name], [name], MD     History of Present Illness:    [name] is a 79 y.o. female here for follow-up of SVT hypertension hyperlipidemia type 2 diabetes with neuropathy, fibromyalgia.  Dr. Wynonia Lawman used to see her, last in 2019.  SVT has been medically managed with verapamil.  Has had severe anxiety diabetes in the past.  Cardiac catheterization in 2017 with mildly elevated troponin was normal.  Overall has finger deformities from cervical spine.  Has "relearn how to walk "twice.  Severe back issues.  No syncope, no bleeding no chest pain.  Occasionally will feel a flutter-like sensation.  She will lay down and it goes away.    Past Medical History:  Diagnosis Date  . Anemia    during 1st pregnancy  . Anxiety   . Arthritis   . Cancer ([place])    Basal - face  . Complication of anesthesia   . Depression   . Diarrhea, functional   . Dysrhythmia    Paroysmal Atrial Tachycardia  . Essential hypertension   . Fibromyalgia   . GERD (gastroesophageal reflux disease)   . H/O acute pancreatitis   . Headache   . History of hiatal hernia   . Hyperlipemia   . Neuropathy   . Osteoporosis   . Panic attacks   . Paralysis ([place]) 01/06/2019   hands  . PAT (paroxysmal atrial tachycardia) ([place])   . Pneumonia   . PONV (postoperative nausea and vomiting)   . PSVT (paroxysmal supraventricular tachycardia) ([place])   . Reflux   . Restless leg syndrome   . Spinal cord compression ([place])   . Type 2 diabetes mellitus (HCC)    Type II  . Vertigo    not current    Past Surgical History:  Procedure Laterality Date  . ABDOMINAL HYSTERECTOMY    . ANKLE SURGERY Left    x 2  . ANTERIOR CERVICAL DECOMP/DISCECTOMY FUSION N/A 11/12/2017   Procedure: Cervical  seven to Thoracic one  Anterior cervical decompression/discectomy/fusion with removal of old plate;  Surgeon: [name], MD;  Location: [place];  Service: Neurosurgery;  Laterality: N/A;  . BACK SURGERY     T12-L1 fusion, Anterior Cervical fusion  . CARDIAC CATHETERIZATION N/A 02/16/2015   Procedure: Left Heart Cath and Coronary Angiography;  Surgeon: Sherren Mocha, MD;  Location: [place] [facility];  Service: Cardiovascular;  Laterality: N/A;  . CHOLECYSTECTOMY    . COLONOSCOPY    . ESOPHAGOGASTRODUODENOSCOPY  12/21/2010   Procedure: ESOPHAGOGASTRODUODENOSCOPY (EGD);  Surgeon: Landry Dyke, MD;  Location: [facility];  Service: Endoscopy;  Laterality: N/A;  . EXCISION MORTON'S NEUROMA Right   . EYE SURGERY Bilateral    Cataract  . FRACTURE SURGERY     ankle x 2   . KNEE SURGERY Right    x 3- arthroscopy  . NECK SURGERY    . POSTERIOR CERVICAL FUSION/FORAMINOTOMY N/A 01/22/2018   Procedure: Cervical six-seven Posterior Decompression with Posterior Fixation Cervical Five to Thoracic Two;  Surgeon: [name], MD;  Location: [place];  Service: Neurosurgery;  Laterality: N/A;  Cervical 6-7 Posteror decompression with posterior fixation Cervical 5 to Thoracic 2  . ROTATOR CUFF REPAIR Right   . TONSILLECTOMY    .  TOTAL KNEE ARTHROPLASTY Left 04/10/2017   Procedure: LEFT TOTAL KNEE ARTHROPLASTY;  Surgeon: Paralee Cancel, MD;  Location: [facility];  Service: Orthopedics;  Laterality: Left;  70 mins  . UPPER GASTROINTESTINAL ENDOSCOPY    . WRIST SURGERY Left    fracture    Current Medications: Current Meds  Medication Sig  . acetaminophen (TYLENOL) 325 MG tablet Take 325 mg by mouth every 6 (six) hours as needed.  Marland Kitchen aspirin EC 81 MG tablet Take 81 mg by mouth at bedtime.   . Calcium Carb-Cholecalciferol (CALCIUM 600+D3) 600-200 MG-UNIT TABS Take 1 tablet by mouth in the morning and at bedtime.   . carbamide peroxide (DEBROX) 6.5 % OTIC solution Place 5 drops into the left ear 2 (two) times  daily.  . Continuous Blood Gluc Receiver (DEXCOM G6 RECEIVER) DEVI 1 Device by Does not apply route daily.  . Continuous Blood Gluc Sensor (DEXCOM G6 SENSOR) MISC 1 kit by Does not apply route as directed.  . Continuous Blood Gluc Transmit (DEXCOM G6 TRANSMITTER) MISC 1 kit by Does not apply route as directed.  . Cranberry 450 MG CAPS Take 900 mg by mouth daily.   . digoxin (LANOXIN) 0.125 MG tablet Take 1 tablet (0.125 mg total) by mouth daily. Please keep upcoming appt with Dr. Marlou Porch in September before anymore refills. Thank you  . docusate sodium (COLACE) 100 MG capsule Take 100 mg by mouth daily. twice a day  . DULoxetine (CYMBALTA) 30 MG capsule Take 2 capsules (60 mg total) by mouth daily.  Marland Kitchen ezetimibe (ZETIA) 10 MG tablet TAKE 1 TABLET BY MOUTH EVERY DAY  . glucose blood test strip Use as instructed to test blood sugars 3 times daily [id number]  . insulin aspart (NOVOLOG FLEXPEN) 100 UNIT/ML FlexPen Inject 10 Units into the skin daily with breakfast AND 10 Units daily with lunch AND 12 Units daily with supper. Max daily 60 units per correction scale.  . insulin glargine (LANTUS SOLOSTAR) 100 UNIT/ML Solostar Pen Inject 20 Units into the skin daily.  . Insulin Pen Needle (BD PEN NEEDLE MICRO U/F) 32G X 6 MM MISC 4 TIMES A DAY NOVOLOG(3) AND LANTUS9(1)  . meloxicam (MOBIC) 15 MG tablet TAKE 1 TABLET BY MOUTH EVERY DAY  . metoprolol succinate (TOPROL-XL) 25 MG 24 hr tablet Take 1 tablet (25 mg total) by mouth daily. Please keep upcoming appt in September with Dr. Marlou Porch before anymore refills. Thank you  . Multiple Vitamins-Minerals (PRESERVISION AREDS 2 PO) Take 1 tablet by mouth 2 (two) times daily.   Marland Kitchen MYRBETRIQ 25 MG TB24 tablet TAKE 1 TABLET BY MOUTH EVERY DAY  . omeprazole (PRILOSEC) 20 MG capsule Take 1 capsule (20 mg total) by mouth daily.  Marland Kitchen oxyCODONE-acetaminophen (PERCOCET/ROXICET) 5-325 MG tablet Take 0.5 tablets by mouth as needed for severe pain.  . pregabalin (LYRICA) 25 MG  capsule Take 2 capsules (50 mg total) by mouth at bedtime.  . ramipril (ALTACE) 10 MG capsule TAKE 1 CAPSULE (10 MG TOTAL) BY MOUTH EVERY EVENING.  Daryll Brod INSULIN SYRINGE 1ML/31G 31G X 5/16" 1 ML MISC Use as directed  . rOPINIRole (REQUIP) 1 MG tablet TAKE ONE TABLET BY MOUTH AT BEDTIME.  . rosuvastatin (CRESTOR) 10 MG tablet Take 1 tablet (10 mg total) by mouth daily.  Marland Kitchen senna (SENOKOT) 8.6 MG tablet Take 1 tablet by mouth daily.  . verapamil (CALAN) 120 MG tablet Take 120 mg by mouth daily as needed (afib).   . verapamil (VERELAN PM) 240 MG 24  hr capsule TAKE 1 CAPSULE BY MOUTH EVERY DAY  . VITAMIN D PO Take 10,000 Units by mouth daily. 10,000 in the am and 5,000 in the pm     Allergies:   Amoxil [amoxicillin], Lisinopril, Chocolate, Demerol, and Other   Social History   Socioeconomic History  . Marital status: Widowed    Spouse name: Glendell Docker  . Number of children: 2  . Years of education: Not on file  . Highest education level: Not on file  Occupational History  . Not on file  Tobacco Use  . Smoking status: Former Research scientist (life sciences)  . Smokeless tobacco: Never Used  . Tobacco comment: smoke 1 cigarette every few weeks when the [place] lady came by.  Vaping Use  . Vaping Use: Never used  Substance and Sexual Activity  . Alcohol use: No  . Drug use: No  . Sexual activity: Not on file  Other Topics Concern  . Not on file  Social History Narrative   Lives home alone.  Daughter [name] with her today.   Independent of ADLs.  Education HS.  Widowed.     Social Determinants of Health   Financial Resource Strain: Low Risk   . Difficulty of Paying Living Expenses: Not hard at all  Food Insecurity:   . Worried About Charity fundraiser in the Last Year: Not on file  . Ran Out of Food in the Last Year: Not on file  Transportation Needs: No Transportation Needs  . Lack of Transportation (Medical): No  . Lack of Transportation (Non-Medical): No  Physical Activity:   . Days of Exercise per  Week: Not on file  . Minutes of Exercise per Session: Not on file  Stress:   . Feeling of Stress : Not on file  Social Connections:   . Frequency of Communication with Friends and Family: Not on file  . Frequency of Social Gatherings with Friends and Family: Not on file  . Attends Religious Services: Not on file  . Active Member of Clubs or Organizations: Not on file  . Attends Archivist Meetings: Not on file  . Marital Status: Not on file     Family History: The patient's family history includes Heart failure in her mother; Hyperlipidemia in her maternal grandmother and mother; Lung cancer in her father; Peripheral vascular disease in her maternal grandmother.  ROS:   Please see the history of present illness.     All other systems reviewed and are negative.  EKGs/Labs/Other Studies Reviewed:    The following studies were reviewed today: Cardiac catheterization 2017-no coronary artery disease. Echocardiogram-EF 60   EKG:  EKG is  ordered today.  The ekg ordered today demonstrates 71 sinus rhythm nonspecific ST-T wave changes.  Recent Labs: 09/03/2019: TSH 1.25 09/05/2019: BUN 30; Creat 1.24; Hemoglobin 13.8; Platelets 198; Potassium 4.4; Sodium 133  Recent Lipid Panel    Component Value Date/Time   CHOL 183 09/17/2019 0758   TRIG 539 (H) 09/17/2019 0758   HDL 32 (L) 09/17/2019 0758   CHOLHDL 5.7 (H) 09/17/2019 0758   VLDL 73 (H) 02/15/2015 0514   LDLCALC  09/17/2019 0758     Comment:     . LDL cholesterol not calculated. Triglyceride levels greater than 400 mg/dL invalidate calculated LDL results. . Reference range: <100 . Desirable range <100 mg/dL for primary prevention;   <70 mg/dL for patients with CHD or diabetic patients  with > or = 2 CHD risk factors. Marland Kitchen LDL-C is now calculated using  the Martin-Hopkins  calculation, which is a validated novel method providing  better accuracy than the Friedewald equation in the  estimation of LDL-C.  Cresenciano Genre  et al. Annamaria Helling. 6948;546(27): 2061-2068  (http://education.[employer].com/faq/FAQ164)     Physical Exam:    VS:  BP 128/70   Pulse 71   Ht 5' 1"  (1.549 m)   Wt 205 lb (93 kg)   BMI 38.73 kg/m     Wt Readings from Last 3 Encounters:  11/05/19 205 lb (93 kg)  09/17/19 203 lb 4 oz (92.2 kg)  09/10/19 (!) 203 lb (92.1 kg)     GEN:  Well nourished, well developed in no acute distress HEENT: Normal NECK: No JVD; No carotid bruits LYMPHATICS: No lymphadenopathy CARDIAC: RRR, no murmurs, rubs, gallops RESPIRATORY:  Clear to auscultation without rales, wheezing or rhonchi  ABDOMEN: Soft, non-tender, non-distended MUSCULOSKELETAL:  No edema; finger deformities noted. SKIN: Warm and dry NEUROLOGIC:  Alert and oriented x 3 PSYCHIATRIC:  Normal affect   ASSESSMENT:    1. SVT (supraventricular tachycardia) ([place])   2. Essential hypertension, benign   3. Aortic atherosclerosis ([place])   4. Coronary artery calcification    PLAN:    In order of problems listed above:  Paroxysmal supraventricular tachycardia -Been taking verapamil digoxin metoprolol.  Continue to prescribe these medications.  Since we are using low doses of digoxin, no need for digoxin levels.  Overall been feeling very well with this.  She has been on this regimen for many years and has been stable.  No changes.  Scattered small foci of coronary calcification -Continue with statin therapy.  Prior catheterization showed no obstructive disease.  Lower extremity edema -Ultrasound 09/18/19 was negative for DVT.  D-dimer is mildly elevated.  CT scan showed no evidence of PE.  Hyperlipidemia -In review of outside office messages, she was switched over to Crestor 10 mg.  Hopefully this will be better tolerated.  Agree with this prescription.  Diabetes with hypertension -Dr. [name] has been monitoring closely.  Last hemoglobin A1c 7.7.  Medications reviewed as above.  No changes made.   Medication Adjustments/Labs  and Tests Ordered: Current medicines are reviewed at length with the patient today.  Concerns regarding medicines are outlined above.  Orders Placed This Encounter  Procedures  . EKG 12-Lead   No orders of the defined types were placed in this encounter.   Patient Instructions  Medication Instructions:  The current medical regimen is effective;  continue present plan and medications.  *If you need a refill on your cardiac medications before your next appointment, please call your pharmacy*  Follow-Up: At [facility], you and your health needs are our priority.  As part of our continuing mission to provide you with exceptional heart care, we have created designated Provider Care Teams.  These Care Teams include your primary Cardiologist (physician) and Advanced Practice Providers (APPs -  Physician Assistants and Nurse Practitioners) who all work together to provide you with the care you need, when you need it.  We recommend signing up for the patient portal called "MyChart".  Sign up information is provided on this After Visit Summary.  MyChart is used to connect with patients for Virtual Visits (Telemedicine).  Patients are able to view lab/test results, encounter notes, upcoming appointments, etc.  Non-urgent messages can be sent to your provider as well.   To learn more about what you can do with MyChart, go to [email or web address].    Your next appointment:   12 month(s)  The format for your next appointment:   In Person  Provider:   Candee Furbish, MD  Thank you for choosing [facility]!!        Signed, Candee Furbish, MD  11/05/2019 10:26 AM    [place]

## 2019-11-05 NOTE — Patient Instructions (Signed)
Medication Instructions:  The current medical regimen is effective;  continue present plan and medications.  *If you need a refill on your cardiac medications before your next appointment, please call your pharmacy*  Follow-Up: At [facility], you and your health needs are our priority.  As part of our continuing mission to provide you with exceptional heart care, we have created designated Provider Care Teams.  These Care Teams include your primary Cardiologist (physician) and Advanced Practice Providers (APPs -  Physician Assistants and Nurse Practitioners) who all work together to provide you with the care you need, when you need it.  We recommend signing up for the patient portal called "MyChart".  Sign up information is provided on this After Visit Summary.  MyChart is used to connect with patients for Virtual Visits (Telemedicine).  Patients are able to view lab/test results, encounter notes, upcoming appointments, etc.  Non-urgent messages can be sent to your provider as well.   To learn more about what you can do with MyChart, go to [email or web address].    Your next appointment:   12 month(s)  The format for your next appointment:   In Person  Provider:   Mark Skains, MD   Thank you for choosing  HeartCare!!      

## 2019-11-06 DIAGNOSIS — M4804 Spinal stenosis, thoracic region: Secondary | ICD-10-CM | POA: Diagnosis not present

## 2019-11-07 DIAGNOSIS — N3946 Mixed incontinence: Secondary | ICD-10-CM | POA: Diagnosis not present

## 2019-11-07 DIAGNOSIS — R351 Nocturia: Secondary | ICD-10-CM | POA: Diagnosis not present

## 2019-11-07 DIAGNOSIS — N760 Acute vaginitis: Secondary | ICD-10-CM | POA: Diagnosis not present

## 2019-11-11 ENCOUNTER — Other Ambulatory Visit: Payer: Self-pay

## 2019-11-11 ENCOUNTER — Ambulatory Visit (INDEPENDENT_AMBULATORY_CARE_PROVIDER_SITE_OTHER): Payer: PPO

## 2019-11-11 DIAGNOSIS — Z0000 Encounter for general adult medical examination without abnormal findings: Secondary | ICD-10-CM

## 2019-11-11 NOTE — Patient Instructions (Signed)
[name] , Thank you for taking time to come for your Medicare Wellness Visit. I appreciate your ongoing commitment to your health goals. Please review the following plan we discussed and let me know if I can assist you in the future.   Screening recommendations/referrals: Colonoscopy: Done 12/21/10 Mammogram: Done 02/15/16 Bone Density: Done 06/14/15 Recommended yearly ophthalmology/optometry visit for glaucoma screening and checkup Recommended yearly dental visit for hygiene and checkup  Vaccinations: Influenza vaccine: due and discussed Pneumococcal vaccine: Up to date Tdap vaccine: Due and discussed Shingles vaccine: Shingrix discussed. Please contact your pharmacy for coverage information.    Covid-19:Completed 2/12 & 04/22/19  Advanced directives: Please bring a copy of your health care power of attorney and living will to the office at your convenience.   Conditions/risks identified: Be able to sew and drive  Next appointment: Follow up in one year for your annual wellness visit    Preventive Care 65 Years and Older, Female Preventive care refers to lifestyle choices and visits with your health care provider that can promote health and wellness. What does preventive care include?  A yearly physical exam. This is also called an annual well check.  Dental exams once or twice a year.  Routine eye exams. Ask your health care provider how often you should have your eyes checked.  Personal lifestyle choices, including:  Daily care of your teeth and gums.  Regular physical activity.  Eating a healthy diet.  Avoiding tobacco and drug use.  Limiting alcohol use.  Practicing safe sex.  Taking low-dose aspirin every day.  Taking vitamin and mineral supplements as recommended by your health care provider. What happens during an annual well check? The services and screenings done by your health care provider during your annual well check will depend on your age, overall  health, lifestyle risk factors, and family history of disease. Counseling  Your health care provider may ask you questions about your:  Alcohol use.  Tobacco use.  Drug use.  Emotional well-being.  Home and relationship well-being.  Sexual activity.  Eating habits.  History of falls.  Memory and ability to understand (cognition).  Work and work Statistician.  Reproductive health. Screening  You may have the following tests or measurements:  Height, weight, and BMI.  Blood pressure.  Lipid and cholesterol levels. These may be checked every 5 years, or more frequently if you are over 8 years old.  Skin check.  Lung cancer screening. You may have this screening every year starting at age 57 if you have a 30-pack-year history of smoking and currently smoke or have quit within the past 15 years.  Fecal occult blood test (FOBT) of the stool. You may have this test every year starting at age 16.  Flexible sigmoidoscopy or colonoscopy. You may have a sigmoidoscopy every 5 years or a colonoscopy every 10 years starting at age 91.  Hepatitis C blood test.  Hepatitis B blood test.  Sexually transmitted disease (STD) testing.  Diabetes screening. This is done by checking your blood sugar (glucose) after you have not eaten for a while (fasting). You may have this done every 1-3 years.  Bone density scan. This is done to screen for osteoporosis. You may have this done starting at age 76.  Mammogram. This may be done every 1-2 years. Talk to your health care provider about how often you should have regular mammograms. Talk with your health care provider about your test results, treatment options, and if necessary, the need for  more tests. Vaccines  Your health care provider may recommend certain vaccines, such as:  Influenza vaccine. This is recommended every year.  Tetanus, diphtheria, and acellular pertussis (Tdap, Td) vaccine. You may need a Td booster every 10  years.  Zoster vaccine. You may need this after age 34.  Pneumococcal 13-valent conjugate (PCV13) vaccine. One dose is recommended after age 1.  Pneumococcal polysaccharide (PPSV23) vaccine. One dose is recommended after age 11. Talk to your health care provider about which screenings and vaccines you need and how often you need them. This information is not intended to replace advice given to you by your health care provider. Make sure you discuss any questions you have with your health care provider. Document Released: 02/26/2015 Document Revised: 10/20/2015 Document Reviewed: 12/01/2014 Elsevier Interactive Patient Education  2017 [employer] Prevention in the Home Falls can cause injuries. They can happen to people of all ages. There are many things you can do to make your home safe and to help prevent falls. What can I do on the outside of my home?  Regularly fix the edges of walkways and driveways and fix any cracks.  Remove anything that might make you trip as you walk through a door, such as a raised step or threshold.  Trim any bushes or trees on the path to your home.  Use bright outdoor lighting.  Clear any walking paths of anything that might make someone trip, such as rocks or tools.  Regularly check to see if handrails are loose or broken. Make sure that both sides of any steps have handrails.  Any raised decks and porches should have guardrails on the edges.  Have any leaves, snow, or ice cleared regularly.  Use sand or salt on walking paths during winter.  Clean up any spills in your garage right away. This includes oil or grease spills. What can I do in the bathroom?  Use night lights.  Install grab bars by the toilet and in the tub and shower. Do not use towel bars as grab bars.  Use non-skid mats or decals in the tub or shower.  If you need to sit down in the shower, use a plastic, non-slip stool.  Keep the floor dry. Clean up any water that  spills on the floor as soon as it happens.  Remove soap buildup in the tub or shower regularly.  Attach bath mats securely with double-sided non-slip rug tape.  Do not have throw rugs and other things on the floor that can make you trip. What can I do in the bedroom?  Use night lights.  Make sure that you have a light by your bed that is easy to reach.  Do not use any sheets or blankets that are too big for your bed. They should not hang down onto the floor.  Have a firm chair that has side arms. You can use this for support while you get dressed.  Do not have throw rugs and other things on the floor that can make you trip. What can I do in the kitchen?  Clean up any spills right away.  Avoid walking on wet floors.  Keep items that you use a lot in easy-to-reach places.  If you need to reach something above you, use a strong step stool that has a grab bar.  Keep electrical cords out of the way.  Do not use floor polish or wax that makes floors slippery. If you must use wax, use non-skid  floor wax.  Do not have throw rugs and other things on the floor that can make you trip. What can I do with my stairs?  Do not leave any items on the stairs.  Make sure that there are handrails on both sides of the stairs and use them. Fix handrails that are broken or loose. Make sure that handrails are as long as the stairways.  Check any carpeting to make sure that it is firmly attached to the stairs. Fix any carpet that is loose or worn.  Avoid having throw rugs at the top or bottom of the stairs. If you do have throw rugs, attach them to the floor with carpet tape.  Make sure that you have a light switch at the top of the stairs and the bottom of the stairs. If you do not have them, ask someone to add them for you. What else can I do to help prevent falls?  Wear shoes that:  Do not have high heels.  Have rubber bottoms.  Are comfortable and fit you well.  Are closed at the  toe. Do not wear sandals.  If you use a stepladder:  Make sure that it is fully opened. Do not climb a closed stepladder.  Make sure that both sides of the stepladder are locked into place.  Ask someone to hold it for you, if possible.  Clearly mark and make sure that you can see:  Any grab bars or handrails.  First and last steps.  Where the edge of each step is.  Use tools that help you move around (mobility aids) if they are needed. These include:  Canes.  Walkers.  Scooters.  Crutches.  Turn on the lights when you go into a dark area. Replace any light bulbs as soon as they burn out.  Set up your furniture so you have a clear path. Avoid moving your furniture around.  If any of your floors are uneven, fix them.  If there are any pets around you, be aware of where they are.  Review your medicines with your doctor. Some medicines can make you feel dizzy. This can increase your chance of falling. Ask your doctor what other things that you can do to help prevent falls. This information is not intended to replace advice given to you by your health care provider. Make sure you discuss any questions you have with your health care provider. Document Released: 11/26/2008 Document Revised: 07/08/2015 Document Reviewed: 03/06/2014 Elsevier Interactive Patient Education  2017 [employer].

## 2019-11-11 NOTE — Progress Notes (Signed)
Virtual Visit via Telephone Note  I connected with  [name] on 11/11/19 at  9:30 AM EDT by telephone and verified that I am speaking with the correct person using two identifiers.  Medicare Annual Wellness visit completed telephonically due to Covid-19 pandemic.   Persons participating in this call: This Health Coach and this patient and [name]  Location: Patient: Home Provider: Office   I discussed the limitations, risks, security and privacy concerns of performing an evaluation and management service by telephone and the availability of in person appointments. The patient expressed understanding and agreed to proceed.  Unable to perform video visit due to video visit attempted and failed and/or patient does not have video capability.   Some vital signs may be absent or patient reported.   Willette Brace, LPN    Subjective:   [name] is a 79 y.o. female who presents for Medicare Annual (Subsequent) preventive examination.  Review of Systems     Cardiac Risk Factors include: advanced age (>24mn, >>92women);diabetes mellitus;dyslipidemia;hypertension;obesity (BMI >30kg/m2);sedentary lifestyle     Objective:    Today's Vitals   11/11/19 0902  PainSc: 7    There is no height or weight on file to calculate BMI.  Advanced Directives 11/11/2019 01/06/2019 05/27/2018 04/11/2018 01/22/2018 01/22/2018 09/20/2017  Does Patient Have a Medical Advance Directive? Yes Yes Yes No No No Yes  Type of [place] Healthcare Power of [place]  Does patient want to make changes to medical advance directive? - - - - - - No - Patient declined  Copy of HOliviain Chart? No - copy requested No - copy requested Yes - validated most recent copy scanned in chart (See row information) - - - -  Would patient like information on creating a medical advance directive?  - - - No - Patient declined No - Patient declined No - Patient declined -  Pre-existing out of facility DNR order (yellow form or pink MOST form) - - - - - - -    Current Medications (verified) Outpatient Encounter Medications as of 11/11/2019  Medication Sig  . acetaminophen (TYLENOL) 325 MG tablet Take 325 mg by mouth every 6 (six) hours as needed.  .Marland Kitchenaspirin EC 81 MG tablet Take 81 mg by mouth at bedtime.   . Calcium Carb-Cholecalciferol (CALCIUM 600+D3) 600-200 MG-UNIT TABS Take 1 tablet by mouth in the morning and at bedtime.   . carbamide peroxide (DEBROX) 6.5 % OTIC solution Place 5 drops into the left ear 2 (two) times daily.  . Continuous Blood Gluc Receiver (DEXCOM G6 RECEIVER) DEVI 1 Device by Does not apply route daily.  . Continuous Blood Gluc Sensor (DEXCOM G6 SENSOR) MISC 1 kit by Does not apply route as directed.  . Continuous Blood Gluc Transmit (DEXCOM G6 TRANSMITTER) MISC 1 kit by Does not apply route as directed.  . digoxin (LANOXIN) 0.125 MG tablet Take 1 tablet (0.125 mg total) by mouth daily. Please keep upcoming appt with Dr. SMarlou Porchin September before anymore refills. Thank you  . docusate sodium (COLACE) 100 MG capsule Take 100 mg by mouth daily. twice a day  . DULoxetine (CYMBALTA) 30 MG capsule Take 2 capsules (60 mg total) by mouth daily.  .Marland Kitchenezetimibe (ZETIA) 10 MG tablet TAKE 1 TABLET BY MOUTH EVERY DAY  . glucose blood test strip Use as instructed to test blood sugars 3  times daily [id number]  . insulin aspart (NOVOLOG FLEXPEN) 100 UNIT/ML FlexPen Inject 10 Units into the skin daily with breakfast AND 10 Units daily with lunch AND 12 Units daily with supper. Max daily 60 units per correction scale.  . insulin glargine (LANTUS SOLOSTAR) 100 UNIT/ML Solostar Pen Inject 20 Units into the skin daily.  . Insulin Pen Needle (BD PEN NEEDLE MICRO U/F) 32G X 6 MM MISC 4 TIMES A DAY NOVOLOG(3) AND LANTUS9(1)  . meloxicam (MOBIC) 15 MG tablet TAKE 1 TABLET BY MOUTH EVERY DAY    . metoprolol succinate (TOPROL-XL) 25 MG 24 hr tablet Take 1 tablet (25 mg total) by mouth daily. Please keep upcoming appt in September with Dr. Marlou Porch before anymore refills. Thank you  . Multiple Vitamins-Minerals (PRESERVISION AREDS 2 PO) Take 1 tablet by mouth 2 (two) times daily.   Marland Kitchen MYRBETRIQ 25 MG TB24 tablet TAKE 1 TABLET BY MOUTH EVERY DAY  . omeprazole (PRILOSEC) 20 MG capsule Take 1 capsule (20 mg total) by mouth daily.  Marland Kitchen oxyCODONE-acetaminophen (PERCOCET/ROXICET) 5-325 MG tablet Take 0.5 tablets by mouth as needed for severe pain.  . pregabalin (LYRICA) 25 MG capsule Take 2 capsules (50 mg total) by mouth at bedtime.  Marland Kitchen PREMARIN vaginal cream Place vaginally.  . ramipril (ALTACE) 10 MG capsule TAKE 1 CAPSULE (10 MG TOTAL) BY MOUTH EVERY EVENING.  Daryll Brod INSULIN SYRINGE 1ML/31G 31G X 5/16" 1 ML MISC Use as directed  . rOPINIRole (REQUIP) 1 MG tablet TAKE ONE TABLET BY MOUTH AT BEDTIME.  . rosuvastatin (CRESTOR) 10 MG tablet Take 1 tablet (10 mg total) by mouth daily.  Marland Kitchen senna (SENOKOT) 8.6 MG tablet Take 1 tablet by mouth daily.  . verapamil (CALAN) 120 MG tablet Take 120 mg by mouth daily as needed (afib).   . verapamil (VERELAN PM) 240 MG 24 hr capsule TAKE 1 CAPSULE BY MOUTH EVERY DAY  . VITAMIN D PO Take 10,000 Units by mouth daily. 10,000 in the am and 5,000 in the pm  . [DISCONTINUED] Cranberry 450 MG CAPS Take 900 mg by mouth daily.  (Patient not taking: Reported on 11/11/2019)   No facility-administered encounter medications on file as of 11/11/2019.    Allergies (verified) Amoxil [amoxicillin], Lisinopril, Chocolate, Demerol, and Other   History: Past Medical History:  Diagnosis Date  . Anemia    during 1st pregnancy  . Anxiety   . Arthritis   . Cancer ([place])    Basal - face  . Complication of anesthesia   . Depression   . Diarrhea, functional   . Dysrhythmia    Paroysmal Atrial Tachycardia  . Essential hypertension   . Fibromyalgia   . GERD  (gastroesophageal reflux disease)   . H/O acute pancreatitis   . Headache   . History of hiatal hernia   . Hyperlipemia   . Neuropathy   . Osteoporosis   . Panic attacks   . Paralysis ([place]) 01/06/2019   hands  . PAT (paroxysmal atrial tachycardia) ([place])   . Pneumonia   . PONV (postoperative nausea and vomiting)   . PSVT (paroxysmal supraventricular tachycardia) ([place])   . Reflux   . Restless leg syndrome   . Spinal cord compression ([place])   . Type 2 diabetes mellitus ([facility])    Type II  . Vertigo    not current   Past Surgical History:  Procedure Laterality Date  . ABDOMINAL HYSTERECTOMY    . ANKLE SURGERY Left    x 2  .  ANTERIOR CERVICAL DECOMP/DISCECTOMY FUSION N/A 11/12/2017   Procedure: Cervical seven to Thoracic one  Anterior cervical decompression/discectomy/fusion with removal of old plate;  Surgeon: [name], MD;  Location: [facility];  Service: Neurosurgery;  Laterality: N/A;  . BACK SURGERY     T12-L1 fusion, Anterior Cervical fusion  . CARDIAC CATHETERIZATION N/A 02/16/2015   Procedure: Left Heart Cath and Coronary Angiography;  Surgeon: Sherren Mocha, MD;  Location: [facility];  Service: Cardiovascular;  Laterality: N/A;  . CHOLECYSTECTOMY    . COLONOSCOPY    . ESOPHAGOGASTRODUODENOSCOPY  12/21/2010   Procedure: ESOPHAGOGASTRODUODENOSCOPY (EGD);  Surgeon: Landry Dyke, MD;  Location: [facility];  Service: Endoscopy;  Laterality: N/A;  . EXCISION MORTON'S NEUROMA Right   . EYE SURGERY Bilateral    Cataract  . FRACTURE SURGERY     ankle x 2   . KNEE SURGERY Right    x 3- arthroscopy  . NECK SURGERY    . POSTERIOR CERVICAL FUSION/FORAMINOTOMY N/A 01/22/2018   Procedure: Cervical six-seven Posterior Decompression with Posterior Fixation Cervical Five to Thoracic Two;  Surgeon: [name], MD;  Location: [facility];  Service: Neurosurgery;  Laterality: N/A;  Cervical 6-7 Posteror decompression with posterior fixation Cervical 5 to Thoracic 2  . ROTATOR CUFF  REPAIR Right   . TONSILLECTOMY    . TOTAL KNEE ARTHROPLASTY Left 04/10/2017   Procedure: LEFT TOTAL KNEE ARTHROPLASTY;  Surgeon: Paralee Cancel, MD;  Location: [facility];  Service: Orthopedics;  Laterality: Left;  70 mins  . UPPER GASTROINTESTINAL ENDOSCOPY    . WRIST SURGERY Left    fracture   Family History  Problem Relation Age of Onset  . Heart failure Mother   . Hyperlipidemia Mother   . Lung cancer Father        Lymphoma  . Hyperlipidemia Maternal Grandmother   . Peripheral vascular disease Maternal Grandmother    Social History   Socioeconomic History  . Marital status: Widowed    Spouse name: Glendell Docker  . Number of children: 2  . Years of education: Not on file  . Highest education level: Not on file  Occupational History  . Occupation: Retired  Tobacco Use  . Smoking status: Former Research scientist (life sciences)  . Smokeless tobacco: Never Used  . Tobacco comment: smoke 1 cigarette every few weeks when the [place] lady came by.  Vaping Use  . Vaping Use: Never used  Substance and Sexual Activity  . Alcohol use: No  . Drug use: No  . Sexual activity: Not on file  Other Topics Concern  . Not on file  Social History Narrative   Lives home alone.  [name] with her today.   Independent of ADLs.  Education HS.  Widowed.     Social Determinants of Health   Financial Resource Strain: Low Risk   . Difficulty of Paying Living Expenses: Not hard at all  Food Insecurity: No Food Insecurity  . Worried About Charity fundraiser in the Last Year: Never true  . Ran Out of Food in the Last Year: Never true  Transportation Needs: No Transportation Needs  . Lack of Transportation (Medical): No  . Lack of Transportation (Non-Medical): No  Physical Activity: Inactive  . Days of Exercise per Week: 0 days  . Minutes of Exercise per Session: 0 min  Stress: Stress Concern Present  . Feeling of Stress : Rather much  Social Connections: Moderately Isolated  . Frequency of Communication with Friends and  Family: More than three times  a week  . Frequency of Social Gatherings with Friends and Family: Never  . Attends Religious Services: 1 to 4 times per year  . Active Member of Clubs or Organizations: No  . Attends Archivist Meetings: Never  . Marital Status: Widowed    Tobacco Counseling Counseling given: Not Answered Comment: smoke 1 cigarette every few weeks when the [place] lady came by.   Clinical Intake:  Pre-visit preparation completed: Yes  Pain : 0-10 Pain Score: 7  Pain Location: Hip (and legs) Pain Descriptors / Indicators: Shooting, Aching Pain Onset: More than a month ago Pain Frequency: Intermittent     BMI - recorded: 38.73 Nutritional Status: BMI > 30  Obese Nutritional Risks: None Diabetes: Yes CBG done?: Yes (150) CBG resulted in Enter/ Edit results?: No Did pt. bring in CBG monitor from home?: No  How often do you need to have someone help you when you read instructions, pamphlets, or other written materials from your doctor or pharmacy?: 1 - Never  Diabetic?Yes  Interpreter Needed?: No  Information entered by :: Charlott Rakes, LPN   Activities of Daily Living In your present state of health, do you have any difficulty performing the following activities: 11/11/2019 01/06/2019  Hearing? Y N  Comment mild loss -  Vision? N N  Difficulty concentrating or making decisions? N Y  Walking or climbing stairs? Y Y  Comment - up pain  Dressing or bathing? Tempie Donning  Comment have assistance -  Doing errands, shopping? Tempie Donning  Comment Leah valerie assist Unable to walk without assistance  Preparing Food and eating ? Y -  Comment assistance -  Using the Toilet? N -  In the past six months, have you accidently leaked urine? Y -  Comment frequency and incontinence -  Do you have problems with loss of bowel control? N -  Managing your Medications? Y -  Comment valerie assist -  Managing your Finances? N -  Housekeeping or managing your  Housekeeping? Y -  Some recent data might be hidden    Patient Care Team: [name], [name], MD as PCP - General (Family Medicine) Jerline Pain, MD as PCP - Cardiology (Cardiology) Earnie Larsson, [facility] as Pharmacist (Pharmacist)  Indicate any recent Medical Services you may have received from other than [facility] providers in the past year (date may be approximate).     Assessment:   This is a routine wellness examination for [place].  Hearing/Vision screen  Hearing Screening   125Hz  250Hz  500Hz  1000Hz  2000Hz  3000Hz  4000Hz  6000Hz  8000Hz   Right ear:           Left ear:           Comments: Pt states her family says she does states mild loss but not enough for hearing aids  Vision Screening Comments: Follows up with Dr Roderic Palau scott for eye exams  Dietary issues and exercise activities discussed: Current Exercise Habits: The patient does not participate in regular exercise at present, Exercise limited by: orthopedic condition(s)  Goals    . Patient Stated     To feel better     . Patient Stated     Be able to sew and to drive    . Pharmacy Care Plan     CARE PLAN ENTRY (see longitudinal plan of care for additional care plan information)  Current Barriers:  . Chronic Disease Management support, education, and care coordination needs related to Hypertension, Hyperlipidemia, Diabetes, Osteoarthritis, and dysuria, paroxysmal atrial  tachycardia   Hypertension BP Readings from Last 3 Encounters:  08/06/19 135/85  06/06/19 128/84  05/05/19 122/78   . Pharmacist Clinical Goal(s): o Over the next 90 days, patient will work with PharmD and providers to maintain BP goal <140/90 . Current regimen:   Metoprolol succinate (Toprol- XL) 43m, 1 tablet once daily   Ramipril 177m 1 capsule every evening   Verapamil 24056m1 capsule once daily  . Interventions: o Discussed diet modifications. DASH diet:  following a diet emphasizing fruits and vegetables and low-fat dairy products  along with whole grains, fish, poultry, and nuts.  . Patient self care activities - Over the next 90 days, patient will: o Check BP 1 to 2 times per week, document, and provide at future appointments o Ensure daily salt intake < 2300 mg/day  Hyperlipidemia Lab Results  Component Value Date/Time   LDLCALC 82 01/20/2016 12:00 AM   . Pharmacist Clinical Goal(s): o Over the next 90 days, patient will work with PharmD and providers to maintain LDL goal < 100 . Current regimen:  o Ezetimibe 45m46m tablet once daily  . Interventions: o How a diet high in plant sterols (fruits/vegetables/nuts/whole grains/legumes) may reduce your cholesterol.  Encouraged increasing fiber to a daily intake of 10-25g/day  . Patient self care activities - Over the next 90 days, patient will: o Continue current medications and diet modifications.   Diabetes Lab Results  Component Value Date/Time   HGBA1C 7.3 (A) 05/05/2019 10:52 AM   HGBA1C 7.6 (A) 01/03/2019 01:33 PM   HGBA1C 7.6 (H) 11/12/2017 10:29 AM   HGBA1C 6.7 (H) 09/04/2017 12:12 PM   HGBA1C 7.7 01/20/2016 12:00 AM   . Pharmacist Clinical Goal(s): o Over the next 90 days, patient will work with PharmD and providers to achieve A1c goal <7% or per your endocrinologist.  . Current regimen:   Insulin aspart (Novolog Flexpen), inject 8 units daily with breakfast, 10 units with lunch, 10 units with supper (max daily: 50 units per correction scale)   Insulin glargine (Lantus Solostar), inject 20 units once daily  . Interventions: o We discussed: how to recognize and treat signs of hypoglycemia . Maintain a low carbohydrate diet, eating 45 grams of carbohydrates per meal (3 servings of carbohydrates per meal), 15 grams of carbohydrates per snack (1 serving of carbohydrate per snack).  . Patient self care activities - Over the next 90 days, patient will: o Check blood sugar as instructed, document, and provide at future appointments o Contact provider  with any episodes of hypoglycemia  Osteoarthritis . Pharmacist Clinical Goal(s) o Over the next 90 days, patient will work with PharmD and providers to minimize pain level.  . Current regimen:  . Meloxicam 15mg74mtablet once daily (takes with breakfast) . Oxycontin 5/325mg,30m tablet  as needed (more severe pain)  . APAP 325mg, 70m 2 tablets every six hours as needed . Interventions: o The maximum daily dose of acetaminophen was discussed with the patient. She was encouraged not to exceed 3,000 mg of acetaminophen during a 24 hour period and was asked to keep in mind that acetaminophen can also be found in many over-the-counter cold medications as well as narcotics that may be given for pain.  . Patient self care activities - Over the next 90 days, patient will: o Discuss patient management with Dr. RaulkerAdam Phenix options for pain: celecoxib (Celebrex)).   Dysuria . Pharmacist Clinical Goal(s) o Over the next 90 days, patient will work with  PharmD and providers to minimize urination difficulties.  . Current regimen:  o mirabegron (Myrbetriq) 17m, 1 tablet once daily  . Interventions: o Discussed possibility of dose increase of mirabegron to 578m  . Marland Kitchenatient self care activities o Patient will discuss further with Dr. JoMartiniquet next visit.   Paroxysmal atrial tachycardia . Pharmacist Clinical Goal(s) o Over the next 90 days, patient will work with PharmD and providers to maintain normal sinus rhythm.  . Current regimen:   Digoxin 0.12551m1 tablet one daily   Metoprolol succinate (Toprol- XL) 56m60m tablet once daily   Verapamil 120mg45mtablet as needed    Verapamil 240mg,11mapsule once daily  . Patient self care activities o Patient will continue current medications and follow up visits with Dr. SkainsMarlou Porchdication management . Pharmacist Clinical Goal(s): o Over the next 90 days, patient will work with PharmD and providers to maintain optimal medication  adherence . Current pharmacy: [facility] . Interventions o Comprehensive medication review performed. o Continue current medication management strategy . Patient self care activities - Over the next 90 days, patient will: o Take medications as prescribed o Report any questions or concerns to PharmD and/or provider(s)  Initial goal documentation       Depression Screen PHQ 2/9 Scores 11/11/2019 04/08/2019 09/28/2018 08/23/2018 10/19/2017 09/05/2017 03/27/2016  PHQ - 2 Score 2 6 1  0 3 0 0  PHQ- 9 Score 6 23 - - 20 - -    Fall Risk Fall Risk  11/11/2019 08/06/2019 04/08/2019 09/28/2018 08/23/2018  Falls in the past year? 1 0 0 1 1  Number falls in past yr: 1 - - 1 1  Injury with Fall? 0 - - 1 1  Risk Factor Category  - - - - -  Risk for fall due to : Impaired vision;Impaired mobility;Impaired balance/gait;History of fall(s) - - Impaired balance/gait;Orthopedic patient Orthopedic patient;Impaired balance/gait  Risk for fall due to: Comment - - - - -  Follow up Falls prevention discussed - - Education provided Education provided    Any stairs in or around the home? No  If so, are there any without handrails? No  Home free of loose throw rugs in walkways, pet beds, electrical cords, etc? Yes  Adequate lighting in your home to reduce risk of falls? Yes   ASSISTIVE DEVICES UTILIZED TO PREVENT FALLS:  Life alert? Yes  Use of a cane, walker or w/c? Yes  Grab bars in the bathroom? Yes  Shower chair or bench in shower? Yes  Elevated toilet seat or a handicapped toilet? Yes   TIMED UP AND GO:  Was the test performed? No .  Cognitive Function: MMSE - Mini Mental State Exam 09/05/2017  Not completed: (No Data)     6CIT Screen 11/11/2019  What Year? 0 points  What month? 0 points  Count back from 20 0 points  Months in reverse 0 points  Repeat phrase 0 points    Immunizations Immunization History  Administered Date(s) Administered  . Fluad Quad(high Dose 65+) 11/08/2018  . Influenza, High  Dose Seasonal PF 11/10/2016, 12/27/2017  . PFIZER SARS-COV-2 Vaccination 03/28/2019, 04/22/2019  . Pneumococcal Conjugate-13 08/13/2012  . Pneumococcal Polysaccharide-23 08/23/2007    TDAP status: Due, Education has been provided regarding the importance of this vaccine. Advised may receive this vaccine at local pharmacy or Health Dept. Aware to provide a copy of the vaccination record if obtained from local pharmacy or Health Dept. Verbalized acceptance and understanding. Flu  Vaccine status: Declined, Education has been provided regarding the importance of this vaccine but patient still declined. Advised may receive this vaccine at local pharmacy or Health Dept. Aware to provide a copy of the vaccination record if obtained from local pharmacy or Health Dept. Verbalized acceptance and understanding. Pneumococcal vaccine status: Up to date Covid-19 vaccine status: Completed vaccines  Qualifies for Shingles Vaccine? Yes   Zostavax completed No   Shingrix Completed?: No.    Education has been provided regarding the importance of this vaccine. Patient has been advised to call insurance company to determine out of pocket expense if they have not yet received this vaccine. Advised may also receive vaccine at local pharmacy or Health Dept. Verbalized acceptance and understanding.  Screening Tests Health Maintenance  Topic Date Due  . INFLUENZA VACCINE  12/17/2019 (Originally 09/14/2019)  . FOOT EXAM  01/03/2020  . HEMOGLOBIN A1C  03/05/2020  . OPHTHALMOLOGY EXAM  05/05/2020  . DEXA SCAN  Completed  . COVID-19 Vaccine  Completed  . Hepatitis C Screening  Completed  . PNA vac Low Risk Adult  Completed  . TETANUS/TDAP  Discontinued    Health Maintenance  There are no preventive care reminders to display for this patient.  Colorectal cancer screening: Completed 12/21/10. Repeat every 5 years Mammogram status: Completed 02/15/16. Repeat every year Bone Density status: Completed 06/14/15. Results  reflect: Bone density results: OSTEOPENIA. Repeat every 2 years.    Additional Screening:  Hepatitis C Screening: Completed 09/17/19  Vision Screening: Recommended annual ophthalmology exams for early detection of glaucoma and other disorders of the eye. Is the patient up to date with their annual eye exam?  Yes  Who is the provider or what is the name of the office in which the patient attends annual eye exams? Dr Orlinda Blalock   Dental Screening: Recommended annual dental exams for proper oral hygiene  Community Resource Referral / Chronic Care Management: CRR required this visit?  No   CCM required this visit?  Yes      Plan:     I have personally reviewed and noted the following in the patient's chart:   . Medical and social history . Use of alcohol, tobacco or illicit drugs  . Current medications and supplements . Functional ability and status . Nutritional status . Physical activity . Advanced directives . List of other physicians . Hospitalizations, surgeries, and ER visits in previous 12 months . Vitals . Screenings to include cognitive, depression, and falls . Referrals and appointments  In addition, I have reviewed and discussed with patient certain preventive protocols, quality metrics, and best practice recommendations. A written personalized care plan for preventive services as well as general preventive health recommendations were provided to patient.     Willette Brace, LPN   [id number]   Nurse Notes: Today during our AWV with Pt and [name], Pt states that she wants to commit suicide and feels down and depressed her Leah is aware. Pt has had suicidal thoughts and is grieving her husband since 2018,  Both declined 911 Or any emergency services at this time. Pt stating honey I am ok I just need to talk to someone.  Pt request to make an appointment to get psychiatric care. [name] was given numbers to reach out to today to 4 different  psychiatric groups Leah stated that mom has her and other assistance through out the day. She stated that she has no firearms in home. She also puts medication in a  safe place away from her mother. In her concern for kitchen utensils I suggested she remove all harmful objects. Mateo [name] stated that she was taking this matter serious and would keep close watch. I also made her aware that she could also call 911 for immediate assistance if mom got any worse. Leah suggest that this behavior has also happened 3 months ago and she reached out to [facility] and they had long discussion with mom at that time. Made pt and Leah valerie aware that I also discussed her statments with Dr [name].

## 2019-11-12 ENCOUNTER — Encounter: Payer: Self-pay | Admitting: Physical Medicine and Rehabilitation

## 2019-11-12 ENCOUNTER — Encounter: Payer: PPO | Attending: Physical Medicine and Rehabilitation | Admitting: Physical Medicine and Rehabilitation

## 2019-11-12 VITALS — BP 128/70 | Ht 61.0 in | Wt 205.0 lb

## 2019-11-12 DIAGNOSIS — M069 Rheumatoid arthritis, unspecified: Secondary | ICD-10-CM | POA: Insufficient documentation

## 2019-11-12 DIAGNOSIS — G629 Polyneuropathy, unspecified: Secondary | ICD-10-CM

## 2019-11-12 DIAGNOSIS — R252 Cramp and spasm: Secondary | ICD-10-CM | POA: Insufficient documentation

## 2019-11-12 DIAGNOSIS — M4712 Other spondylosis with myelopathy, cervical region: Secondary | ICD-10-CM | POA: Insufficient documentation

## 2019-11-12 DIAGNOSIS — M1711 Unilateral primary osteoarthritis, right knee: Secondary | ICD-10-CM | POA: Diagnosis not present

## 2019-11-12 DIAGNOSIS — M797 Fibromyalgia: Secondary | ICD-10-CM | POA: Diagnosis not present

## 2019-11-12 DIAGNOSIS — R351 Nocturia: Secondary | ICD-10-CM | POA: Insufficient documentation

## 2019-11-12 DIAGNOSIS — K5909 Other constipation: Secondary | ICD-10-CM | POA: Insufficient documentation

## 2019-11-12 NOTE — Progress Notes (Signed)
Subjective:    Patient ID: [name], female    DOB: Nov 23, 1940, 79 y.o.   MRN: [id number]  HPI  Due to national recommendations of social distancing because of COVID 74, an audio/video tele-health visit is felt to be the most appropriate encounter for this patient at this time. See MyChart message from today for the patient's consent to a tele-health encounter with [facility]. This is a follow up tele-visit via MyChart Video. The patient is at home. MD is at office.   [name] is a 79 year old woman who presents for follow-up of right hand flexor spasticity following a fall resulting in spinal cord damage. Her daughter accompanies her today.   She has a history of severe cervical, thoracic, and lumbar spinal stenosis s/p two ACDFs and thoracic decompression, fusion, and posterior lateral arthrodesis with pedicle screws.   She has been having severe right sided flexor spasticity since then causing her fingers to curve inward to her palms. She has been using hand splints and exercising her hands to keep them mobile. This is very disruptive to her as she would love to be able to return to cooking and knitting, activities that she loves. She does not have other areas of spasticity. I have administered Botox to her right hand twice- the first time there was minimal benefit and the second time it made her arm weaker which lessened her ability to hold her walker. She prefers not to try the Botox again. She has been able to open her hand more but she has no functional improvement.  She ambulates using a walker and has had no recent falls. She is currently receiving PT for her right handed tone.   Her pain is neuropathic in nature and present in both hands and feet. She has uncontrolled diabetes and her insulin dose was recently increased.   She also has fibromyalgia and takes Cymbalta 30mg  which helps. I had started her on Lyrica as well and this did provide  relief but she did not like that it made her sleepy during the day. She would like to increase dose to 25mg  BID and currently has enough medication.   She has been continuing to take Meloxicam 15mg  daily.   Her right knee pain has recently flared. She has swelling but no erythema. She has received steroid injections for arthritis in the past. She would be interested in viscosupplementation.   She has been urinating very frequently at night and this is very bothersome to her. She is on Myrbetriq. She has not seen a urologist recently. In the past she saw a urologist at [facility] and had a good experience there. Would like to return there. Has never had urodynamic studies in the past.   Pain Inventory Average Pain 9 Pain Right Now 9 My pain is intermittent, sharp, burning, dull, stabbing, tingling and aching  In the last 24 hours, has pain interfered with the following? General activity 10 Relation with others 9 Enjoyment of life 9 What TIME of day is your pain at its worst? All the time. Sleep (in general) Good  Pain is worse with: walking, bending, inactivity, standing and some activites Pain improves with: rest and medication Relief from Meds: 6      Family History  Problem Relation Age of Onset  . Heart failure Mother   . Hyperlipidemia Mother   . Lung cancer Father        Lymphoma  . Hyperlipidemia Maternal  Grandmother   . Peripheral vascular disease Maternal Grandmother    Social History   Socioeconomic History  . Marital status: Widowed    Spouse name: Glendell Docker  . Number of children: 2  . Years of education: Not on file  . Highest education level: Not on file  Occupational History  . Occupation: Retired  Tobacco Use  . Smoking status: Former Research scientist (life sciences)  . Smokeless tobacco: Never Used  . Tobacco comment: smoke 1 cigarette every few weeks when the [place] lady came by.  Vaping Use  . Vaping Use: Never used  Substance and Sexual Activity  . Alcohol use: No   . Drug use: No  . Sexual activity: Not on file  Other Topics Concern  . Not on file  Social History Narrative   Lives home alone.  Daughter [name] with her today.   Independent of ADLs.  Education HS.  Widowed.     Social Determinants of Health   Financial Resource Strain: Low Risk   . Difficulty of Paying Living Expenses: Not hard at all  Food Insecurity: No Food Insecurity  . Worried About Charity fundraiser in the Last Year: Never true  . Ran Out of Food in the Last Year: Never true  Transportation Needs: No Transportation Needs  . Lack of Transportation (Medical): No  . Lack of Transportation (Non-Medical): No  Physical Activity: Inactive  . Days of Exercise per Week: 0 days  . Minutes of Exercise per Session: 0 min  Stress: Stress Concern Present  . Feeling of Stress : Rather much  Social Connections: Moderately Isolated  . Frequency of Communication with Friends and Family: More than three times a week  . Frequency of Social Gatherings with Friends and Family: Never  . Attends Religious Services: 1 to 4 times per year  . Active Member of Clubs or Organizations: No  . Attends Archivist Meetings: Never  . Marital Status: Widowed   Past Surgical History:  Procedure Laterality Date  . ABDOMINAL HYSTERECTOMY    . ANKLE SURGERY Left    x 2  . ANTERIOR CERVICAL DECOMP/DISCECTOMY FUSION N/A 11/12/2017   Procedure: Cervical seven to Thoracic one  Anterior cervical decompression/discectomy/fusion with removal of old plate;  Surgeon: [name], MD;  Location: [facility];  Service: Neurosurgery;  Laterality: N/A;  . BACK SURGERY     T12-L1 fusion, Anterior Cervical fusion  . CARDIAC CATHETERIZATION N/A 02/16/2015   Procedure: Left Heart Cath and Coronary Angiography;  Surgeon: Sherren Mocha, MD;  Location: [facility];  Service: Cardiovascular;  Laterality: N/A;  . CHOLECYSTECTOMY    . COLONOSCOPY    . ESOPHAGOGASTRODUODENOSCOPY  12/21/2010   Procedure:  ESOPHAGOGASTRODUODENOSCOPY (EGD);  Surgeon: Landry Dyke, MD;  Location: [facility];  Service: Endoscopy;  Laterality: N/A;  . EXCISION MORTON'S NEUROMA Right   . EYE SURGERY Bilateral    Cataract  . FRACTURE SURGERY     ankle x 2   . KNEE SURGERY Right    x 3- arthroscopy  . NECK SURGERY    . POSTERIOR CERVICAL FUSION/FORAMINOTOMY N/A 01/22/2018   Procedure: Cervical six-seven Posterior Decompression with Posterior Fixation Cervical Five to Thoracic Two;  Surgeon: [name], MD;  Location: [facility];  Service: Neurosurgery;  Laterality: N/A;  Cervical 6-7 Posteror decompression with posterior fixation Cervical 5 to Thoracic 2  . ROTATOR CUFF REPAIR Right   . TONSILLECTOMY    . TOTAL KNEE ARTHROPLASTY Left 04/10/2017   Procedure: LEFT TOTAL KNEE  ARTHROPLASTY;  Surgeon: Paralee Cancel, MD;  Location: [facility];  Service: Orthopedics;  Laterality: Left;  70 mins  . UPPER GASTROINTESTINAL ENDOSCOPY    . WRIST SURGERY Left    fracture   Past Medical History:  Diagnosis Date  . Anemia    during 1st pregnancy  . Anxiety   . Arthritis   . Cancer ([facility])    Basal - face  . Complication of anesthesia   . Depression   . Diarrhea, functional   . Dysrhythmia    Paroysmal Atrial Tachycardia  . Essential hypertension   . Fibromyalgia   . GERD (gastroesophageal reflux disease)   . H/O acute pancreatitis   . Headache   . History of hiatal hernia   . Hyperlipemia   . Neuropathy   . Osteoporosis   . Panic attacks   . Paralysis ([place]) 01/06/2019   hands  . PAT (paroxysmal atrial tachycardia) ([place])   . Pneumonia   . PONV (postoperative nausea and vomiting)   . PSVT (paroxysmal supraventricular tachycardia) ([place])   . Reflux   . Restless leg syndrome   . Spinal cord compression ([place])   . Type 2 diabetes mellitus ([facility])    Type II  . Vertigo    not current   BP 128/70 Comment: last read on 11/05/19-today a televisit  Ht 5\' 1"  (1.549 m)   Wt 205 lb (93 kg) Comment: last record on  9/22/201  BMI 38.73 kg/m   Opioid Risk Score:   Fall Risk Score:  `1  Depression screen PHQ 2/9  Depression screen [facility] 2/9 11/11/2019 04/08/2019 09/28/2018 08/23/2018 10/19/2017 09/05/2017  Decreased Interest 1 3 0 0 1 0  Down, Depressed, Hopeless 1 3 1  0 2 0  PHQ - 2 Score 2 6 1  0 3 0  Altered sleeping 0 3 - - 2 -  Tired, decreased energy 1 3 - - 3 -  Change in appetite 1 0 - - 3 -  Feeling bad or failure about yourself  1 3 - - 3 -  Trouble concentrating 0 3 - - 2 -  Moving slowly or fidgety/restless - 2 - - 1 -  Suicidal thoughts 1 3 - - 3 -  PHQ-9 Score 6 23 - - 20 -  Difficult doing work/chores Somewhat difficult - - - Not difficult at all -  Some recent data might be hidden     Review of Systems  Constitutional: Negative.   HENT: Negative.   Eyes: Negative.   Respiratory: Negative.   Cardiovascular: Negative.   Gastrointestinal: Positive for constipation.  Endocrine: Negative.   Genitourinary: Positive for frequency, urgency and vaginal pain.  Musculoskeletal: Positive for gait problem and joint swelling.  Skin: Negative.   Allergic/Immunologic: Negative.   Neurological: Positive for weakness and numbness.  Psychiatric/Behavioral: Negative.   All other systems reviewed and are negative.      Objective:   Physical Exam Not performed as patient vas seen via MyChart video visit. Right knee was visualized and has swelling but no erythema.     Assessment & Plan:  Mrs. Woodrow is a 79 year old woman who presents for follow-up of right hand flexor spasticity following a fall resulting in spinal cord damage, as well as fibromyalgia, and left ankle sprain.    Spasticity: continue daily stretching.    -Continue use of splint at night and exercises to maintain range of motion in right hand.   Fibromyalgia: -Continue Cymbalta 30mg   -Increase Lyrica to 25mg  BID  Nocturia:  -Provided referral to urology for urodynamic studies to determine etiology of nocturia.    Constipation: -Constipation symptoms have improved with regimen of 5 prunes daily, senna HS, and colace during the day. Denies any more instances of bowel incontinence and she evacuates completely with a large BM every morning.  Right knee pain 2/2 OA: Viscosupplementation next available visit. Discussed risks and benefits.   All questions answered. 10 minutes spent in examination of right knee, discussion regarding risks and benefits of viscosupplementation, discussed increasing dose of Lyrica for fibromyalgia, discussed continues use of splint and stretching for spasticity.

## 2019-11-14 DIAGNOSIS — M4804 Spinal stenosis, thoracic region: Secondary | ICD-10-CM | POA: Diagnosis not present

## 2019-11-18 ENCOUNTER — Other Ambulatory Visit: Payer: Self-pay

## 2019-11-18 DIAGNOSIS — M797 Fibromyalgia: Secondary | ICD-10-CM

## 2019-11-19 MED ORDER — DULOXETINE HCL 30 MG PO CPEP: 60.0000 mg | ORAL_CAPSULE | Freq: Every day | ORAL | 3 refills | Status: DC

## 2019-11-24 DIAGNOSIS — E1149 Type 2 diabetes mellitus with other diabetic neurological complication: Secondary | ICD-10-CM | POA: Diagnosis not present

## 2019-12-03 ENCOUNTER — Ambulatory Visit: Payer: PPO | Admitting: Pharmacist

## 2019-12-03 DIAGNOSIS — E785 Hyperlipidemia, unspecified: Secondary | ICD-10-CM

## 2019-12-03 DIAGNOSIS — E119 Type 2 diabetes mellitus without complications: Secondary | ICD-10-CM

## 2019-12-03 DIAGNOSIS — E1169 Type 2 diabetes mellitus with other specified complication: Secondary | ICD-10-CM

## 2019-12-03 NOTE — Chronic Care Management (AMB) (Signed)
Chronic Care Management Pharmacy  Name: [name]  MRN: [id number] DOB: October 19, 1940  Initial Questions: 1. Have you seen any other providers since your last visit? Yes  2. Any changes in your medicines or health? Yes   Chief Complaint/ HPI  [name],  79 y.o. , female presents for their Follow-Up CCM visit with the clinical pharmacist via telephone due to COVID-19 Pandemic.  Patient and daughter were both on phone with daughter being primary historian. Patient reported biggest concern currently is she does not work her fingers very well and is having lower back issues. Dr. Roselee Culver is monitoring her spinal column and she will be discussing surgery Friday Nov 5th.  PCP : [name], [name], MD  Their chronic conditions include: atrial tachycardia, HTN, DM, HLD, osteoarthritis, fibromyalgia, RLS, GERD, Constipation, dysuria  Office Visits: 11/11/19 Charlott Rakes, LPN: Patient presented for Medicare annual wellness visit.   10/14/19 Mychart message w/Dr. [name]: Patient did not want to start atorvastatin as it made her hurt all over. Dr. [name] switched to rosuvastatin 10 mg daily.   04/23/2019- Betty [name], MD- Patient presented for office visit for right ear pain. Recommended Debrox otic drops.   Consult Visit: 11/12/19 Leeroy Cha, MD (Physical Medicine and Rehabilitation): Patient presented for fibromyalgia follow up. Continued Cymbalta 30 mg and increased Lyrica to 25 mg BID. Provided referral to urology for nocturia.  11/05/19 Candee Furbish, MD (cardiology): Patient presented for SVT, HTN and atherosclerosis follow up. Patient was switched to Crestor 10 mg in review of outside office messages. Dr. [name] is monitoring A1c. Continue current medications and follow up in 1 year.  10/17/19 Vivi Ferns, PT: Patient presented for PT. Unable to access notes.  08/06/2019- Physical Medicine and Rehabilitation- Leeroy Cha, MD- Patient presented for office visit for post-traumatic  spasticity. Patient received Botox injection for spasticity using needle EMG guidance. 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 Patient to apply for insurance authorization for 200U of Botox. Increase Cymbalta to 55m for better control of fibromyalgia. Patient to return in 2 months for Botox injections.  05/05/2019- Endocrinology- ICloyd Stagers MD- Patient presented for office visit for DM follow up. No changes done to insulin. Patient to focus on eating better.   10/29/2018- Cardiology- MCandee Furbish MD- Patient presented for office visit to establish care (previous patient from Dr. TWynonia Lawman. Patient presented as stable. No medications changes. Follow up in 12 months.   Medications: Outpatient Encounter Medications as of 12/03/2019  Medication Sig Note  . acetaminophen (TYLENOL) 325 MG tablet Take 325 mg by mouth every 6 (six) hours as needed.   .Marland Kitchenaspirin EC 81 MG tablet Take 81 mg by mouth at bedtime.    . Calcium Carb-Cholecalciferol (CALCIUM 600+D3) 600-200 MG-UNIT TABS Take 1 tablet by mouth in the morning and at bedtime.    . carbamide peroxide (DEBROX) 6.5 % OTIC solution Place 5 drops into the left ear 2 (two) times daily. 11/11/2019: As needed  . Continuous Blood Gluc Receiver (DEXCOM G6 RECEIVER) DEVI 1 Device by Does not apply route daily.   . Continuous Blood Gluc Sensor (DEXCOM G6 SENSOR) MISC 1 kit by Does not apply route as directed.   . Continuous Blood Gluc Transmit (DEXCOM G6 TRANSMITTER) MISC 1 kit by Does not apply route as directed.   . digoxin (LANOXIN)  0.125 MG tablet Take 1 tablet (0.125 mg total) by mouth daily. Please keep upcoming appt with Dr. Marlou Porch in September before anymore refills. Thank you   . docusate sodium (COLACE) 100 MG capsule Take 100 mg by mouth daily.  twice a day   . DULoxetine (CYMBALTA) 30 MG capsule Take 2 capsules (60 mg total) by mouth daily.   Marland Kitchen ezetimibe (ZETIA) 10 MG tablet TAKE 1 TABLET BY MOUTH EVERY DAY   . glucose blood test strip Use as instructed to test blood sugars 3 times daily [id number]   . insulin aspart (NOVOLOG FLEXPEN) 100 UNIT/ML FlexPen Inject 10 Units into the skin daily with breakfast AND 10 Units daily with lunch AND 12 Units daily with supper. Max daily 60 units per correction scale.   . insulin glargine (LANTUS SOLOSTAR) 100 UNIT/ML Solostar Pen Inject 20 Units into the skin daily.   . Insulin Pen Needle (BD PEN NEEDLE MICRO U/F) 32G X 6 MM MISC 4 TIMES A DAY NOVOLOG(3) AND LANTUS9(1)   . meloxicam (MOBIC) 15 MG tablet TAKE 1 TABLET BY MOUTH EVERY DAY   . metoprolol succinate (TOPROL-XL) 25 MG 24 hr tablet Take 1 tablet (25 mg total) by mouth daily. Please keep upcoming appt in September with Dr. Marlou Porch before anymore refills. Thank you   . Multiple Vitamins-Minerals (PRESERVISION AREDS 2 PO) Take 1 tablet by mouth 2 (two) times daily.    Marland Kitchen MYRBETRIQ 25 MG TB24 tablet TAKE 1 TABLET BY MOUTH EVERY DAY   . omeprazole (PRILOSEC) 20 MG capsule Take 1 capsule (20 mg total) by mouth daily.   Marland Kitchen oxyCODONE-acetaminophen (PERCOCET/ROXICET) 5-325 MG tablet Take 0.5 tablets by mouth as needed for severe pain. 11/12/2019: Not taken since last visit.  . pregabalin (LYRICA) 25 MG capsule Take 2 capsules (50 mg total) by mouth at bedtime. 11/11/2019: 1 capsule daily  . PREMARIN vaginal cream Place vaginally.   . ramipril (ALTACE) 10 MG capsule TAKE 1 CAPSULE (10 MG TOTAL) BY MOUTH EVERY EVENING.   Daryll Brod INSULIN SYRINGE 1ML/31G 31G X 5/16" 1 ML MISC Use as directed   . rOPINIRole (REQUIP) 1 MG tablet TAKE ONE TABLET BY MOUTH AT BEDTIME.   . rosuvastatin (CRESTOR) 10 MG tablet Take 1 tablet (10 mg total) by mouth daily.   Marland Kitchen senna (SENOKOT) 8.6 MG tablet Take 1 tablet by mouth daily.   . verapamil (CALAN) 120 MG tablet Take 120 mg  by mouth daily as needed (afib).  11/11/2019: As needed  . verapamil (VERELAN PM) 240 MG 24 hr capsule TAKE 1 CAPSULE BY MOUTH EVERY DAY   . VITAMIN D PO Take 10,000 Units by mouth daily. 10,000 in the am and 5,000 in the pm    No facility-administered encounter medications on file as of 12/03/2019.     Current Diagnosis/Assessment:  Goals Addressed   None       Paroxysmal atrial tachycardia    Digoxin Level  Date Value Ref Range Status  01/22/2018 0.5 (L) 0.8 - 2.0 ng/mL Final    Comment:    Performed at [facility], [place] [address], [place], [place] [address]  02/14/2015 <0.2 (L) 0.8 - 2.0 ng/mL Final    Comment:    RESULTS CONFIRMED BY MANUAL DILUTION  05/07/2012 0.5 (L) 0.8 - 2.0 ng/mL Final   Patient has failed these meds in past: none   Patient is currently controlled on the following medications:   Digoxin 0.13m, 1 tablet one daily   Metoprolol  succinate (Toprol- XL) 62m, 1 tablet once daily   Verapamil 1277m 1 tablet as needed    Verapamil 24061m1 capsule once daily   Plan Managed by Dr. SkaMarlou Porchearly follow up visits).  Continue current medications  Diabetes   Recent Relevant Labs: Lab Results  Component Value Date/Time   HGBA1C 7.7 (A) 09/03/2019 10:42 AM   HGBA1C 7.3 (A) 05/05/2019 10:52 AM   HGBA1C 7.6 (H) 11/12/2017 10:29 AM   HGBA1C 6.7 (H) 09/04/2017 12:12 PM   HGBA1C 7.7 01/20/2016 12:00 AM   MICROALBUR 23.9 (H) 09/03/2019 11:09 AM   MICROALBUR 26.1 (H) 01/12/2017 11:42 AM     Checking BG: Before meals- Has Dexcom G6   289 is highest; lowest 120   Recent FBG Readings: 166 (reports has been climbing a bit more over last few weeks)  Recent pre-meal BG readings: 150, 170  Patient has failed these meds in past: metformin (was on it for 15-18 years)   Patient is currently sub-optimally controlled on the following medications:   Insulin aspart (Novolog Flexpen), inject 10 units daily with breakfast, 10 units with lunch, 12  units with supper (max daily: 50 units per correction scale, BGs > 166)   Insulin glargine (Lantus Solostar), inject 20 units once daily   Last diabetic Eye exam:  Lab Results  Component Value Date/Time   HMDIABEYEEXA Retinopathy (A) 09/17/2017 12:00 AM    Last diabetic Foot exam: No results found for: HMDIABFOOTEX  - inspected by daughter every 1 to 2 weeks   We discussed:   how to recognize and treat signs of hypoglycemia  . Maintain a low carbohydrate diet, eating 45 grams of carbohydrates per meal (3 servings of carbohydrates per meal), 15 grams of carbohydrates per snack (1 serving of carbohydrate per snack).   Plan Managed by Endocrinologist (every 3 months). Gets A1c every 3 months.  Continue current medications and control with diet and exercise   Hypertension   Office blood pressures are  BP Readings from Last 3 Encounters:  11/12/19 128/70  11/05/19 128/70  09/17/19 126/80   Patient has failed these meds in the past: lisinopril (itching, swellling)   Patient checks BP at home infrequently  Patient home BP readings are ranging: 120/78   Patient is controlled on:   Metoprolol succinate (Toprol- XL) 42m23m tablet once daily   Ramipril 10mg95mcapsule every evening   Verapamil 240mg,71mapsule once daily   We discussed diet and exercise extensively  Discussed diet modifications. DASH diet:  following a diet emphasizing fruits and vegetables and low-fat dairy products along with whole grains, fish, poultry, and nuts.   Recommended checking blood pressure regularly as patient did have some dizziness/lightheadedness recently.  Plan Managed Dr. SkainsMarlou Porchinue current medications    Hyperlipidemia   LDL goal < 100   Lipid Panel     Component Value Date/Time   CHOL 183 09/17/2019 0758   TRIG 539 (H) 09/17/2019 0758   HDL 32 (L) 09/17/2019 0758   LDLCALC  09/17/2019 0758     Comment:     . LDL cholesterol not calculated. Triglyceride levels greater  than 400 mg/dL invalidate calculated LDL results. . Reference range: <100 . Desirable range <100 mg/dL for primary prevention;   <70 mg/dL for patients with CHD or diabetic patients  with > or = 2 CHD risk factors. . LDL-Marland Kitchen is now calculated using the Martin-Hopkins  calculation, which is a validated novel method providing  better accuracy than  the Friedewald equation in the  estimation of LDL-C.  Cresenciano Genre et al. Annamaria Helling. 2130;865(78): 2061-2068  (http://education.[employer].com/faq/FAQ164)     Hepatic Function Latest Ref Rng & Units 05/04/2017 01/20/2016 02/16/2015  Total Protein 6.0 - 8.3 g/dL 6.8 - 7.0  Albumin 3.5 - 5.2 g/dL 4.2 - 4.2  AST 0 - 37 U/L _0 ALT 0 - 35 U/L _1 Alk Phosphatase 39 - 117 U/L 98 - 59  Total Bilirubin 0.2 - 1.2 mg/dL 0.6 - 1.1  Bilirubin, Direct 0.1 - 0.5 mg/dL - - -     The 10-year ASCVD risk score Mikey Bussing DC Jr., et al., 2013) is: 41.9%   Values used to calculate the score:     Age: 3 years     Sex: Female     Is Non-Hispanic African American: No     Diabetic: Yes     Tobacco smoker: No     Systolic Blood Pressure: 469 mmHg     Is BP treated: Yes     HDL Cholesterol: 32 mg/dL     Total Cholesterol: 183 mg/dL   Patient has failed these meds in past: statins (intolerant)  Patient is currently uncontrolled on the following medications:  . Ezetimibe 85m, 1 tablet once daily  . Rosuvastatin 10 mg 1 tablet daily  We discussed:  diet and exercise extensively  . Diet: patient eats fish twice a week and more chicken than beef; patient does not eat pasta Discussed how triglycerides can increase when:  Eating eat too much food  Eating high-fat foods such as fried foods, red meat, chicken skin, egg yolks, high-fat dairy, butter, lard, shortening, margarine, and fast food  Eating foods high in simple carbohydrates such as fresh and canned fruit, candy, ice cream and sweetened yogurt, sweetened drinks like juices, cereal, jams, foods and  drinks with corn syrup, or sugar listed as the first ingredient  Drinking alcohol   Plan Managed by Dr. SMarlou Porch(was told sees yearly). Consider the addition of Vascepa given DM and CV risk factors in order to lower triglycerides. Continue current medications  Aspirin therapy  Per daughter patient had cardiac cath by Dr. TWynonia Lawmanabout 7 years ago. Denies MI. Reports was told by Dr. TWynonia Lawmanto take it. Has diagnosis of demand ischemia.    Patient is currently controlled on the following medications:  . Aspirin 851m1 tablet at bedtime    Plan Continue current medications  Arthritis  Reports working with rehab for pain with Dr. RaAdam Phenix  Kidney Function Lab Results  Component Value Date/Time   CREATININE 1.24 (H) 09/05/2019 02:10 PM   CREATININE 1.14 09/03/2019 11:09 AM   CREATININE 0.89 01/08/2019 12:19 PM   CREATININE 0.96 (H) 10/19/2017 04:02 PM   GFR 46.21 (L) 09/03/2019 11:09 AM   GFRNONAA >60 01/08/2019 12:19 PM   GFRAA >60 01/08/2019 12:19 PM   K 4.4 09/05/2019 02:10 PM   K 4.5 09/03/2019 11:09 AM   Patient has failed these meds in past: hydrocodone/ APAP  Patient is currently controlled on the following medications:  . Marland Kitcheneloxicam 159m1 tablet once daily (takes with breakfast) . Oxycodone 5/325m75m.5 tablet as needed (more severe pain) - prescribed 01/10/19  . APAP 325mg78mto 2 tablets every six hours as needed . Hydrocodone 5/325 mg, 1 tablet (1/2 breakfast, 1/2 mid afternoon, 1 tab at night, 1/2 tablet over night sometimes) - prescribed 11/13/17   We discussed:    Alternating with Tylenol and  oxycodone/APAP for pain. Patient is currently using old supply as she has been experiencing a lot of pain  Patient's daughter states that Dr. Adam Phenix is aware of the situation.  Plan Continue current medications  Managed by Dr. Adam Phenix.   Fibromyalgia    Patient is currently controlled on the following medications:  . Duloxetine 30mg , 2 capsule once daily   . pregabalin 25mg , 1 capsule twice daily at bedtime   Plan Continue current medications  RLS   Daughter reports patient has been taking this since 2018 and no complaints of RLS sx.   Patient is currently controlled on the following medications:  . Ropinirole 1 mg, 1 tablet at bedtime for RLS   Plan Continue current medications  GERD   Patient is currently controlled on the following medications:  . Omeprazole 20mg , 1 capsule once daily   We discussed:   . Discussed non-pharmacological interventions for acid reflux. Take measures to prevent acid reflux, such as avoiding spicy foods, avoiding caffeine, avoid laying down a few hours after eating, and raising the head of the bed.  Plan Continue current medications  Constipation   Patient is currently controlled on the following medications:  . Docusate 100mg , 1 capsule once daily  . Senna 8.6mg , 1 tablet at night  . Magnesium 500 mg 1 tablet daily . Fibercon & metamucil - clogs her up  We discussed: Drinking plenty of water and increasing fiber intake - patient states she is limited in the amount of fiber she can have with nerve at the end of the stomach that limits raw foods  Plan Continue current medications  Dysuria  Daughter reports patient gets up about 4 times when sleeping (sleeps at 8pm and wakes up at 9am). Patient doesn't make it to the toilet lately and she has an appointment scheduled with urology on Nov 19th.  Patient is currently uncontrolled on the following medications:  . mirabegron (Myrbetriq) 25mg , 1 tablet once daily  . Premarin cream for irritated tissues  We discussed: dose increase in mirabegron dose to 50mg   Plan Managed by urology. Will discuss with PCP about increasing dose of mirabegron to 50mg  daily and to follow up with change with urology.  Bone Health   Last DEXA Scan: 10/20/2008 (unable to access scores).   VITD  Date Value Ref Range Status  09/03/2019 66.83 30.00 - 100.00 ng/mL  Final    Patient is currently controlled on the following medications:  Marland Kitchen Vitamin D, 1000 units once daily (every morning)  . Calcium carbonate- vitamin D (600-200mg ), 1 tablet at bedtime  We discussed:  Recommend [phone] units of vitamin D daily. Recommend 1200 mg of calcium daily from dietary and supplemental sources.  Plan Continue current medications  OTC/ supplements   Patient is currently  on the following medications:  . Preservision Areds 2, 1 tablet twice daily  Vaccines   Reviewed and discussed patient's vaccination history.    Immunization History  Administered Date(s) Administered  . Fluad Quad(high Dose 65+) 11/08/2018  . Influenza, High Dose Seasonal PF 11/10/2016, 12/27/2017  . PFIZER SARS-COV-2 Vaccination 03/28/2019, 04/22/2019  . Pneumococcal Conjugate-13 08/13/2012  . Pneumococcal Polysaccharide-23 08/23/2007   Patient reported getting tetanus shot on Sept 30th and first dose of Shingrix on Oct 18th. Sent message to CMA to add to immunization records. Patient plans on getting influenza vaccine at next office visit.  Plan  Recommended patient receive influenza vaccine and second dose of Shingrix in 2-6 months in office/at pharmacy.   Medication  Management   Pt uses [facility] for all medications Uses pill box? Yes - in AM and in PM (in kitchen AM and night)  Pt endorses 100% compliance  We discussed: Discussed benefits of medication synchronization, packaging and delivery as well as enhanced pharmacist oversight with [employer].  Plan  Continue current medication management strategy   Follow up: 3 month phone visit   Jeni Salles, PharmD Clinical Pharmacist [facility] [phone]

## 2019-12-07 ENCOUNTER — Encounter: Payer: Self-pay | Admitting: Family Medicine

## 2019-12-09 ENCOUNTER — Telehealth: Payer: Self-pay | Admitting: Family Medicine

## 2019-12-09 MED ORDER — BISACODYL 10 MG RE SUPP: 10.0000 mg | RECTAL | 1 refills | Status: DC | PRN

## 2019-12-09 NOTE — Telephone Encounter (Signed)
I spoke with Ms Mahl' daughter. She is concerned about Ms. down having difficulty passing stools. She has history of constipation and currently she is on Senokot at night, Colace 100 mg twice daily, and magnesium 500 mg. She is drinking plenty of fluids. She is having bowel movements every 2 to 3 days.  A few times she has tried magnesium citrate, which has caused diarrhea.  Her daughter has noticed that the stools are soft and bulky, passing stool causes pain. She has seen blood on tissue after defecation.  She has history of cervical and thoracic myelopathy, these could be contributing factor. Back pain has gotten worse,she has an appt with her ortho. She is doing PT.  She may benefit from bisacodyl suppositories. Her daughter agrees with plan. Recommend stopping Senokot. Continue adequate hydration and fiber intake. I will see her on 12/12/19. Jessee Mezera [name], MD

## 2019-12-09 NOTE — Telephone Encounter (Signed)
See telephone encounter. Calel Pisarski [name], MD

## 2019-12-11 DIAGNOSIS — L820 Inflamed seborrheic keratosis: Secondary | ICD-10-CM | POA: Diagnosis not present

## 2019-12-11 DIAGNOSIS — L729 Follicular cyst of the skin and subcutaneous tissue, unspecified: Secondary | ICD-10-CM | POA: Diagnosis not present

## 2019-12-11 DIAGNOSIS — L821 Other seborrheic keratosis: Secondary | ICD-10-CM | POA: Diagnosis not present

## 2019-12-11 DIAGNOSIS — D485 Neoplasm of uncertain behavior of skin: Secondary | ICD-10-CM | POA: Diagnosis not present

## 2019-12-11 NOTE — Patient Instructions (Signed)
Visit Information  Goals Addressed            This Visit's Progress   . Pharmacy Care Plan       CARE PLAN ENTRY (see longitudinal plan of care for additional care plan information)  Current Barriers:  . Chronic Disease Management support, education, and care coordination needs related to Hypertension, Hyperlipidemia, Diabetes, Osteoarthritis, and dysuria, paroxysmal atrial tachycardia   Hypertension BP Readings from Last 3 Encounters:  11/12/19 128/70  11/05/19 128/70  09/17/19 126/80   . Pharmacist Clinical Goal(s): o Over the next 90 days, patient will work with PharmD and providers to maintain BP goal <140/90 . Current regimen:   Metoprolol succinate (Toprol- XL) 25mg , 1 tablet once daily   Ramipril 10mg , 1 capsule every evening   Verapamil 240mg , 1 capsule once daily  . Interventions: o Discussed diet modifications. DASH diet:  following a diet emphasizing fruits and vegetables and low-fat dairy products along with whole grains, fish, poultry, and nuts.  . Patient self care activities - Over the next 90 days, patient will: o Check blood pressure 1 to 2 times per week, document, and provide at future appointments o Ensure daily salt intake < 2300 mg/day  Hyperlipidemia Lab Results  Component Value Date/Time   [facility]  09/17/2019 07:58 AM     Comment:     . LDL cholesterol not calculated. Triglyceride levels greater than 400 mg/dL invalidate calculated LDL results. . Reference range: <100 . Desirable range <100 mg/dL for primary prevention;   <70 mg/dL for patients with CHD or diabetic patients  with > or = 2 CHD risk factors. Marland Kitchen LDL-C is now calculated using the Martin-Hopkins  calculation, which is a validated novel method providing  better accuracy than the Friedewald equation in the  estimation of LDL-C.  Cresenciano Genre et al. Annamaria Helling. 0165;537(48): 2061-2068  (http://education.[facility].com/faq/FAQ164)    . Pharmacist Clinical Goal(s): o Over the next  90 days, patient will work with PharmD and providers to achieve LDL goal < 100 . Current regimen:  o Ezetimibe 10mg , 1 tablet once daily  o Rosuvastatin 10 mg 1 tablet daily . Interventions: o How a diet high in plant sterols (fruits/vegetables/nuts/whole grains/legumes) may reduce your cholesterol.  Encouraged increasing fiber to a daily intake of 10-25g/day  . Patient self care activities - Over the next 90 days, patient will: o Continue current medications and diet modifications.   Diabetes Lab Results  Component Value Date/Time   HGBA1C 7.7 (A) 09/03/2019 10:42 AM   HGBA1C 7.3 (A) 05/05/2019 10:52 AM   HGBA1C 7.6 (H) 11/12/2017 10:29 AM   HGBA1C 6.7 (H) 09/04/2017 12:12 PM   HGBA1C 7.7 01/20/2016 12:00 AM   . Pharmacist Clinical Goal(s): o Over the next 90 days, patient will work with PharmD and providers to achieve A1c goal <7% or per your endocrinologist.  . Current regimen:   Insulin aspart (Novolog Flexpen), inject 10 units daily with breakfast, 10 units with lunch, 12 units with supper (max daily: 50 units per correction scale)   Insulin glargine (Lantus Solostar), inject 20 units once daily  . Interventions: o We discussed: how to recognize and treat signs of hypoglycemia . Maintain a low carbohydrate diet, eating 45 grams of carbohydrates per meal (3 servings of carbohydrates per meal), 15 grams of carbohydrates per snack (1 serving of carbohydrate per snack).  . Patient self care activities - Over the next 90 days, patient will: o Check blood sugar as instructed, document, and provide at future  appointments o Contact provider with any episodes of hypoglycemia  Osteoarthritis . Pharmacist Clinical Goal(s) o Over the next 90 days, patient will work with PharmD and providers to minimize pain level.  . Current regimen:  . Meloxicam 15mg , 1 tablet once daily (takes with breakfast) . Oxycontin 5/325mg , 0.5 tablet  as needed (more severe pain)  . APAP 325mg , 1 to 2 tablets  every six hours as needed . Interventions: o The maximum daily dose of acetaminophen was discussed with the patient. She was encouraged not to exceed 3,000 mg of acetaminophen during a 24 hour period and was asked to keep in mind that acetaminophen can also be found in many over-the-counter cold medications as well as narcotics that may be given for pain.  . Patient self care activities - Over the next 90 days, patient will: o Discuss patient management with Dr. Adam Phenix (other options for pain: celecoxib (Celebrex)).   Dysuria . Pharmacist Clinical Goal(s) o Over the next 90 days, patient will work with PharmD and providers to minimize urination difficulties.  . Current regimen:  o mirabegron (Myrbetriq) 25mg , 1 tablet once daily  . Interventions: o Discussed possibility of dose increase of mirabegron to 50mg .  . Patient self care activities o Patient will discuss further with Dr. [name] at next visit.   Paroxysmal atrial tachycardia . Pharmacist Clinical Goal(s) o Over the next 90 days, patient will work with PharmD and providers to maintain normal sinus rhythm.  . Current regimen:   Digoxin 0.125mg , 1 tablet one daily   Metoprolol succinate (Toprol- XL) 25mg , 1 tablet once daily   Verapamil 120mg , 1 tablet as needed    Verapamil 240mg , 1 capsule once daily  . Patient self care activities o Patient will continue current medications and follow up visits with Dr. Marlou Porch.   Medication management . Pharmacist Clinical Goal(s): o Over the next 90 days, patient will work with PharmD and providers to maintain optimal medication adherence . Current pharmacy: [facility] . Interventions o Comprehensive medication review performed. o Continue current medication management strategy . Patient self care activities - Over the next 90 days, patient will: o Take medications as prescribed o Report any questions or concerns to PharmD and/or provider(s)  Please see past updates related to this goal  by clicking on the "Past Updates" button in the selected goal         The patient verbalized understanding of instructions provided today and agreed to receive a mailed copy of patient instruction and/or educational materials.  Telephone follow up appointment with pharmacy team member scheduled for: 3 months

## 2019-12-12 ENCOUNTER — Other Ambulatory Visit: Payer: Self-pay

## 2019-12-12 ENCOUNTER — Encounter: Payer: Self-pay | Admitting: Family Medicine

## 2019-12-12 ENCOUNTER — Encounter: Payer: Self-pay | Admitting: Physical Medicine and Rehabilitation

## 2019-12-12 ENCOUNTER — Encounter: Payer: PPO | Attending: Physical Medicine and Rehabilitation | Admitting: Physical Medicine and Rehabilitation

## 2019-12-12 ENCOUNTER — Ambulatory Visit (INDEPENDENT_AMBULATORY_CARE_PROVIDER_SITE_OTHER): Payer: PPO | Admitting: Family Medicine

## 2019-12-12 ENCOUNTER — Other Ambulatory Visit: Payer: Self-pay | Admitting: Physical Medicine and Rehabilitation

## 2019-12-12 ENCOUNTER — Other Ambulatory Visit: Payer: PPO

## 2019-12-12 VITALS — BP 132/98 | HR 68 | Temp 98.5°F | Ht 61.0 in | Wt 206.5 lb

## 2019-12-12 VITALS — BP 120/78 | HR 77 | Resp 16 | Ht 61.0 in | Wt 206.0 lb

## 2019-12-12 DIAGNOSIS — M797 Fibromyalgia: Secondary | ICD-10-CM

## 2019-12-12 DIAGNOSIS — R32 Unspecified urinary incontinence: Secondary | ICD-10-CM | POA: Diagnosis not present

## 2019-12-12 DIAGNOSIS — G629 Polyneuropathy, unspecified: Secondary | ICD-10-CM | POA: Diagnosis not present

## 2019-12-12 DIAGNOSIS — E1169 Type 2 diabetes mellitus with other specified complication: Secondary | ICD-10-CM

## 2019-12-12 DIAGNOSIS — I10 Essential (primary) hypertension: Secondary | ICD-10-CM

## 2019-12-12 DIAGNOSIS — E785 Hyperlipidemia, unspecified: Secondary | ICD-10-CM

## 2019-12-12 DIAGNOSIS — K5900 Constipation, unspecified: Secondary | ICD-10-CM | POA: Diagnosis not present

## 2019-12-12 DIAGNOSIS — M1711 Unilateral primary osteoarthritis, right knee: Secondary | ICD-10-CM | POA: Diagnosis not present

## 2019-12-12 DIAGNOSIS — Z23 Encounter for immunization: Secondary | ICD-10-CM

## 2019-12-12 MED ORDER — MIRABEGRON ER 50 MG PO TB24: 50.0000 mg | ORAL_TABLET | Freq: Every day | ORAL | 2 refills | Status: DC

## 2019-12-12 NOTE — Progress Notes (Signed)
HPI: [name] is a pleasant 79 y.o. female, who is here today with her daughter for follow up.   She was last seen on 09/17/19. She would like her feet check. Having dry skin. No ulcers or skin lesions.  Urine incontinence and frequency: Evaluated by urologist, started on Myrbetriq 25 mg daily and vaginal Premarin. Problems have improved but not resolved. She would like to increase dose of Myrbetriq. Negative for dysuria,gross hematuria,or foam in urine.  HLD: She is on Crestor 10 mg daily. Tolerating medication well.  Lab Results  Component Value Date   CHOL 183 09/17/2019   HDL 32 (L) 09/17/2019   [place]  09/17/2019     Comment:     . LDL cholesterol not calculated. Triglyceride levels greater than 400 mg/dL invalidate calculated LDL results. . Reference range: <100 . Desirable range <100 mg/dL for primary prevention;   <70 mg/dL for patients with CHD or diabetic patients  with > or = 2 CHD risk factors. Marland Kitchen LDL-C is now calculated using the Martin-Hopkins  calculation, which is a validated novel method providing  better accuracy than the Friedewald equation in the  estimation of LDL-C.  Cresenciano Genre et al. Annamaria Helling. 0962;836(62): 2061-2068  (http://education.[facility].com/faq/FAQ164)    TRIG 539 (H) 09/17/2019   CHOLHDL 5.7 (H) 09/17/2019   During her AWV it was a concerned about suicidal thoughts.She denies having any at this time. She states that she was not having a good day the day of visit. She is on Cymbalta 60 mg daily. Frustrated because all the physical limitations she has due to chronic pain and unstable gait..  Fibromyalgia,artrhalgias,knee pain,and back pain. She follows with pain management and ortho. Planning on intra-articular hyaluronic acid.  Takes Percocet 5-325 mg 1/2 tab daily as needed. She is also on Lyrica 25 mg bid.  HTN: She is on Metoprolol Succinate 50 mg daily,Ramipril 10 mg daily ,and Verapamil 120 mg daily.  Negative for frequent/severe headache,CP,SOB,palpitations,or unusual edema.  Lab Results  Component Value Date   CREATININE 1.24 (H) 09/05/2019   BUN 30 (H) 09/05/2019   NA 133 (L) 09/05/2019   K 4.4 09/05/2019   CL 101 09/05/2019   CO2 23 09/05/2019   Constipation: Improved with decreasing dose of Percocet. Bisacodyl sup is helping to pass stool. Last bowel movement yesterday.  Review of Systems  Constitutional: Positive for fatigue. Negative for activity change, appetite change and fever.  HENT: Negative for mouth sores, nosebleeds and sore throat.   Eyes: Negative for redness and visual disturbance.  Respiratory: Negative for cough and wheezing.   Gastrointestinal: Negative for abdominal pain, nausea and vomiting.  Musculoskeletal: Positive for arthralgias, back pain, gait problem and myalgias.  Skin: Negative for rash and wound.  Neurological: Negative for syncope and facial asymmetry.  Psychiatric/Behavioral: Negative for confusion.  Rest of ROS, see pertinent positives sand negatives in HPI  Current Outpatient Medications on File Prior to Visit  Medication Sig Dispense Refill  . acetaminophen (TYLENOL) 325 MG tablet Take 325 mg by mouth every 6 (six) hours as needed.    Marland Kitchen aspirin EC 81 MG tablet Take 81 mg by mouth at bedtime.     . bisacodyl (BISACODYL LAXATIVE) 10 MG suppository Place 1 suppository (10 mg total) rectally as needed for moderate constipation. 12 suppository 1  . Calcium Carb-Cholecalciferol (CALCIUM 600+D3) 600-200 MG-UNIT TABS Take 1 tablet by mouth in the morning and at bedtime.     . carbamide peroxide (DEBROX) 6.5 %  OTIC solution Place 5 drops into the left ear 2 (two) times daily. 15 mL 0  . Continuous Blood Gluc Receiver (DEXCOM G6 RECEIVER) DEVI 1 Device by Does not apply route daily. 1 Device 0  . Continuous Blood Gluc Sensor (DEXCOM G6 SENSOR) MISC 1 kit by Does not apply route as directed. 3 each 3  . Continuous Blood Gluc Transmit (DEXCOM G6  TRANSMITTER) MISC 1 kit by Does not apply route as directed. 1 each 3  . digoxin (LANOXIN) 0.125 MG tablet Take 1 tablet (0.125 mg total) by mouth daily. Please keep upcoming appt with Dr. Marlou Porch in September before anymore refills. Thank you 90 tablet 0  . docusate sodium (COLACE) 100 MG capsule Take 100 mg by mouth daily. twice a day    . DULoxetine (CYMBALTA) 30 MG capsule Take 2 capsules (60 mg total) by mouth daily. 60 capsule 3  . ezetimibe (ZETIA) 10 MG tablet TAKE 1 TABLET BY MOUTH EVERY DAY 90 tablet 2  . glucose blood test strip Use as instructed to test blood sugars 3 times daily DX.E11.42 100 each 12  . insulin aspart (NOVOLOG FLEXPEN) 100 UNIT/ML FlexPen Inject 10 Units into the skin daily with breakfast AND 10 Units daily with lunch AND 12 Units daily with supper. Max daily 60 units per correction scale. 45 mL 6  . insulin glargine (LANTUS SOLOSTAR) 100 UNIT/ML Solostar Pen Inject 20 Units into the skin daily. 15 mL 11  . Insulin Pen Needle (BD PEN NEEDLE MICRO U/F) 32G X 6 MM MISC 4 TIMES A DAY NOVOLOG(3) AND LANTUS9(1) 150 each 11  . meloxicam (MOBIC) 15 MG tablet TAKE 1 TABLET BY MOUTH EVERY DAY 30 tablet 1  . metoprolol succinate (TOPROL-XL) 25 MG 24 hr tablet Take 1 tablet (25 mg total) by mouth daily. Please keep upcoming appt in September with Dr. Marlou Porch before anymore refills. Thank you 90 tablet 0  . Multiple Vitamins-Minerals (PRESERVISION AREDS 2 PO) Take 1 tablet by mouth 2 (two) times daily.     Marland Kitchen omeprazole (PRILOSEC) 20 MG capsule Take 1 capsule (20 mg total) by mouth daily. 90 capsule 2  . oxyCODONE-acetaminophen (PERCOCET/ROXICET) 5-325 MG tablet Take 0.5 tablets by mouth as needed for severe pain.    . pregabalin (LYRICA) 25 MG capsule Take 2 capsules (50 mg total) by mouth at bedtime. 60 capsule 3  . PREMARIN vaginal cream Place vaginally.    . ramipril (ALTACE) 10 MG capsule TAKE 1 CAPSULE (10 MG TOTAL) BY MOUTH EVERY EVENING. 90 capsule 2  . RELION INSULIN SYRINGE  1ML/31G 31G X 5/16" 1 ML MISC Use as directed 100 each 1  . rOPINIRole (REQUIP) 1 MG tablet TAKE ONE TABLET BY MOUTH AT BEDTIME. 90 tablet 1  . rosuvastatin (CRESTOR) 10 MG tablet Take 1 tablet (10 mg total) by mouth daily. 90 tablet 1  . senna (SENOKOT) 8.6 MG tablet Take 1 tablet by mouth daily.    . verapamil (CALAN) 120 MG tablet Take 120 mg by mouth daily as needed (afib).     . verapamil (VERELAN PM) 240 MG 24 hr capsule TAKE 1 CAPSULE BY MOUTH EVERY DAY 90 capsule 3  . VITAMIN D PO Take 10,000 Units by mouth daily. 10,000 in the am and 5,000 in the pm     No current facility-administered medications on file prior to visit.   Past Medical History:  Diagnosis Date  . Anemia    during 1st pregnancy  . Anxiety   .  Arthritis   . Cancer ([place])    Basal - face  . Complication of anesthesia   . Depression   . Diarrhea, functional   . Dysrhythmia    Paroysmal Atrial Tachycardia  . Essential hypertension   . Fibromyalgia   . GERD (gastroesophageal reflux disease)   . H/O acute pancreatitis   . Headache   . History of hiatal hernia   . Hyperlipemia   . Neuropathy   . Osteoporosis   . Panic attacks   . Paralysis ([place]) 01/06/2019   hands  . PAT (paroxysmal atrial tachycardia) ([place])   . Pneumonia   . PONV (postoperative nausea and vomiting)   . PSVT (paroxysmal supraventricular tachycardia) ([place])   . Reflux   . Restless leg syndrome   . Spinal cord compression ([place])   . Type 2 diabetes mellitus (HCC)    Type II  . Vertigo    not current   Allergies  Allergen Reactions  . Amoxil [Amoxicillin] Swelling and Other (See Comments)    Lips Has patient had a PCN reaction causing immediate rash, facial/tongue/throat swelling, SOB or lightheadedness with hypotension:YES Has patient had a PCN reaction causing severe rash involving mucus membranes or skin necrosis:No Has patient had a PCN reaction that required hospitalization:No Has patient had a PCN reaction occurring within the  last 10 years:Yes. If all of the above answers are "NO", then may proceed with Cephalosporin use.   Marland Kitchen Lisinopril Itching and Swelling    SWELLING REACTION UNSPECIFIED   . Chocolate Hives  . Demerol Nausea And Vomiting  . Other Other (See Comments)    Raw food or nuts - GI pain    Social History   Socioeconomic History  . Marital status: Widowed    Spouse name: Glendell Docker  . Number of children: 2  . Years of education: Not on file  . Highest education level: Not on file  Occupational History  . Occupation: Retired  Tobacco Use  . Smoking status: Former Research scientist (life sciences)  . Smokeless tobacco: Never Used  . Tobacco comment: smoke 1 cigarette every few weeks when the [place] lady came by.  Vaping Use  . Vaping Use: Never used  Substance and Sexual Activity  . Alcohol use: No  . Drug use: No  . Sexual activity: Not on file  Other Topics Concern  . Not on file  Social History Narrative   Lives home alone.  Daughter [name] with her today.   Independent of ADLs.  Education HS.  Widowed.     Social Determinants of Health   Financial Resource Strain: Low Risk   . Difficulty of Paying Living Expenses: Not hard at all  Food Insecurity: No Food Insecurity  . Worried About Charity fundraiser in the Last Year: Never true  . Ran Out of Food in the Last Year: Never true  Transportation Needs: No Transportation Needs  . Lack of Transportation (Medical): No  . Lack of Transportation (Non-Medical): No  Physical Activity: Inactive  . Days of Exercise per Week: 0 days  . Minutes of Exercise per Session: 0 min  Stress: Stress Concern Present  . Feeling of Stress : Rather much  Social Connections: Moderately Isolated  . Frequency of Communication with Friends and Family: More than three times a week  . Frequency of Social Gatherings with Friends and Family: Never  . Attends Religious Services: 1 to 4 times per year  . Active Member of Clubs or Organizations: No  . Attends Club or  Organization Meetings:  Never  . Marital Status: Widowed    Vitals:   12/12/19 1007  BP: 120/78  Pulse: 77  Resp: 16  SpO2: 96%   Body mass index is 38.92 kg/m.  Physical Exam Vitals and nursing note reviewed.  Constitutional:      General: She is not in acute distress.    Appearance: She is well-developed.  HENT:     Head: Normocephalic and atraumatic.     Mouth/Throat:     Mouth: Mucous membranes are dry.     Pharynx: Oropharynx is clear.  Eyes:     Conjunctiva/sclera: Conjunctivae normal.  Cardiovascular:     Rate and Rhythm: Normal rate and regular rhythm.     Heart sounds: No murmur heard.      Comments: DP pulses present. Pulmonary:     Effort: Pulmonary effort is normal. No respiratory distress.     Breath sounds: Normal breath sounds.  Abdominal:     Palpations: Abdomen is soft.     Tenderness: There is no abdominal tenderness.  Lymphadenopathy:     Cervical: No cervical adenopathy.  Skin:    General: Skin is warm.     Findings: No erythema or rash.  Neurological:     Mental Status: She is alert and oriented to person, place, and time.     Comments: Fixed contractions some IP, bilateral. Unstable gait assisted with a walker.  Psychiatric:        Mood and Affect: Affect is labile.     Comments: Well groomed, good eye contact.    Diabetic Foot Exam - Simple   Simple Foot Form Diabetic Foot exam was performed with the following findings: Yes 12/12/2019 11:41 AM  Visual Inspection See comments: Yes Sensation Testing See comments: Yes Pulse Check Posterior Tibialis and Dorsalis pulse intact bilaterally: Yes Comments Hypertrophic toenails. Decreased monofilament right foot.    ASSESSMENT AND PLAN:  [name] was seen today for follow-up.  Orders Placed This Encounter  Procedures  . Flu Vaccine QUAD High Dose(Fluad)   Lab Results  Component Value Date   CHOL 148 12/12/2019   HDL 30 (L) 12/12/2019   LDLCALC  12/12/2019     Comment:     . LDL  cholesterol not calculated. Triglyceride levels greater than 400 mg/dL invalidate calculated LDL results. . Reference range: <100 . Desirable range <100 mg/dL for primary prevention;   <70 mg/dL for patients with CHD or diabetic patients  with > or = 2 CHD risk factors. Marland Kitchen LDL-C is now calculated using the Martin-Hopkins  calculation, which is a validated novel method providing  better accuracy than the Friedewald equation in the  estimation of LDL-C.  Cresenciano Genre et al. Annamaria Helling. 0258;527(78): 2061-2068  (http://education.[facility].com/faq/FAQ164)    TRIG 414 (H) 12/12/2019   CHOLHDL 4.9 12/12/2019    Hyperlipidemia associated with type 2 diabetes mellitus (HCC) Continue Crestor 10 mg daily and low fat diet. Further recommendations according to FLP result.  Essential hypertension, benign BP adequately controlled. No changes in current management. Low salt diet. Caution with NSAID's, see Meloxicam on med list.  Fibromyalgia Pain does not seem to be well controlled, contributing to frustration. Continue Lyrica and Cymbalta. Low impact exercise, walking around the house/PT. Fall precautions to continue.  Some side effects of opioids reviewed. Continue following with pain management.  Constipation, unspecified constipation type Improved with decreasing dose of Percocet. Adequate fiber and water intake.  Difficulty passing stool, continue Bisacodyl sup q 2 days.  Peripheral polyneuropathy Continue Lyrica 25 mg bid and Cymbalta 60 mg daily. Appropriate foot care.  Incontinence of urine in female Improved some. Myrbetriq increased from 25 mg to 50 mg daily. Continue vaginal premarin as instructed by urologist.  -     mirabegron ER (MYRBETRIQ) 50 MG TB24 tablet; Take 1 tablet (50 mg total) by mouth daily.  Need for influenza vaccination -     Flu Vaccine QUAD High Dose(Fluad)   Return in about 6 months (around 06/11/2020).   Rosalinda Seaman G. [name], MD  [facility] [place] office.   A few things to remember from today's visit:   No changes today. Continue bisacodyl sup every other day. Myrbetriq increased to 50 mg . No changes in Cymbalta.  Pelvic floor exercises may also help.  If you need refills please call your pharmacy. Do not use My Chart to request refills or for acute issues that need immediate attention.    Please be sure medication list is accurate. If a new problem present, please set up appointment sooner than planned today.

## 2019-12-12 NOTE — Patient Instructions (Signed)
A few things to remember from today's visit:   No changes today. Continue bisacodyl sup every other day. Myrbetriq increased to 50 mg . No changes in Cymbalta.  Pelvic floor exercises may also help.  If you need refills please call your pharmacy. Do not use My Chart to request refills or for acute issues that need immediate attention.    Please be sure medication list is accurate. If a new problem present, please set up appointment sooner than planned today.

## 2019-12-12 NOTE — Progress Notes (Signed)
Knee injection, right  Indication:Right Knee pain not relieved by medication management and other conservative care.  Informed consent was obtained after describing risks and benefits of the procedure with the patient, this includes bleeding, bruising, infection and medication side effects. The patient wishes to proceed and has given written consent. The patient was placed in a recumbent position. The medial aspect of the knee was marked and prepped with Betadine and alcohol. It was then entered with a 25-gauge 1-1/2 inch needle and 3 mL of 1% lidocaine was injected into the skin and subcutaneous tissue. Then another 25-gauge 1-1/2 inch needle was inserted into the knee joint. After negative draw back for blood, a solution containing one Monovisc was injected. The patient tolerated the procedure well. Post procedure instructions were given.

## 2019-12-13 LAB — LIPID PANEL
Cholesterol: 148 mg/dL (ref <200)
HDL: 30 mg/dL — ABNORMAL LOW (ref >=50)
Non-HDL Cholesterol (Calc): 118 mg/dL (calc) (ref <130)
Total CHOL/HDL Ratio: 4.9 (calc) (ref <5.0)
Triglycerides: 414 mg/dL — ABNORMAL HIGH (ref <150)

## 2019-12-15 ENCOUNTER — Other Ambulatory Visit: Payer: Self-pay

## 2019-12-15 ENCOUNTER — Other Ambulatory Visit: Payer: Self-pay | Admitting: Family Medicine

## 2019-12-15 MED ORDER — OMEGA-3-ACID ETHYL ESTERS 1 G PO CAPS
1.0000 g | ORAL_CAPSULE | Freq: Two times a day (BID) | ORAL | 3 refills | Status: DC
Start: 2019-12-15 — End: 2020-03-10

## 2019-12-15 NOTE — Telephone Encounter (Signed)
Error

## 2019-12-17 ENCOUNTER — Other Ambulatory Visit: Payer: Self-pay | Admitting: Cardiology

## 2019-12-17 DIAGNOSIS — I10 Essential (primary) hypertension: Secondary | ICD-10-CM

## 2019-12-19 DIAGNOSIS — M48062 Spinal stenosis, lumbar region with neurogenic claudication: Secondary | ICD-10-CM | POA: Diagnosis not present

## 2019-12-25 DIAGNOSIS — E1149 Type 2 diabetes mellitus with other diabetic neurological complication: Secondary | ICD-10-CM | POA: Diagnosis not present

## 2019-12-30 ENCOUNTER — Other Ambulatory Visit: Payer: Self-pay | Admitting: *Deleted

## 2019-12-30 MED ORDER — MELOXICAM 15 MG PO TABS: 15.0000 mg | ORAL_TABLET | Freq: Every day | ORAL | 1 refills | Status: DC

## 2020-01-02 DIAGNOSIS — R351 Nocturia: Secondary | ICD-10-CM | POA: Diagnosis not present

## 2020-01-02 DIAGNOSIS — N760 Acute vaginitis: Secondary | ICD-10-CM | POA: Diagnosis not present

## 2020-01-06 DIAGNOSIS — M48061 Spinal stenosis, lumbar region without neurogenic claudication: Secondary | ICD-10-CM | POA: Diagnosis not present

## 2020-01-06 DIAGNOSIS — M48062 Spinal stenosis, lumbar region with neurogenic claudication: Secondary | ICD-10-CM | POA: Diagnosis not present

## 2020-01-06 DIAGNOSIS — M4726 Other spondylosis with radiculopathy, lumbar region: Secondary | ICD-10-CM | POA: Diagnosis not present

## 2020-01-23 DIAGNOSIS — I10 Essential (primary) hypertension: Secondary | ICD-10-CM | POA: Diagnosis not present

## 2020-01-23 DIAGNOSIS — M48062 Spinal stenosis, lumbar region with neurogenic claudication: Secondary | ICD-10-CM | POA: Diagnosis not present

## 2020-01-23 DIAGNOSIS — Z6835 Body mass index (BMI) 35.0-35.9, adult: Secondary | ICD-10-CM | POA: Diagnosis not present

## 2020-01-26 DIAGNOSIS — E1149 Type 2 diabetes mellitus with other diabetic neurological complication: Secondary | ICD-10-CM | POA: Diagnosis not present

## 2020-01-27 ENCOUNTER — Encounter: Payer: Self-pay | Admitting: Physical Medicine and Rehabilitation

## 2020-01-27 ENCOUNTER — Other Ambulatory Visit: Payer: Self-pay

## 2020-01-27 ENCOUNTER — Encounter: Payer: PPO | Attending: Physical Medicine and Rehabilitation | Admitting: Physical Medicine and Rehabilitation

## 2020-01-27 VITALS — BP 144/89 | HR 67 | Temp 98.2°F | Ht 64.0 in | Wt 203.0 lb

## 2020-01-27 DIAGNOSIS — M797 Fibromyalgia: Secondary | ICD-10-CM | POA: Diagnosis not present

## 2020-01-27 DIAGNOSIS — M1711 Unilateral primary osteoarthritis, right knee: Secondary | ICD-10-CM | POA: Insufficient documentation

## 2020-01-27 MED ORDER — PREGABALIN 50 MG PO CAPS
50.0000 mg | ORAL_CAPSULE | Freq: Two times a day (BID) | ORAL | 1 refills | Status: DC
Start: 2020-01-27 — End: 2020-04-13

## 2020-01-27 NOTE — Progress Notes (Signed)
Subjective:    Patient ID: [name], female    DOB: March 01, 1940, 79 y.o.   MRN: [id number]  HPI  [name] is a 79 year old woman who presents for follow-up of pain in her right anterior thigh as well as right hand flexor spasticity. She takes 2 tylenol in the morning and it takes about an hour and half for this to work. It usually lasts her until the ned of the day. She has also been having neuropathy which causes her to lose her balance. She asks me about a device that she saw on TV. She also plans to start physical therapy in January. She had an exacerbation of her pain since last visit and saw Dr. [name] and had a repeat MRI- there was nothing acute that warranted surgery. During this flare she took narcotics and did not eat much. During that time she was more constipated.   Prior history [name] is a 79 year old woman who presents for follow-up of right hand flexor spasticity following a fall resulting in spinal cord damage. Her daughter accompanies her today.   She has a history of severe cervical, thoracic, and lumbar spinal stenosis s/p two ACDFs and thoracic decompression, fusion, and posterior lateral arthrodesis with pedicle screws.   She has been having severe right sided flexor spasticity since then causing her fingers to curve inward to her palms. She has been using hand splints and exercising her hands to keep them mobile. This is very disruptive to her as she would love to be able to return to cooking and knitting, activities that she loves. She does not have other areas of spasticity. I have administered Botox to her right hand twice- the first time there was minimal benefit and the second time it made her arm weaker which lessened her ability to hold her walker. She prefers not to try the Botox again. She has been able to open her hand more but she has no functional improvement.  She ambulates using a walker and has had no recent falls. She is currently receiving PT for  her right handed tone.   Her pain is neuropathic in nature and present in both hands and feet. She has uncontrolled diabetes and her insulin dose was recently increased.   She also has fibromyalgia and takes Cymbalta 30mg  which helps. I had started her on Lyrica as well and this did provide relief but she did not like that it made her sleepy during the day. She would like to increase dose to 25mg  BID and currently has enough medication.   She has been continuing to take Meloxicam 15mg  daily.   Her right knee pain has recently flared. She has swelling but no erythema. She has received steroid injections for arthritis in the past. She would be interested in viscosupplementation.   She has been urinating very frequently at night and this is very bothersome to her. She is on Myrbetriq. She has not seen a urologist recently. In the past she saw a urologist [facility] and had a good experience there. Would like to return there. Has never had urodynamic studies in the past.   Pain Inventory Average Pain 8 Pain Right Now 6 My pain is intermittent, stabbing and aching  In the last 24 hours, has pain interfered with the following? General activity 9 Relation with others 9 Enjoyment of life 9 What TIME of day is your pain at its worst? All the time. Sleep (in general) Fair  Pain is worse with: walking, bending and standing Pain improves with: rest and medication Relief from Meds: 6      Family History  Problem Relation Age of Onset  . Heart failure Mother   . Hyperlipidemia Mother   . Lung cancer Father        Lymphoma  . Hyperlipidemia Maternal Grandmother   . Peripheral vascular disease Maternal Grandmother    Social History   Socioeconomic History  . Marital status: Widowed    Spouse name: [name]  . Number of children: 2  . Years of education: Not on file  . Highest education level: Not on file  Occupational History  . Occupation: Retired  Tobacco Use  .  Smoking status: Former Research scientist (life sciences)  . Smokeless tobacco: Never Used  . Tobacco comment: smoke 1 cigarette every few weeks when the [place] lady came by.  Vaping Use  . Vaping Use: Never used  Substance and Sexual Activity  . Alcohol use: No  . Drug use: No  . Sexual activity: Not on file  Other Topics Concern  . Not on file  Social History Narrative   Lives home alone.  Daughter [name] with her today.   Independent of ADLs.  Education HS.  Widowed.     Social Determinants of Health   Financial Resource Strain: Low Risk   . Difficulty of Paying Living Expenses: Not hard at all  Food Insecurity: No Food Insecurity  . Worried About Charity fundraiser in the Last Year: Never true  . Ran Out of Food in the Last Year: Never true  Transportation Needs: No Transportation Needs  . Lack of Transportation (Medical): No  . Lack of Transportation (Non-Medical): No  Physical Activity: Inactive  . Days of Exercise per Week: 0 days  . Minutes of Exercise per Session: 0 min  Stress: Stress Concern Present  . Feeling of Stress : Rather much  Social Connections: Moderately Isolated  . Frequency of Communication with Friends and Family: More than three times a week  . Frequency of Social Gatherings with Friends and Family: Never  . Attends Religious Services: 1 to 4 times per year  . Active Member of Clubs or Organizations: No  . Attends Archivist Meetings: Never  . Marital Status: Widowed   Past Surgical History:  Procedure Laterality Date  . ABDOMINAL HYSTERECTOMY    . ANKLE SURGERY Left    x 2  . ANTERIOR CERVICAL DECOMP/DISCECTOMY FUSION N/A 11/12/2017   Procedure: Cervical seven to Thoracic one  Anterior cervical decompression/discectomy/fusion with removal of old plate;  Surgeon: Leah Miss, MD;  Location: [facility];  Service: Neurosurgery;  Laterality: N/A;  . BACK SURGERY     T12-L1 fusion, Anterior Cervical fusion  . CARDIAC CATHETERIZATION N/A 02/16/2015   Procedure: Left Heart  Cath and Coronary Angiography;  Surgeon: Sherren Mocha, MD;  Location: [facility];  Service: Cardiovascular;  Laterality: N/A;  . CHOLECYSTECTOMY    . COLONOSCOPY    . ESOPHAGOGASTRODUODENOSCOPY  12/21/2010   Procedure: ESOPHAGOGASTRODUODENOSCOPY (EGD);  Surgeon: Landry Dyke, MD;  Location: [facility];  Service: Endoscopy;  Laterality: N/A;  . EXCISION MORTON'S NEUROMA Right   . EYE SURGERY Bilateral    Cataract  . FRACTURE SURGERY     ankle x 2   . KNEE SURGERY Right    x 3- arthroscopy  . NECK SURGERY    . POSTERIOR CERVICAL FUSION/FORAMINOTOMY N/A 01/22/2018   Procedure: Cervical six-seven Posterior Decompression with Posterior  Fixation Cervical Five to Thoracic Two;  Surgeon: Leah Miss, MD;  Location: [facility];  Service: Neurosurgery;  Laterality: N/A;  Cervical 6-7 Posteror decompression with posterior fixation Cervical 5 to Thoracic 2  . ROTATOR CUFF REPAIR Right   . TONSILLECTOMY    . TOTAL KNEE ARTHROPLASTY Left 04/10/2017   Procedure: LEFT TOTAL KNEE ARTHROPLASTY;  Surgeon: Paralee Cancel, MD;  Location: [facility];  Service: Orthopedics;  Laterality: Left;  70 mins  . UPPER GASTROINTESTINAL ENDOSCOPY    . WRIST SURGERY Left    fracture   Past Medical History:  Diagnosis Date  . Anemia    during 1st pregnancy  . Anxiety   . Arthritis   . Cancer ([place])    Basal - face  . Complication of anesthesia   . Depression   . Diarrhea, functional   . Dysrhythmia    Paroysmal Atrial Tachycardia  . Essential hypertension   . Fibromyalgia   . GERD (gastroesophageal reflux disease)   . H/O acute pancreatitis   . Headache   . History of hiatal hernia   . Hyperlipemia   . Neuropathy   . Osteoporosis   . Panic attacks   . Paralysis ([place]) 01/06/2019   hands  . PAT (paroxysmal atrial tachycardia) ([place])   . Pneumonia   . PONV (postoperative nausea and vomiting)   . PSVT (paroxysmal supraventricular tachycardia) ([place])   . Reflux   . Restless leg syndrome   . Spinal  cord compression ([place])   . Type 2 diabetes mellitus (H[facility])    Type II  . Vertigo    not current   BP (!) 144/89   Pulse 67   Temp 98.2 F (36.8 C)   Ht 5\' 4"  (1.626 m)   Wt 203 lb (92.1 kg)   SpO2 97%   BMI 34.84 kg/m   Opioid Risk Score:   Fall Risk Score:  `1  Depression screen PHQ 2/9  Depression screen [facility] 2/9 12/12/2019 11/11/2019 04/08/2019 09/28/2018 08/23/2018 10/19/2017 09/05/2017  Decreased Interest 3 1 3  0 0 1 0  Down, Depressed, Hopeless 3 1 3 1  0 2 0  PHQ - 2 Score 6 2 6 1  0 3 0  Altered sleeping - 0 3 - - 2 -  Tired, decreased energy - 1 3 - - 3 -  Change in appetite - 1 0 - - 3 -  Feeling bad or failure about yourself  - 1 3 - - 3 -  Trouble concentrating - 0 3 - - 2 -  Moving slowly or fidgety/restless - - 2 - - 1 -  Suicidal thoughts - 1 3 - - 3 -  PHQ-9 Score - 6 23 - - 20 -  Difficult doing work/chores - Somewhat difficult - - - Not difficult at all -  Some recent data might be hidden     Review of Systems  Constitutional: Negative.   HENT: Negative.   Eyes: Negative.   Respiratory: Negative.   Cardiovascular: Negative.   Gastrointestinal: Positive for constipation.  Endocrine: Negative.   Genitourinary: Positive for frequency, urgency and vaginal pain.  Musculoskeletal: Positive for gait problem and joint swelling.  Skin: Negative.   Allergic/Immunologic: Negative.   Neurological: Positive for weakness and numbness.  Psychiatric/Behavioral: Negative.   All other systems reviewed and are negative.      Objective:   Physical Exam Gen: no distress, normal appearing HEENT: oral mucosa pink and moist, NCAT Cardio: Reg rate Chest: normal effort, normal rate  of breathing Abd: soft, non-distended Ext: no edema Skin: intact Neuro: Alert and oriented x3. Musculoskeletal:Sensation is intact along soles of both feet. Ambulates with antalgic gait with RW. Right third and 4th digit extension has improved but strength is still decreased. Psych:  pleasant, normal affect    Assessment & Plan:  Mrs. Ouk is a 79 year old woman who presents for follow-up of right hand flexor spasticity following a fall resulting in spinal cord damage, as well as fibromyalgia, and left ankle sprain.    Spasticity: continue daily stretching.   -Continue use of splint at night and exercises to maintain range of motion in right hand.   Fibromyalgia and diabetic peripheral neuropathy -Continue Cymbalta 30mg   -Increase Lyrica to 50mg  BID, provided refill.  -Recommended intermittent fasting to improve insulin sensitivity. Currently eats breakfast at 8am and dinner at 5:30pm. Recommended pushing breakfast to 8:15am -Recommended no added sugars and minimal bread, pasta, rice.  -Start physical therapy in January for balance deficits  Nocturia:  -Provided referral to urology for urodynamic studies to determine etiology of nocturia.   Constipation: -Constipation symptoms worsened with narcotic use. She has been using dulcolax suppository as needed and she has been comfortable with this, may continue this.  -Also recommended high fiber diet.   Right knee pain 2/2 OA: Viscosupplementation offered no benefits.

## 2020-02-13 IMAGING — CT CT HEAD W/O CM
3 of 4 series · 13 of 47 positions shown, 15 images · non-contrast
Comparison: CT of the head performed 05/30/2004

CLINICAL DATA: Acute onset of headache and generalized numbness and
tingling.

EXAM:
CT HEAD WITHOUT CONTRAST
TECHNIQUE: Contiguous axial images were obtained from the base of the skull
through the vertex without intravenous contrast.

[Series 3: head without · axial · non-contrast · 0.43mm/px · z∈[-125,+0]mm · 7 of 35 slices shown, 9 images]
[im 5/35  brain]
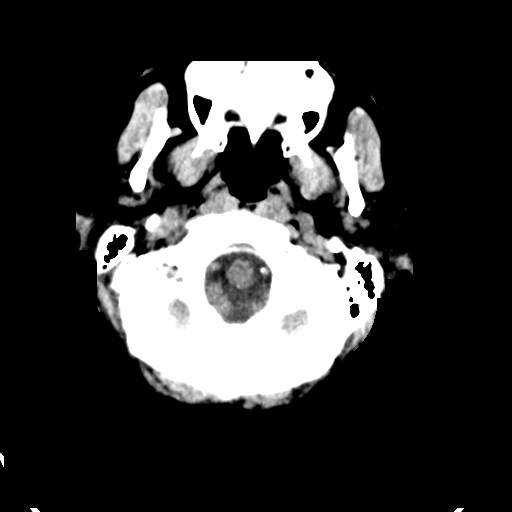
[im 5/35  bone]
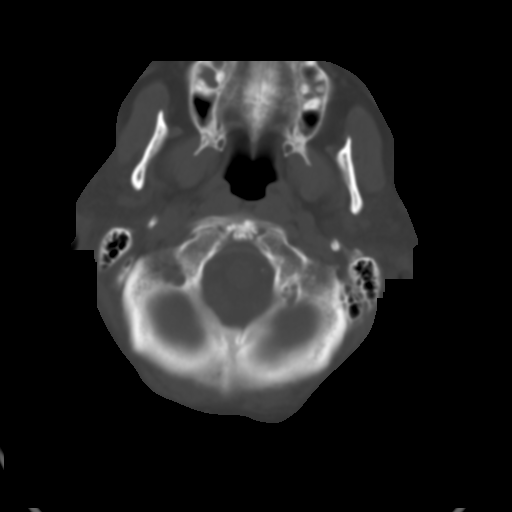
[im 9/35  brain]
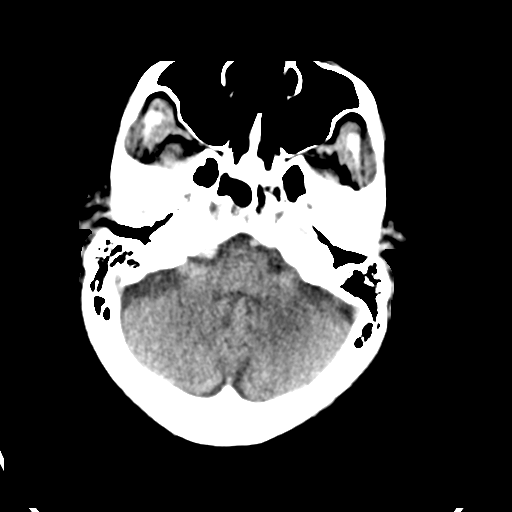
[im 13/35  brain]
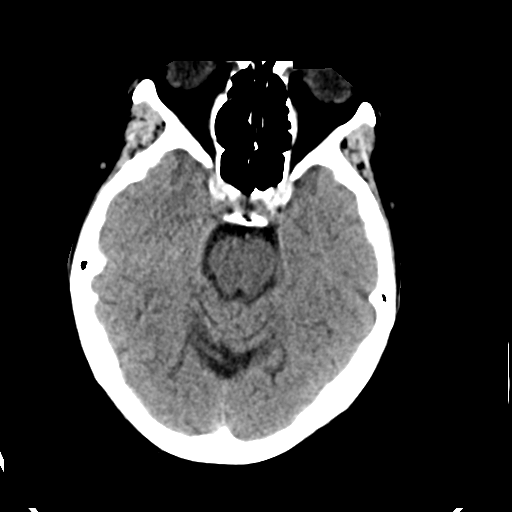
[im 18/35  brain]
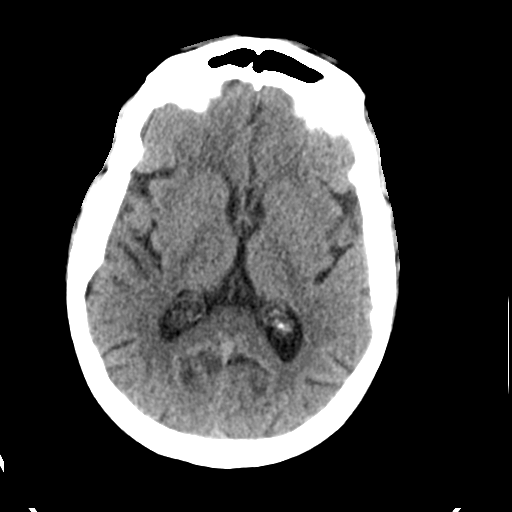
[im 22/35  brain]
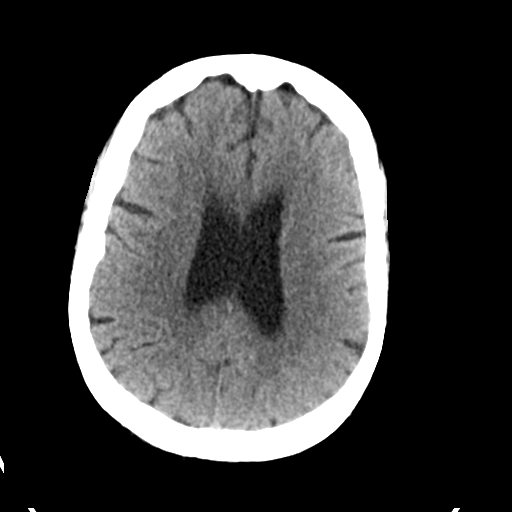
[im 22/35  bone]
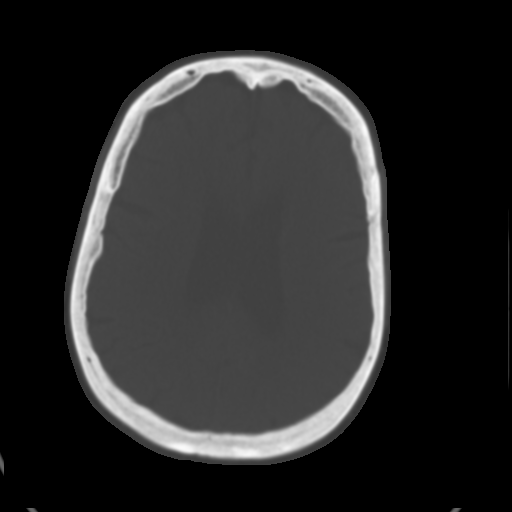
[im 26/35  brain]
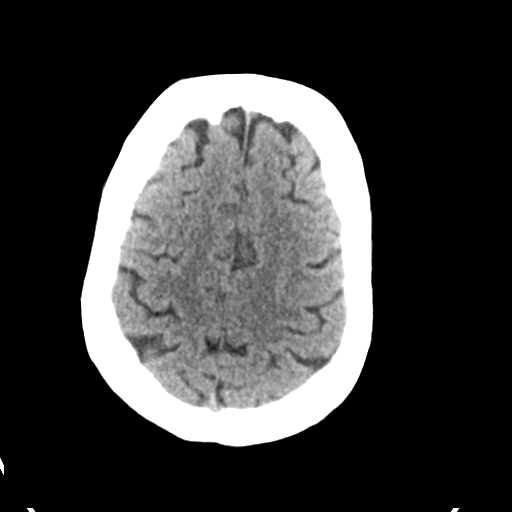
[im 30/35  brain]
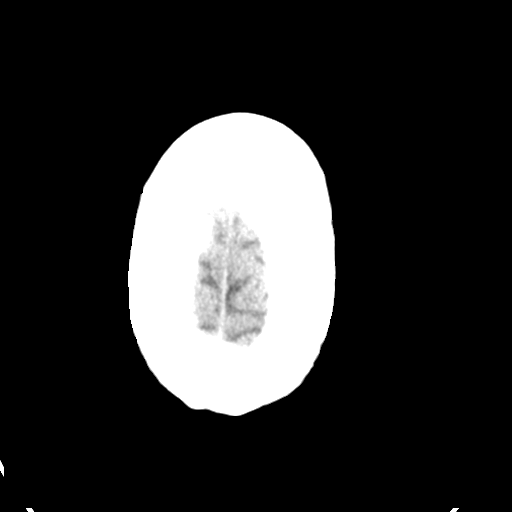

[Series 5: head without cor · coronal · non-contrast · 0.34mm/px · 3 of 78 slices shown]
[im 26/78  brain]
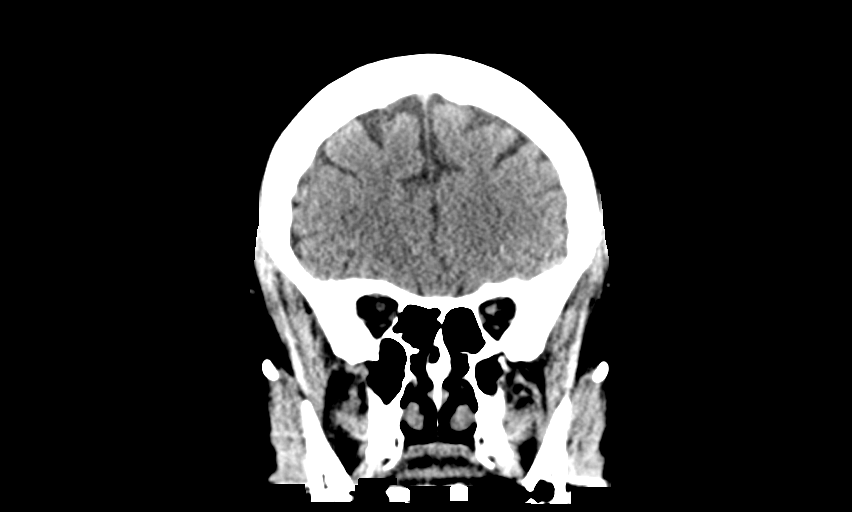
[im 35/78  brain]
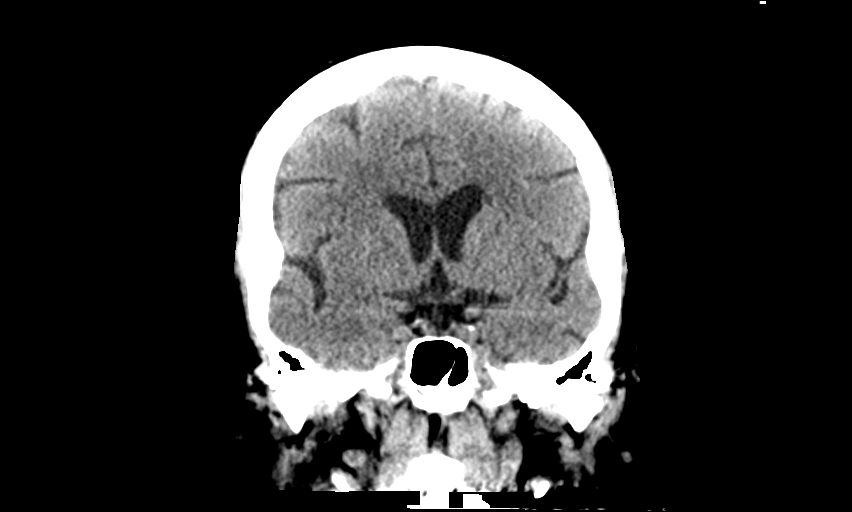
[im 43/78  brain]
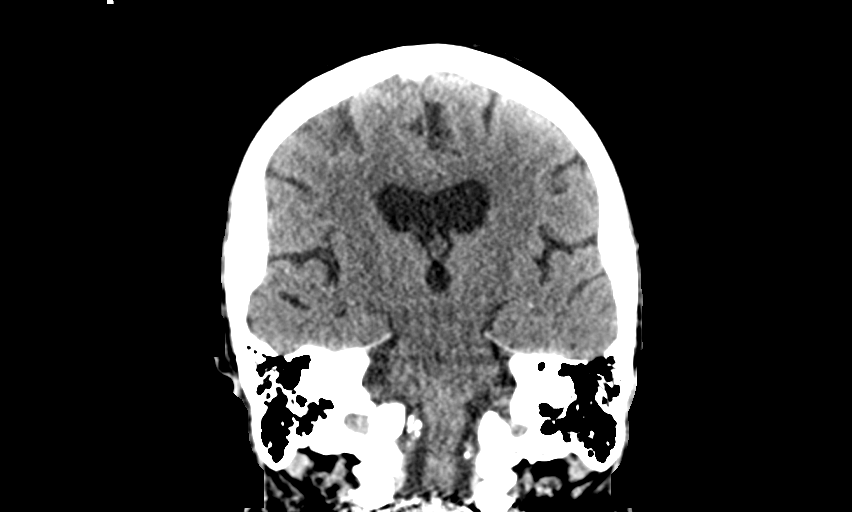

[Series 6: head without sag · sagittal · non-contrast · 0.34mm/px · 3 of 65 slices shown]
[im 22/65  brain]
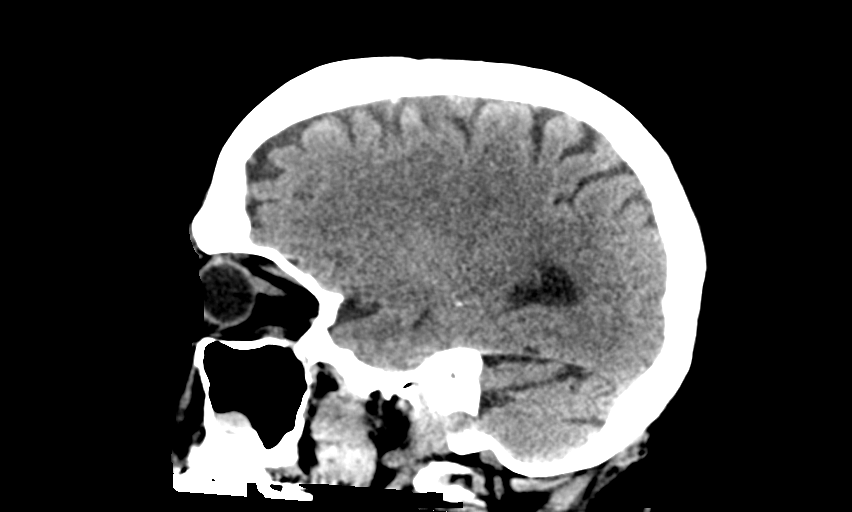
[im 33/65  brain]
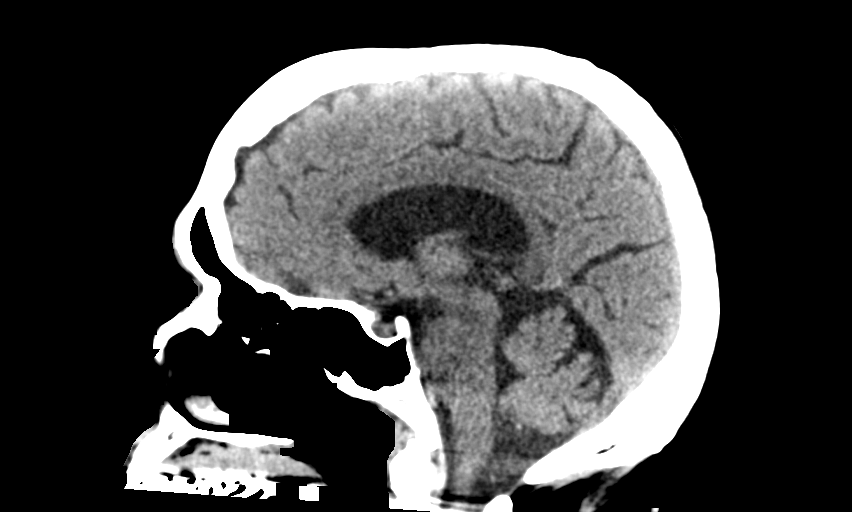
[im 43/65  brain]
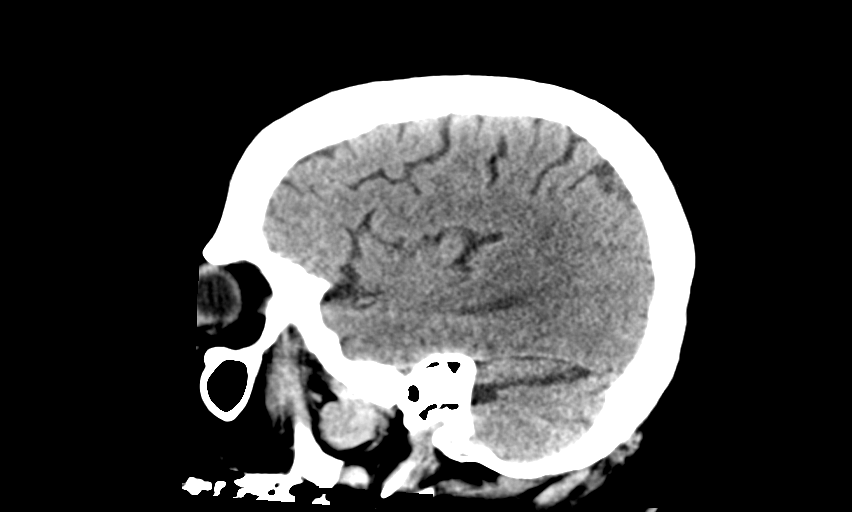

[13 of 47 positions shown; findings below may reference images not displayed]

FINDINGS: Brain: No evidence of acute infarction, hemorrhage, hydrocephalus,
extra-axial collection or mass lesion / mass effect.

Prominence of the ventricles and sulci reflects mild cortical volume
loss. Mild cerebellar atrophy is noted.

The brainstem and fourth ventricle are within normal limits. The
basal ganglia are unremarkable in appearance. The cerebral
hemispheres demonstrate grossly normal gray-white differentiation.
No mass effect or midline shift is seen.

Vascular: No hyperdense vessel or unexpected calcification.

Skull: There is no evidence of fracture; visualized osseous
structures are unremarkable in appearance.

Sinuses/Orbits: The orbits are within normal limits. A mucus
retention cyst or polyp is noted at the right maxillary sinus. The
remaining paranasal sinuses and mastoid air cells are well-aerated.

Other: No significant soft tissue abnormalities are seen.
IMPRESSION: 1. No acute intracranial pathology seen on CT.
2. Mild cortical volume loss noted.
3. Mucus retention cyst or polyp at the right maxillary sinus.

## 2020-02-17 DIAGNOSIS — M48062 Spinal stenosis, lumbar region with neurogenic claudication: Secondary | ICD-10-CM | POA: Diagnosis not present

## 2020-02-17 DIAGNOSIS — R262 Difficulty in walking, not elsewhere classified: Secondary | ICD-10-CM | POA: Diagnosis not present

## 2020-02-19 DIAGNOSIS — R262 Difficulty in walking, not elsewhere classified: Secondary | ICD-10-CM | POA: Diagnosis not present

## 2020-02-19 DIAGNOSIS — M48062 Spinal stenosis, lumbar region with neurogenic claudication: Secondary | ICD-10-CM | POA: Diagnosis not present

## 2020-02-23 ENCOUNTER — Other Ambulatory Visit: Payer: Self-pay | Admitting: Physical Medicine and Rehabilitation

## 2020-02-24 DIAGNOSIS — M48062 Spinal stenosis, lumbar region with neurogenic claudication: Secondary | ICD-10-CM | POA: Diagnosis not present

## 2020-02-24 DIAGNOSIS — R262 Difficulty in walking, not elsewhere classified: Secondary | ICD-10-CM | POA: Diagnosis not present

## 2020-02-26 DIAGNOSIS — R262 Difficulty in walking, not elsewhere classified: Secondary | ICD-10-CM | POA: Diagnosis not present

## 2020-02-26 DIAGNOSIS — E1149 Type 2 diabetes mellitus with other diabetic neurological complication: Secondary | ICD-10-CM | POA: Diagnosis not present

## 2020-02-26 DIAGNOSIS — M48062 Spinal stenosis, lumbar region with neurogenic claudication: Secondary | ICD-10-CM | POA: Diagnosis not present

## 2020-03-03 ENCOUNTER — Telehealth: Payer: PPO

## 2020-03-08 ENCOUNTER — Telehealth: Payer: Self-pay | Admitting: Internal Medicine

## 2020-03-08 NOTE — Telephone Encounter (Signed)
PLease ask her to take 8 units of Novolog with Lunch from now on. This is probably due to change in diet and eating less Carbs, she is needing less Novolog. Continue Lantus the same

## 2020-03-08 NOTE — Telephone Encounter (Signed)
Spoken to patient's daugher and notified Dr Quin Hoop comments. Verbalized understanding.

## 2020-03-08 NOTE — Telephone Encounter (Signed)
Spoken to patient and notified Dr Shamleffer's comments. Verbalized understanding.   

## 2020-03-08 NOTE — Telephone Encounter (Signed)
Spoken to patient's daughter. She stated that patient sugar have been dropping fast in a short amount of time.  On Saturday at 12 pm blood sugar was 148, patient start eating then about 12:30 it drop to below 40. Daughter believe that due to patient changing her diet, it is dropping her sugar. They inform the [facility] nurse who told them to hold Lantus.  Patient does have an appointment on Tuesday at the [facility] office

## 2020-03-08 NOTE — Telephone Encounter (Signed)
Patient has had low sugar readings since Saturday and would like to speak to nurse / Dr. Everette Rank [phone]

## 2020-03-08 NOTE — Telephone Encounter (Signed)
Message left for patient to return my call this morning and just now

## 2020-03-09 ENCOUNTER — Encounter: Payer: Self-pay | Admitting: Internal Medicine

## 2020-03-09 ENCOUNTER — Other Ambulatory Visit: Payer: Self-pay

## 2020-03-09 ENCOUNTER — Ambulatory Visit (INDEPENDENT_AMBULATORY_CARE_PROVIDER_SITE_OTHER): Payer: PPO | Admitting: Internal Medicine

## 2020-03-09 VITALS — BP 140/90 | HR 72 | Ht 64.0 in | Wt 204.2 lb

## 2020-03-09 DIAGNOSIS — Z794 Long term (current) use of insulin: Secondary | ICD-10-CM

## 2020-03-09 DIAGNOSIS — E119 Type 2 diabetes mellitus without complications: Secondary | ICD-10-CM | POA: Diagnosis not present

## 2020-03-09 DIAGNOSIS — E1142 Type 2 diabetes mellitus with diabetic polyneuropathy: Secondary | ICD-10-CM

## 2020-03-09 LAB — POCT GLYCOSYLATED HEMOGLOBIN (HGB A1C): Hemoglobin A1C: 7.2 % — AB (ref 4.0–5.6)

## 2020-03-09 MED ORDER — LANTUS SOLOSTAR 100 UNIT/ML ~~LOC~~ SOPN: 18.0000 [IU] | PEN_INJECTOR | Freq: Every day | SUBCUTANEOUS | 11 refills | Status: DC

## 2020-03-09 MED ORDER — NOVOLOG FLEXPEN 100 UNIT/ML ~~LOC~~ SOPN: PEN_INJECTOR | SUBCUTANEOUS | 6 refills | Status: DC

## 2020-03-09 NOTE — Progress Notes (Signed)
Name: [name]  Age/ Sex: 80 y.o., female   MRN/ DOB: [id number], Jul 20, 1940     PCP: [name], [name], MD   Reason for Endocrinology Evaluation: Type 2 diabetes Mellitus  Initial Endocrine Consultative Visit:  12/28/2017    PATIENT IDENTIFIER: [name] is a 80 y.o. female with a past medical history of PAT, T2 DM, and urinary incontinence, Hx of pancreatitis. The patient has followed with Endocrinology clinic since 12/28/2017 for consultative assistance with management of her diabetes.  DIABETIC HISTORY:  [name] was diagnosed with T2DM > 10 years ago.  She is intolerant to metformin due to fecal incontinence on 2000 mg a day. Her hemoglobin A1c has ranged from 6.7% in 09/01/2017, peaking at 7.9% in 2011.   SUBJECTIVE:   During the last visit (09/03/2019): Her A1c was 7.3 %, we continued lantus and Novolog doses.   Today (03/09/2020): [name] is here for a 6 month follow up for diabetes management. She is accompanied by Hilda Blades (caregiver ) today , [name] on the phone  .  She checks her blood sugars multiple  times daily through the Dexcom . The patient has had hypoglycemic episodes since the last clinic visit. These have been occurring during the day, over the weekend, she had an episode of hypoglycemia following breakfast and after taking 10 units of Novolog, she had another hypoglycemic episode the following day at lunch down without even taking prandial insulin. She has self reduce Lantus to 18 units      HOME DIABETES REGIMEN:  Lantus 20 units daily - took 18 units  Humalog 10 units with breakfast , 10 units with lunch and 12 units with supper CF: NovoLog (BG -130/35)    CONTINUOUS GLUCOSE MONITORING RECORD INTERPRETATION    Dates of Recording: 1/10-1/23/2022  Sensor description:Dexcom G6   Results statistics:   CGM use % of time 100  Average and SD 150/25  Time in range  89 %  % Time Above 180 10  % Time above 250  < 1.0  % Time Below target  <1     Glycemic patterns summary: BG's have been optimal through th day and night   Hyperglycemic episodes  Slightly with supper   Hypoglycemic episodes occurred sporadically post-prandial   Overnight periods: Stable    DIABETIC COMPLICATIONS: Microvascular complications:   Neuropathy   Denies: retinopathy, nephropathy   Last eye exam: Completed 06/2019  Macrovascular complications:    Denies: CAD, PVD, CVA   HISTORY:  Past Medical History:  Past Medical History:  Diagnosis Date  . Anemia    during 1st pregnancy  . Anxiety   . Arthritis   . Cancer ([place])    Basal - face  . Complication of anesthesia   . Depression   . Diarrhea, functional   . Dysrhythmia    Paroysmal Atrial Tachycardia  . Essential hypertension   . Fibromyalgia   . GERD (gastroesophageal reflux disease)   . H/O acute pancreatitis   . Headache   . History of hiatal hernia   . Hyperlipemia   . Neuropathy   . Osteoporosis   . Panic attacks   . Paralysis ([place]) 01/06/2019   hands  . PAT (paroxysmal atrial tachycardia) ([place])   . Pneumonia   . PONV (postoperative nausea and vomiting)   . PSVT (paroxysmal supraventricular tachycardia) ([place])   . Reflux   . Restless leg syndrome   . Spinal cord compression ([place])   .  Type 2 diabetes mellitus ([facility])    Type II  . Vertigo    not current   Past Surgical History:  Past Surgical History:  Procedure Laterality Date  . ABDOMINAL HYSTERECTOMY    . ANKLE SURGERY Left    x 2  . ANTERIOR CERVICAL DECOMP/DISCECTOMY FUSION N/A 11/12/2017   Procedure: Cervical seven to Thoracic one  Anterior cervical decompression/discectomy/fusion with removal of old plate;  Surgeon: [name], MD;  Location: [facility];  Service: Neurosurgery;  Laterality: N/A;  . BACK SURGERY     T12-L1 fusion, Anterior Cervical fusion  . CARDIAC CATHETERIZATION N/A 02/16/2015   Procedure: Left Heart Cath and Coronary Angiography;  Surgeon: Sherren Mocha, MD;  Location: [facility];  Service: Cardiovascular;  Laterality: N/A;  . CHOLECYSTECTOMY    . COLONOSCOPY    . ESOPHAGOGASTRODUODENOSCOPY  12/21/2010   Procedure: ESOPHAGOGASTRODUODENOSCOPY (EGD);  Surgeon: Landry Dyke, MD;  Location: [facility];  Service: Endoscopy;  Laterality: N/A;  . EXCISION MORTON'S NEUROMA Right   . EYE SURGERY Bilateral    Cataract  . FRACTURE SURGERY     ankle x 2   . KNEE SURGERY Right    x 3- arthroscopy  . NECK SURGERY    . POSTERIOR CERVICAL FUSION/FORAMINOTOMY N/A 01/22/2018   Procedure: Cervical six-seven Posterior Decompression with Posterior Fixation Cervical Five to Thoracic Two;  Surgeon: [name], MD;  Location: [facility];  Service: Neurosurgery;  Laterality: N/A;  Cervical 6-7 Posteror decompression with posterior fixation Cervical 5 to Thoracic 2  . ROTATOR CUFF REPAIR Right   . TONSILLECTOMY    . TOTAL KNEE ARTHROPLASTY Left 04/10/2017   Procedure: LEFT TOTAL KNEE ARTHROPLASTY;  Surgeon: Paralee Cancel, MD;  Location: [facility];  Service: Orthopedics;  Laterality: Left;  70 mins  . UPPER GASTROINTESTINAL ENDOSCOPY    . WRIST SURGERY Left    fracture    Social History:  reports that she has quit smoking. She has never used smokeless tobacco. She reports that she does not drink alcohol and does not use drugs. Family History:  Family History  Problem Relation Age of Onset  . Heart failure Mother   . Hyperlipidemia Mother   . Lung cancer Father        Lymphoma  . Hyperlipidemia Maternal Grandmother   . Peripheral vascular disease Maternal Grandmother      HOME MEDICATIONS: Allergies as of 03/09/2020      Reactions   Amoxil [amoxicillin] Swelling, Other (See Comments)   Lips Has patient had a PCN reaction causing immediate rash, facial/tongue/throat swelling, SOB or lightheadedness with hypotension:YES Has patient had a PCN reaction causing severe rash involving mucus membranes or skin necrosis:No Has patient had a PCN reaction that required  hospitalization:No Has patient had a PCN reaction occurring within the last 10 years:Yes. If all of the above answers are "NO", then may proceed with Cephalosporin use.   Lisinopril Itching, Swelling   SWELLING REACTION UNSPECIFIED    Chocolate Hives   Demerol Nausea And Vomiting   Other Other (See Comments)   Raw food or nuts - GI pain      Medication List       Accurate as of March 09, 2020 11:54 AM. If you have any questions, ask your nurse or doctor.        acetaminophen 325 MG tablet Commonly known as: TYLENOL Take 325 mg by mouth every 6 (six) hours as needed.   aspirin EC 81 MG tablet Take 81  mg by mouth at bedtime.   BD Pen Needle Micro U/F 32G X 6 MM Misc Generic drug: Insulin Pen Needle 4 TIMES A DAY NOVOLOG(3) AND LANTUS9(1)   bisacodyl 10 MG suppository Commonly known as: Bisacodyl Laxative Place 1 suppository (10 mg total) rectally as needed for moderate constipation.   Calcium Carb-Cholecalciferol 600-200 MG-UNIT Tabs Take 1 tablet by mouth in the morning and at bedtime.   Debrox 6.5 % OTIC solution Generic drug: carbamide peroxide Place 5 drops into the left ear 2 (two) times daily.   Dexcom G6 Receiver Devi 1 Device by Does not apply route daily.   Dexcom G6 Sensor Misc 1 kit by Does not apply route as directed.   Dexcom G6 Transmitter Misc 1 kit by Does not apply route as directed.   digoxin 0.125 MG tablet Commonly known as: LANOXIN TAKE 1 TABLET BY MOUTH DAILY. PLEASE KEEP UPCOMING APPT WITH DR. Marlou Porch IN SEPT FOR ANYMORE REFILLS   docusate sodium 100 MG capsule Commonly known as: COLACE Take 100 mg by mouth daily. twice a day   DULoxetine 30 MG capsule Commonly known as: CYMBALTA TAKE 2 CAPSULES BY MOUTH EVERY DAY   ezetimibe 10 MG tablet Commonly known as: ZETIA TAKE 1 TABLET BY MOUTH EVERY DAY   glucose blood test strip Use as instructed to test blood sugars 3 times daily [id number]   Lantus SoloStar 100 UNIT/ML Solostar  Pen Generic drug: insulin glargine Inject 20 Units into the skin daily. What changed: how much to take   meloxicam 15 MG tablet Commonly known as: MOBIC TAKE 1 TABLET BY MOUTH EVERY DAY   metoprolol succinate 25 MG 24 hr tablet Commonly known as: TOPROL-XL TAKE 1 TABLET BY MOUTH DAILY. PLEASE KEEP UPCOMING APPT IN SEPT WITH DR. Marlou Porch FOR ANYMORE REFILLS   mirabegron ER 50 MG Tb24 tablet Commonly known as: Myrbetriq Take 1 tablet (50 mg total) by mouth daily.   NovoLOG FlexPen 100 UNIT/ML FlexPen Generic drug: insulin aspart Inject 10 Units into the skin daily with breakfast AND 10 Units daily with lunch AND 12 Units daily with supper. Max daily 60 units per correction scale. What changed: See the new instructions.   omega-3 acid ethyl esters 1 g capsule Commonly known as: Lovaza Take 1 capsule (1 g total) by mouth 2 (two) times daily.   omeprazole 20 MG capsule Commonly known as: PRILOSEC Take 1 capsule (20 mg total) by mouth daily.   oxyCODONE-acetaminophen 5-325 MG tablet Commonly known as: PERCOCET/ROXICET Take 0.5 tablets by mouth as needed for severe pain.   pregabalin 50 MG capsule Commonly known as: Lyrica Take 1 capsule (50 mg total) by mouth 2 (two) times daily.   Premarin vaginal cream Generic drug: conjugated estrogens Place vaginally.   PRESERVISION AREDS 2 PO Take 1 tablet by mouth 2 (two) times daily.   ramipril 10 MG capsule Commonly known as: ALTACE TAKE 1 CAPSULE (10 MG TOTAL) BY MOUTH EVERY EVENING.   RELION INSULIN SYRINGE 1ML/31G 31G X 5/16" 1 ML Misc Generic drug: Insulin Syringe-Needle U-100 Use as directed   rOPINIRole 1 MG tablet Commonly known as: REQUIP TAKE ONE TABLET BY MOUTH AT BEDTIME.   rosuvastatin 10 MG tablet Commonly known as: Crestor Take 1 tablet (10 mg total) by mouth daily.   verapamil 120 MG tablet Commonly known as: CALAN Take 120 mg by mouth daily as needed (afib).   verapamil 240 MG 24 hr capsule Commonly  known as: VERELAN PM TAKE 1 CAPSULE BY  MOUTH EVERY DAY   VITAMIN B 12 PO Take 1,000 mg by mouth daily.   VITAMIN D PO Take 10,000 Units by mouth daily. 10,000 in the am and 5,000 in the pm        OBJECTIVE:   Vital Signs: BP 140/90   Pulse 72   Ht 5' 4"  (1.626 m)   Wt 204 lb 4 oz (92.6 kg)   SpO2 97%   BMI 35.06 kg/m   Wt Readings from Last 3 Encounters:  03/09/20 204 lb 4 oz (92.6 kg)  01/27/20 203 lb (92.1 kg)  12/12/19 206 lb 8 oz (93.7 kg)     Exam: General: Pt appears well and is in NAD  Lungs: Clear with good BS bilat   Heart: RRR   Extremities: No pretibial edema.  Neuro: MS is good with appropriate affect, pt is alert and Ox3    DM foot exam:03/09/2020 The skin of the feet is intact without sores or ulcerations. The pedal pulses are 2+ on right and 2+ on left. The sensation is decreased to a screening 5.07, 10 gram monofilament bilaterally     DATA REVIEWED:  Lab Results  Component Value Date   HGBA1C 7.2 (A) 03/09/2020   HGBA1C 7.7 (A) 09/03/2019   HGBA1C 7.3 (A) 05/05/2019   Lab Results  Component Value Date   MICROALBUR 23.9 (H) 09/03/2019   [place]  12/12/2019     Comment:     . LDL cholesterol not calculated. Triglyceride levels greater than 400 mg/dL invalidate calculated LDL results. . Reference range: <100 . Desirable range <100 mg/dL for primary prevention;   <70 mg/dL for patients with CHD or diabetic patients  with > or = 2 CHD risk factors. Marland Kitchen LDL-C is now calculated using the Martin-Hopkins  calculation, which is a validated novel method providing  better accuracy than the Friedewald equation in the  estimation of LDL-C.  Cresenciano Genre et al. Annamaria Helling. 2836;629(47): 2061-2068  (http://education.[employer].com/faq/FAQ164)    CREATININE 1.24 (H) 09/05/2019   Lab Results  Component Value Date   MICRALBCREAT 15.4 09/03/2019   Results for [name], [name] (MRN [id number]) as of 09/04/2019 08:40  Ref. Range 09/03/2019 11:09   Sodium Latest Ref Range: 135 - 145 mEq/L 135  Potassium Latest Ref Range: 3.5 - 5.1 mEq/L 4.5  Chloride Latest Ref Range: 96 - 112 mEq/L 101  CO2 Latest Ref Range: 19 - 32 mEq/L 26  Glucose Latest Ref Range: 70 - 99 mg/dL 152 (H)  BUN Latest Ref Range: 6 - 23 mg/dL 27 (H)  Creatinine Latest Ref Range: 0.40 - 1.20 mg/dL 1.14  Calcium Latest Ref Range: 8.4 - 10.5 mg/dL 9.9  GFR Latest Ref Range: >60.00 mL/min 46.21 (L)  MICROALB/CREAT RATIO Latest Ref Range: 0.0 - 30.0 mg/g 15.4  VITD Latest Ref Range: 30.00 - 100.00 ng/mL 66.83  Vitamin B12 Latest Ref Range: 211 - 911 pg/mL 190 (L)  TSH Latest Ref Range: 0.35 - 4.50 uIU/mL 1.25    ASSESSMENT / PLAN / RECOMMENDATIONS:   1) Type 2 Diabetes Mellitus, Sub-Optimally Controlled, With  neuropathic complications - Most recent A1c of 7.2 %. Goal A1c < 7.0 %.    - A1c trending down  - She has changed her diet ~ 2 months ago with smaller portions and low carb diet , hence the hypoglycemia, will adjust insulin as below   - intolerant to metformin, has questionable hx of pancreatitis hence reluctant to start GLP-1 agonists. Not a candidate for SGLT- 2  inhibitors due to recurrent UTI's   MEDICATIONS:  Continue Lantus 18 units once a day  Decrease NovoLog to 8 units with Breakfast and 8 units with Lunch and 10 units with  Supper   CF: (BG- 130/35)    EDUCATION / INSTRUCTIONS:  BG monitoring instructions: Patient is instructed to check her blood sugars 4 times a day, before meals and bedtime.  Call [facility] if: BG persistently < 70 . I reviewed the Rule of 15 for the treatment of hypoglycemia in detail with the patient. Literature supplied.  2) Diabetic complications:   Eye: Does not have known diabetic retinopathy.   Neuro/ Feet: Does have known diabetic peripheral neuropathy .   Renal: Patient does not have known baseline CKD. She   is on an ACEI/ARB at present.       F/U in 4 months    signed  electronically by: Mack Guise, MD  [facility]  [facility] [address] [place], [place] [address] Phone: [phone] FAX: [phone]   CC: [name], [name], [place] [address] Phone: [phone]  Fax: [phone]  Return to Endocrinology clinic as below: Future Appointments  Date Time Provider [place]  04/08/2020  4:00 PM [facility] CCM PHARMACIST [facility]  04/26/2020 10:40 AM Raulkar, Clide Deutscher, MD [facility]  11/16/2020 10:15 AM [facility] HEALTH ADVISOR 2 [facility]

## 2020-03-09 NOTE — Patient Instructions (Addendum)
-   Continue Lantus 18 units ONCE a day  - Novolog 8 units with Breakfast and 8 units with Lunch and 10 units Supper  - Novolog correctional insulin: ADD extra units on insulin to your meal-time Novolog dose if your blood sugars are higher than 165. Use the scale below to help guide you:   Blood sugar before meal Number of units to inject  Less than 165 0 unit  166 -  200 1 units  201 -  235 2 units  236 -  270 3 units  271 -  305 4 units  306 -  340 5 units  341 -  375 6 units       HOW TO TREAT LOW BLOOD SUGARS (Blood sugar LESS THAN 70 MG/DL)  Please follow the RULE OF 15 for the treatment of hypoglycemia treatment (when your (blood sugars are less than 70 mg/dL)    STEP 1: Take 15 grams of carbohydrates when your blood sugar is low, which includes:   3-4 GLUCOSE TABS  OR  3-4 OZ OF JUICE OR REGULAR SODA OR  ONE TUBE OF GLUCOSE GEL     STEP 2: RECHECK blood sugar in 15 MINUTES STEP 3: If your blood sugar is still low at the 15 minute recheck --> then, go back to STEP 1 and treat AGAIN with another 15 grams of carbohydrates.

## 2020-03-10 ENCOUNTER — Other Ambulatory Visit: Payer: Self-pay | Admitting: Family Medicine

## 2020-03-11 ENCOUNTER — Other Ambulatory Visit: Payer: Self-pay | Admitting: Family Medicine

## 2020-03-11 ENCOUNTER — Other Ambulatory Visit: Payer: Self-pay | Admitting: Physical Medicine and Rehabilitation

## 2020-03-11 DIAGNOSIS — M48062 Spinal stenosis, lumbar region with neurogenic claudication: Secondary | ICD-10-CM | POA: Diagnosis not present

## 2020-03-11 DIAGNOSIS — R262 Difficulty in walking, not elsewhere classified: Secondary | ICD-10-CM | POA: Diagnosis not present

## 2020-03-11 DIAGNOSIS — I10 Essential (primary) hypertension: Secondary | ICD-10-CM

## 2020-03-11 DIAGNOSIS — G2581 Restless legs syndrome: Secondary | ICD-10-CM

## 2020-03-15 ENCOUNTER — Encounter: Payer: Self-pay | Admitting: Family Medicine

## 2020-03-15 ENCOUNTER — Ambulatory Visit (INDEPENDENT_AMBULATORY_CARE_PROVIDER_SITE_OTHER): Payer: PPO | Admitting: Family Medicine

## 2020-03-15 ENCOUNTER — Ambulatory Visit (INDEPENDENT_AMBULATORY_CARE_PROVIDER_SITE_OTHER): Payer: PPO

## 2020-03-15 ENCOUNTER — Other Ambulatory Visit: Payer: Self-pay

## 2020-03-15 VITALS — BP 124/70 | HR 70 | Resp 16 | Ht 64.0 in | Wt 206.2 lb

## 2020-03-15 DIAGNOSIS — M797 Fibromyalgia: Secondary | ICD-10-CM

## 2020-03-15 DIAGNOSIS — I700 Atherosclerosis of aorta: Secondary | ICD-10-CM

## 2020-03-15 DIAGNOSIS — M19072 Primary osteoarthritis, left ankle and foot: Secondary | ICD-10-CM | POA: Diagnosis not present

## 2020-03-15 DIAGNOSIS — I10 Essential (primary) hypertension: Secondary | ICD-10-CM | POA: Diagnosis not present

## 2020-03-15 DIAGNOSIS — M79672 Pain in left foot: Secondary | ICD-10-CM | POA: Diagnosis not present

## 2020-03-15 DIAGNOSIS — I471 Supraventricular tachycardia: Secondary | ICD-10-CM

## 2020-03-15 DIAGNOSIS — M48062 Spinal stenosis, lumbar region with neurogenic claudication: Secondary | ICD-10-CM | POA: Diagnosis not present

## 2020-03-15 DIAGNOSIS — R262 Difficulty in walking, not elsewhere classified: Secondary | ICD-10-CM | POA: Diagnosis not present

## 2020-03-15 DIAGNOSIS — M7732 Calcaneal spur, left foot: Secondary | ICD-10-CM | POA: Diagnosis not present

## 2020-03-15 NOTE — Progress Notes (Signed)
Chief Complaint  Patient presents with  . Foot Pain    Left foot, swollen around toe on the top & bottom x 2 days. Had PT this morning and they stretched it and did exercises - pain isn't as bad after that, but still painful.  . Follow-up   HPI: [name] is a 80 y.o. female with hx of fibromyalgia,DM II,depression,peripheral neuropathy,and thoracic myelopathy here today with her friend complaining of left foot pain as described above. She is reporting this as a new problem. Pain is exacerbated by walking, alleviated by rest. No hx of trauma or unusual activity. She has been more active since started PT, walking more. Pain is moderate, intermittent. Feels like "clothes" is under her foot.  Had PT this morning and foot massage helped with pain. + Edema of foot and LE's, the latter one is not uncommon for her. She has not noted erythema, ecchymosis,or deformities.  Negative for CP,palpitations,SOB,or diaphoresis. She is taking Tylenol. She takes Meloxicam 15 mg daily prn.  She was last seen on 12/12/2019.  HTN on Ramipril 10 mg daily. SVT, she is also on Metoprolol succinate 25 mg daily and Verapamil 240 mg daily. She follows with cardiologist.  Aortic atherosclerosis seen on imaging before, chest CT in 09/2019. She is on Crestor 10 mg daily and Aspirin 81 mg daily.  Lab Results  Component Value Date   CREATININE 1.24 (H) 09/05/2019   BUN 30 (H) 09/05/2019   NA 133 (L) 09/05/2019   K 4.4 09/05/2019   CL 101 09/05/2019   CO2 23 09/05/2019   She follows with chronic pain management and neurosurgeon. She did not tolerate Percocet. Currently she is on Cymbalta 30 mg 2 tabs daily and Lyrica 50 mg bid.  She feels like hands/IP joints ROM is improving and in general she feels better since PT was resumed.  Review of Systems  Constitutional: Negative for activity change, appetite change and fever.  Respiratory: Negative for cough and wheezing.    Gastrointestinal: Negative for abdominal pain, nausea and vomiting.       Negative for changes in bowel habits.  Genitourinary: Negative for decreased urine volume, dysuria and hematuria.  Musculoskeletal: Positive for arthralgias, gait problem and myalgias.  Neurological: Negative for syncope and headaches.  Psychiatric/Behavioral: Negative for confusion. The patient is nervous/anxious.   Rest see pertinent positives and negatives per HPI.  Current Outpatient Medications on File Prior to Visit  Medication Sig Dispense Refill  . acetaminophen (TYLENOL) 325 MG tablet Take 325 mg by mouth every 6 (six) hours as needed.    Marland Kitchen aspirin EC 81 MG tablet Take 81 mg by mouth at bedtime.     . bisacodyl (BISACODYL LAXATIVE) 10 MG suppository Place 1 suppository (10 mg total) rectally as needed for moderate constipation. 12 suppository 1  . Calcium Carb-Cholecalciferol 600-200 MG-UNIT TABS Take 1 tablet by mouth in the morning and at bedtime.     . carbamide peroxide (DEBROX) 6.5 % OTIC solution Place 5 drops into the left ear 2 (two) times daily. 15 mL 0  . Continuous Blood Gluc Receiver (DEXCOM G6 RECEIVER) DEVI 1 Device by Does not apply route daily. 1 Device 0  . Continuous Blood Gluc Sensor (DEXCOM G6 SENSOR) MISC 1 kit by Does not apply route as directed. 3 each 3  . Continuous Blood Gluc Transmit (DEXCOM G6 TRANSMITTER) MISC 1 kit by Does not apply route as directed. 1 each 3  . Cyanocobalamin (VITAMIN B 12  PO) Take 1,000 mg by mouth daily.    . digoxin (LANOXIN) 0.125 MG tablet TAKE 1 TABLET BY MOUTH DAILY. PLEASE KEEP UPCOMING APPT WITH DR. Marlou Porch IN SEPT FOR ANYMORE REFILLS 90 tablet 2  . docusate sodium (COLACE) 100 MG capsule Take 100 mg by mouth daily. twice a day    . DULoxetine (CYMBALTA) 30 MG capsule TAKE 2 CAPSULES BY MOUTH EVERY DAY 180 capsule 2  . ezetimibe (ZETIA) 10 MG tablet TAKE 1 TABLET BY MOUTH EVERY DAY 90 tablet 2  . glucose blood test strip Use as instructed to test blood  sugars 3 times daily DX.E11.42 100 each 12  . insulin aspart (NOVOLOG FLEXPEN) 100 UNIT/ML FlexPen Inject 8 Units into the skin daily with breakfast AND 8 Units daily with lunch AND 10 Units daily with supper. Max daily 60 units per correction scale. 45 mL 6  . insulin glargine (LANTUS SOLOSTAR) 100 UNIT/ML Solostar Pen Inject 18 Units into the skin daily. 15 mL 11  . Insulin Pen Needle (BD PEN NEEDLE MICRO U/F) 32G X 6 MM MISC 4 TIMES A DAY NOVOLOG(3) AND LANTUS9(1) 150 each 11  . meloxicam (MOBIC) 15 MG tablet TAKE 1 TABLET BY MOUTH EVERY DAY 30 tablet 0  . metoprolol succinate (TOPROL-XL) 25 MG 24 hr tablet TAKE 1 TABLET BY MOUTH DAILY. PLEASE KEEP UPCOMING APPT IN SEPT WITH DR. Marlou Porch FOR ANYMORE REFILLS 90 tablet 2  . mirabegron ER (MYRBETRIQ) 50 MG TB24 tablet Take 1 tablet (50 mg total) by mouth daily. 90 tablet 2  . Multiple Vitamins-Minerals (PRESERVISION AREDS 2 PO) Take 1 tablet by mouth 2 (two) times daily.     Marland Kitchen omega-3 acid ethyl esters (LOVAZA) 1 g capsule TAKE 1 CAPSULE BY MOUTH 2 TIMES DAILY. 180 capsule 1  . omeprazole (PRILOSEC) 20 MG capsule Take 1 capsule (20 mg total) by mouth daily. 90 capsule 2  . pregabalin (LYRICA) 50 MG capsule Take 1 capsule (50 mg total) by mouth 2 (two) times daily. 60 capsule 1  . PREMARIN vaginal cream Place vaginally.    . ramipril (ALTACE) 10 MG capsule TAKE 1 CAPSULE (10 MG TOTAL) BY MOUTH EVERY EVENING. 90 capsule 2  . RELION INSULIN SYRINGE 1ML/31G 31G X 5/16" 1 ML MISC Use as directed 100 each 1  . rOPINIRole (REQUIP) 1 MG tablet TAKE ONE TABLET BY MOUTH AT BEDTIME. 90 tablet 1  . rosuvastatin (CRESTOR) 10 MG tablet Take 1 tablet (10 mg total) by mouth daily. 90 tablet 1  . verapamil (CALAN) 120 MG tablet Take 120 mg by mouth daily as needed (afib).     . verapamil (VERELAN PM) 240 MG 24 hr capsule TAKE 1 CAPSULE BY MOUTH EVERY DAY 90 capsule 3  . VITAMIN D PO Take 10,000 Units by mouth daily. 10,000 in the am and 5,000 in the pm     No  current facility-administered medications on file prior to visit.   Past Medical History:  Diagnosis Date  . Anemia    during 1st pregnancy  . Anxiety   . Arthritis   . Cancer ([place])    Basal - face  . Complication of anesthesia   . Depression   . Diarrhea, functional   . Dysrhythmia    Paroysmal Atrial Tachycardia  . Essential hypertension   . Fibromyalgia   . GERD (gastroesophageal reflux disease)   . H/O acute pancreatitis   . Headache   . History of hiatal hernia   . Hyperlipemia   .  Neuropathy   . Osteoporosis   . Panic attacks   . Paralysis ([place]) 01/06/2019   hands  . PAT (paroxysmal atrial tachycardia) ([place])   . Pneumonia   . PONV (postoperative nausea and vomiting)   . PSVT (paroxysmal supraventricular tachycardia) ([place])   . Reflux   . Restless leg syndrome   . Spinal cord compression ([place])   . Type 2 diabetes mellitus (H[facility])    Type II  . Vertigo    not current   Allergies  Allergen Reactions  . Amoxil [Amoxicillin] Swelling and Other (See Comments)    Lips Has patient had a PCN reaction causing immediate rash, facial/tongue/throat swelling, SOB or lightheadedness with hypotension:YES Has patient had a PCN reaction causing severe rash involving mucus membranes or skin necrosis:No Has patient had a PCN reaction that required hospitalization:No Has patient had a PCN reaction occurring within the last 10 years:Yes. If all of the above answers are "NO", then may proceed with Cephalosporin use.   Marland Kitchen Lisinopril Itching and Swelling    SWELLING REACTION UNSPECIFIED   . Chocolate Hives  . Demerol Nausea And Vomiting  . Other Other (See Comments)    Raw food or nuts - GI pain    Social History   Socioeconomic History  . Marital status: Widowed    Spouse name: Glendell Docker  . Number of children: 2  . Years of education: Not on file  . Highest education level: Not on file  Occupational History  . Occupation: Retired  Tobacco Use  . Smoking status: Former Research scientist (life sciences)   . Smokeless tobacco: Never Used  . Tobacco comment: smoke 1 cigarette every few weeks when the [place] lady came by.  Vaping Use  . Vaping Use: Never used  Substance and Sexual Activity  . Alcohol use: No  . Drug use: No  . Sexual activity: Not on file  Other Topics Concern  . Not on file  Social History Narrative   Lives home alone.  Daughter [name] with her today.   Independent of ADLs.  Education HS.  Widowed.     Social Determinants of Health   Financial Resource Strain: Low Risk   . Difficulty of Paying Living Expenses: Not hard at all  Food Insecurity: No Food Insecurity  . Worried About Charity fundraiser in the Last Year: Never true  . Ran Out of Food in the Last Year: Never true  Transportation Needs: No Transportation Needs  . Lack of Transportation (Medical): No  . Lack of Transportation (Non-Medical): No  Physical Activity: Inactive  . Days of Exercise per Week: 0 days  . Minutes of Exercise per Session: 0 min  Stress: Stress Concern Present  . Feeling of Stress : Rather much  Social Connections: Moderately Isolated  . Frequency of Communication with Friends and Family: More than three times a week  . Frequency of Social Gatherings with Friends and Family: Never  . Attends Religious Services: 1 to 4 times per year  . Active Member of Clubs or Organizations: No  . Attends Archivist Meetings: Never  . Marital Status: Widowed   Vitals:   03/15/20 1432  BP: 124/70  Pulse: 70  Resp: 16  SpO2: 96%   Body mass index is 35.4 kg/m.  Physical Exam Vitals and nursing note reviewed.  HENT:     Head: Normocephalic and atraumatic.  Eyes:     Conjunctiva/sclera: Conjunctivae normal.  Cardiovascular:     Rate and Rhythm: Normal rate and regular  rhythm.     Heart sounds: No murmur heard.     Comments: Trace pitting LE edema. Left DP and PT present. Pulmonary:     Effort: Pulmonary effort is normal. No respiratory distress.     Breath sounds: Normal  breath sounds.  Musculoskeletal:     Right hand: Deformity (Contractions and muscle atrophy) present. Decreased range of motion.     Left ankle: No tenderness. Decreased range of motion.     Left Achilles Tendon: No tenderness.     Left foot: Decreased range of motion. Normal capillary refill. Tenderness present. No deformity or crepitus. Normal pulse.       Feet:     Comments: Tenderness upon calf palpation. Pain is not elicited with foot dorsiflexion, which is limited. There is no pain upon palpation of tarsus,metatarsus,and most MTP/IP joints. No signs of synovitis. Mild non pitting pedal edema.   Neurological:     Mental Status: She is alert and oriented to person, place, and time.     Comments: Unstable gait, assisted with a walker.   ASSESSMENT AND PLAN:  [name] was seen today for foot pain and follow-up.  Diagnoses and all orders for this visit:  Left foot pain We discussed possible etiologies, could be caused by OA. ? Morton neuroma.  Because new and she has been walking more + neuropathy, foot imaging obtained today. Stretching exercises helped,so recommend continuing. Further recommendations will be given according to X ray. If pain continues podiatrist evaluation can be considered.  -     DG Foot Complete Left; Future  Fibromyalgia LLE pain could be related t this problem. There is no local edema or erythema, so I do not think [place] is needed at thsi time to evaluate for DVT, she agrees and understands what she needs to monitor for. Continue Lyrica and Duloxetine. PT has helped. Fall precautions discussed. Caution with NSAID's. Following with pain management.  Essential hypertension, benign BP adequately controlled. Continue Ramipril 10 mg daily,Verapamil 240 mg daily, and Metoprolol succinate 25 mg daily.  Aortic atherosclerosis (HCC) Continue Crestor 10 mg and Aspirin 81 mg daily.  SVT (supraventricular tachycardia) (HCC) Well controlled. Continue  Metoprolol Succinate 25 mg daily and Verapamil 240 mg daily. Follows with cardiologist.  Return if symptoms worsen or fail to improve.   Daylene Vandenbosch G. [name], MD  [facility]. [place] office.  A few things to remember from today's visit:  Foot pain and left calf pain could be related to fibromyalgia and arthritis. Question of morton neuroma left foot, you can see a podiatrist if persistent. Try topical rubbing meds like icy hot. Wear comfortable shoes.  If you need refills please call your pharmacy. Do not use My Chart to request refills or for acute issues that need immediate attention.    Please be sure medication list is accurate. If a new problem present, please set up appointment sooner than planned today.

## 2020-03-15 NOTE — Patient Instructions (Signed)
A few things to remember from today's visit:  Foot pain and left calf pain could be related to fibromyalgia and arthritis. Question of morton neuroma left foot, you can see a podiatrist if persistent. Try topical rubbing meds like icy hot. Wear comfortable shoes.  If you need refills please call your pharmacy. Do not use My Chart to request refills or for acute issues that need immediate attention.    Please be sure medication list is accurate. If a new problem present, please set up appointment sooner than planned today.

## 2020-03-18 ENCOUNTER — Encounter: Payer: Self-pay | Admitting: Family Medicine

## 2020-03-18 DIAGNOSIS — R262 Difficulty in walking, not elsewhere classified: Secondary | ICD-10-CM | POA: Diagnosis not present

## 2020-03-18 DIAGNOSIS — M48062 Spinal stenosis, lumbar region with neurogenic claudication: Secondary | ICD-10-CM | POA: Diagnosis not present

## 2020-03-23 ENCOUNTER — Telehealth: Payer: Self-pay

## 2020-03-23 DIAGNOSIS — R197 Diarrhea, unspecified: Secondary | ICD-10-CM

## 2020-03-23 NOTE — Telephone Encounter (Signed)
Roderic Palau from [facility] called in to report that pt is having diarrhea. They want to rule out C. Diff and want to know if you can order the test?

## 2020-03-24 ENCOUNTER — Other Ambulatory Visit: Payer: PPO

## 2020-03-24 NOTE — Telephone Encounter (Signed)
I left pt a voicemail to call the office back. Need to know if pt is having any stomach tenderness/pain, fever, loss of appetite or nausea.

## 2020-03-24 NOTE — Addendum Note (Signed)
Addended by: Rodrigo Ran on: 03/24/2020 08:44 AM   Modules accepted: Orders

## 2020-03-24 NOTE — Telephone Encounter (Signed)
I spoke with pt's daughter. Pt has been having diarrhea that started yesterday - home health agency has concern for possible C diff infection. Pt's daughter wasn't sure if pt was having abdominal pain or stomach tenderness, just knew that the home health agency wanted to rule out C diff. I spoke with Dr. [name]. Pt's caregiver will come pick up the supplies to take to pt.

## 2020-03-29 DIAGNOSIS — E1149 Type 2 diabetes mellitus with other diabetic neurological complication: Secondary | ICD-10-CM | POA: Diagnosis not present

## 2020-03-31 DIAGNOSIS — K591 Functional diarrhea: Secondary | ICD-10-CM | POA: Diagnosis not present

## 2020-04-05 ENCOUNTER — Other Ambulatory Visit: Payer: Self-pay | Admitting: Physical Medicine and Rehabilitation

## 2020-04-06 DIAGNOSIS — M48062 Spinal stenosis, lumbar region with neurogenic claudication: Secondary | ICD-10-CM | POA: Diagnosis not present

## 2020-04-06 DIAGNOSIS — R262 Difficulty in walking, not elsewhere classified: Secondary | ICD-10-CM | POA: Diagnosis not present

## 2020-04-08 ENCOUNTER — Ambulatory Visit (INDEPENDENT_AMBULATORY_CARE_PROVIDER_SITE_OTHER): Payer: PPO | Admitting: Pharmacist

## 2020-04-08 DIAGNOSIS — R262 Difficulty in walking, not elsewhere classified: Secondary | ICD-10-CM | POA: Diagnosis not present

## 2020-04-08 DIAGNOSIS — I10 Essential (primary) hypertension: Secondary | ICD-10-CM

## 2020-04-08 DIAGNOSIS — E785 Hyperlipidemia, unspecified: Secondary | ICD-10-CM | POA: Diagnosis not present

## 2020-04-08 DIAGNOSIS — E1142 Type 2 diabetes mellitus with diabetic polyneuropathy: Secondary | ICD-10-CM

## 2020-04-08 DIAGNOSIS — Z794 Long term (current) use of insulin: Secondary | ICD-10-CM | POA: Diagnosis not present

## 2020-04-08 DIAGNOSIS — M48062 Spinal stenosis, lumbar region with neurogenic claudication: Secondary | ICD-10-CM | POA: Diagnosis not present

## 2020-04-08 NOTE — Progress Notes (Signed)
Chronic Care Management Pharmacy Note  04/23/2020 Name:  [name] MRN:  [id number] DOB:  06/30/1940  Subjective: [name] is an 80 y.o. year old female who is a primary patient of [name], Malka So, MD.  The CCM team was consulted for assistance with disease management and care coordination needs.    Engaged with patient by telephone for follow up visit in response to provider referral for pharmacy case management and/or care coordination services.   Consent to Services:  The patient was given information about Chronic Care Management services, agreed to services, and gave verbal consent prior to initiation of services.  Please see initial visit note for detailed documentation.   Patient Care Team: [name], [name], MD as PCP - General (Family Medicine) Jerline Pain, MD as PCP - Cardiology (Cardiology) Viona Gilmore, [facility] as Pharmacist (Pharmacist)  Recent office visits: 03/15/20 Betty [name], MD: Patient presented for left foot pain.   12/12/19 Betty [name], MD: Patient presented for follow up for chronic conditions. Increased Myrbetriq to 50 mg daily.   11/11/19 Charlott Rakes, LPN: Patient presented for Medicare annual wellness visit.   10/14/19 Mychart message w/Dr. [name]: Patient did not want to start atorvastatin as it made her hurt all over. Dr. [name] switched to rosuvastatin 10 mg daily.   Recent consult visits: 03/09/20 Ibtehal Shamleffer (endo): Patient presented for diabetes follow up. Decreased Novolog to 8 units with breakfast and lunch and 10 units with supper.   01/27/20 Leeroy Cha, MD (physical medicine and rehab): Patient presented for 6 week follow up for OA.  01/23/20 [name], MD [facility] neurosurgery): Unable to access notes.  01/02/20 Bjorn Loser (urology): Unable to access notes.  11/12/19 Leeroy Cha, MD (Physical Medicine and Rehabilitation): Patient presented for fibromyalgia follow up. Continued Cymbalta 30 mg and  increased Lyrica to 25 mg BID. Provided referral to urology for nocturia.  11/05/19 Candee Furbish, MD (cardiology): Patient presented for SVT, HTN and atherosclerosis follow up. Patient was switched to Crestor 10 mg in review of outside office messages. Dr. [name] is monitoring A1c. Continue current medications and follow up in 1 year.  10/17/19 Vivi Ferns, PT: Patient presented for PT. Unable to access notes.  Hospital visits: None in previous 6 months  Objective:  Lab Results  Component Value Date   CREATININE 1.24 (H) 09/05/2019   BUN 30 (H) 09/05/2019   GFR 46.21 (L) 09/03/2019   GFRNONAA >60 01/08/2019   GFRAA >60 01/08/2019   NA 133 (L) 09/05/2019   K 4.4 09/05/2019   CALCIUM 10.0 09/05/2019   CO2 23 09/05/2019    Lab Results  Component Value Date/Time   HGBA1C 7.2 (A) 03/09/2020 10:43 AM   HGBA1C 7.7 (A) 09/03/2019 10:42 AM   HGBA1C 7.6 (H) 11/12/2017 10:29 AM   HGBA1C 6.7 (H) 09/04/2017 12:12 PM   HGBA1C 7.7 01/20/2016 12:00 AM   GFR 46.21 (L) 09/03/2019 11:09 AM   GFR 57.90 (L) 08/23/2018 01:27 PM   MICROALBUR 23.9 (H) 09/03/2019 11:09 AM   MICROALBUR 26.1 (H) 01/12/2017 11:42 AM    Last diabetic Eye exam:  Lab Results  Component Value Date/Time   HMDIABEYEEXA Retinopathy (A) 09/17/2017 12:00 AM    Last diabetic Foot exam: No results found for: HMDIABFOOTEX   Lab Results  Component Value Date   CHOL 148 12/12/2019   HDL 30 (L) 12/12/2019   Bryan  12/12/2019     Comment:     . LDL cholesterol not calculated. Triglyceride  levels greater than 400 mg/dL invalidate calculated LDL results. . Reference range: <100 . Desirable range <100 mg/dL for primary prevention;   <70 mg/dL for patients with CHD or diabetic patients  with > or = 2 CHD risk factors. Marland Kitchen LDL-C is now calculated using the Martin-Hopkins  calculation, which is a validated novel method providing  better accuracy than the Friedewald equation in the  estimation of LDL-C.  Cresenciano Genre et al.  Annamaria Helling. 1017;510(25): 2061-2068  (http://education.[employer].com/faq/FAQ164)    TRIG 414 (H) 12/12/2019   CHOLHDL 4.9 12/12/2019    Hepatic Function Latest Ref Rng & Units 05/04/2017 01/20/2016 02/16/2015  Total Protein 6.0 - 8.3 g/dL 6.8 - 7.0  Albumin 3.5 - 5.2 g/dL 4.2 - 4.2  AST 0 - 37 U/L 16 23 27   ALT 0 - 35 U/L 15 28 20   Alk Phosphatase 39 - 117 U/L 98 - 59  Total Bilirubin 0.2 - 1.2 mg/dL 0.6 - 1.1  Bilirubin, Direct 0.1 - 0.5 mg/dL - - -    Lab Results  Component Value Date/Time   TSH 1.25 09/03/2019 11:09 AM   TSH 0.92 05/04/2017 01:48 PM   FREET4 0.66 02/14/2015 08:45 AM    CBC Latest Ref Rng & Units 09/05/2019 01/06/2019 01/25/2018  WBC 3.8 - 10.8 Thousand/uL 8.7 10.2 9.1  Hemoglobin 11.7 - 15.5 g/dL 13.8 14.3 10.4(L)  Hematocrit 35.0 - 45.0 % 40.4 42.2 32.0(L)  Platelets 140 - 400 Thousand/uL 198 212 167    Lab Results  Component Value Date/Time   VD25OH 66.83 09/03/2019 11:09 AM   VD25OH 62.77 08/23/2018 01:27 PM    Clinical ASCVD: No  The 10-year ASCVD risk score Mikey Bussing DC Jr., et al., 2013) is: 39.6%   Values used to calculate the score:     Age: 54 years     Sex: Female     Is Non-Hispanic African American: No     Diabetic: Yes     Tobacco smoker: No     Systolic Blood Pressure: 852 mmHg     Is BP treated: Yes     HDL Cholesterol: 30 mg/dL     Total Cholesterol: 148 mg/dL    Depression screen [facility] 2/9 12/12/2019 11/11/2019 04/08/2019  Decreased Interest 3 1 3   Down, Depressed, Hopeless 3 1 3   PHQ - 2 Score 6 2 6   Altered sleeping - 0 3  Tired, decreased energy - 1 3  Change in appetite - 1 0  Feeling bad or failure about yourself  - 1 3  Trouble concentrating - 0 3  Moving slowly or fidgety/restless - - 2  Suicidal thoughts - 1 3  PHQ-9 Score - 6 23  Difficult doing work/chores - Somewhat difficult -  Some recent data might be hidden     Social History   Tobacco Use  Smoking Status Former Smoker  Smokeless Tobacco Never Used  Tobacco  Comment   smoke 1 cigarette every few weeks when the [place] lady came by.   BP Readings from Last 3 Encounters:  03/15/20 124/70  03/09/20 140/90  01/27/20 (!) 144/89   Pulse Readings from Last 3 Encounters:  03/15/20 70  03/09/20 72  01/27/20 67   Wt Readings from Last 3 Encounters:  03/15/20 206 lb 4 oz (93.6 kg)  03/09/20 204 lb 4 oz (92.6 kg)  01/27/20 203 lb (92.1 kg)    Assessment/Interventions: Review of patient past medical history, allergies, medications, health status, including review of consultants reports, laboratory and other test  data, was performed as part of comprehensive evaluation and provision of chronic care management services.   SDOH:  (Social Determinants of Health) assessments and interventions performed: No   CCM Care Plan  Allergies  Allergen Reactions  . Amoxil [Amoxicillin] Swelling and Other (See Comments)    Lips Has patient had a PCN reaction causing immediate rash, facial/tongue/throat swelling, SOB or lightheadedness with hypotension:YES Has patient had a PCN reaction causing severe rash involving mucus membranes or skin necrosis:No Has patient had a PCN reaction that required hospitalization:No Has patient had a PCN reaction occurring within the last 10 years:Yes. If all of the above answers are "NO", then may proceed with Cephalosporin use.   Marland Kitchen Lisinopril Itching and Swelling    SWELLING REACTION UNSPECIFIED   . Chocolate Hives  . Demerol Nausea And Vomiting  . Other Other (See Comments)    Raw food or nuts - GI pain    Medications Reviewed Today    Reviewed by [name], [name], MD (Physician) on 03/18/20 at 1405  Med List Status: <None>  Medication Order Taking? Sig Documenting Provider Last Dose Status Informant  acetaminophen (TYLENOL) 325 MG tablet [id number] Yes Take 325 mg by mouth every 6 (six) hours as needed. [provider] Taking Active Child  aspirin EC 81 MG tablet [id number] Yes Take 81 mg by mouth at bedtime.   [provider] Taking Active Family Member           Med Note Armandina Gemma, Edson Snowball May 05, 2019 10:27 AM)    bisacodyl (BISACODYL LAXATIVE) 10 MG suppository [id number] Yes Place 1 suppository (10 mg total) rectally as needed for moderate constipation. [name], [name], MD Taking Active   Calcium Carb-Cholecalciferol 600-200 MG-UNIT TABS [id number] Yes Take 1 tablet by mouth in the morning and at bedtime.  [provider] Taking Active Family Member  carbamide peroxide (DEBROX) 6.5 % OTIC solution [id number] Yes Place 5 drops into the left ear 2 (two) times daily. [name], [name], MD Taking Active            Med Note Willette Brace   Tue Nov 11, 2019  9:34 AM) As needed  Continuous Blood Gluc Receiver Benjie Karvonen G6 RECEIVER) DEVI [id number] Yes 1 Device by Does not apply route daily. Shamleffer, Melanie Crazier, MD Taking Active Family Member  Continuous Blood Gluc Sensor (DEXCOM G6 SENSOR) [place] [id number] Yes 1 kit by Does not apply route as directed. Shamleffer, Melanie Crazier, MD Taking Active Family Member  Continuous Blood Gluc Transmit (DEXCOM G6 TRANSMITTER) [place] [id number] Yes 1 kit by Does not apply route as directed. Shamleffer, Melanie Crazier, MD Taking Active Family Member  Cyanocobalamin (VITAMIN B 12 PO) [id number] Yes Take 1,000 mg by mouth daily. [provider] Taking Active   digoxin (LANOXIN) 0.125 MG tablet [id number] Yes TAKE 1 TABLET BY MOUTH DAILY. PLEASE KEEP UPCOMING APPT WITH DR. Marlou Porch IN SEPT FOR ANYMORE REFILLS Jerline Pain, MD Taking Active   docusate sodium (COLACE) 100 MG capsule [id number] Yes Take 100 mg by mouth daily. twice a day [provider] Taking Active Family Member  DULoxetine (CYMBALTA) 30 MG capsule [id number] Yes TAKE 2 CAPSULES BY MOUTH EVERY DAY Raulkar, Clide Deutscher, MD Taking Active   ezetimibe (ZETIA) 10 MG tablet [id number] Yes TAKE 1 TABLET BY MOUTH EVERY DAY [name], [name], MD Taking Active   glucose blood test  strip [id number] Yes Use as instructed to test blood sugars 3 times daily DX.E11.42  Shamleffer, Melanie Crazier, MD Taking Active Family Member  insulin aspart (NOVOLOG FLEXPEN) 100 UNIT/ML FlexPen [id number] Yes Inject 8 Units into the skin daily with breakfast AND 8 Units daily with lunch AND 10 Units daily with supper. Max daily 60 units per correction scale. Shamleffer, Melanie Crazier, MD Taking Active   insulin glargine (LANTUS SOLOSTAR) 100 UNIT/ML Solostar Pen [id number] Yes Inject 18 Units into the skin daily. Shamleffer, Melanie Crazier, MD Taking Active   Insulin Pen Needle (BD PEN NEEDLE MICRO U/F) 32G X 6 MM MISC [id number] Yes 4 TIMES A DAY NOVOLOG(3) AND LANTUS9(1) Shamleffer, Melanie Crazier, MD Taking Active   meloxicam (MOBIC) 15 MG tablet [id number] Yes TAKE 1 TABLET BY MOUTH EVERY DAY Raulkar, Clide Deutscher, MD Taking Active   metoprolol succinate (TOPROL-XL) 25 MG 24 hr tablet [id number] Yes TAKE 1 TABLET BY MOUTH DAILY. PLEASE KEEP UPCOMING APPT IN SEPT WITH DR. Marlou Porch FOR ANYMORE REFILLS Jerline Pain, MD Taking Active   mirabegron ER (MYRBETRIQ) 50 MG TB24 tablet [id number] Yes Take 1 tablet (50 mg total) by mouth daily. [name], [name], MD Taking Active   Multiple Vitamins-Minerals (PRESERVISION AREDS 2 PO) [id number] Yes Take 1 tablet by mouth 2 (two) times daily.  [provider] Taking Active Family Member  omega-3 acid ethyl esters (LOVAZA) 1 g capsule [id number] Yes TAKE 1 CAPSULE BY MOUTH 2 TIMES DAILY. [name], [name], MD Taking Active   omeprazole (PRILOSEC) 20 MG capsule [id number] Yes Take 1 capsule (20 mg total) by mouth daily. [name], [name], MD Taking Active Family Member        Discontinued 03/15/20 1449            Med Note Franchot Gallo   Wed Nov 12, 2019 10:28 AM) Not taken since last visit.  pregabalin (LYRICA) 50 MG capsule [id number] Yes Take 1 capsule (50 mg total) by mouth 2 (two) times daily. Izora Ribas, MD Taking Active   PREMARIN vaginal cream  [id number] Yes Place vaginally. [provider] Taking Active   ramipril (ALTACE) 10 MG capsule [id number] Yes TAKE 1 CAPSULE (10 MG TOTAL) BY MOUTH EVERY EVENING. [name], [name], MD Taking Active   RELION INSULIN SYRINGE 1ML/31G 31G X 5/16" 1 ML [place] [id number] Yes Use as directed [name], [name], MD Taking Active Family Member  rOPINIRole (REQUIP) 1 MG tablet [id number] Yes TAKE ONE TABLET BY MOUTH AT BEDTIME. [name], [name], MD Taking Active   rosuvastatin (CRESTOR) 10 MG tablet [id number] Yes Take 1 tablet (10 mg total) by mouth daily. [name], [name], MD Taking Active   verapamil (CALAN) 120 MG tablet [id number] Yes Take 120 mg by mouth daily as needed (afib).  [provider] Taking Active Family Member           Med Note Willette Brace   Tue Nov 11, 2019  9:30 AM) As needed  verapamil (VERELAN PM) 240 MG 24 hr capsule [id number] Yes TAKE 1 CAPSULE BY MOUTH EVERY DAY Tommie Raymond, NP Taking Active   VITAMIN D PO [id number] Yes Take 10,000 Units by mouth daily. 10,000 in the am and 5,000 in the pm [provider] Taking Active Family Member          Patient Active Problem List   Diagnosis Date Noted  . Aortic atherosclerosis ([place]) 09/20/2019  . Hyperlipidemia associated with type 2 diabetes mellitus ([place]) 09/17/2019  . Mild recurrent major depression ([place]) 09/17/2019  . Type 2 diabetes mellitus with diabetic  polyneuropathy, with long-term current use of insulin ([place]) 09/04/2019  . Fatigue 09/03/2019  . Dysuria 09/03/2019  . Peripheral neuropathy 02/28/2019  . Thoracic myelopathy 01/08/2019  . Cervical spine fracture ([place]) 01/22/2018  . Weakness 01/21/2018  . Closed C7 fracture ([place]) 01/21/2018  . Fall   . Lower urinary tract infectious disease   . Cervical spondylosis with myelopathy 11/12/2017  . Tinnitus of both ears 10/19/2017  . Rhinorrhea 09/26/2017  . Chronic diarrhea 08/08/2017  . S/P left TKA 04/10/2017  . Unstable gait 01/12/2017  .  Generalized osteoarthritis of hand 01/12/2017  . RLS (restless legs syndrome) 11/09/2016  . GERD (gastroesophageal reflux disease) 07/13/2016  . Morbid obesity ([place]) 06/01/2016  . Vitamin D deficiency 06/01/2016  . Vitamin B12 deficiency 03/27/2016  . Chronic cough 03/16/2016  . Left foot pain 03/16/2016  . Demand ischemia ([place]) 02/14/2015  . Degenerative drusen 07/13/2013  . Type 2 diabetes mellitus without ophthalmic manifestations ([place]) 07/13/2013  . DM (diabetes mellitus) type II controlled, neurological manifestation ([place]) 05/07/2012  . SVT (supraventricular tachycardia) ([place]) 05/07/2012  . Essential hypertension, benign 05/07/2012  . Fibromyalgia 05/07/2012    Immunization History  Administered Date(s) Administered  . Fluad Quad(high Dose 65+) 11/08/2018, 12/12/2019  . Influenza, High Dose Seasonal PF 11/10/2016, 12/27/2017  . PFIZER(Purple Top)SARS-COV-2 Vaccination 03/28/2019, 04/22/2019, 12/22/2019  . Pneumococcal Conjugate-13 08/13/2012  . Pneumococcal Polysaccharide-23 08/23/2007, 12/01/2019  . Tdap 11/13/2019  . Zoster Recombinat (Shingrix) 12/01/2019   Last surgery was in November and has been gaining strength in her legs and is working on balance - with PT  Conditions to be addressed/monitored:  Hypertension, Hyperlipidemia, Diabetes, GERD, Osteoarthritis, Overactive Bladder and bone health, paroxysmal atrial tachycardia  Care Plan : [place]  Updates made by Viona Gilmore, [place] since 04/23/2020 12:00 AM    Problem: Problem: Hypertension, Hyperlipidemia, Diabetes, Atrial Fibrillation, GERD and bone health     Long-Range Goal: Patient-Specific Goal   Start Date: 04/08/2020  Expected End Date: 04/08/2021  This Visit's Progress: On track  Priority: High  Note:   Current Barriers:  . Unable to independently monitor therapeutic efficacy . Unable to achieve control of cholesterol   Pharmacist Clinical Goal(s):  Marland Kitchen Over the next 120 days, patient  will achieve adherence to monitoring guidelines and medication adherence to achieve therapeutic efficacy . achieve control of cholesterol as evidenced by next lipid panel  through collaboration with PharmD and provider.   Interventions: . 1:1 collaboration with [name], [name], MD regarding development and update of comprehensive plan of care as evidenced by provider attestation and co-signature . Inter-disciplinary care team collaboration (see longitudinal plan of care) . Comprehensive medication review performed; medication list updated in electronic medical record  Hypertension (BP goal <140/90) -Controlled -Current treatment:  Metoprolol succinate (Toprol- XL) 73m, 1 tablet once daily - in AM   Ramipril 161m 1 capsule every evening   Verapamil 24072m1 capsule once daily - in AM -Medications previously tried: none  -Current home readings: 120/70, 126/80  -Current dietary habits: did not discuss -Current exercise habits: PT exercises -Denies hypotensive/hypertensive symptoms -Educated on Importance of home blood pressure monitoring; -Counseled to monitor BP at home weekly, document, and provide log at future appointments -Counseled on diet and exercise extensively Recommended to continue current medication  Hyperlipidemia: (LDL goal < 70) -Uncontrolled -Current treatment:  Ezetimibe 16m15m tablet once daily   Rosuvastatin 10 mg 1 tablet daily  Lovaza 1 g twice daily -Medications previously tried: none -Current dietary  patterns: patient limits fatty foods and is eating more greens and salads -Current exercise habits: PT exercises -Educated on Cholesterol goals;  Importance of limiting foods high in cholesterol; -Recommended switching Lovaza to Vascepa for better triglyceride lowering and ASCVD benefit  Diabetes (A1c goal <7%) -Not ideally controlled -Current medications: . Lantus inject 18 units daily . Novolog inject 8  units with breakfast and lunch and 10 units  with supper -Medications previously tried: n/a  -Current home glucose readings: uses Dexcom CGM -Denies hypoglycemic/hyperglycemic symptoms -Current meal patterns:  . breakfast: eggs and bacon . lunch: salad . dinner: n.a . snacks: n.a . drinks: n/a -Current exercise: PT exercises -Educated on Exercise goal of 150 minutes per week; Carbohydrate counting and/or plate method -Counseled to check feet daily and get yearly eye exams -Counseled on diet and exercise extensively Recommended to continue current medication  Paroxysmal atrial tachycardia (Goal: regulate heart rate) -Controlled -Current treatment:  Digoxin 0.161m, 1 tablet one daily   Metoprolol succinate (Toprol- XL) 264m 1 tablet once daily   Verapamil 12064m1 tablet as needed    Verapamil 240m28m capsule once daily  -Medications previously tried: n/a -Home BP and HR readings: did not provide  -Counseled on  monitoring HR along with BP -Recommended to continue current medication   Neuropathy (Goal: minimize symptoms) -Controlled -Current treatment  . Duloxetine 30 mg 1 capsule daily . Pregabalin 50 mg 1 capsule twice daily -Medications previously tried: none -Recommended to continue current medication  Urinary urgency (Goal: minimize symptoms) -Controlled -Current treatment  . Mirabegron 50 mg 1 tablet daily -Medications previously tried: none  -Recommended to continue current medication Patient has noticed a big difference with the higher dose  GERD (Goal: minimize symptoms of GERD/acid reflux) -Controlled -Current treatment  . Omeprazole 20 mg 1 capsule daily -Medications previously tried: none -Counseled on non-pharmacologic  management of symptoms such as elevating the head of your bed, avoiding eating 2-3 hours before bed, avoiding triggering foods such as acidic, spicy, or fatty foods, eating smaller meals, and wearing clothes that are loose around the waist    Osteoarthritis/pain (Goal:  minimize symptoms of pain) -Controlled -Current treatment  . Tylenol 500 mg 2 tablets before breakfast . Meloxicam 15 mg 1 tablet daily -Medications previously tried: Norco, Percocet  -Recommended to continue current medication  Health Maintenance -Vaccine gaps: 2nd shingles dose -Current therapy:  . Bisacodyl 10 mg as needed . Vitamin B12 1000 mcg 1 tablet daily . Docusate 100 mg 1 capsule twice daily . Preservision Areds 2 1 tablet twice daily . Aspirin 81 mg 1 tablet daily -Educated on Cost vs benefit of each product must be carefully weighed by individual consumer -Patient is satisfied with current therapy and denies issues -Recommended to continue current medication  Patient Goals/Self-Care Activities . Over the next 120 days, patient will:  - take medications as prescribed check glucose with CGM, document, and provide at future appointments check blood pressure weekly, document, and provide at future appointments  Follow Up Plan: Telephone follow up appointment with care management team member scheduled for:      Medication Assistance: None required.  Patient affirms current coverage meets needs.  Patient's preferred pharmacy is:  [id number], [place] - [id number]: [phone] pill box? Yes - daughter manages Pt endorses 100% compliance  We discussed: Benefits of medication synchronization, packaging and delivery as well  as enhanced pharmacist oversight with [employer]. Patient decided to: Continue current medication management strategy  Care Plan and Follow Up Patient Decision:  Patient agrees to Care Plan and Follow-up.  Plan: Telephone follow up appointment with care management team member scheduled for:  4 months  Jeni Salles, PharmD [place] Pharmacist [place] at [place] [phone]

## 2020-04-09 ENCOUNTER — Other Ambulatory Visit: Payer: Self-pay | Admitting: Family Medicine

## 2020-04-13 ENCOUNTER — Other Ambulatory Visit: Payer: Self-pay | Admitting: Physical Medicine and Rehabilitation

## 2020-04-13 DIAGNOSIS — R262 Difficulty in walking, not elsewhere classified: Secondary | ICD-10-CM | POA: Diagnosis not present

## 2020-04-13 DIAGNOSIS — M48062 Spinal stenosis, lumbar region with neurogenic claudication: Secondary | ICD-10-CM | POA: Diagnosis not present

## 2020-04-13 MED ORDER — PREGABALIN 50 MG PO CAPS
50.0000 mg | ORAL_CAPSULE | Freq: Two times a day (BID) | ORAL | 2 refills | Status: DC
Start: 2020-04-13 — End: 2020-04-26

## 2020-04-15 DIAGNOSIS — M48062 Spinal stenosis, lumbar region with neurogenic claudication: Secondary | ICD-10-CM | POA: Diagnosis not present

## 2020-04-15 DIAGNOSIS — R262 Difficulty in walking, not elsewhere classified: Secondary | ICD-10-CM | POA: Diagnosis not present

## 2020-04-23 ENCOUNTER — Other Ambulatory Visit: Payer: Self-pay | Admitting: Family Medicine

## 2020-04-23 DIAGNOSIS — M21372 Foot drop, left foot: Secondary | ICD-10-CM | POA: Diagnosis not present

## 2020-04-23 DIAGNOSIS — M1711 Unilateral primary osteoarthritis, right knee: Secondary | ICD-10-CM | POA: Diagnosis not present

## 2020-04-23 DIAGNOSIS — Z96652 Presence of left artificial knee joint: Secondary | ICD-10-CM | POA: Diagnosis not present

## 2020-04-23 MED ORDER — ICOSAPENT ETHYL 1 G PO CAPS: 2.0000 g | ORAL_CAPSULE | Freq: Two times a day (BID) | ORAL | 3 refills | Status: DC

## 2020-04-23 NOTE — Patient Instructions (Addendum)
Hi [name],  It was great to get to speak with you you over the telephone again! Below is a summary of some of the topics we discussed.   Please reach out to me if you have any questions or need anything before our follow up!  Best, Maddie  Jeni Salles, PharmD, [facility]    Visit Information  Goals Addressed   None    Patient Care Plan: [facility] Pharmacy Care Plan    Problem Identified: Problem: Hypertension, Hyperlipidemia, Diabetes, Atrial Fibrillation, GERD and bone health     Long-Range Goal: Patient-Specific Goal   Start Date: 04/08/2020  Expected End Date: 04/08/2021  This Visit's Progress: On track  Priority: High  Note:   Current Barriers:  . Unable to independently monitor therapeutic efficacy . Unable to achieve control of cholesterol   Pharmacist Clinical Goal(s):  Marland Kitchen Over the next 120 days, patient will achieve adherence to monitoring guidelines and medication adherence to achieve therapeutic efficacy . achieve control of cholesterol as evidenced by next lipid panel  through collaboration with PharmD and provider.   Interventions: . 1:1 collaboration with [name], [name], MD regarding development and update of comprehensive plan of care as evidenced by provider attestation and co-signature . Inter-disciplinary care team collaboration (see longitudinal plan of care) . Comprehensive medication review performed; medication list updated in electronic medical record  Hypertension (BP goal <140/90) -Controlled -Current treatment:  Metoprolol succinate (Toprol- XL) 25mg , 1 tablet once daily - in AM   Ramipril 10mg , 1 capsule every evening   Verapamil 240mg , 1 capsule once daily - in AM -Medications previously tried: none  -Current home readings: 120/70, 126/80  -Current dietary habits: did not discuss -Current exercise habits: PT exercises -Denies hypotensive/hypertensive symptoms -Educated on  Importance of home blood pressure monitoring; -Counseled to monitor BP at home weekly, document, and provide log at future appointments -Counseled on diet and exercise extensively Recommended to continue current medication  Hyperlipidemia: (LDL goal < 70) -Uncontrolled -Current treatment:  Ezetimibe 10mg , 1 tablet once daily   Rosuvastatin 10 mg 1 tablet daily  Lovaza 1 g twice daily -Medications previously tried: none -Current dietary patterns: patient limits fatty foods and is eating more greens and salads -Current exercise habits: PT exercises -Educated on Cholesterol goals;  Importance of limiting foods high in cholesterol; -Recommended switching Lovaza to Vascepa for better triglyceride lowering and ASCVD benefit  Diabetes (A1c goal <7%) -Not ideally controlled -Current medications: . Lantus inject 18 units daily . Novolog inject 8  units with breakfast and lunch and 10 units with supper -Medications previously tried: n/a  -Current home glucose readings: uses Dexcom CGM -Denies hypoglycemic/hyperglycemic symptoms -Current meal patterns:  . breakfast: eggs and bacon . lunch: salad . dinner: n.a . snacks: n.a . drinks: n/a -Current exercise: PT exercises -Educated on Exercise goal of 150 minutes per week; Carbohydrate counting and/or plate method -Counseled to check feet daily and get yearly eye exams -Counseled on diet and exercise extensively Recommended to continue current medication  Paroxysmal atrial tachycardia (Goal: regulate heart rate) -Controlled -Current treatment:  Digoxin 0.125mg , 1 tablet one daily   Metoprolol succinate (Toprol- XL) 25mg , 1 tablet once daily   Verapamil 120mg , 1 tablet as needed    Verapamil 240mg , 1 capsule once daily  -Medications previously tried: n/a -Home BP and HR readings: did not provide  -Counseled on  monitoring HR along with BP -Recommended to continue current medication   Neuropathy (Goal: minimize  symptoms) -Controlled -Current treatment  . Duloxetine 30 mg 1 capsule daily . Pregabalin 50 mg 1 capsule twice daily -Medications previously tried: none -Recommended to continue current medication  Urinary urgency (Goal: minimize symptoms) -Controlled -Current treatment  . Mirabegron 50 mg 1 tablet daily -Medications previously tried: none  -Recommended to continue current medication Patient has noticed a big difference with the higher dose  GERD (Goal: minimize symptoms of GERD/acid reflux) -Controlled -Current treatment  . Omeprazole 20 mg 1 capsule daily -Medications previously tried: none -Counseled on non-pharmacologic  management of symptoms such as elevating the head of your bed, avoiding eating 2-3 hours before bed, avoiding triggering foods such as acidic, spicy, or fatty foods, eating smaller meals, and wearing clothes that are loose around the waist    Osteoarthritis/pain (Goal: minimize symptoms of pain) -Controlled -Current treatment  . Tylenol 500 mg 2 tablets before breakfast . Meloxicam 15 mg 1 tablet daily -Medications previously tried: Norco, Percocet  -Recommended to continue current medication  Health Maintenance -Vaccine gaps: 2nd shingles dose -Current therapy:  . Bisacodyl 10 mg as needed . Vitamin B12 1000 mcg 1 tablet daily . Docusate 100 mg 1 capsule twice daily . Preservision Areds 2 1 tablet twice daily . Aspirin 81 mg 1 tablet daily -Educated on Cost vs benefit of each product must be carefully weighed by individual consumer -Patient is satisfied with current therapy and denies issues -Recommended to continue current medication  Patient Goals/Self-Care Activities . Over the next 120 days, patient will:  - take medications as prescribed check glucose with CGM, document, and provide at future appointments check blood pressure weekly, document, and provide at future appointments  Follow Up Plan: Telephone follow up appointment with care  management team member scheduled for:       Patient verbalizes understanding of instructions provided today and agrees to view in [place].  Telephone follow up appointment with pharmacy team member scheduled for:4 months  Viona Gilmore, [facility]  High Triglycerides Eating Plan Triglycerides are a type of fat in the blood. High levels of triglycerides can increase your risk of heart disease and stroke. If your triglyceride levels are high, choosing the right foods can help lower your triglycerides and keep your heart healthy. Work with your health care provider or a diet and nutrition specialist (dietitian) to develop an eating plan that is right for you. What are tips for following this plan? General guidelines  Lose weight, if you are overweight. For most people, losing 5-10 lbs (2-5 kg) helps lower triglyceride levels. A weight-loss plan may include. ? 30 minutes of exercise at least 5 days a week. ? Reducing the amount of calories, sugar, and fat you eat.  Eat a wide variety of fresh fruits, vegetables, and whole grains. These foods are high in fiber.  Eat foods that contain healthy fats, such as fatty fish, nuts, seeds, and olive oil.  Avoid foods that are high in added sugar, added salt (sodium), saturated fat, and trans fat.  Avoid low-fiber, refined carbohydrates such as white bread, crackers, noodles, and white rice.  Avoid foods with partially hydrogenated oils (trans fats), such as fried foods or stick margarine.  Limit alcohol intake to no more than 1 drink a day for nonpregnant women and 2 drinks a day for men. One drink equals 12 oz of beer, 5 oz of wine, or 1 oz of hard liquor. Your health care provider may recommend that you drink less depending on your overall health.  Reading food labels  Check food labels for the amount of saturated fat. Choose foods with no or very little saturated fat.  Check food labels for the amount of trans fat. Choose foods with no trans  fat.  Check food labels for the amount of cholesterol. Choose foods low in cholesterol. Ask your dietitian how much cholesterol you should have each day.  Check food labels for the amount of sodium. Choose foods with less than 140 milligrams (mg) per serving. Shopping  Buy dairy products labeled as nonfat (skim) or low-fat (1%).  Avoid buying processed or prepackaged foods. These are often high in added sugar, sodium, and fat. Cooking  Choose healthy fats when cooking, such as olive oil or canola oil.  Cook foods using lower fat methods, such as baking, broiling, boiling, or grilling.  Make your own sauces, dressings, and marinades when possible, instead of buying them. Store-bought sauces, dressings, and marinades are often high in sodium and sugar. Meal planning  Eat more home-cooked food and less restaurant, buffet, and fast food.  Eat fatty fish at least 2 times each week. Examples of fatty fish include salmon, trout, mackerel, tuna, and herring.  If you eat whole eggs, do not eat more than 3 egg yolks per week. What foods are recommended? The items listed may not be a complete list. Talk with your dietitian about what dietary choices are best for you. Grains Whole wheat or whole grain breads, crackers, cereals, and pasta. Unsweetened oatmeal. Bulgur. Barley. Quinoa. Brown rice. Whole wheat flour tortillas. Vegetables Fresh or frozen vegetables. Low-sodium canned vegetables. Fruits All fresh, canned (in natural juice), or frozen fruits. Meats and other protein foods Skinless chicken or [place]. Ground chicken or [place]. Lean cuts of pork, trimmed of fat. Fish and seafood, especially salmon, trout, and herring. Egg whites. Dried beans, peas, or lentils. Unsalted nuts or seeds. Unsalted canned beans. Natural peanut or almond butter. Dairy Low-fat dairy products. Skim or low-fat (1%) milk. Reduced fat (2%) and low-sodium cheese. Low-fat ricotta cheese. Low-fat cottage cheese.  Plain, low-fat yogurt. Fats and oils Tub margarine without trans fats. Light or reduced-fat mayonnaise. Light or reduced-fat salad dressings. Avocado. Safflower, olive, sunflower, soybean, and canola oils. What foods are not recommended? The items listed may not be a complete list. Talk with your dietitian about what dietary choices are best for you. Grains White bread. White (regular) pasta. White rice. Cornbread. Bagels. Pastries. Crackers that contain trans fat. Vegetables Creamed or fried vegetables. Vegetables in a cheese sauce. Fruits Sweetened dried fruit. Canned fruit in syrup. Fruit juice. Meats and other protein foods Fatty cuts of meat. Ribs. Chicken wings. Berniece Salines. Sausage. Bologna. Salami. Chitterlings. Fatback. Hot dogs. Bratwurst. Packaged lunch meats. Dairy Whole or reduced-fat (2%) milk. Half-and-half. Cream cheese. Full-fat or sweetened yogurt. Full-fat cheese. Nondairy creamers. Whipped toppings. Processed cheese or cheese spreads. Cheese curds. Beverages Alcohol. Sweetened drinks, such as soda, lemonade, fruit drinks, or punches. Fats and oils Butter. Stick margarine. Lard. Shortening. Ghee. Bacon fat. Tropical oils, such as coconut, palm kernel, or palm oils. Sweets and desserts Corn syrup. Sugars. Honey. Molasses. Candy. Jam and jelly. Syrup. Sweetened cereals. Cookies. Pies. Cakes. Donuts. Muffins. Ice cream. Condiments Store-bought sauces, dressings, and marinades that are high in sugar, such as ketchup and barbecue sauce. Summary  High levels of triglycerides can increase the risk of heart disease and stroke. Choosing the right foods can help lower your triglycerides.  Eat plenty of fresh fruits, vegetables, and whole grains. Choose low-fat dairy  and lean meats. Eat fatty fish at least twice a week.  Avoid processed and prepackaged foods with added sugar, sodium, saturated fat, and trans fat.  If you need suggestions or have questions about what types of food are  good for you, talk with your health care provider or a dietitian. This information is not intended to replace advice given to you by your health care provider. Make sure you discuss any questions you have with your health care provider. Document Revised: 06/04/2019 Document Reviewed: 06/04/2019 Elsevier Patient Education  2021 [employer].

## 2020-04-26 ENCOUNTER — Other Ambulatory Visit: Payer: Self-pay

## 2020-04-26 ENCOUNTER — Encounter: Payer: Self-pay | Admitting: Physical Medicine and Rehabilitation

## 2020-04-26 ENCOUNTER — Encounter: Payer: PPO | Attending: Physical Medicine and Rehabilitation | Admitting: Physical Medicine and Rehabilitation

## 2020-04-26 ENCOUNTER — Telehealth: Payer: Self-pay | Admitting: *Deleted

## 2020-04-26 VITALS — BP 150/84 | HR 70 | Temp 98.0°F | Ht 64.0 in | Wt 202.0 lb

## 2020-04-26 DIAGNOSIS — M1711 Unilateral primary osteoarthritis, right knee: Secondary | ICD-10-CM | POA: Insufficient documentation

## 2020-04-26 DIAGNOSIS — G629 Polyneuropathy, unspecified: Secondary | ICD-10-CM | POA: Insufficient documentation

## 2020-04-26 DIAGNOSIS — M797 Fibromyalgia: Secondary | ICD-10-CM | POA: Diagnosis not present

## 2020-04-26 MED ORDER — PREGABALIN 50 MG PO CAPS: 50.0000 mg | ORAL_CAPSULE | Freq: Three times a day (TID) | ORAL | 0 refills | Status: DC

## 2020-04-26 NOTE — Progress Notes (Signed)
Subjective:    Patient ID: [name], female    DOB: 04-24-1940, 80 y.o.   MRN: [id number]  HPI  [name] is a 80 year old woman who presents for follow-up of fibromyalgia and primary right knee OA:  1) Fibromyalgia: -I spoke with daughter prior to this appointment and she mentioned that Lyrica has been helping. -[name] asks if Lyrica dose may be increased. She is currently taking 50mg  three times per day.  -She is not experiencing negative side effects from Lyrica -her pain levels have been high recently.  2) Primary OA of right knee: -She has been wearing a brace for her left knee and this has really helped. She received it from Dr. Alvan Dame at [facility]. -She would like a similar brace for her right knee -She has noted buckling when she walks -She would like to avoid surgery if possible. -She has been icing during the day  Prior history: [name] is a 80 year old woman who presents for follow-up of pain in her right anterior thigh as well as right hand flexor spasticity. She takes 2 tylenol in the morning and it takes about an hour and half for this to work. It usually lasts her until the ned of the day. She has also been having neuropathy which causes her to lose her balance. She asks me about a device that she saw on TV. She also plans to start physical therapy in January. She had an exacerbation of her pain since last visit and saw Dr. [name] and had a repeat MRI- there was nothing acute that warranted surgery. During this flare she took narcotics and did not eat much. During that time she was more constipated.   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 Her daughter accompanies her today.   She has a history of severe cervical, thoracic, and lumbar spinal stenosis s/p two ACDFs and thoracic decompression, fusion, and posterior lateral arthrodesis  with pedicle screws.   She has been having severe right sided flexor spasticity since then causing her fingers to curve inward to her palms. She has been using hand splints and exercising her hands to keep them mobile. This is very disruptive to her as she would love to be able to return to cooking and knitting, activities that she loves. She does not have other areas of spasticity. I have administered Botox to her right hand twice- the first time there was minimal benefit and the second time it made her arm weaker which lessened her ability to hold her walker. She prefers not to try the Botox again. She has been able to open her hand more but she has no functional improvement.  She ambulates using a walker and has had no recent falls. She is currently receiving PT for her right handed tone.   Her pain is neuropathic in nature and present in both hands and feet. She has uncontrolled diabetes and her insulin dose was recently increased.   She also has fibromyalgia and takes Cymbalta 30mg  which helps. I had started her on Lyrica as well and this did provide relief but she did not like that it made her sleepy during the day. She would like to increase dose to 25mg  BID and currently has enough medication.   She has been continuing to take Meloxicam 15mg  daily.   Her right knee pain has recently flared.  She has swelling but no erythema. She has received steroid injections for arthritis in the past. She would be interested in viscosupplementation.   She has been urinating very frequently at night and this is very bothersome to her. She is on Myrbetriq. She has not seen a urologist recently. In the past she saw a urologist at [facility] and had a good experience there. Would like to return there. Has never had urodynamic studies in the past.   Pain Inventory Average Pain 7 Pain Right Now 10 My pain is aching  In the last 24 hours, has pain interfered with the following? General  activity 10 Relation with others 9 Enjoyment of life 10 What TIME of day is your pain at its worst? All the time. Sleep (in general) Good  Pain is worse with: walking, bending and standing Pain improves with: rest, heat/ice and medication Relief from Meds: 5      Family History  Problem Relation Age of Onset  . Heart failure Mother   . Hyperlipidemia Mother   . Lung cancer Father        Lymphoma  . Hyperlipidemia Maternal Grandmother   . Peripheral vascular disease Maternal Grandmother    Social History   Socioeconomic History  . Marital status: Widowed    Spouse name: [name]  . Number of children: 2  . Years of education: Not on file  . Highest education level: Not on file  Occupational History  . Occupation: Retired  Tobacco Use  . Smoking status: Former Research scientist (life sciences)  . Smokeless tobacco: Never Used  . Tobacco comment: smoke 1 cigarette every few weeks when the [place] lady came by.  Vaping Use  . Vaping Use: Never used  Substance and Sexual Activity  . Alcohol use: No  . Drug use: No  . Sexual activity: Not on file  Other Topics Concern  . Not on file  Social History Narrative   Lives home alone.  Daughter [name] with her today.   Independent of ADLs.  Education HS.  Widowed.     Social Determinants of Health   Financial Resource Strain: Low Risk   . Difficulty of Paying Living Expenses: Not hard at all  Food Insecurity: No Food Insecurity  . Worried About Charity fundraiser in the Last Year: Never true  . Ran Out of Food in the Last Year: Never true  Transportation Needs: No Transportation Needs  . Lack of Transportation (Medical): No  . Lack of Transportation (Non-Medical): No  Physical Activity: Inactive  . Days of Exercise per Week: 0 days  . Minutes of Exercise per Session: 0 min  Stress: Stress Concern Present  . Feeling of Stress : Rather much  Social Connections: Moderately Isolated  . Frequency of Communication with Friends and Family: More than  three times a week  . Frequency of Social Gatherings with Friends and Family: Never  . Attends Religious Services: 1 to 4 times per year  . Active Member of Clubs or Organizations: No  . Attends Archivist Meetings: Never  . Marital Status: Widowed   Past Surgical History:  Procedure Laterality Date  . ABDOMINAL HYSTERECTOMY    . ANKLE SURGERY Left    x 2  . ANTERIOR CERVICAL DECOMP/DISCECTOMY FUSION N/A 11/12/2017   Procedure: Cervical seven to Thoracic one  Anterior cervical decompression/discectomy/fusion with removal of old plate;  Surgeon: [name], MD;  Location: [facility];  Service: Neurosurgery;  Laterality: N/A;  . BACK SURGERY  T12-L1 fusion, Anterior Cervical fusion  . CARDIAC CATHETERIZATION N/A 02/16/2015   Procedure: Left Heart Cath and Coronary Angiography;  Surgeon: Sherren Mocha, MD;  Location: [facility];  Service: Cardiovascular;  Laterality: N/A;  . CHOLECYSTECTOMY    . COLONOSCOPY    . ESOPHAGOGASTRODUODENOSCOPY  12/21/2010   Procedure: ESOPHAGOGASTRODUODENOSCOPY (EGD);  Surgeon: Landry Dyke, MD;  Location: [facility];  Service: Endoscopy;  Laterality: N/A;  . EXCISION MORTON'S NEUROMA Right   . EYE SURGERY Bilateral    Cataract  . FRACTURE SURGERY     ankle x 2   . KNEE SURGERY Right    x 3- arthroscopy  . NECK SURGERY    . POSTERIOR CERVICAL FUSION/FORAMINOTOMY N/A 01/22/2018   Procedure: Cervical six-seven Posterior Decompression with Posterior Fixation Cervical Five to Thoracic Two;  Surgeon: [name], MD;  Location: [facility];  Service: Neurosurgery;  Laterality: N/A;  Cervical 6-7 Posteror decompression with posterior fixation Cervical 5 to Thoracic 2  . ROTATOR CUFF REPAIR Right   . TONSILLECTOMY    . TOTAL KNEE ARTHROPLASTY Left 04/10/2017   Procedure: LEFT TOTAL KNEE ARTHROPLASTY;  Surgeon: Paralee Cancel, MD;  Location: [facility];  Service: Orthopedics;  Laterality: Left;  70 mins  . UPPER GASTROINTESTINAL ENDOSCOPY    . WRIST  SURGERY Left    fracture   Past Medical History:  Diagnosis Date  . Anemia    during 1st pregnancy  . Anxiety   . Arthritis   . Cancer ([place])    Basal - face  . Complication of anesthesia   . Depression   . Diarrhea, functional   . Dysrhythmia    Paroysmal Atrial Tachycardia  . Essential hypertension   . Fibromyalgia   . GERD (gastroesophageal reflux disease)   . H/O acute pancreatitis   . Headache   . History of hiatal hernia   . Hyperlipemia   . Neuropathy   . Osteoporosis   . Panic attacks   . 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 PAT (paroxysmal atrial tachycardia) ([place])   . Pneumonia   . PONV (postoperative nausea and vomiting)   . PSVT (paroxysmal supraventricular tachycardia) ([place])   . Reflux   . Restless leg syndrome   . Spinal cord compression ([place])   . Type 2 diabetes mellitus ([facility])    Type II  . Vertigo    not current   There were no vitals taken for this visit.  Opioid Risk Score:   Fall Risk Score:  `1  Depression screen PHQ 2/9  Depression screen [facility] 2/9 12/12/2019 11/11/2019 04/08/2019 09/28/2018 08/23/2018 10/19/2017 09/05/2017  Decreased Interest 3 1 3  0 0 1 0  Down, Depressed, Hopeless 3 1 3 1  0 2 0  PHQ - 2 Score 6 2 6 1  0 3 0  Altered sleeping - 0 3 - - 2 -  Tired, decreased energy - 1 3 - - 3 -  Change in appetite - 1 0 - - 3 -  Feeling bad or failure about yourself  - 1 3 - - 3 -  Trouble concentrating - 0 3 - - 2 -  Moving slowly or fidgety/restless - - 2 - - 1 -  Suicidal thoughts - 1 3 - - 3 -  PHQ-9 Score - 6 23 - - 20 -  Difficult doing work/chores - Somewhat difficult - - - Not difficult at all -  Some recent data might be hidden     Review of Systems  Constitutional: Negative.   HENT: Negative.   Eyes: Negative.   Respiratory: Negative.   Cardiovascular: Negative.   Gastrointestinal: Positive for constipation.  Endocrine: Negative.   Genitourinary: Positive for frequency, urgency and vaginal pain.  Musculoskeletal: Positive  for gait problem and joint swelling.  Skin: Negative.   Allergic/Immunologic: Negative.   Neurological: Positive for weakness and numbness.  Psychiatric/Behavioral: Negative.   All other systems reviewed and are negative.      Objective:   Physical Exam Gen: no distress, normal appearing HEENT: oral mucosa pink and moist, NCAT Cardio: Reg rate Chest: normal effort, normal rate of breathing Abd: soft, non-distended Ext: no edema Psych: pleasant, normal affect Skin: intact Neuro: Alert and oriented x3. Musculoskeletal:Sensation is decreased left foot. Ambulates with antalgic gait with RW. Right third and 4th digit extension has improved but strength is still decreased. Psych: pleasant, normal affect    Assessment & Plan:  Leah. 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    Spasticity: continue daily stretching.   -Continue use of splint at night and exercises to maintain range of motion in right hand.   Fibromyalgia and diabetic peripheral neuropathy -Continue Cymbalta 30mg   -Increase Lyrica to 50mg  TID, provided refill.  -Recommended intermittent fasting to improve insulin sensitivity. Currently eats breakfast at 8am and dinner at 5:30pm. Recommended pushing breakfast to 8:15am -Recommended no added sugars and minimal bread, pasta, rice.  -Continue physical therapy in January for balance deficits  Nocturia:  -Provided referral to urology for urodynamic studies to determine etiology of nocturia.   Constipation: -Constipation symptoms worsened with narcotic use. She has been using dulcolax suppository as needed and she has been comfortable with this, may continue this.  -Also recommended high fiber diet.   Right knee pain 2/2 OA: Viscosupplementation offered no benefits.  -Discussed risks and benefits of  surgery -Recommended blue emu oil -Discussed current symptoms of pain and history of pain.  -Discussed benefits of exercise in reducing pain. -Sent referral for right knee brace to [facility] with clinic notes.  -Discussed following foods that may reduce pain: 1) Ginger 2) Blueberries 3) Salmon 4) Pumpkin seeds 5) dark chocolate 6) turmeric 7) tart cherries 8) virgin olive oil 9) chilli peppers 10) mint 11) red wine 12) garlic  Link to further information on diet for chronic pain: [email or web address]  Left foot becomes purplish, tingling: -referred to vascular for eval for PVD

## 2020-04-26 NOTE — Telephone Encounter (Signed)
Rx faxed to [facility] for right leg brace

## 2020-04-26 NOTE — Patient Instructions (Signed)
Ice 15 minutes three times day.   Blue emu oil three times per day.   Lyrica 50mg  three times per day  Continue therapy  -Discussed following foods that may reduce pain: Eat each at least once per week.  1) Ginger 2) Blueberries 3) Salmon 4) Pumpkin seeds 5) dark chocolate 6) turmeric 7) tart cherries 8) virgin olive oil 9) chilli peppers 10) mint 11) red wine 12) garlic  Link to further information on diet for chronic pain: [email or web address]

## 2020-04-27 ENCOUNTER — Other Ambulatory Visit: Payer: Self-pay | Admitting: Physical Medicine and Rehabilitation

## 2020-04-28 DIAGNOSIS — E1149 Type 2 diabetes mellitus with other diabetic neurological complication: Secondary | ICD-10-CM | POA: Diagnosis not present

## 2020-04-30 ENCOUNTER — Telehealth: Payer: Self-pay | Admitting: *Deleted

## 2020-04-30 NOTE — Telephone Encounter (Signed)
Was sent to [facility]!

## 2020-04-30 NOTE — Telephone Encounter (Signed)
Mrs Buhl called to ask where the referral for her knee brace went so she will know where to pick it up. Please advise.

## 2020-04-30 NOTE — Telephone Encounter (Signed)
Notified Mrs Wiens.

## 2020-05-05 ENCOUNTER — Other Ambulatory Visit: Payer: Self-pay | Admitting: Cardiology

## 2020-05-20 ENCOUNTER — Other Ambulatory Visit: Payer: Self-pay

## 2020-05-20 DIAGNOSIS — I739 Peripheral vascular disease, unspecified: Secondary | ICD-10-CM

## 2020-05-27 ENCOUNTER — Other Ambulatory Visit: Payer: Self-pay | Admitting: *Deleted

## 2020-05-27 MED ORDER — MELOXICAM 15 MG PO TABS: 1.0000 | ORAL_TABLET | Freq: Every day | ORAL | 0 refills | Status: DC

## 2020-05-28 DIAGNOSIS — E1149 Type 2 diabetes mellitus with other diabetic neurological complication: Secondary | ICD-10-CM | POA: Diagnosis not present

## 2020-05-31 ENCOUNTER — Other Ambulatory Visit: Payer: Self-pay | Admitting: Internal Medicine

## 2020-06-02 ENCOUNTER — Other Ambulatory Visit: Payer: Self-pay | Admitting: *Deleted

## 2020-06-02 MED ORDER — PREGABALIN 50 MG PO CAPS: 50.0000 mg | ORAL_CAPSULE | Freq: Three times a day (TID) | ORAL | 1 refills | Status: DC

## 2020-06-04 DIAGNOSIS — M1711 Unilateral primary osteoarthritis, right knee: Secondary | ICD-10-CM | POA: Diagnosis not present

## 2020-06-04 DIAGNOSIS — M21372 Foot drop, left foot: Secondary | ICD-10-CM | POA: Diagnosis not present

## 2020-06-04 DIAGNOSIS — M25562 Pain in left knee: Secondary | ICD-10-CM | POA: Diagnosis not present

## 2020-06-07 DIAGNOSIS — M7742 Metatarsalgia, left foot: Secondary | ICD-10-CM | POA: Diagnosis not present

## 2020-06-07 DIAGNOSIS — M79672 Pain in left foot: Secondary | ICD-10-CM | POA: Diagnosis not present

## 2020-06-07 DIAGNOSIS — M6702 Short Achilles tendon (acquired), left ankle: Secondary | ICD-10-CM | POA: Diagnosis not present

## 2020-06-07 DIAGNOSIS — M25572 Pain in left ankle and joints of left foot: Secondary | ICD-10-CM | POA: Diagnosis not present

## 2020-06-08 ENCOUNTER — Encounter (HOSPITAL_COMMUNITY): Payer: PPO

## 2020-06-08 ENCOUNTER — Encounter: Payer: PPO | Admitting: Vascular Surgery

## 2020-06-15 ENCOUNTER — Other Ambulatory Visit: Payer: Self-pay

## 2020-06-15 ENCOUNTER — Encounter: Payer: Self-pay | Admitting: Physical Medicine and Rehabilitation

## 2020-06-15 ENCOUNTER — Encounter: Payer: PPO | Attending: Physical Medicine and Rehabilitation | Admitting: Physical Medicine and Rehabilitation

## 2020-06-15 VITALS — HR 71 | Temp 98.3°F | Ht 64.0 in | Wt 202.0 lb

## 2020-06-15 DIAGNOSIS — M1711 Unilateral primary osteoarthritis, right knee: Secondary | ICD-10-CM | POA: Diagnosis not present

## 2020-06-15 DIAGNOSIS — M7501 Adhesive capsulitis of right shoulder: Secondary | ICD-10-CM | POA: Diagnosis not present

## 2020-06-15 DIAGNOSIS — M797 Fibromyalgia: Secondary | ICD-10-CM | POA: Insufficient documentation

## 2020-06-15 NOTE — Patient Instructions (Addendum)
Naltrexone- low dose   -Discussed following foods that may reduce pain: 1) Ginger (especially studied for arthritis)- reduce leukotriene production to decrease inflammation 2) Blueberries- high in phytonutrients that decrease inflammation 3) Salmon- marine omega-3s reduce joint swelling and pain 4) Pumpkin seeds- reduce inflammation 5) dark chocolate- reduces inflammation 6) turmeric- reduces inflammation 7) tart cherries - reduce pain and stiffness 8) extra virgin olive oil - its compound olecanthal helps to block prostaglandins  9) chili peppers- can be eaten or applied topically via capsaicin 10) mint- helpful for headache, muscle aches, joint pain, and itching 11) garlic- reduces inflammation  Constipation:  -Provided list of following foods that help with constipation and highlighted a few: 1) prunes- contain high amounts of fiber.  2) apples- has a form of dietary fiber called pectin that accelerates stool movement and increases beneficial gut bacteria 3) pears- in addition to fiber, also high in fructose and sorbitol which have laxative effect 4) figs- contain an enzyme ficin which helps to speed colonic transit 5) kiwis- contain an enzyme actinidin that improves gut motility and reduces constipation 6) oranges- rich in pectin (like apples) 7) grapefruits- contain a flavanol naringenin which has a laxative effect 8) vegetables- rich in fiber and also great sources of folate, vitamin C, and K 9) artichoke- high in inulin, prebiotic great for the microbiome 10) chicory- increases stool frequency and softness (can be added to coffee) 11) rhubarb- laxative effect 12) sweet potato- high fiber 13) beans, peas, and lentils- contain both soluble and insoluble fiber 14) chia seeds- improves intestinal health and gut flora 15) flaxseeds- laxative effect 16) whole grain rye bread- high in fiber 17) oat bran- high in soluble and insoluble fiber 18) kefir- softens stools -recommended to  try at least one of these foods every day.  -drink 6-8 glasses of water per day -walk regularly, especially after meals.

## 2020-06-15 NOTE — Progress Notes (Signed)
Subjective:    Patient ID: [name], female    DOB: 16-Nov-1940, 80 y.o.   MRN: [id number]  HPI  [name] is a 80 year old woman who presents for follow-up of fibromyalgia and primary right knee OA:  1) Fibromyalgia: -I spoke with daughter prior to this appointment and she mentioned that Lyrica has been helping -last refilled 4/20.  -Leah. Aarielle [name] asks if Lyrica dose may be increased. She is currently taking 50mg  three times per day.  -She is not experiencing negative side effects from Lyrica -her pain levels have been high recently.   2) Primary OA of right knee: -She has been wearing a brace for her left knee and this has really helped. She received it from Dr. Alvan Dame at [facility]. -She would like a similar brace for her right knee -She has noted buckling when she walks -She would like to avoid surgery if possible. -she takes 2 tylenol in the morning and this provides relief after about 30 minutes -Has not been tested for RF recently it appears -has been to rheumatology and felt they were in a hurry. -Pain is worst in the morning when she gets up.  -does not want rheumatology referral.  -had steroid injection Friday without benefit.    Prior history: Leah. [name] is a 80 year old woman who presents for follow-up of pain in her right anterior thigh as well as right hand flexor spasticity. She takes 2 tylenol in the morning and it takes about an hour and half for this to work. It usually lasts her until the ned of the day. She has also been having neuropathy which causes her to lose her balance. She asks me about a device that she saw on TV. She also plans to start physical therapy in January. She had an exacerbation of her pain since last visit and saw Dr. [name] and had a repeat MRI- there was nothing acute that warranted surgery. During this flare she took narcotics and did not eat much. During that time she was more constipated.   Prior history [name] is a  80 year old woman who presents for follow-up of right hand flexor spasticity following a fall resulting in spinal cord damage. Her daughter accompanies her today.   She has a history of severe cervical, thoracic, and lumbar spinal stenosis s/p two ACDFs and thoracic decompression, fusion, and posterior lateral arthrodesis with pedicle screws.   She has been having severe right sided flexor spasticity since then causing her fingers to curve inward to her palms. She has been using hand splints and exercising her hands to keep them mobile. This is very disruptive to her as she would love to be able to return to cooking and knitting, activities that she loves. She does not have other areas of spasticity. I have administered Botox to her right hand twice- the first time there was minimal benefit and the second time it made her arm weaker which lessened her ability to hold her walker. She prefers not to try the Botox again. She has been able to open her hand more but she has no functional improvement.  She ambulates using a walker and has had no recent falls. She is currently receiving PT for her right handed tone.   Her pain is neuropathic in nature and present in both hands and feet. She has uncontrolled diabetes and her insulin dose was recently increased.   She also has fibromyalgia and takes Cymbalta 30mg  which helps. I had  started her on Lyrica as well and this did provide relief but she did not like that it made her sleepy during the day. She would like to increase dose to 25mg  BID and currently has enough medication.   She has been continuing to take Meloxicam 15mg  daily.   Her right knee pain has recently flared. She has swelling but no erythema. She has received steroid injections for arthritis in the past. She would be interested in viscosupplementation.   She has been urinating very frequently at night and this is very bothersome to her. She is on Myrbetriq. She has not seen a urologist  recently. In the past she saw a urologist at [facility] and had a good experience there. Would like to return there. Has never had urodynamic studies in the past.   Pain Inventory Average Pain 7 Pain Right Now 1 My pain is intermittent, burning and aching  In the last 24 hours, has pain interfered with the following? General activity 5 Relation with others 1 Enjoyment of life 0 What TIME of day is your pain at its worst? morning Sleep (in general) Good  Pain is worse with: walking, bending, standing and some activites Pain improves with: rest and medication Relief from Meds: 7      Family History  Problem Relation Age of Onset  . Heart failure Mother   . Hyperlipidemia Mother   . Lung cancer Father        Lymphoma  . Hyperlipidemia Maternal Grandmother   . Peripheral vascular disease Maternal Grandmother    Social History   Socioeconomic History  . Marital status: Widowed    Spouse name: Glendell Docker  . Number of children: 2  . Years of education: Not on file  . Highest education level: Not on file  Occupational History  . Occupation: Retired  Tobacco Use  . Smoking status: Former Research scientist (life sciences)  . Smokeless tobacco: Never Used  . Tobacco comment: smoke 1 cigarette every few weeks when the [place] lady came by.  Vaping Use  . Vaping Use: Never used  Substance and Sexual Activity  . Alcohol use: No  . Drug use: No  . Sexual activity: Not on file  Other Topics Concern  . Not on file  Social History Narrative   Lives home alone.  Daughter [name] with her today.   Independent of ADLs.  Education HS.  Widowed.     Social Determinants of Health   Financial Resource Strain: Low Risk   . Difficulty of Paying Living Expenses: Not hard at all  Food Insecurity: No Food Insecurity  . Worried About Charity fundraiser in the Last Year: Never true  . Ran Out of Food in the Last Year: Never true  Transportation Needs: No Transportation Needs  . Lack of Transportation  (Medical): No  . Lack of Transportation (Non-Medical): No  Physical Activity: Inactive  . Days of Exercise per Week: 0 days  . Minutes of Exercise per Session: 0 min  Stress: Stress Concern Present  . Feeling of Stress : Rather much  Social Connections: Moderately Isolated  . Frequency of Communication with Friends and Family: More than three times a week  . Frequency of Social Gatherings with Friends and Family: Never  . Attends Religious Services: 1 to 4 times per year  . Active Member of Clubs or Organizations: No  . Attends Archivist Meetings: Never  . Marital Status: Widowed   Past Surgical History:  Procedure Laterality Date  .  ABDOMINAL HYSTERECTOMY    . ANKLE SURGERY Left    x 2  . ANTERIOR CERVICAL DECOMP/DISCECTOMY FUSION N/A 11/12/2017   Procedure: Cervical seven to Thoracic one  Anterior cervical decompression/discectomy/fusion with removal of old plate;  Surgeon: [name], MD;  Location: [facility];  Service: Neurosurgery;  Laterality: N/A;  . BACK SURGERY     T12-L1 fusion, Anterior Cervical fusion  . CARDIAC CATHETERIZATION N/A 02/16/2015   Procedure: Left Heart Cath and Coronary Angiography;  Surgeon: Sherren Mocha, MD;  Location: [facility];  Service: Cardiovascular;  Laterality: N/A;  . CHOLECYSTECTOMY    . COLONOSCOPY    . ESOPHAGOGASTRODUODENOSCOPY  12/21/2010   Procedure: ESOPHAGOGASTRODUODENOSCOPY (EGD);  Surgeon: Landry Dyke, MD;  Location: [facility];  Service: Endoscopy;  Laterality: N/A;  . EXCISION MORTON'S NEUROMA Right   . EYE SURGERY Bilateral    Cataract  . FRACTURE SURGERY     ankle x 2   . KNEE SURGERY Right    x 3- arthroscopy  . NECK SURGERY    . POSTERIOR CERVICAL FUSION/FORAMINOTOMY N/A 01/22/2018   Procedure: Cervical six-seven Posterior Decompression with Posterior Fixation Cervical Five to Thoracic Two;  Surgeon: [name], MD;  Location: [facility];  Service: Neurosurgery;  Laterality: N/A;  Cervical 6-7 Posteror  decompression with posterior fixation Cervical 5 to Thoracic 2  . ROTATOR CUFF REPAIR Right   . TONSILLECTOMY    . TOTAL KNEE ARTHROPLASTY Left 04/10/2017   Procedure: LEFT TOTAL KNEE ARTHROPLASTY;  Surgeon: Paralee Cancel, MD;  Location: [facility];  Service: Orthopedics;  Laterality: Left;  70 mins  . UPPER GASTROINTESTINAL ENDOSCOPY    . WRIST SURGERY Left    fracture   Past Medical History:  Diagnosis Date  . Anemia    during 1st pregnancy  . Anxiety   . Arthritis   . Cancer ([place])    Basal - face  . Complication of anesthesia   . Depression   . Diarrhea, functional   . Dysrhythmia    Paroysmal Atrial Tachycardia  . Essential hypertension   . Fibromyalgia   . GERD (gastroesophageal reflux disease)   . H/O acute pancreatitis   . Headache   . History of hiatal hernia   . Hyperlipemia   . Neuropathy   . Osteoporosis   . Panic attacks   . Paralysis ([place]) 01/06/2019   hands  . PAT (paroxysmal atrial tachycardia) ([place])   . Pneumonia   . PONV (postoperative nausea and vomiting)   . PSVT (paroxysmal supraventricular tachycardia) ([place])   . Reflux   . Restless leg syndrome   . Spinal cord compression ([place])   . Type 2 diabetes mellitus ([facility])    Type II  . Vertigo    not current   There were no vitals taken for this visit.  Opioid Risk Score:   Fall Risk Score:  `1  Depression screen PHQ 2/9  Depression screen [facility] 2/9 12/12/2019 11/11/2019 04/08/2019 09/28/2018 08/23/2018 10/19/2017 09/05/2017  Decreased Interest 3 1 3  0 0 1 0  Down, Depressed, Hopeless 3 1 3 1  0 2 0  PHQ - 2 Score 6 2 6 1  0 3 0  Altered sleeping - 0 3 - - 2 -  Tired, decreased energy - 1 3 - - 3 -  Change in appetite - 1 0 - - 3 -  Feeling bad or failure about yourself  - 1 3 - - 3 -  Trouble concentrating - 0 3 - - 2 -  Moving slowly or fidgety/restless - - 2 - - 1 -  Suicidal thoughts - 1 3 - - 3 -  PHQ-9 Score - 6 23 - - 20 -  Difficult doing work/chores - Somewhat difficult - - - Not difficult at all -   Some recent data might be hidden     Review of Systems  Constitutional: Negative.   HENT: Negative.   Eyes: Negative.   Respiratory: Negative.   Cardiovascular: Negative.   Gastrointestinal: Positive for constipation.  Endocrine: Negative.   Genitourinary: Positive for frequency, urgency and vaginal pain.  Musculoskeletal: Positive for gait problem and joint swelling.  Skin: Negative.   Allergic/Immunologic: Negative.   Neurological: Positive for weakness and numbness.  Psychiatric/Behavioral: Negative.   All other systems reviewed and are negative.      Objective:   Physical Exam Gen: no distress, normal appearing HEENT: oral mucosa pink and moist, NCAT Cardio: Reg rate Chest: normal effort, normal rate of breathing Abd: soft, non-distended Ext: no edema Psych: pleasant, normal affect Skin: intact Neuro: Alert and oriented x3. Musculoskeletal:Sensation is decreased left foot. Ambulates with antalgic gait with RW. Right third and 4th digit extension has improved but strength is still decreased. Bilateral knees in braces.  Psych: pleasant, normal affect    Assessment & Plan:  Leah. Syler is a 80 year old woman who presents for f/u of right hand flexor spasticity following a fall resulting in spinal cord damage, as well as fibromyalgia, and left ankle sprain, and right primary knee OA.    Spasticity: continue daily stretching.   -Continue use of splint at night and exercises to maintain range of motion in right hand.   Fibromyalgia and diabetic peripheral neuropathy -Continue Cymbalta 30mg   -Continue Lyrica to 50mg  TID, provided refill.  -Recommended intermittent fasting to improve insulin sensitivity. Currently eats breakfast at 8am and dinner at 5:30pm. Recommended pushing breakfast to 8:15am -Recommended no added sugars and minimal bread, pasta, rice.  -Continue physical therapy in January for balance deficits  Nocturia:  -Provided referral to urology for  urodynamic studies to determine etiology of nocturia.   Constipation: -Constipation symptoms worsened with narcotic use. She has been using dulcolax suppository as needed and she has been comfortable with this, may continue this.  -Also recommended high fiber diet.  -improving -continue metamucil.  -BM every 2-3 days now  -Provided list of following foods that help with constipation and highlighted a few: 1) prunes- contain high amounts of fiber.  2) apples- has a form of dietary fiber called pectin that accelerates stool movement and increases beneficial gut bacteria 3) pears- in addition to fiber, also high in fructose and sorbitol which have laxative effect 4) figs- contain an enzyme ficin which helps to speed colonic transit 5) kiwis- contain an enzyme actinidin that improves gut motility and reduces constipation 6) oranges- rich in pectin (like apples) 7) grapefruits- contain a flavanol naringenin which has a laxative effect 8) vegetables- rich in fiber and also great sources of folate, vitamin C, and K 9) artichoke- high in inulin, prebiotic great for the microbiome 10) chicory- increases stool frequency and softness (can be added to coffee) 11) rhubarb- laxative effect 12) sweet potato- high fiber 13) beans, peas, and lentils- contain both soluble and insoluble fiber 14) chia seeds- improves intestinal health and gut flora 15) flaxseeds- laxative effect 16) whole grain rye bread- high in fiber 17) oat bran- high in soluble and insoluble fiber 18) kefir- softens stools -recommended to try at least one of  these foods every day.  -drink 6-8 glasses of water per day -walk regularly, especially after meals.    Right knee pain 2/2 OA: Viscosupplementation offered no benefits.  -Discussed risks and benefits of surgery -Recommended blue emu oil -Discussed current symptoms of pain and history of pain.  -Discussed benefits of exercise in reducing pain. -Sent referral for right knee  brace to [facility] with clinic notes.  -recommended low dose naltrexone and she is interested in this.  -Discussed following foods that may reduce pain: 1) Ginger (especially studied for arthritis)- reduce leukotriene production to decrease inflammation 2) Blueberries- high in phytonutrients that decrease inflammation 3) Salmon- marine omega-3s reduce joint swelling and pain 4) Pumpkin seeds- reduce inflammation 5) dark chocolate- reduces inflammation 6) turmeric- reduces inflammation 7) tart cherries - reduce pain and stiffness 8) extra virgin olive oil - its compound olecanthal helps to block prostaglandins  9) chili peppers- can be eaten or applied topically via capsaicin 10) mint- helpful for headache, muscle aches, joint pain, and itching 11) garlic- reduces inflammation  Link to further information on diet for chronic pain: [email or web address]  Left foot becomes purplish, tingling: -referred to vascular for eval for PVD  Right sided adhesive capsulitis: -recommended exercise to preserve range of motion

## 2020-06-17 DIAGNOSIS — M79642 Pain in left hand: Secondary | ICD-10-CM | POA: Diagnosis not present

## 2020-06-17 DIAGNOSIS — T148XXA Other injury of unspecified body region, initial encounter: Secondary | ICD-10-CM | POA: Diagnosis not present

## 2020-06-17 DIAGNOSIS — M62838 Other muscle spasm: Secondary | ICD-10-CM | POA: Diagnosis not present

## 2020-06-17 DIAGNOSIS — M79641 Pain in right hand: Secondary | ICD-10-CM | POA: Diagnosis not present

## 2020-06-25 ENCOUNTER — Other Ambulatory Visit: Payer: Self-pay | Admitting: Physical Medicine and Rehabilitation

## 2020-06-28 DIAGNOSIS — E1149 Type 2 diabetes mellitus with other diabetic neurological complication: Secondary | ICD-10-CM | POA: Diagnosis not present

## 2020-06-28 DIAGNOSIS — E119 Type 2 diabetes mellitus without complications: Secondary | ICD-10-CM | POA: Diagnosis not present

## 2020-06-30 ENCOUNTER — Ambulatory Visit (HOSPITAL_COMMUNITY)
Admission: RE | Admit: 2020-06-30 | Discharge: 2020-06-30 | Disposition: A | Discharge status: Home / Self Care | Payer: PPO | Source: Ambulatory Visit | Attending: Vascular Surgery | Admitting: Vascular Surgery

## 2020-06-30 ENCOUNTER — Encounter: Payer: Self-pay | Admitting: Vascular Surgery

## 2020-06-30 ENCOUNTER — Ambulatory Visit (INDEPENDENT_AMBULATORY_CARE_PROVIDER_SITE_OTHER): Payer: PPO | Admitting: Vascular Surgery

## 2020-06-30 ENCOUNTER — Other Ambulatory Visit: Payer: Self-pay

## 2020-06-30 VITALS — BP 147/76 | HR 64 | Temp 98.0°F | Resp 20 | Ht 64.0 in | Wt 204.0 lb

## 2020-06-30 DIAGNOSIS — I739 Peripheral vascular disease, unspecified: Secondary | ICD-10-CM | POA: Diagnosis not present

## 2020-06-30 DIAGNOSIS — R230 Cyanosis: Secondary | ICD-10-CM | POA: Diagnosis not present

## 2020-06-30 NOTE — Progress Notes (Signed)
REASON FOR CONSULT:    Left lower extremity turning blue.  The consult is requested by Dr. Tommie Raymond.  ASSESSMENT & PLAN:   PERIPHERAL CYANOSIS: This patient had had some peripheral cyanosis in the left foot which seems to have resolved.  She has palpable pedal pulses on exam and normal noninvasive studies with normal Doppler waveforms in the feet, normal ABIs, and normal toe pressures.  I reassured her that she has no evidence of arterial disease.  I have encouraged her to stay as active as possible. Dr. Ranell Patrick has gone over with her in detail some nutritional recommendations which I agree with wholeheartedly.  I favor a largely plant-based diet.  I will be happy to see her back in any time if any new vascular issues arise.   [name], MD Office: [phone]   HPI:   [name] is a pleasant 80 y.o. female, who was referred with some bluish discoloration of the left lower extremity.  I have reviewed the records from the referring office.  Patient has some right hand flexor spasticity following a fall which resulted in spinal cord damage.  In addition she has fibromyalgia and a history of a left ankle sprain with right knee osteoarthritis.  She was sent for vascular consultation.  The patient states that she had noted some occasional swelling in the left foot and also some occasional bluish discoloration.  She has injured that foot and has broken the foot 2 times.  She has had surgery in the past.  I do not get any history of claudication, rest pain, or nonhealing ulcers.  Risk factors for peripheral vascular disease include type 2 diabetes, hypertension, and hypercholesterolemia.  She denies any family history of premature cardiovascular disease.  She is not a smoker.  She is on aspirin and is on a statin.  Past Medical History:  Diagnosis Date  . Anemia    during 1st pregnancy  . Anxiety   . Arthritis   . Cancer ([place])    Basal - face  . Complication of  anesthesia   . Depression   . Diarrhea, functional   . Dysrhythmia    Paroysmal Atrial Tachycardia  . Essential hypertension   . Fibromyalgia   . GERD (gastroesophageal reflux disease)   . H/O acute pancreatitis   . Headache   . History of hiatal hernia   . Hyperlipemia   . Neuropathy   . Osteoporosis   . Panic attacks   . Paralysis ([place]) 01/06/2019   hands  . PAT (paroxysmal atrial tachycardia) ([place])   . Pneumonia   . PONV (postoperative nausea and vomiting)   . PSVT (paroxysmal supraventricular tachycardia) ([place])   . Reflux   . Restless leg syndrome   . Spinal cord compression ([place])   . Type 2 diabetes mellitus ([facility])    Type II  . Vertigo    not current    Family History  Problem Relation Age of Onset  . Heart failure Mother   . Hyperlipidemia Mother   . Lung cancer Father        Lymphoma  . Hyperlipidemia Maternal Grandmother   . Peripheral vascular disease Maternal Grandmother     SOCIAL HISTORY: Social History   Socioeconomic History  . Marital status: Widowed    Spouse name: Glendell Docker  . Number of children: 2  . Years of education: Not on file  . Highest education level: Not on file  Occupational History  . Occupation: Retired  Tobacco  Use  . Smoking status: Former Smoker  . Smokeless tobacco: Never Used  . Tobacco comment: smoke 1 cigarette every few weeks when the [place] lady came by.  Vaping Use  . Vaping Use: Never used  Substance and Sexual Activity  . Alcohol use: No  . Drug use: No  . Sexual activity: Not on file  Other Topics Concern  . Not on file  Social History Narrative   Lives home alone.  Daughter [name] with her today.   Independent of ADLs.  Education HS.  Widowed.     Social Determinants of Health   Financial Resource Strain: Low Risk   . Difficulty of Paying Living Expenses: Not hard at all  Food Insecurity: No Food Insecurity  . Worried About Charity fundraiser in the Last Year: Never true  . Ran Out of Food in the Last Year:  Never true  Transportation Needs: No Transportation Needs  . Lack of Transportation (Medical): No  . Lack of Transportation (Non-Medical): No  Physical Activity: Inactive  . Days of Exercise per Week: 0 days  . Minutes of Exercise per Session: 0 min  Stress: Stress Concern Present  . Feeling of Stress : Rather much  Social Connections: Moderately Isolated  . Frequency of Communication with Friends and Family: More than three times a week  . Frequency of Social Gatherings with Friends and Family: Never  . Attends Religious Services: 1 to 4 times per year  . Active Member of Clubs or Organizations: No  . Attends Archivist Meetings: Never  . Marital Status: Widowed  Intimate Partner Violence: Not At Risk  . Fear of Current or Ex-Partner: No  . Emotionally Abused: No  . Physically Abused: No  . Sexually Abused: No    Allergies  Allergen Reactions  . Amoxil [Amoxicillin] Swelling and Other (See Comments)    Lips Has patient had a PCN reaction causing immediate rash, facial/tongue/throat swelling, SOB or lightheadedness with hypotension:YES Has patient had a PCN reaction causing severe rash involving mucus membranes or skin necrosis:No Has patient had a PCN reaction that required hospitalization:No Has patient had a PCN reaction occurring within the last 10 years:Yes. If all of the above answers are "NO", then may proceed with Cephalosporin use.   Marland Kitchen Lisinopril Itching and Swelling    SWELLING REACTION UNSPECIFIED   . Chocolate Hives  . Demerol Nausea And Vomiting  . Other Other (See Comments)    Raw food or nuts - GI pain    Current Outpatient Medications  Medication Sig Dispense Refill  . acetaminophen (TYLENOL) 325 MG tablet Take 325 mg by mouth every 6 (six) hours as needed.    Marland Kitchen aspirin EC 81 MG tablet Take 81 mg by mouth at bedtime.     . bisacodyl (BISACODYL LAXATIVE) 10 MG suppository Place 1 suppository (10 mg total) rectally as needed for moderate  constipation. 12 suppository 1  . Calcium Carb-Cholecalciferol 600-200 MG-UNIT TABS Take 1 tablet by mouth in the morning and at bedtime.     . carbamide peroxide (DEBROX) 6.5 % OTIC solution Place 5 drops into the left ear 2 (two) times daily. 15 mL 0  . Continuous Blood Gluc Receiver (DEXCOM G6 RECEIVER) DEVI 1 Device by Does not apply route daily. 1 Device 0  . Continuous Blood Gluc Sensor (DEXCOM G6 SENSOR) MISC 1 kit by Does not apply route as directed. 3 each 3  . Continuous Blood Gluc Transmit (DEXCOM G6 TRANSMITTER) MISC 1 kit  by Does not apply route as directed. 1 each 3  . Cyanocobalamin (VITAMIN B 12 PO) Take 1,000 mg by mouth daily.    . digoxin (LANOXIN) 0.125 MG tablet TAKE 1 TABLET BY MOUTH DAILY. PLEASE KEEP UPCOMING APPT WITH DR. Marlou Porch IN SEPT FOR ANYMORE REFILLS 90 tablet 2  . docusate sodium (COLACE) 100 MG capsule Take 100 mg by mouth daily. twice a day    . DULoxetine (CYMBALTA) 30 MG capsule TAKE 2 CAPSULES BY MOUTH EVERY DAY 180 capsule 2  . ezetimibe (ZETIA) 10 MG tablet TAKE 1 TABLET BY MOUTH EVERY DAY 90 tablet 2  . glucose blood test strip Use as instructed to test blood sugars 3 times daily DX.E11.42 100 each 12  . icosapent Ethyl (VASCEPA) 1 g capsule Take 2 capsules (2 g total) by mouth 2 (two) times daily. 120 capsule 3  . insulin aspart (NOVOLOG FLEXPEN) 100 UNIT/ML FlexPen Inject 8 Units into the skin daily with breakfast AND 8 Units daily with lunch AND 10 Units daily with supper. Max daily 60 units per correction scale. 45 mL 6  . insulin glargine (LANTUS SOLOSTAR) 100 UNIT/ML Solostar Pen Inject 18 Units into the skin daily. 15 mL 11  . Insulin Pen Needle (BD PEN NEEDLE MICRO U/F) 32G X 6 MM MISC USE 4 TIMES A DAY NOVOLOG(3) AND LANTUS9(1) 100 each 11  . meloxicam (MOBIC) 15 MG tablet TAKE 1 TABLET (15 MG TOTAL) BY MOUTH DAILY. 30 tablet 1  . metoprolol succinate (TOPROL-XL) 25 MG 24 hr tablet TAKE 1 TABLET BY MOUTH DAILY. PLEASE KEEP UPCOMING APPT IN SEPT WITH  DR. Marlou Porch FOR ANYMORE REFILLS 90 tablet 2  . mirabegron ER (MYRBETRIQ) 50 MG TB24 tablet Take 1 tablet (50 mg total) by mouth daily. 90 tablet 2  . Multiple Vitamins-Minerals (PRESERVISION AREDS 2 PO) Take 1 tablet by mouth 2 (two) times daily.     Marland Kitchen omeprazole (PRILOSEC) 20 MG capsule Take 1 capsule (20 mg total) by mouth daily. 90 capsule 2  . pregabalin (LYRICA) 50 MG capsule Take 1 capsule (50 mg total) by mouth 3 (three) times daily. 90 capsule 1  . PREMARIN vaginal cream Place vaginally.    . ramipril (ALTACE) 10 MG capsule TAKE 1 CAPSULE (10 MG TOTAL) BY MOUTH EVERY EVENING. 90 capsule 2  . RELION INSULIN SYRINGE 1ML/31G 31G X 5/16" 1 ML MISC Use as directed 100 each 1  . rOPINIRole (REQUIP) 1 MG tablet TAKE ONE TABLET BY MOUTH AT BEDTIME. 90 tablet 1  . rosuvastatin (CRESTOR) 10 MG tablet TAKE 1 TABLET BY MOUTH EVERY DAY 90 tablet 1  . verapamil (CALAN) 120 MG tablet Take 120 mg by mouth daily as needed (afib).     . verapamil (VERELAN PM) 240 MG 24 hr capsule TAKE 1 CAPSULE BY MOUTH EVERY DAY 90 capsule 3  . VITAMIN D PO Take 10,000 Units by mouth daily. 10,000 in the am and 5,000 in the pm     No current facility-administered medications for this visit.    REVIEW OF SYSTEMS:  _0  denotes positive finding, _1  denotes negative finding Cardiac  Comments:  Chest pain or chest pressure:    Shortness of breath upon exertion: x   Short of breath when lying flat:    Irregular heart rhythm: x       Vascular    Pain in calf, thigh, or hip brought on by ambulation: x   Pain in feet at night that wakes you up  from your sleep:     Blood clot in your veins:    Leg swelling:  x       Pulmonary    Oxygen at home:    Productive cough:     Wheezing:         Neurologic    Sudden weakness in arms or legs:     Sudden numbness in arms or legs:     Sudden onset of difficulty speaking or slurred speech:    Temporary loss of vision in one eye:     Problems with dizziness:  x        Gastrointestinal    Blood in stool:     Vomited blood:         Genitourinary    Burning when urinating:  x   Blood in urine:        Psychiatric    Major depression:         Hematologic    Bleeding problems:    Problems with blood clotting too easily:        Skin    Rashes or ulcers:        Constitutional    Fever or chills:     PHYSICAL EXAM:   Vitals:   06/30/20 1345  BP: (!) 147/76  Pulse: 64  Resp: 20  Temp: 98 F (36.7 C)  SpO2: 97%  Weight: 204 lb (92.5 kg)  Height: _0  (1.626 m)    GENERAL: The patient is a well-nourished female, in no acute distress. The vital signs are documented above. CARDIAC: There is a regular rate and rhythm.  VASCULAR: I do not detect carotid bruits. She has palpable dorsalis pedis pulses bilaterally.  It is difficult to palpate posterior tibial pulses because of her foot swelling. PULMONARY: There is good air exchange bilaterally without wheezing or rales. ABDOMEN: Soft and non-tender with normal pitched bowel sounds. SKIN: There are no ulcers or rashes noted. PSYCHIATRIC: The patient has a normal affect.  DATA:    ARTERIAL DOPPLER STUDY: I have independently interpreted her arterial Doppler study.  On the right side there is a triphasic dorsalis pedis and posterior tibial signal.  ABIs greater than 100%.  Toe pressures 150 mmHg.  On the left side there is a triphasic dorsalis pedis and posterior tibial signal.  ABIs greater than 100.  Toe pressures 143 mmHg.

## 2020-07-04 ENCOUNTER — Other Ambulatory Visit: Payer: Self-pay | Admitting: Family Medicine

## 2020-07-07 ENCOUNTER — Encounter: Payer: Self-pay | Admitting: Family Medicine

## 2020-07-07 ENCOUNTER — Telehealth: Payer: Self-pay | Admitting: Family Medicine

## 2020-07-07 ENCOUNTER — Telehealth (INDEPENDENT_AMBULATORY_CARE_PROVIDER_SITE_OTHER): Payer: PPO | Admitting: Family Medicine

## 2020-07-07 VITALS — Ht 64.0 in

## 2020-07-07 DIAGNOSIS — N644 Mastodynia: Secondary | ICD-10-CM

## 2020-07-07 DIAGNOSIS — Z1231 Encounter for screening mammogram for malignant neoplasm of breast: Secondary | ICD-10-CM

## 2020-07-07 NOTE — Telephone Encounter (Signed)
Patient is calling and wanted to see if provider can put an order for patient to get a mammogram, please advise. CB is [phone]

## 2020-07-07 NOTE — Telephone Encounter (Signed)
It is ok to have mammogram ordered. Thanks, BJ

## 2020-07-07 NOTE — Telephone Encounter (Signed)
Pt is calling in stating that her

## 2020-07-07 NOTE — Telephone Encounter (Signed)
Okay to place order for mammogram?

## 2020-07-07 NOTE — Progress Notes (Signed)
Virtual Visit via Telephone Note  I connected with [name] on 07/07/20 at  4:30 PM EDT by telephone and verified that I am speaking with the correct person using two identifiers.   I discussed the limitations, risks, security and privacy concerns of performing an evaluation and management service by telephone and the availability of in person appointments. I also discussed with the patient that there may be a patient responsible charge related to this service. The patient expressed understanding and agreed to proceed.  Location patient: home Location provider: work office Participants present for the call: patient, caregiver,provider Patient did not have a visit in the prior 7 days to address this/these issue(s). Chief Complaint  Patient presents with  . tenderness in breast   History of Present Illness: [name] is a 80 yo female with hx of DM II,HLD,fibromyalgia,vit D deficiency,and HTN c/o 1.5 years of left breast pain,worse for the past 2 weeks. Last mammogram about 7 years ago but worse for the past 2 weeks. She describes pain like somebody is twisting her skin.  Mid lateral aspect, it is not involving areola/nipple. No hx of trauma.  She has hx of breast cyst in same breast that was drained years ago, she thinks she may be having same problem. She has not noted skin thickness changes, masses,erythema/rash,numbness,tingling,burning sensation,or nipple discharge. She has not tried OTC treatments. Pain is constant, exacerbated by wearing a bra and with certain movements. She has not noted fever,chills,cough,SOB,wheezing,or CP.   Observations/Objective: Patient sounds cheerful and well on the phone. I do not appreciate any SOB. Speech and thought processing are grossly intact, mildly anxious. Patient reported vitals:Ht 5\' 4"  (1.626 m)   BMI 35.02 kg/m   Assessment and Plan:  1. Breast tenderness in female We discussed possible etiologies. Dx mammogram will be  arranged. Instructed to monitor for rash on affected area and to let me know if she notices any. Topical icy hot/asper cream may help.  Follow Up Instructions:  Return if symptoms worsen or fail to improve.  I did not refer this patient for an OV in the next 24 hours for this/these issue(s).  I discussed the assessment and treatment plan with the patient. [name] was provided an opportunity to ask questions and all were answered. The patient agreed with the plan and demonstrated an understanding of the instructions.   The patient was advised to call back or seek an in-person evaluation if the symptoms worsen or if the condition fails to improve as anticipated.  I provided 9 minutes of non-face-to-face time during this encounter.  Leah Zimmermann [name], MD

## 2020-07-07 NOTE — Telephone Encounter (Signed)
No longer needed

## 2020-07-07 NOTE — Telephone Encounter (Signed)
I left patient a voicemail letting her know that the order has been placed & the breast center will contact her to schedule the appointment.

## 2020-07-13 ENCOUNTER — Other Ambulatory Visit: Payer: Self-pay | Admitting: Family Medicine

## 2020-07-13 ENCOUNTER — Telehealth: Payer: Self-pay | Admitting: Family Medicine

## 2020-07-13 ENCOUNTER — Encounter: Payer: Self-pay | Admitting: Family Medicine

## 2020-07-13 ENCOUNTER — Telehealth: Payer: PPO | Admitting: Family Medicine

## 2020-07-13 DIAGNOSIS — N644 Mastodynia: Secondary | ICD-10-CM

## 2020-07-13 DIAGNOSIS — L820 Inflamed seborrheic keratosis: Secondary | ICD-10-CM | POA: Diagnosis not present

## 2020-07-13 DIAGNOSIS — L720 Epidermal cyst: Secondary | ICD-10-CM | POA: Diagnosis not present

## 2020-07-13 NOTE — Telephone Encounter (Signed)
Patient is calling and is requesting a call back regarding get a referral to the breast center, please advise. CB is [phone]

## 2020-07-13 NOTE — Addendum Note (Signed)
Addended by: Nathanial Millman E on: 07/13/2020 12:57 PM   Modules accepted: Orders

## 2020-07-13 NOTE — Addendum Note (Signed)
Addended by: Nathanial Millman E on: 07/13/2020 12:18 PM   Modules accepted: Orders

## 2020-07-13 NOTE — Telephone Encounter (Signed)
I called and spoke with patient. Appointment is scheduled for 6/14 at 12:45pm at [facility] in [place]. Faxing orders to them, mailed pt a reminder with appointment info & address.

## 2020-07-23 ENCOUNTER — Telehealth: Payer: Self-pay | Admitting: Pharmacist

## 2020-07-23 DIAGNOSIS — I10 Essential (primary) hypertension: Secondary | ICD-10-CM | POA: Diagnosis not present

## 2020-07-23 DIAGNOSIS — Z6835 Body mass index (BMI) 35.0-35.9, adult: Secondary | ICD-10-CM | POA: Diagnosis not present

## 2020-07-23 DIAGNOSIS — M48062 Spinal stenosis, lumbar region with neurogenic claudication: Secondary | ICD-10-CM | POA: Diagnosis not present

## 2020-07-23 NOTE — Chronic Care Management (AMB) (Signed)
  Chronic Care Management Pharmacy Assistant   Name: [name]  MRN: [id number] DOB: 12/21/1940  Reason for Encounter: Disease State/ Diabetes Assessment Call.    Conditions to be addressed/monitored: DMII   Recent office visits:  07/07/20 Betty Jordan MD (PCP) - video visit for breast tenderness in female. No medication changes. Follow up as needed.  Recent consult visits:  06/30/20 Christopher Dickson MD (Vascular Surgery) - presented to clinic for initial visit for peripheral cyanosis. No medication changes and no follow up noted.   06/15/20 Krutika Raulkar MD (Physical Medicine and Rehab) - seen for primary osteoarthritis of right knee. No medication changes. Follow up in 3 months.   04/26/20 Krutika Raulkar MD (Physical Medicine and Rehab) - seen for primary osteoarthritis of right knee. Changed lyrica to 50mg 3 times daily instead of 2 times daily. Follow up in 7 weeks.   04/23/20 Matthew Olin (Orthopedic Surgery) -  seen for injection in the bursa joint of Triamcinolone. No follow up noted.    Hospital visits:  None in previous 6 months  Medications: Outpatient Encounter Medications as of 07/23/2020  Medication Sig Note   acetaminophen (TYLENOL) 325 MG tablet Take 325 mg by mouth every 6 (six) hours as needed.    aspirin EC 81 MG tablet Take 81 mg by mouth at bedtime.     bisacodyl (BISACODYL LAXATIVE) 10 MG suppository Place 1 suppository (10 mg total) rectally as needed for moderate constipation.    Calcium Carb-Cholecalciferol 600-200 MG-UNIT TABS Take 1 tablet by mouth in the morning and at bedtime.     carbamide peroxide (DEBROX) 6.5 % OTIC solution Place 5 drops into the left ear 2 (two) times daily. 11/11/2019: As needed   Continuous Blood Gluc Receiver (DEXCOM G6 RECEIVER) DEVI 1 Device by Does not apply route daily.    Continuous Blood Gluc Sensor (DEXCOM G6 SENSOR) MISC 1 kit by Does not apply route as directed.    Continuous Blood Gluc Transmit (DEXCOM G6  TRANSMITTER) MISC 1 kit by Does not apply route as directed.    Cyanocobalamin (VITAMIN B 12 PO) Take 1,000 mg by mouth daily.    digoxin (LANOXIN) 0.125 MG tablet TAKE 1 TABLET BY MOUTH DAILY. PLEASE KEEP UPCOMING APPT WITH DR. SKAINS IN SEPT FOR ANYMORE REFILLS    docusate sodium (COLACE) 100 MG capsule Take 100 mg by mouth daily. twice a day    DULoxetine (CYMBALTA) 30 MG capsule TAKE 2 CAPSULES BY MOUTH EVERY DAY    ezetimibe (ZETIA) 10 MG tablet TAKE 1 TABLET BY MOUTH EVERY DAY    glucose blood test strip Use as instructed to test blood sugars 3 times daily DX.E11.42    icosapent Ethyl (VASCEPA) 1 g capsule Take 2 capsules (2 g total) by mouth 2 (two) times daily.    insulin aspart (NOVOLOG FLEXPEN) 100 UNIT/ML FlexPen Inject 8 Units into the skin daily with breakfast AND 8 Units daily with lunch AND 10 Units daily with supper. Max daily 60 units per correction scale.    insulin glargine (LANTUS SOLOSTAR) 100 UNIT/ML Solostar Pen Inject 18 Units into the skin daily.    Insulin Pen Needle (BD PEN NEEDLE MICRO U/F) 32G X 6 MM MISC USE 4 TIMES A DAY NOVOLOG(3) AND LANTUS9(1)    meloxicam (MOBIC) 15 MG tablet TAKE 1 TABLET (15 MG TOTAL) BY MOUTH DAILY.    metoprolol succinate (TOPROL-XL) 25 MG 24 hr tablet TAKE 1 TABLET BY MOUTH DAILY. PLEASE KEEP UPCOMING APPT IN   SEPT WITH DR. SKAINS FOR ANYMORE REFILLS    mirabegron ER (MYRBETRIQ) 50 MG TB24 tablet Take 1 tablet (50 mg total) by mouth daily.    Multiple Vitamins-Minerals (PRESERVISION AREDS 2 PO) Take 1 tablet by mouth 2 (two) times daily.     omega-3 acid ethyl esters (LOVAZA) 1 g capsule Take 1 capsule by mouth 2 (two) times daily.    omeprazole (PRILOSEC) 20 MG capsule Take 1 capsule (20 mg total) by mouth daily.    pregabalin (LYRICA) 50 MG capsule Take 1 capsule (50 mg total) by mouth 3 (three) times daily.    PREMARIN vaginal cream Place vaginally.    ramipril (ALTACE) 10 MG capsule TAKE 1 CAPSULE (10 MG TOTAL) BY MOUTH EVERY EVENING.     RELION INSULIN SYRINGE 1ML/31G 31G X 5/16" 1 ML MISC Use as directed    rOPINIRole (REQUIP) 1 MG tablet TAKE ONE TABLET BY MOUTH AT BEDTIME.    rosuvastatin (CRESTOR) 10 MG tablet TAKE 1 TABLET BY MOUTH EVERY DAY    verapamil (CALAN) 120 MG tablet Take 120 mg by mouth daily as needed (afib).  11/11/2019: As needed   verapamil (VERELAN PM) 240 MG 24 hr capsule TAKE 1 CAPSULE BY MOUTH EVERY DAY    VITAMIN D PO Take 10,000 Units by mouth daily. 10,000 in the am and 5,000 in the pm    No facility-administered encounter medications on file as of 07/23/2020.    Recent Relevant Labs: Lab Results  Component Value Date/Time   HGBA1C 7.2 (A) 03/09/2020 10:43 AM   HGBA1C 7.7 (A) 09/03/2019 10:42 AM   HGBA1C 7.6 (H) 11/12/2017 10:29 AM   HGBA1C 6.7 (H) 09/04/2017 12:12 PM   HGBA1C 7.7 01/20/2016 12:00 AM   MICROALBUR 23.9 (H) 09/03/2019 11:09 AM   MICROALBUR 26.1 (H) 01/12/2017 11:42 AM    Kidney Function Lab Results  Component Value Date/Time   CREATININE 1.24 (H) 09/05/2019 02:10 PM   CREATININE 1.14 09/03/2019 11:09 AM   CREATININE 0.89 01/08/2019 12:19 PM   CREATININE 0.96 (H) 10/19/2017 04:02 PM   GFR 46.21 (L) 09/03/2019 11:09 AM   GFRNONAA >60 01/08/2019 12:19 PM   GFRAA >60 01/08/2019 12:19 PM    Current antihyperglycemic regimen:  Lantus inject 18 units daily Novolog inject 8  units with breakfast and lunch and 10 units with supper What recent interventions/DTPs have been made to improve glycemic control:  None.  Have there been any recent hospitalizations or ED visits since last visit with CPP? No Patient hypoglycemic symptoms, including  Patient hyperglycemic symptoms, including  How often are you checking your blood sugar?  What are your blood sugars ranging?  Fasting:  Before meals:  After meals:  Bedtime:  During the week, how often does your blood glucose drop below 70?  Are you checking your feet daily/regularly?   Adherence Review: Is the patient currently on a  STATIN medication? Yes Is the patient currently on ACE/ARB medication? Yes Does the patient have >5 day gap between last estimated fill dates? No  Reviewed chart prior to disease state call. Spoke with patient regarding BP  Recent Office Vitals: BP Readings from Last 3 Encounters:  06/30/20 (!) 147/76  04/26/20 (!) 150/84  03/15/20 124/70   Pulse Readings from Last 3 Encounters:  06/30/20 64  06/15/20 71  04/26/20 70    Wt Readings from Last 3 Encounters:  06/30/20 204 lb (92.5 kg)  06/15/20 202 lb (91.6 kg)  04/26/20 202 lb (91.6 kg)       Kidney Function Lab Results  Component Value Date/Time   CREATININE 1.24 (H) 09/05/2019 02:10 PM   CREATININE 1.14 09/03/2019 11:09 AM   CREATININE 0.89 01/08/2019 12:19 PM   CREATININE 0.96 (H) 10/19/2017 04:02 PM   GFR 46.21 (L) 09/03/2019 11:09 AM   GFRNONAA >60 01/08/2019 12:19 PM   GFRAA >60 01/08/2019 12:19 PM    BMP Latest Ref Rng & Units 09/05/2019 09/03/2019 01/08/2019  Glucose 65 - 99 mg/dL 202(H) 152(H) 151(H)  BUN 7 - 25 mg/dL 30(H) 27(H) 18  Creatinine 0.60 - 0.93 mg/dL 1.24(H) 1.14 0.89  BUN/Creat Ratio 6 - 22 (calc) 24(H) - -  Sodium 135 - 146 mmol/L 133(L) 135 135  Potassium 3.5 - 5.3 mmol/L 4.4 4.5 4.4  Chloride 98 - 110 mmol/L 101 101 103  CO2 20 - 32 mmol/L _0 Calcium 8.6 - 10.4 mg/dL 10.0 9.9 9.4    Current antihypertensive regimen:   How often are you checking your Blood Pressure?  Current home BP readings:  What recent interventions/DTPs have been made by any provider to improve Blood Pressure control since last CPP Visit:  Any recent hospitalizations or ED visits since last visit with CPP?  What diet changes have been made to improve Blood Pressure Control?   What exercise is being done to improve your Blood Pressure Control?    Adherence Review: Is the patient currently on ACE/ARB medication?  Does the patient have >5 day gap between last estimated fill dates?   Was unsuccessful on multiple  attempts to reach patient by phone. Will try again next month.   Star Rating Drugs:  Ramipril 66m - last filled on 07/20/20 at [facility] Rosuvastatin 166m- [facility] ([phone]

## 2020-07-26 NOTE — Telephone Encounter (Cosign Needed)
2nd attempt

## 2020-07-27 NOTE — Telephone Encounter (Cosign Needed)
3rd attempt. Will try tomorrow at 2pm.

## 2020-07-28 DIAGNOSIS — M21372 Foot drop, left foot: Secondary | ICD-10-CM | POA: Diagnosis not present

## 2020-07-29 DIAGNOSIS — E1149 Type 2 diabetes mellitus with other diabetic neurological complication: Secondary | ICD-10-CM | POA: Diagnosis not present

## 2020-07-29 DIAGNOSIS — E119 Type 2 diabetes mellitus without complications: Secondary | ICD-10-CM | POA: Diagnosis not present

## 2020-08-03 ENCOUNTER — Other Ambulatory Visit: Payer: Self-pay | Admitting: *Deleted

## 2020-08-03 MED ORDER — PREGABALIN 50 MG PO CAPS: 50.0000 mg | ORAL_CAPSULE | Freq: Three times a day (TID) | ORAL | 1 refills | Status: DC

## 2020-08-04 DIAGNOSIS — R262 Difficulty in walking, not elsewhere classified: Secondary | ICD-10-CM | POA: Diagnosis not present

## 2020-08-04 DIAGNOSIS — M48062 Spinal stenosis, lumbar region with neurogenic claudication: Secondary | ICD-10-CM | POA: Diagnosis not present

## 2020-08-05 DIAGNOSIS — N644 Mastodynia: Secondary | ICD-10-CM | POA: Diagnosis not present

## 2020-08-05 DIAGNOSIS — R922 Inconclusive mammogram: Secondary | ICD-10-CM | POA: Diagnosis not present

## 2020-08-06 ENCOUNTER — Telehealth: Payer: Self-pay

## 2020-08-06 MED ORDER — PREGABALIN 50 MG PO CAPS: 50.0000 mg | ORAL_CAPSULE | Freq: Three times a day (TID) | ORAL | 1 refills | Status: DC

## 2020-08-06 NOTE — Telephone Encounter (Signed)
Per pharmacy last Rx not received. Verbal given today.

## 2020-08-18 ENCOUNTER — Other Ambulatory Visit: Payer: Self-pay | Admitting: Physical Medicine and Rehabilitation

## 2020-08-18 ENCOUNTER — Other Ambulatory Visit: Payer: Self-pay | Admitting: Family Medicine

## 2020-08-20 DIAGNOSIS — M48062 Spinal stenosis, lumbar region with neurogenic claudication: Secondary | ICD-10-CM | POA: Diagnosis not present

## 2020-08-20 DIAGNOSIS — R262 Difficulty in walking, not elsewhere classified: Secondary | ICD-10-CM | POA: Diagnosis not present

## 2020-08-23 ENCOUNTER — Other Ambulatory Visit: Payer: PPO

## 2020-08-30 DIAGNOSIS — E1149 Type 2 diabetes mellitus with other diabetic neurological complication: Secondary | ICD-10-CM | POA: Diagnosis not present

## 2020-08-30 DIAGNOSIS — E119 Type 2 diabetes mellitus without complications: Secondary | ICD-10-CM | POA: Diagnosis not present

## 2020-09-01 ENCOUNTER — Other Ambulatory Visit: Payer: Self-pay | Admitting: Family Medicine

## 2020-09-01 ENCOUNTER — Telehealth: Payer: Self-pay | Admitting: Physical Medicine and Rehabilitation

## 2020-09-01 ENCOUNTER — Other Ambulatory Visit: Payer: Self-pay | Admitting: Physical Medicine and Rehabilitation

## 2020-09-01 DIAGNOSIS — M797 Fibromyalgia: Secondary | ICD-10-CM

## 2020-09-01 NOTE — Telephone Encounter (Signed)
She called and said in acute pain and feels cold - needs pain medication refill- would like to talk to someone  [phone]

## 2020-09-02 ENCOUNTER — Other Ambulatory Visit: Payer: Self-pay

## 2020-09-02 ENCOUNTER — Encounter: Payer: Self-pay | Admitting: Physical Medicine and Rehabilitation

## 2020-09-02 ENCOUNTER — Encounter: Payer: PPO | Attending: Physical Medicine and Rehabilitation | Admitting: Physical Medicine and Rehabilitation

## 2020-09-02 DIAGNOSIS — M1711 Unilateral primary osteoarthritis, right knee: Secondary | ICD-10-CM | POA: Diagnosis not present

## 2020-09-02 DIAGNOSIS — G629 Polyneuropathy, unspecified: Secondary | ICD-10-CM | POA: Insufficient documentation

## 2020-09-02 MED ORDER — METHYLPREDNISOLONE 4 MG PO TBPK: ORAL_TABLET | ORAL | 0 refills | Status: DC

## 2020-09-02 NOTE — Progress Notes (Signed)
Subjective:    Patient ID: [name], female    DOB: April 23, 1940, 80 y.o.   MRN: [id number]  HPI  An audio/video tele-health visit is felt to be the most appropriate encounter for this patient at this time.This is a follow up tele-visit via phone. The patient is at home. MD is at office.    [name] is a 80 year old woman who presents for follow-up of fibromyalgia and primary right knee OA:  1) Fibromyalgia: -I spoke with Leah prior to this appointment and she mentioned that Lyrica has been helping -last refilled 4/20.  -[name] asks if Lyrica dose may be increased. She is currently taking 50mg  three times per day.  -She is not experiencing negative side effects from Lyrica -her pain levels have been high recently.   2) Primary OA of right knee: -She has been wearing a brace for her left knee and this has really helped. She received it from Dr. Alvan Dame at [facility]. -She would like a similar brace for her right knee -She has noted buckling when she walks -She would like to avoid surgery if possible. -she takes 2 tylenol in the morning and this provides relief after about 30 minutes -Has not been tested for RF recently it appears -has been to rheumatology and felt they were in a hurry. -Pain is worst in the morning when she gets up.  -does not want rheumatology referral.  -had steroid injection Friday without benefit.  -has been worsening -she has been wearing her brace -does not want to pursue knee replacement   Prior history: [name] is a 80 year old woman who presents for follow-up of pain in her right anterior thigh as well as right hand flexor spasticity. She takes 2 tylenol in the morning and it takes about an hour and half for this to work. It usually lasts her until the ned of the day. She has also been having neuropathy which causes her to lose her balance. She asks me about a device that she saw on TV. She also plans to start physical therapy in  January. She had an exacerbation of her pain since last visit and saw Dr. [name] and had a repeat MRI- there was nothing acute that warranted surgery. During this flare she took narcotics and did not eat much. During that time she was more constipated.   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 Her Leah accompanies her today.    She has a history of severe cervical, thoracic, and lumbar spinal stenosis s/p two ACDFs and thoracic decompression, fusion, and posterior lateral arthrodesis with pedicle screws.    She has been having severe right sided flexor spasticity since then causing her fingers to curve inward to her palms. She has been using hand splints and exercising her hands to keep them mobile. This is very disruptive to her as she would love to be able to return to cooking and knitting, activities that she loves. She does not have other areas of spasticity. I have administered Botox to her right hand twice- the first time there was minimal benefit and the second time it made her arm weaker which lessened her ability to hold her walker. She prefers not to try the Botox again. She has been able to open her hand more but she has no functional improvement.   She ambulates using a walker  and has had no recent falls. She is currently receiving PT for her right handed tone.    Her pain is neuropathic in nature and present in both hands and feet. She has uncontrolled diabetes and her insulin dose was recently increased.   She also has fibromyalgia and takes Cymbalta 30mg  which helps. I had started her on Lyrica as well and this did provide relief but she did not like that it made her sleepy during the day. She would like to increase dose to 25mg  BID and currently has enough medication.   She has been continuing to take Meloxicam 15mg  daily.   Her right knee pain has recently flared.  She has swelling but no erythema. She has received steroid injections for arthritis in the past. She would be interested in viscosupplementation.   She has been urinating very frequently at night and this is very bothersome to her. She is on Myrbetriq. She has not seen a urologist recently. In the past she saw a urologist at [facility] and had a good experience there. Would like to return there. Has never had urodynamic studies in the past.   Pain Inventory Average Pain 7 Pain Right Now 1 My pain is intermittent, burning and aching  In the last 24 hours, has pain interfered with the following? General activity 5 Relation with others 1 Enjoyment of life 0 What TIME of day is your pain at its worst? morning Sleep (in general) Good  Pain is worse with: walking, bending, standing and some activites Pain improves with: rest and medication Relief from Meds: 7      Family History  Problem Relation Age of Onset   Heart failure Mother    Hyperlipidemia Mother    Lung cancer Father        Lymphoma   Hyperlipidemia Maternal Grandmother    Peripheral vascular disease Maternal Grandmother    Social History   Socioeconomic History   Marital status: Widowed    Spouse name: [name]   Number of children: 2   Years of education: Not on file   Highest education level: Not on file  Occupational History   Occupation: Retired  Tobacco Use   Smoking status: Former   Smokeless tobacco: Never   Tobacco comments:    smoke 1 cigarette every few weeks when the [place] lady came by.  Vaping Use   Vaping Use: Never used  Substance and Sexual Activity   Alcohol use: No   Drug use: No   Sexual activity: Not on file  Other Topics Concern   Not on file  Social History Narrative   Lives home alone.  [name] with her today.   Independent of ADLs.  Education HS.  Widowed.     Social Determinants of Health   Financial Resource Strain: Low Risk    Difficulty of Paying Living  Expenses: Not hard at all  Food Insecurity: No Food Insecurity   Worried About Charity fundraiser in the Last Year: Never true   [place] in the Last Year: Never true  Transportation Needs: No Transportation Needs   Lack of Transportation (Medical): No   Lack of Transportation (Non-Medical): No  Physical Activity: Inactive   Days of Exercise per Week: 0 days   Minutes of Exercise per Session: 0 min  Stress: Stress Concern Present   Feeling of Stress : Rather much  Social Connections: Moderately Isolated   Frequency of Communication with Friends and Family: More  than three times a week   Frequency of Social Gatherings with Friends and Family: Never   Attends Religious Services: 1 to 4 times per year   Active Member of [employer] or Organizations: No   Attends Archivist Meetings: Never   Marital Status: Widowed   Past Surgical History:  Procedure Laterality Date   ABDOMINAL HYSTERECTOMY     ANKLE SURGERY Left    x 2   ANTERIOR CERVICAL DECOMP/DISCECTOMY FUSION N/A 11/12/2017   Procedure: Cervical seven to Thoracic one  Anterior cervical decompression/discectomy/fusion with removal of old plate;  Surgeon: [name], MD;  Location: [facility];  Service: Neurosurgery;  Laterality: N/A;   BACK SURGERY     T12-L1 fusion, Anterior Cervical fusion   CARDIAC CATHETERIZATION N/A 02/16/2015   Procedure: Left Heart Cath and Coronary Angiography;  Surgeon: Sherren Mocha, MD;  Location: [facility];  Service: Cardiovascular;  Laterality: N/A;   CHOLECYSTECTOMY     COLONOSCOPY     ESOPHAGOGASTRODUODENOSCOPY  12/21/2010   Procedure: ESOPHAGOGASTRODUODENOSCOPY (EGD);  Surgeon: Landry Dyke, MD;  Location: [facility];  Service: Endoscopy;  Laterality: N/A;   EXCISION MORTON'S NEUROMA Right    EYE SURGERY Bilateral    Cataract   FRACTURE SURGERY     ankle x 2    KNEE SURGERY Right    x 3- arthroscopy   NECK SURGERY     POSTERIOR CERVICAL FUSION/FORAMINOTOMY N/A  01/22/2018   Procedure: Cervical six-seven Posterior Decompression with Posterior Fixation Cervical Five to Thoracic Two;  Surgeon: [name], MD;  Location: [facility];  Service: Neurosurgery;  Laterality: N/A;  Cervical 6-7 Posteror decompression with posterior fixation Cervical 5 to Thoracic 2   ROTATOR CUFF REPAIR Right    TONSILLECTOMY     TOTAL KNEE ARTHROPLASTY Left 04/10/2017   Procedure: LEFT TOTAL KNEE ARTHROPLASTY;  Surgeon: Paralee Cancel, MD;  Location: [facility];  Service: Orthopedics;  Laterality: Left;  70 mins   UPPER GASTROINTESTINAL ENDOSCOPY     WRIST SURGERY Left    fracture   Past Medical History:  Diagnosis Date   Anemia    during 1st pregnancy   Anxiety    Arthritis    Cancer ([place])    Basal - face   Complication of anesthesia    Depression    Diarrhea, functional    Dysrhythmia    Paroysmal Atrial Tachycardia   Essential hypertension    Fibromyalgia    GERD (gastroesophageal reflux disease)    H/O acute pancreatitis    Headache    History of hiatal hernia    Hyperlipemia    Neuropathy    Osteoporosis    Panic attacks    Paralysis ([place]) 01/06/2019   hands   PAT (paroxysmal atrial tachycardia) (HCC)    Pneumonia    PONV (postoperative nausea and vomiting)    PSVT (paroxysmal supraventricular tachycardia) (HCC)    Reflux    Restless leg syndrome    Spinal cord compression (HCC)    Type 2 diabetes mellitus (HCC)    Type II   Vertigo    not current   There were no vitals taken for this visit.  Opioid Risk Score:   Fall Risk Score:  `1  Depression screen PHQ 2/9  Depression screen [facility] 2/9 12/12/2019 11/11/2019 04/08/2019 09/28/2018 08/23/2018 10/19/2017 09/05/2017  Decreased Interest 3 1 3  0 0 1 0  Down, Depressed, Hopeless 3 1 3 1  0 2 0  PHQ - 2 Score 6 2 6  1 0 3 0  Altered sleeping - 0 3 - - 2 -  Tired, decreased energy - 1 3 - - 3 -  Change in appetite - 1 0 - - 3 -  Feeling bad or failure about yourself  - 1 3 - - 3 -  Trouble concentrating - 0 3  - - 2 -  Moving slowly or fidgety/restless - - 2 - - 1 -  Suicidal thoughts - 1 3 - - 3 -  PHQ-9 Score - 6 23 - - 20 -  Difficult doing work/chores - Somewhat difficult - - - Not difficult at all -  Some recent data might be hidden     Review of Systems     Objective:   Physical Exam Not performed    Assessment & Plan:  Leah. Bjelland is a 80 year old woman who presents for f/u of right hand flexor spasticity following a fall resulting in spinal cord damage, as well as fibromyalgia, and left ankle sprain, and right primary knee OA.    Spasticity: continue daily stretching.   -Continue use of splint at night and exercises to maintain range of motion in right hand.   Fibromyalgia and diabetic peripheral neuropathy -Continue Cymbalta 30mg   -Continue Lyrica to 50mg  TID, provided refill.  -Recommended intermittent fasting to improve insulin sensitivity. Currently eats breakfast at 8am and dinner at 5:30pm. Recommended pushing breakfast to 8:15am -Recommended no added sugars and minimal bread, pasta, rice.  -Continue physical therapy in January for balance deficits  Nocturia:  -Provided referral to urology for urodynamic studies to determine etiology of nocturia.   Constipation: -Constipation symptoms worsened with narcotic use. She has been using dulcolax suppository as needed and she has been comfortable with this, may continue this.  -Also recommended high fiber diet.  -improving -continue metamucil.  -BM every 2-3 days now  -Provided list of following foods that help with constipation and highlighted a few: 1) prunes- contain high amounts of fiber.  2) apples- has a form of dietary fiber called pectin that accelerates stool movement and increases beneficial gut bacteria 3) pears- in addition to fiber, also high in fructose and sorbitol which have laxative effect 4) figs- contain an enzyme ficin which helps to speed colonic transit 5) kiwis- contain an enzyme actinidin that  improves gut motility and reduces constipation 6) oranges- rich in pectin (like apples) 7) grapefruits- contain a flavanol naringenin which has a laxative effect 8) vegetables- rich in fiber and also great sources of folate, vitamin C, and K 9) artichoke- high in inulin, prebiotic great for the microbiome 10) chicory- increases stool frequency and softness (can be added to coffee) 11) rhubarb- laxative effect 12) sweet potato- high fiber 13) beans, peas, and lentils- contain both soluble and insoluble fiber 14) chia seeds- improves intestinal health and gut flora 15) flaxseeds- laxative effect 16) whole grain rye bread- high in fiber 17) oat bran- high in soluble and insoluble fiber 18) kefir- softens stools -recommended to try at least one of these foods every day.  -drink 6-8 glasses of water per day -walk regularly, especially after meals.    Right knee pain 2/2 OA: Viscosupplementation offered no benefits.  -Discussed risks and benefits of surgery -Recommended blue emu oil -Discussed current symptoms of pain and history of pain.  -Discussed benefits of exercise in reducing pain. -Sent referral for right knee brace to [facility] with clinic notes.  -recommended low dose naltrexone and she is interested in this.  -Discussed following foods  that may reduce pain: 1) Ginger (especially studied for arthritis)- reduce leukotriene production to decrease inflammation 2) Blueberries- high in phytonutrients that decrease inflammation 3) Salmon- marine omega-3s reduce joint swelling and pain 4) Pumpkin seeds- reduce inflammation 5) dark chocolate- reduces inflammation 6) turmeric- reduces inflammation 7) tart cherries - reduce pain and stiffness 8) extra virgin olive oil - its compound olecanthal helps to block prostaglandins  9) chili peppers- can be eaten or applied topically via capsaicin 10) mint- helpful for headache, muscle aches, joint pain, and itching 11) garlic- reduces  inflammation  Link to further information on diet for chronic pain: http://www.randall.com/ -prescribed steroid dose pack --Discussed Qutenza as an option for neuropathic pain control. Discussed that this is a capsaicin patch, stronger than capsaicin cream. Discussed that it is currently approved for diabetic peripheral neuropathy and post-herpetic neuralgia, but that it has also shown benefit in treating other forms of neuropathy. Provided patient with link to site to learn more about the patch: [employer]fr. Discussed that the patch would be placed in office and benefits usually last 3 months. Discussed that unintended exposure to capsaicin can cause severe irritation of eyes, mucous membranes, respiratory tract, and skin, but that Qutenza is a local treatment and does not have the systemic side effects of other nerve medications. Discussed that there may be pain, itching, erythema, and decreased sensory function associated with the application of Qutenza. Side effects usually subside within 1 week. A cold pack of analgesic medications can help with these side effects. Blood pressure can also be increased due to pain associated with administration of the patch.   Left foot becomes purplish, tingling: -referred to vascular for eval for PVD  Right sided adhesive capsulitis: -recommended exercise to preserve range of motion  10 minutes spent in discussion of patient's pain, steroid dose pack, sending medication, Qutenza, scheduling her for f/u

## 2020-09-03 ENCOUNTER — Other Ambulatory Visit: Payer: Self-pay | Admitting: Family Medicine

## 2020-09-03 DIAGNOSIS — M48062 Spinal stenosis, lumbar region with neurogenic claudication: Secondary | ICD-10-CM | POA: Diagnosis not present

## 2020-09-03 DIAGNOSIS — R262 Difficulty in walking, not elsewhere classified: Secondary | ICD-10-CM | POA: Diagnosis not present

## 2020-09-03 NOTE — Progress Notes (Signed)
It seems like Vascepa is not covered by her insurance, so continue Lovaza. Leah Cuervo [name], MD

## 2020-09-05 ENCOUNTER — Other Ambulatory Visit: Payer: Self-pay | Admitting: Family Medicine

## 2020-09-05 DIAGNOSIS — R32 Unspecified urinary incontinence: Secondary | ICD-10-CM

## 2020-09-06 ENCOUNTER — Other Ambulatory Visit: Payer: Self-pay

## 2020-09-06 ENCOUNTER — Encounter: Payer: Self-pay | Admitting: Physical Medicine and Rehabilitation

## 2020-09-06 ENCOUNTER — Encounter: Payer: PPO | Admitting: Physical Medicine and Rehabilitation

## 2020-09-06 VITALS — BP 121/77 | HR 64 | Temp 97.5°F | Ht 64.0 in | Wt 205.8 lb

## 2020-09-06 DIAGNOSIS — G629 Polyneuropathy, unspecified: Secondary | ICD-10-CM

## 2020-09-06 DIAGNOSIS — M1711 Unilateral primary osteoarthritis, right knee: Secondary | ICD-10-CM | POA: Diagnosis not present

## 2020-09-06 NOTE — Progress Notes (Signed)
Subjective:    Patient ID: [name], female    DOB: 10-Feb-1941, 80 y.o.   MRN: [id number]  HPI  [name] is a 80 year old woman who presents for f/u of fibromyalgia, primary right knee OA, and diabetic peripheral neuropathy.   1) Fibromyalgia: -I spoke with daughter prior to this appointment and she mentioned that Lyrica has been helping -her pain levels have been high recently.   2) Primary OA of right knee: -She has been wearing a brace for her left knee and this has really helped. She received it from Dr. Alvan Dame at [facility]. -She would like a similar brace for her right knee -She has noted buckling when she walks -She would like to avoid surgery if possible. -she takes 2 tylenol in the morning and this provides relief after about 30 minutes -Has not been tested for RF recently it appears -has been to rheumatology and felt they were in a hurry. -Pain is worst in the morning when she gets up.  -does not want rheumatology referral.  -had steroid injection Friday without benefit.  -has been worsening -she has been wearing her brace -does not want to pursue knee replacement -the steroid pack has greatly helped but pain continues to be severe  3) Bilateral diabetic peripheral neuropathy: -lyrica does help -would like to proceed with Qutenza -has neuropathy on soles of both feet as well as in toes  Prior history: Leah. [name] is a 80 year old woman who presents for follow-up of pain in her right anterior thigh as well as right hand flexor spasticity. She takes 2 tylenol in the morning and it takes about an hour and half for this to work. It usually lasts her until the ned of the day. She has also been having neuropathy which causes her to lose her balance. She asks me about a device that she saw on TV. She also plans to start physical therapy in January. She had an exacerbation of her pain since last visit and saw Dr. [name] and had a repeat MRI- there was nothing acute that  warranted surgery. During this flare she took narcotics and did not eat much. During that time she was more constipated.   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 Her daughter accompanies her today.    She has a history of severe cervical, thoracic, and lumbar spinal stenosis s/p two ACDFs and thoracic decompression, fusion, and posterior lateral arthrodesis with pedicle screws.    She has been having severe right sided flexor spasticity since then causing her fingers to curve inward to her palms. She has been using hand splints and exercising her hands to keep them mobile. This is very disruptive to her as she would love to be able to return to cooking and knitting, activities that she loves. She does not have other areas of spasticity. I have administered Botox to her right hand twice- the first time there was minimal benefit and the second time it made her arm weaker which lessened her ability to hold her walker. She prefers not to try the Botox again. She has been able to open her hand more but she has no functional improvement.   She ambulates using a walker and has had no recent falls. She is currently receiving PT for her right handed tone.    Her pain is neuropathic in nature and present in  both hands and feet. She has uncontrolled diabetes and her insulin dose was recently increased.   She also has fibromyalgia and takes Cymbalta '30mg'$  which helps. I had started her on Lyrica as well and this did provide relief but she did not like that it made her sleepy during the day. She would like to increase dose to '25mg'$  BID and currently has enough medication.   She has been continuing to take Meloxicam '15mg'$  daily.   Her right knee pain has recently flared. She has swelling but no erythema. She has received steroid injections for arthritis in the past. She would be interested in  viscosupplementation.   She has been urinating very frequently at night and this is very bothersome to her. She is on Myrbetriq. She has not seen a urologist recently. In the past she saw a urologist at [facility] and had a good experience there. Would like to return there. Has never had urodynamic studies in the past.   Pain Inventory Average Pain 7 Pain Right Now 1 My pain is intermittent, burning and aching  In the last 24 hours, has pain interfered with the following? General activity 5 Relation with others 1 Enjoyment of life 0 What TIME of day is your pain at its worst? morning Sleep (in general) Good  Pain is worse with: walking, bending, standing and some activites Pain improves with: rest and medication Relief from Meds: 7      Family History  Problem Relation Age of Onset   Heart failure Mother    Hyperlipidemia Mother    Lung cancer Father        Lymphoma   Hyperlipidemia Maternal Grandmother    Peripheral vascular disease Maternal Grandmother    Social History   Socioeconomic History   Marital status: Widowed    Spouse name: Glendell Docker   Number of children: 2   Years of education: Not on file   Highest education level: Not on file  Occupational History   Occupation: Retired  Tobacco Use   Smoking status: Former   Smokeless tobacco: Never   Tobacco comments:    smoke 1 cigarette every few weeks when the [place] lady came by.  Vaping Use   Vaping Use: Never used  Substance and Sexual Activity   Alcohol use: No   Drug use: No   Sexual activity: Not on file  Other Topics Concern   Not on file  Social History Narrative   Lives home alone.  Daughter [name] with her today.   Independent of ADLs.  Education HS.  Widowed.     Social Determinants of Health   Financial Resource Strain: Low Risk    Difficulty of Paying Living Expenses: Not hard at all  Food Insecurity: No Food Insecurity   Worried About Charity fundraiser in the Last Year: Never  true   [place] in the Last Year: Never true  Transportation Needs: No Transportation Needs   Lack of Transportation (Medical): No   Lack of Transportation (Non-Medical): No  Physical Activity: Inactive   Days of Exercise per Week: 0 days   Minutes of Exercise per Session: 0 min  Stress: Stress Concern Present   Feeling of Stress : Rather much  Social Connections: Moderately Isolated   Frequency of Communication with Friends and Family: More than three times a week   Frequency of Social Gatherings with Friends and Family: Never   Attends Religious Services: 1 to 4 times per year  Active Member of Clubs or Organizations: No   Attends Archivist Meetings: Never   Marital Status: Widowed   Past Surgical History:  Procedure Laterality Date   ABDOMINAL HYSTERECTOMY     ANKLE SURGERY Left    x 2   ANTERIOR CERVICAL DECOMP/DISCECTOMY FUSION N/A 11/12/2017   Procedure: Cervical seven to Thoracic one  Anterior cervical decompression/discectomy/fusion with removal of old plate;  Surgeon: [name], MD;  Location: [facility];  Service: Neurosurgery;  Laterality: N/A;   BACK SURGERY     T12-L1 fusion, Anterior Cervical fusion   CARDIAC CATHETERIZATION N/A 02/16/2015   Procedure: Left Heart Cath and Coronary Angiography;  Surgeon: Sherren Mocha, MD;  Location: [facility];  Service: Cardiovascular;  Laterality: N/A;   CHOLECYSTECTOMY     COLONOSCOPY     ESOPHAGOGASTRODUODENOSCOPY  12/21/2010   Procedure: ESOPHAGOGASTRODUODENOSCOPY (EGD);  Surgeon: Landry Dyke, MD;  Location: [facility];  Service: Endoscopy;  Laterality: N/A;   EXCISION MORTON'S NEUROMA Right    EYE SURGERY Bilateral    Cataract   FRACTURE SURGERY     ankle x 2    KNEE SURGERY Right    x 3- arthroscopy   NECK SURGERY     POSTERIOR CERVICAL FUSION/FORAMINOTOMY N/A 01/22/2018   Procedure: Cervical six-seven Posterior Decompression with Posterior Fixation Cervical Five to Thoracic Two;  Surgeon:  [name], MD;  Location: [facility];  Service: Neurosurgery;  Laterality: N/A;  Cervical 6-7 Posteror decompression with posterior fixation Cervical 5 to Thoracic 2   ROTATOR CUFF REPAIR Right    TONSILLECTOMY     TOTAL KNEE ARTHROPLASTY Left 04/10/2017   Procedure: LEFT TOTAL KNEE ARTHROPLASTY;  Surgeon: Paralee Cancel, MD;  Location: [facility];  Service: Orthopedics;  Laterality: Left;  70 mins   UPPER GASTROINTESTINAL ENDOSCOPY     WRIST SURGERY Left    fracture   Past Medical History:  Diagnosis Date   Anemia    during 1st pregnancy   Anxiety    Arthritis    Cancer ([place])    Basal - face   Complication of anesthesia    Depression    Diarrhea, functional    Dysrhythmia    Paroysmal Atrial Tachycardia   Essential hypertension    Fibromyalgia    GERD (gastroesophageal reflux disease)    H/O acute pancreatitis    Headache    History of hiatal hernia    Hyperlipemia    Neuropathy    Osteoporosis    Panic attacks    Paralysis ([place]) 01/06/2019   hands   PAT (paroxysmal atrial tachycardia) (HCC)    Pneumonia    PONV (postoperative nausea and vomiting)    PSVT (paroxysmal supraventricular tachycardia) (HCC)    Reflux    Restless leg syndrome    Spinal cord compression (HCC)    Type 2 diabetes mellitus (HCC)    Type II   Vertigo    not current   BP 121/77 (BP Location: Right Arm)   Pulse 64   Temp (!) 97.5 F (36.4 C) (Oral)   Ht '5\' 4"'$  (1.626 m)   Wt 205 lb 12.8 oz (93.4 kg)   SpO2 98%   BMI 35.33 kg/m   Opioid Risk Score:   Fall Risk Score:  `1  Depression screen PHQ 2/9  Depression screen [facility] 2/9 12/12/2019 11/11/2019 04/08/2019 09/28/2018 08/23/2018 10/19/2017 09/05/2017  Decreased Interest '3 1 3 '$ 0 0 1 0  Down, Depressed, Hopeless '3 1 3 1 '$ 0 2 0  PHQ - 2 Score '6 2 6 1 '$ 0 3 0  Altered sleeping - 0 3 - - 2 -  Tired, decreased energy - 1 3 - - 3 -  Change in appetite - 1 0 - - 3 -  Feeling bad or failure about yourself  - 1 3 - - 3 -  Trouble concentrating - 0 3 - - 2  -  Moving slowly or fidgety/restless - - 2 - - 1 -  Suicidal thoughts - 1 3 - - 3 -  PHQ-9 Score - 6 23 - - 20 -  Difficult doing work/chores - Somewhat difficult - - - Not difficult at all -  Some recent data might be hidden     Review of Systems     Objective:   Physical Exam Gen: no distress, normal appearing HEENT: oral mucosa pink and moist, NCAT Cardio: Reg rate Chest: normal effort, normal rate of breathing Abd: soft, non-distended Ext: no edema Psych: pleasant, normal affect Skin: intact Neuro: Impaired memory Musculoskeletal: Contractures of both hands, AFO on left foot    Assessment & Plan:  Leah. 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    Spasticity: continue daily stretching.   -Continue use of splint at night and exercises to maintain range of motion in right hand.   Fibromyalgia and diabetic peripheral neuropathy -Continue Cymbalta '30mg'$   -Continue Lyrica to '50mg'$  TID, provided refill.  -Recommended intermittent fasting to improve insulin sensitivity. Currently eats breakfast at 8am and dinner at 5:30pm. Recommended pushing breakfast to 8:15am -Recommended no added sugars and minimal bread, pasta, rice.  -Continue physical therapy in January for balance deficits  Nocturia:  -Provided referral to urology for urodynamic studies to determine etiology of nocturia.   Constipation: -Constipation symptoms worsened with narcotic use. She has been using dulcolax suppository as needed and she has been comfortable with this, may continue this.  -Also recommended high fiber diet.  -improving -continue metamucil.  -BM every 2-3 days now  -Provided list of following foods that help with constipation and highlighted a few: 1) prunes- contain high amounts of fiber.  2) apples-  has a form of dietary fiber called pectin that accelerates stool movement and increases beneficial gut bacteria 3) pears- in addition to fiber, also high in fructose and sorbitol which have laxative effect 4) figs- contain an enzyme ficin which helps to speed colonic transit 5) kiwis- contain an enzyme actinidin that improves gut motility and reduces constipation 6) oranges- rich in pectin (like apples) 7) grapefruits- contain a flavanol naringenin which has a laxative effect 8) vegetables- rich in fiber and also great sources of folate, vitamin C, and K 9) artichoke- high in inulin, prebiotic great for the microbiome 10) chicory- increases stool frequency and softness (can be added to coffee) 11) rhubarb- laxative effect 12) sweet potato- high fiber 13) beans, peas, and lentils- contain both soluble and insoluble fiber 14) chia seeds- improves intestinal health and gut flora 15) flaxseeds- laxative effect 16) whole grain rye bread- high in fiber 17) oat bran- high in soluble and insoluble fiber 18) kefir- softens stools -recommended to try at least one of these foods every day.  -drink 6-8 glasses of water per day -walk regularly, especially after meals.    Right knee pain 2/2 OA: Viscosupplementation offered no benefits.  -  Discussed risks and benefits of surgery -Recommended blue emu oil -Discussed current symptoms of pain and history of pain.  -Discussed benefits of exercise in reducing pain. -Sent referral for right knee brace to [facility] with clinic notes.  -recommended low dose naltrexone and she is interested in this.  -Discussed following foods that may reduce pain: 1) Ginger (especially studied for arthritis)- reduce leukotriene production to decrease inflammation 2) Blueberries- high in phytonutrients that decrease inflammation 3) Salmon- marine omega-3s reduce joint swelling and pain 4) Pumpkin seeds- reduce inflammation 5) dark chocolate- reduces inflammation 6)  turmeric- reduces inflammation 7) tart cherries - reduce pain and stiffness 8) extra virgin olive oil - its compound olecanthal helps to block prostaglandins  9) chili peppers- can be eaten or applied topically via capsaicin 10) mint- helpful for headache, muscle aches, joint pain, and itching 11) garlic- reduces inflammation  Link to further information on diet for chronic pain: http://www.randall.com/ -continue steroid dose pack, helping  Left foot becomes purplish, tingling: -referred to vascular for eval for PVD  Right sided adhesive capsulitis: -recommended exercise to preserve range of motion  Diabetic peripheral neuropathy: -she has neuropathy of both feet -Discussed Qutenza as an option for neuropathic pain control. Discussed that this is a capsaicin patch, stronger than capsaicin cream. Discussed that it is currently approved for diabetic peripheral neuropathy and post-herpetic neuralgia, but that it has also shown benefit in treating other forms of neuropathy. Provided patient with link to site to learn more about the patch: [employer].fr. Discussed that the patch would be placed in office and benefits usually last 3 months. Discussed that unintended exposure to capsaicin can cause severe irritation of eyes, mucous membranes, respiratory tract, and skin, but that Qutenza is a local treatment and does not have the systemic side effects of other nerve medications. Discussed that there may be pain, itching, erythema, and decreased sensory function associated with the application of Qutenza. Side effects usually subside within 1 week. A cold pack of analgesic medications can help with these side effects. Blood pressure can also be increased due to pain associated with administration of the patch.  4 patches of Qutenza was applied to the area of pain. Ice packs were applied during the procedure to ensure patient  comfort. Blood pressure was monitored every 15 minutes. The patient tolerated the procedure well. Post-procedure instructions were given and follow-up has been scheduled.  [place] [id number] Recommended 1 glass water with 1 TB apple cider vinegar before meals to reduce CBG spike, has additional health benefits, drink with straw to protect enamel.    40 minutes spent in application and removal of Qutenza, discussion of risks and benefits, discussion of insulin regimen and symptoms, review of her recent HgbA1c with her, discussion of response to steroids for right knee OA.

## 2020-09-08 DIAGNOSIS — M48062 Spinal stenosis, lumbar region with neurogenic claudication: Secondary | ICD-10-CM | POA: Diagnosis not present

## 2020-09-08 DIAGNOSIS — R262 Difficulty in walking, not elsewhere classified: Secondary | ICD-10-CM | POA: Diagnosis not present

## 2020-09-10 ENCOUNTER — Telehealth: Payer: Self-pay | Admitting: *Deleted

## 2020-09-10 NOTE — Telephone Encounter (Signed)
Patient reports she felt good after visit on 07/25. She said she was at her sewing table, sh was happy and just tickled she was able to do things.  $days later she is in excruciating pain and is asking for something to help her with her pain.  She mentioned cortisone.

## 2020-09-13 NOTE — Telephone Encounter (Signed)
[name] reports that pain is in her joints (and some muscles) and would like to do the oral steroids again.

## 2020-09-14 ENCOUNTER — Other Ambulatory Visit: Payer: Self-pay | Admitting: Physical Medicine and Rehabilitation

## 2020-09-14 MED ORDER — METHYLPREDNISOLONE 4 MG PO TBPK: ORAL_TABLET | ORAL | 0 refills | Status: DC

## 2020-09-15 DIAGNOSIS — M48062 Spinal stenosis, lumbar region with neurogenic claudication: Secondary | ICD-10-CM | POA: Diagnosis not present

## 2020-09-15 DIAGNOSIS — R262 Difficulty in walking, not elsewhere classified: Secondary | ICD-10-CM | POA: Diagnosis not present

## 2020-09-22 DIAGNOSIS — R262 Difficulty in walking, not elsewhere classified: Secondary | ICD-10-CM | POA: Diagnosis not present

## 2020-09-22 DIAGNOSIS — M48062 Spinal stenosis, lumbar region with neurogenic claudication: Secondary | ICD-10-CM | POA: Diagnosis not present

## 2020-09-27 ENCOUNTER — Other Ambulatory Visit: Payer: Self-pay | Admitting: Family Medicine

## 2020-09-29 DIAGNOSIS — E119 Type 2 diabetes mellitus without complications: Secondary | ICD-10-CM | POA: Diagnosis not present

## 2020-09-29 DIAGNOSIS — E1149 Type 2 diabetes mellitus with other diabetic neurological complication: Secondary | ICD-10-CM | POA: Diagnosis not present

## 2020-10-03 ENCOUNTER — Other Ambulatory Visit: Payer: Self-pay | Admitting: Family Medicine

## 2020-10-03 DIAGNOSIS — G2581 Restless legs syndrome: Secondary | ICD-10-CM

## 2020-10-04 ENCOUNTER — Other Ambulatory Visit: Payer: Self-pay

## 2020-10-04 ENCOUNTER — Encounter: Payer: PPO | Attending: Physical Medicine and Rehabilitation | Admitting: Physical Medicine and Rehabilitation

## 2020-10-04 ENCOUNTER — Telehealth: Payer: Self-pay

## 2020-10-04 DIAGNOSIS — M1711 Unilateral primary osteoarthritis, right knee: Secondary | ICD-10-CM | POA: Diagnosis not present

## 2020-10-04 NOTE — Progress Notes (Signed)
Subjective:    Patient ID: [name], female    DOB: 09-15-1940, 80 y.o.   MRN: [id number]  HPI  An audio/video tele-health visit is felt to be the most appropriate encounter for this patient at this time. This is a follow up tele-visit via phone. The patient is at home. MD is at office.    [name] is a 80 year old woman who presents for follow-up of fibromyalgia, primary right knee OA, and diabetic peripheral neuropathy.   1) Fibromyalgia: -I spoke with daughter prior to this appointment and she mentioned that Lyrica has been helping -her pain levels have been high recently.  2) Primary OA of right knee: -She has been wearing a brace for her left knee and this has really helped. She received it from Dr. Alvan Dame at [facility]. -She would like a similar brace for her right knee -She has noted buckling when she walks -She would like to avoid surgery if possible. -she takes 2 tylenol in the morning and this provides relief after about 30 minutes -Has not been tested for RF recently it appears -has been to rheumatology and felt they were in a hurry. -Pain is worst in the morning when she gets up.  -does not want rheumatology referral.  -had steroid injection Friday without benefit.  -has been worsening -she has been wearing her brace -does not want to pursue knee replacement -the steroid pack has greatly helped but pain continues to be severe -he does not want to pursue knee replacement given the problems she had with her left knee. -the pain is grinding and horrible  3) Bilateral diabetic peripheral neuropathy: -lyrica does help -would like to proceed with Qutenza, this helped a lot.  -has neuropathy on soles of both feet as well as in toes  Prior history: Mrs. [name] is a 80 year old woman who presents for follow-up of pain in her right anterior thigh as well as right hand flexor spasticity. She takes 2 tylenol in the morning and it takes about an hour and half for this to  work. It usually lasts her until the ned of the day. She has also been having neuropathy which causes her to lose her balance. She asks me about a device that she saw on TV. She also plans to start physical therapy in January. She had an exacerbation of her pain since last visit and saw Dr. [name] and had a repeat MRI- there was nothing acute that warranted surgery. During this flare she took narcotics and did not eat much. During that time she was more constipated.   Prior history Mrs. 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 Her daughter accompanies her today.    She has a history of severe cervical, thoracic, and lumbar spinal stenosis s/p two ACDFs and thoracic decompression, fusion, and posterior lateral arthrodesis with pedicle screws.    She has been having severe right sided flexor spasticity since then causing her fingers to curve inward to her palms. She has been using hand splints and exercising her hands to keep them mobile. This is very disruptive to her as she would love to be able to return to cooking and knitting, activities that she loves. She does not have other areas of spasticity. I have administered Botox to her right hand twice- the first time there was minimal benefit and the second time it made her arm weaker which  lessened her ability to hold her walker. She prefers not to try the Botox again. She has been able to open her hand more but she has no functional improvement.   She ambulates using a walker and has had no recent falls. She is currently receiving PT for her right handed tone.    Her pain is neuropathic in nature and present in both hands and feet. She has uncontrolled diabetes and her insulin dose was recently increased.   She also has fibromyalgia and takes Cymbalta '30mg'$  which helps. I had started her on Lyrica as well and this did provide relief but she did not like that  it made her sleepy during the day. She would like to increase dose to '25mg'$  BID and currently has enough medication.   She has been continuing to take Meloxicam '15mg'$  daily.   Her right knee pain has recently flared. She has swelling but no erythema. She has received steroid injections for arthritis in the past. She would be interested in viscosupplementation.   She has been urinating very frequently at night and this is very bothersome to her. She is on Myrbetriq. She has not seen a urologist recently. In the past she saw a urologist at [facility] and had a good experience there. Would like to return there. Has never had urodynamic studies in the past.   Pain Inventory Average Pain 7 Pain Right Now 1 My pain is intermittent, burning and aching  In the last 24 hours, has pain interfered with the following? General activity 5 Relation with others 1 Enjoyment of life 0 What TIME of day is your pain at its worst? morning Sleep (in general) Good  Pain is worse with: walking, bending, standing and some activites Pain improves with: rest and medication Relief from Meds: 7      Family History  Problem Relation Age of Onset   Heart failure Mother    Hyperlipidemia Mother    Lung cancer Father        Lymphoma   Hyperlipidemia Maternal Grandmother    Peripheral vascular disease Maternal Grandmother    Social History   Socioeconomic History   Marital status: Widowed    Spouse name: [name]   Number of children: 2   Years of education: Not on file   Highest education level: Not on file  Occupational History   Occupation: Retired  Tobacco Use   Smoking status: Former   Smokeless tobacco: Never   Tobacco comments:    smoke 1 cigarette every few weeks when the [place] lady came by.  Vaping Use   Vaping Use: Never used  Substance and Sexual Activity   Alcohol use: No   Drug use: No   Sexual activity: Not on file  Other Topics Concern   Not on file  Social History  Narrative   Lives home alone.  Daughter [name] with her today.   Independent of ADLs.  Education HS.  Widowed.     Social Determinants of Health   Financial Resource Strain: Low Risk    Difficulty of Paying Living Expenses: Not hard at all  Food Insecurity: No Food Insecurity   Worried About Charity fundraiser in the Last Year: Never true   [address] in the Last Year: Never true  Transportation Needs: No Transportation Needs   Lack of Transportation (Medical): No   Lack of Transportation (Non-Medical): No  Physical Activity: Inactive   Days of Exercise per Week: 0 days  Minutes of Exercise per Session: 0 min  Stress: Stress Concern Present   Feeling of Stress : Rather much  Social Connections: Moderately Isolated   Frequency of Communication with Friends and Family: More than three times a week   Frequency of Social Gatherings with Friends and Family: Never   Attends Religious Services: 1 to 4 times per year   Active Member of [employer] or Organizations: No   Attends Archivist Meetings: Never   Marital Status: Widowed   Past Surgical History:  Procedure Laterality Date   ABDOMINAL HYSTERECTOMY     ANKLE SURGERY Left    x 2   ANTERIOR CERVICAL DECOMP/DISCECTOMY FUSION N/A 11/12/2017   Procedure: Cervical seven to Thoracic one  Anterior cervical decompression/discectomy/fusion with removal of old plate;  Surgeon: [name], MD;  Location: [facility];  Service: Neurosurgery;  Laterality: N/A;   BACK SURGERY     T12-L1 fusion, Anterior Cervical fusion   CARDIAC CATHETERIZATION N/A 02/16/2015   Procedure: Left Heart Cath and Coronary Angiography;  Surgeon: Sherren Mocha, MD;  Location: [facility];  Service: Cardiovascular;  Laterality: N/A;   CHOLECYSTECTOMY     COLONOSCOPY     ESOPHAGOGASTRODUODENOSCOPY  12/21/2010   Procedure: ESOPHAGOGASTRODUODENOSCOPY (EGD);  Surgeon: Landry Dyke, MD;  Location: [facility];  Service: Endoscopy;  Laterality: N/A;    EXCISION MORTON'S NEUROMA Right    EYE SURGERY Bilateral    Cataract   FRACTURE SURGERY     ankle x 2    KNEE SURGERY Right    x 3- arthroscopy   NECK SURGERY     POSTERIOR CERVICAL FUSION/FORAMINOTOMY N/A 01/22/2018   Procedure: Cervical six-seven Posterior Decompression with Posterior Fixation Cervical Five to Thoracic Two;  Surgeon: [name], MD;  Location: [facility];  Service: Neurosurgery;  Laterality: N/A;  Cervical 6-7 Posteror decompression with posterior fixation Cervical 5 to Thoracic 2   ROTATOR CUFF REPAIR Right    TONSILLECTOMY     TOTAL KNEE ARTHROPLASTY Left 04/10/2017   Procedure: LEFT TOTAL KNEE ARTHROPLASTY;  Surgeon: Paralee Cancel, MD;  Location: [facility];  Service: Orthopedics;  Laterality: Left;  70 mins   UPPER GASTROINTESTINAL ENDOSCOPY     WRIST SURGERY Left    fracture   Past Medical History:  Diagnosis Date   Anemia    during 1st pregnancy   Anxiety    Arthritis    Cancer ([place])    Basal - face   Complication of anesthesia    Depression    Diarrhea, functional    Dysrhythmia    Paroysmal Atrial Tachycardia   Essential hypertension    Fibromyalgia    GERD (gastroesophageal reflux disease)    H/O acute pancreatitis    Headache    History of hiatal hernia    Hyperlipemia    Neuropathy    Osteoporosis    Panic attacks    Paralysis ([place]) 01/06/2019   hands   PAT (paroxysmal atrial tachycardia) (HCC)    Pneumonia    PONV (postoperative nausea and vomiting)    PSVT (paroxysmal supraventricular tachycardia) (HCC)    Reflux    Restless leg syndrome    Spinal cord compression (HCC)    Type 2 diabetes mellitus (HCC)    Type II   Vertigo    not current   There were no vitals taken for this visit.  Opioid Risk Score:   Fall Risk Score:  `1  Depression screen PHQ 2/9  Depression screen [facility] 2/9 12/12/2019  11/11/2019 04/08/2019 09/28/2018 08/23/2018 10/19/2017 09/05/2017  Decreased Interest '3 1 3 '$ 0 0 1 0  Down, Depressed, Hopeless '3 1 3 1 '$ 0 2 0  PHQ -  2 Score '6 2 6 1 '$ 0 3 0  Altered sleeping - 0 3 - - 2 -  Tired, decreased energy - 1 3 - - 3 -  Change in appetite - 1 0 - - 3 -  Feeling bad or failure about yourself  - 1 3 - - 3 -  Trouble concentrating - 0 3 - - 2 -  Moving slowly or fidgety/restless - - 2 - - 1 -  Suicidal thoughts - 1 3 - - 3 -  PHQ-9 Score - 6 23 - - 20 -  Difficult doing work/chores - Somewhat difficult - - - Not difficult at all -  Some recent data might be hidden     Review of Systems     Objective:   Physical Exam Not performed since patient was seen via phone.     Assessment & Plan:  Mrs. 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    Spasticity: continue daily stretching.   -Continue use of splint at night and exercises to maintain range of motion in right hand.   Fibromyalgia and diabetic peripheral neuropathy -Continue Cymbalta '30mg'$   -Continue Lyrica to '50mg'$  TID, provided refill.  -Recommended intermittent fasting to improve insulin sensitivity. Currently eats breakfast at 8am and dinner at 5:30pm. Recommended pushing breakfast to 8:15am -Recommended no added sugars and minimal bread, pasta, rice.  -Continue physical therapy in January for balance deficits  Nocturia:  -Provided referral to urology for urodynamic studies to determine etiology of nocturia.   Constipation: -Constipation symptoms worsened with narcotic use. She has been using dulcolax suppository as needed and she has been comfortable with this, may continue this.  -Also recommended high fiber diet.  -improving -continue metamucil.  -BM every 2-3 days now  -Provided list of following foods that help with constipation and highlighted a few: 1) prunes- contain high amounts of fiber.  2) apples- has a form  of dietary fiber called pectin that accelerates stool movement and increases beneficial gut bacteria 3) pears- in addition to fiber, also high in fructose and sorbitol which have laxative effect 4) figs- contain an enzyme ficin which helps to speed colonic transit 5) kiwis- contain an enzyme actinidin that improves gut motility and reduces constipation 6) oranges- rich in pectin (like apples) 7) grapefruits- contain a flavanol naringenin which has a laxative effect 8) vegetables- rich in fiber and also great sources of folate, vitamin C, and K 9) artichoke- high in inulin, prebiotic great for the microbiome 10) chicory- increases stool frequency and softness (can be added to coffee) 11) rhubarb- laxative effect 12) sweet potato- high fiber 13) beans, peas, and lentils- contain both soluble and insoluble fiber 14) chia seeds- improves intestinal health and gut flora 15) flaxseeds- laxative effect 16) whole grain rye bread- high in fiber 17) oat bran- high in soluble and insoluble fiber 18) kefir- softens stools -recommended to try at least one of these foods every day.  -drink 6-8 glasses of water per day -walk regularly, especially after meals.    Right knee pain 2/2 OA: Viscosupplementation offered no benefits.  -Discussed risks and  benefits of surgery -Recommended blue emu oil -Discussed current symptoms of pain and history of pain.  -Discussed benefits of exercise in reducing pain. -Sent referral for right knee brace to [facility] with clinic notes.  -recommended low dose naltrexone and she is interested in this.  -advised discussing surgery as an option with Dr. Alvan Dame -recommended borage oil and evening primrose oil -Discussed following foods that may reduce pain: 1) Ginger (especially studied for arthritis)- reduce leukotriene production to decrease inflammation 2) Blueberries- high in phytonutrients that decrease inflammation 3) Salmon- marine omega-3s reduce joint swelling and  pain 4) Pumpkin seeds- reduce inflammation 5) dark chocolate- reduces inflammation 6) turmeric- reduces inflammation 7) tart cherries - reduce pain and stiffness 8) extra virgin olive oil - its compound olecanthal helps to block prostaglandins  9) chili peppers- can be eaten or applied topically via capsaicin 10) mint- helpful for headache, muscle aches, joint pain, and itching 11) garlic- reduces inflammation  Link to further information on diet for chronic pain: http://www.randall.[email or web address]/ -continue steroid dose pack, helping  Left foot becomes purplish, tingling: -referred to vascular for eval for PVD  Right sided adhesive capsulitis: -recommended exercise to preserve range of motion  Diabetic peripheral neuropathy: -she has neuropathy of both feet -Discussed Qutenza as an option for neuropathic pain control. Discussed that this is a capsaicin patch, stronger than capsaicin cream. Discussed that it is currently approved for diabetic peripheral neuropathy and post-herpetic neuralgia, but that it has also shown benefit in treating other forms of neuropathy. Provided patient with link to site to learn more about the patch: [employer].fr. Discussed that the patch would be placed in office and benefits usually last 3 months. Discussed that unintended exposure to capsaicin can cause severe irritation of eyes, mucous membranes, respiratory tract, and skin, but that Qutenza is a local treatment and does not have the systemic side effects of other nerve medications. Discussed that there may be pain, itching, erythema, and decreased sensory function associated with the application of Qutenza. Side effects usually subside within 1 week. A cold pack of analgesic medications can help with these side effects. Blood pressure can also be increased due to pain associated with administration of the patch.  -responded well to  Qutenza Recommended 1 glass water with 1 TB apple cider vinegar before meals to reduce CBG spike, has additional health benefits, drink with straw to protect enamel.    6 minutes spent in discussion of her pain, what treatments she has tried thus far, recommended trying borage oil and evening primrose oil, recommended discussing knee replacement surgery with Dr. Alvan Dame

## 2020-10-04 NOTE — Telephone Encounter (Signed)
Refill request for Pregabalin

## 2020-10-05 ENCOUNTER — Telehealth: Payer: Self-pay | Admitting: Pharmacist

## 2020-10-05 NOTE — Chronic Care Management (AMB) (Signed)
Chronic Care Management Pharmacy Assistant   Name: [name]  MRN: [id number] DOB: 1940-09-18  Reason for Encounter: Disease State/ General Assessment Call.   Conditions to be addressed/monitored: HTN and DMII   Recent office visits:  None.    Recent consult visits:  10/04/20 [name] (Physical Medicine and Rehabilitation) -  video visit. seen for primary osteoarthritis of right knee.no medication changes. No follow up noted.  09/06/20 [name] (Physical Medicine and Rehabilitation) - seen for primary osteoarthritis of right knee.no medication changes. No follow up noted.  09/02/20 [name] (Physical Medicine and Rehabilitation) - video visit. Seen for primary osteoarthritis. Patient started on methylprednisolone 26m taper pak. No follow up noted.  08/05/20 [facility]. Breast ultrasound.     Hospital visits:  None in previous 6 months  Medications: Outpatient Encounter Medications as of 10/05/2020  Medication Sig Note   acetaminophen (TYLENOL) 325 MG tablet Take 325 mg by mouth every 6 (six) hours as needed.    aspirin EC 81 MG tablet Take 81 mg by mouth at bedtime.     bisacodyl (BISACODYL LAXATIVE) 10 MG suppository Place 1 suppository (10 mg total) rectally as needed for moderate constipation.    Calcium Carb-Cholecalciferol 600-200 MG-UNIT TABS Take 1 tablet by mouth in the morning and at bedtime.     carbamide peroxide (DEBROX) 6.5 % OTIC solution Place 5 drops into the left ear 2 (two) times daily. 11/11/2019: As needed   Continuous Blood Gluc Receiver (DEXCOM G6 RECEIVER) DEVI 1 Device by Does not apply route daily.    Continuous Blood Gluc Sensor (DEXCOM G6 SENSOR) MISC 1 kit by Does not apply route as directed.    Continuous Blood Gluc Transmit (DEXCOM G6 TRANSMITTER) MISC 1 kit by Does not apply route as directed.    Cyanocobalamin (VITAMIN B 12 PO) Take 1,000 mg by mouth daily.    digoxin (LANOXIN) 0.125 MG tablet TAKE 1  TABLET BY MOUTH DAILY. PLEASE KEEP UPCOMING APPT WITH DR. SMarlou PorchIN SEPT FOR ANYMORE REFILLS    docusate sodium (COLACE) 100 MG capsule Take 100 mg by mouth daily. twice a day    DULoxetine (CYMBALTA) 30 MG capsule TAKE 2 CAPSULES BY MOUTH EVERY DAY    ezetimibe (ZETIA) 10 MG tablet TAKE 1 TABLET BY MOUTH EVERY DAY    glucose blood test strip Use as instructed to test blood sugars 3 times daily DX.E11.42    insulin aspart (NOVOLOG FLEXPEN) 100 UNIT/ML FlexPen Inject 8 Units into the skin daily with breakfast AND 8 Units daily with lunch AND 10 Units daily with supper. Max daily 60 units per correction scale.    insulin glargine (LANTUS SOLOSTAR) 100 UNIT/ML Solostar Pen Inject 18 Units into the skin daily.    Insulin Pen Needle (BD PEN NEEDLE MICRO U/F) 32G X 6 MM MISC USE 4 TIMES A DAY NOVOLOG(3) AND LANTUS9(1)    meloxicam (MOBIC) 15 MG tablet TAKE 1 TABLET (15 MG TOTAL) BY MOUTH DAILY.    methylPREDNISolone (MEDROL DOSEPAK) 4 MG TBPK tablet 6 pills today, 5 tomorrow, 4 next day, 3 next day, 2 next day, 1 next day with meal    metoprolol succinate (TOPROL-XL) 25 MG 24 hr tablet TAKE 1 TABLET BY MOUTH DAILY. PLEASE KEEP UPCOMING APPT IN SEPT WITH DR. SMarlou PorchFOR ANYMORE REFILLS    Multiple Vitamins-Minerals (PRESERVISION AREDS 2 PO) Take 1 tablet by mouth 2 (two) times daily.     MYRBETRIQ 50 MG TB24  tablet TAKE 1 TABLET BY MOUTH EVERY DAY    omega-3 acid ethyl esters (LOVAZA) 1 g capsule Take 1 capsule (1 g total) by mouth 2 (two) times daily.    omeprazole (PRILOSEC) 20 MG capsule Take 1 capsule (20 mg total) by mouth daily.    pregabalin (LYRICA) 50 MG capsule Take 1 capsule (50 mg total) by mouth 3 (three) times daily.    PREMARIN vaginal cream Place vaginally.    ramipril (ALTACE) 10 MG capsule TAKE 1 CAPSULE (10 MG TOTAL) BY MOUTH EVERY EVENING.    RELION INSULIN SYRINGE 1ML/31G 31G X 5/16" 1 ML MISC Use as directed    rOPINIRole (REQUIP) 1 MG tablet TAKE 1 TABLET BY MOUTH EVERYDAY AT  BEDTIME    rosuvastatin (CRESTOR) 10 MG tablet TAKE 1 TABLET BY MOUTH EVERY DAY    verapamil (CALAN) 120 MG tablet Take 120 mg by mouth daily as needed (afib).  11/11/2019: As needed   verapamil (VERELAN PM) 240 MG 24 hr capsule TAKE 1 CAPSULE BY MOUTH EVERY DAY    VITAMIN D PO Take 10,000 Units by mouth daily. 10,000 in the am and 5,000 in the pm    No facility-administered encounter medications on file as of 10/05/2020.   Fill History: DULOXETINE HCL DR 30 MG CAP 09/01/2020 90   DIGOXIN 125 MCG TABLET 08/01/2020 90   EZETIMIBE 10 MG TABLET 07/20/2020 90   NOVOLOG 100 UNIT/ML FLEXPEN 05/31/2020 28   LANTUS SOLOSTAR 100 UNIT/ML 07/01/2020 90   BD UF MICRO PEN NEEDLE 6MMX32G 09/10/2020 25   MELOXICAM 15 MG TABLET 09/21/2020 30   METOPROLOL SUCC ER 25 MG TAB 07/16/2020 90   MYRBETRIQ ER 50 MG TABLET 09/06/2020 90   OMEGA-3 ETHYL ESTERS 1 GM CAP 09/03/2020 90   PREGABALIN 50 MG CAPSULE 09/05/2020 30   RAMIPRIL 10 MG CAPSULE 07/20/2020 90   ROSUVASTATIN CALCIUM 10MG TABLET 09/27/2020 90   VERAPAMIL ER 240 MG CAPSULE 08/02/2020 90   ROPINIROLE HCL 1MG TABLET 10/04/2020 90   Recent Relevant Labs: Lab Results  Component Value Date/Time   HGBA1C 7.2 (A) 03/09/2020 10:43 AM   HGBA1C 7.7 (A) 09/03/2019 10:42 AM   HGBA1C 7.6 (H) 11/12/2017 10:29 AM   HGBA1C 6.7 (H) 09/04/2017 12:12 PM   HGBA1C 7.7 01/20/2016 12:00 AM   MICROALBUR 23.9 (H) 09/03/2019 11:09 AM   MICROALBUR 26.1 (H) 01/12/2017 11:42 AM    Kidney Function Lab Results  Component Value Date/Time   CREATININE 1.24 (H) 09/05/2019 02:10 PM   CREATININE 1.14 09/03/2019 11:09 AM   CREATININE 0.89 01/08/2019 12:19 PM   CREATININE 0.96 (H) 10/19/2017 04:02 PM   GFR 46.21 (L) 09/03/2019 11:09 AM   GFRNONAA >60 01/08/2019 12:19 PM   GFRAA >60 01/08/2019 12:19 PM    Current antihyperglycemic regimen:  Insulin aspart - Inject 8 Units into the skin daily with breakfast AND 8 Units daily with lunch AND 10 Units daily  with supper. Max daily 60 units per correction scale. Insulin glargine - Inject 18 Units into the skin daily  What recent interventions/DTPs have been made to improve glycemic control:  None. Have there been any recent hospitalizations or ED visits since last visit with CPP? No Patient hypoglycemic symptoms, including  Patient hyperglycemic symptoms, including  How often are you checking your blood sugar?  What are your blood sugars ranging?  Fasting:  Before meals:  After meals:  Bedtime:  During the week, how often does your blood glucose drop below 70?  Are you checking your feet daily/regularly?   Adherence Review: Is the patient currently on a STATIN medication? Yes Is the patient currently on ACE/ARB medication? Yes Does the patient have >5 day gap between last estimated fill dates? No   Reviewed chart prior to disease state call. Spoke with patient regarding BP  Recent Office Vitals: BP Readings from Last 3 Encounters:  09/06/20 121/77  06/30/20 (!) 147/76  04/26/20 (!) 150/84   Pulse Readings from Last 3 Encounters:  09/06/20 64  06/30/20 64  06/15/20 71    Wt Readings from Last 3 Encounters:  09/06/20 205 lb 12.8 oz (93.4 kg)  06/30/20 204 lb (92.5 kg)  06/15/20 202 lb (91.6 kg)     Kidney Function Lab Results  Component Value Date/Time   CREATININE 1.24 (H) 09/05/2019 02:10 PM   CREATININE 1.14 09/03/2019 11:09 AM   CREATININE 0.89 01/08/2019 12:19 PM   CREATININE 0.96 (H) 10/19/2017 04:02 PM   GFR 46.21 (L) 09/03/2019 11:09 AM   GFRNONAA >60 01/08/2019 12:19 PM   GFRAA >60 01/08/2019 12:19 PM    BMP Latest Ref Rng & Units 09/05/2019 09/03/2019 01/08/2019  Glucose 65 - 99 mg/dL 202(H) 152(H) 151(H)  BUN 7 - 25 mg/dL 30(H) 27(H) 18  Creatinine 0.60 - 0.93 mg/dL 1.24(H) 1.14 0.89  BUN/Creat Ratio 6 - 22 (calc) 24(H) - -  Sodium 135 - 146 mmol/L 133(L) 135 135  Potassium 3.5 - 5.3 mmol/L 4.4 4.5 4.4  Chloride 98 - 110 mmol/L 101 101 103  CO2 20 - 32  mmol/L 23 26 23   Calcium 8.6 - 10.4 mg/dL 10.0 9.9 9.4    Current antihypertensive regimen:  Metoprolol 8m - take 1 tablet by mouth daily. Ramipril 166m- take 1 capsule by mouth every evening. Verapamil 12069m take 1 tablet by mouth as needed.  How often are you checking your Blood Pressure?  Current home BP readings:  What recent interventions/DTPs have been made by any provider to improve Blood Pressure control since last CPP Visit: None. Any recent hospitalizations or ED visits since last visit with CPP? No What diet changes have been made to improve Blood Pressure Control?   What exercise is being done to improve your Blood Pressure Control?   Adherence Review: Is the patient currently on ACE/ARB medication? Yes Does the patient have >5 day gap between last estimated fill dates? No  Multiple unsuccessful attempts to reach patient by phone.  Care Gaps:  AWV - scheduled for 11/16/20 Ophthalmology exam - overdue since 05/05/20 Hemoglobin A1C - overdue since 09/06/20 Flu vaccine - due  Star Rating Drugs:  Ramipril 65m27mlast [facility]20[facility]20 [facility] Rosuvastatin 65mg22mast filled on 09/27/20 [facility] Verapamil 120mg 74mst filled on 08/02/20 [facility]  [facility] [phone]

## 2020-10-06 MED ORDER — PREGABALIN 50 MG PO CAPS: 50.0000 mg | ORAL_CAPSULE | Freq: Three times a day (TID) | ORAL | 1 refills | Status: DC

## 2020-10-06 NOTE — Telephone Encounter (Signed)
I called in refill because they have called 3 x. [facility].

## 2020-10-07 NOTE — Telephone Encounter (Cosign Needed)
2nd attempt

## 2020-10-08 NOTE — Telephone Encounter (Cosign Needed)
3rd attempt

## 2020-10-14 ENCOUNTER — Other Ambulatory Visit: Payer: Self-pay | Admitting: Cardiology

## 2020-10-14 DIAGNOSIS — I10 Essential (primary) hypertension: Secondary | ICD-10-CM

## 2020-10-25 ENCOUNTER — Other Ambulatory Visit: Payer: Self-pay | Admitting: Physical Medicine and Rehabilitation

## 2020-10-25 ENCOUNTER — Encounter: Payer: Self-pay | Admitting: Internal Medicine

## 2020-10-25 MED ORDER — LANTUS SOLOSTAR 100 UNIT/ML ~~LOC~~ SOPN: 22.0000 [IU] | PEN_INJECTOR | Freq: Every day | SUBCUTANEOUS | 3 refills | Status: DC

## 2020-10-25 MED ORDER — PREGABALIN 75 MG PO CAPS: 75.0000 mg | ORAL_CAPSULE | Freq: Three times a day (TID) | ORAL | 2 refills | Status: DC

## 2020-10-25 MED ORDER — NOVOLOG FLEXPEN 100 UNIT/ML ~~LOC~~ SOPN: PEN_INJECTOR | SUBCUTANEOUS | 1 refills | Status: DC

## 2020-10-25 NOTE — Telephone Encounter (Signed)
Report printed per provider request .

## 2020-10-29 DIAGNOSIS — E1149 Type 2 diabetes mellitus with other diabetic neurological complication: Secondary | ICD-10-CM | POA: Diagnosis not present

## 2020-10-29 DIAGNOSIS — E119 Type 2 diabetes mellitus without complications: Secondary | ICD-10-CM | POA: Diagnosis not present

## 2020-10-31 ENCOUNTER — Other Ambulatory Visit: Payer: Self-pay | Admitting: Physical Medicine and Rehabilitation

## 2020-11-03 ENCOUNTER — Encounter: Payer: Self-pay | Admitting: Internal Medicine

## 2020-11-03 NOTE — Progress Notes (Signed)
10/25/20 

## 2020-11-05 ENCOUNTER — Other Ambulatory Visit: Payer: Self-pay | Admitting: Cardiology

## 2020-11-05 ENCOUNTER — Other Ambulatory Visit: Payer: Self-pay | Admitting: Family Medicine

## 2020-11-09 ENCOUNTER — Telehealth: Payer: Self-pay | Admitting: Pharmacist

## 2020-11-09 NOTE — Chronic Care Management (AMB) (Signed)
Chronic Care Management Pharmacy Assistant   Name: [name]  MRN: [id number] DOB: 05/22/1940   Reason for Encounter: Disease State / Hypertension and Diabetes Assessment Call   Conditions to be addressed/monitored: HTN and DMII   Recent office visits:  None  Recent consult visits:  None  Hospital visits:  None in previous 6 months  Medications: Outpatient Encounter Medications as of 11/09/2020  Medication Sig Note   acetaminophen (TYLENOL) 325 MG tablet Take 325 mg by mouth every 6 (six) hours as needed.    aspirin EC 81 MG tablet Take 81 mg by mouth at bedtime.     bisacodyl (BISACODYL LAXATIVE) 10 MG suppository Place 1 suppository (10 mg total) rectally as needed for moderate constipation.    Calcium Carb-Cholecalciferol 600-200 MG-UNIT TABS Take 1 tablet by mouth in the morning and at bedtime.     carbamide peroxide (DEBROX) 6.5 % OTIC solution Place 5 drops into the left ear 2 (two) times daily. 11/11/2019: As needed   Continuous Blood Gluc Receiver (DEXCOM G6 RECEIVER) DEVI 1 Device by Does not apply route daily.    Continuous Blood Gluc Sensor (DEXCOM G6 SENSOR) MISC 1 kit by Does not apply route as directed.    Continuous Blood Gluc Transmit (DEXCOM G6 TRANSMITTER) MISC 1 kit by Does not apply route as directed.    Cyanocobalamin (VITAMIN B 12 PO) Take 1,000 mg by mouth daily.    digoxin (LANOXIN) 0.125 MG tablet Take 1 tablet (0.125 mg total) by mouth daily.    docusate sodium (COLACE) 100 MG capsule Take 100 mg by mouth daily. twice a day    DULoxetine (CYMBALTA) 30 MG capsule TAKE 2 CAPSULES BY MOUTH EVERY DAY    ezetimibe (ZETIA) 10 MG tablet TAKE 1 TABLET BY MOUTH EVERY DAY    glucose blood test strip Use as instructed to test blood sugars 3 times daily DX.E11.42    insulin aspart (NOVOLOG FLEXPEN) 100 UNIT/ML FlexPen Inject 10 Units into the skin daily with breakfast AND 10 Units daily with lunch AND 12 Units daily with supper. Max daily 60 units per  correction scale.    insulin glargine (LANTUS SOLOSTAR) 100 UNIT/ML Solostar Pen Inject 22 Units into the skin daily.    Insulin Pen Needle (BD PEN NEEDLE MICRO U/F) 32G X 6 MM MISC USE 4 TIMES A DAY NOVOLOG(3) AND LANTUS9(1)    meloxicam (MOBIC) 15 MG tablet TAKE 1 TABLET (15 MG TOTAL) BY MOUTH DAILY.    methylPREDNISolone (MEDROL DOSEPAK) 4 MG TBPK tablet 6 pills today, 5 tomorrow, 4 next day, 3 next day, 2 next day, 1 next day with meal    metoprolol succinate (TOPROL-XL) 25 MG 24 hr tablet Take 1 tablet (25 mg total) by mouth daily. Please make overdue appt with Dr. Marlou Porch before anymore refills. Thank you 1st attempt    Multiple Vitamins-Minerals (PRESERVISION AREDS 2 PO) Take 1 tablet by mouth 2 (two) times daily.     MYRBETRIQ 50 MG TB24 tablet TAKE 1 TABLET BY MOUTH EVERY DAY    omega-3 acid ethyl esters (LOVAZA) 1 g capsule Take 1 capsule (1 g total) by mouth 2 (two) times daily.    omeprazole (PRILOSEC) 20 MG capsule Take 1 capsule (20 mg total) by mouth daily.    pregabalin (LYRICA) 75 MG capsule Take 1 capsule (75 mg total) by mouth 3 (three) times daily.    PREMARIN vaginal cream Place vaginally.    ramipril (ALTACE) 10 MG capsule TAKE  1 CAPSULE (10 MG TOTAL) BY MOUTH EVERY EVENING.    RELION INSULIN SYRINGE 1ML/31G 31G X 5/16" 1 ML MISC Use as directed    rOPINIRole (REQUIP) 1 MG tablet TAKE 1 TABLET BY MOUTH EVERYDAY AT BEDTIME    rosuvastatin (CRESTOR) 10 MG tablet TAKE 1 TABLET BY MOUTH EVERY DAY    verapamil (CALAN) 120 MG tablet Take 120 mg by mouth daily as needed (afib).  11/11/2019: As needed   verapamil (VERELAN PM) 240 MG 24 hr capsule TAKE 1 CAPSULE BY MOUTH EVERY DAY    VITAMIN D PO Take 10,000 Units by mouth daily. 10,000 in the am and 5,000 in the pm    No facility-administered encounter medications on file as of 11/09/2020.   Fill History:  DULOXETINE HCL DR 30 MG CAP 09/01/2020 90   DIGOXIN 0.125MG TABLET 11/05/2020 90   EZETIMIBE 10 MG TABLET 10/18/2020 90    VASCEPA 1 GM CAPSULE 10/25/2020 30   LANTUS SOLOSTAR 100 UNIT/ML 10/09/2020 90   NOVOLOG 100 UNIT/ML FLEXPEN 10/25/2020 75   MELOXICAM 15 MG TABLET 11/03/2020 30   METOPROLOL SUCC ER 25 MG TAB 10/15/2020 30   MYRBETRIQ ER 50 MG TABLET 09/06/2020 90   PREGABALIN 75 MG CAPSULE 10/25/2020 30   RAMIPRIL 10 MG CAPSULE 10/18/2020 90   ROSUVASTATIN CALCIUM 10 MG TAB 09/27/2020 90   VERAPAMIL ER 240 MG CAPSULE 10/31/2020 90   ROPINIROLE HCL 1 MG TABLET 10/04/2020 90   Recent Relevant Labs: Lab Results  Component Value Date/Time   HGBA1C 7.2 (A) 03/09/2020 10:43 AM   HGBA1C 7.7 (A) 09/03/2019 10:42 AM   HGBA1C 7.6 (H) 11/12/2017 10:29 AM   HGBA1C 6.7 (H) 09/04/2017 12:12 PM   HGBA1C 7.7 01/20/2016 12:00 AM   MICROALBUR 23.9 (H) 09/03/2019 11:09 AM   MICROALBUR 26.1 (H) 01/12/2017 11:42 AM    Kidney Function Lab Results  Component Value Date/Time   CREATININE 1.24 (H) 09/05/2019 02:10 PM   CREATININE 1.14 09/03/2019 11:09 AM   CREATININE 0.89 01/08/2019 12:19 PM   CREATININE 0.96 (H) 10/19/2017 04:02 PM   GFR 46.21 (L) 09/03/2019 11:09 AM   GFRNONAA >60 01/08/2019 12:19 PM   GFRAA >60 01/08/2019 12:19 PM    Current antihyperglycemic regimen:  Novolog Flexpen- Inject 10 Units into the skin daily with breakfast AND 10 Units daily with lunch AND 12 Units daily with supper. Max daily 60 units per correction scale., Normal Lantus Solostar - Inject 220Units into the skin daily  What recent interventions/DTPs have been made to improve glycemic control:  None  Have there been any recent hospitalizations or ED visits since last visit with CPP? No  Patient denies hypoglycemic symptoms, including None  Patient denies hyperglycemic symptoms, including none  How often are you checking your blood sugar? Patient has a dexcom unit  What are your blood sugars ranging?  Patient has a dexcom unit, blood sugars vary fasting and non fasting between 105/230.  During the week, how  often does your blood glucose drop below 70? Never  Are you checking your feet daily/regularly? Yes  Adherence Review: Is the patient currently on a STATIN medication? Yes Is the patient currently on ACE/ARB medication? Yes Does the patient have >5 day gap between last estimated fill dates? No   Reviewed chart prior to disease state call. Spoke with patient daughter regarding BP  Recent Office Vitals: BP Readings from Last 3 Encounters:  09/06/20 121/77  06/30/20 (!) 147/76  04/26/20 (!) 150/84   Pulse  Readings from Last 3 Encounters:  09/06/20 64  06/30/20 64  06/15/20 71    Wt Readings from Last 3 Encounters:  09/06/20 205 lb 12.8 oz (93.4 kg)  06/30/20 204 lb (92.5 kg)  06/15/20 202 lb (91.6 kg)     Kidney Function Lab Results  Component Value Date/Time   CREATININE 1.24 (H) 09/05/2019 02:10 PM   CREATININE 1.14 09/03/2019 11:09 AM   CREATININE 0.89 01/08/2019 12:19 PM   CREATININE 0.96 (H) 10/19/2017 04:02 PM   GFR 46.21 (L) 09/03/2019 11:09 AM   GFRNONAA >60 01/08/2019 12:19 PM   GFRAA >60 01/08/2019 12:19 PM    BMP Latest Ref Rng & Units 09/05/2019 09/03/2019 01/08/2019  Glucose 65 - 99 mg/dL 202(H) 152(H) 151(H)  BUN 7 - 25 mg/dL 30(H) 27(H) 18  Creatinine 0.60 - 0.93 mg/dL 1.24(H) 1.14 0.89  BUN/Creat Ratio 6 - 22 (calc) 24(H) - -  Sodium 135 - 146 mmol/L 133(L) 135 135  Potassium 3.5 - 5.3 mmol/L 4.4 4.5 4.4  Chloride 98 - 110 mmol/L 101 101 103  CO2 20 - 32 mmol/L 23 26 23   Calcium 8.6 - 10.4 mg/dL 10.0 9.9 9.4    Current antihypertensive regimen:  Metoprolol 98m - take 1 tablet by mouth daily. Ramipril 15m- take 1 capsule by mouth every evening. Verapamil 12066m take 1 tablet by mouth as needed.   How often are you checking your Blood Pressure?  Not checking  Current home BP readings: Not checking  What recent interventions/DTPs have been made by any provider to improve Blood Pressure control since last CPP Visit: None  Any recent  hospitalizations or ED visits since last visit with CPP? No  What diet changes have been made to improve Blood Pressure Control?  Patient eats whatever she wants however, she eats very little carbohydrate and sugars.  What exercise is being done to improve your Blood Pressure Control?  Very little, patient is 2 years out from a neck fracture and walks with a walker, she does get up to walk around the kitchen table three times a day.  Adherence Review: Is the patient currently on ACE/ARB medication? Yes Does the patient have >5 day gap between last estimated fill dates? No  Notes:  Patient has also been started on Vascepa 2 grams twice daily. Follow up appointment with MadJeni Sallesheduled  Care Gaps:  AWV - scheduled for 11/16/20 Ophthalmology exam - overdue since 05/05/20 Hemoglobin A1C - overdue since 09/06/20 Flu vaccine - due   Star Rating Drugs:  Ramipril 69m58mlast filled on [facility]18/[facility] Rosuvastatin 69mg55mast filled on 09/27/20 [facility] Verapamil 120mg 82mst filled on 09/18/202290 DS [facility]   [phone]

## 2020-11-13 ENCOUNTER — Other Ambulatory Visit: Payer: Self-pay | Admitting: Cardiology

## 2020-11-13 DIAGNOSIS — I10 Essential (primary) hypertension: Secondary | ICD-10-CM

## 2020-11-16 ENCOUNTER — Other Ambulatory Visit: Payer: Self-pay

## 2020-11-16 ENCOUNTER — Ambulatory Visit (INDEPENDENT_AMBULATORY_CARE_PROVIDER_SITE_OTHER): Payer: PPO

## 2020-11-16 ENCOUNTER — Telehealth: Payer: Self-pay | Admitting: *Deleted

## 2020-11-16 VITALS — BP 134/82 | HR 79 | Temp 97.6°F | Wt 209.3 lb

## 2020-11-16 DIAGNOSIS — F339 Major depressive disorder, recurrent, unspecified: Secondary | ICD-10-CM

## 2020-11-16 DIAGNOSIS — Z0000 Encounter for general adult medical examination without abnormal findings: Secondary | ICD-10-CM

## 2020-11-16 NOTE — Progress Notes (Addendum)
Subjective:   [name] is a 80 y.o. female who presents for Medicare Annual (Subsequent) preventive examination.  Review of Systems     Cardiac Risk Factors include: advanced age (>77mn, >>59women);hypertension;diabetes mellitus;dyslipidemia;obesity (BMI >30kg/m2);sedentary lifestyle     Objective:    Today's Vitals   11/16/20 1005 11/16/20 1013  BP: 134/82   Pulse: 79   Temp: 97.6 F (36.4 C)   SpO2: 97%   Weight: 209 lb 4.8 oz (94.9 kg)   PainSc:  9    Body mass index is 35.93 kg/m.  Advanced Directives 11/16/2020 11/11/2019 01/06/2019 05/27/2018 04/11/2018 01/22/2018 01/22/2018  Does Patient Have a Medical Advance Directive? Yes Yes Yes Yes No No No  Type of [place] Healthcare Power of [place] Does patient want to make changes to medical advance directive? - - - - - - -  Copy of [place] Chart? No - copy requested No - copy requested No - copy requested Yes - validated most recent copy scanned in chart (See row information) - - -  Would patient like information on creating a medical advance directive? - - - - No - Patient declined No - Patient declined No - Patient declined  Pre-existing out of facility DNR order (yellow form or pink MOST form) - - - - - - -    Current Medications (verified) Outpatient Encounter Medications as of 11/16/2020  Medication Sig   acetaminophen (TYLENOL) 325 MG tablet Take 325 mg by mouth every 6 (six) hours as needed.   aspirin EC 81 MG tablet Take 81 mg by mouth at bedtime.    bisacodyl (BISACODYL LAXATIVE) 10 MG suppository Place 1 suppository (10 mg total) rectally as needed for moderate constipation.   Calcium Carb-Cholecalciferol 600-200 MG-UNIT TABS Take 1 tablet by mouth in the morning and at bedtime.    carbamide peroxide (DEBROX) 6.5 % OTIC solution Place 5 drops into the left ear 2 (two)  times daily.   Continuous Blood Gluc Receiver (DEXCOM G6 RECEIVER) DEVI 1 Device by Does not apply route daily.   Continuous Blood Gluc Sensor (DEXCOM G6 SENSOR) MISC 1 kit by Does not apply route as directed.   Continuous Blood Gluc Transmit (DEXCOM G6 TRANSMITTER) MISC 1 kit by Does not apply route as directed.   Cyanocobalamin (VITAMIN B 12 PO) Take 1,000 mg by mouth daily.   digoxin (LANOXIN) 0.125 MG tablet Take 1 tablet (0.125 mg total) by mouth daily.   docusate sodium (COLACE) 100 MG capsule Take 100 mg by mouth daily. twice a day   DULoxetine (CYMBALTA) 30 MG capsule TAKE 2 CAPSULES BY MOUTH EVERY DAY   ezetimibe (ZETIA) 10 MG tablet TAKE 1 TABLET BY MOUTH EVERY DAY   glucose blood test strip Use as instructed to test blood sugars 3 times daily DX.[id number]   insulin aspart (NOVOLOG FLEXPEN) 100 UNIT/ML FlexPen Inject 10 Units into the skin daily with breakfast AND 10 Units daily with lunch AND 12 Units daily with supper. Max daily 60 units per correction scale.   insulin glargine (LANTUS SOLOSTAR) 100 UNIT/ML Solostar Pen Inject 22 Units into the skin daily.   Insulin Pen Needle (BD PEN NEEDLE MICRO U/F) 32G X 6 MM MISC USE 4 TIMES A DAY NOVOLOG(3) AND LANTUS9(1)   meloxicam (MOBIC) 15 MG tablet TAKE 1 TABLET (15 MG TOTAL) BY MOUTH DAILY.   metoprolol  succinate (TOPROL-XL) 25 MG 24 hr tablet TAKE 1 TABLET BY MOUTH DAILY. PLEASE MAKE OVERDUE APPT WITH DR. Marlou Porch BEFORE ANYMORE REFILLS.   Multiple Vitamins-Minerals (PRESERVISION AREDS 2 PO) Take 1 tablet by mouth 2 (two) times daily.    MYRBETRIQ 50 MG TB24 tablet TAKE 1 TABLET BY MOUTH EVERY DAY   omega-3 acid ethyl esters (LOVAZA) 1 g capsule Take 1 capsule (1 g total) by mouth 2 (two) times daily.   omeprazole (PRILOSEC) 20 MG capsule Take 1 capsule (20 mg total) by mouth daily.   pregabalin (LYRICA) 75 MG capsule Take 1 capsule (75 mg total) by mouth 3 (three) times daily.   PREMARIN vaginal cream Place vaginally.   ramipril (ALTACE) 10  MG capsule TAKE 1 CAPSULE (10 MG TOTAL) BY MOUTH EVERY EVENING.   RELION INSULIN SYRINGE 1ML/31G 31G X 5/16" 1 ML MISC Use as directed   rOPINIRole (REQUIP) 1 MG tablet TAKE 1 TABLET BY MOUTH EVERYDAY AT BEDTIME   rosuvastatin (CRESTOR) 10 MG tablet TAKE 1 TABLET BY MOUTH EVERY DAY   verapamil (CALAN) 120 MG tablet Take 120 mg by mouth daily as needed (afib).    verapamil (VERELAN PM) 240 MG 24 hr capsule TAKE 1 CAPSULE BY MOUTH EVERY DAY   VITAMIN D PO Take 10,000 Units by mouth daily. 10,000 in the am and 5,000 in the pm   PFIZER-BIONT COVID-19 VAC-TRIS SUSP injection    SHINGRIX injection    [DISCONTINUED] methylPREDNISolone (MEDROL DOSEPAK) 4 MG TBPK tablet 6 pills today, 5 tomorrow, 4 next day, 3 next day, 2 next day, 1 next day with meal (Patient not taking: Reported on 11/16/2020)   [DISCONTINUED] VASCEPA 1 g capsule Take 2 g by mouth 2 (two) times daily. (Patient not taking: Reported on 11/16/2020)   No facility-administered encounter medications on file as of 11/16/2020.    Allergies (verified) Amoxil [amoxicillin], Lisinopril, Chocolate, Demerol, and Other   History: Past Medical History:  Diagnosis Date   Anemia    during 1st pregnancy   Anxiety    Arthritis    Cancer ([place])    Basal - face   Complication of anesthesia    Depression    Diarrhea, functional    Dysrhythmia    Paroysmal Atrial Tachycardia   Essential hypertension    Fibromyalgia    GERD (gastroesophageal reflux disease)    H/O acute pancreatitis    Headache    History of hiatal hernia    Hyperlipemia    Neuropathy    Osteoporosis    Panic attacks    Paralysis (HCC) 01/06/2019   hands   PAT (paroxysmal atrial tachycardia) (HCC)    Pneumonia    PONV (postoperative nausea and vomiting)    PSVT (paroxysmal supraventricular tachycardia) (HCC)    Reflux    Restless leg syndrome    Spinal cord compression (HCC)    Type 2 diabetes mellitus (HCC)    Type II   Vertigo    not current   Past Surgical  History:  Procedure Laterality Date   ABDOMINAL HYSTERECTOMY     ANKLE SURGERY Left    x 2   ANTERIOR CERVICAL DECOMP/DISCECTOMY FUSION N/A 11/12/2017   Procedure: Cervical seven to Thoracic one  Anterior cervical decompression/discectomy/fusion with removal of old plate;  Surgeon: [name], MD;  Location: [facility];  Service: Neurosurgery;  Laterality: N/A;   BACK SURGERY     T12-L1 fusion, Anterior Cervical fusion   CARDIAC CATHETERIZATION N/A 02/16/2015   Procedure: Left  Heart Cath and Coronary Angiography;  Surgeon: Sherren Mocha, MD;  Location: [facility];  Service: Cardiovascular;  Laterality: N/A;   CHOLECYSTECTOMY     COLONOSCOPY     ESOPHAGOGASTRODUODENOSCOPY  12/21/2010   Procedure: ESOPHAGOGASTRODUODENOSCOPY (EGD);  Surgeon: Landry Dyke, MD;  Location: [facility];  Service: Endoscopy;  Laterality: N/A;   EXCISION MORTON'S NEUROMA Right    EYE SURGERY Bilateral    Cataract   FRACTURE SURGERY     ankle x 2    KNEE SURGERY Right    x 3- arthroscopy   NECK SURGERY     POSTERIOR CERVICAL FUSION/FORAMINOTOMY N/A 01/22/2018   Procedure: Cervical six-seven Posterior Decompression with Posterior Fixation Cervical Five to Thoracic Two;  Surgeon: [name], MD;  Location: [facility];  Service: Neurosurgery;  Laterality: N/A;  Cervical 6-7 Posteror decompression with posterior fixation Cervical 5 to Thoracic 2   ROTATOR CUFF REPAIR Right    TONSILLECTOMY     TOTAL KNEE ARTHROPLASTY Left 04/10/2017   Procedure: LEFT TOTAL KNEE ARTHROPLASTY;  Surgeon: Paralee Cancel, MD;  Location: [facility];  Service: Orthopedics;  Laterality: Left;  70 mins   UPPER GASTROINTESTINAL ENDOSCOPY     WRIST SURGERY Left    fracture   Family History  Problem Relation Age of Onset   Heart failure Mother    Hyperlipidemia Mother    Lung cancer Father        Lymphoma   Hyperlipidemia Maternal Grandmother    Peripheral vascular disease Maternal Grandmother    Social History   Socioeconomic  History   Marital status: Widowed    Spouse name: Glendell Docker   Number of children: 2   Years of education: Not on file   Highest education level: Not on file  Occupational History   Occupation: Retired  Tobacco Use   Smoking status: Former   Smokeless tobacco: Never   Tobacco comments:    smoke 1 cigarette every few weeks when the [place] lady came by.  Vaping Use   Vaping Use: Never used  Substance and Sexual Activity   Alcohol use: No   Drug use: No   Sexual activity: Not on file  Other Topics Concern   Not on file  Social History Narrative   Lives home alone.  Daughter [name] with her today.   Independent of ADLs.  Education HS.  Widowed.     Social Determinants of Health   Financial Resource Strain: Low Risk    Difficulty of Paying Living Expenses: Not hard at all  Food Insecurity: No Food Insecurity   Worried About Charity fundraiser in the Last Year: Never true   [place] in the Last Year: Never true  Transportation Needs: No Transportation Needs   Lack of Transportation (Medical): No   Lack of Transportation (Non-Medical): No  Physical Activity: Insufficiently Active   Days of Exercise per Week: 5 days   Minutes of Exercise per Session: 20 min  Stress: Stress Concern Present   Feeling of Stress : Rather much  Social Connections: Socially Isolated   Frequency of Communication with Friends and Family: More than three times a week   Frequency of Social Gatherings with Friends and Family: Never   Attends Religious Services: Never   Marine scientist or Organizations: No   Attends Archivist Meetings: Never   Marital Status: Widowed    Tobacco Counseling Counseling given: Not Answered Tobacco comments: smoke 1 cigarette every few weeks when the  [place] lady came by.   Clinical Intake:  Pre-visit preparation completed: Yes  Pain : 0-10 Pain Score: 9  Pain Type: Chronic pain Pain Descriptors / Indicators: Other (Comment), Tender, Aching (just  pain) Pain Onset: More than a month ago Pain Frequency: Constant     BMI - recorded: 35.93 Nutritional Risks: None Diabetes: Yes CBG done?: Yes (167) CBG resulted in Enter/ Edit results?: No Did pt. bring in CBG monitor from home?: No  How often do you need to have someone help you when you read instructions, pamphlets, or other written materials from your doctor or pharmacy?: 1 - Never  Diabetic?Nutrition Risk Assessment:  Has the patient had any N/V/D within the last 2 months?  No  Does the patient have any non-healing wounds?  No  Has the patient had any unintentional weight loss or weight gain?  No   Diabetes:  Is the patient diabetic?  Yes  If diabetic, was a CBG obtained today?  Yes  Did the patient bring in their glucometer from home?  No  How often do you monitor your CBG's? Dexacom.   Financial Strains and Diabetes Management:  Are you having any financial strains with the device, your supplies or your medication? No .  Does the patient want to be seen by Chronic Care Management for management of their diabetes?  No  Would the patient like to be referred to a Nutritionist or for Diabetic Management?  No   Diabetic Exams:  Diabetic Eye Exam: Overdue for diabetic eye exam. Pt has been advised about the importance in completing this exam. Patient advised to call and schedule an eye exam. Diabetic Foot Exam: Completed 12/12/19   Interpreter Needed?: No  Information entered by :: Charlott Rakes, LPN   Activities of Daily Living In your present state of health, do you have any difficulty performing the following activities: 11/16/2020  Hearing? Y  Comment slight loss  Vision? N  Difficulty concentrating or making decisions? Y  Comment with concentrating  Walking or climbing stairs? N  Comment no stairs  Dressing or bathing? Y  Comment caregivers  Doing errands, shopping? Y  Comment caregivers and daughters  Conservation officer, nature and eating ? Y  Comment caregivers   Using the Toilet? Y  Comment caregivers  In the past six months, have you accidently leaked urine? Y  Comment incontinent  Do you have problems with loss of bowel control? Y  Managing your Medications? Y  Comment daughters  Managing your Finances? Y  Comment daughters  Housekeeping or managing your Housekeeping? Y  Comment caregivers  Some recent data might be hidden    Patient Care Team: [name], [name], MD as PCP - General (Family Medicine) Jerline Pain, MD as PCP - Cardiology (Cardiology) Viona Gilmore, [facility] as Pharmacist (Pharmacist)  Indicate any recent Medical Services you may have received from other than [facility] providers in the past year (date may be approximate).     Assessment:   This is a routine wellness examination for [place].  Hearing/Vision screen Hearing Screening - Comments:: Pt has slight loss Vision Screening - Comments:: Pt follows up with Dr Macarthur Critchley for annual eye exams   Dietary issues and exercise activities discussed: Current Exercise Habits: Home exercise routine, Type of exercise: walking;stretching, Time (Minutes): 20, Frequency (Times/Week): 5, Weekly Exercise (Minutes/Week): 100   Goals Addressed             This Visit's Progress    Patient Stated  None at this time       Depression Screen PHQ 2/9 Scores 11/16/2020 12/12/2019 11/11/2019 04/08/2019 09/28/2018 08/23/2018 10/19/2017  PHQ - 2 Score 2 6 2 6 1  0 3  PHQ- 9 Score 5 - 6 23 - - 20    Fall Risk Fall Risk  11/16/2020 09/06/2020 06/15/2020 04/26/2020 01/27/2020  Falls in the past year? 1 1 1 1 1   Comment - - - - -  Number falls in past yr: 1 1 1 1 1   Comment - - - - -  Injury with Fall? 0 1 - 0 0  Risk Factor Category  - - - - -  Risk for fall due to : Impaired balance/gait;Impaired mobility;Impaired vision - - - -  Risk for fall due to: Comment - - - - -  Follow up Falls prevention discussed - - - -    FALL RISK PREVENTION PERTAINING TO THE HOME:  Any stairs in or  around the home? No  If so, are there any without handrails? No  Home free of loose throw rugs in walkways, pet beds, electrical cords, etc? Yes  Adequate lighting in your home to reduce risk of falls? Yes   ASSISTIVE DEVICES UTILIZED TO PREVENT FALLS:  Life alert? Yes  Use of a cane, walker or w/c? Yes  Grab bars in the bathroom? Yes  Shower chair or bench in shower? Yes  Elevated toilet seat or a handicapped toilet? Yes   TIMED UP AND GO:  Was the test performed? Yes .  Length of time to ambulate 10 feet: 25 sec.   Gait slow and steady with assistive device  Cognitive Function:Declined at this time MMSE - Mini Mental State Exam 09/05/2017  Not completed: (No Data)     6CIT Screen 11/11/2019  What Year? 0 points  What month? 0 points  Count back from 20 0 points  Months in reverse 0 points  Repeat phrase 0 points    Immunizations Immunization History  Administered Date(s) Administered   Fluad Quad(high Dose 65+) 11/08/2018, 12/12/2019   Influenza, High Dose Seasonal PF 11/10/2016, 12/27/2017   PFIZER Comirnaty(Gray Top)Covid-19 Tri-Sucrose Vaccine 08/05/2020   PFIZER(Purple Top)SARS-COV-2 Vaccination 03/28/2019, 04/22/2019, 12/22/2019   Pneumococcal Conjugate-13 08/13/2012   Pneumococcal Polysaccharide-23 08/23/2007, 12/01/2019   Tdap 11/13/2019   Zoster Recombinat (Shingrix) 12/01/2019, 08/05/2020    TDAP status: Up to date  Flu Vaccine status: Due, Education has been provided regarding the importance of this vaccine. Advised may receive this vaccine at local pharmacy or Health Dept. Aware to provide a copy of the vaccination record if obtained from local pharmacy or Health Dept. Verbalized acceptance and understanding.  Pneumococcal vaccine status: Up to date  Covid-19 vaccine status: Completed vaccines  Qualifies for Shingles Vaccine? Yes   Zostavax completed Yes   Shingrix Completed?: Yes  Screening Tests Health Maintenance  Topic Date Due    OPHTHALMOLOGY EXAM  05/05/2020   HEMOGLOBIN A1C  09/06/2020   INFLUENZA VACCINE  09/13/2020   COVID-19 Vaccine (5 - Booster for Pfizer series) 12/05/2020   FOOT EXAM  12/11/2020   DEXA SCAN  Completed   Hepatitis C Screening  Completed   Zoster Vaccines- Shingrix  Completed   HPV VACCINES  Aged Out   TETANUS/TDAP  Discontinued    Health Maintenance  Health Maintenance Due  Topic Date Due   OPHTHALMOLOGY EXAM  05/05/2020   HEMOGLOBIN A1C  09/06/2020   INFLUENZA VACCINE  09/13/2020    Colorectal cancer screening: No  longer required.   Mammogram status: Completed 02/15/16. Repeat every year  Bone Density status: Completed 06/14/15. Results reflect: Bone density results: NORMAL. Repeat every 2 years.   Additional Screening:  Hepatitis C Screening:  Completed 09/17/19  Vision Screening: Recommended annual ophthalmology exams for early detection of glaucoma and other disorders of the eye. Is the patient up to date with their annual eye exam?  Yes  Who is the provider or what is the name of the office in which the patient attends annual eye exams? Dr Macarthur Critchley If pt is not established with a provider, would they like to be referred to a provider to establish care? No .   Dental Screening: Recommended annual dental exams for proper oral hygiene  Community Resource Referral / Chronic Care Management: CRR required this visit?  Yes   CCM required this visit?  Yes      Plan:     I have personally reviewed and noted the following in the patient's chart:   Medical and social history Use of alcohol, tobacco or illicit drugs  Current medications and supplements including opioid prescriptions.  Functional ability and status Nutritional status Physical activity Advanced directives List of other physicians Hospitalizations, surgeries, and ER visits in previous 12 months Vitals Screenings to include cognitive, depression, and falls Referrals and appointments  In addition, I have  reviewed and discussed with patient certain preventive protocols, quality metrics, and best practice recommendations. A written personalized care plan for preventive services as well as general preventive health recommendations were provided to patient.     Willette Brace, LPN   35/08/175   Nurse Notes: pt stated she has been really depressed and overwhelmed without intent to harm self, related to her daughters telling her they would lace her in a nursing home and was unable to assist her as they have been. Referral placed

## 2020-11-16 NOTE — Patient Instructions (Signed)
[name] , Thank you for taking time to come for your Medicare Wellness Visit. I appreciate your ongoing commitment to your health goals. Please review the following plan we discussed and let me know if I can assist you in the future.   Screening recommendations/referrals: Colonoscopy: No longer required Mammogram: Done 02/15/16 repeat every year Bone Density: Done 06/14/15 repeat every 2 years  Recommended yearly ophthalmology/optometry visit for glaucoma screening and checkup Recommended yearly dental visit for hygiene and checkup  Vaccinations: Influenza vaccine: Due and discussed Pneumococcal vaccine: Completed  Tdap vaccine: Done 11/13/19 repeat every 10 years  Shingles vaccine: Completed 12/01/19 & 08/05/20   Covid-19:Completed 2/12, 3/9, 12/22/19 & 08/05/20  Advanced directives: Please bring a copy of your health care power of attorney and living will to the office at your convenience.  Conditions/risks identified: None at this time  Next appointment: Follow up in one year for your annual wellness visit    Preventive Care 65 Years and Older, Female Preventive care refers to lifestyle choices and visits with your health care provider that can promote health and wellness. What does preventive care include? A yearly physical exam. This is also called an annual well check. Dental exams once or twice a year. Routine eye exams. Ask your health care provider how often you should have your eyes checked. Personal lifestyle choices, including: Daily care of your teeth and gums. Regular physical activity. Eating a healthy diet. Avoiding tobacco and drug use. Limiting alcohol use. Practicing safe sex. Taking low-dose aspirin every day. Taking vitamin and mineral supplements as recommended by your health care provider. What happens during an annual well check? The services and screenings done by your health care provider during your annual well check will depend on your age, overall  health, lifestyle risk factors, and family history of disease. Counseling  Your health care provider may ask you questions about your: Alcohol use. Tobacco use. Drug use. Emotional well-being. Home and relationship well-being. Sexual activity. Eating habits. History of falls. Memory and ability to understand (cognition). Work and work Statistician. Reproductive health. Screening  You may have the following tests or measurements: Height, weight, and BMI. Blood pressure. Lipid and cholesterol levels. These may be checked every 5 years, or more frequently if you are over 36 years old. Skin check. Lung cancer screening. You may have this screening every year starting at age 9 if you have a 30-pack-year history of smoking and currently smoke or have quit within the past 15 years. Fecal occult blood test (FOBT) of the stool. You may have this test every year starting at age 10. Flexible sigmoidoscopy or colonoscopy. You may have a sigmoidoscopy every 5 years or a colonoscopy every 10 years starting at age 49. Hepatitis C blood test. Hepatitis B blood test. Sexually transmitted disease (STD) testing. Diabetes screening. This is done by checking your blood sugar (glucose) after you have not eaten for a while (fasting). You may have this done every 1-3 years. Bone density scan. This is done to screen for osteoporosis. You may have this done starting at age 67. Mammogram. This may be done every 1-2 years. Talk to your health care provider about how often you should have regular mammograms. Talk with your health care provider about your test results, treatment options, and if necessary, the need for more tests. Vaccines  Your health care provider may recommend certain vaccines, such as: Influenza vaccine. This is recommended every year. Tetanus, diphtheria, and acellular pertussis (Tdap, Td) vaccine. You may  need a Td booster every 10 years. Zoster vaccine. You may need this after age  35. Pneumococcal 13-valent conjugate (PCV13) vaccine. One dose is recommended after age 78. Pneumococcal polysaccharide (PPSV23) vaccine. One dose is recommended after age 72. Talk to your health care provider about which screenings and vaccines you need and how often you need them. This information is not intended to replace advice given to you by your health care provider. Make sure you discuss any questions you have with your health care provider. Document Released: 02/26/2015 Document Revised: 10/20/2015 Document Reviewed: 12/01/2014 Elsevier Interactive Patient Education  2017 [place] Prevention in the Home Falls can cause injuries. They can happen to people of all ages. There are many things you can do to make your home safe and to help prevent falls. What can I do on the outside of my home? Regularly fix the edges of walkways and driveways and fix any cracks. Remove anything that might make you trip as you walk through a door, such as a raised step or threshold. Trim any bushes or trees on the path to your home. Use bright outdoor lighting. Clear any walking paths of anything that might make someone trip, such as rocks or tools. Regularly check to see if handrails are loose or broken. Make sure that both sides of any steps have handrails. Any raised decks and porches should have guardrails on the edges. Have any leaves, snow, or ice cleared regularly. Use sand or salt on walking paths during winter. Clean up any spills in your garage right away. This includes oil or grease spills. What can I do in the bathroom? Use night lights. Install grab bars by the toilet and in the tub and shower. Do not use towel bars as grab bars. Use non-skid mats or decals in the tub or shower. If you need to sit down in the shower, use a plastic, non-slip stool. Keep the floor dry. Clean up any water that spills on the floor as soon as it happens. Remove soap buildup in the tub or shower  regularly. Attach bath mats securely with double-sided non-slip rug tape. Do not have throw rugs and other things on the floor that can make you trip. What can I do in the bedroom? Use night lights. Make sure that you have a light by your bed that is easy to reach. Do not use any sheets or blankets that are too big for your bed. They should not hang down onto the floor. Have a firm chair that has side arms. You can use this for support while you get dressed. Do not have throw rugs and other things on the floor that can make you trip. What can I do in the kitchen? Clean up any spills right away. Avoid walking on wet floors. Keep items that you use a lot in easy-to-reach places. If you need to reach something above you, use a strong step stool that has a grab bar. Keep electrical cords out of the way. Do not use floor polish or wax that makes floors slippery. If you must use wax, use non-skid floor wax. Do not have throw rugs and other things on the floor that can make you trip. What can I do with my stairs? Do not leave any items on the stairs. Make sure that there are handrails on both sides of the stairs and use them. Fix handrails that are broken or loose. Make sure that handrails are as long as the stairways.  Check any carpeting to make sure that it is firmly attached to the stairs. Fix any carpet that is loose or worn. Avoid having throw rugs at the top or bottom of the stairs. If you do have throw rugs, attach them to the floor with carpet tape. Make sure that you have a light switch at the top of the stairs and the bottom of the stairs. If you do not have them, ask someone to add them for you. What else can I do to help prevent falls? Wear shoes that: Do not have high heels. Have rubber bottoms. Are comfortable and fit you well. Are closed at the toe. Do not wear sandals. If you use a stepladder: Make sure that it is fully opened. Do not climb a closed stepladder. Make sure that  both sides of the stepladder are locked into place. Ask someone to hold it for you, if possible. Clearly mark and make sure that you can see: Any grab bars or handrails. First and last steps. Where the edge of each step is. Use tools that help you move around (mobility aids) if they are needed. These include: Canes. Walkers. Scooters. Crutches. Turn on the lights when you go into a dark area. Replace any light bulbs as soon as they burn out. Set up your furniture so you have a clear path. Avoid moving your furniture around. If any of your floors are uneven, fix them. If there are any pets around you, be aware of where they are. Review your medicines with your doctor. Some medicines can make you feel dizzy. This can increase your chance of falling. Ask your doctor what other things that you can do to help prevent falls. This information is not intended to replace advice given to you by your health care provider. Make sure you discuss any questions you have with your health care provider. Document Released: 11/26/2008 Document Revised: 07/08/2015 Document Reviewed: 03/06/2014 Elsevier Interactive Patient Education  2017 [employer].

## 2020-11-16 NOTE — Chronic Care Management (AMB) (Signed)
  Chronic Care Management   Note  11/16/2020 Name: [name] MRN: [id number] DOB: [birth year]  [name] is a 80 y.o. year old female who is a primary care patient of [name], Malka So, MD. [name] is currently enrolled in care management services. An additional referral for Social Worker  was placed.   Follow up plan: Telephone appointment with care management team member scheduled for:11/17/20  [facility]  Direct Dial: [phone]

## 2020-11-17 ENCOUNTER — Ambulatory Visit (INDEPENDENT_AMBULATORY_CARE_PROVIDER_SITE_OTHER): Payer: PPO | Admitting: *Deleted

## 2020-11-17 DIAGNOSIS — F339 Major depressive disorder, recurrent, unspecified: Secondary | ICD-10-CM

## 2020-11-17 DIAGNOSIS — M159 Polyosteoarthritis, unspecified: Secondary | ICD-10-CM

## 2020-11-17 DIAGNOSIS — E1142 Type 2 diabetes mellitus with diabetic polyneuropathy: Secondary | ICD-10-CM

## 2020-11-17 DIAGNOSIS — M797 Fibromyalgia: Secondary | ICD-10-CM

## 2020-11-17 DIAGNOSIS — Z794 Long term (current) use of insulin: Secondary | ICD-10-CM

## 2020-11-17 NOTE — Chronic Care Management (AMB) (Signed)
Chronic Care Management    Clinical Social Work Note  11/17/2020 Name: [name] MRN: [id number] DOB: 1940-07-01  [name] is a 80 y.o. year old female who is a primary care patient of [name], Malka So, MD. The CCM team was consulted to assist the patient with chronic disease management and/or care coordination needs related to: [employer] , Level of Care Concerns, and Mental Health Counseling and Resources.   Engaged with patient by telephone for initial visit in response to provider referral for social work chronic care management and care coordination services.   Consent to Services:  The patient was given information about Chronic Care Management services, agreed to services, and gave verbal consent prior to initiation of services.  Please see initial visit note for detailed documentation.   Patient agreed to services and consent obtained.   Assessment: Review of patient past medical history, allergies, medications, and health status, including review of relevant consultants reports was performed today as part of a comprehensive evaluation and provision of chronic care management and care coordination services.     SDOH (Social Determinants of Health) assessments and interventions performed:  SDOH Interventions    Flowsheet Row Most Recent Value  SDOH Interventions   Stress Interventions Provide Counseling, Other (Comment)  Depression Interventions/Treatment  PHQ2-9 Score <4 Follow-up Not Indicated        Advanced Directives Status: See Care Plan for related entries.  CCM Care Plan  Allergies  Allergen Reactions   Amoxil [Amoxicillin] Swelling and Other (See Comments)    Lips Has patient had a PCN reaction causing immediate rash, facial/tongue/throat swelling, SOB or lightheadedness with hypotension:YES Has patient had a PCN reaction causing severe rash involving mucus membranes or skin necrosis:No Has patient had a PCN reaction that required  hospitalization:No Has patient had a PCN reaction occurring within the last 10 years:Yes. If all of the above answers are "NO", then may proceed with Cephalosporin use.    Lisinopril Itching and Swelling    SWELLING REACTION UNSPECIFIED    Chocolate Hives   Demerol Nausea And Vomiting   Other Other (See Comments)    Raw food or nuts - GI pain    Outpatient Encounter Medications as of 11/17/2020  Medication Sig Note   acetaminophen (TYLENOL) 325 MG tablet Take 325 mg by mouth every 6 (six) hours as needed.    aspirin EC 81 MG tablet Take 81 mg by mouth at bedtime.     bisacodyl (BISACODYL LAXATIVE) 10 MG suppository Place 1 suppository (10 mg total) rectally as needed for moderate constipation.    Calcium Carb-Cholecalciferol 600-200 MG-UNIT TABS Take 1 tablet by mouth in the morning and at bedtime.     carbamide peroxide (DEBROX) 6.5 % OTIC solution Place 5 drops into the left ear 2 (two) times daily. 11/11/2019: As needed   Continuous Blood Gluc Receiver (DEXCOM G6 RECEIVER) DEVI 1 Device by Does not apply route daily.    Continuous Blood Gluc Sensor (DEXCOM G6 SENSOR) MISC 1 kit by Does not apply route as directed.    Continuous Blood Gluc Transmit (DEXCOM G6 TRANSMITTER) MISC 1 kit by Does not apply route as directed.    Cyanocobalamin (VITAMIN B 12 PO) Take 1,000 mg by mouth daily.    digoxin (LANOXIN) 0.125 MG tablet Take 1 tablet (0.125 mg total) by mouth daily.    docusate sodium (COLACE) 100 MG capsule Take 100 mg by mouth daily. twice a day    DULoxetine (CYMBALTA) 30 MG  capsule TAKE 2 CAPSULES BY MOUTH EVERY DAY    ezetimibe (ZETIA) 10 MG tablet TAKE 1 TABLET BY MOUTH EVERY DAY    glucose blood test strip Use as instructed to test blood sugars 3 times daily [id number]    insulin aspart (NOVOLOG FLEXPEN) 100 UNIT/ML FlexPen Inject 10 Units into the skin daily with breakfast AND 10 Units daily with lunch AND 12 Units daily with supper. Max daily 60 units per correction scale.     insulin glargine (LANTUS SOLOSTAR) 100 UNIT/ML Solostar Pen Inject 22 Units into the skin daily.    Insulin Pen Needle (BD PEN NEEDLE MICRO U/F) 32G X 6 MM MISC USE 4 TIMES A DAY NOVOLOG(3) AND LANTUS9(1)    meloxicam (MOBIC) 15 MG tablet TAKE 1 TABLET (15 MG TOTAL) BY MOUTH DAILY.    metoprolol succinate (TOPROL-XL) 25 MG 24 hr tablet TAKE 1 TABLET BY MOUTH DAILY. PLEASE MAKE OVERDUE APPT WITH DR. Marlou Porch BEFORE ANYMORE REFILLS.    Multiple Vitamins-Minerals (PRESERVISION AREDS 2 PO) Take 1 tablet by mouth 2 (two) times daily.     MYRBETRIQ 50 MG TB24 tablet TAKE 1 TABLET BY MOUTH EVERY DAY    omega-3 acid ethyl esters (LOVAZA) 1 g capsule Take 1 capsule (1 g total) by mouth 2 (two) times daily.    omeprazole (PRILOSEC) 20 MG capsule Take 1 capsule (20 mg total) by mouth daily.    PFIZER-BIONT COVID-19 VAC-TRIS SUSP injection     pregabalin (LYRICA) 75 MG capsule Take 1 capsule (75 mg total) by mouth 3 (three) times daily.    PREMARIN vaginal cream Place vaginally.    ramipril (ALTACE) 10 MG capsule TAKE 1 CAPSULE (10 MG TOTAL) BY MOUTH EVERY EVENING.    RELION INSULIN SYRINGE 1ML/31G 31G X 5/16" 1 ML MISC Use as directed    rOPINIRole (REQUIP) 1 MG tablet TAKE 1 TABLET BY MOUTH EVERYDAY AT BEDTIME    rosuvastatin (CRESTOR) 10 MG tablet TAKE 1 TABLET BY MOUTH EVERY DAY    SHINGRIX injection     verapamil (CALAN) 120 MG tablet Take 120 mg by mouth daily as needed (afib).  11/11/2019: As needed   verapamil (VERELAN PM) 240 MG 24 hr capsule TAKE 1 CAPSULE BY MOUTH EVERY DAY    VITAMIN D PO Take 10,000 Units by mouth daily. 10,000 in the am and 5,000 in the pm    No facility-administered encounter medications on file as of 11/17/2020.    Patient Active Problem List   Diagnosis Date Noted   Aortic atherosclerosis ([place]) 09/20/2019   Hyperlipidemia associated with type 2 diabetes mellitus ([place]) 09/17/2019   Mild recurrent major depression ([place]) 09/17/2019   Type 2 diabetes mellitus with diabetic  polyneuropathy, with long-term current use of insulin ([place]) 09/04/2019   Fatigue 09/03/2019   Dysuria 09/03/2019   Peripheral neuropathy 02/28/2019   Thoracic myelopathy 01/08/2019   Cervical spine fracture ([place]) 01/22/2018   Weakness 01/21/2018   Closed C7 fracture ([place]) 01/21/2018   Fall    Lower urinary tract infectious disease    Cervical spondylosis with myelopathy 11/12/2017   Tinnitus of both ears 10/19/2017   Rhinorrhea 09/26/2017   Chronic diarrhea 08/08/2017   S/P left TKA 04/10/2017   Unstable gait 01/12/2017   Generalized osteoarthritis of hand 01/12/2017   RLS (restless legs syndrome) 11/09/2016   GERD (gastroesophageal reflux disease) 07/13/2016   Morbid obesity ([place]) 06/01/2016   Vitamin D deficiency 06/01/2016   Vitamin B12 deficiency 03/27/2016   Chronic cough 03/16/2016  Left foot pain 03/16/2016   Demand ischemia ([place]) 02/14/2015   Degenerative drusen 07/13/2013   Type 2 diabetes mellitus without ophthalmic manifestations ([place]) 07/13/2013   DM (diabetes mellitus) type II controlled, neurological manifestation ([place]) 05/07/2012   SVT (supraventricular tachycardia) ([place]) 05/07/2012   Essential hypertension, benign 05/07/2012   Fibromyalgia 05/07/2012    Conditions to be addressed/monitored: Anxiety, Depression, and chronic pain ; Level of care concerns, ADL IADL limitations, Mental Health Concerns , and Lacks knowledge of community resource:    Care Plan : LCSW Plan of Care  Updates made by Deirdre Peer, LCSW since 11/17/2020 12:00 AM     Problem: Social and Functional Symptoms impacting patient's overall mental healtha and QOL   Priority: High     Long-Range Goal: Provide pt with resources, support and guidance to enhance her mental health, wellness and QOL   Start Date: 11/17/2020  Expected End Date: 01/12/2021  This Visit's Progress: On track  Priority: High  Note:   Current barriers:   Patient in need of assistance with connecting to community  resources for Financial constraints related to needing help with paying of bills/managing bills , Limited social support, Level of care concerns, Mental Health Concerns , Family and relationship dysfunction, Inability to perform ADL's independently, and Lacks knowledge of community resources Acknowledges deficits with meeting this unmet need Patient is unable to independently navigate community resource options without care coordination support Clinical Goals:  demonstrate a reduction in symptoms related to :Anxiety and Depression  patient will work with SW to address concerns related to level of care needs, legal and personal support needs and mental health wellness patient will follow up with personal Attorney* as directed by SW Clinical Interventions:  CSW spoke with pt who shared that her local daughter [name]) alerted her over the weekend to her plans to "move out of state in 6 months and will no longer help her". Per pt, daughter has been managing her bills being paid online. Pt also reports having private duty caregivers from [place] and [place]pm-7[place].  She has a life alert and does stay alone at night. Her caregivers help her with transportation to appointments, errands, etc.  May benefit from [place] PT/OT/RN assessments- PCP to arrange if agrees Discussed options for increasing her in-home caregiver support hours as needed as well as facility placement options. Pt lives in [place] and worked at [employer] in [place]. 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 Pt has property/assets and would be private pay in a facility- level of care needed would be determined prior to move. Pt may consider this; however, initially wants to pursue legal support to get things set as she prefers. CSW mentioned to pt there are agencies that offer Payee support and CSW will ask Care Guide to follow up with pt with info/resources for this if she decides to pursue further.  Pt  also shared that her daughter is HCPOA and voices; "unfortunately".  CSW alerted pt to the importance of she making nay changes to her legal decisions soon and plans to do so through her existing personal Attorney.  Collaboration with [name], [name], MD regarding development and update of comprehensive plan of care as evidenced by provider attestation and co-signature Inter-disciplinary care team collaboration (see longitudinal plan of care) Assessment of needs, barriers , agencies contacted, as well as how impacting  Review various resources, discussed options and provided patient information about Financial constraints related to  daughter stating she is moving out of state in 6 months and will no longer oversee paying of pt's bills, Level of care concerns, ADL IADL limitations, Mental Health Concerns , Family and relationship dysfunction, and Inability to perform ADL's independently Collaborated with appropriate clinical care team members regarding patient needs Limited social support, Inability to perform ADL's independently, and Lacks knowledge of community resource:   , Anxiety, Depression, and chronic pain Referral placed via [place] [facility] Patient interviewed and appropriate assessments performed Provided mental health counseling with regard to anxiety/depression  Provided patient with information about seeking placement, levels of care, payment process, legal rights re: HCPOA, Durable POA, etc and private duty care  Discussed plans with patient for ongoing care management follow up and provided patient with direct contact information for care management team Advised patient to contact her personal Attorney to request a meeting asap to discuss and change her POA documents and other legal decisions made in past to support her current wishes. Collaborated with primary care provider re: LCSW assessment and Care Plan Provided education and assistance to client regarding Advanced Directives. Provided  education to patient/caregiver regarding level of care options. Other interventions provided: Depression screen reviewed  Solution-Focused Strategies  Active listening / Reflection utilized  Emotional Support Provided Problem [place] strategies reviewed Provided brief CBT  Consideration of in-home help encouraged  [place] of Attorney  Patient Goals:  -contact your personal Attorney to plan to meet asap regarding your wishes - begin a notebook of services in my neighborhood or community - call 211 when I need some help - follow-up on any referrals for help I am given - think ahead to make sure my need does not become an emergency - make a note about what I need to have by the phone or take with me, like an identification card or social security number have a back-up plan - have a back-up plan - make a list of family or friends that I can call  -  Follow Up Plan: Appointment scheduled for SW follow up with client by phone on:  1-2 days      Follow Up Plan: SW will follow up with patient by phone over the next 1-2 days      Eduard Clos MSW, [place] Licensed Clinical Social Worker [place]

## 2020-11-17 NOTE — Patient Instructions (Signed)
Visit Information   PATIENT GOALS:   Goals Addressed               This Visit's Progress     Find Help in My Community (pt-stated)        Timeframe:  Long-Range Goal Priority:  High Start Date:     11/17/20                        Expected End Date:              01/12/21         Follow Up Date 11/19/20   -contact your personal Attorney to plan to meet asap regarding your wishes - begin a notebook of services in my neighborhood or community - call 211 when I need some help - follow-up on any referrals for help I am given - think ahead to make sure my need does not become an emergency - make a note about what I need to have by the phone or take with me, like an identification card or social security number have a back-up plan - have a back-up plan - make a list of family or friends that I can call    Why is this important?   Knowing how and where to find help for yourself or family in your neighborhood and community is an important skill.  You will want to take some steps to learn how.    Notes:         Consent to CCM Services: Ms. Ober was given information about Chronic Care Management services including:  CCM service includes personalized support from designated clinical staff supervised by her physician, including individualized plan of care and coordination with other care providers 24/7 contact phone numbers for assistance for urgent and routine care needs. Service will only be billed when office clinical staff spend 20 minutes or more in a month to coordinate care. Only one practitioner may furnish and bill the service in a calendar month. The patient may stop CCM services at any time (effective at the end of the month) by phone call to the office staff. The patient will be responsible for cost sharing (co-pay) of up to 20% of the service fee (after annual deductible is met).  Patient agreed to services and verbal consent obtained.   The patient verbalized  understanding of instructions, educational materials, and care plan provided today and declined offer to receive copy of patient instructions, educational materials, and care plan.   The care management team will reach out to the patient again over the next 1-2 days.   Eduard Clos MSW, LCSW Licensed Clinical Social Worker  [phone]   CLINICAL CARE PLAN: Patient Care Plan: CCM Pharmacy Care Plan     Problem Identified: Problem: Hypertension, Hyperlipidemia, Diabetes, Atrial Fibrillation, GERD and bone health      Long-Range Goal: Patient-Specific Goal   Start Date: 04/08/2020  Expected End Date: 04/08/2021  This Visit's Progress: On track  Priority: High  Note:   Current Barriers:  Unable to independently monitor therapeutic efficacy Unable to achieve control of cholesterol   Pharmacist Clinical Goal(s):  Over the next 120 days, patient will achieve adherence to monitoring guidelines and medication adherence to achieve therapeutic efficacy achieve control of cholesterol as evidenced by next lipid panel  through collaboration with PharmD and provider.   Interventions: 1:1 collaboration with [name], [name], MD regarding development and update of comprehensive plan of care as  evidenced by provider attestation and co-signature Inter-disciplinary care team collaboration (see longitudinal plan of care) Comprehensive medication review performed; medication list updated in electronic medical record  Hypertension (BP goal <140/90) -Controlled -Current treatment: Metoprolol succinate (Toprol- XL) 15m, 1 tablet once daily - in AM  Ramipril 114m 1 capsule every evening  Verapamil 24065m1 capsule once daily - in AM -Medications previously tried: none  -Current home readings: 120/70, 126/80  -Current dietary habits: did not discuss -Current exercise habits: PT exercises -Denies hypotensive/hypertensive symptoms -Educated on Importance of home blood pressure  monitoring; -Counseled to monitor BP at home weekly, document, and provide log at future appointments -Counseled on diet and exercise extensively Recommended to continue current medication  Hyperlipidemia: (LDL goal < 70) -Uncontrolled -Current treatment: Ezetimibe 61m49m tablet once daily  Rosuvastatin 10 mg 1 tablet daily Lovaza 1 g twice daily -Medications previously tried: none -Current dietary patterns: patient limits fatty foods and is eating more greens and salads -Current exercise habits: PT exercises -Educated on Cholesterol goals;  Importance of limiting foods high in cholesterol; -Recommended switching Lovaza to Vascepa for better triglyceride lowering and ASCVD benefit  Diabetes (A1c goal <7%) -Not ideally controlled -Current medications: Lantus inject 18 units daily Novolog inject 8  units with breakfast and lunch and 10 units with supper -Medications previously tried: n/a  -Current home glucose readings: uses Dexcom CGM -Denies hypoglycemic/hyperglycemic symptoms -Current meal patterns:  breakfast: eggs and bacon lunch: salad dinner: n.a snacks: n.a drinks: n/a -Current exercise: PT exercises -Educated on Exercise goal of 150 minutes per week; Carbohydrate counting and/or plate method -Counseled to check feet daily and get yearly eye exams -Counseled on diet and exercise extensively Recommended to continue current medication  Paroxysmal atrial tachycardia (Goal: regulate heart rate) -Controlled -Current treatment: Digoxin 0.125mg27mtablet one daily  Metoprolol succinate (Toprol- XL) 25mg,60mablet once daily  Verapamil 120mg, 85mblet as needed   Verapamil 240mg, 19msule once daily  -Medications previously tried: n/a -Home BP and HR readings: did not provide  -Counseled on  monitoring HR along with BP -Recommended to continue current medication   Neuropathy (Goal: minimize symptoms) -Controlled -Current treatment  Duloxetine 30 mg 1 capsule  daily Pregabalin 50 mg 1 capsule twice daily -Medications previously tried: none -Recommended to continue current medication  Urinary urgency (Goal: minimize symptoms) -Controlled -Current treatment  Mirabegron 50 mg 1 tablet daily -Medications previously tried: none  -Recommended to continue current medication Patient has noticed a big difference with the higher dose  GERD (Goal: minimize symptoms of GERD/acid reflux) -Controlled -Current treatment  Omeprazole 20 mg 1 capsule daily -Medications previously tried: none -Counseled on non-pharmacologic  management of symptoms such as elevating the head of your bed, avoiding eating 2-3 hours before bed, avoiding triggering foods such as acidic, spicy, or fatty foods, eating smaller meals, and wearing clothes that are loose around the waist    Osteoarthritis/pain (Goal: minimize symptoms of pain) -Controlled -Current treatment  Tylenol 500 mg 2 tablets before breakfast Meloxicam 15 mg 1 tablet daily -Medications previously tried: Norco, Percocet  -Recommended to continue current medication  Health Maintenance -Vaccine gaps: 2nd shingles dose -Current therapy:  Bisacodyl 10 mg as needed Vitamin B12 1000 mcg 1 tablet daily Docusate 100 mg 1 capsule twice daily Preservision Areds 2 1 tablet twice daily Aspirin 81 mg 1 tablet daily -Educated on Cost vs benefit of each product must be carefully weighed by individual consumer -Patient is satisfied with current therapy and  denies issues -Recommended to continue current medication  Patient Goals/Self-Care Activities Over the next 120 days, patient will:  - take medications as prescribed check glucose with CGM, document, and provide at future appointments check blood pressure weekly, document, and provide at future appointments  Follow Up Plan: Telephone follow up appointment with care management team member scheduled for:     Patient Care Plan: LCSW Plan of Care     Problem  Identified: Social and Functional Symptoms impacting patient's overall mental healtha and QOL   Priority: High     Long-Range Goal: Provide pt with resources, support and guidance to enhance her mental health, wellness and QOL   Start Date: 11/17/2020  Expected End Date: 01/12/2021  This Visit's Progress: On track  Priority: High  Note:   Current barriers:   Patient in need of assistance with connecting to community resources for Financial constraints related to needing help with paying of bills/managing bills , Limited social support, Level of care concerns, Mental Health Concerns , Family and relationship dysfunction, Inability to perform ADL's independently, and Lacks knowledge of community resources Acknowledges deficits with meeting this unmet need Patient is unable to independently navigate community resource options without care coordination support Clinical Goals:  demonstrate a reduction in symptoms related to :Anxiety and Depression  patient will work with SW to address concerns related to level of care needs, legal and personal support needs and mental health wellness patient will follow up with personal Attorney* as directed by SW Clinical Interventions:  CSW spoke with pt who shared that her local daughter [name]) alerted her over the weekend to her plans to "move out of state in 6 months and will no longer help her". Per pt, daughter has been managing her bills being paid online. Pt also reports having private duty caregivers from [place] and 5pm-7pm.  She has a life alert and does stay alone at night. Her caregivers help her with transportation to appointments, errands, etc.  May benefit from [facility] PT/OT/RN assessments- PCP to arrange if agrees Discussed options for increasing her in-home caregiver support hours as needed as well as facility placement options. Pt lives in [place] and worked at [employer] in [place]. 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 Pt has property/assets and would be private pay in a facility- level of care needed would be determined prior to move. Pt may consider this; however, initially wants to pursue legal support to get things set as she prefers. CSW mentioned to pt there are agencies that offer Payee support and CSW will ask Care Guide to follow up with pt with info/resources for this if she decides to pursue further.  Pt also shared that her daughter is HCPOA and voices; "unfortunately".  CSW alerted pt to the importance of she making nay changes to her legal decisions soon and plans to do so through her existing personal Attorney.  Collaboration with [name], [name], MD regarding development and update of comprehensive plan of care as evidenced by provider attestation and co-signature Inter-disciplinary care team collaboration (see longitudinal plan of care) Assessment of needs, barriers , agencies contacted, as well as how impacting  Review various resources, discussed options and provided patient information about Financial constraints related to daughter stating she is moving out of state in 6 months and will no longer oversee paying of pt's bills, Level of care concerns, ADL IADL limitations, Mental Health Concerns , Family and relationship dysfunction, and  Inability to perform ADL's independently Collaborated with appropriate clinical care team members regarding patient needs Limited social support, Inability to perform ADL's independently, and Lacks knowledge of community resource:   , Anxiety, Depression, and chronic pain Referral placed via [facility] Patient interviewed and appropriate assessments performed Provided mental health counseling with regard to anxiety/depression  Provided patient with information about seeking placement, levels of care, payment process, legal rights re: HCPOA, Durable POA, etc and private duty care  Discussed plans with patient for ongoing care management  follow up and provided patient with direct contact information for care management team Advised patient to contact her personal Attorney to request a meeting asap to discuss and change her POA documents and other legal decisions made in past to support her current wishes. Collaborated with primary care provider re: LCSW assessment and Care Plan Provided education and assistance to client regarding Advanced Directives. Provided education to patient/caregiver regarding level of care options. Other interventions provided: Depression screen reviewed  Solution-Focused Strategies  Active listening / Reflection utilized  Emotional Support Provided Problem [place] strategies reviewed Provided brief CBT  Consideration of in-home help encouraged  Cave of Attorney  Patient Goals:  -contact your personal Attorney to plan to meet asap regarding your wishes - begin a notebook of services in my neighborhood or community - call 211 when I need some help - follow-up on any referrals for help I am given - think ahead to make sure my need does not become an emergency - make a note about what I need to have by the phone or take with me, like an identification card or social security number have a back-up plan - have a back-up plan - make a list of family or friends that I can call  -  Follow Up Plan: Appointment scheduled for SW follow up with client by phone on:  1-2 days

## 2020-11-18 ENCOUNTER — Telehealth: Payer: Self-pay | Admitting: *Deleted

## 2020-11-18 ENCOUNTER — Telehealth: Payer: Self-pay | Admitting: Family Medicine

## 2020-11-18 ENCOUNTER — Ambulatory Visit: Payer: PPO | Admitting: *Deleted

## 2020-11-18 DIAGNOSIS — Z794 Long term (current) use of insulin: Secondary | ICD-10-CM

## 2020-11-18 DIAGNOSIS — M159 Polyosteoarthritis, unspecified: Secondary | ICD-10-CM

## 2020-11-18 DIAGNOSIS — F339 Major depressive disorder, recurrent, unspecified: Secondary | ICD-10-CM

## 2020-11-18 DIAGNOSIS — E1142 Type 2 diabetes mellitus with diabetic polyneuropathy: Secondary | ICD-10-CM

## 2020-11-18 DIAGNOSIS — M797 Fibromyalgia: Secondary | ICD-10-CM

## 2020-11-18 NOTE — Chronic Care Management (AMB) (Signed)
Chronic Care Management    Clinical Social Work Note  11/18/2020 Name: [name] MRN: [id number] DOB: 18-Mar-1940  [name] is a 80 y.o. year old female who is a primary care patient of [name], Leah So, MD. The CCM team was consulted to assist the patient with chronic disease management and/or care coordination needs related to: [employer] , Level of Care Concerns, and Holiday representative.   Engaged with patient by telephone for follow up visit in response to provider referral for social work chronic care management and care coordination services.   Consent to Services:  The patient was given information about Chronic Care Management services, agreed to services, and gave verbal consent prior to initiation of services.  Please see initial visit note for detailed documentation.   Patient agreed to services and consent obtained.   Assessment: Review of patient past medical history, allergies, medications, and health status, including review of relevant consultants reports was performed today as part of a comprehensive evaluation and provision of chronic care management and care coordination services.     SDOH (Social Determinants of Health) assessments and interventions performed:    Advanced Directives Status: See Care Plan for related entries.  CCM Care Plan  Allergies  Allergen Reactions   Amoxil [Amoxicillin] Swelling and Other (See Comments)    Lips Has patient had a PCN reaction causing immediate rash, facial/tongue/throat swelling, SOB or lightheadedness with hypotension:YES Has patient had a PCN reaction causing severe rash involving mucus membranes or skin necrosis:No Has patient had a PCN reaction that required hospitalization:No Has patient had a PCN reaction occurring within the last 10 years:Yes. If all of the above answers are "NO", then may proceed with Cephalosporin use.    Lisinopril Itching and Swelling    SWELLING REACTION UNSPECIFIED     Chocolate Hives   Demerol Nausea And Vomiting   Other Other (See Comments)    Raw food or nuts - GI pain    Outpatient Encounter Medications as of 11/18/2020  Medication Sig Note   acetaminophen (TYLENOL) 325 MG tablet Take 325 mg by mouth every 6 (six) hours as needed.    aspirin EC 81 MG tablet Take 81 mg by mouth at bedtime.     bisacodyl (BISACODYL LAXATIVE) 10 MG suppository Place 1 suppository (10 mg total) rectally as needed for moderate constipation.    Calcium Carb-Cholecalciferol 600-200 MG-UNIT TABS Take 1 tablet by mouth in the morning and at bedtime.     carbamide peroxide (DEBROX) 6.5 % OTIC solution Place 5 drops into the left ear 2 (two) times daily. 11/11/2019: As needed   Continuous Blood Gluc Receiver (DEXCOM G6 RECEIVER) DEVI 1 Device by Does not apply route daily.    Continuous Blood Gluc Sensor (DEXCOM G6 SENSOR) MISC 1 kit by Does not apply route as directed.    Continuous Blood Gluc Transmit (DEXCOM G6 TRANSMITTER) MISC 1 kit by Does not apply route as directed.    Cyanocobalamin (VITAMIN B 12 PO) Take 1,000 mg by mouth daily.    digoxin (LANOXIN) 0.125 MG tablet Take 1 tablet (0.125 mg total) by mouth daily.    docusate sodium (COLACE) 100 MG capsule Take 100 mg by mouth daily. twice a day    DULoxetine (CYMBALTA) 30 MG capsule TAKE 2 CAPSULES BY MOUTH EVERY DAY    ezetimibe (ZETIA) 10 MG tablet TAKE 1 TABLET BY MOUTH EVERY DAY    glucose blood test strip Use as instructed to test blood sugars  3 times daily [id number]    insulin aspart (NOVOLOG FLEXPEN) 100 UNIT/ML FlexPen Inject 10 Units into the skin daily with breakfast AND 10 Units daily with lunch AND 12 Units daily with supper. Max daily 60 units per correction scale.    insulin glargine (LANTUS SOLOSTAR) 100 UNIT/ML Solostar Pen Inject 22 Units into the skin daily.    Insulin Pen Needle (BD PEN NEEDLE MICRO U/F) 32G X 6 MM MISC USE 4 TIMES A DAY NOVOLOG(3) AND LANTUS9(1)    meloxicam (MOBIC) 15 MG tablet TAKE  1 TABLET (15 MG TOTAL) BY MOUTH DAILY.    metoprolol succinate (TOPROL-XL) 25 MG 24 hr tablet TAKE 1 TABLET BY MOUTH DAILY. PLEASE MAKE OVERDUE APPT WITH DR. Marlou Porch BEFORE ANYMORE REFILLS.    Multiple Vitamins-Minerals (PRESERVISION AREDS 2 PO) Take 1 tablet by mouth 2 (two) times daily.     MYRBETRIQ 50 MG TB24 tablet TAKE 1 TABLET BY MOUTH EVERY DAY    omega-3 acid ethyl esters (LOVAZA) 1 g capsule Take 1 capsule (1 g total) by mouth 2 (two) times daily.    omeprazole (PRILOSEC) 20 MG capsule Take 1 capsule (20 mg total) by mouth daily.    PFIZER-BIONT COVID-19 VAC-TRIS SUSP injection     pregabalin (LYRICA) 75 MG capsule Take 1 capsule (75 mg total) by mouth 3 (three) times daily.    PREMARIN vaginal cream Place vaginally.    ramipril (ALTACE) 10 MG capsule TAKE 1 CAPSULE (10 MG TOTAL) BY MOUTH EVERY EVENING.    RELION INSULIN SYRINGE 1ML/31G 31G X 5/16" 1 ML MISC Use as directed    rOPINIRole (REQUIP) 1 MG tablet TAKE 1 TABLET BY MOUTH EVERYDAY AT BEDTIME    rosuvastatin (CRESTOR) 10 MG tablet TAKE 1 TABLET BY MOUTH EVERY DAY    SHINGRIX injection     verapamil (CALAN) 120 MG tablet Take 120 mg by mouth daily as needed (afib).  11/11/2019: As needed   verapamil (VERELAN PM) 240 MG 24 hr capsule TAKE 1 CAPSULE BY MOUTH EVERY DAY    VITAMIN D PO Take 10,000 Units by mouth daily. 10,000 in the am and 5,000 in the pm    No facility-administered encounter medications on file as of 11/18/2020.    Patient Active Problem List   Diagnosis Date Noted   Aortic atherosclerosis ([place]) 09/20/2019   Hyperlipidemia associated with type 2 diabetes mellitus ([place]) 09/17/2019   Mild recurrent major depression ([place]) 09/17/2019   Type 2 diabetes mellitus with diabetic polyneuropathy, with long-term current use of insulin ([place]) 09/04/2019   Fatigue 09/03/2019   Dysuria 09/03/2019   Peripheral neuropathy 02/28/2019   Thoracic myelopathy 01/08/2019   Cervical spine fracture ([place]) 01/22/2018   Weakness  01/21/2018   Closed C7 fracture ([place]) 01/21/2018   Fall    Lower urinary tract infectious disease    Cervical spondylosis with myelopathy 11/12/2017   Tinnitus of both ears 10/19/2017   Rhinorrhea 09/26/2017   Chronic diarrhea 08/08/2017   S/P left TKA 04/10/2017   Unstable gait 01/12/2017   Generalized osteoarthritis of hand 01/12/2017   RLS (restless legs syndrome) 11/09/2016   GERD (gastroesophageal reflux disease) 07/13/2016   Morbid obesity ([place]) 06/01/2016   Vitamin D deficiency 06/01/2016   Vitamin B12 deficiency 03/27/2016   Chronic cough 03/16/2016   Left foot pain 03/16/2016   Demand ischemia ([place]) 02/14/2015   Degenerative drusen 07/13/2013   Type 2 diabetes mellitus without ophthalmic manifestations ([place]) 07/13/2013   DM (diabetes mellitus) type II controlled, neurological  manifestation ([place]) 05/07/2012   SVT (supraventricular tachycardia) ([place]) 05/07/2012   Essential hypertension, benign 05/07/2012   Fibromyalgia 05/07/2012    Conditions to be addressed/monitored: Anxiety and Depression; Limited social support, Level of care concerns, Mental Health Concerns , Family and relationship dysfunction, Inability to perform ADL's independently, and Lacks knowledge of community resource:    Care Plan : LCSW Plan of Care  Updates made by Deirdre Peer, LCSW since 11/18/2020 12:00 AM     Problem: Social and Functional Symptoms impacting patient's overall mental healtha and QOL   Priority: High     Long-Range Goal: Provide pt with resources, support and guidance to enhance her mental health, wellness and QOL   Start Date: 11/17/2020  Expected End Date: 01/12/2021  This Visit's Progress: On track  Recent Progress: On track  Priority: High  Note:   Current barriers:   Patient in need of assistance with connecting to community resources for Financial constraints related to needing help with paying of bills/managing bills , Limited social support, Level of care concerns,  Mental Health Concerns , Family and relationship dysfunction, Inability to perform ADL's independently, and Lacks knowledge of community resources Acknowledges deficits with meeting this unmet need Patient is unable to independently navigate community resource options without care coordination support Clinical Goals:  demonstrate a reduction in symptoms related to :Anxiety and Depression  patient will work with SW to address concerns related to level of care needs, legal and personal support needs and mental health wellness patient will follow up with personal Attorney* as directed by SW Clinical Interventions:  11/18/20- Pt has contacted her personal Attorney and states she has an appointment scheduled. Pt continues to decline seeking placement at this time; " I want to see my lawyer first".  Pt feels her in-home caregivers are sufficient at this time.   Pt denies SI/HI and also declines counseling at this time; "I'm just lonely". CSW encouraged pt to consider getting involved and more active in the community- her church ramp/steps are not safe and thus has not been able to attend. CSW suggested she might consider volunteering or going to some [employer] activities; said her caregiver might would also go with her (and drive).    11/17/20 CSW spoke with pt who shared that her local daughter [name]) alerted her over the weekend to her plans to "move out of state in 6 months and will no longer help her". Per pt, daughter has been managing her bills being paid online. Pt also reports having private duty caregivers from [place] and 5pm-7pm.  She has a life alert and does stay alone at night. Her caregivers help her with transportation to appointments, errands, etc.  May benefit from [facility] PT/OT/RN assessments- PCP to arrange if agrees Discussed options for increasing her in-home caregiver support hours as needed as well as facility placement options. Pt lives in [place] and worked at [employer] in [place]. 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 Pt has property/assets and would be private pay in a facility- level of care needed would be determined prior to move. Pt may consider this; however, initially wants to pursue legal support to get things set as she prefers. CSW mentioned to pt there are agencies that offer Payee support and CSW will ask Care Guide to follow up with pt with info/resources for this if she decides to pursue further.  Pt also shared that her daughter is HCPOA and  voices; "unfortunately".  CSW alerted pt to the importance of she making nay changes to her legal decisions soon and plans to do [name] through her existing personal Attorney.  Collaboration with [name], [name], MD regarding development and update of comprehensive plan of care as evidenced by provider attestation and co-signature Inter-disciplinary care team collaboration (see longitudinal plan of care) Assessment of needs, barriers , agencies contacted, as well as how impacting  Review various resources, discussed options and provided patient information about Financial constraints related to daughter stating she is moving out of state in 6 months and will no longer oversee paying of pt's bills, Level of care concerns, ADL IADL limitations, Mental Health Concerns , Family and relationship dysfunction, and Inability to perform ADL's independently Collaborated with appropriate clinical care team members regarding patient needs Limited social support, Inability to perform ADL's independently, and Lacks knowledge of community resource:   , Anxiety, Depression, and chronic pain Referral placed via [facility] Patient interviewed and appropriate assessments performed Provided mental health counseling with regard to anxiety/depression  Provided patient with information about seeking placement, levels of care, payment process, legal rights re: HCPOA, Durable POA, etc and private duty care   Discussed plans with patient for ongoing care management follow up and provided patient with direct contact information for care management team Advised patient to contact her personal Attorney to request a meeting asap to discuss and change her POA documents and other legal decisions made in past to support her current wishes. Collaborated with primary care provider re: LCSW assessment and Care Plan Provided education and assistance to client regarding Advanced Directives. Provided education to patient/caregiver regarding level of care options. Other interventions provided: Depression screen reviewed  Solution-Focused Strategies  Active listening / Reflection utilized  Emotional Support Provided Problem [place] strategies reviewed Provided brief CBT  Consideration of in-home help encouraged  [place] of Attorney  Patient Goals:  -meet with your personal Attorney to plan to meet asap regarding your wishes -consider additional help in home as needed -versus- moving to a facility (Assisted Living level of care may be appropriate) - begin a notebook of services in my neighborhood or community - call 211 when I need some help - follow-up on any referrals for help I am given - think ahead to make sure my need does not become an emergency - make a note about what I need to have by the phone or take with me, like an identification card or social security number have a back-up plan - have a back-up plan - make a list of family or friends that I can call  -  Follow Up Plan: Appointment scheduled for SW follow up with client by phone on:   to be determined by Nat Christen, LCSW, upon her return next week.      Follow Up Plan: SW will follow up with patient by phone over the next 30 days      Eduard Clos MSW, LCSW Licensed Clinical Social Worker [phone]

## 2020-11-18 NOTE — Patient Instructions (Signed)
Visit Information  PATIENT GOALS:  Goals Addressed               This Visit's Progress     Find Help in My Community (pt-stated)        Timeframe:  Long-Range Goal Priority:  High Start Date:     11/17/20                        Expected End Date:              01/12/21         Follow Up Date- to be scheduled by assigned LCSW upon her return next week.  --meet with your personal Attorney to plan to meet asap regarding your wishes -consider additional help in home as needed -versus- moving to a facility (Assisted Living level of care may be appropriate)  - begin a notebook of services in my neighborhood or community - call 211 when I need some help - follow-up on any referrals for help I am given - think ahead to make sure my need does not become an emergency - make a note about what I need to have by the phone or take with me, like an identification card or social security number have a back-up plan - have a back-up plan - make a list of family or friends that I can call   Why is this important?   Knowing how and where to find help for yourself or family in your neighborhood and community is an important skill.  You will want to take some steps to learn how.    Notes:         The patient verbalized understanding of instructions, educational materials, and care plan provided today and declined offer to receive copy of patient instructions, educational materials, and care plan.   The patient has been provided with contact information for the care management team and has been advised to call with any health related questions or concerns.  The care management team will reach out to the patient again over the next 30 days.   Eduard Clos MSW, LCSW Licensed Clinical Social Worker [phone]

## 2020-11-18 NOTE — Telephone Encounter (Signed)
   Telephone encounter was:  Successful.  11/18/2020 Name: [name] MRN: [id number] DOB: [birth year]  SILVANNA OHMER is a 80 y.o. year old female who is a primary care patient of [name], Malka So, MD . The community resource team was consulted for assistance with   Mailing information on guardianship and representiitve payees , patient has a n appt with a lawyer to change her POA;  patient asked for information to be mailed    Care guide performed the following interventions: Patient provided with information about care guide support team and interviewed to confirm resource needs Follow up call placed to community resources to determine status of patients referral.  Follow Up Plan:  No further follow up planned at this time. The patient has been provided with needed resources. [address], Care Management  [phone] [address] , [place] [address] Email : [email or web address]

## 2020-11-18 NOTE — Telephone Encounter (Signed)
Patient called stating that she is needing a "low dose nerve pill" to help calm her down.  Informed patient that Dr. [name] and her team were out of the office today but a message could be sent back for them for when they return.  Please advise.

## 2020-11-19 ENCOUNTER — Telehealth (INDEPENDENT_AMBULATORY_CARE_PROVIDER_SITE_OTHER): Payer: PPO | Admitting: Family Medicine

## 2020-11-19 ENCOUNTER — Encounter: Payer: Self-pay | Admitting: Family Medicine

## 2020-11-19 VITALS — Ht 64.0 in

## 2020-11-19 DIAGNOSIS — F419 Anxiety disorder, unspecified: Secondary | ICD-10-CM | POA: Diagnosis not present

## 2020-11-19 DIAGNOSIS — F339 Major depressive disorder, recurrent, unspecified: Secondary | ICD-10-CM

## 2020-11-19 MED ORDER — HYDROXYZINE HCL 25 MG PO TABS: 12.5000 mg | ORAL_TABLET | Freq: Two times a day (BID) | ORAL | 1 refills | Status: DC | PRN

## 2020-11-19 NOTE — Telephone Encounter (Signed)
Needs appointment

## 2020-11-19 NOTE — Progress Notes (Signed)
Virtual Visit via Telephone Note I connected with [name] on 11/19/20 at 11:30 AM EDT by telephone and verified that I am speaking with the correct person using two identifiers.   I discussed the limitations, risks, security and privacy concerns of performing an evaluation and management service by telephone and the availability of in person appointments. I also discussed with the patient that there may be a patient responsible charge related to this service. The patient expressed understanding and agreed to proceed.  Location patient: home Location provider: work office Participants present for the call: patient, provider Patient did not have a visit in the prior 7 days to address this/these issue(s).  Chief Complaint  Patient presents with   Anxiety     patient states that she is needing a low dose nerve pill to help calm her down.   History of Present Illness: [name] is a 80 y.o.female with hx of DM II,HTN,fibromyalgia,cervical/thoracic myelopathy, and depression c/o worsening anxiety and requesting something to help her calm down. Because some of her chronic medical problems she needs helps with ADL's and IADL's.  Her daughter [name] has been her caregiver for years, she has helped with managing her finances and writing her checks.  Her daughter does not want to be her POA any longer and she is not willing to continue helping with her care, so she suggested to find somebody else to take care of her.  She has no other family, no brothers or sisters. Her husband dies a few years ago.  According to pt, her daughter is planning on moving out of [place] in 6 months. She is also living her 11 son,who has bipolar disorder and according to pt, he cannot survive alone.  She feels alone.  She has an appt with her lawyer next Monday to remove her daughter as [place], she does not have anybody in mind at this time. Negative for suicidal thoughts. She is on Duloxetine 60 mg  daily. Sleeping well, 6-8 hours.  Observations/Objective: Patient sounds cheerful and well on the phone. I do not appreciate any SOB. Speech and thought processing are grossly intact. Labile and anxious. Patient reported vitals:Ht 5' 4" (1.626 m)   BMI 35.93 kg/m   Assessment and Plan:  1. Anxiety disorder, unspecified type We discussed treatment options. She would like something she can take prn, we have to be cautious because the risk of side effects and medication interaction. She agrees with trying Hydroxyzine 12.5-25 mg bid as needed for anxiety. Another options if medication doe snot help , can be low dose benzo, we discussed some risks with this type of medications. Continue Duloxetine 60 mg daily.  - hydrOXYzine (ATARAX/VISTARIL) 25 MG tablet; Take 0.5-1 tablets (12.5-25 mg total) by mouth every 12 (twelve) hours as needed for anxiety.  Dispense: 30 tablet; Refill: 1  2. Depression, recurrent (HCC) Continue Duloxetine 60 mg daily.  Above problems exacerbated by recent stressful events. She needs assistance for some ADL's and IADL's. Because cervical and thoracic myelopathy she has hand contractures and gait instability she cannot write,sign,or drive. She can pay some bills on line but for some she needs to write weekly checks. She has an aid for a few hours daily that helps with transportation to appt and groceries shopping. She already met with a Education officer, museum. We discussed the possibility of assisted living or nursing home placement and she is open to it. She is familiar with [facility] in [place] and [facility] in [place].  Follow Up Instructions:  Return in about 4 weeks (around 12/17/2020).  I did not refer this patient for an OV in the next 24 hours for this/these issue(s).  I discussed the assessment and treatment plan with the patient. The patient was provided an opportunity to ask questions and all were answered. The patient agreed with the plan and  demonstrated an understanding of the instructions.   The patient was advised to call back or seek an in-person evaluation if the symptoms worsen or if the condition fails to improve as anticipated.  I provided 22 minutes of non-face-to-face time during this encounter.   [name], MD

## 2020-11-20 ENCOUNTER — Encounter: Payer: Self-pay | Admitting: Family Medicine

## 2020-11-23 ENCOUNTER — Other Ambulatory Visit: Payer: Self-pay

## 2020-11-23 ENCOUNTER — Encounter: Payer: Self-pay | Admitting: Cardiology

## 2020-11-23 ENCOUNTER — Ambulatory Visit: Payer: PPO | Admitting: Cardiology

## 2020-11-23 VITALS — BP 138/64 | HR 61 | Ht 65.0 in | Wt 209.8 lb

## 2020-11-23 DIAGNOSIS — I2510 Atherosclerotic heart disease of native coronary artery without angina pectoris: Secondary | ICD-10-CM

## 2020-11-23 DIAGNOSIS — I2584 Coronary atherosclerosis due to calcified coronary lesion: Secondary | ICD-10-CM

## 2020-11-23 DIAGNOSIS — I471 Supraventricular tachycardia: Secondary | ICD-10-CM

## 2020-11-23 DIAGNOSIS — G6289 Other specified polyneuropathies: Secondary | ICD-10-CM

## 2020-11-23 DIAGNOSIS — I700 Atherosclerosis of aorta: Secondary | ICD-10-CM

## 2020-11-23 DIAGNOSIS — I10 Essential (primary) hypertension: Secondary | ICD-10-CM

## 2020-11-23 MED ORDER — VERAPAMIL HCL 120 MG PO TABS: 120.0000 mg | ORAL_TABLET | Freq: Every day | ORAL | 1 refills | Status: DC | PRN

## 2020-11-23 NOTE — Assessment & Plan Note (Signed)
Continue with Crestor 10 mg once a day.

## 2020-11-23 NOTE — Patient Instructions (Signed)
Medication Instructions:  Your physician recommends that you continue on your current medications as directed. Please refer to the Current Medication list given to you today.  *If you need a refill on your cardiac medications before your next appointment, please call your pharmacy*   Lab Work: None ordered  If you have labs (blood work) drawn today and your tests are completely normal, you will receive your results only by: [facility] [place] (if you have MyChart) OR A paper copy in the mail If you have any lab test that is abnormal or we need to change your treatment, we will call you to review the results.   Testing/Procedures: None ordered   Follow-Up: At [facility], you and your health needs are our priority.  As part of our continuing mission to provide you with exceptional heart care, we have created designated Provider Care Teams.  These Care Teams include your primary Cardiologist (physician) and Advanced Practice Providers (APPs -  Physician Assistants and Nurse Practitioners) who all work together to provide you with the care you need, when you need it.  We recommend signing up for the patient portal called "MyChart".  Sign up information is provided on this After Visit Summary.  MyChart is used to connect with patients for Virtual Visits (Telemedicine).  Patients are able to view lab/test results, encounter notes, upcoming appointments, etc.  Non-urgent messages can be sent to your provider as well.   To learn more about what you can do with MyChart, go to [email or web address].    Your next appointment:   12 month(s)  The format for your next appointment:   In Person  Provider:   You may see Candee Furbish, MD or one of the following Advanced Practice Providers on your designated Care Team:   Cecilie Kicks, NP    Other Instructions

## 2020-11-23 NOTE — Assessment & Plan Note (Signed)
Scattered coronary artery calcifications noted on prior CT scan.  Prior catheterization showed no obstructive disease.  Continue with Crestor, blood pressure control.

## 2020-11-23 NOTE — Progress Notes (Signed)
Cardiology Office Note:    Date:  11/23/2020   ID:  [name], DOB 11/14/1940, MRN [id number]  PCP:  [name], [name], MD   [facility] Providers Cardiologist:  Candee Furbish, MD     Referring MD: [name], [name], MD    History of Present Illness:    [name] is a 80 y.o. female here for follow-up of SVT hypertension hyperlipidemia type 2 diabetes with neuropathy, fibromyalgia.  Dr. Wynonia Lawman used to see her previously, last in 2019.  Her SVT has been managed medically with verapamil.  Has had anxiety diabetes in the past.  Cardiac catheterization in 2017 showed normal coronary arteries.  Her troponin was minimally elevated at the time.  Feels left hand is weaking. Wound healed. Dr. [name].   She states that she had to relearn how to walk twice because of severe back issues.  Occasionally she may feel some palpitations but this is very rare.  Past Medical History:  Diagnosis Date   Anemia    during 1st pregnancy   Anxiety    Arthritis    Cancer ([place])    Basal - face   Complication of anesthesia    Depression    Diarrhea, functional    Dysrhythmia    Paroysmal Atrial Tachycardia   Essential hypertension    Fibromyalgia    GERD (gastroesophageal reflux disease)    H/O acute pancreatitis    Headache    History of hiatal hernia    Hyperlipemia    Neuropathy    Osteoporosis    Panic attacks    Paralysis ([place]) 01/06/2019   hands   PAT (paroxysmal atrial tachycardia) (HCC)    Pneumonia    PONV (postoperative nausea and vomiting)    PSVT (paroxysmal supraventricular tachycardia) (HCC)    Reflux    Restless leg syndrome    Spinal cord compression (HCC)    Type 2 diabetes mellitus (HCC)    Type II   Vertigo    not current    Past Surgical History:  Procedure Laterality Date   ABDOMINAL HYSTERECTOMY     ANKLE SURGERY Left    x 2   ANTERIOR CERVICAL DECOMP/DISCECTOMY FUSION N/A 11/12/2017   Procedure: Cervical seven to Thoracic one  Anterior cervical  decompression/discectomy/fusion with removal of old plate;  Surgeon: [name], MD;  Location: [place];  Service: Neurosurgery;  Laterality: N/A;   BACK SURGERY     T12-L1 fusion, Anterior Cervical fusion   CARDIAC CATHETERIZATION N/A 02/16/2015   Procedure: Left Heart Cath and Coronary Angiography;  Surgeon: Sherren Mocha, MD;  Location: [facility];  Service: Cardiovascular;  Laterality: N/A;   CHOLECYSTECTOMY     COLONOSCOPY     ESOPHAGOGASTRODUODENOSCOPY  12/21/2010   Procedure: ESOPHAGOGASTRODUODENOSCOPY (EGD);  Surgeon: Landry Dyke, MD;  Location: [facility];  Service: Endoscopy;  Laterality: N/A;   EXCISION MORTON'S NEUROMA Right    EYE SURGERY Bilateral    Cataract   FRACTURE SURGERY     ankle x 2    KNEE SURGERY Right    x 3- arthroscopy   NECK SURGERY     POSTERIOR CERVICAL FUSION/FORAMINOTOMY N/A 01/22/2018   Procedure: Cervical six-seven Posterior Decompression with Posterior Fixation Cervical Five to Thoracic Two;  Surgeon: [name], MD;  Location: [facility];  Service: Neurosurgery;  Laterality: N/A;  Cervical 6-7 Posteror decompression with posterior fixation Cervical 5 to Thoracic 2   ROTATOR CUFF REPAIR Right    TONSILLECTOMY  TOTAL KNEE ARTHROPLASTY Left 04/10/2017   Procedure: LEFT TOTAL KNEE ARTHROPLASTY;  Surgeon: Paralee Cancel, MD;  Location: [facility];  Service: Orthopedics;  Laterality: Left;  70 mins   UPPER GASTROINTESTINAL ENDOSCOPY     WRIST SURGERY Left    fracture    Current Medications: Current Meds  Medication Sig   acetaminophen (TYLENOL) 325 MG tablet Take 325 mg by mouth every 6 (six) hours as needed.   aspirin EC 81 MG tablet Take 81 mg by mouth at bedtime.    Calcium Carb-Cholecalciferol 600-200 MG-UNIT TABS Take 1 tablet by mouth in the morning and at bedtime.    carbamide peroxide (DEBROX) 6.5 % OTIC solution Place 5 drops into the left ear 2 (two) times daily.   Continuous Blood Gluc Receiver (DEXCOM G6 RECEIVER) DEVI 1 Device by  Does not apply route daily.   Continuous Blood Gluc Sensor (DEXCOM G6 SENSOR) MISC 1 kit by Does not apply route as directed.   Continuous Blood Gluc Transmit (DEXCOM G6 TRANSMITTER) MISC 1 kit by Does not apply route as directed.   Cyanocobalamin (VITAMIN B 12 PO) Take 1,000 mg by mouth daily.   digoxin (LANOXIN) 0.125 MG tablet Take 1 tablet (0.125 mg total) by mouth daily.   docusate sodium (COLACE) 100 MG capsule Take 100 mg by mouth daily. twice a day   DULoxetine (CYMBALTA) 30 MG capsule TAKE 2 CAPSULES BY MOUTH EVERY DAY   ezetimibe (ZETIA) 10 MG tablet TAKE 1 TABLET BY MOUTH EVERY DAY   hydrOXYzine (ATARAX/VISTARIL) 25 MG tablet Take 0.5-1 tablets (12.5-25 mg total) by mouth every 12 (twelve) hours as needed for anxiety.   insulin aspart (NOVOLOG FLEXPEN) 100 UNIT/ML FlexPen Inject 10 Units into the skin daily with breakfast AND 10 Units daily with lunch AND 12 Units daily with supper. Max daily 60 units per correction scale.   insulin glargine (LANTUS SOLOSTAR) 100 UNIT/ML Solostar Pen Inject 22 Units into the skin daily.   Insulin Pen Needle (BD PEN NEEDLE MICRO U/F) 32G X 6 MM MISC USE 4 TIMES A DAY NOVOLOG(3) AND LANTUS9(1)   meloxicam (MOBIC) 15 MG tablet TAKE 1 TABLET (15 MG TOTAL) BY MOUTH DAILY.   metoprolol succinate (TOPROL-XL) 25 MG 24 hr tablet TAKE 1 TABLET BY MOUTH DAILY. PLEASE MAKE OVERDUE APPT WITH DR. Marlou Porch BEFORE ANYMORE REFILLS.   Multiple Vitamins-Minerals (PRESERVISION AREDS 2 PO) Take 1 tablet by mouth 2 (two) times daily.    MYRBETRIQ 50 MG TB24 tablet TAKE 1 TABLET BY MOUTH EVERY DAY   omega-3 acid ethyl esters (LOVAZA) 1 g capsule Take 1 capsule (1 g total) by mouth 2 (two) times daily.   omeprazole (PRILOSEC) 20 MG capsule Take 1 capsule (20 mg total) by mouth daily.   PFIZER-BIONT COVID-19 VAC-TRIS SUSP injection    pregabalin (LYRICA) 75 MG capsule Take 1 capsule (75 mg total) by mouth 3 (three) times daily.   ramipril (ALTACE) 10 MG capsule TAKE 1 CAPSULE  (10 MG TOTAL) BY MOUTH EVERY EVENING.   RELION INSULIN SYRINGE 1ML/31G 31G X 5/16" 1 ML MISC Use as directed   rOPINIRole (REQUIP) 1 MG tablet TAKE 1 TABLET BY MOUTH EVERYDAY AT BEDTIME   rosuvastatin (CRESTOR) 10 MG tablet TAKE 1 TABLET BY MOUTH EVERY DAY   SHINGRIX injection    verapamil (VERELAN PM) 240 MG 24 hr capsule TAKE 1 CAPSULE BY MOUTH EVERY DAY   VITAMIN D PO Take 10,000 Units by mouth daily. 10,000 in the am and 5,000 in  the pm   [DISCONTINUED] verapamil (CALAN) 120 MG tablet Take 120 mg by mouth daily as needed (afib).      Allergies:   Amoxil [amoxicillin], Lisinopril, Chocolate, Demerol, and Other   Social History   Socioeconomic History   Marital status: Widowed    Spouse name: Glendell Docker   Number of children: 2   Years of education: Not on file   Highest education level: Not on file  Occupational History   Occupation: Retired  Tobacco Use   Smoking status: Former   Smokeless tobacco: Never   Tobacco comments:    smoke 1 cigarette every few weeks when the [place] lady came by.  Vaping Use   Vaping Use: Never used  Substance and Sexual Activity   Alcohol use: No   Drug use: No   Sexual activity: Not on file  Other Topics Concern   Not on file  Social History Narrative   Lives home alone.  Daughter [name] with her today.   Independent of ADLs.  Education HS.  Widowed.     Social Determinants of Health   Financial Resource Strain: Low Risk    Difficulty of Paying Living Expenses: Not hard at all  Food Insecurity: No Food Insecurity   Worried About Charity fundraiser in the Last Year: Never true   [place] in the Last Year: Never true  Transportation Needs: No Transportation Needs   Lack of Transportation (Medical): No   Lack of Transportation (Non-Medical): No  Physical Activity: Insufficiently Active   Days of Exercise per Week: 5 days   Minutes of Exercise per Session: 20 min  Stress: Stress Concern Present   Feeling of Stress : Rather much  Social  Connections: Socially Isolated   Frequency of Communication with Friends and Family: More than three times a week   Frequency of Social Gatherings with Friends and Family: Never   Attends Religious Services: Never   Marine scientist or Organizations: No   Attends Archivist Meetings: Never   Marital Status: Widowed     Family History: The patient's family history includes Heart failure in her mother; Hyperlipidemia in her maternal grandmother and mother; Lung cancer in her father; Peripheral vascular disease in her maternal grandmother.  ROS:   Please see the history of present illness.     All other systems reviewed and are negative.  EKGs/Labs/Other Studies Reviewed:    The following studies were reviewed today: Cardiac catheterization 2017-no coronary artery disease. Echocardiogram-EF 60  EKG:  EKG is  ordered today.  The ekg ordered today demonstrates sinus rhythm 61 nonspecific ST-T wave changes leftward axis, no significant change from prior  Recent Labs: No results found for requested labs within last 8760 hours.  Recent Lipid Panel    Component Value Date/Time   CHOL 148 12/12/2019 0922   TRIG 414 (H) 12/12/2019 0922   HDL 30 (L) 12/12/2019 0922   CHOLHDL 4.9 12/12/2019 0922   VLDL 73 (H) 02/15/2015 0514   LDLCALC  12/12/2019 1448     Comment:     . LDL cholesterol not calculated. Triglyceride levels greater than 400 mg/dL invalidate calculated LDL results. . Reference range: <100 . Desirable range <100 mg/dL for primary prevention;   <70 mg/dL for patients with CHD or diabetic patients  with > or = 2 CHD risk factors. Marland Kitchen LDL-C is now calculated using the Martin-Hopkins  calculation, which is a validated novel method providing  better accuracy than the  Friedewald equation in the  estimation of LDL-C.  Cresenciano Genre et al. Annamaria Helling. 0867;619(50): 2061-2068  (http://education.[employer].com/faq/FAQ164)      Risk Assessment/Calculations:           Physical Exam:    VS:  BP 138/64   Pulse 61   Ht 5' 5" (1.651 m)   Wt 209 lb 12.8 oz (95.2 kg)   SpO2 98%   BMI 34.91 kg/m     Wt Readings from Last 3 Encounters:  11/23/20 209 lb 12.8 oz (95.2 kg)  11/16/20 209 lb 4.8 oz (94.9 kg)  09/06/20 205 lb 12.8 oz (93.4 kg)     GEN:  Well nourished, well developed in no acute distress HEENT: Normal NECK: No JVD; No carotid bruits LYMPHATICS: No lymphadenopathy CARDIAC: RRR, no murmurs, rubs, gallops RESPIRATORY:  Clear to auscultation without rales, wheezing or rhonchi  ABDOMEN: Soft, non-tender, non-distended MUSCULOSKELETAL:  No edema; bilateral hand contractures noted SKIN: Warm and dry NEUROLOGIC:  Alert and oriented x 3 PSYCHIATRIC:  Normal affect   ASSESSMENT:    1. Essential hypertension, benign   2. SVT (supraventricular tachycardia) ([place])   3. Aortic atherosclerosis (HCC)   4. Other polyneuropathy   5. Coronary artery calcification    PLAN:    In order of problems listed above:  SVT (supraventricular tachycardia) (HCC) Overall doing well utilizing digoxin metoprolol and verapamil.  She has been on this regimen for many years.  No changes made in medical management.  Very rarely will she feel any palpitations.  Aortic atherosclerosis (HCC) Continue with Crestor 10 mg once a day.  Peripheral neuropathy She has had multiple back/neck surgeries.  Wound healing issues with low back.  Dr. [name] was her surgeon.  At 1 point did not think she would be able to walk again.  She has been doing exceptionally well.  Utilizes a walker.  Her hands are getting weaker however.  Coronary artery calcification Scattered coronary artery calcifications noted on prior CT scan.  Prior catheterization showed no obstructive disease.  Continue with Crestor, blood pressure control.       Medication Adjustments/Labs and Tests Ordered: Current medicines are reviewed at length with the patient today.  Concerns regarding  medicines are outlined above.  Orders Placed This Encounter  Procedures   EKG 12-Lead   Meds ordered this encounter  Medications   verapamil (CALAN) 120 MG tablet    Sig: Take 1 tablet (120 mg total) by mouth daily as needed (afib).    Dispense:  30 tablet    Refill:  1    Patient Instructions  Medication Instructions:  Your physician recommends that you continue on your current medications as directed. Please refer to the Current Medication list given to you today.  *If you need a refill on your cardiac medications before your next appointment, please call your pharmacy*   Lab Work: None ordered  If you have labs (blood work) drawn today and your tests are completely normal, you will receive your results only by: [place] (if you have MyChart) OR A paper copy in the mail If you have any lab test that is abnormal or we need to change your treatment, we will call you to review the results.   Testing/Procedures: None ordered   Follow-Up: At [facility], you and your health needs are our priority.  As part of our continuing mission to provide you with exceptional heart care, we have created designated Provider Care Teams.  These Care Teams include your primary  Cardiologist (physician) and Advanced Practice Providers (APPs -  Physician Assistants and Nurse Practitioners) who all work together to provide you with the care you need, when you need it.  We recommend signing up for the patient portal called "MyChart".  Sign up information is provided on this After Visit Summary.  MyChart is used to connect with patients for Virtual Visits (Telemedicine).  Patients are able to view lab/test results, encounter notes, upcoming appointments, etc.  Non-urgent messages can be sent to your provider as well.   To learn more about what you can do with MyChart, go to [email or web address].    Your next appointment:   12 month(s)  The format for your next appointment:   In  Person  Provider:   You may see Candee Furbish, MD or one of the following Advanced Practice Providers on your designated Care Team:   Cecilie Kicks, NP    Other Instructions    Signed, Candee Furbish, MD  11/23/2020 2:42 PM    [facility]

## 2020-11-23 NOTE — Assessment & Plan Note (Signed)
Overall doing well utilizing digoxin metoprolol and verapamil.  She has been on this regimen for many years.  No changes made in medical management.  Very rarely will she feel any palpitations.

## 2020-11-23 NOTE — Assessment & Plan Note (Signed)
She has had multiple back/neck surgeries.  Wound healing issues with low back.  Dr. [name] was her surgeon.  At 1 point did not think she would be able to walk again.  She has been doing exceptionally well.  Utilizes a walker.  Her hands are getting weaker however.

## 2020-11-24 ENCOUNTER — Telehealth: Payer: PPO | Admitting: *Deleted

## 2020-11-24 ENCOUNTER — Telehealth: Payer: Self-pay | Admitting: *Deleted

## 2020-11-24 NOTE — Telephone Encounter (Signed)
  Care Management   Follow Up Note   11/24/2020 Name: AHLIYA GLATT MRN: [id number] DOB: 12/18/1940   Referred by: [name], [name], MD Reason for referral : Chronic Care Management in patient with SVT, HTN, Type II Diabetes Mellitus, Morbid Obesity, Mild, Recurrent Major Depression, Anxiety Disorder and Fibromyalgia.  An unsuccessful telephone outreach was attempted today. The patient was referred to the case management team for assistance with care management and care coordination. A HIPAA compliant message was left on voicemail for patient, including contact information, encouraging patient to return LCSW's call at her earliest convenience.  LCSW will make a second initial telephone outreach call attempt within the next 5-7 business days, if a return call is not received from patient in the meantime.  Follow-Up Plan:  Referral placed to Care Guides to reschedule an initial telephone outreach call with LCSW.  Nat Christen LCSW Licensed Clinical Social Worker [place] [phone]

## 2020-11-29 DIAGNOSIS — E119 Type 2 diabetes mellitus without complications: Secondary | ICD-10-CM | POA: Diagnosis not present

## 2020-11-29 DIAGNOSIS — E1149 Type 2 diabetes mellitus with other diabetic neurological complication: Secondary | ICD-10-CM | POA: Diagnosis not present

## 2020-11-30 ENCOUNTER — Other Ambulatory Visit: Payer: Self-pay

## 2020-11-30 ENCOUNTER — Ambulatory Visit (INDEPENDENT_AMBULATORY_CARE_PROVIDER_SITE_OTHER): Payer: PPO | Admitting: Internal Medicine

## 2020-11-30 VITALS — BP 124/76 | HR 61 | Ht 65.0 in | Wt 206.0 lb

## 2020-11-30 DIAGNOSIS — Z794 Long term (current) use of insulin: Secondary | ICD-10-CM | POA: Diagnosis not present

## 2020-11-30 DIAGNOSIS — E1142 Type 2 diabetes mellitus with diabetic polyneuropathy: Secondary | ICD-10-CM

## 2020-11-30 DIAGNOSIS — E1165 Type 2 diabetes mellitus with hyperglycemia: Secondary | ICD-10-CM

## 2020-11-30 NOTE — Patient Instructions (Signed)
-   Increase Lantus 26  units ONCE a day  - Novolog 12 units with Breakfast and 14 units with Lunch and 16 units Supper  - Novolog correctional insulin: ADD extra units on insulin to your meal-time Novolog dose if your blood sugars are higher than 165. Use the scale below to help guide you:   Blood sugar before meal Number of units to inject  Less than 155 0 unit  156 -  180 1 units  181 -  205 2 units  206 -  230 3 units  231 -  255 4 units  256 -  280 5 units  281 -  305 6 units  306 - 330 7 units  331 - 355 8 units       HOW TO TREAT LOW BLOOD SUGARS (Blood sugar LESS THAN 70 MG/DL) Please follow the RULE OF 15 for the treatment of hypoglycemia treatment (when your (blood sugars are less than 70 mg/dL)   STEP 1: Take 15 grams of carbohydrates when your blood sugar is low, which includes:  3-4 GLUCOSE TABS  OR 3-4 OZ OF JUICE OR REGULAR SODA OR ONE TUBE OF GLUCOSE GEL    STEP 2: RECHECK blood sugar in 15 MINUTES STEP 3: If your blood sugar is still low at the 15 minute recheck --> then, go back to STEP 1 and treat AGAIN with another 15 grams of carbohydrates.

## 2020-11-30 NOTE — Progress Notes (Signed)
Name: ANTHA NIDAY  Age/ Sex: 80 y.o., female   MRN/ DOB: [id number], 1940-10-04     PCP: [name], [name], MD   Reason for Endocrinology Evaluation: Type 2 diabetes Mellitus  Initial Endocrine Consultative Visit:  12/28/2017    PATIENT IDENTIFIER: Ms. NATHALY DAWKINS is a 80 y.o. female with a past medical history of PAT, T2 DM, and urinary incontinence, Hx of pancreatitis. The patient has followed with Endocrinology clinic since 12/28/2017 for consultative assistance with management of her diabetes.  DIABETIC HISTORY:  Ms. Kanitz was diagnosed with T2DM > 10 years ago.  She is intolerant to metformin due to fecal incontinence on 2000 mg a day. Her hemoglobin A1c has ranged from 6.7% in 09/01/2017, peaking at 7.9% in 2011.   SUBJECTIVE:   During the last visit (03/09/2020): Her A1c was 7.2 %, we continued lantus and Novolog doses.   Today (11/30/2020): Ms. Poucher is here for a  follow up for diabetes management. She has not been to our clinic in 9 months.    She is accompanied by Judie Bonus  ,   She checks her blood sugars multiple  times daily through the Dexcom . The patient has had hypoglycemic episodes since the last clinic visit.   Saw cardiology recently for a F/U on CAD and SVT Saw PCP for depression and anxiety   Denies nausea, vomiting nor diarrhea   She is moving a nursing home  b  HOME DIABETES REGIMEN:  Lantus 22 units daily  Humalog 10 units with breakfast , 10 units with lunch and 12 units with supper CF: NovoLog (BG -130/35)    CONTINUOUS GLUCOSE MONITORING RECORD INTERPRETATION    Dates of Recording: 10/3-10/16/2022  Sensor description:Dexcom G6   Results statistics:   CGM use % of time 100  Average and SD 221/33  Time in range  10%  % Time Above 180 19  % Time above 250  < 19  % Time Below target <0    Glycemic patterns summary: Hyperglycemia noted through the day and night   Hyperglycemic episodes  postprandial   Hypoglycemic episodes  occurred N/A  Overnight periods: Stable but high    DIABETIC COMPLICATIONS: Microvascular complications:  Neuropathy  Denies: retinopathy, nephropathy  Last eye exam: Completed 06/2019   Macrovascular complications:    Denies: CAD, PVD, CVA   HISTORY:  Past Medical History:  Past Medical History:  Diagnosis Date   Anemia    during 1st pregnancy   Anxiety    Arthritis    Cancer ([place])    Basal - face   Complication of anesthesia    Depression    Diarrhea, functional    Dysrhythmia    Paroysmal Atrial Tachycardia   Essential hypertension    Fibromyalgia    GERD (gastroesophageal reflux disease)    H/O acute pancreatitis    Headache    History of hiatal hernia    Hyperlipemia    Neuropathy    Osteoporosis    Panic attacks    Paralysis ([place]) 01/06/2019   hands   PAT (paroxysmal atrial tachycardia) (HCC)    Pneumonia    PONV (postoperative nausea and vomiting)    PSVT (paroxysmal supraventricular tachycardia) (HCC)    Reflux    Restless leg syndrome    Spinal cord compression (HCC)    Type 2 diabetes mellitus (HCC)    Type II   Vertigo    not current   Past Surgical History:  Past Surgical History:  Procedure Laterality Date   ABDOMINAL HYSTERECTOMY     ANKLE SURGERY Left    x 2   ANTERIOR CERVICAL DECOMP/DISCECTOMY FUSION N/A 11/12/2017   Procedure: Cervical seven to Thoracic one  Anterior cervical decompression/discectomy/fusion with removal of old plate;  Surgeon: [name], MD;  Location: [facility];  Service: Neurosurgery;  Laterality: N/A;   BACK SURGERY     T12-L1 fusion, Anterior Cervical fusion   CARDIAC CATHETERIZATION N/A 02/16/2015   Procedure: Left Heart Cath and Coronary Angiography;  Surgeon: Sherren Mocha, MD;  Location: [facility];  Service: Cardiovascular;  Laterality: N/A;   CHOLECYSTECTOMY     COLONOSCOPY     ESOPHAGOGASTRODUODENOSCOPY  12/21/2010   Procedure: ESOPHAGOGASTRODUODENOSCOPY (EGD);  Surgeon: Landry Dyke, MD;   Location: [facility];  Service: Endoscopy;  Laterality: N/A;   EXCISION MORTON'S NEUROMA Right    EYE SURGERY Bilateral    Cataract   FRACTURE SURGERY     ankle x 2    KNEE SURGERY Right    x 3- arthroscopy   NECK SURGERY     POSTERIOR CERVICAL FUSION/FORAMINOTOMY N/A 01/22/2018   Procedure: Cervical six-seven Posterior Decompression with Posterior Fixation Cervical Five to Thoracic Two;  Surgeon: [name], MD;  Location: [facility];  Service: Neurosurgery;  Laterality: N/A;  Cervical 6-7 Posteror decompression with posterior fixation Cervical 5 to Thoracic 2   ROTATOR CUFF REPAIR Right    TONSILLECTOMY     TOTAL KNEE ARTHROPLASTY Left 04/10/2017   Procedure: LEFT TOTAL KNEE ARTHROPLASTY;  Surgeon: Paralee Cancel, MD;  Location: [facility];  Service: Orthopedics;  Laterality: Left;  70 mins   UPPER GASTROINTESTINAL ENDOSCOPY     WRIST SURGERY Left    fracture   Social History:  reports that she has quit smoking. She has never used smokeless tobacco. She reports that she does not drink alcohol and does not use drugs. Family History:  Family History  Problem Relation Age of Onset   Heart failure Mother    Hyperlipidemia Mother    Lung cancer Father        Lymphoma   Hyperlipidemia Maternal Grandmother    Peripheral vascular disease Maternal Grandmother      HOME MEDICATIONS: Allergies as of 11/30/2020       Reactions   Amoxil [amoxicillin] Swelling, Other (See Comments)   Lips Has patient had a PCN reaction causing immediate rash, facial/tongue/throat swelling, SOB or lightheadedness with hypotension:YES Has patient had a PCN reaction causing severe rash involving mucus membranes or skin necrosis:No Has patient had a PCN reaction that required hospitalization:No Has patient had a PCN reaction occurring within the last 10 years:Yes. If all of the above answers are "NO", then may proceed with Cephalosporin use.   Lisinopril Itching, Swelling   SWELLING REACTION UNSPECIFIED     Chocolate Hives   Demerol Nausea And Vomiting   Other Other (See Comments)   Raw food or nuts - GI pain        Medication List        Accurate as of November 30, 2020  1:11 PM. If you have any questions, ask your nurse or doctor.          STOP taking these medications    bisacodyl 10 MG suppository Commonly known as: Bisacodyl Laxative Stopped by: Dorita Sciara, MD   glucose blood test strip Stopped by: Dorita Sciara, MD   Premarin vaginal cream Generic drug: conjugated estrogens Stopped by: Mammie Lorenzo  Verda Cumins, MD       TAKE these medications    acetaminophen 325 MG tablet Commonly known as: TYLENOL Take 325 mg by mouth every 6 (six) hours as needed.   aspirin EC 81 MG tablet Take 81 mg by mouth at bedtime.   BD Pen Needle Micro U/F 32G X 6 MM Misc Generic drug: Insulin Pen Needle USE 4 TIMES A DAY NOVOLOG(3) AND LANTUS9(1)   Calcium Carb-Cholecalciferol 600-200 MG-UNIT Tabs Take 1 tablet by mouth in the morning and at bedtime.   Debrox 6.5 % OTIC solution Generic drug: carbamide peroxide Place 5 drops into the left ear 2 (two) times daily.   Dexcom G6 Receiver Devi 1 Device by Does not apply route daily.   Dexcom G6 Sensor Misc 1 kit by Does not apply route as directed.   Dexcom G6 Transmitter Misc 1 kit by Does not apply route as directed.   digoxin 0.125 MG tablet Commonly known as: LANOXIN Take 1 tablet (0.125 mg total) by mouth daily.   docusate sodium 100 MG capsule Commonly known as: COLACE Take 100 mg by mouth daily. twice a day   DULoxetine 30 MG capsule Commonly known as: CYMBALTA TAKE 2 CAPSULES BY MOUTH EVERY DAY   ezetimibe 10 MG tablet Commonly known as: ZETIA TAKE 1 TABLET BY MOUTH EVERY DAY   hydrOXYzine 25 MG tablet Commonly known as: ATARAX/VISTARIL Take 0.5-1 tablets (12.5-25 mg total) by mouth every 12 (twelve) hours as needed for anxiety.   Lantus SoloStar 100 UNIT/ML Solostar Pen Generic drug:  insulin glargine Inject 22 Units into the skin daily.   meloxicam 15 MG tablet Commonly known as: MOBIC TAKE 1 TABLET (15 MG TOTAL) BY MOUTH DAILY.   metoprolol succinate 25 MG 24 hr tablet Commonly known as: TOPROL-XL TAKE 1 TABLET BY MOUTH DAILY. PLEASE MAKE OVERDUE APPT WITH DR. Marlou Porch BEFORE ANYMORE REFILLS.   Myrbetriq 50 MG Tb24 tablet Generic drug: mirabegron ER TAKE 1 TABLET BY MOUTH EVERY DAY   NovoLOG FlexPen 100 UNIT/ML FlexPen Generic drug: insulin aspart Inject 10 Units into the skin daily with breakfast AND 10 Units daily with lunch AND 12 Units daily with supper. Max daily 60 units per correction scale.   omega-3 acid ethyl esters 1 g capsule Commonly known as: LOVAZA Take 1 capsule (1 g total) by mouth 2 (two) times daily.   omeprazole 20 MG capsule Commonly known as: PRILOSEC Take 1 capsule (20 mg total) by mouth daily.   Pfizer-BioNT COVID-19 Vac-TriS Susp injection Generic drug: COVID-19 mRNA Vac-TriS (Pfizer)   pregabalin 75 MG capsule Commonly known as: Lyrica Take 1 capsule (75 mg total) by mouth 3 (three) times daily.   PRESERVISION AREDS 2 PO Take 1 tablet by mouth 2 (two) times daily.   ramipril 10 MG capsule Commonly known as: ALTACE TAKE 1 CAPSULE (10 MG TOTAL) BY MOUTH EVERY EVENING.   RELION INSULIN SYRINGE 1ML/31G 31G X 5/16" 1 ML Misc Generic drug: Insulin Syringe-Needle U-100 Use as directed   rOPINIRole 1 MG tablet Commonly known as: REQUIP TAKE 1 TABLET BY MOUTH EVERYDAY AT BEDTIME   rosuvastatin 10 MG tablet Commonly known as: CRESTOR TAKE 1 TABLET BY MOUTH EVERY DAY   Shingrix injection Generic drug: Zoster Vaccine Adjuvanted   verapamil 240 MG 24 hr capsule Commonly known as: VERELAN PM TAKE 1 CAPSULE BY MOUTH EVERY DAY   verapamil 120 MG tablet Commonly known as: CALAN Take 1 tablet (120 mg total) by mouth daily as needed (  afib).   VITAMIN B 12 PO Take 1,000 mg by mouth daily.   VITAMIN D PO Take 10,000 Units by  mouth daily. 10,000 in the am and 5,000 in the pm         OBJECTIVE:   Vital Signs: BP 124/76 (BP Location: Left Arm, Patient Position: Sitting, Cuff Size: Large)   Pulse 61   Ht 5' 5"  (1.651 m)   Wt 206 lb (93.4 kg)   SpO2 98%   BMI 34.28 kg/m   Wt Readings from Last 3 Encounters:  11/30/20 206 lb (93.4 kg)  11/23/20 209 lb 12.8 oz (95.2 kg)  11/16/20 209 lb 4.8 oz (94.9 kg)     Exam: General: Pt appears well and is in NAD  Lungs: Clear with good BS bilat   Heart: RRR   Extremities: No pretibial edema.  Neuro: MS is good with appropriate affect, pt is alert and Ox3    DM foot exam:03/09/2020 The skin of the feet is intact without sores or ulcerations. The pedal pulses are 2+ on right and 2+ on left. The sensation is decreased to a screening 5.07, 10 gram monofilament bilaterally     DATA REVIEWED:  Lab Results  Component Value Date   HGBA1C 7.2 (A) 03/09/2020   HGBA1C 7.7 (A) 09/03/2019   HGBA1C 7.3 (A) 05/05/2019      ASSESSMENT / PLAN / RECOMMENDATIONS:   1) Type 2 Diabetes Mellitus, Poorly Controlled, With  neuropathic complications - Most recent A1c of 9.3  %. Goal A1c < 7.0 %.    -Patient has been noted with hyperglycemia, main barriers to diabetes self-care is depression -She has been needing assistance with daily activities and has been determined for her to move to a nursing home, she is struggling with decision and has been stress eating - intolerant to metformin, has questionable hx of pancreatitis hence reluctant to start GLP-1 agonists. Not a candidate for SGLT- 2 inhibitors due to recurrent UTI's -We will make the following adjustments  MEDICATIONS: Please Lantus 26 units once a day NovoLog  12 units with Breakfast and 14 units with Lunch and 16 units with  Supper  CF: (BG- 130/25)    EDUCATION / INSTRUCTIONS: BG monitoring instructions: Patient is instructed to check her blood sugars 4 times a day, before meals and bedtime. Call [facility]  Endocrinology clinic if: BG persistently < 70 I reviewed the Rule of 15 for the treatment of hypoglycemia in detail with the patient. Literature supplied.  2) Diabetic complications:  Eye: Does not have known diabetic retinopathy.  Neuro/ Feet: Does have known diabetic peripheral neuropathy .  Renal: Patient does not have known baseline CKD. She   is on an ACEI/ARB at present.        F/U in 4 months    signed electronically by: Mack Guise, MD  [facility] End[facility]  [facility] [address] [place], [place] [address] Phone: [phone] FAX: [phone]   CC: [name], [name], [place] [address] Phone: [phone]  Fax: [phone]  Return to Endocrinology clinic as below: Future Appointments  Date Time Provider [place]  12/06/2020 10:30 AM [facility] SOCIAL [place] [facility]  12/09/2020 10:20 AM Raulkar, Clide Deutscher, MD [facility] CPR  02/23/2021  1:45 PM [facility] PHARMACIST [facility]  11/29/2021 10:15 AM [facility]-NURSE HEALTH ADVISOR 2 [facility]

## 2020-12-02 ENCOUNTER — Encounter: Payer: Self-pay | Admitting: Internal Medicine

## 2020-12-02 DIAGNOSIS — E1165 Type 2 diabetes mellitus with hyperglycemia: Secondary | ICD-10-CM | POA: Insufficient documentation

## 2020-12-02 DIAGNOSIS — Z794 Long term (current) use of insulin: Secondary | ICD-10-CM | POA: Insufficient documentation

## 2020-12-02 LAB — POCT GLYCOSYLATED HEMOGLOBIN (HGB A1C): Hemoglobin A1C: 9.3 % — AB (ref 4.0–5.6)

## 2020-12-02 MED ORDER — BD PEN NEEDLE MICRO U/F 32G X 6 MM MISC: 1.0000 | Freq: Four times a day (QID) | 3 refills | Status: AC

## 2020-12-02 MED ORDER — LANTUS SOLOSTAR 100 UNIT/ML ~~LOC~~ SOPN: 26.0000 [IU] | PEN_INJECTOR | Freq: Every day | SUBCUTANEOUS | 3 refills | Status: AC

## 2020-12-02 MED ORDER — NOVOLOG FLEXPEN 100 UNIT/ML ~~LOC~~ SOPN: PEN_INJECTOR | SUBCUTANEOUS | 3 refills | Status: AC

## 2020-12-06 ENCOUNTER — Ambulatory Visit: Payer: PPO | Admitting: *Deleted

## 2020-12-06 DIAGNOSIS — I10 Essential (primary) hypertension: Secondary | ICD-10-CM

## 2020-12-06 DIAGNOSIS — M797 Fibromyalgia: Secondary | ICD-10-CM

## 2020-12-06 DIAGNOSIS — G6289 Other specified polyneuropathies: Secondary | ICD-10-CM

## 2020-12-06 DIAGNOSIS — Z794 Long term (current) use of insulin: Secondary | ICD-10-CM

## 2020-12-06 DIAGNOSIS — M159 Polyosteoarthritis, unspecified: Secondary | ICD-10-CM

## 2020-12-06 DIAGNOSIS — R531 Weakness: Secondary | ICD-10-CM

## 2020-12-06 DIAGNOSIS — R2681 Unsteadiness on feet: Secondary | ICD-10-CM

## 2020-12-06 DIAGNOSIS — E1142 Type 2 diabetes mellitus with diabetic polyneuropathy: Secondary | ICD-10-CM

## 2020-12-06 DIAGNOSIS — R5383 Other fatigue: Secondary | ICD-10-CM

## 2020-12-06 NOTE — Chronic Care Management (AMB) (Signed)
Chronic Care Management    Clinical Social Work Note  12/06/2020 Name: [name] MRN: [id number] DOB: 12/11/1940  [name] is a 80 y.o. year old female who is a primary care patient of [name], Malka So, MD. The CCM team was consulted to assist the patient with chronic disease management and/or care coordination needs related to: [employer], Level of Care Concerns, and Caregiver Stress.   Engaged with patient by telephone for initial visit in response to provider referral for social work chronic care management and care coordination services.   Consent to Services:  The patient was given information about Chronic Care Management services, agreed to services, and gave verbal consent prior to initiation of services.  Please see initial visit note for detailed documentation.   Patient agreed to services and consent obtained.   Assessment: Review of patient past medical history, allergies, medications, and health status, including review of relevant consultants reports was performed today as part of a comprehensive evaluation and provision of chronic care management and care coordination services.     SDOH (Social Determinants of Health) assessments and interventions performed:  SDOH Interventions    Flowsheet Row Most Recent Value  SDOH Interventions   Food Insecurity Interventions Intervention Not Indicated  Financial Strain Interventions Intervention Not Indicated  Housing Interventions Intervention Not Indicated  Intimate Partner Violence Interventions Intervention Not Indicated  Physical Activity Interventions Intervention Not Indicated  Stress Interventions Offered Community Wellness Resources, Provide Counseling, Patient Refused  Social Connections Interventions Intervention Not Indicated  Transportation Interventions Intervention Not Indicated  Depression Interventions/Treatment  Medication, Counseling, Patient refuses Treatment  [Reported that everything has  been working out between her and her two daughters.]        Advanced Directives Status: See Care Plan for related entries.  CCM Care Plan  Allergies  Allergen Reactions   Amoxil [Amoxicillin] Swelling and Other (See Comments)    Lips Has patient had a PCN reaction causing immediate rash, facial/tongue/throat swelling, SOB or lightheadedness with hypotension:YES Has patient had a PCN reaction causing severe rash involving mucus membranes or skin necrosis:No Has patient had a PCN reaction that required hospitalization:No Has patient had a PCN reaction occurring within the last 10 years:Yes. If all of the above answers are "NO", then may proceed with Cephalosporin use.    Lisinopril Itching and Swelling    SWELLING REACTION UNSPECIFIED    Chocolate Hives   Demerol Nausea And Vomiting   Other Other (See Comments)    Raw food or nuts - GI pain    Outpatient Encounter Medications as of 12/06/2020  Medication Sig Note   acetaminophen (TYLENOL) 325 MG tablet Take 325 mg by mouth every 6 (six) hours as needed.    aspirin EC 81 MG tablet Take 81 mg by mouth at bedtime.     Calcium Carb-Cholecalciferol 600-200 MG-UNIT TABS Take 1 tablet by mouth in the morning and at bedtime.     carbamide peroxide (DEBROX) 6.5 % OTIC solution Place 5 drops into the left ear 2 (two) times daily. 11/11/2019: As needed   Continuous Blood Gluc Receiver (DEXCOM G6 RECEIVER) DEVI 1 Device by Does not apply route daily.    Continuous Blood Gluc Sensor (DEXCOM G6 SENSOR) MISC 1 kit by Does not apply route as directed.    Continuous Blood Gluc Transmit (DEXCOM G6 TRANSMITTER) MISC 1 kit by Does not apply route as directed.    Cyanocobalamin (VITAMIN B 12 PO) Take 1,000 mg by mouth daily.  digoxin (LANOXIN) 0.125 MG tablet Take 1 tablet (0.125 mg total) by mouth daily.    docusate sodium (COLACE) 100 MG capsule Take 100 mg by mouth daily. twice a day    DULoxetine (CYMBALTA) 30 MG capsule TAKE 2 CAPSULES BY MOUTH  EVERY DAY    ezetimibe (ZETIA) 10 MG tablet TAKE 1 TABLET BY MOUTH EVERY DAY    hydrOXYzine (ATARAX/VISTARIL) 25 MG tablet Take 0.5-1 tablets (12.5-25 mg total) by mouth every 12 (twelve) hours as needed for anxiety.    insulin aspart (NOVOLOG FLEXPEN) 100 UNIT/ML FlexPen Max daily 75 units per correction scale    insulin glargine (LANTUS SOLOSTAR) 100 UNIT/ML Solostar Pen Inject 26 Units into the skin daily.    Insulin Pen Needle (BD PEN NEEDLE MICRO U/F) 32G X 6 MM MISC Inject 1 Device into the skin QID. USE 4 TIMES A DAY NOVOLOG(3) AND LANTUS9(1)    meloxicam (MOBIC) 15 MG tablet TAKE 1 TABLET (15 MG TOTAL) BY MOUTH DAILY.    metoprolol succinate (TOPROL-XL) 25 MG 24 hr tablet TAKE 1 TABLET BY MOUTH DAILY. PLEASE MAKE OVERDUE APPT WITH DR. Marlou Porch BEFORE ANYMORE REFILLS.    Multiple Vitamins-Minerals (PRESERVISION AREDS 2 PO) Take 1 tablet by mouth 2 (two) times daily.     MYRBETRIQ 50 MG TB24 tablet TAKE 1 TABLET BY MOUTH EVERY DAY    omega-3 acid ethyl esters (LOVAZA) 1 g capsule Take 1 capsule (1 g total) by mouth 2 (two) times daily.    omeprazole (PRILOSEC) 20 MG capsule Take 1 capsule (20 mg total) by mouth daily.    PFIZER-BIONT COVID-19 VAC-TRIS SUSP injection     pregabalin (LYRICA) 75 MG capsule Take 1 capsule (75 mg total) by mouth 3 (three) times daily.    ramipril (ALTACE) 10 MG capsule TAKE 1 CAPSULE (10 MG TOTAL) BY MOUTH EVERY EVENING.    RELION INSULIN SYRINGE 1ML/31G 31G X 5/16" 1 ML MISC Use as directed    rOPINIRole (REQUIP) 1 MG tablet TAKE 1 TABLET BY MOUTH EVERYDAY AT BEDTIME    rosuvastatin (CRESTOR) 10 MG tablet TAKE 1 TABLET BY MOUTH EVERY DAY    SHINGRIX injection     verapamil (CALAN) 120 MG tablet Take 1 tablet (120 mg total) by mouth daily as needed (afib).    verapamil (VERELAN PM) 240 MG 24 hr capsule TAKE 1 CAPSULE BY MOUTH EVERY DAY    VITAMIN D PO Take 10,000 Units by mouth daily. 10,000 in the am and 5,000 in the pm    No facility-administered encounter  medications on file as of 12/06/2020.    Patient Active Problem List   Diagnosis Date Noted   Type 2 diabetes mellitus with hyperglycemia, with long-term current use of insulin ([place]) 12/02/2020   Coronary artery calcification 11/23/2020   Anxiety disorder 11/19/2020   Aortic atherosclerosis ([place]) 09/20/2019   Hyperlipidemia associated with type 2 diabetes mellitus ([place]) 09/17/2019   Mild recurrent major depression ([place]) 09/17/2019   Type 2 diabetes mellitus with diabetic polyneuropathy, with long-term current use of insulin ([place]) 09/04/2019   Fatigue 09/03/2019   Dysuria 09/03/2019   Peripheral neuropathy 02/28/2019   Thoracic myelopathy 01/08/2019   Cervical spine fracture ([place]) 01/22/2018   Weakness 01/21/2018   Closed C7 fracture ([place]) 01/21/2018   Fall    Lower urinary tract infectious disease    Cervical spondylosis with myelopathy 11/12/2017   Tinnitus of both ears 10/19/2017   Rhinorrhea 09/26/2017   Chronic diarrhea 08/08/2017   S/P left TKA  04/10/2017   Unstable gait 01/12/2017   Generalized osteoarthritis of hand 01/12/2017   RLS (restless legs syndrome) 11/09/2016   GERD (gastroesophageal reflux disease) 07/13/2016   Morbid obesity ([place]) 06/01/2016   Vitamin D deficiency 06/01/2016   Vitamin B12 deficiency 03/27/2016   Chronic cough 03/16/2016   Left foot pain 03/16/2016   Degenerative drusen 07/13/2013   Type 2 diabetes mellitus without ophthalmic manifestations ([place]) 07/13/2013   DM (diabetes mellitus) type II controlled, neurological manifestation ([place]) 05/07/2012   SVT (supraventricular tachycardia) ([place]) 05/07/2012   Essential hypertension, benign 05/07/2012   Fibromyalgia 05/07/2012    Conditions to be addressed/monitored: HTN, DMII, and Osteoarthritis.  Limited Social Support, Level of Care Concerns, ADL/IADL Limitations, Social Isolation, Limited Access to Caregiver, and Lacks Knowledge of [employer].  Care Plan : LCSW Plan of Care  Updates  made by Francis Gaines, LCSW since 12/06/2020 12:00 AM     Problem: Social and Functional Symptoms impacting patient's overall mental healtha and QOL Resolved 12/06/2020  Priority: High     Long-Range Goal: Provide pt with resources, support and guidance to enhance her mental health, wellness and QOL Completed 12/06/2020  Start Date: 11/17/2020  Expected End Date: 12/06/2020  This Visit's Progress: On track  Recent Progress: On track  Priority: High  Note:   Current barriers:   Patient in need of assistance with connecting to community resources for Financial constraints related to needing help with paying of bills/managing bills , Limited social support, Level of care concerns, Mental Health Concerns , Family and relationship dysfunction, Inability to perform ADL's independently, and Lacks knowledge of community resources Acknowledges deficits with meeting this unmet need Patient is unable to independently navigate community resource options without care coordination support Clinical Goals:  demonstrate a reduction in symptoms related to :Anxiety and Depression  patient will work with SW to address concerns related to level of care needs, legal and personal support needs and mental health wellness patient will follow up with personal Attorney* as directed by SW Clinical Interventions:  11/18/20- Pt has contacted her personal Attorney and states she has an appointment scheduled. Pt continues to decline seeking placement at this time; " I want to see my lawyer first".  Pt feels her in-home caregivers are sufficient at this time.   Pt denies SI/HI and also declines counseling at this time; "I'm just lonely". CSW encouraged pt to consider getting involved and more active in the community- her church ramp/steps are not safe and thus has not been able to attend. CSW suggested she might consider volunteering or going to some [employer] activities; said her caregiver might would also go with  her (and drive).    11/17/20 CSW spoke with pt who shared that her local daughter [name]) alerted her over the weekend to her plans to "move out of state in 6 months and will no longer help her". Per pt, daughter has been managing her bills being paid online. Pt also reports having private duty caregivers from [place] and 5pm-7pm.  She has a life alert and does stay alone at night. Her caregivers help her with transportation to appointments, errands, etc.  May benefit from [facility] PT/OT/RN assessments- PCP to arrange if agrees Discussed options for increasing her in-home caregiver support hours as needed as well as facility placement options. Pt lives in [place] and worked at [employer] in [place]. 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 Pt has property/assets  and would be private pay in a facility- level of care needed would be determined prior to move. Pt may consider this; however, initially wants to pursue legal support to get things set as she prefers. CSW mentioned to pt there are agencies that offer Payee support and CSW will ask Care Guide to follow up with pt with info/resources for this if she decides to pursue further.  Pt also shared that her daughter is HCPOA and voices; "unfortunately".  CSW alerted pt to the importance of she making nay changes to her legal decisions soon and plans to do so through her existing personal Attorney.  Collaboration with [name], [name], MD regarding development and update of comprehensive plan of care as evidenced by provider attestation and co-signature Inter-disciplinary care team collaboration (see longitudinal plan of care) Assessment of needs, barriers , agencies contacted, as well as how impacting  Review various resources, discussed options and provided patient information about Financial constraints related to daughter stating she is moving out of state in 6 months and will no longer oversee paying of pt's  bills, Level of care concerns, ADL IADL limitations, Mental Health Concerns , Family and relationship dysfunction, and Inability to perform ADL's independently Collaborated with appropriate clinical care team members regarding patient needs Limited social support, Inability to perform ADL's independently, and Lacks knowledge of community resource:   , Anxiety, Depression, and chronic pain Referral placed via [facility] Patient interviewed and appropriate assessments performed Provided mental health counseling with regard to anxiety/depression  Provided patient with information about seeking placement, levels of care, payment process, legal rights re: HCPOA, Durable POA, etc and private duty care  Discussed plans with patient for ongoing care management follow up and provided patient with direct contact information for care management team Advised patient to contact her personal Attorney to request a meeting asap to discuss and change her POA documents and other legal decisions made in past to support her current wishes. Collaborated with primary care provider re: LCSW assessment and Care Plan Provided education and assistance to client regarding Advanced Directives. Provided education to patient/caregiver regarding level of care options. Other interventions provided: Depression screen reviewed  Solution-Focused Strategies  Active listening / Reflection utilized  Emotional Support Provided Problem [place] strategies reviewed Provided brief CBT  Consideration of in-home help encouraged  [place] of Attorney  Patient Goals:  -meet with your personal Attorney to plan to meet asap regarding your wishes -consider additional help in home as needed -versus- moving to a facility (Assisted Living level of care may be appropriate) - begin a notebook of services in my neighborhood or community - call 211 when I need some help - follow-up on any referrals for help I am  given - think ahead to make sure my need does not become an emergency - make a note about what I need to have by the phone or take with me, like an identification card or social security number have a back-up plan - have a back-up plan - make a list of family or friends that I can call  -  Follow Up Plan: Appointment scheduled for SW follow up with client by phone on:   to be determined by Nat Christen, LCSW, upon her return next week.    Problem: Improve My Quality of Life through [facility].   Priority: High     Long-Range Goal: Improve My Quality of Life through [facility]  Living Facility Placement.   Start Date: 12/06/2020  Expected End Date: 03/08/2021  This Visit's Progress: On track  Priority: High  Note:   Current Barriers:  Chronic disease management support, education, resource and referral needs related to Mobility Issues, Fall Risk, Weakness, Fatigue, Morbid Obesity and Chronic Fibromyalgia Pain. Limited ability to perform activities of daily living independently and inability to self-administer medications as prescribed.   Clinical Goal(s):  Patient will work with LCSW to identify and address any acute and/or chronic care coordination needs, as it relates to self-health management.   Patient and daughters desire for patient to receive placement into a long-term memory care assisted living facility.  LCSW will submit appropriate paperwork to [facility] to try and obtain prior approval and authorization for short-term skilled nursing placement. LCSW Interventions:  Inter-disciplinary care team collaboration (see longitudinal plan of care). Collaboration with Primary Care Physician, Dr. Betty [name] regarding development and update of comprehensive plan of care as evidenced by provider attestation and co-signature. Collaboration with Primary Care Physician, Dr. Betty [name] to request completion of an FL-2  Form. Supportive counseling and attentive listening skills were utilized when patient voiced concerns about not wanting to be a burden to her two daughters.   Patient will work with LCSW to identify long-term memory care assisted living facilities, and begin deciding on facilities of interest, from the list provided.   Once completed and signed FL-2 Form is obtained, LCSW agreed to fax to all long-term memory care assisted living facilities of interest, to try and pursue bed offers. Patient Goals/Self-Care Activities: Continue to receive care and supervision in the home, Monday-Sunday, from 8am until 8pm, through private duty caregivers, until placement is secured in a long-term memory care assisted living facility.   Receive weekly/bi-weekly calls from LCSW, in an effort to coordinate long-term memory care assisted living facility placement.   Review complete list of long-term memory care assisted living facilities and be prepared to provide LCSW with at least 3 facilities of interest, during our next scheduled telephone outreach call. Contact LCSW directly (# [phone]) if you have questions, need assistance, or if additional social work needs are identified between now and our next scheduled telephone outreach call.   Follow-Up Plan:  LCSW will make contact with patient on 12/15/2020 at 12:30pm     [facility] Licensed Clinical Social Worker [place] [phone]

## 2020-12-06 NOTE — Patient Instructions (Signed)
Visit Information   PATIENT GOALS:   Goals Addressed               This Visit's Progress     COMPLETED: Find Help in My Community (pt-stated)   On track     Timeframe:  Long-Range Goal Priority:  High Start Date:     11/17/20                        Expected End Date:   12/06/2020        Follow Up Date- to be scheduled by assigned LCSW upon her return next week.  --meet with your personal Attorney to plan to meet asap regarding your wishes -consider additional help in home as needed -versus- moving to a facility (Assisted Living level of care may be appropriate)  - begin a notebook of services in my neighborhood or community - call 211 when I need some help - follow-up on any referrals for help I am given - think ahead to make sure my need does not become an emergency - make a note about what I need to have by the phone or take with me, like an identification card or social security number have a back-up plan - have a back-up plan - make a list of family or friends that I can call   Why is this important?   Knowing how and where to find help for yourself or family in your neighborhood and community is an important skill.  You will want to take some steps to learn how.    Notes:       Improve My Quality of Life through [facility]. (pt-stated)   On track     Timeframe:  Long-Range Goal Priority:  High Start Date:   12/06/2020                          Expected End Date:  03/08/2021                 Follow-Up Date:  12/15/2020 at 12:30pm  Patient Goals/Self-Care Activities: Continue to receive care and supervision in the home, [place], from 8am until 8pm, through private duty caregivers, until placement is secured in a long-term memory care assisted living facility.   Receive weekly/bi-weekly calls from LCSW, in an effort to coordinate long-term memory care assisted living facility placement.   Review complete list of long-term  memory care assisted living facilities and be prepared to provide LCSW with at least 3 facilities of interest, during our next scheduled telephone outreach call. Contact LCSW directly (# [phone]) if you have questions, need assistance, or if additional social work needs are identified between now and our next scheduled telephone outreach call.          Consent to CCM Services: [name] was given information about Chronic Care Management services including:  CCM service includes personalized support from designated clinical staff supervised by her physician, including individualized plan of care and coordination with other care providers 24/7 contact phone numbers for assistance for urgent and routine care needs. Service will only be billed when office clinical staff spend 20 minutes or more in a month to coordinate care. Only one practitioner may furnish and bill the service in a calendar month. The patient may stop CCM services at any time (effective at the end of the month) by phone call to the office staff. The  patient will be responsible for cost sharing (co-pay) of up to 20% of the service fee (after annual deductible is met).  Patient agreed to services and verbal consent obtained.   Patient verbalizes understanding of instructions provided today and agrees to view in [place].   Telephone follow up appointment with care management team member scheduled for:  12/15/2020 at 12:30pm  Leah Christen LCSW Licensed Clinical Social Worker [facility] [place] [phone]   CLINICAL CARE PLAN: Patient Care Plan: CCM Pharmacy Care Plan     Problem Identified: Problem: Hypertension, Hyperlipidemia, Diabetes, Atrial Fibrillation, GERD and bone health      Long-Range Goal: Patient-Specific Goal   Start Date: 04/08/2020  Expected End Date: 04/08/2021  This Visit's Progress: On track  Priority: High  Note:   Current Barriers:  Unable to independently monitor therapeutic  efficacy Unable to achieve control of cholesterol   Pharmacist Clinical Goal(s):  Over the next 120 days, patient will achieve adherence to monitoring guidelines and medication adherence to achieve therapeutic efficacy achieve control of cholesterol as evidenced by next lipid panel  through collaboration with PharmD and provider.   Interventions: 1:1 collaboration with [name], [name], MD regarding development and update of comprehensive plan of care as evidenced by provider attestation and co-signature Inter-disciplinary care team collaboration (see longitudinal plan of care) Comprehensive medication review performed; medication list updated in electronic medical record  Hypertension (BP goal <140/90) -Controlled -Current treatment: Metoprolol succinate (Toprol- XL) 8m, 1 tablet once daily - in AM  Ramipril 161m 1 capsule every evening  Verapamil 24032m1 capsule once daily - in AM -Medications previously tried: none  -Current home readings: 120/70, 126/80  -Current dietary habits: did not discuss -Current exercise habits: PT exercises -Denies hypotensive/hypertensive symptoms -Educated on Importance of home blood pressure monitoring; -Counseled to monitor BP at home weekly, document, and provide log at future appointments -Counseled on diet and exercise extensively Recommended to continue current medication  Hyperlipidemia: (LDL goal < 70) -Uncontrolled -Current treatment: Ezetimibe 22m35m tablet once daily  Rosuvastatin 10 mg 1 tablet daily Lovaza 1 g twice daily -Medications previously tried: none -Current dietary patterns: patient limits fatty foods and is eating more greens and salads -Current exercise habits: PT exercises -Educated on Cholesterol goals;  Importance of limiting foods high in cholesterol; -Recommended switching Lovaza to Vascepa for better triglyceride lowering and ASCVD benefit  Diabetes (A1c goal <7%) -Not ideally controlled -Current  medications: Lantus inject 18 units daily Novolog inject 8  units with breakfast and lunch and 10 units with supper -Medications previously tried: n/a  -Current home glucose readings: uses Dexcom CGM -Denies hypoglycemic/hyperglycemic symptoms -Current meal patterns:  breakfast: eggs and bacon lunch: salad dinner: n.a snacks: n.a drinks: n/a -Current exercise: PT exercises -Educated on Exercise goal of 150 minutes per week; Carbohydrate counting and/or plate method -Counseled to check feet daily and get yearly eye exams -Counseled on diet and exercise extensively Recommended to continue current medication  Paroxysmal atrial tachycardia (Goal: regulate heart rate) -Controlled -Current treatment: Digoxin 0.125mg47mtablet one daily  Metoprolol succinate (Toprol- XL) 25mg,27mablet once daily  Verapamil 120mg, 69mblet as needed   Verapamil 240mg, 14msule once daily  -Medications previously tried: n/a -Home BP and HR readings: did not provide  -Counseled on  monitoring HR along with BP -Recommended to continue current medication   Neuropathy (Goal: minimize symptoms) -Controlled -Current treatment  Duloxetine 30 mg 1 capsule daily Pregabalin 50 mg 1 capsule twice daily -Medications  previously tried: none -Recommended to continue current medication  Urinary urgency (Goal: minimize symptoms) -Controlled -Current treatment  Mirabegron 50 mg 1 tablet daily -Medications previously tried: none  -Recommended to continue current medication Patient has noticed a big difference with the higher dose  GERD (Goal: minimize symptoms of GERD/acid reflux) -Controlled -Current treatment  Omeprazole 20 mg 1 capsule daily -Medications previously tried: none -Counseled on non-pharmacologic  management of symptoms such as elevating the head of your bed, avoiding eating 2-3 hours before bed, avoiding triggering foods such as acidic, spicy, or fatty foods, eating smaller meals, and  wearing clothes that are loose around the waist    Osteoarthritis/pain (Goal: minimize symptoms of pain) -Controlled -Current treatment  Tylenol 500 mg 2 tablets before breakfast Meloxicam 15 mg 1 tablet daily -Medications previously tried: Norco, Percocet  -Recommended to continue current medication  Health Maintenance -Vaccine gaps: 2nd shingles dose -Current therapy:  Bisacodyl 10 mg as needed Vitamin B12 1000 mcg 1 tablet daily Docusate 100 mg 1 capsule twice daily Preservision Areds 2 1 tablet twice daily Aspirin 81 mg 1 tablet daily -Educated on Cost vs benefit of each product must be carefully weighed by individual consumer -Patient is satisfied with current therapy and denies issues -Recommended to continue current medication  Patient Goals/Self-Care Activities Over the next 120 days, patient will:  - take medications as prescribed check glucose with CGM, document, and provide at future appointments check blood pressure weekly, document, and provide at future appointments  Follow Up Plan: Telephone follow up appointment with care management team member scheduled for:     Patient Care Plan: LCSW Plan of Care     Problem Identified: Social and Functional Symptoms impacting patient's overall mental healtha and QOL Resolved 12/06/2020  Priority: High     Long-Range Goal: Provide pt with resources, support and guidance to enhance her mental health, wellness and QOL Completed 12/06/2020  Start Date: 11/17/2020  Expected End Date: 12/06/2020  This Visit's Progress: On track  Recent Progress: On track  Priority: High  Note:   Current barriers:   Patient in need of assistance with connecting to community resources for Financial constraints related to needing help with paying of bills/managing bills , Limited social support, Level of care concerns, Mental Health Concerns , Family and relationship dysfunction, Inability to perform ADL's independently, and Lacks knowledge of  community resources Acknowledges deficits with meeting this unmet need Patient is unable to independently navigate community resource options without care coordination support Clinical Goals:  demonstrate a reduction in symptoms related to :Anxiety and Depression  patient will work with SW to address concerns related to level of care needs, legal and personal support needs and mental health wellness patient will follow up with personal Attorney* as directed by SW Clinical Interventions:  11/18/20- Pt has contacted her personal Attorney and states she has an appointment scheduled. Pt continues to decline seeking placement at this time; " I want to see my lawyer first".  Pt feels her in-home caregivers are sufficient at this time.   Pt denies SI/HI and also declines counseling at this time; "I'm just lonely". CSW encouraged pt to consider getting involved and more active in the community- her church ramp/steps are not safe and thus has not been able to attend. CSW suggested she might consider volunteering or going to some [employer] activities; said her caregiver might would also go with her (and drive).    11/17/20 CSW spoke with pt who shared that her local  daughter [name]) alerted her over the weekend to her plans to "move out of state in 6 months and will no longer help her". Per pt, daughter has been managing her bills being paid online. Pt also reports having private duty caregivers from [place] and 5pm-7pm.  She has a life alert and does stay alone at night. Her caregivers help her with transportation to appointments, errands, etc.  May benefit from [facility] PT/OT/RN assessments- PCP to arrange if agrees Discussed options for increasing her in-home caregiver support hours as needed as well as facility placement options. Pt lives in [place] and worked at [employer] in [place]. 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 Pt has property/assets and would be  private pay in a facility- level of care needed would be determined prior to move. Pt may consider this; however, initially wants to pursue legal support to get things set as she prefers. CSW mentioned to pt there are agencies that offer Payee support and CSW will ask Care Guide to follow up with pt with info/resources for this if she decides to pursue further.  Pt also shared that her daughter is HCPOA and voices; "unfortunately".  CSW alerted pt to the importance of she making nay changes to her legal decisions soon and plans to do so through her existing personal Attorney.  Collaboration with [name], [name], MD regarding development and update of comprehensive plan of care as evidenced by provider attestation and co-signature Inter-disciplinary care team collaboration (see longitudinal plan of care) Assessment of needs, barriers , agencies contacted, as well as how impacting  Review various resources, discussed options and provided patient information about Financial constraints related to daughter stating she is moving out of state in 6 months and will no longer oversee paying of pt's bills, Level of care concerns, ADL IADL limitations, Mental Health Concerns , Family and relationship dysfunction, and Inability to perform ADL's independently Collaborated with appropriate clinical care team members regarding patient needs Limited social support, Inability to perform ADL's independently, and Lacks knowledge of community resource:   , Anxiety, Depression, and chronic pain Referral placed via [place] [facility] Patient interviewed and appropriate assessments performed Provided mental health counseling with regard to anxiety/depression  Provided patient with information about seeking placement, levels of care, payment process, legal rights re: HCPOA, Durable POA, etc and private duty care  Discussed plans with patient for ongoing care management follow up and provided patient with direct contact information for  care management team Advised patient to contact her personal Attorney to request a meeting asap to discuss and change her POA documents and other legal decisions made in past to support her current wishes. Collaborated with primary care provider re: LCSW assessment and Care Plan Provided education and assistance to client regarding Advanced Directives. Provided education to patient/caregiver regarding level of care options. Other interventions provided: Depression screen reviewed  Solution-Focused Strategies  Active listening / Reflection utilized  Emotional Support Provided Problem [place] strategies reviewed Provided brief CBT  Consideration of in-home help encouraged  [place] of Attorney  Patient Goals:  -meet with your personal Attorney to plan to meet asap regarding your wishes -consider additional help in home as needed -versus- moving to a facility (Assisted Living level of care may be appropriate) - begin a notebook of services in my neighborhood or community - call 211 when I need some help - follow-up on any referrals for help I am  given - think ahead to make sure my need does not become an emergency - make a note about what I need to have by the phone or take with me, like an identification card or social security number have a back-up plan - have a back-up plan - make a list of family or friends that I can call  -  Follow Up Plan: Appointment scheduled for SW follow up with client by phone on:   to be determined by Leah Christen, LCSW, upon her return next week.    Problem Identified: Improve My Quality of Life through [facility].   Priority: High     Long-Range Goal: Improve My Quality of Life through [facility].   Start Date: 12/06/2020  Expected End Date: 03/08/2021  This Visit's Progress: On track  Priority: High  Note:   Current Barriers:   Chronic disease management support, education, resource and referral needs related to Mobility Issues, Fall Risk, Weakness, Fatigue, Morbid Obesity and Chronic Fibromyalgia Pain. Limited ability to perform activities of daily living independently and inability to self-administer medications as prescribed.   Clinical Goal(s):  Patient will work with LCSW to identify and address any acute and/or chronic care coordination needs, as it relates to self-health management.   Patient and daughters desire for patient to receive placement into a long-term memory care assisted living facility.  LCSW will submit appropriate paperwork to [facility] to try and obtain prior approval and authorization for short-term skilled nursing placement. LCSW Interventions:  Inter-disciplinary care team collaboration (see longitudinal plan of care). Collaboration with Primary Care Physician, Dr. Betty [name] regarding development and update of comprehensive plan of care as evidenced by provider attestation and co-signature. Collaboration with Primary Care Physician, Dr. Betty [name] to request completion of an FL-2 Form. Supportive counseling and attentive listening skills were utilized when patient voiced concerns about not wanting to be a burden to her two daughters.   Patient will work with LCSW to identify long-term memory care assisted living facilities, and begin deciding on facilities of interest, from the list provided.   Once completed and signed FL-2 Form is obtained, LCSW agreed to fax to all long-term memory care assisted living facilities of interest, to try and pursue bed offers. Patient Goals/Self-Care Activities: Continue to receive care and supervision in the home, Monday-Sunday, from 8am until 8pm, through private duty caregivers, until placement is secured in a long-term memory care assisted living facility.   Receive weekly/bi-weekly calls from LCSW, in an effort to coordinate long-term memory care  assisted living facility placement.   Review complete list of long-term memory care assisted living facilities and be prepared to provide LCSW with at least 3 facilities of interest, during our next scheduled telephone outreach call. Contact LCSW directly (# [phone]) if you have questions, need assistance, or if additional social work needs are identified between now and our next scheduled telephone outreach call.   Follow-Up Plan:  LCSW will make contact with patient on 12/15/2020 at 12:30pm

## 2020-12-08 ENCOUNTER — Encounter: Payer: Self-pay | Admitting: *Deleted

## 2020-12-09 ENCOUNTER — Encounter: Payer: Self-pay | Admitting: Physical Medicine and Rehabilitation

## 2020-12-09 ENCOUNTER — Encounter: Payer: PPO | Attending: Physical Medicine and Rehabilitation | Admitting: Physical Medicine and Rehabilitation

## 2020-12-09 ENCOUNTER — Other Ambulatory Visit: Payer: Self-pay

## 2020-12-09 VITALS — BP 125/72 | HR 64 | Ht 65.0 in

## 2020-12-09 DIAGNOSIS — G629 Polyneuropathy, unspecified: Secondary | ICD-10-CM | POA: Diagnosis not present

## 2020-12-09 DIAGNOSIS — M1711 Unilateral primary osteoarthritis, right knee: Secondary | ICD-10-CM | POA: Diagnosis not present

## 2020-12-09 NOTE — Progress Notes (Signed)
Subjective:    Patient ID: [name], female    DOB: [birth year], 80 y.o.   MRN: [id number]  HPI  An audio/video tele-health visit is felt to be the most appropriate encounter for this patient at this time. This is a follow up tele-visit via phone. The patient is at home. MD is at office.    [name] is a 80 year old woman who presents for follow-up of fibromyalgia, primary right knee OA, and diabetic peripheral neuropathy.   1) Fibromyalgia: -I spoke with daughter prior to this appointment and she mentioned that Lyrica has been helping -her pain levels have been high recently.  2) Primary OA of right knee: -She has been wearing a brace for her left knee and this has really helped. She received it from Dr. Alvan Dame at [facility]. -She would like a similar brace for her right knee -She has noted buckling when she walks -She would like to avoid surgery if possible. -she takes 2 tylenol in the morning and this provides relief after about 30 minutes -Has not been tested for RF recently it appears -has been to rheumatology and felt they were in a hurry. -Pain is worst in the morning when she gets up.  -does not want rheumatology referral.  -had steroid injection Friday without benefit.  -had three months of improved pain with Qutenza patch -asks about  -she has been wearing her brace -does not want to pursue knee replacement -the steroid pack has greatly helped but pain continues to be severe -he does not want to pursue knee replacement given the problems she had with her left knee. -the pain is grinding and horrible -she has had multiple arthroscopies before  3) Bilateral diabetic peripheral neuropathy: -lyrica does help -would like to proceed with Qutenza, this provided one month of relief and she was able to feel her feet touching the floor for the first time in a long time.  -has neuropathy on soles of both feet as well as in toes  Prior history: [name] is a 80 year old  woman who presents for follow-up of pain in her right anterior thigh as well as right hand flexor spasticity. She takes 2 tylenol in the morning and it takes about an hour and half for this to work. It usually lasts her until the ned of the day. She has also been having neuropathy which causes her to lose her balance. She asks me about a device that she saw on TV. She also plans to start physical therapy in January. She had an exacerbation of her pain since last visit and saw Dr. [name] and had a repeat MRI- there was nothing acute that warranted surgery. During this flare she took narcotics and did not eat much. During that time she was more constipated.   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 Her daughter accompanies her today.    She has a history of severe cervical, thoracic, and lumbar spinal stenosis s/p two ACDFs and thoracic decompression, fusion, and posterior lateral arthrodesis with pedicle screws.    She has been having severe right sided flexor spasticity since then causing her fingers to curve inward to her palms. She has been using hand splints and exercising her hands to keep them mobile. This is very disruptive to her as she would love to be able to return to cooking and knitting, activities that  she loves. She does not have other areas of spasticity. I have administered Botox to her right hand twice- the first time there was minimal benefit and the second time it made her arm weaker which lessened her ability to hold her walker. She prefers not to try the Botox again. She has been able to open her hand more but she has no functional improvement.   She ambulates using a walker and has had no recent falls. She is currently receiving PT for her right handed tone.    Her pain is neuropathic in nature and present in both hands and feet. She has uncontrolled diabetes and her  insulin dose was recently increased.   She also has fibromyalgia and takes Cymbalta 30mg  which helps. I had started her on Lyrica as well and this did provide relief but she did not like that it made her sleepy during the day. She would like to increase dose to 25mg  BID and currently has enough medication.   She has been continuing to take Meloxicam 15mg  daily.   Her right knee pain has recently flared. She has swelling but no erythema. She has received steroid injections for arthritis in the past. She would be interested in viscosupplementation.   She has been urinating very frequently at night and this is very bothersome to her. She is on Myrbetriq. She has not seen a urologist recently. In the past she saw a urologist at [facility] and had a good experience there. Would like to return there. Has never had urodynamic studies in the past.   Pain Inventory Average Pain 7 Pain Right Now 1 My pain is intermittent, burning and aching  In the last 24 hours, has pain interfered with the following? General activity 5 Relation with others 1 Enjoyment of life 0 What TIME of day is your pain at its worst? morning Sleep (in general) Good  Pain is worse with: walking, bending, standing and some activites Pain improves with: rest and medication Relief from Meds: 7      Family History  Problem Relation Age of Onset   Heart failure Mother    Hyperlipidemia Mother    Lung cancer Father        Lymphoma   Hyperlipidemia Maternal Grandmother    Peripheral vascular disease Maternal Grandmother    Social History   Socioeconomic History   Marital status: Widowed    Spouse name: Kielee Care   Number of children: 2   Years of education: 12   Highest education level: 12th grade  Occupational History   Occupation: Retired  Tobacco Use   Smoking status: Former    Passive exposure: Past   Smokeless tobacco: Never   Tobacco comments:    smoke 1 cigarette every few weeks when the  [place] lady came by.  Vaping Use   Vaping Use: Never used  Substance and Sexual Activity   Alcohol use: No   Drug use: No   Sexual activity: Not Currently  Other Topics Concern   Not on file  Social History Narrative   Lives home alone.  Daughter [name] with her today.   Independent of ADLs.  Education HS.  Widowed.     Social Determinants of Health   Financial Resource Strain: Low Risk    Difficulty of Paying Living Expenses: Not hard at all  Food Insecurity: No Food Insecurity   Worried About Charity fundraiser in the Last Year: Never true   [employer]  in the Last Year: Never true  Transportation Needs: No Transportation Needs   Lack of Transportation (Medical): No   Lack of Transportation (Non-Medical): No  Physical Activity: Insufficiently Active   Days of Exercise per Week: 5 days   Minutes of Exercise per Session: 20 min  Stress: No Stress Concern Present   Feeling of Stress : Only a little  Social Connections: Moderately Isolated   Frequency of Communication with Friends and Family: More than three times a week   Frequency of Social Gatherings with Friends and Family: More than three times a week   Attends Religious Services: 1 to 4 times per year   Active Member of [employer] or Organizations: No   Attends Archivist Meetings: Never   Marital Status: Widowed   Past Surgical History:  Procedure Laterality Date   ABDOMINAL HYSTERECTOMY     ANKLE SURGERY Left    x 2   ANTERIOR CERVICAL DECOMP/DISCECTOMY FUSION N/A 11/12/2017   Procedure: Cervical seven to Thoracic one  Anterior cervical decompression/discectomy/fusion with removal of old plate;  Surgeon: [name], MD;  Location: [place];  Service: Neurosurgery;  Laterality: N/A;   BACK SURGERY     T12-L1 fusion, Anterior Cervical fusion   CARDIAC CATHETERIZATION N/A 02/16/2015   Procedure: Left Heart Cath and Coronary Angiography;  Surgeon: Sherren Mocha, MD;  Location: [place] [facility];  Service:  Cardiovascular;  Laterality: N/A;   CHOLECYSTECTOMY     COLONOSCOPY     ESOPHAGOGASTRODUODENOSCOPY  12/21/2010   Procedure: ESOPHAGOGASTRODUODENOSCOPY (EGD);  Surgeon: Landry Dyke, MD;  Location: [facility];  Service: Endoscopy;  Laterality: N/A;   EXCISION MORTON'S NEUROMA Right    EYE SURGERY Bilateral    Cataract   FRACTURE SURGERY     ankle x 2    KNEE SURGERY Right    x 3- arthroscopy   NECK SURGERY     POSTERIOR CERVICAL FUSION/FORAMINOTOMY N/A 01/22/2018   Procedure: Cervical six-seven Posterior Decompression with Posterior Fixation Cervical Five to Thoracic Two;  Surgeon: [name], MD;  Location: [place];  Service: Neurosurgery;  Laterality: N/A;  Cervical 6-7 Posteror decompression with posterior fixation Cervical 5 to Thoracic 2   ROTATOR CUFF REPAIR Right    TONSILLECTOMY     TOTAL KNEE ARTHROPLASTY Left 04/10/2017   Procedure: LEFT TOTAL KNEE ARTHROPLASTY;  Surgeon: Paralee Cancel, MD;  Location: [facility];  Service: Orthopedics;  Laterality: Left;  70 mins   UPPER GASTROINTESTINAL ENDOSCOPY     WRIST SURGERY Left    fracture   Past Medical History:  Diagnosis Date   Anemia    during 1st pregnancy   Anxiety    Arthritis    Cancer ([place])    Basal - face   Complication of anesthesia    Depression    Diarrhea, functional    Dysrhythmia    Paroysmal Atrial Tachycardia   Essential hypertension    Fibromyalgia    GERD (gastroesophageal reflux disease)    H/O acute pancreatitis    Headache    History of hiatal hernia    Hyperlipemia    Neuropathy    Osteoporosis    Panic attacks    Paralysis ([place]) 01/06/2019   hands   PAT (paroxysmal atrial tachycardia) (HCC)    Pneumonia    PONV (postoperative nausea and vomiting)    PSVT (paroxysmal supraventricular tachycardia) (HCC)    Reflux    Restless leg syndrome    Spinal cord compression (HCC)    Type  2 diabetes mellitus (HCC)    Type II   Vertigo    not current   BP 125/72   Pulse 64   Ht 5\' 5"  (1.651  m)   PF 94 L/min   BMI 34.28 kg/m   Opioid Risk Score:   Fall Risk Score:  `1  Depression screen PHQ 2/9  Depression screen [facility] 2/9 12/09/2020 12/06/2020 11/18/2020 11/17/2020 11/16/2020 12/12/2019 11/11/2019  Decreased Interest 3 1 1 1 1 3 1   Down, Depressed, Hopeless 3 0 1 1 1 3 1   PHQ - 2 Score 6 1 2 2 2 6 2   Altered sleeping - 0 - 0 0 - 0  Tired, decreased energy - 0 - 0 1 - 1  Change in appetite - 0 - 0 0 - 1  Feeling bad or failure about yourself  - 1 - 1 1 - 1  Trouble concentrating - 1 - 1 1 - 0  Moving slowly or fidgety/restless - 0 - 0 0 - -  Suicidal thoughts - 0 - 0 0 - 1  PHQ-9 Score - 3 - 4 5 - 6  Difficult doing work/chores - Not difficult at all Somewhat difficult Somewhat difficult Very difficult - Somewhat difficult  Some recent data might be hidden     Review of Systems     Objective:   Physical Exam Not performed since patient was seen via phone.     Assessment & Plan:  Leah. 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    Spasticity: continue daily stretching.   -Continue use of splint at night and exercises to maintain range of motion in right hand.   Fibromyalgia and diabetic peripheral neuropathy -Continue Cymbalta 30mg   -Continue Lyrica to 50mg  TID, provided refill.  -Recommended intermittent fasting to improve insulin sensitivity. Currently eats breakfast at 8am and dinner at 5:30pm. Recommended pushing breakfast to 8:15am -Recommended no added sugars and minimal bread, pasta, rice.  -Continue physical therapy in January for balance deficits  Nocturia:  -Provided referral to urology for urodynamic studies to determine etiology of nocturia.   Constipation: -Constipation symptoms worsened with narcotic use. She has been  using dulcolax suppository as needed and she has been comfortable with this, may continue this.  -Also recommended high fiber diet.  -improving -continue metamucil.  -BM every 2-3 days now  -Provided list of following foods that help with constipation and highlighted a few: 1) prunes- contain high amounts of fiber.  2) apples- has a form of dietary fiber called pectin that accelerates stool movement and increases beneficial gut bacteria 3) pears- in addition to fiber, also high in fructose and sorbitol which have laxative effect 4) figs- contain an enzyme ficin which helps to speed colonic transit 5) kiwis- contain an enzyme actinidin that improves gut motility and reduces constipation 6) oranges- rich in pectin (like apples) 7) grapefruits- contain a flavanol naringenin which has a laxative effect 8) vegetables- rich in fiber and also great sources of folate, vitamin C, and K 9) artichoke- high in inulin, prebiotic great for the microbiome 10) chicory- increases stool frequency and softness (can be added to coffee) 11) rhubarb- laxative effect 12) sweet potato- high fiber 13) beans, peas, and lentils- contain both soluble and insoluble fiber 14) chia seeds- improves intestinal health  and gut flora 15) flaxseeds- laxative effect 16) whole grain rye bread- high in fiber 17) oat bran- high in soluble and insoluble fiber 18) kefir- softens stools -recommended to try at least one of these foods every day.  -drink 6-8 glasses of water per day -walk regularly, especially after meals.    Right knee pain 2/2 OA: Viscosupplementation offered no benefits.  -3 months improvement in pain with Qutenza, will repeat again in 3.5 months (based on appointment availability) -Discussed risks and benefits of surgery -advised to discuss with Dr. Alvan Dame whether another arthroscopy would be beneficial for her --Provided with a pain relief journal and discussed that it contains foods and lifestyle tips to  naturally help to improve pain. Discussed that these lifestyle strategies are also very good for health unlike some medications which can have negative side effects. Discussed that the act of keeping a journal can be therapeutic and helpful to realize patterns what helps to trigger and alleviate pain.   -provided resource for aging in place -recommended adaptive yoga with Mariane Masters -Recommended blue emu oil -Discussed current symptoms of pain and history of pain.  -Discussed benefits of exercise in reducing pain. -Sent referral for right knee brace to [facility] with clinic notes.  -recommended low dose naltrexone and she is interested in this.  -advised discussing surgery as an option with Dr. Alvan Dame -recommended borage oil and evening primrose oil -Discussed following foods that may reduce pain: 1) Ginger (especially studied for arthritis)- reduce leukotriene production to decrease inflammation 2) Blueberries- high in phytonutrients that decrease inflammation 3) Salmon- marine omega-3s reduce joint swelling and pain 4) Pumpkin seeds- reduce inflammation 5) dark chocolate- reduces inflammation 6) turmeric- reduces inflammation 7) tart cherries - reduce pain and stiffness 8) extra virgin olive oil - its compound olecanthal helps to block prostaglandins  9) chili peppers- can be eaten or applied topically via capsaicin 10) mint- helpful for headache, muscle aches, joint pain, and itching 11) garlic- reduces inflammation  Link to further information on diet for chronic pain: http://www.randall.com/ -continue steroid dose pack, helping  Left foot becomes purplish, tingling: -referred to vascular for eval for PVD  Right sided adhesive capsulitis: -recommended exercise to preserve range of motion  Diabetic peripheral neuropathy: -she has neuropathy of both feet -Discussed Qutenza as an option for neuropathic pain  control. Discussed that this is a capsaicin patch, stronger than capsaicin cream. Discussed that it is currently approved for diabetic peripheral neuropathy and post-herpetic neuralgia, but that it has also shown benefit in treating other forms of neuropathy. Provided patient with link to site to learn more about the patch: [employer].fr. Discussed that the patch would be placed in office and benefits usually last 3 months. Discussed that unintended exposure to capsaicin can cause severe irritation of eyes, mucous membranes, respiratory tract, and skin, but that Qutenza is a local treatment and does not have the systemic side effects of other nerve medications. Discussed that there may be pain, itching, erythema, and decreased sensory function associated with the application of Qutenza. Side effects usually subside within 1 week. A cold pack of analgesic medications can help with these side effects. Blood pressure can also be increased due to pain associated with administration of the patch.  4 patches of Qutenza was applied to the area of pain. Ice packs were applied during the procedure to ensure patient comfort. Blood pressure was monitored every 15 minutes. The patient tolerated the procedure well. Post-procedure instructions were given and follow-up has been scheduled.  She has agreed to be part of a case report.  Recommended 1 glass water with 1 TB apple cider vinegar before meals to reduce CBG spike, has additional health benefits, drink with straw to protect enamel.    >40 minutes spent in discussion of risks and benefits of Qutenza and obtaining informed consent, discussion of q90 day follow-up and expectation of improvement in pain with each repeat application, providing pain journal and discussing foods that can help with pain, discussing response to prior Qutenza applications, discussed benefits of adaptive yoga

## 2020-12-11 ENCOUNTER — Other Ambulatory Visit: Payer: Self-pay | Admitting: Cardiology

## 2020-12-11 DIAGNOSIS — I10 Essential (primary) hypertension: Secondary | ICD-10-CM

## 2020-12-13 DIAGNOSIS — M159 Polyosteoarthritis, unspecified: Secondary | ICD-10-CM

## 2020-12-13 DIAGNOSIS — Z794 Long term (current) use of insulin: Secondary | ICD-10-CM | POA: Diagnosis not present

## 2020-12-13 DIAGNOSIS — F339 Major depressive disorder, recurrent, unspecified: Secondary | ICD-10-CM

## 2020-12-13 DIAGNOSIS — I10 Essential (primary) hypertension: Secondary | ICD-10-CM

## 2020-12-13 DIAGNOSIS — E1142 Type 2 diabetes mellitus with diabetic polyneuropathy: Secondary | ICD-10-CM | POA: Diagnosis not present

## 2020-12-15 ENCOUNTER — Ambulatory Visit (INDEPENDENT_AMBULATORY_CARE_PROVIDER_SITE_OTHER): Payer: PPO | Admitting: *Deleted

## 2020-12-15 DIAGNOSIS — G6289 Other specified polyneuropathies: Secondary | ICD-10-CM

## 2020-12-15 DIAGNOSIS — R2681 Unsteadiness on feet: Secondary | ICD-10-CM

## 2020-12-15 DIAGNOSIS — I10 Essential (primary) hypertension: Secondary | ICD-10-CM

## 2020-12-15 DIAGNOSIS — M4712 Other spondylosis with myelopathy, cervical region: Secondary | ICD-10-CM

## 2020-12-15 DIAGNOSIS — M797 Fibromyalgia: Secondary | ICD-10-CM

## 2020-12-15 DIAGNOSIS — M159 Polyosteoarthritis, unspecified: Secondary | ICD-10-CM

## 2020-12-15 DIAGNOSIS — Z794 Long term (current) use of insulin: Secondary | ICD-10-CM

## 2020-12-15 DIAGNOSIS — R531 Weakness: Secondary | ICD-10-CM

## 2020-12-15 DIAGNOSIS — R5383 Other fatigue: Secondary | ICD-10-CM

## 2020-12-15 DIAGNOSIS — M4714 Other spondylosis with myelopathy, thoracic region: Secondary | ICD-10-CM

## 2020-12-15 DIAGNOSIS — E119 Type 2 diabetes mellitus without complications: Secondary | ICD-10-CM

## 2020-12-15 DIAGNOSIS — F419 Anxiety disorder, unspecified: Secondary | ICD-10-CM

## 2020-12-15 DIAGNOSIS — F339 Major depressive disorder, recurrent, unspecified: Secondary | ICD-10-CM

## 2020-12-15 NOTE — Chronic Care Management (AMB) (Signed)
Chronic Care Management    Clinical Social Work Note  12/15/2020 Name: [name] MRN: [id number] DOB: April 16, 1940  [name] is a 80 y.o. year old female who is a primary care patient of [name], Malka So, MD. The CCM team was consulted to assist the patient with chronic disease management and/or care coordination needs related to: Level of Care Concerns.   Engaged with patient by telephone for follow-up visit in response to provider referral for social work chronic care management and care coordination services.   Consent to Services:  The patient was given information about Chronic Care Management services, agreed to services, and gave verbal consent prior to initiation of services.  Please see initial visit note for detailed documentation.   Patient agreed to services and consent obtained.   Assessment: Review of patient past medical history, allergies, medications, and health status, including review of relevant consultants reports was performed today as part of a comprehensive evaluation and provision of chronic care management and care coordination services.     SDOH (Social Determinants of Health) assessments and interventions performed:    Advanced Directives Status: Not addressed in this encounter.  CCM Care Plan  Allergies  Allergen Reactions   Amoxil [Amoxicillin] Swelling and Other (See Comments)    Lips Has patient had a PCN reaction causing immediate rash, facial/tongue/throat swelling, SOB or lightheadedness with hypotension:YES Has patient had a PCN reaction causing severe rash involving mucus membranes or skin necrosis:No Has patient had a PCN reaction that required hospitalization:No Has patient had a PCN reaction occurring within the last 10 years:Yes. If all of the above answers are "NO", then may proceed with Cephalosporin use.    Lisinopril Itching and Swelling    SWELLING REACTION UNSPECIFIED    Chocolate Hives   Demerol Nausea And Vomiting   Other  Other (See Comments)    Raw food or nuts - GI pain    Outpatient Encounter Medications as of 12/15/2020  Medication Sig Note   acetaminophen (TYLENOL) 325 MG tablet Take 325 mg by mouth every 6 (six) hours as needed.    aspirin EC 81 MG tablet Take 81 mg by mouth at bedtime.     Calcium Carb-Cholecalciferol 600-200 MG-UNIT TABS Take 1 tablet by mouth in the morning and at bedtime.     carbamide peroxide (DEBROX) 6.5 % OTIC solution Place 5 drops into the left ear 2 (two) times daily. 11/11/2019: As needed   Continuous Blood Gluc Receiver (DEXCOM G6 RECEIVER) DEVI 1 Device by Does not apply route daily.    Continuous Blood Gluc Sensor (DEXCOM G6 SENSOR) MISC 1 kit by Does not apply route as directed.    Continuous Blood Gluc Transmit (DEXCOM G6 TRANSMITTER) MISC 1 kit by Does not apply route as directed.    Cyanocobalamin (VITAMIN B 12 PO) Take 1,000 mg by mouth daily.    digoxin (LANOXIN) 0.125 MG tablet Take 1 tablet (0.125 mg total) by mouth daily.    docusate sodium (COLACE) 100 MG capsule Take 100 mg by mouth daily. twice a day    DULoxetine (CYMBALTA) 30 MG capsule TAKE 2 CAPSULES BY MOUTH EVERY DAY    ezetimibe (ZETIA) 10 MG tablet TAKE 1 TABLET BY MOUTH EVERY DAY    hydrOXYzine (ATARAX/VISTARIL) 25 MG tablet Take 0.5-1 tablets (12.5-25 mg total) by mouth every 12 (twelve) hours as needed for anxiety.    insulin aspart (NOVOLOG FLEXPEN) 100 UNIT/ML FlexPen Max daily 75 units per correction scale  insulin glargine (LANTUS SOLOSTAR) 100 UNIT/ML Solostar Pen Inject 26 Units into the skin daily.    Insulin Pen Needle (BD PEN NEEDLE MICRO U/F) 32G X 6 MM MISC Inject 1 Device into the skin QID. USE 4 TIMES A DAY NOVOLOG(3) AND LANTUS9(1)    meloxicam (MOBIC) 15 MG tablet TAKE 1 TABLET (15 MG TOTAL) BY MOUTH DAILY.    metoprolol succinate (TOPROL-XL) 25 MG 24 hr tablet TAKE 1 TABLET BY MOUTH DAILY. PLEASE MAKE OVERDUE APPT WITH DR. Marlou Porch BEFORE ANYMORE REFILLS.    Multiple Vitamins-Minerals  (PRESERVISION AREDS 2 PO) Take 1 tablet by mouth 2 (two) times daily.     MYRBETRIQ 50 MG TB24 tablet TAKE 1 TABLET BY MOUTH EVERY DAY    omega-3 acid ethyl esters (LOVAZA) 1 g capsule Take 1 capsule (1 g total) by mouth 2 (two) times daily.    omeprazole (PRILOSEC) 20 MG capsule Take 1 capsule (20 mg total) by mouth daily.    PFIZER-BIONT COVID-19 VAC-TRIS SUSP injection     pregabalin (LYRICA) 75 MG capsule Take 1 capsule (75 mg total) by mouth 3 (three) times daily.    ramipril (ALTACE) 10 MG capsule TAKE 1 CAPSULE (10 MG TOTAL) BY MOUTH EVERY EVENING.    RELION INSULIN SYRINGE 1ML/31G 31G X 5/16" 1 ML MISC Use as directed    rOPINIRole (REQUIP) 1 MG tablet TAKE 1 TABLET BY MOUTH EVERYDAY AT BEDTIME    rosuvastatin (CRESTOR) 10 MG tablet TAKE 1 TABLET BY MOUTH EVERY DAY    SHINGRIX injection     verapamil (CALAN) 120 MG tablet Take 1 tablet (120 mg total) by mouth daily as needed (afib).    verapamil (VERELAN PM) 240 MG 24 hr capsule TAKE 1 CAPSULE BY MOUTH EVERY DAY    VITAMIN D PO Take 10,000 Units by mouth daily. 10,000 in the am and 5,000 in the pm    No facility-administered encounter medications on file as of 12/15/2020.    Patient Active Problem List   Diagnosis Date Noted   Type 2 diabetes mellitus with hyperglycemia, with long-term current use of insulin ([place]) 12/02/2020   Coronary artery calcification 11/23/2020   Anxiety disorder 11/19/2020   Aortic atherosclerosis ([place]) 09/20/2019   Hyperlipidemia associated with type 2 diabetes mellitus ([place]) 09/17/2019   Mild recurrent major depression ([place]) 09/17/2019   Type 2 diabetes mellitus with diabetic polyneuropathy, with long-term current use of insulin ([place]) 09/04/2019   Fatigue 09/03/2019   Dysuria 09/03/2019   Peripheral neuropathy 02/28/2019   Thoracic myelopathy 01/08/2019   Cervical spine fracture ([place]) 01/22/2018   Weakness 01/21/2018   Closed C7 fracture ([place]) 01/21/2018   Fall    Lower urinary tract infectious  disease    Cervical spondylosis with myelopathy 11/12/2017   Tinnitus of both ears 10/19/2017   Rhinorrhea 09/26/2017   Chronic diarrhea 08/08/2017   S/P left TKA 04/10/2017   Unstable gait 01/12/2017   Generalized osteoarthritis of hand 01/12/2017   RLS (restless legs syndrome) 11/09/2016   GERD (gastroesophageal reflux disease) 07/13/2016   Morbid obesity ([place]) 06/01/2016   Vitamin D deficiency 06/01/2016   Vitamin B12 deficiency 03/27/2016   Chronic cough 03/16/2016   Left foot pain 03/16/2016   Degenerative drusen 07/13/2013   Type 2 diabetes mellitus without ophthalmic manifestations ([place]) 07/13/2013   DM (diabetes mellitus) type II controlled, neurological manifestation ([place]) 05/07/2012   SVT (supraventricular tachycardia) ([place]) 05/07/2012   Essential hypertension, benign 05/07/2012   Fibromyalgia 05/07/2012    Conditions to  be addressed/monitored: HTN and DMII.  Limited Social Support, Level of Care Concerns, ADL/IADL Limitations, Social Isolation, Limited Access to Caregiver, and Lacks Knowledge of [employer].  Care Plan : LCSW Plan of Care  Updates made by Francis Gaines, LCSW since 12/15/2020 12:00 AM     Problem: Improve My Quality of Life through [place] [facility].   Priority: High     Long-Range Goal: Improve My Quality of Life through [place] [facility].   Start Date: 12/06/2020  Expected End Date: 03/08/2021  This Visit's Progress: On track  Recent Progress: On track  Priority: High  Note:   Current Barriers:  Chronic disease management support, education, resource and referral needs related to Mobility Issues, Fall Risk, Weakness, Fatigue, Morbid Obesity and Chronic Fibromyalgia Pain. Limited ability to perform activities of daily living independently and inability to self-administer medications as prescribed.   Clinical Goal(s):  Patient will work with LCSW to identify  and address any acute and/or chronic care coordination needs, as it relates to self-health management.   Patient and daughters desire for patient to receive placement into a long-term memory care assisted living facility.  LCSW will submit appropriate paperwork to [facility] to try and obtain prior approval and authorization for short-term skilled nursing placement. LCSW Interventions:  Inter-disciplinary care team collaboration (see longitudinal plan of care). Collaboration with Primary Care Physician, Dr. Betty [name] regarding development and update of comprehensive plan of care as evidenced by provider attestation and co-signature. Patient Goals/Self-Care Activities: Receive 24 hour care and supervision in the home, through a Location manager.   Private agency sitter hours increased from 12 hours per day, Monday - Sunday, to 24 hours per day, Monday - Sunday.   Continue to receive weekly/bi-weekly calls from LCSW, in an effort to coordinate long-term memory care assisted living facility placement.   FL-2 Form obtained from Primary Care Physician, Dr. Betty [name], and faxed to facilities of interest, which includes:  [place], [place] and [place].  Await bed offers, accepting all incoming telephone calls in the meantime. Be prepared to provide LCSW with at least 3 additional facilities of interest, during our next scheduled telephone outreach call, in the event that [place], [place] and [place] do not have bed availability. Contact LCSW directly (# [id number]) if you have questions, need assistance, or if additional social work needs are identified.   Follow-Up Plan:  LCSW will make contact with patient on 12/21/2020 at 8:15am     [place] Licensed Clinical Social Worker [place] [phone]

## 2020-12-15 NOTE — Patient Instructions (Addendum)
Visit Information  Patient verbalizes understanding of instructions provided today and agrees to view in [place].   Telephone follow up appointment with care management team member scheduled for:  12/21/2020 at 8:15am  [place] Licensed Clinical Social Worker [place] [phone]

## 2020-12-19 ENCOUNTER — Other Ambulatory Visit: Payer: Self-pay | Admitting: Family Medicine

## 2020-12-20 ENCOUNTER — Other Ambulatory Visit: Payer: Self-pay | Admitting: Cardiology

## 2020-12-21 ENCOUNTER — Ambulatory Visit: Payer: PPO | Admitting: *Deleted

## 2020-12-21 DIAGNOSIS — M4714 Other spondylosis with myelopathy, thoracic region: Secondary | ICD-10-CM

## 2020-12-21 DIAGNOSIS — F419 Anxiety disorder, unspecified: Secondary | ICD-10-CM

## 2020-12-21 DIAGNOSIS — R531 Weakness: Secondary | ICD-10-CM

## 2020-12-21 DIAGNOSIS — E119 Type 2 diabetes mellitus without complications: Secondary | ICD-10-CM

## 2020-12-21 DIAGNOSIS — E1140 Type 2 diabetes mellitus with diabetic neuropathy, unspecified: Secondary | ICD-10-CM

## 2020-12-21 DIAGNOSIS — R2681 Unsteadiness on feet: Secondary | ICD-10-CM

## 2020-12-21 DIAGNOSIS — I471 Supraventricular tachycardia: Secondary | ICD-10-CM

## 2020-12-21 DIAGNOSIS — M159 Polyosteoarthritis, unspecified: Secondary | ICD-10-CM

## 2020-12-21 DIAGNOSIS — M797 Fibromyalgia: Secondary | ICD-10-CM

## 2020-12-21 NOTE — Chronic Care Management (AMB) (Signed)
Chronic Care Management    Clinical Social Work Note  12/21/2020 Name: [name] MRN: [id number] DOB: August 13, 1940  MEAGON DUSKIN is a 80 y.o. year old female who is a primary care patient of [name], Malka So, MD. The CCM team was consulted to assist the patient with chronic disease management and/or care coordination needs related to: [employer] and Level of Care Concerns.   Engaged with patient by telephone for follow-up visit in response to provider referral for social work chronic care management and care coordination services.   Consent to Services:  The patient was given information about Chronic Care Management services, agreed to services, and gave verbal consent prior to initiation of services.  Please see initial visit note for detailed documentation.   Patient agreed to services and consent obtained.   Assessment: Review of patient past medical history, allergies, medications, and health status, including review of relevant consultants reports was performed today as part of a comprehensive evaluation and provision of chronic care management and care coordination services.     SDOH (Social Determinants of Health) assessments and interventions performed:    Advanced Directives Status: Not addressed in this encounter.  CCM Care Plan  Allergies  Allergen Reactions   Amoxil [Amoxicillin] Swelling and Other (See Comments)    Lips Has patient had a PCN reaction causing immediate rash, facial/tongue/throat swelling, SOB or lightheadedness with hypotension:YES Has patient had a PCN reaction causing severe rash involving mucus membranes or skin necrosis:No Has patient had a PCN reaction that required hospitalization:No Has patient had a PCN reaction occurring within the last 10 years:Yes. If all of the above answers are "NO", then may proceed with Cephalosporin use.    Lisinopril Itching and Swelling    SWELLING REACTION UNSPECIFIED    Chocolate Hives   Demerol  Nausea And Vomiting   Other Other (See Comments)    Raw food or nuts - GI pain    Outpatient Encounter Medications as of 12/21/2020  Medication Sig Note   acetaminophen (TYLENOL) 325 MG tablet Take 325 mg by mouth every 6 (six) hours as needed.    aspirin EC 81 MG tablet Take 81 mg by mouth at bedtime.     Calcium Carb-Cholecalciferol 600-200 MG-UNIT TABS Take 1 tablet by mouth in the morning and at bedtime.     carbamide peroxide (DEBROX) 6.5 % OTIC solution Place 5 drops into the left ear 2 (two) times daily. 11/11/2019: As needed   Continuous Blood Gluc Receiver (DEXCOM G6 RECEIVER) DEVI 1 Device by Does not apply route daily.    Continuous Blood Gluc Sensor (DEXCOM G6 SENSOR) MISC 1 kit by Does not apply route as directed.    Continuous Blood Gluc Transmit (DEXCOM G6 TRANSMITTER) MISC 1 kit by Does not apply route as directed.    Cyanocobalamin (VITAMIN B 12 PO) Take 1,000 mg by mouth daily.    digoxin (LANOXIN) 0.125 MG tablet Take 1 tablet (0.125 mg total) by mouth daily.    docusate sodium (COLACE) 100 MG capsule Take 100 mg by mouth daily. twice a day    DULoxetine (CYMBALTA) 30 MG capsule TAKE 2 CAPSULES BY MOUTH EVERY DAY    ezetimibe (ZETIA) 10 MG tablet TAKE 1 TABLET BY MOUTH EVERY DAY    hydrOXYzine (ATARAX/VISTARIL) 25 MG tablet Take 0.5-1 tablets (12.5-25 mg total) by mouth every 12 (twelve) hours as needed for anxiety.    insulin aspart (NOVOLOG FLEXPEN) 100 UNIT/ML FlexPen Max daily 75 units per correction scale  insulin glargine (LANTUS SOLOSTAR) 100 UNIT/ML Solostar Pen Inject 26 Units into the skin daily.    Insulin Pen Needle (BD PEN NEEDLE MICRO U/F) 32G X 6 MM MISC Inject 1 Device into the skin QID. USE 4 TIMES A DAY NOVOLOG(3) AND LANTUS9(1)    meloxicam (MOBIC) 15 MG tablet TAKE 1 TABLET (15 MG TOTAL) BY MOUTH DAILY.    metoprolol succinate (TOPROL-XL) 25 MG 24 hr tablet TAKE 1 TABLET BY MOUTH DAILY. PLEASE MAKE OVERDUE APPT WITH DR. Marlou Porch BEFORE ANYMORE REFILLS.     Multiple Vitamins-Minerals (PRESERVISION AREDS 2 PO) Take 1 tablet by mouth 2 (two) times daily.     MYRBETRIQ 50 MG TB24 tablet TAKE 1 TABLET BY MOUTH EVERY DAY    omega-3 acid ethyl esters (LOVAZA) 1 g capsule Take 1 capsule (1 g total) by mouth 2 (two) times daily.    omeprazole (PRILOSEC) 20 MG capsule Take 1 capsule (20 mg total) by mouth daily.    PFIZER-BIONT COVID-19 VAC-TRIS SUSP injection     pregabalin (LYRICA) 75 MG capsule Take 1 capsule (75 mg total) by mouth 3 (three) times daily.    ramipril (ALTACE) 10 MG capsule TAKE 1 CAPSULE (10 MG TOTAL) BY MOUTH EVERY EVENING.    RELION INSULIN SYRINGE 1ML/31G 31G X 5/16" 1 ML MISC Use as directed    rOPINIRole (REQUIP) 1 MG tablet TAKE 1 TABLET BY MOUTH EVERYDAY AT BEDTIME    rosuvastatin (CRESTOR) 10 MG tablet TAKE 1 TABLET BY MOUTH EVERY DAY    SHINGRIX injection     verapamil (CALAN) 120 MG tablet Take 1 tablet (120 mg total) by mouth daily as needed (afib).    verapamil (VERELAN PM) 240 MG 24 hr capsule TAKE 1 CAPSULE BY MOUTH EVERY DAY    VITAMIN D PO Take 10,000 Units by mouth daily. 10,000 in the am and 5,000 in the pm    No facility-administered encounter medications on file as of 12/21/2020.    Patient Active Problem List   Diagnosis Date Noted   Type 2 diabetes mellitus with hyperglycemia, with long-term current use of insulin ([place]) 12/02/2020   Coronary artery calcification 11/23/2020   Anxiety disorder 11/19/2020   Aortic atherosclerosis ([place]) 09/20/2019   Hyperlipidemia associated with type 2 diabetes mellitus ([place]) 09/17/2019   Mild recurrent major depression ([place]) 09/17/2019   Type 2 diabetes mellitus with diabetic polyneuropathy, with long-term current use of insulin ([place]) 09/04/2019   Fatigue 09/03/2019   Dysuria 09/03/2019   Peripheral neuropathy 02/28/2019   Thoracic myelopathy 01/08/2019   Cervical spine fracture ([place]) 01/22/2018   Weakness 01/21/2018   Closed C7 fracture ([place]) 01/21/2018   Fall    Lower  urinary tract infectious disease    Cervical spondylosis with myelopathy 11/12/2017   Tinnitus of both ears 10/19/2017   Rhinorrhea 09/26/2017   Chronic diarrhea 08/08/2017   S/P left TKA 04/10/2017   Unstable gait 01/12/2017   Generalized osteoarthritis of hand 01/12/2017   RLS (restless legs syndrome) 11/09/2016   GERD (gastroesophageal reflux disease) 07/13/2016   Morbid obesity ([place]) 06/01/2016   Vitamin D deficiency 06/01/2016   Vitamin B12 deficiency 03/27/2016   Chronic cough 03/16/2016   Left foot pain 03/16/2016   Degenerative drusen 07/13/2013   Type 2 diabetes mellitus without ophthalmic manifestations ([place]) 07/13/2013   DM (diabetes mellitus) type II controlled, neurological manifestation ([place]) 05/07/2012   SVT (supraventricular tachycardia) ([place]) 05/07/2012   Essential hypertension, benign 05/07/2012   Fibromyalgia 05/07/2012    Conditions to  be addressed/monitored: DMII and Anxiety.  Limited Social Support, Level of Care Concerns, ADL/IADL Limitations, Family and Relationship Dysfunction, Social Isolation, Limited Access to Caregiver, Memory Deficits, and Lacks Knowledge of [employer].  Care Plan : LCSW Plan of Care  Updates made by Francis Gaines, LCSW since 12/21/2020 12:00 AM     Problem: Improve My Quality of Life through [place] [facility]acement.   Priority: High     Long-Range Goal: Improve My Quality of Life through [place] [facility]acement.   Start Date: 12/06/2020  Expected End Date: 03/08/2021  This Visit's Progress: On track  Recent Progress: On track  Priority: High  Note:   Current Barriers:  Chronic disease management support, education, resource and referral needs related to Mobility Issues, Fall Risk, Weakness, Fatigue, Morbid Obesity and Chronic Fibromyalgia Pain. Limited ability to perform activities of daily living independently and inability to self-administer  medications as prescribed.   Clinical Goal(s):  Patient will work with LCSW to identify and address any acute and/or chronic care coordination needs, as it relates to self-health management.   Patient and daughters desire for patient to receive placement into a long-term memory care assisted living facility.  LCSW will submit appropriate paperwork to [facility] to try and obtain prior approval and authorization for short-term skilled nursing placement. LCSW Interventions:  Inter-disciplinary care team collaboration (see longitudinal plan of care). Collaboration with Primary Care Physician, Dr. Betty [name] regarding development and update of comprehensive plan of care as evidenced by provider attestation and co-signature. Patient Goals/Self-Care Activities: Continue to receive 24 hour care and supervision in the home, through a Location manager.   Continue to receive weekly/bi-weekly calls from LCSW, in an effort to coordinate long-term memory care assisted living facility placement.   FL-2 Form re-faxed to the following facilities of interest, as no bed offers were received from the initial fax:  [facility], [place] and [place].  Await bed offers, accepting all incoming telephone calls in the meantime. Please review list of long-term memory care assisted living facilities in [facility], already provided to you, and be prepared to provide LCSW with at least 3 additional facilities of interest, during our next scheduled telephone outreach call.   Continue to work on mending relationships with daughters, as you verbalized "they are only looking out for my best interest". Try to keep in mind these key points when communicating with your daughters:   ~ Active listening is critical.  Active listening is "reflecting back what the other person is saying", when you reflect back what your daughters are  saying, you're telling them that their being heard and that you  understand. ~ Willingness to forgive is extremely important.  Just saying "sorry" after an argument opens the door to a sincere conversation that permits you  to understand how your words and actions make the other person feel. ~ Communicate effectively.  Be clear and calmly state how you are feeling.  Also, speak your mind in a very heartfelt but gentle manner.  Saying  harsh words pierce deeply into a person's heart and can leave a painful wound, even if you never meant to hurt them. Contact LCSW directly (# [id number]) if you have questions, need assistance, or if additional social work needs are identified.   Follow-Up Plan:  LCSW will make contact with patient on 01/05/2021 at [place] LCSW Licensed Clinical Social Worker [place] [phone]

## 2020-12-21 NOTE — Patient Instructions (Signed)
Visit Information  (patient goals from clinical care plan here)  Patient verbalizes understanding of instructions provided today and agrees to view in [place].   Telephone follow up appointment with care management team member scheduled for:  01/05/2021 at [place] Clinical Social Worker [place] [phone]

## 2020-12-23 ENCOUNTER — Telehealth: Payer: Self-pay | Admitting: Internal Medicine

## 2020-12-23 NOTE — Telephone Encounter (Signed)
Patient's daughter [name] requests to be called at ph# [phone] re: Patient has been having low blood sugars (this morning blood sugars were 61 but Patient did not report being symptomatic)

## 2020-12-24 ENCOUNTER — Encounter (HOSPITAL_COMMUNITY): Payer: Self-pay | Admitting: Emergency Medicine

## 2020-12-24 ENCOUNTER — Emergency Department (HOSPITAL_COMMUNITY): Payer: PPO

## 2020-12-24 ENCOUNTER — Emergency Department (HOSPITAL_COMMUNITY)
Admission: EM | Admit: 2020-12-24 | Discharge: 2020-12-24 | Disposition: A | Discharge status: Home / Self Care | Payer: PPO | Attending: Emergency Medicine | Admitting: Emergency Medicine

## 2020-12-24 ENCOUNTER — Other Ambulatory Visit: Payer: Self-pay

## 2020-12-24 DIAGNOSIS — M25561 Pain in right knee: Secondary | ICD-10-CM | POA: Insufficient documentation

## 2020-12-24 DIAGNOSIS — Z794 Long term (current) use of insulin: Secondary | ICD-10-CM | POA: Insufficient documentation

## 2020-12-24 DIAGNOSIS — I480 Paroxysmal atrial fibrillation: Secondary | ICD-10-CM | POA: Diagnosis not present

## 2020-12-24 DIAGNOSIS — Z96642 Presence of left artificial hip joint: Secondary | ICD-10-CM | POA: Insufficient documentation

## 2020-12-24 DIAGNOSIS — Z85828 Personal history of other malignant neoplasm of skin: Secondary | ICD-10-CM | POA: Diagnosis not present

## 2020-12-24 DIAGNOSIS — H9201 Otalgia, right ear: Secondary | ICD-10-CM | POA: Insufficient documentation

## 2020-12-24 DIAGNOSIS — Z79899 Other long term (current) drug therapy: Secondary | ICD-10-CM | POA: Diagnosis not present

## 2020-12-24 DIAGNOSIS — Z7902 Long term (current) use of antithrombotics/antiplatelets: Secondary | ICD-10-CM | POA: Diagnosis not present

## 2020-12-24 DIAGNOSIS — R739 Hyperglycemia, unspecified: Secondary | ICD-10-CM

## 2020-12-24 DIAGNOSIS — R531 Weakness: Secondary | ICD-10-CM | POA: Diagnosis not present

## 2020-12-24 DIAGNOSIS — F419 Anxiety disorder, unspecified: Secondary | ICD-10-CM | POA: Diagnosis not present

## 2020-12-24 DIAGNOSIS — Z7982 Long term (current) use of aspirin: Secondary | ICD-10-CM | POA: Diagnosis not present

## 2020-12-24 DIAGNOSIS — I10 Essential (primary) hypertension: Secondary | ICD-10-CM | POA: Insufficient documentation

## 2020-12-24 DIAGNOSIS — E1165 Type 2 diabetes mellitus with hyperglycemia: Secondary | ICD-10-CM | POA: Insufficient documentation

## 2020-12-24 DIAGNOSIS — Z87891 Personal history of nicotine dependence: Secondary | ICD-10-CM | POA: Insufficient documentation

## 2020-12-24 LAB — CBC WITH DIFFERENTIAL/PLATELET
Abs Immature Granulocytes: 0.04 10*3/uL (ref 0.00–0.07)
Basophils Absolute: 0.1 10*3/uL (ref 0.0–0.1)
Basophils Relative: 1 %
Eosinophils Absolute: 0.4 10*3/uL (ref 0.0–0.5)
Eosinophils Relative: 4 %
HCT: 42.9 % (ref 36.0–46.0)
Hemoglobin: 14.7 g/dL (ref 12.0–15.0)
Immature Granulocytes: 1 %
Lymphocytes Relative: 31 %
Lymphs Abs: 2.8 10*3/uL (ref 0.7–4.0)
MCH: 29.9 pg (ref 26.0–34.0)
MCHC: 34.3 g/dL (ref 30.0–36.0)
MCV: 87.4 fL (ref 80.0–100.0)
Monocytes Absolute: 0.7 10*3/uL (ref 0.1–1.0)
Monocytes Relative: 7 %
Neutro Abs: 4.9 10*3/uL (ref 1.7–7.7)
Neutrophils Relative %: 56 %
Platelets: 172 10*3/uL (ref 150–400)
RBC: 4.91 MIL/uL (ref 3.87–5.11)
RDW: 12.8 % (ref 11.5–15.5)
WBC: 8.8 10*3/uL (ref 4.0–10.5)
nRBC: 0 % (ref 0.0–0.2)

## 2020-12-24 LAB — BASIC METABOLIC PANEL
Anion gap: 10 (ref 5–15)
BUN: 26 mg/dL — ABNORMAL HIGH (ref 8–23)
CO2: 23 mmol/L (ref 22–32)
Calcium: 9.2 mg/dL (ref 8.9–10.3)
Chloride: 103 mmol/L (ref 98–111)
Creatinine, Ser: 1.11 mg/dL — ABNORMAL HIGH (ref 0.44–1.00)
GFR, Estimated: 51 mL/min — ABNORMAL LOW (ref >60)
Glucose, Bld: 289 mg/dL — ABNORMAL HIGH (ref 70–99)
Potassium: 4.3 mmol/L (ref 3.5–5.1)
Sodium: 136 mmol/L (ref 135–145)

## 2020-12-24 LAB — CBG MONITORING, ED: Glucose-Capillary: 289 mg/dL — ABNORMAL HIGH (ref 70–99)

## 2020-12-24 NOTE — Telephone Encounter (Signed)
Patient states that she will call back as she is headed to the hospital.

## 2020-12-24 NOTE — ED Triage Notes (Signed)
Patient BIBA. Per patient blood sugar has been abnormally high 300+ for last several months. Patient called ambulance d/t anxiety and CBG of 268.

## 2020-12-24 NOTE — Telephone Encounter (Signed)
Patient gave okay for care giver Jolene to be giving information. Verbalized understanding and will follow up after hospital visit.

## 2020-12-24 NOTE — Discharge Instructions (Signed)
You were seen in the emergency department for fluctuating blood sugars.  You had lab work done that did not show any significant abnormalities and your chest x-ray did not show any pneumonia.  Your endocrinologist is recommending that you decrease your Lantus from 26 units in the morning to 22 units in the morning.  Please continue your other regular medications.  Follow-up with your endocrinologist as scheduled.  Return to the emergency department if any worsening or concerning symptoms

## 2020-12-24 NOTE — ED Provider Notes (Signed)
[facility] DEPT Provider Note   CSN: [id number] Arrival date & time: 12/24/20  9211     History No chief complaint on file.   [name] is a 80 y.o. female.  She is here by ambulance for evaluation of labile blood sugars.  She said her sugars have normally been in the 400s.  She saw her endocrinologist last week and they adjusted her medications and now her sugars have been bouncing around [place] and she had 1 low blood sugar of 60.  She ate some sweets with improvement in that.  She said this is making her very anxious and she "cannot deal with it."  She is also got more chronic complaints of right knee pain and sees Dr. Alvan Dame for possible knee replacement.  She also says her ear is her been ringing for a while and she does not like that.  The history is provided by the patient.  Hyperglycemia Blood sugar level PTA:  300 Severity:  Moderate Duration:  1 week Timing:  Intermittent Progression:  Unchanged Diabetes status:  Controlled with insulin Current diabetic therapy:  Lantus 26 units once a day, NovoLog  12 units with Breakfast and 14 units with Lunch and 16 units with  Supper Context: change in medication   Relieved by:  None tried Ineffective treatments:  None tried Associated symptoms: no abdominal pain, no blurred vision, no chest pain, no dysuria, no fever, no nausea, no shortness of breath and no vomiting       Past Medical History:  Diagnosis Date   Anemia    during 1st pregnancy   Anxiety    Arthritis    Cancer (HCC)    Basal - face   Complication of anesthesia    Depression    Diarrhea, functional    Dysrhythmia    Paroysmal Atrial Tachycardia   Essential hypertension    Fibromyalgia    GERD (gastroesophageal reflux disease)    H/O acute pancreatitis    Headache    History of hiatal hernia    Hyperlipemia    Neuropathy    Osteoporosis    Panic attacks    Paralysis ([place]) 01/06/2019   hands   PAT (paroxysmal  atrial tachycardia) (HCC)    Pneumonia    PONV (postoperative nausea and vomiting)    PSVT (paroxysmal supraventricular tachycardia) (HCC)    Reflux    Restless leg syndrome    Spinal cord compression (HCC)    Type 2 diabetes mellitus ([place])    Type II   Vertigo    not current    Patient Active Problem List   Diagnosis Date Noted   Type 2 diabetes mellitus with hyperglycemia, with long-term current use of insulin ([place]) 12/02/2020   Coronary artery calcification 11/23/2020   Anxiety disorder 11/19/2020   Aortic atherosclerosis ([place]) 09/20/2019   Hyperlipidemia associated with type 2 diabetes mellitus ([place]) 09/17/2019   Mild recurrent major depression ([place]) 09/17/2019   Type 2 diabetes mellitus with diabetic polyneuropathy, with long-term current use of insulin ([place]) 09/04/2019   Fatigue 09/03/2019   Dysuria 09/03/2019   Peripheral neuropathy 02/28/2019   Thoracic myelopathy 01/08/2019   Cervical spine fracture ([place]) 01/22/2018   Weakness 01/21/2018   Closed C7 fracture ([place]) 01/21/2018   Fall    Lower urinary tract infectious disease    Cervical spondylosis with myelopathy 11/12/2017   Tinnitus of both ears 10/19/2017   Rhinorrhea 09/26/2017   Chronic diarrhea 08/08/2017   S/P left TKA  04/10/2017   Unstable gait 01/12/2017   Generalized osteoarthritis of hand 01/12/2017   RLS (restless legs syndrome) 11/09/2016   GERD (gastroesophageal reflux disease) 07/13/2016   Morbid obesity ([place]) 06/01/2016   Vitamin D deficiency 06/01/2016   Vitamin B12 deficiency 03/27/2016   Chronic cough 03/16/2016   Left foot pain 03/16/2016   Degenerative drusen 07/13/2013   Type 2 diabetes mellitus without ophthalmic manifestations ([place]) 07/13/2013   DM (diabetes mellitus) type II controlled, neurological manifestation ([place]) 05/07/2012   SVT (supraventricular tachycardia) ([place]) 05/07/2012   Essential hypertension, benign 05/07/2012   Fibromyalgia 05/07/2012    Past Surgical History:   Procedure Laterality Date   ABDOMINAL HYSTERECTOMY     ANKLE SURGERY Left    x 2   ANTERIOR CERVICAL DECOMP/DISCECTOMY FUSION N/A 11/12/2017   Procedure: Cervical seven to Thoracic one  Anterior cervical decompression/discectomy/fusion with removal of old plate;  Surgeon: [name], MD;  Location: [facility];  Service: Neurosurgery;  Laterality: N/A;   BACK SURGERY     T12-L1 fusion, Anterior Cervical fusion   CARDIAC CATHETERIZATION N/A 02/16/2015   Procedure: Left Heart Cath and Coronary Angiography;  Surgeon: Sherren Mocha, MD;  Location: [facility];  Service: Cardiovascular;  Laterality: N/A;   CHOLECYSTECTOMY     COLONOSCOPY     ESOPHAGOGASTRODUODENOSCOPY  12/21/2010   Procedure: ESOPHAGOGASTRODUODENOSCOPY (EGD);  Surgeon: Landry Dyke, MD;  Location: [facility];  Service: Endoscopy;  Laterality: N/A;   EXCISION MORTON'S NEUROMA Right    EYE SURGERY Bilateral    Cataract   FRACTURE SURGERY     ankle x 2    KNEE SURGERY Right    x 3- arthroscopy   NECK SURGERY     POSTERIOR CERVICAL FUSION/FORAMINOTOMY N/A 01/22/2018   Procedure: Cervical six-seven Posterior Decompression with Posterior Fixation Cervical Five to Thoracic Two;  Surgeon: [name], MD;  Location: [facility];  Service: Neurosurgery;  Laterality: N/A;  Cervical 6-7 Posteror decompression with posterior fixation Cervical 5 to Thoracic 2   ROTATOR CUFF REPAIR Right    TONSILLECTOMY     TOTAL KNEE ARTHROPLASTY Left 04/10/2017   Procedure: LEFT TOTAL KNEE ARTHROPLASTY;  Surgeon: Paralee Cancel, MD;  Location: [facility];  Service: Orthopedics;  Laterality: Left;  70 mins   UPPER GASTROINTESTINAL ENDOSCOPY     WRIST SURGERY Left    fracture     OB History   No obstetric history on file.     Family History  Problem Relation Age of Onset   Heart failure Mother    Hyperlipidemia Mother    Lung cancer Father        Lymphoma   Hyperlipidemia Maternal Grandmother    Peripheral vascular disease Maternal  Grandmother     Social History   Tobacco Use   Smoking status: Former    Passive exposure: Past   Smokeless tobacco: Never   Tobacco comments:    smoke 1 cigarette every few weeks when the [place] lady came by.  Vaping Use   Vaping Use: Never used  Substance Use Topics   Alcohol use: No   Drug use: No    Home Medications Prior to Admission medications   Medication Sig Start Date End Date Taking? Authorizing Provider  acetaminophen (TYLENOL) 325 MG tablet Take 325 mg by mouth every 6 (six) hours as needed.    [provider]  aspirin EC 81 MG tablet Take 81 mg by mouth at bedtime.     [provider]  Calcium  Carb-Cholecalciferol 600-200 MG-UNIT TABS Take 1 tablet by mouth in the morning and at bedtime.     [provider]  carbamide peroxide (DEBROX) 6.5 % OTIC solution Place 5 drops into the left ear 2 (two) times daily. 04/23/19   [name], [name], MD  Continuous Blood Gluc Receiver (DEXCOM G6 RECEIVER) DEVI 1 Device by Does not apply route daily. 06/07/18   Shamleffer, Melanie Crazier, MD  Continuous Blood Gluc Sensor (DEXCOM G6 SENSOR) MISC 1 kit by Does not apply route as directed. 06/07/18   Shamleffer, Melanie Crazier, MD  Continuous Blood Gluc Transmit (DEXCOM G6 TRANSMITTER) MISC 1 kit by Does not apply route as directed. 06/07/18   Shamleffer, Melanie Crazier, MD  Cyanocobalamin (VITAMIN B 12 PO) Take 1,000 mg by mouth daily.    [provider]  digoxin (LANOXIN) 0.125 MG tablet Take 1 tablet (0.125 mg total) by mouth daily. 11/05/20   Jerline Pain, MD  docusate sodium (COLACE) 100 MG capsule Take 100 mg by mouth daily. twice a day    [provider]  DULoxetine (CYMBALTA) 30 MG capsule TAKE 2 CAPSULES BY MOUTH EVERY DAY 09/01/20   Raulkar, Clide Deutscher, MD  ezetimibe (ZETIA) 10 MG tablet TAKE 1 TABLET BY MOUTH EVERY DAY 11/05/20   [name], [name], MD  hydrOXYzine (ATARAX/VISTARIL) 25 MG tablet Take 0.5-1 tablets (12.5-25 mg total) by mouth  every 12 (twelve) hours as needed for anxiety. 11/19/20   [name], [name], MD  insulin aspart (NOVOLOG FLEXPEN) 100 UNIT/ML FlexPen Max daily 75 units per correction scale 12/02/20   Shamleffer, Melanie Crazier, MD  insulin glargine (LANTUS SOLOSTAR) 100 UNIT/ML Solostar Pen Inject 26 Units into the skin daily. 12/02/20   Shamleffer, Melanie Crazier, MD  Insulin Pen Needle (BD PEN NEEDLE MICRO U/F) 32G X 6 MM MISC Inject 1 Device into the skin QID. USE 4 TIMES A DAY NOVOLOG(3) AND LANTUS9(1) 12/02/20   Shamleffer, Melanie Crazier, MD  meloxicam (MOBIC) 15 MG tablet TAKE 1 TABLET (15 MG TOTAL) BY MOUTH DAILY. 11/02/20   Raulkar, Clide Deutscher, MD  metoprolol succinate (TOPROL-XL) 25 MG 24 hr tablet TAKE 1 TABLET BY MOUTH DAILY. PLEASE MAKE OVERDUE APPT WITH DR. Marlou Porch BEFORE ANYMORE REFILLS. 12/13/20   Jerline Pain, MD  Multiple Vitamins-Minerals (PRESERVISION AREDS 2 PO) Take 1 tablet by mouth 2 (two) times daily.     [provider]  MYRBETRIQ 50 MG TB24 tablet TAKE 1 TABLET BY MOUTH EVERY DAY 09/06/20   [name], [name], MD  omega-3 acid ethyl esters (LOVAZA) 1 g capsule Take 1 capsule (1 g total) by mouth 2 (two) times daily. 09/03/20   [name], [name], MD  omeprazole (PRILOSEC) 20 MG capsule Take 1 capsule (20 mg total) by mouth daily. 05/01/17   [name], [name], MD  PFIZER-BIONT COVID-19 VAC-TRIS SUSP injection  08/05/20   [provider]  pregabalin (LYRICA) 75 MG capsule Take 1 capsule (75 mg total) by mouth 3 (three) times daily. 10/25/20 10/25/21  Raulkar, Clide Deutscher, MD  ramipril (ALTACE) 10 MG capsule TAKE 1 CAPSULE (10 MG TOTAL) BY MOUTH EVERY EVENING. 03/12/20   [name], [name], MD  RELION INSULIN SYRINGE 1ML/31G 31G X 5/16" 1 ML MISC Use as directed 01/30/17   [name], [name], MD  rOPINIRole (REQUIP) 1 MG tablet TAKE 1 TABLET BY MOUTH EVERYDAY AT BEDTIME 10/04/20   [name], [name], MD  rosuvastatin (CRESTOR) 10 MG tablet TAKE 1 TABLET BY MOUTH EVERY DAY 09/27/20  [name], [name], MD   [facility] injection  08/05/20   [provider]  verapamil (CALAN) 120 MG tablet Take 1 tablet (120 mg total) by mouth daily as needed (afib). 11/23/20   Jerline Pain, MD  verapamil (VERELAN PM) 240 MG 24 hr capsule TAKE 1 CAPSULE BY MOUTH EVERY DAY 05/05/20   Jerline Pain, MD  VITAMIN D PO Take 10,000 Units by mouth daily. 10,000 in the am and 5,000 in the pm    [provider]    Allergies    Amoxil [amoxicillin], Lisinopril, Chocolate, Demerol, and Other  Review of Systems   Review of Systems  Constitutional:  Negative for fever.  HENT:  Positive for hearing loss.   Eyes:  Negative for blurred vision and visual disturbance.  Respiratory:  Negative for shortness of breath.   Cardiovascular:  Negative for chest pain.  Gastrointestinal:  Negative for abdominal pain, nausea and vomiting.  Genitourinary:  Negative for dysuria.  Musculoskeletal:  Positive for gait problem (Right knee, uses walker).  Skin:  Negative for rash.  Neurological:  Negative for headaches.   Physical Exam Updated Vital Signs BP (!) 131/92 (BP Location: Left Arm)   Pulse 69   Temp 97.9 F (36.6 C) (Oral)   Resp 20   Physical Exam Vitals and nursing note reviewed.  Constitutional:      General: She is not in acute distress.    Appearance: Normal appearance. She is well-developed.  HENT:     Head: Normocephalic and atraumatic.     Right Ear: There is impacted cerumen.     Left Ear: External ear normal.     Mouth/Throat:     Mouth: Mucous membranes are moist.     Pharynx: Oropharynx is clear.  Eyes:     Conjunctiva/sclera: Conjunctivae normal.  Cardiovascular:     Rate and Rhythm: Normal rate and regular rhythm.     Heart sounds: No murmur heard. Pulmonary:     Effort: Pulmonary effort is normal. No respiratory distress.     Breath sounds: Normal breath sounds.  Abdominal:     Palpations: Abdomen is soft.     Tenderness: There is no abdominal tenderness. There is no guarding  or rebound.  Musculoskeletal:        General: Tenderness (Right knee) present. No deformity. Normal range of motion.     Cervical back: Neck supple.  Skin:    General: Skin is warm and dry.  Neurological:     General: No focal deficit present.     Mental Status: She is alert.    ED Results / Procedures / Treatments   Labs (all labs ordered are listed, but only abnormal results are displayed) Labs Reviewed  BASIC METABOLIC PANEL - Abnormal; Notable for the following components:      Result Value   Glucose, Bld 289 (*)    BUN 26 (*)    Creatinine, Ser 1.11 (*)    GFR, Estimated 51 (*)    All other components within normal limits  CBG MONITORING, ED - Abnormal; Notable for the following components:   Glucose-Capillary 289 (*)    All other components within normal limits  CBC WITH DIFFERENTIAL/PLATELET    EKG None  Radiology DG Chest Port 1 View  Result Date: 12/24/2020 CLINICAL DATA:  Weakness. EXAM: PORTABLE CHEST 1 VIEW COMPARISON:  September 05, 2019. FINDINGS: The heart size and mediastinal contours are within normal limits. Both lungs are clear. The visualized skeletal structures are  unremarkable. IMPRESSION: No active disease. Electronically Signed   By: Marijo Conception M.D.   On: 12/24/2020 10:13    Procedures Procedures   Medications Ordered in ED Medications - No data to display  ED Course  I have reviewed the triage vital signs and the nursing notes.  Pertinent labs & imaging results that were available during my care of the patient were reviewed by me and considered in my medical decision making (see chart for details).  Clinical Course as of 12/24/20 1925  Ludwig Clarks Dec 24, 2020  1022 Chest x-ray interpreted by me as no acute pulmonary disease. [MB]  [id number] Caregiver was able to reach out to patient's endocrinologist and the recommendation is to decrease the morning Lantus from 26-22.  Family and patient are comfortable plan for discharge. [MB]    Clinical Course User  Index [MB] Hayden Rasmussen, MD   MDM Rules/Calculators/A&P                          This patient complains of labile blood sugars anxiety chronic right knee pain ringing in her ears; this involves an extensive number of treatment Options and is a complaint that carries with it a high risk of complications and Morbidity. The differential includes brittle diabetes, overmedication, undermedication, dehydration, infection  I ordered, reviewed and interpreted labs, which included CBC with normal white count normal hemoglobin, chemistries with elevated glucose stable renal function for her I ordered imaging studies which included chest x-ray and I independently    visualized and interpreted imaging which showed no acute findings Additional history obtained from EMS and patient's caregiver, family members Previous records obtained and reviewed in epic no recent admissions  After the interventions stated above, I reevaluated the patient and found patient to be feeling less anxious.  Her family was able to reach her endocrinologist and they have made recommendations for patient to change her insulin dosage.  Patient is comfortable plan for outpatient follow-up with them.  Return instructions discussed   Final Clinical Impression(s) / ED Diagnoses Final diagnoses:  Hyperglycemia    Rx / DC Orders ED Discharge Orders     None        Hayden Rasmussen, MD 12/24/20 1927

## 2020-12-27 ENCOUNTER — Other Ambulatory Visit: Payer: Self-pay | Admitting: Physical Medicine and Rehabilitation

## 2020-12-29 DIAGNOSIS — E1149 Type 2 diabetes mellitus with other diabetic neurological complication: Secondary | ICD-10-CM | POA: Diagnosis not present

## 2020-12-29 DIAGNOSIS — E119 Type 2 diabetes mellitus without complications: Secondary | ICD-10-CM | POA: Diagnosis not present

## 2020-12-31 ENCOUNTER — Encounter: Payer: Self-pay | Admitting: Family Medicine

## 2020-12-31 ENCOUNTER — Ambulatory Visit (INDEPENDENT_AMBULATORY_CARE_PROVIDER_SITE_OTHER): Payer: PPO | Admitting: Family Medicine

## 2020-12-31 VITALS — BP 138/92 | HR 65 | Temp 97.8°F | Resp 16 | Ht 65.0 in | Wt 207.8 lb

## 2020-12-31 DIAGNOSIS — G629 Polyneuropathy, unspecified: Secondary | ICD-10-CM

## 2020-12-31 DIAGNOSIS — I10 Essential (primary) hypertension: Secondary | ICD-10-CM | POA: Diagnosis not present

## 2020-12-31 DIAGNOSIS — F419 Anxiety disorder, unspecified: Secondary | ICD-10-CM | POA: Diagnosis not present

## 2020-12-31 DIAGNOSIS — H6121 Impacted cerumen, right ear: Secondary | ICD-10-CM

## 2020-12-31 DIAGNOSIS — E119 Type 2 diabetes mellitus without complications: Secondary | ICD-10-CM | POA: Diagnosis not present

## 2020-12-31 NOTE — Patient Instructions (Addendum)
A few things to remember from today's visit:  Type 2 diabetes mellitus without ophthalmic manifestations (HCC)  Anxiety disorder, unspecified type  Hearing loss of right ear due to cerumen impaction  If you need refills please call your pharmacy. Do not use My Chart to request refills or for acute issues that need immediate attention.   We could consider low dose of xanax or ativan if anxiety gets worse again. Stop Hydroxyzine. Keep appt with endocrinologist. Ear lavage done today, most wax was removed. Avoid Q tips. If possible, monitor blood pressure at home. Keep leg excoriations clean with soap and water, avoid tight braces.  Please be sure medication list is accurate. If a new problem present, please set up appointment sooner than planned today.

## 2020-12-31 NOTE — Progress Notes (Signed)
HPI: [name] is a 80 y.o. female with hx of depression,vit D def,DM II,HTN,GERD,cervical and thoracic myelopathy,chronic pain, and SVT here today with her caregiver to follow on recent ED visit. She was evaluated in the ED on 12/24/20 because hyperglycemia. BS was 350, she did not have symptoms but this caused a lot of stress. Transported to the ED via EMS.  BS's was 289 in the ED. DM II on Lantus 26 U daily and Novolog per sliding scale up to 75 U daily. She has not had hypoglycemic events. She does not feel like her diet is a contributing factor for elevated glucose.  Lab Results  Component Value Date   HGBA1C 9.3 (A) 12/02/2020  BS's 150-160's.  Negative for abdominal pain,nausea,vomiting, or urinary symptoms.  SBP mildly elevated today. Not checking BP's at home. Negative for unusual/frequent headache, CP,palpitations,SOB,new focal weakness,or edema. She is on Ramipril 10 mg daily,Verapamil 240 mg daily, and Metoprolol succinate 25 mg daily.  Lab Results  Component Value Date   CREATININE 1.11 (H) 12/24/2020   BUN 26 (H) 12/24/2020   NA 136 12/24/2020   K 4.3 12/24/2020   CL 103 12/24/2020   CO2 23 12/24/2020   Lab Results  Component Value Date   WBC 8.8 12/24/2020   HGB 14.7 12/24/2020   HCT 42.9 12/24/2020   MCV 87.4 12/24/2020   PLT 172 12/24/2020   She is still living at home. She needs assistance with ADL's, she has an aid that is helping.  States that now her daughters, [name] and [name],and her are "on the same page."  She is still considering moving to a NH but so far she has not been satisfied with the rooms size, too small, she will not be able to take her bed with her. [name] is still helping with witting her checks and will continue helping her even after she moves to [place].  She is not longer doing PT but in general she feels like is able to "get around" fine.  Anxiety: She is on Duloxetine 60 mg daily. Last visit, 11/19/20,  Hydroxyzine was recommended for acute anxiety but caused a lot of drowsiness, she tried 1/2 tab. She feels like anxiety is not as bad as it was last visit.  Right ear discomfort: For the past few days she has had right tinnitus and feeling like her ear "clogged up."Better last night after she did pull lobe and ear in different directions a few times. Negative for ear ache, drainage,sore throat,or nasal congestion. She has needed ear lavages in the past. No recent URI or travel. She has used OTC medication.  Skin lesions: Right pretibial area blisters after wearing a tight brace for a few days.Skin lesions are healing after she stopped wearing it. Applying OTC topical abx.  Review of Systems  Constitutional:  Positive for fatigue. Negative for activity change, appetite change, diaphoresis and unexpected weight change.  Eyes:  Negative for redness and visual disturbance.  Respiratory:  Negative for cough and wheezing.   Genitourinary:  Negative for decreased urine volume, dysuria and hematuria.  Musculoskeletal:  Positive for arthralgias, back pain and gait problem.  Neurological:  Negative for syncope and facial asymmetry.  Psychiatric/Behavioral:  The patient is nervous/anxious.   Rest see pertinent positives and negatives per HPI.  Current Outpatient Medications on File Prior to Visit  Medication Sig Dispense Refill   acetaminophen (TYLENOL) 325 MG tablet Take 325 mg by mouth every 6 (six) hours as needed.  aspirin EC 81 MG tablet Take 81 mg by mouth at bedtime.      Calcium Carb-Cholecalciferol 600-200 MG-UNIT TABS Take 1 tablet by mouth in the morning and at bedtime.      carbamide peroxide (DEBROX) 6.5 % OTIC solution Place 5 drops into the left ear 2 (two) times daily. 15 mL 0   Continuous Blood Gluc Receiver (DEXCOM G6 RECEIVER) DEVI 1 Device by Does not apply route daily. 1 Device 0   Continuous Blood Gluc Sensor (DEXCOM G6 SENSOR) MISC 1 kit by Does not apply route as  directed. 3 each 3   Continuous Blood Gluc Transmit (DEXCOM G6 TRANSMITTER) MISC 1 kit by Does not apply route as directed. 1 each 3   Cyanocobalamin (VITAMIN B 12 PO) Take 1,000 mg by mouth daily.     digoxin (LANOXIN) 0.125 MG tablet Take 1 tablet (0.125 mg total) by mouth daily. 90 tablet 0   docusate sodium (COLACE) 100 MG capsule Take 100 mg by mouth daily. twice a day     DULoxetine (CYMBALTA) 30 MG capsule TAKE 2 CAPSULES BY MOUTH EVERY DAY 180 capsule 2   ezetimibe (ZETIA) 10 MG tablet TAKE 1 TABLET BY MOUTH EVERY DAY 90 tablet 2   hydrOXYzine (ATARAX/VISTARIL) 25 MG tablet Take 0.5-1 tablets (12.5-25 mg total) by mouth every 12 (twelve) hours as needed for anxiety. 30 tablet 1   insulin aspart (NOVOLOG FLEXPEN) 100 UNIT/ML FlexPen Max daily 75 units per correction scale 75 mL 3   insulin glargine (LANTUS SOLOSTAR) 100 UNIT/ML Solostar Pen Inject 26 Units into the skin daily. 30 mL 3   Insulin Pen Needle (BD PEN NEEDLE MICRO U/F) 32G X 6 MM MISC Inject 1 Device into the skin QID. USE 4 TIMES A DAY NOVOLOG(3) AND LANTUS9(1) 400 each 3   meloxicam (MOBIC) 15 MG tablet TAKE 1 TABLET (15 MG TOTAL) BY MOUTH DAILY. 30 tablet 1   metoprolol succinate (TOPROL-XL) 25 MG 24 hr tablet TAKE 1 TABLET BY MOUTH DAILY. PLEASE MAKE OVERDUE APPT WITH DR. Marlou Porch BEFORE ANYMORE REFILLS. 30 tablet 12   Multiple Vitamins-Minerals (PRESERVISION AREDS 2 PO) Take 1 tablet by mouth 2 (two) times daily.      MYRBETRIQ 50 MG TB24 tablet TAKE 1 TABLET BY MOUTH EVERY DAY 90 tablet 1   omega-3 acid ethyl esters (LOVAZA) 1 g capsule Take 1 capsule (1 g total) by mouth 2 (two) times daily. 180 capsule 2   omeprazole (PRILOSEC) 20 MG capsule Take 1 capsule (20 mg total) by mouth daily. 90 capsule 2   PFIZER-BIONT COVID-19 VAC-TRIS SUSP injection      pregabalin (LYRICA) 75 MG capsule Take 1 capsule (75 mg total) by mouth 3 (three) times daily. 90 capsule 2   ramipril (ALTACE) 10 MG capsule TAKE 1 CAPSULE (10 MG TOTAL) BY  MOUTH EVERY EVENING. 90 capsule 2   RELION INSULIN SYRINGE 1ML/31G 31G X 5/16" 1 ML MISC Use as directed 100 each 1   rOPINIRole (REQUIP) 1 MG tablet TAKE 1 TABLET BY MOUTH EVERYDAY AT BEDTIME 90 tablet 1   rosuvastatin (CRESTOR) 10 MG tablet TAKE 1 TABLET BY MOUTH EVERY DAY 90 tablet 1   SHINGRIX injection      verapamil (CALAN) 120 MG tablet Take 1 tablet (120 mg total) by mouth daily as needed (afib). 30 tablet 1   verapamil (VERELAN PM) 240 MG 24 hr capsule TAKE 1 CAPSULE BY MOUTH EVERY DAY 90 capsule 3   VITAMIN D PO Take  10,000 Units by mouth daily. 10,000 in the am and 5,000 in the pm     No current facility-administered medications on file prior to visit.    Past Medical History:  Diagnosis Date   Anemia    during 1st pregnancy   Anxiety    Arthritis    Cancer ([place])    Basal - face   Complication of anesthesia    Depression    Diarrhea, functional    Dysrhythmia    Paroysmal Atrial Tachycardia   Essential hypertension    Fibromyalgia    GERD (gastroesophageal reflux disease)    H/O acute pancreatitis    Headache    History of hiatal hernia    Hyperlipemia    Neuropathy    Osteoporosis    Panic attacks    Paralysis ([place]) 01/06/2019   hands   PAT (paroxysmal atrial tachycardia) (HCC)    Pneumonia    PONV (postoperative nausea and vomiting)    PSVT (paroxysmal supraventricular tachycardia) (HCC)    Reflux    Restless leg syndrome    Spinal cord compression (HCC)    Type 2 diabetes mellitus (HCC)    Type II   Vertigo    not current   Allergies  Allergen Reactions   Amoxil [Amoxicillin] Swelling and Other (See Comments)    Lips Has patient had a PCN reaction causing immediate rash, facial/tongue/throat swelling, SOB or lightheadedness with hypotension:YES Has patient had a PCN reaction causing severe rash involving mucus membranes or skin necrosis:No Has patient had a PCN reaction that required hospitalization:No Has patient had a PCN reaction occurring  within the last 10 years:Yes. If all of the above answers are "NO", then may proceed with Cephalosporin use.    Lisinopril Itching and Swelling    SWELLING REACTION UNSPECIFIED    Chocolate Hives   Demerol Nausea And Vomiting   Other Other (See Comments)    Raw food or nuts - GI pain    Social History   Socioeconomic History   Marital status: Widowed    Spouse name: Majestic Molony   Number of children: 2   Years of education: 12   Highest education level: 12th grade  Occupational History   Occupation: Retired  Tobacco Use   Smoking status: Former    Passive exposure: Past   Smokeless tobacco: Never   Tobacco comments:    smoke 1 cigarette every few weeks when the [place] lady came by.  Vaping Use   Vaping Use: Never used  Substance and Sexual Activity   Alcohol use: No   Drug use: No   Sexual activity: Not Currently  Other Topics Concern   Not on file  Social History Narrative   Lives home alone.  Daughter [name] with her today.   Independent of ADLs.  Education HS.  Widowed.     Social Determinants of Health   Financial Resource Strain: Low Risk    Difficulty of Paying Living Expenses: Not hard at all  Food Insecurity: No Food Insecurity   Worried About Charity fundraiser in the Last Year: Never true   [place] in the Last Year: Never true  Transportation Needs: No Transportation Needs   Lack of Transportation (Medical): No   Lack of Transportation (Non-Medical): No  Physical Activity: Insufficiently Active   Days of Exercise per Week: 5 days   Minutes of Exercise per Session: 20 min  Stress: No Stress Concern Present   Feeling of Stress : Only  a little  Social Connections: Moderately Isolated   Frequency of Communication with Friends and Family: More than three times a week   Frequency of Social Gatherings with Friends and Family: More than three times a week   Attends Religious Services: 1 to 4 times per year   Active Member of [employer] or Organizations: No    Attends Archivist Meetings: Never   Marital Status: Widowed    Vitals:   12/31/20 1120  BP: (!) 138/92  Pulse: 65  Resp: 16  Temp: 97.8 F (36.6 C)  SpO2: 98%   Body mass index is 34.58 kg/m.  Physical Exam Vitals and nursing note reviewed.  Constitutional:      General: She is not in acute distress.    Appearance: She is well-developed.  HENT:     Head: Normocephalic and atraumatic.     Right Ear: External ear normal.     Left Ear: Tympanic membrane, ear canal and external ear normal.     Ears:     Comments: Cerumen impaction right ear.    Mouth/Throat:     Mouth: Mucous membranes are dry.     Pharynx: Oropharynx is clear.  Eyes:     Conjunctiva/sclera: Conjunctivae normal.  Cardiovascular:     Rate and Rhythm: Normal rate and regular rhythm.     Heart sounds: No murmur heard. Pulmonary:     Effort: Pulmonary effort is normal. No respiratory distress.     Breath sounds: Normal breath sounds.  Abdominal:     Palpations: Abdomen is soft.     Tenderness: There is no abdominal tenderness.  Musculoskeletal:     Right lower leg: No edema.     Left lower leg: No edema.  Skin:    General: Skin is warm.     Findings: No erythema.       Neurological:     Mental Status: She is alert and oriented to person, place, and time.     Comments: Unstable gait assisted by a walker. Fixed contractions of fingers.  Psychiatric:        Mood and Affect: Mood is anxious.     Comments: Well groomed, good eye contact.   ASSESSMENT AND PLAN:  Ms.Doninique was seen today for hospitalization follow-up.  Diagnoses and all orders for this visit:  Hearing loss of right ear due to cerumen impaction We discussed options, she would like an ear lavage done today.We discussed risks of procedure and after verbal consent my CMA performed ear lavage. She tolerated procedure well, hearing back to her baseline, satisfied with results. I did remove some cerumen with curette but  could not clear ear canal because residual cerumen is deep. I can see TM and no erythema. Continue avoiding Q tips.  Type 2 diabetes mellitus without ophthalmic manifestations ([place]) Problem has not been well controlled. No changes in Lantus or Novalog. She has an appt with her endocrinologist next week. Keep BS log.  Anxiety disorder, unspecified type It has improved some since her last visit. We discussed treatment options, CBT may help. She did not tolerate Hydroxyzine well, so do not take again. In the past she was on Xanax, we discussed some risks/side effects, she does not want more medications at this time and will let me know if problem gets worse.  Essential hypertension, benign Re-checked 140/90. Recommend trying to have caregiver checking her BP regularly. Continue low salt diet. No changes in Metoprolol succinate dose or Ramipril.  Peripheral polyneuropathy Appropriate skin  care discussed.  Skin pressure ulcers are healing well. Recommend avoiding tight braces or other devises that apply pressure or rubs skin. Monitor for signs of infection.  I spent a total of 45 minutes in both face to face and non face to face activities for this visit on the date of this encounter. During this time history was obtained and documented, examination was performed, prior labs reviewed, and assessment/plan discussed. Noted after visit that her daughter [name] sent a my chart message, we addressed most of her concerns.  Return if symptoms worsen or fail to improve.  Grizelda Piscopo G. [name], MD  [facility]. [place] office.

## 2020-12-31 NOTE — Assessment & Plan Note (Addendum)
Re-checked 140/90. Recommend trying to have caregiver checking her BP regularly. Continue low salt diet. No changes in Metoprolol succinate dose or Ramipril.

## 2021-01-04 ENCOUNTER — Ambulatory Visit (INDEPENDENT_AMBULATORY_CARE_PROVIDER_SITE_OTHER): Payer: PPO | Admitting: Internal Medicine

## 2021-01-04 ENCOUNTER — Other Ambulatory Visit: Payer: Self-pay

## 2021-01-04 ENCOUNTER — Encounter: Payer: Self-pay | Admitting: Internal Medicine

## 2021-01-04 VITALS — BP 146/90 | HR 62 | Ht 65.0 in | Wt 210.4 lb

## 2021-01-04 DIAGNOSIS — E1165 Type 2 diabetes mellitus with hyperglycemia: Secondary | ICD-10-CM

## 2021-01-04 DIAGNOSIS — Z794 Long term (current) use of insulin: Secondary | ICD-10-CM

## 2021-01-04 DIAGNOSIS — E119 Type 2 diabetes mellitus without complications: Secondary | ICD-10-CM | POA: Diagnosis not present

## 2021-01-04 NOTE — Patient Instructions (Addendum)
-  Lantus 24 units ONCE a day  -Novolog 12 units with Breakfast and 12 units with Lunch and 16 units Supper  - Novolog correctional insulin: ADD extra units on insulin to your meal-time Novolog dose if your blood sugars are higher than 165. Use the scale below to help guide you:   Blood sugar before meal Number of units to inject  Less than 155 0 unit  156 -  180 1 units  181 -  205 2 units  206 -  230 3 units  231 -  255 4 units  256 -  280 5 units  281 -  305 6 units  306 - 330 7 units  331 - 355 8 units   356 -375 9 units   376 - 400 10 units   401 - 425 11 units       HOW TO TREAT LOW BLOOD SUGARS (Blood sugar LESS THAN 70 MG/DL) Please follow the RULE OF 15 for the treatment of hypoglycemia treatment (when your (blood sugars are less than 70 mg/dL)   STEP 1: Take 15 grams of carbohydrates when your blood sugar is low, which includes:  3-4 GLUCOSE TABS  OR 3-4 OZ OF JUICE OR REGULAR SODA OR ONE TUBE OF GLUCOSE GEL    STEP 2: RECHECK blood sugar in 15 MINUTES STEP 3: If your blood sugar is still low at the 15 minute recheck --> then, go back to STEP 1 and treat AGAIN with another 15 grams of carbohydrates.

## 2021-01-04 NOTE — Progress Notes (Signed)
Name: [name]  Age/ Sex: 80 y.o., female   MRN/ DOB: [id number], 07/18/1940     PCP: [name], [name], MD   Reason for Endocrinology Evaluation: Type 2 diabetes Mellitus  Initial Endocrine Consultative Visit:  12/28/2017    PATIENT IDENTIFIER: [name] is a 80 y.o. female with a past medical history of PAT, T2 DM, and urinary incontinence, Hx of pancreatitis. The patient has followed with Endocrinology clinic since 12/28/2017 for consultative assistance with management of her diabetes.  DIABETIC HISTORY:  [name] was diagnosed with T2DM > 10 years ago.  She is intolerant to metformin due to fecal incontinence on 2000 mg a day. Her hemoglobin A1c has ranged from 6.7% in 09/01/2017, peaking at 7.9% in 2011.   SUBJECTIVE:   During the last visit (12/31/2020): Her A1c was 9.3 %, we continued lantus and Novolog doses.   Today (01/04/2021): [name] is here for a  follow up for diabetes management.    She is accompanied by Henrine Screws  ,   She checks her blood sugars multiple  times daily through the Dexcom . The patient has had hypoglycemic episodes since the last clinic visit. She is asymptomatic     She had an ED visit for labile BG's. Her BG's were noted be at 300 mg/dL during the visit. Prior to that she has hypoglycemia all day and have honey, juice and cookies and BG's were continuing with hypoglycemia    HOME DIABETES REGIMEN:  Lantus 26 units daily -  Humalog 12 units with breakfast , 14 units with lunch and 16 units with supper CF: NovoLog (BG -130/25)    CONTINUOUS GLUCOSE MONITORING RECORD INTERPRETATION    Dates of Recording: 11/9-11/22/2022  Sensor description:Dexcom G6   Results statistics:   CGM use % of time 100  Average and SD 190/55  Time in range  46%  % Time Above 180 38  % Time above 250   15  % Time Below target <1    Glycemic patterns summary: Hyperglycemia improved during the day , but hyperglycemia noted at night    Hyperglycemic episodes  postprandial mainly supper   Hypoglycemic episodes occurred N/A  Overnight periods: Stable but high    DIABETIC COMPLICATIONS: Microvascular complications:  Neuropathy  Denies: retinopathy, nephropathy  Last eye exam: Completed 06/2019   Macrovascular complications:    Denies: CAD, PVD, CVA   HISTORY:  Past Medical History:  Past Medical History:  Diagnosis Date   Anemia    during 1st pregnancy   Anxiety    Arthritis    Cancer ([place])    Basal - face   Complication of anesthesia    Depression    Diarrhea, functional    Dysrhythmia    Paroysmal Atrial Tachycardia   Essential hypertension    Fibromyalgia    GERD (gastroesophageal reflux disease)    H/O acute pancreatitis    Headache    History of hiatal hernia    Hyperlipemia    Neuropathy    Osteoporosis    Panic attacks    Paralysis ([place]) 01/06/2019   hands   PAT (paroxysmal atrial tachycardia) (HCC)    Pneumonia    PONV (postoperative nausea and vomiting)    PSVT (paroxysmal supraventricular tachycardia) (HCC)    Reflux    Restless leg syndrome    Spinal cord compression (HCC)    Type 2 diabetes mellitus (HCC)    Type II   Vertigo  not current   Past Surgical History:  Past Surgical History:  Procedure Laterality Date   ABDOMINAL HYSTERECTOMY     ANKLE SURGERY Left    x 2   ANTERIOR CERVICAL DECOMP/DISCECTOMY FUSION N/A 11/12/2017   Procedure: Cervical seven to Thoracic one  Anterior cervical decompression/discectomy/fusion with removal of old plate;  Surgeon: [name], MD;  Location: [facility];  Service: Neurosurgery;  Laterality: N/A;   BACK SURGERY     T12-L1 fusion, Anterior Cervical fusion   CARDIAC CATHETERIZATION N/A 02/16/2015   Procedure: Left Heart Cath and Coronary Angiography;  Surgeon: Sherren Mocha, MD;  Location: [facility];  Service: Cardiovascular;  Laterality: N/A;   CHOLECYSTECTOMY     COLONOSCOPY     ESOPHAGOGASTRODUODENOSCOPY  12/21/2010    Procedure: ESOPHAGOGASTRODUODENOSCOPY (EGD);  Surgeon: Landry Dyke, MD;  Location: [facility];  Service: Endoscopy;  Laterality: N/A;   EXCISION MORTON'S NEUROMA Right    EYE SURGERY Bilateral    Cataract   FRACTURE SURGERY     ankle x 2    KNEE SURGERY Right    x 3- arthroscopy   NECK SURGERY     POSTERIOR CERVICAL FUSION/FORAMINOTOMY N/A 01/22/2018   Procedure: Cervical six-seven Posterior Decompression with Posterior Fixation Cervical Five to Thoracic Two;  Surgeon: [name], MD;  Location: [facility];  Service: Neurosurgery;  Laterality: N/A;  Cervical 6-7 Posteror decompression with posterior fixation Cervical 5 to Thoracic 2   ROTATOR CUFF REPAIR Right    TONSILLECTOMY     TOTAL KNEE ARTHROPLASTY Left 04/10/2017   Procedure: LEFT TOTAL KNEE ARTHROPLASTY;  Surgeon: Paralee Cancel, MD;  Location: [facility];  Service: Orthopedics;  Laterality: Left;  70 mins   UPPER GASTROINTESTINAL ENDOSCOPY     WRIST SURGERY Left    fracture   Social History:  reports that she has quit smoking. She has been exposed to tobacco smoke. She has never used smokeless tobacco. She reports that she does not drink alcohol and does not use drugs. Family History:  Family History  Problem Relation Age of Onset   Heart failure Mother    Hyperlipidemia Mother    Lung cancer Father        Lymphoma   Hyperlipidemia Maternal Grandmother    Peripheral vascular disease Maternal Grandmother      HOME MEDICATIONS: Allergies as of 01/04/2021       Reactions   Amoxil [amoxicillin] Swelling, Other (See Comments)   Lips Has patient had a PCN reaction causing immediate rash, facial/tongue/throat swelling, SOB or lightheadedness with hypotension:YES Has patient had a PCN reaction causing severe rash involving mucus membranes or skin necrosis:No Has patient had a PCN reaction that required hospitalization:No Has patient had a PCN reaction occurring within the last 10 years:Yes. If all of the above answers are  "NO", then may proceed with Cephalosporin use.   Lisinopril Itching, Swelling   SWELLING REACTION UNSPECIFIED    Chocolate Hives   Demerol Nausea And Vomiting   Other Other (See Comments)   Raw food or nuts - GI pain        Medication List        Accurate as of January 04, 2021  4:24 PM. If you have any questions, ask your nurse or doctor.          acetaminophen 325 MG tablet Commonly known as: TYLENOL Take 325 mg by mouth every 6 (six) hours as needed.   aspirin EC 81 MG tablet Take 81 mg by mouth  at bedtime.   BD Pen Needle Micro U/F 32G X 6 MM Misc Generic drug: Insulin Pen Needle Inject 1 Device into the skin QID. USE 4 TIMES A DAY NOVOLOG(3) AND LANTUS9(1)   Calcium Carb-Cholecalciferol 600-200 MG-UNIT Tabs Take 1 tablet by mouth in the morning and at bedtime.   Debrox 6.5 % OTIC solution Generic drug: carbamide peroxide Place 5 drops into the left ear 2 (two) times daily.   Dexcom G6 Receiver Devi 1 Device by Does not apply route daily.   Dexcom G6 Sensor Misc 1 kit by Does not apply route as directed.   Dexcom G6 Transmitter Misc 1 kit by Does not apply route as directed.   digoxin 0.125 MG tablet Commonly known as: LANOXIN Take 1 tablet (0.125 mg total) by mouth daily.   docusate sodium 100 MG capsule Commonly known as: COLACE Take 100 mg by mouth daily. twice a day   DULoxetine 30 MG capsule Commonly known as: CYMBALTA TAKE 2 CAPSULES BY MOUTH EVERY DAY   ezetimibe 10 MG tablet Commonly known as: ZETIA TAKE 1 TABLET BY MOUTH EVERY DAY   hydrOXYzine 25 MG tablet Commonly known as: ATARAX/VISTARIL Take 0.5-1 tablets (12.5-25 mg total) by mouth every 12 (twelve) hours as needed for anxiety.   Lantus SoloStar 100 UNIT/ML Solostar Pen Generic drug: insulin glargine Inject 26 Units into the skin daily.   meloxicam 15 MG tablet Commonly known as: MOBIC TAKE 1 TABLET (15 MG TOTAL) BY MOUTH DAILY.   metoprolol succinate 25 MG 24 hr  tablet Commonly known as: TOPROL-XL TAKE 1 TABLET BY MOUTH DAILY. PLEASE MAKE OVERDUE APPT WITH DR. Marlou Porch BEFORE ANYMORE REFILLS.   Myrbetriq 50 MG Tb24 tablet Generic drug: mirabegron ER TAKE 1 TABLET BY MOUTH EVERY DAY   NovoLOG FlexPen 100 UNIT/ML FlexPen Generic drug: insulin aspart Max daily 75 units per correction scale   omega-3 acid ethyl esters 1 g capsule Commonly known as: LOVAZA Take 1 capsule (1 g total) by mouth 2 (two) times daily.   omeprazole 20 MG capsule Commonly known as: PRILOSEC Take 1 capsule (20 mg total) by mouth daily.   Pfizer-BioNT COVID-19 Vac-TriS Susp injection Generic drug: COVID-19 mRNA Vac-TriS (Pfizer)   pregabalin 75 MG capsule Commonly known as: Lyrica Take 1 capsule (75 mg total) by mouth 3 (three) times daily.   PRESERVISION AREDS 2 PO Take 1 tablet by mouth 2 (two) times daily.   ramipril 10 MG capsule Commonly known as: ALTACE TAKE 1 CAPSULE (10 MG TOTAL) BY MOUTH EVERY EVENING.   RELION INSULIN SYRINGE 1ML/31G 31G X 5/16" 1 ML Misc Generic drug: Insulin Syringe-Needle U-100 Use as directed   rOPINIRole 1 MG tablet Commonly known as: REQUIP TAKE 1 TABLET BY MOUTH EVERYDAY AT BEDTIME   rosuvastatin 10 MG tablet Commonly known as: CRESTOR TAKE 1 TABLET BY MOUTH EVERY DAY   Shingrix injection Generic drug: Zoster Vaccine Adjuvanted   verapamil 240 MG 24 hr capsule Commonly known as: VERELAN PM TAKE 1 CAPSULE BY MOUTH EVERY DAY   verapamil 120 MG tablet Commonly known as: CALAN Take 1 tablet (120 mg total) by mouth daily as needed (afib).   VITAMIN B 12 PO Take 1,000 mg by mouth daily.   VITAMIN D PO Take 10,000 Units by mouth daily. 10,000 in the am and 5,000 in the pm         OBJECTIVE:   Vital Signs: BP (!) 146/90 (BP Location: Left Arm, Patient Position: Sitting, Cuff Size: Normal)  Pulse 62   Ht 5' 5"  (1.651 m)   Wt 210 lb 6.4 oz (95.4 kg)   SpO2 98%   BMI 35.01 kg/m   Wt Readings from Last 3  Encounters:  01/04/21 210 lb 6.4 oz (95.4 kg)  12/31/20 207 lb 12.8 oz (94.3 kg)  11/30/20 206 lb (93.4 kg)     Exam: General: Pt appears well and is in NAD  Lungs: Clear with good BS bilat   Heart: RRR   Extremities: No pretibial edema.  Neuro: MS is good with appropriate affect, pt is alert and Ox3    DM foot exam:03/09/2020 The skin of the feet is intact without sores or ulcerations. The pedal pulses are 2+ on right and 2+ on left. The sensation is decreased to a screening 5.07, 10 gram monofilament bilaterally     DATA REVIEWED:  Lab Results  Component Value Date   HGBA1C 9.3 (A) 12/02/2020   HGBA1C 7.2 (A) 03/09/2020   HGBA1C 7.7 (A) 09/03/2019      ASSESSMENT / PLAN / RECOMMENDATIONS:   1) Type 2 Diabetes Mellitus, Poorly Controlled, With  neuropathic complications - Most recent A1c of 9.3  %. Goal A1c < 7.0 %.     - I am VERY concerned that [name] has been mixing her insulin and using  them incorrectly . The pt tells me she has been using the "Orange " pen 24 units in the morning ( which is Novolog ) and the [employer] ( which is lantus ) with meals, that could explain the labile BG's. The care giver today assures me that this is not the case, but the pt is the one responsible for administering her insulin and the current care giver is not there with her 24/7 . So I am concerned  - I have asked the pt  and caregiver to go home and label and or separate insulin in different locations so we are not mixing them  -Her  main barriers to diabetes self-care is depression and today I have asked her to seek a psychiatrist as well as a therapist help  -- intolerant to metformin, has questionable hx of pancreatitis hence reluctant to start GLP-1 agonists. Not a candidate for SGLT- 2 inhibitors due to recurrent UTI's -We will make the following adjustments  MEDICATIONS: Lantus 24 units once a day NovoLog  12 units with Breakfast and 12 units with Lunch and 16 units with   Supper  CF: (BG- 130/25)    EDUCATION / INSTRUCTIONS: BG monitoring instructions: Patient is instructed to check her blood sugars 4 times a day, before meals and bedtime. Call [facility] if: BG persistently < 70 I reviewed the Rule of 15 for the treatment of hypoglycemia in detail with the patient. Literature supplied.  2) Diabetic complications:  Eye: Does not have known diabetic retinopathy.  Neuro/ Feet: Does have known diabetic peripheral neuropathy .  Renal: Patient does not have known baseline CKD. She   is on an ACEI/ARB at present.        F/U in 4 months   I spent 25 minutes preparing to see the patient by review of recent labs, imaging and procedures, obtaining and reviewing separately obtained history, communicating with the patient/family or caregiver, ordering medications, tests or procedures, and documenting clinical information in the EHR including the differential Dx, treatment, and any further evaluation and other management   signed electronically by: Mack Guise, MD  [facility] [address], [place], [place] [address] Phone: [phone] FAX: [phone]   CC: [name], [name], [place] [address] Phone: [phone]  Fax: [phone]  Return to Endocrinology clinic as below: Future Appointments  Date Time Provider [place]  01/05/2021  9:00 AM [facility] BF-CCM SOCIAL WRK [facility]  02/23/2021  1:45 PM [facility] CCM PHARMACIST [facility]  04/05/2021 10:00 AM Raulkar, Clide Deutscher, MD [facility]-PRMA CPR  04/05/2021  1:40 PM Zaylon Bossier, Melanie Crazier, MD [facility]  11/29/2021 10:15 AM [facility]-NURSE HEALTH ADVISOR 2 [facility] P[facility]

## 2021-01-05 ENCOUNTER — Ambulatory Visit: Payer: PPO | Admitting: *Deleted

## 2021-01-05 DIAGNOSIS — F419 Anxiety disorder, unspecified: Secondary | ICD-10-CM

## 2021-01-05 DIAGNOSIS — I10 Essential (primary) hypertension: Secondary | ICD-10-CM

## 2021-01-05 DIAGNOSIS — M4712 Other spondylosis with myelopathy, cervical region: Secondary | ICD-10-CM

## 2021-01-05 DIAGNOSIS — E1165 Type 2 diabetes mellitus with hyperglycemia: Secondary | ICD-10-CM

## 2021-01-05 DIAGNOSIS — G629 Polyneuropathy, unspecified: Secondary | ICD-10-CM

## 2021-01-05 DIAGNOSIS — M159 Polyosteoarthritis, unspecified: Secondary | ICD-10-CM

## 2021-01-05 DIAGNOSIS — R2681 Unsteadiness on feet: Secondary | ICD-10-CM

## 2021-01-05 DIAGNOSIS — M797 Fibromyalgia: Secondary | ICD-10-CM

## 2021-01-05 DIAGNOSIS — R531 Weakness: Secondary | ICD-10-CM

## 2021-01-05 NOTE — Chronic Care Management (AMB) (Signed)
Chronic Care Management    Clinical Social Work Note  01/05/2021 Name: [name] MRN: [id number] DOB: 30-Jan-1941  ABEGAIL KLOEPPEL is a 80 y.o. year old female who is a primary care patient of [name], Malka So, MD. The CCM team was consulted to assist the patient with chronic disease management and/or care coordination needs related to: [employer], Level of Care Concerns, and Caregiver Stress.   Engaged with patient by telephone for follow up visit in response to provider referral for social work chronic care management and care coordination services.   Consent to Services:  The patient was given information about Chronic Care Management services, agreed to services, and gave verbal consent prior to initiation of services.  Please see initial visit note for detailed documentation.   Patient agreed to services and consent obtained.   Assessment: Review of patient past medical history, allergies, medications, and health status, including review of relevant consultants reports was performed today as part of a comprehensive evaluation and provision of chronic care management and care coordination services.     SDOH (Social Determinants of Health) assessments and interventions performed:    Advanced Directives Status: Not addressed in this encounter.  CCM Care Plan  Allergies  Allergen Reactions   Amoxil [Amoxicillin] Swelling and Other (See Comments)    Lips Has patient had a PCN reaction causing immediate rash, facial/tongue/throat swelling, SOB or lightheadedness with hypotension:YES Has patient had a PCN reaction causing severe rash involving mucus membranes or skin necrosis:No Has patient had a PCN reaction that required hospitalization:No Has patient had a PCN reaction occurring within the last 10 years:Yes. If all of the above answers are "NO", then may proceed with Cephalosporin use.    Lisinopril Itching and Swelling    SWELLING REACTION UNSPECIFIED    Chocolate  Hives   Demerol Nausea And Vomiting   Other Other (See Comments)    Raw food or nuts - GI pain    Outpatient Encounter Medications as of 01/05/2021  Medication Sig Note   acetaminophen (TYLENOL) 325 MG tablet Take 325 mg by mouth every 6 (six) hours as needed.    aspirin EC 81 MG tablet Take 81 mg by mouth at bedtime.     Calcium Carb-Cholecalciferol 600-200 MG-UNIT TABS Take 1 tablet by mouth in the morning and at bedtime.     carbamide peroxide (DEBROX) 6.5 % OTIC solution Place 5 drops into the left ear 2 (two) times daily. 11/11/2019: As needed   Continuous Blood Gluc Receiver (DEXCOM G6 RECEIVER) DEVI 1 Device by Does not apply route daily.    Continuous Blood Gluc Sensor (DEXCOM G6 SENSOR) MISC 1 kit by Does not apply route as directed.    Continuous Blood Gluc Transmit (DEXCOM G6 TRANSMITTER) MISC 1 kit by Does not apply route as directed.    Cyanocobalamin (VITAMIN B 12 PO) Take 1,000 mg by mouth daily.    digoxin (LANOXIN) 0.125 MG tablet Take 1 tablet (0.125 mg total) by mouth daily.    docusate sodium (COLACE) 100 MG capsule Take 100 mg by mouth daily. twice a day    DULoxetine (CYMBALTA) 30 MG capsule TAKE 2 CAPSULES BY MOUTH EVERY DAY    ezetimibe (ZETIA) 10 MG tablet TAKE 1 TABLET BY MOUTH EVERY DAY    hydrOXYzine (ATARAX/VISTARIL) 25 MG tablet Take 0.5-1 tablets (12.5-25 mg total) by mouth every 12 (twelve) hours as needed for anxiety.    insulin aspart (NOVOLOG FLEXPEN) 100 UNIT/ML FlexPen Max daily 75 units  per correction scale    insulin glargine (LANTUS SOLOSTAR) 100 UNIT/ML Solostar Pen Inject 26 Units into the skin daily.    Insulin Pen Needle (BD PEN NEEDLE MICRO U/F) 32G X 6 MM MISC Inject 1 Device into the skin QID. USE 4 TIMES A DAY NOVOLOG(3) AND LANTUS9(1)    meloxicam (MOBIC) 15 MG tablet TAKE 1 TABLET (15 MG TOTAL) BY MOUTH DAILY.    metoprolol succinate (TOPROL-XL) 25 MG 24 hr tablet TAKE 1 TABLET BY MOUTH DAILY. PLEASE MAKE OVERDUE APPT WITH DR. Marlou Porch BEFORE  ANYMORE REFILLS.    Multiple Vitamins-Minerals (PRESERVISION AREDS 2 PO) Take 1 tablet by mouth 2 (two) times daily.     MYRBETRIQ 50 MG TB24 tablet TAKE 1 TABLET BY MOUTH EVERY DAY    omega-3 acid ethyl esters (LOVAZA) 1 g capsule Take 1 capsule (1 g total) by mouth 2 (two) times daily.    omeprazole (PRILOSEC) 20 MG capsule Take 1 capsule (20 mg total) by mouth daily.    PFIZER-BIONT COVID-19 VAC-TRIS SUSP injection     pregabalin (LYRICA) 75 MG capsule Take 1 capsule (75 mg total) by mouth 3 (three) times daily.    ramipril (ALTACE) 10 MG capsule TAKE 1 CAPSULE (10 MG TOTAL) BY MOUTH EVERY EVENING.    RELION INSULIN SYRINGE 1ML/31G 31G X 5/16" 1 ML MISC Use as directed    rOPINIRole (REQUIP) 1 MG tablet TAKE 1 TABLET BY MOUTH EVERYDAY AT BEDTIME    rosuvastatin (CRESTOR) 10 MG tablet TAKE 1 TABLET BY MOUTH EVERY DAY    SHINGRIX injection     verapamil (CALAN) 120 MG tablet Take 1 tablet (120 mg total) by mouth daily as needed (afib).    verapamil (VERELAN PM) 240 MG 24 hr capsule TAKE 1 CAPSULE BY MOUTH EVERY DAY    VITAMIN D PO Take 10,000 Units by mouth daily. 10,000 in the am and 5,000 in the pm    No facility-administered encounter medications on file as of 01/05/2021.    Patient Active Problem List   Diagnosis Date Noted   Type 2 diabetes mellitus with hyperglycemia, with long-term current use of insulin ([place]) 12/02/2020   Coronary artery calcification 11/23/2020   Anxiety disorder 11/19/2020   Aortic atherosclerosis ([place]) 09/20/2019   Hyperlipidemia associated with type 2 diabetes mellitus ([place]) 09/17/2019   Mild recurrent major depression ([place]) 09/17/2019   Type 2 diabetes mellitus with diabetic polyneuropathy, with long-term current use of insulin ([place]) 09/04/2019   Fatigue 09/03/2019   Dysuria 09/03/2019   Peripheral neuropathy 02/28/2019   Thoracic myelopathy 01/08/2019   Cervical spine fracture ([place]) 01/22/2018   Weakness 01/21/2018   Closed C7 fracture ([place])  01/21/2018   Fall    Lower urinary tract infectious disease    Cervical spondylosis with myelopathy 11/12/2017   Tinnitus of both ears 10/19/2017   Rhinorrhea 09/26/2017   Chronic diarrhea 08/08/2017   S/P left TKA 04/10/2017   Unstable gait 01/12/2017   Generalized osteoarthritis of hand 01/12/2017   RLS (restless legs syndrome) 11/09/2016   GERD (gastroesophageal reflux disease) 07/13/2016   Morbid obesity ([place]) 06/01/2016   Vitamin D deficiency 06/01/2016   Vitamin B12 deficiency 03/27/2016   Chronic cough 03/16/2016   Left foot pain 03/16/2016   Degenerative drusen 07/13/2013   Type 2 diabetes mellitus without ophthalmic manifestations ([place]) 07/13/2013   DM (diabetes mellitus) type II controlled, neurological manifestation ([place]) 05/07/2012   SVT (supraventricular tachycardia) ([place]) 05/07/2012   Essential hypertension, benign 05/07/2012   Fibromyalgia  05/07/2012    Conditions to be addressed/monitored: HTN, HLD, and Anxiety.  Limited Social Support, Level of Care Concerns, ADL/IADL Limitations, Social Isolation, Memory Deficits, and Cognitive Deficits.  Care Plan : LCSW Plan of Care  Updates made by Francis Gaines, LCSW since 01/05/2021 12:00 AM     Problem: Improve My Quality of Life through [facility].   Priority: High     Long-Range Goal: Improve My Quality of Life through [facility].   Start Date: 12/06/2020  Expected End Date: 03/08/2021  This Visit's Progress: On track  Recent Progress: On track  Priority: High  Note:   Current Barriers:  Chronic disease management support, education, resource and referral needs related to Mobility Issues, Fall Risk, Weakness, Fatigue, Morbid Obesity and Chronic Fibromyalgia Pain. Limited ability to perform activities of daily living independently and inability to self-administer medications as prescribed.   Clinical Goal(s):  Patient  will work with LCSW to identify and address any acute and/or chronic care coordination needs, as it relates to self-health management.   Patient and daughters desire for patient to receive placement into a long-term memory care assisted living facility.  LCSW will submit appropriate paperwork to [facility] to try and obtain prior approval and authorization for short-term skilled nursing placement. LCSW Interventions:  Inter-disciplinary care team collaboration (see longitudinal plan of care). Collaboration with Primary Care Physician, Dr. Betty [name] regarding development and update of comprehensive plan of care as evidenced by provider attestation and co-signature. Patient Goals/Self-Care Activities: Continue to receive 24 hour care and supervision in the home, through a Location manager.   Continue to receive tri-weekly calls from LCSW, in an effort to coordinate long-term memory care assisted living facility placement.   Congratulations on receiving a bed offer from [facility], facility of choice, and remain on waiting list for bed availability.    LCSW Collaboration with admissions coordinator at [facility] to confirm that you are # 3 on the waiting list. Contact LCSW directly (# [phone]) if you have questions, need assistance, or if additional social work needs are identified.   Follow-Up Plan:  LCSW will make contact with patient on 01/26/2021 at 10:45 am      [place] Social Worker [place] [phone]

## 2021-01-05 NOTE — Patient Instructions (Signed)
Visit Information  Thank you for taking time to visit with me today. Please don't hesitate to contact me if I can be of assistance to you before our next scheduled telephone appointment.  Following are the goals we discussed today:   Patient Goals/Self-Care Activities: Continue to receive 24 hour care and supervision in the home, through a private agency sitter.   Continue to receive tri-weekly calls from LCSW, in an effort to coordinate long-term memory care assisted living facility placement.   Congratulations on receiving a bed offer from [facility], facility of choice, and remain on waiting list for bed availability.    LCSW Collaboration with admissions coordinator at [facility] to confirm that you are # 3 on the waiting list. Contact LCSW directly (# [phone]) if you have questions, need assistance, or if additional social work needs are identified.    Our next appointment is by telephone on 01/26/2021 at 10:45 am  Please call the care guide team at [phone] if you need to cancel or reschedule your appointment.   Please call the Suicide and Crisis Lifeline: 988 call the [place] National Suicide Prevention Lifeline: 863-532-0825 or TTY: (334) 099-9867 TTY 365-410-4334) to talk to a trained counselor call 1-800-273-TALK (toll free, 24 hour hotline) go to [facility] [address] [place] [phone]) call 911 if you are experiencing a Mental Health or [place] or need someone to talk to.  Patient verbalizes understanding of instructions provided today and agrees to view in [place].   Nat Christen LCSW Licensed Clinical Social Worker [place] [phone]

## 2021-01-10 ENCOUNTER — Telehealth: Payer: Self-pay | Admitting: Family Medicine

## 2021-01-10 NOTE — Telephone Encounter (Signed)
Patient called saying that she was told she should see a psychiatrist. Patient wants to know who Dr. [name] suggest she sees.   Patient can be contacted at [phone].  Please advise.

## 2021-01-11 NOTE — Telephone Encounter (Signed)
I called and left patient a voicemail letting her know that I am mailing her a list of psychiatric offices and also sending her a brochure. I advised her to call back with any questions.

## 2021-01-11 NOTE — Telephone Encounter (Signed)
We can provide a list of providers in the area as well as a flyer with [place] contact information.  Thanks, BJ

## 2021-01-12 DIAGNOSIS — E1165 Type 2 diabetes mellitus with hyperglycemia: Secondary | ICD-10-CM

## 2021-01-12 DIAGNOSIS — E1142 Type 2 diabetes mellitus with diabetic polyneuropathy: Secondary | ICD-10-CM

## 2021-01-12 DIAGNOSIS — M159 Polyosteoarthritis, unspecified: Secondary | ICD-10-CM

## 2021-01-12 DIAGNOSIS — E1140 Type 2 diabetes mellitus with diabetic neuropathy, unspecified: Secondary | ICD-10-CM

## 2021-01-12 DIAGNOSIS — M4712 Other spondylosis with myelopathy, cervical region: Secondary | ICD-10-CM

## 2021-01-12 DIAGNOSIS — I10 Essential (primary) hypertension: Secondary | ICD-10-CM

## 2021-01-12 DIAGNOSIS — F339 Major depressive disorder, recurrent, unspecified: Secondary | ICD-10-CM

## 2021-01-12 DIAGNOSIS — Z794 Long term (current) use of insulin: Secondary | ICD-10-CM

## 2021-01-15 ENCOUNTER — Other Ambulatory Visit: Payer: Self-pay | Admitting: Family Medicine

## 2021-01-16 ENCOUNTER — Other Ambulatory Visit: Payer: Self-pay | Admitting: Family Medicine

## 2021-01-16 DIAGNOSIS — I10 Essential (primary) hypertension: Secondary | ICD-10-CM

## 2021-01-19 ENCOUNTER — Telehealth: Payer: Self-pay | Admitting: *Deleted

## 2021-01-19 DIAGNOSIS — M25561 Pain in right knee: Secondary | ICD-10-CM | POA: Diagnosis not present

## 2021-01-19 NOTE — Telephone Encounter (Signed)
   [facility] Pre-operative Risk Assessment    Patient Name: [name]  DOB: [birth year] MRN: [id number]  HEARTCARE STAFF:  - IMPORTANT!!!!!! Under Visit Info/Reason for Call, type in Other and utilize the format Clearance MM/DD/YY or Clearance TBD. Do not use dashes or single digits. - Please review there is not already an duplicate clearance open for this procedure. - If request is for dental extraction, please clarify the # of teeth to be extracted. - If the patient is currently at the dentist's office, call Pre-Op Callback Staff (MA/nurse) to input urgent request.  - If the patient is not currently in the dentist office, please route to the Pre-Op pool.  Request for surgical clearance:  What type of surgery is being performed?  RIGHT TOTAL KNEE REPLACEMENT  When is this surgery scheduled?  TBD  What type of clearance is required (medical clearance vs. Pharmacy clearance to hold med vs. Both)?  BOTH  Are there any medications that need to be held prior to surgery and how long?  ASPIRIN  Practice name and name of physician performing surgery?  [facility] / DR. LANDAU  What is the office phone number?  [phone]   7.   What is the office fax number?  [phone]  8.   Anesthesia type (None, local, MAC, general) ?     Jeanann Lewandowsky 01/19/2021, 2:34 PM  _________________________________________________________________   (provider comments below)

## 2021-01-20 NOTE — Telephone Encounter (Addendum)
   Name: [name]  DOB: October 26, 1940  MRN: [id number]   Primary Cardiologist: Candee Furbish, MD  Chart reviewed as part of pre-operative protocol coverage. Patient was contacted 01/20/2021 in reference to pre-operative risk assessment for pending surgery as outlined below.  [name] was last seen on 11/2020, history reviewed, primarily followed for history of SVT. Prior normal coronaries 2017. Echo 2019 with normal EF. RCRI 0.9% (only RF + IDDM) indicating low CV risk overall. I reached out to patient for update on how she is doing. The patient affirms she has been doing well from heart standpoint without any new cardiac symptoms including with exertion. (Her main issue is knee pain for which she wants to proceed with knee surgery ASAP.) Therefore, based on ACC/AHA guidelines, the patient would be at acceptable risk for the planned procedure without further cardiovascular testing. The patient was advised that if she develops new symptoms prior to surgery to contact our office to arrange for a follow-up visit, and she verbalized understanding.  From cardiac standpoint, OK to hold ASA 5-7 days prior to surgery if needed. No hx of PCI/stents/bypass.  I will route this recommendation to the requesting party via Epic fax function and remove from pre-op pool. Please call with questions.  Charlie Pitter, PA-C 01/20/2021, 12:54 PM

## 2021-01-21 ENCOUNTER — Other Ambulatory Visit: Payer: Self-pay | Admitting: Physical Medicine and Rehabilitation

## 2021-01-25 NOTE — Progress Notes (Addendum)
°Chief Complaint  °Patient presents with  ° surgery clearance  °HPI: °[name] is a 80 y.o. female with hx of DM II,depression,anxiety,fibromyalgia,SVT,HTN, peripheral neuropathy,and generalized OA here today for surgery clearance requested by Dr Landau. °She is planning on having right total knee replacement. °Date of surgery has not been determine. °She does not know if she is going to stay in the hospital overnight. ° °Knee feels unstable when she gets up from chair and when walking. °Problem is getting worse. ° °She has had other orthopedic surgeries, no complications during or after procedures. °S/P left TKR. ° °Lab Results  °Component Value Date  ° CREATININE 1.11 (H) 12/24/2020  ° BUN 26 (H) 12/24/2020  ° NA 136 12/24/2020  ° K 4.3 12/24/2020  ° CL 103 12/24/2020  ° CO2 23 12/24/2020  ° °DM II, follows with endocrinologist. °She is on Lantus 26 U and Novolog max 75 U per sliding scale. °She is trying to eat healthier and BS's have been better, yesterday 118. ° °Lab Results  °Component Value Date  ° HGBA1C 9.3 (A) 12/02/2020  ° °HTN and SVT: She is on Verapamil 240 mg daily and 120 mg daily prn, Metoprolol succinate 25 mg daily. °She is also on Ramipril 10 mg daily. °She has limited mobility, so cannot engage in moderate or intense physical activity. Negative for chest pain, dyspnea, palpitation, diaphoresis with mild exertion. ° °HLD on Rosuvastatin 10 mg daily and Zetia 10 mg daily. °She is established with cardiologist, Dr Skains, last follow up 11/23/20. ° °Constipation, having a bowel movement once per week. She takes Miralax daily and other stool softeners. °Abdominal pain alleviated by defecation. °Negative for fever,changes in appetite,unusual fatigue,blood in stool, or melena. ° °Fibromyalgia and peripheral neuropathy on Duloxetine 60 mg daily and Lyrica 75 mg tid. °She is also on Meloxicam 15 mg daily prn. ° °Anxiety and depression: On Duloxetine 60 mg daily. Relation with her daughter has  improved, she acknowledges her daughter Valery is "a good person" and values her help. °Mood has improved. ° °Review of Systems  °Constitutional:  Negative for activity change, appetite change and chills.  °HENT:  Negative for mouth sores, nosebleeds and sore throat.   °Respiratory:  Negative for cough and wheezing.   °Genitourinary:  Negative for decreased urine volume, dysuria and hematuria.  °Musculoskeletal:  Positive for arthralgias, back pain and gait problem.  °Skin:  Negative for rash.  °Neurological:  Negative for headaches.  °Psychiatric/Behavioral:  Negative for confusion. The patient is nervous/anxious.   °Rest see pertinent positives and negatives per HPI. ° °Current Outpatient Medications on File Prior to Visit  °Medication Sig Dispense Refill  ° acetaminophen (TYLENOL) 325 MG tablet Take 325 mg by mouth every 6 (six) hours as needed.    ° aspirin EC 81 MG tablet Take 81 mg by mouth at bedtime.     ° Calcium Carb-Cholecalciferol 600-200 MG-UNIT TABS Take 1 tablet by mouth in the morning and at bedtime.     ° carbamide peroxide (DEBROX) 6.5 % OTIC solution Place 5 drops into the left ear 2 (two) times daily. 15 mL 0  ° Continuous Blood Gluc Receiver (DEXCOM G6 RECEIVER) DEVI 1 Device by Does not apply route daily. 1 Device 0  ° Continuous Blood Gluc Sensor (DEXCOM G6 SENSOR) MISC 1 kit by Does not apply route as directed. 3 each 3  ° Continuous Blood Gluc Transmit (DEXCOM G6 TRANSMITTER) MISC 1 kit by Does not apply route as directed. 1 each   each 3   Cyanocobalamin (VITAMIN B 12 PO) Take 1,000 mg by mouth daily.     digoxin (LANOXIN) 0.125 MG tablet Take 1 tablet (0.125 mg total) by mouth daily. 90 tablet 0   docusate sodium (COLACE) 100 MG capsule Take 100 mg by mouth daily. twice a day     DULoxetine (CYMBALTA) 30 MG capsule TAKE 2 CAPSULES BY MOUTH EVERY DAY 180 capsule 2   ezetimibe (ZETIA) 10 MG tablet TAKE 1 TABLET BY MOUTH EVERY DAY 90 tablet 2   hydrOXYzine (ATARAX/VISTARIL) 25 MG tablet Take  0.5-1 tablets (12.5-25 mg total) by mouth every 12 (twelve) hours as needed for anxiety. 30 tablet 1   insulin aspart (NOVOLOG FLEXPEN) 100 UNIT/ML FlexPen Max daily 75 units per correction scale 75 mL 3   insulin glargine (LANTUS SOLOSTAR) 100 UNIT/ML Solostar Pen Inject 26 Units into the skin daily. 30 mL 3   Insulin Pen Needle (BD PEN NEEDLE MICRO U/F) 32G X 6 MM MISC Inject 1 Device into the skin QID. USE 4 TIMES A DAY NOVOLOG(3) AND LANTUS9(1) 400 each 3   meloxicam (MOBIC) 15 MG tablet TAKE 1 TABLET (15 MG TOTAL) BY MOUTH DAILY. 30 tablet 1   metoprolol succinate (TOPROL-XL) 25 MG 24 hr tablet TAKE 1 TABLET BY MOUTH DAILY. PLEASE MAKE OVERDUE APPT WITH DR. Marlou Porch BEFORE ANYMORE REFILLS. 30 tablet 12   Multiple Vitamins-Minerals (PRESERVISION AREDS 2 PO) Take 1 tablet by mouth 2 (two) times daily.      MYRBETRIQ 50 MG TB24 tablet TAKE 1 TABLET BY MOUTH EVERY DAY 90 tablet 1   omega-3 acid ethyl esters (LOVAZA) 1 g capsule Take 1 capsule (1 g total) by mouth 2 (two) times daily. 180 capsule 2   omeprazole (PRILOSEC) 20 MG capsule Take 1 capsule (20 mg total) by mouth daily. 90 capsule 2   PFIZER-BIONT COVID-19 VAC-TRIS SUSP injection      pregabalin (LYRICA) 75 MG capsule TAKE 1 CAPSULE BY MOUTH 3 TIMES DAILY. 90 capsule 2   ramipril (ALTACE) 10 MG capsule TAKE 1 CAPSULE BY MOUTH EVERY EVENING. 90 capsule 2   RELION INSULIN SYRINGE 1ML/31G 31G X 5/16" 1 ML MISC Use as directed 100 each 1   rOPINIRole (REQUIP) 1 MG tablet TAKE 1 TABLET BY MOUTH EVERYDAY AT BEDTIME 90 tablet 1   rosuvastatin (CRESTOR) 10 MG tablet TAKE 1 TABLET BY MOUTH EVERY DAY 90 tablet 1   SHINGRIX injection      verapamil (CALAN) 120 MG tablet Take 1 tablet (120 mg total) by mouth daily as needed (afib). 30 tablet 1   verapamil (VERELAN PM) 240 MG 24 hr capsule TAKE 1 CAPSULE BY MOUTH EVERY DAY 90 capsule 3   VITAMIN D PO Take 10,000 Units by mouth daily. 10,000 in the am and 5,000 in the pm     No current  facility-administered medications on file prior to visit.   Past Medical History:  Diagnosis Date   Anemia    during 1st pregnancy   Anxiety    Arthritis    Cancer ([place])    Basal - face   Complication of anesthesia    Depression    Diarrhea, functional    Dysrhythmia    Paroysmal Atrial Tachycardia   Essential hypertension    Fibromyalgia    GERD (gastroesophageal reflux disease)    H/O acute pancreatitis    Headache    History of hiatal hernia    Hyperlipemia    Neuropathy  Osteoporosis    Panic attacks    Paralysis (HCC) 01/06/2019   hands   PAT (paroxysmal atrial tachycardia) (HCC)    Pneumonia    PONV (postoperative nausea and vomiting)    PSVT (paroxysmal supraventricular tachycardia) (HCC)    Reflux    Restless leg syndrome    Spinal cord compression (HCC)    Type 2 diabetes mellitus (HCC)    Type II   Vertigo    not current   Past Surgical History:  Procedure Laterality Date   ABDOMINAL HYSTERECTOMY     ANKLE SURGERY Left    x 2   ANTERIOR CERVICAL DECOMP/DISCECTOMY FUSION N/A 11/12/2017   Procedure: Cervical seven to Thoracic one  Anterior cervical decompression/discectomy/fusion with removal of old plate;  Surgeon: [name], MD;  Location: [facility];  Service: Neurosurgery;  Laterality: N/A;   BACK SURGERY     T12-L1 fusion, Anterior Cervical fusion   CARDIAC CATHETERIZATION N/A 02/16/2015   Procedure: Left Heart Cath and Coronary Angiography;  Surgeon: Sherren Mocha, MD;  Location: [facility];  Service: Cardiovascular;  Laterality: N/A;   CHOLECYSTECTOMY     COLONOSCOPY     ESOPHAGOGASTRODUODENOSCOPY  12/21/2010   Procedure: ESOPHAGOGASTRODUODENOSCOPY (EGD);  Surgeon: Landry Dyke, MD;  Location: [facility];  Service: Endoscopy;  Laterality: N/A;   EXCISION MORTON'S NEUROMA Right    EYE SURGERY Bilateral    Cataract   FRACTURE SURGERY     ankle x 2    KNEE SURGERY Right    x 3- arthroscopy   NECK SURGERY     POSTERIOR CERVICAL  FUSION/FORAMINOTOMY N/A 01/22/2018   Procedure: Cervical six-seven Posterior Decompression with Posterior Fixation Cervical Five to Thoracic Two;  Surgeon: [name], MD;  Location: [facility];  Service: Neurosurgery;  Laterality: N/A;  Cervical 6-7 Posteror decompression with posterior fixation Cervical 5 to Thoracic 2   ROTATOR CUFF REPAIR Right    TONSILLECTOMY     TOTAL KNEE ARTHROPLASTY Left 04/10/2017   Procedure: LEFT TOTAL KNEE ARTHROPLASTY;  Surgeon: Paralee Cancel, MD;  Location: [facility];  Service: Orthopedics;  Laterality: Left;  70 mins   UPPER GASTROINTESTINAL ENDOSCOPY     WRIST SURGERY Left    fracture    Allergies  Allergen Reactions   Amoxil [Amoxicillin] Swelling and Other (See Comments)    Lips Has patient had a PCN reaction causing immediate rash, facial/tongue/throat swelling, SOB or lightheadedness with hypotension:YES Has patient had a PCN reaction causing severe rash involving mucus membranes or skin necrosis:No Has patient had a PCN reaction that required hospitalization:No Has patient had a PCN reaction occurring within the last 10 years:Yes. If all of the above answers are "NO", then may proceed with Cephalosporin use.    Lisinopril Itching and Swelling    SWELLING REACTION UNSPECIFIED    Chocolate Hives   Demerol Nausea And Vomiting   Other Other (See Comments)    Raw food or nuts - GI pain    Social History   Socioeconomic History   Marital status: Widowed    Spouse name: Noelly Lasseigne   Number of children: 2   Years of education: 12   Highest education level: 12th grade  Occupational History   Occupation: Retired  Tobacco Use   Smoking status: Former    Passive exposure: Past   Smokeless tobacco: Never   Tobacco comments:    smoke 1 cigarette every few weeks when the [place] lady came by.  Vaping Use   Vaping Use:  Never used  Substance and Sexual Activity   Alcohol use: No   Drug use: No   Sexual activity: Not Currently  Other Topics Concern    Not on file  Social History Narrative   Lives home alone.  Daughter [name] with her today.   Independent of ADLs.  Education HS.  Widowed.     Social Determinants of Health   Financial Resource Strain: Low Risk    Difficulty of Paying Living Expenses: Not hard at all  Food Insecurity: No Food Insecurity   Worried About Charity fundraiser in the Last Year: Never true   [place] in the Last Year: Never true  Transportation Needs: No Transportation Needs   Lack of Transportation (Medical): No   Lack of Transportation (Non-Medical): No  Physical Activity: Insufficiently Active   Days of Exercise per Week: 5 days   Minutes of Exercise per Session: 20 min  Stress: No Stress Concern Present   Feeling of Stress : Only a little  Social Connections: Moderately Isolated   Frequency of Communication with Friends and Family: More than three times a week   Frequency of Social Gatherings with Friends and Family: More than three times a week   Attends Religious Services: 1 to 4 times per year   Active Member of [employer] or Organizations: No   Attends Archivist Meetings: Never   Marital Status: Widowed   Vitals:   01/26/21 0718  BP: 124/80  Pulse: 66  Resp: 16  SpO2: 97%   Body mass index is 33.68 kg/m.  Physical Exam Vitals and nursing note reviewed.  Constitutional:      General: She is not in acute distress.    Appearance: She is well-developed.  HENT:     Head: Normocephalic and atraumatic.     Mouth/Throat:     Mouth: Mucous membranes are dry.     Pharynx: Oropharynx is clear.  Eyes:     Conjunctiva/sclera: Conjunctivae normal.  Cardiovascular:     Rate and Rhythm: Normal rate and regular rhythm.     Heart sounds: No murmur heard.    Comments: DP pulses present. Pulmonary:     Effort: Pulmonary effort is normal. No respiratory distress.     Breath sounds: Normal breath sounds.  Abdominal:     Palpations: Abdomen is soft. There is no mass.     Tenderness:  There is abdominal tenderness (Mild "soreness."). There is no guarding or rebound.  Lymphadenopathy:     Cervical: No cervical adenopathy.  Skin:    General: Skin is warm.     Findings: No erythema or rash.  Neurological:     General: No focal deficit present.     Mental Status: She is alert and oriented to person, place, and time.     Cranial Nerves: No cranial nerve deficit.     Comments: Unstable gait, antalgic, assisted by a walker.  Psychiatric:     Comments: Well groomed, good eye contact.   ASSESSMENT AND PLAN:  [name] was seen today for surgery clearance.  Diagnoses and all orders for this visit: Orders Placed This Encounter  Procedures   Basic metabolic panel   CBC   Hemoglobin A1c   Hepatic function panel   Lab Results  Component Value Date   ALT 17 01/26/2021   AST 16 01/26/2021   ALKPHOS 71 01/26/2021   BILITOT 0.9 01/26/2021   Lab Results  Component Value Date   CREATININE 1.22 (H) 01/26/2021  H) 01/26/2021  ° NA 139 01/26/2021  ° K 4.8 01/26/2021  ° CL 104 01/26/2021  ° CO2 27 01/26/2021  ° °Lab Results  °Component Value Date  ° HGBA1C 8.3 (H) 01/26/2021  ° °Lab Results  °Component Value Date  ° WBC 9.1 01/26/2021  ° HGB 14.7 01/26/2021  ° HCT 44.1 01/26/2021  ° MCV 89.7 01/26/2021  ° PLT 192.0 01/26/2021  ° °Pre-op examination °Right knee pain getting worse, affecting daily activities. °We discussed the importance of better glucose controlled peri surgically. °Instructed to hold OTC supplements,multivitamins,and NSAID's at least 7 days before surgery. °She will take her other chronic meds the day before and after surgery when she starts oral intake. °Adequate hydration and avoidance of nephrotoxic agents. °DVT prophylaxis: Early ambulation, consider oral anticoagulation. °We discussed possible side effects of opioid medications for pain management, worsening constipation and delirium. °Cleared for surgery. °Will complete form and fax a long with copy of  office visit. °She has been cleared by her cardiologist. ° °Constipation, unspecified constipation type °Problem is not well controlled. °She agrees with trying Linzess, recommend starting before surgery, so she can evaluate if medication helps. °Adequate hydration and fiber intake. ° °-     linaclotide (LINZESS) 145 MCG CAPS capsule; Take 1 capsule (145 mcg total) by mouth daily before breakfast. ° °Type 2 diabetes mellitus without ophthalmic manifestations (HCC) °HgA1C is not at goal. °No changes in current management. °Keep next appt with Dr. Shamleffer. ° °Mild recurrent major depression (HCC) °Stable otherwise. °Continue Duloxetine 60 mg daily. ° °Return if symptoms worsen or fail to improve, for Keep next f/u appt. ° ° G. , MD ° °[facility]. °[place] office. ° °

## 2021-01-26 ENCOUNTER — Ambulatory Visit (INDEPENDENT_AMBULATORY_CARE_PROVIDER_SITE_OTHER): Payer: PPO | Admitting: Family Medicine

## 2021-01-26 ENCOUNTER — Ambulatory Visit: Payer: PPO | Admitting: *Deleted

## 2021-01-26 ENCOUNTER — Encounter: Payer: Self-pay | Admitting: Family Medicine

## 2021-01-26 VITALS — BP 124/80 | HR 66 | Resp 16 | Ht 65.0 in | Wt 202.4 lb

## 2021-01-26 DIAGNOSIS — M797 Fibromyalgia: Secondary | ICD-10-CM

## 2021-01-26 DIAGNOSIS — G629 Polyneuropathy, unspecified: Secondary | ICD-10-CM

## 2021-01-26 DIAGNOSIS — M4712 Other spondylosis with myelopathy, cervical region: Secondary | ICD-10-CM

## 2021-01-26 DIAGNOSIS — E119 Type 2 diabetes mellitus without complications: Secondary | ICD-10-CM

## 2021-01-26 DIAGNOSIS — K5900 Constipation, unspecified: Secondary | ICD-10-CM

## 2021-01-26 DIAGNOSIS — Z01818 Encounter for other preprocedural examination: Secondary | ICD-10-CM | POA: Diagnosis not present

## 2021-01-26 DIAGNOSIS — F330 Major depressive disorder, recurrent, mild: Secondary | ICD-10-CM

## 2021-01-26 DIAGNOSIS — R2681 Unsteadiness on feet: Secondary | ICD-10-CM

## 2021-01-26 DIAGNOSIS — M159 Polyosteoarthritis, unspecified: Secondary | ICD-10-CM

## 2021-01-26 DIAGNOSIS — F419 Anxiety disorder, unspecified: Secondary | ICD-10-CM

## 2021-01-26 DIAGNOSIS — I10 Essential (primary) hypertension: Secondary | ICD-10-CM

## 2021-01-26 LAB — HEPATIC FUNCTION PANEL
ALT: 17 U/L (ref 0–35)
AST: 16 U/L (ref 0–37)
Albumin: 4 g/dL (ref 3.5–5.2)
Alkaline Phosphatase: 71 U/L (ref 39–117)
Bilirubin, Direct: 0.1 mg/dL (ref 0.0–0.3)
Total Bilirubin: 0.9 mg/dL (ref 0.2–1.2)
Total Protein: 6.8 g/dL (ref 6.0–8.3)

## 2021-01-26 LAB — CBC
HCT: 44.1 % (ref 36.0–46.0)
Hemoglobin: 14.7 g/dL (ref 12.0–15.0)
MCHC: 33.2 g/dL (ref 30.0–36.0)
MCV: 89.7 fl (ref 78.0–100.0)
Platelets: 192 10*3/uL (ref 150.0–400.0)
RBC: 4.92 Mil/uL (ref 3.87–5.11)
RDW: 13.9 % (ref 11.5–15.5)
WBC: 9.1 10*3/uL (ref 4.0–10.5)

## 2021-01-26 LAB — BASIC METABOLIC PANEL
BUN: 31 mg/dL — ABNORMAL HIGH (ref 6–23)
CO2: 27 mEq/L (ref 19–32)
Calcium: 10.3 mg/dL (ref 8.4–10.5)
Chloride: 104 mEq/L (ref 96–112)
Creatinine, Ser: 1.22 mg/dL — ABNORMAL HIGH (ref 0.40–1.20)
GFR: 42.53 mL/min — ABNORMAL LOW (ref >60.00)
Glucose, Bld: 138 mg/dL — ABNORMAL HIGH (ref 70–99)
Potassium: 4.8 mEq/L (ref 3.5–5.1)
Sodium: 139 mEq/L (ref 135–145)

## 2021-01-26 LAB — HEMOGLOBIN A1C: Hgb A1c MFr Bld: 8.3 % — ABNORMAL HIGH (ref 4.6–6.5)

## 2021-01-26 MED ORDER — LINACLOTIDE 145 MCG PO CAPS: 145.0000 ug | ORAL_CAPSULE | Freq: Every day | ORAL | 1 refills | Status: DC

## 2021-01-26 NOTE — Chronic Care Management (AMB) (Signed)
Chronic Care Management    Clinical Social Work Note  01/26/2021 Name: RENELLE STEGENGA MRN: [id number] DOB: 02-23-1940  MIKYA DON is a 80 y.o. year old female who is a primary care patient of [name], Malka So, MD. The CCM team was consulted to assist the patient with chronic disease management and/or care coordination needs related to: [employer], Level of Care Concerns, and Mental Health Counseling and Resources.   Engaged with patient by telephone for follow up visit in response to provider referral for social work chronic care management and care coordination services.   Consent to Services:  The patient was given information about Chronic Care Management services, agreed to services, and gave verbal consent prior to initiation of services.  Please see initial visit note for detailed documentation.   Patient agreed to services and consent obtained.   Assessment: Review of patient past medical history, allergies, medications, and health status, including review of relevant consultants reports was performed today as part of a comprehensive evaluation and provision of chronic care management and care coordination services.     SDOH (Social Determinants of Health) assessments and interventions performed:    Advanced Directives Status: Not addressed in this encounter.  CCM Care Plan  Allergies  Allergen Reactions   Amoxil [Amoxicillin] Swelling and Other (See Comments)    Lips Has patient had a PCN reaction causing immediate rash, facial/tongue/throat swelling, SOB or lightheadedness with hypotension:YES Has patient had a PCN reaction causing severe rash involving mucus membranes or skin necrosis:No Has patient had a PCN reaction that required hospitalization:No Has patient had a PCN reaction occurring within the last 10 years:Yes. If all of the above answers are "NO", then may proceed with Cephalosporin use.    Lisinopril Itching and Swelling    SWELLING REACTION  UNSPECIFIED    Chocolate Hives   Demerol Nausea And Vomiting   Other Other (See Comments)    Raw food or nuts - GI pain    Outpatient Encounter Medications as of 01/26/2021  Medication Sig Note   acetaminophen (TYLENOL) 325 MG tablet Take 325 mg by mouth every 6 (six) hours as needed.    aspirin EC 81 MG tablet Take 81 mg by mouth at bedtime.     Calcium Carb-Cholecalciferol 600-200 MG-UNIT TABS Take 1 tablet by mouth in the morning and at bedtime.     carbamide peroxide (DEBROX) 6.5 % OTIC solution Place 5 drops into the left ear 2 (two) times daily. 11/11/2019: As needed   Continuous Blood Gluc Receiver (DEXCOM G6 RECEIVER) DEVI 1 Device by Does not apply route daily.    Continuous Blood Gluc Sensor (DEXCOM G6 SENSOR) MISC 1 kit by Does not apply route as directed.    Continuous Blood Gluc Transmit (DEXCOM G6 TRANSMITTER) MISC 1 kit by Does not apply route as directed.    Cyanocobalamin (VITAMIN B 12 PO) Take 1,000 mg by mouth daily.    digoxin (LANOXIN) 0.125 MG tablet Take 1 tablet (0.125 mg total) by mouth daily.    docusate sodium (COLACE) 100 MG capsule Take 100 mg by mouth daily. twice a day    DULoxetine (CYMBALTA) 30 MG capsule TAKE 2 CAPSULES BY MOUTH EVERY DAY    ezetimibe (ZETIA) 10 MG tablet TAKE 1 TABLET BY MOUTH EVERY DAY    hydrOXYzine (ATARAX/VISTARIL) 25 MG tablet Take 0.5-1 tablets (12.5-25 mg total) by mouth every 12 (twelve) hours as needed for anxiety.    insulin aspart (NOVOLOG FLEXPEN) 100 UNIT/ML FlexPen Max  daily 75 units per correction scale    insulin glargine (LANTUS SOLOSTAR) 100 UNIT/ML Solostar Pen Inject 26 Units into the skin daily.    Insulin Pen Needle (BD PEN NEEDLE MICRO U/F) 32G X 6 MM MISC Inject 1 Device into the skin QID. USE 4 TIMES A DAY NOVOLOG(3) AND LANTUS9(1)    linaclotide (LINZESS) 145 MCG CAPS capsule Take 1 capsule (145 mcg total) by mouth daily before breakfast.    meloxicam (MOBIC) 15 MG tablet TAKE 1 TABLET (15 MG TOTAL) BY MOUTH DAILY.     metoprolol succinate (TOPROL-XL) 25 MG 24 hr tablet TAKE 1 TABLET BY MOUTH DAILY. PLEASE MAKE OVERDUE APPT WITH DR. Marlou Porch BEFORE ANYMORE REFILLS.    Multiple Vitamins-Minerals (PRESERVISION AREDS 2 PO) Take 1 tablet by mouth 2 (two) times daily.     MYRBETRIQ 50 MG TB24 tablet TAKE 1 TABLET BY MOUTH EVERY DAY    omega-3 acid ethyl esters (LOVAZA) 1 g capsule Take 1 capsule (1 g total) by mouth 2 (two) times daily.    omeprazole (PRILOSEC) 20 MG capsule Take 1 capsule (20 mg total) by mouth daily.    PFIZER-BIONT COVID-19 VAC-TRIS SUSP injection     pregabalin (LYRICA) 75 MG capsule TAKE 1 CAPSULE BY MOUTH 3 TIMES DAILY.    ramipril (ALTACE) 10 MG capsule TAKE 1 CAPSULE BY MOUTH EVERY EVENING.    RELION INSULIN SYRINGE 1ML/31G 31G X 5/16" 1 ML MISC Use as directed    rOPINIRole (REQUIP) 1 MG tablet TAKE 1 TABLET BY MOUTH EVERYDAY AT BEDTIME    rosuvastatin (CRESTOR) 10 MG tablet TAKE 1 TABLET BY MOUTH EVERY DAY    SHINGRIX injection     verapamil (CALAN) 120 MG tablet Take 1 tablet (120 mg total) by mouth daily as needed (afib).    verapamil (VERELAN PM) 240 MG 24 hr capsule TAKE 1 CAPSULE BY MOUTH EVERY DAY    VITAMIN D PO Take 10,000 Units by mouth daily. 10,000 in the am and 5,000 in the pm    No facility-administered encounter medications on file as of 01/26/2021.    Patient Active Problem List   Diagnosis Date Noted   Type 2 diabetes mellitus with hyperglycemia, with long-term current use of insulin ([place]) 12/02/2020   Coronary artery calcification 11/23/2020   Anxiety disorder 11/19/2020   Aortic atherosclerosis ([place]) 09/20/2019   Hyperlipidemia associated with type 2 diabetes mellitus ([place]) 09/17/2019   Mild recurrent major depression ([place]) 09/17/2019   Type 2 diabetes mellitus with diabetic polyneuropathy, with long-term current use of insulin ([place]) 09/04/2019   Fatigue 09/03/2019   Dysuria 09/03/2019   Peripheral neuropathy 02/28/2019   Thoracic myelopathy 01/08/2019    Cervical spine fracture ([place]) 01/22/2018   Weakness 01/21/2018   Closed C7 fracture ([place]) 01/21/2018   Fall    Lower urinary tract infectious disease    Cervical spondylosis with myelopathy 11/12/2017   Tinnitus of both ears 10/19/2017   Rhinorrhea 09/26/2017   Chronic diarrhea 08/08/2017   S/P left TKA 04/10/2017   Unstable gait 01/12/2017   Generalized osteoarthritis of hand 01/12/2017   RLS (restless legs syndrome) 11/09/2016   GERD (gastroesophageal reflux disease) 07/13/2016   Morbid obesity ([place]) 06/01/2016   Vitamin D deficiency 06/01/2016   Vitamin B12 deficiency 03/27/2016   Chronic cough 03/16/2016   Left foot pain 03/16/2016   Degenerative drusen 07/13/2013   Type 2 diabetes mellitus without ophthalmic manifestations ([place]) 07/13/2013   DM (diabetes mellitus) type II controlled, neurological manifestation ([place]) 05/07/2012  SVT (supraventricular tachycardia) ([place]) 05/07/2012   Essential hypertension, benign 05/07/2012   Fibromyalgia 05/07/2012    Conditions to be addressed/monitored: HTN, DMII, and Anxiety.  Limited Social Support, Level of Care Concerns, ADL/IADL Limitations, Mental Health Concerns, Social Isolation, Limited Access to Caregiver, Memory Deficits, and Lacks Knowledge of [employer].  Care Plan : LCSW Plan of Care  Updates made by Francis Gaines, LCSW since 01/26/2021 12:00 AM     Problem: Improve My Quality of Life through [place] [facility].   Priority: High     Long-Range Goal: Improve My Quality of Life through [place] [facility].   Start Date: 12/06/2020  Expected End Date: 03/08/2021  This Visit's Progress: On track  Recent Progress: On track  Priority: High  Note:   Current Barriers:  Chronic disease management support, education, resource and referral needs related to Mobility Issues, Fall Risk, Weakness, Fatigue, Morbid Obesity and Chronic Fibromyalgia  Pain. Limited ability to perform activities of daily living independently and inability to self-administer medications as prescribed.   Clinical Goal(s):  Patient will work with LCSW to identify and address any acute and/or chronic care coordination needs, as it relates to self-health management.   Patient and daughters desire for patient to receive placement into a long-term memory care assisted living facility.   LCSW will submit appropriate paperwork to [facility] to try and obtain prior approval and authorization for short-term skilled nursing placement. LCSW Interventions:  Inter-disciplinary care team collaboration (see longitudinal plan of care). Collaboration with Primary Care Physician, Dr. Betty [name] regarding development and update of comprehensive plan of care as evidenced by provider attestation and co-signature. Confirmation received from patient that she continues to receive 24 hour care and supervision in the home, through a private agency sitter. Patient Goals/Self-Care Activities:  Continue to work with LCSW on a monthly basis, in an effort to coordinate long-term memory care assisted living facility placement.   Collaboration with admissions coordinator at [facility], to learn that you are now # 1 on the waiting list for bed availability. ~ Please decide if you wish for LCSW to continue to pursue long-term memory care assisted living facility placement for you.   ~ Per our conversation today, you indicated that you would decline the bed offer from [facility], if the bed were to come available within the next few months.   Please make arrangements to schedule your right total knee replacement, as you obtained medical clearance from Primary Care Physician, Dr. Betty [name], during follow-up appointment on 01/26/2021. Please notify LCSW if you would like assistance establishing psychiatric services, or need a list of providers that accept [employer]  Medicare. Contact LCSW directly (# [phone]) if you have questions, need assistance, or if additional social work needs are identified between now and our next scheduled telephone outreach call.   Follow-Up Plan:  LCSW will make contact with patient on 02/24/2021 at 1:30 pm.     Nat Christen LCSW Licensed Clinical Social Worker [place] [phone]

## 2021-01-26 NOTE — Patient Instructions (Addendum)
A few things to remember from today's visit:  Pre-op examination - Plan: Basic metabolic panel, CBC, Hemoglobin A1c  Constipation, unspecified constipation type - Plan: linaclotide (LINZESS) 145 MCG CAPS capsule  Type 2 diabetes mellitus without ophthalmic manifestations ([place])  If you need refills please call your pharmacy. Do not use My Chart to request refills or for acute issues that need immediate attention.   Stop all over the counter vitamins and supplements at least 8 days before surgery. Stop Meloxicam and Aspirin at least 7 days before surgery.  You can take rest of your meds the day before surgery and resume after surgery.  It is important to have blood sugars well controlled to improve healing and decrease risk of infections.  Constipation may get worse with pain medications, so try Linzess before surgery and see if it helps. Adequate hydration and fiber intake are important.  Please be sure medication list is accurate. If a new problem present, please set up appointment sooner than planned today.

## 2021-01-26 NOTE — Patient Instructions (Signed)
Visit Information  Thank you for taking time to visit with me today. Please don't hesitate to contact me if I can be of assistance to you before our next scheduled telephone appointment.  Following are the goals we discussed today:   Patient Goals/Self-Care Activities:  Continue to work with LCSW on a monthly basis, in an effort to coordinate long-term memory care assisted living facility placement.   Collaboration with admissions coordinator at [facility], to learn that you are now # 1 on the waiting list for bed availability. ~ Please decide if you wish for LCSW to continue to pursue long-term memory care assisted living facility placement for you.   ~ Per our conversation today, you indicated that you would decline the bed offer from [facility], if the bed were to come available within the next few months.   Please make arrangements to schedule your right total knee replacement, as you obtained medical clearance from Primary Care Physician, Dr. Betty [name], during follow-up appointment on 01/26/2021. Please notify LCSW if you would like assistance establishing psychiatric services, or need a list of providers that accept [employer] Medicare. Contact LCSW directly (# [phone]) if you have questions, need assistance, or if additional social work needs are identified between now and our next scheduled telephone outreach call.    Follow-Up Plan:  LCSW will make contact with patient on 02/24/2021 at 1:30 pm.  Please call the care guide team at [phone] if you need to cancel or reschedule your appointment.   If you are experiencing a Mental Health or [place] or need someone to talk to, please call the Suicide and Crisis Lifeline: 988 call the [place] National Suicide Prevention Lifeline: 475-714-4083 or TTY: 737-027-1445 TTY 501-738-1808) to talk to a trained counselor call 1-800-273-TALK (toll free, 24 hour hotline) go to [facility] [address] [place] [phone]) call the [place]: [phone] call 911   Patient verbalizes understanding of instructions provided today and agrees to view in [place].   Nat Christen LCSW Licensed Clinical Social Worker [place] [phone]

## 2021-01-28 DIAGNOSIS — E119 Type 2 diabetes mellitus without complications: Secondary | ICD-10-CM | POA: Diagnosis not present

## 2021-01-28 DIAGNOSIS — E1149 Type 2 diabetes mellitus with other diabetic neurological complication: Secondary | ICD-10-CM | POA: Diagnosis not present

## 2021-02-07 ENCOUNTER — Other Ambulatory Visit: Payer: Self-pay | Admitting: Cardiology

## 2021-02-15 ENCOUNTER — Encounter (HOSPITAL_BASED_OUTPATIENT_CLINIC_OR_DEPARTMENT_OTHER): Payer: Self-pay

## 2021-02-15 ENCOUNTER — Emergency Department (HOSPITAL_BASED_OUTPATIENT_CLINIC_OR_DEPARTMENT_OTHER)
Admission: EM | Admit: 2021-02-15 | Discharge: 2021-02-15 | Disposition: A | Discharge status: Home / Self Care | Payer: HMO | Attending: Emergency Medicine | Admitting: Emergency Medicine

## 2021-02-15 ENCOUNTER — Other Ambulatory Visit: Payer: Self-pay

## 2021-02-15 ENCOUNTER — Telehealth: Payer: Self-pay

## 2021-02-15 ENCOUNTER — Emergency Department (HOSPITAL_BASED_OUTPATIENT_CLINIC_OR_DEPARTMENT_OTHER): Payer: HMO

## 2021-02-15 DIAGNOSIS — Z7982 Long term (current) use of aspirin: Secondary | ICD-10-CM | POA: Diagnosis not present

## 2021-02-15 DIAGNOSIS — J341 Cyst and mucocele of nose and nasal sinus: Secondary | ICD-10-CM | POA: Diagnosis not present

## 2021-02-15 DIAGNOSIS — G319 Degenerative disease of nervous system, unspecified: Secondary | ICD-10-CM | POA: Diagnosis not present

## 2021-02-15 DIAGNOSIS — Z794 Long term (current) use of insulin: Secondary | ICD-10-CM | POA: Diagnosis not present

## 2021-02-15 DIAGNOSIS — Z20822 Contact with and (suspected) exposure to covid-19: Secondary | ICD-10-CM | POA: Insufficient documentation

## 2021-02-15 DIAGNOSIS — R531 Weakness: Secondary | ICD-10-CM | POA: Diagnosis not present

## 2021-02-15 DIAGNOSIS — N3000 Acute cystitis without hematuria: Secondary | ICD-10-CM | POA: Diagnosis not present

## 2021-02-15 DIAGNOSIS — S0990XA Unspecified injury of head, initial encounter: Secondary | ICD-10-CM | POA: Diagnosis not present

## 2021-02-15 LAB — URINALYSIS, MICROSCOPIC (REFLEX): RBC / HPF: NONE SEEN RBC/hpf (ref 0–5)

## 2021-02-15 LAB — URINALYSIS, ROUTINE W REFLEX MICROSCOPIC
Bilirubin Urine: NEGATIVE
Glucose, UA: NEGATIVE mg/dL
Hgb urine dipstick: NEGATIVE
Ketones, ur: NEGATIVE mg/dL
Nitrite: POSITIVE — AB
Protein, ur: 100 mg/dL — AB
Specific Gravity, Urine: 1.025 (ref 1.005–1.030)
pH: 6 (ref 5.0–8.0)

## 2021-02-15 LAB — BASIC METABOLIC PANEL
Anion gap: 9 (ref 5–15)
BUN: 34 mg/dL — ABNORMAL HIGH (ref 8–23)
CO2: 23 mmol/L (ref 22–32)
Calcium: 9.2 mg/dL (ref 8.9–10.3)
Chloride: 105 mmol/L (ref 98–111)
Creatinine, Ser: 1.36 mg/dL — ABNORMAL HIGH (ref 0.44–1.00)
GFR, Estimated: 40 mL/min — ABNORMAL LOW (ref >60)
Glucose, Bld: 204 mg/dL — ABNORMAL HIGH (ref 70–99)
Potassium: 4.2 mmol/L (ref 3.5–5.1)
Sodium: 137 mmol/L (ref 135–145)

## 2021-02-15 LAB — CBC
HCT: 41.8 % (ref 36.0–46.0)
Hemoglobin: 14.4 g/dL (ref 12.0–15.0)
MCH: 30.5 pg (ref 26.0–34.0)
MCHC: 34.4 g/dL (ref 30.0–36.0)
MCV: 88.6 fL (ref 80.0–100.0)
Platelets: 188 10*3/uL (ref 150–400)
RBC: 4.72 MIL/uL (ref 3.87–5.11)
RDW: 13 % (ref 11.5–15.5)
WBC: 7.7 10*3/uL (ref 4.0–10.5)
nRBC: 0 % (ref 0.0–0.2)

## 2021-02-15 LAB — RESP PANEL BY RT-PCR (FLU A&B, COVID) ARPGX2
Influenza A by PCR: NEGATIVE
Influenza B by PCR: NEGATIVE
SARS Coronavirus 2 by RT PCR: NEGATIVE

## 2021-02-15 MED ORDER — SULFAMETHOXAZOLE-TRIMETHOPRIM 800-160 MG PO TABS: 1.0000 | ORAL_TABLET | Freq: Two times a day (BID) | ORAL | 0 refills | Status: AC

## 2021-02-15 MED ORDER — SULFAMETHOXAZOLE-TRIMETHOPRIM 800-160 MG PO TABS
1.0000 | ORAL_TABLET | Freq: Once | ORAL | Status: AC
  Administered 2021-02-15: 1 via ORAL
  Filled 2021-02-15: qty 1

## 2021-02-15 NOTE — ED Notes (Signed)
Patient discharged to home.  All discharge instructions reviewed.  Patient verbalized understanding via teachback method.  VS WDL.  Respirations even and unlabored.  Wheelchair out of ED.

## 2021-02-15 NOTE — Telephone Encounter (Signed)
Per patient daughter patient was given a extra 12 units of Lantus at lunch time today. Patient had already taking her 24 units this morning. Want to know what they should do.

## 2021-02-15 NOTE — ED Triage Notes (Signed)
Per pt and daughter pt with weakness x 2 weeks-denies fever/flu sx-recent falls-was advised by triage nurse PCP to come to ED-pt NAD-to triage in own w/c

## 2021-02-15 NOTE — ED Provider Notes (Signed)
[facility] Provider Note   CSN: [id number] Arrival date & time: 02/15/21  1548     History  Chief Complaint  Patient presents with   Weakness    [name] is a 81 y.o. female.  The history is provided by the patient.  Weakness Severity:  Mild Onset quality:  Gradual Duration:  4 weeks Timing:  Intermittent Progression:  Waxing and waning Chronicity:  Recurrent Relieved by:  Nothing Worsened by:  Nothing Associated symptoms: difficulty walking, falls, foul-smelling urine and frequency   Associated symptoms: no abdominal pain, no anorexia, no aphasia, no arthralgias, no ataxia, no chest pain, no cough, no diarrhea, no dizziness, no drooling, no dysphagia, no dysuria, no numbness in extremities, no fever, no seizures, no shortness of breath and no vomiting       Home Medications Prior to Admission medications   Medication Sig Start Date End Date Taking? Authorizing Provider  sulfamethoxazole-trimethoprim (BACTRIM DS) 800-160 MG tablet Take 1 tablet by mouth 2 (two) times daily for 5 days. 02/15/21 02/20/21 Yes Bevely Hackbart, DO  acetaminophen (TYLENOL) 325 MG tablet Take 325 mg by mouth every 6 (six) hours as needed.    [provider]  aspirin EC 81 MG tablet Take 81 mg by mouth at bedtime.     [provider]  Calcium Carb-Cholecalciferol 600-200 MG-UNIT TABS Take 1 tablet by mouth in the morning and at bedtime.     [provider]  carbamide peroxide (DEBROX) 6.5 % OTIC solution Place 5 drops into the left ear 2 (two) times daily. 04/23/19   [name], [name], MD  Continuous Blood Gluc Receiver (DEXCOM G6 RECEIVER) DEVI 1 Device by Does not apply route daily. 06/07/18   Shamleffer, Melanie Crazier, MD  Continuous Blood Gluc Sensor (DEXCOM G6 SENSOR) MISC 1 kit by Does not apply route as directed. 06/07/18   Shamleffer, Melanie Crazier, MD  Continuous Blood Gluc Transmit (DEXCOM G6 TRANSMITTER) MISC 1 kit by Does not  apply route as directed. 06/07/18   Shamleffer, Melanie Crazier, MD  Cyanocobalamin (VITAMIN B 12 PO) Take 1,000 mg by mouth daily.    [provider]  digoxin (LANOXIN) 0.125 MG tablet TAKE 1 TABLET BY MOUTH DAILY 02/08/21   Jerline Pain, MD  docusate sodium (COLACE) 100 MG capsule Take 100 mg by mouth daily. twice a day    [provider]  DULoxetine (CYMBALTA) 30 MG capsule TAKE 2 CAPSULES BY MOUTH EVERY DAY 09/01/20   Raulkar, Clide Deutscher, MD  ezetimibe (ZETIA) 10 MG tablet TAKE 1 TABLET BY MOUTH EVERY DAY 11/05/20   [name], [name], MD  insulin aspart (NOVOLOG FLEXPEN) 100 UNIT/ML FlexPen Max daily 75 units per correction scale 12/02/20   Shamleffer, Melanie Crazier, MD  insulin glargine (LANTUS SOLOSTAR) 100 UNIT/ML Solostar Pen Inject 26 Units into the skin daily. 12/02/20   Shamleffer, Melanie Crazier, MD  Insulin Pen Needle (BD PEN NEEDLE MICRO U/F) 32G X 6 MM MISC Inject 1 Device into the skin QID. USE 4 TIMES A DAY NOVOLOG(3) AND LANTUS9(1) 12/02/20   Shamleffer, Melanie Crazier, MD  linaclotide [facility]) 145 MCG CAPS capsule Take 1 capsule (145 mcg total) by mouth daily before breakfast. 01/26/21   [name], [name], MD  meloxicam (MOBIC) 15 MG tablet TAKE 1 TABLET (15 MG TOTAL) BY MOUTH DAILY. 12/27/20   Raulkar, Clide Deutscher, MD  metoprolol succinate (TOPROL-XL) 25 MG 24 hr tablet TAKE 1 TABLET BY MOUTH DAILY. PLEASE MAKE OVERDUE APPT  WITH DR. Marlou Porch BEFORE ANYMORE REFILLS. 12/13/20   Jerline Pain, MD  Multiple Vitamins-Minerals (PRESERVISION AREDS 2 PO) Take 1 tablet by mouth 2 (two) times daily.     [provider]  MYRBETRIQ 50 MG TB24 tablet TAKE 1 TABLET BY MOUTH EVERY DAY 09/06/20   [name], [name], MD  omega-3 acid ethyl esters (LOVAZA) 1 g capsule Take 1 capsule (1 g total) by mouth 2 (two) times daily. 09/03/20   [name], [name], MD  omeprazole (PRILOSEC) 20 MG capsule Take 1 capsule (20 mg total) by mouth daily. 05/01/17   [name], [name], MD  PFIZER-BIONT  COVID-19 VAC-TRIS SUSP injection  08/05/20   [provider]  pregabalin (LYRICA) 75 MG capsule TAKE 1 CAPSULE BY MOUTH 3 TIMES DAILY. 01/21/21   Raulkar, Clide Deutscher, MD  ramipril (ALTACE) 10 MG capsule TAKE 1 CAPSULE BY MOUTH EVERY EVENING. 01/17/21   [name], [name], MD  RELION INSULIN SYRINGE 1ML/31G 31G X 5/16" 1 ML MISC Use as directed 01/30/17   [name], [name], MD  rOPINIRole (REQUIP) 1 MG tablet TAKE 1 TABLET BY MOUTH EVERYDAY AT BEDTIME 10/04/20   [name], [name], MD  rosuvastatin (CRESTOR) 10 MG tablet TAKE 1 TABLET BY MOUTH EVERY DAY 09/27/20   [name], [name], MD  [facility] injection  08/05/20   [provider]  verapamil (CALAN) 120 MG tablet Take 1 tablet (120 mg total) by mouth daily as needed (afib). 11/23/20   Jerline Pain, MD  verapamil (VERELAN PM) 240 MG 24 hr capsule TAKE 1 CAPSULE BY MOUTH EVERY DAY 05/05/20   Jerline Pain, MD  VITAMIN D PO Take 10,000 Units by mouth daily. 10,000 in the am and 5,000 in the pm    [provider]      Allergies    Amoxil [amoxicillin], Lisinopril, Chocolate, Demerol, and Other    Review of Systems   Review of Systems  Constitutional:  Negative for chills and fever.  HENT:  Negative for drooling, ear pain and sore throat.   Eyes:  Negative for pain and visual disturbance.  Respiratory:  Negative for cough and shortness of breath.   Cardiovascular:  Negative for chest pain and palpitations.  Gastrointestinal:  Negative for abdominal pain, anorexia, diarrhea, dysphagia and vomiting.  Genitourinary:  Positive for frequency. Negative for dysuria and hematuria.  Musculoskeletal:  Positive for back pain, falls and gait problem. Negative for arthralgias.  Skin:  Negative for color change and rash.  Neurological:  Positive for weakness. Negative for dizziness, seizures and syncope.  All other systems reviewed and are negative.  Physical Exam Updated Vital Signs BP (!) 191/89 (BP Location: Right Arm)    Pulse 64    Temp  98 F (36.7 C) (Oral)    Resp 16    Ht 5' 3"  (1.6 m)    Wt 92.1 kg    SpO2 97%    BMI 35.96 kg/m  Physical Exam Vitals and nursing note reviewed.  Constitutional:      General: She is not in acute distress.    Appearance: She is well-developed. She is not ill-appearing.  HENT:     Head: Normocephalic and atraumatic.     Nose: Nose normal.     Mouth/Throat:     Mouth: Mucous membranes are moist.  Eyes:     Extraocular Movements: Extraocular movements intact.     Conjunctiva/sclera: Conjunctivae normal.     Pupils: Pupils are equal, round, and reactive to  light.  Cardiovascular:     Rate and Rhythm: Normal rate and regular rhythm.     Pulses: Normal pulses.     Heart sounds: Normal heart sounds. No murmur heard. Pulmonary:     Effort: Pulmonary effort is normal. No respiratory distress.     Breath sounds: Normal breath sounds.  Abdominal:     Palpations: Abdomen is soft.     Tenderness: There is no abdominal tenderness.  Musculoskeletal:        General: No swelling. Normal range of motion.     Cervical back: Normal range of motion and neck supple.  Skin:    General: Skin is warm and dry.     Capillary Refill: Capillary refill takes less than 2 seconds.  Neurological:     General: No focal deficit present.     Mental Status: She is alert. Mental status is at baseline.     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    ED Results / Procedures / Treatments   Labs (all labs ordered are listed, but only abnormal results are displayed) Labs Reviewed  BASIC METABOLIC PANEL - Abnormal; Notable for the following components:      Result Value   Glucose, Bld 204 (*)     BUN 34 (*)    Creatinine, Ser 1.36 (*)    GFR, Estimated 40 (*)    All other components within normal limits  URINALYSIS, ROUTINE W REFLEX MICROSCOPIC - Abnormal; Notable for the following components:   Protein, ur 100 (*)    Nitrite POSITIVE (*)    Leukocytes,Ua MODERATE (*)    All other components within normal limits  URINALYSIS, MICROSCOPIC (REFLEX) - Abnormal; Notable for the following components:   Bacteria, UA MANY (*)    All other components within normal limits  RESP PANEL BY RT-PCR (FLU A&B, COVID) [id number]  CBC    EKG EKG Interpretation  Date/Time:  Tuesday February 15 2021 16:38:07 EST Ventricular Rate:  63 PR Interval:  178 QRS Duration: 90 QT Interval:  402 QTC Calculation: 411 R Axis:   118 Text Interpretation: Normal sinus rhythm Right axis deviation Possible Anterior infarct , age undetermined Abnormal ECG When compared with ECG of 22-Jan-2018 07:05, PREVIOUS ECG IS PRESENT Confirmed by Lennice Sites ([id number]) on 02/15/2021 4:44:06 PM  Radiology CT Head Wo Contrast  Result Date: 02/15/2021 CLINICAL DATA:  Head trauma, moderate-severe fall EXAM: CT HEAD WITHOUT CONTRAST TECHNIQUE: Contiguous axial images were obtained from the base of the skull through the vertex without intravenous contrast. COMPARISON:  Head CT 01/21/2018 FINDINGS: Brain: Age related atrophy with mild periventricular chronic small vessel ischemia. No intracranial hemorrhage, mass effect, or midline shift. No hydrocephalus. The basilar cisterns are patent. No evidence of territorial infarct or acute ischemia. No extra-axial or intracranial fluid collection. Vascular: No hyperdense vessel or unexpected calcification. Skull: No fracture or focal lesion. Sinuses/Orbits: Paranasal sinuses and mastoid air cells are clear. Small mucous retention cyst in the right maxillary sinus. The visualized orbits are unremarkable. Bilateral cataract resection. Other: No confluent scalp hematoma. IMPRESSION: 1. No acute  intracranial abnormality. No skull fracture. 2. Age related atrophy and chronic small vessel  ischemia. Electronically Signed   By: Keith Rake M.D.   On: 02/15/2021 20:08   DG Chest Portable 1 View  Result Date: 02/15/2021 CLINICAL DATA:  Weakness for 2 weeks EXAM: PORTABLE CHEST 1 VIEW COMPARISON:  12/24/2020 FINDINGS: Single frontal view of the chest demonstrates a stable cardiac silhouette. No airspace disease, effusion, or pneumothorax. Stable postsurgical changes of the cervicothoracic spine. IMPRESSION: 1. No acute intrathoracic process. Electronically Signed   By: Randa Ngo M.D.   On: 02/15/2021 19:29    Procedures Procedures    Medications Ordered in ED Medications  sulfamethoxazole-trimethoprim (BACTRIM DS) 800-160 MG per tablet 1 tablet (1 tablet Oral Given 02/15/21 2148)    ED Course/ Medical Decision Making/ A&P                           Medical Decision Making  KEMAYA DORNER is here with generalized weakness.  May be urinary symptoms.  Unremarkable vitals.  No fever.  History of chronic pain, neck surgery, low back surgery, high cholesterol, fibromyalgia.  Worsening generalized weakness recently.  Having to use wheelchair more often than normal.  Having more frequent falls.  Last fall was today.  She did not think she hit her head.  Overall neurologically she appears to be at her baseline.  No concern for stroke.  No concern for cauda equina or other traumatic process.  Head CT was obtained that was normal.  Lab work was ordered and upon my interpretation they are unremarkable.  No significant electrolyte abnormality or kidney injury leukocytosis.  Chest x-ray negative for infection.  Urinalysis consistent with infection and overall suspect the cause of her generalized weakness here recently.  No concern for sepsis.  Will prescribe antibiotics.  She has support at home with home health aides and multiple assistive devices for ambulation.  Overall she appears stable for  discharge.  Antibiotics prescribed.  This chart was dictated using voice recognition software.  Despite best efforts to proofread,  errors can occur which can change the documentation meaning.         Final Clinical Impression(s) / ED Diagnoses Final diagnoses:  Acute cystitis without hematuria    Rx / DC Orders ED Discharge Orders          Ordered    sulfamethoxazole-trimethoprim (BACTRIM DS) 800-160 MG tablet  2 times daily        02/15/21 2212              Lennice Sites, DO 02/15/21 2212

## 2021-02-15 NOTE — Telephone Encounter (Signed)
Patient daughter [name] advised and states that she will separate medication and continue to monitor blood sugar.  Patient daughter also states that she is taking patient to ED for weakness that she has been experiencing over last 2 weeks.

## 2021-02-17 DIAGNOSIS — F4310 Post-traumatic stress disorder, unspecified: Secondary | ICD-10-CM | POA: Diagnosis not present

## 2021-02-17 DIAGNOSIS — F3342 Major depressive disorder, recurrent, in full remission: Secondary | ICD-10-CM | POA: Diagnosis not present

## 2021-02-17 DIAGNOSIS — D8481 Immunodeficiency due to conditions classified elsewhere: Secondary | ICD-10-CM | POA: Diagnosis not present

## 2021-02-17 DIAGNOSIS — I509 Heart failure, unspecified: Secondary | ICD-10-CM | POA: Diagnosis not present

## 2021-02-17 DIAGNOSIS — E1142 Type 2 diabetes mellitus with diabetic polyneuropathy: Secondary | ICD-10-CM | POA: Diagnosis not present

## 2021-02-17 DIAGNOSIS — I471 Supraventricular tachycardia: Secondary | ICD-10-CM | POA: Diagnosis not present

## 2021-02-17 DIAGNOSIS — M4690 Unspecified inflammatory spondylopathy, site unspecified: Secondary | ICD-10-CM | POA: Diagnosis not present

## 2021-02-17 DIAGNOSIS — E785 Hyperlipidemia, unspecified: Secondary | ICD-10-CM | POA: Diagnosis not present

## 2021-02-17 DIAGNOSIS — Z794 Long term (current) use of insulin: Secondary | ICD-10-CM | POA: Diagnosis not present

## 2021-02-17 DIAGNOSIS — E1169 Type 2 diabetes mellitus with other specified complication: Secondary | ICD-10-CM | POA: Diagnosis not present

## 2021-02-17 DIAGNOSIS — I110 Hypertensive heart disease with heart failure: Secondary | ICD-10-CM | POA: Diagnosis not present

## 2021-02-18 ENCOUNTER — Ambulatory Visit: Payer: Self-pay | Admitting: Adult Health

## 2021-02-18 ENCOUNTER — Telehealth: Payer: Self-pay | Admitting: Family Medicine

## 2021-02-18 ENCOUNTER — Emergency Department (HOSPITAL_COMMUNITY)
Admission: EM | Admit: 2021-02-18 | Discharge: 2021-02-18 | Disposition: A | Discharge status: Home / Self Care | Payer: HMO | Attending: Emergency Medicine | Admitting: Emergency Medicine

## 2021-02-18 ENCOUNTER — Emergency Department (HOSPITAL_COMMUNITY): Payer: HMO

## 2021-02-18 ENCOUNTER — Other Ambulatory Visit: Payer: Self-pay

## 2021-02-18 ENCOUNTER — Encounter (HOSPITAL_COMMUNITY): Payer: Self-pay | Admitting: Oncology

## 2021-02-18 DIAGNOSIS — E119 Type 2 diabetes mellitus without complications: Secondary | ICD-10-CM | POA: Diagnosis not present

## 2021-02-18 DIAGNOSIS — Z7982 Long term (current) use of aspirin: Secondary | ICD-10-CM | POA: Insufficient documentation

## 2021-02-18 DIAGNOSIS — M25561 Pain in right knee: Secondary | ICD-10-CM | POA: Insufficient documentation

## 2021-02-18 DIAGNOSIS — Z794 Long term (current) use of insulin: Secondary | ICD-10-CM | POA: Diagnosis not present

## 2021-02-18 DIAGNOSIS — M25551 Pain in right hip: Secondary | ICD-10-CM | POA: Insufficient documentation

## 2021-02-18 DIAGNOSIS — W010XXA Fall on same level from slipping, tripping and stumbling without subsequent striking against object, initial encounter: Secondary | ICD-10-CM | POA: Insufficient documentation

## 2021-02-18 DIAGNOSIS — I10 Essential (primary) hypertension: Secondary | ICD-10-CM | POA: Diagnosis not present

## 2021-02-18 DIAGNOSIS — Z79899 Other long term (current) drug therapy: Secondary | ICD-10-CM | POA: Insufficient documentation

## 2021-02-18 DIAGNOSIS — Z20822 Contact with and (suspected) exposure to covid-19: Secondary | ICD-10-CM | POA: Insufficient documentation

## 2021-02-18 DIAGNOSIS — W19XXXA Unspecified fall, initial encounter: Secondary | ICD-10-CM | POA: Diagnosis not present

## 2021-02-18 LAB — URINALYSIS, ROUTINE W REFLEX MICROSCOPIC
Bilirubin Urine: NEGATIVE
Glucose, UA: NEGATIVE mg/dL
Hgb urine dipstick: NEGATIVE
Ketones, ur: NEGATIVE mg/dL
Nitrite: NEGATIVE
Protein, ur: 30 mg/dL — AB
Specific Gravity, Urine: 1.018 (ref 1.005–1.030)
pH: 5 (ref 5.0–8.0)

## 2021-02-18 LAB — RESP PANEL BY RT-PCR (FLU A&B, COVID) ARPGX2
Influenza A by PCR: NEGATIVE
Influenza B by PCR: NEGATIVE
SARS Coronavirus 2 by RT PCR: NEGATIVE

## 2021-02-18 LAB — CBC WITH DIFFERENTIAL/PLATELET
Abs Immature Granulocytes: 0.03 10*3/uL (ref 0.00–0.07)
Basophils Absolute: 0.1 10*3/uL (ref 0.0–0.1)
Basophils Relative: 1 %
Eosinophils Absolute: 0.3 10*3/uL (ref 0.0–0.5)
Eosinophils Relative: 4 %
HCT: 40.9 % (ref 36.0–46.0)
Hemoglobin: 13.8 g/dL (ref 12.0–15.0)
Immature Granulocytes: 0 %
Lymphocytes Relative: 39 %
Lymphs Abs: 2.9 10*3/uL (ref 0.7–4.0)
MCH: 30.3 pg (ref 26.0–34.0)
MCHC: 33.7 g/dL (ref 30.0–36.0)
MCV: 89.7 fL (ref 80.0–100.0)
Monocytes Absolute: 0.6 10*3/uL (ref 0.1–1.0)
Monocytes Relative: 8 %
Neutro Abs: 3.6 10*3/uL (ref 1.7–7.7)
Neutrophils Relative %: 48 %
Platelets: 181 10*3/uL (ref 150–400)
RBC: 4.56 MIL/uL (ref 3.87–5.11)
RDW: 13.2 % (ref 11.5–15.5)
WBC: 7.5 10*3/uL (ref 4.0–10.5)
nRBC: 0 % (ref 0.0–0.2)

## 2021-02-18 LAB — BASIC METABOLIC PANEL
Anion gap: 5 (ref 5–15)
BUN: 29 mg/dL — ABNORMAL HIGH (ref 8–23)
CO2: 22 mmol/L (ref 22–32)
Calcium: 9.2 mg/dL (ref 8.9–10.3)
Chloride: 106 mmol/L (ref 98–111)
Creatinine, Ser: 1.52 mg/dL — ABNORMAL HIGH (ref 0.44–1.00)
GFR, Estimated: 35 mL/min — ABNORMAL LOW (ref >60)
Glucose, Bld: 126 mg/dL — ABNORMAL HIGH (ref 70–99)
Potassium: 4.3 mmol/L (ref 3.5–5.1)
Sodium: 133 mmol/L — ABNORMAL LOW (ref 135–145)

## 2021-02-18 LAB — DIGOXIN LEVEL: Digoxin Level: 1.2 ng/mL (ref 0.8–2.0)

## 2021-02-18 MED ORDER — ROSUVASTATIN CALCIUM 10 MG PO TABS: 10.0000 mg | ORAL_TABLET | Freq: Every day | ORAL | Status: DC

## 2021-02-18 MED ORDER — VERAPAMIL HCL 120 MG PO TABS: 120.0000 mg | ORAL_TABLET | Freq: Every day | ORAL | Status: DC | PRN

## 2021-02-18 MED ORDER — INSULIN GLARGINE 100 UNIT/ML SOLOSTAR PEN: 26.0000 [IU] | PEN_INJECTOR | Freq: Every day | SUBCUTANEOUS | Status: DC

## 2021-02-18 MED ORDER — EZETIMIBE 10 MG PO TABS
10.0000 mg | ORAL_TABLET | Freq: Every day | ORAL | Status: DC
Start: 2021-02-18 — End: 2021-02-18

## 2021-02-18 MED ORDER — PREGABALIN 50 MG PO CAPS
75.0000 mg | ORAL_CAPSULE | Freq: Three times a day (TID) | ORAL | Status: DC
  Administered 2021-02-18: 75 mg via ORAL
  Filled 2021-02-18: qty 1

## 2021-02-18 MED ORDER — DIGOXIN 125 MCG PO TABS: 125.0000 ug | ORAL_TABLET | Freq: Every day | ORAL | Status: DC

## 2021-02-18 MED ORDER — MELOXICAM 15 MG PO TABS: 15.0000 mg | ORAL_TABLET | Freq: Every day | ORAL | Status: DC

## 2021-02-18 MED ORDER — PANTOPRAZOLE SODIUM 40 MG PO TBEC
40.0000 mg | DELAYED_RELEASE_TABLET | Freq: Every day | ORAL | Status: DC
  Administered 2021-02-18: 40 mg via ORAL
  Filled 2021-02-18: qty 1

## 2021-02-18 MED ORDER — MIRABEGRON ER 25 MG PO TB24: 50.0000 mg | ORAL_TABLET | Freq: Every day | ORAL | Status: DC

## 2021-02-18 MED ORDER — LINACLOTIDE 145 MCG PO CAPS
145.0000 ug | ORAL_CAPSULE | Freq: Every day | ORAL | Status: DC
Start: 2021-02-19 — End: 2021-02-18

## 2021-02-18 MED ORDER — DULOXETINE HCL 30 MG PO CPEP
60.0000 mg | ORAL_CAPSULE | Freq: Every day | ORAL | Status: DC
  Administered 2021-02-18: 60 mg via ORAL
  Filled 2021-02-18: qty 2

## 2021-02-18 MED ORDER — DOCUSATE SODIUM 100 MG PO CAPS
100.0000 mg | ORAL_CAPSULE | Freq: Every day | ORAL | Status: DC
  Administered 2021-02-18: 100 mg via ORAL
  Filled 2021-02-18: qty 1

## 2021-02-18 MED ORDER — ROPINIROLE HCL 1 MG PO TABS: 1.0000 mg | ORAL_TABLET | Freq: Every day | ORAL | Status: DC

## 2021-02-18 MED ORDER — VERAPAMIL HCL ER 240 MG PO CP24: 240.0000 mg | ORAL_CAPSULE | Freq: Every day | ORAL | Status: DC

## 2021-02-18 MED ORDER — ASPIRIN EC 81 MG PO TBEC: 81.0000 mg | DELAYED_RELEASE_TABLET | Freq: Every day | ORAL | Status: DC

## 2021-02-18 MED ORDER — SULFAMETHOXAZOLE-TRIMETHOPRIM 800-160 MG PO TABS
1.0000 | ORAL_TABLET | Freq: Two times a day (BID) | ORAL | Status: DC
  Administered 2021-02-18: 1 via ORAL
  Filled 2021-02-18: qty 1

## 2021-02-18 MED ORDER — ACETAMINOPHEN 325 MG PO TABS: 325.0000 mg | ORAL_TABLET | Freq: Four times a day (QID) | ORAL | Status: DC | PRN

## 2021-02-18 MED ORDER — METOPROLOL SUCCINATE ER 50 MG PO TB24
25.0000 mg | ORAL_TABLET | Freq: Every day | ORAL | Status: DC
  Administered 2021-02-18: 25 mg via ORAL
  Filled 2021-02-18: qty 1

## 2021-02-18 MED ORDER — RAMIPRIL 10 MG PO CAPS: 10.0000 mg | ORAL_CAPSULE | Freq: Every evening | ORAL | Status: DC

## 2021-02-18 NOTE — ED Notes (Signed)
Family elected to take pt home and attempt placement again via PCP.  D/c instructions d/w pt.  Emotional support provided.

## 2021-02-18 NOTE — ED Provider Notes (Signed)
[facility] DEPT Provider Note   CSN: [id number] Arrival date & time: 02/18/21  1118     History  Chief Complaint  Patient presents with   [name] is a 82 y.o. female.  HPI Patient presents with concern of pain in her right hip.  She had a mechanical fall yesterday, recalls it.  No head trauma, neck trauma.  She seen and evaluated by EMS, declined transport.  Today she was encouraged to seek evaluation by her daughter.  She notes that since yesterday she has had pain, consistently, intermittently, both improved with OTC medication and rest.  Pain is focally in the right hip, though the pain radiates to the right knee.  She notes her medical issues include prior spinal surgery, known osteoarthritis in the right knee.  No distal loss of sensation or weakness, no current head, neck, chest, abdominal pain.    Home Medications Prior to Admission medications   Medication Sig Start Date End Date Taking? Authorizing Provider  acetaminophen (TYLENOL) 500 MG tablet Take 500 mg by mouth every 6 (six) hours as needed for mild pain, fever or headache.   Yes [provider]  aspirin EC 81 MG tablet Take 81 mg by mouth at bedtime.    Yes [provider]  Calcium Carb-Cholecalciferol 600-200 MG-UNIT TABS Take 1 tablet by mouth in the morning and at bedtime.    Yes [provider]  Cholecalciferol (DIALYVITE VITAMIN D 5000) 125 MCG (5000 UT) capsule Take 5,000-10,000 Units by mouth See admin instructions. 10,000 units in the morning 5,000 units in the evening   Yes [provider]  digoxin (LANOXIN) 0.125 MG tablet TAKE 1 TABLET BY MOUTH DAILY Patient taking differently: Take 0.125 mg by mouth daily. 02/08/21  Yes Jerline Pain, MD  docusate sodium (COLACE) 100 MG capsule Take 100 mg by mouth 2 (two) times daily.   Yes [provider]  DULoxetine (CYMBALTA) 30 MG capsule TAKE 2 CAPSULES BY MOUTH EVERY  DAY Patient taking differently: 60 mg every evening. 09/01/20  Yes Raulkar, Clide Deutscher, MD  ezetimibe (ZETIA) 10 MG tablet TAKE 1 TABLET BY MOUTH EVERY DAY Patient taking differently: Take 10 mg by mouth daily. 11/05/20  Yes [name], [name], MD  insulin aspart (NOVOLOG FLEXPEN) 100 UNIT/ML FlexPen Max daily 75 units per correction scale Patient taking differently: Inject 12-16 Units into the skin 3 (three) times daily with meals. 12 units in the morning and midday 16 units in the evening 12/02/20  Yes Shamleffer, Melanie Crazier, MD  insulin glargine (LANTUS SOLOSTAR) 100 UNIT/ML Solostar Pen Inject 26 Units into the skin daily. Patient taking differently: Inject 24 Units into the skin daily. 12/02/20  Yes Shamleffer, Melanie Crazier, MD  meloxicam (MOBIC) 15 MG tablet TAKE 1 TABLET (15 MG TOTAL) BY MOUTH DAILY. Patient taking differently: Take 15 mg by mouth daily. 12/27/20  Yes Raulkar, Clide Deutscher, MD  Menthol, Topical Analgesic, (BLUE-EMU MAXIMUM STRENGTH EX) Apply 1 application topically as needed (pain).   Yes [provider]  metoprolol succinate (TOPROL-XL) 25 MG 24 hr tablet TAKE 1 TABLET BY MOUTH DAILY. PLEASE MAKE OVERDUE APPT WITH DR. Marlou Porch BEFORE ANYMORE REFILLS. Patient taking differently: Take 25 mg by mouth daily. 12/13/20  Yes Jerline Pain, MD  Multiple Vitamins-Minerals (PRESERVISION AREDS 2 PO) Take 1 tablet by mouth 2 (two) times daily.    Yes [provider]  MYRBETRIQ 50 MG TB24 tablet TAKE 1 TABLET BY  MOUTH EVERY DAY Patient taking differently: Take 50 mg by mouth daily. 09/06/20  Yes [name], [name], MD  omega-3 acid ethyl esters (LOVAZA) 1 g capsule Take 1 capsule (1 g total) by mouth 2 (two) times daily. 09/03/20  Yes [name], [name], MD  omeprazole (PRILOSEC) 20 MG capsule Take 1 capsule (20 mg total) by mouth daily. 05/01/17  Yes [name], [name], MD  polyethylene glycol (MIRALAX / GLYCOLAX) 17 g packet Take 17 g by mouth daily as needed for mild  constipation.   Yes [provider]  pregabalin (LYRICA) 75 MG capsule TAKE 1 CAPSULE BY MOUTH 3 TIMES DAILY. Patient taking differently: Take 75 mg by mouth 3 (three) times daily. 01/21/21  Yes Raulkar, Clide Deutscher, MD  ramipril (ALTACE) 10 MG capsule TAKE 1 CAPSULE BY MOUTH EVERY EVENING. Patient taking differently: Take 10 mg by mouth every evening. 01/17/21  Yes [name], [name], MD  rOPINIRole (REQUIP) 1 MG tablet TAKE 1 TABLET BY MOUTH EVERYDAY AT BEDTIME Patient taking differently: Take 1 mg by mouth at bedtime. 10/04/20  Yes [name], [name], MD  rosuvastatin (CRESTOR) 10 MG tablet TAKE 1 TABLET BY MOUTH EVERY DAY Patient taking differently: Take 10 mg by mouth daily. 09/27/20  Yes [name], [name], MD  sulfamethoxazole-trimethoprim (BACTRIM DS) 800-160 MG tablet Take 1 tablet by mouth 2 (two) times daily for 5 days. 02/15/21 02/20/21 Yes Curatolo, Adam, DO  verapamil (VERELAN PM) 240 MG 24 hr capsule TAKE 1 CAPSULE BY MOUTH EVERY DAY Patient taking differently: Take 240 mg by mouth daily. 05/05/20  Yes Jerline Pain, MD  vitamin B-12 (CYANOCOBALAMIN) 1000 MCG tablet Take 1,000 mcg by mouth daily.   Yes [provider]  acetaminophen (TYLENOL) 325 MG tablet Take 325 mg by mouth every 6 (six) hours as needed. Patient not taking: Reported on 02/18/2021    [provider]  Continuous Blood Gluc Receiver (DEXCOM G6 RECEIVER) DEVI 1 Device by Does not apply route daily. 06/07/18   Shamleffer, Melanie Crazier, MD  Continuous Blood Gluc Sensor (DEXCOM G6 SENSOR) MISC 1 kit by Does not apply route as directed. 06/07/18   Shamleffer, Melanie Crazier, MD  Continuous Blood Gluc Transmit (DEXCOM G6 TRANSMITTER) MISC 1 kit by Does not apply route as directed. 06/07/18   Shamleffer, Melanie Crazier, MD  Insulin Pen Needle (BD PEN NEEDLE MICRO U/F) 32G X 6 MM MISC Inject 1 Device into the skin QID. USE 4 TIMES A DAY NOVOLOG(3) AND LANTUS9(1) 12/02/20   Shamleffer, Melanie Crazier, MD   linaclotide [facility]) 145 MCG CAPS capsule Take 1 capsule (145 mcg total) by mouth daily before breakfast. Patient not taking: Reported on 02/18/2021 01/26/21   [name], [name], MD  PFIZER-BIONT COVID-19 VAC-TRIS SUSP injection  08/05/20   [provider]  [place] 1ML/31G 31G X 5/16" 1 ML MISC Use as directed 01/30/17   [name], [name], MD  [facility] injection  08/05/20   [provider]  verapamil (CALAN) 120 MG tablet Take 1 tablet (120 mg total) by mouth daily as needed (afib). Patient not taking: Reported on 02/18/2021 11/23/20   Jerline Pain, MD      Allergies    Amoxil [amoxicillin], Lisinopril, Chocolate, Demerol, and Other    Review of Systems   Review of Systems  Constitutional:        Per HPI, otherwise negative  HENT:         Per HPI, otherwise negative  Respiratory:  Per HPI, otherwise negative  Cardiovascular:        Per HPI, otherwise negative  Gastrointestinal:  Negative for vomiting.  Endocrine:       Negative aside from HPI  Genitourinary:        Neg aside from HPI   Musculoskeletal:        Per HPI, otherwise negative  Skin: Negative.   Neurological:  Negative for syncope.   Physical Exam Updated Vital Signs BP (!) 164/97 (BP Location: Left Arm)    Pulse (!) 57    Temp 97.8 F (36.6 C) (Oral)    Resp 18    SpO2 96%  Physical Exam Vitals and nursing note reviewed.  Constitutional:      General: She is not in acute distress.    Appearance: She is well-developed. She is obese. She is not ill-appearing or diaphoretic.  HENT:     Head: Normocephalic and atraumatic.  Eyes:     Conjunctiva/sclera: Conjunctivae normal.  Cardiovascular:     Rate and Rhythm: Normal rate and regular rhythm.  Pulmonary:     Effort: Pulmonary effort is normal. No respiratory distress.     Breath sounds: Normal breath sounds. No stridor.  Abdominal:     General: There is no distension.  Musculoskeletal:     Comments: Patient can flex and  extend both the right knee and right hip, though with pain with motion.  Tenderness palpation throughout the right knee, patient notes this is unchanged from baseline.  Right hip tender laterally, posteriorly, no deformity appreciated.  Left hip, knee, ankle unremarkable.  Skin:    General: Skin is warm and dry.  Neurological:     Mental Status: She is alert and oriented to person, place, and time.     Cranial Nerves: No cranial nerve deficit.    ED Results / Procedures / Treatments   Labs (all labs ordered are listed, but only abnormal results are displayed) Labs Reviewed  BASIC METABOLIC PANEL - Abnormal; Notable for the following components:      Result Value   Sodium 133 (*)    Glucose, Bld 126 (*)    BUN 29 (*)    Creatinine, Ser 1.52 (*)    GFR, Estimated 35 (*)    All other components within normal limits  RESP PANEL BY RT-PCR (FLU A&B, COVID) ARPGX2  CBC WITH DIFFERENTIAL/PLATELET  DIGOXIN LEVEL  URINALYSIS, ROUTINE W REFLEX MICROSCOPIC    EKG None  Radiology DG Knee Complete 4 Views Right  Result Date: 02/18/2021 CLINICAL DATA:  Post fall, now with right knee pain. EXAM: RIGHT KNEE - COMPLETE 4+ VIEW COMPARISON:  None. FINDINGS: No fracture or dislocation. No significant knee joint effusion. Moderate to severe tricompartmental degenerative change of the knee, worse within the lateral compartment with joint space loss, subchondral sclerosis, articular surface irregularity and osteophytosis. No evidence of chondrocalcinosis. Dermal calcification noted anterior to the patellar tendon. Regional soft tissues appear otherwise normal. No radiopaque foreign body. IMPRESSION: 1. No acute findings. 2. Moderate to severe tricompartmental degenerative change of the knee, worse within the lateral compartment. Electronically Signed   By: Sandi Mariscal M.D.   On: 02/18/2021 12:52   DG Hip Unilat With Pelvis 2-3 Views Right  Result Date: 02/18/2021 CLINICAL DATA:  Post fall, now with right  hip pain. EXAM: DG HIP (WITH OR WITHOUT PELVIS) 2-3V RIGHT COMPARISON:  None. FINDINGS: No fracture or dislocation. Mild-to-moderate degenerative change of the right hip with joint space loss, subchondral sclerosis  and osteophytosis. No evidence of avascular necrosis. Limited visualization of the pelvis is normal. Similar mild-to-moderate degenerative change is suspected within the contralateral left hip, incompletely evaluated. Degenerative change the lower lumbar spine is suspected though incompletely evaluated. Note is made of right-sided gluteal calcifications. Vascular calcifications overlie the lower pelvis bilaterally. No radiopaque foreign body. IMPRESSION: 1. No acute findings. 2. Mild-to-moderate degenerative change of the right hip. Electronically Signed   By: Sandi Mariscal M.D.   On: 02/18/2021 12:51    Procedures Procedures    Medications Ordered in ED Medications  acetaminophen (TYLENOL) tablet 325 mg (has no administration in time range)  aspirin EC tablet 81 mg (has no administration in time range)  digoxin (LANOXIN) tablet 125 mcg (has no administration in time range)  docusate sodium (COLACE) capsule 100 mg (has no administration in time range)  DULoxetine (CYMBALTA) DR capsule 60 mg (has no administration in time range)  ezetimibe (ZETIA) tablet 10 mg (has no administration in time range)  insulin glargine (LANTUS) Solostar Pen 26 Units (has no administration in time range)  linaclotide (LINZESS) capsule 145 mcg (has no administration in time range)  meloxicam (MOBIC) tablet 15 mg (has no administration in time range)  metoprolol succinate (TOPROL-XL) 24 hr tablet 25 mg (has no administration in time range)  mirabegron ER (MYRBETRIQ) tablet 50 mg (has no administration in time range)  pantoprazole (PROTONIX) EC tablet 40 mg (has no administration in time range)  pregabalin (LYRICA) capsule 75 mg (has no administration in time range)  ramipril (ALTACE) capsule 10 mg (has no  administration in time range)  rOPINIRole (REQUIP) tablet 1 mg (has no administration in time range)  rosuvastatin (CRESTOR) tablet 10 mg (has no administration in time range)  sulfamethoxazole-trimethoprim (BACTRIM DS) 800-160 MG per tablet 1 tablet (has no administration in time range)  verapamil (CALAN) tablet 120 mg (has no administration in time range)  verapamil (VERELAN PM) 24 hr capsule CP24 240 mg (has no administration in time range)    ED Course/ Medical Decision Making/ A&P                           Elderly female presenting after fall with pain to multiple areas differential including posttraumatic injuries as well as contributing to her fall such as dehydration, infection, stroke considered.  Update: Patient accompanied by her daughter.  Daughter notes the patient has recent diagnosis of urinary tract infection, seemingly taking her medication as directed, though bacteremia remains on the differential. I reviewed the x-rays, interpreted myself, discussed them with the patient and her daughter, as well as demonstrated the images.  No evidence for fracture of the hip, nor knee.  Daughter notes the patient has had at least 3 falls recently, has been seen and evaluated in the ED twice, and she is concerned for the patient's safety at home.  Patient requests assistance with nursing home placement.  Remaining labs reviewed, discussed, no evidence for substantial abnormalities, though she does have urinary tract infection, taking her antibiotics, no evidence of bacteremia, sepsis.  With concern for safety, stability, social work has been consulted. Final Clinical Impression(s) / ED Diagnoses Final diagnoses:  Fall, initial encounter  Acute pain of right hip     Carmin Muskrat, MD 02/18/21 [phone]

## 2021-02-18 NOTE — ED Provider Notes (Signed)
Patient and her family do not wish to wait in the emergency room waiting for placement.  They are requesting discharge paperwork.   Leah Dessert, MD 02/18/21 [phone]

## 2021-02-18 NOTE — ED Triage Notes (Signed)
Pt bib [facility] from home s/p fall. Pt had a mechanical fall yesterday however refused transport at that time.  Per EMS today pt c/o right hip pain as well as limited movement.    EMS VS 160/90 63 18 95% CBG 153

## 2021-02-18 NOTE — Telephone Encounter (Signed)
Patient's daughter called because patient has fallen multiple times this week and she was taken to ED and they said that she may be exhausting her at home resources. Patient fell yesterday  01/05, but fell forward and scraped her hand and leg. Fire department came and helped with patient. Daughter states that patient had forgotten that she had a caregiver to help her and had gotten up by herself. While they are trying to get patient into assisted living, the wait list is at least six months out. Daughter is wanting some advice on where to go from here and ways to help over the weekend. They are scheduled for Monday. She requested message be sent back and she be sent to triage to speak with them as well.     Good callback number is [phone]  Please advise

## 2021-02-18 NOTE — Telephone Encounter (Signed)
We could try PT for fall prevention. Thanks, BJ

## 2021-02-18 NOTE — Telephone Encounter (Signed)
Patient is at the ED  

## 2021-02-18 NOTE — Discharge Instructions (Addendum)
Physical therapy was ordered for you at your home.  Also call you doctor on Monday who may also be able to help you with care.

## 2021-02-18 NOTE — Telephone Encounter (Signed)
Has appt on Monday

## 2021-02-21 ENCOUNTER — Other Ambulatory Visit: Payer: Self-pay

## 2021-02-21 ENCOUNTER — Telehealth: Payer: Self-pay | Admitting: Pharmacist

## 2021-02-21 ENCOUNTER — Telehealth (INDEPENDENT_AMBULATORY_CARE_PROVIDER_SITE_OTHER): Payer: HMO | Admitting: Family Medicine

## 2021-02-21 ENCOUNTER — Encounter: Payer: Self-pay | Admitting: Family Medicine

## 2021-02-21 ENCOUNTER — Ambulatory Visit: Payer: PPO | Admitting: *Deleted

## 2021-02-21 VITALS — Ht 63.0 in

## 2021-02-21 DIAGNOSIS — R296 Repeated falls: Secondary | ICD-10-CM | POA: Diagnosis not present

## 2021-02-21 DIAGNOSIS — I471 Supraventricular tachycardia: Secondary | ICD-10-CM

## 2021-02-21 DIAGNOSIS — R2681 Unsteadiness on feet: Secondary | ICD-10-CM

## 2021-02-21 DIAGNOSIS — M159 Polyosteoarthritis, unspecified: Secondary | ICD-10-CM

## 2021-02-21 DIAGNOSIS — R5383 Other fatigue: Secondary | ICD-10-CM

## 2021-02-21 DIAGNOSIS — E1140 Type 2 diabetes mellitus with diabetic neuropathy, unspecified: Secondary | ICD-10-CM

## 2021-02-21 DIAGNOSIS — K5900 Constipation, unspecified: Secondary | ICD-10-CM

## 2021-02-21 DIAGNOSIS — M797 Fibromyalgia: Secondary | ICD-10-CM

## 2021-02-21 DIAGNOSIS — Z794 Long term (current) use of insulin: Secondary | ICD-10-CM

## 2021-02-21 DIAGNOSIS — M4714 Other spondylosis with myelopathy, thoracic region: Secondary | ICD-10-CM

## 2021-02-21 DIAGNOSIS — I10 Essential (primary) hypertension: Secondary | ICD-10-CM

## 2021-02-21 DIAGNOSIS — R531 Weakness: Secondary | ICD-10-CM

## 2021-02-21 DIAGNOSIS — E119 Type 2 diabetes mellitus without complications: Secondary | ICD-10-CM

## 2021-02-21 NOTE — Patient Instructions (Signed)
Visit Information  Thank you for taking time to visit with me today. Please don't hesitate to contact me if I can be of assistance to you before our next scheduled telephone appointment.  Following are the goals we discussed today:  Patient Goals/Self-Care Activities:  Daughter, Capria Cartaya [phone]; e-mail: [email or web address]) has agreed to work with LCSW on a bi-weekly basis, in an effort to coordinate long-term care placement for you.  LCSW collaboration with Primary Care Physician, Dr. Betty [name] to request completion of updated FL-2 Form, and fax directly to LCSW (# [phone]). LCSW collaboration with Deirdre Pippins, Admissions Coordinator at [facility] [phone]), to confirm female, private pay bed availability.     ~LCSW will fax updated FL-2 Form to Deirdre Pippins, once received, to try and pursue a bed offer. LCSW collaboration with Elyse Hsu, Admissions Coordinator at [facility] [phone]), to confirm female, private pay bed availability.  ~LCSW will fax updated FL-2 Form to Elyse Hsu, once received, to try and pursue a bed offer. LCSW collaboration with Primary Care Physician, Dr. Betty [name] to confirm orders placed for home health nursing and physical therapy services. Contact LCSW directly (# [phone]) if you have questions, need assistance, or if additional social work needs are identified between now and our next scheduled telephone outreach call.   Follow-Up Plan:  LCSW will make contact with patient on 02/24/2021 at 1:30 pm.  Please call the care guide team at [phone] if you need to cancel or reschedule your appointment.   If you are experiencing a Mental Health or [place] or need someone to talk to, please call the Suicide and Crisis Lifeline: 988 call the [place] National Suicide Prevention Lifeline: 220-198-0038 or TTY: 225-542-1499 TTY 323-334-4460) to talk to a  trained counselor call 1-800-273-TALK (toll free, 24 hour hotline) go to [facility] [address] [place] [phone]) call the [place]: [phone] call 911   Patient verbalizes understanding of instructions provided today and agrees to view in [place].   Nat Christen LCSW Licensed Clinical Social Worker [place] [phone]

## 2021-02-21 NOTE — Chronic Care Management (AMB) (Signed)
° ° °  Chronic Care Management Pharmacy Assistant   Name: [name]  MRN: [id number] DOB: 09-Apr-1940  02/23/2021 APPOINTMENT REMINDER  Called [name], No answer, left message of appointment on 02/23/2021 at 1:45 via telephone visit with Jeni Salles, Pharm D. Notified to have all medications, supplements, blood pressure and/or blood sugar logs available during appointment and to return call if need to reschedule.  Care Gaps: AWV - scheduled for 11/29/2021 Ophthalmology exam - overdue Covid vaccine - overdue Foot exam - overdue Last BP - 124/80 on 01/26/2021 Last A1C - 8.3 on 01/26/2021  Star Rating Drug: Ramipril 10mg  - last filled on 01/17/2021 [facility] Rosuvastatin 10mg  - last filled on 09/27/20 [facility] Verapamil 120mg  - last filled on 01/28/2021 [facility] at [facility]  Any gaps in medications fill history? No  Gennie Alma [facility]  Catering manager [phone]

## 2021-02-21 NOTE — Progress Notes (Signed)
Virtual Visit via Video Note I connected with [name] on 02/21/21 by a video enabled telemedicine application and verified that I am speaking with the correct person using two identifiers.  Location patient: home Location provider:work office.  Persons participating in the virtual visit: patient, daughter [name]) ,provider [name] was able to see me but I was not.  I discussed the limitations of evaluation and management by telemedicine and the availability of in person appointments. The patient expressed understanding and agreed to proceed.  Chief Complaint  Patient presents with   discuss mobility and falls   HPI: [name] is a 81 yo female with hx of HTN,DM II,fibromyalgia, generalized OA, and depression following on recent ED visit. Evaluated in the ED on 02/18/21 because fall at home on 02/17/2021, evaluated by EMS at home. Next day she presented to the ER because worsening pain of right hip and knee. Right knee and hip X ray were negative for acute process. Degenerative changes, mild to moderate in right hip and moderate to severe in right knee. Pain is moderate, she is taking Tylenol, which helps some. Pain is exacerbated by movement. Follows with pain management.  Unstable gait and OA pain are getting worse. According to her daughter,for the past week she has been bedridden. Ambulation aggravates pain and she is afraid of falling, her aid cannot help her to get up when she falls. Last fall happened when she was trying to move from bed to chair. She uses a walker. She has had 8 falls since her last visit.  She is on Meloxicam 15 mg daily and Lyrica 75 mg tid. She would like a percocet for pain.  Also having problems with constipation, before last fall she did not have a  bowel movement for a week. She took Linzess 145 mcg , which was prescribed to take after knee surgery. Medication helped, so she would like to continue taking it daily. No side effects. Negative for abdominal  pain,nausea,vomiting, blood in stool, or urinary symptoms. Recently treated for UTI, 02/15/21.   Her daughters are not longer able to continue care of [name] at home, so they are looking into assisted living. [name] has been planning on moving to independent or assisting living for a few months now but has not found a place with enough room space to move her own bed.  Her daughter would like a FL-2 from to be completed and faxed to [facility] assisted living and [facility].  ROS: See pertinent positives and negatives per HPI.  Past Medical History:  Diagnosis Date   Anemia    during 1st pregnancy   Anxiety    Arthritis    Cancer ([place])    Basal - face   Complication of anesthesia    Depression    Diarrhea, functional    Dysrhythmia    Paroysmal Atrial Tachycardia   Essential hypertension    Fibromyalgia    GERD (gastroesophageal reflux disease)    H/O acute pancreatitis    Headache    History of hiatal hernia    Hyperlipemia    Neuropathy    Osteoporosis    Panic attacks    Paralysis ([place]) 01/06/2019   hands   PAT (paroxysmal atrial tachycardia) (HCC)    Pneumonia    PONV (postoperative nausea and vomiting)    PSVT (paroxysmal supraventricular tachycardia) (HCC)    Reflux    Restless leg syndrome    Spinal cord compression ([place])  Type 2 diabetes mellitus (HCC)    Type II   Vertigo    not current    Past Surgical History:  Procedure Laterality Date   ABDOMINAL HYSTERECTOMY     ANKLE SURGERY Left    x 2   ANTERIOR CERVICAL DECOMP/DISCECTOMY FUSION N/A 11/12/2017   Procedure: Cervical seven to Thoracic one  Anterior cervical decompression/discectomy/fusion with removal of old plate;  Surgeon: [name], MD;  Location: [place];  Service: Neurosurgery;  Laterality: N/A;   BACK SURGERY     T12-L1 fusion, Anterior Cervical fusion   CARDIAC CATHETERIZATION N/A 02/16/2015   Procedure: Left Heart Cath and Coronary Angiography;   Surgeon: Sherren Mocha, MD;  Location: [facility];  Service: Cardiovascular;  Laterality: N/A;   CHOLECYSTECTOMY     COLONOSCOPY     ESOPHAGOGASTRODUODENOSCOPY  12/21/2010   Procedure: ESOPHAGOGASTRODUODENOSCOPY (EGD);  Surgeon: Landry Dyke, MD;  Location: [facility];  Service: Endoscopy;  Laterality: N/A;   EXCISION MORTON'S NEUROMA Right    EYE SURGERY Bilateral    Cataract   FRACTURE SURGERY     ankle x 2    KNEE SURGERY Right    x 3- arthroscopy   NECK SURGERY     POSTERIOR CERVICAL FUSION/FORAMINOTOMY N/A 01/22/2018   Procedure: Cervical six-seven Posterior Decompression with Posterior Fixation Cervical Five to Thoracic Two;  Surgeon: [name], MD;  Location: [place];  Service: Neurosurgery;  Laterality: N/A;  Cervical 6-7 Posteror decompression with posterior fixation Cervical 5 to Thoracic 2   ROTATOR CUFF REPAIR Right    TONSILLECTOMY     TOTAL KNEE ARTHROPLASTY Left 04/10/2017   Procedure: LEFT TOTAL KNEE ARTHROPLASTY;  Surgeon: Paralee Cancel, MD;  Location: [facility];  Service: Orthopedics;  Laterality: Left;  70 mins   UPPER GASTROINTESTINAL ENDOSCOPY     WRIST SURGERY Left    fracture    Family History  Problem Relation Age of Onset   Heart failure Mother    Hyperlipidemia Mother    Lung cancer Father        Lymphoma   Hyperlipidemia Maternal Grandmother    Peripheral vascular disease Maternal Grandmother     Social History   Socioeconomic History   Marital status: Widowed    Spouse name: [name]   Number of children: 2   Years of education: 12   Highest education level: 12th grade  Occupational History   Occupation: Retired  Tobacco Use   Smoking status: Former    Types: Cigarettes    Passive exposure: Past   Smokeless tobacco: Never   Tobacco comments:    smoke 1 cigarette every few weeks when the [place] lady came by.  Vaping Use   Vaping Use: Never used  Substance and Sexual Activity   Alcohol use: No   Drug use: No   Sexual  activity: Not Currently  Other Topics Concern   Not on file  Social History Narrative   Lives home alone.  Daughter [name] with her today.   Independent of ADLs.  Education HS.  Widowed.     Social Determinants of Health   Financial Resource Strain: Low Risk    Difficulty of Paying Living Expenses: Not hard at all  Food Insecurity: No Food Insecurity   Worried About Charity fundraiser in the Last Year: Never true   [place] in the Last Year: Never true  Transportation Needs: No Transportation Needs   Lack of Transportation (Medical): No   Lack  of Transportation (Non-Medical): No  Physical Activity: Insufficiently Active   Days of Exercise per Week: 5 days   Minutes of Exercise per Session: 20 min  Stress: No Stress Concern Present   Feeling of Stress : Only a little  Social Connections: Moderately Isolated   Frequency of Communication with Friends and Family: More than three times a week   Frequency of Social Gatherings with Friends and Family: More than three times a week   Attends Religious Services: 1 to 4 times per year   Active Member of [employer] or Organizations: No   Attends Archivist Meetings: Never   Marital Status: Widowed  Human resources officer Violence: Not At Risk   Fear of Current or Ex-Partner: No   Emotionally Abused: No   Physically Abused: No   Sexually Abused: No    Current Outpatient Medications:    acetaminophen (TYLENOL) 325 MG tablet, Take 325 mg by mouth every 6 (six) hours as needed., Disp: , Rfl:    acetaminophen (TYLENOL) 500 MG tablet, Take 500 mg by mouth every 6 (six) hours as needed for mild pain, fever or headache., Disp: , Rfl:    aspirin EC 81 MG tablet, Take 81 mg by mouth at bedtime. , Disp: , Rfl:    Calcium Carb-Cholecalciferol 600-200 MG-UNIT TABS, Take 1 tablet by mouth in the morning and at bedtime. , Disp: , Rfl:    Cholecalciferol (DIALYVITE VITAMIN D 5000) 125 MCG (5000 UT) capsule, Take 5,000-10,000 Units by mouth See  admin instructions. 10,000 units in the morning 5,000 units in the evening, Disp: , Rfl:    Continuous Blood Gluc Receiver (DEXCOM G6 RECEIVER) DEVI, 1 Device by Does not apply route daily., Disp: 1 Device, Rfl: 0   Continuous Blood Gluc Sensor (DEXCOM G6 SENSOR) MISC, 1 kit by Does not apply route as directed., Disp: 3 each, Rfl: 3   Continuous Blood Gluc Transmit (DEXCOM G6 TRANSMITTER) MISC, 1 kit by Does not apply route as directed., Disp: 1 each, Rfl: 3   digoxin (LANOXIN) 0.125 MG tablet, TAKE 1 TABLET BY MOUTH DAILY (Patient taking differently: Take 0.125 mg by mouth daily.), Disp: 90 tablet, Rfl: 0   docusate sodium (COLACE) 100 MG capsule, Take 100 mg by mouth 2 (two) times daily., Disp: , Rfl:    DULoxetine (CYMBALTA) 30 MG capsule, TAKE 2 CAPSULES BY MOUTH EVERY DAY (Patient taking differently: 60 mg every evening.), Disp: 180 capsule, Rfl: 2   ezetimibe (ZETIA) 10 MG tablet, TAKE 1 TABLET BY MOUTH EVERY DAY (Patient taking differently: Take 10 mg by mouth daily.), Disp: 90 tablet, Rfl: 2   insulin aspart (NOVOLOG FLEXPEN) 100 UNIT/ML FlexPen, Max daily 75 units per correction scale (Patient taking differently: Inject 12-16 Units into the skin 3 (three) times daily with meals. 12 units in the morning and midday 16 units in the evening), Disp: 75 mL, Rfl: 3   insulin glargine (LANTUS SOLOSTAR) 100 UNIT/ML Solostar Pen, Inject 26 Units into the skin daily. (Patient taking differently: Inject 24 Units into the skin daily.), Disp: 30 mL, Rfl: 3   Insulin Pen Needle (BD PEN NEEDLE MICRO U/F) 32G X 6 MM MISC, Inject 1 Device into the skin QID. USE 4 TIMES A DAY NOVOLOG(3) AND LANTUS9(1), Disp: 400 each, Rfl: 3   linaclotide (LINZESS) 145 MCG CAPS capsule, Take 1 capsule (145 mcg total) by mouth daily before breakfast., Disp: 30 capsule, Rfl: 1   meloxicam (MOBIC) 15 MG tablet, TAKE 1 TABLET (15 MG TOTAL)  BY MOUTH DAILY. (Patient taking differently: Take 15 mg by mouth daily.), Disp: 30 tablet, Rfl:  1   Menthol, Topical Analgesic, (BLUE-EMU MAXIMUM STRENGTH EX), Apply 1 application topically as needed (pain)., Disp: , Rfl:    metoprolol succinate (TOPROL-XL) 25 MG 24 hr tablet, TAKE 1 TABLET BY MOUTH DAILY. PLEASE MAKE OVERDUE APPT WITH DR. Marlou Porch BEFORE ANYMORE REFILLS. (Patient taking differently: Take 25 mg by mouth daily.), Disp: 30 tablet, Rfl: 12   Multiple Vitamins-Minerals (PRESERVISION AREDS 2 PO), Take 1 tablet by mouth 2 (two) times daily. , Disp: , Rfl:    MYRBETRIQ 50 MG TB24 tablet, TAKE 1 TABLET BY MOUTH EVERY DAY (Patient taking differently: Take 50 mg by mouth daily.), Disp: 90 tablet, Rfl: 1   omega-3 acid ethyl esters (LOVAZA) 1 g capsule, Take 1 capsule (1 g total) by mouth 2 (two) times daily., Disp: 180 capsule, Rfl: 2   omeprazole (PRILOSEC) 20 MG capsule, Take 1 capsule (20 mg total) by mouth daily., Disp: 90 capsule, Rfl: 2   PFIZER-BIONT COVID-19 VAC-TRIS SUSP injection, , Disp: , Rfl:    polyethylene glycol (MIRALAX / GLYCOLAX) 17 g packet, Take 17 g by mouth daily as needed for mild constipation., Disp: , Rfl:    pregabalin (LYRICA) 75 MG capsule, TAKE 1 CAPSULE BY MOUTH 3 TIMES DAILY. (Patient taking differently: Take 75 mg by mouth 3 (three) times daily.), Disp: 90 capsule, Rfl: 2   ramipril (ALTACE) 10 MG capsule, TAKE 1 CAPSULE BY MOUTH EVERY EVENING. (Patient taking differently: Take 10 mg by mouth every evening.), Disp: 90 capsule, Rfl: 2   RELION INSULIN SYRINGE 1ML/31G 31G X 5/16" 1 ML MISC, Use as directed, Disp: 100 each, Rfl: 1   rOPINIRole (REQUIP) 1 MG tablet, TAKE 1 TABLET BY MOUTH EVERYDAY AT BEDTIME (Patient taking differently: Take 1 mg by mouth at bedtime.), Disp: 90 tablet, Rfl: 1   rosuvastatin (CRESTOR) 10 MG tablet, TAKE 1 TABLET BY MOUTH EVERY DAY (Patient taking differently: Take 10 mg by mouth daily.), Disp: 90 tablet, Rfl: 1   verapamil (CALAN) 120 MG tablet, Take 1 tablet (120 mg total) by mouth daily as needed (afib)., Disp: 30 tablet, Rfl:  1   verapamil (VERELAN PM) 240 MG 24 hr capsule, TAKE 1 CAPSULE BY MOUTH EVERY DAY (Patient taking differently: Take 240 mg by mouth daily.), Disp: 90 capsule, Rfl: 3   vitamin B-12 (CYANOCOBALAMIN) 1000 MCG tablet, Take 1,000 mcg by mouth daily., Disp: , Rfl:   Observations/Objective: Patient does not sound in acute distress. I do not appreciate any SOB,cough,wheezing,or stridor. Speech and thought processing are grossly intact.+ Anxious. Patient reported vitals:Ht _0  (1.6 m)    BMI 35.96 kg/m   ASSESSMENT AND PLAN:  Discussed the following assessment and plan:  Frequent falls - Plan: Ambulatory referral to [facility] Some of her chronic medical conditions like generalized OA and fibromyalgia are the main exacerbating factor. Some of the medications she is taking can also increase the risk for falls. PT will help, it will be arranged through [facility] and continue once she moves with assisted living. We discussed the option of a motorized wheel chair but her caregivers cannot help her to transfer from bed to chair, so at this time this will not help.  Constipation, unspecified constipation type Linzess 145 mcg helped, recommend taking medication every other day. Adequate fiber and fluid intake. Instructed about warning signs.  Generalized osteoarthritis of multiple sites - Plan: Ambulatory referral to Home Health Problem is  getting worse. She is established with pain management, recommended calling provider and discuss treatment options. We discussed some side effects of opioid medications, including worsening constipation and increasing falling risk. Continue Meloxicam 15 mg daily prn.  FL-2 forms will be completed and faxed to [facility] assisted living and [facility] as requested. Her daughter will call her social worker today.  We discussed possible serious and likely etiologies, options for evaluation and workup, limitations of telemedicine  visit vs in person visit, treatment, treatment risks and precautions.  I discussed the assessment and treatment plan with the patient. The patient and her daughter were provided an opportunity to ask questions and all were answered. They agreed with the plan and demonstrated an understanding of the instructions.I did not refer this patient for an OV in the next 24 hours for this/these issue(s).  I provided 22 minutes of non-face-to-face time during this encounter.  Return if symptoms worsen or fail to improve, for Keep next appt..  Caylen Yardley G. [name], MD  [facility]. [place] office.

## 2021-02-21 NOTE — Chronic Care Management (AMB) (Signed)
Chronic Care Management    Clinical Social Work Note  02/21/2021 Name: [name] MRN: [id number] DOB: December 18, 1940  [name] is a 81 y.o. year old female who is a primary care patient of [name], Malka So, MD. The CCM team was consulted to assist the patient with chronic disease management and/or care coordination needs related to: [employer], Level of Care Concerns, and Caregiver Stress.   Engaged with patient and daughter by telephone for follow up visit in response to provider referral for social work chronic care management and care coordination services.   Consent to Services:  The patient was given information about Chronic Care Management services, agreed to services, and gave verbal consent prior to initiation of services.  Please see initial visit note for detailed documentation.   Patient agreed to services and consent obtained.   Assessment: Review of patient past medical history, allergies, medications, and health status, including review of relevant consultants reports was performed today as part of a comprehensive evaluation and provision of chronic care management and care coordination services.     SDOH (Social Determinants of Health) assessments and interventions performed:    Advanced Directives Status: Not addressed in this encounter.  CCM Care Plan  Allergies  Allergen Reactions   Amoxil [Amoxicillin] Swelling and Other (See Comments)    Lips Has patient had a PCN reaction causing immediate rash, facial/tongue/throat swelling, SOB or lightheadedness with hypotension:YES Has patient had a PCN reaction causing severe rash involving mucus membranes or skin necrosis:No Has patient had a PCN reaction that required hospitalization:No Has patient had a PCN reaction occurring within the last 10 years:Yes. If all of the above answers are "NO", then may proceed with Cephalosporin use.    Lisinopril Itching and Swelling    SWELLING REACTION UNSPECIFIED     Chocolate Hives   Demerol Nausea And Vomiting   Other Other (See Comments)    Raw food or nuts - GI pain    Outpatient Encounter Medications as of 02/21/2021  Medication Sig   acetaminophen (TYLENOL) 325 MG tablet Take 325 mg by mouth every 6 (six) hours as needed.   acetaminophen (TYLENOL) 500 MG tablet Take 500 mg by mouth every 6 (six) hours as needed for mild pain, fever or headache.   aspirin EC 81 MG tablet Take 81 mg by mouth at bedtime.    Calcium Carb-Cholecalciferol 600-200 MG-UNIT TABS Take 1 tablet by mouth in the morning and at bedtime.    Cholecalciferol (DIALYVITE VITAMIN D 5000) 125 MCG (5000 UT) capsule Take 5,000-10,000 Units by mouth See admin instructions. 10,000 units in the morning 5,000 units in the evening   Continuous Blood Gluc Receiver (DEXCOM G6 RECEIVER) DEVI 1 Device by Does not apply route daily.   Continuous Blood Gluc Sensor (DEXCOM G6 SENSOR) MISC 1 kit by Does not apply route as directed.   Continuous Blood Gluc Transmit (DEXCOM G6 TRANSMITTER) MISC 1 kit by Does not apply route as directed.   digoxin (LANOXIN) 0.125 MG tablet TAKE 1 TABLET BY MOUTH DAILY (Patient taking differently: Take 0.125 mg by mouth daily.)   docusate sodium (COLACE) 100 MG capsule Take 100 mg by mouth 2 (two) times daily.   DULoxetine (CYMBALTA) 30 MG capsule TAKE 2 CAPSULES BY MOUTH EVERY DAY (Patient taking differently: 60 mg every evening.)   ezetimibe (ZETIA) 10 MG tablet TAKE 1 TABLET BY MOUTH EVERY DAY (Patient taking differently: Take 10 mg by mouth daily.)   insulin aspart (NOVOLOG FLEXPEN) 100  UNIT/ML FlexPen Max daily 75 units per correction scale (Patient taking differently: Inject 12-16 Units into the skin 3 (three) times daily with meals. 12 units in the morning and midday 16 units in the evening)   insulin glargine (LANTUS SOLOSTAR) 100 UNIT/ML Solostar Pen Inject 26 Units into the skin daily. (Patient taking differently: Inject 24 Units into the skin daily.)   Insulin  Pen Needle (BD PEN NEEDLE MICRO U/F) 32G X 6 MM MISC Inject 1 Device into the skin QID. USE 4 TIMES A DAY NOVOLOG(3) AND LANTUS9(1)   linaclotide (LINZESS) 145 MCG CAPS capsule Take 1 capsule (145 mcg total) by mouth daily before breakfast.   meloxicam (MOBIC) 15 MG tablet TAKE 1 TABLET (15 MG TOTAL) BY MOUTH DAILY. (Patient taking differently: Take 15 mg by mouth daily.)   Menthol, Topical Analgesic, (BLUE-EMU MAXIMUM STRENGTH EX) Apply 1 application topically as needed (pain).   metoprolol succinate (TOPROL-XL) 25 MG 24 hr tablet TAKE 1 TABLET BY MOUTH DAILY. PLEASE MAKE OVERDUE APPT WITH DR. Marlou Porch BEFORE ANYMORE REFILLS. (Patient taking differently: Take 25 mg by mouth daily.)   Multiple Vitamins-Minerals (PRESERVISION AREDS 2 PO) Take 1 tablet by mouth 2 (two) times daily.    MYRBETRIQ 50 MG TB24 tablet TAKE 1 TABLET BY MOUTH EVERY DAY (Patient taking differently: Take 50 mg by mouth daily.)   omega-3 acid ethyl esters (LOVAZA) 1 g capsule Take 1 capsule (1 g total) by mouth 2 (two) times daily.   omeprazole (PRILOSEC) 20 MG capsule Take 1 capsule (20 mg total) by mouth daily.   PFIZER-BIONT COVID-19 VAC-TRIS SUSP injection    polyethylene glycol (MIRALAX / GLYCOLAX) 17 g packet Take 17 g by mouth daily as needed for mild constipation.   pregabalin (LYRICA) 75 MG capsule TAKE 1 CAPSULE BY MOUTH 3 TIMES DAILY. (Patient taking differently: Take 75 mg by mouth 3 (three) times daily.)   ramipril (ALTACE) 10 MG capsule TAKE 1 CAPSULE BY MOUTH EVERY EVENING. (Patient taking differently: Take 10 mg by mouth every evening.)   RELION INSULIN SYRINGE 1ML/31G 31G X 5/16" 1 ML MISC Use as directed   rOPINIRole (REQUIP) 1 MG tablet TAKE 1 TABLET BY MOUTH EVERYDAY AT BEDTIME (Patient taking differently: Take 1 mg by mouth at bedtime.)   rosuvastatin (CRESTOR) 10 MG tablet TAKE 1 TABLET BY MOUTH EVERY DAY (Patient taking differently: Take 10 mg by mouth daily.)   verapamil (CALAN) 120 MG tablet Take 1 tablet  (120 mg total) by mouth daily as needed (afib).   verapamil (VERELAN PM) 240 MG 24 hr capsule TAKE 1 CAPSULE BY MOUTH EVERY DAY (Patient taking differently: Take 240 mg by mouth daily.)   vitamin B-12 (CYANOCOBALAMIN) 1000 MCG tablet Take 1,000 mcg by mouth daily.   No facility-administered encounter medications on file as of 02/21/2021.    Patient Active Problem List   Diagnosis Date Noted   Type 2 diabetes mellitus with hyperglycemia, with long-term current use of insulin ([place]) 12/02/2020   Coronary artery calcification 11/23/2020   Anxiety disorder 11/19/2020   Aortic atherosclerosis ([place]) 09/20/2019   Hyperlipidemia associated with type 2 diabetes mellitus ([place]) 09/17/2019   Mild recurrent major depression ([place]) 09/17/2019   Type 2 diabetes mellitus with diabetic polyneuropathy, with long-term current use of insulin ([place]) 09/04/2019   Fatigue 09/03/2019   Dysuria 09/03/2019   Peripheral neuropathy 02/28/2019   Thoracic myelopathy 01/08/2019   Cervical spine fracture ([place]) 01/22/2018   Weakness 01/21/2018   Closed C7 fracture ([place]) 01/21/2018  Fall    Lower urinary tract infectious disease    Cervical spondylosis with myelopathy 11/12/2017   Tinnitus of both ears 10/19/2017   Rhinorrhea 09/26/2017   Chronic diarrhea 08/08/2017   S/P left TKA 04/10/2017   Unstable gait 01/12/2017   Generalized osteoarthritis of hand 01/12/2017   RLS (restless legs syndrome) 11/09/2016   GERD (gastroesophageal reflux disease) 07/13/2016   Morbid obesity ([place]) 06/01/2016   Vitamin D deficiency 06/01/2016   Vitamin B12 deficiency 03/27/2016   Chronic cough 03/16/2016   Left foot pain 03/16/2016   Degenerative drusen 07/13/2013   Type 2 diabetes mellitus without ophthalmic manifestations ([place]) 07/13/2013   DM (diabetes mellitus) type II controlled, neurological manifestation ([place]) 05/07/2012   SVT (supraventricular tachycardia) ([place]) 05/07/2012   Essential hypertension, benign 05/07/2012    Fibromyalgia 05/07/2012    Conditions to be addressed/monitored: HTN and DMII.  Level of Care Concerns, ADL/IADL Limitations, Limited Access to Caregiver, Cognitive Deficits, Memory Deficits, and Lacks Knowledge of [employer].  Care Plan : LCSW Plan of Care  Updates made by Francis Gaines, LCSW since 02/21/2021 12:00 AM     Problem: Improve My Quality of Life through [facility].   Priority: High     Long-Range Goal: Improve My Quality of Life through [facility].   Start Date: 12/06/2020  Expected End Date: 03/08/2021  This Visit's Progress: On track  Recent Progress: On track  Priority: High  Note:   Current Barriers:  Chronic Disease Management Support, Education, Resources, Referrals, Advocacy, and Case Management Service needs related to Mobility Issues, Fall Risk, Weakness, Fatigue, Morbid Obesity and Chronic Fibromyalgia Pain. Limited ability to perform activities of daily living independently and inability to self-administer medications as prescribed.  Inability to continue to receive 24 hour care and supervision, through private pay caregivers and both daughters.  Clinical Goal(s):  Patient and daughter will work with LCSW to identify and address any acute and/or chronic care coordination needs, as it relates to self-health management.   Patient and both daughters desire for patient to receive placement into a long-term care facility.   LCSW will submit appropriate paperwork to [facility] to try and obtain prior approval and authorization; however, patient is willing to private pay for at least 2 years. LCSW Interventions:  Inter-disciplinary care team collaboration (see longitudinal plan of care). Collaboration with Primary Care Physician, Dr. Betty [name] regarding development and update of comprehensive plan of care as evidenced by provider attestation and  co-signature. Collaboration with Primary Care Physician, Dr. Betty [name] to request completion of updated FL-2 Form. Collaboration with Primary Care Physician, Dr. Betty [name] to explain that patient has fallen 8 times within the last 30 days, per daughter's request. Collaboration with Primary Care Physician, Dr. Betty [name] to confirm orders placed for home health nursing and physical therapy services. Patient Goals/Self-Care Activities:  Daughter, Shaconda Hajduk [phone]; e-mail: [email or web address]) has agreed to work with LCSW on a bi-weekly basis, in an effort to coordinate long-term care placement for you.  LCSW collaboration with Primary Care Physician, Dr. Betty [name] to request completion of updated FL-2 Form, and fax directly to LCSW (# [phone]). LCSW collaboration with Deirdre Pippins, Admissions Coordinator at [facility] [phone]), to confirm female, private pay bed availability.     ~LCSW will fax updated FL-2 Form to Deirdre Pippins, once received, to try and pursue a bed offer. LCSW collaboration with Elyse Hsu, Admissions  Coordinator at [facility] [phone]), to confirm female, private pay bed availability.  ~LCSW will fax updated FL-2 Form to Elyse Hsu, once received, to try and pursue a bed offer. LCSW collaboration with Primary Care Physician, Dr. Betty [name] to confirm orders placed for home health nursing and physical therapy services. Contact LCSW directly (# [phone]) if you have questions, need assistance, or if additional social work needs are identified between now and our next scheduled telephone outreach call.   Follow-Up Plan:  LCSW will make contact with patient on 02/24/2021 at 1:30 pm.     Nat Christen LCSW Licensed Clinical Social Worker [place] [phone]

## 2021-02-23 ENCOUNTER — Ambulatory Visit (INDEPENDENT_AMBULATORY_CARE_PROVIDER_SITE_OTHER): Payer: HMO | Admitting: Pharmacist

## 2021-02-23 ENCOUNTER — Other Ambulatory Visit: Payer: Self-pay | Admitting: Physical Medicine and Rehabilitation

## 2021-02-23 ENCOUNTER — Encounter: Payer: Self-pay | Admitting: Physical Medicine and Rehabilitation

## 2021-02-23 DIAGNOSIS — Z1231 Encounter for screening mammogram for malignant neoplasm of breast: Secondary | ICD-10-CM | POA: Diagnosis not present

## 2021-02-23 DIAGNOSIS — E119 Type 2 diabetes mellitus without complications: Secondary | ICD-10-CM

## 2021-02-23 DIAGNOSIS — I10 Essential (primary) hypertension: Secondary | ICD-10-CM

## 2021-02-23 NOTE — Progress Notes (Signed)
Chronic Care Management Pharmacy Note  03/02/2021 Name:  [name] MRN:  [id number] DOB:  05/18/1940  Summary: BP is mostly at goal < 140/90 Pt is still having falls  Recommendations/Changes made from today's visit: -Recommended keeping a log and routine BP monitoring at home to prevent lows and falls given high fall risk  Plan: BP follow up in 2 months and assess if patient is placed in SNF  Subjective: [name] is an 81 y.o. year old female who is a primary patient of [name], Malka So, MD.  The CCM team was consulted for assistance with disease management and care coordination needs.    Engaged with patient by telephone for follow up visit in response to provider referral for pharmacy case management and/or care coordination services.   Consent to Services:  The patient was given information about Chronic Care Management services, agreed to services, and gave verbal consent prior to initiation of services.  Please see initial visit note for detailed documentation.   Patient Care Team: [name], [name], MD as PCP - General (Family Medicine) Jerline Pain, MD as PCP - Cardiology (Cardiology) Viona Gilmore, [facility] as Pharmacist (Pharmacist)  Recent office visits: 02/21/21 Betty [name], MD: Patient presented for video visit for frequent falls/mobility concerns. Recommended taking Linzess every other day. Referral placed for home health.  01/26/21 Betty [name], MD: Patient presented for surgery clearance. Prescribed Linzess.  12/31/20 Betty [name], MD: Patient presented for hearing loss. Cerumen was removed. D/c'd hydroxyzine as this caused drowsiness.  11/19/20 Betty [name], MD: Patient presented for anxiety. Prescribed hydroxyzine PRN.  11/16/20 Charlott Rakes, LPN: Patient presented for AWV.  Recent consult visits: 01/04/21 Ibtehal Shamleffer (endo): Patient presented for diabetes follow up. Recommended separating insulins by location in the house as patient is not  administering properly.  12/09/20 Leeroy Cha, MD (physical medicine and rehab): Patient presented for OA/neuropathy follow up. Provided Qutenza capsaicin patch.  11/30/20 Ibtehal Shamleffer (endo): Patient presented for diabetes follow up.  11/23/20 Candee Furbish, MD (cardiology): Patient presented for HTN follow up.  10/04/20 [name] (Physical Medicine and Rehabilitation) -  video visit. seen for primary osteoarthritis of right knee.no medication changes. No follow up noted.   09/06/20 [name] (Physical Medicine and Rehabilitation) - seen for primary osteoarthritis of right knee.no medication changes. No follow up noted.   09/02/20 [name] (Physical Medicine and Rehabilitation) - video visit. Seen for primary osteoarthritis. Patient started on methylprednisolone 4mg  taper pak. No follow up noted.  Hospital visits: 02/18/21 [facility] ED: Patient presented for fall and pain in right hip.  02/15/21 [facility] ED: Patient presented to ED for general weakness and UTI. Prescribed Bactrim DS.  12/24/20 [facility] ED: Patient presented for hyperglycemia.   Objective:  Lab Results  Component Value Date   CREATININE 1.52 (H) 02/18/2021   BUN 29 (H) 02/18/2021   GFR 42.53 (L) 01/26/2021   GFRNONAA 35 (L) 02/18/2021   GFRAA >60 01/08/2019   NA 133 (L) 02/18/2021   K 4.3 02/18/2021   CALCIUM 9.2 02/18/2021   CO2 22 02/18/2021    Lab Results  Component Value Date/Time   HGBA1C 8.3 (H) 01/26/2021 07:56 AM   HGBA1C 9.3 (A) 12/02/2020 08:28 AM   HGBA1C 7.2 (A) 03/09/2020 10:43 AM   HGBA1C 7.6 (H) 11/12/2017 10:29 AM   HGBA1C 7.7 01/20/2016 12:00 AM   GFR 42.53 (L) 01/26/2021 07:56 AM   GFR 46.21 (  L) 09/03/2019 11:09 AM   MICROALBUR 23.9 (H) 09/03/2019 11:09 AM   MICROALBUR 26.1 (H) 01/12/2017 11:42 AM    Last diabetic Eye exam:  Lab Results  Component Value Date/Time   HMDIABEYEEXA Retinopathy (A)  09/17/2017 12:00 AM    Last diabetic Foot exam: No results found for: HMDIABFOOTEX   Lab Results  Component Value Date   CHOL 148 12/12/2019   HDL 30 (L) 12/12/2019   [place]  12/12/2019     Comment:     . LDL cholesterol not calculated. Triglyceride levels greater than 400 mg/dL invalidate calculated LDL results. . Reference range: <100 . Desirable range <100 mg/dL for primary prevention;   <70 mg/dL for patients with CHD or diabetic patients  with > or = 2 CHD risk factors. Marland Kitchen LDL-C is now calculated using the Martin-Hopkins  calculation, which is a validated novel method providing  better accuracy than the Friedewald equation in the  estimation of LDL-C.  Cresenciano Genre et al. Annamaria Helling. 0938;182(99): 2061-2068  (http://education.[facility].com/faq/FAQ164)    TRIG 414 (H) 12/12/2019   CHOLHDL 4.9 12/12/2019    Hepatic Function Latest Ref Rng & Units 01/26/2021 05/04/2017 01/20/2016  Total Protein 6.0 - 8.3 g/dL 6.8 6.8 -  Albumin 3.5 - 5.2 g/dL 4.0 4.2 -  AST 0 - 37 U/L _0 ALT 0 - 35 U/L _1 Alk Phosphatase 39 - 117 U/L 71 98 -  Total Bilirubin 0.2 - 1.2 mg/dL 0.9 0.6 -  Bilirubin, Direct 0.0 - 0.3 mg/dL 0.1 - -    Lab Results  Component Value Date/Time   TSH 1.25 09/03/2019 11:09 AM   TSH 0.92 05/04/2017 01:48 PM   FREET4 0.66 02/14/2015 08:45 AM    CBC Latest Ref Rng & Units 02/18/2021 02/15/2021 01/26/2021  WBC 4.0 - 10.5 K/uL 7.5 7.7 9.1  Hemoglobin 12.0 - 15.0 g/dL 13.8 14.4 14.7  Hematocrit 36.0 - 46.0 % 40.9 41.8 44.1  Platelets 150 - 400 K/uL 181 188 192.0    Lab Results  Component Value Date/Time   VD25OH 66.83 09/03/2019 11:09 AM   VD25OH 62.77 08/23/2018 01:27 PM    Clinical ASCVD: No  The 10-year ASCVD risk score (Arnett DK, et al., 2019) is: 46.4%   Values used to calculate the score:     Age: 82 years     Sex: Female     Is Non-Hispanic African American: No     Diabetic: Yes     Tobacco smoker: No     Systolic Blood Pressure: 371  mmHg     Is BP treated: Yes     HDL Cholesterol: 30 mg/dL     Total Cholesterol: 148 mg/dL    Depression screen [facility] 2/9 01/26/2021 12/09/2020 12/06/2020  Decreased Interest 0 3 1  Down, Depressed, Hopeless 0 3 0  PHQ - 2 Score 0 6 1  Altered sleeping 3 - 0  Tired, decreased energy 3 - 0  Change in appetite 0 - 0  Feeling bad or failure about yourself  3 - 1  Trouble concentrating 3 - 1  Moving slowly or fidgety/restless 0 - 0  Suicidal thoughts 0 - 0  PHQ-9 Score 12 - 3  Difficult doing work/chores Somewhat difficult - Not difficult at all  Some recent data might be hidden     Social History   Tobacco Use  Smoking Status Former   Types: Cigarettes   Passive exposure: Past  Smokeless Tobacco Never  Tobacco Comments  smoke 1 cigarette every few weeks when the [place] lady came by.   BP Readings from Last 3 Encounters:  02/18/21 131/73  02/15/21 (!) 176/76  01/26/21 124/80   Pulse Readings from Last 3 Encounters:  02/18/21 (!) 58  02/15/21 68  01/26/21 66   Wt Readings from Last 3 Encounters:  02/15/21 203 lb (92.1 kg)  01/26/21 202 lb 6 oz (91.8 kg)  01/04/21 210 lb 6.4 oz (95.4 kg)    Assessment/Interventions: Review of patient past medical history, allergies, medications, health status, including review of consultants reports, laboratory and other test data, was performed as part of comprehensive evaluation and provision of chronic care management services.   SDOH:  (Social Determinants of Health) assessments and interventions performed: No   CCM Care Plan  Allergies  Allergen Reactions   Amoxil [Amoxicillin] Swelling and Other (See Comments)    Lips Has patient had a PCN reaction causing immediate rash, facial/tongue/throat swelling, SOB or lightheadedness with hypotension:YES Has patient had a PCN reaction causing severe rash involving mucus membranes or skin necrosis:No Has patient had a PCN reaction that required hospitalization:No Has patient had a PCN  reaction occurring within the last 10 years:Yes. If all of the above answers are "NO", then may proceed with Cephalosporin use.    Lisinopril Itching and Swelling    SWELLING REACTION UNSPECIFIED    Chocolate Hives   Demerol Nausea And Vomiting   Other Other (See Comments)    Raw food or nuts - GI pain    Medications Reviewed Today     Reviewed by Francis Gaines, LCSW (Social Worker) on 02/24/21 at [phone]  Med List Status: <None>   Medication Order Taking? Sig Documenting Provider Last Dose Status Informant  acetaminophen (TYLENOL) 325 MG tablet [id number] No Take 325 mg by mouth every 6 (six) hours as needed. [provider] Taking Active Child  acetaminophen (TYLENOL) 500 MG tablet [id number] No Take 500 mg by mouth every 6 (six) hours as needed for mild pain, fever or headache. [provider] Taking Active Child  aspirin EC 81 MG tablet [id number] No Take 81 mg by mouth at bedtime.  [provider] Taking Active Child           Med Note Armandina Gemma, Edson Snowball May 05, 2019 10:27 AM)    Calcium Carb-Cholecalciferol 600-200 MG-UNIT TABS [id number] No Take 1 tablet by mouth in the morning and at bedtime.  [provider] Taking Active Child  Cholecalciferol (DIALYVITE VITAMIN D 5000) 125 MCG (5000 UT) capsule [id number] No Take 5,000-10,000 Units by mouth See admin instructions. 10,000 units in the morning 5,000 units in the evening [provider] Taking Active Child  Continuous Blood Gluc Receiver (DEXCOM G6 RECEIVER) DEVI [id number] No 1 Device by Does not apply route daily. Shamleffer, Melanie Crazier, MD Taking Active Child  Continuous Blood Gluc Sensor (DEXCOM G6 SENSOR) MISC [id number] No 1 kit by Does not apply route as directed. Shamleffer, Melanie Crazier, MD Taking Active Child  Continuous Blood Gluc Transmit (DEXCOM G6 TRANSMITTER) MISC [id number] No 1 kit by Does not apply route as directed. Shamleffer, Melanie Crazier, MD Taking Active  Child  digoxin (LANOXIN) 0.125 MG tablet [id number] No TAKE 1 TABLET BY MOUTH DAILY  Patient taking differently: Take 0.125 mg by mouth daily.   Jerline Pain, MD Taking Active Child  docusate sodium (COLACE) 100 MG capsule [id number] No Take 100 mg by mouth 2 (two) times daily. [provider] Taking  Active Child  DULoxetine (CYMBALTA) 30 MG capsule [id number] No TAKE 2 CAPSULES BY MOUTH EVERY DAY  Patient taking differently: 60 mg every evening.   Izora Ribas, MD Taking Active Child  ezetimibe (ZETIA) 10 MG tablet [id number] No TAKE 1 TABLET BY MOUTH EVERY DAY  Patient taking differently: Take 10 mg by mouth daily.   [name], [name], MD Taking Active Child  insulin aspart (NOVOLOG FLEXPEN) 100 UNIT/ML FlexPen [id number] No Max daily 75 units per correction scale  Patient taking differently: Inject 12-16 Units into the skin 3 (three) times daily with meals. 12 units in the morning and midday 16 units in the evening   Shamleffer, Melanie Crazier, MD Taking Active Child  insulin glargine (LANTUS SOLOSTAR) 100 UNIT/ML Solostar Pen [id number] No Inject 26 Units into the skin daily.  Patient taking differently: Inject 24 Units into the skin daily.   Shamleffer, Melanie Crazier, MD Taking Active Child  Insulin Pen Needle (BD PEN NEEDLE MICRO U/F) 32G X 6 MM MISC [id number] No Inject 1 Device into the skin QID. USE 4 TIMES A DAY NOVOLOG(3) AND LANTUS9(1) Shamleffer, Melanie Crazier, MD Taking Active Child  linaclotide [facility]) 145 MCG CAPS capsule [id number] No Take 1 capsule (145 mcg total) by mouth daily before breakfast. [name], [name], MD Taking Active Child  meloxicam (MOBIC) 15 MG tablet [id number]  TAKE 1 TABLET (15 MG TOTAL) BY MOUTH DAILY. Izora Ribas, MD  Active   Menthol, Topical Analgesic, (BLUE-EMU MAXIMUM STRENGTH EX) [id number] No Apply 1 application topically as needed (pain). [provider] Taking Active Child  metoprolol succinate (TOPROL-XL) 25 MG 24 hr  tablet [id number] No TAKE 1 TABLET BY MOUTH DAILY. PLEASE MAKE OVERDUE APPT WITH DR. Marlou Porch BEFORE ANYMORE REFILLS.  Patient taking differently: Take 25 mg by mouth daily.   Jerline Pain, MD Taking Active Child  Multiple Vitamins-Minerals (PRESERVISION AREDS 2 PO) [id number] No Take 1 tablet by mouth 2 (two) times daily.  [provider] Taking Active Child  MYRBETRIQ 50 MG TB24 tablet [id number] No TAKE 1 TABLET BY MOUTH EVERY DAY  Patient taking differently: Take 50 mg by mouth daily.   [name], [name], MD Taking Active Child  omega-3 acid ethyl esters (LOVAZA) 1 g capsule [id number] No Take 1 capsule (1 g total) by mouth 2 (two) times daily. [name], [name], MD Taking Active Child  omeprazole (PRILOSEC) 20 MG capsule [id number] No Take 1 capsule (20 mg total) by mouth daily. [name], [name], MD Taking Active Child  PFIZER-BIONT COVID-19 VAC-TRIS SUSP injection [id number] No  [provider] Taking Active Child  polyethylene glycol (MIRALAX / GLYCOLAX) 17 g packet [id number] No Take 17 g by mouth daily as needed for mild constipation. [provider] Taking Active Child  pregabalin (LYRICA) 75 MG capsule [id number] No TAKE 1 CAPSULE BY MOUTH 3 TIMES DAILY.  Patient taking differently: Take 75 mg by mouth 3 (three) times daily.   Izora Ribas, MD Taking Active Child  ramipril (ALTACE) 10 MG capsule [id number] No TAKE 1 CAPSULE BY MOUTH EVERY EVENING.  Patient taking differently: Take 10 mg by mouth every evening.   [name], [name], MD Taking Active Child  RELION INSULIN SYRINGE 1ML/31G 31G X 5/16" 1 ML MISC [id number] No Use as directed [name], [name], MD Taking Active Child  rOPINIRole (REQUIP) 1 MG tablet [id number] No TAKE 1 TABLET BY MOUTH EVERYDAY AT BEDTIME  Patient taking differently: Take 1 mg by mouth at bedtime.  [name], [name], MD Taking Active Child  rosuvastatin (CRESTOR) 10 MG tablet [id number] No TAKE 1 TABLET BY MOUTH EVERY DAY  Patient taking  differently: Take 10 mg by mouth daily.   [name], [name], MD Taking Active Child  verapamil (CALAN) 120 MG tablet [id number] No Take 1 tablet (120 mg total) by mouth daily as needed (afib). Jerline Pain, MD Taking Active Child  verapamil (VERELAN PM) 240 MG 24 hr capsule [id number] No TAKE 1 CAPSULE BY MOUTH EVERY DAY  Patient taking differently: Take 240 mg by mouth daily.   Jerline Pain, MD Taking Active Child  vitamin B-12 (CYANOCOBALAMIN) 1000 MCG tablet [id number] No Take 1,000 mcg by mouth daily. [provider] Taking Active Child            Patient Active Problem List   Diagnosis Date Noted   Type 2 diabetes mellitus with hyperglycemia, with long-term current use of insulin ([place]) 12/02/2020   Coronary artery calcification 11/23/2020   Anxiety disorder 11/19/2020   Aortic atherosclerosis ([place]) 09/20/2019   Hyperlipidemia associated with type 2 diabetes mellitus ([place]) 09/17/2019   Mild recurrent major depression ([place]) 09/17/2019   Type 2 diabetes mellitus with diabetic polyneuropathy, with long-term current use of insulin ([place]) 09/04/2019   Fatigue 09/03/2019   Dysuria 09/03/2019   Peripheral neuropathy 02/28/2019   Thoracic myelopathy 01/08/2019   Cervical spine fracture ([place]) 01/22/2018   Weakness 01/21/2018   Closed C7 fracture ([place]) 01/21/2018   Fall    Lower urinary tract infectious disease    Cervical spondylosis with myelopathy 11/12/2017   Tinnitus of both ears 10/19/2017   Rhinorrhea 09/26/2017   Chronic diarrhea 08/08/2017   S/P left TKA 04/10/2017   Unstable gait 01/12/2017   Generalized osteoarthritis of hand 01/12/2017   RLS (restless legs syndrome) 11/09/2016   GERD (gastroesophageal reflux disease) 07/13/2016   Morbid obesity ([place]) 06/01/2016   Vitamin D deficiency 06/01/2016   Vitamin B12 deficiency 03/27/2016   Chronic cough 03/16/2016   Left foot pain 03/16/2016   Degenerative drusen 07/13/2013   Type 2 diabetes mellitus without  ophthalmic manifestations ([place]) 07/13/2013   DM (diabetes mellitus) type II controlled, neurological manifestation ([place]) 05/07/2012   SVT (supraventricular tachycardia) ([place]) 05/07/2012   Essential hypertension, benign 05/07/2012   Fibromyalgia 05/07/2012    Immunization History  Administered Date(s) Administered   Fluad Quad(high Dose 65+) 11/08/2018, 12/12/2019   Influenza, High Dose Seasonal PF 11/10/2016, 12/27/2017   Influenza-Unspecified 11/13/2020   PFIZER Comirnaty(Gray Top)Covid-19 Tri-Sucrose Vaccine 08/05/2020   PFIZER(Purple Top)SARS-COV-2 Vaccination 03/28/2019, 04/22/2019, 12/22/2019   Pneumococcal Conjugate-13 08/13/2012   Pneumococcal Polysaccharide-23 08/23/2007, 12/01/2019   Tdap 11/13/2019   Zoster Recombinat (Shingrix) 12/01/2019, 08/05/2020   Patient is thinking about going to a nursing home and has fallen so many times. Patient's daughter is not living with her right now but she does have an Health and safety inspector.   Conditions to be addressed/monitored:  Hypertension, Hyperlipidemia, Diabetes, GERD, Osteoarthritis, Overactive Bladder and bone health, paroxysmal atrial tachycardia  Conditions addressed this visit: Hypertension, diabetes  Care Plan : CCM Pharmacy Care Plan  Updates made by Viona Gilmore, [place] since 03/02/2021 12:00 AM     Problem: Problem: Hypertension, Hyperlipidemia, Diabetes, Atrial Fibrillation, GERD and bone health      Long-Range Goal: Patient-Specific Goal   Start Date: 04/08/2020  Expected End Date: 04/08/2021  Recent Progress: On track  Priority: High  Note:   Current Barriers:  Unable to independently monitor therapeutic efficacy Unable to achieve  control of cholesterol   Pharmacist Clinical Goal(s):  Patient will achieve adherence to monitoring guidelines and medication adherence to achieve therapeutic efficacy achieve control of cholesterol as evidenced by next lipid panel  through collaboration with PharmD and provider.    Interventions: 1:1 collaboration with [name], [name], MD regarding development and update of comprehensive plan of care as evidenced by provider attestation and co-signature Inter-disciplinary care team collaboration (see longitudinal plan of care) Comprehensive medication review performed; medication list updated in electronic medical record  Hypertension (BP goal <140/90) -Controlled -Current treatment: Metoprolol succinate (Toprol- XL) 57m, 1 tablet once daily - in AM  - Appropriate, Effective, Safe, Accessible Ramipril 139m 1 capsule every evening  - Appropriate, Effective, Safe, Accessible Verapamil 24041m1 capsule once daily - in AM - Appropriate, Effective, Safe, Accessible -Medications previously tried: none  -Current home readings: 120/70, 126/80 (arm cuff) -Current dietary habits: did not discuss -Current exercise habits: PT exercises -Denies hypotensive/hypertensive symptoms -Educated on Importance of home blood pressure monitoring; -Counseled to monitor BP at home weekly, document, and provide log at future appointments -Counseled on diet and exercise extensively Recommended to continue current medication  Hyperlipidemia: (LDL goal < 70) -Uncontrolled -Current treatment: Ezetimibe 74m76m tablet once daily - Appropriate, Query effective, Safe, Accessible Rosuvastatin 10 mg 1 tablet daily - Appropriate, Query effective , Safe, Accessible -Medications previously tried: Lovaza -Current dietary patterns: patient limits fatty foods and is eating more greens and salads -Current exercise habits: PT exercises -Educated on Cholesterol goals;  Importance of limiting foods high in cholesterol; -Recommended to continue current medication  Diabetes (A1c goal <7%) -Not ideally controlled -Current medications: Lantus inject 18 units daily - appropriate, effective, safe, accessible Novolog inject 8 units with breakfast and lunch and 10 units with supper - appropriate,  effective, safe, accessible -Medications previously tried: n/a  -Current home glucose readings: uses Dexcom CGM; 120-130s -Denies hypoglycemic/hyperglycemic symptoms -Current meal patterns:  breakfast: eggs and bacon lunch: salad, meat (no carbs) dinner: n/a snacks: n/a drinks: water  -Current exercise: PT exercises -Educated on Exercise goal of 150 minutes per week; Carbohydrate counting and/or plate method -Counseled to check feet daily and get yearly eye exams -Counseled on diet and exercise extensively Recommended to continue current medication  Paroxysmal atrial tachycardia (Goal: regulate heart rate) -Controlled -Current treatment: Digoxin 0.125mg69mtablet one daily - Appropriate, Effective, Safe, Accessible Metoprolol succinate (Toprol- XL) 25mg,63mablet once daily - Appropriate, Effective, Safe, Accessible Verapamil 120mg, 11mblet as needed  - Appropriate, Effective, Safe, Accessible Verapamil 240mg, 159msule once daily - Appropriate, Effective, Safe, Accessible -Medications previously tried: n/a -Home BP and HR readings: did not provide  -Counseled on  monitoring HR along with BP -Recommended to continue current medication   Neuropathy (Goal: minimize symptoms) -Controlled -Current treatment  Duloxetine 30 mg 1 capsule daily - Appropriate, Effective, Safe, Accessible Pregabalin 50 mg 1 capsule twice daily - Appropriate, Effective, Safe, Accessible -Medications previously tried: none -Recommended to continue current medication  Urinary urgency (Goal: minimize symptoms) -Controlled -Current treatment  Mirabegron 50 mg 1 tablet daily - Appropriate, Effective, Safe, Accessible -Medications previously tried: none  -Recommended to continue current medication Patient has noticed a big difference with the higher dose  GERD (Goal: minimize symptoms of GERD/acid reflux) -Controlled -Current treatment  Omeprazole 20 mg 1 capsule daily - Appropriate, Effective, Safe,  Accessible -Medications previously tried: none -Counseled on non-pharmacologic  management of symptoms such as elevating the head of your bed, avoiding eating 2-3  hours before bed, avoiding triggering foods such as acidic, spicy, or fatty foods, eating smaller meals, and wearing clothes that are loose around the waist    Osteoarthritis/pain (Goal: minimize symptoms of pain) -Controlled -Current treatment  Tylenol 500 mg 2 tablets before breakfast - Appropriate, Effective, Safe, Accessible Meloxicam 15 mg 1 tablet daily - Appropriate, Effective, Query Safe, Accessible -Medications previously tried: Norco, Percocet  -Recommended to continue current medication  Health Maintenance -Vaccine gaps: 2nd shingles dose -Current therapy:  Bisacodyl 10 mg as needed Vitamin B12 1000 mcg 1 tablet daily Docusate 100 mg 1 capsule twice daily Preservision Areds 2 1 tablet twice daily Aspirin 81 mg 1 tablet daily -Educated on Cost vs benefit of each product must be carefully weighed by individual consumer -Patient is satisfied with current therapy and denies issues -Recommended to continue current medication  Patient Goals/Self-Care Activities Over the next 120 days, patient will:  - take medications as prescribed check glucose with CGM, document, and provide at future appointments check blood pressure weekly, document, and provide at future appointments  Follow Up Plan: Telephone follow up appointment with care management team member scheduled for: 6 months     Medication Assistance: None required.  Patient affirms current coverage meets needs.  Care Gaps: Foot exam, eye exam, COVID booster Last BP - 124/80 on 01/26/2021 Last A1C - 8.3 on 01/26/2021  Star Rating Drug: Ramipril 72m - last [facility] Rosuvastatin 168m- last filled on [facility]/27/20 [facility] Verapamil 12060m last [facility]/28/2021 [facility]  Patient's preferred pharmacy is:  [id number] [place] [id number]: [phone]: [phone] pill box? Yes - daughter manages Pt endorses 100% compliance  We discussed: Benefits of medication synchronization, packaging and delivery as well as enhanced pharmacist oversight with [employer]. Patient decided to: Continue current medication management strategy  Care Plan and Follow Up Patient Decision:  Patient agrees to Care Plan and Follow-up.  Plan: The care management team will reach out to the patient again over the next 60 days.  MadeJeni SallesarmD BCACMountain View Regional Hospitalnical Pharmacist [place]

## 2021-02-24 ENCOUNTER — Encounter: Payer: HMO | Admitting: Physical Medicine and Rehabilitation

## 2021-02-24 ENCOUNTER — Other Ambulatory Visit: Payer: Self-pay

## 2021-02-24 ENCOUNTER — Ambulatory Visit: Payer: PPO | Admitting: *Deleted

## 2021-02-24 ENCOUNTER — Encounter: Payer: HMO | Admitting: Registered Nurse

## 2021-02-24 DIAGNOSIS — R2681 Unsteadiness on feet: Secondary | ICD-10-CM

## 2021-02-24 DIAGNOSIS — I10 Essential (primary) hypertension: Secondary | ICD-10-CM

## 2021-02-24 DIAGNOSIS — E119 Type 2 diabetes mellitus without complications: Secondary | ICD-10-CM

## 2021-02-24 DIAGNOSIS — M159 Polyosteoarthritis, unspecified: Secondary | ICD-10-CM

## 2021-02-24 DIAGNOSIS — R296 Repeated falls: Secondary | ICD-10-CM

## 2021-02-24 DIAGNOSIS — G6289 Other specified polyneuropathies: Secondary | ICD-10-CM

## 2021-02-24 DIAGNOSIS — M797 Fibromyalgia: Secondary | ICD-10-CM

## 2021-02-24 DIAGNOSIS — R531 Weakness: Secondary | ICD-10-CM

## 2021-02-24 DIAGNOSIS — M4712 Other spondylosis with myelopathy, cervical region: Secondary | ICD-10-CM

## 2021-02-24 NOTE — Chronic Care Management (AMB) (Signed)
Chronic Care Management    Clinical Social Work Note  02/24/2021 Name: [name] MRN: [id number] DOB: Nov 04, 1940  [name] is a 81 y.o. year old female who is a primary care patient of [name], Malka So, MD. The CCM team was consulted to assist the patient with chronic disease management and/or care coordination needs related to: [employer], Level of Care Concerns, Long-Term Care Placement, and Caregiver Stress.   Engaged with patient's daughter by telephone for follow up visit in response to provider referral for social work chronic care management and care coordination services.   Consent to Services:  The patient was given information about Chronic Care Management services, agreed to services, and gave verbal consent prior to initiation of services.  Please see initial visit note for detailed documentation.   Patient agreed to services and consent obtained.   Assessment: Review of patient past medical history, allergies, medications, and health status, including review of relevant consultants reports was performed today as part of a comprehensive evaluation and provision of chronic care management and care coordination services.     SDOH (Social Determinants of Health) assessments and interventions performed:    Advanced Directives Status: Not addressed in this encounter.  CCM Care Plan  Allergies  Allergen Reactions   Amoxil [Amoxicillin] Swelling and Other (See Comments)    Lips Has patient had a PCN reaction causing immediate rash, facial/tongue/throat swelling, SOB or lightheadedness with hypotension:YES Has patient had a PCN reaction causing severe rash involving mucus membranes or skin necrosis:No Has patient had a PCN reaction that required hospitalization:No Has patient had a PCN reaction occurring within the last 10 years:Yes. If all of the above answers are "NO", then may proceed with Cephalosporin use.    Lisinopril Itching and Swelling    SWELLING  REACTION UNSPECIFIED    Chocolate Hives   Demerol Nausea And Vomiting   Other Other (See Comments)    Raw food or nuts - GI pain    Outpatient Encounter Medications as of 02/24/2021  Medication Sig   acetaminophen (TYLENOL) 325 MG tablet Take 325 mg by mouth every 6 (six) hours as needed.   acetaminophen (TYLENOL) 500 MG tablet Take 500 mg by mouth every 6 (six) hours as needed for mild pain, fever or headache.   aspirin EC 81 MG tablet Take 81 mg by mouth at bedtime.    Calcium Carb-Cholecalciferol 600-200 MG-UNIT TABS Take 1 tablet by mouth in the morning and at bedtime.    Cholecalciferol (DIALYVITE VITAMIN D 5000) 125 MCG (5000 UT) capsule Take 5,000-10,000 Units by mouth See admin instructions. 10,000 units in the morning 5,000 units in the evening   Continuous Blood Gluc Receiver (DEXCOM G6 RECEIVER) DEVI 1 Device by Does not apply route daily.   Continuous Blood Gluc Sensor (DEXCOM G6 SENSOR) MISC 1 kit by Does not apply route as directed.   Continuous Blood Gluc Transmit (DEXCOM G6 TRANSMITTER) MISC 1 kit by Does not apply route as directed.   digoxin (LANOXIN) 0.125 MG tablet TAKE 1 TABLET BY MOUTH DAILY (Patient taking differently: Take 0.125 mg by mouth daily.)   docusate sodium (COLACE) 100 MG capsule Take 100 mg by mouth 2 (two) times daily.   DULoxetine (CYMBALTA) 30 MG capsule TAKE 2 CAPSULES BY MOUTH EVERY DAY (Patient taking differently: 60 mg every evening.)   ezetimibe (ZETIA) 10 MG tablet TAKE 1 TABLET BY MOUTH EVERY DAY (Patient taking differently: Take 10 mg by mouth daily.)   insulin aspart (NOVOLOG  FLEXPEN) 100 UNIT/ML FlexPen Max daily 75 units per correction scale (Patient taking differently: Inject 12-16 Units into the skin 3 (three) times daily with meals. 12 units in the morning and midday 16 units in the evening)   insulin glargine (LANTUS SOLOSTAR) 100 UNIT/ML Solostar Pen Inject 26 Units into the skin daily. (Patient taking differently: Inject 24 Units into the  skin daily.)   Insulin Pen Needle (BD PEN NEEDLE MICRO U/F) 32G X 6 MM MISC Inject 1 Device into the skin QID. USE 4 TIMES A DAY NOVOLOG(3) AND LANTUS9(1)   linaclotide (LINZESS) 145 MCG CAPS capsule Take 1 capsule (145 mcg total) by mouth daily before breakfast.   meloxicam (MOBIC) 15 MG tablet TAKE 1 TABLET (15 MG TOTAL) BY MOUTH DAILY.   Menthol, Topical Analgesic, (BLUE-EMU MAXIMUM STRENGTH EX) Apply 1 application topically as needed (pain).   metoprolol succinate (TOPROL-XL) 25 MG 24 hr tablet TAKE 1 TABLET BY MOUTH DAILY. PLEASE MAKE OVERDUE APPT WITH DR. Marlou Porch BEFORE ANYMORE REFILLS. (Patient taking differently: Take 25 mg by mouth daily.)   Multiple Vitamins-Minerals (PRESERVISION AREDS 2 PO) Take 1 tablet by mouth 2 (two) times daily.    MYRBETRIQ 50 MG TB24 tablet TAKE 1 TABLET BY MOUTH EVERY DAY (Patient taking differently: Take 50 mg by mouth daily.)   omega-3 acid ethyl esters (LOVAZA) 1 g capsule Take 1 capsule (1 g total) by mouth 2 (two) times daily.   omeprazole (PRILOSEC) 20 MG capsule Take 1 capsule (20 mg total) by mouth daily.   PFIZER-BIONT COVID-19 VAC-TRIS SUSP injection    polyethylene glycol (MIRALAX / GLYCOLAX) 17 g packet Take 17 g by mouth daily as needed for mild constipation.   pregabalin (LYRICA) 75 MG capsule TAKE 1 CAPSULE BY MOUTH 3 TIMES DAILY. (Patient taking differently: Take 75 mg by mouth 3 (three) times daily.)   ramipril (ALTACE) 10 MG capsule TAKE 1 CAPSULE BY MOUTH EVERY EVENING. (Patient taking differently: Take 10 mg by mouth every evening.)   RELION INSULIN SYRINGE 1ML/31G 31G X 5/16" 1 ML MISC Use as directed   rOPINIRole (REQUIP) 1 MG tablet TAKE 1 TABLET BY MOUTH EVERYDAY AT BEDTIME (Patient taking differently: Take 1 mg by mouth at bedtime.)   rosuvastatin (CRESTOR) 10 MG tablet TAKE 1 TABLET BY MOUTH EVERY DAY (Patient taking differently: Take 10 mg by mouth daily.)   verapamil (CALAN) 120 MG tablet Take 1 tablet (120 mg total) by mouth daily as  needed (afib).   verapamil (VERELAN PM) 240 MG 24 hr capsule TAKE 1 CAPSULE BY MOUTH EVERY DAY (Patient taking differently: Take 240 mg by mouth daily.)   vitamin B-12 (CYANOCOBALAMIN) 1000 MCG tablet Take 1,000 mcg by mouth daily.   No facility-administered encounter medications on file as of 02/24/2021.    Patient Active Problem List   Diagnosis Date Noted   Type 2 diabetes mellitus with hyperglycemia, with long-term current use of insulin ([place]) 12/02/2020   Coronary artery calcification 11/23/2020   Anxiety disorder 11/19/2020   Aortic atherosclerosis ([place]) 09/20/2019   Hyperlipidemia associated with type 2 diabetes mellitus ([place]) 09/17/2019   Mild recurrent major depression ([place]) 09/17/2019   Type 2 diabetes mellitus with diabetic polyneuropathy, with long-term current use of insulin ([place]) 09/04/2019   Fatigue 09/03/2019   Dysuria 09/03/2019   Peripheral neuropathy 02/28/2019   Thoracic myelopathy 01/08/2019   Cervical spine fracture ([place]) 01/22/2018   Weakness 01/21/2018   Closed C7 fracture ([place]) 01/21/2018   Fall    Lower urinary  tract infectious disease    Cervical spondylosis with myelopathy 11/12/2017   Tinnitus of both ears 10/19/2017   Rhinorrhea 09/26/2017   Chronic diarrhea 08/08/2017   S/P left TKA 04/10/2017   Unstable gait 01/12/2017   Generalized osteoarthritis of hand 01/12/2017   RLS (restless legs syndrome) 11/09/2016   GERD (gastroesophageal reflux disease) 07/13/2016   Morbid obesity ([place]) 06/01/2016   Vitamin D deficiency 06/01/2016   Vitamin B12 deficiency 03/27/2016   Chronic cough 03/16/2016   Left foot pain 03/16/2016   Degenerative drusen 07/13/2013   Type 2 diabetes mellitus without ophthalmic manifestations ([place]) 07/13/2013   DM (diabetes mellitus) type II controlled, neurological manifestation ([place]) 05/07/2012   SVT (supraventricular tachycardia) ([place]) 05/07/2012   Essential hypertension, benign 05/07/2012   Fibromyalgia 05/07/2012     Conditions to be addressed/monitored: HTN and DMII.  Level of Care Concerns, ADL/IADL Limitations, Limited Access to Caregiver, Cognitive Deficits, Memory Deficits, and Lacks Knowledge of [employer].  Care Plan : LCSW Plan of Care  Updates made by Francis Gaines, LCSW since 02/24/2021 12:00 AM     Problem: Improve My Quality of Life through [place] Placement.   Priority: High     Long-Range Goal: Improve My Quality of Life through [place] Placement.   Start Date: 12/06/2020  Expected End Date: 03/08/2021  This Visit's Progress: On track  Recent Progress: On track  Priority: High  Note:   Current Barriers:  Chronic Disease Management Support, Education, Resources, Referrals, Advocacy, and Case Management Service needs related to Mobility Issues, Fall Risk, Weakness, Fatigue, Morbid Obesity and Chronic Fibromyalgia Pain. Limited ability to perform activities of daily living independently and inability to self-administer medications as prescribed.  Inability to continue to receive 24 hour care and supervision, through private pay caregivers and both daughters.  Clinical Goal(s):  Patient and daughter will work with LCSW to identify and address any acute and/or chronic care coordination needs, as it relates to self-health management.   Patient and both daughters desire for patient to receive placement into a long-term care facility.   LCSW will submit appropriate paperwork to [facility] to try and obtain prior approval and authorization; however, patient is willing to private pay for at least 2 years. LCSW Interventions:  Inter-disciplinary care team collaboration (see longitudinal plan of care). Collaboration with Primary Care Physician, Dr. Betty [name] regarding development and update of comprehensive plan of care as evidenced by provider attestation and co-signature. Patient Goals/Self-Care  Activities:  LCSW will continue to work with daughter, Inaara Tye [phone]; e-mail: [email or web address]), on a bi-weekly basis, in an effort to coordinate long-term care placement for you.  LCSW collaboration with Deirdre Pippins, Admissions Coordinator at [facility] [phone]), to confirm female, private pay bed availability.     ~FL-2 Form faxed to Deirdre Pippins, Admissions Coordinator at [facility], and bed offer received.    ~Possible placement at [facility] within the next 3-5 business days. LCSW collaboration with Elyse Hsu, Admissions Coordinator at [facility] [phone]), to confirm female, private pay bed availability.  ~FL-2 Form faxed to Elyse Hsu, Admissions Coordinator at [facility], but currently no beds available.   ~You are # 34 on the waiting list.   LCSW collaboration with Primary Care Physician, Dr. Betty [name] to confirm orders placed for home health nursing and physical therapy services.  ~Orders for Home  Health Skilled Nursing, Physical Therapy, and [facility] have been placed. Contact LCSW directly (# [phone]) if you have questions, need assistance, or if additional social work needs are identified between now and our next scheduled telephone outreach call.   Follow-Up Plan:  LCSW will transition patient to newly assigned LCSW, and they will schedule follow-up appointment with patient's daughter.       Nat Christen LCSW Licensed Clinical Social Worker [place] [phone]

## 2021-02-24 NOTE — Patient Instructions (Signed)
Visit Information  Thank you for taking time to visit with me today. Please don't hesitate to contact me if I can be of assistance to you before our next scheduled telephone appointment.  Following are the goals we discussed today:  Patient Goals/Self-Care Activities:  LCSW will continue to work with daughter, Alixis Harmon [phone]; e-mail: [email or web address]), on a bi-weekly basis, in an effort to coordinate long-term care placement for you.  LCSW collaboration with Deirdre Pippins, Admissions Coordinator at [facility] [phone]), to confirm female, private pay bed availability.     ~FL-2 Form faxed to Deirdre Pippins, Admissions Coordinator at [facility], and bed offer received.    ~Possible placement at [facility] within the next 3-5 business days. LCSW collaboration with Elyse Hsu, Admissions Coordinator at [facility] [phone]), to confirm female, private pay bed availability.  ~FL-2 Form faxed to Elyse Hsu, Admissions Coordinator at [facility], but currently no beds available.   ~You are # 34 on the waiting list.   LCSW collaboration with Primary Care Physician, Dr. Betty [name] to confirm orders placed for home health nursing and physical therapy services.  ~Orders for Home Health Skilled Nursing, Physical Therapy, and [facility] have been placed. Contact LCSW directly (# [phone]) if you have questions, need assistance, or if additional social work needs are identified between now and our next scheduled telephone outreach call.   Follow-Up Plan:  LCSW will transition patient to newly assigned LCSW, and they will schedule follow-up appointment with patient's daughter.    Please call the care guide team at [phone] if you need to cancel or reschedule your appointment.   If you are experiencing a Mental  Health or [place] or need someone to talk to, please call the Suicide and Crisis Lifeline: 988 call the [place] National Suicide Prevention Lifeline: 770-055-2166 or TTY: 757-480-7728 TTY 564-451-7079) to talk to a trained counselor call 1-800-273-TALK (toll free, 24 hour hotline) go to [facility] [address] [place] [phone]) call the [place]: [phone] call 911   Patient verbalizes understanding of instructions and care plan provided today and agrees to view in [facility]. Active MyChart status confirmed with patient.    Nat Christen LCSW Licensed Clinical Social Worker [place] [phone]

## 2021-03-01 DIAGNOSIS — R7309 Other abnormal glucose: Secondary | ICD-10-CM | POA: Diagnosis not present

## 2021-03-01 DIAGNOSIS — K219 Gastro-esophageal reflux disease without esophagitis: Secondary | ICD-10-CM | POA: Diagnosis not present

## 2021-03-01 DIAGNOSIS — R296 Repeated falls: Secondary | ICD-10-CM | POA: Diagnosis not present

## 2021-03-01 DIAGNOSIS — I10 Essential (primary) hypertension: Secondary | ICD-10-CM | POA: Diagnosis not present

## 2021-03-01 DIAGNOSIS — K5900 Constipation, unspecified: Secondary | ICD-10-CM | POA: Diagnosis not present

## 2021-03-01 DIAGNOSIS — G2581 Restless legs syndrome: Secondary | ICD-10-CM | POA: Diagnosis not present

## 2021-03-01 DIAGNOSIS — I2584 Coronary atherosclerosis due to calcified coronary lesion: Secondary | ICD-10-CM | POA: Diagnosis not present

## 2021-03-01 DIAGNOSIS — M797 Fibromyalgia: Secondary | ICD-10-CM | POA: Diagnosis not present

## 2021-03-01 DIAGNOSIS — M6281 Muscle weakness (generalized): Secondary | ICD-10-CM | POA: Diagnosis not present

## 2021-03-01 DIAGNOSIS — E559 Vitamin D deficiency, unspecified: Secondary | ICD-10-CM | POA: Diagnosis not present

## 2021-03-01 DIAGNOSIS — E1122 Type 2 diabetes mellitus with diabetic chronic kidney disease: Secondary | ICD-10-CM | POA: Diagnosis not present

## 2021-03-01 DIAGNOSIS — G629 Polyneuropathy, unspecified: Secondary | ICD-10-CM | POA: Diagnosis not present

## 2021-03-01 DIAGNOSIS — E782 Mixed hyperlipidemia: Secondary | ICD-10-CM | POA: Diagnosis not present

## 2021-03-01 DIAGNOSIS — M1990 Unspecified osteoarthritis, unspecified site: Secondary | ICD-10-CM | POA: Diagnosis not present

## 2021-03-02 NOTE — Patient Instructions (Signed)
Visit Information   Goals Addressed   None    Patient Care Plan: CCM Pharmacy Care Plan     Problem Identified: Problem: Hypertension, Hyperlipidemia, Diabetes, Atrial Fibrillation, GERD and bone health      Long-Range Goal: Patient-Specific Goal   Start Date: 04/08/2020  Expected End Date: 04/08/2021  Recent Progress: On track  Priority: High  Note:   Current Barriers:  Unable to independently monitor therapeutic efficacy Unable to achieve control of cholesterol   Pharmacist Clinical Goal(s):  Patient will achieve adherence to monitoring guidelines and medication adherence to achieve therapeutic efficacy achieve control of cholesterol as evidenced by next lipid panel  through collaboration with PharmD and provider.   Interventions: 1:1 collaboration with [name], [name], MD regarding development and update of comprehensive plan of care as evidenced by provider attestation and co-signature Inter-disciplinary care team collaboration (see longitudinal plan of care) Comprehensive medication review performed; medication list updated in electronic medical record  Hypertension (BP goal <140/90) -Controlled -Current treatment: Metoprolol succinate (Toprol- XL) 25mg , 1 tablet once daily - in AM  - Appropriate, Effective, Safe, Accessible Ramipril 10mg , 1 capsule every evening  - Appropriate, Effective, Safe, Accessible Verapamil 240mg , 1 capsule once daily - in AM - Appropriate, Effective, Safe, Accessible -Medications previously tried: none  -Current home readings: 120/70, 126/80 (arm cuff) -Current dietary habits: did not discuss -Current exercise habits: PT exercises -Denies hypotensive/hypertensive symptoms -Educated on Importance of home blood pressure monitoring; -Counseled to monitor BP at home weekly, document, and provide log at future appointments -Counseled on diet and exercise extensively Recommended to continue current medication  Hyperlipidemia: (LDL goal <  70) -Uncontrolled -Current treatment: Ezetimibe 10mg , 1 tablet once daily - Appropriate, Query effective, Safe, Accessible Rosuvastatin 10 mg 1 tablet daily - Appropriate, Query effective , Safe, Accessible -Medications previously tried: Lovaza -Current dietary patterns: patient limits fatty foods and is eating more greens and salads -Current exercise habits: PT exercises -Educated on Cholesterol goals;  Importance of limiting foods high in cholesterol; -Recommended to continue current medication  Diabetes (A1c goal <7%) -Not ideally controlled -Current medications: Lantus inject 18 units daily - appropriate, effective, safe, accessible Novolog inject 8 units with breakfast and lunch and 10 units with supper - appropriate, effective, safe, accessible -Medications previously tried: n/a  -Current home glucose readings: uses Dexcom CGM; 120-130s -Denies hypoglycemic/hyperglycemic symptoms -Current meal patterns:  breakfast: eggs and bacon lunch: salad, meat (no carbs) dinner: n/a snacks: n/a drinks: water  -Current exercise: PT exercises -Educated on Exercise goal of 150 minutes per week; Carbohydrate counting and/or plate method -Counseled to check feet daily and get yearly eye exams -Counseled on diet and exercise extensively Recommended to continue current medication  Paroxysmal atrial tachycardia (Goal: regulate heart rate) -Controlled -Current treatment: Digoxin 0.125mg , 1 tablet one daily - Appropriate, Effective, Safe, Accessible Metoprolol succinate (Toprol- XL) 25mg , 1 tablet once daily - Appropriate, Effective, Safe, Accessible Verapamil 120mg , 1 tablet as needed  - Appropriate, Effective, Safe, Accessible Verapamil 240mg , 1 capsule once daily - Appropriate, Effective, Safe, Accessible -Medications previously tried: n/a -Home BP and HR readings: did not provide  -Counseled on  monitoring HR along with BP -Recommended to continue current medication   Neuropathy  (Goal: minimize symptoms) -Controlled -Current treatment  Duloxetine 30 mg 1 capsule daily - Appropriate, Effective, Safe, Accessible Pregabalin 50 mg 1 capsule twice daily - Appropriate, Effective, Safe, Accessible -Medications previously tried: none -Recommended to continue current medication  Urinary urgency (Goal: minimize symptoms) -Controlled -Current treatment  Mirabegron 50 mg 1 tablet daily - Appropriate, Effective, Safe, Accessible -Medications previously tried: none  -Recommended to continue current medication Patient has noticed a big difference with the higher dose  GERD (Goal: minimize symptoms of GERD/acid reflux) -Controlled -Current treatment  Omeprazole 20 mg 1 capsule daily - Appropriate, Effective, Safe, Accessible -Medications previously tried: none -Counseled on non-pharmacologic  management of symptoms such as elevating the head of your bed, avoiding eating 2-3 hours before bed, avoiding triggering foods such as acidic, spicy, or fatty foods, eating smaller meals, and wearing clothes that are loose around the waist    Osteoarthritis/pain (Goal: minimize symptoms of pain) -Controlled -Current treatment  Tylenol 500 mg 2 tablets before breakfast - Appropriate, Effective, Safe, Accessible Meloxicam 15 mg 1 tablet daily - Appropriate, Effective, Query Safe, Accessible -Medications previously tried: Norco, Percocet  -Recommended to continue current medication  Health Maintenance -Vaccine gaps: 2nd shingles dose -Current therapy:  Bisacodyl 10 mg as needed Vitamin B12 1000 mcg 1 tablet daily Docusate 100 mg 1 capsule twice daily Preservision Areds 2 1 tablet twice daily Aspirin 81 mg 1 tablet daily -Educated on Cost vs benefit of each product must be carefully weighed by individual consumer -Patient is satisfied with current therapy and denies issues -Recommended to continue current medication  Patient Goals/Self-Care Activities Over the next 120 days,  patient will:  - take medications as prescribed check glucose with CGM, document, and provide at future appointments check blood pressure weekly, document, and provide at future appointments  Follow Up Plan: Telephone follow up appointment with care management team member scheduled for: 6 months    Patient Care Plan: LCSW Plan of Care     Problem Identified: Social and Functional Symptoms impacting patient's overall mental healtha and QOL Resolved 12/06/2020  Priority: High     Long-Range Goal: Provide pt with resources, support and guidance to enhance her mental health, wellness and QOL Completed 12/06/2020  Start Date: 11/17/2020  Expected End Date: 12/06/2020  This Visit's Progress: On track  Recent Progress: On track  Priority: High  Note:   Current barriers:   Patient in need of assistance with connecting to community resources for Financial constraints related to needing help with paying of bills/managing bills , Limited social support, Level of care concerns, Mental Health Concerns , Family and relationship dysfunction, Inability to perform ADL's independently, and Lacks knowledge of community resources Acknowledges deficits with meeting this unmet need Patient is unable to independently navigate community resource options without care coordination support Clinical Goals:  demonstrate a reduction in symptoms related to :Anxiety and Depression  patient will work with SW to address concerns related to level of care needs, legal and personal support needs and mental health wellness patient will follow up with personal Attorney* as directed by SW Clinical Interventions:  11/18/20- Pt has contacted her personal Attorney and states she has an appointment scheduled. Pt continues to decline seeking placement at this time; " I want to see my lawyer first".  Pt feels her in-home caregivers are sufficient at this time.   Pt denies SI/HI and also declines counseling at this time; "I'm just  lonely". CSW encouraged pt to consider getting involved and more active in the community- her church ramp/steps are not safe and thus has not been able to attend. CSW suggested she might consider volunteering or going to some [employer] activities; said her caregiver might would also go with her (and drive).    11/17/20 CSW spoke with pt who  shared that her local daughter [name]) alerted her over the weekend to her plans to "move out of state in 6 months and will no longer help her". Per pt, daughter has been managing her bills being paid online. Pt also reports having private duty caregivers from [place] and 5pm-7pm.  She has a life alert and does stay alone at night. Her caregivers help her with transportation to appointments, errands, etc.  May benefit from [facility] PT/OT/RN assessments- PCP to arrange if agrees Discussed options for increasing her in-home caregiver support hours as needed as well as facility placement options. Pt lives in [place] and worked at [employer] in [place]. 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 Pt has property/assets and would be private pay in a facility- level of care needed would be determined prior to move. Pt may consider this; however, initially wants to pursue legal support to get things set as she prefers. CSW mentioned to pt there are agencies that offer Payee support and CSW will ask Care Guide to follow up with pt with info/resources for this if she decides to pursue further.  Pt also shared that her daughter is HCPOA and voices; "unfortunately".  CSW alerted pt to the importance of she making nay changes to her legal decisions soon and plans to do so through her existing personal Attorney.  Collaboration with [name], [name], MD regarding development and update of comprehensive plan of care as evidenced by provider attestation and co-signature Inter-disciplinary care team collaboration (see longitudinal plan of  care) Assessment of needs, barriers , agencies contacted, as well as how impacting  Review various resources, discussed options and provided patient information about Financial constraints related to daughter stating she is moving out of state in 6 months and will no longer oversee paying of pt's bills, Level of care concerns, ADL IADL limitations, Mental Health Concerns , Family and relationship dysfunction, and Inability to perform ADL's independently Collaborated with appropriate clinical care team members regarding patient needs Limited social support, Inability to perform ADL's independently, and Lacks knowledge of community resource:   , Anxiety, Depression, and chronic pain Referral placed via [facility] Patient interviewed and appropriate assessments performed Provided mental health counseling with regard to anxiety/depression  Provided patient with information about seeking placement, levels of care, payment process, legal rights re: HCPOA, Durable POA, etc and private duty care  Discussed plans with patient for ongoing care management follow up and provided patient with direct contact information for care management team Advised patient to contact her personal Attorney to request a meeting asap to discuss and change her POA documents and other legal decisions made in past to support her current wishes. Collaborated with primary care provider re: LCSW assessment and Care Plan Provided education and assistance to client regarding Advanced Directives. Provided education to patient/caregiver regarding level of care options. Other interventions provided: Depression screen reviewed  Solution-Focused Strategies  Active listening / Reflection utilized  Emotional Support Provided Problem [place] strategies reviewed Provided brief CBT  Consideration of in-home help encouraged  [place] Community of Attorney  Patient Goals:  -meet with your personal Attorney to plan to  meet asap regarding your wishes -consider additional help in home as needed -versus- moving to a facility (Assisted Living level of care may be appropriate) - begin a notebook of services in my neighborhood or community - call 211 when I need some help - follow-up on any referrals  for help I am given - think ahead to make sure my need does not become an emergency - make a note about what I need to have by the phone or take with me, like an identification card or social security number have a back-up plan - have a back-up plan - make a list of family or friends that I can call  -  Follow Up Plan: Appointment scheduled for SW follow up with client by phone on:   to be determined by Nat Christen, LCSW, upon her return next week.    Problem Identified: Improve My Quality of Life through [facility].   Priority: High     Long-Range Goal: Improve My Quality of Life through [facility].   Start Date: 12/06/2020  Expected End Date: 03/08/2021  This Visit's Progress: On track  Recent Progress: On track  Priority: High  Note:   Current Barriers:  Chronic Disease Management Support, Education, Resources, Referrals, Advocacy, and Case Management Service needs related to Mobility Issues, Fall Risk, Weakness, Fatigue, Morbid Obesity and Chronic Fibromyalgia Pain. Limited ability to perform activities of daily living independently and inability to self-administer medications as prescribed.  Inability to continue to receive 24 hour care and supervision, through private pay caregivers and both daughters.  Clinical Goal(s):  Patient and daughter will work with LCSW to identify and address any acute and/or chronic care coordination needs, as it relates to self-health management.   Patient and both daughters desire for patient to receive placement into a long-term care facility.   LCSW will submit appropriate  paperwork to [facility] to try and obtain prior approval and authorization; however, patient is willing to private pay for at least 2 years. LCSW Interventions:  Inter-disciplinary care team collaboration (see longitudinal plan of care). Collaboration with Primary Care Physician, Dr. Betty [name] regarding development and update of comprehensive plan of care as evidenced by provider attestation and co-signature. Patient Goals/Self-Care Activities:  LCSW will continue to work with daughter, Tonni Mansour [phone]; e-mail: [email or web address]), on a bi-weekly basis, in an effort to coordinate long-term care placement for you.  LCSW collaboration with Deirdre Pippins, Admissions Coordinator at [facility] [phone]), to confirm female, private pay bed availability.     ~FL-2 Form faxed to Deirdre Pippins, Admissions Coordinator at [facility], and bed offer received.    ~Possible placement at [facility] within the next 3-5 business days. LCSW collaboration with Elyse Hsu, Admissions Coordinator at [facility] [phone]), to confirm female, private pay bed availability.  ~FL-2 Form faxed to Elyse Hsu, Admissions Coordinator at [facility], but currently no beds available.   ~You are # [phone] on the waiting list.   LCSW collaboration with Primary Care Physician, Dr. Betty [name] to confirm orders placed for home health nursing and physical therapy services.  ~Orders for Home Health Skilled Nursing, Physical Therapy, and [employer] have been placed. Contact LCSW directly (# [phone]) if you have questions, need assistance, or if additional social work needs are identified between now and our next scheduled telephone outreach call.   Follow-Up Plan:  LCSW will transition patient to newly assigned LCSW, and they will schedule  follow-up appointment with patient's daughter.          Patient verbalizes understanding of instructions and care plan provided today and agrees to view in [place]. Active MyChart  status confirmed with patient.   The pharmacy team will reach out to the patient again over the next 60 days.   Viona Gilmore, [facility]

## 2021-03-04 DIAGNOSIS — E1140 Type 2 diabetes mellitus with diabetic neuropathy, unspecified: Secondary | ICD-10-CM | POA: Diagnosis not present

## 2021-03-04 DIAGNOSIS — R2681 Unsteadiness on feet: Secondary | ICD-10-CM | POA: Diagnosis not present

## 2021-03-04 DIAGNOSIS — I10 Essential (primary) hypertension: Secondary | ICD-10-CM | POA: Diagnosis not present

## 2021-03-04 DIAGNOSIS — F330 Major depressive disorder, recurrent, mild: Secondary | ICD-10-CM | POA: Diagnosis not present

## 2021-03-04 DIAGNOSIS — I480 Paroxysmal atrial fibrillation: Secondary | ICD-10-CM | POA: Diagnosis not present

## 2021-03-04 DIAGNOSIS — M797 Fibromyalgia: Secondary | ICD-10-CM | POA: Diagnosis not present

## 2021-03-04 DIAGNOSIS — I471 Supraventricular tachycardia: Secondary | ICD-10-CM | POA: Diagnosis not present

## 2021-03-04 DIAGNOSIS — M4712 Other spondylosis with myelopathy, cervical region: Secondary | ICD-10-CM | POA: Diagnosis not present

## 2021-03-04 DIAGNOSIS — M545 Low back pain, unspecified: Secondary | ICD-10-CM | POA: Diagnosis not present

## 2021-03-04 DIAGNOSIS — R269 Unspecified abnormalities of gait and mobility: Secondary | ICD-10-CM | POA: Diagnosis not present

## 2021-03-04 DIAGNOSIS — R262 Difficulty in walking, not elsewhere classified: Secondary | ICD-10-CM | POA: Diagnosis not present

## 2021-03-04 DIAGNOSIS — I4821 Permanent atrial fibrillation: Secondary | ICD-10-CM | POA: Diagnosis not present

## 2021-03-04 DIAGNOSIS — M6259 Muscle wasting and atrophy, not elsewhere classified, multiple sites: Secondary | ICD-10-CM | POA: Diagnosis not present

## 2021-03-04 DIAGNOSIS — R531 Weakness: Secondary | ICD-10-CM | POA: Diagnosis not present

## 2021-03-04 DIAGNOSIS — M25561 Pain in right knee: Secondary | ICD-10-CM | POA: Diagnosis not present

## 2021-03-04 DIAGNOSIS — E1142 Type 2 diabetes mellitus with diabetic polyneuropathy: Secondary | ICD-10-CM | POA: Diagnosis not present

## 2021-03-04 DIAGNOSIS — K219 Gastro-esophageal reflux disease without esophagitis: Secondary | ICD-10-CM | POA: Diagnosis not present

## 2021-03-04 DIAGNOSIS — M25562 Pain in left knee: Secondary | ICD-10-CM | POA: Diagnosis not present

## 2021-03-04 DIAGNOSIS — F419 Anxiety disorder, unspecified: Secondary | ICD-10-CM | POA: Diagnosis not present

## 2021-03-05 DIAGNOSIS — F419 Anxiety disorder, unspecified: Secondary | ICD-10-CM | POA: Diagnosis not present

## 2021-03-05 DIAGNOSIS — G629 Polyneuropathy, unspecified: Secondary | ICD-10-CM | POA: Diagnosis not present

## 2021-03-05 DIAGNOSIS — I10 Essential (primary) hypertension: Secondary | ICD-10-CM | POA: Diagnosis not present

## 2021-03-05 DIAGNOSIS — F339 Major depressive disorder, recurrent, unspecified: Secondary | ICD-10-CM | POA: Diagnosis not present

## 2021-03-05 DIAGNOSIS — K5900 Constipation, unspecified: Secondary | ICD-10-CM | POA: Diagnosis not present

## 2021-03-05 DIAGNOSIS — I2584 Coronary atherosclerosis due to calcified coronary lesion: Secondary | ICD-10-CM | POA: Diagnosis not present

## 2021-03-05 DIAGNOSIS — K219 Gastro-esophageal reflux disease without esophagitis: Secondary | ICD-10-CM | POA: Diagnosis not present

## 2021-03-05 DIAGNOSIS — G2581 Restless legs syndrome: Secondary | ICD-10-CM | POA: Diagnosis not present

## 2021-03-05 DIAGNOSIS — I25810 Atherosclerosis of coronary artery bypass graft(s) without angina pectoris: Secondary | ICD-10-CM | POA: Diagnosis not present

## 2021-03-05 DIAGNOSIS — I471 Supraventricular tachycardia: Secondary | ICD-10-CM | POA: Diagnosis not present

## 2021-03-05 DIAGNOSIS — M159 Polyosteoarthritis, unspecified: Secondary | ICD-10-CM | POA: Diagnosis not present

## 2021-03-05 DIAGNOSIS — M1990 Unspecified osteoarthritis, unspecified site: Secondary | ICD-10-CM | POA: Diagnosis not present

## 2021-03-15 DIAGNOSIS — M159 Polyosteoarthritis, unspecified: Secondary | ICD-10-CM

## 2021-03-15 DIAGNOSIS — M4712 Other spondylosis with myelopathy, cervical region: Secondary | ICD-10-CM

## 2021-03-15 DIAGNOSIS — E119 Type 2 diabetes mellitus without complications: Secondary | ICD-10-CM

## 2021-03-15 DIAGNOSIS — I10 Essential (primary) hypertension: Secondary | ICD-10-CM

## 2021-03-16 DIAGNOSIS — R2681 Unsteadiness on feet: Secondary | ICD-10-CM | POA: Diagnosis not present

## 2021-03-16 DIAGNOSIS — I10 Essential (primary) hypertension: Secondary | ICD-10-CM | POA: Diagnosis not present

## 2021-03-16 DIAGNOSIS — M6259 Muscle wasting and atrophy, not elsewhere classified, multiple sites: Secondary | ICD-10-CM | POA: Diagnosis not present

## 2021-03-16 DIAGNOSIS — I4821 Permanent atrial fibrillation: Secondary | ICD-10-CM | POA: Diagnosis not present

## 2021-03-16 DIAGNOSIS — G629 Polyneuropathy, unspecified: Secondary | ICD-10-CM | POA: Diagnosis not present

## 2021-03-16 DIAGNOSIS — K5900 Constipation, unspecified: Secondary | ICD-10-CM | POA: Diagnosis not present

## 2021-03-16 DIAGNOSIS — M25562 Pain in left knee: Secondary | ICD-10-CM | POA: Diagnosis not present

## 2021-03-16 DIAGNOSIS — M4712 Other spondylosis with myelopathy, cervical region: Secondary | ICD-10-CM | POA: Diagnosis not present

## 2021-03-16 DIAGNOSIS — I471 Supraventricular tachycardia: Secondary | ICD-10-CM | POA: Diagnosis not present

## 2021-03-16 DIAGNOSIS — M6281 Muscle weakness (generalized): Secondary | ICD-10-CM | POA: Diagnosis not present

## 2021-03-16 DIAGNOSIS — M1990 Unspecified osteoarthritis, unspecified site: Secondary | ICD-10-CM | POA: Diagnosis not present

## 2021-03-16 DIAGNOSIS — R262 Difficulty in walking, not elsewhere classified: Secondary | ICD-10-CM | POA: Diagnosis not present

## 2021-03-16 DIAGNOSIS — M797 Fibromyalgia: Secondary | ICD-10-CM | POA: Diagnosis not present

## 2021-03-16 DIAGNOSIS — M25561 Pain in right knee: Secondary | ICD-10-CM | POA: Diagnosis not present

## 2021-03-16 DIAGNOSIS — F419 Anxiety disorder, unspecified: Secondary | ICD-10-CM | POA: Diagnosis not present

## 2021-03-16 DIAGNOSIS — M545 Low back pain, unspecified: Secondary | ICD-10-CM | POA: Diagnosis not present

## 2021-03-16 DIAGNOSIS — K219 Gastro-esophageal reflux disease without esophagitis: Secondary | ICD-10-CM | POA: Diagnosis not present

## 2021-03-16 DIAGNOSIS — E1142 Type 2 diabetes mellitus with diabetic polyneuropathy: Secondary | ICD-10-CM | POA: Diagnosis not present

## 2021-03-16 DIAGNOSIS — F330 Major depressive disorder, recurrent, mild: Secondary | ICD-10-CM | POA: Diagnosis not present

## 2021-03-20 DIAGNOSIS — K5900 Constipation, unspecified: Secondary | ICD-10-CM | POA: Diagnosis not present

## 2021-03-20 DIAGNOSIS — W06XXXD Fall from bed, subsequent encounter: Secondary | ICD-10-CM | POA: Diagnosis not present

## 2021-03-20 DIAGNOSIS — R296 Repeated falls: Secondary | ICD-10-CM | POA: Diagnosis not present

## 2021-03-20 DIAGNOSIS — M6281 Muscle weakness (generalized): Secondary | ICD-10-CM | POA: Diagnosis not present

## 2021-03-20 DIAGNOSIS — G629 Polyneuropathy, unspecified: Secondary | ICD-10-CM | POA: Diagnosis not present

## 2021-03-20 DIAGNOSIS — K219 Gastro-esophageal reflux disease without esophagitis: Secondary | ICD-10-CM | POA: Diagnosis not present

## 2021-03-20 DIAGNOSIS — F419 Anxiety disorder, unspecified: Secondary | ICD-10-CM | POA: Diagnosis not present

## 2021-03-20 DIAGNOSIS — F339 Major depressive disorder, recurrent, unspecified: Secondary | ICD-10-CM | POA: Diagnosis not present

## 2021-03-20 DIAGNOSIS — G2581 Restless legs syndrome: Secondary | ICD-10-CM | POA: Diagnosis not present

## 2021-03-20 DIAGNOSIS — M159 Polyosteoarthritis, unspecified: Secondary | ICD-10-CM | POA: Diagnosis not present

## 2021-03-22 ENCOUNTER — Other Ambulatory Visit: Payer: Self-pay | Admitting: Family Medicine

## 2021-03-22 DIAGNOSIS — K5900 Constipation, unspecified: Secondary | ICD-10-CM

## 2021-03-25 DIAGNOSIS — E1140 Type 2 diabetes mellitus with diabetic neuropathy, unspecified: Secondary | ICD-10-CM | POA: Diagnosis not present

## 2021-03-25 DIAGNOSIS — I480 Paroxysmal atrial fibrillation: Secondary | ICD-10-CM | POA: Diagnosis not present

## 2021-03-25 DIAGNOSIS — R269 Unspecified abnormalities of gait and mobility: Secondary | ICD-10-CM | POA: Diagnosis not present

## 2021-03-25 DIAGNOSIS — R531 Weakness: Secondary | ICD-10-CM | POA: Diagnosis not present

## 2021-03-26 DIAGNOSIS — M6281 Muscle weakness (generalized): Secondary | ICD-10-CM | POA: Diagnosis not present

## 2021-03-26 DIAGNOSIS — K5900 Constipation, unspecified: Secondary | ICD-10-CM | POA: Diagnosis not present

## 2021-03-26 DIAGNOSIS — F339 Major depressive disorder, recurrent, unspecified: Secondary | ICD-10-CM | POA: Diagnosis not present

## 2021-03-26 DIAGNOSIS — I25810 Atherosclerosis of coronary artery bypass graft(s) without angina pectoris: Secondary | ICD-10-CM | POA: Diagnosis not present

## 2021-03-26 DIAGNOSIS — M797 Fibromyalgia: Secondary | ICD-10-CM | POA: Diagnosis not present

## 2021-03-26 DIAGNOSIS — F419 Anxiety disorder, unspecified: Secondary | ICD-10-CM | POA: Diagnosis not present

## 2021-03-26 DIAGNOSIS — K219 Gastro-esophageal reflux disease without esophagitis: Secondary | ICD-10-CM | POA: Diagnosis not present

## 2021-03-26 DIAGNOSIS — I10 Essential (primary) hypertension: Secondary | ICD-10-CM | POA: Diagnosis not present

## 2021-03-26 DIAGNOSIS — G629 Polyneuropathy, unspecified: Secondary | ICD-10-CM | POA: Diagnosis not present

## 2021-03-26 DIAGNOSIS — M159 Polyosteoarthritis, unspecified: Secondary | ICD-10-CM | POA: Diagnosis not present

## 2021-03-29 DIAGNOSIS — F4323 Adjustment disorder with mixed anxiety and depressed mood: Secondary | ICD-10-CM | POA: Diagnosis not present

## 2021-03-29 DIAGNOSIS — F419 Anxiety disorder, unspecified: Secondary | ICD-10-CM | POA: Diagnosis not present

## 2021-03-29 DIAGNOSIS — F331 Major depressive disorder, recurrent, moderate: Secondary | ICD-10-CM | POA: Diagnosis not present

## 2021-03-30 ENCOUNTER — Telehealth: Payer: Self-pay | Admitting: Internal Medicine

## 2021-03-30 NOTE — Telephone Encounter (Signed)
Patient's daughter called to cancel appt-states Patient is in a nursing home and under the nursing home care-will not be rescheduling

## 2021-04-03 ENCOUNTER — Other Ambulatory Visit: Payer: Self-pay | Admitting: Family Medicine

## 2021-04-03 DIAGNOSIS — G2581 Restless legs syndrome: Secondary | ICD-10-CM

## 2021-04-05 ENCOUNTER — Ambulatory Visit: Payer: PPO | Admitting: Internal Medicine

## 2021-04-05 ENCOUNTER — Ambulatory Visit: Payer: PPO | Admitting: Physical Medicine and Rehabilitation

## 2021-04-06 DIAGNOSIS — E119 Type 2 diabetes mellitus without complications: Secondary | ICD-10-CM | POA: Diagnosis not present

## 2021-04-06 DIAGNOSIS — K219 Gastro-esophageal reflux disease without esophagitis: Secondary | ICD-10-CM | POA: Diagnosis not present

## 2021-04-06 DIAGNOSIS — F419 Anxiety disorder, unspecified: Secondary | ICD-10-CM | POA: Diagnosis not present

## 2021-04-06 DIAGNOSIS — I5042 Chronic combined systolic (congestive) and diastolic (congestive) heart failure: Secondary | ICD-10-CM | POA: Diagnosis not present

## 2021-04-06 DIAGNOSIS — F339 Major depressive disorder, recurrent, unspecified: Secondary | ICD-10-CM | POA: Diagnosis not present

## 2021-04-06 DIAGNOSIS — M1990 Unspecified osteoarthritis, unspecified site: Secondary | ICD-10-CM | POA: Diagnosis not present

## 2021-04-13 DIAGNOSIS — M797 Fibromyalgia: Secondary | ICD-10-CM | POA: Diagnosis not present

## 2021-04-13 DIAGNOSIS — I10 Essential (primary) hypertension: Secondary | ICD-10-CM | POA: Diagnosis not present

## 2021-04-13 DIAGNOSIS — I4821 Permanent atrial fibrillation: Secondary | ICD-10-CM | POA: Diagnosis not present

## 2021-04-13 DIAGNOSIS — M545 Low back pain, unspecified: Secondary | ICD-10-CM | POA: Diagnosis not present

## 2021-04-13 DIAGNOSIS — R2681 Unsteadiness on feet: Secondary | ICD-10-CM | POA: Diagnosis not present

## 2021-04-13 DIAGNOSIS — M4712 Other spondylosis with myelopathy, cervical region: Secondary | ICD-10-CM | POA: Diagnosis not present

## 2021-04-13 DIAGNOSIS — R262 Difficulty in walking, not elsewhere classified: Secondary | ICD-10-CM | POA: Diagnosis not present

## 2021-04-13 DIAGNOSIS — M25561 Pain in right knee: Secondary | ICD-10-CM | POA: Diagnosis not present

## 2021-04-13 DIAGNOSIS — F419 Anxiety disorder, unspecified: Secondary | ICD-10-CM | POA: Diagnosis not present

## 2021-04-13 DIAGNOSIS — K219 Gastro-esophageal reflux disease without esophagitis: Secondary | ICD-10-CM | POA: Diagnosis not present

## 2021-04-13 DIAGNOSIS — M25562 Pain in left knee: Secondary | ICD-10-CM | POA: Diagnosis not present

## 2021-04-13 DIAGNOSIS — M6259 Muscle wasting and atrophy, not elsewhere classified, multiple sites: Secondary | ICD-10-CM | POA: Diagnosis not present

## 2021-04-13 DIAGNOSIS — F330 Major depressive disorder, recurrent, mild: Secondary | ICD-10-CM | POA: Diagnosis not present

## 2021-04-13 DIAGNOSIS — E1142 Type 2 diabetes mellitus with diabetic polyneuropathy: Secondary | ICD-10-CM | POA: Diagnosis not present

## 2021-04-13 DIAGNOSIS — I471 Supraventricular tachycardia: Secondary | ICD-10-CM | POA: Diagnosis not present

## 2021-04-16 DIAGNOSIS — M6281 Muscle weakness (generalized): Secondary | ICD-10-CM | POA: Diagnosis not present

## 2021-04-16 DIAGNOSIS — M159 Polyosteoarthritis, unspecified: Secondary | ICD-10-CM | POA: Diagnosis not present

## 2021-04-16 DIAGNOSIS — F339 Major depressive disorder, recurrent, unspecified: Secondary | ICD-10-CM | POA: Diagnosis not present

## 2021-04-16 DIAGNOSIS — K5900 Constipation, unspecified: Secondary | ICD-10-CM | POA: Diagnosis not present

## 2021-04-16 DIAGNOSIS — F419 Anxiety disorder, unspecified: Secondary | ICD-10-CM | POA: Diagnosis not present

## 2021-04-16 DIAGNOSIS — I10 Essential (primary) hypertension: Secondary | ICD-10-CM | POA: Diagnosis not present

## 2021-04-16 DIAGNOSIS — G629 Polyneuropathy, unspecified: Secondary | ICD-10-CM | POA: Diagnosis not present

## 2021-04-16 DIAGNOSIS — M797 Fibromyalgia: Secondary | ICD-10-CM | POA: Diagnosis not present

## 2021-04-19 ENCOUNTER — Telehealth: Payer: Self-pay | Admitting: Family Medicine

## 2021-04-19 DIAGNOSIS — R262 Difficulty in walking, not elsewhere classified: Secondary | ICD-10-CM | POA: Diagnosis not present

## 2021-04-19 DIAGNOSIS — M797 Fibromyalgia: Secondary | ICD-10-CM | POA: Diagnosis not present

## 2021-04-19 DIAGNOSIS — M6259 Muscle wasting and atrophy, not elsewhere classified, multiple sites: Secondary | ICD-10-CM | POA: Diagnosis not present

## 2021-04-19 DIAGNOSIS — M4712 Other spondylosis with myelopathy, cervical region: Secondary | ICD-10-CM | POA: Diagnosis not present

## 2021-04-19 DIAGNOSIS — E1142 Type 2 diabetes mellitus with diabetic polyneuropathy: Secondary | ICD-10-CM | POA: Diagnosis not present

## 2021-04-19 NOTE — Telephone Encounter (Signed)
On pcp's desk to be filled out & signed.  ?

## 2021-04-19 NOTE — Telephone Encounter (Signed)
Okay to add Lyrica to the [facility] or should the prescribing provider add it? Diet form is in your bin.  ?

## 2021-04-19 NOTE — Telephone Encounter (Signed)
Patient's daughter, [name], called because she is needing the lyrica '75mg'$  added onto the [facility] because it is currently not on there and facility will not give it to patient. I let daughter know that Dr.Jordan was not the prescribing provider so she may not add it and daughter stated she would also call Dr.Raulkar. Daughter also needs orders for patient to go on the Consistent carbohydrate diet since she is diabetic. She stated that there is four diets and she will be dropping off the paperwork today for Dr.Jordan as they feel the consistent carbohydrate is the best fit, but if pcp feels different then to let her know. ? ? ? ? ? ?Please advise  ?

## 2021-04-19 NOTE — Telephone Encounter (Signed)
[facility] Diet Order form to be filled out, placed in dr's folder.  Fax to: [phone], Attn: Patti. ?

## 2021-04-20 NOTE — Telephone Encounter (Signed)
Lyrica was on the original FL2 that was sent in. Re-faxed FL2 with note to [facility].  ?

## 2021-04-20 NOTE — Telephone Encounter (Signed)
Form faxed in.  ?

## 2021-04-22 NOTE — Telephone Encounter (Signed)
Most likely Lyrica has been prescribed by pain management. So if refills are needed in the future, request needs to be sent to them. ?Thanks, ?BJ ? ? ? ?

## 2021-04-25 ENCOUNTER — Other Ambulatory Visit: Payer: Self-pay | Admitting: Cardiology

## 2021-04-25 DIAGNOSIS — M1991 Primary osteoarthritis, unspecified site: Secondary | ICD-10-CM | POA: Diagnosis not present

## 2021-04-25 DIAGNOSIS — K5901 Slow transit constipation: Secondary | ICD-10-CM | POA: Diagnosis not present

## 2021-04-25 DIAGNOSIS — I10 Essential (primary) hypertension: Secondary | ICD-10-CM | POA: Diagnosis not present

## 2021-04-25 DIAGNOSIS — E1165 Type 2 diabetes mellitus with hyperglycemia: Secondary | ICD-10-CM | POA: Diagnosis not present

## 2021-04-25 DIAGNOSIS — E782 Mixed hyperlipidemia: Secondary | ICD-10-CM | POA: Diagnosis not present

## 2021-04-25 DIAGNOSIS — M6281 Muscle weakness (generalized): Secondary | ICD-10-CM | POA: Diagnosis not present

## 2021-04-25 DIAGNOSIS — M797 Fibromyalgia: Secondary | ICD-10-CM | POA: Diagnosis not present

## 2021-04-25 DIAGNOSIS — N3281 Overactive bladder: Secondary | ICD-10-CM | POA: Diagnosis not present

## 2021-04-25 DIAGNOSIS — G894 Chronic pain syndrome: Secondary | ICD-10-CM | POA: Diagnosis not present

## 2021-04-25 DIAGNOSIS — I491 Atrial premature depolarization: Secondary | ICD-10-CM | POA: Diagnosis not present

## 2021-04-25 MED ORDER — VERAPAMIL HCL 120 MG PO TABS: 120.0000 mg | ORAL_TABLET | Freq: Every day | ORAL | 2 refills | Status: DC | PRN

## 2021-04-28 DIAGNOSIS — E559 Vitamin D deficiency, unspecified: Secondary | ICD-10-CM | POA: Diagnosis not present

## 2021-04-28 DIAGNOSIS — E039 Hypothyroidism, unspecified: Secondary | ICD-10-CM | POA: Diagnosis not present

## 2021-04-28 DIAGNOSIS — I10 Essential (primary) hypertension: Secondary | ICD-10-CM | POA: Diagnosis not present

## 2021-04-28 DIAGNOSIS — E782 Mixed hyperlipidemia: Secondary | ICD-10-CM | POA: Diagnosis not present

## 2021-04-29 DIAGNOSIS — F331 Major depressive disorder, recurrent, moderate: Secondary | ICD-10-CM | POA: Diagnosis not present

## 2021-04-29 DIAGNOSIS — F419 Anxiety disorder, unspecified: Secondary | ICD-10-CM | POA: Diagnosis not present

## 2021-05-05 DIAGNOSIS — Z515 Encounter for palliative care: Secondary | ICD-10-CM | POA: Diagnosis not present

## 2021-05-05 DIAGNOSIS — E119 Type 2 diabetes mellitus without complications: Secondary | ICD-10-CM | POA: Diagnosis not present

## 2021-05-05 DIAGNOSIS — Z794 Long term (current) use of insulin: Secondary | ICD-10-CM | POA: Diagnosis not present

## 2021-05-09 DIAGNOSIS — G894 Chronic pain syndrome: Secondary | ICD-10-CM | POA: Diagnosis not present

## 2021-05-10 DIAGNOSIS — I10 Essential (primary) hypertension: Secondary | ICD-10-CM | POA: Diagnosis not present

## 2021-05-10 DIAGNOSIS — G8324 Monoplegia of upper limb affecting left nondominant side: Secondary | ICD-10-CM | POA: Diagnosis not present

## 2021-05-10 DIAGNOSIS — M25551 Pain in right hip: Secondary | ICD-10-CM | POA: Diagnosis not present

## 2021-05-10 DIAGNOSIS — M159 Polyosteoarthritis, unspecified: Secondary | ICD-10-CM | POA: Diagnosis not present

## 2021-05-10 DIAGNOSIS — Z9049 Acquired absence of other specified parts of digestive tract: Secondary | ICD-10-CM | POA: Diagnosis not present

## 2021-05-10 DIAGNOSIS — K5901 Slow transit constipation: Secondary | ICD-10-CM | POA: Diagnosis not present

## 2021-05-10 DIAGNOSIS — I491 Atrial premature depolarization: Secondary | ICD-10-CM | POA: Diagnosis not present

## 2021-05-10 DIAGNOSIS — E1165 Type 2 diabetes mellitus with hyperglycemia: Secondary | ICD-10-CM | POA: Diagnosis not present

## 2021-05-10 DIAGNOSIS — D649 Anemia, unspecified: Secondary | ICD-10-CM | POA: Diagnosis not present

## 2021-05-10 DIAGNOSIS — G629 Polyneuropathy, unspecified: Secondary | ICD-10-CM | POA: Diagnosis not present

## 2021-05-10 DIAGNOSIS — N3281 Overactive bladder: Secondary | ICD-10-CM | POA: Diagnosis not present

## 2021-05-10 DIAGNOSIS — M797 Fibromyalgia: Secondary | ICD-10-CM | POA: Diagnosis not present

## 2021-05-10 DIAGNOSIS — I471 Supraventricular tachycardia: Secondary | ICD-10-CM | POA: Diagnosis not present

## 2021-05-10 DIAGNOSIS — G8321 Monoplegia of upper limb affecting right dominant side: Secondary | ICD-10-CM | POA: Diagnosis not present

## 2021-05-10 DIAGNOSIS — Z96652 Presence of left artificial knee joint: Secondary | ICD-10-CM | POA: Diagnosis not present

## 2021-05-10 DIAGNOSIS — G894 Chronic pain syndrome: Secondary | ICD-10-CM | POA: Diagnosis not present

## 2021-05-10 DIAGNOSIS — Z6831 Body mass index (BMI) 31.0-31.9, adult: Secondary | ICD-10-CM | POA: Diagnosis not present

## 2021-05-10 DIAGNOSIS — F410 Panic disorder [episodic paroxysmal anxiety] without agoraphobia: Secondary | ICD-10-CM | POA: Diagnosis not present

## 2021-05-10 DIAGNOSIS — M25561 Pain in right knee: Secondary | ICD-10-CM | POA: Diagnosis not present

## 2021-05-10 DIAGNOSIS — E782 Mixed hyperlipidemia: Secondary | ICD-10-CM | POA: Diagnosis not present

## 2021-05-10 DIAGNOSIS — F321 Major depressive disorder, single episode, moderate: Secondary | ICD-10-CM | POA: Diagnosis not present

## 2021-05-10 DIAGNOSIS — K219 Gastro-esophageal reflux disease without esophagitis: Secondary | ICD-10-CM | POA: Diagnosis not present

## 2021-05-10 DIAGNOSIS — G2581 Restless legs syndrome: Secondary | ICD-10-CM | POA: Diagnosis not present

## 2021-05-17 DIAGNOSIS — G629 Polyneuropathy, unspecified: Secondary | ICD-10-CM | POA: Diagnosis not present

## 2021-05-17 DIAGNOSIS — M797 Fibromyalgia: Secondary | ICD-10-CM | POA: Diagnosis not present

## 2021-05-17 DIAGNOSIS — G8324 Monoplegia of upper limb affecting left nondominant side: Secondary | ICD-10-CM | POA: Diagnosis not present

## 2021-05-17 DIAGNOSIS — Z9049 Acquired absence of other specified parts of digestive tract: Secondary | ICD-10-CM | POA: Diagnosis not present

## 2021-05-17 DIAGNOSIS — Z96652 Presence of left artificial knee joint: Secondary | ICD-10-CM | POA: Diagnosis not present

## 2021-05-17 DIAGNOSIS — G894 Chronic pain syndrome: Secondary | ICD-10-CM | POA: Diagnosis not present

## 2021-05-17 DIAGNOSIS — E1165 Type 2 diabetes mellitus with hyperglycemia: Secondary | ICD-10-CM | POA: Diagnosis not present

## 2021-05-17 DIAGNOSIS — N3281 Overactive bladder: Secondary | ICD-10-CM | POA: Diagnosis not present

## 2021-05-17 DIAGNOSIS — M25551 Pain in right hip: Secondary | ICD-10-CM | POA: Diagnosis not present

## 2021-05-17 DIAGNOSIS — I471 Supraventricular tachycardia: Secondary | ICD-10-CM | POA: Diagnosis not present

## 2021-05-17 DIAGNOSIS — I491 Atrial premature depolarization: Secondary | ICD-10-CM | POA: Diagnosis not present

## 2021-05-17 DIAGNOSIS — M159 Polyosteoarthritis, unspecified: Secondary | ICD-10-CM | POA: Diagnosis not present

## 2021-05-17 DIAGNOSIS — G2581 Restless legs syndrome: Secondary | ICD-10-CM | POA: Diagnosis not present

## 2021-05-17 DIAGNOSIS — Z6831 Body mass index (BMI) 31.0-31.9, adult: Secondary | ICD-10-CM | POA: Diagnosis not present

## 2021-05-17 DIAGNOSIS — K5901 Slow transit constipation: Secondary | ICD-10-CM | POA: Diagnosis not present

## 2021-05-17 DIAGNOSIS — G8321 Monoplegia of upper limb affecting right dominant side: Secondary | ICD-10-CM | POA: Diagnosis not present

## 2021-05-17 DIAGNOSIS — M25561 Pain in right knee: Secondary | ICD-10-CM | POA: Diagnosis not present

## 2021-05-17 DIAGNOSIS — D649 Anemia, unspecified: Secondary | ICD-10-CM | POA: Diagnosis not present

## 2021-05-17 DIAGNOSIS — F321 Major depressive disorder, single episode, moderate: Secondary | ICD-10-CM | POA: Diagnosis not present

## 2021-05-17 DIAGNOSIS — I10 Essential (primary) hypertension: Secondary | ICD-10-CM | POA: Diagnosis not present

## 2021-05-17 DIAGNOSIS — K219 Gastro-esophageal reflux disease without esophagitis: Secondary | ICD-10-CM | POA: Diagnosis not present

## 2021-05-17 DIAGNOSIS — E782 Mixed hyperlipidemia: Secondary | ICD-10-CM | POA: Diagnosis not present

## 2021-05-17 DIAGNOSIS — F410 Panic disorder [episodic paroxysmal anxiety] without agoraphobia: Secondary | ICD-10-CM | POA: Diagnosis not present

## 2021-05-20 DIAGNOSIS — M797 Fibromyalgia: Secondary | ICD-10-CM | POA: Diagnosis not present

## 2021-05-20 DIAGNOSIS — N390 Urinary tract infection, site not specified: Secondary | ICD-10-CM | POA: Diagnosis not present

## 2021-05-20 DIAGNOSIS — R262 Difficulty in walking, not elsewhere classified: Secondary | ICD-10-CM | POA: Diagnosis not present

## 2021-05-20 DIAGNOSIS — M6259 Muscle wasting and atrophy, not elsewhere classified, multiple sites: Secondary | ICD-10-CM | POA: Diagnosis not present

## 2021-05-20 DIAGNOSIS — M4712 Other spondylosis with myelopathy, cervical region: Secondary | ICD-10-CM | POA: Diagnosis not present

## 2021-05-20 DIAGNOSIS — E1142 Type 2 diabetes mellitus with diabetic polyneuropathy: Secondary | ICD-10-CM | POA: Diagnosis not present

## 2021-05-21 ENCOUNTER — Other Ambulatory Visit: Payer: Self-pay | Admitting: Physical Medicine and Rehabilitation

## 2021-05-21 DIAGNOSIS — M797 Fibromyalgia: Secondary | ICD-10-CM

## 2021-05-23 DIAGNOSIS — E1165 Type 2 diabetes mellitus with hyperglycemia: Secondary | ICD-10-CM | POA: Diagnosis not present

## 2021-05-23 DIAGNOSIS — E782 Mixed hyperlipidemia: Secondary | ICD-10-CM | POA: Diagnosis not present

## 2021-05-23 DIAGNOSIS — K5901 Slow transit constipation: Secondary | ICD-10-CM | POA: Diagnosis not present

## 2021-05-23 DIAGNOSIS — N390 Urinary tract infection, site not specified: Secondary | ICD-10-CM | POA: Diagnosis not present

## 2021-05-25 DIAGNOSIS — G2581 Restless legs syndrome: Secondary | ICD-10-CM | POA: Diagnosis not present

## 2021-05-25 DIAGNOSIS — G629 Polyneuropathy, unspecified: Secondary | ICD-10-CM | POA: Diagnosis not present

## 2021-05-25 DIAGNOSIS — E782 Mixed hyperlipidemia: Secondary | ICD-10-CM | POA: Diagnosis not present

## 2021-05-25 DIAGNOSIS — K219 Gastro-esophageal reflux disease without esophagitis: Secondary | ICD-10-CM | POA: Diagnosis not present

## 2021-05-25 DIAGNOSIS — I491 Atrial premature depolarization: Secondary | ICD-10-CM | POA: Diagnosis not present

## 2021-05-25 DIAGNOSIS — F410 Panic disorder [episodic paroxysmal anxiety] without agoraphobia: Secondary | ICD-10-CM | POA: Diagnosis not present

## 2021-05-25 DIAGNOSIS — M25561 Pain in right knee: Secondary | ICD-10-CM | POA: Diagnosis not present

## 2021-05-25 DIAGNOSIS — G8324 Monoplegia of upper limb affecting left nondominant side: Secondary | ICD-10-CM | POA: Diagnosis not present

## 2021-05-25 DIAGNOSIS — M159 Polyosteoarthritis, unspecified: Secondary | ICD-10-CM | POA: Diagnosis not present

## 2021-05-25 DIAGNOSIS — Z96652 Presence of left artificial knee joint: Secondary | ICD-10-CM | POA: Diagnosis not present

## 2021-05-25 DIAGNOSIS — Z6831 Body mass index (BMI) 31.0-31.9, adult: Secondary | ICD-10-CM | POA: Diagnosis not present

## 2021-05-25 DIAGNOSIS — M797 Fibromyalgia: Secondary | ICD-10-CM | POA: Diagnosis not present

## 2021-05-25 DIAGNOSIS — F321 Major depressive disorder, single episode, moderate: Secondary | ICD-10-CM | POA: Diagnosis not present

## 2021-05-25 DIAGNOSIS — M25551 Pain in right hip: Secondary | ICD-10-CM | POA: Diagnosis not present

## 2021-05-25 DIAGNOSIS — G894 Chronic pain syndrome: Secondary | ICD-10-CM | POA: Diagnosis not present

## 2021-05-25 DIAGNOSIS — N3281 Overactive bladder: Secondary | ICD-10-CM | POA: Diagnosis not present

## 2021-05-25 DIAGNOSIS — I471 Supraventricular tachycardia: Secondary | ICD-10-CM | POA: Diagnosis not present

## 2021-05-25 DIAGNOSIS — E1165 Type 2 diabetes mellitus with hyperglycemia: Secondary | ICD-10-CM | POA: Diagnosis not present

## 2021-05-25 DIAGNOSIS — K5901 Slow transit constipation: Secondary | ICD-10-CM | POA: Diagnosis not present

## 2021-05-25 DIAGNOSIS — Z9049 Acquired absence of other specified parts of digestive tract: Secondary | ICD-10-CM | POA: Diagnosis not present

## 2021-05-25 DIAGNOSIS — D649 Anemia, unspecified: Secondary | ICD-10-CM | POA: Diagnosis not present

## 2021-05-25 DIAGNOSIS — G8321 Monoplegia of upper limb affecting right dominant side: Secondary | ICD-10-CM | POA: Diagnosis not present

## 2021-05-25 DIAGNOSIS — I10 Essential (primary) hypertension: Secondary | ICD-10-CM | POA: Diagnosis not present

## 2021-05-26 DIAGNOSIS — M797 Fibromyalgia: Secondary | ICD-10-CM | POA: Diagnosis not present

## 2021-05-26 DIAGNOSIS — I471 Supraventricular tachycardia: Secondary | ICD-10-CM | POA: Diagnosis not present

## 2021-05-26 DIAGNOSIS — I10 Essential (primary) hypertension: Secondary | ICD-10-CM | POA: Diagnosis not present

## 2021-05-26 DIAGNOSIS — M25551 Pain in right hip: Secondary | ICD-10-CM | POA: Diagnosis not present

## 2021-05-26 DIAGNOSIS — G629 Polyneuropathy, unspecified: Secondary | ICD-10-CM | POA: Diagnosis not present

## 2021-05-26 DIAGNOSIS — I491 Atrial premature depolarization: Secondary | ICD-10-CM | POA: Diagnosis not present

## 2021-05-26 DIAGNOSIS — G894 Chronic pain syndrome: Secondary | ICD-10-CM | POA: Diagnosis not present

## 2021-05-26 DIAGNOSIS — G2581 Restless legs syndrome: Secondary | ICD-10-CM | POA: Diagnosis not present

## 2021-05-26 DIAGNOSIS — Z96652 Presence of left artificial knee joint: Secondary | ICD-10-CM | POA: Diagnosis not present

## 2021-05-26 DIAGNOSIS — N3281 Overactive bladder: Secondary | ICD-10-CM | POA: Diagnosis not present

## 2021-05-26 DIAGNOSIS — E782 Mixed hyperlipidemia: Secondary | ICD-10-CM | POA: Diagnosis not present

## 2021-05-26 DIAGNOSIS — K219 Gastro-esophageal reflux disease without esophagitis: Secondary | ICD-10-CM | POA: Diagnosis not present

## 2021-05-26 DIAGNOSIS — F410 Panic disorder [episodic paroxysmal anxiety] without agoraphobia: Secondary | ICD-10-CM | POA: Diagnosis not present

## 2021-05-26 DIAGNOSIS — M159 Polyosteoarthritis, unspecified: Secondary | ICD-10-CM | POA: Diagnosis not present

## 2021-05-26 DIAGNOSIS — D649 Anemia, unspecified: Secondary | ICD-10-CM | POA: Diagnosis not present

## 2021-05-26 DIAGNOSIS — G8324 Monoplegia of upper limb affecting left nondominant side: Secondary | ICD-10-CM | POA: Diagnosis not present

## 2021-05-26 DIAGNOSIS — Z6831 Body mass index (BMI) 31.0-31.9, adult: Secondary | ICD-10-CM | POA: Diagnosis not present

## 2021-05-26 DIAGNOSIS — F321 Major depressive disorder, single episode, moderate: Secondary | ICD-10-CM | POA: Diagnosis not present

## 2021-05-26 DIAGNOSIS — E1165 Type 2 diabetes mellitus with hyperglycemia: Secondary | ICD-10-CM | POA: Diagnosis not present

## 2021-05-26 DIAGNOSIS — K5901 Slow transit constipation: Secondary | ICD-10-CM | POA: Diagnosis not present

## 2021-05-26 DIAGNOSIS — Z9049 Acquired absence of other specified parts of digestive tract: Secondary | ICD-10-CM | POA: Diagnosis not present

## 2021-05-26 DIAGNOSIS — M25561 Pain in right knee: Secondary | ICD-10-CM | POA: Diagnosis not present

## 2021-05-26 DIAGNOSIS — G8321 Monoplegia of upper limb affecting right dominant side: Secondary | ICD-10-CM | POA: Diagnosis not present

## 2021-05-27 DIAGNOSIS — M25551 Pain in right hip: Secondary | ICD-10-CM | POA: Diagnosis not present

## 2021-05-27 DIAGNOSIS — G8324 Monoplegia of upper limb affecting left nondominant side: Secondary | ICD-10-CM | POA: Diagnosis not present

## 2021-05-27 DIAGNOSIS — Z9049 Acquired absence of other specified parts of digestive tract: Secondary | ICD-10-CM | POA: Diagnosis not present

## 2021-05-27 DIAGNOSIS — I471 Supraventricular tachycardia: Secondary | ICD-10-CM | POA: Diagnosis not present

## 2021-05-27 DIAGNOSIS — M159 Polyosteoarthritis, unspecified: Secondary | ICD-10-CM | POA: Diagnosis not present

## 2021-05-27 DIAGNOSIS — E782 Mixed hyperlipidemia: Secondary | ICD-10-CM | POA: Diagnosis not present

## 2021-05-27 DIAGNOSIS — M25561 Pain in right knee: Secondary | ICD-10-CM | POA: Diagnosis not present

## 2021-05-27 DIAGNOSIS — M797 Fibromyalgia: Secondary | ICD-10-CM | POA: Diagnosis not present

## 2021-05-27 DIAGNOSIS — G2581 Restless legs syndrome: Secondary | ICD-10-CM | POA: Diagnosis not present

## 2021-05-27 DIAGNOSIS — E1165 Type 2 diabetes mellitus with hyperglycemia: Secondary | ICD-10-CM | POA: Diagnosis not present

## 2021-05-27 DIAGNOSIS — D649 Anemia, unspecified: Secondary | ICD-10-CM | POA: Diagnosis not present

## 2021-05-27 DIAGNOSIS — G629 Polyneuropathy, unspecified: Secondary | ICD-10-CM | POA: Diagnosis not present

## 2021-05-27 DIAGNOSIS — Z96652 Presence of left artificial knee joint: Secondary | ICD-10-CM | POA: Diagnosis not present

## 2021-05-27 DIAGNOSIS — K5901 Slow transit constipation: Secondary | ICD-10-CM | POA: Diagnosis not present

## 2021-05-27 DIAGNOSIS — F321 Major depressive disorder, single episode, moderate: Secondary | ICD-10-CM | POA: Diagnosis not present

## 2021-05-27 DIAGNOSIS — I10 Essential (primary) hypertension: Secondary | ICD-10-CM | POA: Diagnosis not present

## 2021-05-27 DIAGNOSIS — G8321 Monoplegia of upper limb affecting right dominant side: Secondary | ICD-10-CM | POA: Diagnosis not present

## 2021-05-27 DIAGNOSIS — K219 Gastro-esophageal reflux disease without esophagitis: Secondary | ICD-10-CM | POA: Diagnosis not present

## 2021-05-27 DIAGNOSIS — G894 Chronic pain syndrome: Secondary | ICD-10-CM | POA: Diagnosis not present

## 2021-05-27 DIAGNOSIS — I491 Atrial premature depolarization: Secondary | ICD-10-CM | POA: Diagnosis not present

## 2021-05-27 DIAGNOSIS — N3281 Overactive bladder: Secondary | ICD-10-CM | POA: Diagnosis not present

## 2021-05-27 DIAGNOSIS — F410 Panic disorder [episodic paroxysmal anxiety] without agoraphobia: Secondary | ICD-10-CM | POA: Diagnosis not present

## 2021-05-27 DIAGNOSIS — Z6831 Body mass index (BMI) 31.0-31.9, adult: Secondary | ICD-10-CM | POA: Diagnosis not present

## 2021-05-30 DIAGNOSIS — M25551 Pain in right hip: Secondary | ICD-10-CM | POA: Diagnosis not present

## 2021-05-30 DIAGNOSIS — I10 Essential (primary) hypertension: Secondary | ICD-10-CM | POA: Diagnosis not present

## 2021-05-30 DIAGNOSIS — E782 Mixed hyperlipidemia: Secondary | ICD-10-CM | POA: Diagnosis not present

## 2021-05-30 DIAGNOSIS — G629 Polyneuropathy, unspecified: Secondary | ICD-10-CM | POA: Diagnosis not present

## 2021-05-30 DIAGNOSIS — I471 Supraventricular tachycardia: Secondary | ICD-10-CM | POA: Diagnosis not present

## 2021-05-30 DIAGNOSIS — G8324 Monoplegia of upper limb affecting left nondominant side: Secondary | ICD-10-CM | POA: Diagnosis not present

## 2021-05-30 DIAGNOSIS — N3281 Overactive bladder: Secondary | ICD-10-CM | POA: Diagnosis not present

## 2021-05-30 DIAGNOSIS — G2581 Restless legs syndrome: Secondary | ICD-10-CM | POA: Diagnosis not present

## 2021-05-30 DIAGNOSIS — F410 Panic disorder [episodic paroxysmal anxiety] without agoraphobia: Secondary | ICD-10-CM | POA: Diagnosis not present

## 2021-05-30 DIAGNOSIS — Z6831 Body mass index (BMI) 31.0-31.9, adult: Secondary | ICD-10-CM | POA: Diagnosis not present

## 2021-05-30 DIAGNOSIS — Z9049 Acquired absence of other specified parts of digestive tract: Secondary | ICD-10-CM | POA: Diagnosis not present

## 2021-05-30 DIAGNOSIS — K5901 Slow transit constipation: Secondary | ICD-10-CM | POA: Diagnosis not present

## 2021-05-30 DIAGNOSIS — Z96652 Presence of left artificial knee joint: Secondary | ICD-10-CM | POA: Diagnosis not present

## 2021-05-30 DIAGNOSIS — I491 Atrial premature depolarization: Secondary | ICD-10-CM | POA: Diagnosis not present

## 2021-05-30 DIAGNOSIS — K219 Gastro-esophageal reflux disease without esophagitis: Secondary | ICD-10-CM | POA: Diagnosis not present

## 2021-05-30 DIAGNOSIS — M159 Polyosteoarthritis, unspecified: Secondary | ICD-10-CM | POA: Diagnosis not present

## 2021-05-30 DIAGNOSIS — D649 Anemia, unspecified: Secondary | ICD-10-CM | POA: Diagnosis not present

## 2021-05-30 DIAGNOSIS — M25561 Pain in right knee: Secondary | ICD-10-CM | POA: Diagnosis not present

## 2021-05-30 DIAGNOSIS — G8321 Monoplegia of upper limb affecting right dominant side: Secondary | ICD-10-CM | POA: Diagnosis not present

## 2021-05-30 DIAGNOSIS — M797 Fibromyalgia: Secondary | ICD-10-CM | POA: Diagnosis not present

## 2021-05-30 DIAGNOSIS — F321 Major depressive disorder, single episode, moderate: Secondary | ICD-10-CM | POA: Diagnosis not present

## 2021-05-30 DIAGNOSIS — G894 Chronic pain syndrome: Secondary | ICD-10-CM | POA: Diagnosis not present

## 2021-05-30 DIAGNOSIS — E1165 Type 2 diabetes mellitus with hyperglycemia: Secondary | ICD-10-CM | POA: Diagnosis not present

## 2021-06-01 DIAGNOSIS — I491 Atrial premature depolarization: Secondary | ICD-10-CM | POA: Diagnosis not present

## 2021-06-01 DIAGNOSIS — M797 Fibromyalgia: Secondary | ICD-10-CM | POA: Diagnosis not present

## 2021-06-01 DIAGNOSIS — K219 Gastro-esophageal reflux disease without esophagitis: Secondary | ICD-10-CM | POA: Diagnosis not present

## 2021-06-01 DIAGNOSIS — G8321 Monoplegia of upper limb affecting right dominant side: Secondary | ICD-10-CM | POA: Diagnosis not present

## 2021-06-01 DIAGNOSIS — K5901 Slow transit constipation: Secondary | ICD-10-CM | POA: Diagnosis not present

## 2021-06-01 DIAGNOSIS — Z6831 Body mass index (BMI) 31.0-31.9, adult: Secondary | ICD-10-CM | POA: Diagnosis not present

## 2021-06-01 DIAGNOSIS — G8324 Monoplegia of upper limb affecting left nondominant side: Secondary | ICD-10-CM | POA: Diagnosis not present

## 2021-06-01 DIAGNOSIS — G894 Chronic pain syndrome: Secondary | ICD-10-CM | POA: Diagnosis not present

## 2021-06-01 DIAGNOSIS — M25561 Pain in right knee: Secondary | ICD-10-CM | POA: Diagnosis not present

## 2021-06-01 DIAGNOSIS — G629 Polyneuropathy, unspecified: Secondary | ICD-10-CM | POA: Diagnosis not present

## 2021-06-01 DIAGNOSIS — Z9049 Acquired absence of other specified parts of digestive tract: Secondary | ICD-10-CM | POA: Diagnosis not present

## 2021-06-01 DIAGNOSIS — F321 Major depressive disorder, single episode, moderate: Secondary | ICD-10-CM | POA: Diagnosis not present

## 2021-06-01 DIAGNOSIS — I10 Essential (primary) hypertension: Secondary | ICD-10-CM | POA: Diagnosis not present

## 2021-06-01 DIAGNOSIS — D649 Anemia, unspecified: Secondary | ICD-10-CM | POA: Diagnosis not present

## 2021-06-01 DIAGNOSIS — M159 Polyosteoarthritis, unspecified: Secondary | ICD-10-CM | POA: Diagnosis not present

## 2021-06-01 DIAGNOSIS — M25551 Pain in right hip: Secondary | ICD-10-CM | POA: Diagnosis not present

## 2021-06-01 DIAGNOSIS — G2581 Restless legs syndrome: Secondary | ICD-10-CM | POA: Diagnosis not present

## 2021-06-01 DIAGNOSIS — E1165 Type 2 diabetes mellitus with hyperglycemia: Secondary | ICD-10-CM | POA: Diagnosis not present

## 2021-06-01 DIAGNOSIS — I471 Supraventricular tachycardia: Secondary | ICD-10-CM | POA: Diagnosis not present

## 2021-06-01 DIAGNOSIS — Z96652 Presence of left artificial knee joint: Secondary | ICD-10-CM | POA: Diagnosis not present

## 2021-06-01 DIAGNOSIS — E782 Mixed hyperlipidemia: Secondary | ICD-10-CM | POA: Diagnosis not present

## 2021-06-01 DIAGNOSIS — N3281 Overactive bladder: Secondary | ICD-10-CM | POA: Diagnosis not present

## 2021-06-01 DIAGNOSIS — F410 Panic disorder [episodic paroxysmal anxiety] without agoraphobia: Secondary | ICD-10-CM | POA: Diagnosis not present

## 2021-06-02 DIAGNOSIS — M797 Fibromyalgia: Secondary | ICD-10-CM | POA: Diagnosis not present

## 2021-06-02 DIAGNOSIS — I471 Supraventricular tachycardia: Secondary | ICD-10-CM | POA: Diagnosis not present

## 2021-06-02 DIAGNOSIS — Z794 Long term (current) use of insulin: Secondary | ICD-10-CM | POA: Diagnosis not present

## 2021-06-02 DIAGNOSIS — F331 Major depressive disorder, recurrent, moderate: Secondary | ICD-10-CM | POA: Diagnosis not present

## 2021-06-02 DIAGNOSIS — R300 Dysuria: Secondary | ICD-10-CM | POA: Diagnosis not present

## 2021-06-02 DIAGNOSIS — E1165 Type 2 diabetes mellitus with hyperglycemia: Secondary | ICD-10-CM | POA: Diagnosis not present

## 2021-06-07 DIAGNOSIS — F331 Major depressive disorder, recurrent, moderate: Secondary | ICD-10-CM | POA: Diagnosis not present

## 2021-06-07 DIAGNOSIS — E1165 Type 2 diabetes mellitus with hyperglycemia: Secondary | ICD-10-CM | POA: Diagnosis not present

## 2021-06-08 DIAGNOSIS — G894 Chronic pain syndrome: Secondary | ICD-10-CM | POA: Diagnosis not present

## 2021-06-08 DIAGNOSIS — G8324 Monoplegia of upper limb affecting left nondominant side: Secondary | ICD-10-CM | POA: Diagnosis not present

## 2021-06-08 DIAGNOSIS — Z96652 Presence of left artificial knee joint: Secondary | ICD-10-CM | POA: Diagnosis not present

## 2021-06-08 DIAGNOSIS — G629 Polyneuropathy, unspecified: Secondary | ICD-10-CM | POA: Diagnosis not present

## 2021-06-08 DIAGNOSIS — K219 Gastro-esophageal reflux disease without esophagitis: Secondary | ICD-10-CM | POA: Diagnosis not present

## 2021-06-08 DIAGNOSIS — F410 Panic disorder [episodic paroxysmal anxiety] without agoraphobia: Secondary | ICD-10-CM | POA: Diagnosis not present

## 2021-06-08 DIAGNOSIS — Z6831 Body mass index (BMI) 31.0-31.9, adult: Secondary | ICD-10-CM | POA: Diagnosis not present

## 2021-06-08 DIAGNOSIS — G2581 Restless legs syndrome: Secondary | ICD-10-CM | POA: Diagnosis not present

## 2021-06-08 DIAGNOSIS — E1165 Type 2 diabetes mellitus with hyperglycemia: Secondary | ICD-10-CM | POA: Diagnosis not present

## 2021-06-08 DIAGNOSIS — E782 Mixed hyperlipidemia: Secondary | ICD-10-CM | POA: Diagnosis not present

## 2021-06-08 DIAGNOSIS — F321 Major depressive disorder, single episode, moderate: Secondary | ICD-10-CM | POA: Diagnosis not present

## 2021-06-08 DIAGNOSIS — G8321 Monoplegia of upper limb affecting right dominant side: Secondary | ICD-10-CM | POA: Diagnosis not present

## 2021-06-08 DIAGNOSIS — K5901 Slow transit constipation: Secondary | ICD-10-CM | POA: Diagnosis not present

## 2021-06-08 DIAGNOSIS — D649 Anemia, unspecified: Secondary | ICD-10-CM | POA: Diagnosis not present

## 2021-06-08 DIAGNOSIS — I471 Supraventricular tachycardia: Secondary | ICD-10-CM | POA: Diagnosis not present

## 2021-06-08 DIAGNOSIS — M797 Fibromyalgia: Secondary | ICD-10-CM | POA: Diagnosis not present

## 2021-06-08 DIAGNOSIS — I491 Atrial premature depolarization: Secondary | ICD-10-CM | POA: Diagnosis not present

## 2021-06-08 DIAGNOSIS — M25561 Pain in right knee: Secondary | ICD-10-CM | POA: Diagnosis not present

## 2021-06-08 DIAGNOSIS — M159 Polyosteoarthritis, unspecified: Secondary | ICD-10-CM | POA: Diagnosis not present

## 2021-06-08 DIAGNOSIS — N3281 Overactive bladder: Secondary | ICD-10-CM | POA: Diagnosis not present

## 2021-06-08 DIAGNOSIS — I10 Essential (primary) hypertension: Secondary | ICD-10-CM | POA: Diagnosis not present

## 2021-06-08 DIAGNOSIS — M25551 Pain in right hip: Secondary | ICD-10-CM | POA: Diagnosis not present

## 2021-06-08 DIAGNOSIS — Z9049 Acquired absence of other specified parts of digestive tract: Secondary | ICD-10-CM | POA: Diagnosis not present

## 2021-06-09 DIAGNOSIS — N390 Urinary tract infection, site not specified: Secondary | ICD-10-CM | POA: Diagnosis not present

## 2021-06-09 DIAGNOSIS — F331 Major depressive disorder, recurrent, moderate: Secondary | ICD-10-CM | POA: Diagnosis not present

## 2021-06-09 DIAGNOSIS — E1165 Type 2 diabetes mellitus with hyperglycemia: Secondary | ICD-10-CM | POA: Diagnosis not present

## 2021-06-10 DIAGNOSIS — E1165 Type 2 diabetes mellitus with hyperglycemia: Secondary | ICD-10-CM | POA: Diagnosis not present

## 2021-06-10 DIAGNOSIS — F331 Major depressive disorder, recurrent, moderate: Secondary | ICD-10-CM | POA: Diagnosis not present

## 2021-06-10 DIAGNOSIS — N390 Urinary tract infection, site not specified: Secondary | ICD-10-CM | POA: Diagnosis not present

## 2021-06-13 DIAGNOSIS — I471 Supraventricular tachycardia: Secondary | ICD-10-CM | POA: Diagnosis not present

## 2021-06-13 DIAGNOSIS — N3281 Overactive bladder: Secondary | ICD-10-CM | POA: Diagnosis not present

## 2021-06-13 DIAGNOSIS — Z6831 Body mass index (BMI) 31.0-31.9, adult: Secondary | ICD-10-CM | POA: Diagnosis not present

## 2021-06-13 DIAGNOSIS — Z96652 Presence of left artificial knee joint: Secondary | ICD-10-CM | POA: Diagnosis not present

## 2021-06-13 DIAGNOSIS — K5901 Slow transit constipation: Secondary | ICD-10-CM | POA: Diagnosis not present

## 2021-06-13 DIAGNOSIS — G894 Chronic pain syndrome: Secondary | ICD-10-CM | POA: Diagnosis not present

## 2021-06-13 DIAGNOSIS — G8321 Monoplegia of upper limb affecting right dominant side: Secondary | ICD-10-CM | POA: Diagnosis not present

## 2021-06-13 DIAGNOSIS — M25551 Pain in right hip: Secondary | ICD-10-CM | POA: Diagnosis not present

## 2021-06-13 DIAGNOSIS — E782 Mixed hyperlipidemia: Secondary | ICD-10-CM | POA: Diagnosis not present

## 2021-06-13 DIAGNOSIS — G2581 Restless legs syndrome: Secondary | ICD-10-CM | POA: Diagnosis not present

## 2021-06-13 DIAGNOSIS — F321 Major depressive disorder, single episode, moderate: Secondary | ICD-10-CM | POA: Diagnosis not present

## 2021-06-13 DIAGNOSIS — I10 Essential (primary) hypertension: Secondary | ICD-10-CM | POA: Diagnosis not present

## 2021-06-13 DIAGNOSIS — M797 Fibromyalgia: Secondary | ICD-10-CM | POA: Diagnosis not present

## 2021-06-13 DIAGNOSIS — M25561 Pain in right knee: Secondary | ICD-10-CM | POA: Diagnosis not present

## 2021-06-13 DIAGNOSIS — I491 Atrial premature depolarization: Secondary | ICD-10-CM | POA: Diagnosis not present

## 2021-06-13 DIAGNOSIS — G8324 Monoplegia of upper limb affecting left nondominant side: Secondary | ICD-10-CM | POA: Diagnosis not present

## 2021-06-13 DIAGNOSIS — D649 Anemia, unspecified: Secondary | ICD-10-CM | POA: Diagnosis not present

## 2021-06-13 DIAGNOSIS — M159 Polyosteoarthritis, unspecified: Secondary | ICD-10-CM | POA: Diagnosis not present

## 2021-06-13 DIAGNOSIS — G629 Polyneuropathy, unspecified: Secondary | ICD-10-CM | POA: Diagnosis not present

## 2021-06-13 DIAGNOSIS — Z9049 Acquired absence of other specified parts of digestive tract: Secondary | ICD-10-CM | POA: Diagnosis not present

## 2021-06-13 DIAGNOSIS — E1165 Type 2 diabetes mellitus with hyperglycemia: Secondary | ICD-10-CM | POA: Diagnosis not present

## 2021-06-13 DIAGNOSIS — F410 Panic disorder [episodic paroxysmal anxiety] without agoraphobia: Secondary | ICD-10-CM | POA: Diagnosis not present

## 2021-06-13 DIAGNOSIS — K219 Gastro-esophageal reflux disease without esophagitis: Secondary | ICD-10-CM | POA: Diagnosis not present

## 2021-06-15 DIAGNOSIS — Z6836 Body mass index (BMI) 36.0-36.9, adult: Secondary | ICD-10-CM | POA: Diagnosis not present

## 2021-06-15 DIAGNOSIS — E1165 Type 2 diabetes mellitus with hyperglycemia: Secondary | ICD-10-CM | POA: Diagnosis not present

## 2021-06-15 DIAGNOSIS — N390 Urinary tract infection, site not specified: Secondary | ICD-10-CM | POA: Diagnosis not present

## 2021-06-15 DIAGNOSIS — E669 Obesity, unspecified: Secondary | ICD-10-CM | POA: Diagnosis not present

## 2021-06-15 DIAGNOSIS — M797 Fibromyalgia: Secondary | ICD-10-CM | POA: Diagnosis not present

## 2021-06-19 DIAGNOSIS — E1142 Type 2 diabetes mellitus with diabetic polyneuropathy: Secondary | ICD-10-CM | POA: Diagnosis not present

## 2021-06-19 DIAGNOSIS — R262 Difficulty in walking, not elsewhere classified: Secondary | ICD-10-CM | POA: Diagnosis not present

## 2021-06-19 DIAGNOSIS — M4712 Other spondylosis with myelopathy, cervical region: Secondary | ICD-10-CM | POA: Diagnosis not present

## 2021-06-19 DIAGNOSIS — M797 Fibromyalgia: Secondary | ICD-10-CM | POA: Diagnosis not present

## 2021-06-19 DIAGNOSIS — M6259 Muscle wasting and atrophy, not elsewhere classified, multiple sites: Secondary | ICD-10-CM | POA: Diagnosis not present

## 2021-06-21 DIAGNOSIS — G629 Polyneuropathy, unspecified: Secondary | ICD-10-CM | POA: Diagnosis not present

## 2021-06-21 DIAGNOSIS — Z96652 Presence of left artificial knee joint: Secondary | ICD-10-CM | POA: Diagnosis not present

## 2021-06-21 DIAGNOSIS — G894 Chronic pain syndrome: Secondary | ICD-10-CM | POA: Diagnosis not present

## 2021-06-21 DIAGNOSIS — E119 Type 2 diabetes mellitus without complications: Secondary | ICD-10-CM | POA: Diagnosis not present

## 2021-06-21 DIAGNOSIS — G2581 Restless legs syndrome: Secondary | ICD-10-CM | POA: Diagnosis not present

## 2021-06-21 DIAGNOSIS — E782 Mixed hyperlipidemia: Secondary | ICD-10-CM | POA: Diagnosis not present

## 2021-06-21 DIAGNOSIS — Z515 Encounter for palliative care: Secondary | ICD-10-CM | POA: Diagnosis not present

## 2021-06-21 DIAGNOSIS — K219 Gastro-esophageal reflux disease without esophagitis: Secondary | ICD-10-CM | POA: Diagnosis not present

## 2021-06-21 DIAGNOSIS — M25551 Pain in right hip: Secondary | ICD-10-CM | POA: Diagnosis not present

## 2021-06-21 DIAGNOSIS — I471 Supraventricular tachycardia: Secondary | ICD-10-CM | POA: Diagnosis not present

## 2021-06-21 DIAGNOSIS — K5901 Slow transit constipation: Secondary | ICD-10-CM | POA: Diagnosis not present

## 2021-06-21 DIAGNOSIS — F410 Panic disorder [episodic paroxysmal anxiety] without agoraphobia: Secondary | ICD-10-CM | POA: Diagnosis not present

## 2021-06-21 DIAGNOSIS — I491 Atrial premature depolarization: Secondary | ICD-10-CM | POA: Diagnosis not present

## 2021-06-21 DIAGNOSIS — I10 Essential (primary) hypertension: Secondary | ICD-10-CM | POA: Diagnosis not present

## 2021-06-21 DIAGNOSIS — F321 Major depressive disorder, single episode, moderate: Secondary | ICD-10-CM | POA: Diagnosis not present

## 2021-06-21 DIAGNOSIS — Z9049 Acquired absence of other specified parts of digestive tract: Secondary | ICD-10-CM | POA: Diagnosis not present

## 2021-06-21 DIAGNOSIS — M797 Fibromyalgia: Secondary | ICD-10-CM | POA: Diagnosis not present

## 2021-06-21 DIAGNOSIS — E1165 Type 2 diabetes mellitus with hyperglycemia: Secondary | ICD-10-CM | POA: Diagnosis not present

## 2021-06-21 DIAGNOSIS — N3281 Overactive bladder: Secondary | ICD-10-CM | POA: Diagnosis not present

## 2021-06-21 DIAGNOSIS — D649 Anemia, unspecified: Secondary | ICD-10-CM | POA: Diagnosis not present

## 2021-06-21 DIAGNOSIS — M159 Polyosteoarthritis, unspecified: Secondary | ICD-10-CM | POA: Diagnosis not present

## 2021-06-21 DIAGNOSIS — M25561 Pain in right knee: Secondary | ICD-10-CM | POA: Diagnosis not present

## 2021-06-21 DIAGNOSIS — Z6831 Body mass index (BMI) 31.0-31.9, adult: Secondary | ICD-10-CM | POA: Diagnosis not present

## 2021-06-21 DIAGNOSIS — Z794 Long term (current) use of insulin: Secondary | ICD-10-CM | POA: Diagnosis not present

## 2021-06-21 DIAGNOSIS — K5900 Constipation, unspecified: Secondary | ICD-10-CM | POA: Diagnosis not present

## 2021-06-21 DIAGNOSIS — G8324 Monoplegia of upper limb affecting left nondominant side: Secondary | ICD-10-CM | POA: Diagnosis not present

## 2021-06-21 DIAGNOSIS — G8321 Monoplegia of upper limb affecting right dominant side: Secondary | ICD-10-CM | POA: Diagnosis not present

## 2021-06-21 DIAGNOSIS — H1131 Conjunctival hemorrhage, right eye: Secondary | ICD-10-CM | POA: Diagnosis not present

## 2021-06-22 DIAGNOSIS — F410 Panic disorder [episodic paroxysmal anxiety] without agoraphobia: Secondary | ICD-10-CM | POA: Diagnosis not present

## 2021-06-22 DIAGNOSIS — G629 Polyneuropathy, unspecified: Secondary | ICD-10-CM | POA: Diagnosis not present

## 2021-06-22 DIAGNOSIS — N3281 Overactive bladder: Secondary | ICD-10-CM | POA: Diagnosis not present

## 2021-06-22 DIAGNOSIS — E1165 Type 2 diabetes mellitus with hyperglycemia: Secondary | ICD-10-CM | POA: Diagnosis not present

## 2021-06-22 DIAGNOSIS — E782 Mixed hyperlipidemia: Secondary | ICD-10-CM | POA: Diagnosis not present

## 2021-06-22 DIAGNOSIS — M25551 Pain in right hip: Secondary | ICD-10-CM | POA: Diagnosis not present

## 2021-06-22 DIAGNOSIS — Z96652 Presence of left artificial knee joint: Secondary | ICD-10-CM | POA: Diagnosis not present

## 2021-06-22 DIAGNOSIS — G8324 Monoplegia of upper limb affecting left nondominant side: Secondary | ICD-10-CM | POA: Diagnosis not present

## 2021-06-22 DIAGNOSIS — F321 Major depressive disorder, single episode, moderate: Secondary | ICD-10-CM | POA: Diagnosis not present

## 2021-06-22 DIAGNOSIS — K219 Gastro-esophageal reflux disease without esophagitis: Secondary | ICD-10-CM | POA: Diagnosis not present

## 2021-06-22 DIAGNOSIS — Z6831 Body mass index (BMI) 31.0-31.9, adult: Secondary | ICD-10-CM | POA: Diagnosis not present

## 2021-06-22 DIAGNOSIS — I491 Atrial premature depolarization: Secondary | ICD-10-CM | POA: Diagnosis not present

## 2021-06-22 DIAGNOSIS — Z9049 Acquired absence of other specified parts of digestive tract: Secondary | ICD-10-CM | POA: Diagnosis not present

## 2021-06-22 DIAGNOSIS — D649 Anemia, unspecified: Secondary | ICD-10-CM | POA: Diagnosis not present

## 2021-06-22 DIAGNOSIS — M797 Fibromyalgia: Secondary | ICD-10-CM | POA: Diagnosis not present

## 2021-06-22 DIAGNOSIS — I10 Essential (primary) hypertension: Secondary | ICD-10-CM | POA: Diagnosis not present

## 2021-06-22 DIAGNOSIS — I471 Supraventricular tachycardia: Secondary | ICD-10-CM | POA: Diagnosis not present

## 2021-06-22 DIAGNOSIS — K5901 Slow transit constipation: Secondary | ICD-10-CM | POA: Diagnosis not present

## 2021-06-22 DIAGNOSIS — M25561 Pain in right knee: Secondary | ICD-10-CM | POA: Diagnosis not present

## 2021-06-22 DIAGNOSIS — G2581 Restless legs syndrome: Secondary | ICD-10-CM | POA: Diagnosis not present

## 2021-06-22 DIAGNOSIS — M159 Polyosteoarthritis, unspecified: Secondary | ICD-10-CM | POA: Diagnosis not present

## 2021-06-22 DIAGNOSIS — G8321 Monoplegia of upper limb affecting right dominant side: Secondary | ICD-10-CM | POA: Diagnosis not present

## 2021-06-22 DIAGNOSIS — G894 Chronic pain syndrome: Secondary | ICD-10-CM | POA: Diagnosis not present

## 2021-06-23 DIAGNOSIS — F331 Major depressive disorder, recurrent, moderate: Secondary | ICD-10-CM | POA: Diagnosis not present

## 2021-06-23 DIAGNOSIS — E1165 Type 2 diabetes mellitus with hyperglycemia: Secondary | ICD-10-CM | POA: Diagnosis not present

## 2021-06-23 DIAGNOSIS — Z794 Long term (current) use of insulin: Secondary | ICD-10-CM | POA: Diagnosis not present

## 2021-06-23 DIAGNOSIS — Z87440 Personal history of urinary (tract) infections: Secondary | ICD-10-CM | POA: Diagnosis not present

## 2021-06-28 DIAGNOSIS — F410 Panic disorder [episodic paroxysmal anxiety] without agoraphobia: Secondary | ICD-10-CM | POA: Diagnosis not present

## 2021-06-28 DIAGNOSIS — G8324 Monoplegia of upper limb affecting left nondominant side: Secondary | ICD-10-CM | POA: Diagnosis not present

## 2021-06-28 DIAGNOSIS — G8321 Monoplegia of upper limb affecting right dominant side: Secondary | ICD-10-CM | POA: Diagnosis not present

## 2021-06-28 DIAGNOSIS — D649 Anemia, unspecified: Secondary | ICD-10-CM | POA: Diagnosis not present

## 2021-06-28 DIAGNOSIS — K5901 Slow transit constipation: Secondary | ICD-10-CM | POA: Diagnosis not present

## 2021-06-28 DIAGNOSIS — M159 Polyosteoarthritis, unspecified: Secondary | ICD-10-CM | POA: Diagnosis not present

## 2021-06-28 DIAGNOSIS — Z9049 Acquired absence of other specified parts of digestive tract: Secondary | ICD-10-CM | POA: Diagnosis not present

## 2021-06-28 DIAGNOSIS — G894 Chronic pain syndrome: Secondary | ICD-10-CM | POA: Diagnosis not present

## 2021-06-28 DIAGNOSIS — M25561 Pain in right knee: Secondary | ICD-10-CM | POA: Diagnosis not present

## 2021-06-28 DIAGNOSIS — G2581 Restless legs syndrome: Secondary | ICD-10-CM | POA: Diagnosis not present

## 2021-06-28 DIAGNOSIS — N3281 Overactive bladder: Secondary | ICD-10-CM | POA: Diagnosis not present

## 2021-06-28 DIAGNOSIS — M797 Fibromyalgia: Secondary | ICD-10-CM | POA: Diagnosis not present

## 2021-06-28 DIAGNOSIS — E1165 Type 2 diabetes mellitus with hyperglycemia: Secondary | ICD-10-CM | POA: Diagnosis not present

## 2021-06-28 DIAGNOSIS — M25551 Pain in right hip: Secondary | ICD-10-CM | POA: Diagnosis not present

## 2021-06-28 DIAGNOSIS — I10 Essential (primary) hypertension: Secondary | ICD-10-CM | POA: Diagnosis not present

## 2021-06-28 DIAGNOSIS — K219 Gastro-esophageal reflux disease without esophagitis: Secondary | ICD-10-CM | POA: Diagnosis not present

## 2021-06-28 DIAGNOSIS — Z96652 Presence of left artificial knee joint: Secondary | ICD-10-CM | POA: Diagnosis not present

## 2021-06-28 DIAGNOSIS — Z6831 Body mass index (BMI) 31.0-31.9, adult: Secondary | ICD-10-CM | POA: Diagnosis not present

## 2021-06-28 DIAGNOSIS — I471 Supraventricular tachycardia: Secondary | ICD-10-CM | POA: Diagnosis not present

## 2021-06-28 DIAGNOSIS — E782 Mixed hyperlipidemia: Secondary | ICD-10-CM | POA: Diagnosis not present

## 2021-06-28 DIAGNOSIS — G629 Polyneuropathy, unspecified: Secondary | ICD-10-CM | POA: Diagnosis not present

## 2021-06-28 DIAGNOSIS — F321 Major depressive disorder, single episode, moderate: Secondary | ICD-10-CM | POA: Diagnosis not present

## 2021-06-28 DIAGNOSIS — I491 Atrial premature depolarization: Secondary | ICD-10-CM | POA: Diagnosis not present

## 2021-06-30 DIAGNOSIS — Z794 Long term (current) use of insulin: Secondary | ICD-10-CM | POA: Diagnosis not present

## 2021-06-30 DIAGNOSIS — F331 Major depressive disorder, recurrent, moderate: Secondary | ICD-10-CM | POA: Diagnosis not present

## 2021-06-30 DIAGNOSIS — E1165 Type 2 diabetes mellitus with hyperglycemia: Secondary | ICD-10-CM | POA: Diagnosis not present

## 2021-07-04 DIAGNOSIS — M25551 Pain in right hip: Secondary | ICD-10-CM | POA: Diagnosis not present

## 2021-07-04 DIAGNOSIS — D649 Anemia, unspecified: Secondary | ICD-10-CM | POA: Diagnosis not present

## 2021-07-04 DIAGNOSIS — K5901 Slow transit constipation: Secondary | ICD-10-CM | POA: Diagnosis not present

## 2021-07-04 DIAGNOSIS — Z9049 Acquired absence of other specified parts of digestive tract: Secondary | ICD-10-CM | POA: Diagnosis not present

## 2021-07-04 DIAGNOSIS — I10 Essential (primary) hypertension: Secondary | ICD-10-CM | POA: Diagnosis not present

## 2021-07-04 DIAGNOSIS — I491 Atrial premature depolarization: Secondary | ICD-10-CM | POA: Diagnosis not present

## 2021-07-04 DIAGNOSIS — Z6831 Body mass index (BMI) 31.0-31.9, adult: Secondary | ICD-10-CM | POA: Diagnosis not present

## 2021-07-04 DIAGNOSIS — Z96652 Presence of left artificial knee joint: Secondary | ICD-10-CM | POA: Diagnosis not present

## 2021-07-04 DIAGNOSIS — G629 Polyneuropathy, unspecified: Secondary | ICD-10-CM | POA: Diagnosis not present

## 2021-07-04 DIAGNOSIS — G2581 Restless legs syndrome: Secondary | ICD-10-CM | POA: Diagnosis not present

## 2021-07-04 DIAGNOSIS — E782 Mixed hyperlipidemia: Secondary | ICD-10-CM | POA: Diagnosis not present

## 2021-07-04 DIAGNOSIS — F321 Major depressive disorder, single episode, moderate: Secondary | ICD-10-CM | POA: Diagnosis not present

## 2021-07-04 DIAGNOSIS — F410 Panic disorder [episodic paroxysmal anxiety] without agoraphobia: Secondary | ICD-10-CM | POA: Diagnosis not present

## 2021-07-04 DIAGNOSIS — G894 Chronic pain syndrome: Secondary | ICD-10-CM | POA: Diagnosis not present

## 2021-07-04 DIAGNOSIS — E1165 Type 2 diabetes mellitus with hyperglycemia: Secondary | ICD-10-CM | POA: Diagnosis not present

## 2021-07-04 DIAGNOSIS — K219 Gastro-esophageal reflux disease without esophagitis: Secondary | ICD-10-CM | POA: Diagnosis not present

## 2021-07-04 DIAGNOSIS — M159 Polyosteoarthritis, unspecified: Secondary | ICD-10-CM | POA: Diagnosis not present

## 2021-07-04 DIAGNOSIS — I471 Supraventricular tachycardia: Secondary | ICD-10-CM | POA: Diagnosis not present

## 2021-07-04 DIAGNOSIS — G8324 Monoplegia of upper limb affecting left nondominant side: Secondary | ICD-10-CM | POA: Diagnosis not present

## 2021-07-04 DIAGNOSIS — M25561 Pain in right knee: Secondary | ICD-10-CM | POA: Diagnosis not present

## 2021-07-04 DIAGNOSIS — G8321 Monoplegia of upper limb affecting right dominant side: Secondary | ICD-10-CM | POA: Diagnosis not present

## 2021-07-04 DIAGNOSIS — M797 Fibromyalgia: Secondary | ICD-10-CM | POA: Diagnosis not present

## 2021-07-04 DIAGNOSIS — N3281 Overactive bladder: Secondary | ICD-10-CM | POA: Diagnosis not present

## 2021-07-05 DIAGNOSIS — M797 Fibromyalgia: Secondary | ICD-10-CM | POA: Diagnosis not present

## 2021-07-08 DIAGNOSIS — K5900 Constipation, unspecified: Secondary | ICD-10-CM | POA: Diagnosis not present

## 2021-07-08 DIAGNOSIS — R5383 Other fatigue: Secondary | ICD-10-CM | POA: Diagnosis not present

## 2021-07-08 DIAGNOSIS — F331 Major depressive disorder, recurrent, moderate: Secondary | ICD-10-CM | POA: Diagnosis not present

## 2021-07-08 DIAGNOSIS — E1165 Type 2 diabetes mellitus with hyperglycemia: Secondary | ICD-10-CM | POA: Diagnosis not present

## 2021-07-13 DIAGNOSIS — E1165 Type 2 diabetes mellitus with hyperglycemia: Secondary | ICD-10-CM | POA: Diagnosis not present

## 2021-07-13 DIAGNOSIS — F331 Major depressive disorder, recurrent, moderate: Secondary | ICD-10-CM | POA: Diagnosis not present

## 2021-07-14 DIAGNOSIS — E1165 Type 2 diabetes mellitus with hyperglycemia: Secondary | ICD-10-CM | POA: Diagnosis not present

## 2021-07-14 DIAGNOSIS — K5901 Slow transit constipation: Secondary | ICD-10-CM | POA: Diagnosis not present

## 2021-07-14 DIAGNOSIS — M797 Fibromyalgia: Secondary | ICD-10-CM | POA: Diagnosis not present

## 2021-07-14 DIAGNOSIS — G2581 Restless legs syndrome: Secondary | ICD-10-CM | POA: Diagnosis not present

## 2021-07-14 DIAGNOSIS — M25551 Pain in right hip: Secondary | ICD-10-CM | POA: Diagnosis not present

## 2021-07-14 DIAGNOSIS — K219 Gastro-esophageal reflux disease without esophagitis: Secondary | ICD-10-CM | POA: Diagnosis not present

## 2021-07-14 DIAGNOSIS — M25561 Pain in right knee: Secondary | ICD-10-CM | POA: Diagnosis not present

## 2021-07-14 DIAGNOSIS — I491 Atrial premature depolarization: Secondary | ICD-10-CM | POA: Diagnosis not present

## 2021-07-14 DIAGNOSIS — Z96652 Presence of left artificial knee joint: Secondary | ICD-10-CM | POA: Diagnosis not present

## 2021-07-14 DIAGNOSIS — G8321 Monoplegia of upper limb affecting right dominant side: Secondary | ICD-10-CM | POA: Diagnosis not present

## 2021-07-14 DIAGNOSIS — G629 Polyneuropathy, unspecified: Secondary | ICD-10-CM | POA: Diagnosis not present

## 2021-07-14 DIAGNOSIS — M159 Polyosteoarthritis, unspecified: Secondary | ICD-10-CM | POA: Diagnosis not present

## 2021-07-14 DIAGNOSIS — I10 Essential (primary) hypertension: Secondary | ICD-10-CM | POA: Diagnosis not present

## 2021-07-14 DIAGNOSIS — Z6831 Body mass index (BMI) 31.0-31.9, adult: Secondary | ICD-10-CM | POA: Diagnosis not present

## 2021-07-14 DIAGNOSIS — F410 Panic disorder [episodic paroxysmal anxiety] without agoraphobia: Secondary | ICD-10-CM | POA: Diagnosis not present

## 2021-07-14 DIAGNOSIS — I471 Supraventricular tachycardia: Secondary | ICD-10-CM | POA: Diagnosis not present

## 2021-07-14 DIAGNOSIS — G8324 Monoplegia of upper limb affecting left nondominant side: Secondary | ICD-10-CM | POA: Diagnosis not present

## 2021-07-14 DIAGNOSIS — F321 Major depressive disorder, single episode, moderate: Secondary | ICD-10-CM | POA: Diagnosis not present

## 2021-07-14 DIAGNOSIS — G894 Chronic pain syndrome: Secondary | ICD-10-CM | POA: Diagnosis not present

## 2021-07-14 DIAGNOSIS — N3281 Overactive bladder: Secondary | ICD-10-CM | POA: Diagnosis not present

## 2021-07-14 DIAGNOSIS — Z9049 Acquired absence of other specified parts of digestive tract: Secondary | ICD-10-CM | POA: Diagnosis not present

## 2021-07-14 DIAGNOSIS — D649 Anemia, unspecified: Secondary | ICD-10-CM | POA: Diagnosis not present

## 2021-07-14 DIAGNOSIS — E782 Mixed hyperlipidemia: Secondary | ICD-10-CM | POA: Diagnosis not present

## 2021-07-20 DIAGNOSIS — E1142 Type 2 diabetes mellitus with diabetic polyneuropathy: Secondary | ICD-10-CM | POA: Diagnosis not present

## 2021-07-20 DIAGNOSIS — M6259 Muscle wasting and atrophy, not elsewhere classified, multiple sites: Secondary | ICD-10-CM | POA: Diagnosis not present

## 2021-07-20 DIAGNOSIS — R262 Difficulty in walking, not elsewhere classified: Secondary | ICD-10-CM | POA: Diagnosis not present

## 2021-07-20 DIAGNOSIS — M797 Fibromyalgia: Secondary | ICD-10-CM | POA: Diagnosis not present

## 2021-07-20 DIAGNOSIS — M4712 Other spondylosis with myelopathy, cervical region: Secondary | ICD-10-CM | POA: Diagnosis not present

## 2021-07-21 DIAGNOSIS — I10 Essential (primary) hypertension: Secondary | ICD-10-CM | POA: Diagnosis not present

## 2021-07-21 DIAGNOSIS — F331 Major depressive disorder, recurrent, moderate: Secondary | ICD-10-CM | POA: Diagnosis not present

## 2021-07-21 DIAGNOSIS — Z794 Long term (current) use of insulin: Secondary | ICD-10-CM | POA: Diagnosis not present

## 2021-07-21 DIAGNOSIS — M797 Fibromyalgia: Secondary | ICD-10-CM | POA: Diagnosis not present

## 2021-07-21 DIAGNOSIS — E1165 Type 2 diabetes mellitus with hyperglycemia: Secondary | ICD-10-CM | POA: Diagnosis not present

## 2021-08-03 DIAGNOSIS — G8321 Monoplegia of upper limb affecting right dominant side: Secondary | ICD-10-CM | POA: Diagnosis not present

## 2021-08-03 DIAGNOSIS — F321 Major depressive disorder, single episode, moderate: Secondary | ICD-10-CM | POA: Diagnosis not present

## 2021-08-03 DIAGNOSIS — M25561 Pain in right knee: Secondary | ICD-10-CM | POA: Diagnosis not present

## 2021-08-03 DIAGNOSIS — G2581 Restless legs syndrome: Secondary | ICD-10-CM | POA: Diagnosis not present

## 2021-08-03 DIAGNOSIS — G8324 Monoplegia of upper limb affecting left nondominant side: Secondary | ICD-10-CM | POA: Diagnosis not present

## 2021-08-03 DIAGNOSIS — I491 Atrial premature depolarization: Secondary | ICD-10-CM | POA: Diagnosis not present

## 2021-08-03 DIAGNOSIS — K5901 Slow transit constipation: Secondary | ICD-10-CM | POA: Diagnosis not present

## 2021-08-03 DIAGNOSIS — E782 Mixed hyperlipidemia: Secondary | ICD-10-CM | POA: Diagnosis not present

## 2021-08-03 DIAGNOSIS — I471 Supraventricular tachycardia: Secondary | ICD-10-CM | POA: Diagnosis not present

## 2021-08-03 DIAGNOSIS — M797 Fibromyalgia: Secondary | ICD-10-CM | POA: Diagnosis not present

## 2021-08-03 DIAGNOSIS — Z9049 Acquired absence of other specified parts of digestive tract: Secondary | ICD-10-CM | POA: Diagnosis not present

## 2021-08-03 DIAGNOSIS — G629 Polyneuropathy, unspecified: Secondary | ICD-10-CM | POA: Diagnosis not present

## 2021-08-03 DIAGNOSIS — N3281 Overactive bladder: Secondary | ICD-10-CM | POA: Diagnosis not present

## 2021-08-03 DIAGNOSIS — I10 Essential (primary) hypertension: Secondary | ICD-10-CM | POA: Diagnosis not present

## 2021-08-03 DIAGNOSIS — Z6831 Body mass index (BMI) 31.0-31.9, adult: Secondary | ICD-10-CM | POA: Diagnosis not present

## 2021-08-03 DIAGNOSIS — K219 Gastro-esophageal reflux disease without esophagitis: Secondary | ICD-10-CM | POA: Diagnosis not present

## 2021-08-03 DIAGNOSIS — G894 Chronic pain syndrome: Secondary | ICD-10-CM | POA: Diagnosis not present

## 2021-08-03 DIAGNOSIS — M25551 Pain in right hip: Secondary | ICD-10-CM | POA: Diagnosis not present

## 2021-08-03 DIAGNOSIS — Z96652 Presence of left artificial knee joint: Secondary | ICD-10-CM | POA: Diagnosis not present

## 2021-08-03 DIAGNOSIS — F410 Panic disorder [episodic paroxysmal anxiety] without agoraphobia: Secondary | ICD-10-CM | POA: Diagnosis not present

## 2021-08-03 DIAGNOSIS — E1165 Type 2 diabetes mellitus with hyperglycemia: Secondary | ICD-10-CM | POA: Diagnosis not present

## 2021-08-03 DIAGNOSIS — M159 Polyosteoarthritis, unspecified: Secondary | ICD-10-CM | POA: Diagnosis not present

## 2021-08-03 DIAGNOSIS — D649 Anemia, unspecified: Secondary | ICD-10-CM | POA: Diagnosis not present

## 2021-08-04 ENCOUNTER — Telehealth: Payer: Self-pay

## 2021-08-04 DIAGNOSIS — Z794 Long term (current) use of insulin: Secondary | ICD-10-CM | POA: Diagnosis not present

## 2021-08-04 DIAGNOSIS — M797 Fibromyalgia: Secondary | ICD-10-CM | POA: Diagnosis not present

## 2021-08-04 DIAGNOSIS — I471 Supraventricular tachycardia: Secondary | ICD-10-CM | POA: Diagnosis not present

## 2021-08-04 DIAGNOSIS — E1165 Type 2 diabetes mellitus with hyperglycemia: Secondary | ICD-10-CM | POA: Diagnosis not present

## 2021-08-04 DIAGNOSIS — G894 Chronic pain syndrome: Secondary | ICD-10-CM | POA: Diagnosis not present

## 2021-08-04 NOTE — Telephone Encounter (Signed)
Daughter states mother is in [facility]. States her primary care has been taken over by [facility] [employer].   Daughter is appreciative of everything Dr [name] has done & wants to be sure Dr [name] knows how thankful they are.

## 2021-08-05 ENCOUNTER — Telehealth: Payer: Self-pay | Admitting: Cardiology

## 2021-08-05 DIAGNOSIS — M797 Fibromyalgia: Secondary | ICD-10-CM | POA: Diagnosis not present

## 2021-08-09 ENCOUNTER — Ambulatory Visit (INDEPENDENT_AMBULATORY_CARE_PROVIDER_SITE_OTHER): Payer: HMO | Admitting: Physician Assistant

## 2021-08-09 ENCOUNTER — Encounter: Payer: Self-pay | Admitting: Physician Assistant

## 2021-08-09 VITALS — BP 142/84 | HR 66 | Ht 63.0 in | Wt 212.0 lb

## 2021-08-09 DIAGNOSIS — I700 Atherosclerosis of aorta: Secondary | ICD-10-CM | POA: Diagnosis not present

## 2021-08-09 DIAGNOSIS — R001 Bradycardia, unspecified: Secondary | ICD-10-CM

## 2021-08-09 DIAGNOSIS — Z794 Long term (current) use of insulin: Secondary | ICD-10-CM | POA: Diagnosis not present

## 2021-08-09 DIAGNOSIS — I471 Supraventricular tachycardia: Secondary | ICD-10-CM | POA: Diagnosis not present

## 2021-08-09 DIAGNOSIS — E1165 Type 2 diabetes mellitus with hyperglycemia: Secondary | ICD-10-CM | POA: Diagnosis not present

## 2021-08-09 DIAGNOSIS — G629 Polyneuropathy, unspecified: Secondary | ICD-10-CM

## 2021-08-09 MED ORDER — VERAPAMIL HCL ER 240 MG PO CP24: 240.0000 mg | ORAL_CAPSULE | Freq: Every day | ORAL | 2 refills | Status: DC

## 2021-08-11 DIAGNOSIS — I10 Essential (primary) hypertension: Secondary | ICD-10-CM | POA: Diagnosis not present

## 2021-08-11 DIAGNOSIS — R54 Age-related physical debility: Secondary | ICD-10-CM | POA: Diagnosis not present

## 2021-08-11 DIAGNOSIS — F331 Major depressive disorder, recurrent, moderate: Secondary | ICD-10-CM | POA: Diagnosis not present

## 2021-08-11 DIAGNOSIS — E1165 Type 2 diabetes mellitus with hyperglycemia: Secondary | ICD-10-CM | POA: Diagnosis not present

## 2021-08-19 DIAGNOSIS — E1142 Type 2 diabetes mellitus with diabetic polyneuropathy: Secondary | ICD-10-CM | POA: Diagnosis not present

## 2021-08-19 DIAGNOSIS — M6259 Muscle wasting and atrophy, not elsewhere classified, multiple sites: Secondary | ICD-10-CM | POA: Diagnosis not present

## 2021-08-19 DIAGNOSIS — M4712 Other spondylosis with myelopathy, cervical region: Secondary | ICD-10-CM | POA: Diagnosis not present

## 2021-08-19 DIAGNOSIS — R262 Difficulty in walking, not elsewhere classified: Secondary | ICD-10-CM | POA: Diagnosis not present

## 2021-08-19 DIAGNOSIS — M797 Fibromyalgia: Secondary | ICD-10-CM | POA: Diagnosis not present

## 2021-08-23 DIAGNOSIS — M25511 Pain in right shoulder: Secondary | ICD-10-CM | POA: Diagnosis not present

## 2021-08-29 ENCOUNTER — Ambulatory Visit: Payer: HMO | Admitting: Podiatry

## 2021-08-31 ENCOUNTER — Ambulatory Visit (INDEPENDENT_AMBULATORY_CARE_PROVIDER_SITE_OTHER): Payer: HMO | Admitting: Podiatry

## 2021-08-31 ENCOUNTER — Encounter: Payer: Self-pay | Admitting: Podiatry

## 2021-08-31 DIAGNOSIS — B351 Tinea unguium: Secondary | ICD-10-CM | POA: Diagnosis not present

## 2021-08-31 DIAGNOSIS — M79675 Pain in left toe(s): Secondary | ICD-10-CM

## 2021-08-31 DIAGNOSIS — Z87440 Personal history of urinary (tract) infections: Secondary | ICD-10-CM | POA: Diagnosis not present

## 2021-08-31 DIAGNOSIS — G894 Chronic pain syndrome: Secondary | ICD-10-CM | POA: Diagnosis not present

## 2021-08-31 DIAGNOSIS — E1165 Type 2 diabetes mellitus with hyperglycemia: Secondary | ICD-10-CM | POA: Diagnosis not present

## 2021-08-31 DIAGNOSIS — M79674 Pain in right toe(s): Secondary | ICD-10-CM | POA: Diagnosis not present

## 2021-08-31 DIAGNOSIS — K5900 Constipation, unspecified: Secondary | ICD-10-CM | POA: Diagnosis not present

## 2021-08-31 NOTE — Progress Notes (Signed)
This patient presents to the office with chief complaint of long thick painful  big toenails.  Patient says the nails are painful walking and wearing shoes.  This patient is unable to self treat.  This patient is unable to trim her nails since she is unable to reach her nails.  She presents to the office for preventative foot care services.  General Appearance  Alert, conversant and in no acute stress.  Vascular  Dorsalis pedis and posterior tibial  pulses are  weakly palpable  bilaterally.  Capillary return is within normal limits  bilaterally. Temperature is within normal limits  bilaterally.  Neurologic  Senn-Weinstein monofilament wire test diminished  bilaterally. Muscle power within normal limits bilaterally.  Nails Thick disfigured discolored nails with subungual debris  from hallux to fifth toes bilaterally. No evidence of bacterial infection or drainage bilaterally.  Orthopedic  No limitations of motion  feet .  No crepitus or effusions noted.  No bony pathology or digital deformities noted.  Skin  normotropic skin with no porokeratosis noted bilaterally.  No signs of infections or ulcers noted.     Onychomycosis  Nails  B/L.  Pain in right toes  Pain in left toes  Debridement of nails both feet followed trimming the nails with dremel tool.    RTC 4 months.   Gardiner Barefoot DPM

## 2021-09-04 DIAGNOSIS — M797 Fibromyalgia: Secondary | ICD-10-CM | POA: Diagnosis not present

## 2021-09-13 DIAGNOSIS — M21372 Foot drop, left foot: Secondary | ICD-10-CM | POA: Diagnosis not present

## 2021-09-13 DIAGNOSIS — H1131 Conjunctival hemorrhage, right eye: Secondary | ICD-10-CM | POA: Diagnosis not present

## 2021-09-13 DIAGNOSIS — Z515 Encounter for palliative care: Secondary | ICD-10-CM | POA: Diagnosis not present

## 2021-09-13 DIAGNOSIS — E119 Type 2 diabetes mellitus without complications: Secondary | ICD-10-CM | POA: Diagnosis not present

## 2021-09-13 DIAGNOSIS — G8929 Other chronic pain: Secondary | ICD-10-CM | POA: Diagnosis not present

## 2021-09-13 DIAGNOSIS — Z794 Long term (current) use of insulin: Secondary | ICD-10-CM | POA: Diagnosis not present

## 2021-09-13 DIAGNOSIS — R2689 Other abnormalities of gait and mobility: Secondary | ICD-10-CM | POA: Diagnosis not present

## 2021-09-13 DIAGNOSIS — M21371 Foot drop, right foot: Secondary | ICD-10-CM | POA: Diagnosis not present

## 2021-09-19 DIAGNOSIS — M4712 Other spondylosis with myelopathy, cervical region: Secondary | ICD-10-CM | POA: Diagnosis not present

## 2021-09-19 DIAGNOSIS — E1142 Type 2 diabetes mellitus with diabetic polyneuropathy: Secondary | ICD-10-CM | POA: Diagnosis not present

## 2021-09-19 DIAGNOSIS — R262 Difficulty in walking, not elsewhere classified: Secondary | ICD-10-CM | POA: Diagnosis not present

## 2021-09-19 DIAGNOSIS — M6259 Muscle wasting and atrophy, not elsewhere classified, multiple sites: Secondary | ICD-10-CM | POA: Diagnosis not present

## 2021-09-19 DIAGNOSIS — M797 Fibromyalgia: Secondary | ICD-10-CM | POA: Diagnosis not present

## 2021-09-21 DIAGNOSIS — G894 Chronic pain syndrome: Secondary | ICD-10-CM | POA: Diagnosis not present

## 2021-09-21 DIAGNOSIS — K5900 Constipation, unspecified: Secondary | ICD-10-CM | POA: Diagnosis not present

## 2021-09-21 DIAGNOSIS — E1165 Type 2 diabetes mellitus with hyperglycemia: Secondary | ICD-10-CM | POA: Diagnosis not present

## 2021-09-21 DIAGNOSIS — R197 Diarrhea, unspecified: Secondary | ICD-10-CM | POA: Diagnosis not present

## 2021-09-30 DIAGNOSIS — E11649 Type 2 diabetes mellitus with hypoglycemia without coma: Secondary | ICD-10-CM | POA: Diagnosis not present

## 2021-09-30 DIAGNOSIS — Z794 Long term (current) use of insulin: Secondary | ICD-10-CM | POA: Diagnosis not present

## 2021-10-04 DIAGNOSIS — E11649 Type 2 diabetes mellitus with hypoglycemia without coma: Secondary | ICD-10-CM | POA: Diagnosis not present

## 2021-10-04 DIAGNOSIS — K5900 Constipation, unspecified: Secondary | ICD-10-CM | POA: Diagnosis not present

## 2021-10-04 DIAGNOSIS — F331 Major depressive disorder, recurrent, moderate: Secondary | ICD-10-CM | POA: Diagnosis not present

## 2021-10-04 DIAGNOSIS — Z794 Long term (current) use of insulin: Secondary | ICD-10-CM | POA: Diagnosis not present

## 2021-10-05 DIAGNOSIS — M797 Fibromyalgia: Secondary | ICD-10-CM | POA: Diagnosis not present

## 2021-10-11 DIAGNOSIS — R54 Age-related physical debility: Secondary | ICD-10-CM | POA: Diagnosis not present

## 2021-10-11 DIAGNOSIS — M797 Fibromyalgia: Secondary | ICD-10-CM | POA: Diagnosis not present

## 2021-10-11 DIAGNOSIS — G894 Chronic pain syndrome: Secondary | ICD-10-CM | POA: Diagnosis not present

## 2021-10-11 DIAGNOSIS — F419 Anxiety disorder, unspecified: Secondary | ICD-10-CM | POA: Diagnosis not present

## 2021-10-11 DIAGNOSIS — F331 Major depressive disorder, recurrent, moderate: Secondary | ICD-10-CM | POA: Diagnosis not present

## 2021-10-11 DIAGNOSIS — I10 Essential (primary) hypertension: Secondary | ICD-10-CM | POA: Diagnosis not present

## 2021-10-11 DIAGNOSIS — E1165 Type 2 diabetes mellitus with hyperglycemia: Secondary | ICD-10-CM | POA: Diagnosis not present

## 2021-10-11 DIAGNOSIS — E11649 Type 2 diabetes mellitus with hypoglycemia without coma: Secondary | ICD-10-CM | POA: Diagnosis not present

## 2021-10-20 DIAGNOSIS — M6259 Muscle wasting and atrophy, not elsewhere classified, multiple sites: Secondary | ICD-10-CM | POA: Diagnosis not present

## 2021-10-20 DIAGNOSIS — M797 Fibromyalgia: Secondary | ICD-10-CM | POA: Diagnosis not present

## 2021-10-20 DIAGNOSIS — E1142 Type 2 diabetes mellitus with diabetic polyneuropathy: Secondary | ICD-10-CM | POA: Diagnosis not present

## 2021-10-20 DIAGNOSIS — M4712 Other spondylosis with myelopathy, cervical region: Secondary | ICD-10-CM | POA: Diagnosis not present

## 2021-10-20 DIAGNOSIS — R262 Difficulty in walking, not elsewhere classified: Secondary | ICD-10-CM | POA: Diagnosis not present

## 2021-10-21 DIAGNOSIS — Z6836 Body mass index (BMI) 36.0-36.9, adult: Secondary | ICD-10-CM | POA: Diagnosis not present

## 2021-10-21 DIAGNOSIS — E669 Obesity, unspecified: Secondary | ICD-10-CM | POA: Diagnosis not present

## 2021-10-21 DIAGNOSIS — R296 Repeated falls: Secondary | ICD-10-CM | POA: Diagnosis not present

## 2021-10-21 DIAGNOSIS — M797 Fibromyalgia: Secondary | ICD-10-CM | POA: Diagnosis not present

## 2021-10-21 DIAGNOSIS — M6281 Muscle weakness (generalized): Secondary | ICD-10-CM | POA: Diagnosis not present

## 2021-10-21 DIAGNOSIS — F331 Major depressive disorder, recurrent, moderate: Secondary | ICD-10-CM | POA: Diagnosis not present

## 2021-11-05 DIAGNOSIS — M797 Fibromyalgia: Secondary | ICD-10-CM | POA: Diagnosis not present

## 2021-11-10 DIAGNOSIS — E1165 Type 2 diabetes mellitus with hyperglycemia: Secondary | ICD-10-CM | POA: Diagnosis not present

## 2021-11-10 DIAGNOSIS — G894 Chronic pain syndrome: Secondary | ICD-10-CM | POA: Diagnosis not present

## 2021-11-10 DIAGNOSIS — M797 Fibromyalgia: Secondary | ICD-10-CM | POA: Diagnosis not present

## 2021-11-10 DIAGNOSIS — F419 Anxiety disorder, unspecified: Secondary | ICD-10-CM | POA: Diagnosis not present

## 2021-11-10 DIAGNOSIS — R54 Age-related physical debility: Secondary | ICD-10-CM | POA: Diagnosis not present

## 2021-11-10 DIAGNOSIS — F331 Major depressive disorder, recurrent, moderate: Secondary | ICD-10-CM | POA: Diagnosis not present

## 2021-11-10 DIAGNOSIS — I10 Essential (primary) hypertension: Secondary | ICD-10-CM | POA: Diagnosis not present

## 2021-11-19 DIAGNOSIS — R262 Difficulty in walking, not elsewhere classified: Secondary | ICD-10-CM | POA: Diagnosis not present

## 2021-11-19 DIAGNOSIS — M797 Fibromyalgia: Secondary | ICD-10-CM | POA: Diagnosis not present

## 2021-11-19 DIAGNOSIS — M6259 Muscle wasting and atrophy, not elsewhere classified, multiple sites: Secondary | ICD-10-CM | POA: Diagnosis not present

## 2021-11-19 DIAGNOSIS — M4712 Other spondylosis with myelopathy, cervical region: Secondary | ICD-10-CM | POA: Diagnosis not present

## 2021-11-19 DIAGNOSIS — E1142 Type 2 diabetes mellitus with diabetic polyneuropathy: Secondary | ICD-10-CM | POA: Diagnosis not present

## 2021-11-28 ENCOUNTER — Ambulatory Visit: Payer: HMO | Attending: Cardiology | Admitting: Cardiology

## 2021-11-28 ENCOUNTER — Encounter: Payer: Self-pay | Admitting: Cardiology

## 2021-11-28 VITALS — BP 110/70 | HR 64 | Ht 63.0 in | Wt 222.0 lb

## 2021-11-28 DIAGNOSIS — I700 Atherosclerosis of aorta: Secondary | ICD-10-CM

## 2021-11-28 DIAGNOSIS — I2510 Atherosclerotic heart disease of native coronary artery without angina pectoris: Secondary | ICD-10-CM | POA: Diagnosis not present

## 2021-11-28 DIAGNOSIS — I471 Supraventricular tachycardia, unspecified: Secondary | ICD-10-CM

## 2021-11-28 DIAGNOSIS — I10 Essential (primary) hypertension: Secondary | ICD-10-CM

## 2021-11-28 DIAGNOSIS — G629 Polyneuropathy, unspecified: Secondary | ICD-10-CM | POA: Diagnosis not present

## 2021-11-28 DIAGNOSIS — I2584 Coronary atherosclerosis due to calcified coronary lesion: Secondary | ICD-10-CM

## 2021-11-28 NOTE — Patient Instructions (Signed)
Medication Instructions:  The current medical regimen is effective;  continue present plan and medications.  *If you need a refill on your cardiac medications before your next appointment, please call your pharmacy*  Follow-Up: At [facility], you and your health needs are our priority.  As part of our continuing mission to provide you with exceptional heart care, we have created designated Provider Care Teams.  These Care Teams include your primary Cardiologist (physician) and Advanced Practice Providers (APPs -  Physician Assistants and Nurse Practitioners) who all work together to provide you with the care you need, when you need it.  We recommend signing up for the patient portal called "MyChart".  Sign up information is provided on this After Visit Summary.  MyChart is used to connect with patients for Virtual Visits (Telemedicine).  Patients are able to view lab/test results, encounter notes, upcoming appointments, etc.  Non-urgent messages can be sent to your provider as well.   To learn more about what you can do with MyChart, go to [email or web address].    Your next appointment:   1 year(s)  The format for your next appointment:   In Person  Provider:   Mark Skains, MD      Important Information About Sugar       

## 2021-11-28 NOTE — Progress Notes (Signed)
Cardiology Office Note:    Date:  11/28/2021   ID:  CACI ORREN, DOB Sep 13, 1940, MRN [id number]  PCP:  [facility]   [facility] Providers Cardiologist:  Candee Furbish, MD     Referring MD: [facility],*    History of Present Illness:    LUCI BELLUCCI is a 81 y.o. female here for the follow up of hypertension and PSVT.  Her daughter had called in 6/23 saying the patient was tired, weak, and HR in 50's. She had a headache and jaw hurt around to the back of her head when her HR was in the 50's. BP was ok 126. It was noted she hadn't had digoxin since Friday. Labs drawn 06/15/21 reviewed by PCP. A1C 8.0 in May. She followed up with Ermalinda Barrios, PA-C on 08/09/2021. Her digoxin was discontinued; continued on metoprolol and verapamil (can hold for HR less than 60).  Previously here for follow-up of SVT, hypertension, hyperlipidemia, type 2 diabetes with neuropathy, fibromyalgia.  Dr. Wynonia Lawman used to see her previously, last in 2019.  Her SVT has been managed medically with verapamil.  Has had anxiety, diabetes in the past.  Cardiac catheterization in 2017 showed normal coronary arteries.  Her troponin was minimally elevated at the time.  Feels left hand is weakening. Wound healed. Dr. [name].   At her last appointment she reported needing to relearn how to walk twice because of severe back issues.  Occasionally she felt some palpitations but this was very rare.  Today: She is accompanied by her daughter. Overall, she states she has been feeling better since June when she was experiencing significant fatigue and weakness.  No issues with chest pain, racing heart rates.  Has been in pain for many years she states.  She is very thankful she is at [employer].  Had a bad experience previously at [facility].  She denies any palpitations, chest pain, shortness of breath, or peripheral edema. No lightheadedness, headaches, syncope, orthopnea, or PND.   Past  Medical History:  Diagnosis Date   Anemia    during 1st pregnancy   Anxiety    Arthritis    Cancer ([place])    Basal - face   Complication of anesthesia    Depression    Diarrhea, functional    Dysrhythmia    Paroysmal Atrial Tachycardia   Essential hypertension    Fibromyalgia    GERD (gastroesophageal reflux disease)    H/O acute pancreatitis    Headache    History of hiatal hernia    Hyperlipemia    Neuropathy    Osteoporosis    Panic attacks    Paralysis (HCC) 01/06/2019   hands   PAT (paroxysmal atrial tachycardia)    Pneumonia    PONV (postoperative nausea and vomiting)    PSVT (paroxysmal supraventricular tachycardia)    Reflux    Restless leg syndrome    Spinal cord compression (HCC)    Type 2 diabetes mellitus (HCC)    Type II   Vertigo    not current    Past Surgical History:  Procedure Laterality Date   ABDOMINAL HYSTERECTOMY     ANKLE SURGERY Left    x 2   ANTERIOR CERVICAL DECOMP/DISCECTOMY FUSION N/A 11/12/2017   Procedure: Cervical seven to Thoracic one  Anterior cervical decompression/discectomy/fusion with removal of old plate;  Surgeon: [name], MD;  Location: [place];  Service: Neurosurgery;  Laterality: N/A;   BACK SURGERY     T12-L1  fusion, Anterior Cervical fusion   CARDIAC CATHETERIZATION N/A 02/16/2015   Procedure: Left Heart Cath and Coronary Angiography;  Surgeon: Sherren Mocha, MD;  Location: [facility];  Service: Cardiovascular;  Laterality: N/A;   CHOLECYSTECTOMY     COLONOSCOPY     ESOPHAGOGASTRODUODENOSCOPY  12/21/2010   Procedure: ESOPHAGOGASTRODUODENOSCOPY (EGD);  Surgeon: Landry Dyke, MD;  Location: [facility];  Service: Endoscopy;  Laterality: N/A;   EXCISION MORTON'S NEUROMA Right    EYE SURGERY Bilateral    Cataract   FRACTURE SURGERY     ankle x 2    KNEE SURGERY Right    x 3- arthroscopy   NECK SURGERY     POSTERIOR CERVICAL FUSION/FORAMINOTOMY N/A 01/22/2018   Procedure: Cervical six-seven Posterior  Decompression with Posterior Fixation Cervical Five to Thoracic Two;  Surgeon: [name], MD;  Location: [place];  Service: Neurosurgery;  Laterality: N/A;  Cervical 6-7 Posteror decompression with posterior fixation Cervical 5 to Thoracic 2   ROTATOR CUFF REPAIR Right    TONSILLECTOMY     TOTAL KNEE ARTHROPLASTY Left 04/10/2017   Procedure: LEFT TOTAL KNEE ARTHROPLASTY;  Surgeon: Paralee Cancel, MD;  Location: [facility];  Service: Orthopedics;  Laterality: Left;  70 mins   UPPER GASTROINTESTINAL ENDOSCOPY     WRIST SURGERY Left    fracture    Current Medications: Current Meds  Medication Sig   acetaminophen (TYLENOL) 325 MG tablet Take 325 mg by mouth every 6 (six) hours as needed.   aspirin EC 81 MG tablet Take 81 mg by mouth at bedtime.    docusate sodium (COLACE) 100 MG capsule Take 100 mg by mouth daily.   DULoxetine (CYMBALTA) 60 MG capsule Take 1 capsule by mouth daily.   ezetimibe (ZETIA) 10 MG tablet TAKE 1 TABLET BY MOUTH EVERY DAY   insulin aspart (NOVOLOG FLEXPEN) 100 UNIT/ML FlexPen Max daily 75 units per correction scale (Patient taking differently: Inject 44 Units into the skin daily. Max daily 75 units per correction scale)   insulin glargine (LANTUS SOLOSTAR) 100 UNIT/ML Solostar Pen Inject 26 Units into the skin daily. (Patient taking differently: Inject 10 Units into the skin daily.)   Insulin Pen Needle (BD PEN NEEDLE MICRO U/F) 32G X 6 MM MISC Inject 1 Device into the skin QID. USE 4 TIMES A DAY NOVOLOG(3) AND LANTUS9(1)   LIDOCAINE PAIN RELIEF 4 % Place 1 patch onto the skin daily.   LINZESS 145 MCG CAPS capsule TAKE 1 CAPSULE BY MOUTH DAILY BEFORE BREAKFAST.   meloxicam (MOBIC) 15 MG tablet TAKE 1 TABLET (15 MG TOTAL) BY MOUTH DAILY.   metoprolol succinate (TOPROL-XL) 25 MG 24 hr tablet TAKE 1 TABLET BY MOUTH DAILY. PLEASE MAKE OVERDUE APPT WITH DR. Marlou Porch BEFORE ANYMORE REFILLS. (Patient taking differently: Take 25 mg by mouth daily.)   MYRBETRIQ 50 MG TB24 tablet TAKE 1  TABLET BY MOUTH EVERY DAY (Patient taking differently: Take 50 mg by mouth daily.)   PFIZER-BIONT COVID-19 VAC-TRIS SUSP injection    polyethylene glycol (MIRALAX / GLYCOLAX) 17 g packet Take 17 g by mouth daily as needed for mild constipation.   pregabalin (LYRICA) 75 MG capsule TAKE 1 CAPSULE BY MOUTH 3 TIMES DAILY.   RELION INSULIN SYRINGE 1ML/31G 31G X 5/16" 1 ML MISC Use as directed   rosuvastatin (CRESTOR) 20 MG tablet Take 20 mg by mouth daily.   sertraline (ZOLOFT) 50 MG tablet Take 50 mg by mouth daily.   verapamil (VERELAN PM) 240 MG 24 hr  capsule Take 1 capsule (240 mg total) by mouth daily. Hold if HR is <60   vitamin B-12 (CYANOCOBALAMIN) 1000 MCG tablet Take 1,000 mcg by mouth daily.     Allergies:   Amoxil [amoxicillin], Lisinopril, Chocolate, Amoxicillin-pot clavulanate, Demerol, and Other   Social History   Socioeconomic History   Marital status: Widowed    Spouse name: Sherre Wooton   Number of children: 2   Years of education: 12   Highest education level: 12th grade  Occupational History   Occupation: Retired  Tobacco Use   Smoking status: Former    Types: Cigarettes    Passive exposure: Past   Smokeless tobacco: Never   Tobacco comments:    smoke 1 cigarette every few weeks when the [place] lady came by.  Vaping Use   Vaping Use: Never used  Substance and Sexual Activity   Alcohol use: No   Drug use: No   Sexual activity: Not Currently  Other Topics Concern   Not on file  Social History Narrative   Lives home alone.  Daughter [name] with her today.   Independent of ADLs.  Education HS.  Widowed.     Social Determinants of Health   Financial Resource Strain: Low Risk  (12/06/2020)   Overall Financial Resource Strain (CARDIA)    Difficulty of Paying Living Expenses: Not hard at all  Food Insecurity: No Food Insecurity (12/06/2020)   Hunger Vital Sign    Worried About Running Out of Food in the Last Year: Never true    Ran Out of Food in the Last Year:  Never true  Transportation Needs: No Transportation Needs (12/06/2020)   PRAPARE - Hydrologist (Medical): No    Lack of Transportation (Non-Medical): No  Physical Activity: Insufficiently Active (12/06/2020)   Exercise Vital Sign    Days of Exercise per Week: 5 days    Minutes of Exercise per Session: 20 min  Stress: No Stress Concern Present (12/06/2020)   [place]    Feeling of Stress : Only a little  Recent Concern: Stress - Stress Concern Present (11/17/2020)   [place]    Feeling of Stress : Rather much  Social Connections: Moderately Isolated (12/06/2020)   Social Connection and Isolation Panel [NHANES]    Frequency of Communication with Friends and Family: More than three times a week    Frequency of Social Gatherings with Friends and Family: More than three times a week    Attends Religious Services: 1 to 4 times per year    Active Member of [employer] or Organizations: No    Attends Archivist Meetings: Never    Marital Status: Widowed     Family History: The patient's family history includes Heart failure in her mother; Hyperlipidemia in her maternal grandmother and mother; Lung cancer in her father; Peripheral vascular disease in her maternal grandmother.  ROS:   Please see the history of present illness.    All other systems reviewed and are negative.  EKGs/Labs/Other Studies Reviewed:    The following studies were reviewed today:  ABI Doppler  06/30/2020: Summary:  Right: Resting right ankle-brachial index is within normal range. No  evidence of significant right lower extremity arterial disease. The right  toe-brachial index is normal.   Left: Resting left ankle-brachial index is within normal range. No  evidence of significant left lower extremity arterial disease. The left  toe-brachial index  is normal.   Bilateral LE Venous Doppler  09/18/2019: Summary:  RIGHT:  - No evidence of deep vein thrombosis in the lower extremity. No indirect  evidence of obstruction proximal to the inguinal ligament.  - No cystic structure found in the popliteal fossa.     LEFT:  - No evidence of deep vein thrombosis in the lower extremity. No indirect  evidence of obstruction proximal to the inguinal ligament.  - No cystic structure found in the popliteal fossa.   Echo  12/25/2017: Study Conclusions  - Left ventricle: The cavity size was normal. Systolic function was    normal. The estimated ejection fraction was in the range of 60%    to 65%. Wall motion was normal; there were no regional wall    motion abnormalities. Left ventricular diastolic function    parameters were normal for the patient&'s age.  - Aortic valve: Trileaflet; mildly thickened, mildly calcified    leaflets. Left coronary cusp predominanly affected by    calcification. Sclerosis without stenosis. There was no    regurgitation.  - Mitral valve: There was no significant regurgitation.  - Right ventricle: Systolic function was normal.  - Atrial septum: No defect or patent foramen ovale was identified.  - Tricuspid valve: There was no significant regurgitation.  - Pulmonic valve: There was no significant regurgitation.   Impressions:  - Normal LV function. No cardiac source of embolism. Aortic valve    sclerosis without stenosis.   Cardiac catheterization 2017: Widely patent coronary arteries without significant stenoses Normal LV function   Suspect elevated troponin related to demand ischemia in setting of atrial tachycardia. There is no significant coronary obstruction.    EKG:  EKG is personally reviewed. 11/28/2021:  EKG was not ordered. 08/09/2021 Ermalinda Barrios, PA-C): NSR with poor R wave progression anteriorly 11/23/2020: sinus rhythm 61 nonspecific ST-T wave changes leftward axis, no significant change from  prior  Recent Labs: 01/26/2021: ALT 17 02/18/2021: BUN 29; Creatinine, Ser 1.52; Hemoglobin 13.8; Platelets 181; Potassium 4.3; Sodium 133   Recent Lipid Panel    Component Value Date/Time   CHOL 148 12/12/2019 0922   TRIG 414 (H) 12/12/2019 0922   HDL 30 (L) 12/12/2019 0922   CHOLHDL 4.9 12/12/2019 0922   VLDL 73 (H) 02/15/2015 0514   LDLCALC  12/12/2019 4481     Comment:     . LDL cholesterol not calculated. Triglyceride levels greater than 400 mg/dL invalidate calculated LDL results. . Reference range: <100 . Desirable range <100 mg/dL for primary prevention;   <70 mg/dL for patients with CHD or diabetic patients  with > or = 2 CHD risk factors. Marland Kitchen LDL-C is now calculated using the Martin-Hopkins  calculation, which is a validated novel method providing  better accuracy than the Friedewald equation in the  estimation of LDL-C.  Cresenciano Genre et al. Annamaria Helling. 8563;149(70): 2061-2068  (http://education.[employer].com/faq/FAQ164)      Risk Assessment/Calculations:          Physical Exam:    VS:  BP 110/70 (BP Location: Left Arm, Patient Position: Sitting, Cuff Size: Normal)   Pulse 64   Ht '5\' 3"'$  (1.6 m)   Wt 222 lb (100.7 kg)   SpO2 98%   BMI 39.33 kg/m     Wt Readings from Last 3 Encounters:  11/28/21 222 lb (100.7 kg)  08/09/21 212 lb (96.2 kg)  02/15/21 203 lb (92.1 kg)     GEN:  Well nourished, well developed in no acute  distress; In wheelchair. HEENT: Normal NECK: No JVD; No carotid bruits LYMPHATICS: No lymphadenopathy CARDIAC: RRR, No murmurs, rubs, gallops RESPIRATORY:  Clear to auscultation without rales, wheezing or rhonchi  ABDOMEN: Soft, non-tender, non-distended MUSCULOSKELETAL:  No edema; bilateral hand contractures noted SKIN: Warm and dry NEUROLOGIC:  Alert and oriented x 3 PSYCHIATRIC:  Normal affect   ASSESSMENT:    1. Coronary artery calcification   2. Aortic atherosclerosis ([place])   3. SVT (supraventricular tachycardia)   4.  Peripheral polyneuropathy   5. Essential hypertension, benign     PLAN:    In order of problems listed above:  SVT (supraventricular tachycardia) (HCC) Overall doing well with metoprolol and verapamil.  Digoxin was stopped at a prior visit.  She is doing very well.  She has been on this regimen (minus digoxin) for many years.  No changes made in medical management.  Very rarely will she feel any palpitations.   Aortic atherosclerosis (HCC) Continue with Crestor 10 mg once a day.  Zetia as well.   Peripheral neuropathy She has had multiple back/neck surgeries.  Wound healing issues with low back.  Dr. [name] was her surgeon.  At 1 point did not think she would be able to walk again.  She has been doing exceptionally well.  Utilizes a walker.  Her hands are getting weaker however.   Coronary artery calcification Scattered coronary artery calcifications noted on prior CT scan.  Prior catheterization showed no obstructive disease.  Continue with Crestor, Zetia 10 mg.  Blood pressure control.     Follow-up:  1 year.  Medication Adjustments/Labs and Tests Ordered: Current medicines are reviewed at length with the patient today.  Concerns regarding medicines are outlined above.   No orders of the defined types were placed in this encounter.  No orders of the defined types were placed in this encounter.  Patient Instructions  Medication Instructions:  The current medical regimen is effective;  continue present plan and medications.  *If you need a refill on your cardiac medications before your next appointment, please call your pharmacy*  Follow-Up: At [facility], you and your health needs are our priority.  As part of our continuing mission to provide you with exceptional heart care, we have created designated Provider Care Teams.  These Care Teams include your primary Cardiologist (physician) and Advanced Practice Providers (APPs -  Physician Assistants and Nurse  Practitioners) who all work together to provide you with the care you need, when you need it.  We recommend signing up for the patient portal called "MyChart".  Sign up information is provided on this After Visit Summary.  MyChart is used to connect with patients for Virtual Visits (Telemedicine).  Patients are able to view lab/test results, encounter notes, upcoming appointments, etc.  Non-urgent messages can be sent to your provider as well.   To learn more about what you can do with MyChart, go to [email or web address].    Your next appointment:   1 year(s)  The format for your next appointment:   In Person  Provider:   Candee Furbish, MD      Important Information About Sugar         I,Mathew Stumpf,acting as a scribe for Candee Furbish, MD.,have documented all relevant documentation on the behalf of Candee Furbish, MD,as directed by  Candee Furbish, MD while in the presence of Candee Furbish, MD.  I, Candee Furbish, MD, have reviewed all documentation for this visit. The documentation on 11/28/21 for the exam,  diagnosis, procedures, and orders are all accurate and complete.   Signed, Candee Furbish, MD  11/28/2021 9:36 AM    [facility]

## 2021-11-29 ENCOUNTER — Ambulatory Visit: Payer: PPO

## 2021-11-29 ENCOUNTER — Ambulatory Visit: Payer: HMO | Admitting: Podiatry

## 2021-12-19 ENCOUNTER — Encounter: Payer: Self-pay | Admitting: Podiatry

## 2021-12-19 ENCOUNTER — Ambulatory Visit: Payer: HMO | Admitting: Podiatry

## 2021-12-19 ENCOUNTER — Ambulatory Visit (INDEPENDENT_AMBULATORY_CARE_PROVIDER_SITE_OTHER): Payer: HMO | Admitting: Podiatry

## 2021-12-19 DIAGNOSIS — M79674 Pain in right toe(s): Secondary | ICD-10-CM

## 2021-12-19 DIAGNOSIS — M79675 Pain in left toe(s): Secondary | ICD-10-CM

## 2021-12-19 DIAGNOSIS — B351 Tinea unguium: Secondary | ICD-10-CM

## 2021-12-19 DIAGNOSIS — Z794 Long term (current) use of insulin: Secondary | ICD-10-CM

## 2021-12-19 DIAGNOSIS — E1142 Type 2 diabetes mellitus with diabetic polyneuropathy: Secondary | ICD-10-CM

## 2021-12-19 NOTE — Progress Notes (Signed)
  Subjective:  Patient ID: [name], female    DOB: [birth year],  MRN: [id number]  [name] presents to clinic today for painful elongated mycotic toenails 1-5 bilaterally which are tender when wearing enclosed shoe gear. Pain is relieved with periodic professional debridement.   Chief Complaint  Patient presents with   Nail Problem    RFC Bilateral nail trim    New problem(s): None.   PCP is [facility] [place] , and last visit was October, 2023.  Allergies  Allergen Reactions   Amoxil [Amoxicillin] Swelling and Other (See Comments)    Lips Has patient had a PCN reaction causing immediate rash, facial/tongue/throat swelling, SOB or lightheadedness with hypotension:YES Has patient had a PCN reaction causing severe rash involving mucus membranes or skin necrosis:No Has patient had a PCN reaction that required hospitalization:No Has patient had a PCN reaction occurring within the last 10 years:Yes. If all of the above answers are "NO", then may proceed with Cephalosporin use.    Lisinopril Itching and Swelling    SWELLING REACTION UNSPECIFIED    Chocolate Hives   Amoxicillin-Pot Clavulanate Swelling   Demerol Nausea And Vomiting   Other Other (See Comments)    Raw food or nuts - GI pain    Review of Systems: Negative except as noted in the HPI.  Objective: No changes noted in today's physical examination.  [name] is an extremely pleasant 81 y.o. female obese in NAD. AAO x 3.  Vascular Examination: CFT <3 seconds b/l LE. Faintly palpable pedal pulses b/l. Pedal hair absent. No pain with calf compression b/l. Lower extremity skin temperature gradient within normal limits. Dependent edema noted b/l LE. Mild varicosities b/l LE. No cyanosis or clubbing noted b/l LE.  Neurological Examination: Protective sensation diminished with 10g monofilament b/l.  Dermatological Examination: Pedal skin with normal turgor, texture and tone b/l. Toenails 1-5 b/l  thick, discolored, elongated with subungual debris and pain on dorsal palpation. There is evidence of subacute subungual hematoma of the L 5th toe. Nailplate remains adhered. There is no  tenderness to palpation.  Musculoskeletal Examination: Muscle strength 4/5 to all lower extremity muscle groups bilaterally. No pain, crepitus or joint limitation noted with ROM bilateral LE. No gross bony deformities bilaterally. Utilizes wheelchair for mobility assistance.  Radiographs: None  Last A1c:      Latest Ref Rng & Units 01/26/2021    7:56 AM  Hemoglobin A1C  Hemoglobin-A1c 4.6 - 6.5 % 8.3    Assessment/Plan: 1. Pain due to onychomycosis of toenails of both feet   2. Type 2 diabetes mellitus with diabetic polyneuropathy, with long-term current use of insulin (HCC)     No orders of the defined types were placed in this encounter.   -Consent given for treatment as described below: -Examined patient. -Facility to continue fall precautions and pressure precautions. -Order written for facility to monitor left 5th toe for any changes in regards to subungual hematoma. -Continue foot and shoe inspections daily. Monitor blood glucose per PCP/Endocrinologist's recommendations. -Patient to continue soft, supportive shoe gear daily. -Toenails 1-5 b/l were debrided in length and girth with sterile nail nippers and dremel without iatrogenic bleeding.  -Patient/POA to call should there be question/concern in the interim.   Return in about 3 months (around 03/21/2022).  Marzetta Board, DPM

## 2022-02-15 ENCOUNTER — Encounter (HOSPITAL_BASED_OUTPATIENT_CLINIC_OR_DEPARTMENT_OTHER): Payer: Self-pay

## 2022-02-15 ENCOUNTER — Emergency Department (HOSPITAL_BASED_OUTPATIENT_CLINIC_OR_DEPARTMENT_OTHER)
Admission: EM | Admit: 2022-02-15 | Discharge: 2022-02-15 | Disposition: A | Discharge status: Home / Self Care | Payer: HMO | Attending: Emergency Medicine | Admitting: Emergency Medicine

## 2022-02-15 ENCOUNTER — Other Ambulatory Visit: Payer: Self-pay

## 2022-02-15 ENCOUNTER — Emergency Department (HOSPITAL_BASED_OUTPATIENT_CLINIC_OR_DEPARTMENT_OTHER): Payer: HMO

## 2022-02-15 DIAGNOSIS — Z794 Long term (current) use of insulin: Secondary | ICD-10-CM | POA: Insufficient documentation

## 2022-02-15 DIAGNOSIS — S92515A Nondisplaced fracture of proximal phalanx of left lesser toe(s), initial encounter for closed fracture: Secondary | ICD-10-CM | POA: Insufficient documentation

## 2022-02-15 DIAGNOSIS — Z7982 Long term (current) use of aspirin: Secondary | ICD-10-CM | POA: Diagnosis not present

## 2022-02-15 DIAGNOSIS — I10 Essential (primary) hypertension: Secondary | ICD-10-CM | POA: Insufficient documentation

## 2022-02-15 DIAGNOSIS — E1165 Type 2 diabetes mellitus with hyperglycemia: Secondary | ICD-10-CM | POA: Diagnosis not present

## 2022-02-15 DIAGNOSIS — R2243 Localized swelling, mass and lump, lower limb, bilateral: Secondary | ICD-10-CM | POA: Diagnosis not present

## 2022-02-15 DIAGNOSIS — E119 Type 2 diabetes mellitus without complications: Secondary | ICD-10-CM | POA: Insufficient documentation

## 2022-02-15 DIAGNOSIS — R609 Edema, unspecified: Secondary | ICD-10-CM | POA: Diagnosis not present

## 2022-02-15 DIAGNOSIS — M79605 Pain in left leg: Secondary | ICD-10-CM | POA: Diagnosis present

## 2022-02-15 DIAGNOSIS — F0390 Unspecified dementia without behavioral disturbance: Secondary | ICD-10-CM | POA: Diagnosis not present

## 2022-02-15 DIAGNOSIS — Z79899 Other long term (current) drug therapy: Secondary | ICD-10-CM | POA: Diagnosis not present

## 2022-02-15 DIAGNOSIS — S92512A Displaced fracture of proximal phalanx of left lesser toe(s), initial encounter for closed fracture: Secondary | ICD-10-CM | POA: Insufficient documentation

## 2022-02-15 DIAGNOSIS — G894 Chronic pain syndrome: Secondary | ICD-10-CM | POA: Diagnosis not present

## 2022-02-15 DIAGNOSIS — M79662 Pain in left lower leg: Secondary | ICD-10-CM | POA: Diagnosis not present

## 2022-02-15 DIAGNOSIS — M25559 Pain in unspecified hip: Secondary | ICD-10-CM | POA: Diagnosis not present

## 2022-02-15 DIAGNOSIS — M79671 Pain in right foot: Secondary | ICD-10-CM | POA: Diagnosis not present

## 2022-02-15 DIAGNOSIS — M7989 Other specified soft tissue disorders: Secondary | ICD-10-CM | POA: Diagnosis not present

## 2022-02-15 DIAGNOSIS — X58XXXA Exposure to other specified factors, initial encounter: Secondary | ICD-10-CM | POA: Diagnosis not present

## 2022-02-15 DIAGNOSIS — S92912A Unspecified fracture of left toe(s), initial encounter for closed fracture: Secondary | ICD-10-CM

## 2022-02-15 LAB — CBC WITH DIFFERENTIAL/PLATELET
Abs Immature Granulocytes: 0.07 10*3/uL (ref 0.00–0.07)
Basophils Absolute: 0 10*3/uL (ref 0.0–0.1)
Basophils Relative: 0 %
Eosinophils Absolute: 0.2 10*3/uL (ref 0.0–0.5)
Eosinophils Relative: 3 %
HCT: 39.6 % (ref 36.0–46.0)
Hemoglobin: 13 g/dL (ref 12.0–15.0)
Immature Granulocytes: 1 %
Lymphocytes Relative: 31 %
Lymphs Abs: 2.6 10*3/uL (ref 0.7–4.0)
MCH: 30 pg (ref 26.0–34.0)
MCHC: 32.8 g/dL (ref 30.0–36.0)
MCV: 91.2 fL (ref 80.0–100.0)
Monocytes Absolute: 0.8 10*3/uL (ref 0.1–1.0)
Monocytes Relative: 9 %
Neutro Abs: 4.8 10*3/uL (ref 1.7–7.7)
Neutrophils Relative %: 56 %
Platelets: 166 10*3/uL (ref 150–400)
RBC: 4.34 MIL/uL (ref 3.87–5.11)
RDW: 13.4 % (ref 11.5–15.5)
WBC: 8.5 10*3/uL (ref 4.0–10.5)
nRBC: 0 % (ref 0.0–0.2)

## 2022-02-15 LAB — COMPREHENSIVE METABOLIC PANEL
ALT: 20 U/L (ref 0–44)
AST: 14 U/L — ABNORMAL LOW (ref 15–41)
Albumin: 3.7 g/dL (ref 3.5–5.0)
Alkaline Phosphatase: 60 U/L (ref 38–126)
Anion gap: 6 (ref 5–15)
BUN: 38 mg/dL — ABNORMAL HIGH (ref 8–23)
CO2: 26 mmol/L (ref 22–32)
Calcium: 8.9 mg/dL (ref 8.9–10.3)
Chloride: 104 mmol/L (ref 98–111)
Creatinine, Ser: 1.33 mg/dL — ABNORMAL HIGH (ref 0.44–1.00)
GFR, Estimated: 41 mL/min — ABNORMAL LOW (ref >60)
Glucose, Bld: 126 mg/dL — ABNORMAL HIGH (ref 70–99)
Potassium: 4.7 mmol/L (ref 3.5–5.1)
Sodium: 136 mmol/L (ref 135–145)
Total Bilirubin: 0.7 mg/dL (ref 0.3–1.2)
Total Protein: 6 g/dL — ABNORMAL LOW (ref 6.5–8.1)

## 2022-02-15 NOTE — ED Provider Notes (Signed)
[facility] EMERGENCY DEPT Provider Note   CSN: [id number] Arrival date & time: 02/15/22  1540     History  Chief Complaint  Patient presents with   left leg swelling    [name] is a 82 y.o. female with a past med history of cervical myelopathy, obesity, diabetes, hypertension and peripheral neuropathy along with paroxysmal atrial tachycardia on baby aspirin daily presents emergency department for foot discoloration and leg swelling. Patient states that she had a nurse come out to the house who was very concerned about her feet and told her that she needed to go to the emergency department and have a rule out of blood clot in the legs.  Patient states she has about 4 or 5 years of progressively worsening ambulatory dysfunction and balance issues which she attributes to her history of peripheral neuropathy.  Patient states that she is unsure if she injured her feet because she has such significant numbness she would not be able to tell.  She did fall a week or 2 ago but denies hitting her head or losing consciousness and is unsure if she injured her ankles or feet.  She states that she has a normal amount of swelling in both of her legs and has not noticed anything different.  She denies chest pain or shortness of breath.  HPI     Home Medications Prior to Admission medications   Medication Sig Start Date End Date Taking? Authorizing Provider  acetaminophen (TYLENOL) 325 MG tablet Take 325 mg by mouth every 6 (six) hours as needed.    [provider]  aspirin EC 81 MG tablet Take 81 mg by mouth at bedtime.     [provider]  docusate sodium (COLACE) 100 MG capsule Take 100 mg by mouth daily.    [provider]  DULoxetine (CYMBALTA) 60 MG capsule Take 1 capsule by mouth daily. 04/22/21   [provider]  ezetimibe (ZETIA) 10 MG tablet TAKE 1 TABLET BY MOUTH EVERY DAY 11/05/20   [name], [name], MD  insulin aspart (NOVOLOG FLEXPEN)  100 UNIT/ML FlexPen Max daily 75 units per correction scale Patient taking differently: Inject 44 Units into the skin daily. Max daily 75 units per correction scale 12/02/20   Shamleffer, Melanie Crazier, MD  insulin glargine (LANTUS SOLOSTAR) 100 UNIT/ML Solostar Pen Inject 26 Units into the skin daily. Patient taking differently: Inject 10 Units into the skin daily. 12/02/20   Shamleffer, Melanie Crazier, MD  Insulin Pen Needle (BD PEN NEEDLE MICRO U/F) 32G X 6 MM MISC Inject 1 Device into the skin QID. USE 4 TIMES A DAY NOVOLOG(3) AND LANTUS9(1) 12/02/20   Shamleffer, Melanie Crazier, MD  LIDOCAINE PAIN RELIEF 4 % Place 1 patch onto the skin daily. 03/18/21   [provider]  LINZESS 145 MCG CAPS capsule TAKE 1 CAPSULE BY MOUTH DAILY BEFORE BREAKFAST. 03/22/21   [name], [name], MD  meloxicam (MOBIC) 15 MG tablet TAKE 1 TABLET (15 MG TOTAL) BY MOUTH DAILY. 02/23/21   Raulkar, Clide Deutscher, MD  metoprolol succinate (TOPROL-XL) 25 MG 24 hr tablet TAKE 1 TABLET BY MOUTH DAILY. PLEASE MAKE OVERDUE APPT WITH DR. Marlou Porch BEFORE ANYMORE REFILLS. Patient taking differently: Take 25 mg by mouth daily. 12/13/20   Jerline Pain, MD  MYRBETRIQ 50 MG TB24 tablet TAKE 1 TABLET BY MOUTH EVERY DAY Patient taking differently: Take 50 mg by mouth daily. 09/06/20   [name], [name], MD  PFIZER-BIONT COVID-19 VAC-TRIS SUSP injection  08/05/20   [provider]  polyethylene glycol (MIRALAX / GLYCOLAX) 17 g packet Take 17 g by mouth daily as needed for mild constipation.    [provider]  pregabalin (LYRICA) 75 MG capsule TAKE 1 CAPSULE BY MOUTH 3 TIMES DAILY. 01/21/21   Raulkar, Clide Deutscher, MD  RELION INSULIN SYRINGE 1ML/31G 31G X 5/16" 1 ML MISC Use as directed 01/30/17   [name], [name], MD  rosuvastatin (CRESTOR) 20 MG tablet Take 20 mg by mouth daily.    [provider]  sertraline (ZOLOFT) 50 MG tablet Take 50 mg by mouth daily. 10/04/21   [provider]  verapamil (VERELAN  PM) 240 MG 24 hr capsule Take 1 capsule (240 mg total) by mouth daily. Hold if HR is <60 08/09/21   Imogene Burn, PA-C  vitamin B-12 (CYANOCOBALAMIN) 1000 MCG tablet Take 1,000 mcg by mouth daily.    [provider]      Allergies    Amoxil [amoxicillin], Lisinopril, Chocolate, Amoxicillin-pot clavulanate, Demerol, and Other    Review of Systems   Review of Systems  Physical Exam Updated Vital Signs BP 129/68 (BP Location: Right Arm)   Pulse (!) 57   Temp 97.8 F (36.6 C) (Oral)   Resp 18   SpO2 100%  Physical Exam Vitals and nursing note reviewed.  Constitutional:      General: She is not in acute distress.    Appearance: She is well-developed. She is not diaphoretic.  HENT:     Head: Normocephalic and atraumatic.     Right Ear: External ear normal.     Left Ear: External ear normal.     Nose: Nose normal.     Mouth/Throat:     Mouth: Mucous membranes are moist.  Eyes:     General: No scleral icterus.    Conjunctiva/sclera: Conjunctivae normal.  Cardiovascular:     Rate and Rhythm: Normal rate and regular rhythm.     Pulses:          Dorsalis pedis pulses are 2+ on the right side and 2+ on the left side.     Heart sounds: Normal heart sounds. No murmur heard.    No friction rub. No gallop.     Comments: Patient has 2+ DP pulses on my examination.  Pulmonary:     Effort: Pulmonary effort is normal. No respiratory distress.     Breath sounds: Normal breath sounds.  Abdominal:     General: Bowel sounds are normal. There is no distension.     Palpations: Abdomen is soft. There is no mass.     Tenderness: There is no abdominal tenderness. There is no guarding.  Musculoskeletal:     Cervical back: Normal range of motion.     Right lower leg: Edema present.     Left lower leg: Edema present.     Comments: Bilateral nonpitting peripheral edema.  Legs are equal circumferentially.  Skin:    General: Skin is warm and dry.     Comments: Patient has bruising to  the dorsal surface of the third fourth and fifth digits on the left foot.  There is also bruising to the dorsum of the left foot itself.  Few bruises noted to the right foot which are scattered, 1 toward the medial right ankle. Bluish discoloration of the toes with diminished capillary refill  Neurological:     Mental Status: She is alert and oriented to person, place, and time.  Psychiatric:  Behavior: Behavior normal.     ED Results / Procedures / Treatments   Labs (all labs ordered are listed, but only abnormal results are displayed) Labs Reviewed - No data to display  EKG None  Radiology No results found.  Procedures Procedures    Medications Ordered in ED Medications - No data to display  ED Course/ Medical Decision Making/ A&P Clinical Course as of 02/15/22 1733  Wed Feb 15, 2022  1725 DG Foot Complete Right [AH]  1725 DG Foot Complete Left [AH]    Clinical Course User Index [AH] Margarita Mail, PA-C                           Medical Decision Making 82 year old female with a history of neuropathy scented for evaluation of discoloration of the feet. Differential diagnosis includes DVT, arterial embolus, chronic changes secondary to venous stasis, cellulitis, gangrene. After review of all data points patient appears to have broken toes on the left foot.  I doubt any other emergent cause.  I interpreted labs which appear to be baseline for the patient with some chronic renal insufficiency unchanged. I visualized and interpreted bilateral foot x-rays.  Left foot x-ray shows significant findings of multiple toe fractures.  Patient has an established relationship with podiatry and goes every 3 weeks.  This is confirmed by her daughter at bedside who gives this history.  Patient will be placed in a postop shoe and given very close follow-up with her podiatrist.  Discussed outpatient follow-up and return precautions.  She is otherwise neurovascularly intact and  appears appropriate for discharge at this time  Amount and/or Complexity of Data Reviewed Labs: ordered. Radiology: ordered and independent interpretation performed. Decision-making details documented in ED Course.           Final Clinical Impression(s) / ED Diagnoses Final diagnoses:  Closed displaced fracture of phalanx of toe of left foot, unspecified toe, initial encounter    Rx / DC Orders ED Discharge Orders     None         Margarita Mail, PA-C 02/15/22 1735    Charlesetta Shanks, MD 02/22/22 1737

## 2022-02-15 NOTE — ED Notes (Signed)
Patient arrived from [facility] via EMS

## 2022-02-15 NOTE — ED Notes (Signed)
Attempt to call [facility] for no answer

## 2022-02-15 NOTE — ED Triage Notes (Signed)
She c/o left leg pain and swelling x ~ 2 weeks. She also a/co two (4th and 5th) toes that are "almost black". I am unable to palpate left d.p. and p.t. pulses, however, I am able to doppler them.

## 2022-02-15 NOTE — Discharge Instructions (Signed)
Get help right away if you have: Any of the following in your toes or your foot: Numbness that gets worse. Tingling. Coldness. Blue skin. Redness or swelling that gets worse. Pain that suddenly becomes severe.

## 2022-02-17 ENCOUNTER — Telehealth: Payer: Self-pay

## 2022-02-17 NOTE — Telephone Encounter (Signed)
     Patient  visit on 1/3  at [facility]    Have you been able to follow up with your primary care physician? Yes   The patient was or was not able to obtain any needed medicine or equipment. Yes   Are there diet recommendations that you are having difficulty following? Na   Patient expresses understanding of discharge instructions and education provided has no other needs at this time.  Yes     [place], [facility], Care Management  [phone] [address], [place], [place] [address] Phone: [phone] Email: [email or web address]

## 2022-02-19 DIAGNOSIS — E1142 Type 2 diabetes mellitus with diabetic polyneuropathy: Secondary | ICD-10-CM | POA: Diagnosis not present

## 2022-02-19 DIAGNOSIS — M4712 Other spondylosis with myelopathy, cervical region: Secondary | ICD-10-CM | POA: Diagnosis not present

## 2022-02-19 DIAGNOSIS — M6259 Muscle wasting and atrophy, not elsewhere classified, multiple sites: Secondary | ICD-10-CM | POA: Diagnosis not present

## 2022-02-19 DIAGNOSIS — R262 Difficulty in walking, not elsewhere classified: Secondary | ICD-10-CM | POA: Diagnosis not present

## 2022-02-19 DIAGNOSIS — M797 Fibromyalgia: Secondary | ICD-10-CM | POA: Diagnosis not present

## 2022-02-23 ENCOUNTER — Ambulatory Visit: Payer: HMO | Admitting: Podiatry

## 2022-02-23 DIAGNOSIS — M797 Fibromyalgia: Secondary | ICD-10-CM | POA: Diagnosis not present

## 2022-02-23 DIAGNOSIS — S92512A Displaced fracture of proximal phalanx of left lesser toe(s), initial encounter for closed fracture: Secondary | ICD-10-CM

## 2022-02-23 DIAGNOSIS — G894 Chronic pain syndrome: Secondary | ICD-10-CM | POA: Diagnosis not present

## 2022-02-27 NOTE — Progress Notes (Signed)
  Subjective:  Patient ID: [name], female    DOB: [birth year],  MRN: [id number]  Chief Complaint  Patient presents with   Fracture    ER follow up - L broken toes, 4th & 5th - not very painful, wearing regular shoes - diabetic, sees Dr Elisha Ponder for nails    82 y.o. female presents with the above complaint. History confirmed with patient.   Objective:  Physical Exam: warm, good capillary refill, no trophic changes or ulcerative lesions, normal DP and PT pulses, normal sensory exam, and fourth and fifth toe swelling on the left foot.  No tenderness good range of motion of toes and MPJ   Radiographs: Multiple views x-ray of the left foot: Radiographs reviewed from ER visit 02/15/2022 shows minimally displaced proximal frank fracture of fourth and fifth toes Assessment:   1. Closed displaced fracture of proximal phalanx of lesser toe of left foot, initial encounter      Plan:  Patient was evaluated and treated and all questions answered.  Fracture of left fourth and fifth toe -XR Reviewed with patient -Would benefit from trial of non-operative management for this injury. WBAT in regular shoe she has no pain currently discussed buddy taping of the toes together but relatively asymptomatic for her currently  Return if symptoms worsen or fail to improve.

## 2022-03-06 DIAGNOSIS — G894 Chronic pain syndrome: Secondary | ICD-10-CM | POA: Diagnosis not present

## 2022-03-06 DIAGNOSIS — I10 Essential (primary) hypertension: Secondary | ICD-10-CM | POA: Diagnosis not present

## 2022-03-06 DIAGNOSIS — F331 Major depressive disorder, recurrent, moderate: Secondary | ICD-10-CM | POA: Diagnosis not present

## 2022-03-06 DIAGNOSIS — E669 Obesity, unspecified: Secondary | ICD-10-CM | POA: Diagnosis not present

## 2022-03-06 DIAGNOSIS — M797 Fibromyalgia: Secondary | ICD-10-CM | POA: Diagnosis not present

## 2022-03-06 DIAGNOSIS — E1165 Type 2 diabetes mellitus with hyperglycemia: Secondary | ICD-10-CM | POA: Diagnosis not present

## 2022-03-06 DIAGNOSIS — F419 Anxiety disorder, unspecified: Secondary | ICD-10-CM | POA: Diagnosis not present

## 2022-03-06 DIAGNOSIS — R54 Age-related physical debility: Secondary | ICD-10-CM | POA: Diagnosis not present

## 2022-03-07 DIAGNOSIS — M797 Fibromyalgia: Secondary | ICD-10-CM | POA: Diagnosis not present

## 2022-03-15 DIAGNOSIS — Z6841 Body Mass Index (BMI) 40.0 and over, adult: Secondary | ICD-10-CM | POA: Diagnosis not present

## 2022-03-15 DIAGNOSIS — Z794 Long term (current) use of insulin: Secondary | ICD-10-CM | POA: Diagnosis not present

## 2022-03-15 DIAGNOSIS — E1165 Type 2 diabetes mellitus with hyperglycemia: Secondary | ICD-10-CM | POA: Diagnosis not present

## 2022-03-15 DIAGNOSIS — S92912P Unspecified fracture of left toe(s), subsequent encounter for fracture with malunion: Secondary | ICD-10-CM | POA: Diagnosis not present

## 2022-03-15 DIAGNOSIS — I10 Essential (primary) hypertension: Secondary | ICD-10-CM | POA: Diagnosis not present

## 2022-03-15 DIAGNOSIS — M797 Fibromyalgia: Secondary | ICD-10-CM | POA: Diagnosis not present

## 2022-03-15 DIAGNOSIS — R54 Age-related physical debility: Secondary | ICD-10-CM | POA: Diagnosis not present

## 2022-03-22 DIAGNOSIS — E1142 Type 2 diabetes mellitus with diabetic polyneuropathy: Secondary | ICD-10-CM | POA: Diagnosis not present

## 2022-03-22 DIAGNOSIS — M4712 Other spondylosis with myelopathy, cervical region: Secondary | ICD-10-CM | POA: Diagnosis not present

## 2022-03-22 DIAGNOSIS — M6259 Muscle wasting and atrophy, not elsewhere classified, multiple sites: Secondary | ICD-10-CM | POA: Diagnosis not present

## 2022-03-22 DIAGNOSIS — M797 Fibromyalgia: Secondary | ICD-10-CM | POA: Diagnosis not present

## 2022-03-22 DIAGNOSIS — R262 Difficulty in walking, not elsewhere classified: Secondary | ICD-10-CM | POA: Diagnosis not present

## 2022-03-23 DIAGNOSIS — Z79891 Long term (current) use of opiate analgesic: Secondary | ICD-10-CM | POA: Diagnosis not present

## 2022-03-23 DIAGNOSIS — G894 Chronic pain syndrome: Secondary | ICD-10-CM | POA: Diagnosis not present

## 2022-03-29 DIAGNOSIS — J209 Acute bronchitis, unspecified: Secondary | ICD-10-CM | POA: Diagnosis not present

## 2022-03-30 DIAGNOSIS — R54 Age-related physical debility: Secondary | ICD-10-CM | POA: Diagnosis not present

## 2022-03-30 DIAGNOSIS — J208 Acute bronchitis due to other specified organisms: Secondary | ICD-10-CM | POA: Diagnosis not present

## 2022-03-30 DIAGNOSIS — I10 Essential (primary) hypertension: Secondary | ICD-10-CM | POA: Diagnosis not present

## 2022-04-03 ENCOUNTER — Ambulatory Visit: Payer: HMO | Admitting: Podiatry

## 2022-04-07 DIAGNOSIS — M797 Fibromyalgia: Secondary | ICD-10-CM | POA: Diagnosis not present

## 2022-04-11 DIAGNOSIS — R062 Wheezing: Secondary | ICD-10-CM | POA: Diagnosis not present

## 2022-04-11 DIAGNOSIS — R053 Chronic cough: Secondary | ICD-10-CM | POA: Diagnosis not present

## 2022-04-12 DIAGNOSIS — F419 Anxiety disorder, unspecified: Secondary | ICD-10-CM | POA: Diagnosis not present

## 2022-04-12 DIAGNOSIS — G894 Chronic pain syndrome: Secondary | ICD-10-CM | POA: Diagnosis not present

## 2022-04-12 DIAGNOSIS — M797 Fibromyalgia: Secondary | ICD-10-CM | POA: Diagnosis not present

## 2022-04-12 DIAGNOSIS — F331 Major depressive disorder, recurrent, moderate: Secondary | ICD-10-CM | POA: Diagnosis not present

## 2022-04-12 DIAGNOSIS — E1165 Type 2 diabetes mellitus with hyperglycemia: Secondary | ICD-10-CM | POA: Diagnosis not present

## 2022-04-12 DIAGNOSIS — I10 Essential (primary) hypertension: Secondary | ICD-10-CM | POA: Diagnosis not present

## 2022-04-12 DIAGNOSIS — R54 Age-related physical debility: Secondary | ICD-10-CM | POA: Diagnosis not present

## 2022-04-12 DIAGNOSIS — R0602 Shortness of breath: Secondary | ICD-10-CM | POA: Diagnosis not present

## 2022-04-19 DIAGNOSIS — R54 Age-related physical debility: Secondary | ICD-10-CM | POA: Diagnosis not present

## 2022-04-19 DIAGNOSIS — R053 Chronic cough: Secondary | ICD-10-CM | POA: Diagnosis not present

## 2022-04-19 DIAGNOSIS — F331 Major depressive disorder, recurrent, moderate: Secondary | ICD-10-CM | POA: Diagnosis not present

## 2022-04-19 DIAGNOSIS — R6889 Other general symptoms and signs: Secondary | ICD-10-CM | POA: Diagnosis not present

## 2022-04-19 DIAGNOSIS — I10 Essential (primary) hypertension: Secondary | ICD-10-CM | POA: Diagnosis not present

## 2022-04-19 DIAGNOSIS — G894 Chronic pain syndrome: Secondary | ICD-10-CM | POA: Diagnosis not present

## 2022-04-20 DIAGNOSIS — M4712 Other spondylosis with myelopathy, cervical region: Secondary | ICD-10-CM | POA: Diagnosis not present

## 2022-04-20 DIAGNOSIS — M797 Fibromyalgia: Secondary | ICD-10-CM | POA: Diagnosis not present

## 2022-04-20 DIAGNOSIS — E1142 Type 2 diabetes mellitus with diabetic polyneuropathy: Secondary | ICD-10-CM | POA: Diagnosis not present

## 2022-04-20 DIAGNOSIS — R262 Difficulty in walking, not elsewhere classified: Secondary | ICD-10-CM | POA: Diagnosis not present

## 2022-04-20 DIAGNOSIS — M6259 Muscle wasting and atrophy, not elsewhere classified, multiple sites: Secondary | ICD-10-CM | POA: Diagnosis not present

## 2022-04-28 DIAGNOSIS — N3946 Mixed incontinence: Secondary | ICD-10-CM | POA: Diagnosis not present

## 2022-04-28 DIAGNOSIS — Z7189 Other specified counseling: Secondary | ICD-10-CM | POA: Diagnosis not present

## 2022-04-28 DIAGNOSIS — R54 Age-related physical debility: Secondary | ICD-10-CM | POA: Diagnosis not present

## 2022-04-28 DIAGNOSIS — K5900 Constipation, unspecified: Secondary | ICD-10-CM | POA: Diagnosis not present

## 2022-04-28 DIAGNOSIS — M6281 Muscle weakness (generalized): Secondary | ICD-10-CM | POA: Diagnosis not present

## 2022-04-28 DIAGNOSIS — K219 Gastro-esophageal reflux disease without esophagitis: Secondary | ICD-10-CM | POA: Diagnosis not present

## 2022-04-28 DIAGNOSIS — Z0189 Encounter for other specified special examinations: Secondary | ICD-10-CM | POA: Diagnosis not present

## 2022-04-28 DIAGNOSIS — Z7401 Bed confinement status: Secondary | ICD-10-CM | POA: Diagnosis not present

## 2022-05-03 DIAGNOSIS — G894 Chronic pain syndrome: Secondary | ICD-10-CM | POA: Diagnosis not present

## 2022-05-03 DIAGNOSIS — N3946 Mixed incontinence: Secondary | ICD-10-CM | POA: Diagnosis not present

## 2022-05-03 DIAGNOSIS — M797 Fibromyalgia: Secondary | ICD-10-CM | POA: Diagnosis not present

## 2022-05-03 DIAGNOSIS — R159 Full incontinence of feces: Secondary | ICD-10-CM | POA: Diagnosis not present

## 2022-05-05 DIAGNOSIS — J329 Chronic sinusitis, unspecified: Secondary | ICD-10-CM | POA: Diagnosis not present

## 2022-05-06 DIAGNOSIS — M797 Fibromyalgia: Secondary | ICD-10-CM | POA: Diagnosis not present

## 2022-05-10 DIAGNOSIS — R54 Age-related physical debility: Secondary | ICD-10-CM | POA: Diagnosis not present

## 2022-05-10 DIAGNOSIS — M6281 Muscle weakness (generalized): Secondary | ICD-10-CM | POA: Diagnosis not present

## 2022-05-10 DIAGNOSIS — Z7401 Bed confinement status: Secondary | ICD-10-CM | POA: Diagnosis not present

## 2022-05-10 DIAGNOSIS — R159 Full incontinence of feces: Secondary | ICD-10-CM | POA: Diagnosis not present

## 2022-05-10 DIAGNOSIS — K219 Gastro-esophageal reflux disease without esophagitis: Secondary | ICD-10-CM | POA: Diagnosis not present

## 2022-05-10 DIAGNOSIS — N3946 Mixed incontinence: Secondary | ICD-10-CM | POA: Diagnosis not present

## 2022-05-10 DIAGNOSIS — Z8709 Personal history of other diseases of the respiratory system: Secondary | ICD-10-CM | POA: Diagnosis not present

## 2022-05-11 DIAGNOSIS — M797 Fibromyalgia: Secondary | ICD-10-CM | POA: Diagnosis not present

## 2022-05-11 DIAGNOSIS — E1165 Type 2 diabetes mellitus with hyperglycemia: Secondary | ICD-10-CM | POA: Diagnosis not present

## 2022-05-11 DIAGNOSIS — G894 Chronic pain syndrome: Secondary | ICD-10-CM | POA: Diagnosis not present

## 2022-05-11 DIAGNOSIS — R54 Age-related physical debility: Secondary | ICD-10-CM | POA: Diagnosis not present

## 2022-05-11 DIAGNOSIS — F331 Major depressive disorder, recurrent, moderate: Secondary | ICD-10-CM | POA: Diagnosis not present

## 2022-05-11 DIAGNOSIS — F419 Anxiety disorder, unspecified: Secondary | ICD-10-CM | POA: Diagnosis not present

## 2022-05-11 DIAGNOSIS — E669 Obesity, unspecified: Secondary | ICD-10-CM | POA: Diagnosis not present

## 2022-05-11 DIAGNOSIS — I10 Essential (primary) hypertension: Secondary | ICD-10-CM | POA: Diagnosis not present

## 2022-06-03 DIAGNOSIS — G894 Chronic pain syndrome: Secondary | ICD-10-CM | POA: Diagnosis not present

## 2022-06-03 DIAGNOSIS — M797 Fibromyalgia: Secondary | ICD-10-CM | POA: Diagnosis not present

## 2022-06-03 DIAGNOSIS — N3946 Mixed incontinence: Secondary | ICD-10-CM | POA: Diagnosis not present

## 2022-06-03 DIAGNOSIS — R159 Full incontinence of feces: Secondary | ICD-10-CM | POA: Diagnosis not present

## 2022-06-06 DIAGNOSIS — R54 Age-related physical debility: Secondary | ICD-10-CM | POA: Diagnosis not present

## 2022-06-06 DIAGNOSIS — M6281 Muscle weakness (generalized): Secondary | ICD-10-CM | POA: Diagnosis not present

## 2022-06-06 DIAGNOSIS — M797 Fibromyalgia: Secondary | ICD-10-CM | POA: Diagnosis not present

## 2022-06-08 DIAGNOSIS — K219 Gastro-esophageal reflux disease without esophagitis: Secondary | ICD-10-CM | POA: Diagnosis not present

## 2022-06-08 DIAGNOSIS — K5900 Constipation, unspecified: Secondary | ICD-10-CM | POA: Diagnosis not present

## 2022-06-08 DIAGNOSIS — F419 Anxiety disorder, unspecified: Secondary | ICD-10-CM | POA: Diagnosis not present

## 2022-06-08 DIAGNOSIS — Z9181 History of falling: Secondary | ICD-10-CM | POA: Diagnosis not present

## 2022-06-08 DIAGNOSIS — J329 Chronic sinusitis, unspecified: Secondary | ICD-10-CM | POA: Diagnosis not present

## 2022-06-08 DIAGNOSIS — Z791 Long term (current) use of non-steroidal anti-inflammatories (NSAID): Secondary | ICD-10-CM | POA: Diagnosis not present

## 2022-06-08 DIAGNOSIS — E119 Type 2 diabetes mellitus without complications: Secondary | ICD-10-CM | POA: Diagnosis not present

## 2022-06-08 DIAGNOSIS — F331 Major depressive disorder, recurrent, moderate: Secondary | ICD-10-CM | POA: Diagnosis not present

## 2022-06-08 DIAGNOSIS — G894 Chronic pain syndrome: Secondary | ICD-10-CM | POA: Diagnosis not present

## 2022-06-08 DIAGNOSIS — I10 Essential (primary) hypertension: Secondary | ICD-10-CM | POA: Diagnosis not present

## 2022-06-08 DIAGNOSIS — I4719 Other supraventricular tachycardia: Secondary | ICD-10-CM | POA: Diagnosis not present

## 2022-06-08 DIAGNOSIS — N3946 Mixed incontinence: Secondary | ICD-10-CM | POA: Diagnosis not present

## 2022-06-08 DIAGNOSIS — R300 Dysuria: Secondary | ICD-10-CM | POA: Diagnosis not present

## 2022-06-08 DIAGNOSIS — Z7982 Long term (current) use of aspirin: Secondary | ICD-10-CM | POA: Diagnosis not present

## 2022-06-08 DIAGNOSIS — Z6841 Body Mass Index (BMI) 40.0 and over, adult: Secondary | ICD-10-CM | POA: Diagnosis not present

## 2022-06-08 DIAGNOSIS — Z794 Long term (current) use of insulin: Secondary | ICD-10-CM | POA: Diagnosis not present

## 2022-06-08 DIAGNOSIS — M797 Fibromyalgia: Secondary | ICD-10-CM | POA: Diagnosis not present

## 2022-06-08 DIAGNOSIS — Z7401 Bed confinement status: Secondary | ICD-10-CM | POA: Diagnosis not present

## 2022-06-13 DIAGNOSIS — I4719 Other supraventricular tachycardia: Secondary | ICD-10-CM | POA: Diagnosis not present

## 2022-06-13 DIAGNOSIS — F419 Anxiety disorder, unspecified: Secondary | ICD-10-CM | POA: Diagnosis not present

## 2022-06-13 DIAGNOSIS — K5900 Constipation, unspecified: Secondary | ICD-10-CM | POA: Diagnosis not present

## 2022-06-13 DIAGNOSIS — R54 Age-related physical debility: Secondary | ICD-10-CM | POA: Diagnosis not present

## 2022-06-13 DIAGNOSIS — J329 Chronic sinusitis, unspecified: Secondary | ICD-10-CM | POA: Diagnosis not present

## 2022-06-13 DIAGNOSIS — E119 Type 2 diabetes mellitus without complications: Secondary | ICD-10-CM | POA: Diagnosis not present

## 2022-06-13 DIAGNOSIS — F331 Major depressive disorder, recurrent, moderate: Secondary | ICD-10-CM | POA: Diagnosis not present

## 2022-06-13 DIAGNOSIS — Z9181 History of falling: Secondary | ICD-10-CM | POA: Diagnosis not present

## 2022-06-13 DIAGNOSIS — Z794 Long term (current) use of insulin: Secondary | ICD-10-CM | POA: Diagnosis not present

## 2022-06-13 DIAGNOSIS — I10 Essential (primary) hypertension: Secondary | ICD-10-CM | POA: Diagnosis not present

## 2022-06-13 DIAGNOSIS — Z791 Long term (current) use of non-steroidal anti-inflammatories (NSAID): Secondary | ICD-10-CM | POA: Diagnosis not present

## 2022-06-13 DIAGNOSIS — G894 Chronic pain syndrome: Secondary | ICD-10-CM | POA: Diagnosis not present

## 2022-06-13 DIAGNOSIS — M797 Fibromyalgia: Secondary | ICD-10-CM | POA: Diagnosis not present

## 2022-06-13 DIAGNOSIS — E1165 Type 2 diabetes mellitus with hyperglycemia: Secondary | ICD-10-CM | POA: Diagnosis not present

## 2022-06-13 DIAGNOSIS — N3946 Mixed incontinence: Secondary | ICD-10-CM | POA: Diagnosis not present

## 2022-06-13 DIAGNOSIS — Z6841 Body Mass Index (BMI) 40.0 and over, adult: Secondary | ICD-10-CM | POA: Diagnosis not present

## 2022-06-13 DIAGNOSIS — Z7401 Bed confinement status: Secondary | ICD-10-CM | POA: Diagnosis not present

## 2022-06-13 DIAGNOSIS — R300 Dysuria: Secondary | ICD-10-CM | POA: Diagnosis not present

## 2022-06-13 DIAGNOSIS — K219 Gastro-esophageal reflux disease without esophagitis: Secondary | ICD-10-CM | POA: Diagnosis not present

## 2022-06-13 DIAGNOSIS — Z7982 Long term (current) use of aspirin: Secondary | ICD-10-CM | POA: Diagnosis not present

## 2022-06-14 DIAGNOSIS — Z7982 Long term (current) use of aspirin: Secondary | ICD-10-CM | POA: Diagnosis not present

## 2022-06-14 DIAGNOSIS — Z791 Long term (current) use of non-steroidal anti-inflammatories (NSAID): Secondary | ICD-10-CM | POA: Diagnosis not present

## 2022-06-14 DIAGNOSIS — M797 Fibromyalgia: Secondary | ICD-10-CM | POA: Diagnosis not present

## 2022-06-14 DIAGNOSIS — Z6841 Body Mass Index (BMI) 40.0 and over, adult: Secondary | ICD-10-CM | POA: Diagnosis not present

## 2022-06-14 DIAGNOSIS — I10 Essential (primary) hypertension: Secondary | ICD-10-CM | POA: Diagnosis not present

## 2022-06-14 DIAGNOSIS — K5900 Constipation, unspecified: Secondary | ICD-10-CM | POA: Diagnosis not present

## 2022-06-14 DIAGNOSIS — K219 Gastro-esophageal reflux disease without esophagitis: Secondary | ICD-10-CM | POA: Diagnosis not present

## 2022-06-14 DIAGNOSIS — R300 Dysuria: Secondary | ICD-10-CM | POA: Diagnosis not present

## 2022-06-14 DIAGNOSIS — Z7401 Bed confinement status: Secondary | ICD-10-CM | POA: Diagnosis not present

## 2022-06-14 DIAGNOSIS — Z794 Long term (current) use of insulin: Secondary | ICD-10-CM | POA: Diagnosis not present

## 2022-06-14 DIAGNOSIS — G894 Chronic pain syndrome: Secondary | ICD-10-CM | POA: Diagnosis not present

## 2022-06-14 DIAGNOSIS — F331 Major depressive disorder, recurrent, moderate: Secondary | ICD-10-CM | POA: Diagnosis not present

## 2022-06-14 DIAGNOSIS — J329 Chronic sinusitis, unspecified: Secondary | ICD-10-CM | POA: Diagnosis not present

## 2022-06-14 DIAGNOSIS — N3946 Mixed incontinence: Secondary | ICD-10-CM | POA: Diagnosis not present

## 2022-06-14 DIAGNOSIS — I4719 Other supraventricular tachycardia: Secondary | ICD-10-CM | POA: Diagnosis not present

## 2022-06-14 DIAGNOSIS — E119 Type 2 diabetes mellitus without complications: Secondary | ICD-10-CM | POA: Diagnosis not present

## 2022-06-14 DIAGNOSIS — Z9181 History of falling: Secondary | ICD-10-CM | POA: Diagnosis not present

## 2022-06-14 DIAGNOSIS — F419 Anxiety disorder, unspecified: Secondary | ICD-10-CM | POA: Diagnosis not present

## 2022-06-16 DIAGNOSIS — I10 Essential (primary) hypertension: Secondary | ICD-10-CM | POA: Diagnosis not present

## 2022-06-16 DIAGNOSIS — F419 Anxiety disorder, unspecified: Secondary | ICD-10-CM | POA: Diagnosis not present

## 2022-06-16 DIAGNOSIS — K5900 Constipation, unspecified: Secondary | ICD-10-CM | POA: Diagnosis not present

## 2022-06-16 DIAGNOSIS — R300 Dysuria: Secondary | ICD-10-CM | POA: Diagnosis not present

## 2022-06-16 DIAGNOSIS — I4719 Other supraventricular tachycardia: Secondary | ICD-10-CM | POA: Diagnosis not present

## 2022-06-16 DIAGNOSIS — F331 Major depressive disorder, recurrent, moderate: Secondary | ICD-10-CM | POA: Diagnosis not present

## 2022-06-16 DIAGNOSIS — E119 Type 2 diabetes mellitus without complications: Secondary | ICD-10-CM | POA: Diagnosis not present

## 2022-06-16 DIAGNOSIS — N3946 Mixed incontinence: Secondary | ICD-10-CM | POA: Diagnosis not present

## 2022-06-16 DIAGNOSIS — K219 Gastro-esophageal reflux disease without esophagitis: Secondary | ICD-10-CM | POA: Diagnosis not present

## 2022-06-16 DIAGNOSIS — Z6841 Body Mass Index (BMI) 40.0 and over, adult: Secondary | ICD-10-CM | POA: Diagnosis not present

## 2022-06-16 DIAGNOSIS — G894 Chronic pain syndrome: Secondary | ICD-10-CM | POA: Diagnosis not present

## 2022-06-16 DIAGNOSIS — M797 Fibromyalgia: Secondary | ICD-10-CM | POA: Diagnosis not present

## 2022-06-16 DIAGNOSIS — J329 Chronic sinusitis, unspecified: Secondary | ICD-10-CM | POA: Diagnosis not present

## 2022-06-16 DIAGNOSIS — Z7982 Long term (current) use of aspirin: Secondary | ICD-10-CM | POA: Diagnosis not present

## 2022-06-16 DIAGNOSIS — Z7401 Bed confinement status: Secondary | ICD-10-CM | POA: Diagnosis not present

## 2022-06-16 DIAGNOSIS — Z791 Long term (current) use of non-steroidal anti-inflammatories (NSAID): Secondary | ICD-10-CM | POA: Diagnosis not present

## 2022-06-16 DIAGNOSIS — Z9181 History of falling: Secondary | ICD-10-CM | POA: Diagnosis not present

## 2022-06-16 DIAGNOSIS — Z794 Long term (current) use of insulin: Secondary | ICD-10-CM | POA: Diagnosis not present

## 2022-06-22 DIAGNOSIS — F331 Major depressive disorder, recurrent, moderate: Secondary | ICD-10-CM | POA: Diagnosis not present

## 2022-06-22 DIAGNOSIS — N3946 Mixed incontinence: Secondary | ICD-10-CM | POA: Diagnosis not present

## 2022-06-22 DIAGNOSIS — Z7401 Bed confinement status: Secondary | ICD-10-CM | POA: Diagnosis not present

## 2022-06-22 DIAGNOSIS — R300 Dysuria: Secondary | ICD-10-CM | POA: Diagnosis not present

## 2022-06-22 DIAGNOSIS — J329 Chronic sinusitis, unspecified: Secondary | ICD-10-CM | POA: Diagnosis not present

## 2022-06-22 DIAGNOSIS — M797 Fibromyalgia: Secondary | ICD-10-CM | POA: Diagnosis not present

## 2022-06-22 DIAGNOSIS — E119 Type 2 diabetes mellitus without complications: Secondary | ICD-10-CM | POA: Diagnosis not present

## 2022-06-22 DIAGNOSIS — Z791 Long term (current) use of non-steroidal anti-inflammatories (NSAID): Secondary | ICD-10-CM | POA: Diagnosis not present

## 2022-06-22 DIAGNOSIS — G894 Chronic pain syndrome: Secondary | ICD-10-CM | POA: Diagnosis not present

## 2022-06-22 DIAGNOSIS — I4719 Other supraventricular tachycardia: Secondary | ICD-10-CM | POA: Diagnosis not present

## 2022-06-22 DIAGNOSIS — K5900 Constipation, unspecified: Secondary | ICD-10-CM | POA: Diagnosis not present

## 2022-06-22 DIAGNOSIS — Z7982 Long term (current) use of aspirin: Secondary | ICD-10-CM | POA: Diagnosis not present

## 2022-06-22 DIAGNOSIS — F419 Anxiety disorder, unspecified: Secondary | ICD-10-CM | POA: Diagnosis not present

## 2022-06-22 DIAGNOSIS — Z794 Long term (current) use of insulin: Secondary | ICD-10-CM | POA: Diagnosis not present

## 2022-06-22 DIAGNOSIS — I10 Essential (primary) hypertension: Secondary | ICD-10-CM | POA: Diagnosis not present

## 2022-06-22 DIAGNOSIS — Z6841 Body Mass Index (BMI) 40.0 and over, adult: Secondary | ICD-10-CM | POA: Diagnosis not present

## 2022-06-22 DIAGNOSIS — Z9181 History of falling: Secondary | ICD-10-CM | POA: Diagnosis not present

## 2022-06-22 DIAGNOSIS — K219 Gastro-esophageal reflux disease without esophagitis: Secondary | ICD-10-CM | POA: Diagnosis not present

## 2022-06-28 DIAGNOSIS — R300 Dysuria: Secondary | ICD-10-CM | POA: Diagnosis not present

## 2022-06-28 DIAGNOSIS — I10 Essential (primary) hypertension: Secondary | ICD-10-CM | POA: Diagnosis not present

## 2022-06-28 DIAGNOSIS — F419 Anxiety disorder, unspecified: Secondary | ICD-10-CM | POA: Diagnosis not present

## 2022-06-28 DIAGNOSIS — G894 Chronic pain syndrome: Secondary | ICD-10-CM | POA: Diagnosis not present

## 2022-06-28 DIAGNOSIS — Z6841 Body Mass Index (BMI) 40.0 and over, adult: Secondary | ICD-10-CM | POA: Diagnosis not present

## 2022-06-28 DIAGNOSIS — M797 Fibromyalgia: Secondary | ICD-10-CM | POA: Diagnosis not present

## 2022-06-28 DIAGNOSIS — J329 Chronic sinusitis, unspecified: Secondary | ICD-10-CM | POA: Diagnosis not present

## 2022-06-28 DIAGNOSIS — K219 Gastro-esophageal reflux disease without esophagitis: Secondary | ICD-10-CM | POA: Diagnosis not present

## 2022-06-28 DIAGNOSIS — Z7401 Bed confinement status: Secondary | ICD-10-CM | POA: Diagnosis not present

## 2022-06-28 DIAGNOSIS — Z791 Long term (current) use of non-steroidal anti-inflammatories (NSAID): Secondary | ICD-10-CM | POA: Diagnosis not present

## 2022-06-28 DIAGNOSIS — F331 Major depressive disorder, recurrent, moderate: Secondary | ICD-10-CM | POA: Diagnosis not present

## 2022-06-28 DIAGNOSIS — Z7982 Long term (current) use of aspirin: Secondary | ICD-10-CM | POA: Diagnosis not present

## 2022-06-28 DIAGNOSIS — K5900 Constipation, unspecified: Secondary | ICD-10-CM | POA: Diagnosis not present

## 2022-06-28 DIAGNOSIS — E119 Type 2 diabetes mellitus without complications: Secondary | ICD-10-CM | POA: Diagnosis not present

## 2022-06-28 DIAGNOSIS — I4719 Other supraventricular tachycardia: Secondary | ICD-10-CM | POA: Diagnosis not present

## 2022-06-28 DIAGNOSIS — Z794 Long term (current) use of insulin: Secondary | ICD-10-CM | POA: Diagnosis not present

## 2022-06-28 DIAGNOSIS — N3946 Mixed incontinence: Secondary | ICD-10-CM | POA: Diagnosis not present

## 2022-06-28 DIAGNOSIS — Z9181 History of falling: Secondary | ICD-10-CM | POA: Diagnosis not present

## 2022-07-03 DIAGNOSIS — R159 Full incontinence of feces: Secondary | ICD-10-CM | POA: Diagnosis not present

## 2022-07-03 DIAGNOSIS — M797 Fibromyalgia: Secondary | ICD-10-CM | POA: Diagnosis not present

## 2022-07-03 DIAGNOSIS — G894 Chronic pain syndrome: Secondary | ICD-10-CM | POA: Diagnosis not present

## 2022-07-03 DIAGNOSIS — N3946 Mixed incontinence: Secondary | ICD-10-CM | POA: Diagnosis not present

## 2022-07-06 DIAGNOSIS — M797 Fibromyalgia: Secondary | ICD-10-CM | POA: Diagnosis not present

## 2022-07-07 DIAGNOSIS — G894 Chronic pain syndrome: Secondary | ICD-10-CM | POA: Diagnosis not present

## 2022-07-07 DIAGNOSIS — R519 Headache, unspecified: Secondary | ICD-10-CM | POA: Diagnosis not present

## 2022-07-07 DIAGNOSIS — E1165 Type 2 diabetes mellitus with hyperglycemia: Secondary | ICD-10-CM | POA: Diagnosis not present

## 2022-07-07 DIAGNOSIS — R54 Age-related physical debility: Secondary | ICD-10-CM | POA: Diagnosis not present

## 2022-07-07 DIAGNOSIS — F419 Anxiety disorder, unspecified: Secondary | ICD-10-CM | POA: Diagnosis not present

## 2022-07-07 DIAGNOSIS — I10 Essential (primary) hypertension: Secondary | ICD-10-CM | POA: Diagnosis not present

## 2022-07-13 DIAGNOSIS — I10 Essential (primary) hypertension: Secondary | ICD-10-CM | POA: Diagnosis not present

## 2022-07-13 DIAGNOSIS — M797 Fibromyalgia: Secondary | ICD-10-CM | POA: Diagnosis not present

## 2022-07-13 DIAGNOSIS — E1165 Type 2 diabetes mellitus with hyperglycemia: Secondary | ICD-10-CM | POA: Diagnosis not present

## 2022-07-13 DIAGNOSIS — F419 Anxiety disorder, unspecified: Secondary | ICD-10-CM | POA: Diagnosis not present

## 2022-07-13 DIAGNOSIS — G894 Chronic pain syndrome: Secondary | ICD-10-CM | POA: Diagnosis not present

## 2022-07-13 DIAGNOSIS — R54 Age-related physical debility: Secondary | ICD-10-CM | POA: Diagnosis not present

## 2022-07-13 DIAGNOSIS — F331 Major depressive disorder, recurrent, moderate: Secondary | ICD-10-CM | POA: Diagnosis not present

## 2022-07-21 DIAGNOSIS — M24542 Contracture, left hand: Secondary | ICD-10-CM | POA: Diagnosis not present

## 2022-07-21 DIAGNOSIS — M24541 Contracture, right hand: Secondary | ICD-10-CM | POA: Diagnosis not present

## 2022-07-28 DIAGNOSIS — M6281 Muscle weakness (generalized): Secondary | ICD-10-CM | POA: Diagnosis not present

## 2022-07-28 DIAGNOSIS — R531 Weakness: Secondary | ICD-10-CM | POA: Diagnosis not present

## 2022-07-28 DIAGNOSIS — M25511 Pain in right shoulder: Secondary | ICD-10-CM | POA: Diagnosis not present

## 2022-07-28 DIAGNOSIS — M25519 Pain in unspecified shoulder: Secondary | ICD-10-CM | POA: Diagnosis not present

## 2022-07-28 DIAGNOSIS — I10 Essential (primary) hypertension: Secondary | ICD-10-CM | POA: Diagnosis not present

## 2022-07-28 DIAGNOSIS — Z7401 Bed confinement status: Secondary | ICD-10-CM | POA: Diagnosis not present

## 2022-08-02 DIAGNOSIS — Z794 Long term (current) use of insulin: Secondary | ICD-10-CM | POA: Diagnosis not present

## 2022-08-02 DIAGNOSIS — F419 Anxiety disorder, unspecified: Secondary | ICD-10-CM | POA: Diagnosis not present

## 2022-08-02 DIAGNOSIS — E1165 Type 2 diabetes mellitus with hyperglycemia: Secondary | ICD-10-CM | POA: Diagnosis not present

## 2022-08-02 DIAGNOSIS — I10 Essential (primary) hypertension: Secondary | ICD-10-CM | POA: Diagnosis not present

## 2022-08-02 DIAGNOSIS — R54 Age-related physical debility: Secondary | ICD-10-CM | POA: Diagnosis not present

## 2022-08-03 DIAGNOSIS — N3946 Mixed incontinence: Secondary | ICD-10-CM | POA: Diagnosis not present

## 2022-08-03 DIAGNOSIS — R159 Full incontinence of feces: Secondary | ICD-10-CM | POA: Diagnosis not present

## 2022-08-03 DIAGNOSIS — G894 Chronic pain syndrome: Secondary | ICD-10-CM | POA: Diagnosis not present

## 2022-08-03 DIAGNOSIS — M797 Fibromyalgia: Secondary | ICD-10-CM | POA: Diagnosis not present

## 2022-08-08 DIAGNOSIS — I10 Essential (primary) hypertension: Secondary | ICD-10-CM | POA: Diagnosis not present

## 2022-08-08 DIAGNOSIS — F331 Major depressive disorder, recurrent, moderate: Secondary | ICD-10-CM | POA: Diagnosis not present

## 2022-08-08 DIAGNOSIS — F419 Anxiety disorder, unspecified: Secondary | ICD-10-CM | POA: Diagnosis not present

## 2022-08-08 DIAGNOSIS — E1165 Type 2 diabetes mellitus with hyperglycemia: Secondary | ICD-10-CM | POA: Diagnosis not present

## 2022-08-08 DIAGNOSIS — G894 Chronic pain syndrome: Secondary | ICD-10-CM | POA: Diagnosis not present

## 2022-08-08 DIAGNOSIS — M797 Fibromyalgia: Secondary | ICD-10-CM | POA: Diagnosis not present

## 2022-08-08 DIAGNOSIS — R54 Age-related physical debility: Secondary | ICD-10-CM | POA: Diagnosis not present

## 2022-08-30 ENCOUNTER — Emergency Department (HOSPITAL_COMMUNITY): Payer: HMO

## 2022-08-30 ENCOUNTER — Inpatient Hospital Stay (HOSPITAL_COMMUNITY)
Admission: EM | Admit: 2022-08-30 | Discharge: 2022-09-02 | DRG: 177 | Disposition: A | Discharge status: Home / Self Care | Payer: HMO | Source: Skilled Nursing Facility | Attending: Internal Medicine | Admitting: Internal Medicine

## 2022-08-30 DIAGNOSIS — Z87891 Personal history of nicotine dependence: Secondary | ICD-10-CM

## 2022-08-30 DIAGNOSIS — Z79899 Other long term (current) drug therapy: Secondary | ICD-10-CM

## 2022-08-30 DIAGNOSIS — Z515 Encounter for palliative care: Secondary | ICD-10-CM

## 2022-08-30 DIAGNOSIS — J9801 Acute bronchospasm: Secondary | ICD-10-CM | POA: Diagnosis present

## 2022-08-30 DIAGNOSIS — R109 Unspecified abdominal pain: Secondary | ICD-10-CM | POA: Diagnosis not present

## 2022-08-30 DIAGNOSIS — K429 Umbilical hernia without obstruction or gangrene: Secondary | ICD-10-CM | POA: Diagnosis not present

## 2022-08-30 DIAGNOSIS — Z7982 Long term (current) use of aspirin: Secondary | ICD-10-CM

## 2022-08-30 DIAGNOSIS — R918 Other nonspecific abnormal finding of lung field: Secondary | ICD-10-CM | POA: Diagnosis not present

## 2022-08-30 DIAGNOSIS — I2510 Atherosclerotic heart disease of native coronary artery without angina pectoris: Secondary | ICD-10-CM | POA: Diagnosis present

## 2022-08-30 DIAGNOSIS — E119 Type 2 diabetes mellitus without complications: Secondary | ICD-10-CM | POA: Diagnosis present

## 2022-08-30 DIAGNOSIS — I672 Cerebral atherosclerosis: Secondary | ICD-10-CM | POA: Diagnosis not present

## 2022-08-30 DIAGNOSIS — R5383 Other fatigue: Secondary | ICD-10-CM | POA: Diagnosis not present

## 2022-08-30 DIAGNOSIS — M4712 Other spondylosis with myelopathy, cervical region: Secondary | ICD-10-CM | POA: Diagnosis not present

## 2022-08-30 DIAGNOSIS — Z96652 Presence of left artificial knee joint: Secondary | ICD-10-CM | POA: Diagnosis present

## 2022-08-30 DIAGNOSIS — F410 Panic disorder [episodic paroxysmal anxiety] without agoraphobia: Secondary | ICD-10-CM | POA: Diagnosis present

## 2022-08-30 DIAGNOSIS — G629 Polyneuropathy, unspecified: Secondary | ICD-10-CM | POA: Diagnosis present

## 2022-08-30 DIAGNOSIS — R053 Chronic cough: Secondary | ICD-10-CM | POA: Diagnosis present

## 2022-08-30 DIAGNOSIS — E669 Obesity, unspecified: Secondary | ICD-10-CM | POA: Diagnosis present

## 2022-08-30 DIAGNOSIS — K219 Gastro-esophageal reflux disease without esophagitis: Secondary | ICD-10-CM | POA: Diagnosis present

## 2022-08-30 DIAGNOSIS — R413 Other amnesia: Secondary | ICD-10-CM | POA: Diagnosis present

## 2022-08-30 DIAGNOSIS — E785 Hyperlipidemia, unspecified: Secondary | ICD-10-CM | POA: Diagnosis present

## 2022-08-30 DIAGNOSIS — Z7401 Bed confinement status: Secondary | ICD-10-CM

## 2022-08-30 DIAGNOSIS — Z90710 Acquired absence of both cervix and uterus: Secondary | ICD-10-CM

## 2022-08-30 DIAGNOSIS — G2581 Restless legs syndrome: Secondary | ICD-10-CM | POA: Diagnosis present

## 2022-08-30 DIAGNOSIS — Z66 Do not resuscitate: Secondary | ICD-10-CM | POA: Diagnosis present

## 2022-08-30 DIAGNOSIS — F32A Depression, unspecified: Secondary | ICD-10-CM | POA: Diagnosis present

## 2022-08-30 DIAGNOSIS — G9341 Metabolic encephalopathy: Secondary | ICD-10-CM | POA: Diagnosis present

## 2022-08-30 DIAGNOSIS — R4182 Altered mental status, unspecified: Secondary | ICD-10-CM | POA: Diagnosis not present

## 2022-08-30 DIAGNOSIS — M810 Age-related osteoporosis without current pathological fracture: Secondary | ICD-10-CM | POA: Diagnosis present

## 2022-08-30 DIAGNOSIS — Z83438 Family history of other disorder of lipoprotein metabolism and other lipidemia: Secondary | ICD-10-CM

## 2022-08-30 DIAGNOSIS — R300 Dysuria: Secondary | ICD-10-CM | POA: Diagnosis present

## 2022-08-30 DIAGNOSIS — Z801 Family history of malignant neoplasm of trachea, bronchus and lung: Secondary | ICD-10-CM

## 2022-08-30 DIAGNOSIS — E1140 Type 2 diabetes mellitus with diabetic neuropathy, unspecified: Secondary | ICD-10-CM | POA: Diagnosis not present

## 2022-08-30 DIAGNOSIS — R001 Bradycardia, unspecified: Secondary | ICD-10-CM | POA: Diagnosis not present

## 2022-08-30 DIAGNOSIS — I10 Essential (primary) hypertension: Secondary | ICD-10-CM | POA: Diagnosis present

## 2022-08-30 DIAGNOSIS — D696 Thrombocytopenia, unspecified: Secondary | ICD-10-CM | POA: Diagnosis present

## 2022-08-30 DIAGNOSIS — J984 Other disorders of lung: Secondary | ICD-10-CM | POA: Diagnosis not present

## 2022-08-30 DIAGNOSIS — Z885 Allergy status to narcotic agent status: Secondary | ICD-10-CM

## 2022-08-30 DIAGNOSIS — U071 COVID-19: Principal | ICD-10-CM | POA: Diagnosis present

## 2022-08-30 DIAGNOSIS — Z791 Long term (current) use of non-steroidal anti-inflammatories (NSAID): Secondary | ICD-10-CM

## 2022-08-30 DIAGNOSIS — K4020 Bilateral inguinal hernia, without obstruction or gangrene, not specified as recurrent: Secondary | ICD-10-CM | POA: Diagnosis not present

## 2022-08-30 DIAGNOSIS — Z9842 Cataract extraction status, left eye: Secondary | ICD-10-CM

## 2022-08-30 DIAGNOSIS — R531 Weakness: Principal | ICD-10-CM

## 2022-08-30 DIAGNOSIS — E1149 Type 2 diabetes mellitus with other diabetic neurological complication: Secondary | ICD-10-CM | POA: Diagnosis present

## 2022-08-30 DIAGNOSIS — Z9841 Cataract extraction status, right eye: Secondary | ICD-10-CM

## 2022-08-30 DIAGNOSIS — E871 Hypo-osmolality and hyponatremia: Secondary | ICD-10-CM | POA: Diagnosis present

## 2022-08-30 DIAGNOSIS — Z794 Long term (current) use of insulin: Secondary | ICD-10-CM | POA: Diagnosis not present

## 2022-08-30 DIAGNOSIS — D72829 Elevated white blood cell count, unspecified: Secondary | ICD-10-CM | POA: Diagnosis present

## 2022-08-30 DIAGNOSIS — Z91018 Allergy to other foods: Secondary | ICD-10-CM

## 2022-08-30 DIAGNOSIS — Z8249 Family history of ischemic heart disease and other diseases of the circulatory system: Secondary | ICD-10-CM

## 2022-08-30 DIAGNOSIS — Z6841 Body Mass Index (BMI) 40.0 and over, adult: Secondary | ICD-10-CM

## 2022-08-30 DIAGNOSIS — Z981 Arthrodesis status: Secondary | ICD-10-CM

## 2022-08-30 DIAGNOSIS — I959 Hypotension, unspecified: Secondary | ICD-10-CM | POA: Diagnosis not present

## 2022-08-30 DIAGNOSIS — I517 Cardiomegaly: Secondary | ICD-10-CM | POA: Diagnosis not present

## 2022-08-30 DIAGNOSIS — M797 Fibromyalgia: Secondary | ICD-10-CM | POA: Diagnosis present

## 2022-08-30 DIAGNOSIS — Z9049 Acquired absence of other specified parts of digestive tract: Secondary | ICD-10-CM

## 2022-08-30 DIAGNOSIS — G9340 Encephalopathy, unspecified: Secondary | ICD-10-CM | POA: Diagnosis present

## 2022-08-30 DIAGNOSIS — Z888 Allergy status to other drugs, medicaments and biological substances status: Secondary | ICD-10-CM

## 2022-08-30 DIAGNOSIS — R0902 Hypoxemia: Secondary | ICD-10-CM | POA: Diagnosis not present

## 2022-08-30 DIAGNOSIS — Z807 Family history of other malignant neoplasms of lymphoid, hematopoietic and related tissues: Secondary | ICD-10-CM

## 2022-08-30 LAB — CBC WITH DIFFERENTIAL/PLATELET
Abs Immature Granulocytes: 0.07 10*3/uL (ref 0.00–0.07)
Basophils Absolute: 0 10*3/uL (ref 0.0–0.1)
Basophils Relative: 0 %
Eosinophils Absolute: 0 10*3/uL (ref 0.0–0.5)
Eosinophils Relative: 0 %
HCT: 39.2 % (ref 36.0–46.0)
Hemoglobin: 12.8 g/dL (ref 12.0–15.0)
Immature Granulocytes: 1 %
Lymphocytes Relative: 18 %
Lymphs Abs: 2.5 10*3/uL (ref 0.7–4.0)
MCH: 27.8 pg (ref 26.0–34.0)
MCHC: 32.7 g/dL (ref 30.0–36.0)
MCV: 85 fL (ref 80.0–100.0)
Monocytes Absolute: 0.8 10*3/uL (ref 0.1–1.0)
Monocytes Relative: 6 %
Neutro Abs: 10.2 10*3/uL — ABNORMAL HIGH (ref 1.7–7.7)
Neutrophils Relative %: 75 %
Platelets: 148 10*3/uL — ABNORMAL LOW (ref 150–400)
RBC: 4.61 MIL/uL (ref 3.87–5.11)
RDW: 14.9 % (ref 11.5–15.5)
WBC: 13.6 10*3/uL — ABNORMAL HIGH (ref 4.0–10.5)
nRBC: 0 % (ref 0.0–0.2)

## 2022-08-30 LAB — URINALYSIS, W/ REFLEX TO CULTURE (INFECTION SUSPECTED)
Bacteria, UA: NONE SEEN
Bilirubin Urine: NEGATIVE
Glucose, UA: NEGATIVE mg/dL
Hgb urine dipstick: NEGATIVE
Ketones, ur: NEGATIVE mg/dL
Leukocytes,Ua: NEGATIVE
Nitrite: NEGATIVE
Protein, ur: 100 mg/dL — AB
Specific Gravity, Urine: 1.014 (ref 1.005–1.030)
pH: 5 (ref 5.0–8.0)

## 2022-08-30 LAB — I-STAT VENOUS BLOOD GAS, ED
Acid-base deficit: 1 mmol/L (ref 0.0–2.0)
Bicarbonate: 23.9 mmol/L (ref 20.0–28.0)
Calcium, Ion: 1.17 mmol/L (ref 1.15–1.40)
HCT: 40 % (ref 36.0–46.0)
Hemoglobin: 13.6 g/dL (ref 12.0–15.0)
O2 Saturation: 98 %
Potassium: 3.9 mmol/L (ref 3.5–5.1)
Sodium: 132 mmol/L — ABNORMAL LOW (ref 135–145)
TCO2: 25 mmol/L (ref 22–32)
pCO2, Ven: 40.9 mmHg — ABNORMAL LOW (ref 44–60)
pH, Ven: 7.375 (ref 7.25–7.43)
pO2, Ven: 107 mmHg — ABNORMAL HIGH (ref 32–45)

## 2022-08-30 LAB — I-STAT CHEM 8, ED
BUN: 33 mg/dL — ABNORMAL HIGH (ref 8–23)
Calcium, Ion: 1.19 mmol/L (ref 1.15–1.40)
Chloride: 96 mmol/L — ABNORMAL LOW (ref 98–111)
Creatinine, Ser: 1 mg/dL (ref 0.44–1.00)
Glucose, Bld: 96 mg/dL (ref 70–99)
HCT: 39 % (ref 36.0–46.0)
Hemoglobin: 13.3 g/dL (ref 12.0–15.0)
Potassium: 3.9 mmol/L (ref 3.5–5.1)
Sodium: 132 mmol/L — ABNORMAL LOW (ref 135–145)
TCO2: 25 mmol/L (ref 22–32)

## 2022-08-30 LAB — COMPREHENSIVE METABOLIC PANEL
ALT: 26 U/L (ref 0–44)
AST: 23 U/L (ref 15–41)
Albumin: 3.1 g/dL — ABNORMAL LOW (ref 3.5–5.0)
Alkaline Phosphatase: 67 U/L (ref 38–126)
Anion gap: 9 (ref 5–15)
BUN: 33 mg/dL — ABNORMAL HIGH (ref 8–23)
CO2: 25 mmol/L (ref 22–32)
Calcium: 8.7 mg/dL — ABNORMAL LOW (ref 8.9–10.3)
Chloride: 97 mmol/L — ABNORMAL LOW (ref 98–111)
Creatinine, Ser: 0.93 mg/dL (ref 0.44–1.00)
GFR, Estimated: >60 mL/min (ref >60)
Glucose, Bld: 96 mg/dL (ref 70–99)
Potassium: 3.8 mmol/L (ref 3.5–5.1)
Sodium: 131 mmol/L — ABNORMAL LOW (ref 135–145)
Total Bilirubin: 0.8 mg/dL (ref 0.3–1.2)
Total Protein: 6 g/dL — ABNORMAL LOW (ref 6.5–8.1)

## 2022-08-30 LAB — RESP PANEL BY RT-PCR (RSV, FLU A&B, COVID)  RVPGX2
Influenza A by PCR: NEGATIVE
Influenza B by PCR: NEGATIVE
Resp Syncytial Virus by PCR: NEGATIVE
SARS Coronavirus 2 by RT PCR: POSITIVE — AB

## 2022-08-30 LAB — CBG MONITORING, ED
Glucose-Capillary: 97 mg/dL (ref 70–99)
Glucose-Capillary: 121 mg/dL — ABNORMAL HIGH (ref 70–99)

## 2022-08-30 LAB — GLUCOSE, CAPILLARY: Glucose-Capillary: 113 mg/dL — ABNORMAL HIGH (ref 70–99)

## 2022-08-30 LAB — MAGNESIUM: Magnesium: 2 mg/dL (ref 1.7–2.4)

## 2022-08-30 LAB — I-STAT CG4 LACTIC ACID, ED
Lactic Acid, Venous: 0.6 mmol/L (ref 0.5–1.9)
Lactic Acid, Venous: 0.7 mmol/L (ref 0.5–1.9)

## 2022-08-30 LAB — TSH: TSH: 0.527 u[IU]/mL (ref 0.350–4.500)

## 2022-08-30 LAB — TROPONIN I (HIGH SENSITIVITY)
Troponin I (High Sensitivity): 12 ng/L (ref <18)
Troponin I (High Sensitivity): 11 ng/L (ref <18)

## 2022-08-30 MED ORDER — PREGABALIN 75 MG PO CAPS
75.0000 mg | ORAL_CAPSULE | Freq: Three times a day (TID) | ORAL | Status: DC
  Administered 2022-08-31 – 2022-09-02 (×8): 75 mg via ORAL
  Filled 2022-08-30 (×8): qty 1

## 2022-08-30 MED ORDER — SERTRALINE HCL 25 MG PO TABS
25.0000 mg | ORAL_TABLET | Freq: Every day | ORAL | Status: DC
  Administered 2022-08-31 – 2022-09-02 (×3): 25 mg via ORAL
  Filled 2022-08-30 (×3): qty 1

## 2022-08-30 MED ORDER — DULOXETINE HCL 60 MG PO CPEP
60.0000 mg | ORAL_CAPSULE | Freq: Every day | ORAL | Status: DC
  Administered 2022-08-31 – 2022-09-02 (×3): 60 mg via ORAL
  Filled 2022-08-30 (×3): qty 1

## 2022-08-30 MED ORDER — NOREPINEPHRINE 4 MG/250ML-% IV SOLN
0.0000 ug/min | INTRAVENOUS | Status: DC
  Administered 2022-08-30: 2 ug/min via INTRAVENOUS
  Filled 2022-08-30: qty 250

## 2022-08-30 MED ORDER — ACETAMINOPHEN 325 MG PO TABS: 650.0000 mg | ORAL_TABLET | Freq: Once | ORAL | Status: DC

## 2022-08-30 MED ORDER — POLYVINYL ALCOHOL 1.4 % OP SOLN: 1.0000 [drp] | OPHTHALMIC | Status: DC | PRN

## 2022-08-30 MED ORDER — NIRMATRELVIR/RITONAVIR (PAXLOVID)TABLET
3.0000 | ORAL_TABLET | Freq: Two times a day (BID) | ORAL | Status: DC
  Administered 2022-08-31 – 2022-09-02 (×5): 3 via ORAL
  Filled 2022-08-30: qty 30

## 2022-08-30 MED ORDER — PANTOPRAZOLE SODIUM 40 MG PO TBEC
80.0000 mg | DELAYED_RELEASE_TABLET | Freq: Every day | ORAL | Status: DC
  Administered 2022-08-31 – 2022-09-02 (×3): 80 mg via ORAL
  Filled 2022-08-30 (×4): qty 2

## 2022-08-30 MED ORDER — INSULIN ASPART 100 UNIT/ML IJ SOLN
4.0000 [IU] | Freq: Three times a day (TID) | INTRAMUSCULAR | Status: DC
  Administered 2022-08-31 – 2022-09-02 (×4): 4 [IU] via SUBCUTANEOUS

## 2022-08-30 MED ORDER — INSULIN ASPART 100 UNIT/ML IJ SOLN
0.0000 [IU] | Freq: Three times a day (TID) | INTRAMUSCULAR | Status: DC
  Administered 2022-09-01: 2 [IU] via SUBCUTANEOUS
  Administered 2022-09-01: 3 [IU] via SUBCUTANEOUS
  Administered 2022-09-02: 2 [IU] via SUBCUTANEOUS
  Administered 2022-09-02: 5 [IU] via SUBCUTANEOUS

## 2022-08-30 MED ORDER — MELOXICAM 7.5 MG PO TABS
15.0000 mg | ORAL_TABLET | Freq: Every day | ORAL | Status: DC
  Administered 2022-08-31 – 2022-09-02 (×3): 15 mg via ORAL
  Filled 2022-08-30 (×3): qty 2

## 2022-08-30 MED ORDER — LIDOCAINE 4 % EX PTCH
1.0000 | MEDICATED_PATCH | Freq: Every day | CUTANEOUS | Status: DC
  Filled 2022-08-30 (×2): qty 1

## 2022-08-30 MED ORDER — ACETAMINOPHEN 650 MG RE SUPP: 650.0000 mg | Freq: Four times a day (QID) | RECTAL | Status: DC | PRN

## 2022-08-30 MED ORDER — POLYETHYLENE GLYCOL 3350 17 GM/SCOOP PO POWD
17.0000 g | Freq: Every day | ORAL | Status: DC
  Administered 2022-08-31: 17 g via ORAL
  Filled 2022-08-30: qty 255

## 2022-08-30 MED ORDER — INSULIN GLARGINE-YFGN 100 UNIT/ML ~~LOC~~ SOLN
10.0000 [IU] | Freq: Every day | SUBCUTANEOUS | Status: DC
  Administered 2022-08-31 – 2022-09-01 (×2): 10 [IU] via SUBCUTANEOUS
  Filled 2022-08-30 (×5): qty 0.1

## 2022-08-30 MED ORDER — IOHEXOL 350 MG/ML SOLN
75.0000 mL | Freq: Once | INTRAVENOUS | Status: AC | PRN
  Administered 2022-08-30: 75 mL via INTRAVENOUS

## 2022-08-30 MED ORDER — CARBOXYMETHYLCELLULOSE SODIUM 0.5 % OP SOLN: 1.0000 [drp] | Freq: Two times a day (BID) | OPHTHALMIC | Status: DC

## 2022-08-30 MED ORDER — EZETIMIBE 10 MG PO TABS
10.0000 mg | ORAL_TABLET | Freq: Every day | ORAL | Status: DC
  Administered 2022-08-31 – 2022-09-02 (×3): 10 mg via ORAL
  Filled 2022-08-30 (×3): qty 1

## 2022-08-30 MED ORDER — INSULIN ASPART 100 UNIT/ML IJ SOLN: 0.0000 [IU] | Freq: Every day | INTRAMUSCULAR | Status: DC

## 2022-08-30 MED ORDER — ONDANSETRON HCL 4 MG/2ML IJ SOLN: 4.0000 mg | Freq: Four times a day (QID) | INTRAMUSCULAR | Status: DC | PRN

## 2022-08-30 MED ORDER — DOCUSATE SODIUM 100 MG PO CAPS
100.0000 mg | ORAL_CAPSULE | Freq: Every day | ORAL | Status: DC
  Administered 2022-08-31 – 2022-09-01 (×2): 100 mg via ORAL
  Filled 2022-08-30 (×2): qty 1

## 2022-08-30 MED ORDER — DICLOFENAC SODIUM 1 % EX GEL
4.0000 g | Freq: Two times a day (BID) | CUTANEOUS | Status: DC
  Administered 2022-08-31 – 2022-09-02 (×5): 4 g via TOPICAL
  Filled 2022-08-30: qty 100

## 2022-08-30 MED ORDER — ROSUVASTATIN CALCIUM 20 MG PO TABS
20.0000 mg | ORAL_TABLET | Freq: Every evening | ORAL | Status: DC
  Administered 2022-08-31 – 2022-09-01 (×2): 20 mg via ORAL
  Filled 2022-08-30 (×2): qty 1

## 2022-08-30 MED ORDER — MIRABEGRON ER 50 MG PO TB24
50.0000 mg | ORAL_TABLET | Freq: Every day | ORAL | Status: DC
  Administered 2022-08-31 – 2022-09-02 (×3): 50 mg via ORAL
  Filled 2022-08-30 (×3): qty 1

## 2022-08-30 MED ORDER — ENOXAPARIN SODIUM 40 MG/0.4ML IJ SOSY
40.0000 mg | PREFILLED_SYRINGE | INTRAMUSCULAR | Status: DC
  Administered 2022-08-31 – 2022-09-01 (×2): 40 mg via SUBCUTANEOUS
  Filled 2022-08-30 (×2): qty 0.4

## 2022-08-30 MED ORDER — ONDANSETRON HCL 4 MG PO TABS: 4.0000 mg | ORAL_TABLET | Freq: Four times a day (QID) | ORAL | Status: DC | PRN

## 2022-08-30 MED ORDER — ACETAMINOPHEN 325 MG PO TABS
650.0000 mg | ORAL_TABLET | Freq: Four times a day (QID) | ORAL | Status: DC | PRN
  Filled 2022-08-30: qty 2

## 2022-08-30 MED ORDER — LACTATED RINGERS IV BOLUS (SEPSIS)
500.0000 mL | Freq: Once | INTRAVENOUS | Status: AC
  Administered 2022-08-30: 500 mL via INTRAVENOUS

## 2022-08-30 MED ORDER — IPRATROPIUM-ALBUTEROL 0.5-2.5 (3) MG/3ML IN SOLN: 3.0000 mL | Freq: Two times a day (BID) | RESPIRATORY_TRACT | Status: DC | PRN

## 2022-08-30 MED ORDER — ASPIRIN 81 MG PO TBEC
81.0000 mg | DELAYED_RELEASE_TABLET | Freq: Every day | ORAL | Status: DC
  Administered 2022-08-31 – 2022-09-01 (×2): 81 mg via ORAL
  Filled 2022-08-30 (×2): qty 1

## 2022-08-30 MED ORDER — HYDROCODONE-ACETAMINOPHEN 5-325 MG PO TABS: 1.0000 | ORAL_TABLET | Freq: Three times a day (TID) | ORAL | Status: DC | PRN

## 2022-08-30 NOTE — ED Triage Notes (Signed)
Patient BIB EMS from [facility], A&O x4, lethargic x few days. SBP 80, patient on hypertension medications. Placed on o2 due to decreasing SPO2 while sleeping.. 109/61, 52, 92%, CBG 90

## 2022-08-30 NOTE — Assessment & Plan Note (Signed)
Looks like pt takes: 10u Lantus and Novolog 12u in AM and noon, 16u at dinner. Cont lantus 10 Hold novolog TID Instead use novolog: 4u mealtimes + mod scale SSI AC/HS

## 2022-08-30 NOTE — ED Notes (Signed)
ED TO INPATIENT HANDOFF REPORT  ED Nurse Name and Phone #:   S Name/Age/Gender [name] 82 y.o. female Room/Bed: 002C/002C  Code Status   Code Status: Full Code  Home/SNF/Other Home Patient oriented to: self, place, time, and situation Is this baseline? Yes   Triage Complete: Triage complete  Chief Complaint COVID-19 [U07.1]  Triage Note Patient BIB EMS from [facility], A&O x4, lethargic x few days. SBP 80, patient on hypertension medications. Placed on o2 due to decreasing SPO2 while sleeping.. 109/61, 52, 92%, CBG 90   Allergies Allergies  Allergen Reactions   Amoxil [Amoxicillin] Swelling    Lip swelling   Zestril [Lisinopril] Itching and Swelling   Chocolate Hives   Augmentin [Amoxicillin-Pot Clavulanate] Swelling   Demerol [Meperidine Hcl] Nausea And Vomiting   Other Other (See Comments)    Raw food or nuts - GI pain    Level of Care/Admitting Diagnosis ED Disposition     ED Disposition  Admit   Condition  --   Comment  Hospital Area: [facility] [[id number]]  Level of Care: Progressive [102]  Admit to Progressive based on following criteria: MULTISYSTEM THREATS such as stable sepsis, metabolic/electrolyte imbalance with or without encephalopathy that is responding to early treatment.  May place patient in observation at [facility] or [facility] if equivalent level of care is available:: No  Covid Evaluation: Asymptomatic - no recent exposure (last 10 days) testing not required  Diagnosis: COVID-19 [[id number]]  Admitting Physician: Hillary Bow [[id number]]  Attending Physician: Hillary Bow [[id number]]          B Medical/Surgery History Past Medical History:  Diagnosis Date   Anemia    during 1st pregnancy   Anxiety    Arthritis    Cancer (HCC)    Basal - face   Complication of anesthesia    Depression    Diarrhea, functional    Dysrhythmia    Paroysmal Atrial Tachycardia   Essential hypertension     Fibromyalgia    GERD (gastroesophageal reflux disease)    H/O acute pancreatitis    Headache    History of hiatal hernia    Hyperlipemia    Neuropathy    Osteoporosis    Panic attacks    Paralysis (HCC) 01/06/2019   hands   PAT (paroxysmal atrial tachycardia)    Pneumonia    PONV (postoperative nausea and vomiting)    PSVT (paroxysmal supraventricular tachycardia)    Reflux    Restless leg syndrome    Spinal cord compression (HCC)    Type 2 diabetes mellitus (HCC)    Type II   Vertigo    not current   Past Surgical History:  Procedure Laterality Date   ABDOMINAL HYSTERECTOMY     ANKLE SURGERY Left    x 2   ANTERIOR CERVICAL DECOMP/DISCECTOMY FUSION N/A 11/12/2017   Procedure: Cervical seven to Thoracic one  Anterior cervical decompression/discectomy/fusion with removal of old plate;  Surgeon: Barnett Abu, MD;  Location: [facility] OR;  Service: Neurosurgery;  Laterality: N/A;   BACK SURGERY     T12-L1 fusion, Anterior Cervical fusion   CARDIAC CATHETERIZATION N/A 02/16/2015   Procedure: Left Heart Cath and Coronary Angiography;  Surgeon: Tonny Bollman, MD;  Location: [facility] INVASIVE CV LAB;  Service: Cardiovascular;  Laterality: N/A;   CHOLECYSTECTOMY     COLONOSCOPY     ESOPHAGOGASTRODUODENOSCOPY  12/21/2010   Procedure: ESOPHAGOGASTRODUODENOSCOPY (EGD);  Surgeon: Freddy Jaksch, MD;  Location:  [facility] ENDOSCOPY;  Service: Endoscopy;  Laterality: N/A;   EXCISION MORTON'S NEUROMA Right    EYE SURGERY Bilateral    Cataract   FRACTURE SURGERY     ankle x 2    KNEE SURGERY Right    x 3- arthroscopy   NECK SURGERY     POSTERIOR CERVICAL FUSION/FORAMINOTOMY N/A 01/22/2018   Procedure: Cervical six-seven Posterior Decompression with Posterior Fixation Cervical Five to Thoracic Two;  Surgeon: Barnett Abu, MD;  Location: [facility] OR;  Service: Neurosurgery;  Laterality: N/A;  Cervical 6-7 Posteror decompression with posterior fixation Cervical 5 to Thoracic 2   ROTATOR CUFF REPAIR Right     TONSILLECTOMY     TOTAL KNEE ARTHROPLASTY Left 04/10/2017   Procedure: LEFT TOTAL KNEE ARTHROPLASTY;  Surgeon: Durene Romans, MD;  Location: [facility] ORS;  Service: Orthopedics;  Laterality: Left;  70 mins   UPPER GASTROINTESTINAL ENDOSCOPY     WRIST SURGERY Left    fracture     A IV Location/Drains/Wounds Patient Lines/Drains/Airways Status     Active Line/Drains/Airways     Name Placement date Placement time Site Days   Peripheral IV 08/30/22 18 G Anterior;Left;Proximal Forearm 08/30/22  1320  Forearm  less than 1            Intake/Output Last 24 hours  Intake/Output Summary (Last 24 hours) at 08/30/2022 2053 Last data filed at 08/30/2022 1600 Gross per 24 hour  Intake --  Output 350 ml  Net -350 ml    Labs/Imaging Results for orders placed or performed during the hospital encounter of 08/30/22 (from the past 48 hour(s))  Comprehensive metabolic panel     Status: Abnormal   Collection Time: 08/30/22  1:07 PM  Result Value Ref Range   Sodium 131 (L) 135 - 145 mmol/L   Potassium 3.8 3.5 - 5.1 mmol/L   Chloride 97 (L) 98 - 111 mmol/L   CO2 25 22 - 32 mmol/L   Glucose, Bld 96 70 - 99 mg/dL    Comment: Glucose reference range applies only to samples taken after fasting for at least 8 hours.   BUN 33 (H) 8 - 23 mg/dL   Creatinine, Ser 0.10 0.44 - 1.00 mg/dL   Calcium 8.7 (L) 8.9 - 10.3 mg/dL   Total Protein 6.0 (L) 6.5 - 8.1 g/dL   Albumin 3.1 (L) 3.5 - 5.0 g/dL   AST 23 15 - 41 U/L   ALT 26 0 - 44 U/L   Alkaline Phosphatase 67 38 - 126 U/L   Total Bilirubin 0.8 0.3 - 1.2 mg/dL   GFR, Estimated >27 >25 mL/min    Comment: (NOTE) Calculated using the CKD-EPI Creatinine Equation (2021)    Anion gap 9 5 - 15    Comment: Performed at [facility], [address], [place], [place] [address]  CBC with Differential     Status: Abnormal   Collection Time: 08/30/22  1:07 PM  Result Value Ref Range   WBC 13.6 (H) 4.0 - 10.5 K/uL   RBC 4.61 3.87 - 5.11 MIL/uL   Hemoglobin  12.8 12.0 - 15.0 g/dL   HCT 03.4 74.2 - 59.5 %   MCV 85.0 80.0 - 100.0 fL   MCH 27.8 26.0 - 34.0 pg   MCHC 32.7 30.0 - 36.0 g/dL   RDW 63.8 75.6 - 43.3 %   Platelets 148 (L) 150 - 400 K/uL   nRBC 0.0 0.0 - 0.2 %   Neutrophils Relative % 75 %  Neutro Abs 10.2 (H) 1.7 - 7.7 K/uL   Lymphocytes Relative 18 %   Lymphs Abs 2.5 0.7 - 4.0 K/uL   Monocytes Relative 6 %   Monocytes Absolute 0.8 0.1 - 1.0 K/uL   Eosinophils Relative 0 %   Eosinophils Absolute 0.0 0.0 - 0.5 K/uL   Basophils Relative 0 %   Basophils Absolute 0.0 0.0 - 0.1 K/uL   Immature Granulocytes 1 %   Abs Immature Granulocytes 0.07 0.00 - 0.07 K/uL    Comment: Performed at [facility], [address], [place], [place] [address]  Troponin I (High Sensitivity)     Status: None   Collection Time: 08/30/22  1:07 PM  Result Value Ref Range   Troponin I (High Sensitivity) 12 <18 ng/L    Comment: (NOTE) Elevated high sensitivity troponin I (hsTnI) values and significant  changes across serial measurements may suggest ACS but many other  chronic and acute conditions are known to elevate hsTnI results.  Refer to the "Links" section for chest pain algorithms and additional  guidance. Performed at [facility], [address] [place], [place] [address]   Magnesium     Status: None   Collection Time: 08/30/22  1:07 PM  Result Value Ref Range   Magnesium 2.0 1.7 - 2.4 mg/dL    Comment: Performed at [facility], [address] [place], [place] [address]  POC CBG, ED     Status: None   Collection Time: 08/30/22  1:35 PM  Result Value Ref Range   Glucose-Capillary 97 70 - 99 mg/dL    Comment: Glucose reference range applies only to samples taken after fasting for at least 8 hours.  I-Stat Chem 8, ED     Status: Abnormal   Collection Time: 08/30/22  1:36 PM  Result Value Ref Range   Sodium 132 (L) 135 - 145 mmol/L   Potassium 3.9 3.5 - 5.1 mmol/L   Chloride 96 (L) 98 - 111 mmol/L   BUN 33 (H) 8 - 23  mg/dL   Creatinine, Ser 5.63 0.44 - 1.00 mg/dL   Glucose, Bld 96 70 - 99 mg/dL    Comment: Glucose reference range applies only to samples taken after fasting for at least 8 hours.   Calcium, Ion 1.19 1.15 - 1.40 mmol/L   TCO2 25 22 - 32 mmol/L   Hemoglobin 13.3 12.0 - 15.0 g/dL   HCT 87.5 64.3 - 32.9 %  I-Stat Lactic Acid, ED     Status: None   Collection Time: 08/30/22  1:39 PM  Result Value Ref Range   Lactic Acid, Venous 0.6 0.5 - 1.9 mmol/L  I-Stat venous blood gas, ED ([facility])     Status: Abnormal   Collection Time: 08/30/22  1:44 PM  Result Value Ref Range   pH, Ven 7.375 7.25 - 7.43   pCO2, Ven 40.9 (L) 44 - 60 mmHg   pO2, Ven 107 (H) 32 - 45 mmHg   Bicarbonate 23.9 20.0 - 28.0 mmol/L   TCO2 25 22 - 32 mmol/L   O2 Saturation 98 %   Acid-base deficit 1.0 0.0 - 2.0 mmol/L   Sodium 132 (L) 135 - 145 mmol/L   Potassium 3.9 3.5 - 5.1 mmol/L   Calcium, Ion 1.17 1.15 - 1.40 mmol/L   HCT 40.0 36.0 - 46.0 %   Hemoglobin 13.6 12.0 - 15.0 g/dL   Sample type VENOUS   Resp panel by RT-PCR (RSV, Flu A&B, Covid)  Anterior Nasal Swab     Status: Abnormal   Collection Time: 08/30/22  2:42 PM   Specimen: Anterior Nasal Swab  Result Value Ref Range   SARS Coronavirus 2 by RT PCR POSITIVE (A) NEGATIVE   Influenza A by PCR NEGATIVE NEGATIVE   Influenza B by PCR NEGATIVE NEGATIVE    Comment: (NOTE) The Xpert Xpress SARS-CoV-2/FLU/RSV plus assay is intended as an aid in the diagnosis of influenza from Nasopharyngeal swab specimens and should not be used as a sole basis for treatment. Nasal washings and aspirates are unacceptable for Xpert Xpress SARS-CoV-2/FLU/RSV testing.  Fact Sheet for Patients: BloggerCourse.com  Fact Sheet for Healthcare Providers: SeriousBroker.it  This test is not yet approved or cleared by the [place] FDA and has been authorized for detection and/or diagnosis of SARS-CoV-2 by FDA under an Emergency Use  Authorization (EUA). This EUA will remain in effect (meaning this test can be used) for the duration of the COVID-19 declaration under Section 564(b)(1) of the Act, 21 U.S.C. section 360bbb-3(b)(1), unless the authorization is terminated or revoked.     Resp Syncytial Virus by PCR NEGATIVE NEGATIVE    Comment: (NOTE) Fact Sheet for Patients: BloggerCourse.com  Fact Sheet for Healthcare Providers: SeriousBroker.it  This test is not yet approved or cleared by the [place] FDA and has been authorized for detection and/or diagnosis of SARS-CoV-2 by FDA under an Emergency Use Authorization (EUA). This EUA will remain in effect (meaning this test can be used) for the duration of the COVID-19 declaration under Section 564(b)(1) of the Act, 21 U.S.C. section 360bbb-3(b)(1), unless the authorization is terminated or revoked.  Performed at [facility], [address] [place], [place] [address]   Troponin I (High Sensitivity)     Status: None   Collection Time: 08/30/22  3:45 PM  Result Value Ref Range   Troponin I (High Sensitivity) 11 <18 ng/L    Comment: (NOTE) Elevated high sensitivity troponin I (hsTnI) values and significant  changes across serial measurements may suggest ACS but many other  chronic and acute conditions are known to elevate hsTnI results.  Refer to the "Links" section for chest pain algorithms and additional  guidance. Performed at [facility], [address], [place], [place] [address]   TSH     Status: None   Collection Time: 08/30/22  3:45 PM  Result Value Ref Range   TSH 0.527 0.350 - 4.500 uIU/mL    Comment: Performed by a 3rd Generation assay with a functional sensitivity of <=0.01 uIU/mL. Performed at [facility], [address], [place], [place] [address]   I-Stat Lactic Acid, ED     Status: None   Collection Time: 08/30/22  4:13 PM  Result Value Ref Range   Lactic Acid,  Venous 0.7 0.5 - 1.9 mmol/L  Urinalysis, w/ Reflex to Culture (Infection Suspected) -Urine, Clean Catch     Status: Abnormal   Collection Time: 08/30/22  4:18 PM  Result Value Ref Range   Specimen Source URINE, CLEAN CATCH    Color, Urine YELLOW YELLOW   APPearance CLEAR CLEAR   Specific Gravity, Urine 1.014 1.005 - 1.030   pH 5.0 5.0 - 8.0   Glucose, UA NEGATIVE NEGATIVE mg/dL   Hgb urine dipstick NEGATIVE NEGATIVE   Bilirubin Urine NEGATIVE NEGATIVE   Ketones, ur NEGATIVE NEGATIVE mg/dL   Protein, ur 696 (A) NEGATIVE mg/dL   Nitrite NEGATIVE NEGATIVE   Leukocytes,Ua NEGATIVE NEGATIVE   RBC /  HPF 0-5 0 - 5 RBC/hpf   WBC, UA 0-5 0 - 5 WBC/hpf    Comment:        Reflex urine culture not performed if WBC <=10, OR if Squamous epithelial cells >5. If Squamous epithelial cells >5 suggest recollection.    Bacteria, UA NONE SEEN NONE SEEN   Squamous Epithelial / HPF 0-5 0 - 5 /HPF   Mucus PRESENT     Comment: Performed at [facility], [address], [place], [place] [address]  CBG monitoring, ED     Status: Abnormal   Collection Time: 08/30/22  5:34 PM  Result Value Ref Range   Glucose-Capillary 121 (H) 70 - 99 mg/dL    Comment: Glucose reference range applies only to samples taken after fasting for at least 8 hours.   CT ABDOMEN PELVIS W CONTRAST  Result Date: 08/30/2022 CLINICAL DATA:  Abdominal pain, lethargy EXAM: CT ABDOMEN AND PELVIS WITH CONTRAST TECHNIQUE: Multidetector CT imaging of the abdomen and pelvis was performed using the standard protocol following bolus administration of intravenous contrast. RADIATION DOSE REDUCTION: This exam was performed according to the departmental dose-optimization program which includes automated exposure control, adjustment of the mA and/or kV according to patient size and/or use of iterative reconstruction technique. CONTRAST:  75mL OMNIPAQUE IOHEXOL 350 MG/ML SOLN COMPARISON:  08/10/2017 FINDINGS: Motion artifacts are seen in multiple  images limiting the study. Lower chest: Small linear densities are seen in lower lung fields. Motion artifacts limit evaluation. Hepatobiliary: Surgical clips are seen in gallbladder fossa. There is slight prominence of the intrahepatic bile ducts. There is no significant dilation of distal common bile duct. Pancreas: Motion artifacts limit evaluation. No definite focal abnormalities are seen. Spleen: Unremarkable. Adrenals/Urinary Tract: There is 1.5 cm nodule in left adrenal with no significant interval change. Motion artifacts limit evaluation of density measurements. There is no hydronephrosis. Evaluation of the renal cortex is limited by motion artifacts. Vascular calcifications are seen. No definite demonstrable renal or ureteral stones are seen. Urinary bladder is not distended. There is mild diffuse wall thickening in the bladder. Stomach/Bowel: Stomach is not distended. Small bowel loops are not dilated. Appendix is not adequately visualized. In image 57 of series 3, there is a small caliber tubular structure in the pericecal region, possibly normal appendix. There is no pericecal stranding. There is no significant wall thickening in colon. There is no pericolic stranding. Vascular/Lymphatic: Calcifications are seen in aorta and its major branches. Reproductive: Uterus is not seen. Other: There is no ascites or pneumoperitoneum. Umbilical hernia containing fat is seen. Bilateral inguinal hernias containing fat are seen. Musculoskeletal: There is posterior surgical fusion from T10-L1 levels. Degenerative changes are noted with encroachment of neural foramina from L1-S1 levels. Spinal stenosis is noted from L2-L5 levels, more severe at L2-L3 level. IMPRESSION: There is no evidence of intestinal obstruction or pneumoperitoneum. There is no hydronephrosis. Lumbar spondylosis with spinal stenosis and encroachment of neural foramina at multiple levels. Other chronic findings as described in the body of the  report. Electronically Signed   By: Ernie Avena M.D.   On: 08/30/2022 18:27   CT Angio Chest PE W and/or Wo Contrast  Result Date: 08/30/2022 CLINICAL DATA:  Lethargy, hypoxia EXAM: CT ANGIOGRAPHY CHEST WITH CONTRAST TECHNIQUE: Multidetector CT imaging of the chest was performed using the standard protocol during bolus administration of intravenous contrast. Multiplanar CT image reconstructions and MIPs were obtained to evaluate the vascular anatomy. RADIATION DOSE REDUCTION: This exam was performed according  to the departmental dose-optimization program which includes automated exposure control, adjustment of the mA and/or kV according to patient size and/or use of iterative reconstruction technique. CONTRAST:  75mL OMNIPAQUE IOHEXOL 350 MG/ML SOLN COMPARISON:  Previous studies including CT done on 09/17/2019 and chest radiograph done on 08/30/2022 FINDINGS: Cardiovascular: There are no intraluminal filling defects in pulmonary artery branches. There is homogeneous enhancement in thoracic aorta. There are scattered calcifications in thoracic aorta and its major branches. Coronary artery calcifications are seen. Heart is enlarged in size. Mediastinum/Nodes: No significant lymphadenopathy is seen. Thyroid appears smaller than usual in size. Lungs/Pleura: Small amount of mucus is seen in the right lateral wall of trachea close to bifurcation. There is narrowing of AP diameter of mainstem bronchi. There are no signs of alveolar pulmonary edema. There are small linear densities in both lower lung fields. There is no focal consolidation. There is possible minimal right pleural effusion. There is no evidence of left pleural effusion. There is no pneumothorax. Upper Abdomen: Surgical clips are seen in gallbladder fossa. Musculoskeletal: There is surgical fusion in cervical spine and thoracolumbar junction. Degenerative changes are noted with bony spurs in thoracic spine. Review of the MIP images confirms the  above findings. IMPRESSION: There is no evidence of pulmonary embolism. There is no evidence of thoracic aortic dissection. Small amount of mucus is seen in the lumen of trachea close to bifurcation. There is narrowing of AP diameter mainstem bronchi suggesting possible bronchospasm. Aortic arteriosclerosis.  Coronary artery calcifications are seen. There are small linear densities in both lower lung fields suggesting scarring and subsegmental atelectasis. There is no focal pulmonary consolidation. Possible minimal right pleural effusion. Surgical fusion in cervical spine and thoracolumbar junction. Electronically Signed   By: Ernie Avena M.D.   On: 08/30/2022 18:14   CT Head Wo Contrast  Result Date: 08/30/2022 CLINICAL DATA:  Mental status change, unknown cause EXAM: CT HEAD WITHOUT CONTRAST TECHNIQUE: Contiguous axial images were obtained from the base of the skull through the vertex without intravenous contrast. RADIATION DOSE REDUCTION: This exam was performed according to the departmental dose-optimization program which includes automated exposure control, adjustment of the mA and/or kV according to patient size and/or use of iterative reconstruction technique. COMPARISON:  02/15/2021 FINDINGS: Brain: No evidence of acute infarction, hemorrhage, mass, mass effect, or midline shift. No hydrocephalus or extra-axial fluid collection. Periventricular white matter changes, likely the sequela of chronic small vessel ischemic disease. Vascular: No hyperdense vessel. Atherosclerotic calcifications in the intracranial carotid and vertebral arteries. Skull: Negative for fracture or focal lesion. Sinuses/Orbits: Mucous retention cyst in the right maxillary sinus. No acute finding in the orbits. Other: The mastoid air cells are well aerated. IMPRESSION: No acute intracranial process. Electronically Signed   By: Wiliam Ke M.D.   On: 08/30/2022 18:05   DG Chest Port 1 View  Result Date:  08/30/2022 CLINICAL DATA:  Lethargy, questionable sepsis EXAM: PORTABLE CHEST 1 VIEW COMPARISON:  02/15/2021 FINDINGS: Cardiomegaly. Both lungs are clear. The visualized skeletal structures are unremarkable. IMPRESSION: Cardiomegaly without acute abnormality of the lungs in AP portable projection. Electronically Signed   By: Jearld Lesch M.D.   On: 08/30/2022 14:30    Pending Labs Unresulted Labs (From admission, onward)     Start     Ordered   08/31/22 0500  CBC  Tomorrow morning,   R        08/30/22 1950   08/31/22 0500  Comprehensive metabolic panel  Tomorrow morning,   R  08/30/22 1950   08/31/22 0500  Cortisol-am, blood  Tomorrow morning,   R        08/30/22 1950   08/30/22 2004  Hemoglobin A1c  Once,   AD       Comments: To assess prior glycemic control    08/30/22 2003   08/30/22 1307  Blood Culture (routine x 2)  (Undifferentiated presentation (screening labs and basic nursing orders))  BLOOD CULTURE X 2,   STAT      08/30/22 1307   08/30/22 1307  Brain natriuretic peptide  (Undifferentiated presentation (screening labs and basic nursing orders))  ONCE - URGENT,   URGENT        08/30/22 1307            Vitals/Pain Today's Vitals   08/30/22 1900 08/30/22 1915 08/30/22 1930 08/30/22 1945  BP: (!) 144/116 136/80 (!) 150/69 (!) 141/79  Pulse: (!) 58 (!) 58 (!) 58 64  Resp:  15 17   Temp:      TempSrc:      SpO2: 99% 100% 99% 100%    Isolation Precautions Airborne and Contact precautions  Medications Medications  acetaminophen (TYLENOL) tablet 650 mg (650 mg Oral Not Given 08/30/22 1350)  nirmatrelvir/ritonavir (PAXLOVID) 3 tablet (has no administration in time range)  enoxaparin (LOVENOX) injection 40 mg (has no administration in time range)  acetaminophen (TYLENOL) tablet 650 mg (has no administration in time range)    Or  acetaminophen (TYLENOL) suppository 650 mg (has no administration in time range)  ondansetron (ZOFRAN) tablet 4 mg (has no  administration in time range)    Or  ondansetron (ZOFRAN) injection 4 mg (has no administration in time range)  insulin glargine-yfgn (SEMGLEE) injection 10 Units (has no administration in time range)  insulin aspart (novoLOG) injection 0-5 Units (has no administration in time range)  insulin aspart (novoLOG) injection 0-15 Units (has no administration in time range)  insulin aspart (novoLOG) injection 4 Units (has no administration in time range)  DULoxetine (CYMBALTA) DR capsule 60 mg (has no administration in time range)  docusate sodium (COLACE) capsule 100 mg (has no administration in time range)  diclofenac Sodium (VOLTAREN) 1 % topical gel 4 g (has no administration in time range)  aspirin EC tablet 81 mg (has no administration in time range)  ipratropium-albuterol (DUONEB) 0.5-2.5 (3) MG/3ML nebulizer solution 3 mL (has no administration in time range)  pregabalin (LYRICA) capsule 75 mg (has no administration in time range)  mirabegron ER (MYRBETRIQ) tablet 50 mg (has no administration in time range)  sertraline (ZOLOFT) tablet 25 mg (has no administration in time range)  rosuvastatin (CRESTOR) tablet 20 mg (has no administration in time range)  polyethylene glycol powder (GLYCOLAX/MIRALAX) container 17 g (has no administration in time range)  meloxicam (MOBIC) tablet 15 mg (has no administration in time range)  lidocaine 4 % 1 patch (has no administration in time range)  ezetimibe (ZETIA) tablet 10 mg (has no administration in time range)  pantoprazole (PROTONIX) EC tablet 80 mg (has no administration in time range)  polyvinyl alcohol (LIQUIFILM TEARS) 1.4 % ophthalmic solution 1 drop (has no administration in time range)  HYDROcodone-acetaminophen (NORCO/VICODIN) 5-325 MG per tablet 1 tablet (has no administration in time range)  lactated ringers bolus 500 mL (0 mLs Intravenous Stopped 08/30/22 1426)  iohexol (OMNIPAQUE) 350 MG/ML injection 75 mL (75 mLs Intravenous Contrast Given  08/30/22 1758)    Mobility non-ambulatory     Focused Assessments    R  Recommendations: See Admitting Provider Note  Report given to:   Additional Notes:

## 2022-08-30 NOTE — ED Provider Notes (Signed)
  Physical Exam  BP (!) 141/79   Pulse 64   Temp 98.5 F (36.9 C)   Resp 17   SpO2 100%   Physical Exam Vitals and nursing note reviewed.  Constitutional:      General: She is not in acute distress.    Appearance: She is well-developed.  HENT:     Head: Normocephalic and atraumatic.  Eyes:     Conjunctiva/sclera: Conjunctivae normal.  Cardiovascular:     Rate and Rhythm: Normal rate and regular rhythm.     Heart sounds: No murmur heard. Pulmonary:     Effort: Pulmonary effort is normal. No respiratory distress.     Breath sounds: Rhonchi present.  Abdominal:     Palpations: Abdomen is soft.     Tenderness: There is no abdominal tenderness. There is no guarding or rebound.  Musculoskeletal:        General: No swelling.     Cervical back: Neck supple.  Skin:    General: Skin is warm and dry.     Capillary Refill: Capillary refill takes less than 2 seconds.  Neurological:     Mental Status: She is alert.  Psychiatric:        Mood and Affect: Mood normal.     Procedures  Procedures  ED Course / MDM   Clinical Course as of 08/30/22 2104  Wed Aug 30, 2022  1659 Bedbound from SNF, here with lethargy, on Levo, briefly on Bipap, covid +  [ ]  CT, labs, wean levo [JD]    Clinical Course User Index [JD] Laurence Spates, MD   Medical Decision Making Amount and/or Complexity of Data Reviewed Labs: ordered. Radiology: ordered. ECG/medicine tests: ordered.  Risk OTC drugs. Prescription drug management. Decision regarding hospitalization.   Briefly, is a patient with history of diabetes, hypertension, fibromyalgia, GERD, CAD who presents after being found hypotensive at her facility and somnolent with recent fatigue.  Patient was placed on BiPAP due to concern for possible full hypercapnia, blood gas however is reassuring.  Labs otherwise notable for sodium 131, leukocytosis, thrombocytopenia.  She is COVID-positive.  CT chest abdomen pelvis and head are pending at  this time.  CT head, abdomen pelvis, and chest all without acute findings.  On repeat evaluation she was able to wean off of oxygen.  I suspect her symptoms are related to symptomatic COVID.  I do not see signs of bacterial infection at this time.  She has been off levo for multiple hours.  She is normotensive.  Given episode of hypotension and concern for symptomatic COVID patient discussed with hospitalist and admitted for further management.       Laurence Spates, MD 08/30/22 2105

## 2022-08-30 NOTE — ED Provider Notes (Signed)
[place] EMERGENCY DEPARTMENT AT [facility] Provider Note   CSN: [id number] Arrival date & time: 08/30/22  1257     History  Chief Complaint  Patient presents with   Weakness    [name] is a 82 y.o. female.  HPI Patient presents for her decreased activity over the past few days.  Medical history includes DM, HTN, fibromyalgia, GERD, RLS, CAD.  She currently resides in a rehab facility following her recent neck injury.  She arrives via EMS from skilled nursing facility.  On arrival on scene, EMS found patient to be hypotensive with SBP in the 80s.  This improved during transit.  She was somnolent during transit.  SpO2 dropped while sleeping.  She was placed on supplemental oxygen. Patient endorses a current headache but denies any other current areas of pain.  She does endorse recent dysuria.  She has had a cough which she states is chronic.  History per daughter: Patient seemed well over the weekend.  She seemed unwell yesterday when daughter went to visit her.  She is typically somnolent at baseline.  She has been nonambulatory for the past 7 months.  This is secondary to a progressive worsening of cervical myelopathy.  She does have some memory loss.    Home Medications Prior to Admission medications   Medication Sig Start Date End Date Taking? Authorizing Provider  acetaminophen (TYLENOL) 325 MG tablet Take 325 mg by mouth every 6 (six) hours as needed.    [provider]  aspirin EC 81 MG tablet Take 81 mg by mouth at bedtime.     [provider]  docusate sodium (COLACE) 100 MG capsule Take 100 mg by mouth daily.    [provider]  DULoxetine (CYMBALTA) 60 MG capsule Take 1 capsule by mouth daily. 04/22/21   [provider]  ezetimibe (ZETIA) 10 MG tablet TAKE 1 TABLET BY MOUTH EVERY DAY 11/05/20   [place], [name], MD  insulin aspart (NOVOLOG FLEXPEN) 100 UNIT/ML FlexPen Max daily 75 units per correction scale Patient taking  differently: Inject 44 Units into the skin daily. Max daily 75 units per correction scale 12/02/20   Shamleffer, Konrad Dolores, MD  insulin glargine (LANTUS SOLOSTAR) 100 UNIT/ML Solostar Pen Inject 26 Units into the skin daily. Patient taking differently: Inject 10 Units into the skin daily. 12/02/20   Shamleffer, Konrad Dolores, MD  Insulin Pen Needle (BD PEN NEEDLE MICRO U/F) 32G X 6 MM MISC Inject 1 Device into the skin QID. USE 4 TIMES A DAY NOVOLOG(3) AND LANTUS9(1) 12/02/20   Shamleffer, Konrad Dolores, MD  LIDOCAINE PAIN RELIEF 4 % Place 1 patch onto the skin daily. 03/18/21   [provider]  LINZESS 145 MCG CAPS capsule TAKE 1 CAPSULE BY MOUTH DAILY BEFORE BREAKFAST. 03/22/21   [place], [name], MD  meloxicam (MOBIC) 15 MG tablet TAKE 1 TABLET (15 MG TOTAL) BY MOUTH DAILY. 02/23/21   Raulkar, Drema Pry, MD  metoprolol succinate (TOPROL-XL) 25 MG 24 hr tablet TAKE 1 TABLET BY MOUTH DAILY. PLEASE MAKE OVERDUE APPT WITH DR. Anne Fu BEFORE ANYMORE REFILLS. Patient taking differently: Take 25 mg by mouth daily. 12/13/20   Jake Bathe, MD  MYRBETRIQ 50 MG TB24 tablet TAKE 1 TABLET BY MOUTH EVERY DAY Patient taking differently: Take 50 mg by mouth daily. 09/06/20   [place], [name], MD  PFIZER-BIONT COVID-19 VAC-TRIS SUSP injection  08/05/20   [provider]  polyethylene glycol (MIRALAX / GLYCOLAX) 17 g packet  Take 17 g by mouth daily as needed for mild constipation.    [provider]  pregabalin (LYRICA) 75 MG capsule TAKE 1 CAPSULE BY MOUTH 3 TIMES DAILY. 01/21/21   Raulkar, Drema Pry, MD  RELION INSULIN SYRINGE 1ML/31G 31G X 5/16" 1 ML MISC Use as directed 01/30/17   [place], [name], MD  rosuvastatin (CRESTOR) 20 MG tablet Take 20 mg by mouth daily.    [provider]  sertraline (ZOLOFT) 50 MG tablet Take 50 mg by mouth daily. 10/04/21   [provider]  verapamil (VERELAN PM) 240 MG 24 hr capsule Take 1 capsule (240 mg total) by mouth daily. Hold if  HR is <60 08/09/21   Dyann Kief, PA-C  vitamin B-12 (CYANOCOBALAMIN) 1000 MCG tablet Take 1,000 mcg by mouth daily.    [provider]      Allergies    Amoxil [amoxicillin], Lisinopril, Chocolate, Amoxicillin-pot clavulanate, Demerol, and Other    Review of Systems   Review of Systems  Constitutional:  Positive for activity change, appetite change and fatigue.  Respiratory:  Positive for cough.   Genitourinary:  Positive for dysuria.  Neurological:  Positive for weakness (Generalized) and headaches.  All other systems reviewed and are negative.   Physical Exam Updated Vital Signs BP (!) 102/47   Pulse (!) 56   Temp 98.3 F (36.8 C) (Axillary)   Resp 13   SpO2 96%  Physical Exam Vitals and nursing note reviewed.  Constitutional:      General: She is not in acute distress.    Appearance: Normal appearance. She is well-developed. She is not toxic-appearing or diaphoretic.  HENT:     Head: Normocephalic and atraumatic.     Right Ear: External ear normal.     Left Ear: External ear normal.     Nose: Nose normal.     Mouth/Throat:     Mouth: Mucous membranes are dry.  Eyes:     Extraocular Movements: Extraocular movements intact.     Conjunctiva/sclera: Conjunctivae normal.  Cardiovascular:     Rate and Rhythm: Normal rate and regular rhythm.     Heart sounds: No murmur heard. Pulmonary:     Effort: Pulmonary effort is normal. No respiratory distress.     Breath sounds: Rhonchi present. No wheezing.  Abdominal:     General: There is no distension.     Palpations: Abdomen is soft.     Tenderness: There is no abdominal tenderness.  Musculoskeletal:        General: No swelling or deformity.     Cervical back: Normal range of motion and neck supple.     Right lower leg: No edema.     Left lower leg: No edema.  Skin:    General: Skin is warm and dry.     Coloration: Skin is not jaundiced or pale.  Neurological:     General: No focal deficit present.      Mental Status: She is alert.  Psychiatric:        Mood and Affect: Mood normal.        Behavior: Behavior normal.     ED Results / Procedures / Treatments   Labs (all labs ordered are listed, but only abnormal results are displayed) Labs Reviewed  RESP PANEL BY RT-PCR (RSV, FLU A&B, COVID)  RVPGX2 - Abnormal; Notable for the following components:      Result Value   SARS Coronavirus 2 by RT PCR POSITIVE (*)  All other components within normal limits  COMPREHENSIVE METABOLIC PANEL - Abnormal; Notable for the following components:   Sodium 131 (*)    Chloride 97 (*)    BUN 33 (*)    Calcium 8.7 (*)    Total Protein 6.0 (*)    Albumin 3.1 (*)    All other components within normal limits  CBC WITH DIFFERENTIAL/PLATELET - Abnormal; Notable for the following components:   WBC 13.6 (*)    Platelets 148 (*)    Neutro Abs 10.2 (*)    All other components within normal limits  I-STAT CHEM 8, ED - Abnormal; Notable for the following components:   Sodium 132 (*)    Chloride 96 (*)    BUN 33 (*)    All other components within normal limits  I-STAT VENOUS BLOOD GAS, ED - Abnormal; Notable for the following components:   pCO2, Ven 40.9 (*)    pO2, Ven 107 (*)    Sodium 132 (*)    All other components within normal limits  CULTURE, BLOOD (ROUTINE X 2)  CULTURE, BLOOD (ROUTINE X 2)  MAGNESIUM  TSH  BRAIN NATRIURETIC PEPTIDE  URINALYSIS, W/ REFLEX TO CULTURE (INFECTION SUSPECTED)  I-STAT CG4 LACTIC ACID, ED  CBG MONITORING, ED  I-STAT CG4 LACTIC ACID, ED  TROPONIN I (HIGH SENSITIVITY)  TROPONIN I (HIGH SENSITIVITY)    EKG EKG Interpretation Date/Time:  Wednesday August 30 2022 13:13:46 EDT Ventricular Rate:  123 PR Interval:    QRS Duration:  103 QT Interval:  329 QTC Calculation: 501 R Axis:   -34  Text Interpretation: Atrial fibrillation Paired ventricular premature complexes Inferior infarct, old Abnormal lateral Q waves Anterior infarct, old Prolonged QT interval  Confirmed by Gloris Manchester [phone]) on 08/30/2022 1:23:25 PM  Radiology DG Chest Port 1 View  Result Date: 08/30/2022 CLINICAL DATA:  Lethargy, questionable sepsis EXAM: PORTABLE CHEST 1 VIEW COMPARISON:  02/15/2021 FINDINGS: Cardiomegaly. Both lungs are clear. The visualized skeletal structures are unremarkable. IMPRESSION: Cardiomegaly without acute abnormality of the lungs in AP portable projection. Electronically Signed   By: Jearld Lesch M.D.   On: 08/30/2022 14:30    Procedures Procedures    Medications Ordered in ED Medications  acetaminophen (TYLENOL) tablet 650 mg (has no administration in time range)  lactated ringers bolus 500 mL (0 mLs Intravenous Stopped 08/30/22 1426)    ED Course/ Medical Decision Making/ A&P Clinical Course as of 08/30/22 1714  Wed Aug 30, 2022  1659 Bedbound from SNF, here with lethargy, on Levo, briefly on Bipap, covid +  [ ]  CT, labs, wean levo [JD]    Clinical Course User Index [JD] Laurence Spates, MD                             Medical Decision Making Amount and/or Complexity of Data Reviewed Labs: ordered. Radiology: ordered. ECG/medicine tests: ordered.  Risk OTC drugs. Prescription drug management.   This patient presents to the ED for concern of generalized weakness, this involves an extensive number of treatment options, and is a complaint that carries with it a high risk of complications and morbidity.  The differential diagnosis includes infection, dehydration, polypharmacy, deconditioning, metabolic derangements   Co morbidities that complicate the patient evaluation  DM, HTN, fibromyalgia, GERD, RLS, CAD   Additional history obtained:  Additional history obtained from EMS, patient's daughter External records from outside source obtained and reviewed including EMR   Lab Tests:  I Ordered, and personally interpreted labs.  The pertinent results include: Leukocytosis is present.  Patient has normal kidney function,  normal electrolytes, and normal hemoglobin.  Troponin and lactate are normal.  Patient is found to be COVID-positive.   Imaging Studies ordered:  I ordered imaging studies including chest x-ray, CT head, CTA chest, CT of abdomen and pelvis I independently visualized and interpreted imaging which showed no acute findings on x-ray, CT imaging pending at time of signout. I agree with the radiologist interpretation   Cardiac Monitoring: / EKG:  The patient was maintained on a cardiac monitor.  I personally viewed and interpreted the cardiac monitored which showed an underlying rhythm of: Atrial fibrillation  Problem List / ED Course / Critical interventions / Medication management  Patient presents for decreased activity over the past several days.  On arrival, patient endorses headache.  She has a wet cough present on exam.  She has rhonchorous lung sounds on auscultation.  She has had recent dysuria.  Oral mucosa is dry.  Patient is alert with no focal neurologic deficits.  EMS noted hypotension on scene.  This may be, in part, secondary to polypharmacy.  Although patient reports normal p.o. intake, staff at nursing facility reported decreased p.o. intake.  IV fluids were ordered.  Tylenol was ordered for headache.  Broad workup was initiated.  Patient had persistent hypotension while in the ED and was started on Levophed.  Due to her persistent somnolence, patient was started on BiPAP for concern of hypercarbia.  Blood gas actually showed no evidence of hypercarbia.  She was taken off of BiPAP.  Patient maintained normal blood pressures on 2 mcg/min of Levophed.  This was also discontinued.  On room air, patient was able to maintain normal saturations but was placed on 2 L of supplemental oxygen for comfort although blood pressures remain soft, she was able to maintain normal blood pressure off of Levophed.  She was found to be COVID-positive which certainly explains some of her recent symptoms.  CT  imaging and urinalysis were pending at time of signout to assess for other sources of infection.  Care of patient was signed out to oncoming ED provider. I ordered medication including IV fluids for hydration; Levophed for hypotension Reevaluation of the patient after these medicines showed that the patient improved I have reviewed the patients home medicines and have made adjustments as needed   Social Determinants of Health:  Resides in nursing facility        Final Clinical Impression(s) / ED Diagnoses Final diagnoses:  Generalized weakness  COVID-19  Hypotension, unspecified hypotension type    Rx / DC Orders ED Discharge Orders     None         Gloris Manchester, MD 08/30/22 1714

## 2022-08-30 NOTE — ED Notes (Signed)
Family mem has left wants [place] to know that she is incontinent and wants her to be checked and rounded throughout the night

## 2022-08-30 NOTE — Assessment & Plan Note (Addendum)
Neck injury (C6-C7 2019 with cord injury / edema it looks like based on 2019 MRI) has left her bed bound in SNF chronically at baseline at this point.

## 2022-08-30 NOTE — Assessment & Plan Note (Signed)
Hold home BP meds given initial hypotension in ED.

## 2022-08-30 NOTE — Progress Notes (Signed)
RT set up BIPAP and placed on pt. Pt tolerating BIPAP well at this time. RT will monitor as needed.

## 2022-08-30 NOTE — Assessment & Plan Note (Addendum)
Acute encephalopathy now resolved likely due to hypotension in setting Covid-19 Hypotension seems improved after IVF Hold home BP meds Treat Covid as below

## 2022-08-30 NOTE — Assessment & Plan Note (Signed)
No PNA nor O2 requirement at this time. COVID pathway Ordering Paxlovid

## 2022-08-30 NOTE — H&P (Signed)
History and Physical    Patient: [name] WUJ:[id number] DOB: Jan 04, 1941 DOA: 08/30/2022 DOS: the patient was seen and examined on 08/30/2022 PCP: [facility]  Patient coming from: Home  Chief Complaint:  Chief Complaint  Patient presents with   Weakness   HPI: [name] is a 82 y.o. female with medical history significant of DM2, HTN, fibromyalgia.  C6-C7 spinal cord injury in 2019.  She has been nonambulatory for the past 7 months. This is secondary to a progressive worsening of cervical myelopathy.   She arrives via EMS from skilled nursing facility. On arrival on scene, EMS found patient to be hypotensive with SBP in the 80s. This improved during transit. She was somnolent during transit. SpO2 dropped while sleeping. She was placed on supplemental oxygen. Patient endorses a current headache but denies any other current areas of pain. She does endorse recent dysuria. She has had a cough which she states is chronic.  History per daughter: Patient seemed well over the weekend. She seemed unwell yesterday when daughter went to visit her.  In ED: Initially hypotensive, required levophed though this able to be titrated off after IVF.  CTA head, chest, AP unremarkable other than a little mucus in bronchus / bronchospasm.  Put on bipap initially due to concern for possible hypercapnea but this was ruled out on ABG.  Put on 2L for comfort but satting mid 90s on RA.  UA neg.  Testing positive for COVID-19.    Review of Systems: As mentioned in the history of present illness. All other systems reviewed and are negative. Past Medical History:  Diagnosis Date   Anemia    during 1st pregnancy   Anxiety    Arthritis    Cancer (HCC)    Basal - face   Complication of anesthesia    Depression    Diarrhea, functional    Dysrhythmia    Paroysmal Atrial Tachycardia   Essential hypertension    Fibromyalgia    GERD (gastroesophageal reflux disease)    H/O  acute pancreatitis    Headache    History of hiatal hernia    Hyperlipemia    Neuropathy    Osteoporosis    Panic attacks    Paralysis (HCC) 01/06/2019   hands   PAT (paroxysmal atrial tachycardia)    Pneumonia    PONV (postoperative nausea and vomiting)    PSVT (paroxysmal supraventricular tachycardia)    Reflux    Restless leg syndrome    Spinal cord compression (HCC)    Type 2 diabetes mellitus (HCC)    Type II   Vertigo    not current   Past Surgical History:  Procedure Laterality Date   ABDOMINAL HYSTERECTOMY     ANKLE SURGERY Left    x 2   ANTERIOR CERVICAL DECOMP/DISCECTOMY FUSION N/A 11/12/2017   Procedure: Cervical seven to Thoracic one  Anterior cervical decompression/discectomy/fusion with removal of old plate;  Surgeon: Barnett Abu, MD;  Location: [facility] OR;  Service: Neurosurgery;  Laterality: N/A;   BACK SURGERY     T12-L1 fusion, Anterior Cervical fusion   CARDIAC CATHETERIZATION N/A 02/16/2015   Procedure: Left Heart Cath and Coronary Angiography;  Surgeon: Tonny Bollman, MD;  Location: [facility];  Service: Cardiovascular;  Laterality: N/A;   CHOLECYSTECTOMY     COLONOSCOPY     ESOPHAGOGASTRODUODENOSCOPY  12/21/2010   Procedure: ESOPHAGOGASTRODUODENOSCOPY (EGD);  Surgeon: Freddy Jaksch, MD;  Location: [facility];  Service: Endoscopy;  Laterality: N/A;  EXCISION MORTON'S NEUROMA Right    EYE SURGERY Bilateral    Cataract   FRACTURE SURGERY     ankle x 2    KNEE SURGERY Right    x 3- arthroscopy   NECK SURGERY     POSTERIOR CERVICAL FUSION/FORAMINOTOMY N/A 01/22/2018   Procedure: Cervical six-seven Posterior Decompression with Posterior Fixation Cervical Five to Thoracic Two;  Surgeon: Barnett Abu, MD;  Location: [facility] OR;  Service: Neurosurgery;  Laterality: N/A;  Cervical 6-7 Posteror decompression with posterior fixation Cervical 5 to Thoracic 2   ROTATOR CUFF REPAIR Right    TONSILLECTOMY     TOTAL KNEE ARTHROPLASTY Left 04/10/2017    Procedure: LEFT TOTAL KNEE ARTHROPLASTY;  Surgeon: Durene Romans, MD;  Location: [facility] ORS;  Service: Orthopedics;  Laterality: Left;  70 mins   UPPER GASTROINTESTINAL ENDOSCOPY     WRIST SURGERY Left    fracture   Social History:  reports that she has quit smoking. Her smoking use included cigarettes. She has been exposed to tobacco smoke. She has never used smokeless tobacco. She reports that she does not drink alcohol and does not use drugs.  Allergies  Allergen Reactions   Amoxil [Amoxicillin] Swelling    Lip swelling   Zestril [Lisinopril] Itching and Swelling   Chocolate Hives   Augmentin [Amoxicillin-Pot Clavulanate] Swelling   Demerol [Meperidine Hcl] Nausea And Vomiting   Other Other (See Comments)    Raw food or nuts - GI pain    Family History  Problem Relation Age of Onset   Heart failure Mother    Hyperlipidemia Mother    Lung cancer Father        Lymphoma   Hyperlipidemia Maternal Grandmother    Peripheral vascular disease Maternal Grandmother     Prior to Admission medications   Medication Sig Start Date End Date Taking? Authorizing Provider  acetaminophen (TYLENOL) 325 MG tablet Take 650 mg by mouth every 8 (eight) hours.   Yes [provider]  aspirin EC 81 MG tablet Take 81 mg by mouth at bedtime.    Yes [provider]  carboxymethylcellulose (REFRESH PLUS) 0.5 % SOLN Place 1 drop into both eyes 2 (two) times daily.   Yes [provider]  chlorthalidone (HYGROTON) 25 MG tablet Take 25 mg by mouth daily.   Yes [provider]  cloNIDine (CATAPRES) 0.1 MG tablet Take 0.1 mg by mouth See admin instructions. 0.1 mg twice daily as needed for sBP > 160 or dBP > 90.   Yes [provider]  diclofenac Sodium (VOLTAREN) 1 % GEL Apply 4 g topically 2 (two) times daily. To both knees   Yes [provider]  docusate sodium (COLACE) 100 MG capsule Take 100 mg by mouth at bedtime.   Yes [provider]  DULoxetine  (CYMBALTA) 60 MG capsule Take 60 mg by mouth daily. 04/22/21  Yes [provider]  esomeprazole (NEXIUM) 40 MG capsule Take 40 mg by mouth daily at 12 noon.   Yes [provider]  ezetimibe (ZETIA) 10 MG tablet TAKE 1 TABLET BY MOUTH EVERY DAY 11/05/20  Yes [place], [name], MD  fluticasone [facility]) 50 MCG/ACT nasal spray Place 2 sprays into both nostrils at bedtime.   Yes [provider]  HYDROcodone-acetaminophen (NORCO/VICODIN) 5-325 MG tablet Take 1 tablet by mouth every 8 (eight) hours.   Yes [provider]  insulin aspart (NOVOLOG FLEXPEN) 100 UNIT/ML FlexPen Max daily 75 units per correction scale Patient taking differently: Inject  12-16 Units into the skin See admin instructions. Inject 12 units in the morning and afternoon, inject 16 units every evening. 12/02/20  Yes Shamleffer, Konrad Dolores, MD  insulin glargine (LANTUS SOLOSTAR) 100 UNIT/ML Solostar Pen Inject 26 Units into the skin daily. Patient taking differently: Inject 10 Units into the skin every evening. 12/02/20  Yes Shamleffer, Konrad Dolores, MD  ipratropium-albuterol (DUONEB) 0.5-2.5 (3) MG/3ML SOLN Take 3 mLs by nebulization every 12 (twelve) hours as needed (shortness of breath).   Yes [provider]  LIDOCAINE PAIN RELIEF 4 % Place 1 patch onto the skin daily. To right shoulder 03/18/21  Yes [provider]  meloxicam (MOBIC) 15 MG tablet TAKE 1 TABLET (15 MG TOTAL) BY MOUTH DAILY. 02/23/21  Yes Raulkar, Drema Pry, MD  metoprolol succinate (TOPROL-XL) 25 MG 24 hr tablet TAKE 1 TABLET BY MOUTH DAILY. PLEASE MAKE OVERDUE APPT WITH DR. Anne Fu BEFORE ANYMORE REFILLS. 12/13/20  Yes Jake Bathe, MD  MYRBETRIQ 50 MG TB24 tablet TAKE 1 TABLET BY MOUTH EVERY DAY 09/06/20  Yes [place], [name], MD  polyethylene glycol powder (GLYCOLAX/MIRALAX) 17 GM/SCOOP powder Take 17 g by mouth daily.   Yes [provider]  pregabalin (LYRICA) 75 MG capsule TAKE 1 CAPSULE BY MOUTH 3  TIMES DAILY. 01/21/21  Yes Raulkar, Drema Pry, MD  rosuvastatin (CRESTOR) 20 MG tablet Take 20 mg by mouth every evening.   Yes [provider]  sertraline (ZOLOFT) 25 MG tablet Take 25 mg by mouth daily.   Yes [provider]  tiZANidine (ZANAFLEX) 2 MG tablet Take 2 mg by mouth daily as needed (headaches).   Yes [provider]  verapamil (VERELAN PM) 240 MG 24 hr capsule Take 1 capsule (240 mg total) by mouth daily. Hold if HR is <60 08/09/21  Yes Dyann Kief, PA-C  Insulin Pen Needle (BD PEN NEEDLE MICRO U/F) 32G X 6 MM MISC Inject 1 Device into the skin QID. USE 4 TIMES A DAY NOVOLOG(3) AND LANTUS9(1) 12/02/20   Shamleffer, Konrad Dolores, MD  RELION INSULIN SYRINGE 1ML/31G 31G X 5/16" 1 ML MISC Use as directed 01/30/17   [place], [name], MD    Physical Exam: Vitals:   08/30/22 1900 08/30/22 1915 08/30/22 1930 08/30/22 1945  BP: (!) 144/116 136/80 (!) 150/69 (!) 141/79  Pulse: (!) 58 (!) 58 (!) 58 64  Resp:  15 17   Temp:      TempSrc:      SpO2: 99% 100% 99% 100%   Constitutional: NAD, calm, comfortable Respiratory: Rhonchi present, Normal respiratory effort. No accessory muscle use.  Cardiovascular: Regular rate and rhythm, no murmurs / rubs / gallops. No extremity edema. 2+ pedal pulses. No carotid bruits.  Abdomen: no tenderness, no masses palpated. No hepatosplenomegaly. Bowel sounds positive.  Neurologic: Generalized weakness Psychiatric: Normal judgment and insight. Alert and oriented x 3. Normal mood.   Data Reviewed:    Labs on Admission: I have personally reviewed following labs and imaging studies  CBC: Recent Labs  Lab 08/30/22 1307 08/30/22 1336 08/30/22 1344  WBC 13.6*  --   --   NEUTROABS 10.2*  --   --   HGB 12.8 13.3 13.6  HCT 39.2 39.0 40.0  MCV 85.0  --   --   PLT 148*  --   --    Basic Metabolic Panel: Recent Labs  Lab 08/30/22 1307 08/30/22 1336 08/30/22 1344  NA 131* 132* 132*  K 3.8 3.9 3.9  CL 97*  96*  --    CO2 25  --   --   GLUCOSE 96 96  --   BUN 33* 33*  --   CREATININE 0.93 1.00  --   CALCIUM 8.7*  --   --   MG 2.0  --   --    GFR: CrCl cannot be calculated (Unknown ideal weight.). Liver Function Tests: Recent Labs  Lab 08/30/22 1307  AST 23  ALT 26  ALKPHOS 67  BILITOT 0.8  PROT 6.0*  ALBUMIN 3.1*   No results for input(s): "LIPASE", "AMYLASE" in the last 168 hours. No results for input(s): "AMMONIA" in the last 168 hours. Coagulation Profile: No results for input(s): "INR", "PROTIME" in the last 168 hours. Cardiac Enzymes: No results for input(s): "CKTOTAL", "CKMB", "CKMBINDEX", "TROPONINI" in the last 168 hours. BNP (last 3 results) No results for input(s): "PROBNP" in the last 8760 hours. HbA1C: No results for input(s): "HGBA1C" in the last 72 hours. CBG: Recent Labs  Lab 08/30/22 1335 08/30/22 1734  GLUCAP 97 121*   Lipid Profile: No results for input(s): "CHOL", "HDL", "LDLCALC", "TRIG", "CHOLHDL", "LDLDIRECT" in the last 72 hours. Thyroid Function Tests: Recent Labs    08/30/22 1545  TSH 0.527   Anemia Panel: No results for input(s): "VITAMINB12", "FOLATE", "FERRITIN", "TIBC", "IRON", "RETICCTPCT" in the last 72 hours. Urine analysis:    Component Value Date/Time   COLORURINE YELLOW 08/30/2022 1618   APPEARANCEUR CLEAR 08/30/2022 1618   LABSPEC 1.014 08/30/2022 1618   PHURINE 5.0 08/30/2022 1618   GLUCOSEU NEGATIVE 08/30/2022 1618   GLUCOSEU NEGATIVE 09/03/2019 1109   HGBUR NEGATIVE 08/30/2022 1618   BILIRUBINUR NEGATIVE 08/30/2022 1618   BILIRUBINUR neg 09/05/2019 1410   KETONESUR NEGATIVE 08/30/2022 1618   PROTEINUR 100 (A) 08/30/2022 1618   UROBILINOGEN 0.2 09/05/2019 1410   UROBILINOGEN 0.2 09/03/2019 1109   NITRITE NEGATIVE 08/30/2022 1618   LEUKOCYTESUR NEGATIVE 08/30/2022 1618    Radiological Exams on Admission: CT ABDOMEN PELVIS W CONTRAST  Result Date: 08/30/2022 CLINICAL DATA:  Abdominal pain, lethargy EXAM: CT ABDOMEN AND  PELVIS WITH CONTRAST TECHNIQUE: Multidetector CT imaging of the abdomen and pelvis was performed using the standard protocol following bolus administration of intravenous contrast. RADIATION DOSE REDUCTION: This exam was performed according to the departmental dose-optimization program which includes automated exposure control, adjustment of the mA and/or kV according to patient size and/or use of iterative reconstruction technique. CONTRAST:  75mL OMNIPAQUE IOHEXOL 350 MG/ML SOLN COMPARISON:  08/10/2017 FINDINGS: Motion artifacts are seen in multiple images limiting the study. Lower chest: Small linear densities are seen in lower lung fields. Motion artifacts limit evaluation. Hepatobiliary: Surgical clips are seen in gallbladder fossa. There is slight prominence of the intrahepatic bile ducts. There is no significant dilation of distal common bile duct. Pancreas: Motion artifacts limit evaluation. No definite focal abnormalities are seen. Spleen: Unremarkable. Adrenals/Urinary Tract: There is 1.5 cm nodule in left adrenal with no significant interval change. Motion artifacts limit evaluation of density measurements. There is no hydronephrosis. Evaluation of the renal cortex is limited by motion artifacts. Vascular calcifications are seen. No definite demonstrable renal or ureteral stones are seen. Urinary bladder is not distended. There is mild diffuse wall thickening in the bladder. Stomach/Bowel: Stomach is not distended. Small bowel loops are not dilated. Appendix is not adequately visualized. In image 57 of series 3, there is a small caliber tubular structure in the pericecal region, possibly normal appendix. There is no pericecal stranding. There is no significant wall  thickening in colon. There is no pericolic stranding. Vascular/Lymphatic: Calcifications are seen in aorta and its major branches. Reproductive: Uterus is not seen. Other: There is no ascites or pneumoperitoneum. Umbilical hernia containing fat  is seen. Bilateral inguinal hernias containing fat are seen. Musculoskeletal: There is posterior surgical fusion from T10-L1 levels. Degenerative changes are noted with encroachment of neural foramina from L1-S1 levels. Spinal stenosis is noted from L2-L5 levels, more severe at L2-L3 level. IMPRESSION: There is no evidence of intestinal obstruction or pneumoperitoneum. There is no hydronephrosis. Lumbar spondylosis with spinal stenosis and encroachment of neural foramina at multiple levels. Other chronic findings as described in the body of the report. Electronically Signed   By: Ernie Avena M.D.   On: 08/30/2022 18:27   CT Angio Chest PE W and/or Wo Contrast  Result Date: 08/30/2022 CLINICAL DATA:  Lethargy, hypoxia EXAM: CT ANGIOGRAPHY CHEST WITH CONTRAST TECHNIQUE: Multidetector CT imaging of the chest was performed using the standard protocol during bolus administration of intravenous contrast. Multiplanar CT image reconstructions and MIPs were obtained to evaluate the vascular anatomy. RADIATION DOSE REDUCTION: This exam was performed according to the departmental dose-optimization program which includes automated exposure control, adjustment of the mA and/or kV according to patient size and/or use of iterative reconstruction technique. CONTRAST:  75mL OMNIPAQUE IOHEXOL 350 MG/ML SOLN COMPARISON:  Previous studies including CT done on 09/17/2019 and chest radiograph done on 08/30/2022 FINDINGS: Cardiovascular: There are no intraluminal filling defects in pulmonary artery branches. There is homogeneous enhancement in thoracic aorta. There are scattered calcifications in thoracic aorta and its major branches. Coronary artery calcifications are seen. Heart is enlarged in size. Mediastinum/Nodes: No significant lymphadenopathy is seen. Thyroid appears smaller than usual in size. Lungs/Pleura: Small amount of mucus is seen in the right lateral wall of trachea close to bifurcation. There is narrowing of  AP diameter of mainstem bronchi. There are no signs of alveolar pulmonary edema. There are small linear densities in both lower lung fields. There is no focal consolidation. There is possible minimal right pleural effusion. There is no evidence of left pleural effusion. There is no pneumothorax. Upper Abdomen: Surgical clips are seen in gallbladder fossa. Musculoskeletal: There is surgical fusion in cervical spine and thoracolumbar junction. Degenerative changes are noted with bony spurs in thoracic spine. Review of the MIP images confirms the above findings. IMPRESSION: There is no evidence of pulmonary embolism. There is no evidence of thoracic aortic dissection. Small amount of mucus is seen in the lumen of trachea close to bifurcation. There is narrowing of AP diameter mainstem bronchi suggesting possible bronchospasm. Aortic arteriosclerosis.  Coronary artery calcifications are seen. There are small linear densities in both lower lung fields suggesting scarring and subsegmental atelectasis. There is no focal pulmonary consolidation. Possible minimal right pleural effusion. Surgical fusion in cervical spine and thoracolumbar junction. Electronically Signed   By: Ernie Avena M.D.   On: 08/30/2022 18:14   CT Head Wo Contrast  Result Date: 08/30/2022 CLINICAL DATA:  Mental status change, unknown cause EXAM: CT HEAD WITHOUT CONTRAST TECHNIQUE: Contiguous axial images were obtained from the base of the skull through the vertex without intravenous contrast. RADIATION DOSE REDUCTION: This exam was performed according to the departmental dose-optimization program which includes automated exposure control, adjustment of the mA and/or kV according to patient size and/or use of iterative reconstruction technique. COMPARISON:  02/15/2021 FINDINGS: Brain: No evidence of acute infarction, hemorrhage, mass, mass effect, or midline shift. No hydrocephalus or extra-axial fluid collection. Periventricular  white matter  changes, likely the sequela of chronic small vessel ischemic disease. Vascular: No hyperdense vessel. Atherosclerotic calcifications in the intracranial carotid and vertebral arteries. Skull: Negative for fracture or focal lesion. Sinuses/Orbits: Mucous retention cyst in the right maxillary sinus. No acute finding in the orbits. Other: The mastoid air cells are well aerated. IMPRESSION: No acute intracranial process. Electronically Signed   By: Wiliam Ke M.D.   On: 08/30/2022 18:05   DG Chest Port 1 View  Result Date: 08/30/2022 CLINICAL DATA:  Lethargy, questionable sepsis EXAM: PORTABLE CHEST 1 VIEW COMPARISON:  02/15/2021 FINDINGS: Cardiomegaly. Both lungs are clear. The visualized skeletal structures are unremarkable. IMPRESSION: Cardiomegaly without acute abnormality of the lungs in AP portable projection. Electronically Signed   By: Jearld Lesch M.D.   On: 08/30/2022 14:30    EKG: Independently reviewed.    Assessment and Plan: * Acute encephalopathy Acute encephalopathy now resolved likely due to hypotension in setting Covid-19 Hypotension seems improved after IVF Hold home BP meds Treat Covid as below  COVID-19 No PNA nor O2 requirement at this time. COVID pathway Ordering Paxlovid  Cervical spondylosis with myelopathy Neck injury (C6-C7 2019 with cord injury / edema it looks like based on 2019 MRI) has left her bed bound in SNF chronically at baseline at this point.  Essential hypertension, benign Hold home BP meds given initial hypotension in ED.  DM (diabetes mellitus) type II controlled, neurological manifestation (HCC) Looks like pt takes: 10u Lantus and Novolog 12u in AM and noon, 16u at dinner. Cont lantus 10 Hold novolog TID Instead use novolog: 4u mealtimes + mod scale SSI AC/HS      Advance Care Planning:   Code Status: Full Code  Consults: None  Family Communication: No family in room  Severity of Illness: The appropriate patient status for this  patient is OBSERVATION. Observation status is judged to be reasonable and necessary in order to provide the required intensity of service to ensure the patient's safety. The patient's presenting symptoms, physical exam findings, and initial radiographic and laboratory data in the context of their medical condition is felt to place them at decreased risk for further clinical deterioration. Furthermore, it is anticipated that the patient will be medically stable for discharge from the hospital within 2 midnights of admission.   Author: Hillary Bow., DO 08/30/2022 7:54 PM  For on call review [email or web address].

## 2022-08-31 DIAGNOSIS — G9340 Encephalopathy, unspecified: Secondary | ICD-10-CM

## 2022-08-31 DIAGNOSIS — M4712 Other spondylosis with myelopathy, cervical region: Secondary | ICD-10-CM | POA: Diagnosis not present

## 2022-08-31 DIAGNOSIS — I10 Essential (primary) hypertension: Secondary | ICD-10-CM | POA: Diagnosis not present

## 2022-08-31 DIAGNOSIS — U071 COVID-19: Secondary | ICD-10-CM

## 2022-08-31 DIAGNOSIS — Z794 Long term (current) use of insulin: Secondary | ICD-10-CM

## 2022-08-31 DIAGNOSIS — E1140 Type 2 diabetes mellitus with diabetic neuropathy, unspecified: Secondary | ICD-10-CM

## 2022-08-31 LAB — CBC
HCT: 38.2 % (ref 36.0–46.0)
Hemoglobin: 12.6 g/dL (ref 12.0–15.0)
MCH: 28.2 pg (ref 26.0–34.0)
MCHC: 33 g/dL (ref 30.0–36.0)
MCV: 85.5 fL (ref 80.0–100.0)
Platelets: 131 10*3/uL — ABNORMAL LOW (ref 150–400)
RBC: 4.47 MIL/uL (ref 3.87–5.11)
RDW: 14.9 % (ref 11.5–15.5)
WBC: 9.1 10*3/uL (ref 4.0–10.5)
nRBC: 0 % (ref 0.0–0.2)

## 2022-08-31 LAB — COMPREHENSIVE METABOLIC PANEL
ALT: 25 U/L (ref 0–44)
AST: 23 U/L (ref 15–41)
Albumin: 2.9 g/dL — ABNORMAL LOW (ref 3.5–5.0)
Alkaline Phosphatase: 68 U/L (ref 38–126)
Anion gap: 10 (ref 5–15)
BUN: 30 mg/dL — ABNORMAL HIGH (ref 8–23)
CO2: 22 mmol/L (ref 22–32)
Calcium: 8.5 mg/dL — ABNORMAL LOW (ref 8.9–10.3)
Chloride: 98 mmol/L (ref 98–111)
Creatinine, Ser: 0.95 mg/dL (ref 0.44–1.00)
GFR, Estimated: >60 mL/min (ref >60)
Glucose, Bld: 103 mg/dL — ABNORMAL HIGH (ref 70–99)
Potassium: 3.8 mmol/L (ref 3.5–5.1)
Sodium: 130 mmol/L — ABNORMAL LOW (ref 135–145)
Total Bilirubin: 0.8 mg/dL (ref 0.3–1.2)
Total Protein: 5.8 g/dL — ABNORMAL LOW (ref 6.5–8.1)

## 2022-08-31 LAB — MRSA NEXT GEN BY PCR, NASAL: MRSA by PCR Next Gen: DETECTED — AB

## 2022-08-31 LAB — GLUCOSE, CAPILLARY
Glucose-Capillary: 132 mg/dL — ABNORMAL HIGH (ref 70–99)
Glucose-Capillary: 115 mg/dL — ABNORMAL HIGH (ref 70–99)
Glucose-Capillary: 118 mg/dL — ABNORMAL HIGH (ref 70–99)
Glucose-Capillary: 113 mg/dL — ABNORMAL HIGH (ref 70–99)

## 2022-08-31 LAB — BRAIN NATRIURETIC PEPTIDE: B Natriuretic Peptide: 32.2 pg/mL (ref 0.0–100.0)

## 2022-08-31 LAB — CORTISOL-AM, BLOOD: Cortisol - AM: 5.2 ug/dL — ABNORMAL LOW (ref 6.7–22.6)

## 2022-08-31 MED ORDER — CHLORHEXIDINE GLUCONATE CLOTH 2 % EX PADS
6.0000 | MEDICATED_PAD | Freq: Every day | CUTANEOUS | Status: DC
  Administered 2022-08-31 – 2022-09-02 (×3): 6 via TOPICAL

## 2022-08-31 MED ORDER — LIDOCAINE 5 % EX PTCH
1.0000 | MEDICATED_PATCH | Freq: Every day | CUTANEOUS | Status: DC
  Administered 2022-09-01 – 2022-09-02 (×2): 1 via TRANSDERMAL
  Filled 2022-08-31 (×2): qty 1

## 2022-08-31 MED ORDER — MUPIROCIN 2 % EX OINT
1.0000 | TOPICAL_OINTMENT | Freq: Two times a day (BID) | CUTANEOUS | Status: DC
  Administered 2022-08-31 – 2022-09-02 (×5): 1 via NASAL
  Filled 2022-08-31: qty 22

## 2022-08-31 NOTE — Progress Notes (Signed)
PROGRESS NOTE    [name]  GMW:[id number] DOB: [birth year] DOA: 08/30/2022 PCP: [facility]    Brief Narrative:   [name] is a 82 y.o. female with medical history significant of DM2, HTN, fibromyalgia, C6-C7 spinal cord injury in 2019, nonambulatory status for last 7 months secondary to progressive cervical myelopathy presented to the hospital from skilled nursing facility.  EMS initially noted the patient to be hypotensive with systolic blood pressure in the 80s with some somnolence and received supplemental oxygen.  In the ED, patient was hypotensive and was initiated on Levophed.Marland KitchenCTA head, chest, AP unremarkable other than a little mucus in bronchus / bronchospasm.  Patient was initially put on BiPAP and subsequently on 2L for comfort but satting mid 90s on RA.  Urine analysis was negative.  Patient tested positive for COVID-19 infection.  Patient was then admitted hospital for further evaluation and treatment.  Assessment and Plan:   Acute metabolic encephalopathy Likely secondary to hypotension and COVID illness.  Has improved and at baseline as per the patient's daughter at bedside.  Continue to monitor.   COVID-19 infection. On Paxlovid.  Chest x-ray without infiltrate.  Was on 2 L of oxygen by nasal cannula.  Continue to wean oxygen as able.  Patient's daughter stated that she occasionally needs oxygen during the night and likely has underlying apnea.   Cervical spondylosis with myelopathy C6-C7 neck injury in the past.  [facility]  Nonambulatory status.   Continue pain management with the lidocaine patch, meloxicam, Lyrica.  Essential hypertension, benign Antihypertensives on hold due to low blood pressure.  Blood pressure has started to improve.   DM (diabetes mellitus) type II controlled, continue Lantus, sliding scale insulin.  Continue to monitor blood glucose levels.  Diabetic diet  Obesity.Body mass index is 44.29 kg/m.   Would benefit from weight loss as outpatient.  Mild hyponatremia.  Sodium level of 130 today.  Check levels in AM.   DVT prophylaxis: enoxaparin (LOVENOX) injection 40 mg Start: 08/30/22 2000   Code Status:     Code Status: Full Code  Disposition: Assisted living facility when okay with the facility likely in 3 to 4 days due to COVID illness need for isolation..  Patient is otherwise medically stable for disposition. . Status is: Observation  The patient will require care spanning > 2 midnights and should be moved to inpatient because: COVID illness, encephalopathy, awaiting for transfer to ALF   Family Communication: Spoke with the patient's daughter at bedside.  Consultants:  None  Procedures:  None  Antimicrobials:  Paxlovid  Anti-infectives (From admission, onward)    Start     Dose/Rate Route Frequency Ordered Stop   08/30/22 2200  nirmatrelvir/ritonavir (PAXLOVID) 3 tablet        3 tablet Oral 2 times daily 08/30/22 1938 09/04/22 2159      Subjective:  Today, patient was seen and examined at bedside.  Patient's daughter at bedside.  She daughter states that her mental completed per baseline.  She does have baseline physical deconditioning weakness from progressive myelopathy.  Denies any nausea, vomiting, fever, chills or rigor.  Objective: Vitals:   08/31/22 0300 08/31/22 0533 08/31/22 0717 08/31/22 1105  BP:   (!) 114/49 (!) 105/46  Pulse:  64 (!) 57 (!) 58  Resp:  16 11 12   Temp:  98.7 F (37.1 C) 99 F (37.2 C) 98.1 F (36.7 C)  TempSrc:  Oral Oral Oral  SpO2:  100% 100%  Weight:  113.4 kg    Height: 5\' 3"  (1.6 m)       Intake/Output Summary (Last 24 hours) at 08/31/2022 1305 Last data filed at 08/31/2022 2440 Gross per 24 hour  Intake 240 ml  Output 350 ml  Net -110 ml   Filed Weights   08/31/22 0533  Weight: 113.4 kg    Physical Examination: Body mass index is 44.29 kg/m.  General: Obese built, not in obvious distress on nasal cannula  oxygen HENT:   No scleral pallor or icterus noted. Oral mucosa is moist.  Chest:  Clear breath sounds.  Coarse breath sounds noted CVS: S1 &S2 heard. No murmur.  Regular rate and rhythm. Abdomen: Soft, nontender, nondistended.  Bowel sounds are heard.   Extremities: No cyanosis, clubbing or edema.  Peripheral pulses are palpable. Psych: Alert, awake and Communicative normal mood CNS:  No cranial nerve deficits.  Generalized weakness noted Skin: Warm and dry.  No rashes noted.  Data Reviewed:   CBC: Recent Labs  Lab 08/30/22 1307 08/30/22 1336 08/30/22 1344 08/31/22 0224  WBC 13.6*  --   --  9.1  NEUTROABS 10.2*  --   --   --   HGB 12.8 13.3 13.6 12.6  HCT 39.2 39.0 40.0 38.2  MCV 85.0  --   --  85.5  PLT 148*  --   --  131*    Basic Metabolic Panel: Recent Labs  Lab 08/30/22 1307 08/30/22 1336 08/30/22 1344 08/31/22 0224  NA 131* 132* 132* 130*  K 3.8 3.9 3.9 3.8  CL 97* 96*  --  98  CO2 25  --   --  22  GLUCOSE 96 96  --  103*  BUN 33* 33*  --  30*  CREATININE 0.93 1.00  --  0.95  CALCIUM 8.7*  --   --  8.5*  MG 2.0  --   --   --     Liver Function Tests: Recent Labs  Lab 08/30/22 1307 08/31/22 0224  AST 23 23  ALT 26 25  ALKPHOS 67 68  BILITOT 0.8 0.8  PROT 6.0* 5.8*  ALBUMIN 3.1* 2.9*     Radiology Studies: CT ABDOMEN PELVIS W CONTRAST  Result Date: 08/30/2022 CLINICAL DATA:  Abdominal pain, lethargy EXAM: CT ABDOMEN AND PELVIS WITH CONTRAST TECHNIQUE: Multidetector CT imaging of the abdomen and pelvis was performed using the standard protocol following bolus administration of intravenous contrast. RADIATION DOSE REDUCTION: This exam was performed according to the departmental dose-optimization program which includes automated exposure control, adjustment of the mA and/or kV according to patient size and/or use of iterative reconstruction technique. CONTRAST:  75mL OMNIPAQUE IOHEXOL 350 MG/ML SOLN COMPARISON:  08/10/2017 FINDINGS: Motion artifacts are  seen in multiple images limiting the study. Lower chest: Small linear densities are seen in lower lung fields. Motion artifacts limit evaluation. Hepatobiliary: Surgical clips are seen in gallbladder fossa. There is slight prominence of the intrahepatic bile ducts. There is no significant dilation of distal common bile duct. Pancreas: Motion artifacts limit evaluation. No definite focal abnormalities are seen. Spleen: Unremarkable. Adrenals/Urinary Tract: There is 1.5 cm nodule in left adrenal with no significant interval change. Motion artifacts limit evaluation of density measurements. There is no hydronephrosis. Evaluation of the renal cortex is limited by motion artifacts. Vascular calcifications are seen. No definite demonstrable renal or ureteral stones are seen. Urinary bladder is not distended. There is mild diffuse wall thickening in the bladder. Stomach/Bowel: Stomach is  not distended. Small bowel loops are not dilated. Appendix is not adequately visualized. In image 57 of series 3, there is a small caliber tubular structure in the pericecal region, possibly normal appendix. There is no pericecal stranding. There is no significant wall thickening in colon. There is no pericolic stranding. Vascular/Lymphatic: Calcifications are seen in aorta and its major branches. Reproductive: Uterus is not seen. Other: There is no ascites or pneumoperitoneum. Umbilical hernia containing fat is seen. Bilateral inguinal hernias containing fat are seen. Musculoskeletal: There is posterior surgical fusion from T10-L1 levels. Degenerative changes are noted with encroachment of neural foramina from L1-S1 levels. Spinal stenosis is noted from L2-L5 levels, more severe at L2-L3 level. IMPRESSION: There is no evidence of intestinal obstruction or pneumoperitoneum. There is no hydronephrosis. Lumbar spondylosis with spinal stenosis and encroachment of neural foramina at multiple levels. Other chronic findings as described in the  body of the report. Electronically Signed   By: Ernie Avena M.D.   On: 08/30/2022 18:27   CT Angio Chest PE W and/or Wo Contrast  Result Date: 08/30/2022 CLINICAL DATA:  Lethargy, hypoxia EXAM: CT ANGIOGRAPHY CHEST WITH CONTRAST TECHNIQUE: Multidetector CT imaging of the chest was performed using the standard protocol during bolus administration of intravenous contrast. Multiplanar CT image reconstructions and MIPs were obtained to evaluate the vascular anatomy. RADIATION DOSE REDUCTION: This exam was performed according to the departmental dose-optimization program which includes automated exposure control, adjustment of the mA and/or kV according to patient size and/or use of iterative reconstruction technique. CONTRAST:  75mL OMNIPAQUE IOHEXOL 350 MG/ML SOLN COMPARISON:  Previous studies including CT done on 09/17/2019 and chest radiograph done on 08/30/2022 FINDINGS: Cardiovascular: There are no intraluminal filling defects in pulmonary artery branches. There is homogeneous enhancement in thoracic aorta. There are scattered calcifications in thoracic aorta and its major branches. Coronary artery calcifications are seen. Heart is enlarged in size. Mediastinum/Nodes: No significant lymphadenopathy is seen. Thyroid appears smaller than usual in size. Lungs/Pleura: Small amount of mucus is seen in the right lateral wall of trachea close to bifurcation. There is narrowing of AP diameter of mainstem bronchi. There are no signs of alveolar pulmonary edema. There are small linear densities in both lower lung fields. There is no focal consolidation. There is possible minimal right pleural effusion. There is no evidence of left pleural effusion. There is no pneumothorax. Upper Abdomen: Surgical clips are seen in gallbladder fossa. Musculoskeletal: There is surgical fusion in cervical spine and thoracolumbar junction. Degenerative changes are noted with bony spurs in thoracic spine. Review of the MIP images  confirms the above findings. IMPRESSION: There is no evidence of pulmonary embolism. There is no evidence of thoracic aortic dissection. Small amount of mucus is seen in the lumen of trachea close to bifurcation. There is narrowing of AP diameter mainstem bronchi suggesting possible bronchospasm. Aortic arteriosclerosis.  Coronary artery calcifications are seen. There are small linear densities in both lower lung fields suggesting scarring and subsegmental atelectasis. There is no focal pulmonary consolidation. Possible minimal right pleural effusion. Surgical fusion in cervical spine and thoracolumbar junction. Electronically Signed   By: Ernie Avena M.D.   On: 08/30/2022 18:14   CT Head Wo Contrast  Result Date: 08/30/2022 CLINICAL DATA:  Mental status change, unknown cause EXAM: CT HEAD WITHOUT CONTRAST TECHNIQUE: Contiguous axial images were obtained from the base of the skull through the vertex without intravenous contrast. RADIATION DOSE REDUCTION: This exam was performed according to the departmental dose-optimization program which includes  automated exposure control, adjustment of the mA and/or kV according to patient size and/or use of iterative reconstruction technique. COMPARISON:  02/15/2021 FINDINGS: Brain: No evidence of acute infarction, hemorrhage, mass, mass effect, or midline shift. No hydrocephalus or extra-axial fluid collection. Periventricular white matter changes, likely the sequela of chronic small vessel ischemic disease. Vascular: No hyperdense vessel. Atherosclerotic calcifications in the intracranial carotid and vertebral arteries. Skull: Negative for fracture or focal lesion. Sinuses/Orbits: Mucous retention cyst in the right maxillary sinus. No acute finding in the orbits. Other: The mastoid air cells are well aerated. IMPRESSION: No acute intracranial process. Electronically Signed   By: Wiliam Ke M.D.   On: 08/30/2022 18:05   DG Chest Port 1 View  Result Date:  08/30/2022 CLINICAL DATA:  Lethargy, questionable sepsis EXAM: PORTABLE CHEST 1 VIEW COMPARISON:  02/15/2021 FINDINGS: Cardiomegaly. Both lungs are clear. The visualized skeletal structures are unremarkable. IMPRESSION: Cardiomegaly without acute abnormality of the lungs in AP portable projection. Electronically Signed   By: Jearld Lesch M.D.   On: 08/30/2022 14:30      LOS: 0 days    Joycelyn Das, MD [facility] Available via Epic secure chat 7am-7pm After these hours, please refer to coverage provider listed on [email or web address] 08/31/2022, 1:05 PM

## 2022-08-31 NOTE — Care Management Obs Status (Signed)
MEDICARE OBSERVATION STATUS NOTIFICATION   Patient Details  Name: [name] MRN: [id number] Date of Birth: 1941-01-30   Medicare Observation Status Notification Given:  Yes    Harriet Masson, RN 08/31/2022, 3:31 PM

## 2022-08-31 NOTE — Hospital Course (Signed)
[name] is a 82 y.o. female with medical history significant of DM2, HTN, fibromyalgia, C6-C7 spinal cord injury in 2019, nonambulatory status for last 7 months with progressive cervical myelopathy presented to the hospital from skilled nursing facility.  EMS initially noted the patient to be hypotensive with systolic blood pressure in the 80s with some somnolence and received supplemental oxygen.  In the ED patient was hypotensive initiate Levophed.Marland KitchenCTA head, chest, AP unremarkable other than a little mucus in bronchus / bronchospasm.  Patient was initially put on BiPAP and subsequently on 2L for comfort but satting mid 90s on RA.  Urine analysis was negative.  Patient tested positive for COVID-19 infection.  Assessment and Plan:   Acute encephalopathy Likely secondary to hypotension and COVID illness.  Continue to monitor.   COVID-19 infection. On Paxlovid.  Chest x-ray without infiltrate.  Was on 2 L of oxygen by nasal cannula.   Cervical spondylosis with myelopathy C6-C7 neck injury in the past.  Now at the skilled nursing facility, nonambulatory status.  Will get PT OT evaluation.  Continue pain management with the lidocaine patch meloxicam, Lyrica.  Essential hypertension, benign Antihypertensives on hold due to low blood pressure.  Blood pressure has started to improve.   DM (diabetes mellitus) type II controlled, continue Lantus, sliding scale insulin.  Continue to monitor blood glucose levels.  Diabetic diet patient

## 2022-09-01 DIAGNOSIS — E871 Hypo-osmolality and hyponatremia: Secondary | ICD-10-CM | POA: Diagnosis not present

## 2022-09-01 DIAGNOSIS — I959 Hypotension, unspecified: Secondary | ICD-10-CM | POA: Diagnosis not present

## 2022-09-01 DIAGNOSIS — Z7189 Other specified counseling: Secondary | ICD-10-CM

## 2022-09-01 DIAGNOSIS — G2581 Restless legs syndrome: Secondary | ICD-10-CM | POA: Diagnosis not present

## 2022-09-01 DIAGNOSIS — Z66 Do not resuscitate: Secondary | ICD-10-CM | POA: Diagnosis not present

## 2022-09-01 DIAGNOSIS — R300 Dysuria: Secondary | ICD-10-CM | POA: Diagnosis not present

## 2022-09-01 DIAGNOSIS — Z981 Arthrodesis status: Secondary | ICD-10-CM | POA: Diagnosis not present

## 2022-09-01 DIAGNOSIS — D696 Thrombocytopenia, unspecified: Secondary | ICD-10-CM | POA: Diagnosis not present

## 2022-09-01 DIAGNOSIS — Z515 Encounter for palliative care: Secondary | ICD-10-CM

## 2022-09-01 DIAGNOSIS — G9340 Encephalopathy, unspecified: Secondary | ICD-10-CM | POA: Diagnosis not present

## 2022-09-01 DIAGNOSIS — Z794 Long term (current) use of insulin: Secondary | ICD-10-CM | POA: Diagnosis not present

## 2022-09-01 DIAGNOSIS — I10 Essential (primary) hypertension: Secondary | ICD-10-CM | POA: Diagnosis not present

## 2022-09-01 DIAGNOSIS — E119 Type 2 diabetes mellitus without complications: Secondary | ICD-10-CM | POA: Diagnosis not present

## 2022-09-01 DIAGNOSIS — E785 Hyperlipidemia, unspecified: Secondary | ICD-10-CM | POA: Diagnosis not present

## 2022-09-01 DIAGNOSIS — R159 Full incontinence of feces: Secondary | ICD-10-CM | POA: Diagnosis not present

## 2022-09-01 DIAGNOSIS — E1140 Type 2 diabetes mellitus with diabetic neuropathy, unspecified: Secondary | ICD-10-CM | POA: Diagnosis not present

## 2022-09-01 DIAGNOSIS — U071 COVID-19: Secondary | ICD-10-CM | POA: Diagnosis present

## 2022-09-01 DIAGNOSIS — Z6841 Body Mass Index (BMI) 40.0 and over, adult: Secondary | ICD-10-CM | POA: Diagnosis not present

## 2022-09-01 DIAGNOSIS — Z87891 Personal history of nicotine dependence: Secondary | ICD-10-CM | POA: Diagnosis not present

## 2022-09-01 DIAGNOSIS — M797 Fibromyalgia: Secondary | ICD-10-CM | POA: Diagnosis not present

## 2022-09-01 DIAGNOSIS — E669 Obesity, unspecified: Secondary | ICD-10-CM | POA: Diagnosis not present

## 2022-09-01 DIAGNOSIS — M4712 Other spondylosis with myelopathy, cervical region: Secondary | ICD-10-CM | POA: Diagnosis not present

## 2022-09-01 DIAGNOSIS — G894 Chronic pain syndrome: Secondary | ICD-10-CM | POA: Diagnosis not present

## 2022-09-01 DIAGNOSIS — N3946 Mixed incontinence: Secondary | ICD-10-CM | POA: Diagnosis not present

## 2022-09-01 DIAGNOSIS — Z96652 Presence of left artificial knee joint: Secondary | ICD-10-CM | POA: Diagnosis not present

## 2022-09-01 DIAGNOSIS — Z9049 Acquired absence of other specified parts of digestive tract: Secondary | ICD-10-CM | POA: Diagnosis not present

## 2022-09-01 DIAGNOSIS — Z7401 Bed confinement status: Secondary | ICD-10-CM | POA: Diagnosis not present

## 2022-09-01 DIAGNOSIS — R053 Chronic cough: Secondary | ICD-10-CM | POA: Diagnosis not present

## 2022-09-01 DIAGNOSIS — F32A Depression, unspecified: Secondary | ICD-10-CM | POA: Diagnosis not present

## 2022-09-01 DIAGNOSIS — R531 Weakness: Secondary | ICD-10-CM | POA: Diagnosis present

## 2022-09-01 DIAGNOSIS — R059 Cough, unspecified: Secondary | ICD-10-CM | POA: Diagnosis not present

## 2022-09-01 DIAGNOSIS — G9341 Metabolic encephalopathy: Secondary | ICD-10-CM | POA: Diagnosis not present

## 2022-09-01 DIAGNOSIS — E889 Metabolic disorder, unspecified: Secondary | ICD-10-CM | POA: Diagnosis not present

## 2022-09-01 LAB — CBC
HCT: 36.4 % (ref 36.0–46.0)
Hemoglobin: 12.3 g/dL (ref 12.0–15.0)
MCH: 28.3 pg (ref 26.0–34.0)
MCHC: 33.8 g/dL (ref 30.0–36.0)
MCV: 83.7 fL (ref 80.0–100.0)
Platelets: 124 10*3/uL — ABNORMAL LOW (ref 150–400)
RBC: 4.35 MIL/uL (ref 3.87–5.11)
RDW: 14.4 % (ref 11.5–15.5)
WBC: 8.5 10*3/uL (ref 4.0–10.5)
nRBC: 0 % (ref 0.0–0.2)

## 2022-09-01 LAB — GLUCOSE, CAPILLARY
Glucose-Capillary: 104 mg/dL — ABNORMAL HIGH (ref 70–99)
Glucose-Capillary: 175 mg/dL — ABNORMAL HIGH (ref 70–99)
Glucose-Capillary: 121 mg/dL — ABNORMAL HIGH (ref 70–99)
Glucose-Capillary: 158 mg/dL — ABNORMAL HIGH (ref 70–99)

## 2022-09-01 LAB — BASIC METABOLIC PANEL
Anion gap: 8 (ref 5–15)
BUN: 24 mg/dL — ABNORMAL HIGH (ref 8–23)
CO2: 22 mmol/L (ref 22–32)
Calcium: 8.5 mg/dL — ABNORMAL LOW (ref 8.9–10.3)
Chloride: 99 mmol/L (ref 98–111)
Creatinine, Ser: 0.65 mg/dL (ref 0.44–1.00)
GFR, Estimated: >60 mL/min (ref >60)
Glucose, Bld: 106 mg/dL — ABNORMAL HIGH (ref 70–99)
Potassium: 3.7 mmol/L (ref 3.5–5.1)
Sodium: 129 mmol/L — ABNORMAL LOW (ref 135–145)

## 2022-09-01 LAB — CULTURE, BLOOD (ROUTINE X 2): Special Requests: ADEQUATE

## 2022-09-01 LAB — HEMOGLOBIN A1C
Hgb A1c MFr Bld: 6.8 % — ABNORMAL HIGH (ref 4.8–5.6)
Mean Plasma Glucose: 148 mg/dL

## 2022-09-01 LAB — MAGNESIUM: Magnesium: 1.8 mg/dL (ref 1.7–2.4)

## 2022-09-01 MED ORDER — AMLODIPINE BESYLATE 5 MG PO TABS
ORAL_TABLET | ORAL | Status: AC
  Filled 2022-09-01: qty 1

## 2022-09-01 MED ORDER — AMLODIPINE BESYLATE 10 MG PO TABS: 10.0000 mg | ORAL_TABLET | Freq: Every day | ORAL | Status: DC

## 2022-09-01 MED ORDER — AMLODIPINE BESYLATE 5 MG PO TABS
5.0000 mg | ORAL_TABLET | Freq: Every day | ORAL | Status: DC
  Administered 2022-09-01 – 2022-09-02 (×2): 5 mg via ORAL
  Filled 2022-09-01: qty 1

## 2022-09-01 NOTE — TOC Initial Note (Addendum)
Transition of Care [facility]) - Initial/Assessment Note    Patient Details  Name: [name] MRN: [id number] Date of Birth: [birth year]  [facility]) CM/SW Contact:    Harriet Masson, RN Phone Number: 09/01/2022, 2:12 PM  Clinical Narrative:                  Spoke to patient's daughter, Vikki Ports, regarding transition needs.  Patient lives at [facility]. Vikki Ports is interested in OP palliative with [facility]. Cheri with [facility] accepted referral.  Awaiting on [facility] PTAR authorization to return to ALF.  Expected Discharge Plan: Assisted Living Barriers to Discharge: Transportation (awaiting [facility] Grist)   Patient Goals and CMS Choice Patient states their goals for this hospitalization and ongoing recovery are:: daughter wants patient to return to ALF          Expected Discharge Plan and Services                                     [facility] Agency: [facility] Date [facility] Agency Contacted: 09/01/22 Time HH Agency Contacted: [phone] Representative spoke with at [facility] Agency: Cheri  Prior Living Arrangements/Services     Patient language and need for interpreter reviewed:: Yes Do you feel safe going back to the place where you live?: Yes      Need for Family Participation in Patient Care: Yes (Comment) Care giver support system in place?: Yes (comment)   Criminal Activity/Legal Involvement Pertinent to Current Situation/Hospitalization: No - Comment as needed  Activities of Daily Living      Permission Sought/Granted         Permission granted to share info w AGENCY: OP palliative        Emotional Assessment         Alcohol / Substance Use: Not Applicable Psych Involvement: No (comment)  Admission diagnosis:  Generalized weakness [R53.1] Hypotension, unspecified hypotension type [I95.9] COVID-19 [U07.1] Patient Active Problem List   Diagnosis Date Noted   COVID-19 08/30/2022   Acute encephalopathy 08/30/2022   Pain due to onychomycosis of  toenails of both feet 08/31/2021   Type 2 diabetes mellitus with hyperglycemia, with long-term current use of insulin (HCC) 12/02/2020   Coronary artery calcification 11/23/2020   Anxiety disorder 11/19/2020   Aortic atherosclerosis (HCC) 09/20/2019   Hyperlipidemia associated with type 2 diabetes mellitus (HCC) 09/17/2019   Mild recurrent major depression (HCC) 09/17/2019   Type 2 diabetes mellitus with diabetic polyneuropathy, with long-term current use of insulin (HCC) 09/04/2019   Fatigue 09/03/2019   Dysuria 09/03/2019   Peripheral neuropathy 02/28/2019   Thoracic myelopathy 01/08/2019   Cervical spine fracture (HCC) 01/22/2018   Weakness 01/21/2018   Closed C7 fracture (HCC) 01/21/2018   Fall    Lower urinary tract infectious disease    Cervical spondylosis with myelopathy 11/12/2017   Tinnitus of both ears 10/19/2017   Rhinorrhea 09/26/2017   Chronic diarrhea 08/08/2017   S/P left TKA 04/10/2017   Unstable gait 01/12/2017   Generalized osteoarthritis of hand 01/12/2017   RLS (restless legs syndrome) 11/09/2016   GERD (gastroesophageal reflux disease) 07/13/2016   Morbid obesity (HCC) 06/01/2016   Vitamin D deficiency 06/01/2016   Vitamin B12 deficiency 03/27/2016   Chronic cough 03/16/2016   Left foot pain 03/16/2016   Degenerative drusen 07/13/2013   Type 2 diabetes mellitus without ophthalmic manifestations (HCC) 07/13/2013   DM (diabetes mellitus) type  II controlled, neurological manifestation (HCC) 05/07/2012   SVT (supraventricular tachycardia) 05/07/2012   Essential hypertension, benign 05/07/2012   Fibromyalgia 05/07/2012   PCP:  [facility] Pharmacy:   [facility] - [place], [place] - [address] [place] [address] Phone: [phone] Fax: [phone]  [facility] [place], [place] - [address] [place] [address] Phone: [phone] Fax: [phone]     Social Determinants of Health  (SDOH) Social History: SDOH Screenings   Food Insecurity: No Food Insecurity (12/06/2020)  Housing: Low Risk  (12/06/2020)  Transportation Needs: No Transportation Needs (12/06/2020)  Alcohol Screen: Low Risk  (12/06/2020)  Depression (PHQ2-9): High Risk (01/26/2021)  Financial Resource Strain: Low Risk  (12/06/2020)  Physical Activity: Insufficiently Active (12/06/2020)  Social Connections: Unknown (06/24/2021)   [facility] from [facility]  Stress: No Stress Concern Present (12/06/2020)  Recent Concern: Stress - Stress Concern Present (11/17/2020)  Tobacco Use: Medium Risk (02/15/2022)   SDOH Interventions:     Readmission Risk Interventions     No data to display

## 2022-09-01 NOTE — TOC Progression Note (Signed)
Transition of Care [facility]) - Progression Note    Patient Details  Name: [name] MRN: [id number] Date of Birth: Oct 02, 1940  Transition [facility] Care [facility] CM/SW Contact  Harriet Masson, RN Phone Number: 09/01/2022, 3:09 PM  Clinical Narrative:    Tammy with [facility] notified this RNCM that PTAR transportation has been approved. Left VM with Vikki Ports notifying her of the above.   Expected Discharge Plan: Assisted Living Barriers to Discharge: Transportation (awaiting Theodoro Grist)  Expected Discharge Plan and Services                                     [facility] Agency: [facility] Date [facility] Agency Contacted: 09/01/22 Time HH Agency Contacted: 1412 Representative spoke with at [facility] Agency: Cheri   Social Determinants of Health (SDOH) Interventions SDOH Screenings   Food Insecurity: No Food Insecurity (12/06/2020)  Housing: Low Risk  (12/06/2020)  Transportation Needs: No Transportation Needs (12/06/2020)  Alcohol Screen: Low Risk  (12/06/2020)  Depression (PHQ2-9): High Risk (01/26/2021)  Financial Resource Strain: Low Risk  (12/06/2020)  Physical Activity: Insufficiently Active (12/06/2020)  Social Connections: Unknown (06/24/2021)   Received from [facility]  Stress: No Stress Concern Present (12/06/2020)  Recent Concern: Stress - Stress Concern Present (11/17/2020)  Tobacco Use: Medium Risk (02/15/2022)    Readmission Risk Interventions     No data to display

## 2022-09-01 NOTE — Progress Notes (Signed)
PROGRESS NOTE    [name]  ZOX:[id number] DOB: [birth year] DOA: 08/30/2022 PCP: [facility]    Brief Narrative:   [name] is a 82 y.o. female with medical history significant of DM2, HTN, fibromyalgia, C6-C7 spinal cord injury in 2019, nonambulatory status for last 7 months secondary to progressive cervical myelopathy presented to the hospital from skilled nursing facility.  EMS initially noted the patient to be hypotensive with systolic blood pressure in the 80s with some somnolence and received supplemental oxygen.  In the ED, patient was hypotensive and was initiated on Levophed.Marland KitchenCTA head, chest, AP unremarkable other than a little mucus in bronchus / bronchospasm.  Patient was initially put on BiPAP and subsequently on 2L for comfort but satting mid 90s on RA.  Urine analysis was negative.  Patient tested positive for COVID-19 infection.  Patient was then admitted hospital for further evaluation and treatment.  Assessment and Plan:   Acute metabolic encephalopathy Likely secondary to hypotension and COVID illness.  Has improved and at baseline as per the patient's daughter at bedside.  Continue to monitor.    COVID-19 infection. On Paxlovid.  Chest x-ray without infiltrate.  Was on 2 L of oxygen by nasal cannula initially, has been weaned to room air.  Patient's daughter stated that she occasionally needs oxygen during the night and likely has underlying apnea.   Cervical spondylosis with myelopathy C6-C7 neck injury in the past.  Now at the assisted  living facility.,  Nonambulatory status.   Continue pain management with the lidocaine patch, meloxicam, Lyrica.  Essential hypertension, benign Antihypertensives on hold due to low blood pressure.  Blood pressure has started to improve. Will change to amlodipine from cardizem.    DM (diabetes mellitus) type II controlled, continue Lantus, sliding scale insulin.  Continue to monitor blood glucose levels.   Diabetic diet  Obesity.Body mass index is 44.29 kg/m.  Would benefit from weight loss as outpatient.  Mild hyponatremia.  Sodium level of 129 today.  Check levels in AM.   DVT prophylaxis: enoxaparin (LOVENOX) injection 40 mg Start: 08/30/22 2000   Code Status:     Code Status: DNR  Disposition: Assisted living facility when okay with the facility.  Patient is medically stable for disposition. . Status is: Observation  The patient will require care spanning > 2 midnights and should be moved to inpatient because: COVID illness,  awaiting for transfer to ALF   Family Communication: Spoke with the patient's daughter at bedside on 08/31/2020.  Spoke with the caregiver at bedside.  Consultants:  None  Procedures:  None  Antimicrobials:  Paxlovid  Anti-infectives (From admission, onward)    Start     Dose/Rate Route Frequency Ordered Stop   08/30/22 2200  nirmatrelvir/ritonavir (PAXLOVID) 3 tablet        3 tablet Oral 2 times daily 08/30/22 1938 09/04/22 2159      Subjective:  Today, patient was seen and examined at bedside.  Denies interval complaints.  Feels at her baseline.  Denies any nausea vomiting shortness of breath chest pain fever or chills.  Caregiver at bedside.  Objective: Vitals:   08/31/22 2000 08/31/22 2300 09/01/22 0300 09/01/22 0815  BP: (!) 139/59 (!) 139/49 (!) 152/87 (!) 149/72  Pulse: (!) 57 63 64 69  Resp: 17 18 17 16   Temp: 98.6 F (37 C) 98.2 F (36.8 C) 98.4 F (36.9 C) 98.7 F (37.1 C)  TempSrc: Oral Oral Oral Oral  SpO2: 100% 98% 100%  99%  Weight:      Height:        Intake/Output Summary (Last 24 hours) at 09/01/2022 1430 Last data filed at 08/31/2022 1800 Gross per 24 hour  Intake 240 ml  Output --  Net 240 ml   Filed Weights   08/31/22 0533  Weight: 113.4 kg    Physical Examination: Body mass index is 44.29 kg/m.   General: Obese built, not in obvious distress on nasal cannula oxygen HENT:   No scleral pallor or  icterus noted. Oral mucosa is moist.  Chest:  Clear breath sounds.   CVS: S1 &S2 heard. No murmur.  Regular rate and rhythm. Abdomen: Soft, nontender, nondistended.  Bowel sounds are heard.   Extremities: No cyanosis, clubbing or edema.  Peripheral pulses are palpable. Psych: Alert, awake and Communicative normal mood CNS:  No cranial nerve deficits.  Generalized weakness noted Skin: Warm and dry.  No rashes noted.  Data Reviewed:   CBC: Recent Labs  Lab 08/30/22 1307 08/30/22 1336 08/30/22 1344 08/31/22 0224 09/01/22 0434  WBC 13.6*  --   --  9.1 8.5  NEUTROABS 10.2*  --   --   --   --   HGB 12.8 13.3 13.6 12.6 12.3  HCT 39.2 39.0 40.0 38.2 36.4  MCV 85.0  --   --  85.5 83.7  PLT 148*  --   --  131* 124*    Basic Metabolic Panel: Recent Labs  Lab 08/30/22 1307 08/30/22 1336 08/30/22 1344 08/31/22 0224 09/01/22 0434  NA 131* 132* 132* 130* 129*  K 3.8 3.9 3.9 3.8 3.7  CL 97* 96*  --  98 99  CO2 25  --   --  22 22  GLUCOSE 96 96  --  103* 106*  BUN 33* 33*  --  30* 24*  CREATININE 0.93 1.00  --  0.95 0.65  CALCIUM 8.7*  --   --  8.5* 8.5*  MG 2.0  --   --   --  1.8    Liver Function Tests: Recent Labs  Lab 08/30/22 1307 08/31/22 0224  AST 23 23  ALT 26 25  ALKPHOS 67 68  BILITOT 0.8 0.8  PROT 6.0* 5.8*  ALBUMIN 3.1* 2.9*     Radiology Studies: CT ABDOMEN PELVIS W CONTRAST  Result Date: 08/30/2022 CLINICAL DATA:  Abdominal pain, lethargy EXAM: CT ABDOMEN AND PELVIS WITH CONTRAST TECHNIQUE: Multidetector CT imaging of the abdomen and pelvis was performed using the standard protocol following bolus administration of intravenous contrast. RADIATION DOSE REDUCTION: This exam was performed according to the departmental dose-optimization program which includes automated exposure control, adjustment of the mA and/or kV according to patient size and/or use of iterative reconstruction technique. CONTRAST:  75mL OMNIPAQUE IOHEXOL 350 MG/ML SOLN COMPARISON:   08/10/2017 FINDINGS: Motion artifacts are seen in multiple images limiting the study. Lower chest: Small linear densities are seen in lower lung fields. Motion artifacts limit evaluation. Hepatobiliary: Surgical clips are seen in gallbladder fossa. There is slight prominence of the intrahepatic bile ducts. There is no significant dilation of distal common bile duct. Pancreas: Motion artifacts limit evaluation. No definite focal abnormalities are seen. Spleen: Unremarkable. Adrenals/Urinary Tract: There is 1.5 cm nodule in left adrenal with no significant interval change. Motion artifacts limit evaluation of density measurements. There is no hydronephrosis. Evaluation of the renal cortex is limited by motion artifacts. Vascular calcifications are seen. No definite demonstrable renal or ureteral stones are seen. Urinary bladder is not  distended. There is mild diffuse wall thickening in the bladder. Stomach/Bowel: Stomach is not distended. Small bowel loops are not dilated. Appendix is not adequately visualized. In image 57 of series 3, there is a small caliber tubular structure in the pericecal region, possibly normal appendix. There is no pericecal stranding. There is no significant wall thickening in colon. There is no pericolic stranding. Vascular/Lymphatic: Calcifications are seen in aorta and its major branches. Reproductive: Uterus is not seen. Other: There is no ascites or pneumoperitoneum. Umbilical hernia containing fat is seen. Bilateral inguinal hernias containing fat are seen. Musculoskeletal: There is posterior surgical fusion from T10-L1 levels. Degenerative changes are noted with encroachment of neural foramina from L1-S1 levels. Spinal stenosis is noted from L2-L5 levels, more severe at L2-L3 level. IMPRESSION: There is no evidence of intestinal obstruction or pneumoperitoneum. There is no hydronephrosis. Lumbar spondylosis with spinal stenosis and encroachment of neural foramina at multiple levels.  Other chronic findings as described in the body of the report. Electronically Signed   By: Ernie Avena M.D.   On: 08/30/2022 18:27   CT Angio Chest PE W and/or Wo Contrast  Result Date: 08/30/2022 CLINICAL DATA:  Lethargy, hypoxia EXAM: CT ANGIOGRAPHY CHEST WITH CONTRAST TECHNIQUE: Multidetector CT imaging of the chest was performed using the standard protocol during bolus administration of intravenous contrast. Multiplanar CT image reconstructions and MIPs were obtained to evaluate the vascular anatomy. RADIATION DOSE REDUCTION: This exam was performed according to the departmental dose-optimization program which includes automated exposure control, adjustment of the mA and/or kV according to patient size and/or use of iterative reconstruction technique. CONTRAST:  75mL OMNIPAQUE IOHEXOL 350 MG/ML SOLN COMPARISON:  Previous studies including CT done on 09/17/2019 and chest radiograph done on 08/30/2022 FINDINGS: Cardiovascular: There are no intraluminal filling defects in pulmonary artery branches. There is homogeneous enhancement in thoracic aorta. There are scattered calcifications in thoracic aorta and its major branches. Coronary artery calcifications are seen. Heart is enlarged in size. Mediastinum/Nodes: No significant lymphadenopathy is seen. Thyroid appears smaller than usual in size. Lungs/Pleura: Small amount of mucus is seen in the right lateral wall of trachea close to bifurcation. There is narrowing of AP diameter of mainstem bronchi. There are no signs of alveolar pulmonary edema. There are small linear densities in both lower lung fields. There is no focal consolidation. There is possible minimal right pleural effusion. There is no evidence of left pleural effusion. There is no pneumothorax. Upper Abdomen: Surgical clips are seen in gallbladder fossa. Musculoskeletal: There is surgical fusion in cervical spine and thoracolumbar junction. Degenerative changes are noted with bony spurs in  thoracic spine. Review of the MIP images confirms the above findings. IMPRESSION: There is no evidence of pulmonary embolism. There is no evidence of thoracic aortic dissection. Small amount of mucus is seen in the lumen of trachea close to bifurcation. There is narrowing of AP diameter mainstem bronchi suggesting possible bronchospasm. Aortic arteriosclerosis.  Coronary artery calcifications are seen. There are small linear densities in both lower lung fields suggesting scarring and subsegmental atelectasis. There is no focal pulmonary consolidation. Possible minimal right pleural effusion. Surgical fusion in cervical spine and thoracolumbar junction. Electronically Signed   By: Ernie Avena M.D.   On: 08/30/2022 18:14   CT Head Wo Contrast  Result Date: 08/30/2022 CLINICAL DATA:  Mental status change, unknown cause EXAM: CT HEAD WITHOUT CONTRAST TECHNIQUE: Contiguous axial images were obtained from the base of the skull through the vertex without intravenous contrast. RADIATION DOSE  REDUCTION: This exam was performed according to the departmental dose-optimization program which includes automated exposure control, adjustment of the mA and/or kV according to patient size and/or use of iterative reconstruction technique. COMPARISON:  02/15/2021 FINDINGS: Brain: No evidence of acute infarction, hemorrhage, mass, mass effect, or midline shift. No hydrocephalus or extra-axial fluid collection. Periventricular white matter changes, likely the sequela of chronic small vessel ischemic disease. Vascular: No hyperdense vessel. Atherosclerotic calcifications in the intracranial carotid and vertebral arteries. Skull: Negative for fracture or focal lesion. Sinuses/Orbits: Mucous retention cyst in the right maxillary sinus. No acute finding in the orbits. Other: The mastoid air cells are well aerated. IMPRESSION: No acute intracranial process. Electronically Signed   By: Wiliam Ke M.D.   On: 08/30/2022 18:05       LOS: 0 days    Joycelyn Das, MD [facility] Available via Epic secure chat 7am-7pm After these hours, please refer to coverage provider listed on [email or web address] 09/01/2022, 2:30 PM

## 2022-09-01 NOTE — Consult Note (Signed)
Palliative Medicine Inpatient Consult Note  Consulting Provider: Joycelyn Das, MD   Reason for consult:   Palliative Care Consult Services Palliative Medicine Consult   Symptom Management Consult  Reason for Consult? Goals of care   09/01/2022  HPI:  Per intake H&P -->  [name] is a 82 y.o. female with medical history significant of DM2, HTN, fibromyalgia, C6-C7 spinal cord injury in 2019, nonambulatory status for last 7 months secondary to progressive cervical myelopathy presented to the hospital from skilled nursing facility.  EMS initially noted the patient to be hypotensive with systolic blood pressure in the 80s with some somnolence and received supplemental oxygen.  In the ED, patient was hypotensive and was initiated on Levophed.CTA head, chest, AP unremarkable other than a little mucus in bronchus / bronchospasm.    Palliative care has been asked to get involved to further assist in goals of care conversations.   Clinical Assessment/Goals of Care:  *Please note that this is a verbal dictation therefore any spelling or grammatical errors are due to the "Dragon Medical One" system interpretation.  I have reviewed medical records including EPIC notes, labs and imaging, received report from bedside RN, assessed the patient who is lying in bed sleeping soundly.     I met patients CG - [name] and called [name]'s daughter, [name] after meeting with the patient to further discuss diagnosis prognosis, GOC, EOL wishes, disposition and options.   I introduced Palliative Medicine as specialized medical care for people living with serious illness. It focuses on providing relief from the symptoms and stress of a serious illness. The goal is to improve quality of life for both the patient and the family.  Medical History Review and Understanding:  I reviewed [name]'s medical records inclusive of her history of Type 2 DM, HTN, C6-7 spinal cord injury leading to cervical myelopathy.    Social History:  [name]  [place] She is a widow. She has two daughters one who is local and an, Charity fundraiser at [employer] and one who lives in [place]. She formerly worked as a Lawyer. She is a woman of faith and practices within the Protestant denomination.   Functional and Nutritional State:  [name] lives at [facility] where she has additional support from [name]". She has an aid, [name] who has been working with her for the past two months. [name] is dependent for all bADL's though at times she is able to participate in feeding herself. She has had a fairly good appetite.   Advance Directives:  A detailed discussion was had today regarding advanced directives.  Patient does have advance directives. Patients daughter, [name] shares that they have established AD's and does plan to bring them for scanning into [name].   Code Status:  Concepts specific to code status, artifical feeding and hydration, continued IV antibiotics and rehospitalization was had.  The difference between a aggressive medical intervention path  and a palliative comfort care path for this patient at this time was had.   Encouraged patient/family to consider [name] status understanding evidenced based poor outcomes in similar hospitalized patient, as the cause of arrest is likely associated with advanced chronic/terminal illness rather than an easily reversible acute cardio-pulmonary event. I explained that [name] does not change the medical plan and it only comes into effect after a person has arrested (died).  It is a protective measure to keep [place] from harming the patient in their last moments of life. [name] was agreeable to  [name] with understanding that patient would not receive CPR, defibrillation, ACLS medications, or intubation.   Discussion:  [name] and I discussed [name]'s decline over the past few months. Her daughter shares how hard this has been to watch. She recounts  conversations with her mother who has changed over the years to being at times mean to both she and her sister. She states that she is glad the PMT has been consulted as she has been wondering what will lead to her mothers final demise. We reviewed that this would be hard to determine but talked about the most likely realities of what is to come.   [name] would at this time like to allow her mother time to be more coherent. She would like to speak to her about whether or not she would desire re-hospitalization. I shared that I had placed a MOST form at bedside to support these conversations. I added that OP Palliative support could help with this as well. [name] is interested in have this level of support once [name] is discharged.   Information and education provided.   Discussed the importance of continued conversation with family and their  medical providers regarding overall plan of care and treatment options, ensuring decisions are within the context of the patients values and GOCs.  Decision Maker: [name] (Daughter): [phone] (Mobile)   SUMMARY OF RECOMMENDATIONS   DNAR though full scope of medical care at this time  Continue current care   Patients daughter will speak with [name] about if she would want to be re-hospitalized if she declines again  Appreciate TOC arranging OP support with [facility]  Ongoing PMT support  Code Status/Advance Care Planning: DNAR/DNI   Palliative Prophylaxis:  Aspiration, Bowel Regimen, Delirium Protocol, Frequent Pain Assessment, Oral Care, Palliative Wound Care, and Turn Reposition  Additional Recommendations (Limitations, Scope, Preferences): Continue current care  Psycho-social/Spiritual:  Desire for further Chaplaincy support: Not at this time Additional Recommendations: Discussed chronic decline   Prognosis: Limited overall - had previously been identified as not a hospice candidate  Discharge Planning:  Discharge back to [facility] with Palliative Support  ROS  Oral Intake %:   I/O:   Bowel Movements:   Mobility:   Vitals:   09/01/22 0300 09/01/22 0815  BP: (!) 152/87 (!) 149/72  Pulse: 64 69  Resp: 17 16  Temp: 98.4 F (36.9 C) 98.7 F (37.1 C)  SpO2: 100% 99%    Intake/Output Summary (Last 24 hours) at 09/01/2022 1511 Last data filed at 08/31/2022 1800 Gross per 24 hour  Intake 240 ml  Output --  Net 240 ml   Last Weight  Most recent update: 08/31/2022  6:08 AM    Weight  113.4 kg (250 lb)            Gen:  Elderly Caucasian F in NAD HEENT: moist mucous membranes CV: Regular rate and rhythm PULM: On RA, breathing is even and nonlabored ABD: soft/nontender  EXT: No edema  Neuro: Sleepy  PPS: 30%   This conversation/these recommendations were discussed with patient primary care team, Dr. Tyson Babinski  Billing based on MDM: High ______________________________________________________ Lamarr Lulas Froid Palliative Medicine Team Team Cell Phone: [phone] Please utilize secure chat with additional questions, if there is no response within 30 minutes please call the above phone number  Palliative Medicine Team providers are available by phone from 7am to 7pm daily and can be reached through the team cell phone.  Should this patient require assistance outside of  these hours, please call the patient's attending physician.

## 2022-09-02 DIAGNOSIS — G9340 Encephalopathy, unspecified: Secondary | ICD-10-CM | POA: Diagnosis not present

## 2022-09-02 DIAGNOSIS — E1140 Type 2 diabetes mellitus with diabetic neuropathy, unspecified: Secondary | ICD-10-CM | POA: Diagnosis not present

## 2022-09-02 DIAGNOSIS — U071 COVID-19: Secondary | ICD-10-CM | POA: Diagnosis not present

## 2022-09-02 DIAGNOSIS — M4712 Other spondylosis with myelopathy, cervical region: Secondary | ICD-10-CM | POA: Diagnosis not present

## 2022-09-02 LAB — CULTURE, BLOOD (ROUTINE X 2)
Culture: NO GROWTH
Special Requests: ADEQUATE

## 2022-09-02 LAB — GLUCOSE, CAPILLARY
Glucose-Capillary: 127 mg/dL — ABNORMAL HIGH (ref 70–99)
Glucose-Capillary: 211 mg/dL — ABNORMAL HIGH (ref 70–99)

## 2022-09-02 LAB — BASIC METABOLIC PANEL
Anion gap: 10 (ref 5–15)
BUN: 16 mg/dL (ref 8–23)
CO2: 24 mmol/L (ref 22–32)
Calcium: 8.3 mg/dL — ABNORMAL LOW (ref 8.9–10.3)
Chloride: 92 mmol/L — ABNORMAL LOW (ref 98–111)
Creatinine, Ser: 0.7 mg/dL (ref 0.44–1.00)
GFR, Estimated: >60 mL/min (ref >60)
Glucose, Bld: 142 mg/dL — ABNORMAL HIGH (ref 70–99)
Potassium: 3.7 mmol/L (ref 3.5–5.1)
Sodium: 126 mmol/L — ABNORMAL LOW (ref 135–145)

## 2022-09-02 MED ORDER — POLYETHYLENE GLYCOL 3350 17 G PO PACK: 17.0000 g | PACK | Freq: Every day | ORAL | Status: DC | PRN

## 2022-09-02 MED ORDER — AMLODIPINE BESYLATE 5 MG PO TABS: 5.0000 mg | ORAL_TABLET | Freq: Every day | ORAL | Status: DC

## 2022-09-02 MED ORDER — NIRMATRELVIR/RITONAVIR (PAXLOVID)TABLET: 3.0000 | ORAL_TABLET | Freq: Two times a day (BID) | ORAL | Status: AC

## 2022-09-02 MED ORDER — AMLODIPINE BESYLATE 5 MG PO TABS: 5.0000 mg | ORAL_TABLET | Freq: Every day | ORAL | 2 refills | Status: AC

## 2022-09-02 NOTE — Progress Notes (Signed)
   Palliative Medicine Inpatient Follow Up Note HPI: [name] is a 82 y.o. female with medical history significant of DM2, HTN, fibromyalgia, C6-C7 spinal cord injury in 2019, nonambulatory status for last 7 months secondary to progressive cervical myelopathy presented to the hospital from skilled nursing facility.  EMS initially noted the patient to be hypotensive with systolic blood pressure in the 80s with some somnolence and received supplemental oxygen.  In the ED, patient was hypotensive and was initiated on Levophed.CTA head, chest, AP unremarkable other than a little mucus in bronchus / bronchospasm.     Palliative care has been asked to get involved to further assist in goals of care conversations.    Today's Discussion 09/02/2022  *Please note that this is a verbal dictation therefore any spelling or grammatical errors are due to the "Dragon Medical One" system interpretation.  Chart reviewed inclusive of vital signs, progress notes, laboratory results, and diagnostic images.   I met at bedside with [name] and her daughter [name]. [name] was awake and alert conducting a phone call though [name] and I were able to step aside. [name] shares that the plan remains when her mother is more alert and oriented she would like to pursue further conversations regarding her wishes and complete a MOST form at that time.   I was able to meet with [name] at bedside. Created space and opportunity for patient to explore thoughts feelings and fears regarding current medical situation. She shares that her cough and the phlegm seem more pronounced this morning. She also complains that she may start experiencing dry eyes though this is not present now.  WE talked about [name]'s prior home and the land she once owned. Offer support through therapeutic listening.  Goals at this time are for [facility] to get back to her ALF.   Questions and concerns addressed/Palliative Support Provided.    Objective Assessment: Vital Signs Vitals:   09/02/22 0558 09/02/22 0800  BP: (!) 148/95 128/69  Pulse: 69 65  Resp: 17 15  Temp: 98.6 F (37 C)   SpO2: 98% 97%   No intake or output data in the 24 hours ending 09/02/22 1213 Last Weight  Most recent update: 08/31/2022  6:08 AM    Weight  113.4 kg (250 lb)            Gen:  Elderly Caucasian F in NAD HEENT: moist mucous membranes CV: Regular rate and rhythm PULM: On RA, breathing is even and nonlabored ABD: soft/nontender  EXT: No edema  Neuro: Oriented x3  SUMMARY OF RECOMMENDATIONS   DNAR though full scope of medical care at this time   Continue current care    Patients daughter will speak with [name] about if she would want to be re-hospitalized if she declines again and complete a MOST form   Appreciate TOC arranging OP support with [facility]   Plan for discharge today to [facility] based on MDM: High ______________________________________________________________________________________ [name] Palliative Medicine Team Team Cell Phone: [phone] Please utilize secure chat with additional questions, if there is no response within 30 minutes please call the above phone number  Palliative Medicine Team providers are available by phone from 7am to 7pm daily and can be reached through the team cell phone.  Should this patient require assistance outside of these hours, please call the patient's attending physician.

## 2022-09-02 NOTE — Discharge Summary (Addendum)
Physician Discharge Summary  [name] WGN:[id number] DOB: November 22, 1940 DOA: 08/30/2022  PCP: [facility]  Admit date: 08/30/2022 Discharge date: 09/02/2022  Admitted From: Assisted living facility  Discharge disposition: Assisted living facility   Recommendations for Outpatient Follow-Up:   Follow up with your primary care provider in one week.  Check CBC, BMP, magnesium in the next visit Consider palliative care as outpatient. Continue Paxlovid for next 2 days.  Dose of amlodipine can be adjusted to 10 mg after that if needed for blood pressure.  Discharge Diagnosis:   Principal Problem:   Acute encephalopathy Active Problems:   COVID-19   DM (diabetes mellitus) type II controlled, neurological manifestation (HCC)   Essential hypertension, benign   Cervical spondylosis with myelopathy   COVID-19 virus infection   Discharge Condition: Improved.  Diet recommendation: Carbohydrate-modified.   Wound care: None.  Code status: DNR   History of Present Illness:   [name] is a 82 y.o. female with medical history significant of DM2, HTN, fibromyalgia, C6-C7 spinal cord injury in 2019, nonambulatory status for last 7 months secondary to progressive cervical myelopathy presented to the hospital from skilled nursing facility.  EMS initially noted the patient to be hypotensive with systolic blood pressure in the 80s with some somnolence and received supplemental oxygen.  In the ED, patient was hypotensive and was initiated on Levophed.Marland KitchenCTA head, chest, AP unremarkable other than a little mucus in bronchus / bronchospasm.  Patient was initially put on BiPAP and subsequently on 2L for comfort but subsequently improved. Urine analysis was negative.  Patient tested positive for COVID-19 infection.  Patient was then admitted hospital for further evaluation and treatment.    Hospital Course:   Following conditions were addressed during hospitalization as  listed below,  Acute metabolic encephalopathy Likely secondary to hypotension and COVID illness.  Has improved and at baseline.     COVID-19 infection. On Paxlovid.  Continue for 2 more days to complete 5-day course.  Chest x-ray without infiltrate.  Was on 2 L of oxygen by nasal cannula initially, has been weaned to room air.  Patient's daughter stated that she occasionally needs oxygen during the night and likely has underlying apnea.   Cervical spondylosis with myelopathy History of in nature to the point patient is almost bedbound now.  C6-C7 neck injury in the past.  Now at the assisted  living facility.,  Nonambulatory status.   Continue pain management with the lidocaine patch, meloxicam, Lyrica.   Essential hypertension, benign Cardizem has been changed to amlodipine at this time.  Amlodipine 5 mg has been started due to being on Paxlovid could go up to 10 mg as needed for blood pressure.   DM (diabetes mellitus) type II controlled,  continue Lantus, NovoLog.  Diabetic diet.  Obesity.Body mass index is 44.29 kg/m.  Would benefit from weight loss as outpatient.   Mild hyponatremia.  Sodium level of 126.  Asymptomatic and at baseline mentation.  Recommend follow-up with BMP in the next visit.  Will discontinue chlorthalidone on discharge.  Disposition.  At this time, patient is stable for disposition to assisted living facility.  Seen by palliative care during hospitalization and recommend outpatient palliative care.  Medical Consultants:   Palliative care.  Procedures:    BiPAP placement Subjective:   Today, patient was seen and examined at bedside.  Complains of mild cough without chest pain.  Patient's daughter at bedside.  Discharge Exam:   Vitals:   09/02/22 0558 09/02/22  0800  BP: (!) 148/95 128/69  Pulse: 69 65  Resp: 17 15  Temp: 98.6 F (37 C)   SpO2: 98% 97%   Vitals:   09/01/22 2100 09/01/22 2200 09/02/22 0558 09/02/22 0800  BP: 118/67 (!) 102/56 (!)  148/95 128/69  Pulse: 62 65 69 65  Resp: 17 18 17 15   Temp: 98.3 F (36.8 C) 97.8 F (36.6 C) 98.6 F (37 C)   TempSrc: Oral Oral Oral   SpO2: 99% 95% 98% 97%  Weight:      Height:       Body mass index is 44.29 kg/m.   General: Alert awake, not in obvious distress, obese, elderly female. HENT: pupils equally reacting to light,  No scleral pallor or icterus noted. Oral mucosa is moist.  Chest: Diminished breath sounds bilaterally, coarse breath sounds noted.  CVS: S1 &S2 heard. No murmur.  Regular rate and rhythm. Abdomen: Soft, nontender, nondistended.  Bowel sounds are heard.   Extremities: No cyanosis, clubbing or edema.  Peripheral pulses are palpable. Psych: Alert, awake and oriented, normal mood CNS:  No cranial nerve deficits.  Moves extremities.  Generalized weakness noted. Skin: Warm and dry.  No rashes noted.  The results of significant diagnostics from this hospitalization (including imaging, microbiology, ancillary and laboratory) are listed below for reference.     Diagnostic Studies:   CT ABDOMEN PELVIS W CONTRAST  Result Date: 08/30/2022 CLINICAL DATA:  Abdominal pain, lethargy EXAM: CT ABDOMEN AND PELVIS WITH CONTRAST TECHNIQUE: Multidetector CT imaging of the abdomen and pelvis was performed using the standard protocol following bolus administration of intravenous contrast. RADIATION DOSE REDUCTION: This exam was performed according to the departmental dose-optimization program which includes automated exposure control, adjustment of the mA and/or kV according to patient size and/or use of iterative reconstruction technique. CONTRAST:  75mL OMNIPAQUE IOHEXOL 350 MG/ML SOLN COMPARISON:  08/10/2017 FINDINGS: Motion artifacts are seen in multiple images limiting the study. Lower chest: Small linear densities are seen in lower lung fields. Motion artifacts limit evaluation. Hepatobiliary: Surgical clips are seen in gallbladder fossa. There is slight prominence of the  intrahepatic bile ducts. There is no significant dilation of distal common bile duct. Pancreas: Motion artifacts limit evaluation. No definite focal abnormalities are seen. Spleen: Unremarkable. Adrenals/Urinary Tract: There is 1.5 cm nodule in left adrenal with no significant interval change. Motion artifacts limit evaluation of density measurements. There is no hydronephrosis. Evaluation of the renal cortex is limited by motion artifacts. Vascular calcifications are seen. No definite demonstrable renal or ureteral stones are seen. Urinary bladder is not distended. There is mild diffuse wall thickening in the bladder. Stomach/Bowel: Stomach is not distended. Small bowel loops are not dilated. Appendix is not adequately visualized. In image 57 of series 3, there is a small caliber tubular structure in the pericecal region, possibly normal appendix. There is no pericecal stranding. There is no significant wall thickening in colon. There is no pericolic stranding. Vascular/Lymphatic: Calcifications are seen in aorta and its major branches. Reproductive: Uterus is not seen. Other: There is no ascites or pneumoperitoneum. Umbilical hernia containing fat is seen. Bilateral inguinal hernias containing fat are seen. Musculoskeletal: There is posterior surgical fusion from T10-L1 levels. Degenerative changes are noted with encroachment of neural foramina from L1-S1 levels. Spinal stenosis is noted from L2-L5 levels, more severe at L2-L3 level. IMPRESSION: There is no evidence of intestinal obstruction or pneumoperitoneum. There is no hydronephrosis. Lumbar spondylosis with spinal stenosis and encroachment of neural foramina at  multiple levels. Other chronic findings as described in the body of the report. Electronically Signed   By: Ernie Avena M.D.   On: 08/30/2022 18:27   CT Angio Chest PE W and/or Wo Contrast  Result Date: 08/30/2022 CLINICAL DATA:  Lethargy, hypoxia EXAM: CT ANGIOGRAPHY CHEST WITH CONTRAST  TECHNIQUE: Multidetector CT imaging of the chest was performed using the standard protocol during bolus administration of intravenous contrast. Multiplanar CT image reconstructions and MIPs were obtained to evaluate the vascular anatomy. RADIATION DOSE REDUCTION: This exam was performed according to the departmental dose-optimization program which includes automated exposure control, adjustment of the mA and/or kV according to patient size and/or use of iterative reconstruction technique. CONTRAST:  75mL OMNIPAQUE IOHEXOL 350 MG/ML SOLN COMPARISON:  Previous studies including CT done on 09/17/2019 and chest radiograph done on 08/30/2022 FINDINGS: Cardiovascular: There are no intraluminal filling defects in pulmonary artery branches. There is homogeneous enhancement in thoracic aorta. There are scattered calcifications in thoracic aorta and its major branches. Coronary artery calcifications are seen. Heart is enlarged in size. Mediastinum/Nodes: No significant lymphadenopathy is seen. Thyroid appears smaller than usual in size. Lungs/Pleura: Small amount of mucus is seen in the right lateral wall of trachea close to bifurcation. There is narrowing of AP diameter of mainstem bronchi. There are no signs of alveolar pulmonary edema. There are small linear densities in both lower lung fields. There is no focal consolidation. There is possible minimal right pleural effusion. There is no evidence of left pleural effusion. There is no pneumothorax. Upper Abdomen: Surgical clips are seen in gallbladder fossa. Musculoskeletal: There is surgical fusion in cervical spine and thoracolumbar junction. Degenerative changes are noted with bony spurs in thoracic spine. Review of the MIP images confirms the above findings. IMPRESSION: There is no evidence of pulmonary embolism. There is no evidence of thoracic aortic dissection. Small amount of mucus is seen in the lumen of trachea close to bifurcation. There is narrowing of AP  diameter mainstem bronchi suggesting possible bronchospasm. Aortic arteriosclerosis.  Coronary artery calcifications are seen. There are small linear densities in both lower lung fields suggesting scarring and subsegmental atelectasis. There is no focal pulmonary consolidation. Possible minimal right pleural effusion. Surgical fusion in cervical spine and thoracolumbar junction. Electronically Signed   By: Ernie Avena M.D.   On: 08/30/2022 18:14   CT Head Wo Contrast  Result Date: 08/30/2022 CLINICAL DATA:  Mental status change, unknown cause EXAM: CT HEAD WITHOUT CONTRAST TECHNIQUE: Contiguous axial images were obtained from the base of the skull through the vertex without intravenous contrast. RADIATION DOSE REDUCTION: This exam was performed according to the departmental dose-optimization program which includes automated exposure control, adjustment of the mA and/or kV according to patient size and/or use of iterative reconstruction technique. COMPARISON:  02/15/2021 FINDINGS: Brain: No evidence of acute infarction, hemorrhage, mass, mass effect, or midline shift. No hydrocephalus or extra-axial fluid collection. Periventricular white matter changes, likely the sequela of chronic small vessel ischemic disease. Vascular: No hyperdense vessel. Atherosclerotic calcifications in the intracranial carotid and vertebral arteries. Skull: Negative for fracture or focal lesion. Sinuses/Orbits: Mucous retention cyst in the right maxillary sinus. No acute finding in the orbits. Other: The mastoid air cells are well aerated. IMPRESSION: No acute intracranial process. Electronically Signed   By: Wiliam Ke M.D.   On: 08/30/2022 18:05   DG Chest Port 1 View  Result Date: 08/30/2022 CLINICAL DATA:  Lethargy, questionable sepsis EXAM: PORTABLE CHEST 1 VIEW COMPARISON:  02/15/2021 FINDINGS: Cardiomegaly.  Both lungs are clear. The visualized skeletal structures are unremarkable. IMPRESSION: Cardiomegaly without  acute abnormality of the lungs in AP portable projection. Electronically Signed   By: Jearld Lesch M.D.   On: 08/30/2022 14:30     Labs:   Basic Metabolic Panel: Recent Labs  Lab 08/30/22 1307 08/30/22 1336 08/30/22 1344 08/31/22 0224 09/01/22 0434 09/02/22 0019  NA 131* 132* 132* 130* 129* 126*  K 3.8 3.9 3.9 3.8 3.7 3.7  CL 97* 96*  --  98 99 92*  CO2 25  --   --  22 22 24   GLUCOSE 96 96  --  103* 106* 142*  BUN 33* 33*  --  30* 24* 16  CREATININE 0.93 1.00  --  0.95 0.65 0.70  CALCIUM 8.7*  --   --  8.5* 8.5* 8.3*  MG 2.0  --   --   --  1.8  --    GFR Estimated Creatinine Clearance: 68 mL/min (by C-G formula based on SCr of 0.7 mg/dL). Liver Function Tests: Recent Labs  Lab 08/30/22 1307 08/31/22 0224  AST 23 23  ALT 26 25  ALKPHOS 67 68  BILITOT 0.8 0.8  PROT 6.0* 5.8*  ALBUMIN 3.1* 2.9*   No results for input(s): "LIPASE", "AMYLASE" in the last 168 hours. No results for input(s): "AMMONIA" in the last 168 hours. Coagulation profile No results for input(s): "INR", "PROTIME" in the last 168 hours.  CBC: Recent Labs  Lab 08/30/22 1307 08/30/22 1336 08/30/22 1344 08/31/22 0224 09/01/22 0434  WBC 13.6*  --   --  9.1 8.5  NEUTROABS 10.2*  --   --   --   --   HGB 12.8 13.3 13.6 12.6 12.3  HCT 39.2 39.0 40.0 38.2 36.4  MCV 85.0  --   --  85.5 83.7  PLT 148*  --   --  131* 124*   Cardiac Enzymes: No results for input(s): "CKTOTAL", "CKMB", "CKMBINDEX", "TROPONINI" in the last 168 hours. BNP: Invalid input(s): "POCBNP" CBG: Recent Labs  Lab 09/01/22 0641 09/01/22 1301 09/01/22 1720 09/01/22 2202 09/02/22 0557  GLUCAP 104* 175* 121* 158* 127*   D-Dimer No results for input(s): "DDIMER" in the last 72 hours. Hgb A1c Recent Labs    08/30/22 1307  HGBA1C 6.8*   Lipid Profile No results for input(s): "CHOL", "HDL", "LDLCALC", "TRIG", "CHOLHDL", "LDLDIRECT" in the last 72 hours. Thyroid function studies Recent Labs    08/30/22 1545  TSH  0.527   Anemia work up No results for input(s): "VITAMINB12", "FOLATE", "FERRITIN", "TIBC", "IRON", "RETICCTPCT" in the last 72 hours. Microbiology Recent Results (from the past 240 hour(s))  Blood Culture (routine x 2)     Status: None (Preliminary result)   Collection Time: 08/30/22  1:29 PM   Specimen: BLOOD RIGHT FOREARM  Result Value Ref Range Status   Specimen Description BLOOD RIGHT FOREARM  Final   Special Requests   Final    BOTTLES DRAWN AEROBIC AND ANAEROBIC Blood Culture adequate volume   Culture   Final    NO GROWTH 3 DAYS Performed at [facility], [address] [place], [place] [address]    Report Status PENDING  Incomplete  Blood Culture (routine x 2)     Status: None (Preliminary result)   Collection Time: 08/30/22  1:36 PM   Specimen: BLOOD  Result Value Ref Range Status   Specimen Description BLOOD LEFT ANTECUBITAL  Final   Special Requests   Final  BOTTLES DRAWN AEROBIC AND ANAEROBIC Blood Culture adequate volume   Culture   Final    NO GROWTH 3 DAYS Performed at [facility], [address] [place], [place] [address]    Report Status PENDING  Incomplete  Resp panel by RT-PCR (RSV, Flu A&B, Covid) Anterior Nasal Swab     Status: Abnormal   Collection Time: 08/30/22  2:42 PM   Specimen: Anterior Nasal Swab  Result Value Ref Range Status   SARS Coronavirus 2 by RT PCR POSITIVE (A) NEGATIVE Final   Influenza A by PCR NEGATIVE NEGATIVE Final   Influenza B by PCR NEGATIVE NEGATIVE Final    Comment: (NOTE) The Xpert Xpress SARS-CoV-2/FLU/RSV plus assay is intended as an aid in the diagnosis of influenza from Nasopharyngeal swab specimens and should not be used as a sole basis for treatment. Nasal washings and aspirates are unacceptable for Xpert Xpress SARS-CoV-2/FLU/RSV testing.  Fact Sheet for Patients: BloggerCourse.com  Fact Sheet for Healthcare Providers: SeriousBroker.it  This test  is not yet approved or cleared by the [place] FDA and has been authorized for detection and/or diagnosis of SARS-CoV-2 by FDA under an Emergency Use Authorization (EUA). This EUA will remain in effect (meaning this test can be used) for the duration of the COVID-19 declaration under Section 564(b)(1) of the Act, 21 U.S.C. section 360bbb-3(b)(1), unless the authorization is terminated or revoked.     Resp Syncytial Virus by PCR NEGATIVE NEGATIVE Final    Comment: (NOTE) Fact Sheet for Patients: BloggerCourse.com  Fact Sheet for Healthcare Providers: SeriousBroker.it  This test is not yet approved or cleared by the [place] FDA and has been authorized for detection and/or diagnosis of SARS-CoV-2 by FDA under an Emergency Use Authorization (EUA). This EUA will remain in effect (meaning this test can be used) for the duration of the COVID-19 declaration under Section 564(b)(1) of the Act, 21 U.S.C. section 360bbb-3(b)(1), unless the authorization is terminated or revoked.  Performed at [facility], [address] [place], [place] [address]   MRSA Next Gen by PCR, Nasal     Status: Abnormal   Collection Time: 08/30/22 10:57 PM   Specimen: Nasal Mucosa; Nasal Swab  Result Value Ref Range Status   MRSA by PCR Next Gen DETECTED (A) NOT DETECTED Final    Comment: RESULTS CALLED TO, READ BACK BY AND VERIFIED WITH RN A.CHANEY ON 08/31/22 AT 0105 BY NM (NOTE) The GeneXpert MRSA Assay (FDA approved for NASAL specimens only), is one component of a comprehensive MRSA colonization surveillance program. It is not intended to diagnose MRSA infection nor to guide or monitor treatment for MRSA infections. Test performance is not FDA approved in patients less than 26 years old. Performed at [facility], [address], [place], [place] [address]      Discharge Instructions:   Discharge Instructions     Amb Referral  to Palliative Care   Complete by: As directed    Diet - low sodium heart healthy   Complete by: As directed    Diet Carb Modified   Complete by: As directed    Discharge instructions   Complete by: As directed    Follow-up with your primary care provider at the assisted living facility in 1 week.  Complete the course of Paxlovid.  Your blood pressure medication -diltiazem has been changed to amlodipine.  Seek medical attention for worsening symptoms.   Increase activity slowly   Complete by: As directed  No wound care   Complete by: As directed       Allergies as of 09/02/2022       Reactions   Amoxil [amoxicillin] Swelling   Lip swelling   Zestril [lisinopril] Itching, Swelling   Chocolate Hives   Augmentin [amoxicillin-pot Clavulanate] Swelling   Demerol [meperidine Hcl] Nausea And Vomiting   Other Other (See Comments)   Raw food or nuts - GI pain        Medication List     STOP taking these medications    chlorthalidone 25 MG tablet Commonly known as: HYGROTON   verapamil 240 MG 24 hr capsule Commonly known as: VERELAN       TAKE these medications    acetaminophen 325 MG tablet Commonly known as: TYLENOL Take 650 mg by mouth every 8 (eight) hours.   amLODipine 5 MG tablet Commonly known as: NORVASC Take 1 tablet (5 mg total) by mouth daily. Start taking on: September 04, 2022   aspirin EC 81 MG tablet Take 81 mg by mouth at bedtime.   BD Pen Needle Micro U/F 32G X 6 MM Misc Generic drug: Insulin Pen Needle Inject 1 Device into the skin QID. USE 4 TIMES A DAY NOVOLOG(3) AND LANTUS9(1)   carboxymethylcellulose 0.5 % Soln Commonly known as: REFRESH PLUS Place 1 drop into both eyes 2 (two) times daily.   cloNIDine 0.1 MG tablet Commonly known as: CATAPRES Take 0.1 mg by mouth See admin instructions. 0.1 mg twice daily as needed for sBP > 160 or dBP > 90.   diclofenac Sodium 1 % Gel Commonly known as: VOLTAREN Apply 4 g topically 2 (two) times daily.  To both knees   docusate sodium 100 MG capsule Commonly known as: COLACE Take 100 mg by mouth at bedtime.   DULoxetine 60 MG capsule Commonly known as: CYMBALTA Take 60 mg by mouth daily.   esomeprazole 40 MG capsule Commonly known as: NEXIUM Take 40 mg by mouth daily at 12 noon.   ezetimibe 10 MG tablet Commonly known as: ZETIA TAKE 1 TABLET BY MOUTH EVERY DAY   fluticasone 50 MCG/ACT nasal spray Commonly known as: FLONASE Place 2 sprays into both nostrils at bedtime.   HYDROcodone-acetaminophen 5-325 MG tablet Commonly known as: NORCO/VICODIN Take 1 tablet by mouth every 8 (eight) hours.   ipratropium-albuterol 0.5-2.5 (3) MG/3ML Soln Commonly known as: DUONEB Take 3 mLs by nebulization every 12 (twelve) hours as needed (shortness of breath).   Lantus SoloStar 100 UNIT/ML Solostar Pen Generic drug: insulin glargine Inject 26 Units into the skin daily. What changed:  how much to take when to take this   Lidocaine Pain Relief 4 % Generic drug: lidocaine Place 1 patch onto the skin daily. To right shoulder   meloxicam 15 MG tablet Commonly known as: MOBIC TAKE 1 TABLET (15 MG TOTAL) BY MOUTH DAILY.   metoprolol succinate 25 MG 24 hr tablet Commonly known as: TOPROL-XL TAKE 1 TABLET BY MOUTH DAILY. PLEASE MAKE OVERDUE APPT WITH DR. Anne Fu BEFORE ANYMORE REFILLS.   Myrbetriq 50 MG Tb24 tablet Generic drug: mirabegron ER TAKE 1 TABLET BY MOUTH EVERY DAY   nirmatrelvir/ritonavir 20 x 150 MG & 10 x 100MG  Tabs Commonly known as: PAXLOVID Take 3 tablets by mouth 2 (two) times daily for 2 days. Take nirmatrelvir (150 mg) two tablets twice daily for 5 days and ritonavir (100 mg) one tablet twice daily for 2 days.Administer 300 mg nirmatrelvir (two 150 mg tablets) with 100 mg ritonavir (  one 100 mg tablet), with all three tablets taken together twice daily for 2 days. Dispensed as dose pack   NovoLOG FlexPen 100 UNIT/ML FlexPen Generic drug: insulin aspart Max daily 75  units per correction scale What changed:  how much to take how to take this when to take this additional instructions   polyethylene glycol powder 17 GM/SCOOP powder Commonly known as: GLYCOLAX/MIRALAX Take 17 g by mouth daily.   pregabalin 75 MG capsule Commonly known as: LYRICA TAKE 1 CAPSULE BY MOUTH 3 TIMES DAILY.   RELION INSULIN SYRINGE 1ML/31G 31G X 5/16" 1 ML Misc Generic drug: Insulin Syringe-Needle U-100 Use as directed   rosuvastatin 20 MG tablet Commonly known as: CRESTOR Take 20 mg by mouth every evening.   sertraline 25 MG tablet Commonly known as: ZOLOFT Take 25 mg by mouth daily.   tiZANidine 2 MG tablet Commonly known as: ZANAFLEX Take 2 mg by mouth daily as needed (headaches).        Follow-up Information     [facility] Follow up.   Why: outpatient palliative services arranged.They will contact you to [address]le apt Contact information: [address] [place] [phone] [phone]                 Time coordinating discharge: 39 minutes  Signed:  Hollin Crewe  [facility] 09/02/2022, 10:47 AM

## 2022-09-02 NOTE — TOC Transition Note (Signed)
Transition of Care [facility]) - CM/SW Discharge Note   Patient Details  Name: [name] MRN: [id number] Date of Birth: March 28, 1940  Transition of Care [facility]) CM/SW Contact:  Patrice Paradise, LCSW Phone Number: 09/02/2022, 10:59 AM   Clinical Narrative:    Patient will DC to:?[facility] Anticipated DC date:?09/02/2022 Family notified:?[facility]:GNFA   Per MD patient ready for DC to?. RN, patient, patient's family, and facility notified of DC. Discharge Summary sent to facility. RN given number for report  [phone]   . DC packet on chart. Ambulance transport requested for patient.  CSW signing off.   Judd Lien, [place] [phone]    Final next level of care: Assisted Living Barriers to Discharge: Transportation (awaiting Theodoro Grist)   Patient Goals and CMS Choice      Discharge Placement                Patient chooses bed at:  [facility]) Patient to be transferred to facility by: [facility] Name of family member notified: Vikki Ports Patient and family notified of of transfer: 09/02/22  Discharge Plan and Services Additional resources added to the After Visit Summary for                              [facility] Agency: [facility] Date [facility] Agency Contacted: 09/01/22 Time HH Agency Contacted: 1412 Representative spoke with at [facility] Agency: Cheri  Social Determinants of Health (SDOH) Interventions SDOH Screenings   Food Insecurity: No Food Insecurity (12/06/2020)  Housing: Low Risk  (12/06/2020)  Transportation Needs: No Transportation Needs (12/06/2020)  Alcohol Screen: Low Risk  (12/06/2020)  Depression (PHQ2-9): High Risk (01/26/2021)  Financial Resource Strain: Low Risk  (12/06/2020)  Physical Activity: Insufficiently Active (12/06/2020)  Social Connections: Unknown (06/24/2021)   Received from [facility], [facility]  Stress: No Stress Concern Present (12/06/2020)  Recent Concern: Stress - Stress Concern Present (11/17/2020)  Tobacco  Use: Medium Risk (02/15/2022)     Readmission Risk Interventions   No data to display

## 2022-09-02 NOTE — Plan of Care (Signed)
  Problem: Education: Goal: Knowledge of risk factors and measures for prevention of condition will improve Outcome: Adequate for Discharge   Problem: Coping: Goal: Psychosocial and spiritual needs will be supported Outcome: Adequate for Discharge   Problem: Respiratory: Goal: Will maintain a patent airway Outcome: Adequate for Discharge Goal: Complications related to the disease process, condition or treatment will be avoided or minimized Outcome: Adequate for Discharge   Problem: Education: Goal: Ability to describe self-care measures that may prevent or decrease complications (Diabetes Survival Skills Education) will improve Outcome: Adequate for Discharge Goal: Individualized Educational Video(s) Outcome: Adequate for Discharge   Problem: Coping: Goal: Ability to adjust to condition or change in health will improve Outcome: Adequate for Discharge   Problem: Fluid Volume: Goal: Ability to maintain a balanced intake and output will improve Outcome: Adequate for Discharge   Problem: Health Behavior/Discharge Planning: Goal: Ability to identify and utilize available resources and services will improve Outcome: Adequate for Discharge Goal: Ability to manage health-related needs will improve Outcome: Adequate for Discharge   Problem: Metabolic: Goal: Ability to maintain appropriate glucose levels will improve Outcome: Adequate for Discharge   Problem: Nutritional: Goal: Maintenance of adequate nutrition will improve Outcome: Adequate for Discharge Goal: Progress toward achieving an optimal weight will improve Outcome: Adequate for Discharge   Problem: Skin Integrity: Goal: Risk for impaired skin integrity will decrease Outcome: Adequate for Discharge   Problem: Tissue Perfusion: Goal: Adequacy of tissue perfusion will improve Outcome: Adequate for Discharge   Problem: Education: Goal: Knowledge of General Education information will improve Description: Including  pain rating scale, medication(s)/side effects and non-pharmacologic comfort measures Outcome: Adequate for Discharge   Problem: Health Behavior/Discharge Planning: Goal: Ability to manage health-related needs will improve Outcome: Adequate for Discharge   Problem: Clinical Measurements: Goal: Ability to maintain clinical measurements within normal limits will improve Outcome: Adequate for Discharge Goal: Will remain free from infection Outcome: Adequate for Discharge Goal: Diagnostic test results will improve Outcome: Adequate for Discharge Goal: Respiratory complications will improve Outcome: Adequate for Discharge Goal: Cardiovascular complication will be avoided Outcome: Adequate for Discharge   Problem: Activity: Goal: Risk for activity intolerance will decrease Outcome: Adequate for Discharge   Problem: Nutrition: Goal: Adequate nutrition will be maintained Outcome: Adequate for Discharge   Problem: Coping: Goal: Level of anxiety will decrease Outcome: Adequate for Discharge   Problem: Elimination: Goal: Will not experience complications related to bowel motility Outcome: Adequate for Discharge Goal: Will not experience complications related to urinary retention Outcome: Adequate for Discharge   Problem: Pain Managment: Goal: General experience of comfort will improve Outcome: Adequate for Discharge   Problem: Safety: Goal: Ability to remain free from injury will improve Outcome: Adequate for Discharge   Problem: Skin Integrity: Goal: Risk for impaired skin integrity will decrease Outcome: Adequate for Discharge   

## 2022-09-02 NOTE — NC FL2 (Signed)
[place] MEDICAID FL2 LEVEL OF CARE FORM     IDENTIFICATION  Patient Name: [name] Birthdate: [birth year] Sex: female Admission Date (Current Location): 08/30/2022  [facility] and [place] Number:  Producer, television/film/video and Address:  [facility] [place], [address] [place], [place] [address]      Provider Number: [id number]  Attending Physician Name and Address:  Joycelyn Das, MD  Relative Name and Phone Number:  Vikki Ports [phone]    Current Level of Care: Hospital Recommended Level of Care: Skilled Nursing Facility Prior Approval Number:    Date Approved/Denied:   PASRR Number: [id number]  Discharge Plan: SNF    Current Diagnoses: Patient Active Problem List   Diagnosis Date Noted   COVID-19 virus infection 09/01/2022   COVID-19 08/30/2022   Acute encephalopathy 08/30/2022   Pain due to onychomycosis of toenails of both feet 08/31/2021   Type 2 diabetes mellitus with hyperglycemia, with long-term current use of insulin (HCC) 12/02/2020   Coronary artery calcification 11/23/2020   Anxiety disorder 11/19/2020   Aortic atherosclerosis (HCC) 09/20/2019   Hyperlipidemia associated with type 2 diabetes mellitus (HCC) 09/17/2019   Mild recurrent major depression (HCC) 09/17/2019   Type 2 diabetes mellitus with diabetic polyneuropathy, with long-term current use of insulin (HCC) 09/04/2019   Fatigue 09/03/2019   Dysuria 09/03/2019   Peripheral neuropathy 02/28/2019   Thoracic myelopathy 01/08/2019   Cervical spine fracture (HCC) 01/22/2018   Weakness 01/21/2018   Closed C7 fracture (HCC) 01/21/2018   Fall    Lower urinary tract infectious disease    Cervical spondylosis with myelopathy 11/12/2017   Tinnitus of both ears 10/19/2017   Rhinorrhea 09/26/2017   Chronic diarrhea 08/08/2017   S/P left TKA 04/10/2017   Unstable gait 01/12/2017   Generalized osteoarthritis of hand 01/12/2017   RLS (restless legs syndrome) 11/09/2016   GERD  (gastroesophageal reflux disease) 07/13/2016   Morbid obesity (HCC) 06/01/2016   Vitamin D deficiency 06/01/2016   Vitamin B12 deficiency 03/27/2016   Chronic cough 03/16/2016   Left foot pain 03/16/2016   Degenerative drusen 07/13/2013   Type 2 diabetes mellitus without ophthalmic manifestations (HCC) 07/13/2013   DM (diabetes mellitus) type II controlled, neurological manifestation (HCC) 05/07/2012   SVT (supraventricular tachycardia) 05/07/2012   Essential hypertension, benign 05/07/2012   Fibromyalgia 05/07/2012    Orientation RESPIRATION BLADDER Height & Weight     Situation, Self, Place  Normal Incontinent Weight: 250 lb (113.4 kg) Height:  5\' 3"  (160 cm)  BEHAVIORAL SYMPTOMS/MOOD NEUROLOGICAL BOWEL NUTRITION STATUS      Incontinent Diet (See dc summary)  AMBULATORY STATUS COMMUNICATION OF NEEDS Skin   Limited Assist Verbally Normal                       Personal Care Assistance Level of Assistance  Bathing, Feeding, Dressing Bathing Assistance: Limited assistance Feeding assistance: Limited assistance Dressing Assistance: Limited assistance     Functional Limitations Info  Sight, Hearing, Speech Sight Info: Impaired Hearing Info: Impaired Speech Info: Adequate    SPECIAL CARE FACTORS FREQUENCY                       Contractures Contractures Info: Not present    Additional Factors Info  Code Status, Allergies Code Status Info: DNR Allergies Info: Amoxil (Amoxicillin)  Zestril (Lisinopril)  Chocolate  Augmentin (Amoxicillin-pot Clavulanate)  Demerol (Meperidine Hcl)  Other  Current Medications (09/02/2022):  This is the current hospital active medication list Current Facility-Administered Medications  Medication Dose Route Frequency Provider Last Rate Last Admin   acetaminophen (TYLENOL) tablet 650 mg  650 mg Oral Q6H PRN Hillary Bow, DO       Or   acetaminophen (TYLENOL) suppository 650 mg  650 mg Rectal Q6H PRN Hillary Bow, DO       acetaminophen (TYLENOL) tablet 650 mg  650 mg Oral Once Gloris Manchester, MD       amLODipine (NORVASC) tablet 5 mg  5 mg Oral Daily Pokhrel, Laxman, MD   5 mg at 09/02/22 6962   Followed by   Melene Muller ON 09/05/2022] amLODipine (NORVASC) tablet 10 mg  10 mg Oral Daily Pokhrel, Laxman, MD       aspirin EC tablet 81 mg  81 mg Oral QHS Hillary Bow, DO   81 mg at 09/01/22 2145   Chlorhexidine Gluconate Cloth 2 % PADS 6 each  6 each Topical Q0600 Hillary Bow, DO   6 each at 09/02/22 0534   diclofenac Sodium (VOLTAREN) 1 % topical gel 4 g  4 g Topical BID Hillary Bow, DO   4 g at 09/02/22 0945   docusate sodium (COLACE) capsule 100 mg  100 mg Oral QHS Lyda Perone M, DO   100 mg at 09/01/22 2145   DULoxetine (CYMBALTA) DR capsule 60 mg  60 mg Oral Daily Lyda Perone M, DO   60 mg at 09/02/22 0920   enoxaparin (LOVENOX) injection 40 mg  40 mg Subcutaneous Q24H Lyda Perone M, DO   40 mg at 09/01/22 2030   ezetimibe (ZETIA) tablet 10 mg  10 mg Oral Daily Hillary Bow, DO   10 mg at 09/02/22 0920   HYDROcodone-acetaminophen (NORCO/VICODIN) 5-325 MG per tablet 1 tablet  1 tablet Oral Q8H PRN Hillary Bow, DO       insulin aspart (novoLOG) injection 0-15 Units  0-15 Units Subcutaneous TID WC Hillary Bow, DO   2 Units at 09/02/22 9528   insulin aspart (novoLOG) injection 0-5 Units  0-5 Units Subcutaneous QHS Lyda Perone M, DO       insulin aspart (novoLOG) injection 4 Units  4 Units Subcutaneous TID WC Hillary Bow, DO   4 Units at 09/02/22 0900   insulin glargine-yfgn (SEMGLEE) injection 10 Units  10 Units Subcutaneous QHS Hillary Bow, DO   10 Units at 09/01/22 2145   ipratropium-albuterol (DUONEB) 0.5-2.5 (3) MG/3ML nebulizer solution 3 mL  3 mL Nebulization Q12H PRN Hillary Bow, DO       lidocaine (LIDODERM) 5 % 1 patch  1 patch Transdermal Daily Lyda Perone M, DO   1 patch at 09/02/22 0920   meloxicam (MOBIC) tablet 15 mg  15 mg Oral Daily Hillary Bow, DO   15 mg at 09/02/22 4132   mirabegron ER (MYRBETRIQ) tablet 50 mg  50 mg Oral Daily Lyda Perone M, DO   50 mg at 09/02/22 4401   mupirocin ointment (BACTROBAN) 2 % 1 Application  1 Application Nasal BID Hillary Bow, DO   1 Application at 09/02/22 0272   nirmatrelvir/ritonavir (PAXLOVID) 3 tablet  3 tablet Oral BID Hillary Bow, DO   3 tablet at 09/02/22 0935   ondansetron (ZOFRAN) tablet 4 mg  4 mg Oral Q6H PRN Hillary Bow, DO       Or   ondansetron [facility]) injection  4 mg  4 mg Intravenous Q6H PRN Hillary Bow, DO       pantoprazole (PROTONIX) EC tablet 80 mg  80 mg Oral Q1200 Lyda Perone M, DO   80 mg at 09/01/22 1315   polyethylene glycol (MIRALAX / GLYCOLAX) packet 17 g  17 g Oral Daily PRN Pokhrel, Laxman, MD       polyvinyl alcohol (LIQUIFILM TEARS) 1.4 % ophthalmic solution 1 drop  1 drop Both Eyes PRN Hillary Bow, DO       pregabalin (LYRICA) capsule 75 mg  75 mg Oral TID Hillary Bow, DO   75 mg at 09/02/22 2130   rosuvastatin (CRESTOR) tablet 20 mg  20 mg Oral QPM Lyda Perone M, DO   20 mg at 09/01/22 1755   sertraline (ZOLOFT) tablet 25 mg  25 mg Oral Daily Lyda Perone M, DO   25 mg at 09/02/22 8657     Discharge Medications: Please see discharge summary for a list of discharge medications.  Relevant Imaging Results:  Relevant Lab Results:   Additional Information SSN: [id number]  Oletta Lamas, MSW, [facility] Transitions of Care  Clinical Social Worker I

## 2022-09-02 NOTE — Progress Notes (Signed)
Report called to [facility] nurse, [facility] followed, questions asked and answered

## 2022-09-02 NOTE — TOC Progression Note (Signed)
Transition of Care [facility]) - Progression Note    Patient Details  Name: [name] MRN: [id number] Date of Birth: 1940/08/03  Transition of Care [facility]) CM/SW Contact  Patrice Paradise, LCSW Phone Number: 09/02/2022, 10:11 AM  Clinical Narrative:     Csw was alerted for pt's pending DC. CSW followed up with [facility] and was notified that pt would need DC summary and a signed fl2 faxed to facility. CSW alerted MD of needed documents.  TOC team will continue to assist with discharge planning needs.   Expected Discharge Plan: Assisted Living Barriers to Discharge: Transportation (awaiting Theodoro Grist)  Expected Discharge Plan and Services                                     [facility] Agency: [facility] Date [facility] Agency Contacted: 09/01/22 Time HH Agency Contacted: 1412 Representative spoke with at [facility] Agency: Cheri   Social Determinants of Health (SDOH) Interventions SDOH Screenings   Food Insecurity: No Food Insecurity (12/06/2020)  Housing: Low Risk  (12/06/2020)  Transportation Needs: No Transportation Needs (12/06/2020)  Alcohol Screen: Low Risk  (12/06/2020)  Depression (PHQ2-9): High Risk (01/26/2021)  Financial Resource Strain: Low Risk  (12/06/2020)  Physical Activity: Insufficiently Active (12/06/2020)  Social Connections: Unknown (06/24/2021)   Received from [facility], [facility]  Stress: No Stress Concern Present (12/06/2020)  Recent Concern: Stress - Stress Concern Present (11/17/2020)  Tobacco Use: Medium Risk (02/15/2022)    Readmission Risk Interventions   No data to display

## 2022-09-02 NOTE — TOC Progression Note (Addendum)
Transition of Care [facility]) - Progression Note    Patient Details  Name: [name] MRN: [id number] Date of Birth: 1940-05-10  Transition of Care [facility]) CM/SW Contact  Patrice Paradise, LCSW Phone Number: 09/02/2022, 10:54 AM  Clinical Narrative:     CSW followed up in regards to pt's authorization with [facility]. CSW spoke with Eunice Blase S. at [employer] and she stated that she did not see any authorization and would start new one. CSW explained that pt is medically ready for DC. Eunice Blase stated that she would call CSW back.  11:47 am- CSW received a call back from [employer] and they approved pt's authorization. Reference # [id number]. CSW notified pt's daughter.  TOC team will continue to assist with discharge planning needs.   Expected Discharge Plan: Assisted Living Barriers to Discharge: Transportation (awaiting Theodoro Grist)  Expected Discharge Plan and Services         Expected Discharge Date: 09/02/22                           [facility] Agency: [facility] Date HH Agency Contacted: 09/01/22 Time HH Agency Contacted: 1412 Representative spoke with at [facility] Agency: Cheri   Social Determinants of Health (SDOH) Interventions SDOH Screenings   Food Insecurity: No Food Insecurity (12/06/2020)  Housing: Low Risk  (12/06/2020)  Transportation Needs: No Transportation Needs (12/06/2020)  Alcohol Screen: Low Risk  (12/06/2020)  Depression (PHQ2-9): High Risk (01/26/2021)  Financial Resource Strain: Low Risk  (12/06/2020)  Physical Activity: Insufficiently Active (12/06/2020)  Social Connections: Unknown (06/24/2021)   Received [facility], [facility]  Stress: No Stress Concern Present (12/06/2020)  Recent Concern: Stress - Stress Concern Present (11/17/2020)  Tobacco Use: Medium Risk (02/15/2022)    Readmission Risk Interventions   No data to display

## 2022-09-04 DIAGNOSIS — Z8616 Personal history of COVID-19: Secondary | ICD-10-CM | POA: Diagnosis not present

## 2022-09-04 DIAGNOSIS — R053 Chronic cough: Secondary | ICD-10-CM | POA: Diagnosis not present

## 2022-09-04 DIAGNOSIS — Z09 Encounter for follow-up examination after completed treatment for conditions other than malignant neoplasm: Secondary | ICD-10-CM | POA: Diagnosis not present

## 2022-09-04 DIAGNOSIS — I9589 Other hypotension: Secondary | ICD-10-CM | POA: Diagnosis not present

## 2022-09-04 DIAGNOSIS — J9601 Acute respiratory failure with hypoxia: Secondary | ICD-10-CM | POA: Diagnosis not present

## 2022-09-04 LAB — CULTURE, BLOOD (ROUTINE X 2): Culture: NO GROWTH

## 2022-09-07 DIAGNOSIS — M4722 Other spondylosis with radiculopathy, cervical region: Secondary | ICD-10-CM | POA: Diagnosis not present

## 2022-09-07 DIAGNOSIS — Z8616 Personal history of COVID-19: Secondary | ICD-10-CM | POA: Diagnosis not present

## 2022-09-07 DIAGNOSIS — Z9181 History of falling: Secondary | ICD-10-CM | POA: Diagnosis not present

## 2022-09-07 DIAGNOSIS — S14109S Unspecified injury at unspecified level of cervical spinal cord, sequela: Secondary | ICD-10-CM | POA: Diagnosis not present

## 2022-09-11 DIAGNOSIS — M797 Fibromyalgia: Secondary | ICD-10-CM | POA: Diagnosis not present

## 2022-09-11 DIAGNOSIS — E1165 Type 2 diabetes mellitus with hyperglycemia: Secondary | ICD-10-CM | POA: Diagnosis not present

## 2022-09-11 DIAGNOSIS — F331 Major depressive disorder, recurrent, moderate: Secondary | ICD-10-CM | POA: Diagnosis not present

## 2022-09-11 DIAGNOSIS — R54 Age-related physical debility: Secondary | ICD-10-CM | POA: Diagnosis not present

## 2022-09-11 DIAGNOSIS — E785 Hyperlipidemia, unspecified: Secondary | ICD-10-CM | POA: Diagnosis not present

## 2022-09-11 DIAGNOSIS — G894 Chronic pain syndrome: Secondary | ICD-10-CM | POA: Diagnosis not present

## 2022-09-11 DIAGNOSIS — I10 Essential (primary) hypertension: Secondary | ICD-10-CM | POA: Diagnosis not present

## 2022-09-11 DIAGNOSIS — F419 Anxiety disorder, unspecified: Secondary | ICD-10-CM | POA: Diagnosis not present

## 2022-09-21 DIAGNOSIS — Z8616 Personal history of COVID-19: Secondary | ICD-10-CM | POA: Diagnosis not present

## 2022-09-21 DIAGNOSIS — Z9181 History of falling: Secondary | ICD-10-CM | POA: Diagnosis not present

## 2022-09-21 DIAGNOSIS — M4722 Other spondylosis with radiculopathy, cervical region: Secondary | ICD-10-CM | POA: Diagnosis not present

## 2022-09-21 DIAGNOSIS — S14109A Unspecified injury at unspecified level of cervical spinal cord, initial encounter: Secondary | ICD-10-CM | POA: Diagnosis not present

## 2022-10-03 DIAGNOSIS — N3946 Mixed incontinence: Secondary | ICD-10-CM | POA: Diagnosis not present

## 2022-10-03 DIAGNOSIS — R159 Full incontinence of feces: Secondary | ICD-10-CM | POA: Diagnosis not present

## 2022-10-03 DIAGNOSIS — M797 Fibromyalgia: Secondary | ICD-10-CM | POA: Diagnosis not present

## 2022-10-03 DIAGNOSIS — G894 Chronic pain syndrome: Secondary | ICD-10-CM | POA: Diagnosis not present

## 2022-10-04 DIAGNOSIS — L304 Erythema intertrigo: Secondary | ICD-10-CM | POA: Diagnosis not present

## 2022-10-04 DIAGNOSIS — R54 Age-related physical debility: Secondary | ICD-10-CM | POA: Diagnosis not present

## 2022-10-04 DIAGNOSIS — I10 Essential (primary) hypertension: Secondary | ICD-10-CM | POA: Diagnosis not present

## 2022-10-04 DIAGNOSIS — N390 Urinary tract infection, site not specified: Secondary | ICD-10-CM | POA: Diagnosis not present

## 2022-10-04 DIAGNOSIS — E1165 Type 2 diabetes mellitus with hyperglycemia: Secondary | ICD-10-CM | POA: Diagnosis not present

## 2022-10-09 DIAGNOSIS — R1312 Dysphagia, oropharyngeal phase: Secondary | ICD-10-CM | POA: Diagnosis not present

## 2022-10-09 DIAGNOSIS — Z741 Need for assistance with personal care: Secondary | ICD-10-CM | POA: Diagnosis not present

## 2022-10-09 DIAGNOSIS — R41841 Cognitive communication deficit: Secondary | ICD-10-CM | POA: Diagnosis not present

## 2022-10-09 DIAGNOSIS — M6281 Muscle weakness (generalized): Secondary | ICD-10-CM | POA: Diagnosis not present

## 2022-10-09 DIAGNOSIS — M4712 Other spondylosis with myelopathy, cervical region: Secondary | ICD-10-CM | POA: Diagnosis not present

## 2022-10-09 DIAGNOSIS — M6259 Muscle wasting and atrophy, not elsewhere classified, multiple sites: Secondary | ICD-10-CM | POA: Diagnosis not present

## 2022-10-09 DIAGNOSIS — M797 Fibromyalgia: Secondary | ICD-10-CM | POA: Diagnosis not present

## 2022-10-12 DIAGNOSIS — E119 Type 2 diabetes mellitus without complications: Secondary | ICD-10-CM | POA: Diagnosis not present

## 2022-10-12 DIAGNOSIS — E785 Hyperlipidemia, unspecified: Secondary | ICD-10-CM | POA: Diagnosis not present

## 2022-10-12 DIAGNOSIS — I10 Essential (primary) hypertension: Secondary | ICD-10-CM | POA: Diagnosis not present

## 2022-10-12 DIAGNOSIS — D649 Anemia, unspecified: Secondary | ICD-10-CM | POA: Diagnosis not present

## 2022-10-13 DIAGNOSIS — F331 Major depressive disorder, recurrent, moderate: Secondary | ICD-10-CM | POA: Diagnosis not present

## 2022-10-13 DIAGNOSIS — F419 Anxiety disorder, unspecified: Secondary | ICD-10-CM | POA: Diagnosis not present

## 2022-10-13 DIAGNOSIS — I10 Essential (primary) hypertension: Secondary | ICD-10-CM | POA: Diagnosis not present

## 2022-10-13 DIAGNOSIS — M797 Fibromyalgia: Secondary | ICD-10-CM | POA: Diagnosis not present

## 2022-10-13 DIAGNOSIS — E1165 Type 2 diabetes mellitus with hyperglycemia: Secondary | ICD-10-CM | POA: Diagnosis not present

## 2022-10-13 DIAGNOSIS — R54 Age-related physical debility: Secondary | ICD-10-CM | POA: Diagnosis not present

## 2022-10-13 DIAGNOSIS — G894 Chronic pain syndrome: Secondary | ICD-10-CM | POA: Diagnosis not present

## 2022-10-17 DIAGNOSIS — Z7401 Bed confinement status: Secondary | ICD-10-CM | POA: Diagnosis not present

## 2022-10-17 DIAGNOSIS — R531 Weakness: Secondary | ICD-10-CM | POA: Diagnosis not present

## 2022-10-18 DIAGNOSIS — I10 Essential (primary) hypertension: Secondary | ICD-10-CM | POA: Diagnosis not present

## 2022-10-18 DIAGNOSIS — E1165 Type 2 diabetes mellitus with hyperglycemia: Secondary | ICD-10-CM | POA: Diagnosis not present

## 2022-10-18 DIAGNOSIS — K219 Gastro-esophageal reflux disease without esophagitis: Secondary | ICD-10-CM | POA: Diagnosis not present

## 2022-10-18 DIAGNOSIS — E782 Mixed hyperlipidemia: Secondary | ICD-10-CM | POA: Diagnosis not present

## 2022-10-19 DIAGNOSIS — M4712 Other spondylosis with myelopathy, cervical region: Secondary | ICD-10-CM | POA: Diagnosis not present

## 2022-10-20 DIAGNOSIS — I10 Essential (primary) hypertension: Secondary | ICD-10-CM | POA: Diagnosis not present

## 2022-10-20 DIAGNOSIS — E119 Type 2 diabetes mellitus without complications: Secondary | ICD-10-CM | POA: Diagnosis not present

## 2022-10-20 DIAGNOSIS — E1149 Type 2 diabetes mellitus with other diabetic neurological complication: Secondary | ICD-10-CM | POA: Diagnosis not present

## 2022-10-23 DIAGNOSIS — I10 Essential (primary) hypertension: Secondary | ICD-10-CM | POA: Diagnosis not present

## 2022-10-23 DIAGNOSIS — M4712 Other spondylosis with myelopathy, cervical region: Secondary | ICD-10-CM | POA: Diagnosis not present

## 2022-10-25 DIAGNOSIS — E782 Mixed hyperlipidemia: Secondary | ICD-10-CM | POA: Diagnosis not present

## 2022-10-25 DIAGNOSIS — I10 Essential (primary) hypertension: Secondary | ICD-10-CM | POA: Diagnosis not present

## 2022-10-26 DIAGNOSIS — E1149 Type 2 diabetes mellitus with other diabetic neurological complication: Secondary | ICD-10-CM | POA: Diagnosis not present

## 2022-10-30 DIAGNOSIS — M797 Fibromyalgia: Secondary | ICD-10-CM | POA: Diagnosis not present

## 2022-10-30 DIAGNOSIS — I10 Essential (primary) hypertension: Secondary | ICD-10-CM | POA: Diagnosis not present

## 2022-11-03 DIAGNOSIS — M797 Fibromyalgia: Secondary | ICD-10-CM | POA: Diagnosis not present

## 2022-11-03 DIAGNOSIS — N3946 Mixed incontinence: Secondary | ICD-10-CM | POA: Diagnosis not present

## 2022-11-03 DIAGNOSIS — G894 Chronic pain syndrome: Secondary | ICD-10-CM | POA: Diagnosis not present

## 2022-11-03 DIAGNOSIS — R159 Full incontinence of feces: Secondary | ICD-10-CM | POA: Diagnosis not present

## 2022-11-09 DIAGNOSIS — E1149 Type 2 diabetes mellitus with other diabetic neurological complication: Secondary | ICD-10-CM | POA: Diagnosis not present

## 2022-11-09 DIAGNOSIS — I10 Essential (primary) hypertension: Secondary | ICD-10-CM | POA: Diagnosis not present

## 2022-11-09 DIAGNOSIS — M4712 Other spondylosis with myelopathy, cervical region: Secondary | ICD-10-CM | POA: Diagnosis not present

## 2022-11-09 DIAGNOSIS — M797 Fibromyalgia: Secondary | ICD-10-CM | POA: Diagnosis not present

## 2022-11-10 DIAGNOSIS — R112 Nausea with vomiting, unspecified: Secondary | ICD-10-CM | POA: Diagnosis not present

## 2022-11-14 DIAGNOSIS — M25511 Pain in right shoulder: Secondary | ICD-10-CM | POA: Diagnosis not present

## 2022-11-16 DIAGNOSIS — L820 Inflamed seborrheic keratosis: Secondary | ICD-10-CM | POA: Diagnosis not present

## 2022-11-16 DIAGNOSIS — L821 Other seborrheic keratosis: Secondary | ICD-10-CM | POA: Diagnosis not present

## 2022-11-16 DIAGNOSIS — L309 Dermatitis, unspecified: Secondary | ICD-10-CM | POA: Diagnosis not present

## 2022-11-16 DIAGNOSIS — L304 Erythema intertrigo: Secondary | ICD-10-CM | POA: Diagnosis not present

## 2022-12-03 DIAGNOSIS — N3946 Mixed incontinence: Secondary | ICD-10-CM | POA: Diagnosis not present

## 2022-12-03 DIAGNOSIS — R159 Full incontinence of feces: Secondary | ICD-10-CM | POA: Diagnosis not present

## 2022-12-03 DIAGNOSIS — G894 Chronic pain syndrome: Secondary | ICD-10-CM | POA: Diagnosis not present

## 2022-12-03 DIAGNOSIS — M797 Fibromyalgia: Secondary | ICD-10-CM | POA: Diagnosis not present

## 2022-12-04 DIAGNOSIS — M4712 Other spondylosis with myelopathy, cervical region: Secondary | ICD-10-CM | POA: Diagnosis not present

## 2022-12-12 DIAGNOSIS — M797 Fibromyalgia: Secondary | ICD-10-CM | POA: Diagnosis not present

## 2022-12-12 DIAGNOSIS — I10 Essential (primary) hypertension: Secondary | ICD-10-CM | POA: Diagnosis not present

## 2022-12-12 DIAGNOSIS — E1149 Type 2 diabetes mellitus with other diabetic neurological complication: Secondary | ICD-10-CM | POA: Diagnosis not present

## 2022-12-12 DIAGNOSIS — M4712 Other spondylosis with myelopathy, cervical region: Secondary | ICD-10-CM | POA: Diagnosis not present

## 2022-12-26 DIAGNOSIS — D649 Anemia, unspecified: Secondary | ICD-10-CM | POA: Diagnosis not present

## 2022-12-29 DIAGNOSIS — M797 Fibromyalgia: Secondary | ICD-10-CM | POA: Diagnosis not present

## 2022-12-29 DIAGNOSIS — R799 Abnormal finding of blood chemistry, unspecified: Secondary | ICD-10-CM | POA: Diagnosis not present

## 2023-01-02 ENCOUNTER — Ambulatory Visit: Payer: HMO | Admitting: Podiatry

## 2023-01-03 DIAGNOSIS — G894 Chronic pain syndrome: Secondary | ICD-10-CM | POA: Diagnosis not present

## 2023-01-03 DIAGNOSIS — R159 Full incontinence of feces: Secondary | ICD-10-CM | POA: Diagnosis not present

## 2023-01-03 DIAGNOSIS — N3946 Mixed incontinence: Secondary | ICD-10-CM | POA: Diagnosis not present

## 2023-01-03 DIAGNOSIS — M797 Fibromyalgia: Secondary | ICD-10-CM | POA: Diagnosis not present

## 2023-01-09 DIAGNOSIS — M797 Fibromyalgia: Secondary | ICD-10-CM | POA: Diagnosis not present

## 2023-01-09 DIAGNOSIS — I10 Essential (primary) hypertension: Secondary | ICD-10-CM | POA: Diagnosis not present

## 2023-01-09 DIAGNOSIS — M4712 Other spondylosis with myelopathy, cervical region: Secondary | ICD-10-CM | POA: Diagnosis not present

## 2023-01-09 DIAGNOSIS — E1149 Type 2 diabetes mellitus with other diabetic neurological complication: Secondary | ICD-10-CM | POA: Diagnosis not present

## 2023-01-12 DIAGNOSIS — B3789 Other sites of candidiasis: Secondary | ICD-10-CM | POA: Diagnosis not present

## 2023-01-12 DIAGNOSIS — S50321A Blister (nonthermal) of right elbow, initial encounter: Secondary | ICD-10-CM | POA: Diagnosis not present

## 2023-01-12 DIAGNOSIS — M6281 Muscle weakness (generalized): Secondary | ICD-10-CM | POA: Diagnosis not present

## 2023-01-16 DIAGNOSIS — G894 Chronic pain syndrome: Secondary | ICD-10-CM | POA: Diagnosis not present

## 2023-01-16 DIAGNOSIS — I10 Essential (primary) hypertension: Secondary | ICD-10-CM | POA: Diagnosis not present

## 2023-01-18 ENCOUNTER — Encounter: Payer: Self-pay | Admitting: Podiatry

## 2023-01-18 ENCOUNTER — Ambulatory Visit: Payer: PPO | Admitting: Podiatry

## 2023-01-18 DIAGNOSIS — M79674 Pain in right toe(s): Secondary | ICD-10-CM

## 2023-01-18 DIAGNOSIS — M79675 Pain in left toe(s): Secondary | ICD-10-CM | POA: Diagnosis not present

## 2023-01-18 DIAGNOSIS — L03039 Cellulitis of unspecified toe: Secondary | ICD-10-CM

## 2023-01-18 DIAGNOSIS — B351 Tinea unguium: Secondary | ICD-10-CM

## 2023-01-18 NOTE — Progress Notes (Signed)
Subjective:   Patient ID: [name], female   DOB: 82 y.o.   MRN: [id number]   HPI Patient presents with several different problems with 1 being painful ingrown toenail deformity and also history of infection of the nailbeds and presents with caregiver.  Has been on antibiotics   ROS      Objective:  Physical Exam  Neurovascular status unchanged moderately diminished with patient  Irritation of the lateral border left big toe medial border right big toe with patient currently in wheelchair with swelling and incurvation of nail beds     Assessment:  Moderate paronychia infection secondary to swelling of the feet with patient's feet in dependent position along with thick incurvated nailbeds     Plan:  H&P reviewed discussed soaks and carefully debrided out the corners taking pressure off of these and a small amount of necrotic tissue.  Patient will be seen back as needed this should heal uneventfully and I discussed if any redness or drainage were to occur we will see the patient back and discussed with caregiver

## 2023-01-19 DIAGNOSIS — M797 Fibromyalgia: Secondary | ICD-10-CM | POA: Diagnosis not present

## 2023-01-19 DIAGNOSIS — K219 Gastro-esophageal reflux disease without esophagitis: Secondary | ICD-10-CM | POA: Diagnosis not present

## 2023-01-19 DIAGNOSIS — E1149 Type 2 diabetes mellitus with other diabetic neurological complication: Secondary | ICD-10-CM | POA: Diagnosis not present

## 2023-01-19 DIAGNOSIS — N3281 Overactive bladder: Secondary | ICD-10-CM | POA: Diagnosis not present

## 2023-01-19 DIAGNOSIS — I10 Essential (primary) hypertension: Secondary | ICD-10-CM | POA: Diagnosis not present

## 2023-01-19 DIAGNOSIS — F330 Major depressive disorder, recurrent, mild: Secondary | ICD-10-CM | POA: Diagnosis not present

## 2023-01-19 DIAGNOSIS — E785 Hyperlipidemia, unspecified: Secondary | ICD-10-CM | POA: Diagnosis not present

## 2023-01-19 DIAGNOSIS — M4712 Other spondylosis with myelopathy, cervical region: Secondary | ICD-10-CM | POA: Diagnosis not present

## 2023-02-02 DIAGNOSIS — R159 Full incontinence of feces: Secondary | ICD-10-CM | POA: Diagnosis not present

## 2023-02-02 DIAGNOSIS — G894 Chronic pain syndrome: Secondary | ICD-10-CM | POA: Diagnosis not present

## 2023-02-02 DIAGNOSIS — F330 Major depressive disorder, recurrent, mild: Secondary | ICD-10-CM | POA: Diagnosis not present

## 2023-02-02 DIAGNOSIS — N3946 Mixed incontinence: Secondary | ICD-10-CM | POA: Diagnosis not present

## 2023-02-02 DIAGNOSIS — M797 Fibromyalgia: Secondary | ICD-10-CM | POA: Diagnosis not present

## 2023-02-13 DIAGNOSIS — E1149 Type 2 diabetes mellitus with other diabetic neurological complication: Secondary | ICD-10-CM | POA: Diagnosis not present

## 2023-02-13 DIAGNOSIS — F330 Major depressive disorder, recurrent, mild: Secondary | ICD-10-CM | POA: Diagnosis not present

## 2023-02-13 DIAGNOSIS — E782 Mixed hyperlipidemia: Secondary | ICD-10-CM | POA: Diagnosis not present

## 2023-02-13 DIAGNOSIS — F411 Generalized anxiety disorder: Secondary | ICD-10-CM | POA: Diagnosis not present

## 2023-02-23 DIAGNOSIS — I10 Essential (primary) hypertension: Secondary | ICD-10-CM | POA: Diagnosis not present

## 2023-02-23 DIAGNOSIS — K219 Gastro-esophageal reflux disease without esophagitis: Secondary | ICD-10-CM | POA: Diagnosis not present

## 2023-02-23 DIAGNOSIS — E1149 Type 2 diabetes mellitus with other diabetic neurological complication: Secondary | ICD-10-CM | POA: Diagnosis not present

## 2023-03-05 DIAGNOSIS — M797 Fibromyalgia: Secondary | ICD-10-CM | POA: Diagnosis not present

## 2023-03-05 DIAGNOSIS — R159 Full incontinence of feces: Secondary | ICD-10-CM | POA: Diagnosis not present

## 2023-03-05 DIAGNOSIS — G894 Chronic pain syndrome: Secondary | ICD-10-CM | POA: Diagnosis not present

## 2023-03-05 DIAGNOSIS — N3946 Mixed incontinence: Secondary | ICD-10-CM | POA: Diagnosis not present

## 2023-03-14 DIAGNOSIS — R5383 Other fatigue: Secondary | ICD-10-CM | POA: Diagnosis not present

## 2023-03-14 DIAGNOSIS — R059 Cough, unspecified: Secondary | ICD-10-CM | POA: Diagnosis not present

## 2023-03-14 DIAGNOSIS — I10 Essential (primary) hypertension: Secondary | ICD-10-CM | POA: Diagnosis not present

## 2023-03-14 DIAGNOSIS — R1310 Dysphagia, unspecified: Secondary | ICD-10-CM | POA: Diagnosis not present

## 2023-03-14 DIAGNOSIS — R0989 Other specified symptoms and signs involving the circulatory and respiratory systems: Secondary | ICD-10-CM | POA: Diagnosis not present

## 2023-03-15 DIAGNOSIS — N390 Urinary tract infection, site not specified: Secondary | ICD-10-CM | POA: Diagnosis not present

## 2023-03-16 DIAGNOSIS — R1310 Dysphagia, unspecified: Secondary | ICD-10-CM | POA: Diagnosis not present

## 2023-03-16 DIAGNOSIS — F330 Major depressive disorder, recurrent, mild: Secondary | ICD-10-CM | POA: Diagnosis not present

## 2023-03-16 DIAGNOSIS — F411 Generalized anxiety disorder: Secondary | ICD-10-CM | POA: Diagnosis not present

## 2023-03-16 DIAGNOSIS — R5383 Other fatigue: Secondary | ICD-10-CM | POA: Diagnosis not present

## 2023-03-16 DIAGNOSIS — E1149 Type 2 diabetes mellitus with other diabetic neurological complication: Secondary | ICD-10-CM | POA: Diagnosis not present

## 2023-03-16 DIAGNOSIS — E782 Mixed hyperlipidemia: Secondary | ICD-10-CM | POA: Diagnosis not present

## 2023-03-17 DIAGNOSIS — E119 Type 2 diabetes mellitus without complications: Secondary | ICD-10-CM | POA: Diagnosis not present

## 2023-03-17 DIAGNOSIS — I10 Essential (primary) hypertension: Secondary | ICD-10-CM | POA: Diagnosis not present

## 2023-03-17 DIAGNOSIS — N390 Urinary tract infection, site not specified: Secondary | ICD-10-CM | POA: Diagnosis not present

## 2023-03-17 DIAGNOSIS — D649 Anemia, unspecified: Secondary | ICD-10-CM | POA: Diagnosis not present

## 2023-03-19 DIAGNOSIS — N390 Urinary tract infection, site not specified: Secondary | ICD-10-CM | POA: Diagnosis not present

## 2023-03-19 DIAGNOSIS — F330 Major depressive disorder, recurrent, mild: Secondary | ICD-10-CM | POA: Diagnosis not present

## 2023-03-27 DIAGNOSIS — F330 Major depressive disorder, recurrent, mild: Secondary | ICD-10-CM | POA: Diagnosis not present

## 2023-03-27 DIAGNOSIS — B360 Pityriasis versicolor: Secondary | ICD-10-CM | POA: Diagnosis not present

## 2023-03-27 DIAGNOSIS — M797 Fibromyalgia: Secondary | ICD-10-CM | POA: Diagnosis not present

## 2023-03-27 DIAGNOSIS — N390 Urinary tract infection, site not specified: Secondary | ICD-10-CM | POA: Diagnosis not present

## 2023-03-30 DIAGNOSIS — N390 Urinary tract infection, site not specified: Secondary | ICD-10-CM | POA: Diagnosis not present

## 2023-03-31 DIAGNOSIS — N390 Urinary tract infection, site not specified: Secondary | ICD-10-CM | POA: Diagnosis not present

## 2023-04-02 DIAGNOSIS — M797 Fibromyalgia: Secondary | ICD-10-CM | POA: Diagnosis not present

## 2023-04-02 DIAGNOSIS — N390 Urinary tract infection, site not specified: Secondary | ICD-10-CM | POA: Diagnosis not present

## 2023-04-02 DIAGNOSIS — I10 Essential (primary) hypertension: Secondary | ICD-10-CM | POA: Diagnosis not present

## 2023-04-05 DIAGNOSIS — R159 Full incontinence of feces: Secondary | ICD-10-CM | POA: Diagnosis not present

## 2023-04-05 DIAGNOSIS — N3946 Mixed incontinence: Secondary | ICD-10-CM | POA: Diagnosis not present

## 2023-04-05 DIAGNOSIS — G894 Chronic pain syndrome: Secondary | ICD-10-CM | POA: Diagnosis not present

## 2023-04-05 DIAGNOSIS — M797 Fibromyalgia: Secondary | ICD-10-CM | POA: Diagnosis not present

## 2023-04-16 DIAGNOSIS — B360 Pityriasis versicolor: Secondary | ICD-10-CM | POA: Diagnosis not present

## 2023-04-16 DIAGNOSIS — I2510 Atherosclerotic heart disease of native coronary artery without angina pectoris: Secondary | ICD-10-CM | POA: Diagnosis not present

## 2023-04-16 DIAGNOSIS — E785 Hyperlipidemia, unspecified: Secondary | ICD-10-CM | POA: Diagnosis not present

## 2023-04-19 DIAGNOSIS — R627 Adult failure to thrive: Secondary | ICD-10-CM | POA: Diagnosis not present

## 2023-04-19 DIAGNOSIS — I10 Essential (primary) hypertension: Secondary | ICD-10-CM | POA: Diagnosis not present

## 2023-04-19 DIAGNOSIS — M797 Fibromyalgia: Secondary | ICD-10-CM | POA: Diagnosis not present

## 2023-04-19 DIAGNOSIS — L309 Dermatitis, unspecified: Secondary | ICD-10-CM | POA: Diagnosis not present

## 2023-04-24 DIAGNOSIS — I2510 Atherosclerotic heart disease of native coronary artery without angina pectoris: Secondary | ICD-10-CM | POA: Diagnosis not present

## 2023-04-24 DIAGNOSIS — N3281 Overactive bladder: Secondary | ICD-10-CM | POA: Diagnosis not present

## 2023-04-24 DIAGNOSIS — I10 Essential (primary) hypertension: Secondary | ICD-10-CM | POA: Diagnosis not present

## 2023-04-27 DIAGNOSIS — B360 Pityriasis versicolor: Secondary | ICD-10-CM | POA: Diagnosis not present

## 2023-04-27 DIAGNOSIS — I2510 Atherosclerotic heart disease of native coronary artery without angina pectoris: Secondary | ICD-10-CM | POA: Diagnosis not present

## 2023-04-27 DIAGNOSIS — E785 Hyperlipidemia, unspecified: Secondary | ICD-10-CM | POA: Diagnosis not present

## 2023-04-27 DIAGNOSIS — I10 Essential (primary) hypertension: Secondary | ICD-10-CM | POA: Diagnosis not present

## 2023-04-27 DIAGNOSIS — F330 Major depressive disorder, recurrent, mild: Secondary | ICD-10-CM | POA: Diagnosis not present

## 2023-04-27 DIAGNOSIS — M797 Fibromyalgia: Secondary | ICD-10-CM | POA: Diagnosis not present

## 2023-05-03 DIAGNOSIS — N3946 Mixed incontinence: Secondary | ICD-10-CM | POA: Diagnosis not present

## 2023-05-03 DIAGNOSIS — G894 Chronic pain syndrome: Secondary | ICD-10-CM | POA: Diagnosis not present

## 2023-05-03 DIAGNOSIS — M797 Fibromyalgia: Secondary | ICD-10-CM | POA: Diagnosis not present

## 2023-05-03 DIAGNOSIS — R159 Full incontinence of feces: Secondary | ICD-10-CM | POA: Diagnosis not present

## 2023-05-09 DIAGNOSIS — N390 Urinary tract infection, site not specified: Secondary | ICD-10-CM | POA: Diagnosis not present

## 2023-05-10 DIAGNOSIS — F330 Major depressive disorder, recurrent, mild: Secondary | ICD-10-CM | POA: Diagnosis not present

## 2023-08-14 DEATH — deceased
